# Patient Record
Sex: Female | Born: 1985 | Race: Black or African American | Hispanic: No | Marital: Single | Smoking: Never smoker
Health system: Southern US, Community
[De-identification: ages and names within clinical notes are randomized; demographics above are authoritative.]

## PROBLEM LIST (undated history)

## (undated) DIAGNOSIS — G8929 Other chronic pain: Secondary | ICD-10-CM

## (undated) DIAGNOSIS — K219 Gastro-esophageal reflux disease without esophagitis: Secondary | ICD-10-CM

## (undated) DIAGNOSIS — M545 Low back pain, unspecified: Secondary | ICD-10-CM

## (undated) DIAGNOSIS — R519 Headache, unspecified: Secondary | ICD-10-CM

## (undated) DIAGNOSIS — R51 Headache: Secondary | ICD-10-CM

## (undated) DIAGNOSIS — I10 Essential (primary) hypertension: Secondary | ICD-10-CM

## (undated) DIAGNOSIS — E669 Obesity, unspecified: Secondary | ICD-10-CM

## (undated) DIAGNOSIS — J45909 Unspecified asthma, uncomplicated: Secondary | ICD-10-CM

## (undated) DIAGNOSIS — L97309 Non-pressure chronic ulcer of unspecified ankle with unspecified severity: Secondary | ICD-10-CM

## (undated) DIAGNOSIS — Q210 Ventricular septal defect: Secondary | ICD-10-CM

## (undated) DIAGNOSIS — I8390 Asymptomatic varicose veins of unspecified lower extremity: Secondary | ICD-10-CM

## (undated) HISTORY — PX: TONSILLECTOMY AND ADENOIDECTOMY: SUR1326

## (undated) HISTORY — PX: TYMPANOSTOMY TUBE PLACEMENT: SHX32

## (undated) HISTORY — PX: HERNIA REPAIR: SHX51

## (undated) HISTORY — DX: Non-pressure chronic ulcer of unspecified ankle with unspecified severity: L97.309

---
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
## 2005-07-11 ENCOUNTER — Ambulatory Visit (HOSPITAL_COMMUNITY): Admission: RE | Admit: 2005-07-11 | Discharge: 2005-07-11 | Payer: Self-pay | Admitting: Obstetrics & Gynecology

## 2005-12-10 ENCOUNTER — Emergency Department (HOSPITAL_COMMUNITY): Admission: EM | Admit: 2005-12-10 | Discharge: 2005-12-10 | Payer: Self-pay | Admitting: Emergency Medicine

## 2005-12-11 ENCOUNTER — Emergency Department (HOSPITAL_COMMUNITY): Admission: EM | Admit: 2005-12-11 | Discharge: 2005-12-11 | Payer: Self-pay | Admitting: *Deleted

## 2009-12-06 ENCOUNTER — Emergency Department (HOSPITAL_COMMUNITY): Admission: EM | Admit: 2009-12-06 | Discharge: 2009-12-06 | Payer: Self-pay | Admitting: Family Medicine

## 2010-06-29 LAB — POCT URINALYSIS DIPSTICK
Bilirubin Urine: NEGATIVE
Glucose, UA: NEGATIVE mg/dL
Ketones, ur: NEGATIVE mg/dL

## 2010-06-29 LAB — POCT PREGNANCY, URINE: Preg Test, Ur: NEGATIVE

## 2010-06-29 LAB — URINE CULTURE: Culture  Setup Time: 201108231918

## 2011-01-31 ENCOUNTER — Emergency Department (HOSPITAL_COMMUNITY)
Admission: EM | Admit: 2011-01-31 | Discharge: 2011-01-31 | Disposition: A | Discharge status: Home / Self Care | Payer: Self-pay | Attending: Emergency Medicine | Admitting: Emergency Medicine

## 2011-01-31 DIAGNOSIS — L02419 Cutaneous abscess of limb, unspecified: Secondary | ICD-10-CM | POA: Insufficient documentation

## 2011-01-31 DIAGNOSIS — E669 Obesity, unspecified: Secondary | ICD-10-CM | POA: Insufficient documentation

## 2011-01-31 DIAGNOSIS — M79609 Pain in unspecified limb: Secondary | ICD-10-CM | POA: Insufficient documentation

## 2011-01-31 LAB — GLUCOSE, CAPILLARY: Glucose-Capillary: 87 mg/dL (ref 70–99)

## 2011-07-29 ENCOUNTER — Encounter (HOSPITAL_COMMUNITY): Payer: Self-pay | Admitting: *Deleted

## 2011-07-29 ENCOUNTER — Emergency Department (HOSPITAL_COMMUNITY)
Admission: EM | Admit: 2011-07-29 | Discharge: 2011-07-29 | Disposition: A | Discharge status: Home / Self Care | Payer: Self-pay | Attending: Emergency Medicine | Admitting: Emergency Medicine

## 2011-07-29 DIAGNOSIS — H9201 Otalgia, right ear: Secondary | ICD-10-CM

## 2011-07-29 DIAGNOSIS — J3489 Other specified disorders of nose and nasal sinuses: Secondary | ICD-10-CM | POA: Insufficient documentation

## 2011-07-29 DIAGNOSIS — Z7982 Long term (current) use of aspirin: Secondary | ICD-10-CM | POA: Insufficient documentation

## 2011-07-29 DIAGNOSIS — H9209 Otalgia, unspecified ear: Secondary | ICD-10-CM | POA: Insufficient documentation

## 2011-07-29 HISTORY — DX: Obesity, unspecified: E66.9

## 2011-07-29 MED ORDER — AMOXICILLIN 500 MG PO CAPS: 1000.0000 mg | ORAL_CAPSULE | Freq: Three times a day (TID) | ORAL | Status: AC

## 2011-07-29 MED ORDER — ANTIPYRINE-BENZOCAINE 5.4-1.4 % OT SOLN
3.0000 [drp] | OTIC | Status: DC | PRN
  Administered 2011-07-29: 4 [drp] via OTIC
  Filled 2011-07-29: qty 10

## 2011-07-29 MED ORDER — AMOXICILLIN 500 MG PO CAPS
1000.0000 mg | ORAL_CAPSULE | Freq: Three times a day (TID) | ORAL | Status: DC
  Administered 2011-07-29: 1000 mg via ORAL
  Filled 2011-07-29: qty 2

## 2011-07-29 NOTE — ED Provider Notes (Signed)
Medical screening examination/treatment/procedure(s) were performed by non-physician practitioner and as supervising physician I was immediately available for consultation/collaboration.  Zaniah Titterington, MD 07/29/11 1519 

## 2011-07-29 NOTE — ED Notes (Signed)
Pt reports right ear pain x 1 week

## 2011-07-29 NOTE — ED Provider Notes (Signed)
History     CSN: [id number]  Arrival date & time 07/29/11  1478   First MD Initiated Contact with Patient 07/29/11 [phone]      Chief Complaint  Patient presents with  . Otalgia    (Consider location/radiation/quality/duration/timing/severity/associated sxs/prior treatment) Patient is a 26 y.o. female presenting with ear pain. The history is provided by the patient.  Otalgia This is a new problem. The current episode started more than 2 days ago. There is pain in the right ear. The problem occurs constantly. The problem has been gradually worsening. There has been no fever. Associated symptoms include rhinorrhea. Pertinent negatives include no ear discharge, no hearing loss, no sore throat and no cough.    Past Medical History  Diagnosis Date  . Obesity     History reviewed. No pertinent past surgical history.  History reviewed. No pertinent family history.  History  Substance Use Topics  . Smoking status: Not on file  . Smokeless tobacco: Not on file  . Alcohol Use: No    OB History    Grav Para Term Preterm Abortions TAB SAB Ect Mult Living                  Review of Systems  Constitutional: Negative.  Negative for fever.  HENT: Positive for ear pain and rhinorrhea. Negative for hearing loss, sore throat and ear discharge.   Eyes: Negative.   Respiratory: Negative.  Negative for cough.   Gastrointestinal: Negative.  Negative for nausea.  Neurological: Negative.  Negative for dizziness.    Allergies  Review of patient's allergies indicates no known allergies.  Home Medications   Current Outpatient Rx  Name Route Sig Dispense Refill  . ASPIRIN 325 MG PO TABS Oral Take 325 mg by mouth daily as needed. For pain      BP 103/48  Pulse 112  Temp(Src) 98.4 F (36.9 C) (Oral)  Resp 20  SpO2 99%  Physical Exam  Constitutional: She is oriented to person, place, and time. She appears well-developed and well-nourished.  HENT:  Head: Normocephalic and  atraumatic.  Left Ear: External ear normal.  Mouth/Throat: Oropharynx is clear and moist.       Right TM dull, erythematous prominences with slight bulging. No perforation. External canal normal.  Neck: Normal range of motion.  Pulmonary/Chest: Effort normal.  Neurological: She is alert and oriented to person, place, and time.  Skin: Skin is warm and dry.    ED Course  Procedures (including critical care time)  Labs Reviewed - No data to display No results found.   No diagnosis found. 1. Otalgia, right    MDM          Rodena Medin, PA-C 07/29/11 [phone]

## 2011-07-29 NOTE — Discharge Instructions (Signed)
USE DROPS FOR COMFORT EVERY 2-3 HOURS. TAKE AMOXIL AS DIRECTED AND FOLLOW UP WITH DR. BYERS IF PAIN DOES NOT RESOLVE IN 2-3 DAYS.  Otalgia The most common reason for this in children is an infection of the middle ear. Pain from the middle ear is usually caused by a build-up of fluid and pressure behind the eardrum. Pain from an earache can be sharp, dull, or burning. The pain may be temporary or constant. The middle ear is connected to the nasal passages by a short narrow tube called the Eustachian tube. The Eustachian tube allows fluid to drain out of the middle ear, and helps keep the pressure in your ear equalized. CAUSES  A cold or allergy can block the Eustachian tube with inflammation and the build-up of secretions. This is especially likely in small children, because their Eustachian tube is shorter and more horizontal. When the Eustachian tube closes, the normal flow of fluid from the middle ear is stopped. Fluid can accumulate and cause stuffiness, pain, hearing loss, and an ear infection if germs start growing in this area. SYMPTOMS  The symptoms of an ear infection may include fever, ear pain, fussiness, increased crying, and irritability. Many children will have temporary and minor hearing loss during and right after an ear infection. Permanent hearing loss is rare, but the risk increases the more infections a child has. Other causes of ear pain include retained water in the outer ear canal from swimming and bathing. Ear pain in adults is less likely to be from an ear infection. Ear pain may be referred from other locations. Referred pain may be from the joint between your jaw and the skull. It may also come from a tooth problem or problems in the neck. Other causes of ear pain include:  A foreign body in the ear.   Outer ear infection.   Sinus infections.   Impacted ear wax.   Ear injury.   Arthritis of the jaw or TMJ problems.   Middle ear infection.   Tooth infections.   Sore  throat with pain to the ears.  DIAGNOSIS  Your caregiver can usually make the diagnosis by examining you. Sometimes other special studies, including x-rays and lab work may be necessary. TREATMENT   If antibiotics were prescribed, use them as directed and finish them even if you or your child's symptoms seem to be improved.   Sometimes PE tubes are needed in children. These are little plastic tubes which are put into the eardrum during a simple surgical procedure. They allow fluid to drain easier and allow the pressure in the middle ear to equalize. This helps relieve the ear pain caused by pressure changes.  HOME CARE INSTRUCTIONS   Only take over-the-counter or prescription medicines for pain, discomfort, or fever as directed by your caregiver. DO NOT GIVE CHILDREN ASPIRIN because of the association of Reye's Syndrome in children taking aspirin.   Use a cold pack applied to the outer ear for 15 to 20 minutes, 3 to 4 times per day or as needed may reduce pain. Do not apply ice directly to the skin. You may cause frost bite.   Over-the-counter ear drops used as directed may be effective. Your caregiver may sometimes prescribe ear drops.   Resting in an upright position may help reduce pressure in the middle ear and relieve pain.   Ear pain caused by rapidly descending from high altitudes can be relieved by swallowing or chewing gum. Allowing infants to suck on a bottle  during airplane travel can help.   Do not smoke in the house or near children. If you are unable to quit smoking, smoke outside.   Control allergies.  SEEK IMMEDIATE MEDICAL CARE IF:   You or your child are becoming sicker.   Pain or fever relief is not obtained with medicine.   You or your child's symptoms (pain, fever, or irritability) do not improve within 24 to 48 hours or as instructed.   Severe pain suddenly stops hurting. This may indicate a ruptured eardrum.   You or your children develop new problems such as  severe headaches, stiff neck, difficulty swallowing, or swelling of the face or around the ear.  Document Released: 11/18/2003 Document Revised: 03/22/2011 Document Reviewed: 03/24/2008 [facility] Patient Information 2012 [place], [place].

## 2011-09-25 ENCOUNTER — Encounter (HOSPITAL_COMMUNITY): Payer: Self-pay | Admitting: *Deleted

## 2011-09-25 ENCOUNTER — Emergency Department (HOSPITAL_COMMUNITY)
Admission: EM | Admit: 2011-09-25 | Discharge: 2011-09-25 | Disposition: A | Discharge status: Home / Self Care | Payer: Self-pay | Attending: Emergency Medicine | Admitting: Emergency Medicine

## 2011-09-25 DIAGNOSIS — I83899 Varicose veins of unspecified lower extremities with other complications: Secondary | ICD-10-CM

## 2011-09-25 DIAGNOSIS — I868 Varicose veins of other specified sites: Secondary | ICD-10-CM | POA: Insufficient documentation

## 2011-09-25 NOTE — ED Provider Notes (Signed)
Medical screening examination/treatment/procedure(s) were performed by non-physician practitioner and as supervising physician I was immediately available for consultation/collaboration.   Bailie Christenbury B. Treyvin Glidden, MD 09/25/11 2127 

## 2011-09-25 NOTE — ED Provider Notes (Signed)
History     CSN: [id number]  Arrival date & time 09/25/11  8469   First MD Initiated Contact with Patient 09/25/11 0241      Chief Complaint  Patient presents with  . Foot Injury   HPI  History provided by the patient. Patient is a 26 year old female with morbid obesity who presents with complaints of bleeding from a varicose vein of right foot. Patient states that she may have bumped her foot in the shower and noticed that she began bleeding. Patient states bleeding was squirting out patient tried to put a bandage on it but was concerned for, which blood was coming. Patient called EMS and EMS applied a pressure bandage. Patient denies any pain. She denies any numbness of the foot. She denies any lightheadedness, fatigue, near syncope or shortness of breath. Symptoms are described as moderate. Patient denies any other associated symptoms.    Past Medical History  Diagnosis Date  . Obesity     History reviewed. No pertinent past surgical history.  No family history on file.  History  Substance Use Topics  . Smoking status: Not on file  . Smokeless tobacco: Not on file  . Alcohol Use: No    OB History    Grav Para Term Preterm Abortions TAB SAB Ect Mult Living                  Review of Systems  Constitutional: Negative for fatigue.  Respiratory: Negative for shortness of breath.   Skin:       Bleeding varicose vein  Neurological: Negative for light-headedness.    Allergies  Review of patient's allergies indicates no known allergies.  Home Medications  No current outpatient prescriptions on file.  BP 109/43  Pulse 106  Temp(Src) 98.4 F (36.9 C) (Oral)  Resp 20  SpO2 100%  Physical Exam  Nursing note and vitals reviewed. Constitutional: She appears well-developed and well-nourished. No distress.       Morbid obesity  HENT:  Head: Normocephalic.  Cardiovascular: Normal rate and regular rhythm.   Pulmonary/Chest: Effort normal and breath sounds normal.    Neurological: She is alert.  Skin: Skin is warm and dry.       Multiple varicose veins bilateral feet. There is evidence of bleeding to a small spot on the right lateral foot. At this time there is no active bleeding with good blood clot. Normal dorsal pedal pulses and feet. Normal sensation in toes.  Psychiatric: She has a normal mood and affect. Her behavior is normal.    ED Course  Procedures   Bleeding varicose vein REPAIR Performed by: Angus Seller Authorized by: Angus Seller Consent: Verbal consent obtained. Risks and benefits: risks, benefits and alternatives were discussed Consent given by: patient Patient identity confirmed: provided demographic data Prepped and Draped in normal sterile fashion Wound explored  Location: Lateral right foot  Length: 1 mm   Anesthesia: None    Amount of cleaning: standard, with alcohol   Skin closure: Skin with Dermabond    Patient tolerance: Patient tolerated the procedure well with no immediate complications.      1. Bleeding from varicose vein       MDM  Patient seen and evaluated. Patient no acute distress.        Angus Seller, [place] 09/25/11 2103

## 2011-09-25 NOTE — ED Notes (Signed)
Patient sts she was in the shower tonight when she saw her varicose vein was bleeding on her right foot. Patient sts she called EMS because she was scared. NAD, VSS.

## 2011-09-25 NOTE — Discharge Instructions (Signed)
You were seen and treated for your bleeding varicose vein. This was controlled with pressure. Your providers also placed Dermabond, a sterile glue over the site to prevent further bleeding. This will fall off on its own. It is recommended that you consider wearing compression stockings to help reduce your varicose veins. You should also try to elevate your feet off and to reduce varicose veins. Please followup with your primary care provider.    Bleeding Varicose Veins Varicose veins are veins that have become enlarged and twisted. Valves in the veins help return blood from the leg to the heart. If these valves are damaged, blood flows backwards and backs up into the veins in the leg near the skin. This causes the veins to become larger because of increased pressure within. Sometimes these veins bleed. CAUSES  Factors that can lead to bleeding varicose veins include:  Thinning of the skin that covers the veins. This skin is stretched as the veins enlarge.   Weak and thinning walls of the varicose veins. These thin walls are part of the reason why blood is not flowing normally to the heart.   Having high pressure in the veins. This high pressure occurs because the blood is not flowing freely back up to the heart.   Injury. Even a small injury to a varicose vein can cause bleeding.   Open wounds. A sore may develop near a varicose vein and not heal. This makes bleeding more likely.   Taking medicine that thins the blood. These medicines may include aspirin, anti-inflammatory medicine, and other blood thinners.  SYMPTOMS  If bleeding is on the outside surface of the skin, blood can be seen. Sometimes, the bleeding stays under the skin. If this happens, the blue or purple area will spread beyond the vein. This discoloration may be visible. DIAGNOSIS  To decide if you have a bleeding varicose vein, your caregiver may:  Ask about your symptoms. This will include when you first saw bleeding.    Ask about how long you have had varicose veins and if they cause you problems.   Ask about your overall health.   Ask about possible causes, like recent cuts or if the area near the varicose veins was bumped or injured.   Examine the skin or leg that concerns you. Your caregiver will probably feel the veins.   Order imaging tests. These create detailed pictures of the veins.  TREATMENT  The first goal of treating bleeding varicose veins is to stop the bleeding. Then, the aim is to keep any bleeding from happening again. Treatment will depend on the cause of the bleeding and how bad it is. Ask your caregiver about what would be best for you. Options include:  Raising (elevating) your leg. Lie down with your leg propped up on a pillow or cushion. Your foot should be above your heart.   Applying pressure to the spot that is bleeding. The bleeding should stop in a short time.   Wearing elastic stocking that "compress" your legs (compression stockings). An elastic bandage may do the same thing.   Applying an antibiotic cream on sores that are not healing.   Surgically removing or closing off the bleeding varicose veins.  HOME CARE INSTRUCTIONS   Apply any creams that your caregiver prescribed. Follow the directions carefully.   Wear compression stockings or any special wraps that were prescribed. Make sure you know:   If you should wear them every day.   How long you should wear  them.   If veins were removed or closed, a bandage (dressing) will probably cover the area. Make sure you know:   How often the dressing should be changed.   Whether the area can get wet.   When you can leave the skin uncovered.   Check your skin every day. Look for new sores and signs of bleeding.   To prevent future bleeding:   Use extra care in situations where you could cut your legs. Shaving, for example, or working outside in the garden.   Try to keep your legs elevated as much as possible.  Lie down when you can.  SEEK MEDICAL CARE IF:   You have any questions about how to wear compression stockings or elastic bandages.   Your veins continue to bleed.   Sores develop near your varicose veins.   You have a sore that does not heal or gets bigger.   Pain increases in your leg.   The area around a varicose vein becomes warm, red, or tender to the touch.   You notice a yellowish fluid that smells bad coming from a spot where there was bleeding.   You develop a fever of more than 100.5 F (38.1 C).  SEEK IMMEDIATE MEDICAL CARE IF:   You develop a fever of more than 102 F (38.9 C).  Document Released: 08/19/2008 Document Revised: 03/22/2011 Document Reviewed: 05/29/2010 [facility] Patient Information 2012 [place], [place].

## 2011-09-25 NOTE — ED Notes (Signed)
Per ems pt had a varicose vein in her right foot that started bleeding; presents with dressing placed by ems; no bleeding at present

## 2011-09-25 NOTE — ED Notes (Signed)
Patient given discharge instructions, information, prescriptions, and diet order. Patient states that they adequately understand discharge information given and to return to ED if symptoms return or worsen.    Foot was wrapped with gauze.

## 2011-10-24 ENCOUNTER — Encounter: Payer: Self-pay | Admitting: Vascular Surgery

## 2011-10-25 ENCOUNTER — Ambulatory Visit (INDEPENDENT_AMBULATORY_CARE_PROVIDER_SITE_OTHER): Payer: Medicaid Other | Admitting: Vascular Surgery

## 2011-10-25 ENCOUNTER — Ambulatory Visit (INDEPENDENT_AMBULATORY_CARE_PROVIDER_SITE_OTHER): Payer: Medicaid Other | Admitting: *Deleted

## 2011-10-25 ENCOUNTER — Encounter: Payer: Self-pay | Admitting: Vascular Surgery

## 2011-10-25 VITALS — BP 118/56 | HR 80 | Resp 16 | Ht 66.0 in | Wt 328.0 lb

## 2011-10-25 DIAGNOSIS — E669 Obesity, unspecified: Secondary | ICD-10-CM | POA: Insufficient documentation

## 2011-10-25 DIAGNOSIS — I83899 Varicose veins of unspecified lower extremities with other complications: Secondary | ICD-10-CM

## 2011-10-25 DIAGNOSIS — I83893 Varicose veins of bilateral lower extremities with other complications: Secondary | ICD-10-CM | POA: Insufficient documentation

## 2011-10-25 DIAGNOSIS — I8310 Varicose veins of unspecified lower extremity with inflammation: Secondary | ICD-10-CM

## 2011-10-25 NOTE — Progress Notes (Signed)
[facility] [place] HISTORY AND PHYSICAL   History of Present Illness:  Patient is a 26 y.o. year old female who presents for evaluation of varicose veins.  The patient had a bleeding episode from a varicose veins on her right foot several weeks ago. She was seen in the emergency department for this. She has had no bleeding episodes prior to or since then. She denies any history of DVT. She has never worn compression stockings. She states that varicosities and slowly developed over the last few years. Other medical problems include obesity.  She denies history of diabetes or hypertension. She is scheduled to have followup in the [facility] in the near future. She currently does not have an established are care physician. She is obese and works at [employer] has a tendency to overeat because the food is a readily available. She used to walk some but basically gets minimal exercise at this point.  Past Medical History  Diagnosis Date  . Obesity     History reviewed. No pertinent past surgical history.   Social History History  Substance Use Topics  . Smoking status: Never Smoker   . Smokeless tobacco: Never Used  . Alcohol Use: 0.6 oz/week    1 Glasses of wine per week     occasional    Family History History reviewed. No pertinent family history.  Allergies  No Known Allergies   No current outpatient prescriptions on file.    ROS:   General:  No weight loss, Fever, chills  HEENT: No recent headaches, no nasal bleeding, no visual changes, no sore throat  Neurologic: No dizziness, blackouts, seizures. No recent symptoms of stroke or mini- stroke. No recent episodes of slurred speech, or temporary blindness.  Cardiac: No recent episodes of chest pain/pressure, no shortness of breath at rest.  No shortness of breath with exertion.  Denies history of atrial fibrillation or irregular heartbeat  Vascular: No history of rest pain in feet.  No  history of claudication.  No history of non-healing ulcer, No history of DVT   Pulmonary: No home oxygen, no productive cough, no hemoptysis,  No asthma or wheezing  Musculoskeletal:  [ ]  Arthritis, [ ]  Low back pain,  [ ]  Joint pain  Hematologic:No history of hypercoagulable state.  No history of easy bleeding.  No history of anemia  Gastrointestinal: No hematochezia or melena,  No gastroesophageal reflux, no trouble swallowing  Urinary: [ ]  chronic Kidney disease, [ ]  on HD - [ ]  MWF or [ ]  TTHS, [ ]  Burning with urination, [ ]  Frequent urination, [ ]  Difficulty urinating;   Skin: No rashes  Psychological: No history of anxiety,  No history of depression   Physical Examination  Filed Vitals:   10/25/11 1026  BP: 118/56  Pulse: 80  Resp: 16  Height: 5\' 6"  (1.676 m)  Weight: 328 lb (148.78 kg)  SpO2: 100%    Body mass index is 52.94 kg/(m^2).  General:  Alert and oriented, no acute distress HEENT: Normal Neck: No bruit or JVD Pulmonary: Clear to auscultation bilaterally Cardiac: Regular Rate and Rhythm without murmur Abdomen: Soft, non-tender, non-distended, no mass, no scars, obese Skin: No rash, multiple scattered varicosities medial leg bilaterally and diffusely throughout both feet, no open ulcer, the varicosities range in size from 1-2 mm to up to 3-4 mm Extremity Pulses:  2+ radial, brachial, femoral, difficult to palpate dorsalis pedis, posterior tibial pulses bilaterally due to her obesity Musculoskeletal: No  deformity or edema  Neurologic: Upper and lower extremity motor 5/5 and symmetric  DATA: She had a venous reflux exam today which shows bilateral deep and superficial venous reflux. She had severe reflux of the deep system. She also has severe reflux of the superficial system.   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 Had lengthy discussion with the patient today regarding weight loss. I gave her several ideas for  weight loss as far as dietary recommendations as well as an exercise program of walking 30 minutes daily. She is going to give serious thought to weight loss over the next 6 months to a year and hopefully over the following years as this is a significant contributor to her varicose veins. She will continue followup regarding this with her primary care physician.   PLAN:  She was given a prescription today for bilateral thigh high compression stockings. She will wear these on daily basis. I have scheduled her for a followup visit with my partner Dr. Arbie Cookey in 3 months time to consider laser ablation. I will leave it at his discretion whether or not he thinks this would be beneficial since she does have fairly severe deep reflux as well. Hopefully with weight loss both of these symptoms will improve.  Fabienne Bruns, MD [facility] Office: [phone] Pager: [phone]

## 2011-11-02 NOTE — Procedures (Unsigned)
LOWER EXTREMITY VENOUS REFLUX EXAM  INDICATION:  Bilateral varicose veins with bleeding complications.  EXAM:  Using color-flow imaging and pulse Doppler spectral analysis, the right and left common femoral, femoral, popliteal, posterior tibial, greater and lesser saphenous veins are evaluated.  There is evidence suggesting deep venous insufficiency in the right and left lower extremity.  The right and left saphenofemoral junction is not competent with Reflux of >520milliseconds. The right and left GSV is not competent with Reflux of >56milliseconds with the caliber as described below.   The right and left proximal short saphenous vein demonstrates competency.  GSV Diameter (used if found to be incompetent only)                                           Right     Left Proximal Greater Saphenous Vein           1.56 cm   1.06 cm Proximal-to-mid-thigh                     0.93 cm   0.52 cm Mid thigh                                 0.86 cm   0.55 cm Mid-distal thigh                          Tortuous cm      0.62 cm Distal thigh                              cm        cm Knee                                      Tortuous cm      0.45 cm  IMPRESSION: 1. The right and left greater saphenous vein is not competent with     reflux of >500 milliseconds. 2. The right and left greater saphenous vein is tortuous in the mid to     distal thigh segment. 3. The deep venous system is not competent with Reflux of >500     milliseconds. 4. The right and left lesser saphenous vein is competent.  ___________________________________________ Janetta Hora. Fields, MD  EM/MEDQ  D:  10/26/2011  T:  10/26/2011  Job:  [id number]

## 2011-11-20 ENCOUNTER — Encounter (INDEPENDENT_AMBULATORY_CARE_PROVIDER_SITE_OTHER): Payer: Medicaid Other

## 2011-11-20 DIAGNOSIS — I83893 Varicose veins of bilateral lower extremities with other complications: Secondary | ICD-10-CM

## 2011-12-20 ENCOUNTER — Emergency Department (HOSPITAL_COMMUNITY)
Admission: EM | Admit: 2011-12-20 | Discharge: 2011-12-20 | Disposition: A | Discharge status: Home / Self Care | Payer: Medicaid Other | Source: Home / Self Care | Attending: Family Medicine | Admitting: Family Medicine

## 2011-12-20 ENCOUNTER — Encounter (HOSPITAL_COMMUNITY): Payer: Self-pay | Admitting: Emergency Medicine

## 2011-12-20 DIAGNOSIS — IMO0002 Reserved for concepts with insufficient information to code with codable children: Secondary | ICD-10-CM

## 2011-12-20 DIAGNOSIS — S8392XA Sprain of unspecified site of left knee, initial encounter: Secondary | ICD-10-CM

## 2011-12-20 HISTORY — DX: Asymptomatic varicose veins of unspecified lower extremity: I83.90

## 2011-12-20 MED ORDER — CYCLOBENZAPRINE HCL 10 MG PO TABS: 10.0000 mg | ORAL_TABLET | Freq: Two times a day (BID) | ORAL | Status: DC | PRN

## 2011-12-20 MED ORDER — CYCLOBENZAPRINE HCL 10 MG PO TABS: 10.0000 mg | ORAL_TABLET | Freq: Two times a day (BID) | ORAL | Status: AC | PRN

## 2011-12-20 MED ORDER — TRAMADOL HCL 50 MG PO TABS: 50.0000 mg | ORAL_TABLET | Freq: Four times a day (QID) | ORAL | Status: DC | PRN

## 2011-12-20 MED ORDER — TRAMADOL HCL 50 MG PO TABS: 50.0000 mg | ORAL_TABLET | Freq: Four times a day (QID) | ORAL | Status: AC | PRN

## 2011-12-20 MED ORDER — NAPROXEN 500 MG PO TABS: 500.0000 mg | ORAL_TABLET | Freq: Two times a day (BID) | ORAL | Status: DC

## 2011-12-20 NOTE — ED Notes (Signed)
Patient stepped awkwardly down one step late Saturday night, noticed pain immediately.  Pain has continued .  Left knee is painful.  Slight improvement in pain.

## 2011-12-20 NOTE — ED Provider Notes (Signed)
History     CSN: [id number]  Arrival date & time 12/20/11  [place]   First MD Initiated Contact with Patient 12/20/11 1914      Chief Complaint  Patient presents with  . Knee Pain    (Consider location/radiation/quality/duration/timing/severity/associated sxs/prior treatment) HPI Comments: 26 year old female morbidly obese. Here complaining of left knee pain for the last 4 days. Patient is states that she stepped awkwardly one step while going down stairs in her porch 4 days ago, experiencing pain immediately and lateral lower left knee. Pain mildly improved but persistent and constant. Denies fall or directly hitting her left knee. Has been able to walk and put weight on her left lower extremity but reports discomfort with walking standing, as well as with bending her left knee. No associated redness or swelling. Has taken 1 day or status during this morning with minimal improvement. No fever or chills.   Past Medical History  Diagnosis Date  . Obesity   . Varicose veins     Past Surgical History  Procedure Date  . Tonsillectomy     No family history on file.  History  Substance Use Topics  . Smoking status: Never Smoker   . Smokeless tobacco: Never Used  . Alcohol Use: 0.6 oz/week    1 Glasses of wine per week     occasional    OB History    Grav Para Term Preterm Abortions TAB SAB Ect Mult Living                  Review of Systems  Constitutional: Negative for fever and chills.  Musculoskeletal:       As HPI  Skin: Negative for rash and wound.  All other systems reviewed and are negative.    Allergies  Review of patient's allergies indicates no known allergies.  Home Medications   Current Outpatient Rx  Name Route Sig Dispense Refill  . ACETAMINOPHEN 325 MG PO TABS Oral Take 650 mg by mouth every 6 (six) hours as needed.    . CYCLOBENZAPRINE HCL 10 MG PO TABS Oral Take 1 tablet (10 mg total) by mouth 2 (two) times daily as needed for muscle spasms. 20  tablet 0  . NAPROXEN 500 MG PO TABS Oral Take 1 tablet (500 mg total) by mouth 2 (two) times daily. 30 tablet 0  . TRAMADOL HCL 50 MG PO TABS Oral Take 1 tablet (50 mg total) by mouth every 6 (six) hours as needed for pain. 15 tablet 0    BP 100/53  Pulse 84  Temp 98.8 F (37.1 C) (Oral)  Resp 16  SpO2 100%  Physical Exam  Nursing note and vitals reviewed. Constitutional: She is oriented to person, place, and time.       Morbid obesity  HENT:  Head: Normocephalic and atraumatic.  Cardiovascular: Normal heart sounds.   Pulmonary/Chest: Breath sounds normal.  Musculoskeletal:       Left knee: morbid obese knee. Patient is weight bearing on both extremities. Walking with no assistance and no limping. No obvious deformity no skin erythema or increased temperature. No obvious effusion. No focal tenderness on palpation. Reported tenderness in lateral knee with extreme varus maneuver. Impress no hyperlaxity or unestability.  No obvious baker's cyst or masses.    Neurological: She is alert and oriented to person, place, and time.  Skin:       varicose veins and dry peeling skin in medial lower legs bilaterally. No skin brakes.  ED Course  Procedures (including critical care time)  Labs Reviewed - No data to display No results found.   1. Left knee sprain       MDM  Treated with ace wrap, naproxen, tramadol and Flexeril. Knee strengthening exercises discussed with patient and provided in writing. Encourage floor, sit down exercises or water aerobics for weight management to avoid over stress of low extremity joints. Asked to followup with the orthopedic specialist if persistent pain after 1 week or return earlier if worsening symptoms despite following treatment.        Sharin Grave, MD 12/21/11 1556

## 2012-01-16 ENCOUNTER — Emergency Department (HOSPITAL_COMMUNITY)
Admission: EM | Admit: 2012-01-16 | Discharge: 2012-01-16 | Disposition: A | Discharge status: Home / Self Care | Payer: Medicaid Other | Source: Home / Self Care | Attending: Family Medicine | Admitting: Family Medicine

## 2012-01-16 ENCOUNTER — Encounter (HOSPITAL_COMMUNITY): Payer: Self-pay | Admitting: *Deleted

## 2012-01-16 DIAGNOSIS — R1013 Epigastric pain: Secondary | ICD-10-CM

## 2012-01-16 LAB — POCT URINALYSIS DIP (DEVICE)
Ketones, ur: NEGATIVE mg/dL
Protein, ur: NEGATIVE mg/dL
Specific Gravity, Urine: >=1.03 (ref 1.005–1.030)
pH: 5.5 (ref 5.0–8.0)

## 2012-01-16 MED ORDER — RANITIDINE HCL 150 MG PO CAPS: 150.0000 mg | ORAL_CAPSULE | Freq: Every day | ORAL | Status: DC

## 2012-01-16 MED ORDER — GI COCKTAIL ~~LOC~~
30.0000 mL | Freq: Once | ORAL | Status: AC
  Administered 2012-01-16: 30 mL via ORAL

## 2012-01-16 MED ORDER — GI COCKTAIL ~~LOC~~
ORAL | Status: AC
  Filled 2012-01-16: qty 30

## 2012-01-16 NOTE — ED Notes (Signed)
C/o epigastric pain onset 1330. Ate a salad at work at 1030.  No nausea, vomiting or diarrhea.    She was hot and sweating. She went into th freezer to cool off and pain came on suddenly.  Went to BR to urinate and no relief. She took 3 Advil @ 1400 without relief.  Pain stopped at 1740.

## 2012-01-16 NOTE — ED Notes (Signed)
She wants a pregnancy test just to find out.  States she does not know when her LMP was.  She was on birth control to regulate her periods.  Has been off it for 6-7 years.  Periods are irregular.

## 2012-01-16 NOTE — ED Provider Notes (Signed)
History     CSN: [id number]  Arrival date & time 01/16/12  1712   None     Chief Complaint  Patient presents with  . Abdominal Pain    (Consider location/radiation/quality/duration/timing/severity/associated sxs/prior treatment) Patient is a 26 y.o. female presenting with abdominal pain. The history is provided by the patient.  Abdominal Pain The primary symptoms of the illness include abdominal pain. The primary symptoms of the illness do not include nausea, vomiting or diarrhea. The current episode started 1 to 2 hours ago. The onset of the illness was gradual. The problem has been gradually worsening.  Pregnant now: unknown. The patient has not had a change in bowel habit. Risk factors: none.  Epigastric pain, intermittent, not associated with food intake, reports hx of same.  Admits to heartburn symptoms intermittently.  States no medications taken for same.    Past Medical History  Diagnosis Date  . Obesity   . Varicose veins     Past Surgical History  Procedure Date  . Tonsillectomy     Family History  Problem Relation Age of Onset  . Diabetes Mother     History  Substance Use Topics  . Smoking status: Never Smoker   . Smokeless tobacco: Never Used  . Alcohol Use: 0.6 oz/week    1 Glasses of wine per week     occasional    OB History    Grav Para Term Preterm Abortions TAB SAB Ect Mult Living                  Review of Systems  Constitutional: Negative.   Respiratory: Negative.   Cardiovascular: Negative.   Gastrointestinal: Positive for abdominal pain. Negative for nausea, vomiting and diarrhea.  Genitourinary: Negative.     Allergies  Review of patient's allergies indicates no known allergies.  Home Medications   Current Outpatient Rx  Name Route Sig Dispense Refill  . IBUPROFEN 200 MG PO TABS Oral Take 600 mg by mouth every 6 (six) hours as needed.    Marland Kitchen NAPROXEN 500 MG PO TABS Oral Take 1 tablet (500 mg total) by mouth 2 (two) times daily.  30 tablet 0  . ACETAMINOPHEN 325 MG PO TABS Oral Take 650 mg by mouth every 6 (six) hours as needed.    Marland Kitchen RANITIDINE HCL 150 MG PO CAPS Oral Take 1 capsule (150 mg total) by mouth daily. 60 capsule 2    BP 100/62  Pulse 86  Temp 98.7 F (37.1 C) (Oral)  Resp 16  SpO2 100%  Physical Exam  Nursing note and vitals reviewed. Constitutional: She is oriented to person, place, and time. Vital signs are normal. She appears well-developed and well-nourished. She is active and cooperative.  HENT:  Head: Normocephalic.  Mouth/Throat: Oropharynx is clear and moist.  Eyes: Conjunctivae normal are normal. Pupils are equal, round, and reactive to light. No scleral icterus.  Neck: Trachea normal. Neck supple.  Cardiovascular: Normal rate, regular rhythm, normal heart sounds and intact distal pulses.   No murmur heard. Pulmonary/Chest: Effort normal and breath sounds normal.  Abdominal: Soft. Bowel sounds are normal. There is no tenderness. There is no rebound and no guarding.  Neurological: She is alert and oriented to person, place, and time. No cranial nerve deficit or sensory deficit.  Skin: Skin is warm and dry.  Psychiatric: She has a normal mood and affect. Her speech is normal and behavior is normal. Judgment and thought content normal. Cognition and memory are normal.  ED Course  Procedures (including critical care time)  Labs Reviewed  POCT URINALYSIS DIP (DEVICE) - Abnormal; Notable for the following:    Hgb urine dipstick SMALL (*)     All other components within normal limits  POCT PREGNANCY, URINE  LAB REPORT - SCANNED   No results found.   1. Epigastric pain       MDM  GI cocktail administered in office.  Take medications as prescribed.        Johnsie Kindred, NP 01/21/12 2113

## 2012-01-22 NOTE — ED Provider Notes (Signed)
Medical screening examination/treatment/procedure(s) were performed by resident physician or non-physician practitioner and as supervising physician I was immediately available for consultation/collaboration.   Tremon Sainvil DOUGLAS MD.    Maeola Mchaney D Rufina Kimery, MD 01/22/12 1032 

## 2012-02-04 ENCOUNTER — Encounter: Payer: Self-pay | Admitting: Vascular Surgery

## 2012-02-05 ENCOUNTER — Ambulatory Visit: Payer: Medicaid Other | Admitting: Vascular Surgery

## 2012-02-07 ENCOUNTER — Ambulatory Visit: Payer: Medicaid Other | Admitting: Vascular Surgery

## 2012-02-18 ENCOUNTER — Encounter: Payer: Self-pay | Admitting: Vascular Surgery

## 2012-02-19 ENCOUNTER — Encounter: Payer: Self-pay | Admitting: Vascular Surgery

## 2012-02-19 ENCOUNTER — Ambulatory Visit (INDEPENDENT_AMBULATORY_CARE_PROVIDER_SITE_OTHER): Payer: Medicaid Other | Admitting: Vascular Surgery

## 2012-02-19 ENCOUNTER — Telehealth: Payer: Self-pay | Admitting: *Deleted

## 2012-02-19 VITALS — BP 128/84 | HR 94 | Resp 18 | Ht 66.0 in | Wt 332.2 lb

## 2012-02-19 DIAGNOSIS — I83893 Varicose veins of bilateral lower extremities with other complications: Secondary | ICD-10-CM

## 2012-02-19 NOTE — Progress Notes (Signed)
Problems with Activities of Daily Living Secondary to Leg Pain  1. 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  2. [name] states that any activity that requires prolonged standing (cooking, cleaning, shopping) is difficult due to leg pain.     Failure of  Conservative Therapy:  1. Worn 20-30 mm Hg thigh high compression hose >3 months with no relief of symptoms.  2. Frequently elevates legs-no relief of symptoms  3. Taken Ibuprofen 600 Mg TID with no relief of symptoms.  The patient has today for further discussion of her bilateral venous hypertension. This is worse on the right than on the left. He fortunately has had no further bleeding issues. She does continue to have swelling and discomfort related to her venous hypertension.  Physical exam: Morbidly obese female in no acute distress. She does have marked varicosities from her mid thighs distally bilaterally more so on the right on the left. She does have extensive changes of venous hypertension around both ankles with hemosiderin deposit and announced telangiectasia where she has had bleeding before on the right. I reimaged her right leg with sinusitis does show reflux and a very large saphenous vein extending into these large varicosities.  She reports at the right side is worse than the left. I do not feel that she is a candidate for laser ablation due to her obesity and proximal nature of the level of the saphenous vein disease. She also has large saphenous vein which I feel would make her at increased risk for failure of laser ablation. I feel that appropriate treatment would be ligation stripping of her right great saphenous vein and stab phlebectomy of her multiple to tributary varicosities on her thigh and calf. She understands as an outpatient procedure under general anesthesia. We will schedule this at her convenience

## 2012-02-19 NOTE — Telephone Encounter (Signed)
Left detailed telephone voice message on her personal cell phone (with patient's permission) informing [name] that her current Medicaid coverage only covers family planning (birth control) and would not cover the ligation and stripping/ stab phlebectomy procedures that Dr. Arbie Cookey was recommending.  Suggested that she talk to a Medicaid worker when she reapplies for a new Medicaid card (current one expires 03-25-2012) to apply for broader coverage that would include medical and surgical care.  Requested that she call me back after she receives new Medicaid card if she can obtain coverage for surgical procedures.

## 2012-04-23 ENCOUNTER — Encounter (HOSPITAL_COMMUNITY): Payer: Self-pay | Admitting: Emergency Medicine

## 2012-04-23 ENCOUNTER — Emergency Department (HOSPITAL_COMMUNITY)
Admission: EM | Admit: 2012-04-23 | Discharge: 2012-04-23 | Disposition: A | Discharge status: Home / Self Care | Payer: Self-pay | Attending: Emergency Medicine | Admitting: Emergency Medicine

## 2012-04-23 DIAGNOSIS — R6889 Other general symptoms and signs: Secondary | ICD-10-CM

## 2012-04-23 DIAGNOSIS — R059 Cough, unspecified: Secondary | ICD-10-CM | POA: Insufficient documentation

## 2012-04-23 DIAGNOSIS — J3489 Other specified disorders of nose and nasal sinuses: Secondary | ICD-10-CM | POA: Insufficient documentation

## 2012-04-23 DIAGNOSIS — R05 Cough: Secondary | ICD-10-CM | POA: Insufficient documentation

## 2012-04-23 DIAGNOSIS — R110 Nausea: Secondary | ICD-10-CM | POA: Insufficient documentation

## 2012-04-23 DIAGNOSIS — E669 Obesity, unspecified: Secondary | ICD-10-CM | POA: Insufficient documentation

## 2012-04-23 DIAGNOSIS — J069 Acute upper respiratory infection, unspecified: Secondary | ICD-10-CM | POA: Insufficient documentation

## 2012-04-23 DIAGNOSIS — R52 Pain, unspecified: Secondary | ICD-10-CM | POA: Insufficient documentation

## 2012-04-23 DIAGNOSIS — IMO0001 Reserved for inherently not codable concepts without codable children: Secondary | ICD-10-CM | POA: Insufficient documentation

## 2012-04-23 MED ORDER — OSELTAMIVIR PHOSPHATE 75 MG PO CAPS: 75.0000 mg | ORAL_CAPSULE | Freq: Two times a day (BID) | ORAL | Status: DC

## 2012-04-23 MED ORDER — IBUPROFEN 800 MG PO TABS
800.0000 mg | ORAL_TABLET | Freq: Once | ORAL | Status: AC
  Administered 2012-04-23: 800 mg via ORAL
  Filled 2012-04-23: qty 1

## 2012-04-23 NOTE — ED Provider Notes (Signed)
History   This chart was scribed for Johnnette Gourd, PA-C working with Celene Kras, MD by Charolett Bumpers, ED Scribe. This patient was seen in room WTR6/WTR6 and the patient's care was started at 1733.   CSN: [id number]  Arrival date & time 04/23/12  4098   First MD Initiated Contact with Patient 04/23/12 1733      Chief Complaint  Patient presents with  . Fever  . Cough  . Generalized Body Aches    The history is provided by the patient. No language interpreter was used.   [name] is a 27 y.o. female who presents to the Emergency Department complaining of constant, moderate fever with associated generalized body aches, chills, dry cough, nausea and congestion that started yesterday. She denies any vomiting, diarrhea. She denies any sick contacts and states that she has not had a flu vaccine. She states that she took alka-seltzer plus and mucinex with minimal relief.    Past Medical History  Diagnosis Date  . Obesity   . Varicose veins     Past Surgical History  Procedure Date  . Tonsillectomy     Family History  Problem Relation Age of Onset  . Diabetes Mother     History  Substance Use Topics  . Smoking status: Never Smoker   . Smokeless tobacco: Never Used  . Alcohol Use: 0.6 oz/week    1 Glasses of wine per week     Comment: occasional    OB History    Grav Para Term Preterm Abortions TAB SAB Ect Mult Living                  Review of Systems  Constitutional: Positive for fever and chills.  HENT: Positive for congestion.   Respiratory: Positive for cough.   Gastrointestinal: Positive for nausea. Negative for vomiting and diarrhea.  Musculoskeletal: Positive for myalgias.  All other systems reviewed and are negative.    Allergies  Review of patient's allergies indicates no known allergies.  Home Medications  No current outpatient prescriptions on file.  BP 124/96  Pulse 118  Temp 101.7 F (38.7 C) (Oral)  Resp 20  SpO2 100%  LMP  04/23/2012  Physical Exam  Nursing note and vitals reviewed. Constitutional: She is oriented to person, place, and time. She appears well-developed and well-nourished. No distress.       Obese  HENT:  Head: Normocephalic and atraumatic.  Nose: Mucosal edema present.  Mouth/Throat: Posterior oropharyngeal erythema present. No oropharyngeal exudate or posterior oropharyngeal edema.       Nasal congestion and mucosal edema on exam.   Eyes: Conjunctivae normal and EOM are normal.  Neck: Normal range of motion. Neck supple. No tracheal deviation present.  Cardiovascular: Regular rhythm and normal heart sounds.  Tachycardia present.   Pulmonary/Chest: Effort normal and breath sounds normal. No respiratory distress.  Abdominal: Soft. There is no tenderness.  Musculoskeletal: Normal range of motion.  Lymphadenopathy:    She has no cervical adenopathy.  Neurological: She is alert and oriented to person, place, and time.  Skin: Skin is warm and dry.       Very warm to touch.   Psychiatric: She has a normal mood and affect. Her behavior is normal.    ED Course  Procedures (including critical care time)  DIAGNOSTIC STUDIES: Oxygen Saturation is 100% on room air, normal by my interpretation.    COORDINATION OF CARE:  17:41-Discussed planned course of treatment with the patient including  Tamiflu, alternating Tylenol and Motrin, rest and fluids, who is agreeable at this time.    Labs Reviewed - No data to display No results found.   1. Flu-like symptoms   2. URI (upper respiratory infection)       MDM  27 year old female with flulike symptoms beginning yesterday. She is febrile temperature 101.7. Will give her ibuprofen. Prescribed Tamiflu. Advised rest, increase fluids along with Tylenol and Motrin for fever and body aches. Patient states her understanding of plan and is agreeable. Return precautions discussed. Stable for discharge.   I personally performed the services described  in this documentation, which was scribed in my presence. The recorded information has been reviewed and is accurate.       Trevor Mace, PA-C 04/23/12 1750

## 2012-04-23 NOTE — ED Notes (Signed)
Patient states that since yesterday she has had chills, and aches. The patient also reports that she has a dry cough. She has tried musinex and alkaselter cold OTC. Patient continues to have fever and body aches generalized

## 2012-04-24 NOTE — ED Provider Notes (Signed)
Medical screening examination/treatment/procedure(s) were performed by non-physician practitioner and as supervising physician I was immediately available for consultation/collaboration.  Kaylina Cahue R Jolonda Gomm, MD 04/24/12 0038 

## 2013-07-05 ENCOUNTER — Encounter (HOSPITAL_COMMUNITY): Payer: Self-pay | Admitting: Emergency Medicine

## 2013-07-05 ENCOUNTER — Emergency Department (HOSPITAL_COMMUNITY)
Admission: EM | Admit: 2013-07-05 | Discharge: 2013-07-05 | Disposition: A | Discharge status: Home / Self Care | Payer: BC Managed Care – PPO | Attending: Emergency Medicine | Admitting: Emergency Medicine

## 2013-07-05 ENCOUNTER — Emergency Department (HOSPITAL_COMMUNITY): Payer: BC Managed Care – PPO

## 2013-07-05 DIAGNOSIS — Z8679 Personal history of other diseases of the circulatory system: Secondary | ICD-10-CM | POA: Insufficient documentation

## 2013-07-05 DIAGNOSIS — S61411A Laceration without foreign body of right hand, initial encounter: Secondary | ICD-10-CM

## 2013-07-05 DIAGNOSIS — S61409A Unspecified open wound of unspecified hand, initial encounter: Secondary | ICD-10-CM | POA: Insufficient documentation

## 2013-07-05 DIAGNOSIS — Y929 Unspecified place or not applicable: Secondary | ICD-10-CM | POA: Insufficient documentation

## 2013-07-05 DIAGNOSIS — W260XXA Contact with knife, initial encounter: Secondary | ICD-10-CM | POA: Insufficient documentation

## 2013-07-05 DIAGNOSIS — W261XXA Contact with sword or dagger, initial encounter: Secondary | ICD-10-CM

## 2013-07-05 DIAGNOSIS — Z23 Encounter for immunization: Secondary | ICD-10-CM | POA: Insufficient documentation

## 2013-07-05 DIAGNOSIS — E669 Obesity, unspecified: Secondary | ICD-10-CM | POA: Insufficient documentation

## 2013-07-05 DIAGNOSIS — Y9389 Activity, other specified: Secondary | ICD-10-CM | POA: Insufficient documentation

## 2013-07-05 MED ORDER — TETANUS-DIPHTH-ACELL PERTUSSIS 5-2.5-18.5 LF-MCG/0.5 IM SUSP
0.5000 mL | Freq: Once | INTRAMUSCULAR | Status: AC
  Administered 2013-07-05: 0.5 mL via INTRAMUSCULAR
  Filled 2013-07-05: qty 0.5

## 2013-07-05 NOTE — ED Notes (Signed)
Pt states she was able to start cooking when she was slicing through the plastic of a food container and cut the webbing between the index and thumb of her right hand. Pt states she has unknown last tentaus. Pt able to wiggle all finger distal to injury and cap refill less than 3 CNS intact.

## 2013-07-05 NOTE — ED Notes (Signed)
Laceration rt hand with a knife  Just pta.  Bleeding controlled

## 2013-07-05 NOTE — ED Provider Notes (Signed)
CSN: [id number]     Arrival date & time 07/05/13  0111 History   First MD Initiated Contact with Patient 07/05/13 0208     Chief Complaint  Patient presents with  . Laceration     (Consider location/radiation/quality/duration/timing/severity/associated sxs/prior Treatment) HPI Comments: Patient is a 28 year old female past medical history significant for obesity, varicose veins presented to the emergency room for a laceration to her right hand thenar eminence PTA with a kitchen knife. Patient states she is able to control the bleeding with pressure. She endorses minimal pain to the area. Denies any numbness or tingling or weakness to the hand. Patient is right handed.   Past Medical History  Diagnosis Date  . Obesity   . Varicose veins    Past Surgical History  Procedure Laterality Date  . Tonsillectomy     Family History  Problem Relation Age of Onset  . Diabetes Mother    History  Substance Use Topics  . Smoking status: Never Smoker   . Smokeless tobacco: Never Used  . Alcohol Use: 0.6 oz/week    1 Glasses of wine per week     Comment: occasional   OB History   Grav Para Term Preterm Abortions TAB SAB Ect Mult Living                 Review of Systems  Constitutional: Negative for fever and chills.  Skin: Positive for wound.  All other systems reviewed and are negative.      Allergies  Review of patient's allergies indicates no known allergies.  Home Medications   Current Outpatient Rx  Name  Route  Sig  Dispense  Refill  . ibuprofen (ADVIL,MOTRIN) 200 MG tablet   Oral   Take 400 mg by mouth every 6 (six) hours as needed for headache or moderate pain.          BP 125/69  Pulse 88  Temp(Src) 98.3 F (36.8 C) (Oral)  Resp 18  Ht 5\' 5"  (1.651 m)  Wt 361 lb (163.749 kg)  BMI 60.07 kg/m2  SpO2 100%  LMP 06/01/2013 Physical Exam  Constitutional: She is oriented to person, place, and time. She appears well-developed and well-nourished. No distress.    HENT:  Head: Normocephalic and atraumatic.  Right Ear: External ear normal.  Left Ear: External ear normal.  Nose: Nose normal.  Mouth/Throat: Oropharynx is clear and moist.  Eyes: Conjunctivae are normal.  Neck: Normal range of motion. Neck supple.  Cardiovascular: Normal rate and intact distal pulses.   Pulmonary/Chest: Effort normal.  Abdominal: Soft.  Musculoskeletal: Normal range of motion.       Right wrist: Normal.       Left wrist: Normal.       Right hand: She exhibits tenderness and laceration (4 cm noted ). She exhibits normal range of motion, no bony tenderness, normal two-point discrimination, normal capillary refill, no deformity and no swelling. Normal sensation noted. Normal strength noted.       Left hand: Normal.       Hands: Neurological: She is alert and oriented to person, place, and time.  Skin: Skin is warm and dry. She is not diaphoretic.  Psychiatric: She has a normal mood and affect.    ED Course  Procedures (including critical care time) Medications  Tdap (BOOSTRIX) injection 0.5 mL (0.5 mLs Intramuscular Given 07/05/13 0307)   LACERATION REPAIR Performed by: Jeannetta EllisPIEPENBRINK, Lynann Demetrius L Authorized by: Jeannetta EllisPIEPENBRINK, Kurstin Dimarzo L Consent: Verbal consent obtained. Risks and benefits: risks,  benefits and alternatives were discussed Consent given by: patient Patient identity confirmed: provided demographic data Prepped and Draped in normal sterile fashion Wound explored  Laceration Location: right thenar   Laceration Length: 4 cm  No Foreign Bodies seen or palpated  Anesthesia: local infiltration  Local anesthetic: lidocaine 2% w/o epinephrine  Anesthetic total: 6 ml  Irrigation method: syringe Amount of cleaning: standard  Skin closure: 4-0 Prolene  Number of sutures: 7  Technique: simple interrupted  Patient tolerance: Patient tolerated the procedure well with no immediate complications.  Labs Review Labs Reviewed - No data to  display Imaging Review Dg Hand Complete Right  07/05/2013   CLINICAL DATA:  Laceration to the right hand between the thumb and index finger.  EXAM: RIGHT HAND - COMPLETE 3+ VIEW  COMPARISON:  None.  FINDINGS: No evidence of acute fracture or dislocation. Joint spaces well preserved. Well-preserved bone mineral density. No intrinsic osseous abnormalities. No opaque foreign bodies in the soft tissues. Soft tissue injury between the thumb and index finger.  IMPRESSION: No osseous abnormality. No opaque foreign bodies in the soft tissues.   Electronically Signed   By: Hulan Saas M.D.   On: 07/05/2013 04:21     EKG Interpretation None      MDM   Final diagnoses:  Laceration of right hand    Filed Vitals:   07/05/13 0515  BP: 125/69  Pulse: 88  Temp:   Resp: 18    Afebrile, NAD, non-toxic appearing, AAOx4.  Tdap booster given. Wound cleaning complete with pressure irrigation, bottom of wound visualized, no foreign bodies appreciated. Laceration occurred < 8 hours prior to repair which was well tolerated. Pt has no co morbidities to effect normal wound healing. Patient remains neurovascularly intact on reexamination after suture placement. Discussed suture home care w pt and answered questions. Pt to f-u for wound check and suture removal in 7 days. Pt is hemodynamically stable w no complaints prior to dc. Stable at time of discharge.      Jeannetta Ellis, PA-C 07/05/13 [phone]

## 2013-07-05 NOTE — ED Provider Notes (Signed)
Medical screening examination/treatment/procedure(s) were performed by non-physician practitioner and as supervising physician I was immediately available for consultation/collaboration.   Armandina Iman, MD 07/05/13 0644 

## 2013-07-05 NOTE — Discharge Instructions (Signed)
Please follow up with the urgent care center or here at the ER in 1 week for your suture removal. Please keep the area clean dry and covered. Please read all discharge instructions and return precautions.   Laceration Care, Adult A laceration is a cut or lesion that goes through all layers of the skin and into the tissue just beneath the skin. TREATMENT  Some lacerations may not require closure. Some lacerations may not be able to be closed due to an increased risk of infection. It is important to see your caregiver as soon as possible after an injury to minimize the risk of infection and maximize the opportunity for successful closure. If closure is appropriate, pain medicines may be given, if needed. The wound will be cleaned to help prevent infection. Your caregiver will use stitches (sutures), staples, wound glue (adhesive), or skin adhesive strips to repair the laceration. These tools bring the skin edges together to allow for faster healing and a better cosmetic outcome. However, all wounds will heal with a scar. Once the wound has healed, scarring can be minimized by covering the wound with sunscreen during the day for 1 full year. HOME CARE INSTRUCTIONS  For sutures or staples:  Keep the wound clean and dry.  If you were given a bandage (dressing), you should change it at least once a day. Also, change the dressing if it becomes wet or dirty, or as directed by your caregiver.  Wash the wound with soap and water 2 times a day. Rinse the wound off with water to remove all soap. Pat the wound dry with a clean towel.  After cleaning, apply a thin layer of the antibiotic ointment as recommended by your caregiver. This will help prevent infection and keep the dressing from sticking.  You may shower as usual after the first 24 hours. Do not soak the wound in water until the sutures are removed.  Only take over-the-counter or prescription medicines for pain, discomfort, or fever as directed by  your caregiver.  Get your sutures or staples removed as directed by your caregiver. For skin adhesive strips:  Keep the wound clean and dry.  Do not get the skin adhesive strips wet. You may bathe carefully, using caution to keep the wound dry.  If the wound gets wet, pat it dry with a clean towel.  Skin adhesive strips will fall off on their own. You may trim the strips as the wound heals. Do not remove skin adhesive strips that are still stuck to the wound. They will fall off in time. For wound adhesive:  You may briefly wet your wound in the shower or bath. Do not soak or scrub the wound. Do not swim. Avoid periods of heavy perspiration until the skin adhesive has fallen off on its own. After showering or bathing, gently pat the wound dry with a clean towel.  Do not apply liquid medicine, cream medicine, or ointment medicine to your wound while the skin adhesive is in place. This may loosen the film before your wound is healed.  If a dressing is placed over the wound, be careful not to apply tape directly over the skin adhesive. This may cause the adhesive to be pulled off before the wound is healed.  Avoid prolonged exposure to sunlight or tanning lamps while the skin adhesive is in place. Exposure to ultraviolet light in the first year will darken the scar.  The skin adhesive will usually remain in place for 5 to 10 days,  then naturally fall off the skin. Do not pick at the adhesive film. You may need a tetanus shot if:  You cannot remember when you had your last tetanus shot.  You have never had a tetanus shot. If you get a tetanus shot, your arm may swell, get red, and feel warm to the touch. This is common and not a problem. If you need a tetanus shot and you choose not to have one, there is a rare chance of getting tetanus. Sickness from tetanus can be serious. SEEK MEDICAL CARE IF:   You have redness, swelling, or increasing pain in the wound.  You see a red line that goes  away from the wound.  You have yellowish-white fluid (pus) coming from the wound.  You have a fever.  You notice a bad smell coming from the wound or dressing.  Your wound breaks open before or after sutures have been removed.  You notice something coming out of the wound such as wood or glass.  Your wound is on your hand or foot and you cannot move a finger or toe. SEEK IMMEDIATE MEDICAL CARE IF:   Your pain is not controlled with prescribed medicine.  You have severe swelling around the wound causing pain and numbness or a change in color in your arm, hand, leg, or foot.  Your wound splits open and starts bleeding.  You have worsening numbness, weakness, or loss of function of any joint around or beyond the wound.  You develop painful lumps near the wound or on the skin anywhere on your body. MAKE SURE YOU:   Understand these instructions.  Will watch your condition.  Will get help right away if you are not doing well or get worse. Document Released: 04/02/2005 Document Revised: 06/25/2011 Document Reviewed: 09/26/2010 [facility] Patient Information 2014 [place], [place].

## 2013-07-15 ENCOUNTER — Encounter (HOSPITAL_COMMUNITY): Payer: Self-pay | Admitting: Emergency Medicine

## 2013-07-15 ENCOUNTER — Emergency Department (INDEPENDENT_AMBULATORY_CARE_PROVIDER_SITE_OTHER)
Admission: EM | Admit: 2013-07-15 | Discharge: 2013-07-15 | Disposition: A | Discharge status: Home / Self Care | Payer: BC Managed Care – PPO | Source: Home / Self Care

## 2013-07-15 DIAGNOSIS — Z4802 Encounter for removal of sutures: Secondary | ICD-10-CM

## 2013-07-15 DIAGNOSIS — IMO0002 Reserved for concepts with insufficient information to code with codable children: Secondary | ICD-10-CM

## 2013-07-15 NOTE — Discharge Instructions (Signed)
Keep clean and dry as possible. For signs of infection return sooner. No more ointments.

## 2013-07-15 NOTE — ED Notes (Signed)
Recheck of laceration to right hand. Initial visit 3-22

## 2013-07-15 NOTE — ED Provider Notes (Signed)
CSN: [id number]     Arrival date & time 07/15/13  0935 History   First MD Initiated Contact with Patient 07/15/13 1020     Chief Complaint  Patient presents with  . Wound Check   (Consider location/radiation/quality/duration/timing/severity/associated sxs/prior Treatment) HPI Comments: Here for suture removal 10 d S/P suture repair of laceration to right hand in the ED She has been applying ointment daily and washing daily.  No signs of infection  Patient is a 28 y.o. female presenting with wound check.  Wound Check    Past Medical History  Diagnosis Date  . Obesity   . Varicose veins    Past Surgical History  Procedure Laterality Date  . Tonsillectomy     Family History  Problem Relation Age of Onset  . Diabetes Mother    History  Substance Use Topics  . Smoking status: Never Smoker   . Smokeless tobacco: Never Used  . Alcohol Use: 0.6 oz/week    1 Glasses of wine per week     Comment: occasional   OB History   Grav Para Term Preterm Abortions TAB SAB Ect Mult Living                 Review of Systems  Allergies  Review of patient's allergies indicates no known allergies.  Home Medications   Current Outpatient Rx  Name  Route  Sig  Dispense  Refill  . ibuprofen (ADVIL,MOTRIN) 200 MG tablet   Oral   Take 400 mg by mouth every 6 (six) hours as needed for headache or moderate pain.          BP 115/77  Pulse 79  Temp(Src) 98.2 F (36.8 C) (Oral)  Resp 16  SpO2 100%  LMP 06/01/2013 Physical Exam  ED Course  Procedures (including critical care time) Labs Review Labs Reviewed - No data to display Imaging Review No results found.   MDM   1. Encounter for re-check of laceration wound   2. Visit for suture removal     Upon removing a single suture the wound opened up partly. The other sutures remain. A steristrip was applied to the area of the suture removed. Reapply steristrip if comes off, given to her.  RTO 3 days.    Hayden Rasmussenavid Milla Wahlberg,  NP 07/15/13 1118

## 2013-07-16 NOTE — ED Provider Notes (Signed)
Medical screening examination/treatment/procedure(s) were performed by a resident physician or non-physician practitioner and as the supervising physician I was immediately available for consultation/collaboration.  Lauralei Clouse, MD    Azelea Seguin S Estephanie Hubbs, MD 07/16/13 1644 

## 2013-07-18 ENCOUNTER — Emergency Department (INDEPENDENT_AMBULATORY_CARE_PROVIDER_SITE_OTHER)
Admission: EM | Admit: 2013-07-18 | Discharge: 2013-07-18 | Disposition: A | Discharge status: Home / Self Care | Payer: BC Managed Care – PPO | Source: Home / Self Care | Attending: Family Medicine | Admitting: Family Medicine

## 2013-07-18 ENCOUNTER — Encounter (HOSPITAL_COMMUNITY): Payer: Self-pay | Admitting: Emergency Medicine

## 2013-07-18 DIAGNOSIS — T148XXD Other injury of unspecified body region, subsequent encounter: Secondary | ICD-10-CM

## 2013-07-18 DIAGNOSIS — Z4802 Encounter for removal of sutures: Secondary | ICD-10-CM

## 2013-07-18 DIAGNOSIS — S61409A Unspecified open wound of unspecified hand, initial encounter: Secondary | ICD-10-CM

## 2013-07-18 DIAGNOSIS — S61419A Laceration without foreign body of unspecified hand, initial encounter: Secondary | ICD-10-CM

## 2013-07-18 DIAGNOSIS — T07XXXA Unspecified multiple injuries, initial encounter: Secondary | ICD-10-CM

## 2013-07-18 NOTE — ED Provider Notes (Signed)
CSN: [id number]     Arrival date & time 07/18/13  0908 History   First MD Initiated Contact with Patient 07/18/13 [phone]     Chief Complaint  Patient presents with  . Suture / Staple Removal   (Consider location/radiation/quality/duration/timing/severity/associated sxs/prior Treatment) HPI Comments: [age] year old female presents for removal of sutures from her right hand at the base of her thumb between the thumb and index finger. She had these placed 12 days ago. She followed up here 3 days ago and the wound was not closing properly, she was told to followup in 3 more days. She feels like her hand is getting better although it is still a bit sore. No systemic symptoms. No numbness in the thumb.  Patient is a 28 y.o. female presenting with suture removal.  Suture / Staple Removal    Past Medical History  Diagnosis Date  . Obesity   . Varicose veins    Past Surgical History  Procedure Laterality Date  . Tonsillectomy     Family History  Problem Relation Age of Onset  . Diabetes Mother    History  Substance Use Topics  . Smoking status: Never Smoker   . Smokeless tobacco: Never Used  . Alcohol Use: 0.6 oz/week    1 Glasses of wine per week     Comment: occasional   OB History   Grav Para Term Preterm Abortions TAB SAB Ect Mult Living                 Review of Systems  Skin: Positive for wound.  All other systems reviewed and are negative.    Allergies  Review of patient's allergies indicates no known allergies.  Home Medications   Current Outpatient Rx  Name  Route  Sig  Dispense  Refill  . ibuprofen (ADVIL,MOTRIN) 200 MG tablet   Oral   Take 400 mg by mouth every 6 (six) hours as needed for headache or moderate pain.          BP 127/52  Pulse 83  Temp(Src) 97.8 F (36.6 C) (Oral)  Resp 18  SpO2 100%  LMP 06/01/2013 Physical Exam  Nursing note and vitals reviewed. Constitutional: She is oriented to person, place, and time. Vital signs are normal. She  appears well-developed and well-nourished. No distress.  HENT:  Head: Normocephalic and atraumatic.  Pulmonary/Chest: Effort normal. No respiratory distress.  Musculoskeletal:       Right hand: She exhibits laceration (laceration in the web space between the right thumb and right index finger. Sutures still in place. The skin edges are not adherent, wound does not appear to be healing appropriately).  Neurological: She is alert and oriented to person, place, and time. She has normal strength. Coordination normal.  Skin: Skin is warm and dry. No rash noted. She is not diaphoretic.  Psychiatric: She has a normal mood and affect. Judgment normal.    ED Course  Procedures (including critical care time) Labs Review Labs Reviewed - No data to display Imaging Review No results found.   MDM   1. Laceration of hand with complication   2. Delayed wound healing    Will immobilize the thumb in a thumb spica splint and have her followup in 5 more days. If immobilization does not lead to wound closure, we may need to think about immune deficiencies or causes of delayed wound healing    Graylon GoodZachary H Leylany Nored, PA-C 07/18/13 1007

## 2013-07-18 NOTE — ED Notes (Signed)
Pt is here to have stitches removed; right hand Voices no new concerns... Alert w/no signs of acute distress.

## 2013-07-18 NOTE — ED Provider Notes (Signed)
Medical screening examination/treatment/procedure(s) were performed by resident physician or non-physician practitioner and as supervising physician I was immediately available for consultation/collaboration.   Barkley BrunsKINDL,JAMES DOUGLAS MD.   Linna HoffJames D Kindl, MD 07/18/13 [phone]

## 2013-07-18 NOTE — Discharge Instructions (Signed)
Wear thumb spica splint as much as possible, immobilization will aid in wound healing.  Keep the wound clean, covered, and dry.      Laceration Care, Adult A laceration is a cut or lesion that goes through all layers of the skin and into the tissue just beneath the skin. TREATMENT  Some lacerations may not require closure. Some lacerations may not be able to be closed due to an increased risk of infection. It is important to see your caregiver as soon as possible after an injury to minimize the risk of infection and maximize the opportunity for successful closure. If closure is appropriate, pain medicines may be given, if needed. The wound will be cleaned to help prevent infection. Your caregiver will use stitches (sutures), staples, wound glue (adhesive), or skin adhesive strips to repair the laceration. These tools bring the skin edges together to allow for faster healing and a better cosmetic outcome. However, all wounds will heal with a scar. Once the wound has healed, scarring can be minimized by covering the wound with sunscreen during the day for 1 full year. HOME CARE INSTRUCTIONS  For sutures or staples:  Keep the wound clean and dry.  If you were given a bandage (dressing), you should change it at least once a day. Also, change the dressing if it becomes wet or dirty, or as directed by your caregiver.  Wash the wound with soap and water 2 times a day. Rinse the wound off with water to remove all soap. Pat the wound dry with a clean towel.  After cleaning, apply a thin layer of the antibiotic ointment as recommended by your caregiver. This will help prevent infection and keep the dressing from sticking.  You may shower as usual after the first 24 hours. Do not soak the wound in water until the sutures are removed.  Only take over-the-counter or prescription medicines for pain, discomfort, or fever as directed by your caregiver.  Get your sutures or staples removed as directed by your  caregiver. For skin adhesive strips:  Keep the wound clean and dry.  Do not get the skin adhesive strips wet. You may bathe carefully, using caution to keep the wound dry.  If the wound gets wet, pat it dry with a clean towel.  Skin adhesive strips will fall off on their own. You may trim the strips as the wound heals. Do not remove skin adhesive strips that are still stuck to the wound. They will fall off in time. For wound adhesive:  You may briefly wet your wound in the shower or bath. Do not soak or scrub the wound. Do not swim. Avoid periods of heavy perspiration until the skin adhesive has fallen off on its own. After showering or bathing, gently pat the wound dry with a clean towel.  Do not apply liquid medicine, cream medicine, or ointment medicine to your wound while the skin adhesive is in place. This may loosen the film before your wound is healed.  If a dressing is placed over the wound, be careful not to apply tape directly over the skin adhesive. This may cause the adhesive to be pulled off before the wound is healed.  Avoid prolonged exposure to sunlight or tanning lamps while the skin adhesive is in place. Exposure to ultraviolet light in the first year will darken the scar.  The skin adhesive will usually remain in place for 5 to 10 days, then naturally fall off the skin. Do not pick at the  adhesive film. You may need a tetanus shot if:  You cannot remember when you had your last tetanus shot.  You have never had a tetanus shot. If you get a tetanus shot, your arm may swell, get red, and feel warm to the touch. This is common and not a problem. If you need a tetanus shot and you choose not to have one, there is a rare chance of getting tetanus. Sickness from tetanus can be serious. SEEK MEDICAL CARE IF:   You have redness, swelling, or increasing pain in the wound.  You see a red line that goes away from the wound.  You have yellowish-white fluid (pus) coming from  the wound.  You have a fever.  You notice a bad smell coming from the wound or dressing.  Your wound breaks open before or after sutures have been removed.  You notice something coming out of the wound such as wood or glass.  Your wound is on your hand or foot and you cannot move a finger or toe. SEEK IMMEDIATE MEDICAL CARE IF:   Your pain is not controlled with prescribed medicine.  You have severe swelling around the wound causing pain and numbness or a change in color in your arm, hand, leg, or foot.  Your wound splits open and starts bleeding.  You have worsening numbness, weakness, or loss of function of any joint around or beyond the wound.  You develop painful lumps near the wound or on the skin anywhere on your body. MAKE SURE YOU:   Understand these instructions.  Will watch your condition.  Will get help right away if you are not doing well or get worse. Document Released: 04/02/2005 Document Revised: 06/25/2011 Document Reviewed: 09/26/2010 [facility] Patient Information 2014 [place], [place].  Wound Check Your wound appears healthy today. Your wound will heal gradually over time. Eventually a scar will form that will fade with time. FACTORS THAT AFFECT SCAR FORMATION:  People differ in the severity in which they scar.  Scar severity varies according to location, size, and the traits you inherited from your parents (genetic predisposition).  Irritation to the wound from infection, rubbing, or chemical exposure will increase the amount of scar formation. HOME CARE INSTRUCTIONS   If you were given a dressing, you should change it at least once a day or as instructed by your caregiver. If the bandage sticks, soak it off with a solution of hydrogen peroxide.  If the bandage becomes wet, dirty, or develops a bad smell, change it as soon as possible.  Look for signs of infection.  Only take over-the-counter or prescription medicines for pain, discomfort, or fever  as directed by your caregiver. SEEK IMMEDIATE MEDICAL CARE IF:   You have redness, swelling, or increasing pain in the wound.  You notice pus coming from the wound.  You have a fever.  You notice a bad smell coming from the wound or dressing. Document Released: 01/07/2004 Document Revised: 06/25/2011 Document Reviewed: 04/02/2005 [facility] Patient Information 2014 [place], [place].

## 2013-07-23 ENCOUNTER — Encounter (HOSPITAL_COMMUNITY): Payer: Self-pay | Admitting: Emergency Medicine

## 2013-07-23 ENCOUNTER — Emergency Department (INDEPENDENT_AMBULATORY_CARE_PROVIDER_SITE_OTHER)
Admission: EM | Admit: 2013-07-23 | Discharge: 2013-07-23 | Disposition: A | Discharge status: Home / Self Care | Payer: BC Managed Care – PPO | Source: Home / Self Care | Attending: Emergency Medicine | Admitting: Emergency Medicine

## 2013-07-23 DIAGNOSIS — Z4802 Encounter for removal of sutures: Secondary | ICD-10-CM

## 2013-07-23 NOTE — ED Notes (Signed)
Recheck of sutured wound to right hand.  No new concerns or changes.  Patient concerned about length of time it is taking to heal.  Initial injury 3/22.  Has rechecks of wound 4/1 and 4/4.

## 2013-07-23 NOTE — Discharge Instructions (Signed)
Suture Removal, Care After Refer to this sheet in the next few weeks. These instructions provide you with information on caring for yourself after your procedure. Your health care provider may also give you more specific instructions. Your treatment has been planned according to current medical practices, but problems sometimes occur. Call your health care provider if you have any problems or questions after your procedure. WHAT TO EXPECT AFTER THE PROCEDURE After your stitches (sutures) are removed, it is typical to have the following:  Some discomfort and swelling in the wound area.  Slight redness in the area. HOME CARE INSTRUCTIONS   If you have skin adhesive strips over the wound area, do not take the strips off. They will fall off on their own in a few days. If the strips remain in place after 14 days, you may remove them.  Change any bandages (dressings) at least once a day or as directed by your health care provider. If the bandage sticks, soak it off with warm, soapy water.  Apply cream or ointment only as directed by your health care provider. If using cream or ointment, wash the area with soap and water 2 times a day to remove all the cream or ointment. Rinse off the soap and pat the area dry with a clean towel.  Keep the wound area dry and clean. If the bandage becomes wet or dirty, or if it develops a bad smell, change it as soon as possible.  Continue to protect the wound from injury.  Use sunscreen when out in the sun. New scars become sunburned easily. SEEK MEDICAL CARE IF:  You have increasing redness, swelling, or pain in the wound.  You see pus coming from the wound.  You have a fever.  You notice a bad smell coming from the wound or dressing.  Your wound breaks open (edges not staying together). Document Released: 12/26/2000 Document Revised: 01/21/2013 Document Reviewed: 11/12/2012 [employer] Patient Information 2014 [employer].  

## 2013-07-23 NOTE — ED Provider Notes (Signed)
CSN: [id number]     Arrival date & time 07/23/13  0804 History   First MD Initiated Contact with Patient 07/23/13 0845     Chief Complaint  Patient presents with  . Wound Check   (Consider location/radiation/quality/duration/timing/severity/associated sxs/prior Treatment) HPI Comments: Pt here for f/u laceration to R base of thumb on 3/22. Was seen here 4/1 and 4/4 for recheck of wound. Attempted suture removal but wound was not closed. Previous providers have been concerned for delayed wound healing. Pt denies drainage, pain, redness, pus, swelling.   Patient is a 28 y.o. female presenting with wound check. The history is provided by the patient.  Wound Check This is a new problem. Episode onset: initial laceration 3/22. The problem occurs constantly. The problem has been gradually improving. Nothing aggravates the symptoms. Nothing relieves the symptoms. She has tried nothing for the symptoms.    Past Medical History  Diagnosis Date  . Obesity   . Varicose veins    Past Surgical History  Procedure Laterality Date  . Tonsillectomy     Family History  Problem Relation Age of Onset  . Diabetes Mother    History  Substance Use Topics  . Smoking status: Never Smoker   . Smokeless tobacco: Never Used  . Alcohol Use: 0.6 oz/week    1 Glasses of wine per week     Comment: occasional   OB History   Grav Para Term Preterm Abortions TAB SAB Ect Mult Living                 Review of Systems  Constitutional: Negative for fever and chills.  Skin: Positive for wound.    Allergies  Review of patient's allergies indicates no known allergies.  Home Medications   Current Outpatient Rx  Name  Route  Sig  Dispense  Refill  . ibuprofen (ADVIL,MOTRIN) 200 MG tablet   Oral   Take 400 mg by mouth every 6 (six) hours as needed for headache or moderate pain.          BP 115/77  Pulse 78  Temp(Src) 98.3 F (36.8 C) (Oral)  Resp 18  SpO2 98%  LMP 07/23/2013 Physical Exam   Constitutional: She appears well-developed and well-nourished. No distress.  obese  Skin: Skin is warm, dry and intact. No erythema.  Wound well healed, but not approximated. No drainage, no erythema, no signs infection. Sutures removed.     ED Course  SUTURE REMOVAL Date/Time: 07/23/2013 8:55 AM Performed by: Cathlyn ParsonsKABBE, Veda Arrellano M Authorized by: Leslee HomeKELLER, DAVID C Consent: Verbal consent obtained. Consent given by: patient Patient identity confirmed: verbally with patient Body area: upper extremity Location details: right thumb Sutures Removed: 7 Patient tolerance: Patient tolerated the procedure well with no immediate complications.   (including critical care time) Labs Review Labs Reviewed - No data to display Imaging Review No results found.   MDM   1. Visit for suture removal    Wound healed but not approximated. Will have to heal by secondary intention. Pt to stop using thumb spica.     Cathlyn ParsonsAngela M Hajer Dwyer, NP 07/23/13 [phone]

## 2013-07-23 NOTE — ED Provider Notes (Signed)
Medical screening examination/treatment/procedure(s) were performed by non-physician practitioner and as supervising physician I was immediately available for consultation/collaboration.  Martin Smeal, M.D.  Ludell Zacarias C Aimar Borghi, MD 07/23/13 0937 

## 2013-08-02 DIAGNOSIS — Y939 Activity, unspecified: Secondary | ICD-10-CM | POA: Insufficient documentation

## 2013-08-02 DIAGNOSIS — E669 Obesity, unspecified: Secondary | ICD-10-CM | POA: Insufficient documentation

## 2013-08-02 DIAGNOSIS — R51 Headache: Secondary | ICD-10-CM | POA: Insufficient documentation

## 2013-08-02 DIAGNOSIS — Y929 Unspecified place or not applicable: Secondary | ICD-10-CM | POA: Insufficient documentation

## 2013-08-02 DIAGNOSIS — S058X9A Other injuries of unspecified eye and orbit, initial encounter: Secondary | ICD-10-CM | POA: Insufficient documentation

## 2013-08-02 DIAGNOSIS — X58XXXA Exposure to other specified factors, initial encounter: Secondary | ICD-10-CM | POA: Insufficient documentation

## 2013-08-02 DIAGNOSIS — Z8679 Personal history of other diseases of the circulatory system: Secondary | ICD-10-CM | POA: Insufficient documentation

## 2013-08-03 ENCOUNTER — Emergency Department (HOSPITAL_COMMUNITY)
Admission: EM | Admit: 2013-08-03 | Discharge: 2013-08-03 | Disposition: A | Discharge status: Home / Self Care | Payer: BC Managed Care – PPO | Attending: Emergency Medicine | Admitting: Emergency Medicine

## 2013-08-03 ENCOUNTER — Encounter (HOSPITAL_COMMUNITY): Payer: Self-pay | Admitting: Emergency Medicine

## 2013-08-03 DIAGNOSIS — S0500XA Injury of conjunctiva and corneal abrasion without foreign body, unspecified eye, initial encounter: Secondary | ICD-10-CM

## 2013-08-03 MED ORDER — POLYMYXIN B-TRIMETHOPRIM 10000-0.1 UNIT/ML-% OP SOLN
2.0000 [drp] | OPHTHALMIC | Status: DC
  Administered 2013-08-03: 2 [drp] via OPHTHALMIC
  Filled 2013-08-03: qty 10

## 2013-08-03 MED ORDER — OXYCODONE-ACETAMINOPHEN 5-325 MG PO TABS
1.0000 | ORAL_TABLET | Freq: Once | ORAL | Status: AC
  Administered 2013-08-03: 1 via ORAL
  Filled 2013-08-03: qty 1

## 2013-08-03 MED ORDER — PROPARACAINE HCL 0.5 % OP SOLN
2.0000 [drp] | Freq: Once | OPHTHALMIC | Status: AC
  Administered 2013-08-03: 2 [drp] via OPHTHALMIC
  Filled 2013-08-03: qty 15

## 2013-08-03 NOTE — ED Provider Notes (Signed)
CSN: [id number]     Arrival date & time 08/02/13  2251 History   First MD Initiated Contact with Patient 08/03/13 0216     Chief Complaint  Patient presents with  . Eye Pain  . Headache     (Consider location/radiation/quality/duration/timing/severity/associated sxs/prior Treatment) HPI Comments: Patient presents with 2 days of right eye pain, and headache.  She is unsure, if it's a headache, is causing her eye, to her.  I reviewed and is giving her, headache.  She's taken over-the-counter Tylenol with some relief, but as soon as it wears off.  The pain is back.  Her eye is red, and watery, and sensitive to light  Patient is a 28 y.o. female presenting with eye pain and headaches. The history is provided by the patient.  Eye Pain This is a new problem. The current episode started yesterday. The problem occurs constantly. The problem has been unchanged. Associated symptoms include headaches. Pertinent negatives include no fever, nausea, visual change or weakness. Exacerbated by: Opening eye. She has tried acetaminophen for the symptoms. The treatment provided mild relief.  Headache Associated symptoms: eye pain and photophobia   Associated symptoms: no dizziness, no fever, no nausea and no visual change     Past Medical History  Diagnosis Date  . Obesity   . Varicose veins    Past Surgical History  Procedure Laterality Date  . Tonsillectomy     Family History  Problem Relation Age of Onset  . Diabetes Mother    History  Substance Use Topics  . Smoking status: Never Smoker   . Smokeless tobacco: Never Used  . Alcohol Use: 0.6 oz/week    1 Glasses of wine per week     Comment: occasional   OB History   Grav Para Term Preterm Abortions TAB SAB Ect Mult Living                 Review of Systems  Constitutional: Negative for fever.  Eyes: Positive for photophobia, pain and redness. Negative for visual disturbance.  Gastrointestinal: Negative for nausea.  Neurological:  Positive for headaches. Negative for dizziness and weakness.      Allergies  Review of patient's allergies indicates no known allergies.  Home Medications   Prior to Admission medications   Medication Sig Start Date End Date Taking? Authorizing Provider  acetaminophen (TYLENOL) 500 MG tablet Take 500 mg by mouth every 6 (six) hours as needed (for pain.).   Yes Historical Provider, MD   BP 147/85  Pulse 102  Temp(Src) 98.9 F (37.2 C) (Oral)  Resp 18  SpO2 97%  LMP 07/23/2013 Physical Exam  Vitals reviewed. Constitutional: She is oriented to person, place, and time. She appears well-developed and well-nourished.  HENT:  Head: Normocephalic.  Right Ear: External ear normal.  Left Ear: External ear normal.  Mouth/Throat: Oropharynx is clear and moist.  Eyes: Right eye exhibits no discharge. Right conjunctiva is injected.  Slit lamp exam:      The right eye shows fluorescein uptake.    Dye up take at 6-7 o'clock location   Neck: Normal range of motion.  Cardiovascular: Normal rate.   Pulmonary/Chest: Effort normal.  Musculoskeletal: Normal range of motion.  Lymphadenopathy:    She has no cervical adenopathy.  Neurological: She is alert and oriented to person, place, and time.  Skin: Skin is warm.    ED Course  Procedures (including critical care time) Labs Review Labs Reviewed - No data to display  Imaging Review  No results found.   EKG Interpretation None      MDM  Eye pressure measured X3 with an average of 17 Fluorescein dye uptake.  On a 6 to 7:00 location.  Right eye.  Just distal to the iris We'll start Polytrim ophthalmic ointment, and refer patient to ophthalmology in the morning Final diagnoses:  Corneal abrasion         Arman FilterGail K Mariah Gerstenberger, NP 08/03/13 [id number]

## 2013-08-03 NOTE — ED Notes (Signed)
Pt reports that she has been having a HA with R eye pain for the past 2 days, states she has taken 500mg  Tylenol with relief up to 7 hours, but it will come back. Pt noted to have a red watery R eye. Denies injury, or vision loss. Pt a&o x4, NAD noted at this time.

## 2013-08-03 NOTE — Discharge Instructions (Signed)
Corneal Abrasion The cornea is the clear covering at the front and center of the eye. When looking at the colored portion of the eye (iris), you are looking through the cornea. This very thin tissue is made up of many layers. The surface layer is a single layer of cells (corneal epithelium) and is one of the most sensitive tissues in the body. If a scratch or injury causes the corneal epithelium to come off, it is called a corneal abrasion. If the injury extends to the tissues below the epithelium, the condition is called a corneal ulcer. CAUSES   Scratches.  Trauma.  Foreign body in the eye. Some people have recurrences of abrasions in the area of the original injury even after it has healed (recurrent erosion syndrome). Recurrent erosion syndrome generally improves and goes away with time. SYMPTOMS   Eye pain.  Difficulty or inability to keep the injured eye open.  The eye becomes very sensitive to light.  Recurrent erosions tend to happen suddenly, first thing in the morning, usually after waking up and opening the eye. DIAGNOSIS  Your health care provider can diagnose a corneal abrasion during an eye exam. Dye is usually placed in the eye using a drop or a small paper strip moistened by your tears. When the eye is examined with a special light, the abrasion shows up clearly because of the dye. TREATMENT   Small abrasions may be treated with antibiotic drops or ointment alone.  Usually a pressure patch is specially applied. Pressure patches prevent the eye from blinking, allowing the corneal epithelium to heal. A pressure patch also reduces the amount of pain present in the eye during healing. Most corneal abrasions heal within 2 3 days with no effect on vision. If the abrasion becomes infected and spreads to the deeper tissues of the cornea, a corneal ulcer can result. This is serious because it can cause corneal scarring. Corneal scars interfere with light passing through the cornea  and cause a loss of vision in the involved eye. HOME CARE INSTRUCTIONS  Use medicine or ointment as directed. Only take over-the-counter or prescription medicines for pain, discomfort, or fever as directed by your health care provider.  Do not drive or operate machinery while your eye is patched. Your ability to judge distances is impaired.  If your health care provider has given you a follow-up appointment, it is very important to keep that appointment. Not keeping the appointment could result in a severe eye infection or permanent loss of vision. If there is any problem keeping the appointment, let your health care provider know. SEEK MEDICAL CARE IF:   You have pain, light sensitivity, and a scratchy feeling in one eye or both eyes.  Your pressure patch keeps loosening up, and you can blink your eye under the patch after treatment.  Any kind of discharge develops from the eye after treatment or if the lids stick together in the morning.  You have the same symptoms in the morning as you did with the original abrasion days, weeks, or months after the abrasion healed. MAKE SURE YOU:   Understand these instructions.  Will watch your condition.  Will get help right away if you are not doing well or get worse. Document Released: 03/30/2000 Document Revised: 01/21/2013 Document Reviewed: 12/08/2012 [facility] Patient Information 2014 [employer], [place]. You have  been given Polytrim ophthalmic drops, please use 1 drop to right eye 5 times a day.  You've also been given a referral to Dr. Karleen HampshireSpencer.  Please call first thing in the morning telling him that you have been referred to the emergency department, and we'll make every effort to see, you tomorrow.

## 2013-08-03 NOTE — ED Notes (Signed)
NP at bedside.

## 2013-08-04 ENCOUNTER — Encounter (HOSPITAL_COMMUNITY): Payer: Self-pay | Admitting: Emergency Medicine

## 2013-08-04 ENCOUNTER — Emergency Department (HOSPITAL_COMMUNITY)
Admission: EM | Admit: 2013-08-04 | Discharge: 2013-08-04 | Disposition: A | Discharge status: Home / Self Care | Payer: BC Managed Care – PPO | Attending: Emergency Medicine | Admitting: Emergency Medicine

## 2013-08-04 DIAGNOSIS — E669 Obesity, unspecified: Secondary | ICD-10-CM | POA: Insufficient documentation

## 2013-08-04 DIAGNOSIS — Z8679 Personal history of other diseases of the circulatory system: Secondary | ICD-10-CM | POA: Insufficient documentation

## 2013-08-04 DIAGNOSIS — H209 Unspecified iridocyclitis: Secondary | ICD-10-CM | POA: Insufficient documentation

## 2013-08-04 DIAGNOSIS — H5711 Ocular pain, right eye: Secondary | ICD-10-CM

## 2013-08-04 DIAGNOSIS — R110 Nausea: Secondary | ICD-10-CM | POA: Insufficient documentation

## 2013-08-04 MED ORDER — CYCLOPENTOLATE HCL 1 % OP SOLN: 1.0000 [drp] | Freq: Two times a day (BID) | OPHTHALMIC | Status: DC

## 2013-08-04 MED ORDER — FLUORESCEIN SODIUM 1 MG OP STRP
1.0000 | ORAL_STRIP | Freq: Once | OPHTHALMIC | Status: DC
  Filled 2013-08-04: qty 1

## 2013-08-04 MED ORDER — TETRACAINE HCL 0.5 % OP SOLN
2.0000 [drp] | Freq: Once | OPHTHALMIC | Status: DC
  Filled 2013-08-04: qty 2

## 2013-08-04 NOTE — ED Provider Notes (Signed)
Medical screening examination/treatment/procedure(s) were performed by non-physician practitioner and as supervising physician I was immediately available for consultation/collaboration.   EKG Interpretation None        Allix Blomquist M Elic Vencill, MD 08/04/13 2203 

## 2013-08-04 NOTE — Discharge Instructions (Signed)
Please call and set up an appointment with Dr. Karleen Hampshire, ophthalmology-he is expecting your phone call tomorrow and is requesting to see you in his office Please stop Polytrim drops Please continue prednisolone ophthalmic drops Please take Cyclogyl drops to the right eye-1 drop 2 times a day Please rest and stay hydrated Please apply cool compressions to the eye Please avoid contacts Please wash all clothing and pillow cases to prevent spreading Please continue to monitor symptoms closely and if symptoms are to worsen or change (fever greater than 101, chills, chest pain, shortness of breath, difficulty breathing, swelling to the eye, loss of vision, sudden loss of vision, blurred vision, floaters, dots in vision, swelling to the eye, numbness, tingling, worsening symptoms) please report back to the ED immediately  Iritis Iritis is an inflammation of the colored part of the eye (iris). Other parts at the front of the eye may also be inflamed. The iris is part of the middle layer of the eyeball which is called the uvea or the uveal track. Any part of the uveal track can become inflamed. The other portions of the uveal track are the choroid (the thin membrane under the outer layer of the eye), and the ciliary body (joins the choroid and the iris and produces the fluid in the front of the eye).  It is extremely important to treat iritis early, as it may lead to internal eye damage causing scarring or diseases such as glaucoma. Some people have only one attack of iritis (in one or both eyes) in their lifetime, while others may get it many times. CAUSES Iritis can be associated with many different diseases, but mostly occurs in otherwise healthy people. Examples of diseases that can be associated with iritis include:  Diseases where the body's immune system attacks tissues within your own body (autoimmune diseases).  Infections (tuberculosis, gonorrhea, fungus infections, Lyme disease, infection of the  lining of the heart).  Trauma or injury.  Eye diseases (acute glaucoma and others).  Inflammation from other parts of the uveal track.  Severe eye infections.  Other rare diseases. SYMPTOMS  Eye pain or aching.  Sensitivity to light.  Loss of sight or blurred vision.  Redness of the eye. This is often accompanied by a ring of redness around the outside of the cornea, or clear covering at the front of the eye (ciliary flush).  Excessive tearing of the eye(s).  A small pupil that does not enlarge in the dark and stays smaller than the other eye's pupil.  A whitish area that obscures the lower part of the colored circular iris. Sometimes this is visible when looking at the eye, where the whitish area has a "fluid level" or flat top. This is called a "hypopyon" and is actually pus inside the eye. Since iritis causes the eye to become red, it is often confused with a much less dangerous form of "pink eye" or conjunctivitis. One of the most important symptoms is sensitivity to light. Anytime there is redness, discomfort in the eye(s) and extreme light sensitivity, it is extremely important to see an ophthalmologist as soon as possible. TREATMENT Acute iritis requires prompt medical evaluation by an eye specialist (ophthalmologist.) Treatment depends on the underlying cause but may include:  Corticosteroid eye drops and dilating eye drops. Follow your caregiver's exact instructions on taking and stopping corticosteroid medications (drops or pills).  Occasionally, the iritis will be so severe that it will not respond to commonly used medications. If this happens, it may be  necessary to use steroid injections. The injections are given under the eye's outer surface. Sometimes oral medications are given. The decision on treatment used for iritis is usually made on an individual basis. HOME CARE INSTRUCTIONS Your care giver will give specific instructions regarding the use of eye medications or  other medications. Be certain to follow all instructions in both taking and stopping the medications. SEEK IMMEDIATE MEDICAL CARE IF:  You have redness of one or both eye.  You experience a great deal of light sensitivity.  You have pain or aching in either eye. MAKE SURE YOU:   Understand these instructions.  Will watch your condition.  Will get help right away if you are not doing well or get worse. Document Released: 04/02/2005 Document Revised: 06/25/2011 Document Reviewed: 09/20/2006 [facility] Patient Information 2014 [place], [place].   Emergency Department Resource Guide 1) Find a Doctor and Pay Out of Pocket Although you won't have to find out who is covered by your insurance plan, it is a good idea to ask around and get recommendations. You will then need to call the office and see if the doctor you have chosen will accept you as a new patient and what types of options they offer for patients who are self-pay. Some doctors offer discounts or will set up payment plans for their patients who do not have insurance, but you will need to ask so you aren't surprised when you get to your appointment.  2) Contact Your Local Health Department Not all health departments have doctors that can see patients for sick visits, but many do, so it is worth a call to see if yours does. If you don't know where your local health department is, you can check in your phone book. The CDC also has a tool to help you locate your state's health department, and many state websites also have listings of all of their local health departments.  3) Find a Walk-in Clinic If your illness is not likely to be very severe or complicated, you may want to try a walk in clinic. These are popping up all over the country in pharmacies, drugstores, and shopping centers. They're usually staffed by nurse practitioners or physician assistants that have been trained to treat common illnesses and complaints. They're usually  fairly quick and inexpensive. However, if you have serious medical issues or chronic medical problems, these are probably not your best option.  No Primary Care Doctor: - Call Health Connect at  [phone] - they can help you locate a primary care doctor that  accepts your insurance, provides certain services, etc. - Physician Referral Service- [phone]  Chronic Pain Problems: Organization         Address  Phone   Notes  [facility]  [phone] Patients need to be referred by their primary care doctor.   Medication Assistance: Organization         Address  Phone   Notes  [facility] Medication [facility] [address] [place] [place], [place] [address] [phone] --Must be a resident of [facility] -- Must have NO insurance coverage whatsoever (no Medicaid/ Medicare, etc.) -- The pt. MUST have a primary care doctor that directs their care regularly and follows them in the community   [facility]  [phone]   [employer]  [phone]    Agencies that provide inexpensive medical care: Organization         Address  Phone   Notes  [facility]  [phone]   [facility]    [phone]   [facility] [address] [place], [place] [address] [phone]   [facility] [address] [place] [place] [phone]   Planned Parenthood    [phone]   [facility]    [phone]   [facility]  [address], [place] Phone:  [phone], Fax:  [phone] Hours of Operation:  9 am - 6 pm, M-F.  Also accepts Medicaid/Medicare and self-pay.  [facility]  [address], [place] Phone: [phone], Fax: [phone]. Hours of Operation:  8:30 am - 5:30 pm, M-F.  Also accepts Medicaid and self-pay.  [facility] [place] [address] [place] Phone: [phone]   [facility] [address] Natasha Bence [place], [place] [phone], Ext. [phone] Mondays & Thursdays: 7-9 AM.  First 15 patients are seen on a first come, first serve basis.    Medicaid-accepting [facility] Providers:  Organization         Address  Phone   Notes  [facility] [address],  [phone] Also accepts self-pay patients.  [facility] [address] Laurell Josephs [place], [place]  [phone]   [facility] [address], [place] [phone]   [facility] [address] [place] [phone]   Renaye Rakers [address], [place]   [phone] Only accepts [place] patients after they have their name applied to their card.   Self-Pay (no insurance) in [facility]:  Organization         Address  Phone   Notes  Sickle Cell Patients, [facility] Internal Medicine [address] [place], [place] [phone]   [facility] [address] [place], [place] [phone]   [facility] [place]  [address], [place] [phone]   [facility]/Dr. Osei-Bonsu  [address], [place] or [address], [place] [phone] Phone number for both [place] and [place] locations is the same.  [facility] [address], [place] [phone]   [facility] [address] [place] or [address] [phone]   [facility] [address] [place] [phone], phone; [phone], fax Sees patients 1st and 3rd Saturday of every month.  Must not qualify for public or private insurance (i.e. Medicaid, Medicare, [place] Health Choice, Veterans' Benefits)  Household income should be no more than 200% of the poverty level The clinic cannot treat you if  you are pregnant or think you are pregnant  Sexually transmitted diseases are not treated at the clinic.    Dental Care: Organization         Address  Phone  Notes  [facility] Department of [facility] [address] [place], [place] [phone] Accepts children up to age 61 who are enrolled in [place] or [place] Health Choice; pregnant women with a Medicaid card; and children who have applied for Medicaid or [place] Health Choice, but were declined, whose parents can pay a reduced fee at time of service.  [facility] Department of  [facility] [place]  [address], [place] [phone] Accepts children up to age 28 who are enrolled in Medicaid or [place] Health Choice; pregnant women with a Medicaid card; and children who have applied for Medicaid or [place] Health Choice, but were declined, whose parents can pay a reduced fee at time of service.  [facility]  [address], [place] [phone]) [phone] Patients are seen by appointment only. Walk-ins are not accepted. [facility] will see patients 418 years of age and older. Monday - Tuesday (8am-5pm) Most Wednesdays (8:30-5pm) $30 per visit, cash only  [facility]  [address], [facility] [phone] Patients are seen by appointment only. Walk-ins are not accepted. [facility] will see patients 28 years of age and older. One Wednesday Evening (Monthly: Volunteer Based).  $30 per visit, cash only  [employer] [facility]  [phone] for adults; Children under age 324, call Graduate Pediatric Dentistry at [phone]. Children aged 374-14, please call [phone] to request a pediatric application.  Dental services are provided in all areas of dental care including fillings, crowns and bridges, complete and partial dentures, implants, gum treatment, root canals, and extractions. Preventive care is also provided. Treatment is  provided to both adults and children. Patients are selected via a lottery and there is often a waiting list.   [facility] [address], [place]  [phone] [email or web address]   [facility] [address], [place], [place] [phone], Ext. [phone] Second and Fourth Thursday of each month, opens at 6:30 AM; Clinic ends at 9 AM.  Patients are seen on a first-come first-served basis, and a limited number are seen during each clinic.   [facility]  [address], [place], [place] [phone]) [phone]   Eligibility Requirements You must have lived in [place], [place], or [place] counties for at least the last three months.   You cannot be eligible for state or federal sponsored [place]healthcare insurance, including CIGNAVeterans Administration, [place]icaid, or [employer]icare.   You generally cannot be eligible for healthcare insurance through your employer.    How to apply: Eligibility screenings are held every Tuesday and Wednesday afternoon from 1:00 pm until 4:00 pm. You do not need an appointment for the interview!  [facility] [address], [place], [place] [phone]   [facility]  [phone]   [facility]  [phone]   [facility]  [phone]    Behavioral Health Resources in the Community: Intensive Outpatient Programs Organization         Address  Phone  Notes  [facility] [address], [place], [place] [phone]   [facility] [address], [place], [place] [phone]   [facility] [address], [place], [place]  [phone]   [facility] [address],  [place], [place] [phone] or [phone]   Substance Abuse Resources Organization         Address  Phone  Notes  [facility]  [phone]   [facility]  [phone]   [facility]  [facility]  [phone]   [facility]  [phone]   Residential & Outpatient Substance Abuse Program  [phone]   Psychological Services Organization         Address  Phone  Notes  [facility]  [phone]- [phone]   [facility]  [phone]   [facility] [address], [place] [phone] or [phone]    Mobile Crisis Teams Organization         Address  Phone  Notes  [facility], Mobile Crisis Care Unit  [phone]   [facility]  [address] [place], [place] [phone]   Doristine LocksSharon DeEsch [address] [place] [phone]    Self-Help/Support Groups Organization         Address  Phone             Notes  [facility] [place] - variety of support groups  336- [phone] Call for more information  Narcotics Anonymous (NA), [facility] [address], [place]  2 meetings at this location   Statisticianesidential Treatment Programs Organization         Address  Phone  Notes  [facility] [address],    [place]  [phone]   [facility]  [address], [place] [phone], [place], [place] [phone]   [facility] [address], [place] [phone] Admissions: 8am-3pm M-F  [facility] [address]    [place], [place] [phone]   [facility] [address], [place], [place] [phone]   [facility] [address]  [place], [place] [phone]   [facility] - Intensive Outpatient [address], [place], [place] [phone]   [facility] ([facility] [address]  [place], [place] [phone] or [phone]   [facility] ([facility]) [address] [place], [place] [phone] Accepts Medicaid  [facility] [address]  [place] [phone] Substance Abuse/Addiction Treatment     [facility] Resources Organization         Address  Phone  Notes  [facility]  [phone]) [phone]   Angie FavaJulie Brannon, PhD [address], [address] [place], [place]   [phone] or [phone]   [facility]   [address] [place], [place] [phone]   [facility] [address], [place], [place] [phone] Insurance/Medicaid/sponsorship through [facility]  [facility] [address]                                    [place], [place] [phone]) [phone] Therapy/tele-psych/case  [facility] [address].   [place], [place] [phone]    Dr. Lolly MustacheArfeen  [phone]   [facility]  [employer] [facility] 1) [address], [place] 2) [address], [place] 3)  [address], [place] [phone](336) [phone]) [phone]  [phone]   [facility] Child Abuse Hotline [phone]) [phone] or [phone]) [phone] (After Hours)

## 2013-08-04 NOTE — ED Notes (Signed)
Pt c/o right eye pain, was seen here yesterday for same thing. Dx with pink eye. Pt states " I want scan of my head, because pink eye does not hurt this bad" also states "if my pain is too bad I might walk out because the pain is too unbearable"

## 2013-08-04 NOTE — ED Provider Notes (Signed)
Medical screening examination/treatment/procedure(s) were performed by non-physician practitioner and as supervising physician I was immediately available for consultation/collaboration.   EKG Interpretation None       Azariah Bonura, MD 08/04/13 0810 

## 2013-08-04 NOTE — ED Notes (Signed)
Pt ambulatory to exam room with steady gait.  

## 2013-08-04 NOTE — ED Provider Notes (Signed)
CSN: [id number]     Arrival date & time 08/04/13  1447 History  This chart was scribed for non-physician practitioner, Raymon MuttonMarissa Jamileth Putzier, PA-C working with Lyanne CoKevin M Campos, MD by Greggory StallionKayla Andersen, ED scribe. This patient was seen in room WTR5/WTR5 and the patient's care was started at 4:48 PM.  Chief Complaint  Patient presents with  . Eye Pain    right   The history is provided by the patient. No language interpreter was used.   HPI Comments: [name] is a 28 y.o. female who presents to the Emergency Department complaining of gradual onset, throbbing right eye pain with associated photophobia that started 3 days ago. Denies direct trauma to eye. She states she thought it was a headache at first and took tylenol with temporary relief. Pt was evaluated here yesterday morning for the same, diagnosed with a corneal abrasion and discharged home with Polytrim and ophthalmology follow up. Eye movement worsens the pain. She states the pain medication and eye drops were helping at first but is not anymore. Pt states she went to the ophthalmologist yesterday as well and was told it was pink eye. She states she is also having nausea but denies emesis. Pt is requesting a head CT today. Denies fever, chills, congestion, ear pain, dental pain, throat closing sensation, trouble swallowing, blurred vision, sudden loss of vision, dots or lines in vision, chest pain, SOB, neck pain, numbness, tingling, facial numbness or tingling, dizziness, light headedness.   Past Medical History  Diagnosis Date  . Obesity   . Varicose veins    Past Surgical History  Procedure Laterality Date  . Tonsillectomy     Family History  Problem Relation Age of Onset  . Diabetes Mother    History  Substance Use Topics  . Smoking status: Never Smoker   . Smokeless tobacco: Never Used  . Alcohol Use: 0.6 oz/week    1 Glasses of wine per week     Comment: occasional   OB History   Grav Para Term Preterm Abortions TAB SAB  Ect Mult Living                 Review of Systems  Constitutional: Negative for fever and chills.  HENT: Negative for congestion, dental problem, ear pain and trouble swallowing.   Eyes: Positive for photophobia and pain. Negative for visual disturbance.  Respiratory: Negative for shortness of breath.   Cardiovascular: Negative for chest pain.  Gastrointestinal: Positive for nausea. Negative for vomiting.  Musculoskeletal: Negative for neck pain.  Neurological: Negative for dizziness, facial asymmetry, weakness and numbness.  All other systems reviewed and are negative.  Allergies  Review of patient's allergies indicates no known allergies.  Home Medications   Prior to Admission medications   Medication Sig Start Date End Date Taking? Authorizing Provider  acetaminophen (TYLENOL) 500 MG tablet Take 500 mg by mouth every 6 (six) hours as needed (for pain.).    Historical Provider, MD   BP 116/71  Pulse 99  Temp(Src) 98.6 F (37 C) (Oral)  Resp 18  SpO2 98%  LMP 07/23/2013  Physical Exam  Nursing note and vitals reviewed. Constitutional: She is oriented to person, place, and time. She appears well-developed and well-nourished. No distress.  HENT:  Head: Normocephalic and atraumatic.  Mouth/Throat: Oropharynx is clear and moist. No oropharyngeal exudate.  Negative trismus  Eyes: Conjunctivae, EOM and lids are normal. Pupils are equal, round, and reactive to light. Right eye exhibits no chemosis, no discharge, no  exudate and no hordeolum. No foreign body present in the right eye. Left eye exhibits no chemosis, no discharge, no exudate and no hordeolum. No foreign body present in the left eye. Right conjunctiva is not injected. Right conjunctiva has no hemorrhage. Left conjunctiva is not injected. Left conjunctiva has no hemorrhage. Right eye exhibits normal extraocular motion and no nystagmus. Left eye exhibits normal extraocular motion and no nystagmus. Right pupil is round and  reactive. Left pupil is round and reactive.  Fundoscopic exam:      The right eye shows no arteriolar narrowing, no AV nicking, no exudate, no hemorrhage and no papilledema.       The left eye shows no arteriolar narrowing, no AV nicking, no exudate, no hemorrhage and no papilledema.  Slit lamp exam:      The right eye shows no corneal abrasion, no corneal flare, no corneal ulcer, no foreign body, no hyphema, no hypopyon, no fluorescein uptake and no anterior chamber bulge.       The left eye shows no corneal abrasion, no corneal flare, no corneal ulcer, no foreign body, no hyphema and no hypopyon.  Negative nystagmus Visual fields grossly intact - good peripheral vision Negative swelling to the eye identified Negative cobblestoning  Negative Seidel sign  Neck: Normal range of motion. Neck supple. No tracheal deviation present.  Negative neck stiffness Negative nuchal rigidity Negative cervical lymphadenopathy  Cardiovascular: Normal rate, regular rhythm and normal heart sounds.  Exam reveals no friction rub.   No murmur heard. Pulses:      Radial pulses are 2+ on the right side, and 2+ on the left side.  Pulmonary/Chest: Effort normal. No respiratory distress. She has no wheezes. She has no rales.  Patient is able to speak in full sentences without difficulty Negative stridor Negative use of accessory muscles  Musculoskeletal: Normal range of motion.  Full ROM to upper and lower extremities without difficulty noted, negative ataxia noted.  Lymphadenopathy:    She has no cervical adenopathy.  Neurological: She is alert and oriented to person, place, and time. No cranial nerve deficit. She exhibits normal muscle tone.  Cranial nerves III-XII grossly intact  Skin: Skin is warm and dry.  Psychiatric: She has a normal mood and affect. Her behavior is normal.    ED Course  Procedures (including critical care time)  DIAGNOSTIC STUDIES: Oxygen Saturation is 98% on RA, normal by my  interpretation.    COORDINATION OF CARE: 4:56 PM-Discussed treatment plan which includes slit lamp exam with pt at bedside and pt agreed to plan.   5:48 PM This provider spoke with Dr. Karleen HampshireSpencer, ophthalmologist, who saw and assess patient the other day-reported that he is familiar with this patient. Discussed case, presentation, physical exam. As per physician recommended patient to be placed on Cyclogyl 1% 2 times a day, and to continue prednisolone ophthalmic solution. Recommended that patient call the office tomorrow morning to be seen and assessed.  Labs Review Labs Reviewed - No data to display  Imaging Review No results found.   EKG Interpretation None      MDM   Final diagnoses:  Acute right eye pain  Iritis    Medications  tetracaine (PONTOCAINE) 0.5 % ophthalmic solution 2 drop (not administered)  fluorescein ophthalmic strip 1 strip (not administered)   Filed Vitals:   08/04/13 1517  BP: 116/71  Pulse: 99  Temp: 98.6 F (37 C)  TempSrc: Oral  Resp: 18  SpO2: 98%    I personally  performed the services described in this documentation, which was scribed in my presence. The recorded information has been reviewed and is accurate.  Patient presenting to the ED with right eye pain. Was seen and assessed in ED setting and diagnosed with a corneal abrasion-discharged with pain medications, prednisolone ophthalmic drops, Polytrim. Patient was seen and assessed by ophthalmology yesterday who continued his medication regimen. Unremarkable eye exam. Consensual photophobia identified. Unremarkable funduscopic and slit lamp exam. Negative Seidel sign. Negative uptake.  This provider spoke with ophthalmologist, Dr. Morene Rankins familiar with the patient. Recommended patient be placed on Cyclogyl, discontinue Polytrim. Recommended patient to call the office tomorrow morning to be assessed. Doubt pre-and post-septal cellulitis. Doubt glaucoma. Possible suspicion of viral  conjunctivitis. Cannot rule out iritis. Patient stable, afebrile. Patient not septic appearing. Patient stable for discharge. Discharge patient with Cyclogyl, continue prednisolone drops, discontinue Polytrim-as per recommendations from ophthalmology. Discussed with patient to apply cool compressions. Discussed with patient to closely monitor symptoms and if symptoms are to worsen or change to report back to the ED - strict return instructions given.  Patient agreed to plan of care, understood, all questions answered.   Raymon Mutton, PA-C 08/04/13 2122

## 2013-09-07 ENCOUNTER — Emergency Department (HOSPITAL_COMMUNITY)
Admission: EM | Admit: 2013-09-07 | Discharge: 2013-09-08 | Disposition: A | Discharge status: Home / Self Care | Payer: BC Managed Care – PPO | Attending: Emergency Medicine | Admitting: Emergency Medicine

## 2013-09-07 ENCOUNTER — Encounter (HOSPITAL_COMMUNITY): Payer: Self-pay | Admitting: Emergency Medicine

## 2013-09-07 DIAGNOSIS — Z79899 Other long term (current) drug therapy: Secondary | ICD-10-CM | POA: Insufficient documentation

## 2013-09-07 DIAGNOSIS — E669 Obesity, unspecified: Secondary | ICD-10-CM | POA: Insufficient documentation

## 2013-09-07 DIAGNOSIS — I83899 Varicose veins of unspecified lower extremities with other complications: Secondary | ICD-10-CM

## 2013-09-07 DIAGNOSIS — IMO0002 Reserved for concepts with insufficient information to code with codable children: Secondary | ICD-10-CM | POA: Insufficient documentation

## 2013-09-07 DIAGNOSIS — I83893 Varicose veins of bilateral lower extremities with other complications: Secondary | ICD-10-CM | POA: Insufficient documentation

## 2013-09-07 NOTE — ED Notes (Signed)
Pt complains of bleeding scab on the top of her foot, bleeding controlled at this time

## 2013-09-08 NOTE — ED Provider Notes (Signed)
CSN: [id number]     Arrival date & time 09/07/13  2341 History   First MD Initiated Contact with Patient 09/07/13 2346     Chief Complaint  Patient presents with  . Foot Pain     (Consider location/radiation/quality/duration/timing/severity/associated sxs/prior Treatment) HPI Comments: 28 year old female with a history of varicose veins presents to the emergency department for bleeding from her right foot. Patient states that a scab came loose and started bleeding. Patient states that she was unable to control the bleeding at home and she "panicked and called EMS". Patient states that she has had bleeding from her varicose veins in the past. She intermittently applied pressure with little effect. Patient denies the use of blood thinners as well as associated numbness/tingling, pallor, and weakness.  The history is provided by the patient. No language interpreter was used.    Past Medical History  Diagnosis Date  . Obesity   . Varicose veins    Past Surgical History  Procedure Laterality Date  . Tonsillectomy     Family History  Problem Relation Age of Onset  . Diabetes Mother    History  Substance Use Topics  . Smoking status: Never Smoker   . Smokeless tobacco: Never Used  . Alcohol Use: 0.6 oz/week    1 Glasses of wine per week     Comment: occasional   OB History   Grav Para Term Preterm Abortions TAB SAB Ect Mult Living                 Review of Systems  Constitutional: Negative for fever.  Musculoskeletal: Negative for arthralgias and myalgias.  Skin: Negative for pallor.  Neurological: Negative for weakness and numbness.  Hematological: Bruises/bleeds easily.  All other systems reviewed and are negative.     Allergies  Review of patient's allergies indicates no known allergies.  Home Medications   Prior to Admission medications   Medication Sig Start Date End Date Taking? Authorizing Provider  acetaminophen (TYLENOL) 500 MG tablet Take 500 mg by mouth  every 6 (six) hours as needed (for pain.).    Historical Provider, MD  cyclopentolate (CYCLOGYL) 1 % ophthalmic solution Place 1 drop into the right eye 2 (two) times daily. 08/04/13   Marissa Sciacca, PA-C  prednisoLONE acetate (PRED FORTE) 1 % ophthalmic suspension Place 1 drop into the right eye 2 (two) times daily.    Historical Provider, MD  trimethoprim-polymyxin b (POLYTRIM) ophthalmic solution Place 1 drop into the right eye every 6 (six) hours.    Historical Provider, MD   BP 122/66  Pulse 103  Temp(Src) 98.1 F (36.7 C) (Oral)  Resp 18  SpO2 99%  Physical Exam  Nursing note and vitals reviewed. Constitutional: She is oriented to person, place, and time. She appears well-developed and well-nourished. No distress.  Nontoxic/nonseptic appearing  HENT:  Head: Normocephalic and atraumatic.  Eyes: Conjunctivae and EOM are normal. No scleral icterus.  Neck: Normal range of motion.  Cardiovascular: Normal rate, regular rhythm and intact distal pulses.   DP and PT pulses 2+ in right lower extremity. Capillary refill normal in all digits of right foot.  Pulmonary/Chest: Effort normal. No respiratory distress.  Musculoskeletal: Normal range of motion.       Right foot: She exhibits normal range of motion, no tenderness, no bony tenderness, no swelling, normal capillary refill, no crepitus, no deformity and no laceration.  No active bleeding or palpable, pulsatile bleeding appreciated from dorsal aspect of R foot. Varicose veins appreciated.  Neurological: She is alert and oriented to person, place, and time.  Sensation to light touch intact. Patient able to wiggle all toes of right foot.  Skin: Skin is warm and dry. No rash noted. She is not diaphoretic. No erythema. No pallor.  Psychiatric: She has a normal mood and affect. Her behavior is normal.    ED Course  Procedures (including critical care time) Labs Review Labs Reviewed - No data to display  Imaging Review No results  found.   EKG Interpretation None      LACERATION REPAIR Performed by: Antony MaduraKelly Ziva Nunziata Authorized by: Antony MaduraKelly Jahmil Macleod Consent: Verbal consent obtained. Risks and benefits: risks, benefits and alternatives were discussed Consent given by: patient Patient identity confirmed: provided demographic data Prepped and Draped in normal sterile fashion Wound explored  Laceration Location: dorsum R foot  Laceration Length: punctate  No Foreign Bodies seen or palpated  Anesthesia: none  Local anesthetic: none  Anesthetic total: n/a  Irrigation method: syringe Amount of cleaning: standard  Skin closure: Dermabond  Number of sutures: n/a  Technique: simple  Patient tolerance: Patient tolerated the procedure well with no immediate complications.  MDM   Final diagnoses:  Bleeding from varicose veins of lower extremity    Patient presents for bleeding from her varicose veins or R foot. No active bleeding on arrival. No palpable, pulsatile bleeding appreciated. Patient denies the use of blood thinners. She is neurovascularly intact on exam. Dermabond applied over the area where bleeding originated as well as pressure dressing. Advised to continue use of pressure dressing for 24 hours with change of dressing as needed after this time. Primary care followup advised and return precautions provided. Patient agreeable to plan with no unaddressed concerns.  Filed Vitals:   09/07/13 2347  BP: 122/66  Pulse: 103  Temp: 98.1 F (36.7 C)  TempSrc: Oral  Resp: 18  SpO2: 99%       Antony MaduraKelly Dream Harman, PA-C 09/10/13 1409

## 2013-09-08 NOTE — Discharge Instructions (Signed)
Bleeding Varicose Veins  Varicose veins are veins that have become enlarged and twisted. Valves in the veins help return blood from the leg to the heart. If these valves are damaged, blood flows backwards and backs up into the veins in the leg near the skin. This causes the veins to become larger because of increased pressure within. Sometimes these veins bleed.  CAUSES   Factors that can lead to bleeding varicose veins include:   Thinning of the skin that covers the veins. This skin is stretched as the veins enlarge.   Weak and thinning walls of the varicose veins. These thin walls are part of the reason why blood is not flowing normally to the heart.   Having high pressure in the veins. This high pressure occurs because the blood is not flowing freely back up to the heart.   Injury. Even a small injury to a varicose vein can cause bleeding.   Open wounds. A sore may develop near a varicose vein and not heal. This makes bleeding more likely.   Taking medicine that thins the blood. These medicines may include aspirin, anti-inflammatory medicine, and other blood thinners.  SYMPTOMS   If bleeding is on the outside surface of the skin, blood can be seen. Sometimes, the bleeding stays under the skin. If this happens, the blue or purple area will spread beyond the vein. This discoloration may be visible.  DIAGNOSIS   To decide if you have a bleeding varicose vein, your caregiver may:   Ask about your symptoms. This will include when you first saw bleeding.   Ask about how long you have had varicose veins and if they cause you problems.   Ask about your overall health.   Ask about possible causes, like recent cuts or if the area near the varicose veins was bumped or injured.   Examine the skin or leg that concerns you. Your caregiver will probably feel the veins.   Order imaging tests. These create detailed pictures of the veins.  TREATMENT   The first goal of treating bleeding varicose veins is to stop the  bleeding. Then, the aim is to keep any bleeding from happening again. Treatment will depend on the cause of the bleeding and how bad it is. Ask your caregiver about what would be best for you. Options include:   Raising (elevating) your leg. Lie down with your leg propped up on a pillow or cushion. Your foot should be above your heart.   Applying pressure to the spot that is bleeding. The bleeding should stop in a short time.   Wearing elastic stocking that "compress" your legs (compression stockings). An elastic bandage may do the same thing.   Applying an antibiotic cream on sores that are not healing.   Surgically removing or closing off the bleeding varicose veins.  HOME CARE INSTRUCTIONS    Apply any creams that your caregiver prescribed. Follow the directions carefully.   Wear compression stockings or any special wraps that were prescribed. Make sure you know:   If you should wear them every day.   How long you should wear them.   If veins were removed or closed, a bandage (dressing) will probably cover the area. Make sure you know:   How often the dressing should be changed.   Whether the area can get wet.   When you can leave the skin uncovered.   Check your skin every day. Look for new sores and signs of bleeding.   To   prevent future bleeding:   Use extra care in situations where you could cut your legs. Shaving, for example, or working outside in the garden.   Try to keep your legs elevated as much as possible. Lie down when you can.  SEEK MEDICAL CARE IF:    You have any questions about how to wear compression stockings or elastic bandages.   Your veins continue to bleed.   Sores develop near your varicose veins.   You have a sore that does not heal or gets bigger.   Pain increases in your leg.   The area around a varicose vein becomes warm, red, or tender to the touch.   You notice a yellowish fluid that smells bad coming from a spot where there was bleeding.   You develop a  fever of more than 100.5 F (38.1 C).  SEEK IMMEDIATE MEDICAL CARE IF:    You develop a fever of more than 102 F (38.9 C).  Document Released: 08/19/2008 Document Revised: 06/25/2011 Document Reviewed: 05/29/2010  [employer] Patient Information 2014 [employer]

## 2013-09-11 NOTE — ED Provider Notes (Signed)
Medical screening examination/treatment/procedure(s) were performed by non-physician practitioner and as supervising physician I was immediately available for consultation/collaboration.   EKG Interpretation None       Jerika Wales, MD 09/11/13 0912 

## 2013-10-08 ENCOUNTER — Ambulatory Visit (INDEPENDENT_AMBULATORY_CARE_PROVIDER_SITE_OTHER): Payer: BC Managed Care – PPO | Admitting: Family Medicine

## 2013-10-08 ENCOUNTER — Encounter: Payer: Self-pay | Admitting: Family Medicine

## 2013-10-08 VITALS — BP 141/86 | HR 104 | Temp 97.8°F | Resp 20 | Wt 358.0 lb

## 2013-10-08 DIAGNOSIS — IMO0001 Reserved for inherently not codable concepts without codable children: Secondary | ICD-10-CM | POA: Insufficient documentation

## 2013-10-08 DIAGNOSIS — Z8669 Personal history of other diseases of the nervous system and sense organs: Secondary | ICD-10-CM

## 2013-10-08 DIAGNOSIS — N921 Excessive and frequent menstruation with irregular cycle: Secondary | ICD-10-CM

## 2013-10-08 DIAGNOSIS — R030 Elevated blood-pressure reading, without diagnosis of hypertension: Secondary | ICD-10-CM

## 2013-10-08 DIAGNOSIS — I83893 Varicose veins of bilateral lower extremities with other complications: Secondary | ICD-10-CM

## 2013-10-08 DIAGNOSIS — E669 Obesity, unspecified: Secondary | ICD-10-CM

## 2013-10-08 DIAGNOSIS — N920 Excessive and frequent menstruation with regular cycle: Secondary | ICD-10-CM | POA: Insufficient documentation

## 2013-10-08 HISTORY — DX: Personal history of other diseases of the nervous system and sense organs: Z86.69

## 2013-10-08 LAB — CBC WITH DIFFERENTIAL/PLATELET
BASOS PCT: 1 % (ref 0–1)
Basophils Absolute: 0.1 10*3/uL (ref 0.0–0.1)
Eosinophils Absolute: 0.1 10*3/uL (ref 0.0–0.7)
Eosinophils Relative: 2 % (ref 0–5)
HCT: 35.6 % — ABNORMAL LOW (ref 36.0–46.0)
Hemoglobin: 11.6 g/dL — ABNORMAL LOW (ref 12.0–15.0)
Lymphocytes Relative: 31 % (ref 12–46)
Lymphs Abs: 2 10*3/uL (ref 0.7–4.0)
MCH: 28.5 pg (ref 26.0–34.0)
MCHC: 32.6 g/dL (ref 30.0–36.0)
MCV: 87.5 fL (ref 78.0–100.0)
Monocytes Absolute: 0.7 10*3/uL (ref 0.1–1.0)
Monocytes Relative: 10 % (ref 3–12)
NEUTROS PCT: 56 % (ref 43–77)
Neutro Abs: 3.6 10*3/uL (ref 1.7–7.7)
Platelets: 277 10*3/uL (ref 150–400)
RBC: 4.07 MIL/uL (ref 3.87–5.11)
RDW: 13.3 % (ref 11.5–15.5)
WBC: 6.5 10*3/uL (ref 4.0–10.5)

## 2013-10-08 LAB — BASIC METABOLIC PANEL
BUN: 14 mg/dL (ref 6–23)
CALCIUM: 8.9 mg/dL (ref 8.4–10.5)
CO2: 29 mEq/L (ref 19–32)
Chloride: 104 mEq/L (ref 96–112)
Creat: 0.89 mg/dL (ref 0.50–1.10)
GLUCOSE: 76 mg/dL (ref 70–99)
Potassium: 3.8 mEq/L (ref 3.5–5.3)
SODIUM: 138 meq/L (ref 135–145)

## 2013-10-08 LAB — POCT GLYCOSYLATED HEMOGLOBIN (HGB A1C): Hemoglobin A1C: 5.5

## 2013-10-08 NOTE — Patient Instructions (Signed)
Low-Sodium Eating Plan Sodium raises blood pressure and causes water to be held in the body. Getting less sodium from food will help lower your blood pressure, reduce any swelling, and protect your heart, liver, and kidneys. We get sodium by adding salt (sodium chloride) to food. Most of our sodium comes from canned, boxed, and frozen foods. Restaurant foods, fast foods, and pizza are also very high in sodium. Even if you take medicine to lower your blood pressure or to reduce fluid in your body, getting less sodium from your food is important. WHAT IS MY PLAN? Most people should limit their sodium intake to 2,300 mg a day. Your health care provider recommends that you limit your sodium intake to __________ a day.  WHAT DO I NEED TO KNOW ABOUT THIS EATING PLAN? For the low-sodium eating plan, you will follow these general guidelines:  Choose foods with a % Daily Value for sodium of less than 5% (as listed on the food label).   Use salt-free seasonings or herbs instead of table salt or sea salt.   Check with your health care provider or pharmacist before using salt substitutes.   Eat fresh foods.  Eat more vegetables and fruits.  Limit canned vegetables. If you do use them, rinse them well to decrease the sodium.   Limit cheese to 1 oz (28 g) per day.   Eat lower-sodium products, often labeled as "lower sodium" or "no salt added."  Avoid foods that contain monosodium glutamate (MSG). MSG is sometimes added to [place] food and some canned foods.  Check food labels (Nutrition Facts labels) on foods to learn how much sodium is in one serving.  Eat more home-cooked food and less restaurant, buffet, and fast food.  When eating at a restaurant, ask that your food be prepared with less salt or none, if possible.  HOW DO I READ FOOD LABELS FOR SODIUM INFORMATION? The Nutrition Facts label lists the amount of sodium in one serving of the food. If you eat more than one serving, you must  multiply the listed amount of sodium by the number of servings. Food labels may also identify foods as:  Sodium free--Less than 5 mg in a serving.  Very low sodium--35 mg or less in a serving.  Low sodium--140 mg or less in a serving.  Light in sodium--50% less sodium in a serving. For example, if a food that usually has 300 mg of sodium is changed to become light in sodium, it will have 150 mg of sodium.  Reduced sodium--25% less sodium in a serving. For example, if a food that usually has 400 mg of sodium is changed to reduced sodium, it will have 300 mg of sodium. WHAT FOODS CAN I EAT? Grains Low-sodium cereals, including oats, puffed wheat and rice, and shredded wheat cereals. Low-sodium crackers. Unsalted rice and pasta. Lower-sodium bread.  Vegetables Frozen or fresh vegetables. Low-sodium or reduced-sodium canned vegetables. Low-sodium or reduced-sodium tomato sauce and paste. Low-sodium or reduced-sodium tomato and vegetable juices.  Fruits Fresh, frozen, and canned fruit. Fruit juice.  Meat and Other Protein Products Low-sodium canned tuna and salmon. Fresh or frozen meat, poultry, seafood, and fish. Lamb. Unsalted nuts. Dried beans, peas, and lentils without added salt. Unsalted canned beans. Homemade soups without salt. Eggs.  Dairy Milk. Soy milk. Ricotta cheese. Low-sodium or reduced-sodium cheeses. Yogurt.  Condiments Fresh and dried herbs and spices. Salt-free seasonings. Onion and garlic powders. Low-sodium varieties of mustard and ketchup. Lemon juice.  Fats and Oils  Reduced-sodium salad dressings. Unsalted butter.  Other Unsalted popcorn and pretzels.  The items listed above may not be a complete list of recommended foods or beverages. Contact your dietitian for more options. WHAT FOODS ARE NOT RECOMMENDED? Grains Instant hot cereals. Bread stuffing, pancake, and biscuit mixes. Croutons. Seasoned rice or pasta mixes. Noodle soup cups. Boxed or frozen  macaroni and cheese. Self-rising flour. Regular salted crackers. Vegetables Regular canned vegetables. Regular canned tomato sauce and paste. Regular tomato and vegetable juices. Frozen vegetables in sauces. Salted french fries. Olives. Pickles. Relishes. Sauerkraut. Salsa. Meat andRosita Fire Other Protein Products Salted, canned, smoked, spiced, or pickled meats, seafood, or fish. Bacon, ham, sausage, hot dogs, corned beef, chipped beef, and packaged luncheon meats. Salt pork. Jerky. Pickled herring. Anchovies, regular canned tuna, and sardines. Salted nuts. Dairy Processed cheese and cheese spreads. Cheese curds. Blue cheese and cottage cheese. Buttermilk.  Condiments Onion and garlic salt, seasoned salt, table salt, and sea salt. Canned and packaged gravies. Worcestershire sauce. Tartar sauce. Barbecue sauce. Teriyaki sauce. Soy sauce, including reduced sodium. Steak sauce. Fish sauce. [name] sauce. Cocktail sauce. Horseradish. Regular ketchup and mustard. Meat flavorings and tenderizers. Bouillon cubes. Hot sauce. Tabasco sauce. Marinades. Taco seasonings. Relishes. Fats and Oils Regular salad dressings. Salted butter. Margarine. Ghee. Bacon fat.  Other Potato and tortilla chips. Corn chips and puffs. Salted popcorn and pretzels. Canned or dried soups. Pizza. Frozen entrees and pot pies.  The items listed above may not be a complete list of foods and beverages to avoid. Contact your dietitian for more information. Document Released: 09/22/2001 Document Revised: 04/07/2013 Document Reviewed: 02/04/2013 [facility] Patient Information 2015 [place], [place]. This information is not intended to replace advice given to you by your health care provider. Make sure you discuss any questions you have with your health care provider.   Please start to watch the salt in your diet. - return in 1 -2 weeks for nurse visit to check your Blood pessure.  - Depending on those results I may need to start you on  medications.  - take your BP at the drug store once as well and report that to the nurse on your visit.   Please schedule your PAP smear as soon as possible. - We can discuss contraception at that time as well that will help you get your periods more regular.   I will call you with results of your labs today.  It was a pleasure meeting you.

## 2013-10-08 NOTE — Assessment & Plan Note (Addendum)
[facility] with Dr. Arbie CookeyEarly. She states they wanted to perform a surgery, but she did not have insurance at that time. She would like to return to have treatment completed now that she is insured.  She is to call and make an appointment. I am uncertain if she will need a referral since she was an established patient prior. Will place referral anyway.

## 2013-10-08 NOTE — Assessment & Plan Note (Addendum)
Pt to make an appointment for PAP and contraceptive counseling as soon as possible. She has not had PAP for "years".  CBC, BMp andTSh today

## 2013-10-08 NOTE — Progress Notes (Signed)
   Subjective:    Patient ID: [name], female    DOB: 09/13/1985, 28 y.o.   MRN: [id number]  HPI [name] is a 28 y.o. AAF [facility]   PMH:  Irregular menstrual cycles: Patient reports that her menses has been irregular since she was about [age] or 28 years old. They told her that time that she needed to lose weight. Patient was started on Depo-Provera to help control her periods and make them more regular. She states her period doesn't come on approximately every 30 days however sometimes a slight and sometimes it's extremely heavy. Patient states her last menstrual period was at the end of May. She has never been pregnant.  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 She was seen with Dr. Arbie CookeyEarly at the vascular Center, but was unable to go for treatment d/t being uninsured at that time. She states her varicosities are painful and present in both her lower extremities. She has been seen in the ED for a bleeding/painful varicosity in the past. She denies history of blood clots. 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  She had a venous reflux exam which showed bilateral deep and superficial venous reflux. She had severe reflux of the deep system and superficial system in 2013. She was given compression stockings and advised on weight loss. Laser ablation was considered but pt was unable to afford treatment.   Iritis H/O: Pt was seen in the ED and with Dr. Karleen HampshireSpencer (ophthalmologist) in April 2015 for iritis?. Pt states st first they thought she had pink eye. When she returned to the ED she was again sent to ophtho which told her it was iritis. Uncertain of course at this point, she has been seen by eye specialist twice.   Health maintenance: pt is over due for a PAP smear. She states she has ot had one in "years". She does not recall an abnormal PAP in the past. She is UTD with tetanus and flu vaccine.  No current  medications No surgical history  FH: hypertension in mother.  Social: Lives alone. 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 Feels safe in her relationships. Has her own transportation. Never used tobacco. Occasional alcohol. Does not exercise regularly. Denies recreational drug use. Neg depression screen  Review of Systems Per HPI    Objective:   Physical Exam BP 141/86  Pulse 104  Temp(Src) 97.8 F (36.6 C) (Oral)  Resp 20  Wt 358 lb (162.388 kg)  SpO2 98%  LMP 09/12/2013 Gen: Pleasant, morbidly African American female. No acute distress, nontoxic in appearance. HEENT: AT. [place]. Bilateral TM visualized and normal in appearance. Bilateral eyes without injections or icterus. MMM. Bilateral nares with mild erythema no swelling. Throat without erythema or exudates.  CV: RRR. No murmurs clicks gallops or rubs Chest: CTAB, no wheeze or crackles Abd: Soft. Obese. NTND. BS present. No Masses palpated.  Ext: No erythema. No edema. Hyperpigmentation right lower extremity with varicosities. Left lower extremity with fair cost disease. Equal pulses bilaterally Skin: No rashes, purpura or petechiae.  Neuro:Normal gait. PERLA. EOMi. Alert. Cranial nerves II through XII intact bilaterally. Psych: Normal affect, dressed, demeanor and speech

## 2013-10-08 NOTE — Assessment & Plan Note (Signed)
Iritis history without work up or known cause.  Will need evaluation and likely opthalmology referral. Pt instructed to make an appointment if she experiences any further eye pain. She has not had pain since the ED gave her eye drops.

## 2013-10-08 NOTE — Assessment & Plan Note (Signed)
-   Pt with elevated BP today. No history of hypertension.  - She is to watch her salt intake. AVS wit low sodium instructions given. - In addition she is to take her BP at a pharmacy/store twice over the next week. She is also to make Nurse visit for BP check in 2 weeks. - Will make decision on management after nurse check.  - F/U 2 weeks

## 2013-10-09 ENCOUNTER — Encounter: Payer: Self-pay | Admitting: Family Medicine

## 2013-10-09 LAB — TSH: TSH: 1.019 u[IU]/mL (ref 0.350–4.500)

## 2013-10-22 ENCOUNTER — Ambulatory Visit (INDEPENDENT_AMBULATORY_CARE_PROVIDER_SITE_OTHER): Payer: BC Managed Care – PPO | Admitting: *Deleted

## 2013-10-22 VITALS — BP 122/78 | HR 98

## 2013-10-22 DIAGNOSIS — Z136 Encounter for screening for cardiovascular disorders: Secondary | ICD-10-CM

## 2013-10-22 DIAGNOSIS — Z0130 Encounter for examination of blood pressure without abnormal findings: Secondary | ICD-10-CM

## 2013-10-22 NOTE — Progress Notes (Signed)
   Pt in nurse clinic today for blood pressure check.  Blood pressure 122/78 manually, heart rate 98.  Pt denies any other symptoms today.  Clovis PuMartin, Euel Castile L, RN

## 2013-10-23 ENCOUNTER — Encounter: Payer: Self-pay | Admitting: Vascular Surgery

## 2013-10-23 ENCOUNTER — Other Ambulatory Visit: Payer: Self-pay

## 2013-10-23 DIAGNOSIS — I83893 Varicose veins of bilateral lower extremities with other complications: Secondary | ICD-10-CM

## 2013-11-02 ENCOUNTER — Ambulatory Visit: Payer: BC Managed Care – PPO | Admitting: Family Medicine

## 2013-11-06 ENCOUNTER — Ambulatory Visit: Payer: BC Managed Care – PPO | Admitting: Family Medicine

## 2013-11-16 ENCOUNTER — Ambulatory Visit (INDEPENDENT_AMBULATORY_CARE_PROVIDER_SITE_OTHER): Payer: BC Managed Care – PPO | Admitting: Family Medicine

## 2013-11-16 ENCOUNTER — Encounter: Payer: Self-pay | Admitting: Family Medicine

## 2013-11-16 ENCOUNTER — Encounter: Payer: Self-pay | Admitting: Vascular Surgery

## 2013-11-16 VITALS — BP 140/92 | HR 103 | Resp 16 | Ht 66.0 in | Wt 360.0 lb

## 2013-11-16 DIAGNOSIS — Z01419 Encounter for gynecological examination (general) (routine) without abnormal findings: Secondary | ICD-10-CM

## 2013-11-16 DIAGNOSIS — I10 Essential (primary) hypertension: Secondary | ICD-10-CM | POA: Insufficient documentation

## 2013-11-16 DIAGNOSIS — I83893 Varicose veins of bilateral lower extremities with other complications: Secondary | ICD-10-CM

## 2013-11-16 DIAGNOSIS — E669 Obesity, unspecified: Secondary | ICD-10-CM

## 2013-11-16 DIAGNOSIS — Z0000 Encounter for general adult medical examination without abnormal findings: Secondary | ICD-10-CM

## 2013-11-16 MED ORDER — HYDROCHLOROTHIAZIDE 12.5 MG PO TABS: 12.5000 mg | ORAL_TABLET | Freq: Every day | ORAL | Status: DC

## 2013-11-16 NOTE — Assessment & Plan Note (Signed)
Compression stockings ordered today. She to continue to followup with vascular Center tomorrow.

## 2013-11-16 NOTE — Progress Notes (Signed)
   Subjective:    Patient ID: [name], female    DOB: 12/21/1985, 28 y.o.   MRN: [id number]  HPI [name] is a 28 y.o. [facility].   Well woman exam: Patient is on her menstrual cycle today and has elected not to have her Pap completed today. Patient reports that her menses has been irregular since she was about [age] or 28 years old. They told her that time that she needed to lose weight. Patient was started on Depo-Provera to help control her periods and make them more regular. She currently is on no form of birth control. She states her period does come on approximately every 30 days, however sometimes a light and sometimes it's extremely heavy. She has never been pregnant.  Leg edema: Patient had a history of leg edema due to varicosities and she was 89 years old. Her mother in addition has varicosities with multiple surgeries. She states her right leg is normally her problem leg, but last week her left leg was also swollen. She does not complaining of pain. She does not currently own a pair of compression stockings that fit her. She has a appointment with vascular lab tomorrow. She is on her feet daily.  Elevated blood pressure: Review of Systems Per history of present illness    Objective:   Physical Exam BP 140/92  Pulse 103  Resp 16  Ht 5\' 6"  (1.676 m)  Wt 360 lb (163.295 kg)  BMI 58.13 kg/m2  LMP 11/09/2013 Gen: Morbidly obese, well-developed, well-nourished, pleasant African American female. HEENT: AT. [place].  Bilateral eyes without injections or icterus. MMM.  CV: RRR .  Ext: No erythema. Trace edema bilaterally. Multiple varicosities over feet and lower extremity. Pulses equal bilaterally    Assessment & Plan:

## 2013-11-16 NOTE — Patient Instructions (Signed)
Continue low-salt diet. Continue to monitor your blood pressure a few times in a month. I would like you to make a nurse's visit next week after starting medication, to have your blood pressure rechecked. Be certain to reschedule your Pap smear, if you can schedule that for the morning and we will perform your cholesterol lab at that time. I have placed a referral to nutrition, you will need to call her and schedule an appointment to be seen. Please attempt to get at least 150 minutes of exercise a week  Low-Sodium Eating Plan Sodium raises blood pressure and causes water to be held in the body. Getting less sodium from food will help lower your blood pressure, reduce any swelling, and protect your heart, liver, and kidneys. We get sodium by adding salt (sodium chloride) to food. Most of our sodium comes from canned, boxed, and frozen foods. Restaurant foods, fast foods, and pizza are also very high in sodium. Even if you take medicine to lower your blood pressure or to reduce fluid in your body, getting less sodium from your food is important. WHAT IS MY PLAN? Most people should limit their sodium intake to 2,300 mg a day. Your health care provider recommends that you limit your sodium intake to __________ a day.  WHAT DO I NEED TO KNOW ABOUT THIS EATING PLAN? For the low-sodium eating plan, you will follow these general guidelines:  Choose foods with a % Daily Value for sodium of less than 5% (as listed on the food label).   Use salt-free seasonings or herbs instead of table salt or sea salt.   Check with your health care provider or pharmacist before using salt substitutes.   Eat fresh foods.  Eat more vegetables and fruits.  Limit canned vegetables. If you do use them, rinse them well to decrease the sodium.   Limit cheese to 1 oz (28 g) per day.   Eat lower-sodium products, often labeled as "lower sodium" or "no salt added."  Avoid foods that contain monosodium glutamate  (MSG). MSG is sometimes added to [place] food and some canned foods.  Check food labels (Nutrition Facts labels) on foods to learn how much sodium is in one serving.  Eat more home-cooked food and less restaurant, buffet, and fast food.  When eating at a restaurant, ask that your food be prepared with less salt or none, if possible.  HOW DO I READ FOOD LABELS FOR SODIUM INFORMATION? The Nutrition Facts label lists the amount of sodium in one serving of the food. If you eat more than one serving, you must multiply the listed amount of sodium by the number of servings. Food labels may also identify foods as:  Sodium free--Less than 5 mg in a serving.  Very low sodium--35 mg or less in a serving.  Low sodium--140 mg or less in a serving.  Light in sodium--50% less sodium in a serving. For example, if a food that usually has 300 mg of sodium is changed to become light in sodium, it will have 150 mg of sodium.  Reduced sodium--25% less sodium in a serving. For example, if a food that usually has 400 mg of sodium is changed to reduced sodium, it will have 300 mg of sodium. WHAT FOODS CAN I EAT? Grains Low-sodium cereals, including oats, puffed wheat and rice, and shredded wheat cereals. Low-sodium crackers. Unsalted rice and pasta. Lower-sodium bread.  Vegetables Frozen or fresh vegetables. Low-sodium or reduced-sodium canned vegetables. Low-sodium or reduced-sodium tomato sauce and  paste. Low-sodium or reduced-sodium tomato and vegetable juices.  Fruits Fresh, frozen, and canned fruit. Fruit juice.  Meat and Other Protein Products Low-sodium canned tuna and salmon. Fresh or frozen meat, poultry, seafood, and fish. Lamb. Unsalted nuts. Dried beans, peas, and lentils without added salt. Unsalted canned beans. Homemade soups without salt. Eggs.  Dairy Milk. Soy milk. Ricotta cheese. Low-sodium or reduced-sodium cheeses. Yogurt.  Condiments Fresh and dried herbs and spices. Salt-free  seasonings. Onion and garlic powders. Low-sodium varieties of mustard and ketchup. Lemon juice.  Fats and Oils Reduced-sodium salad dressings. Unsalted butter.  Other Unsalted popcorn and pretzels.  The items listed above may not be a complete list of recommended foods or beverages. Contact your dietitian for more options. WHAT FOODS ARE NOT RECOMMENDED? Grains Instant hot cereals. Bread stuffing, pancake, and biscuit mixes. Croutons. Seasoned rice or pasta mixes. Noodle soup cups. Boxed or frozen macaroni and cheese. Self-rising flour. Regular salted crackers. Vegetables Regular canned vegetables. Regular canned tomato sauce and paste. Regular tomato and vegetable juices. Frozen vegetables in sauces. Salted french fries. Olives. Rosita FirePickles. Relishes. Sauerkraut. Salsa. Meat and Other Protein Products Salted, canned, smoked, spiced, or pickled meats, seafood, or fish. Bacon, ham, sausage, hot dogs, corned beef, chipped beef, and packaged luncheon meats. Salt pork. Jerky. Pickled herring. Anchovies, regular canned tuna, and sardines. Salted nuts. Dairy Processed cheese and cheese spreads. Cheese curds. Blue cheese and cottage cheese. Buttermilk.  Condiments Onion and garlic salt, seasoned salt, table salt, and sea salt. Canned and packaged gravies. Worcestershire sauce. Tartar sauce. Barbecue sauce. Teriyaki sauce. Soy sauce, including reduced sodium. Steak sauce. Fish sauce. [name] sauce. Cocktail sauce. Horseradish. Regular ketchup and mustard. Meat flavorings and tenderizers. Bouillon cubes. Hot sauce. Tabasco sauce. Marinades. Taco seasonings. Relishes. Fats and Oils Regular salad dressings. Salted butter. Margarine. Ghee. Bacon fat.  Other Potato and tortilla chips. Corn chips and puffs. Salted popcorn and pretzels. Canned or dried soups. Pizza. Frozen entrees and pot pies.  The items listed above may not be a complete list of foods and beverages to avoid. Contact your dietitian  for more information. Document Released: 09/22/2001 Document Revised: 04/07/2013 Document Reviewed: 02/04/2013 [facility] Patient Information 2015 [place], [place]. This information is not intended to replace advice given to you by your health care provider. Make sure you discuss any questions you have with your health care provider.

## 2013-11-16 NOTE — Assessment & Plan Note (Signed)
Patient now meets criteria for essential hypertension. She has attempted a low-salt diet without results of lowering blood pressure We'll start 12.5 HCTZ daily. Patient to monitor blood pressure at home. Patient to come in for nurse visit in one week after starting medications to recheck blood pressure.

## 2013-11-16 NOTE — Assessment & Plan Note (Signed)
Counseled today on weight loss. Low-salt diet recommended. 150 minutes of exercise weekly recommended. Nutrition consult referred today.

## 2013-11-16 NOTE — Assessment & Plan Note (Signed)
Patient to reschedule for Pap smear. Will perform cholesterol/lipids at that time as well.

## 2013-11-17 ENCOUNTER — Ambulatory Visit: Payer: BC Managed Care – PPO | Admitting: Vascular Surgery

## 2013-11-17 ENCOUNTER — Encounter (HOSPITAL_COMMUNITY): Payer: BC Managed Care – PPO

## 2013-11-23 ENCOUNTER — Telehealth: Payer: Self-pay | Admitting: Family Medicine

## 2013-11-23 ENCOUNTER — Ambulatory Visit (INDEPENDENT_AMBULATORY_CARE_PROVIDER_SITE_OTHER): Payer: BC Managed Care – PPO | Admitting: *Deleted

## 2013-11-23 VITALS — BP 132/70

## 2013-11-23 DIAGNOSIS — Z136 Encounter for screening for cardiovascular disorders: Secondary | ICD-10-CM

## 2013-11-23 DIAGNOSIS — Z0130 Encounter for examination of blood pressure without abnormal findings: Secondary | ICD-10-CM

## 2013-11-23 NOTE — Telephone Encounter (Signed)
LVM for patient to call back. ?

## 2013-11-23 NOTE — Progress Notes (Signed)
   Pt in nurse clinic for blood pressure check.  BP 132/70 manually.  Pt denies any symptoms today.  Clovis PuMartin, Laythan Hayter L, RN

## 2013-11-23 NOTE — Telephone Encounter (Signed)
Please call Pt and tell her BP is improved to normal by her Nurse visit today. She is to continue following a low salt diet, take medication and exercise. We will disciss in more detail during her upcoming appointment. Thanks.

## 2013-11-24 NOTE — Telephone Encounter (Signed)
LVM for patient to call back. ?

## 2013-11-24 NOTE — Telephone Encounter (Signed)
Pt informed

## 2013-11-30 ENCOUNTER — Encounter: Payer: Self-pay | Admitting: Vascular Surgery

## 2013-12-01 ENCOUNTER — Ambulatory Visit: Payer: BC Managed Care – PPO | Admitting: Vascular Surgery

## 2013-12-01 ENCOUNTER — Encounter (HOSPITAL_COMMUNITY): Payer: BC Managed Care – PPO

## 2013-12-03 ENCOUNTER — Ambulatory Visit: Payer: BC Managed Care – PPO | Admitting: Family Medicine

## 2014-01-04 ENCOUNTER — Other Ambulatory Visit: Payer: Self-pay | Admitting: *Deleted

## 2014-01-04 DIAGNOSIS — I10 Essential (primary) hypertension: Secondary | ICD-10-CM

## 2014-01-04 MED ORDER — HYDROCHLOROTHIAZIDE 12.5 MG PO TABS: 12.5000 mg | ORAL_TABLET | Freq: Every day | ORAL | Status: DC

## 2014-01-04 NOTE — Telephone Encounter (Signed)
I have refilled HCTZ. Thanks.

## 2014-01-05 NOTE — Telephone Encounter (Signed)
LVM for patient to call back to inform her of below 

## 2014-01-07 ENCOUNTER — Ambulatory Visit: Payer: BC Managed Care – PPO | Admitting: Family Medicine

## 2014-01-25 ENCOUNTER — Telehealth: Payer: Self-pay | Admitting: Family Medicine

## 2014-01-25 ENCOUNTER — Ambulatory Visit (INDEPENDENT_AMBULATORY_CARE_PROVIDER_SITE_OTHER): Payer: BC Managed Care – PPO | Admitting: Family Medicine

## 2014-01-25 ENCOUNTER — Encounter: Payer: Self-pay | Admitting: Family Medicine

## 2014-01-25 ENCOUNTER — Other Ambulatory Visit (HOSPITAL_COMMUNITY)
Admission: RE | Admit: 2014-01-25 | Discharge: 2014-01-25 | Disposition: A | Discharge status: Home / Self Care | Payer: BC Managed Care – PPO | Source: Ambulatory Visit | Attending: Family Medicine | Admitting: Family Medicine

## 2014-01-25 ENCOUNTER — Encounter: Payer: Self-pay | Admitting: Vascular Surgery

## 2014-01-25 VITALS — BP 116/83 | HR 88 | Temp 97.8°F | Ht 66.0 in | Wt 372.7 lb

## 2014-01-25 DIAGNOSIS — Z3009 Encounter for other general counseling and advice on contraception: Secondary | ICD-10-CM | POA: Insufficient documentation

## 2014-01-25 DIAGNOSIS — Z309 Encounter for contraceptive management, unspecified: Secondary | ICD-10-CM

## 2014-01-25 DIAGNOSIS — Z124 Encounter for screening for malignant neoplasm of cervix: Secondary | ICD-10-CM | POA: Diagnosis not present

## 2014-01-25 DIAGNOSIS — Z01419 Encounter for gynecological examination (general) (routine) without abnormal findings: Secondary | ICD-10-CM | POA: Diagnosis present

## 2014-01-25 DIAGNOSIS — Z113 Encounter for screening for infections with a predominantly sexual mode of transmission: Secondary | ICD-10-CM | POA: Insufficient documentation

## 2014-01-25 DIAGNOSIS — Z202 Contact with and (suspected) exposure to infections with a predominantly sexual mode of transmission: Secondary | ICD-10-CM

## 2014-01-25 DIAGNOSIS — E669 Obesity, unspecified: Secondary | ICD-10-CM

## 2014-01-25 DIAGNOSIS — Z0000 Encounter for general adult medical examination without abnormal findings: Secondary | ICD-10-CM

## 2014-01-25 HISTORY — DX: Encounter for other general counseling and advice on contraception: Z30.09

## 2014-01-25 LAB — POCT WET PREP (WET MOUNT): CLUE CELLS WET PREP WHIFF POC: POSITIVE

## 2014-01-25 LAB — RPR

## 2014-01-25 LAB — LIPID PANEL
Cholesterol: 115 mg/dL (ref 0–200)
HDL: 51 mg/dL (ref >39)
LDL CALC: 50 mg/dL (ref 0–99)
TRIGLYCERIDES: 71 mg/dL (ref <150)
Total CHOL/HDL Ratio: 2.3 Ratio
VLDL: 14 mg/dL (ref 0–40)

## 2014-01-25 LAB — POCT URINE PREGNANCY: Preg Test, Ur: NEGATIVE

## 2014-01-25 MED ORDER — NORGESTIMATE-ETH ESTRADIOL 0.25-35 MG-MCG PO TABS: 1.0000 | ORAL_TABLET | Freq: Every day | ORAL | Status: DC

## 2014-01-25 MED ORDER — METRONIDAZOLE 500 MG PO TABS: 500.0000 mg | ORAL_TABLET | Freq: Two times a day (BID) | ORAL | Status: DC

## 2014-01-25 MED ORDER — FLUCONAZOLE 150 MG PO TABS: 150.0000 mg | ORAL_TABLET | Freq: Once | ORAL | Status: DC

## 2014-01-25 NOTE — Assessment & Plan Note (Signed)
Patient desires STD panel today. HIV/RPR/hepatitis, wet prep and GC collected

## 2014-01-25 NOTE — Addendum Note (Signed)
Addended by: [place]ORDAN, Rydan Gulyas on: 01/25/2014 12:30 PM   Modules accepted: Orders

## 2014-01-25 NOTE — Telephone Encounter (Signed)
Received phone call that patient would like to stay with the Depo-Provera shot, as opposed to the birth control pills we discussed today. She had a negative pregnancy test today in the clinic. If she would like she can return to clinic this afternoon and have Depo-Provera shot if nursing/staff has time to work her in. Otherwise she will have to return to clinic and have another pregnancy test done prior to having the shot completed. Thank you

## 2014-01-25 NOTE — Patient Instructions (Signed)
Oral Contraception Information Oral contraceptive pills (OCPs) are medicines taken to prevent pregnancy. OCPs work by preventing the ovaries from releasing eggs. The hormones in OCPs also cause the cervical mucus to thicken, preventing the sperm from entering the uterus. The hormones also cause the uterine lining to become thin, not allowing a fertilized egg to attach to the inside of the uterus. OCPs are highly effective when taken exactly as prescribed. However, OCPs do not prevent sexually transmitted diseases (STDs). Safe sex practices, such as using condoms along with the pill, can help prevent STDs.  Before taking the pill, you may have a physical exam and Pap test. Your health care provider may order blood tests. The health care provider will make sure you are a good candidate for oral contraception. Discuss with your health care provider the possible side effects of the OCP you may be prescribed. When starting an OCP, it can take 2 to 3 months for the body to adjust to the changes in hormone levels in your body.  TYPES OF ORAL CONTRACEPTION  The combination pill--This pill contains estrogen and progestin (synthetic progesterone) hormones. The combination pill comes in 21-day, 28-day, or 91-day packs. Some types of combination pills are meant to be taken continuously (365-day pills). With 21-day packs, you do not take pills for 7 days after the last pill. With 28-day packs, the pill is taken every day. The last 7 pills are without hormones. Certain types of pills have more than 21 hormone-containing pills. With 91-day packs, the first 84 pills contain both hormones, and the last 7 pills contain no hormones or contain estrogen only.  The minipill--This pill contains the progesterone hormone only. The pill is taken every day continuously. It is very important to take the pill at the same time each day. The minipill comes in packs of 28 pills. All 28 pills contain the hormone.  ADVANTAGES OF ORAL  CONTRACEPTIVE PILLS  Decreases premenstrual symptoms.   Treats menstrual period cramps.   Regulates the menstrual cycle.   Decreases a heavy menstrual flow.   May treatacne, depending on the type of pill.   Treats abnormal uterine bleeding.   Treats polycystic ovarian syndrome.   Treats endometriosis.   Can be used as emergency contraception.  THINGS THAT CAN MAKE ORAL CONTRACEPTIVE PILLS LESS EFFECTIVE OCPs can be less effective if:   You forget to take the pill at the same time every day.   You have a stomach or intestinal disease that lessens the absorption of the pill.   You take OCPs with other medicines that make OCPs less effective, such as antibiotics, certain HIV medicines, and some seizure medicines.   You take expired OCPs.   You forget to restart the pill on day 7, when using the packs of 21 pills.  RISKS ASSOCIATED WITH ORAL CONTRACEPTIVE PILLS  Oral contraceptive pills can sometimes cause side effects, such as:  Headache.  Nausea.  Breast tenderness.  Irregular bleeding or spotting. Combination pills are also associated with a small increased risk of:  Blood clots.  Heart attack.  Stroke. Document Released: 06/23/2002 Document Revised: 01/21/2013 Document Reviewed: 09/21/2012 [facility] Patient Information 2015 [place], [place]. This information is not intended to replace advice given to you by your health care provider. Make sure you discuss any questions you have with your health care provider.  I will call you with your lab results.  It was a pleasure to see you again. I have called in your birth control pills.

## 2014-01-25 NOTE — Assessment & Plan Note (Signed)
Patient amendable to starting birth control pills today after discussing options. She desires reversible birth control method, she does not desire children within the next few years. She wants to be on birth control pills to regulate out her menses. Followup 1 year.

## 2014-01-25 NOTE — Telephone Encounter (Signed)
Calling to let MD know she would like to stick with getting the birth control shot

## 2014-01-25 NOTE — Progress Notes (Signed)
   Subjective:    Patient ID: Conley SimmondsShama J Gambill, female    DOB: 09/30/1985, 28 y.o.   MRN: [id number]  HPI Conley SimmondsShama J Theroux is a 28 y.o. female present for well women exam  Well women:  G0P0. Patient reports that her menses has been irregular since she was about [age] or 28 years old. They told her that time that she needed to lose weight. Patient was started on Depo-Provera to help control her periods and make them more regular, she has been off depo for years. She currently is on no form of birth control. She states her period does come on approximately every 30 days, however sometimes a light and sometimes it's extremely heavy. Her last menses came in 2 weeks apart from each other, this is abnormal for her. She has never been pregnant. She is in a monogamous relationship with a female partner. There is no family history of breast cancer. She has not had her regular Paps in the past.  Health maintenance: Patient declines flu shot today.   Review of Systems Per history of present illness    Objective:   Physical Exam BP 116/83  Pulse 88  Temp(Src) 97.8 F (36.6 C) (Oral)  Ht 5\' 6"  (1.676 m)  Wt 372 lb 11.2 oz (169.056 kg)  BMI 60.18 kg/m2  LMP 12/29/2013 Gen: Very pleasant, morbidly obese African American female. Well-developed, well-nourished, no acute distress nontoxic in appearance. HEENT: AT. [place]. Bilateral eyes without injections or icterus. MMM. Abd: Soft. Morbidly obese. NTND. BS present. No Masses palpated.   GYN:  External genitalia within normal limits.  Vaginal mucosa pink, moist, normal rugae.  Nonfriable cervix without lesions, no discharge or bleeding noted on speculum exam.  Bimanual exam revealed normal, nongravid uterus.  No cervical motion tenderness. No adnexal masses bilaterally.       Assessment & Plan:

## 2014-01-25 NOTE — Telephone Encounter (Signed)
Please call [name], her wet prep showed a mild yeast and bacteria infection. I have called her in 2 medications for her to take. These are not considered a STD. Thanks.

## 2014-01-25 NOTE — Assessment & Plan Note (Signed)
Pap collected today. Along with wet prep and STD panel. We will call patient with results.

## 2014-01-25 NOTE — Addendum Note (Signed)
Addended by: SwazilandJORDAN, Edwinna Rochette on: 01/25/2014 10:47 AM   Modules accepted: Orders

## 2014-01-25 NOTE — Telephone Encounter (Signed)
Spoke with patient and informed her of below 

## 2014-01-26 ENCOUNTER — Encounter (HOSPITAL_COMMUNITY): Payer: BC Managed Care – PPO

## 2014-01-26 ENCOUNTER — Ambulatory Visit: Payer: BC Managed Care – PPO | Admitting: Vascular Surgery

## 2014-01-26 LAB — HEPATITIS PANEL, ACUTE
HCV AB: NEGATIVE
HEP B S AG: NEGATIVE
Hep A IgM: NONREACTIVE
Hep B C IgM: NONREACTIVE

## 2014-01-26 LAB — CERVICOVAGINAL ANCILLARY ONLY
CHLAMYDIA, DNA PROBE: NEGATIVE
NEISSERIA GONORRHEA: NEGATIVE

## 2014-01-26 LAB — HIV ANTIBODY (ROUTINE TESTING W REFLEX): HIV 1&2 Ab, 4th Generation: NONREACTIVE

## 2014-01-26 LAB — CYTOLOGY - PAP

## 2014-01-27 ENCOUNTER — Encounter: Payer: Self-pay | Admitting: Family Medicine

## 2014-01-27 ENCOUNTER — Telehealth: Payer: Self-pay | Admitting: Family Medicine

## 2014-01-27 NOTE — Telephone Encounter (Signed)
Please call [name], her results are all good. PAP was normal. STD panel all normal (HIV, RPR, Hepatitis, gonorrhea and chlamydia). In addition her cholesterol panel looked good. Thanks. I will send her copies via mail as well.

## 2014-01-27 NOTE — Telephone Encounter (Signed)
LVM for patient to call back. ?

## 2014-01-27 NOTE — Telephone Encounter (Signed)
LVM for patient tot call back to give below results

## 2014-01-28 NOTE — Telephone Encounter (Signed)
Pt informed

## 2014-02-04 ENCOUNTER — Other Ambulatory Visit: Payer: Self-pay | Admitting: Family Medicine

## 2014-02-04 ENCOUNTER — Ambulatory Visit: Payer: BC Managed Care – PPO

## 2014-02-04 DIAGNOSIS — I10 Essential (primary) hypertension: Secondary | ICD-10-CM

## 2014-02-04 NOTE — Telephone Encounter (Signed)
Pt called and needs a refill on her Hydrochlorothiazide called in at 90 day qty for insurance purposes. She will be out tomorrow. jw

## 2014-02-05 MED ORDER — HYDROCHLOROTHIAZIDE 12.5 MG PO TABS: 12.5000 mg | ORAL_TABLET | Freq: Every day | ORAL | Status: DC

## 2014-02-05 NOTE — Telephone Encounter (Signed)
LVM  For patient to call back to inform her that Hydrochlorothiazide called in at 90 day qty

## 2014-04-22 ENCOUNTER — Encounter: Payer: Self-pay | Admitting: Family Medicine

## 2014-04-22 ENCOUNTER — Ambulatory Visit (INDEPENDENT_AMBULATORY_CARE_PROVIDER_SITE_OTHER): Payer: Self-pay | Admitting: Family Medicine

## 2014-04-22 VITALS — BP 122/64 | HR 95 | Temp 98.3°F | Ht 66.0 in | Wt 365.2 lb

## 2014-04-22 DIAGNOSIS — M545 Low back pain, unspecified: Secondary | ICD-10-CM

## 2014-04-22 MED ORDER — CYCLOBENZAPRINE HCL 5 MG PO TABS: 5.0000 mg | ORAL_TABLET | Freq: Three times a day (TID) | ORAL | Status: DC | PRN

## 2014-04-22 MED ORDER — NAPROXEN 500 MG PO TABS: 500.0000 mg | ORAL_TABLET | Freq: Two times a day (BID) | ORAL | Status: DC

## 2014-04-22 NOTE — Progress Notes (Signed)
    Subjective   [name] Danser is a 29 y.o. female that presents for a same day visit  Back Pain: Location: right lower back just above iliiac crest  Duration: 2 days but reports that she has had pain like this before.  Quality: 10/10 Current Functional Status:  Was not able to work last night due to pain.   Preceding Events: none falls. none trauma Alleviating Factors: ibuprofen helped  Exacerbating Factors: moving  Hx of intervention: none surgery, none PT Hx of imaging: none Red Flags: no weakness, no numbness and tingling, no impaired bowel or bladder function   History  Substance Use Topics  . Smoking status: Never Smoker   . Smokeless tobacco: Never Used  . Alcohol Use: 0.6 oz/week    1 Glasses of wine per week     Comment: occasional    ROS Per HPI  Objective   BP 122/64 mmHg  Pulse 95  Temp(Src) 98.3 F (36.8 C) (Oral)  Ht 5\' 6"  (1.676 m)  Wt 365 lb 3.2 oz (165.654 kg)  BMI 58.97 kg/m2  LMP 04/05/2014  General: NAD, alert, cooperative with exam, well-appearing, morbidly obese  Back:  Appearance: sciolosis no Palpation: tenderness of paraspinal muscles yes on the right lumbar region proximal to iliac crest, spinous process no; pelvis no  Hip:  Appearance: no erythema or ecchymosis  Palpation: tenderness of greater trochanter no Rotation Reduced: internal no, external no FADIR: neg b/l but felt the stretching on the left helped the pain FABER: neg b/l   Neuro: Strength hip flexion 5/5, hip abduction 5/5, hip adduction 5/5,  knee extension 5/5, knee flexion 5/5, dorsiflexion 5/5, plantar flexion 5/5 Reflexes: patella 2/2 Bilateral  Achilles 2/2 Bilateral Straight Leg Raise: negative Sensation to light touch intact: yes Neurovascularly intact    Assessment and Plan   Please refer to problem based charting of assessment and plan

## 2014-04-22 NOTE — Patient Instructions (Addendum)
Thank you for coming in,   Most likely you have a muscle spasm. We need to re-train your muscles in your back. The stretching will help that.   Treatment - you should: take the naproxen and flexeril   Please follow up me or Dr. Claiborne BillingsKuneff in 3-4 weeks if you have no improvement.    Please feel free to call with any questions or concerns at any time, at [phone]. --Dr. Jordan LikesSchmitz

## 2014-04-23 DIAGNOSIS — M545 Low back pain, unspecified: Secondary | ICD-10-CM | POA: Insufficient documentation

## 2014-04-23 NOTE — Assessment & Plan Note (Addendum)
Most likely a muscle spasm (quadratus lumborum). No radiculopathy. No trauma or injury. This pain has occurred before and is most likely related to her morbid obesity.  - Naproxen for 14 days.  - Flexeril  - RICE  - Given home modalities  - If no improvement in 3-4 weeks consider PT referral.

## 2014-09-14 ENCOUNTER — Encounter (HOSPITAL_COMMUNITY): Payer: Self-pay | Admitting: Emergency Medicine

## 2014-09-14 ENCOUNTER — Emergency Department (HOSPITAL_COMMUNITY)
Admission: EM | Admit: 2014-09-14 | Discharge: 2014-09-14 | Disposition: A | Discharge status: Home / Self Care | Payer: BLUE CROSS/BLUE SHIELD | Attending: Emergency Medicine | Admitting: Emergency Medicine

## 2014-09-14 DIAGNOSIS — Z7952 Long term (current) use of systemic steroids: Secondary | ICD-10-CM | POA: Insufficient documentation

## 2014-09-14 DIAGNOSIS — Z791 Long term (current) use of non-steroidal anti-inflammatories (NSAID): Secondary | ICD-10-CM | POA: Insufficient documentation

## 2014-09-14 DIAGNOSIS — Y9389 Activity, other specified: Secondary | ICD-10-CM | POA: Diagnosis not present

## 2014-09-14 DIAGNOSIS — Y9289 Other specified places as the place of occurrence of the external cause: Secondary | ICD-10-CM | POA: Diagnosis not present

## 2014-09-14 DIAGNOSIS — E669 Obesity, unspecified: Secondary | ICD-10-CM | POA: Insufficient documentation

## 2014-09-14 DIAGNOSIS — Y998 Other external cause status: Secondary | ICD-10-CM | POA: Diagnosis not present

## 2014-09-14 DIAGNOSIS — Z79899 Other long term (current) drug therapy: Secondary | ICD-10-CM | POA: Diagnosis not present

## 2014-09-14 DIAGNOSIS — X58XXXA Exposure to other specified factors, initial encounter: Secondary | ICD-10-CM | POA: Diagnosis not present

## 2014-09-14 DIAGNOSIS — Z8679 Personal history of other diseases of the circulatory system: Secondary | ICD-10-CM | POA: Diagnosis not present

## 2014-09-14 DIAGNOSIS — S91001A Unspecified open wound, right ankle, initial encounter: Secondary | ICD-10-CM | POA: Diagnosis not present

## 2014-09-14 DIAGNOSIS — S99911A Unspecified injury of right ankle, initial encounter: Secondary | ICD-10-CM | POA: Diagnosis present

## 2014-09-14 LAB — CBG MONITORING, ED: Glucose-Capillary: 103 mg/dL — ABNORMAL HIGH (ref 65–99)

## 2014-09-14 MED ORDER — IBUPROFEN 600 MG PO TABS: 600.0000 mg | ORAL_TABLET | Freq: Four times a day (QID) | ORAL | Status: DC | PRN

## 2014-09-14 NOTE — ED Notes (Signed)
Per patient, states sore on ankle for about 2 weeks-not getting any better

## 2014-09-14 NOTE — ED Notes (Signed)
Case Manager in to see pt-follow up wound care [place]

## 2014-09-14 NOTE — Discharge Instructions (Signed)
Return to the emergency room with worsening of symptoms, new symptoms or with symptoms that are concerning, especially fevers, redness, swelling, red streaks, pus, general ill feeling. Follow up with found care. Dressing Change A dressing is a material placed over wounds. It keeps the wound clean, dry, and protected from further injury. This provides an environment that favors wound healing.  BEFORE YOU BEGIN  Get your supplies together. Things you may need include:  Saline solution.  Flexible gauze dressing.  Medicated cream.  Tape.  Gloves.  Abdominal dressing pads.  Gauze squares.  Plastic bags.  Take pain medicine 30 minutes before the dressing change if you need it.  Take a shower before you do the first dressing change of the day. Use plastic wrap or a plastic bag to prevent the dressing from getting wet. REMOVING YOUR OLD DRESSING   Wash your hands with soap and water. Dry your hands with a clean towel.  Put on your gloves.  Remove any tape.  Carefully remove the old dressing. If the dressing sticks, you may dampen it with warm water to loosen it, or follow your caregiver's specific directions.  Remove any gauze or packing tape that is in your wound.  Take off your gloves.  Put the gloves, tape, gauze, or any packing tape into a plastic bag. CHANGING YOUR DRESSING  Open the supplies.  Take the cap off the saline solution.  Open the gauze package so that the gauze remains on the inside of the package.  Put on your gloves.  Clean your wound as told by your caregiver.  If you have been told to keep your wound dry, follow those instructions.  Your caregiver may tell you to do one or more of the following:  Pick up the gauze. Pour the saline solution over the gauze. Squeeze out the extra saline solution.  Put medicated cream or other medicine on your wound if you have been told to do so.  Put the solution soaked gauze only in your wound, not on the skin  around it.  Pack your wound loosely or as told by your caregiver.  Put dry gauze on your wound.  Put abdominal dressing pads over the dry gauze if your wet gauze soaks through.  Tape the abdominal dressing pads in place so they will not fall off. Do not wrap the tape completely around the affected part (arm, leg, abdomen).  Wrap the dressing pads with a flexible gauze dressing to secure it in place.  Take off your gloves. Put them in the plastic bag with the old dressing. Tie the bag shut and throw it away.  Keep the dressing clean and dry until your next dressing change.  Wash your hands. SEEK MEDICAL CARE IF:  Your skin around the wound looks red.  Your wound feels more tender or sore.  You see pus in the wound.  Your wound smells bad.  You have a fever.  Your skin around the wound has a rash that itches and burns.  You see black or yellow skin in your wound that was not there before.  You feel nauseous, throw up, and feel very tired. Document Released: 05/10/2004 Document Revised: 06/25/2011 Document Reviewed: 02/12/2011 [facility] Patient Information 2015 [place], [place]. This information is not intended to replace advice given to you by your health care provider. Make sure you discuss any questions you have with your health care provider.

## 2014-09-14 NOTE — Progress Notes (Addendum)
CM consulted by ED NP/PA to inquire about wound care clinic appt for pt Cm called [phone] and [facility] with [facility] [address], [place] [address] Pt given first available appt for October 13 2014 at 1415  EDP PA/NP updated Information placed in EPIC f/u section along with [facility] [place] sister office if pt needs earlier appt [phone] Spoke with Shelia at this office to schedule a September 20 2014 0845 appt office [address]   1535 Cm reviewed appts with pt who [facility] but [facility] [place] Pt will call to see if she can be [facility] on [facility]  1538 Cm spoke with [name] [facility]id at [facility] [address], [place], [place] [address] ext [phone] to confirm pt can keep 10/13/14 appt and transfer back from the [place] office to the [place] office on 10/13/14 Requested pt be placed on transfer list  Pt confirmed pcp as KUNEFF, RENEE A

## 2014-09-14 NOTE — ED Provider Notes (Signed)
CSN: [id number]     Arrival date & time 09/14/14  1234 History  This chart was scribed for a non-physician practitioner, Oswaldo ConroyVictoria Telesforo Brosnahan, PA-C working with Blane OharaJoshua Zavitz, MD by [place] Peace, ED Scribe. The patient was seen in WTR5/WTR5. The patient's care was started at 2:55 PM.    Chief Complaint  Patient presents with  . sore on ankle       The history is provided by the patient. No language interpreter was used.    HPI Comments: [name] is a 29 y.o. female who presents to the Emergency Department complaining of sore to lateral aspect of right ankle onset 1.5 weeks ago that has continued to get progressively worse. She now complains of pain to affected area. Pain exacerbated with ambulation, movement, and putting on shoes. She states when she stands up, "it feels as if her ankle is falling apart". She denies any recent traumas or mechanisms of injury to affected ankle. She further denies any numbness, tingling, or red streaking from affected area. Pt had blood sugar levels of 103 today. Pt reports her PCP has put her on HTZ for her leg swelling.    Past Medical History  Diagnosis Date  . Obesity   . Varicose veins    Past Surgical History  Procedure Laterality Date  . Tonsillectomy     Family History  Problem Relation Age of Onset  . Diabetes Mother    History  Substance Use Topics  . Smoking status: Never Smoker   . Smokeless tobacco: Never Used  . Alcohol Use: 0.6 oz/week    1 Glasses of wine per week     Comment: occasional   OB History    No data available     Review of Systems  Constitutional: Negative for fever and chills.  Cardiovascular: Positive for leg swelling.  Skin: Positive for color change and wound.       Sore to right ankle.   Neurological: Negative for weakness and numbness.      Allergies  Review of patient's allergies indicates no known allergies.  Home Medications   Prior to Admission medications   Medication Sig Start Date End  Date Taking? Authorizing Provider  acetaminophen (TYLENOL) 500 MG tablet Take 500 mg by mouth every 6 (six) hours as needed (for pain.).    Historical Provider, MD  cyclobenzaprine (FLEXERIL) 5 MG tablet Take 1 tablet (5 mg total) by mouth 3 (three) times daily as needed for muscle spasms. 04/22/14   Myra RudeJeremy E Schmitz, MD  cyclopentolate (CYCLOGYL) 1 % ophthalmic solution Place 1 drop into the right eye 2 (two) times daily. 08/04/13   Marissa Sciacca, PA-C  fluconazole (DIFLUCAN) 150 MG tablet Take 1 tablet (150 mg total) by mouth once. 01/25/14   [name], DO  hydrochlorothiazide (HYDRODIURIL) 12.5 MG tablet Take 1 tablet (12.5 mg total) by mouth daily. 02/05/14   [name], DO  ibuprofen (ADVIL,MOTRIN) 600 MG tablet Take 1 tablet (600 mg total) by mouth every 6 (six) hours as needed. 09/14/14   Oswaldo ConroyVictoria Tailor Westfall, PA-C  metroNIDAZOLE (FLAGYL) 500 MG tablet Take 1 tablet (500 mg total) by mouth 2 (two) times daily. 01/25/14   [name], DO  naproxen (NAPROSYN) 500 MG tablet Take 1 tablet (500 mg total) by mouth 2 (two) times daily with a meal. 04/22/14   Myra RudeJeremy E Schmitz, MD  norgestimate-ethinyl estradiol (SPRINTEC 28) 0.25-35 MG-MCG tablet Take 1 tablet by mouth daily. 01/25/14   Renee A Claiborne BillingsKuneff,  DO  prednisoLONE acetate (PRED FORTE) 1 % ophthalmic suspension Place 1 drop into the right eye 2 (two) times daily.    Historical Provider, MD  trimethoprim-polymyxin b (POLYTRIM) ophthalmic solution Place 1 drop into the right eye every 6 (six) hours.    Historical Provider, MD   BP 115/65 mmHg  Pulse 86  Temp(Src) 98.2 F (36.8 C) (Oral)  SpO2 100% Physical Exam  Constitutional: She appears well-developed and well-nourished. No distress.  HENT:  Head: Normocephalic and atraumatic.  Eyes: Conjunctivae are normal. Right eye exhibits no discharge. Left eye exhibits no discharge.  Cardiovascular:  Pulses:      Dorsalis pedis pulses are 2+ on the right side, and 2+ on the left side.        Posterior tibial pulses are 2+ on the right side, and 2+ on the left side.  Pulmonary/Chest: Effort normal. No respiratory distress.  Musculoskeletal:  Right ankle- Good ROM. 5/5 strength in bilateral LE's. Intact sensation.   Neurological: She is alert. Coordination normal.  Skin: Skin is dry. Rash noted. She is not diaphoretic.  2 cm nonhealing wound to right lateral malleolus. No bone palpated. No signs of surrounding erythema or swelling. No pus. No red streaks.  Psychiatric: She has a normal mood and affect. Her behavior is normal.  Nursing note and vitals reviewed.   ED Course  Procedures (including critical care time) Labs Review Labs Reviewed  CBG MONITORING, ED - Abnormal; Notable for the following:    Glucose-Capillary 103 (*)    All other components within normal limits    Imaging Review No results found.   EKG Interpretation None     Medications - No data to display  3:01 PM- Treatment plan was discussed with patient who verbalizes understanding and agrees.   MDM   Final diagnoses:  Ankle wound, right, initial encounter   Patient with nonhealing ankle wound with no history of diabetes. Patient morbidly obese with peripheral edema. VSS. Neurovascularly intact. No bone palpated. I doubt osteomyelitis. Patient has been given a referral to the wound center with an appointment on Monday in follow-up at the wound Center severe on the 29th of this month. No antibiotics indicated at this time. Discussed wound care as well as weight loss class follow-up.  Discussed return precautions with patient. Discussed all results and patient verbalizes understanding and agrees with plan.  This is a shared patient. This patient was discussed with the physician who saw and evaluated the patient and agrees with the plan.   Oswaldo Conroy, PA-C 09/14/14 1743  Blane Ohara, MD 09/18/14 [phone]

## 2014-09-14 NOTE — ED Notes (Signed)
EDP in with pt to evaluate wound

## 2014-09-15 NOTE — Progress Notes (Signed)
Fax confirmation received at 1215 09/15/14 for clinicals faxed to [phone] [facility]

## 2014-09-20 ENCOUNTER — Encounter: Payer: BLUE CROSS/BLUE SHIELD | Attending: Surgery | Admitting: Surgery

## 2014-09-20 DIAGNOSIS — L97312 Non-pressure chronic ulcer of right ankle with fat layer exposed: Secondary | ICD-10-CM | POA: Diagnosis present

## 2014-09-20 DIAGNOSIS — R600 Localized edema: Secondary | ICD-10-CM | POA: Insufficient documentation

## 2014-09-20 DIAGNOSIS — I83013 Varicose veins of right lower extremity with ulcer of ankle: Secondary | ICD-10-CM | POA: Insufficient documentation

## 2014-09-20 NOTE — Progress Notes (Signed)
[name], [name] ([id number]) Visit Report for 09/20/2014 Abuse/Suicide Risk Screen Details Patient Name: [name], [name]. Date of Service: 09/20/2014 9:00 AM Medical Record Number: [id number] Patient Account Number: [id number] Date of Birth/Sex: 1985/07/12 (28 y.o. Female) Treating RN: Renee Harder Primary Care Physician: Felix Pacini Other Clinician: Referring Physician: Felix Pacini Treating Physician/Extender: Rudene Re in Treatment: 0 Abuse/Suicide Risk Screen Items Answer ABUSE/SUICIDE RISK SCREEN: Has anyone close to you tried to hurt or harm you recentlyo No Do you feel uncomfortable with anyone in your familyo No Has anyone forced you do things that you didnot want to doo No Do you have any thoughts of harming yourselfo No Patient displays signs or symptoms of abuse and/or neglect. No Electronic Signature(s) Signed: 09/20/2014 4:49:26 PM By: Renee Harder RN Entered By: Renee Harder on 09/20/2014 10:01:37 Kasperski, [name] -------------------------------------------------------------------------------- Activities of Daily Living Details Patient Name: [name], [name]. Date of Service: 09/20/2014 9:00 AM Medical Record Number: [id number] Patient Account Number: [id number] Date of Birth/Sex: [birth year] (28 y.o. Female) Treating RN: Renee Harder Primary Care Physician: Felix Pacini Other Clinician: Referring Physician: Felix Pacini Treating Physician/Extender: Rudene Re in Treatment: 0 Activities of Daily Living Items Answer Activities of Daily Living (Please select one for each item) Drive Automobile Completely Able Take Medications Completely Able Use Telephone Completely Able Care for Appearance Completely Able Use Toilet Completely Able Bath / Shower Completely Able Dress Self Completely Able Feed Self Completely Able Walk Completely Able Get In / Out Bed Completely Able Housework Completely Able Prepare Meals Completely  Able Handle Money Completely Able Shop for Self Completely Able Electronic Signature(s) Signed: 09/20/2014 4:49:26 PM By: Renee Harder RN Entered By: Renee Harder on 09/20/2014 10:01:46 Szczerba, [name] ([id number]) -------------------------------------------------------------------------------- Education Assessment Details Patient Name: [name], [name] Date of Service: 09/20/2014 9:00 AM Medical Record Number: [id number] Patient Account Number: [id number] Date of Birth/Sex: 03/05/1986 (28 y.o. Female) Treating RN: Renee Harder Primary Care Physician: Felix Pacini Other Clinician: Referring Physician: Felix Pacini Treating Physician/Extender: Rudene Re in Treatment: 0 Primary Learner Assessed: Patient Learning Preferences/Education Level/Primary Language Learning Preference: Explanation, Demonstration, Printed Material Highest Education Level: High School Preferred Language: English Cognitive Barrier Assessment/Beliefs Language Barrier: No Translator Needed: No Memory Deficit: No Emotional Barrier: No Cultural/Religious Beliefs Affecting Medical No Care: Physical Barrier Assessment Impaired Vision: No Impaired Hearing: No Decreased Hand dexterity: No Knowledge/Comprehension Assessment Knowledge Level: Medium Comprehension Level: Medium Ability to understand written Medium instructions: Ability to understand verbal Medium instructions: Motivation Assessment Anxiety Level: Anxious Cooperation: Cooperative Education Importance: Acknowledges Need Interest in Health Problems: Asks Questions Perception: Coherent Willingness to Engage in Self- Medium Management Activities: Readiness to Engage in Self- Medium Management Activities: Electronic Signature(s) JONITA, HIROTA ([id number]) Signed: 09/20/2014 4:49:26 PM By: Renee Harder RN Entered By: Renee Harder on 09/20/2014 10:02:07 [name]  ([id number]) -------------------------------------------------------------------------------- Fall Risk Assessment Details Patient Name: [name] Date of Service: 09/20/2014 9:00 AM Medical Record Number: [id number] Patient Account Number: [id number] Date of Birth/Sex: [birth year] (28 y.o. Female) Treating RN: Renee Harder Primary Care Physician: Felix Pacini Other Clinician: Referring Physician: Felix Pacini Treating Physician/Extender: Rudene Re in Treatment: 0 Fall Risk Assessment Items FALL RISK ASSESSMENT: History of falling - immediate or within 3 months 0 No Secondary diagnosis 0 No Ambulatory aid None/bed rest/wheelchair/nurse 0 Yes Crutches/cane/walker 0 No Furniture 0 No IV Access/Saline Lock 0 No Gait/Training Normal/bed rest/immobile 0 Yes Weak 0 No Impaired 0 No Mental Status Oriented to own ability 0 Yes  Electronic Signature(s) Signed: 09/20/2014 4:49:26 PM By: Renee HarderMabry, Kelsey RN Entered By: Renee HarderMabry, Kelsey on 09/20/2014 10:02:16 Hitchman, [name] ([id number]) -------------------------------------------------------------------------------- Foot Assessment Details Patient Name: [name], [name] Date of Service: 09/20/2014 9:00 AM Medical Record Number: [id number] Patient Account Number: [id number] Date of Birth/Sex: 04/13/1986 (28 y.o. Female) Treating RN: Renee HarderMabry, Kelsey Primary Care Physician: Felix PaciniKUNEFF, RENEE Other Clinician: Referring Physician: Felix PaciniKUNEFF, RENEE Treating Physician/Extender: [name], Errol Weeks in Treatment: 0 Foot Assessment Items Site Locations + = Sensation present, - = Sensation absent, C = Callus, U = Ulcer R = Redness, W = Warmth, M = Maceration, PU = Pre-ulcerative lesion F = Fissure, S = Swelling, D = Dryness Assessment Right: Left: Other Deformity: No No Prior Foot Ulcer: No No Prior Amputation: No No Charcot Joint: No No Ambulatory Status: Ambulatory Without Help Gait: Steady Electronic Signature(s) Signed: 09/20/2014  4:49:26 PM By: Renee HarderMabry, Kelsey RN Entered By: Renee HarderMabry, Kelsey on 09/20/2014 10:05:48 Boisclair, [name] ([id number]) -------------------------------------------------------------------------------- Nutrition Risk Assessment Details Patient Name: [name], [name] Date of Service: 09/20/2014 9:00 AM Medical Record Number: [id number] Patient Account Number: [id number] Date of Birth/Sex: 09/17/1985 (28 y.o. Female) Treating RN: Renee HarderMabry, Kelsey Primary Care Physician: Felix PaciniKUNEFF, RENEE Other Clinician: Referring Physician: KUNEFF, RENEE Treating Physician/Extender: [name], Errol Weeks in Treatment: 0 Height (in): 66 Weight (lbs): 350.3 Body Mass Index (BMI): 56.5 Nutrition Risk Assessment Items NUTRITION RISK SCREEN: I have an illness or condition that made me change the kind and/or 0 No amount of food I eat I eat fewer than two meals per day 3 Yes I eat few fruits and vegetables, or milk products 2 Yes I have three or more drinks of beer, liquor or wine almost every day 0 No I have tooth or mouth problems that make it hard for me to eat 0 No I don't always have enough money to buy the food I need 0 No I eat alone most of the time 0 No I take three or more different prescribed or over-the-counter drugs a 1 Yes day Without wanting to, I have lost or gained 10 pounds in the last six 0 No months I am not always physically able to shop, cook and/or feed myself 0 No Nutrition Protocols Good Risk Protocol Moderate Risk Protocol Provide education on High Risk Proctocol 0 Electronic Signature(s) Signed: 09/20/2014 4:49:26 PM By: Renee HarderMabry, Kelsey RN Entered By: Renee HarderMabry, Kelsey on 09/20/2014 10:03:29

## 2014-09-21 NOTE — Progress Notes (Addendum)
[name], Bonney J. ([id number]) Visit Report for 09/20/2014 Allergy List Details Patient Name: [name], [name] Date of Service: 09/20/2014 9:00 AM Medical Record Number: [id number] Patient Account Number: [id number] Date of Birth/Sex: 10/26/1985 (29 y.o. Female) Treating RN: Renee HarderMabry, Kelsey Primary Care Physician: Felix PaciniKUNEFF, RENEE Other Clinician: Referring Physician: Felix PaciniKUNEFF, RENEE Treating Physician/Extender: [name], Errol Weeks in Treatment: 0 Allergies Active Allergies NKA Allergy Notes Electronic Signature(s) Signed: 09/20/2014 4:49:26 PM By: Renee HarderMabry, Kelsey RN Entered By: Renee HarderMabry, Kelsey on 09/20/2014 09:39:35 Vasseur, [name] ([id number]) -------------------------------------------------------------------------------- Arrival Information Details Patient Name: [name], [name] Date of Service: 09/20/2014 9:00 AM Medical Record Number: [id number] Patient Account Number: [id number] Date of Birth/Sex: 07/06/1985 (29 y.o. Female) Treating RN: Renee HarderMabry, Kelsey Primary Care Physician: Felix PaciniKUNEFF, RENEE Other Clinician: Referring Physician: Felix PaciniKUNEFF, RENEE Treating Physician/Extender: [name], Errol Weeks in Treatment: 0 Visit Information Patient Arrived: Ambulatory Arrival Time: 09:24 Accompanied By: self Transfer Assistance: None Patient Identification Verified: Yes Secondary Verification Process Yes Completed: Patient Has Alerts: Yes Patient Alerts: 09/2013 ABI R: 1.3 Electronic Signature(s) Signed: 09/21/2014 12:03:04 PM By: Curtis Sitesorthy, Joanna Previous Signature: 09/20/2014 5:11:42 PM Version By: Curtis Sitesorthy, Joanna Entered By: Curtis Sitesorthy, Joanna on 09/21/2014 12:03:04 Hurd, [name] ([id number]) -------------------------------------------------------------------------------- Clinic Level of Care Assessment Details Patient Name: [name], [name] Date of Service: 09/20/2014 9:00 AM Medical Record Number: [id number] Patient Account Number: [id number] Date of Birth/Sex: 07/15/1985 (29 y.o. Female) Treating RN:  Curtis Sitesorthy, Joanna Primary Care Physician: Felix PaciniKUNEFF, RENEE Other Clinician: Referring Physician: Felix PaciniKUNEFF, RENEE Treating Physician/Extender: [name], Errol Weeks in Treatment: 0 Clinic Level of Care Assessment Items TOOL 2 Quantity Score []  - Use when only an EandM is performed on the INITIAL visit 0 ASSESSMENTS - Nursing Assessment / Reassessment X - General Physical Exam (combine w/ comprehensive assessment (listed just 1 20 below) when performed on new pt. evals) X - Comprehensive Assessment (HX, ROS, Risk Assessments, Wounds Hx, etc.) 1 25 ASSESSMENTS - Wound and Skin Assessment / Reassessment X - Simple Wound Assessment / Reassessment - one wound 1 5 []  - Complex Wound Assessment / Reassessment - multiple wounds 0 []  - Dermatologic / Skin Assessment (not related to wound area) 0 ASSESSMENTS - Ostomy and/or Continence Assessment and Care []  - Incontinence Assessment and Management 0 []  - Ostomy Care Assessment and Management (repouching, etc.) 0 PROCESS - Coordination of Care X - Simple Patient / Family Education for ongoing care 1 15 []  - Complex (extensive) Patient / Family Education for ongoing care 0 X - Staff obtains ChiropractorConsents, Records, Test Results / Process Orders 1 10 []  - Staff telephones HHA, Nursing Homes / Clarify orders / etc 0 []  - Routine Transfer to another Facility (non-emergent condition) 0 []  - Routine Hospital Admission (non-emergent condition) 0 X - New Admissions / Manufacturing engineernsurance Authorizations / Ordering NPWT, Apligraf, etc. 1 15 []  - Emergency Hospital Admission (emergent condition) 0 X - Simple Discharge Coordination 1 10 Jerger, Jasara J. ([id number]) []  - Complex (extensive) Discharge Coordination 0 PROCESS - Special Needs []  - Pediatric / Minor Patient Management 0 []  - Isolation Patient Management 0 []  - Hearing / Language / Visual special needs 0 []  - Assessment of Community assistance (transportation, D/C planning, etc.) 0 []  - Additional assistance / Altered  mentation 0 []  - Support Surface(s) Assessment (bed, cushion, seat, etc.) 0 INTERVENTIONS - Wound Cleansing / Measurement X - Wound Imaging (photographs - any number of wounds) 1 5 []  - Wound Tracing (instead of photographs) 0 X - Simple Wound Measurement - one wound 1 5 []  - Complex  Wound Measurement - multiple wounds 0 X - Simple Wound Cleansing - one wound 1 5 []  - Complex Wound Cleansing - multiple wounds 0 INTERVENTIONS - Wound Dressings []  - Small Wound Dressing one or multiple wounds 0 []  - Medium Wound Dressing one or multiple wounds 0 X - Large Wound Dressing one or multiple wounds 1 20 []  - Application of Medications - injection 0 INTERVENTIONS - Miscellaneous []  - External ear exam 0 []  - Specimen Collection (cultures, biopsies, blood, body fluids, etc.) 0 []  - Specimen(s) / Culture(s) sent or taken to Lab for analysis 0 []  - Patient Transfer (multiple staff / Michiel Sites Lift / Similar devices) 0 []  - Simple Staple / Suture removal (25 or less) 0 []  - Complex Staple / Suture removal (26 or more) 0 Frenette, Relena J. ([id number]) []  - Hypo / Hyperglycemic Management (close monitor of Blood Glucose) 0 X - Ankle / Brachial Index (ABI) - do not check if billed separately 1 15 Has the patient been seen at the hospital within the last three years: Yes Total Score: 150 Level Of Care: New/Established - Level 4 Electronic Signature(s) Signed: 09/20/2014 10:40:37 AM By: Curtis Sites Entered By: Curtis Sites on 09/20/2014 10:40:36 Gajda, [name] -------------------------------------------------------------------------------- Lower Extremity Assessment Details Patient Name: [name], [name] Date of Service: 09/20/2014 9:00 AM Medical Record Number: [id number] Patient Account Number: [id number] Date of Birth/Sex: [birth year] (29 y.o. Female) Treating RN: Renee Harder Primary Care Physician: Felix Pacini Other Clinician: Referring Physician: Felix Pacini Treating  Physician/Extender: Rudene Re in Treatment: 0 Edema Assessment Assessed: [Left: Yes] [Right: Yes] Edema: [Left: Yes] [Right: Yes] Calf Left: Right: Point of Measurement: 34 cm From Medial Instep 57.2 cm 55.7 cm Ankle Left: Right: Point of Measurement: 10 cm From Medial Instep 29 cm 29.8 cm Vascular Assessment Claudication: Claudication Assessment [Left:None] [Right:None] Pulses: Posterior Tibial Palpable: [Left:Yes] [Right:Yes] Doppler: [Left:Multiphasic] [Right:Multiphasic] Dorsalis Pedis Palpable: [Left:Yes] [Right:Yes] Doppler: [Left:Multiphasic] [Right:Multiphasic] Extremity colors, hair growth, and conditions: Extremity Color: [Left:Hyperpigmented] [Right:Hyperpigmented] Hair Growth on Extremity: [Left:No] [Right:No] Temperature of Extremity: [Left:Warm] [Right:Warm] Capillary Refill: [Left:< 3 seconds] [Right:< 3 seconds] Dependent Rubor: [Left:No] [Right:No] Blanched when Elevated: [Left:No] [Right:No] Blood Pressure: Brachial: [Right:100] Dorsalis Pedis: [Left:Dorsalis Pedis: 130] Ankle: Posterior Tibial: [Left:Posterior Tibial: 124] [Right:1.30] Toe Nail Assessment Left: Right: LIYANA, SUNIGA ([id number]) Thick: No No Discolored: No No Deformed: No No Improper Length and Hygiene: No No Notes (L) ABI not performed today due to pt being late/time constraint- no wounds noted on LLE Electronic Signature(s) Signed: 09/20/2014 4:49:26 PM By: Renee Harder RN Entered By: Renee Harder on 09/20/2014 09:49:22 Schoeller, [name] ([id number]) -------------------------------------------------------------------------------- Multi Wound Chart Details Patient Name: LOANY, NEUROTH Date of Service: 09/20/2014 9:00 AM Medical Record Number: [id number] Patient Account Number: [id number] Date of Birth/Sex: [birth year] (29 y.o. Female) Treating RN: Curtis Sites Primary Care Physician: Felix Pacini Other Clinician: Referring Physician: KUNEFF, RENEE Treating  Physician/Extender: Rudene Re in Treatment: 0 Vital Signs Height(in): 66 Pulse(bpm): 90 Weight(lbs): 350.3 Blood Pressure 134/58 (mmHg): Body Mass Index(BMI): 57 Temperature(F): 98.1 Respiratory Rate 16 (breaths/min): Photos: [1:No Photos] [N/A:N/A] Wound Location: [1:Right Malleolus] [N/A:N/A] Wounding Event: [1:Not Known] [N/A:N/A] Primary Etiology: [1:To be determined] [N/A:N/A] Date Acquired: [1:09/06/2014] [N/A:N/A] Weeks of Treatment: [1:0] [N/A:N/A] Wound Status: [1:Open] [N/A:N/A] Measurements L x W x D 1.4x2x0.3 [N/A:N/A] (cm) Area (cm) : [1:2.199] [N/A:N/A] Volume (cm) : [1:0.66] [N/A:N/A] % Reduction in Area: [1:0.00%] [N/A:N/A] % Reduction in Volume: 0.00% [N/A:N/A] Classification: [1:Full Thickness Without Exposed Support Structures] [N/A:N/A] Exudate Amount: [  1:Medium] [N/A:N/A] Exudate Type: [1:Serosanguineous] [N/A:N/A] Exudate Color: [1:red, brown] [N/A:N/A] Wound Margin: [1:Distinct, outline attached] [N/A:N/A] Granulation Amount: [1:Medium (34-66%)] [N/A:N/A] Granulation Quality: [1:Red] [N/A:N/A] Necrotic Amount: [1:Medium (34-66%)] [N/A:N/A] Exposed Structures: [1:Fascia: No Fat: No Tendon: No Muscle: No Joint: No Bone: No] [N/A:N/A] Limited to Skin Breakdown Epithelialization: None N/A N/A Periwound Skin Texture: Edema: Yes N/A N/A Excoriation: No Induration: No Callus: No Crepitus: No Fluctuance: No Friable: No Rash: No Scarring: No Periwound Skin Maceration: Yes N/A N/A Moisture: Moist: Yes Dry/Scaly: No Periwound Skin Color: Hemosiderin Staining: Yes N/A N/A Atrophie Blanche: No Cyanosis: No Ecchymosis: No Erythema: No Mottled: No Pallor: No Rubor: No Temperature: No Abnormality N/A N/A Tenderness on Yes N/A N/A Palpation: Wound Preparation: Ulcer Cleansing: N/A N/A Rinsed/Irrigated with Saline Topical Anesthetic Applied: Other: Lidocaine 4% Ointment Treatment Notes Electronic Signature(s) Signed: 09/20/2014  5:11:42 PM By: Curtis Sites Entered By: Curtis Sites on 09/20/2014 10:23:13 Rys, [name] -------------------------------------------------------------------------------- Multi-Disciplinary Care Plan Details Patient Name: IMARA, STANDIFORD. Date of Service: 09/20/2014 9:00 AM Medical Record Number: [id number] Patient Account Number: [id number] Date of Birth/Sex: 01-Aug-1985 (29 y.o. Female) Treating RN: Curtis Sites Primary Care Physician: Felix Pacini Other Clinician: Referring Physician: Felix Pacini Treating Physician/Extender: Rudene Re in Treatment: 0 Active Inactive Abuse / Safety / Falls / Self Care Management Nursing Diagnoses: Potential for falls Goals: Patient will remain injury free Date Initiated: 09/20/2014 Goal Status: Active Interventions: Assess fall risk on admission and as needed Notes: Orientation to the Wound Care Program Nursing Diagnoses: Knowledge deficit related to the wound healing center program Goals: Patient/caregiver will verbalize understanding of the Wound Healing Center Program Date Initiated: 09/20/2014 Goal Status: Active Interventions: Provide education on orientation to the wound center Notes: Pain, Acute or Chronic Nursing Diagnoses: Pain Management - Cyclic Acute (Dressing Change Related) Goals: Patient will verbalize adequate pain control and receive pain control interventions during procedures as needed NYARA, CAPELL ([id number]) Date Initiated: 09/20/2014 Goal Status: Active Interventions: Complete pain assessment as per visit requirements Notes: Venous Leg Ulcer Nursing Diagnoses: Potential for venous Insuffiency (use before diagnosis confirmed) Goals: Non-invasive venous studies are completed as ordered Date Initiated: 09/20/2014 Goal Status: Active Interventions: Assess peripheral edema status every visit. Notes: Wound/Skin Impairment Nursing Diagnoses: Impaired tissue  integrity Goals: Ulcer/skin breakdown will have a volume reduction of 30% by week 4 Date Initiated: 09/20/2014 Goal Status: Active Interventions: Assess ulceration(s) every visit Notes: Electronic Signature(s) Signed: 09/20/2014 5:11:42 PM By: Curtis Sites Entered By: Curtis Sites on 09/20/2014 10:22:37 Balgobin, [name] ([id number]) -------------------------------------------------------------------------------- Pain Assessment Details Patient Name: YANETH, FAIRBAIRN. Date of Service: 09/20/2014 9:00 AM Medical Record Number: [id number] Patient Account Number: [id number] Date of Birth/Sex: [birth year] (29 y.o. Female) Treating RN: Renee Harder Primary Care Physician: Felix Pacini Other Clinician: Referring Physician: Felix Pacini Treating Physician/Extender: Rudene Re in Treatment: 0 Active Problems Location of Pain Severity and Description of Pain Patient Has Paino Yes Site Locations Pain Location: Pain in Ulcers With Dressing Change: Yes Duration of the Pain. Constant / Intermittento Constant Rate the pain. Current Pain Level: 4 Worst Pain Level: 10 Least Pain Level: 1 Character of Pain Describe the Pain: Splitting Pain Management and Medication Current Pain Management: Electronic Signature(s) Signed: 09/20/2014 4:49:26 PM By: Renee Harder RN Entered By: Renee Harder on 09/20/2014 09:28:07 Dubois, [name] ([id number]) -------------------------------------------------------------------------------- Patient/Caregiver Education Details Patient Name: SIDONIE, DEXHEIMER. Date of Service: 09/20/2014 9:00 AM Medical Record Number: [id number] Patient Account Number: [id number] Date of Birth/Gender: [birth year] (29 y.o. Female)  Treating RN: Curtis Sites Primary Care Physician: Felix Pacini Other Clinician: Referring Physician: Felix Pacini Treating Physician/Extender: Rudene Re in Treatment: 0 Education Assessment Education Provided  To: Patient Education Topics Provided Venous: Handouts: Other: wraps and what to expect Methods: Demonstration, Explain/Verbal Responses: State content correctly Wound/Skin Impairment: Handouts: Caring for Your Ulcer Methods: Demonstration, Explain/Verbal Responses: State content correctly Electronic Signature(s) Signed: 09/20/2014 5:11:42 PM By: Curtis Sites Entered By: Curtis Sites on 09/20/2014 10:30:46 Deisher, [name] ([id number]) -------------------------------------------------------------------------------- Wound Assessment Details Patient Name: MIRRA, BASILIO. Date of Service: 09/20/2014 9:00 AM Medical Record Number: [id number] Patient Account Number: [id number] Date of Birth/Sex: 1986-03-11 (29 y.o. Female) Treating RN: Renee Harder Primary Care Physician: Felix Pacini Other Clinician: Referring Physician: Felix Pacini Treating Physician/Extender: Rudene Re in Treatment: 0 Wound Status Wound Number: 1 Primary Etiology: To be determined Wound Location: Right Malleolus Wound Status: Open Wounding Event: Not Known Date Acquired: 09/06/2014 Weeks Of Treatment: 0 Clustered Wound: No Photos Photo Uploaded By: Renee Harder on 09/20/2014 16:45:47 Wound Measurements Length: (cm) 1.4 Width: (cm) 2 Depth: (cm) 0.3 Area: (cm) 2.199 Volume: (cm) 0.66 % Reduction in Area: 0% % Reduction in Volume: 0% Epithelialization: None Tunneling: No Undermining: No Wound Description Full Thickness Without Exposed Classification: Support Structures Wound Margin: Distinct, outline attached Exudate Medium Amount: DELYNN, OLVERA ([id number]) Foul Odor After Cleansing: No Exudate Type: Serosanguineous Exudate Color: red, brown Wound Bed Granulation Amount: Medium (34-66%) Exposed Structure Granulation Quality: Red Fascia Exposed: No Necrotic Amount: Medium (34-66%) Fat Layer Exposed: No Necrotic Quality: Adherent Slough Tendon Exposed: No Muscle  Exposed: No Joint Exposed: No Bone Exposed: No Limited to Skin Breakdown Periwound Skin Texture Texture Color No Abnormalities Noted: No No Abnormalities Noted: No Callus: No Atrophie Blanche: No Crepitus: No Cyanosis: No Excoriation: No Ecchymosis: No Fluctuance: No Erythema: No Friable: No Hemosiderin Staining: Yes Induration: No Mottled: No Localized Edema: Yes Pallor: No Rash: No Rubor: No Scarring: No Temperature / Pain Moisture Temperature: No Abnormality No Abnormalities Noted: No Tenderness on Palpation: Yes Dry / Scaly: No Maceration: Yes Moist: Yes Wound Preparation Ulcer Cleansing: Rinsed/Irrigated with Saline Topical Anesthetic Applied: Other: Lidocaine 4% Ointment , Electronic Signature(s) Signed: 09/20/2014 4:49:26 PM By: Renee Harder RN Entered By: Renee Harder on 09/20/2014 09:38:21 Deller, [name] ([id number]) -------------------------------------------------------------------------------- Vitals Details Patient Name: DAWNIELLE, CHRISTIANA Date of Service: 09/20/2014 9:00 AM Medical Record Number: [id number] Patient Account Number: [id number] Date of Birth/Sex: 23-Sep-1985 (29 y.o. Female) Treating RN: Renee Harder Primary Care Physician: Felix Pacini Other Clinician: Referring Physician: Felix Pacini Treating Physician/Extender: Rudene Re in Treatment: 0 Vital Signs Time Taken: 09:23 Temperature (F): 98.1 Height (in): 66 Pulse (bpm): 90 Source: Stated Respiratory Rate (breaths/min): 16 Weight (lbs): 350.3 Blood Pressure (mmHg): 134/58 Source: Measured Reference Range: 80 - 120 mg / dl Body Mass Index (BMI): 56.5 Electronic Signature(s) Signed: 09/20/2014 4:49:26 PM By: Renee Harder RN Entered By: Renee Harder on 09/20/2014 09:25:36

## 2014-09-21 NOTE — Progress Notes (Signed)
FENIX, RUPPE ([id number]) Visit Report for 09/20/2014 Chief Complaint Document Details Patient Name: [name], [name]. Date of Service: 09/20/2014 9:00 AM Medical Record Number: [id number] Patient Account Number: [id number] Date of Birth/Sex: 1985/10/11 (28 y.o. Female) Treating RN: Primary Care Physician: KUNEFF, RENEE Other Clinician: Referring Physician: Claiborne Billings, RENEE Treating Physician/Extender: Rudene Re in Treatment: 0 Information Obtained from: Patient Chief Complaint Patient presents for treatment of an open ulcer due to venous insufficiency. this 29 year old patient has had an ulcer on the lateral part of her right lower extremity and this has been there for 2 weeks. Electronic Signature(s) Signed: 09/20/2014 12:34:34 PM By: Evlyn Kanner MD, FACS Entered By: Evlyn Kanner on 09/20/2014 10:40:10 [name] ([id number]) -------------------------------------------------------------------------------- Debridement Details Patient Name: [name], [name]. Date of Service: 09/20/2014 9:00 AM Medical Record Number: [id number] Patient Account Number: [id number] Date of Birth/Sex: 1985/09/23 (28 y.o. Female) Treating RN: Primary Care Physician: KUNEFF, RENEE Other Clinician: Referring Physician: KUNEFF, RENEE Treating Physician/Extender: Rudene Re in Treatment: 0 Debridement Performed for Wound #1 Right Malleolus Assessment: Performed By: Physician Tristan Schroeder., MD Debridement: Open Wound/Selective Debridement Selective Description: Pre-procedure Yes Verification/Time Out Taken: Start Time: 10:24 Pain Control: Lidocaine 4% Topical Solution Level: Non-Viable Tissue Total Area Debrided (L x 1.4 (cm) x 2 (cm) = 2.8 (cm) W): Tissue and other Non-Viable, Eschar, Exudate, Fibrin/Slough, Skin material debrided: Instrument: Other : gauze and saline Bleeding: None End Time: 10:26 Procedural Pain: 0 Post Procedural Pain: 0 Response to Treatment:  Procedure was tolerated well Post Debridement Measurements of Total Wound Length: (cm) 1.4 Width: (cm) 2 Depth: (cm) 0.3 Volume: (cm) 0.66 Electronic Signature(s) Signed: 09/20/2014 12:34:34 PM By: Evlyn Kanner MD, FACS Entered By: Evlyn Kanner on 09/20/2014 10:39:12 [name], [name] ([id number]) -------------------------------------------------------------------------------- HPI Details Patient Name: [name] Date of Service: 09/20/2014 9:00 AM Medical Record Number: [id number] Patient Account Number: [id number] Date of Birth/Sex: 11/04/1985 (28 y.o. Female) Treating RN: Primary Care Physician: KUNEFF, RENEE Other Clinician: Referring Physician: KUNEFF, RENEE Treating Physician/Extender: Rudene Re in Treatment: 0 History of Present Illness Location: the lateral part of her right ankle Quality: Patient reports experiencing a dull pain to affected area(s). Severity: Patient states wound are getting worse. Duration: Patient has had the wound for < 2 weeks prior to presenting for treatment Timing: Pain in wound is Intermittent (comes and goes Context: The wound appeared gradually over time Modifying Factors: Consults to this date include:seen in the ER and asked to follow up with wound care. Associated Signs and Symptoms: Patient reports having difficulty standing for long periods. HPI Description: 29 year old patient who started with having ulcerations on the right lower leg on the lateral part of her ankle for about 2 weeks. She was seen in the ER at [facility] and advised to see the wound care for a consultation. No X-rays of workup was done during the ER visit and no prescription for any medications of compression wraps were given. the patient is not diabetic but does have hypertension and her medications have been reviewed by me. In July 2013 she was seen by renal and vascular services of [facility] and at that time a venous ultrasound was done which showed  right and left great saphenous vein incompetence with reflux of more than 500 ms. The right and left greater saphenous vein was found to be tortuous. Deep venous system was also not competent and there was reflux of more than 500 ms. She was then seen by Dr. Tawanna Cooler Early who recommended that  the patient would not benefit from endovenous ablation and he had recommended vein stripping on the right side and multiple small phlebectomy procedures on the left side. the patient did not follow-up due to social economic reasons. She has not been wearing any compression stockings and has not taken any specific treatment for varicose veins for the last 3 years. Electronic Signature(s) Signed: 09/20/2014 12:34:34 PM By: Evlyn KannerBritto, Sadie Hazelett MD, FACS Entered By: Evlyn KannerBritto, Domonic Hiscox on 09/20/2014 10:44:08 [name], [name] ([id number]) -------------------------------------------------------------------------------- Physical Exam Details Patient Name: [name], [name] Date of Service: 09/20/2014 9:00 AM Medical Record Number: [id number] Patient Account Number: [id number] Date of Birth/Sex: 03/13/1986 (28 y.o. Female) Treating RN: Primary Care Physician: KUNEFF, RENEE Other Clinician: Referring Physician: KUNEFF, RENEE Treating Physician/Extender: [name], [name] Weeks in Treatment: 0 Constitutional . Pulse regular. Respirations normal and unlabored. Afebrile. . Eyes Nonicteric. Reactive to light. Ears, Nose, Mouth, and Throat Lips, teeth, and gums WNL.Marland Kitchen. Moist mucosa without lesions . Neck supple and nontender. No palpable supraclavicular or cervical adenopathy. Normal sized without goiter. Respiratory WNL. No retractions.. Cardiovascular Pedal Pulses WNL. ABI on the right side is 1.3. she has got edema of both lower extremities with typical symptoms of venous stasis on both lower extremities.. Musculoskeletal Adexa without tenderness or enlargement.. Digits and nails w/o clubbing, cyanosis, infection,  petechiae, ischemia, or inflammatory conditions.. Integumentary (Hair, Skin) the patient has an open ulcer on the lateral part of the right ankle which is covered with slough and has some fibrin. This would need sharp debridement.. No crepitus or fluctuance. No peri-wound warmth or erythema. No masses.Marland Kitchen. Psychiatric Judgement and insight Intact.. No evidence of depression, anxiety, or agitation.. Electronic Signature(s) Signed: 09/20/2014 12:34:34 PM By: Evlyn KannerBritto, Rodina Pinales MD, FACS Entered By: Evlyn KannerBritto, Redell Nazir on 09/20/2014 10:46:12 Stanislaw, [name] ([id number]) -------------------------------------------------------------------------------- Physician Orders Details Patient Name: [name], [name] Date of Service: 09/20/2014 9:00 AM Medical Record Number: [id number] Patient Account Number: [id number] Date of Birth/Sex: 02/14/1986 (28 y.o. Female) Treating RN: Curtis Sitesorthy, Joanna Primary Care Physician: Felix PaciniKUNEFF, RENEE Other Clinician: Referring Physician: KUNEFF, RENEE Treating Physician/Extender: [name], Samaa Ueda Weeks in Treatment: 0 Verbal / Phone Orders: Yes Clinician: Curtis Sitesorthy, Joanna Read Back and Verified: Yes Diagnosis Coding Wound Cleansing Wound #1 Right Malleolus o Cleanse wound with mild soap and water Anesthetic Wound #1 Right Malleolus o Topical Lidocaine 4% cream applied to wound bed prior to debridement Primary Wound Dressing Wound #1 Right Malleolus o Aquacel Ag Secondary Dressing Wound #1 Right Malleolus o ABD pad Dressing Change Frequency Wound #1 Right Malleolus o Change dressing every week Follow-up Appointments Wound #1 Right Malleolus o Return Appointment in 1 week. Edema Control Wound #1 Right Malleolus o Unna Boot to Right Lower Extremity Additional Orders / Instructions Wound #1 Right Malleolus o Other: - continue working on [employer] [name], Scott J. ([id number]) o Venous Studies -Bilateral oooo Electronic  Signature(s) Signed: 09/20/2014 12:34:34 PM By: Evlyn KannerBritto, Ramesses Crampton MD, FACS Signed: 09/20/2014 5:11:42 PM By: Curtis Sitesorthy, Joanna Entered By: Curtis Sitesorthy, Joanna on 09/20/2014 10:29:39 Newlun, [name] ([id number]) -------------------------------------------------------------------------------- Problem List Details Patient Name: [name], [name] Date of Service: 09/20/2014 9:00 AM Medical Record Number: [id number] Patient Account Number: [id number] Date of Birth/Sex: 08/22/1985 (28 y.o. 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 Date of Service: 09/20/2014 9:00 AM Medical Record Number: [id number] Patient Account Number: [id number] Date of Birth/Sex: 1986/01/25 (28 y.o. Female) Treating RN: Primary Care Physician: KUNEFF, RENEE Other Clinician: Referring Physician: Claiborne Billings, RENEE Treating Physician/Extender: Rudene Re in Treatment: 0 Subjective Chief Complaint Information obtained from Patient Patient presents for treatment of an open ulcer due to venous insufficiency. this 29 year old patient has had an ulcer on the lateral part of her right lower extremity and this has been there for 2 weeks. History of Present Illness  (HPI) The following HPI elements were documented for the patient's wound: Location: the lateral part of her right ankle Quality: Patient reports experiencing a dull pain to affected area(s). Severity: Patient states wound are getting worse. Duration: Patient has had the wound for < 2 weeks prior to presenting for treatment Timing: Pain in wound is Intermittent (comes and goes Context: The wound appeared gradually over time Modifying Factors: Consults to this date include:seen in the ER and asked to follow up with wound care. Associated Signs and Symptoms: Patient reports having difficulty standing for long periods. 29 year old patient who started with having ulcerations on the right lower leg on the lateral part of her ankle for about 2 weeks. She was seen in the ER at [facility] and advised to see the wound care for a consultation. No X-rays of workup was done during the ER visit and no prescription for any medications of compression wraps were given. the patient is not diabetic but does have hypertension and her medications have been reviewed by me. In July 2013 she was seen by renal and vascular services of [facility] and at that time a venous ultrasound was done which showed right and left great saphenous vein incompetence with reflux of more than 500 ms. The right and left greater saphenous vein was found to be tortuous. Deep venous system was also not competent and there was reflux of more than 500 ms. She was then seen by Dr. Tawanna Cooler Early who recommended that the patient would not benefit from endovenous ablation and he had recommended vein stripping on the right side and multiple small phlebectomy procedures on the left side. the patient did not follow-up due to social economic reasons. She has not been wearing any compression stockings and has not taken any specific treatment for varicose veins for the last 3 years. Patient History Allergies NKA JENNYFER, NICKOLSON  ([id number]) Medications metronidazole 500 mg tablet oral tablet oral acetaminophen 500 mg tablet oral tablet oral fluconazole 150 mg tablet oral tablet oral norgestimate 0.25 mg-ethinyl estradiol 35 mcg tablet oral tablet oral prednisolone acetate 1 % eye drops,suspension ophthalmic drops,suspension ophthalmic cyclopentolate 1 % eye drops ophthalmic drops ophthalmic ibuprofen 600 mg tablet oral tablet oral naproxen 500 mg tablet oral tablet oral polymyxin B sulfate 10,000 unit-trimethoprim 1 mg/mL eye drops ophthalmic drops ophthalmic cyclobenzaprine 5 mg tablet oral tablet oral hydrochlorothiazide 12.5 mg tablet oral tablet oral Objective Constitutional Pulse regular. Respirations normal and unlabored. Afebrile. Vitals Time Taken: 9:23 AM, Height: 66 in, Source: Stated, Weight: 350.3 lbs, Source: Measured, BMI: 56.5, Temperature: 98.1 F, Pulse: 90 bpm, Respiratory Rate: 16 breaths/min, Blood Pressure: 134/58 mmHg. Eyes Nonicteric. Reactive to light. Ears, Nose, Mouth, and Throat  Lips, teeth, and gums WNL.Marland Kitchen Moist mucosa without lesions . Neck supple and nontender. No palpable supraclavicular or cervical adenopathy. Normal sized without goiter. Respiratory WNL. No retractions.. Cardiovascular Pedal Pulses WNL. ABI on the right side is 1.3. she has got edema of both lower extremities with typical symptoms of venous stasis on both lower extremities.. Musculoskeletal [name], [name] ([id number]) Adexa without tenderness or enlargement.. Digits and nails w/o clubbing, cyanosis, infection, petechiae, ischemia, or inflammatory conditions.Marland Kitchen Psychiatric Judgement and insight Intact.. No evidence of depression, anxiety, or agitation.. Integumentary (Hair, Skin) the patient has an open ulcer on the lateral part of the right ankle which is covered with slough and has some fibrin. This would need sharp debridement.. No crepitus or fluctuance. No peri-wound warmth or erythema. No  masses.. Wound #1 status is Open. Original cause of wound was Not Known. The wound is located on the Right Malleolus. The wound measures 1.4cm length x 2cm width x 0.3cm depth; 2.199cm^2 area and 0.66cm^3 volume. The wound is limited to skin breakdown. There is no tunneling or undermining noted. There is a medium amount of serosanguineous drainage noted. The wound margin is distinct with the outline attached to the wound base. There is medium (34-66%) red granulation within the wound bed. There is a medium (34-66%) amount of necrotic tissue within the wound bed including Adherent Slough. The periwound skin appearance exhibited: Localized Edema, Maceration, Moist, Hemosiderin Staining. The periwound skin appearance did not exhibit: Callus, Crepitus, Excoriation, Fluctuance, Friable, Induration, Rash, Scarring, Dry/Scaly, Atrophie Blanche, Cyanosis, Ecchymosis, Mottled, Pallor, Rubor, Erythema. Periwound temperature was noted as No Abnormality. The periwound has tenderness on palpation. 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 Risk benefits and alternatives application of the owners boots has been discussed with her in great detail and she understands she will come back to see me at weekly intervals. She will also return sooner than that if there is any change in owners boots or any symptoms of tingling numbness of her toes turning blue. I have also asked her to see Dr. Tawanna Cooler Early as soon as possible to restart his treatment of the right lower extremity and the left lower extremity varicose veins. she says she will be compliant. [name], ROTHLISBERGER ([id number]) Procedures Wound #1 Wound #1 is a To be  determined located on the Right Malleolus . There was a Non-Viable Tissue Open Wound/Selective [phone]) debridement with total area of 2.8 sq cm performed by Tristan Schroeder., MD. with the following instrument(s): gauze and saline to remove Non-Viable tissue/material including Exudate, Fibrin/Slough, Eschar, and Skin after achieving pain control using Lidocaine 4% Topical Solution. A time out was conducted prior to the start of the procedure. There was no bleeding. The procedure was tolerated well with a pain level of 0 throughout and a pain level of 0 following the procedure. Post Debridement Measurements: 1.4cm length x 2cm width x 0.3cm depth; 0.66cm^3 volume. Plan Wound Cleansing: Wound #1 Right Malleolus: Cleanse wound with mild soap and water Anesthetic: Wound #1 Right Malleolus: Topical Lidocaine 4% cream applied to wound bed prior to debridement Primary Wound Dressing: Wound #1 Right Malleolus: Aquacel Ag Secondary Dressing: Wound #1 Right Malleolus: ABD pad Dressing Change Frequency: Wound #1 Right Malleolus: Change  dressing every week Follow-up Appointments: Wound #1 Right Malleolus: Return Appointment in 1 week. Edema Control: Wound #1 Right Malleolus: Unna Boot to Right Lower Extremity Additional Orders / Instructions: Wound #1 Right Malleolus: Other: - continue working on [employer] and Therapies ordered were: Venous Studies -Bilateral Honeyman, Reina J. ([id number]) I have recommended calcium alginate silver over the ulceration and the wound was brought to be applied. Risk benefits and alternatives application of the owners boots has been discussed with her in great detail and she understands she will come back to see me at weekly intervals. She will also return sooner than that if there is any change in owners boots or any symptoms of tingling numbness of her toes turning blue. I have also asked her to see Dr. Tawanna Cooler Early as soon as possible to  restart his treatment of the right lower extremity and the left lower extremity varicose veins. she says she will be compliant. Electronic Signature(s) Signed: 09/20/2014 12:34:34 PM By: Evlyn Kanner MD, FACS Entered By: Evlyn Kanner on 09/20/2014 10:49:22 Parmer, [name] ([id number]) -------------------------------------------------------------------------------- ROS/PFSH Details Patient Name: VASHTI, BOLANOS. Date of Service: 09/20/2014 9:00 AM Medical Record Number: [id number] Patient Account Number: [id number] Date of Birth/Sex: Mar 06, 1986 (28 y.o. Female) Treating RN: Primary Care Physician: KUNEFF, RENEE Other Clinician: Referring Physician: KUNEFF, RENEE Treating Physician/Extender: Rudene Re in Treatment: 0 Wound History Physician Affirmation I have reviewed and agree with the above information. Electronic Signature(s) Signed: 09/20/2014 12:34:34 PM By: Evlyn Kanner MD, FACS Entered By: Evlyn Kanner on 09/20/2014 10:39:34 Hanken, [name] ([id number]) -------------------------------------------------------------------------------- SuperBill Details Patient Name: SHANNEN, [place]ANSBUR[place]. Date of Service: 09/20/2014 Medical Record Number: [id number] Patient Account Number: [id number] Date of Birth/Sex: 14-Mar-1986 (28 y.o. 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 E66.01 Morbid (severe) obesity due to excess calories Facility Procedures CPT4 Code Description: [id number] 99214 - WOUND CARE VISIT-LEV 4 EST PT Modifier: Quantity: 1 CPT4 Code Description: [id number] - DEBRIDE WOUND 1ST 20 SQ CM OR < ICD-10 Description Diagnosis I83.013 Varicose veins of  right lower extremity with ulcer L97.312 Non-pressure chronic ulcer of right ankle with fat E66.01 Morbid (severe) obesity due  to excess calories Modifier: of ankle layer expose Quantity: 1 d Physician Procedures CPT4 Code Description: [id number] - WC PHYS LEVEL 4 - NEW PT ICD-10 Description Diagnosis I83.013 Varicose veins of right lower extremity with ulcer L97.312 Non-pressure chronic ulcer of right ankle with fat E66.01 Morbid (severe) obesity due to  excess calories Modifier: of ankle layer expose Quantity: 1 d CPT4 Code Description: [id number] 97597 - WC PHYS DEBR WO ANESTH 20 SQ CM ICD-10 Description Diagnosis I83.013 Varicose veins of right lower extremity with ulcer L97.312 Non-pressure chronic ulcer of right ankle with fat E66.01 Morbid (severe) obesity due  to excess calories GHINA, BITTINGER. ([id number]) Modifier: of ankle layer expose Quantity: 1 d Electronic Signature(s) Signed: 09/20/2014 12:34:34 PM By: Evlyn Kanner MD, FACS Entered By: Evlyn Kanner on 09/20/2014 10:49:43

## 2014-09-23 ENCOUNTER — Other Ambulatory Visit: Payer: Self-pay | Admitting: *Deleted

## 2014-09-23 DIAGNOSIS — I83009 Varicose veins of unspecified lower extremity with ulcer of unspecified site: Secondary | ICD-10-CM

## 2014-09-23 DIAGNOSIS — L97909 Non-pressure chronic ulcer of unspecified part of unspecified lower leg with unspecified severity: Secondary | ICD-10-CM

## 2014-09-27 ENCOUNTER — Encounter: Payer: BLUE CROSS/BLUE SHIELD | Admitting: Surgery

## 2014-09-27 DIAGNOSIS — L97312 Non-pressure chronic ulcer of right ankle with fat layer exposed: Secondary | ICD-10-CM | POA: Diagnosis not present

## 2014-09-27 NOTE — Progress Notes (Addendum)
[name] ([id number]) Visit Report for 09/27/2014 Chief Complaint Document Details Patient Name: [name], [name]. Date of Service: 09/27/2014 8:15 AM Medical Record Number: [id number] Patient Account Number: [id number] Date of Birth/Sex: 1985/11/25 (29 y.o. Female) Treating RN: Primary Care Physician: KUNEFF, RENEE Other Clinician: Referring Physician: Claiborne Billings, RENEE Treating Physician/Extender: Rudene Re in Treatment: 1 Information Obtained from: Patient Chief Complaint Patient presents for treatment of an open ulcer due to venous insufficiency. this 29 year old patient has had an ulcer on the lateral part of her right lower extremity and this has been there for 2 weeks. Electronic Signature(s) Signed: 09/27/2014 12:20:11 PM By: Evlyn Kanner MD, FACS Entered By: Evlyn Kanner on 09/27/2014 09:35:17 [name] ([id number]) -------------------------------------------------------------------------------- HPI Details Patient Name: [name], [name]. Date of Service: 09/27/2014 8:15 AM Medical Record Number: [id number] Patient Account Number: [id number] Date of Birth/Sex: January 14, 1986 (29 y.o. Female) Treating RN: Primary Care Physician: KUNEFF, RENEE Other Clinician: Referring Physician: KUNEFF, RENEE Treating Physician/Extender: Rudene Re in Treatment: 1 History of Present Illness Location: the lateral part of her right ankle Quality: Patient reports experiencing a dull pain to affected area(s). Severity: Patient states wound are getting worse. Duration: Patient has had the wound for < 2 weeks prior to presenting for treatment Timing: Pain in wound is Intermittent (comes and goes Context: The wound appeared gradually over time Modifying Factors: Consults to this date include:seen in the ER and asked to follow up with wound care. Associated Signs and Symptoms: Patient reports having difficulty standing for long periods. HPI Description: 29 year old  patient who started with having ulcerations on the right lower leg on the lateral part of her ankle for about 2 weeks. She was seen in the ER at [facility] and advised to see the wound care for a consultation. No X-rays of workup was done during the ER visit and no prescription for any medications of compression wraps were given. the patient is not diabetic but does have hypertension and her medications have been reviewed by me. 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 The right and left greater saphenous vein was found to be tortuous. Deep venous system was also not competent and there was reflux of more than 500 ms. She was then seen by Dr. Tawanna Cooler Early who recommended that the patient would not benefit from endovenous ablation and he had recommended vein stripping on the right side and multiple small phlebectomy procedures on the left side. the patient did not follow-up due to social economic reasons. She has not been wearing any compression stockings and has not taken any specific treatment for varicose veins for the last 3 years. 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 We have obtained some appointments to see the vascular surgeons by the end of the month and the patient would like to follow up with me at my [name] on Wednesday, June 29. Electronic Signature(s) Signed: 09/27/2014 12:20:11 PM By: Evlyn Kanner MD, FACS Entered By: Evlyn Kanner on 09/27/2014 09:36:15 [name] ([id number]) -------------------------------------------------------------------------------- Physical Exam Details Patient Name: [name], [name]. Date of Service: 09/27/2014 8:15 AM Medical Record Number: [id number] Patient  Account Number: [id number] Date of Birth/Sex: 1985/12/30 (29 y.o. Female) Treating RN: Primary Care Physician: KUNEFF,  RENEE Other Clinician: Referring Physician: KUNEFF, RENEE Treating Physician/Extender: Evlyn Kanner Weeks in Treatment: 1 Constitutional . Pulse regular. Respirations normal and unlabored. Afebrile. . Eyes Nonicteric. Reactive to light. Ears, Nose, Mouth, and Throat Lips, teeth, and gums WNL.Marland Kitchen Moist mucosa without lesions . Neck supple and nontender. No palpable supraclavicular or cervical adenopathy. Normal sized without goiter. Respiratory WNL. No retractions.. Cardiovascular Pedal Pulses WNL. No clubbing, cyanosis or edema. Musculoskeletal Adexa without tenderness or enlargement.. Digits and nails w/o clubbing, cyanosis, infection, petechiae, ischemia, or inflammatory conditions.. Integumentary (Hair, Skin) she has a new superficial wound on the medial malleolus area and the one on the lateral malleolus continues to have some depth to it and needs some debridement with saline gauze.Marland Kitchen No crepitus or fluctuance. No peri-wound warmth or erythema. No masses.Marland Kitchen Psychiatric Judgement and insight Intact.. No evidence of depression, anxiety, or agitation.. Electronic Signature(s) Signed: 09/27/2014 12:20:11 PM By: Evlyn Kanner MD, FACS Entered By: Evlyn Kanner on 09/27/2014 09:37:07 [name] ([id number]) -------------------------------------------------------------------------------- Physician Orders Details Patient Name: [name], [name]. Date of Service: 09/27/2014 8:15 AM Medical Record Number: [id number] Patient Account Number: [id number] Date of Birth/Sex: 20-Nov-1985 (29 y.o. 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 E66.01 Morbid (severe) obesity due to excess calories Wound Cleansing Wound #1 Right Malleolus o Cleanse wound with mild soap and water Wound #2 Right,Medial Lower Leg o Cleanse wound with mild soap and water Anesthetic Wound #1 Right Malleolus o Topical Lidocaine 4% cream applied to wound bed prior to debridement Wound #2 Right,Medial Lower Leg o Topical Lidocaine 4% cream applied to wound bed prior to debridement Primary Wound Dressing Wound #1 Right Malleolus o Aquacel Ag Wound #2 Right,Medial Lower Leg o Aquacel Ag Secondary Dressing Wound #1 Right Malleolus o ABD pad Wound #2 Right,Medial Lower Leg o ABD pad Dressing Change Frequency Wound #1 Right Malleolus Theiler, Charnika J. ([id number]) o Change dressing every week Wound #2 Right,Medial Lower Leg o Change dressing every week Follow-up Appointments Wound #1 Right Malleolus o Return Appointment in 1 week. Wound #2 Right,Medial Lower Leg o Return Appointment in 1 week. Edema Control Wound #1 Right Malleolus o Unna Boot to Right Lower Extremity Wound #2 Right,Medial Lower Leg o Unna Boot to Right Lower Extremity Additional Orders / Instructions Wound #1 Right Malleolus o Other: - continue working on weight management Wound #2 Right,Medial Lower Leg o Other: - continue working on [employer]) Signed: 09/27/2014 9:44:46 AM By: Curtis Sites Signed: 09/27/2014 12:20:11 PM By: Evlyn Kanner MD, FACS Entered By: Curtis Sites on 09/27/2014 09:44:45 Iser, [name] [name] ([id number]) -------------------------------------------------------------------------------- Problem List Details Patient Name: [name], [name]. Date of Service: 09/27/2014 8:15 AM Medical Record  Number: [id number] Patient Account Number: [id number] Date of Birth/Sex: 06/17/1985 (29 y.o. 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 Date of Service: 09/27/2014 8:15 AM Medical Record Number: [id number] Patient Account Number: [id number] Date of Birth/Sex: 1986-01-20 (29 y.o. Female) Treating RN: Primary Care Physician: KUNEFF, RENEE Other Clinician: Referring Physician: Claiborne Billings, RENEE Treating Physician/Extender: Rudene Re in Treatment: 1 Subjective Chief Complaint Information obtained from Patient Patient presents for treatment of an open ulcer due to venous insufficiency. this 29 year old patient has had an ulcer on the lateral part of her right lower extremity and this has been there for 2 weeks. History of Present Illness (HPI) The following HPI elements were documented for the patient's wound: Location: the lateral part of her right ankle Quality: Patient reports experiencing a dull pain to affected area(s). Severity: Patient states wound are getting worse. Duration: Patient has had the wound for < 2 weeks prior to presenting for treatment Timing: Pain in wound is Intermittent (comes and goes Context:  The wound appeared gradually over time Modifying Factors: Consults to this date include:seen in the ER and asked to follow up with wound care. Associated Signs and Symptoms: Patient reports having difficulty standing for long periods. 29 year old patient who started with having ulcerations on the right lower leg on the lateral part of her ankle for about 2 weeks. She was seen in the ER at [facility] and advised to see the wound care for a consultation. No X-rays of workup was done during the ER visit and no prescription for any medications of compression wraps were given. the patient is not diabetic but does have hypertension and her medications have been reviewed by me. 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 The right and left greater saphenous vein was found to be tortuous. Deep venous system was also not competent and there was reflux of more than 500 ms. She was then seen by Dr. Tawanna Cooler Early who recommended that the patient would not benefit from endovenous ablation and he had recommended vein stripping on the right side and multiple small phlebectomy procedures on the left side. the patient did not follow-up due to social economic reasons. She has not been wearing any compression stockings and has not taken any specific treatment for varicose veins for the last 3 years. 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 We have obtained some appointments to see the vascular surgeons by the end of the month and the patient would like to follow up with me at my [facility] on Wednesday, June 29. [name], [name] ([id number]) Objective Constitutional Pulse regular. Respirations normal and unlabored. Afebrile. Vitals Time  Taken: 8:55 AM, Height: 66 in, Weight: 350.3 lbs, BMI: 56.5, Temperature: 98.4 F, Pulse: 84 bpm, Respiratory Rate: 12 breaths/min, Blood Pressure: 124/75 mmHg. Eyes Nonicteric. Reactive to light. Ears, Nose, Mouth, and Throat Lips, teeth, and gums WNL.Marland Kitchen Moist mucosa without lesions . Neck supple and nontender. No palpable supraclavicular or cervical adenopathy. Normal sized without goiter. Respiratory WNL. No retractions.. Cardiovascular Pedal Pulses WNL. No clubbing, cyanosis or edema. Musculoskeletal Adexa without tenderness or enlargement.. Digits and nails w/o clubbing, cyanosis, infection, petechiae, ischemia, or inflammatory  conditions.Marland Kitchen Psychiatric Judgement and insight Intact.. No evidence of depression, anxiety, or agitation.. Integumentary (Hair, Skin) she has a new superficial wound on the medial malleolus area and the one on the lateral malleolus continues to have some depth to it and needs some debridement with saline gauze.Marland Kitchen No crepitus or fluctuance. No peri-wound warmth or erythema. No masses.. Wound #1 status is Open. Original cause of wound was Not Known. The wound is located on the Right Malleolus. The wound measures 1.5cm length x 2.1cm width x 0.3cm depth; 2.474cm^2 area and 0.742cm^3 volume. The wound is limited to skin breakdown. There is no tunneling or undermining noted. There is a medium amount of serosanguineous drainage noted. The wound margin is distinct with the outline attached to the wound base. There is medium (34-66%) red granulation within the wound bed. There is a medium (34-66%) amount of necrotic tissue within the wound bed including Adherent Slough. The periwound skin appearance exhibited: Localized Edema, Maceration, Moist, Hemosiderin Staining. The periwound skin appearance did not exhibit: Callus, Crepitus, Excoriation, Fluctuance, Friable, Induration, Rash, Scarring, Dry/Scaly, Atrophie Blanche, Cyanosis, Ecchymosis, Mottled, Pallor, Rubor,  Erythema. [name], [name] ([id number]) Periwound temperature was noted as No Abnormality. The periwound has tenderness on palpation. Wound #2 status is Open. Original cause of wound was Not Known. The wound is located on the Right,Medial Lower Leg. The wound measures 0.3cm length x 0.3cm width x 0.1cm depth; 0.071cm^2 area and 0.007cm^3 volume. The wound is limited to skin breakdown. There is no tunneling or undermining noted. There is a small amount of serosanguineous drainage noted. The wound margin is distinct with the outline attached to the wound base. There is medium (34-66%) red granulation within the wound bed. There is a medium (34-66%) amount of necrotic tissue within the wound bed including Adherent Slough. The periwound skin appearance exhibited: Localized Edema, Dry/Scaly, Moist, Hemosiderin Staining. The periwound skin appearance did not exhibit: Callus, Crepitus, Excoriation, Fluctuance, Friable, Induration, Rash, Scarring, Maceration, Atrophie Blanche, Cyanosis, Ecchymosis, Mottled, Pallor, Rubor, Erythema. Periwound temperature was noted as No Abnormality. The periwound has tenderness on palpation. 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 Also understands the need for elevation of her limb and I have urged her to keep her appointment with the vascular surgeon. She will come back and see me next week. Plan Wound Cleansing: Wound #1 Right Malleolus: Cleanse wound with mild soap and water Wound #2 Right,Medial Lower Leg: Cleanse wound with mild soap and water Anesthetic: Wound #1 Right Malleolus: Topical Lidocaine 4% cream applied to wound bed prior to debridement Wound #2 Right,Medial  Lower Leg: Topical Lidocaine 4% cream applied to wound bed prior to debridement Primary Wound Dressing: Wound #1 Right Malleolus: [name], [name] ([id number]) Aquacel Ag Wound #2 Right,Medial Lower Leg: Aquacel Ag Secondary Dressing: Wound #1 Right Malleolus: ABD pad Wound #2 Right,Medial Lower Leg: ABD pad Dressing Change Frequency: Wound #1 Right Malleolus: Change dressing every week Wound #2 Right,Medial Lower Leg: Change dressing every week Follow-up Appointments: Wound #1 Right Malleolus: Return Appointment in 1 week. Wound #2 Right,Medial Lower Leg: Return Appointment in 1 week. Edema Control: Wound #1 Right Malleolus: Unna Boot to Right Lower Extremity Wound #2 Right,Medial Lower Leg: Unna Boot to Right Lower Extremity Additional Orders /  Instructions: Wound #1 Right Malleolus: Other: - continue working on weight management Wound #2 Right,Medial Lower Leg: Other: - continue working on weight management We will continue local dressing with silver alginate and appropriate application of Unna's boot. Also understands the need for elevation of her limb and I have urged her to keep her appointment with the vascular surgeon. She will come back and see me next week. Electronic Signature(s) Signed: 09/27/2014 4:43:13 PM By: Evlyn Kanner MD, FACS Previous Signature: 09/27/2014 12:20:11 PM Version By: Evlyn Kanner MD, FACS Entered By: Evlyn Kanner on 09/27/2014 16:42:43 Ozimek, [name] [name] ([id number]) -------------------------------------------------------------------------------- SuperBill Details Patient Name: [name], [name]. Date of Service: 09/27/2014 Medical Record Number: [id number] Patient Account Number: [id number] Date of Birth/Sex: April 10, 1986 (29 y.o. 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 E66.01 Morbid (severe) obesity due to excess calories Facility Procedures CPT4: Description Modifier Quantity Code [id number] (Facility Use Only) [phone] - APPLY MULTLAY COMPRS LWR RT 1 LEG Physician Procedures CPT4 Code Description: [id number] - WC PHYS LEVEL 3 - EST PT ICD-10 Description Diagnosis I83.013 Varicose veins of right lower extremity with ulcer L97.312 Non-pressure chronic ulcer of right ankle with fat E66.01 Morbid (severe) obesity due to  excess calories Modifier: of ankle layer expose Quantity: 1 d Electronic Signature(s) Signed: 09/27/2014 9:45:17 AM By: Curtis Sites Signed: 09/27/2014 12:20:11 PM By: Evlyn Kanner MD, FACS Entered By: Curtis Sites on 09/27/2014 09:45:17

## 2014-09-27 NOTE — Progress Notes (Signed)
[name], [name] ([id number]) Visit Report for 09/27/2014 Arrival Information Details Patient Name: [name], [name]. Date of Service: 09/27/2014 8:15 AM Medical Record Number: [id number] Patient Account Number: [id number] Date of Birth/Sex: [birth year] (28 y.o. Female) Treating RN: Renee Harder Primary Care Physician: Felix Pacini Other Clinician: Referring Physician: Felix Pacini Treating Physician/Extender: Rudene Re in Treatment: 1 Visit Information History Since Last Visit Added or deleted any medications: Yes Patient Arrived: Ambulatory Any new allergies or adverse reactions: No Arrival Time: 08:52 Had a fall or experienced change in No Accompanied By: self activities of daily living that may affect Transfer Assistance: None risk of falls: Patient Identification Verified: Yes Signs or symptoms of abuse/neglect since last No Secondary Verification Process Yes visito Completed: Hospitalized since last visit: No Patient Has Alerts: Yes Has Dressing in Place as Prescribed: Yes Patient Alerts: 09/2013 ABI R: Has Compression in Place as Prescribed: Yes 1.3 Pain Present Now: Yes Electronic Signature(s) Signed: 09/27/2014 5:11:45 PM By: Renee Harder RN Entered By: Renee Harder on 09/27/2014 08:54:25 [name] ([id number]) -------------------------------------------------------------------------------- Encounter Discharge Information Details Patient Name: [name], [name]. Date of Service: 09/27/2014 8:15 AM Medical Record Number: [id number] Patient Account Number: [id number] Date of Birth/Sex: 01/10/1986 (28 y.o. Female) Treating RN: Primary Care Physician: KUNEFF, RENEE Other Clinician: Referring Physician: KUNEFF, RENEE Treating Physician/Extender: Rudene Re in Treatment: 1 Encounter Discharge Information Items Discharge Condition: Stable Ambulatory Status: Ambulatory Discharge Destination: Home Transportation: Private Auto Accompanied By:  self Schedule Follow-up Appointment: Yes Medication Reconciliation completed and provided to Patient/Care No Emmert Roethler: Provided on Clinical Summary of Care: 09/27/2014 Form Type Recipient Paper Patient SS Electronic Signature(s) Signed: 09/27/2014 5:11:45 PM By: Renee Harder RN Previous Signature: 09/27/2014 9:35:46 AM Version By: Gwenlyn Perking Entered By: Renee Harder on 09/27/2014 16:55:11 [name] ([id number]) -------------------------------------------------------------------------------- Lower Extremity Assessment Details Patient Name: [name], [name] Date of Service: 09/27/2014 8:15 AM Medical Record Number: [id number] Patient Account Number: [id number] Date of Birth/Sex: 05/26/1985 (28 y.o. Female) Treating RN: Renee Harder Primary Care Physician: Felix Pacini Other Clinician: Referring Physician: Felix Pacini Treating Physician/Extender: Rudene Re in Treatment: 1 Edema Assessment Assessed: [Left: No] [Right: Yes] Edema: [Left: Ye] [Right: s] Calf Left: Right: Point of Measurement: 34 cm From Medial Instep cm 57 cm Ankle Left: Right: Point of Measurement: 10 cm From Medial Instep cm 29.5 cm Vascular Assessment Claudication: Claudication Assessment [Right:None] Pulses: Posterior Tibial Palpable: [Right:Yes] Dorsalis Pedis Palpable: [Right:Yes] Extremity colors, hair growth, and conditions: Extremity Color: [Right:Hyperpigmented] Hair Growth on Extremity: [Right:No] Temperature of Extremity: [Right:Warm] Capillary Refill: [Right:< 3 seconds] Dependent Rubor: [Right:No] Blanched when Elevated: [Right:No] Toe Nail Assessment Left: Right: Thick: No Discolored: No Deformed: No Improper Length and Hygiene: No [name] ([id number]) Electronic Signature(s) Signed: 09/27/2014 5:11:45 PM By: Renee Harder RN Entered By: Renee Harder on 09/27/2014 09:01:40 [name]  ([id number]) -------------------------------------------------------------------------------- Multi Wound Chart Details Patient Name: [name], [name] Date of Service: 09/27/2014 8:15 AM Medical Record Number: [id number] Patient Account Number: [id number] Date of Birth/Sex: 29-Apr-1985 (28 y.o. Female) Treating RN: Curtis Sites Primary Care Physician: Felix Pacini Other Clinician: Referring Physician: KUNEFF, RENEE Treating Physician/Extender: Rudene Re in Treatment: 1 Vital Signs Height(in): 66 Pulse(bpm): 84 Weight(lbs): 350.3 Blood Pressure 124/75 (mmHg): Body Mass Index(BMI): 57 Temperature(F): 98.4 Respiratory Rate 12 (breaths/min): Photos: [1:No Photos] [2:No Photos] [N/A:N/A] Wound Location: [1:Right Malleolus] [2:Right Lower Leg - Medial] [N/A:N/A] Wounding Event: [1:Not Known] [2:Not Known] [N/A:N/A] Primary Etiology: [1:Venous Leg Ulcer] [2:Lymphedema] [N/A:N/A] Date Acquired: [1:09/06/2014] [2:09/27/2014] [  N/A:N/A] Weeks of Treatment: [1:1] [2:0] [N/A:N/A] Wound Status: [1:Open] [2:Open] [N/A:N/A] Measurements L x W x D 1.5x2.1x0.3 [2:0.3x0.3x0.1] [N/A:N/A] (cm) Area (cm) : [1:2.474] [2:0.071] [N/A:N/A] Volume (cm) : [1:0.742] [2:0.007] [N/A:N/A] % Reduction in Area: [1:-12.50%] [2:0.00%] [N/A:N/A] % Reduction in Volume: -12.40% [2:0.00%] [N/A:N/A] Classification: [1:Full Thickness Without Exposed Support Structures] [2:Full Thickness Without Exposed Support Structures] [N/A:N/A] Exudate Amount: [1:Medium] [2:Small] [N/A:N/A] Exudate Type: [1:Serosanguineous] [2:Serosanguineous] [N/A:N/A] Exudate Color: [1:red, brown] [2:red, brown] [N/A:N/A] Wound Margin: [1:Distinct, outline attached] [2:Distinct, outline attached] [N/A:N/A] Granulation Amount: [1:Medium (34-66%)] [2:Medium (34-66%)] [N/A:N/A] Granulation Quality: [1:Red] [2:Red] [N/A:N/A] Necrotic Amount: [1:Medium (34-66%)] [2:Medium (34-66%)] [N/A:N/A] Exposed Structures: [1:Fascia: No Fat:  No Tendon: No Muscle: No Joint: No Bone: No] [2:Fascia: No Fat: No Tendon: No Muscle: No Joint: No Bone: No] [N/A:N/A] Limited to Skin Limited to Skin Breakdown Breakdown Epithelialization: None Small (1-33%) N/A Periwound Skin Texture: Edema: Yes Edema: Yes N/A Excoriation: No Excoriation: No Induration: No Induration: No Callus: No Callus: No Crepitus: No Crepitus: No Fluctuance: No Fluctuance: No Friable: No Friable: No Rash: No Rash: No Scarring: No Scarring: No Periwound Skin Maceration: Yes Moist: Yes N/A Moisture: Moist: Yes Dry/Scaly: Yes Dry/Scaly: No Maceration: No Periwound Skin Color: Hemosiderin Staining: Yes Hemosiderin Staining: Yes N/A Atrophie Blanche: No Atrophie Blanche: No Cyanosis: No Cyanosis: No Ecchymosis: No Ecchymosis: No Erythema: No Erythema: No Mottled: No Mottled: No Pallor: No Pallor: No Rubor: No Rubor: No Temperature: No Abnormality No Abnormality N/A Tenderness on Yes Yes N/A Palpation: Wound Preparation: Ulcer Cleansing: Ulcer Cleansing: N/A Rinsed/Irrigated with Rinsed/Irrigated with Saline Saline Topical Anesthetic Topical Anesthetic Applied: Other: Lidocaine Applied: Other: Lidocaine 4% Ointment 4% Ointment Treatment Notes Wound #1 (Right Malleolus) 1. Cleansed with: Clean wound with Normal Saline 2. Anesthetic Topical Lidocaine 4% cream to wound bed prior to debridement 4. Dressing Applied: Aquacel Ag 7. Secured with [employer] to Right Lower Extremity Wound #2 (Right, Medial Lower Leg) 1. Cleansed with: Clean wound with Normal Saline 2. Anesthetic STACYANN, MCCONAUGHY ([id number]) Topical Lidocaine 4% cream to wound bed prior to debridement 4. Dressing Applied: Aquacel Ag 7. Secured with Henriette [employer] to Right Lower Extremity Electronic Signature(s) Signed: 09/27/2014 9:43:37 AM By: Curtis Sites Entered By: Curtis Sites on 09/27/2014 09:43:37 Heumann, [name] [name]  ([id number]) -------------------------------------------------------------------------------- Multi-Disciplinary Care Plan Details Patient Name: [name], [name]. Date of Service: 09/27/2014 8:15 AM Medical Record Number: [id number] Patient Account Number: [id number] Date of Birth/Sex: 01-02-1986 (28 y.o. Female) Treating RN: Curtis Sites Primary Care Physician: Felix Pacini Other Clinician: Referring Physician: Felix Pacini Treating Physician/Extender: Rudene Re in Treatment: 1 Active Inactive Abuse / Safety / Falls / Self Care Management Nursing Diagnoses: Potential for falls Goals: Patient will remain injury free Date Initiated: 09/20/2014 Goal Status: Active Interventions: Assess fall risk on admission and as needed Notes: Orientation to the Wound Care Program Nursing Diagnoses: Knowledge deficit related to the wound healing center program Goals: Patient/caregiver will verbalize understanding of the Wound Healing Center Program Date Initiated: 09/20/2014 Goal Status: Active Interventions: Provide education on orientation to the wound center Notes: Pain, Acute or Chronic Nursing Diagnoses: Pain Management - Cyclic Acute (Dressing Change Related) Goals: Patient will verbalize adequate pain control and receive pain control interventions during procedures as needed LOVA, URBIETA ([id number]) Date Initiated: 09/20/2014 Goal Status: Active Interventions: Complete pain assessment as per visit requirements Notes: Venous Leg Ulcer Nursing Diagnoses: Potential for venous Insuffiency (use before diagnosis confirmed) Goals: Non-invasive venous studies are completed as ordered Date Initiated: 09/20/2014 Goal Status: Active  Interventions: Assess peripheral edema status every visit. Notes: Wound/Skin Impairment Nursing Diagnoses: Impaired tissue integrity Goals: Ulcer/skin breakdown will have a volume reduction of 30% by week 4 Date Initiated: 09/20/2014 Goal  Status: Active Interventions: Assess ulceration(s) every visit Notes: Electronic Signature(s) Signed: 09/27/2014 9:43:28 AM By: Curtis Sites Entered By: Curtis Sites on 09/27/2014 09:43:27 Heitz, [name] [name] ([id number]) -------------------------------------------------------------------------------- Pain Assessment Details Patient Name: [name], [name]. Date of Service: 09/27/2014 8:15 AM Medical Record Number: [id number] Patient Account Number: [id number] Date of Birth/Sex: April 27, 1985 (28 y.o. Female) Treating RN: Renee Harder Primary Care Physician: Felix Pacini Other Clinician: Referring Physician: Felix Pacini Treating Physician/Extender: Rudene Re in Treatment: 1 Active Problems Location of Pain Severity and Description of Pain Patient Has Paino Yes Site Locations Pain Location: Pain in Ulcers With Dressing Change: Yes Duration of the Pain. Constant / Intermittento Intermittent Rate the pain. Current Pain Level: 0 Worst Pain Level: 8 Least Pain Level: 0 Character of Pain Describe the Pain: Other: "pulling" Pain Management and Medication Current Pain Management: Electronic Signature(s) Signed: 09/27/2014 5:11:45 PM By: Renee Harder RN Entered By: Renee Harder on 09/27/2014 08:54:55 Horrigan, [name] [name] ([id number]) -------------------------------------------------------------------------------- Patient/Caregiver Education Details Patient Name: [name], [name] Date of Service: 09/27/2014 8:15 AM Medical Record Number: [id number] Patient Account Number: [id number] Date of Birth/Gender: [birth year] (28 y.o. Female) Treating RN: Renee Harder Primary Care Physician: Felix Pacini Other Clinician: Referring Physician: Felix Pacini Treating Physician/Extender: Rudene Re in Treatment: 1 Education Assessment Education Provided To: Patient Education Topics Provided Venous: Methods: Explain/Verbal Responses: State content correctly Wound  Debridement: Methods: Explain/Verbal Responses: State content correctly Wound/Skin Impairment: Methods: Explain/Verbal Responses: State content correctly Electronic Signature(s) Signed: 09/27/2014 5:11:45 PM By: Renee Harder RN Entered By: Renee Harder on 09/27/2014 09:43:12 Folse, [name] [name] ([id number]) -------------------------------------------------------------------------------- Wound Assessment Details Patient Name: [name], [name]. Date of Service: 09/27/2014 8:15 AM Medical Record Number: [id number] Patient Account Number: [id number] Date of Birth/Sex: 19-Aug-1985 (28 y.o. Female) Treating RN: Renee Harder Primary Care Physician: Felix Pacini Other Clinician: Referring Physician: Claiborne Billings, RENEE Treating Physician/Extender: Rudene Re in Treatment: 1 Wound Status Wound Number: 1 Primary Etiology: Venous Leg Ulcer Wound Location: Right Malleolus Wound Status: Open Wounding Event: Not Known Date Acquired: 09/06/2014 Weeks Of Treatment: 1 Clustered Wound: No Photos Photo Uploaded By: Renee Harder on 09/27/2014 12:26:23 Wound Measurements Length: (cm) 1.5 Width: (cm) 2.1 Depth: (cm) 0.3 Area: (cm) 2.474 Volume: (cm) 0.742 % Reduction in Area: -12.5% % Reduction in Volume: -12.4% Epithelialization: None Tunneling: No Undermining: No Wound Description Full Thickness Without Exposed Classification: Support Structures Wound Margin: Distinct, outline attached Exudate Medium Amount: BRINLEE, GAMBRELL ([id number]) Foul Odor After Cleansing: No Exudate Type: Serosanguineous Exudate Color: red, brown Wound Bed Granulation Amount: Medium (34-66%) Exposed Structure Granulation Quality: Red Fascia Exposed: No Necrotic Amount: Medium (34-66%) Fat Layer Exposed: No Necrotic Quality: Adherent Slough Tendon Exposed: No Muscle Exposed: No Joint Exposed: No Bone Exposed: No Limited to Skin Breakdown Periwound Skin Texture Texture Color No  Abnormalities Noted: No No Abnormalities Noted: No Callus: No Atrophie Blanche: No Crepitus: No Cyanosis: No Excoriation: No Ecchymosis: No Fluctuance: No Erythema: No Friable: No Hemosiderin Staining: Yes Induration: No Mottled: No Localized Edema: Yes Pallor: No Rash: No Rubor: No Scarring: No Temperature / Pain Moisture Temperature: No Abnormality No Abnormalities Noted: No Tenderness on Palpation: Yes Dry / Scaly: No Maceration: Yes Moist: Yes Wound Preparation Ulcer Cleansing: Rinsed/Irrigated with Saline Topical Anesthetic Applied: Other: Lidocaine 4% Ointment , Treatment Notes Wound #1 (Right  Malleolus) 1. Cleansed with: Clean wound with Normal Saline 2. Anesthetic Topical Lidocaine 4% cream to wound bed prior to debridement 4. Dressing Applied: Aquacel Ag 7. Secured with [employer] to Right Lower Extremity Electronic Signature(s) ZAYLYNN, RICKETT ([id number]) Signed: 09/27/2014 5:11:45 PM By: Renee Harder RN Entered By: Renee Harder on 09/27/2014 09:41:25 Saur, [name] [name] ([id number]) -------------------------------------------------------------------------------- Wound Assessment Details Patient Name: [name], [name]. Date of Service: 09/27/2014 8:15 AM Medical Record Number: [id number] Patient Account Number: [id number] Date of Birth/Sex: 17-Feb-1986 (28 y.o. Female) Treating RN: Renee Harder Primary Care Physician: Felix Pacini Other Clinician: Referring Physician: Claiborne Billings, RENEE Treating Physician/Extender: Rudene Re in Treatment: 1 Wound Status Wound Number: 2 Primary Etiology: Lymphedema Wound Location: Right Lower Leg - Medial Wound Status: Open Wounding Event: Not Known Date Acquired: 09/27/2014 Weeks Of Treatment: 0 Clustered Wound: No Photos Photo Uploaded By: Renee Harder on 09/27/2014 12:26:23 Wound Measurements Length: (cm) 0.3 Width: (cm) 0.3 Depth: (cm) 0.1 Area: (cm) 0.071 Volume: (cm) 0.007 % Reduction in  Area: 0% % Reduction in Volume: 0% Epithelialization: Small (1-33%) Tunneling: No Undermining: No Wound Description Full Thickness Without Exposed Classification: Support Structures Wound Margin: Distinct, outline attached Exudate Small Amount: JACCI, RUBERG ([id number]) Foul Odor After Cleansing: No Exudate Type: Serosanguineous Exudate Color: red, brown Wound Bed Granulation Amount: Medium (34-66%) Exposed Structure Granulation Quality: Red Fascia Exposed: No Necrotic Amount: Medium (34-66%) Fat Layer Exposed: No Necrotic Quality: Adherent Slough Tendon Exposed: No Muscle Exposed: No Joint Exposed: No Bone Exposed: No Limited to Skin Breakdown Periwound Skin Texture Texture Color No Abnormalities Noted: No No Abnormalities Noted: No Callus: No Atrophie Blanche: No Crepitus: No Cyanosis: No Excoriation: No Ecchymosis: No Fluctuance: No Erythema: No Friable: No Hemosiderin Staining: Yes Induration: No Mottled: No Localized Edema: Yes Pallor: No Rash: No Rubor: No Scarring: No Temperature / Pain Moisture Temperature: No Abnormality No Abnormalities Noted: No Tenderness on Palpation: Yes Dry / Scaly: Yes Maceration: No Moist: Yes Wound Preparation Ulcer Cleansing: Rinsed/Irrigated with Saline Topical Anesthetic Applied: Other: Lidocaine 4% Ointment, Treatment Notes Wound #2 (Right, Medial Lower Leg) 1. Cleansed with: Clean wound with Normal Saline 2. Anesthetic Topical Lidocaine 4% cream to wound bed prior to debridement 4. Dressing Applied: Aquacel Ag 7. Secured with [employer] to Right Lower Extremity Electronic Signature(s) BEAUX, VERNE ([id number]) Signed: 09/27/2014 5:11:45 PM By: Renee Harder RN Entered By: Renee Harder on 09/27/2014 09:42:30 Pilat, [name] [name] ([id number]) -------------------------------------------------------------------------------- Vitals Details Patient Name: [name], [name] Date of Service: 09/27/2014 8:15  AM Medical Record Number: [id number] Patient Account Number: [id number] Date of Birth/Sex: 1985-07-11 (28 y.o. Female) Treating RN: Renee Harder Primary Care Physician: Felix Pacini Other Clinician: Referring Physician: Felix Pacini Treating Physician/Extender: Rudene Re in Treatment: 1 Vital Signs Time Taken: 08:55 Temperature (F): 98.4 Height (in): 66 Pulse (bpm): 84 Weight (lbs): 350.3 Respiratory Rate (breaths/min): 12 Body Mass Index (BMI): 56.5 Blood Pressure (mmHg): 124/75 Reference Range: 80 - 120 mg / dl Electronic Signature(s) Signed: 09/27/2014 5:11:45 PM By: Renee Harder RN Entered By: Renee Harder on 09/27/2014 08:55:16

## 2014-10-05 ENCOUNTER — Encounter: Payer: BLUE CROSS/BLUE SHIELD | Admitting: Surgery

## 2014-10-05 DIAGNOSIS — L97312 Non-pressure chronic ulcer of right ankle with fat layer exposed: Secondary | ICD-10-CM | POA: Diagnosis not present

## 2014-10-06 NOTE — Progress Notes (Signed)
EZRI, FASULO ([id number]) Visit Report for 10/05/2014 Arrival Information Details Patient Name: [name], [name]. Date of Service: 10/05/2014 3:30 PM Medical Record Number: [id number] Patient Account Number: [id number] Date of Birth/Sex: 1985/06/13 (28 y.o. Female) Treating RN: Renee Harder Primary Care Physician: Felix Pacini Other Clinician: Referring Physician: Felix Pacini Treating Physician/Extender: Rudene Re in Treatment: 2 Visit Information History Since Last Visit Added or deleted any medications: No Patient Arrived: Ambulatory Any new allergies or adverse reactions: No Arrival Time: 15:54 Had a fall or experienced change in No Accompanied By: self activities of daily living that may affect Transfer Assistance: None risk of falls: Patient Identification Verified: Yes Signs or symptoms of abuse/neglect since last No Secondary Verification Process Yes visito Completed: Hospitalized since last visit: No Patient Has Alerts: Yes Has Dressing in Place as Prescribed: Yes Patient Alerts: 09/2013 ABI R: Has Compression in Place as Prescribed: Yes 1.3 Pain Present Now: Yes Electronic Signature(s) Signed: 10/05/2014 5:19:44 PM By: Renee Harder RN Entered By: Renee Harder on 10/05/2014 15:54:38 Fasnacht, [name] [name] ([id number]) -------------------------------------------------------------------------------- Encounter Discharge Information Details Patient Name: [name]. Date of Service: 10/05/2014 3:30 PM Medical Record Number: [id number] Patient Account Number: [id number] Date of Birth/Sex: Dec 23, 1985 (28 y.o. Female) Treating RN: Primary Care Physician: KUNEFF, RENEE Other Clinician: Referring Physician: KUNEFF, RENEE Treating Physician/Extender: Rudene Re in Treatment: 2 Encounter Discharge Information Items Discharge Pain Level: 0 Discharge Condition: Stable Ambulatory Status: Ambulatory Discharge Destination: Home Transportation:  Private Auto Accompanied By: self Schedule Follow-up Appointment: Yes Medication Reconciliation completed and provided to Patient/Care No Joshwa Hemric: Provided on Clinical Summary of Care: 10/05/2014 Form Type Recipient Paper Patient SS Electronic Signature(s) Signed: 10/05/2014 5:15:36 PM By: Curtis Sites Previous Signature: 10/05/2014 4:25:57 PM Version By: Gwenlyn Perking Entered By: Curtis Sites on 10/05/2014 17:15:36 Haymer, [name] [name] ([id number]) -------------------------------------------------------------------------------- Lower Extremity Assessment Details Patient Name: [name] Date of Service: 10/05/2014 3:30 PM Medical Record Number: [id number] Patient Account Number: [id number] Date of Birth/Sex: Sep 26, 1985 (28 y.o. Female) Treating RN: Renee Harder Primary Care Physician: Felix Pacini Other Clinician: Referring Physician: Felix Pacini Treating Physician/Extender: Rudene Re in Treatment: 2 Edema Assessment Assessed: [Left: No] [Right: Yes] Edema: [Left: Ye] [Right: s] Calf Left: Right: Point of Measurement: 34 cm From Medial Instep cm 57.9 cm Ankle Left: Right: Point of Measurement: 10 cm From Medial Instep cm 29.7 cm Vascular Assessment Claudication: Claudication Assessment [Right:None] Pulses: Posterior Tibial Palpable: [Right:Yes] Dorsalis Pedis Palpable: [Right:Yes] Extremity colors, hair growth, and conditions: Extremity Color: [Right:Hyperpigmented] Hair Growth on Extremity: [Right:No] Temperature of Extremity: [Right:Warm] Capillary Refill: [Right:< 3 seconds] Dependent Rubor: [Right:No] Blanched when Elevated: [Right:No] Toe Nail Assessment Left: Right: Thick: No Discolored: No Deformed: No Improper Length and Hygiene: No [name], [name] ([id number]) Electronic Signature(s) Signed: 10/05/2014 5:19:44 PM By: Renee Harder RN Entered By: Renee Harder on 10/05/2014 15:58:49 [name]  ([id number]) -------------------------------------------------------------------------------- Multi Wound Chart Details Patient Name: [name] Date of Service: 10/05/2014 3:30 PM Medical Record Number: [id number] Patient Account Number: [id number] Date of Birth/Sex: 1986/02/07 (28 y.o. Female) Treating RN: Renee Harder Primary Care Physician: Felix Pacini Other Clinician: Referring Physician: Felix Pacini Treating Physician/Extender: Rudene Re in Treatment: 2 Vital Signs Height(in): 66 Pulse(bpm): 92 Weight(lbs): 350.3 Blood Pressure 117/74 (mmHg): Body Mass Index(BMI): 57 Temperature(F): 97.9 Respiratory Rate 16 (breaths/min): Photos: [1:No Photos] [2:No Photos] [N/A:N/A] Wound Location: [1:Right Malleolus - Lateral] [2:Right Lower Leg - Medial] [N/A:N/A] Wounding Event: [1:Not Known] [2:Not Known] [N/A:N/A] Primary Etiology: [1:Venous Leg Ulcer] [2:Lymphedema] [  N/A:N/A] Date Acquired: [1:09/06/2014] [2:09/27/2014] [N/A:N/A] Weeks of Treatment: [1:2] [2:1] [N/A:N/A] Wound Status: [1:Open] [2:Open] [N/A:N/A] Measurements L x W x D 1.3x2x0.2 [2:0.3x0.3x0.1] [N/A:N/A] (cm) Area (cm) : [1:2.042] [2:0.071] [N/A:N/A] Volume (cm) : [1:0.408] [2:0.007] [N/A:N/A] % Reduction in Area: [1:7.10%] [2:0.00%] [N/A:N/A] % Reduction in Volume: 38.20% [2:0.00%] [N/A:N/A] Classification: [1:Full Thickness Without Exposed Support Structures] [2:Full Thickness Without Exposed Support Structures] [N/A:N/A] Exudate Amount: [1:Medium] [2:Small] [N/A:N/A] Exudate Type: [1:Serosanguineous] [2:Serosanguineous] [N/A:N/A] Exudate Color: [1:red, brown] [2:red, brown] [N/A:N/A] Wound Margin: [1:Distinct, outline attached] [2:Distinct, outline attached] [N/A:N/A] Granulation Amount: [1:Large (67-100%)] [2:None Present (0%)] [N/A:N/A] Granulation Quality: [1:Red] [2:N/A] [N/A:N/A] Necrotic Amount: [1:Small (1-33%)] [2:Large (67-100%)] [N/A:N/A] Necrotic Tissue: [1:Adherent Slough]  [2:Eschar] [N/A:N/A] Exposed Structures: [1:Fascia: No Fat: No Tendon: No Muscle: No Joint: No Bone: No] [2:Fascia: No Fat: No Tendon: No Muscle: No Joint: No Bone: No] [N/A:N/A] Limited to Skin Limited to Skin Breakdown Breakdown Epithelialization: None Small (1-33%) N/A Periwound Skin Texture: Edema: Yes Edema: Yes N/A Excoriation: No Excoriation: No Induration: No Induration: No Callus: No Callus: No Crepitus: No Crepitus: No Fluctuance: No Fluctuance: No Friable: No Friable: No Rash: No Rash: No Scarring: No Scarring: No Periwound Skin Moist: Yes Moist: Yes N/A Moisture: Maceration: No Dry/Scaly: Yes Dry/Scaly: No Maceration: No Periwound Skin Color: Hemosiderin Staining: Yes Hemosiderin Staining: Yes N/A Atrophie Blanche: No Atrophie Blanche: No Cyanosis: No Cyanosis: No Ecchymosis: No Ecchymosis: No Erythema: No Erythema: No Mottled: No Mottled: No Pallor: No Pallor: No Rubor: No Rubor: No Temperature: No Abnormality No Abnormality N/A Tenderness on Yes Yes N/A Palpation: Wound Preparation: Ulcer Cleansing: Ulcer Cleansing: N/A Rinsed/Irrigated with Rinsed/Irrigated with Saline Saline Topical Anesthetic Topical Anesthetic Applied: Other: Lidocaine Applied: Other: Lidocaine 4% Ointment 4% Ointment Treatment Notes Electronic Signature(s) Signed: 10/05/2014 5:19:44 PM By: Renee Harder RN Entered By: Renee Harder on 10/05/2014 16:03:54 Speltz, [name] [name] ([id number]) -------------------------------------------------------------------------------- Multi-Disciplinary Care Plan Details Patient Name: KALKIDAN, CAUDELL. Date of Service: 10/05/2014 3:30 PM Medical Record Number: [id number] Patient Account Number: [id number] Date of Birth/Sex: 31-Mar-1986 (28 y.o. Female) Treating RN: Renee Harder Primary Care Physician: Felix Pacini Other Clinician: Referring Physician: Felix Pacini Treating Physician/Extender: Rudene Re in Treatment:  2 Active Inactive Abuse / Safety / Falls / Self Care Management Nursing Diagnoses: Potential for falls Goals: Patient will remain injury free Date Initiated: 09/20/2014 Goal Status: Active Interventions: Assess fall risk on admission and as needed Notes: Orientation to the Wound Care Program Nursing Diagnoses: Knowledge deficit related to the wound healing center program Goals: Patient/caregiver will verbalize understanding of the Wound Healing Center Program Date Initiated: 09/20/2014 Goal Status: Active Interventions: Provide education on orientation to the wound center Notes: Pain, Acute or Chronic Nursing Diagnoses: Pain Management - Cyclic Acute (Dressing Change Related) Goals: Patient will verbalize adequate pain control and receive pain control interventions during procedures as needed GENAVIEVE, MANGIAPANE ([id number]) Date Initiated: 09/20/2014 Goal Status: Active Interventions: Complete pain assessment as per visit requirements Notes: Venous Leg Ulcer Nursing Diagnoses: Potential for venous Insuffiency (use before diagnosis confirmed) Goals: Non-invasive venous studies are completed as ordered Date Initiated: 09/20/2014 Goal Status: Active Interventions: Assess peripheral edema status every visit. Notes: Wound/Skin Impairment Nursing Diagnoses: Impaired tissue integrity Goals: Ulcer/skin breakdown will have a volume reduction of 30% by week 4 Date Initiated: 09/20/2014 Goal Status: Active Interventions: Assess ulceration(s) every visit Notes: Electronic Signature(s) Signed: 10/05/2014 5:19:44 PM By: Renee Harder RN Entered By: Renee Harder on 10/05/2014 16:03:43 Salasar, [name] [name] ([id number]) -------------------------------------------------------------------------------- Pain Assessment Details Patient Name: Virgia Land  J. Date of Service: 10/05/2014 3:30 PM Medical Record Number: [id number] Patient Account Number: [id number] Date of Birth/Sex: 01-08-1986  (28 y.o. Female) Treating RN: Renee Harder Primary Care Physician: Felix Pacini Other Clinician: Referring Physician: Felix Pacini Treating Physician/Extender: Rudene Re in Treatment: 2 Active Problems Location of Pain Severity and Description of Pain Patient Has Paino Yes Site Locations Pain Location: Pain in Ulcers With Dressing Change: Yes Duration of the Pain. Constant / Intermittento Constant Rate the pain. Current Pain Level: 6 Worst Pain Level: 10 Least Pain Level: 3 Character of Pain Describe the Pain: Other: "pulling" Pain Management and Medication Current Pain Management: Electronic Signature(s) Signed: 10/05/2014 5:19:44 PM By: Renee Harder RN Entered By: Renee Harder on 10/05/2014 15:55:25 Wixted, [name] [name] ([id number]) -------------------------------------------------------------------------------- Patient/Caregiver Education Details Patient Name: SHEMICA, MEATH Date of Service: 10/05/2014 3:30 PM Medical Record Number: [id number] Patient Account Number: [id number] Date of Birth/Gender: 02-Feb-1986 (28 y.o. Female) Treating RN: Curtis Sites Primary Care Physician: Felix Pacini Other Clinician: Referring Physician: Felix Pacini Treating Physician/Extender: Rudene Re in Treatment: 2 Education Assessment Education Provided To: Patient Education Topics Provided Wound/Skin Impairment: Handouts: Other: reutnr if needed for rewrap Methods: Explain/Verbal Responses: State content correctly Electronic Signature(s) Signed: 10/05/2014 5:15:53 PM By: Curtis Sites Entered By: Curtis Sites on 10/05/2014 17:15:53 Whitworth, [name] [name] ([id number]) -------------------------------------------------------------------------------- Wound Assessment Details Patient Name: MILEYDI, MILSAP. Date of Service: 10/05/2014 3:30 PM Medical Record Number: [id number] Patient Account Number: [id number] Date of Birth/Sex: 1985-12-03 (28 y.o.  Female) Treating RN: Renee Harder Primary Care Physician: Felix Pacini Other Clinician: Referring Physician: Claiborne Billings, RENEE Treating Physician/Extender: Rudene Re in Treatment: 2 Wound Status Wound Number: 1 Primary Etiology: Venous Leg Ulcer Wound Location: Right Malleolus - Lateral Wound Status: Open Wounding Event: Not Known Date Acquired: 09/06/2014 Weeks Of Treatment: 2 Clustered Wound: No Photos Photo Uploaded By: Curtis Sites on 10/05/2014 17:20:01 Wound Measurements Length: (cm) 1.3 Width: (cm) 2 Depth: (cm) 0.2 Area: (cm) 2.042 Volume: (cm) 0.408 % Reduction in Area: 7.1% % Reduction in Volume: 38.2% Epithelialization: None Tunneling: No Undermining: No Wound Description Full Thickness Without Exposed Classification: Support Structures Wound Margin: Distinct, outline attached Exudate Medium Amount: Exudate Type: Serosanguineous Exudate Color: red, brown Foul Odor After Cleansing: No Wound Bed Granulation Amount: Large (67-100%) Exposed Structure Granulation Quality: Red Fascia Exposed: No Necrotic Amount: Small (1-33%) Fat Layer Exposed: No FAVIOLA, KLARE ([id number]) Necrotic Quality: Adherent Slough Tendon Exposed: No Muscle Exposed: No Joint Exposed: No Bone Exposed: No Limited to Skin Breakdown Periwound Skin Texture Texture Color No Abnormalities Noted: No No Abnormalities Noted: No Callus: No Atrophie Blanche: No Crepitus: No Cyanosis: No Excoriation: No Ecchymosis: No Fluctuance: No Erythema: No Friable: No Hemosiderin Staining: Yes Induration: No Mottled: No Localized Edema: Yes Pallor: No Rash: No Rubor: No Scarring: No Temperature / Pain Moisture Temperature: No Abnormality No Abnormalities Noted: No Tenderness on Palpation: Yes Dry / Scaly: No Maceration: No Moist: Yes Wound Preparation Ulcer Cleansing: Rinsed/Irrigated with Saline Topical Anesthetic Applied: Other: Lidocaine 4% Ointment  , Treatment Notes Wound #1 (Right, Lateral Malleolus) 1. Cleansed with: Cleanse wound with antibacterial soap and water 2. Anesthetic Topical Lidocaine 4% cream to wound bed prior to debridement 4. Dressing Applied: Prisma Ag 5. Secondary Dressing Applied Non-Adherent pad 7. Secured with Henriette Combs to Right Lower Extremity Electronic Signature(s) Signed: 10/05/2014 5:19:44 PM By: Renee Harder RN Entered By: Renee Harder on 10/05/2014 16:03:19 Chalfin, [name] [name] ([id number]) -------------------------------------------------------------------------------- Wound Assessment Details Patient Name: KACI, DILLIE. Date  of Service: 10/05/2014 3:30 PM Medical Record Number: [id number] Patient Account Number: [id number] Date of Birth/Sex: Apr 13, 1986 (28 y.o. Female) Treating RN: Renee Harder Primary Care Physician: Felix Pacini Other Clinician: Referring Physician: Claiborne Billings, RENEE Treating Physician/Extender: Rudene Re in Treatment: 2 Wound Status Wound Number: 2 Primary Etiology: Lymphedema Wound Location: Right Lower Leg - Medial Wound Status: Open Wounding Event: Not Known Date Acquired: 09/27/2014 Weeks Of Treatment: 1 Clustered Wound: No Photos Photo Uploaded By: Curtis Sites on 10/05/2014 17:20:01 Wound Measurements Length: (cm) 0.3 Width: (cm) 0.3 Depth: (cm) 0.1 Area: (cm) 0.071 Volume: (cm) 0.007 % Reduction in Area: 0% % Reduction in Volume: 0% Epithelialization: Small (1-33%) Tunneling: No Undermining: No Wound Description Full Thickness Without Exposed Classification: Support Structures Wound Margin: Distinct, outline attached Exudate Small Amount: Exudate Type: Serosanguineous Exudate Color: red, brown Foul Odor After Cleansing: No Wound Bed Granulation Amount: None Present (0%) Exposed Structure Necrotic Amount: Large (67-100%) Fascia Exposed: No Necrotic Quality: Eschar Fat Layer Exposed: No Delfino, Shanessa J. ([id number]) Tendon  Exposed: No Muscle Exposed: No Joint Exposed: No Bone Exposed: No Limited to Skin Breakdown Periwound Skin Texture Texture Color No Abnormalities Noted: No No Abnormalities Noted: No Callus: No Atrophie Blanche: No Crepitus: No Cyanosis: No Excoriation: No Ecchymosis: No Fluctuance: No Erythema: No Friable: No Hemosiderin Staining: Yes Induration: No Mottled: No Localized Edema: Yes Pallor: No Rash: No Rubor: No Scarring: No Temperature / Pain Moisture Temperature: No Abnormality No Abnormalities Noted: No Tenderness on Palpation: Yes Dry / Scaly: Yes Maceration: No Moist: Yes Wound Preparation Ulcer Cleansing: Rinsed/Irrigated with Saline Topical Anesthetic Applied: Other: Lidocaine 4% Ointment, Treatment Notes Wound #2 (Right, Medial Lower Leg) 1. Cleansed with: Cleanse wound with antibacterial soap and water 2. Anesthetic Topical Lidocaine 4% cream to wound bed prior to debridement 4. Dressing Applied: Prisma Ag 5. Secondary Dressing Applied Non-Adherent pad 7. Secured with Henriette Combs to Right Lower Extremity Electronic Signature(s) Signed: 10/05/2014 5:19:44 PM By: Renee Harder RN Entered By: Renee Harder on 10/05/2014 16:03:36 Fanguy, [name] [name] ([id number]) -------------------------------------------------------------------------------- Vitals Details Patient Name: LIELLE, VANDERVORT Date of Service: 10/05/2014 3:30 PM Medical Record Number: [id number] Patient Account Number: [id number] Date of Birth/Sex: 04-11-1986 (28 y.o. Female) Treating RN: Renee Harder Primary Care Physician: Felix Pacini Other Clinician: Referring Physician: Felix Pacini Treating Physician/Extender: Rudene Re in Treatment: 2 Vital Signs Time Taken: 15:55 Temperature (F): 97.9 Height (in): 66 Pulse (bpm): 92 Weight (lbs): 350.3 Respiratory Rate (breaths/min): 16 Body Mass Index (BMI): 56.5 Blood Pressure (mmHg): 117/74 Reference Range: 80 - 120 mg /  dl Electronic Signature(s) Signed: 10/05/2014 5:19:44 PM By: Renee Harder RN Entered By: Renee Harder on 10/05/2014 15:55:42

## 2014-10-07 NOTE — Progress Notes (Signed)
ALOIS, MINCER ([id number]) Visit Report for 10/05/2014 Chief Complaint Document Details Patient Name: [name], [name]. Date of Service: 10/05/2014 3:30 PM Medical Record Number: [id number] Patient Account Number: [id number] Date of Birth/Sex: [birth year] (29 y.o. Female) Treating RN: Primary Care Physician: KUNEFF, RENEE Other Clinician: Referring Physician: Claiborne Billings, RENEE Treating Physician/Extender: Rudene Re in Treatment: 2 Information Obtained from: Patient Chief Complaint Patient presents for treatment of an open ulcer due to venous insufficiency. this 29 year old patient has had an ulcer on the lateral part of her right lower extremity and this has been there for 2 weeks. Electronic Signature(s) Signed: 10/05/2014 4:17:08 PM By: Evlyn Kanner MD, FACS Entered By: Evlyn Kanner on 10/05/2014 16:00:26 [name], [name] ([id number]) -------------------------------------------------------------------------------- HPI Details Patient Name: [name], [name]. Date of Service: 10/05/2014 3:30 PM Medical Record Number: [id number] Patient Account Number: [id number] Date of Birth/Sex: [birth year] (29 y.o. Female) Treating RN: Primary Care Physician: KUNEFF, RENEE Other Clinician: Referring Physician: KUNEFF, RENEE Treating Physician/Extender: Rudene Re in Treatment: 2 History of Present Illness Location: the lateral part of her right ankle Quality: Patient reports experiencing a dull pain to affected area(s). Severity: Patient states wound are getting worse. Duration: Patient has had the wound for < 2 weeks prior to presenting for treatment Timing: Pain in wound is Intermittent (comes and goes Context: The wound appeared gradually over time Modifying Factors: Consults to this date include:seen in the ER and asked to follow up with wound care. Associated Signs and Symptoms: Patient reports having difficulty standing for long periods. HPI Description: 29 year old  patient who started with having ulcerations on the right lower leg on the lateral part of her ankle for about 2 weeks. She was seen in the ER at [facility] and advised to see the wound care for a consultation. No X-rays of workup was done during the ER visit and no prescription for any medications of compression wraps were given. the patient is not diabetic but does have hypertension and her medications have been reviewed by me. 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 The right and left greater saphenous vein was found to be tortuous. Deep venous system was also not competent and there was reflux of more than 500 ms. She was then seen by Dr. Tawanna Cooler Early who recommended that the patient would not benefit from endovenous ablation and he had recommended vein stripping on the right side and multiple small phlebectomy procedures on the left side. the patient did not follow-up due to social economic reasons. She has not been wearing any compression stockings and has not taken any specific treatment for varicose veins for the last 3 years. 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 We have obtained some appointments to see the vascular surgeons by the end of the month and the patient would like to follow up with me at my [facility] on Wednesday, June 29. Electronic Signature(s) Signed: 10/05/2014 4:17:08 PM By: Evlyn Kanner MD, FACS Entered By: Evlyn Kanner on 10/05/2014 16:13:21 [name], [name] ([id number]) -------------------------------------------------------------------------------- Physical Exam Details Patient Name: [name], [name]. Date of Service: 10/05/2014 3:30 PM Medical Record Number: [id number] Patient  Account Number: [id number] Date of Birth/Sex: 08-28-1985 (29 y.o. Female) Treating RN: Primary Care Physician: KUNEFF,  RENEE Other Clinician: Referring Physician: KUNEFF, RENEE Treating Physician/Extender: Evlyn Kanner Weeks in Treatment: 2 Constitutional . Pulse regular. Respirations normal and unlabored. Afebrile. . Eyes Nonicteric. Reactive to light. Ears, Nose, Mouth, and Throat Lips, teeth, and gums WNL.Marland Kitchen Moist mucosa without lesions . Neck supple and nontender. No palpable supraclavicular or cervical adenopathy. Normal sized without goiter. Respiratory WNL. No retractions.. Cardiovascular Pedal Pulses WNL. No clubbing, cyanosis or edema. Musculoskeletal Adexa without tenderness or enlargement.. Digits and nails w/o clubbing, cyanosis, infection, petechiae, ischemia, or inflammatory conditions.. Integumentary (Hair, Skin) wound measures smaller and is clean and we will change the dressing as planned.. No crepitus or fluctuance. No peri-wound warmth or erythema. No masses.Marland Kitchen Psychiatric Judgement and insight Intact.. No evidence of depression, anxiety, or agitation.. Electronic Signature(s) Signed: 10/05/2014 4:17:08 PM By: Evlyn Kanner MD, FACS Entered By: Evlyn Kanner on 10/05/2014 16:14:05 [name], [name] ([id number]) -------------------------------------------------------------------------------- Physician Orders Details Patient Name: [name], [name]. Date of Service: 10/05/2014 3:30 PM Medical Record Number: [id number] Patient Account Number: [id number] Date of Birth/Sex: [birth year] (29 y.o. Female) Treating RN: Renee Harder Primary Care Physician: Felix Pacini Other Clinician: Referring Physician: Felix Pacini Treating Physician/Extender: Rudene Re in Treatment: 2 Verbal / Phone Orders: Yes Clinician: Renee Harder Read Back and Verified: Yes Diagnosis Coding ICD-10 Coding Code Description I83.013 Varicose veins of right lower extremity with  ulcer of ankle L97.312 Non-pressure chronic ulcer of right ankle with fat layer exposed E66.01 Morbid (severe) obesity due to excess calories Wound Cleansing Wound #1 Right,Lateral Malleolus o Cleanse wound with mild soap and water Wound #2 Right,Medial Lower Leg o Cleanse wound with mild soap and water Anesthetic Wound #1 Right,Lateral Malleolus o Topical Lidocaine 4% cream applied to wound bed prior to debridement Wound #2 Right,Medial Lower Leg o Topical Lidocaine 4% cream applied to wound bed prior to debridement Primary Wound Dressing Wound #1 Right,Lateral Malleolus o Prisma Ag Wound #2 Right,Medial Lower Leg o Prisma Ag Secondary Dressing Wound #1 Right,Lateral Malleolus o ABD and Kerlix/Conform Dressing Change Frequency Wound #1 Right,Lateral Malleolus o Change dressing every week Wound #2 Right,Medial Lower Leg [name], [name] ([id number]) o Change dressing every week Follow-up Appointments Wound #1 Right,Lateral Malleolus o Return Appointment in 1 week. - Pt to start care at [facility]- to see Dr. Meyer Russel Wednesday 6/30 Edema Control Wound #1 Right,Lateral Malleolus o Unna Boot to Right Lower Extremity Wound #2 Right,Medial Lower Leg o Unna Boot to Right Lower Extremity Additional Orders / Instructions Wound #1 Right,Lateral Malleolus o Other: - continue working on weight management Wound #2 Right,Medial Lower Leg o Other: - continue working on [employer]) Signed: 10/05/2014 4:17:08 PM By: Evlyn Kanner MD, FACS Signed: 10/05/2014 5:19:44 PM By: Renee Harder RN Entered By: Renee Harder on 10/05/2014 16:10:13 Etcheverry, [name] ([id number]) -------------------------------------------------------------------------------- Problem List Details Patient Name: [name], [name]. Date of Service: 10/05/2014 3:30 PM Medical Record Number: [id number] Patient Account Number: [id number] Date of  Birth/Sex: [birth year] (29 y.o. Female) Treating RN: Primary Care Physician: Felix Pacini Other Clinician: Referring Physician: KUNEFF, RENEE Treating Physician/Extender: Rudene Re in Treatment: 2 Active Problems ICD-10 Encounter Code Description Active Date Diagnosis I83.013 Varicose veins of right lower extremity with ulcer of ankle 09/20/2014 Yes L97.312 Non-pressure chronic ulcer of right ankle with fat layer 09/20/2014 Yes exposed E66.01 Morbid (severe) obesity due to excess calories 09/20/2014 Yes Inactive Problems Resolved Problems Electronic Signature(s) Signed: 10/05/2014 4:17:08 PM By: Evlyn Kanner MD, FACS Entered By: Evlyn Kanner on 10/05/2014 16:00:16  [name], [name] ([id number]) -------------------------------------------------------------------------------- Progress Note Details Patient Name: [name], [name]. Date of Service: 10/05/2014 3:30 PM Medical Record Number: [id number] Patient Account Number: [id number] Date of Birth/Sex: [birth year] (29 y.o. Female) Treating RN: Primary Care Physician: KUNEFF, RENEE Other Clinician: Referring Physician: Claiborne Billings, RENEE Treating Physician/Extender: Rudene Re in Treatment: 2 Subjective Chief Complaint Information obtained from Patient Patient presents for treatment of an open ulcer due to venous insufficiency. this 29 year old patient has had an ulcer on the lateral part of her right lower extremity and this has been there for 2 weeks. History of Present Illness (HPI) The following HPI elements were documented for the patient's wound: Location: the lateral part of her right ankle Quality: Patient reports experiencing a dull pain to affected area(s). Severity: Patient states wound are getting worse. Duration: Patient has had the wound for < 2 weeks prior to presenting for treatment Timing: Pain in wound is Intermittent (comes and goes Context: The wound appeared gradually over time Modifying Factors:  Consults to this date include:seen in the ER and asked to follow up with wound care. Associated Signs and Symptoms: Patient reports having difficulty standing for long periods. 29 year old patient who started with having ulcerations on the right lower leg on the lateral part of her ankle for about 2 weeks. She was seen in the ER at [facility] and advised to see the wound care for a consultation. No X-rays of workup was done during the ER visit and no prescription for any medications of compression wraps were given. the patient is not diabetic but does have hypertension and her medications have been reviewed by me. 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 The right and left greater saphenous vein was found to be tortuous. Deep venous system was also not competent and there was reflux of more than 500 ms. She was then seen by Dr. Tawanna Cooler Early who recommended that the patient would not benefit from endovenous ablation and he had recommended vein stripping on the right side and multiple small phlebectomy procedures on the left side. the patient did not follow-up due to social economic reasons. She has not been wearing any compression stockings and has not taken any specific treatment for varicose veins for the last 3 years. 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 We have obtained some appointments to see the vascular surgeons by the end of the month and the patient would like to follow up with me at my [facility] on Wednesday, June 29. [name], [name] ([id number]) Objective Constitutional Pulse regular. Respirations normal and unlabored. Afebrile. Vitals Time Taken: 3:55 PM, Height: 66 in, Weight: 350.3 lbs, BMI: 56.5,  Temperature: 97.9 F, Pulse: 92 bpm, Respiratory Rate: 16 breaths/min, Blood Pressure: 117/74 mmHg. Eyes Nonicteric. Reactive to light. Ears, Nose, Mouth, and Throat Lips, teeth, and gums WNL.Marland Kitchen Moist mucosa without lesions . Neck supple and nontender. No palpable supraclavicular or cervical adenopathy. Normal sized without goiter. Respiratory WNL. No retractions.. Cardiovascular Pedal Pulses WNL. No clubbing, cyanosis or edema. Musculoskeletal Adexa without tenderness or enlargement.. Digits and nails w/o clubbing, cyanosis, infection, petechiae, ischemia, or inflammatory conditions.Marland Kitchen Psychiatric Judgement and insight Intact.. No evidence of depression, anxiety, or agitation.. Integumentary (Hair, Skin) wound measures smaller and  is clean and we will change the dressing as planned.. No crepitus or fluctuance. No peri-wound warmth or erythema. No masses.. Wound #1 status is Open. Original cause of wound was Not Known. The wound is located on the Right,Lateral Malleolus. The wound measures 1.3cm length x 2cm width x 0.2cm depth; 2.042cm^2 area and 0.408cm^3 volume. The wound is limited to skin breakdown. There is no tunneling or undermining noted. There is a medium amount of serosanguineous drainage noted. The wound margin is distinct with the outline attached to the wound base. There is large (67-100%) red granulation within the wound bed. There is a small (1-33%) amount of necrotic tissue within the wound bed including Adherent Slough. The periwound skin appearance exhibited: Localized Edema, Moist, Hemosiderin Staining. The periwound skin appearance did not exhibit: Callus, Crepitus, Excoriation, Fluctuance, Friable, Induration, Rash, Scarring, Dry/Scaly, Maceration, Atrophie Blanche, Cyanosis, Ecchymosis, Mottled, Pallor, Rubor, Erythema. Periwound temperature was noted as No Abnormality. The periwound has tenderness on palpation. [name], [name] ([id number]) Wound #2 status is  Open. Original cause of wound was Not Known. The wound is located on the Right,Medial Lower Leg. The wound measures 0.3cm length x 0.3cm width x 0.1cm depth; 0.071cm^2 area and 0.007cm^3 volume. The wound is limited to skin breakdown. There is no tunneling or undermining noted. There is a small amount of serosanguineous drainage noted. The wound margin is distinct with the outline attached to the wound base. There is no granulation within the wound bed. There is a large (67- 100%) amount of necrotic tissue within the wound bed including Eschar. The periwound skin appearance exhibited: Localized Edema, Dry/Scaly, Moist, Hemosiderin Staining. The periwound skin appearance did not exhibit: Callus, Crepitus, Excoriation, Fluctuance, Friable, Induration, Rash, Scarring, Maceration, Atrophie Blanche, Cyanosis, Ecchymosis, Mottled, Pallor, Rubor, Erythema. Periwound temperature was noted as No Abnormality. The periwound has tenderness on palpation. 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 She is going to see the vascular surgeon at the end of this month. She will be seeing me in the [facility] wound center next week on Wednesday. Plan Wound Cleansing: Wound #1 Right,Lateral Malleolus: Cleanse wound with mild soap and water Wound #2 Right,Medial Lower Leg: Cleanse wound with mild soap and water Anesthetic: Wound #1 Right,Lateral Malleolus: [name], [name] ([id number]) Topical Lidocaine 4% cream applied  to wound bed prior to debridement Wound #2 Right,Medial Lower Leg: Topical Lidocaine 4% cream applied to wound bed prior to debridement Primary Wound Dressing: Wound #1 Right,Lateral Malleolus: Prisma Ag Wound #2 Right,Medial Lower Leg: Prisma Ag Secondary Dressing: Wound #1 Right,Lateral Malleolus: ABD and Kerlix/Conform Dressing Change Frequency: Wound #1 Right,Lateral Malleolus: Change dressing every week Wound #2 Right,Medial Lower Leg: Change dressing every week Follow-up Appointments: Wound #1 Right,Lateral Malleolus: Return Appointment in 1 week. - Pt to start care at [facility]- to see Dr. Meyer Russel Wednesday 6/30 Edema Control: Wound #1 Right,Lateral Malleolus: Roland Rack Boot to Right Lower Extremity Wound #2 Right,Medial Lower Leg: Unna Boot to Right Lower Extremity Additional Orders / Instructions: Wound #1 Right,Lateral Malleolus: Other: - continue working  on weight management Wound #2 Right,Medial Lower Leg: Other: - continue working on weight management Follow-Up Appointments: A follow-up appointment should be scheduled. A Patient Clinical Summary of Care was provided to [facility] We will use Prisma and a Unna's boot and continue with elevation of her limbs as before. She is going to see the vascular surgeon at the end of this month. She will be seeing me in the [facility] wound center next week on Wednesday. Electronic Signature(s) CAEDANCE, ZUPAN ([id number]) Signed: 10/05/2014 5:15:58 PM By: Renee Harder RN Signed: 10/06/2014 5:31:33 PM By: Evlyn Kanner MD, FACS Previous Signature: 10/05/2014 4:17:08 PM Version By: Evlyn Kanner MD, FACS Entered By: Renee Harder on 10/05/2014 17:15:58 Erekson, [name] ([id number]) -------------------------------------------------------------------------------- SuperBill Details Patient Name: NOELIA, KALFAS. Date of Service: 10/05/2014 Medical Record Number: [id number] Patient Account Number: [id number] Date of  Birth/Sex: [birth year] (29 y.o. 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 E66.01 Morbid (severe) obesity due to excess calories Facility Procedures CPT4 Code: [id number] Description: (Facility Use Only) [id number] - APPLY Roland Rack BOOT RT Modifier: Quantity: 1 Physician Procedures CPT4 Code Description: [id number] - WC PHYS LEVEL 3 - EST PT ICD-10 Description Diagnosis I83.013 Varicose veins of right lower extremity with ulcer L97.312 Non-pressure chronic ulcer of right ankle with fat E66.01 Morbid (severe) obesity due to  excess calories Modifier: of ankle layer expose Quantity: 1 d Electronic Signature(s) Signed: 10/05/2014 5:19:44 PM By: Renee Harder RN Signed: 10/06/2014 5:31:33 PM By: Evlyn Kanner MD, FACS Previous Signature: 10/05/2014 4:17:08 PM Version By: Evlyn Kanner MD, FACS Entered By: Renee Harder on 10/05/2014 16:27:14

## 2014-10-13 ENCOUNTER — Encounter (HOSPITAL_BASED_OUTPATIENT_CLINIC_OR_DEPARTMENT_OTHER): Payer: BLUE CROSS/BLUE SHIELD | Attending: Surgery

## 2014-10-13 ENCOUNTER — Encounter: Payer: Self-pay | Admitting: Vascular Surgery

## 2014-10-14 ENCOUNTER — Encounter: Payer: BLUE CROSS/BLUE SHIELD | Admitting: Surgery

## 2014-10-14 ENCOUNTER — Ambulatory Visit (HOSPITAL_COMMUNITY)
Admission: RE | Admit: 2014-10-14 | Discharge: 2014-10-14 | Disposition: A | Discharge status: Home / Self Care | Payer: BLUE CROSS/BLUE SHIELD | Source: Ambulatory Visit | Attending: Vascular Surgery | Admitting: Vascular Surgery

## 2014-10-14 DIAGNOSIS — L97312 Non-pressure chronic ulcer of right ankle with fat layer exposed: Secondary | ICD-10-CM | POA: Diagnosis not present

## 2014-10-14 DIAGNOSIS — L97909 Non-pressure chronic ulcer of unspecified part of unspecified lower leg with unspecified severity: Secondary | ICD-10-CM

## 2014-10-14 DIAGNOSIS — I83009 Varicose veins of unspecified lower extremity with ulcer of unspecified site: Secondary | ICD-10-CM | POA: Diagnosis not present

## 2014-10-15 ENCOUNTER — Encounter: Payer: BLUE CROSS/BLUE SHIELD | Attending: Surgery

## 2014-10-15 DIAGNOSIS — L97312 Non-pressure chronic ulcer of right ankle with fat layer exposed: Secondary | ICD-10-CM | POA: Diagnosis not present

## 2014-10-15 DIAGNOSIS — I83013 Varicose veins of right lower extremity with ulcer of ankle: Secondary | ICD-10-CM | POA: Insufficient documentation

## 2014-10-15 NOTE — Progress Notes (Signed)
Conley SimmondsSUMMERS, Kaeleigh J. ([id number]) Visit Report for 10/15/2014 Arrival Information Details Patient Name: Conley SimmondsSUMMERS, Endiya J. Date of Service: 10/15/2014 8:45 AM Medical Record Patient Account Number: [id number] Number: [name], RN, [name], Treating RN: 03/13/1986 (28 y.o. [place] Sinkita Date of Birth/Sex: Female) Other Clinician: Primary Care Physician: Tawni CarnesWIGHT, ANDREW Treating Britto, Errol Referring Physician: Tawni CarnesWIGHT, ANDREW Physician/Extender: Tania AdeWeeks in Treatment: 3 Visit Information History Since Last Visit Any new allergies or adverse reactions: No Patient Arrived: Ambulatory Had a fall or experienced change in No Arrival Time: 08:54 activities of daily living that may affect Accompanied By: self risk of falls: Transfer Assistance: None Signs or symptoms of abuse/neglect since last No Patient Identification Verified: Yes visito Secondary Verification Process Yes Hospitalized since last visit: No Completed: Has Dressing in Place as Prescribed: Yes Patient Has Alerts: Yes Pain Present Now: No Patient Alerts: 09/2013 ABI R: 1.3 Electronic Signature(s) Signed: 10/15/2014 2:48:51 PM By: Elpidio EricAfful, Rita [name], RN Entered By: Elpidio EricAfful, Rita on 10/15/2014 09:19:38 Geary, [name] ([id number]) -------------------------------------------------------------------------------- Encounter Discharge Information Details Patient Name: Conley SimmondsSUMMERS, Nabiha J. Date of Service: 10/15/2014 8:45 AM Medical Record Patient Account Number: [id number] Number: [name], RN, [name], Treating RN: 11/28/1985 (28 y.o. [place] Sinkita Date of Birth/Sex: Female) Other Clinician: Primary Care Physician: Tawni CarnesWIGHT, ANDREW Treating Britto, Errol Referring Physician: Tawni CarnesWIGHT, ANDREW Physician/Extender: Weeks in Treatment: 3 Encounter Discharge Information Items Discharge Pain Level: 0 Discharge Condition: Stable Ambulatory Status: Ambulatory Discharge Destination: Home Private Transportation: Auto Accompanied By: self Schedule Follow-up  Appointment: No Medication Reconciliation completed and No provided to Patient/Care Mattie Nordell: Clinical Summary of Care: Electronic Signature(s) Signed: 10/15/2014 2:48:51 PM By: Elpidio EricAfful, Rita [name], RN Entered By: Elpidio EricAfful, Rita on 10/15/2014 09:18:31 Stairs, [name] ([id number]) -------------------------------------------------------------------------------- Patient/Caregiver Education Details Patient Name: Conley SimmondsSUMMERS, Caris J. Date of Service: 10/15/2014 8:45 AM Medical Record Patient Account Number: [id number] Number: [name], RN, [name], Treating RN: 10/11/1985 (28 y.o. [place] Sinkita Date of Birth/Gender: Female) Other Clinician: Primary Care Physician: Tawni CarnesWIGHT, ANDREW Treating Evlyn KannerBritto, Errol Referring Physician: Tawni CarnesWIGHT, ANDREW Physician/Extender: Tania AdeWeeks in Treatment: 3 Education Assessment Education Provided To: Patient Education Topics Provided Basic Hygiene: Methods: Explain/Verbal Responses: State content correctly Welcome To The [facility]: Methods: Explain/Verbal Responses: State content correctly Electronic Signature(s) Signed: 10/15/2014 2:48:51 PM By: Elpidio EricAfful, Rita [name], RN Entered By: Elpidio EricAfful, Rita on 10/15/2014 09:18:15

## 2014-10-15 NOTE — Progress Notes (Signed)
MADISSON, KULAGA ([id number]) Visit Report for 10/14/2014 Chief Complaint Document Details Patient Name: [name], [name] 10/14/2014 11:00 Date of Service: AM Medical Record [id number] Number: Patient Account Number: [id number] 03/15/1986 (29 y.o. Treating RN: Date of Birth/Sex: Female) Other Clinician: Primary Care Physician: Tawni Carnes Treating Lowana Hable Referring Physician: Tawni Carnes Physician/Extender: Tania Ade in Treatment: 3 Information Obtained from: Patient Chief Complaint Patient presents for treatment of an open ulcer due to venous insufficiency. this 29 year old patient has had an ulcer on the lateral part of her right lower extremity and this has been there for 2 weeks. Electronic Signature(s) Signed: 10/14/2014 1:47:03 PM By: Evlyn Kanner MD, FACS Entered By: Evlyn Kanner on 10/14/2014 12:53:33 Rutten, [name] ([id number]) -------------------------------------------------------------------------------- HPI Details Patient Name: [name], [name] 10/14/2014 11:00 Date of Service: AM Medical Record [id number] Number: Patient Account Number: [id number] 1985-08-01 (29 y.o. Treating RN: Date of Birth/Sex: Female) Other Clinician: Primary Care Physician: Tawni Carnes Treating Levi Klaiber Referring Physician: Tawni Carnes Physician/Extender: Weeks in Treatment: 3 History of Present Illness Location: the lateral part of her right ankle Quality: Patient reports experiencing a dull pain to affected area(s). Severity: Patient states wound are getting worse. Duration: Patient has had the wound for < 2 weeks prior to presenting for treatment Timing: Pain in wound is Intermittent (comes and goes Context: The wound appeared gradually over time Modifying Factors: Consults to this date include:seen in the ER and asked to follow up with wound care. Associated Signs and Symptoms: Patient reports having difficulty standing for long periods. HPI Description:  29 year old patient who started with having ulcerations on the right lower leg on the lateral part of her ankle for about 2 weeks. She was seen in the ER at [facility] and advised to see the wound care for a consultation. No X-rays of workup was done during the ER visit and no prescription for any medications of compression wraps were given. the patient is not diabetic but does have hypertension and her medications have been reviewed by me. 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 The right and left greater saphenous vein was found to be tortuous. Deep venous system was also not competent and there was reflux of more than 500 ms. She was then seen by Dr. Tawanna Cooler Early who recommended that the patient would not benefit from endovenous ablation and he had recommended vein stripping on the right side and multiple small phlebectomy procedures on the left side. the patient did not follow-up due to social economic reasons. She has not been wearing any compression stockings and has not taken any specific treatment for varicose veins for the last 3 years. 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 We have obtained some appointments to see the vascular surgeons by the end of the month and the patient would like to follow up with me at my [facility] on Wednesday, June 29. 10/14/2014 -- she could not see me yesterday in [place] and hence has come for a review today. She has a vascular workup to be done this afternoon at [facility]. She is doing fine otherwise. Electronic Signature(s) Signed: 10/14/2014 1:47:03 PM By: Evlyn Kanner MD, FACS Entered By: Evlyn Kanner on 10/14/2014 12:54:26 Fader, [name] ([id number]) Ostrand,  ZAIA CARRE ([id number]) -------------------------------------------------------------------------------- Physical Exam Details Patient Name: [name], [name] 10/14/2014 11:00 Date of Service: AM Medical Record [id number] Number: Patient Account Number: [id number] April 07, 1986 (29 y.o. Treating RN: Date of Birth/Sex: Female) Other Clinician: Primary Care Physician: Tawni Carnes Treating Mcclellan Demarais Referring Physician: Tawni Carnes Physician/Extender: Weeks in Treatment: 3 Constitutional . Pulse regular. Respirations normal and unlabored. Afebrile. . Eyes Nonicteric. Reactive to light. Ears, Nose, Mouth, and Throat Lips, teeth, and gums WNL.Marland Kitchen Moist mucosa without lesions . Neck supple and nontender. No palpable supraclavicular or cervical adenopathy. Normal sized without goiter. Respiratory WNL. No retractions.. Cardiovascular Pedal Pulses WNL. No clubbing, cyanosis or edema. Musculoskeletal Adexa without tenderness or enlargement.. Digits and nails w/o clubbing, cyanosis, infection, petechiae, ischemia, or inflammatory conditions.. Integumentary (Hair, Skin) the ulceration on the medial part of her ankle is completely healed. On the lateral side she still has some ulceration which is cleant with a saline gauze.Marland Kitchen No crepitus or fluctuance. No peri-wound warmth or erythema. No masses.Marland Kitchen Psychiatric Judgement and insight Intact.. No evidence of depression, anxiety, or agitation.. Electronic Signature(s) Signed: 10/14/2014 1:47:03 PM By: Evlyn Kanner MD, FACS Entered By: Evlyn Kanner on 10/14/2014 12:55:31 Holzheimer, [name] ([id number]) -------------------------------------------------------------------------------- Physician Orders Details Patient Name: [name], [name] 10/14/2014 11:00 Date of Service: AM Medical Record [id number] Number: Patient Account Number: [id number] Apr 22, 1985 (29 y.o. Treating RN: [name] Date of Birth/Sex: Female) Other Clinician: Primary Care  Physician: Tawni Carnes Treating Pilar Corrales Referring Physician: Tawni Carnes Physician/Extender: Tania Ade in Treatment: 3 Verbal / Phone Orders: Yes Clinician: [name] Read Back and Verified: Yes Diagnosis Coding Wound Cleansing Wound #1 Right,Lateral Malleolus o Cleanse wound with mild soap and water Anesthetic Wound #1 Right,Lateral Malleolus o Topical Lidocaine 4% cream applied to wound bed prior to debridement Primary Wound Dressing Wound #1 Right,Lateral Malleolus o Prisma Ag Secondary Dressing Wound #1 Right,Lateral Malleolus o Boardered Foam Dressing Dressing Change Frequency Wound #1 Right,Lateral Malleolus o Other: - If not wrapped at vascular appointment, will return tomorrow for re-wrap. Follow-up Appointments Wound #1 Right,Lateral Malleolus o Return Appointment in 1 week. o Nurse Visit as needed - re-wrap after vascular testing, if needed. Edema Control Wound #1 Right,Lateral Malleolus o Unna Boot to Right Lower Extremity - Prisma applied to wound and covered with bordered foam dressing. Pease remove bordered foam and replace with gauze before applying unna's boot. Electronic Signature(s) Signed: 10/14/2014 1:47:03 PM By: Evlyn Kanner MD, FACS MEDINA, DEGRAFFENREID ([id number]) Signed: 10/15/2014 4:20:29 PM By: Elliot Gurney RN, [name], [name], [name] Entered By: Elliot Gurney, RN, [name], Kim on 10/14/2014 11:53:49 Teixeira, [name] ([id number]) -------------------------------------------------------------------------------- Problem List Details Patient Name: DELAILA, [name] 10/14/2014 11:00 Date of Service: AM Medical Record [id number] Number: Patient Account Number: [id number] May 20, 1985 (28 y.o. 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 Treating RN: Date of Birth/Sex: Female)  Other Clinician: Primary Care Physician: Tawni CarnesWIGHT, ANDREW Treating Evlyn KannerBritto, Kaeleen Odom Referring Physician: Tawni CarnesWIGHT, ANDREW Physician/Extender: Tania AdeWeeks in Treatment: 3 Subjective Chief Complaint Information obtained from Patient Patient presents for treatment of an open ulcer due to venous insufficiency. this 29 year old patient has had an ulcer on the lateral part of her right lower extremity and this has been there for 2 weeks. History of Present Illness (HPI) The following HPI elements were documented for the patient's wound: Location: the lateral part of her right ankle Quality: Patient reports experiencing a dull pain to affected area(s). Severity: Patient states wound are getting worse. Duration: Patient has had the wound for < 2 weeks prior to presenting for treatment Timing: Pain in wound is Intermittent (comes and goes Context: The wound appeared gradually over time Modifying Factors: Consults to this date include:seen in the ER and asked to follow up with wound care. Associated Signs and Symptoms: Patient reports having difficulty standing for long periods. 29 year old patient who started with having ulcerations on the right lower leg on the lateral  part of her ankle for about 2 weeks. [facility] and advised to see the wound care for a consultation. No X-rays of workup was done during the ER visit and no prescription for any medications of compression wraps were given. the patient is not diabetic but does have hypertension and her medications have been reviewed by me. 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 The right and left greater saphenous vein was found to be tortuous. Deep venous system was also not competent and there was reflux of more than 500 ms. She was then seen by Dr. Tawanna Coolerodd Early who recommended that the patient would not benefit from endovenous ablation and he had recommended vein stripping on the right side and multiple small phlebectomy procedures on the left side. the patient did not follow-up due to social economic reasons. She has not been wearing any compression stockings and has not taken any specific treatment for varicose veins for the last 3 years. 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 We have obtained some appointments to see the vascular surgeons by the end of the month and the patient would like to [facility] Wednesday, June [facility]. Conley SimmondsSUMMERS, Agape J. ([id number]) 10/14/2014 -- [place] and hence has come for a review today. [facility]. She is doing fine otherwise. Objective Constitutional Pulse regular. Respirations normal and unlabored. Afebrile. Vitals Time Taken: 11:19 AM, Height: 66 in, Weight: 350.3 lbs, BMI: 56.5, Temperature: 98.4 F, Pulse: 111 bpm, Respiratory Rate: 16 breaths/min,  Blood Pressure: 117/64 mmHg. Eyes Nonicteric. Reactive to light. Ears, Nose, Mouth, and Throat Lips, teeth, and gums WNL.Marland Kitchen. Moist mucosa without lesions . Neck supple and nontender. No palpable supraclavicular or cervical adenopathy. Normal sized without goiter. Respiratory WNL. No retractions.. Cardiovascular Pedal Pulses WNL. No clubbing, cyanosis or edema. Musculoskeletal Adexa without tenderness or enlargement.. Digits and nails w/o clubbing, cyanosis, infection, petechiae, ischemia, or inflammatory conditions.Marland Kitchen. Psychiatric Judgement and insight Intact.. No evidence of depression, anxiety, or agitation.. Integumentary (Hair, Skin) the ulceration on the medial  part of her ankle is completely healed. On the lateral side she still has some ulceration which is cleant with a saline gauze.Marland Kitchen No crepitus or fluctuance. No peri-wound warmth or erythema. No masses.. Wound #1 status is Open. Original cause of wound was Not Known. The wound is located on the Right,Lateral Malleolus. The wound measures 1.8cm length x 2.1cm width x 0.2cm depth; 2.969cm^2 area and 0.594cm^3 volume. The wound is limited to skin breakdown. There is a medium amount of LUX, SKILTON. ([id number]) serosanguineous drainage noted. The wound margin is distinct with the outline attached to the wound base. There is large (67-100%) red granulation within the wound bed. There is a small (1-33%) amount of necrotic tissue within the wound bed including Adherent Slough. The periwound skin appearance exhibited: Localized Edema, Moist, Hemosiderin Staining. The periwound skin appearance did not exhibit: Callus, Crepitus, Excoriation, Fluctuance, Friable, Induration, Rash, Scarring, Dry/Scaly, Maceration, Atrophie Blanche, Cyanosis, Ecchymosis, Mottled, Pallor, Rubor, Erythema. Periwound temperature was noted as No Abnormality. The periwound has tenderness on palpation. Wound #2 status is Healed - Epithelialized. Original cause  of wound was Not Known. The wound is located on the Right,Medial Lower Leg. The wound measures 0cm length x 0cm width x 0cm depth; 0cm^2 area and 0cm^3 volume. The wound is limited to skin breakdown. There is a small amount of serosanguineous drainage noted. The wound margin is distinct with the outline attached to the wound base. There is no granulation within the wound bed. There is no necrotic tissue within the wound bed. The periwound skin appearance exhibited: Dry/Scaly, Hemosiderin Staining. The periwound skin appearance did not exhibit: Callus, Crepitus, Excoriation, Fluctuance, Friable, Induration, Localized Edema, Rash, Scarring, Maceration, Moist, Atrophie Blanche, Cyanosis, Ecchymosis, Mottled, Pallor, Rubor, Erythema. Periwound temperature was noted as No Abnormality. The periwound has tenderness on palpation. 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 Since she is having a vascular workup this afternoon we will leave her with a foam border and have asked her to get to be wrapped after her vascular test. If she has any issue with getting this done she can come back here for a nurse visit. Plan Wound Cleansing: Wound #1 Right,Lateral Malleolus: Cleanse wound with mild soap and water Anesthetic: Wound #1 Right,Lateral Malleolus: Topical Lidocaine 4% cream applied to wound bed prior to debridement VICTORINE, MCNEE ([id number]) Primary Wound Dressing: Wound #1 Right,Lateral Malleolus: Prisma Ag Secondary Dressing: Wound #1 Right,Lateral Malleolus: Boardered Foam Dressing Dressing Change Frequency: Wound #1 Right,Lateral Malleolus: Other: - If not wrapped at vascular appointment, will return tomorrow for  re-wrap. Follow-up Appointments: Wound #1 Right,Lateral Malleolus: Return Appointment in 1 week. Nurse Visit as needed - re-wrap after vascular testing, if needed. Edema Control: Wound #1 Right,Lateral Malleolus: Unna Boot to Right Lower Extremity - Prisma applied to wound and covered with bordered foam dressing. Pease remove bordered foam and replace with gauze before applying unna's boot. We'll continue with local care with Prisma AG and an Unna's boot. Since she is having a vascular workup this afternoon we will leave her with a foam border and have asked her to get to be wrapped after her vascular test. If she has any issue with getting this done she can come back here for a nurse visit. Electronic Signature(s)  Signed: 10/14/2014 1:47:03 PM By: Evlyn Kanner MD, FACS Entered By: Evlyn Kanner on 10/14/2014 12:57:46 Viall, [name] ([id number]) -------------------------------------------------------------------------------- SuperBill Details Patient Name: [name], [name] Date of Service: 10/14/2014 Medical Record Number: [id number] Patient Account Number: [id number] Date of Birth/Sex: June 16, 1985 (28 y.o. 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 E66.01 Morbid (severe) obesity due to excess calories Facility Procedures CPT4 Code: [id number] Description: [phone] - WOUND CARE VISIT-LEV 2 EST PT Modifier: Quantity: 1 Physician Procedures CPT4 Code Description: [id number] - WC PHYS LEVEL 3 - EST PT ICD-10 Description Diagnosis I83.013 Varicose veins of right lower extremity with ulcer L97.312 Non-pressure chronic ulcer of right ankle with fat E66.01 Morbid  (severe) obesity due to  excess calories Modifier: of ankle layer expose Quantity: 1 d Electronic Signature(s) Signed: 10/14/2014 1:47:03 PM By: Evlyn Kanner MD, FACS Entered By: Evlyn Kanner on 10/14/2014 12:58:05

## 2014-10-15 NOTE — Progress Notes (Signed)
[name], [name] ([id number]) Visit Report for 10/14/2014 Arrival Information Details Patient Name: [name], [name]. Date of Service: 10/14/2014 11:00 AM Medical Record Number: [id number] Patient Account Number: [id number] Date of Birth/Sex: 19-Sep-1985 (28 y.o. Female) Treating RN: [name] Primary Care Physician: Tawni Carnes Other Clinician: Referring Physician: Tawni Carnes Treating Physician/Extender: Rudene Re in Treatment: 3 Visit Information History Since Last Visit Added or deleted any medications: No Patient Arrived: Ambulatory Any new allergies or adverse reactions: No Arrival Time: 11:17 Had a fall or experienced change in No Accompanied By: self activities of daily living that may affect Transfer Assistance: None risk of falls: Patient Identification Verified: Yes Signs or symptoms of abuse/neglect since last No Secondary Verification Process Yes visito Completed: Hospitalized since last visit: No Patient Has Alerts: Yes Has Dressing in Place as Prescribed: Yes Patient Alerts: 09/2013 ABI R: Has Compression in Place as Prescribed: Yes 1.3 Pain Present Now: No Electronic Signature(s) Signed: 10/15/2014 4:20:29 PM By: Elliot Gurney, RN, [name], [name], [name] Entered By: Elliot Gurney, RN, [name], Kim on 10/14/2014 11:18:58 [name], [name] ([id number]) -------------------------------------------------------------------------------- Clinic Level of Care Assessment Details Patient Name: [name]. Date of Service: 10/14/2014 11:00 AM Medical Record Number: [id number] Patient Account Number: [id number] Date of Birth/Sex: 1985-07-05 (28 y.o. Female) Treating RN: [name] Primary Care Physician: Tawni Carnes Other Clinician: Referring Physician: Tawni Carnes Treating Physician/Extender: Rudene Re in Treatment: 3 Clinic Level of Care Assessment Items TOOL 4 Quantity Score []  - Use when only an EandM is performed on FOLLOW-UP visit 0 ASSESSMENTS - Nursing  Assessment / Reassessment []  - Reassessment of Co-morbidities (includes updates in patient status) 0 X - Reassessment of Adherence to Treatment Plan 1 5 ASSESSMENTS - Wound and Skin Assessment / Reassessment X - Simple Wound Assessment / Reassessment - one wound 1 5 []  - Complex Wound Assessment / Reassessment - multiple wounds 0 []  - Dermatologic / Skin Assessment (not related to wound area) 0 ASSESSMENTS - Focused Assessment []  - Circumferential Edema Measurements - multi extremities 0 []  - Nutritional Assessment / Counseling / Intervention 0 []  - Lower Extremity Assessment (monofilament, tuning fork, pulses) 0 []  - Peripheral Arterial Disease Assessment (using hand held doppler) 0 ASSESSMENTS - Ostomy and/or Continence Assessment and Care []  - Incontinence Assessment and Management 0 []  - Ostomy Care Assessment and Management (repouching, etc.) 0 PROCESS - Coordination of Care X - Simple Patient / Family Education for ongoing care 1 15 []  - Complex (extensive) Patient / Family Education for ongoing care 0 []  - Staff obtains Chiropractor, Records, Test Results / Process Orders 0 []  - Staff telephones HHA, Nursing Homes / Clarify orders / etc 0 []  - Routine Transfer to another Facility (non-emergent condition) 0 [name], [name] ([id number]) []  - Routine Hospital Admission (non-emergent condition) 0 []  - New Admissions / Manufacturing engineer / Ordering NPWT, Apligraf, etc. 0 []  - Emergency Hospital Admission (emergent condition) 0 X - Simple Discharge Coordination 1 10 []  - Complex (extensive) Discharge Coordination 0 PROCESS - Special Needs []  - Pediatric / Minor Patient Management 0 []  - Isolation Patient Management 0 []  - Hearing / Language / Visual special needs 0 []  - Assessment of Community assistance (transportation, D/C planning, etc.) 0 []  - Additional assistance / Altered mentation 0 []  - Support Surface(s) Assessment (bed, cushion, seat, etc.) 0 INTERVENTIONS - Wound  Cleansing / Measurement X - Simple Wound Cleansing - one wound 1 5 []  - Complex Wound Cleansing - multiple wounds 0 X -  Wound Imaging (photographs - any number of wounds) 1 5 []  - Wound Tracing (instead of photographs) 0 X - Simple Wound Measurement - one wound 1 5  - Complex Wound Measurement - multiple wounds 0 INTERVENTIONS - Wound Dressings X - Small Wound Dressing one or multiple wounds 1 10  - Medium Wound Dressing one or multiple wounds 0  - Large Wound Dressing one or multiple wounds 0  - Application of Medications - topical 0  - Application of Medications - injection 0 INTERVENTIONS - Miscellaneous  - External ear exam 0 [name], [name]  - Specimen Collection (cultures, biopsies, blood, body fluids, etc.) 0  - Specimen(s) / Culture(s) sent or taken to Lab for analysis 0  - Patient Transfer (multiple staff / Michiel Sites Lift / Similar devices) 0  - Simple Staple / Suture removal (25 or less) 0  - Complex Staple / Suture removal (26 or more) 0  - Hypo / Hyperglycemic Management (close monitor of Blood Glucose) 0  - Ankle / Brachial Index (ABI) - do not check if billed separately 0 X - Vital Signs 1 5 Has the patient been seen at the hospital within the last three years: Yes Total Score: 65 Level Of Care: New/Established - Level 2 Electronic Signature(s) Signed: 10/15/2014 4:20:29 PM By: Elliot Gurney, RN, [name], [name], [name] Entered By: Elliot Gurney, RN, [name], Kim on 10/14/2014 12:03:12 [name] ([id number]) -------------------------------------------------------------------------------- Encounter Discharge Information Details Patient Name: [name]. Date of Service: 10/14/2014 11:00 AM Medical Record Number: [id number] Patient Account Number: [id number] Date of Birth/Sex: 1985-08-05 (28 y.o. Female) Treating RN: [name] Primary Care Physician: Tawni Carnes Other Clinician: Referring Physician: Tawni Carnes Treating Physician/Extender:  Rudene Re in Treatment: 3 Encounter Discharge Information Items Discharge Pain Level: 0 Discharge Condition: Stable Ambulatory Status: Ambulatory Discharge Destination: Home Private Transportation: Auto Accompanied By: self Schedule Follow-up Appointment: Yes Medication Reconciliation completed and Yes provided to Patient/Care Shiva Sahagian: Clinical Summary of Care: Electronic Signature(s) Signed: 10/15/2014 4:20:29 PM By: Elliot Gurney, RN, [name], [name], [name] Entered By: Elliot Gurney, RN, [name], Kim on 10/14/2014 12:04:06 [name] ([id number]) -------------------------------------------------------------------------------- Lower Extremity Assessment Details Patient Name: RAYANA, GEURIN. Date of Service: 10/14/2014 11:00 AM Medical Record Number: [id number] Patient Account Number: [id number] Date of Birth/Sex: 06/26/1985 (28 y.o. Female) Treating RN: [name] Primary Care Physician: Tawni Carnes Other Clinician: Referring Physician: Tawni Carnes Treating Physician/Extender: Rudene Re in Treatment: 3 Edema Assessment Assessed: [Left: No] [Right: No] E[Left: dema] [Right: :] Calf Left: Right: Point of Measurement: 34 cm From Medial Instep cm 54.8 cm Ankle Left: Right: Point of Measurement: 10 cm From Medial Instep cm 29.7 cm Vascular Assessment Pulses: Posterior Tibial Dorsalis Pedis P gmented] Hair Growth on Extremity: [Right:No] Temperature of Extremity: [Right:Warm] Capillary Refill: [Right:< 3 seconds] Toe Nail Assessment Left: Right: Thick: No Discolored: No Deformed: No Improper Length and Hygiene: No Electronic Signature(s) Signed: 10/15/2014 4:20:29 PM By: Elliot Gurney, RN, [name], [name], [name] Entered By: Elliot Gurney, RN, [name], Kim on 10/14/2014 11:33:00 Lacosse, [name]  ([id number]) -------------------------------------------------------------------------------- Multi Wound Chart Details Patient Name: ERYKA, DOLINGER. Date of Service: 10/14/2014 11:00 AM Medical Record Number: [id number] Patient Account Number: [id number] Date of Birth/Sex: 12/04/1985 (28 y.o. Female) Treating RN: [name] Primary Care Physician: Tawni Carnes Other Clinician: Referring Physician: Tawni Carnes Treating Physician/Extender: Rudene Re in Treatment: 3 Vital Signs Height(in): 66 Pulse(bpm): 111 Weight(lbs): 350.3 Blood Pressure 117/64 (mmHg): Body Mass  Index(BMI): 57 Temperature(F): 98.4 Respiratory Rate 16 (breaths/min): Photos: [1:No Photos] [2:No Photos] [N/A:N/A] Wound Location: [1:Right Malleolus - Lateral] [2:Right Lower Leg - Medial] [N/A:N/A] Wounding Event: [1:Not Known] [2:Not Known] [N/A:N/A] Primary Etiology: [1:Venous Leg Ulcer] [2:Lymphedema] [N/A:N/A] Date Acquired: [1:09/06/2014] [2:09/27/2014] [N/A:N/A] Weeks of Treatment: [1:3] [2:2] [N/A:N/A] Wound Status: [1:Open] [2:Healed - Epithelialized] [N/A:N/A] Measurements [name] x W x D 1.8x2.1x0.2 [2:0x0x0] [N/A:N/A] (cm) Area (cm) : [1:2.969] [2:0] [N/A:N/A] Volume (cm) : [1:0.594] [2:0] [N/A:N/A] % Reduction in Area: [1:-35.00%] [2:100.00%] [N/A:N/A] % Reduction in Volume: 10.00% [2:100.00%] [N/A:N/A] Classification: [1:Full Thickness Without Exposed Support Structures] [2:Full Thickness Without Exposed Support Structures] [N/A:N/A] Exudate Amount: [1:Medium] [2:Small] [N/A:N/A] Exudate Type: [1:Serosanguineous] [2:Serosanguineous] [N/A:N/A] Exudate Color: [1:red, brown] [2:red, brown] [N/A:N/A] Wound Margin: [1:Distinct, outline attached] [2:Distinct, outline attached] [N/A:N/A] Granulation Amount: [1:Large (67-100%)] [2:None Present (0%)] [N/A:N/A] Granulation Quality: [1:Red] [2:N/A] [N/A:N/A] Necrotic Amount: [1:Small (1-33%)] [2:None Present (0%)] [N/A:N/A] Exposed  Structures: [1:Fascia: No Fat: No Tendon: No Muscle: No Joint: No Bone: No] [2:Fascia: No Fat: No Tendon: No Muscle: No Joint: No Bone: No] [N/A:N/A] Limited to Skin Limited to Skin Breakdown Breakdown Epithelialization: None Large (67-100%) N/A Periwound Skin Texture: Edema: Yes Edema: No N/A Excoriation: No Excoriation: No Induration: No Induration: No Callus: No Callus: No Crepitus: No Crepitus: No Fluctuance: No Fluctuance: No Friable: No Friable: No Rash: No Rash: No Scarring: No Scarring: No Periwound Skin Moist: Yes Dry/Scaly: Yes N/A Moisture: Maceration: No Maceration: No Dry/Scaly: No Moist: No Periwound Skin Color: Hemosiderin Staining: Yes Hemosiderin Staining: Yes N/A Atrophie Blanche: No Atrophie Blanche: No Cyanosis: No Cyanosis: No Ecchymosis: No Ecchymosis: No Erythema: No Erythema: No Mottled: No Mottled: No Pallor: No Pallor: No Rubor: No Rubor: No Temperature: No Abnormality No Abnormality N/A Tenderness on Yes Yes N/A Palpation: Wound Preparation: Ulcer Cleansing: Ulcer Cleansing: N/A Rinsed/Irrigated with Rinsed/Irrigated with Saline Saline Topical Anesthetic Topical Anesthetic Applied: Other: Lidocaine Applied: None 4% Ointment Treatment Notes Electronic Signature(s) Signed: 10/15/2014 4:20:29 PM By: Elliot Gurney, RN, [name], [name], [name] Entered By: Elliot Gurney, RN, [name], Kim on 10/14/2014 11:39:59 Koslow, [name] ([id number]) -------------------------------------------------------------------------------- Multi-Disciplinary Care Plan Details Patient Name: JOZI, MALACHI. Date of Service: 10/14/2014 11:00 AM Medical Record Number: [id number] Patient Account Number: [id number] Date of Birth/Sex: [birth year] (28 y.o. Female) Treating RN: [name] Primary Care Physician: Tawni Carnes Other Clinician: Referring Physician: Tawni Carnes Treating Physician/Extender: Rudene Re in Treatment: 3 Active Inactive Abuse / Safety / Falls /  Self Care Management Nursing Diagnoses: Potential for falls Goals: Patient will remain injury free Date Initiated: 09/20/2014 Goal Status: Active Interventions: Assess fall risk on admission and as needed Notes: Orientation to the Wound Care Program Nursing Diagnoses: Knowledge deficit related to the wound healing center program Goals: Patient/caregiver will verbalize understanding of the Wound Healing Center Program Date Initiated: 09/20/2014 Goal Status: Active Interventions: Provide education on orientation to the wound center Notes: Pain, Acute or Chronic Nursing Diagnoses: Pain Management - Cyclic Acute (Dressing Change Related) Goals: Patient will verbalize adequate pain control and receive pain control interventions during procedures as needed AHNA, KONKLE ([id number]) Date Initiated: 09/20/2014 Goal Status: Active Interventions: Complete pain assessment as per visit requirements Notes: Venous Leg Ulcer Nursing Diagnoses: Potential for venous Insuffiency (use before diagnosis confirmed) Goals: Non-invasive venous studies are completed as ordered Date Initiated: 09/20/2014 Goal Status: Active Interventions: Assess peripheral edema status every visit. Notes: Wound/Skin Impairment Nursing Diagnoses: Impaired tissue integrity Goals: Ulcer/skin breakdown will have a volume reduction of 30% by week 4 Date Initiated: 09/20/2014  Goal Status: Active Interventions: Assess ulceration(s) every visit Notes: Electronic Signature(s) Signed: 10/15/2014 4:20:29 PM By: Elliot GurneyWoody, RN, [name], [name], [name] Entered By: Elliot GurneyWoody, RN, [name], Kim on 10/14/2014 11:39:51 Settlemire, [name] ([id number]) -------------------------------------------------------------------------------- Pain Assessment Details Patient Name: [name], [name] Date of Service: 10/14/2014 11:00 AM Medical Record Number: [id number] Patient Account Number: [id number] Date of Birth/Sex: 03/27/1986 (28 y.o. Female) Treating RN:  Huel CoventryWoody, Kim Primary Care Physician: Tawni CarnesWIGHT, ANDREW Other Clinician: Referring Physician: Tawni CarnesWIGHT, ANDREW Treating Physician/Extender: [name], Errol Weeks in Treatment: 3 Active Problems Location of Pain Severity and Description of Pain Patient Has Paino No Site Locations Pain Management and Medication Current Pain Management: Electronic Signature(s) Signed: 10/15/2014 4:20:29 PM By: Elliot GurneyWoody, RN, [name], [name], [name] Entered By: Elliot GurneyWoody, RN, [name], Kim on 10/14/2014 11:19:04 [name], [name] ([id number]) -------------------------------------------------------------------------------- Patient/Caregiver Education Details Patient Name: [name], Sarha J. Date of Service: 10/14/2014 11:00 AM Medical Record Number: [id number] Patient Account Number: [id number] Date of Birth/Gender: 01/15/1986 (28 y.o. Female) Treating RN: Huel CoventryWoody, Kim Primary Care Physician: Tawni CarnesWIGHT, ANDREW Other Clinician: Referring Physician: Tawni CarnesWIGHT, ANDREW Treating Physician/Extender: [name], Errol Weeks in Treatment: 3 Education Assessment Education Provided To: Patient Education Topics Provided Wound/Skin Impairment: Handouts: Caring for Your Ulcer, Other: return for re-wrap if vascular does not wrap after procedure Methods: Explain/Verbal Responses: State content correctly Electronic Signature(s) Signed: 10/15/2014 4:20:29 PM By: Elliot GurneyWoody, RN, [name], [name], [name] Entered By: Elliot GurneyWoody, RN, [name], Kim on 10/14/2014 12:05:06 [name], [name] ([id number]) -------------------------------------------------------------------------------- Wound Assessment Details Patient Name: [name], [name] Date of Service: 10/14/2014 11:00 AM Medical Record Number: [id number] Patient Account Number: [id number] Date of Birth/Sex: 03/08/1986 (28 y.o. Female) Treating RN: Huel CoventryWoody, Kim Primary Care Physician: Tawni CarnesWIGHT, ANDREW Other Clinician: Referring Physician: Tawni CarnesWIGHT, ANDREW Treating Physician/Extender: [name], Errol Weeks in Treatment: 3 Wound Status Wound  Number: 1 Primary Etiology: Venous Leg Ulcer Wound Location: Right Malleolus - Lateral Wound Status: Open Wounding Event: Not Known Date Acquired: 09/06/2014 Weeks Of Treatment: 3 Clustered Wound: No Photos Photo Uploaded By: Elliot GurneyWoody, RN, [name], Kim on 10/14/2014 12:29:01 Wound Measurements Length: (cm) 1.8 Width: (cm) 2.1 Depth: (cm) 0.2 Area: (cm) 2.969 Volume: (cm) 0.594 % Reduction in Area: -35% % Reduction in Volume: 10% Epithelialization: None Wound Description Full Thickness Without Exposed Classification: Support Structures Wound Margin: Distinct, outline attached Exudate Medium Amount: Exudate Type: Serosanguineous Exudate Color: red, brown Foul Odor After Cleansing: No Wound Bed Granulation Amount: Large (67-100%) Exposed Structure Granulation Quality: Red Fascia Exposed: No Necrotic Amount: Small (1-33%) Fat Layer Exposed: No [name], [name] ([id number]) Necrotic Quality: Adherent Slough Tendon Exposed: No Muscle Exposed: No Joint Exposed: No Bone Exposed: No Limited to Skin Breakdown Periwound Skin Texture Texture Color No Abnormalities Noted: No No Abnormalities Noted: No Callus: No Atrophie Blanche: No Crepitus: No Cyanosis: No Excoriation: No Ecchymosis: No Fluctuance: No Erythema: No Friable: No Hemosiderin Staining: Yes Induration: No Mottled: No Localized Edema: Yes Pallor: No Rash: No Rubor: No Scarring: No Temperature / Pain Moisture Temperature: No Abnormality No Abnormalities Noted: No Tenderness on Palpation: Yes Dry / Scaly: No Maceration: No Moist: Yes Wound Preparation Ulcer Cleansing: Rinsed/Irrigated with Saline Topical Anesthetic Applied: Other: Lidocaine 4% Ointment , Treatment Notes Wound #1 (Right, Lateral Malleolus) 1. Cleansed with: Clean wound with Normal Saline Cleanse wound with antibacterial soap and water 2. Anesthetic Topical Lidocaine 4% cream to wound bed prior to debridement 4. Dressing  Applied: Prisma Ag 5. Secondary Dressing Applied Bordered Foam Dressing Electronic Signature(s) Signed: 10/15/2014 4:20:29 PM By: Elliot GurneyWoody, RN, [name], [name], [name]  Entered By: Elliot Gurney, RN, [name], Kim on 10/14/2014 11:34:28 [name], [name] ([id number]) -------------------------------------------------------------------------------- Wound Assessment Details Patient Name: VALRIE, JIA. Date of Service: 10/14/2014 11:00 AM Medical Record Number: [id number] Patient Account Number: [id number] Date of Birth/Sex: 10/17/1985 (28 y.o. Female) Treating RN: [name] Primary Care Physician: Tawni Carnes Other Clinician: Referring Physician: Tawni Carnes Treating Physician/Extender: Rudene Re in Treatment: 3 Wound Status Wound Number: 2 Primary Etiology: Lymphedema Wound Location: Right Lower Leg - Medial Wound Status: Healed - Epithelialized Wounding Event: Not Known Date Acquired: 09/27/2014 Weeks Of Treatment: 2 Clustered Wound: No Photos Photo Uploaded By: Elliot Gurney, RN, [name], Kim on 10/14/2014 12:29:12 Wound Measurements Length: (cm) 0 % Reduction Width: (cm) 0 % Reduction Depth: (cm) 0 Epithelializ Area: (cm) 0 Volume: (cm) 0 in Area: 100% in Volume: 100% ation: Large (67-100%) Wound Description Full Thickness Without Exposed Classification: Support Structures Wound Margin: Distinct, outline attached Exudate Small Amount: Exudate Type: Serosanguineous Exudate Color: red, brown Foul Odor After Cleansing: No Wound Bed Granulation Amount: None Present (0%) Exposed Structure Necrotic Amount: None Present (0%) Fascia Exposed: No Fat Layer Exposed: No Hands, Guillermina J. ([id number]) Tendon Exposed: No Muscle Exposed: No Joint Exposed: No Bone Exposed: No Limited to Skin Breakdown Periwound Skin Texture Texture Color No Abnormalities Noted: No No Abnormalities Noted: No Callus: No Atrophie Blanche: No Crepitus: No Cyanosis: No Excoriation: No Ecchymosis:  No Fluctuance: No Erythema: No Friable: No Hemosiderin Staining: Yes Induration: No Mottled: No Localized Edema: No Pallor: No Rash: No Rubor: No Scarring: No Temperature / Pain Moisture Temperature: No Abnormality No Abnormalities Noted: No Tenderness on Palpation: Yes Dry / Scaly: Yes Maceration: No Moist: No Wound Preparation Ulcer Cleansing: Rinsed/Irrigated with Saline Topical Anesthetic Applied: None Electronic Signature(s) Signed: 10/15/2014 4:20:29 PM By: Elliot Gurney, RN, [name], [name], [name] Entered By: Elliot Gurney, RN, [name], Kim on 10/14/2014 11:38:11 Arts, [name] ([id number]) -------------------------------------------------------------------------------- Vitals Details Patient Name: LANIE, SCHELLING Date of Service: 10/14/2014 11:00 AM Medical Record Number: [id number] Patient Account Number: [id number] Date of Birth/Sex: 09-07-1985 (28 y.o. Female) Treating RN: [name] Primary Care Physician: Tawni Carnes Other Clinician: Referring Physician: Tawni Carnes Treating Physician/Extender: Rudene Re in Treatment: 3 Vital Signs Time Taken: 11:19 Temperature (F): 98.4 Height (in): 66 Pulse (bpm): 111 Weight (lbs): 350.3 Respiratory Rate (breaths/min): 16 Body Mass Index (BMI): 56.5 Blood Pressure (mmHg): 117/64 Reference Range: 80 - 120 mg / dl Electronic Signature(s) Signed: 10/15/2014 4:20:29 PM By: Elliot Gurney, RN, [name], [name], [name] Entered By: Elliot Gurney, RN, [name], Kim on 10/14/2014 11:24:33

## 2014-10-19 ENCOUNTER — Ambulatory Visit (INDEPENDENT_AMBULATORY_CARE_PROVIDER_SITE_OTHER): Payer: BLUE CROSS/BLUE SHIELD | Admitting: Vascular Surgery

## 2014-10-19 ENCOUNTER — Encounter: Payer: Self-pay | Admitting: Vascular Surgery

## 2014-10-19 VITALS — BP 112/79 | HR 84 | Temp 98.6°F | Resp 18 | Ht 66.0 in | Wt 343.0 lb

## 2014-10-19 DIAGNOSIS — I83215 Varicose veins of right lower extremity with both ulcer other part of foot and inflammation: Secondary | ICD-10-CM

## 2014-10-19 DIAGNOSIS — I83218 Varicose veins of right lower extremity with both ulcer of other part of lower extremity and inflammation: Secondary | ICD-10-CM

## 2014-10-19 DIAGNOSIS — I83219 Varicose veins of right lower extremity with both ulcer of unspecified site and inflammation: Secondary | ICD-10-CM

## 2014-10-19 DIAGNOSIS — I83013 Varicose veins of right lower extremity with ulcer of ankle: Secondary | ICD-10-CM

## 2014-10-19 DIAGNOSIS — I83212 Varicose veins of right lower extremity with both ulcer of calf and inflammation: Secondary | ICD-10-CM | POA: Diagnosis not present

## 2014-10-19 DIAGNOSIS — L97319 Non-pressure chronic ulcer of right ankle with unspecified severity: Principal | ICD-10-CM

## 2014-10-19 DIAGNOSIS — I83213 Varicose veins of right lower extremity with both ulcer of ankle and inflammation: Secondary | ICD-10-CM

## 2014-10-19 DIAGNOSIS — I83211 Varicose veins of right lower extremity with both ulcer of thigh and inflammation: Secondary | ICD-10-CM | POA: Diagnosis not present

## 2014-10-19 DIAGNOSIS — I83214 Varicose veins of right lower extremity with both ulcer of heel and midfoot and inflammation: Secondary | ICD-10-CM | POA: Diagnosis not present

## 2014-10-19 NOTE — Progress Notes (Signed)
Patient name: [name] SimmondsShama J Manera MRN: [id number] DOB: 11/05/1985 Sex: female   Referred by: Meyer RusselBritto  Reason for referral:  Chief Complaint  Patient presents with  . New Evaluation    c/o right ankle ulcer for 6 weeks, been treated at [facility] for 4 weeks  currently wearing [employer]   . Varicose Veins    HISTORY OF PRESENT ILLNESS: Patient presents today for continued discussion of severe venous hypertension. I had seen her 3 years ago at which time she had had a bleed around her ankle from venous hypertension on the right side. She did have marked varicosities in both lower extremities with more discomfort in the right than the left. I did discuss the option of vein stripping and stab phlebectomy as treatment for venous hypertension. She had not followed up in about 3 years. One month ago she developed right lateral malleolus venous ulcer. She did not recall any specific trauma to this area. She has been treated in [facility] with the Unna boot for approximately one month. She has had no further episodes of bleeding. He does have pain associated with this as well.  Past Medical History  Diagnosis Date  . Obesity   . Varicose veins   . Ankle ulcer     Past Surgical History  Procedure Laterality Date  . Tonsillectomy      History   Social History  . Marital Status: Single    Spouse Name: N/A  . Number of Children: N/A  . Years of Education: N/A   Occupational History  . Not on file.   Social History Main Topics  . Smoking status: Never Smoker   . Smokeless tobacco: Never Used  . Alcohol Use: 0.6 oz/week    1 Glasses of wine per week     Comment: occasional  . Drug Use: Yes    Special: Marijuana  . Sexual Activity: Yes    Birth Control/ Protection: Condom   Other Topics Concern  . Not on file   Social History Narrative    Family History  Problem Relation Age of Onset  . Diabetes Mother   . Varicose Veins Mother   . Varicose  Veins Sister     Allergies as of 10/19/2014  . (No Known Allergies)    Current Outpatient Prescriptions on File Prior to Visit  Medication Sig Dispense Refill  . acetaminophen (TYLENOL) 500 MG tablet Take 500 mg by mouth every 6 (six) hours as needed (for pain.).    Marland Kitchen. cyclobenzaprine (FLEXERIL) 5 MG tablet Take 1 tablet (5 mg total) by mouth 3 (three) times daily as needed for muscle spasms. 30 tablet 0  . cyclopentolate (CYCLOGYL) 1 % ophthalmic solution Place 1 drop into the right eye 2 (two) times daily. 2 mL 0  . fluconazole (DIFLUCAN) 150 MG tablet Take 1 tablet (150 mg total) by mouth once. 1 tablet 0  . hydrochlorothiazide (HYDRODIURIL) 12.5 MG tablet Take 1 tablet (12.5 mg total) by mouth daily. 90 tablet 2  . ibuprofen (ADVIL,MOTRIN) 600 MG tablet Take 1 tablet (600 mg total) by mouth every 6 (six) hours as needed. 30 tablet 0  . metroNIDAZOLE (FLAGYL) 500 MG tablet Take 1 tablet (500 mg total) by mouth 2 (two) times daily. 14 tablet 0  . naproxen (NAPROSYN) 500 MG tablet Take 1 tablet (500 mg total) by mouth 2 (two) times daily with a meal. 30 tablet 0  . norgestimate-ethinyl estradiol (SPRINTEC 28) 0.25-35 MG-MCG  tablet Take 1 tablet by mouth daily. 1 Package 11  . prednisoLONE acetate (PRED FORTE) 1 % ophthalmic suspension Place 1 drop into the right eye 2 (two) times daily.    Marland Kitchen trimethoprim-polymyxin b (POLYTRIM) ophthalmic solution Place 1 drop into the right eye every 6 (six) hours.     No current facility-administered medications on file prior to visit.      PHYSICAL EXAMINATION:  General: The patient is a well-nourished female, in no acute distress. Morbidly obese Vital signs are BP 112/79 mmHg  Pulse 84  Temp(Src) 98.6 F (37 C) (Oral)  Resp 18  Ht  (1.676 m)  Wt 343 lb (155.584 kg)  BMI 55.39 kg/m2 Pulmonary: There is a good air exchange  Musculoskeletal: There are no major deformities.  There is no significant extremity pain. Neurologic: No focal  weakness or paresthesias are detected, Skin: Marked changes of venous hypertension bilaterally. Multiple very superficial telangiectasia around her left ankle. On the right she does have a 1 x 2 cm ulcer over her lateral malleolus with clean granulating base. Extensive hemosiderin deposits and thickening circumferentially around her right ankle. Psychiatric: The patient has normal affect. Cardiovascular: 2+ dorsalis pedis pulses bilaterally Multiple large varicosities throughout her medial thigh extensive varicosities in her posterior calf   [facility] Vascular Lab Studies:  Ordered and Independently Reviewed this revealed tortuosity and reflux in her extremely large saphenous vein throughout her proximal and mid thigh. Diameters greater than 1 cm throughout its course. Interestingly she has no evidence of deep venous reflux in her right leg. Does have some left leg venous reflux  Impression and Plan:  Progression of severe venous hypertension bilaterally right greater than left lower extremity. Now with venous ulceration which is not healing with maximal treatment with weekly Unna boot. I reimaged her veins. I do not feel she is a candidate for ablation due to the very large size and extreme tortuosity. Also with her morbid obesity weighing 340 pounds and 5 foot 6 inches in height and very difficult to do this under local anesthesia. I have recommended surgical removal of this segment of her saphenous vein from distal thigh to saphenofemoral junction and stab phlebectomy of multiple large tributary branches throughout her thigh and calf. Explained this would be done under general anesthesia as an outpatient at the hospital. She wishes to proceed as soon as possible.    [name] [facility] Office: [phone]

## 2014-10-22 ENCOUNTER — Encounter: Payer: BLUE CROSS/BLUE SHIELD | Admitting: Surgery

## 2014-10-22 DIAGNOSIS — L97312 Non-pressure chronic ulcer of right ankle with fat layer exposed: Secondary | ICD-10-CM | POA: Diagnosis not present

## 2014-10-22 NOTE — Progress Notes (Addendum)
BETHENY, SUCHECKI ([id number]) Visit Report for 10/22/2014 Chief Complaint Document Details Patient Name: [name], [name]. Date of Service: 10/22/2014 8:45 AM Medical Record Patient Account Number: [id number] Number: [name], RN, [name], Treating RN: 25-Jan-1986 (28 y.o. [place] Date of Birth/Sex: Female) Other Clinician: Primary Care Physician: KUNEFF, RENEE Treating Evlyn Kanner Referring Physician: Tawni Carnes Physician/Extender: Weeks in Treatment: 4 Information Obtained from: Patient Chief Complaint Patient presents for treatment of an open ulcer due to venous insufficiency. this 29 year old patient has had an ulcer on the lateral part of her right lower extremity and this has been there for 2 weeks. Electronic Signature(s) Signed: 10/22/2014 9:21:41 AM By: Evlyn Kanner MD, FACS Entered By: Evlyn Kanner on 10/22/2014 09:21:41 [name] ([id number]) -------------------------------------------------------------------------------- HPI Details Patient Name: [name], [name]. Date of Service: 10/22/2014 8:45 AM Medical Record Number: [id number] Patient Account Number: [id number] Date of Birth/Sex: 04-Apr-1986 (28 y.o. Female) Treating RN: Primary Care Physician: Felix Pacini Other Clinician: Referring Physician: Tawni Carnes Treating Physician/Extender: Rudene Re in Treatment: 4 History of Present Illness Location: the lateral part of her right ankle Quality: Patient reports experiencing a dull pain to affected area(s). Severity: Patient states wound are getting worse. Duration: Patient has had the wound for < 2 weeks prior to presenting for treatment Timing: Pain in wound is Intermittent (comes and goes Context: The wound appeared gradually over time Modifying Factors: Consults to this date include:seen in the ER and asked to follow up with wound care. Associated Signs and Symptoms: Patient reports having difficulty standing for long periods. HPI  Description: 29 year old patient who started with having ulcerations on the right lower leg on the lateral part of her ankle for about 2 weeks. She was seen in the ER at [facility] and advised to see the wound care for a consultation. No X-rays of workup was done during the ER visit and no prescription for any medications of compression wraps were given. the patient is not diabetic but does have hypertension and her medications have been reviewed by me. 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 The right and left greater saphenous vein was found to be tortuous. Deep venous system was also not competent and there was reflux of more than 500 ms. She was then seen by Dr. Tawanna Cooler Early who recommended that the patient would not benefit from endovenous ablation and he had recommended vein stripping on the right side and multiple small phlebectomy procedures on the left side. the patient did not follow-up due to social economic reasons. She has not been wearing any compression stockings and has not taken any specific treatment for varicose veins for the last 3 years. 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 We have obtained some appointments to see the vascular surgeons by the end of the month and the patient would like to follow up with me at my [facility] on Wednesday, June 29. 10/14/2014 -- she could not see me yesterday in [place] and hence has come for a review today. She has a vascular workup to be done this afternoon at [facility] She is doing fine otherwise. 10/22/2014 -- she was seen by Dr. [name] and he has recommended surgical removal of her right saphenous vein  from distal thigh to saphenofemoral junction  and stab phlebectomy's of multiple large tributary branches throughout her thigh and calf. This would be done under general anesthesia in the outpatient setting. Electronic Signature(s) Signed: 10/22/2014 9:21:52 AM By: Evlyn Kanner MD, FACS Previous Signature: 10/22/2014 8:53:27 AM Version By: Evlyn Kanner MD, FACS [name] ([id number]) Entered By: Evlyn Kanner on 10/22/2014 09:21:52 [name], [name] ([id number]) -------------------------------------------------------------------------------- Physical Exam Details Patient Name: [name], [name]. Date of Service: 10/22/2014 8:45 AM Medical Record Patient Account Number: [id number] Number: [name], RN, [name], Treating RN: 1986/03/08 (28 y.o. [place] Date of Birth/Sex: Female) Other Clinician: Primary Care Physician: KUNEFF, RENEE Treating Evlyn Kanner Referring Physician: Tawni Carnes Physician/Extender: Weeks in Treatment: 4 Constitutional . Pulse regular. Respirations normal and unlabored. Afebrile. . Eyes Nonicteric. Reactive to light. Ears, Nose, Mouth, and Throat Lips, teeth, and gums WNL.Marland Kitchen Moist mucosa without lesions . Neck supple and nontender. No palpable supraclavicular or cervical adenopathy. Normal sized without goiter. Respiratory WNL. No retractions.. Cardiovascular Pedal Pulses WNL. No clubbing, cyanosis or edema. Lymphatic No adneopathy. No adenopathy. No adenopathy. Musculoskeletal Adexa without tenderness or enlargement.. Digits and nails w/o clubbing, cyanosis, infection, petechiae, ischemia, or inflammatory conditions.. Integumentary (Hair, Skin) the wound on her right lateral ankle looks very clean and good healthy granulation tissue. No debridement is required today.Marland Kitchen No crepitus or fluctuance. No peri-wound warmth or erythema. No masses.Marland Kitchen Psychiatric Judgement and insight Intact.. No evidence of depression, anxiety, or agitation.. Electronic Signature(s) Signed: 10/22/2014 9:22:20 AM By:  Evlyn Kanner MD, FACS Entered By: Evlyn Kanner on 10/22/2014 09:22:20 [name] Simmonds ([id number]) -------------------------------------------------------------------------------- Physician Orders Details Patient Name: [name], [name] Date of Service: 10/22/2014 8:45 AM Medical Record Patient Account Number: [id number] Number: [name], RN, [name], Treating RN: Jan 31, 1986 (28 y.o. [place] Date of Birth/Sex: Female) Other Clinician: Primary Care Physician: KUNEFF, RENEE Treating Evlyn Kanner Referring Physician: Tawni Carnes Physician/Extender: Tania Ade in Treatment: 4 Verbal / Phone Orders: Yes Clinician: [name], RN, [name], Rita Read Back and Verified: Yes Diagnosis Coding Wound Cleansing Wound #1 Right,Lateral Malleolus o May shower with protection. o No tub bath. Anesthetic Wound #1 Right,Lateral Malleolus o Topical Lidocaine 4% cream applied to wound bed prior to debridement Skin Barriers/Peri-Wound Care Wound #1 Right,Lateral Malleolus o Barrier cream Primary Wound Dressing Wound #1 Right,Lateral Malleolus o Prisma Ag Secondary Dressing Wound #1 Right,Lateral Malleolus o Dry Gauze Dressing Change Frequency Wound #1 Right,Lateral Malleolus o Change dressing every week Follow-up Appointments Wound #1 Right,Lateral Malleolus o Return Appointment in 1 week. Edema Control Wound #1 Right,Lateral Malleolus o [employer] to Right Lower Extremity ERICIA, MOXLEY ([id number]) Notes Insurance verification for Apligraf Electronic Signature(s) Signed: 10/22/2014 12:35:53 PM By: Evlyn Kanner MD, FACS Signed: 10/22/2014 4:42:20 PM By: Elpidio Eric [name], RN Entered By: Elpidio Eric on 10/22/2014 09:31:33 Burdette, [name] ([id number]) -------------------------------------------------------------------------------- Problem List Details Patient Name: [name], [name]. Date of Service: 10/22/2014 8:45 AM Medical Record Patient Account Number:  [id number] Number: [name], RN, [name], Treating RN: July 08, 1985 (28 y.o. 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 Date of Service: 10/22/2014 8:45 AM Medical Record Patient Account Number: [id number] Number: [name], RN, [name], Treating RN: 1985/08/04 (28 y.o. [place] Date of Birth/Sex: Female) Other Clinician: Primary Care Physician: KUNEFF, RENEE Treating Evlyn Kanner Referring Physician: Tawni Carnes Physician/Extender: Tania Ade in Treatment: 4 Subjective Chief Complaint Information obtained from Patient Patient presents for treatment of an open ulcer due to venous insufficiency. this 29 year old patient has had an ulcer on the lateral part of her right lower extremity and this has been there for 2 weeks. History of Present Illness (HPI) The following HPI elements were documented for the patient's wound: Location: the lateral part of her right ankle Quality: Patient reports experiencing a dull pain to affected area(s). Severity: Patient states wound are getting worse. Duration: Patient has had the wound for < 2 weeks prior to presenting for treatment Timing: Pain in wound is Intermittent  (comes and goes Context: The wound appeared gradually over time Modifying Factors: Consults to this date include:seen in the ER and asked to follow up with wound care. Associated Signs and Symptoms: Patient reports having difficulty standing for long periods. 29 year old patient who started with having ulcerations on the right lower leg on the lateral part of her ankle for about 2 weeks. She was seen in the ER at [facility] and advised to see the wound care for a consultation. No X-rays of workup was done during the ER visit and no prescription for any medications of compression wraps were given. the patient is not diabetic but does have hypertension and her medications have been reviewed by me. 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 The right and left greater saphenous vein was found to be tortuous. Deep venous system was also not competent and there was reflux of more than 500 ms. She was then seen by Dr. Tawanna Cooler Early who recommended that the patient would not benefit from endovenous ablation and he had recommended vein stripping on the right side and multiple small phlebectomy procedures on the left side. the patient did not follow-up due to social economic reasons. She has not been wearing any compression stockings and has not taken any specific treatment for varicose veins for the last 3 years. 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 We have obtained some appointments to see the vascular surgeons by the end of the month and the patient would like to follow up with me at my [facility] on Wednesday, June 29. [name], [name] ([id number]) 10/14/2014 -- she could not see me yesterday in [place] and hence has  come for a review today. She has a vascular workup to be done this afternoon at [facility]. She is doing fine otherwise. 10/22/2014 -- she was seen by Dr. [name] and he has recommended surgical removal of her right saphenous vein from distal thigh to saphenofemoral junction and stab phlebectomy's of multiple large tributary branches throughout her thigh and calf. This would be done under general anesthesia in the outpatient setting. Objective Constitutional Pulse regular. Respirations normal and unlabored. Afebrile. Vitals Time Taken: 9:01 AM, Height: 66 in, Weight: 350.3 lbs, BMI: 56.5, Temperature: 98.5  F, Pulse: 76 bpm, Respiratory Rate: 16 breaths/min, Blood Pressure: 103/66 mmHg. Eyes Nonicteric. Reactive to light. Ears, Nose, Mouth, and Throat Lips, teeth, and gums WNL.Marland Kitchen Moist mucosa without lesions . Neck supple and nontender. No palpable supraclavicular or cervical adenopathy. Normal sized without goiter. Respiratory WNL. No retractions.. Cardiovascular Pedal Pulses WNL. No clubbing, cyanosis or edema. Lymphatic No adneopathy. No adenopathy. No adenopathy. Musculoskeletal Adexa without tenderness or enlargement.. Digits and nails w/o clubbing, cyanosis, infection, petechiae, ischemia, or inflammatory conditions.Marland Kitchen Psychiatric Judgement and insight Intact.. No evidence of depression, anxiety, or agitation.. Integumentary (Hair, Skin) [name], [name]. ([id number]) the wound on her right lateral ankle looks very clean and good healthy granulation tissue. No debridement is required today.Marland Kitchen No crepitus or fluctuance. No peri-wound warmth or erythema. No masses.. Wound #1 status is Open. Original cause of wound was Not Known. The wound is located on the Right,Lateral Malleolus. The wound measures 1.1cm length x 2cm width x 0.1cm depth; 1.728cm^2 area and 0.173cm^3 volume. The wound is limited to skin breakdown. There is no tunneling or undermining noted. There is a medium  amount of serosanguineous drainage noted. The wound margin is distinct with the outline attached to the wound base. There is large (67-100%) red granulation within the wound bed. There is no necrotic tissue within the wound bed. The periwound skin appearance exhibited: Localized Edema, Moist, Hemosiderin Staining. The periwound skin appearance did not exhibit: Callus, Crepitus, Excoriation, Fluctuance, Friable, Induration, Rash, Scarring, Dry/Scaly, Maceration, Atrophie Blanche, Cyanosis, Ecchymosis, Mottled, Pallor, Rubor, Erythema. Periwound temperature was noted as No Abnormality. The periwound has tenderness on palpation. 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 She is now ready for Apligraf and I will try and obtain this through her insurance company. She is also being scheduled for surgery and this date is pending. At some stage she is going to need compression stockings of the 20-30 mmHg variety and due to her size she may have to have these custom-made. We will start working on this. She will continue to see me in the [facility] wound center as her appointments at [facility] are backed up. Plan Wound Cleansing: Wound #1 Right,Lateral Malleolus: May shower with protection. No tub bath. Anesthetic: Wound #1 Right,Lateral Malleolus: [name], [name] ([id number]) Topical Lidocaine 4% cream applied to wound bed prior to debridement Skin Barriers/Peri-Wound Care: Wound #1 Right,Lateral Malleolus: Barrier cream Primary Wound Dressing: Wound #1 Right,Lateral Malleolus: Prisma Ag Secondary Dressing: Wound #1 Right,Lateral Malleolus: Dry Gauze Dressing Change Frequency: Wound #1 Right,Lateral Malleolus: Change dressing every  week Follow-up Appointments: Wound #1 Right,Lateral Malleolus: Return Appointment in 1 week. Edema Control: Wound #1 Right,Lateral Malleolus: Unna Boot to Right Lower Extremity General Notes: Insurance verification for Apligraf Today we will continue with Prisma AG and Unna's boot. She is now ready for Apligraf and I will try and obtain this through her insurance company. She is also being scheduled for surgery and this date is pending. At some stage she is going to need compression stockings of the 20-30 mmHg variety and due to her size she may have to have these custom-made. We will start working on this. She will continue to see me in the [facility] wound center as her appointments at [facility] are backed up. Electronic Signature(s) Signed: 10/22/2014 11:55:05 AM  By: Elpidio EricAfful, Rita [name], RN Signed: 10/22/2014 12:35:53 PM By: Evlyn KannerBritto, Tenia Goh MD, FACS Previous Signature: 10/22/2014 9:25:15 AM Version By: Evlyn KannerBritto, Maelin Kurkowski MD, FACS Previous Signature: 10/22/2014 9:23:36 AM Version By: Evlyn KannerBritto, Shanti Eichel MD, FACS Entered By: Elpidio EricAfful, Rita on 10/22/2014 11:55:05 [name], [name]. ([id number]) -------------------------------------------------------------------------------- SuperBill Details Patient Name: [name], [name]. Date of Service: 10/22/2014 Medical Record Patient Account Number: [id number] Number: [name], RN, [name], Treating RN: 07/28/1985 (28 y.o. 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 E66.01 Morbid (severe) obesity due to excess calories Facility Procedures CPT4: Description Modifier Quantity Code [id number] 99212 - WOUND CARE VISIT-LEV 2 EST PT 1 CPT4:  [id number] (Facility Use Only) [id number] - APPLY MULTLAY COMPRS LWR RT 1 LEG Physician Procedures CPT4 Code Description: [id number] 99213 - WC PHYS LEVEL 3 - EST PT ICD-10 Description Diagnosis I83.013 Varicose veins of right lower extremity with ulcer L97.312 Non-pressure chronic ulcer of right ankle with fat E66.01 Morbid (severe) obesity due to  excess calories Modifier: of ankle layer exposed Quantity: 1 Electronic Signature(s) Signed: 10/22/2014 12:35:53 PM By: Evlyn KannerBritto, Aaryav Hopfensperger MD, FACS Signed: 10/22/2014 4:42:20 PM By: Elpidio EricAfful, Rita [name], RN Previous Signature: 10/22/2014 9:23:53 AM Version By: Evlyn KannerBritto, Fernie Grimm MD, FACS Entered By: Elpidio EricAfful, Rita on 10/22/2014 32:44:0109:32:28

## 2014-10-23 NOTE — Progress Notes (Signed)
[name] ([id number]) Visit Report for 10/22/2014 Arrival Information Details Patient Name: [name], [name]. Date of Service: 10/22/2014 8:45 AM Medical Record Number: [id number] Patient Account Number: [id number] Date of Birth/Sex: 1985/11/25 (28 y.o. Female) Treating [name]: [name], [name] Primary Care Physician: [name] Other Clinician: Referring Physician: Tawni [name] Treating Physician/Extender: [name] in Treatment: 4 Visit Information History Since Last Visit Any new allergies or adverse reactions: No Patient Arrived: Ambulatory Had a fall or experienced change in No Arrival Time: 08:59 activities of daily living that may affect Accompanied By: self risk of falls: Transfer Assistance: None Signs or symptoms of abuse/neglect since last No Patient Identification Verified: Yes visito Secondary Verification Process Yes Hospitalized since last visit: No Completed: Has Dressing in Place as Prescribed: Yes Patient Has Alerts: Yes Has Compression in Place as Prescribed: Yes Patient Alerts: 09/2013 ABI R: Pain Present Now: No 1.3 Electronic Signature(s) Signed: 10/22/2014 4:42:20 PM By: [name] Entered By: [name] on 10/22/2014 09:01:39 [name], [name] ([id number]) -------------------------------------------------------------------------------- Clinic Level of Care Assessment Details Patient Name: [name], [name] Date of Service: 10/22/2014 8:45 AM Medical Record Number: [id number] Patient Account Number: [id number] Date of Birth/Sex: Sep 03, 1985 (28 y.o. Female) Treating [name]: [name], [name] Primary Care Physician: [name] Other Clinician: Referring Physician: Tawni [name] Treating Physician/Extender: [name] in Treatment: 4 Clinic Level of Care Assessment Items TOOL 4 Quantity Score []  - Use when only an EandM is performed on FOLLOW-UP visit 0 ASSESSMENTS - Nursing Assessment / Reassessment X -  Reassessment of Co-morbidities (includes updates in patient status) 1 10 X - Reassessment of Adherence to Treatment Plan 1 5 ASSESSMENTS - Wound and Skin Assessment / Reassessment X - Simple Wound Assessment / Reassessment - one wound 1 5 []  - Complex Wound Assessment / Reassessment - multiple wounds 0 []  - Dermatologic / Skin Assessment (not related to wound area) 0 ASSESSMENTS - Focused Assessment []  - Circumferential Edema Measurements - multi extremities 0 []  - Nutritional Assessment / Counseling / Intervention 0 X - Lower Extremity Assessment (monofilament, tuning fork, pulses) 1 5 []  - Peripheral Arterial Disease Assessment (using hand held doppler) 0 ASSESSMENTS - Ostomy and/or Continence Assessment and Care []  - Incontinence Assessment and Management 0 []  - Ostomy Care Assessment and Management (repouching, etc.) 0 PROCESS - Coordination of Care X - Simple Patient / Family Education for ongoing care 1 15 []  - Complex (extensive) Patient / Family Education for ongoing care 0 []  - Staff obtains Chiropractor, Records, Test Results / Process Orders 0 []  - Staff telephones HHA, Nursing Homes / Clarify orders / etc 0 []  - Routine Transfer to another Facility (non-emergent condition) 0 [name], [name] ([id number]) []  - Routine Hospital Admission (non-emergent condition) 0 []  - New Admissions / Manufacturing engineer / Ordering NPWT, Apligraf, etc. 0 []  - Emergency Hospital Admission (emergent condition) 0 []  - Simple Discharge Coordination 0 []  - Complex (extensive) Discharge Coordination 0 PROCESS - Special Needs []  - Pediatric / Minor Patient Management 0 []  - Isolation Patient Management 0 []  - Hearing / Language / Visual special needs 0 []  - Assessment of Community assistance (transportation, D/C planning, etc.) 0 []  - Additional assistance / Altered mentation 0 []  - Support Surface(s) Assessment (bed, cushion, seat, etc.) 0 INTERVENTIONS - Wound Cleansing / Measurement X -  Simple Wound Cleansing - one wound 1 5 []  - Complex Wound Cleansing - multiple wounds 0 X - Wound Imaging (photographs - any  number of wounds) 1 5 []  - Wound Tracing (instead of photographs) 0 []  - Simple Wound Measurement - one wound 0 []  - Complex Wound Measurement - multiple wounds 0 INTERVENTIONS - Wound Dressings X - Small Wound Dressing one or multiple wounds 1 10 []  - Medium Wound Dressing one or multiple wounds 0 []  - Large Wound Dressing one or multiple wounds 0 []  - Application of Medications - topical 0 []  - Application of Medications - injection 0 INTERVENTIONS - Miscellaneous []  - External ear exam 0 [name], [name] ([id number]) []  - Specimen Collection (cultures, biopsies, blood, body fluids, etc.) 0 []  - Specimen(s) / Culture(s) sent or taken to Lab for analysis 0 []  - Patient Transfer (multiple staff / Michiel Sites Lift / Similar devices) 0 []  - Simple Staple / Suture removal (25 or less) 0 []  - Complex Staple / Suture removal (26 or more) 0 []  - Hypo / Hyperglycemic Management (close monitor of Blood Glucose) 0 []  - Ankle / Brachial Index (ABI) - do not check if billed separately 0 X - Vital Signs 1 5 Has the patient been seen at the hospital within the last three years: Yes Total Score: 65 Level Of Care: New/Established - Level 2 Electronic Signature(s) Signed: 10/22/2014 4:42:20 PM By: [name] Entered By: [name] on 10/22/2014 09:32:02 [name], [name] ([id number]) -------------------------------------------------------------------------------- Encounter Discharge Information Details Patient Name: [name]. Date of Service: 10/22/2014 8:45 AM Medical Record Number: [id number] Patient Account Number: [id number] Date of Birth/Sex: [birth year] (28 y.o. Female) Treating [name]: [name], [place] [name] Primary Care Physician: [name] Other Clinician: Referring Physician: Tawni [name] Treating Physician/Extender: [name] in Treatment:  4 Encounter Discharge Information Items Discharge Pain Level: 0 Discharge Condition: Stable Ambulatory Status: Ambulatory Discharge Destination: Home Private Transportation: Auto Accompanied By: self Schedule Follow-up Appointment: No Medication Reconciliation completed and No provided to Patient/Care Kariss Longmire: [name] Summary of Care: Electronic Signature(s) Signed: 10/22/2014 4:42:20 PM By: [name] Entered By: [name] on 10/22/2014 09:33:49 Blish, [name] ([id number]) -------------------------------------------------------------------------------- Lower Extremity Assessment Details Patient Name: MARIANGELA, HELDT Date of Service: 10/22/2014 8:45 AM Medical Record Number: [id number] Patient Account Number: [id number] Date of Birth/Sex: Jan 24, 1986 (28 y.o. Female) Treating [name]: [name], [place] [name] Primary Care Physician: [name] Other Clinician: Referring Physician: Tawni [name] Treating Physician/Extender: [name] in Treatment: 4 Edema Assessment Assessed: [Left: No] [Right: Yes] E[Left: dema] [Right: :] Calf Left: Right: Point of Measurement: 34 cm From Medial Instep cm 54.5 cm Ankle Left: Right: Point of Measurement: 10 cm From Medial Instep cm 28.7 cm Vascular Assessment Claudication: Claudication Assessment [Right:None] Pulses: Posterior Tibial Dorsalis Pedis Palpable: [Right:Yes] Extremity colors, hair growth, and conditions: Extremity Color: [Right:Mottled] Hair Growth on Extremity: [Right:Yes] Temperature of Extremity: [Right:Warm] Capillary Refill: [Right:< 3 seconds] Dependent Rubor: [Right:No] Blanched when Elevated: [Right:No] Lipodermatosclerosis: [Right:No] Toe Nail Assessment Left: Right: Thick: Yes Discolored: Yes Deformed: No Improper Length and Hygiene: Yes KEAIRRA, BARDON ([id number]) Electronic Signature(s) Signed: 10/22/2014 4:42:20 PM By: [name] Entered By: [name] on 10/22/2014  09:09:52 Welshans, [name] ([id number]) -------------------------------------------------------------------------------- Multi Wound Chart Details Patient Name: HAZELGRACE, BONHAM Date of Service: 10/22/2014 8:45 AM Medical Record Number: [id number] Patient Account Number: [id number] Date of Birth/Sex: [birth year] (28 y.o. Female) Treating [name]: Clover Mealy, [name], [name], [place] [name] Primary Care Physician: [name] Other Clinician: Referring Physician: Tawni [name] Treating Physician/Extender: [name] in Treatment: 4 Vital Signs Height(in): 66 Pulse(bpm): 76 Weight(lbs): 350.3 Blood  Pressure 103/66 (mmHg): Body Mass Index(BMI): 57 Temperature(F): 98.5 Respiratory Rate 16 (breaths/min): Photos: [1:No Photos] [N/A:N/A] Wound Location: [1:Right Malleolus - Lateral] [N/A:N/A] Wounding Event: [1:Not Known] [N/A:N/A] Primary Etiology: [1:Venous Leg Ulcer] [N/A:N/A] Date Acquired: [1:09/06/2014] [N/A:N/A] Weeks of Treatment: [1:4] [N/A:N/A] Wound Status: [1:Open] [N/A:N/A] Measurements L x W x D 1.1x2x0.1 [N/A:N/A] (cm) Area (cm) : [1:1.728] [N/A:N/A] Volume (cm) : [1:0.173] [N/A:N/A] % Reduction in Area: [1:21.40%] [N/A:N/A] % Reduction in Volume: 73.80% [N/A:N/A] Classification: [1:Full Thickness Without Exposed Support Structures] [N/A:N/A] Exudate Amount: [1:Medium] [N/A:N/A] Exudate Type: [1:Serosanguineous] [N/A:N/A] Exudate Color: [1:red, brown] [N/A:N/A] Wound Margin: [1:Distinct, outline attached] [N/A:N/A] Granulation Amount: [1:Large (67-100%)] [N/A:N/A] Granulation Quality: [1:Red] [N/A:N/A] Necrotic Amount: [1:None Present (0%)] [N/A:N/A] Exposed Structures: [1:Fascia: No Fat: No Tendon: No Muscle: No Joint: No Bone: No] [N/A:N/A] Limited to Skin Breakdown Epithelialization: Small (1-33%) N/A N/A Periwound Skin Texture: Edema: Yes N/A N/A Excoriation: No Induration: No Callus: No Crepitus: No Fluctuance: No Friable: No Rash: No Scarring: No Periwound  Skin Moist: Yes N/A N/A Moisture: Maceration: No Dry/Scaly: No Periwound Skin Color: Hemosiderin Staining: Yes N/A N/A Atrophie Blanche: No Cyanosis: No Ecchymosis: No Erythema: No Mottled: No Pallor: No Rubor: No Temperature: No Abnormality N/A N/A Tenderness on Yes N/A N/A Palpation: Wound Preparation: Ulcer Cleansing: N/A N/A Rinsed/Irrigated with Saline Topical Anesthetic Applied: Other: Lidocaine 4% Ointment Treatment Notes Electronic Signature(s) Signed: 10/22/2014 4:42:20 PM By: [name] EricAfful, Rita [name], [name] Entered By: [name] EricAfful, Rita on 10/22/2014 09:15:05 Donnan, [name] ([id number]) -------------------------------------------------------------------------------- Multi-Disciplinary Care Plan Details Patient Name: Conley SimmondsSUMMERS, [name] Date of Service: 10/22/2014 8:45 AM Medical Record Number: [id number] Patient Account Number: [id number] Date of Birth/Sex: 03/21/1986 (28 y.o. Female) Treating [name]: [name], [place] Sinkita Primary Care Physician: [name] PaciniKUNEFF, RENEE Other Clinician: Referring Physician: Tawni CarnesWIGHT, ANDREW Treating Physician/Extender: [name] ReBritto, Errol Weeks in Treatment: 4 Active Inactive Abuse / Safety / Falls / Self Care Management Nursing Diagnoses: Potential for falls Goals: Patient will remain injury free Date Initiated: 09/20/2014 Goal Status: Active Interventions: Assess fall risk on admission and as needed Notes: Orientation to the Wound Care Program Nursing Diagnoses: Knowledge deficit related to the wound healing center program Goals: Patient/caregiver will verbalize understanding of the Wound Healing Center Program Date Initiated: 09/20/2014 Goal Status: Active Interventions: Provide education on orientation to the wound center Notes: Pain, Acute or Chronic Nursing Diagnoses: Pain Management - Cyclic Acute (Dressing Change Related) Goals: Patient will verbalize adequate pain control and receive pain control interventions during procedures as needed Conley SimmondsSUMMERS,  [name] ([id number]) Date Initiated: 09/20/2014 Goal Status: Active Interventions: Complete pain assessment as per visit requirements Notes: Venous Leg Ulcer Nursing Diagnoses: Potential for venous Insuffiency (use before diagnosis confirmed) Goals: Non-invasive venous studies are completed as ordered Date Initiated: 09/20/2014 Goal Status: Active Interventions: Assess peripheral edema status every visit. Notes: Wound/Skin Impairment Nursing Diagnoses: Impaired tissue integrity Goals: Ulcer/skin breakdown will have a volume reduction of 30% by week 4 Date Initiated: 09/20/2014 Goal Status: Active Interventions: Assess ulceration(s) every visit Notes: Electronic Signature(s) Signed: 10/22/2014 4:42:20 PM By: [name] EricAfful, Rita [name], [name] Entered By: [name] EricAfful, Rita on 10/22/2014 09:14:53 Tadlock, [name] ([id number]) -------------------------------------------------------------------------------- Pain Assessment Details Patient Name: Conley SimmondsSUMMERS, [name] Date of Service: 10/22/2014 8:45 AM Medical Record Number: [id number] Patient Account Number: [id number] Date of Birth/Sex: 11/30/1985 (28 y.o. Female) Treating [name]: Clover MealyAfful, [name], [name], [place] Sinkita Primary Care Physician: [name] PaciniKUNEFF, RENEE Other Clinician: Referring Physician: Tawni CarnesWIGHT, ANDREW Treating Physician/Extender: [name] ReBritto, Errol Weeks in Treatment: 4 Active Problems Location of Pain Severity and Description of Pain Patient Has Paino Yes  Site Locations Pain Location: Pain in Ulcers Rate the pain. Current Pain Level: 4 Character of Pain Describe the Pain: Aching, Tender Pain Management and Medication Current Pain Management: How does your pain impact your activities of daily livingo Sleep: Yes Bathing: Yes Appetite: Yes Relationship With Others: Yes Bladder Continence: Yes Emotions: Yes Bowel Continence: Yes Work: Yes Toileting: Yes Drive: Yes Dressing: Yes Hobbies: Yes Electronic Signature(s) Signed: 10/22/2014 4:42:20 PM By: [name] [name],  [name] Entered By: [name] on 10/22/2014 09:01:59 Peterka, [name] ([id number]) -------------------------------------------------------------------------------- Patient/Caregiver Education Details Patient Name: [name], [name] Date of Service: 10/22/2014 8:45 AM Medical Record Number: [id number] Patient Account Number: [id number] Date of Birth/Gender: [birth year] (28 y.o. Female) Treating [name]: Clover Mealy, [name], [name], [place] [name] Primary Care Physician: [name] Other Clinician: Referring Physician: Tawni [name] Treating Physician/Extender: [name] in Treatment: 4 Education Assessment Education Provided To: Patient Education Topics Provided Venous: Methods: Explain/Verbal Responses: State content correctly Welcome To The Wound Care Center: Methods: Explain/Verbal Responses: State content correctly Wound/Skin Impairment: Methods: Explain/Verbal Responses: State content correctly Electronic Signature(s) Signed: 10/22/2014 4:42:20 PM By: [name] Entered By: [name] on 10/22/2014 09:34:06 Woolf, [name] ([id number]) -------------------------------------------------------------------------------- Wound Assessment Details Patient Name: [name], [name]. Date of Service: 10/22/2014 8:45 AM Medical Record Number: [id number] Patient Account Number: [id number] Date of Birth/Sex: [birth year] (28 y.o. Female) Treating [name]: [name], [name] Primary Care Physician: [name] Other Clinician: Referring Physician: Tawni [name] Treating Physician/Extender: [name] in Treatment: 4 Wound Status Wound Number: 1 Primary Etiology: Venous Leg Ulcer Wound Location: Right Malleolus - Lateral Wound Status: Open Wounding Event: Not Known Date Acquired: 09/06/2014 Weeks Of Treatment: 4 Clustered Wound: No Photos Photo Uploaded By: [name] on 10/22/2014 16:38:06 Wound Measurements Length: (cm) 1.1 Width: (cm) 2 Depth: (cm) 0.1 Area: (cm) 1.728 Volume:  (cm) 0.173 % Reduction in Area: 21.4% % Reduction in Volume: 73.8% Epithelialization: Small (1-33%) Tunneling: No Undermining: No Wound Description Full Thickness Without Exposed Classification: Support Structures Wound Margin: Distinct, outline attached Exudate Medium Amount: Exudate Type: Serosanguineous Exudate Color: red, brown Foul Odor After Cleansing: No Wound Bed Granulation Amount: Large (67-100%) Exposed Structure Granulation Quality: Red Fascia Exposed: No Necrotic Amount: None Present (0%) Fat Layer Exposed: No Raczkowski, [name] ([id number]) Tendon Exposed: No Muscle Exposed: No Joint Exposed: No Bone Exposed: No Limited to Skin Breakdown Periwound Skin Texture Texture Color No Abnormalities Noted: No No Abnormalities Noted: No Callus: No Atrophie Blanche: No Crepitus: No Cyanosis: No Excoriation: No Ecchymosis: No Fluctuance: No Erythema: No Friable: No Hemosiderin Staining: Yes Induration: No Mottled: No Localized Edema: Yes Pallor: No Rash: No Rubor: No Scarring: No Temperature / Pain Moisture Temperature: No Abnormality No Abnormalities Noted: No Tenderness on Palpation: Yes Dry / Scaly: No Maceration: No Moist: Yes Wound Preparation Ulcer Cleansing: Rinsed/Irrigated with Saline Topical Anesthetic Applied: Other: Lidocaine 4% Ointment , Treatment Notes Wound #1 (Right, Lateral Malleolus) 1. Cleansed with: Cleanse wound with antibacterial soap and water 3. Peri-wound Care: Barrier cream 4. Dressing Applied: Prisma Ag 5. Secondary Dressing Applied Dry Gauze 7. Secured with Henriette Combs to Right Lower Extremity Electronic Signature(s) Signed: 10/22/2014 4:42:20 PM By: [name] Entered By: [name] on 10/22/2014 09:11:41 Rady, [name] ([id number]) -------------------------------------------------------------------------------- Vitals Details Patient Name: [name], [name] Date of Service: 10/22/2014 8:45  AM Medical Record Number: [id number] Patient Account Number: [id number] Date of Birth/Sex: 07/01/1985 (28 y.o. Female) Treating [name]: [name], [place] [name] Primary Care Physician: Claiborne Billings, Luster Landsberg  Other Clinician: Referring Physician: Tawni [name] Treating Physician/Extender: [name] in Treatment: 4 Vital Signs Time Taken: 09:01 Temperature (F): 98.5 Height (in): 66 Pulse (bpm): 76 Weight (lbs): 350.3 Respiratory Rate (breaths/min): 16 Body Mass Index (BMI): 56.5 Blood Pressure (mmHg): 103/66 Reference Range: 80 - 120 mg / dl Electronic Signature(s) Signed: 10/22/2014 4:42:20 PM By: [name] Entered By: [name] on 10/22/2014 09:03:16

## 2014-10-26 ENCOUNTER — Other Ambulatory Visit: Payer: Self-pay

## 2014-10-29 ENCOUNTER — Encounter: Payer: BLUE CROSS/BLUE SHIELD | Admitting: Surgery

## 2014-10-29 DIAGNOSIS — L97312 Non-pressure chronic ulcer of right ankle with fat layer exposed: Secondary | ICD-10-CM | POA: Diagnosis not present

## 2014-10-29 NOTE — Progress Notes (Addendum)
SIGNA, CHEEK ([id number]) Visit Report for 10/29/2014 Chief Complaint Document Details Patient Name: [name], [name]. Date of Service: 10/29/2014 8:45 AM Medical Record Number: [id number] Patient Account Number: [id number] Date of Birth/Sex: Apr 08, 1986 (28 y.o. Female) Treating RN: Primary Care Physician: KUNEFF, RENEE Other Clinician: Referring Physician: Claiborne Billings, RENEE Treating Physician/Extender: Rudene Re in Treatment: 5 Information Obtained from: Patient Chief Complaint Patient presents for treatment of an open ulcer due to venous insufficiency. this 29 year old patient has had an ulcer on the lateral part of her right lower extremity and this has been there for 2 weeks. Electronic Signature(s) Signed: 10/29/2014 9:41:42 AM By: Evlyn Kanner MD, FACS Entered By: Evlyn Kanner on 10/29/2014 09:41:42 [name] ([id number]) -------------------------------------------------------------------------------- HPI Details Patient Name: [name], [name]. Date of Service: 10/29/2014 8:45 AM Medical Record Number: [id number] Patient Account Number: [id number] Date of Birth/Sex: 1986/02/17 (28 y.o. Female) Treating RN: Primary Care Physician: KUNEFF, RENEE Other Clinician: Referring Physician: KUNEFF, RENEE Treating Physician/Extender: Rudene Re in Treatment: 5 History of Present Illness Location: the lateral part of her right ankle Quality: Patient reports experiencing a dull pain to affected area(s). Severity: Patient states wound are getting worse. Duration: Patient has had the wound for < 2 weeks prior to presenting for treatment Timing: Pain in wound is Intermittent (comes and goes Context: The wound appeared gradually over time Modifying Factors: Consults to this date include:seen in the ER and asked to follow up with wound care. Associated Signs and Symptoms: Patient reports having difficulty standing for long periods. HPI Description: 29 year old  patient who started with having ulcerations on the right lower leg on the lateral part of her ankle for about 2 weeks. She was seen in the ER at [facility] and advised to see the wound care for a consultation. No X-rays of workup was done during the ER visit and no prescription for any medications of compression wraps were given. the patient is not diabetic but does have hypertension and her medications have been reviewed by me. 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 The right and left greater saphenous vein was found to be tortuous. Deep venous system was also not competent and there was reflux of more than 500 ms. She was then seen by Dr. Tawanna Cooler Early who recommended that the patient would not benefit from endovenous ablation and he had recommended vein stripping on the right side and multiple small phlebectomy procedures on the left side. the patient did not follow-up due to social economic reasons. She has not been wearing any compression stockings and has not taken any specific treatment for varicose veins for the last 3 years. 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 We have obtained some appointments to see the vascular surgeons by the end of the month and the patient would like to follow up with me at my [facility] on Wednesday, June 29. 10/14/2014 -- she could not see me yesterday in [place] and hence has come for a review today. She has a vascular workup to be done this afternoon at [facility]. She is doing fine otherwise. 10/22/2014 -- she was seen by Dr. [name] and he has recommended surgical removal of her right saphenous vein from distal thigh to  saphenofemoral junction and stab phlebectomy's of  multiple large tributary branches throughout her thigh and calf. This would be done under general anesthesia in the outpatient setting. 10/29/2014 -- she is trying to work on a surgical date and in the meanwhile we have got insurance clearance for Apligraf and we will start this next week. [name] ([id number]) Electronic Signature(s) Signed: 10/29/2014 9:42:17 AM By: Evlyn Kanner MD, FACS Entered By: Evlyn Kanner on 10/29/2014 09:42:16 [name] ([id number]) -------------------------------------------------------------------------------- Physical Exam Details Patient Name: [name], [name]. Date of Service: 10/29/2014 8:45 AM Medical Record Number: [id number] Patient Account Number: [id number] Date of Birth/Sex: 21-Mar-1986 (28 y.o. Female) Treating RN: Primary Care Physician: KUNEFF, RENEE Other Clinician: Referring Physician: KUNEFF, RENEE Treating Physician/Extender: Rudene Re in Treatment: 5 Constitutional . Pulse regular. Respirations normal and unlabored. Afebrile. . Eyes Nonicteric. Reactive to light. Ears, Nose, Mouth, and Throat Lips, teeth, and gums WNL.Marland Kitchen Moist mucosa without lesions . Neck supple and nontender. No palpable supraclavicular or cervical adenopathy. Normal sized without goiter. Respiratory WNL. No retractions.. Cardiovascular Pedal Pulses WNL. No clubbing, cyanosis or edema. Musculoskeletal Adexa without tenderness or enlargement.. Digits and nails w/o clubbing, cyanosis, infection, petechiae, ischemia, or inflammatory conditions.. Integumentary (Hair, Skin) No suspicious lesions. No crepitus or fluctuance. No peri-wound warmth or erythema. No masses.Marland Kitchen Psychiatric Judgement and insight Intact.. No evidence of depression, anxiety, or agitation.. Notes the wound on the right lateral malleolus is looking very clean and we will be ready for Apligraf next week. Electronic Signature(s) Signed: 10/29/2014 9:42:50 AM By: Evlyn Kanner  MD, FACS Entered By: Evlyn Kanner on 10/29/2014 09:42:50 [name] [name] ([id number]) -------------------------------------------------------------------------------- Physician Orders Details Patient Name: [name], [name]. Date of Service: 10/29/2014 8:45 AM Medical Record Number: [id number] Patient Account Number: [id number] Date of Birth/Sex: 06-26-1985 (28 y.o. Female) Treating RN: Curtis Sites Primary Care Physician: Felix Pacini Other Clinician: Referring Physician: Felix Pacini Treating Physician/Extender: Rudene Re in Treatment: 5 Verbal / Phone Orders: Yes Clinician: Curtis Sites Read Back and Verified: Yes Diagnosis Coding Wound Cleansing Wound #1 Right,Lateral Malleolus o May shower with protection. o No tub bath. Anesthetic Wound #1 Right,Lateral Malleolus o Topical Lidocaine 4% cream applied to wound bed prior to debridement Skin Barriers/Peri-Wound Care Wound #1 Right,Lateral Malleolus o Barrier cream Primary Wound Dressing Wound #1 Right,Lateral Malleolus o Prisma Ag Secondary Dressing Wound #1 Right,Lateral Malleolus o Dry Gauze Dressing Change Frequency Wound #1 Right,Lateral Malleolus o Change dressing every week Follow-up Appointments Wound #1 Right,Lateral Malleolus o Return Appointment in 1 week. Edema Control Wound #1 Right,Lateral Malleolus o [employer] to Right Lower Extremity SALAYA, HOLTROP ([id number]) Electronic Signature(s) Signed: 10/29/2014 10:29:05 AM By: Curtis Sites Signed: 10/29/2014 12:17:31 PM By: Evlyn Kanner MD, FACS Entered By: Curtis Sites on 10/29/2014 09:44:34 Larzelere, [name] [name] ([id number]) -------------------------------------------------------------------------------- Problem List Details Patient Name: [name], [name]. Date of Service: 10/29/2014 8:45 AM Medical Record Number: [id number] Patient Account Number: [id number] Date of Birth/Sex: 1985-07-28 (28 y.o. 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 Date of Service: 10/29/2014 8:45 AM Medical Record Number: [id number] Patient Account Number: [id number] Date of Birth/Sex: 04/15/1986 (28 y.o. Female) Treating RN: Primary Care Physician: KUNEFF, RENEE Other Clinician: Referring Physician: Claiborne Billings, RENEE Treating Physician/Extender: Rudene Re in Treatment: 5 Subjective Chief Complaint Information obtained from Patient Patient presents for treatment of an open ulcer due to venous insufficiency. this 29 year old patient has had an ulcer on the lateral part of her right lower extremity and this has been there for 2 weeks. History of Present Illness (HPI) The following HPI elements were documented for the patient's wound: Location: the lateral part of her right ankle Quality: Patient reports experiencing a dull pain to affected area(s). Severity: Patient states wound are getting worse. Duration: Patient has had the wound for < 2 weeks prior to presenting for treatment Timing: Pain in wound is Intermittent (comes and goes Context: The wound appeared gradually over time Modifying Factors: Consults to this date include:seen in the ER and  asked to follow up with wound care. Associated Signs and Symptoms: Patient reports having difficulty standing for long periods. 29 year old patient who started with having ulcerations on the right lower leg on the lateral part of her ankle for about 2 weeks. She was seen in the ER at [facility] and advised to see the wound care for a consultation. No X-rays of workup was done during the ER visit and no prescription for any medications of compression wraps were given. the patient is not diabetic but does have hypertension and her medications have been reviewed by me. 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 The right and left greater saphenous vein was found to be tortuous. Deep venous system was also not competent and there was reflux of more than 500 ms. She was then seen by Dr. Tawanna Cooler Early who recommended that the patient would not benefit from endovenous ablation and he had recommended vein stripping on the right side and multiple small phlebectomy procedures on the left side. the patient did not follow-up due to social economic reasons. She has not been wearing any compression stockings and has not taken any specific treatment for varicose veins for the last 3 years. 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 We have obtained some appointments to see the vascular surgeons by the end of the month and the patient would like to follow up with me at my [facility] on Wednesday, June 29. 10/14/2014 -- she could not see me yesterday in [place] and hence has come for a review today. She [name], [name] ([id number]) has a vascular workup to be done this afternoon at [facility]. She is doing fine otherwise. 10/22/2014 --  she was seen by Dr. [name] and he has recommended surgical removal of her right saphenous vein from distal thigh to saphenofemoral junction and stab phlebectomy's of multiple large tributary branches throughout her thigh and calf. This would be done under general anesthesia in the outpatient setting. 10/29/2014 -- she is trying to work on a surgical date and in the meanwhile we have got insurance clearance for Apligraf and we will start this next week. Objective Constitutional Pulse regular. Respirations normal and  unlabored. Afebrile. Vitals Time Taken: 9:12 AM, Height: 66 in, Weight: 350.3 lbs, BMI: 56.5, Temperature: 98.2 F, Pulse: 72 bpm, Respiratory Rate: 16 breaths/min, Blood Pressure: 102/55 mmHg. Eyes Nonicteric. Reactive to light. Ears, Nose, Mouth, and Throat Lips, teeth, and gums WNL.Marland Kitchen. Moist mucosa without lesions . Neck supple and nontender. No palpable supraclavicular or cervical adenopathy. Normal sized without goiter. Respiratory WNL. No retractions.. Cardiovascular Pedal Pulses WNL. No clubbing, cyanosis or edema. Musculoskeletal Adexa without tenderness or enlargement.. Digits and nails w/o clubbing, cyanosis, infection, petechiae, ischemia, or inflammatory conditions.Marland Kitchen. Psychiatric Judgement and insight Intact.. No evidence of depression, anxiety, or agitation.. General Notes: the wound on the right lateral malleolus is looking very clean and we will be ready for Apligraf next week. Integumentary (Hair, Skin) [name], [name] ([id number]) No suspicious lesions. No crepitus or fluctuance. No peri-wound warmth or erythema. No masses.. Wound #1 status is Open. Original cause of wound was Not Known. The wound is located on the Right,Lateral Malleolus. The wound measures 1.4cm length x 1.7cm width x 0.1cm depth; 1.869cm^2 area and 0.187cm^3 volume. The wound is limited to skin breakdown. There is no tunneling or undermining noted. There is a medium amount of  serosanguineous drainage noted. The wound margin is distinct with the outline attached to the wound base. There is large (67-100%) red granulation within the wound bed. There is no necrotic tissue within the wound bed. The periwound skin appearance exhibited: Localized Edema, Moist, Hemosiderin Staining. The periwound skin appearance did not exhibit: Callus, Crepitus, Excoriation, Fluctuance, Friable, Induration, Rash, Scarring, Dry/Scaly, Maceration, Atrophie Blanche, Cyanosis, Ecchymosis, Mottled, Pallor, Rubor, Erythema. Periwound temperature was noted as No Abnormality. The periwound has tenderness on palpation. 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 Her surgical date may be around August 10. Plan Wound Cleansing: Wound #1 Right,Lateral Malleolus: May shower with protection. No tub bath. Anesthetic: Wound #1 Right,Lateral Malleolus: Topical Lidocaine 4% cream applied to wound bed prior to debridement Skin Barriers/Peri-Wound Care: Wound #1 Right,Lateral Malleolus: Barrier cream Primary Wound Dressing: Wound #1 Right,Lateral Malleolus: [name], [name] ([id number]) Prisma Ag Secondary Dressing: Wound #1 Right,Lateral Malleolus: Dry Gauze Dressing Change Frequency: Wound #1 Right,Lateral Malleolus: Change dressing every week Follow-up Appointments: Wound #1 Right,Lateral Malleolus: Return Appointment in 1 week. Edema Control: Wound #1 Right,Lateral Malleolus: Unna Boot to Right Lower Extremity This week we will use Prisma [employer] and a Unna's boot and see her back next week for her first application of Apligraf. Her surgical date may be around August 10. Electronic  Signature(s) Signed: 10/29/2014 4:32:45 PM By: Evlyn KannerBritto, Annie Roseboom MD, FACS Previous Signature: 10/29/2014 9:43:39 AM Version By: Evlyn KannerBritto, [name] Stampley MD, FACS Entered By: Evlyn KannerBritto, Zia Kanner on 10/29/2014 16:32:45 [name], [name] PatterSHAMA J. ([id number]) -------------------------------------------------------------------------------- SuperBill Details Patient Name: [name], Gradie J. Date of Service: 10/29/2014 Medical Record Number: [id number] Patient Account Number: [id number] Date of Birth/Sex: 07/09/1985 (28 y.o. 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 E66.01 Morbid (severe) obesity due to excess calories  Facility Procedures CPT4: Description Modifier Quantity Code [id number] (Facility Use Only) [phone] - APPLY MULTLAY COMPRS LWR RT 1 LEG Physician Procedures CPT4 Code Description: [id number] - WC PHYS LEVEL 3 - EST PT ICD-10 Description Diagnosis I83.013 Varicose veins of right lower extremity with ulcer L97.312 Non-pressure chronic ulcer of right ankle with fat Modifier: of ankle layer expose Quantity: 1 d Electronic Signature(s) Signed: 10/29/2014 10:29:05 AM By: Curtis Sites Signed: 10/29/2014 12:17:31 PM By: Evlyn Kanner MD, FACS Previous Signature: 10/29/2014 9:43:53 AM Version By: Evlyn Kanner MD, FACS Entered By: Curtis Sites on 10/29/2014 09:44:53

## 2014-10-29 NOTE — Progress Notes (Signed)
[name], [name] ([id number]) Visit Report for 10/29/2014 Arrival Information Details Patient Name: [name], [name] Date of Service: 10/29/2014 8:45 AM Medical Record Number: [id number] Patient Account Number: [id number] Date of Birth/Sex: 01/22/1986 (28 y.o. Female) Treating RN: Curtis Sitesorthy, Joanna Primary Care Physician: Felix PaciniKUNEFF, RENEE Other Clinician: Referring Physician: Felix PaciniKUNEFF, RENEE Treating Physician/Extender: [name], Errol Weeks in Treatment: 5 Visit Information History Since Last Visit Added or deleted any medications: No Patient Arrived: Ambulatory Any new allergies or adverse reactions: No Arrival Time: 09:11 Had a fall or experienced change in No Accompanied By: self activities of daily living that may affect Transfer Assistance: None risk of falls: Patient Identification Verified: Yes Signs or symptoms of abuse/neglect since last No Secondary Verification Process Yes visito Completed: Hospitalized since last visit: No Patient Has Alerts: Yes Pain Present Now: No Patient Alerts: 09/2013 ABI R: 1.3 Electronic Signature(s) Signed: 10/29/2014 10:29:05 AM By: Curtis Sitesorthy, Joanna Entered By: Curtis Sitesorthy, Joanna on 10/29/2014 09:12:09 [name], [name] ([id number]) -------------------------------------------------------------------------------- Encounter Discharge Information Details Patient Name: [name], [name] Date of Service: 10/29/2014 8:45 AM Medical Record Number: [id number] Patient Account Number: [id number] Date of Birth/Sex: 12/15/1985 (28 y.o. Female) Treating RN: Curtis Sitesorthy, Joanna Primary Care Physician: Felix PaciniKUNEFF, RENEE Other Clinician: Referring Physician: Felix PaciniKUNEFF, RENEE Treating Physician/Extender: [name], Errol Weeks in Treatment: 5 Encounter Discharge Information Items Discharge Pain Level: 0 Discharge Condition: Stable Ambulatory Status: Ambulatory Discharge Destination: Home Private Transportation: Auto Accompanied By: self Schedule Follow-up Appointment:  Yes Medication Reconciliation completed and No provided to Patient/Care Luwana Butrick: Clinical Summary of Care: Electronic Signature(s) Signed: 10/29/2014 10:11:57 AM By: Curtis Sitesorthy, Joanna Entered By: Curtis Sitesorthy, Joanna on 10/29/2014 10:11:56 [name], [name] ([id number]) -------------------------------------------------------------------------------- Lower Extremity Assessment Details Patient Name: [name], [name] Date of Service: 10/29/2014 8:45 AM Medical Record Number: [id number] Patient Account Number: [id number] Date of Birth/Sex: 11/22/1985 (28 y.o. Female) Treating RN: Curtis Sitesorthy, Joanna Primary Care Physician: Felix PaciniKUNEFF, RENEE Other Clinician: Referring Physician: KUNEFF, RENEE Treating Physician/Extender: [name], Errol Weeks in Treatment: 5 Edema Assessment Assessed: [Left: No] [Right: No] Edema: [Left: Ye] [Right: s] Calf Left: Right: Point of Measurement: 34 cm From Medial Instep cm 55.6 cm Ankle Left: Right: Point of Measurement: 10 cm From Medial Instep cm 29.1 cm Vascular Assessment Pulses: Posterior Tibial Palpable: [Right:Yes] Dorsalis Pedis Palpable: [Right:Yes] Extremity colors, hair growth, and conditions: Extremity Color: [Right:Normal] Hair Growth on Extremity: [Right:Yes] Temperature of Extremity: [Right:Warm] Capillary Refill: [Right:< 3 seconds] Electronic Signature(s) Signed: 10/29/2014 10:29:05 AM By: Curtis Sitesorthy, Joanna Entered By: Curtis Sitesorthy, Joanna on 10/29/2014 09:24:20 [name], [name] ([id number]) -------------------------------------------------------------------------------- Multi Wound Chart Details Patient Name: [name], [name] Date of Service: 10/29/2014 8:45 AM Medical Record Number: [id number] Patient Account Number: [id number] Date of Birth/Sex: 06/01/1985 (28 y.o. Female) Treating RN: Curtis Sitesorthy, Joanna Primary Care Physician: Felix PaciniKUNEFF, RENEE Other Clinician: Referring Physician: KUNEFF, RENEE Treating Physician/Extender: [name], Errol Weeks in Treatment:  5 Vital Signs Height(in): 66 Pulse(bpm): 72 Weight(lbs): 350.3 Blood Pressure 102/55 (mmHg): Body Mass Index(BMI): 57 Temperature(F): 98.2 Respiratory Rate 16 (breaths/min): Photos: [1:No Photos] [N/A:N/A] Wound Location: [1:Right Malleolus - Lateral] [N/A:N/A] Wounding Event: [1:Not Known] [N/A:N/A] Primary Etiology: [1:Venous Leg Ulcer] [N/A:N/A] Date Acquired: [1:09/06/2014] [N/A:N/A] Weeks of Treatment: [1:5] [N/A:N/A] Wound Status: [1:Open] [N/A:N/A] Measurements L x W x D 1.4x1.7x0.1 [N/A:N/A] (cm) Area (cm) : [1:1.869] [N/A:N/A] Volume (cm) : [1:0.187] [N/A:N/A] % Reduction in Area: [1:15.00%] [N/A:N/A] % Reduction in Volume: 71.70% [N/A:N/A] Classification: [1:Full Thickness Without Exposed Support Structures] [N/A:N/A] Exudate Amount: [1:Medium] [N/A:N/A] Exudate Type: [1:Serosanguineous] [N/A:N/A] Exudate Color: [1:red, brown] [N/A:N/A] Wound Margin: [1:Distinct, outline attached] [N/A:N/A] Granulation  Amount: [1:Large (67-100%)] [N/A:N/A] Granulation Quality: [1:Red] [N/A:N/A] Necrotic Amount: [1:None Present (0%)] [N/A:N/A] Exposed Structures: [1:Fascia: No Fat: No Tendon: No Muscle: No Joint: No Bone: No] [N/A:N/A] Limited to Skin Breakdown Epithelialization: Small (1-33%) N/A N/A Periwound Skin Texture: Edema: Yes N/A N/A Excoriation: No Induration: No Callus: No Crepitus: No Fluctuance: No Friable: No Rash: No Scarring: No Periwound Skin Moist: Yes N/A N/A Moisture: Maceration: No Dry/Scaly: No Periwound Skin Color: Hemosiderin Staining: Yes N/A N/A Atrophie Blanche: No Cyanosis: No Ecchymosis: No Erythema: No Mottled: No Pallor: No Rubor: No Temperature: No Abnormality N/A N/A Tenderness on Yes N/A N/A Palpation: Wound Preparation: Ulcer Cleansing: Other: N/A N/A soap and water Topical Anesthetic Applied: Other: Lidocaine 4% Ointment Treatment Notes Electronic Signature(s) Signed: 10/29/2014 10:29:05 AM By: Curtis Sites Entered By: Curtis Sites on 10/29/2014 09:33:34 Self, [name] ([id number]) -------------------------------------------------------------------------------- Multi-Disciplinary Care Plan Details Patient Name: [name], CALLANAN. Date of Service: 10/29/2014 8:45 AM Medical Record Number: [id number] Patient Account Number: [id number] Date of Birth/Sex: 1985-09-10 (28 y.o. Female) Treating RN: Curtis Sites Primary Care Physician: Felix Pacini Other Clinician: Referring Physician: Felix Pacini Treating Physician/Extender: Rudene Re in Treatment: 5 Active Inactive Abuse / Safety / Falls / Self Care Management Nursing Diagnoses: Potential for falls Goals: Patient will remain injury free Date Initiated: 09/20/2014 Goal Status: Active Interventions: Assess fall risk on admission and as needed Notes: Orientation to the Wound Care Program Nursing Diagnoses: Knowledge deficit related to the wound healing center program Goals: Patient/caregiver will verbalize understanding of the Wound Healing Center Program Date Initiated: 09/20/2014 Goal Status: Active Interventions: Provide education on orientation to the wound center Notes: Pain, Acute or Chronic Nursing Diagnoses: Pain Management - Cyclic Acute (Dressing Change Related) Goals: Patient will verbalize adequate pain control and receive pain control interventions during procedures as needed BRANDEY, VANDALEN ([id number]) Date Initiated: 09/20/2014 Goal Status: Active Interventions: Complete pain assessment as per visit requirements Notes: Venous Leg Ulcer Nursing Diagnoses: Potential for venous Insuffiency (use before diagnosis confirmed) Goals: Non-invasive venous studies are completed as ordered Date Initiated: 09/20/2014 Goal Status: Active Interventions: Assess peripheral edema status every visit. Notes: Wound/Skin Impairment Nursing Diagnoses: Impaired tissue integrity Goals: Ulcer/skin breakdown will  have a volume reduction of 30% by week 4 Date Initiated: 09/20/2014 Goal Status: Active Interventions: Assess ulceration(s) every visit Notes: Electronic Signature(s) Signed: 10/29/2014 10:29:05 AM By: Curtis Sites Entered By: Curtis Sites on 10/29/2014 09:33:27 Aguallo, [name] ([id number]) -------------------------------------------------------------------------------- Patient/Caregiver Education Details Patient Name: [name], [name] Date of Service: 10/29/2014 8:45 AM Medical Record Number: [id number] Patient Account Number: [id number] Date of Birth/Gender: [birth year] (28 y.o. Female) Treating RN: Curtis Sites Primary Care Physician: Felix Pacini Other Clinician: Referring Physician: Felix Pacini Treating Physician/Extender: Rudene Re in Treatment: 5 Education Assessment Education Provided To: Patient Education Topics Provided Venous: Handouts: Other: continue elevation for edema management Methods: Explain/Verbal Responses: State content correctly Electronic Signature(s) Signed: 10/29/2014 10:12:29 AM By: Curtis Sites Entered By: Curtis Sites on 10/29/2014 10:12:29 [name], [name] ([id number]) -------------------------------------------------------------------------------- Wound Assessment Details Patient Name: NAYDELIN, ZIEGLER. Date of Service: 10/29/2014 8:45 AM Medical Record Number: [id number] Patient Account Number: [id number] Date of Birth/Sex: [birth year] (28 y.o. Female) Treating RN: Curtis Sites Primary Care Physician: Felix Pacini Other Clinician: Referring Physician: KUNEFF, RENEE Treating Physician/Extender: Rudene Re in Treatment: 5 Wound Status Wound Number: 1 Primary Etiology: Venous Leg Ulcer Wound Location: Right Malleolus - Lateral Wound Status: Open Wounding Event: Not Known Date Acquired: 09/06/2014 Weeks Of Treatment: 5 Clustered Wound:  No Wound Measurements Length: (cm) 1.4 Width: (cm) 1.7 Depth: (cm)  0.1 Area: (cm) 1.869 Volume: (cm) 0.187 % Reduction in Area: 15% % Reduction in Volume: 71.7% Epithelialization: Small (1-33%) Tunneling: No Undermining: No Wound Description Full Thickness Without Exposed Classification: Support Structures Wound Margin: Distinct, outline attached Exudate Medium Amount: Exudate Type: Serosanguineous Exudate Color: red, brown Foul Odor After Cleansing: No Wound Bed Granulation Amount: Large (67-100%) Exposed Structure Granulation Quality: Red Fascia Exposed: No Necrotic Amount: None Present (0%) Fat Layer Exposed: No Tendon Exposed: No Muscle Exposed: No Joint Exposed: No Bone Exposed: No Limited to Skin Breakdown Periwound Skin Texture Texture Color No Abnormalities Noted: No No Abnormalities Noted: No Callus: No Atrophie Blanche: No Crepitus: No Cyanosis: No KYUNG, MUTO ([id number]) Excoriation: No Ecchymosis: No Fluctuance: No Erythema: No Friable: No Hemosiderin Staining: Yes Induration: No Mottled: No Localized Edema: Yes Pallor: No Rash: No Rubor: No Scarring: No Temperature / Pain Moisture Temperature: No Abnormality No Abnormalities Noted: No Tenderness on Palpation: Yes Dry / Scaly: No Maceration: No Moist: Yes Wound Preparation Ulcer Cleansing: Other: soap and water, Topical Anesthetic Applied: Other: Lidocaine 4% Ointment , Treatment Notes Wound #1 (Right, Lateral Malleolus) 1. Cleansed with: Cleanse wound with antibacterial soap and water 2. Anesthetic Topical Lidocaine 4% cream to wound bed prior to debridement 4. Dressing Applied: Prisma Ag 5. Secondary Dressing Applied ABD Pad 7. Secured with Henriette Combs to Right Lower Extremity Electronic Signature(s) Signed: 10/29/2014 10:29:05 AM By: Curtis Sites Entered By: Curtis Sites on 10/29/2014 09:33:19 Grell, [name] ([id number]) -------------------------------------------------------------------------------- Vitals Details Patient  Name: PADDY, WALTHALL Date of Service: 10/29/2014 8:45 AM Medical Record Number: [id number] Patient Account Number: [id number] Date of Birth/Sex: 23-Nov-1985 (28 y.o. Female) Treating RN: Curtis Sites Primary Care Physician: Felix Pacini Other Clinician: Referring Physician: KUNEFF, RENEE Treating Physician/Extender: Rudene Re in Treatment: 5 Vital Signs Time Taken: 09:12 Temperature (F): 98.2 Height (in): 66 Pulse (bpm): 72 Weight (lbs): 350.3 Respiratory Rate (breaths/min): 16 Body Mass Index (BMI): 56.5 Blood Pressure (mmHg): 102/55 Reference Range: 80 - 120 mg / dl Electronic Signature(s) Signed: 10/29/2014 10:29:05 AM By: Curtis Sites Entered By: Curtis Sites on 10/29/2014 09:14:44

## 2014-11-05 ENCOUNTER — Encounter: Payer: BLUE CROSS/BLUE SHIELD | Admitting: Surgery

## 2014-11-05 DIAGNOSIS — L97312 Non-pressure chronic ulcer of right ankle with fat layer exposed: Secondary | ICD-10-CM | POA: Diagnosis not present

## 2014-11-05 NOTE — Progress Notes (Addendum)
NIREL, BABLER ([id number]) Visit Report for 11/05/2014 Arrival Information Details Patient Name: [name], [name]. Date of Service: 11/05/2014 8:45 AM Medical Record Number: [id number] Patient Account Number: [id number] Date of Birth/Sex: 01-22-1986 (28 y.o. Female) Treating RN: [name], RN, [name], [place] Sink Primary Care Physician: Felix Pacini Other Clinician: Referring Physician: Felix Pacini Treating Physician/Extender: Rudene Re in Treatment: 6 Visit Information History Since Last Visit Any new allergies or adverse reactions: No Patient Arrived: Ambulatory Had a fall or experienced change in No Arrival Time: 09:02 activities of daily living that may affect Accompanied By: self risk of falls: Transfer Assistance: None Signs or symptoms of abuse/neglect since last No Patient Identification Verified: Yes visito Secondary Verification Process Yes Hospitalized since last visit: No Completed: Has Dressing in Place as Prescribed: Yes Patient Has Alerts: Yes Has Compression in Place as Prescribed: Yes Patient Alerts: 09/2013 ABI R: Pain Present Now: No 1.3 Electronic Signature(s) Signed: 11/05/2014 9:06:05 AM By: Elpidio Eric [name], RN Entered By: Elpidio Eric on 11/05/2014 09:06:04 Panther, [name] ([id number]) -------------------------------------------------------------------------------- Encounter Discharge Information Details Patient Name: [name], [name]. Date of Service: 11/05/2014 8:45 AM Medical Record Number: [id number] Patient Account Number: [id number] Date of Birth/Sex: 15-Feb-1986 (28 y.o. Female) Treating RN: [name], RN, [name], [place] Sink Primary Care Physician: Felix Pacini Other Clinician: Referring Physician: Felix Pacini Treating Physician/Extender: Rudene Re in Treatment: 6 Encounter Discharge Information Items Discharge Pain Level: 0 Discharge Condition: Stable Ambulatory Status: Ambulatory Discharge Destination:  Home Private Transportation: Auto Accompanied By: self Schedule Follow-up Appointment: No Medication Reconciliation completed and No provided to Patient/Care Daquan Crapps: Clinical Summary of Care: Electronic Signature(s) Signed: 11/05/2014 2:16:09 PM By: Elpidio Eric [name], RN Entered By: Elpidio Eric on 11/05/2014 09:41:55 Strong, [name] ([id number]) -------------------------------------------------------------------------------- Lower Extremity Assessment Details Patient Name: [name], [name] Date of Service: 11/05/2014 8:45 AM Medical Record Number: [id number] Patient Account Number: [id number] Date of Birth/Sex: October 25, 1985 (28 y.o. Female) Treating RN: [name], RN, [name], [place] Sink Primary Care Physician: Felix Pacini Other Clinician: Referring Physician: Felix Pacini Treating Physician/Extender: Rudene Re in Treatment: 6 Edema Assessment Assessed: [Left: No] [Right: No] E[Left: dema] [Right: :] Calf Left: Right: Point of Measurement: 34 cm From Medial Instep cm 55.5 cm Ankle Left: Right: Point of Measurement: 10 cm From Medial Instep cm 29.1 cm Vascular Assessment Claudication: Claudication Assessment [Right:None] Pulses: Posterior Tibial Dorsalis Pedis Palpable: [Right:Yes] Extremity colors, hair growth, and conditions: Extremity Color: [Right:Mottled] Hair Growth on Extremity: [Right:No] Temperature of Extremity: [Right:Warm] Capillary Refill: [Right:< 3 seconds] Dependent Rubor: [Right:No] Blanched when Elevated: [Right:No] Lipodermatosclerosis: [Right:No] Electronic Signature(s) Signed: 11/05/2014 9:07:25 AM By: Elpidio Eric [name], RN Entered By: Elpidio Eric on 11/05/2014 09:07:25 Nop, [name] ([id number]) -------------------------------------------------------------------------------- Multi Wound Chart Details Patient Name: [name], [name] Date of Service: 11/05/2014 8:45 AM Medical Record Number: [id number] Patient Account Number: [id number] Date of  Birth/Sex: Oct 09, 1985 (28 y.o. Female) Treating RN: [name] Primary Care Physician: Felix Pacini Other Clinician: Referring Physician: Felix Pacini Treating Physician/Extender: Rudene Re in Treatment: 6 Vital Signs Height(in): 66 Pulse(bpm): 68 Weight(lbs): 350.3 Blood Pressure 125/46 (mmHg): Body Mass Index(BMI): 57 Temperature(F): 98.0 Respiratory Rate 16 (breaths/min): Photos: [1:No Photos] [N/A:N/A] Wound Location: [1:Right Malleolus - Lateral] [N/A:N/A] Wounding Event: [1:Not Known] [N/A:N/A] Primary Etiology: [1:Venous Leg Ulcer] [N/A:N/A] Date Acquired: [1:09/06/2014] [N/A:N/A] Weeks of Treatment: [1:6] [N/A:N/A] Wound Status: [1:Open] [N/A:N/A] Measurements L x W x D 1.3x2x0.1 [N/A:N/A] (cm) Area (cm) : [1:2.042] [N/A:N/A] Volume (cm) : [1:0.204] [N/A:N/A] % Reduction in Area: [1:7.10%] [N/A:N/A] % Reduction in Volume:  69.10% [N/A:N/A] Classification: [1:Full Thickness Without Exposed Support Structures] [N/A:N/A] Exudate Amount: [1:Medium] [N/A:N/A] Exudate Type: [1:Serosanguineous] [N/A:N/A] Exudate Color: [1:red, brown] [N/A:N/A] Wound Margin: [1:Distinct, outline attached] [N/A:N/A] Granulation Amount: [1:Large (67-100%)] [N/A:N/A] Granulation Quality: [1:Red] [N/A:N/A] Necrotic Amount: [1:None Present (0%)] [N/A:N/A] Exposed Structures: [1:Fascia: No Fat: No Tendon: No Muscle: No Joint: No Bone: No] [N/A:N/A] Limited to Skin Breakdown Epithelialization: Small (1-33%) N/A N/A Periwound Skin Texture: Edema: Yes N/A N/A Excoriation: No Induration: No Callus: No Crepitus: No Fluctuance: No Friable: No Rash: No Scarring: No Periwound Skin Moist: Yes N/A N/A Moisture: Maceration: No Dry/Scaly: No Periwound Skin Color: Hemosiderin Staining: Yes N/A N/A Atrophie Blanche: No Cyanosis: No Ecchymosis: No Erythema: No Mottled: No Pallor: No Rubor: No Temperature: No Abnormality N/A N/A Tenderness on Yes N/A N/A Palpation: Wound  Preparation: Ulcer Cleansing: Other: N/A N/A soap and water Topical Anesthetic Applied: Other: Lidocaine 4% Ointment Treatment Notes Electronic Signature(s) Signed: 11/05/2014 5:03:22 PM By: Elliot Gurney, RN, [name], [name], [name] Entered By: Elliot Gurney, RN, [name], Kim on 11/05/2014 09:23:54 Coven, [name] -------------------------------------------------------------------------------- Multi-Disciplinary Care Plan Details Patient Name: [name], [name]. Date of Service: 11/05/2014 8:45 AM Medical Record Number: [id number] Patient Account Number: [id number] Date of Birth/Sex: 1985/07/02 (28 y.o. Female) Treating RN: [name] Primary Care Physician: Felix Pacini Other Clinician: Referring Physician: Felix Pacini Treating Physician/Extender: Rudene Re in Treatment: 6 Active Inactive Abuse / Safety / Falls / Self Care Management Nursing Diagnoses: Potential for falls Goals: Patient will remain injury free Date Initiated: 09/20/2014 Goal Status: Active Interventions: Assess fall risk on admission and as needed Notes: Orientation to the Wound Care Program Nursing Diagnoses: Knowledge deficit related to the wound healing center program Goals: Patient/caregiver will verbalize understanding of the Wound Healing Center Program Date Initiated: 09/20/2014 Goal Status: Active Interventions: Provide education on orientation to the wound center Notes: Pain, Acute or Chronic Nursing Diagnoses: Pain Management - Cyclic Acute (Dressing Change Related) Goals: Patient will verbalize adequate pain control and receive pain control interventions during procedures as needed NASHIYA, DISBROW ([id number]) Date Initiated: 09/20/2014 Goal Status: Active Interventions: Complete pain assessment as per visit requirements Notes: Venous Leg Ulcer Nursing Diagnoses: Potential for venous Insuffiency (use before diagnosis confirmed) Goals: Non-invasive venous studies are completed as  ordered Date Initiated: 09/20/2014 Goal Status: Active Interventions: Assess peripheral edema status every visit. Notes: Wound/Skin Impairment Nursing Diagnoses: Impaired tissue integrity Goals: Ulcer/skin breakdown will have a volume reduction of 30% by week 4 Date Initiated: 09/20/2014 Goal Status: Active Interventions: Assess ulceration(s) every visit Notes: Electronic Signature(s) Signed: 11/05/2014 5:03:22 PM By: Elliot Gurney, RN, [name], [name], [name] Entered By: Elliot Gurney, RN, [name], Kim on 11/05/2014 09:23:37 Verry, [name] ([id number]) -------------------------------------------------------------------------------- Pain Assessment Details Patient Name: [name], [name]. Date of Service: 11/05/2014 8:45 AM Medical Record Number: [id number] Patient Account Number: [id number] Date of Birth/Sex: 1985-10-07 (28 y.o. Female) Treating RN: [name], RN, [name], [place] Sink Primary Care Physician: Felix Pacini Other Clinician: Referring Physician: Felix Pacini Treating Physician/Extender: Rudene Re in Treatment: 6 Active Problems Location of Pain Severity and Description of Pain Patient Has Paino No Site Locations Pain Management and Medication Current Pain Management: Electronic Signature(s) Signed: 11/05/2014 9:06:13 AM By: Elpidio Eric [name], RN Entered By: Elpidio Eric on 11/05/2014 09:06:12 Mctighe, [name] ([id number]) -------------------------------------------------------------------------------- Patient/Caregiver Education Details Patient Name: [name], [name]. Date of Service: 11/05/2014 8:45 AM Medical Record Number: [id number] Patient Account Number: [id number] Date of Birth/Gender: Apr 03, 1986 (28 y.o. Female) Treating RN: [name], RN, [name], [facility] Primary Care Physician:  KUNEFF, RENEE Other Clinician: Referring Physician: KUNEFF, RENEE Treating Physician/Extender: Rudene Re in Treatment: 6 Education Assessment Education Provided To: Patient Education Topics Provided Basic  Hygiene: Methods: Explain/Verbal Responses: State content correctly Welcome To The Wound Care Center: Methods: Explain/Verbal Responses: State content correctly Electronic Signature(s) Signed: 11/05/2014 2:16:09 PM By: Elpidio Eric [name], RN Entered By: Elpidio Eric on 11/05/2014 09:42:07 Wingard, [name] ([id number]) -------------------------------------------------------------------------------- Wound Assessment Details Patient Name: [name], [name]. Date of Service: 11/05/2014 8:45 AM Medical Record Number: [id number] Patient Account Number: [id number] Date of Birth/Sex: 1985-10-11 (28 y.o. Female) Treating RN: [name], RN, [name], Psychologist, clinical Primary Care Physician: Felix Pacini Other Clinician: Referring Physician: KUNEFF, RENEE Treating Physician/Extender: Rudene Re in Treatment: 6 Wound Status Wound Number: 1 Primary Etiology: Venous Leg Ulcer Wound Location: Right Malleolus - Lateral Wound Status: Open Wounding Event: Not Known Date Acquired: 09/06/2014 Weeks Of Treatment: 6 Clustered Wound: No Photos Photo Uploaded By: Elpidio Eric on 11/05/2014 14:31:21 Wound Measurements Length: (cm) 1.3 Width: (cm) 2 Depth: (cm) 0.1 Area: (cm) 2.042 Volume: (cm) 0.204 % Reduction in Area: 7.1% % Reduction in Volume: 69.1% Epithelialization: Small (1-33%) Tunneling: No Wound Description Full Thickness Without Exposed Classification: Support Structures Wound Margin: Distinct, outline attached Exudate Medium Amount: Exudate Type: Serosanguineous Exudate Color: red, brown Foul Odor After Cleansing: No Wound Bed Granulation Amount: Large (67-100%) Exposed Structure Granulation Quality: Red Fascia Exposed: No Necrotic Amount: None Present (0%) Fat Layer Exposed: No Sheridan, [name] ([id number]) Tendon Exposed: No Muscle Exposed: No Joint Exposed: No Bone Exposed: No Limited to Skin Breakdown Periwound Skin Texture Texture Color No Abnormalities Noted: No No  Abnormalities Noted: No Callus: No Atrophie Blanche: No Crepitus: No Cyanosis: No Excoriation: No Ecchymosis: No Fluctuance: No Erythema: No Friable: No Hemosiderin Staining: Yes Induration: No Mottled: No Localized Edema: Yes Pallor: No Rash: No Rubor: No Scarring: No Temperature / Pain Moisture Temperature: No Abnormality No Abnormalities Noted: No Tenderness on Palpation: Yes Dry / Scaly: No Maceration: No Moist: Yes Wound Preparation Ulcer Cleansing: Other: soap and water, Topical Anesthetic Applied: Other: Lidocaine 4% Ointment , Treatment Notes Wound #1 (Right, Lateral Malleolus) 1. Cleansed with: Cleanse wound with antibacterial soap and water 4. Dressing Applied: Other dressing (specify in notes) 5. Secondary Dressing Applied Dry Gauze 7. Secured with Henriette Combs to Right Lower Extremity Notes apligraf applied by MD. Drawtex over wound Electronic Signature(s) Signed: 11/05/2014 9:15:13 AM By: Elpidio Eric [name], RN Entered By: Elpidio Eric on 11/05/2014 09:15:12 Deyton, [name] ([id number]) -------------------------------------------------------------------------------- Vitals Details Patient Name: [name], [name] Date of Service: 11/05/2014 8:45 AM Medical Record Number: [id number] Patient Account Number: [id number] Date of Birth/Sex: [birth year] (28 y.o. Female) Treating RN: [name], RN, [name], Rita Primary Care Physician: Felix Pacini Other Clinician: Referring Physician: KUNEFF, RENEE Treating Physician/Extender: Rudene Re in Treatment: 6 Vital Signs Time Taken: 09:06 Temperature (F): 98.0 Height (in): 66 Pulse (bpm): 68 Weight (lbs): 350.3 Respiratory Rate (breaths/min): 16 Body Mass Index (BMI): 56.5 Blood Pressure (mmHg): 125/46 Reference Range: 80 - 120 mg / dl Electronic Signature(s) Signed: 11/05/2014 9:06:35 AM By: Elpidio Eric [name], RN Entered By: Elpidio Eric on 11/05/2014 09:06:35

## 2014-11-06 NOTE — Progress Notes (Signed)
[name], [name] ([id number]) Visit Report for 11/05/2014 Chief Complaint Document Details Patient Name: [name], [name]. Date of Service: 11/05/2014 8:45 AM Medical Record Number: [id number] Patient Account Number: [id number] Date of Birth/Sex: [name], 1985 (28 y.o. Female) Treating RN: Primary Care Physician: KUNEFF, RENEE Other Clinician: Referring Physician: Claiborne Billings, RENEE Treating Physician/Extender: Rudene Re in Treatment: 6 Information Obtained from: Patient Chief Complaint Patient presents for treatment of an open ulcer due to venous insufficiency. this 29 year old patient has had an ulcer on the lateral part of her right lower extremity and this has been there for 2 weeks. Electronic Signature(s) Signed: 11/05/2014 9:39:23 AM By: Evlyn Kanner MD, FACS Entered By: Evlyn Kanner on 11/05/2014 09:39:23 [name], [name] ([id number]) -------------------------------------------------------------------------------- Cellular or Tissue Based Product Details Patient Name: [name], [name]. Date of Service: 11/05/2014 8:45 AM Medical Record Number: [id number] Patient Account Number: [id number] Date of Birth/Sex: October 25, 1985 (28 y.o. Female) Treating RN: Primary Care Physician: KUNEFF, RENEE Other Clinician: Referring Physician: Claiborne Billings, RENEE Treating Physician/Extender: Rudene Re in Treatment: 6 Cellular or Tissue Based Wound #1 Right,Lateral Malleolus Product Type Applied to: Performed By: Physician Tristan Schroeder., MD Cellular or Tissue Based Apligraf Product Type: Time-Out Taken: Yes Location: genitalia / hands / feet / multiple digits Wound Size (sq cm): 2.6 Product Size (sq cm): 44 Waste Size (sq cm): 41 Waste Reason: wound size Amount of Product Applied (sq cm): 3 Lot #: [id number] Order #: [id number] Expiration Date: 11/10/2014 Fenestrated: Yes Instrument: Blade Reconstituted: Yes Solution Type: nacl Solution Amount: 15ml Lot #: [id number] Solution  Expiration 05/17/2016 Date: Secured: Yes Secured With: Steri-Strips Dressing Applied: Yes Primary Dressing: mepitel Procedural Pain: 0 Post Procedural Pain: 0 Response to Treatment: Procedure was tolerated well Electronic Signature(s) Signed: 11/05/2014 9:39:17 AM By: Evlyn Kanner MD, FACS Entered By: Evlyn Kanner on 11/05/2014 09:39:17 [name] ([id number]) -------------------------------------------------------------------------------- HPI Details Patient Name: [name], [name] Date of Service: 11/05/2014 8:45 AM Medical Record Number: [id number] Patient Account Number: [id number] Date of Birth/Sex: 1985-04-27 (28 y.o. Female) Treating RN: Primary Care Physician: KUNEFF, RENEE Other Clinician: Referring Physician: KUNEFF, RENEE Treating Physician/Extender: Rudene Re in Treatment: 6 History of Present Illness Location: the lateral part of her right ankle Quality: Patient reports experiencing a dull pain to affected area(s). Severity: Patient states wound are getting worse. Duration: Patient has had the wound for < 2 weeks prior to presenting for treatment Timing: Pain in wound is Intermittent (comes and goes Context: The wound appeared gradually over time Modifying Factors: Consults to this date include:seen in the ER and asked to follow up with wound care. Associated Signs and Symptoms: Patient reports having difficulty standing for long periods. HPI Description: 29 year old patient who started with having ulcerations on the right lower leg on the lateral part of her ankle for about 2 weeks. She was seen in the ER at [facility] and advised to see the wound care for a consultation. No X-rays of workup was done during the ER visit and no prescription for any medications of compression wraps were given. the patient is not diabetic but does have hypertension and her medications have been reviewed by me. 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 The right and left greater saphenous vein was found to be tortuous. Deep venous system was also not competent and there was reflux  of more than 500 ms. She was then seen by Dr. Tawanna Cooler Early who recommended that the patient would not benefit from endovenous ablation and he had recommended vein stripping on the right side and multiple small phlebectomy procedures on the left side. the patient did not follow-up due to social economic reasons. She has not been wearing any compression stockings and has not taken any specific treatment for varicose veins for the last 3 years. 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 We have obtained some appointments to see the vascular surgeons by the end of the month and the patient would like to follow up with me at my [facility] on Wednesday, June 29. 10/14/2014 -- she could not see me yesterday in [place] and hence has come for a review today. She has a vascular workup to be done this afternoon at [facility]. She is doing fine otherwise. 10/22/2014 -- she was seen by Dr. [name] and he has recommended surgical removal of her right saphenous vein from distal thigh to saphenofemoral junction and stab phlebectomy's of multiple large tributary branches throughout her thigh and calf. This would be done under general anesthesia in the outpatient setting. 10/29/2014 -- she is trying to work on a surgical date and in the meanwhile we have got insurance clearance for Apligraf and we will start this next week. 7/22 2016 -- she is here for the first application of Apligraf. [name], [name] ([id number]) Electronic Signature(s) Signed: 11/05/2014 9:39:47 AM By: Evlyn Kanner MD, FACS Entered By: Evlyn Kanner  on 11/05/2014 09:39:46 [name], [name] ([id number]) -------------------------------------------------------------------------------- Physical Exam Details Patient Name: [name], [name]. Date of Service: 11/05/2014 8:45 AM Medical Record Number: [id number] Patient Account Number: [id number] Date of Birth/Sex: 1985/12/04 (28 y.o. Female) Treating RN: Primary Care Physician: KUNEFF, RENEE Other Clinician: Referring Physician: KUNEFF, RENEE Treating Physician/Extender: Rudene Re in Treatment: 6 Constitutional . Pulse regular. Respirations normal and unlabored. Afebrile. . Eyes Nonicteric. Reactive to light. Ears, Nose, Mouth, and Throat Lips, teeth, and gums WNL.Marland Kitchen Moist mucosa without lesions . Neck supple and nontender. No palpable supraclavicular or cervical adenopathy. Normal sized without goiter. Respiratory WNL. No retractions.. Cardiovascular Pedal Pulses WNL. No clubbing, cyanosis or edema. Chest Breasts symmetical and no nipple discharge.. Breast tissue WNL, no masses, lumps, or tenderness.. Lymphatic No adneopathy. No adenopathy. No adenopathy. Musculoskeletal Adexa without tenderness or enlargement.. Digits and nails w/o clubbing, cyanosis, infection, petechiae, ischemia, or inflammatory conditions.. Integumentary (Hair, Skin) No suspicious lesions. No crepitus or fluctuance. No peri-wound warmth or erythema. No masses.Marland Kitchen Psychiatric Judgement and insight Intact.. No evidence of depression, anxiety, or agitation.. Notes the right lateral malleolar wound looks clean with healthy granulation tissue and she is going to have the tissue was stopped with saline and a fresh Apligraf is to be applied today. Electronic Signature(s) Signed: 11/05/2014 9:41:17 AM By: Evlyn Kanner MD, FACS Previous Signature: 11/05/2014 9:40:58 AM Version By: Evlyn Kanner MD, FACS Entered By: Evlyn Kanner on 11/05/2014 09:41:16 Say, [name]  ([id number]) -------------------------------------------------------------------------------- Physician Orders Details Patient Name: [name], [name]. Date of Service: 11/05/2014 8:45 AM Medical Record Number: [id number] Patient Account Number: [id number] Date of Birth/Sex: 1986/02/23 (28 y.o. Female) Treating RN: [name] Primary Care Physician: Felix Pacini Other Clinician: Referring Physician: Felix Pacini Treating Physician/Extender: Rudene Re in Treatment: 6 Verbal / Phone Orders: Yes Clinician: [name] Read Back and Verified: Yes Diagnosis Coding  Wound Cleansing Wound #1 Right,Lateral Malleolus o Clean wound with Normal Saline. o Cleanse wound with mild soap and water Anesthetic Wound #1 Right,Lateral Malleolus o Topical Lidocaine 4% cream applied to wound bed prior to debridement Secondary Dressing Wound #1 Right,Lateral Malleolus o ABD pad - ABD and Drawtex to bolster Dressing Change Frequency Wound #1 Right,Lateral Malleolus o Change dressing every week Follow-up Appointments o Return Appointment in 1 week. Edema Control Wound #1 Right,Lateral Malleolus o Unna Boot to Right Lower Extremity Additional Orders / Instructions Wound #1 Right,Lateral Malleolus o Increase protein intake. Advanced Therapies Wound #1 Right,Lateral Malleolus o Apligraf application in clinic; including contact layer, fixation with steri strips, dry gauze and cover dressing. Electronic Signature(s) JAMESHA, ELLSWORTH ([id number]) Signed: 11/05/2014 12:46:51 PM By: Evlyn Kanner MD, FACS Signed: 11/05/2014 5:03:22 PM By: Elliot Gurney RN, [name], [name], [name] Entered By: Elliot Gurney, RN, [name], Kim on 11/05/2014 40:98:11 [name], [name] ([id number]) -------------------------------------------------------------------------------- Problem List Details Patient Name: [name], [name]. Date of Service: 11/05/2014 8:45 AM Medical Record Number: [id number] Patient Account Number:  [id number] Date of Birth/Sex: 16-Mar-1986 (28 y.o. 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 [place] Date of Birth/Sex: Female) Other Clinician: Primary Care Physician: KUNEFF, RENEE Treating Evlyn Kanner Referring Physician: Felix Pacini Physician/Extender: Weeks in Treatment: 6 Subjective Chief Complaint Information obtained from Patient Patient presents for treatment of an open ulcer due to venous insufficiency. this 29 year old patient has had an ulcer on the lateral part of her right lower extremity and this has been there for 2 weeks. History of Present Illness (HPI) The following HPI elements were documented for the patient's wound: Location: the lateral part of her right ankle Quality: Patient reports experiencing a dull pain to affected area(s). Severity: Patient states wound are getting worse. Duration: Patient has had the wound for < 2 weeks prior to presenting for treatment Timing: Pain in wound is Intermittent (comes and goes Context: The wound appeared  gradually over time Modifying Factors: Consults to this date include:seen in the ER and asked to follow up with wound care. Associated Signs and Symptoms: Patient reports having difficulty standing for long periods. 29 year old patient who started with having ulcerations on the right lower leg on the lateral part of her ankle for about 2 weeks. She was seen in the ER at [facility] and advised to see the wound care for a consultation. No X-rays of workup was done during the ER visit and no prescription for any medications of compression wraps were given. the patient is not diabetic but does have hypertension and her medications have been reviewed by me. 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 The  right and left greater saphenous vein was found to be tortuous. Deep venous system was also not competent and there was reflux of more than 500 ms. She was then seen by Dr. Tawanna Cooler Early who recommended that the patient would not benefit from endovenous ablation and he had recommended vein stripping on the right side and multiple small phlebectomy procedures on the left side. the patient did not follow-up due to social economic reasons. She has not been wearing any compression stockings and has not taken any specific treatment for varicose veins for the last 3 years. 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 We have obtained some appointments to see the vascular surgeons by the end of the month and the patient would like to follow up with me at my [facility] on Wednesday, June 29. [name], VOONG ([id number]) 10/14/2014 -- she could not see me yesterday in [place] and hence has come for a review today. She has a vascular  workup to be done this afternoon at [facility]. She is doing fine otherwise. 10/22/2014 -- she was seen by Dr. [name] and he has recommended surgical removal of her right saphenous vein from distal thigh to saphenofemoral junction and stab phlebectomy's of multiple large tributary branches throughout her thigh and calf. This would be done under general anesthesia in the outpatient setting. 10/29/2014 -- she is trying to work on a surgical date and in the meanwhile we have got insurance clearance for Apligraf and we will start this next week. 7/22 2016 -- she is here for the first application of Apligraf. Objective Constitutional Pulse regular. Respirations normal and unlabored. Afebrile. Vitals Time Taken: 9:06 AM, Height: 66 in, Weight: 350.3 lbs, BMI: 56.5, Temperature: 98.0 F, Pulse: 68 bpm, Respiratory Rate: 16 breaths/min, Blood Pressure: 125/46 mmHg. Eyes Nonicteric. Reactive to light. Ears, Nose, Mouth, and Throat Lips, teeth, and gums WNL.Marland Kitchen Moist mucosa without lesions . Neck supple and nontender. No palpable supraclavicular or cervical adenopathy. Normal sized without goiter. Respiratory WNL. No retractions.. Cardiovascular Pedal Pulses WNL. No clubbing, cyanosis or edema. Chest Breasts symmetical and no nipple discharge.. Breast tissue WNL, no masses, lumps, or tenderness.. Lymphatic No adneopathy. No adenopathy. No adenopathy. Musculoskeletal Adexa without tenderness or enlargement.. Digits and nails w/o clubbing, cyanosis, infection, petechiae, ischemia, or inflammatory conditions.Marland Kitchen LEANORE, BIGGERS ([id number]) Psychiatric Judgement and insight Intact.. No evidence of depression, anxiety, or agitation.. General Notes: the right lateral malleolar wound looks clean with healthy granulation tissue and she is going to have the tissue was stopped with saline and a fresh Apligraf is to be applied today. Integumentary (Hair, Skin) No suspicious lesions. No crepitus or  fluctuance. No peri-wound warmth or erythema. No masses.. Wound #1 status is Open. Original cause of wound was Not Known. The wound is located on the Right,Lateral Malleolus. The wound measures 1.3cm length x 2cm width x 0.1cm depth; 2.042cm^2 area and 0.204cm^3 volume. The wound is limited to skin breakdown. There is no tunneling noted. There is a medium amount of serosanguineous drainage noted. The wound margin is distinct with the outline attached to the wound base. There is large (67-100%) red granulation within the wound bed. There is no necrotic tissue within the wound bed. The periwound skin appearance exhibited: Localized Edema, Moist, Hemosiderin Staining. The periwound skin appearance did not exhibit: Callus, Crepitus, Excoriation, Fluctuance, Friable, Induration, Rash, Scarring, Dry/Scaly, Maceration, Atrophie Blanche, Cyanosis, Ecchymosis, Mottled,  Pallor, Rubor, Erythema. Periwound temperature was noted as No Abnormality. The periwound has tenderness on palpation. 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 Procedures Wound #1 Wound #1 is a Venous Leg Ulcer located on the Right,Lateral Malleolus. A skin graft procedure using a bioengineered skin substitute/cellular or tissue based product was performed by [place], Ignacia [name]., MD. Apligraf was applied and secured with Steri-Strips. 3 sq cm of product was utilized and 41 sq cm was wasted due to wound size. Post Application, mepitel was applied. A Time Out was conducted prior to the start of the LILLIA, LENGEL. ([id number]) procedure. The procedure was tolerated well with a pain level of  0 throughout and a pain level of 0 following the procedure. Plan Wound Cleansing: Wound #1 Right,Lateral Malleolus: Clean wound with Normal Saline. Cleanse wound with mild soap and water Anesthetic: Wound #1 Right,Lateral Malleolus: Topical Lidocaine 4% cream applied to wound bed prior to debridement Secondary Dressing: Wound #1 Right,Lateral Malleolus: ABD pad - ABD and Drawtex to bolster Dressing Change Frequency: Wound #1 Right,Lateral Malleolus: Change dressing every week Follow-up Appointments: Return Appointment in 1 week. Edema Control: Wound #1 Right,Lateral Malleolus: Unna Boot to Right Lower Extremity Additional Orders / Instructions: Wound #1 Right,Lateral Malleolus: Increase protein intake. Advanced Therapies: Wound #1 Right,Lateral Malleolus: Apligraf application in clinic; including contact layer, fixation with steri strips, dry gauze and cover dressing. Using the usual sterile precautions the first application of Apligraf has been applied and she will have the appropriate dressing and an Unna's boot over this. Electronic Signature(s) Signed: 11/05/2014 9:42:05 AM By: Evlyn Kanner MD, FACS Entered By: Evlyn Kanner on 11/05/2014 09:42:05 Koepp, [name] ([id number]) -------------------------------------------------------------------------------- SuperBill Details Patient Name: BETHANIE, BLOXOM Date of Service: 11/05/2014 Medical Record Patient Account Number: [id number] Number: [name], RN, [name], Treating RN: 28-Oct-1985 (28 y.o. 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 E66.01 Morbid (severe) obesity due to excess  calories Facility Procedures CPT4 Code Description: 86578469 (Facility Use Only) Apligraf 1 SQ CM Modifier: Quantity: 44 CPT4 Code Description: [id number] 15275 - SKIN SUB GRAFT FACE/NK/HF/G ICD-10 Description Diagnosis I83.013 Varicose veins of right lower extremity with ulcer L97.312 Non-pressure chronic ulcer of right ankle with fat E66.01 Morbid (severe) obesity due to  excess calories Modifier: of ankle layer exposed Quantity: 1 Physician Procedures CPT4 Code Description: 3244010 15275 - WC PHYS SKIN SUB GRAFT FACE/NK/HF/G ICD-10 Description Diagnosis I83.013 Varicose veins of right lower extremity with ulcer o L97.312 Non-pressure chronic ulcer of right ankle with fat l E66.01 Morbid (severe)  obesity due to excess calories Modifier: f ankle ayer exposed Quantity: 1 Electronic Signature(s) Signed: 11/05/2014 9:42:18 AM By: Evlyn Kanner MD, FACS Entered By: Evlyn Kanner on 11/05/2014 09:42:18

## 2014-11-11 ENCOUNTER — Encounter: Payer: Self-pay | Admitting: General Surgery

## 2014-11-11 ENCOUNTER — Encounter (HOSPITAL_BASED_OUTPATIENT_CLINIC_OR_DEPARTMENT_OTHER): Payer: BLUE CROSS/BLUE SHIELD | Admitting: General Surgery

## 2014-11-11 DIAGNOSIS — I83014 Varicose veins of right lower extremity with ulcer of heel and midfoot: Secondary | ICD-10-CM

## 2014-11-11 DIAGNOSIS — I83019 Varicose veins of right lower extremity with ulcer of unspecified site: Secondary | ICD-10-CM

## 2014-11-11 DIAGNOSIS — I83013 Varicose veins of right lower extremity with ulcer of ankle: Secondary | ICD-10-CM | POA: Diagnosis not present

## 2014-11-11 DIAGNOSIS — L97919 Non-pressure chronic ulcer of unspecified part of right lower leg with unspecified severity: Secondary | ICD-10-CM

## 2014-11-11 DIAGNOSIS — I83011 Varicose veins of right lower extremity with ulcer of thigh: Secondary | ICD-10-CM | POA: Diagnosis not present

## 2014-11-11 DIAGNOSIS — I83018 Varicose veins of right lower extremity with ulcer other part of lower leg: Secondary | ICD-10-CM

## 2014-11-11 DIAGNOSIS — I83015 Varicose veins of right lower extremity with ulcer other part of foot: Secondary | ICD-10-CM

## 2014-11-11 DIAGNOSIS — I83012 Varicose veins of right lower extremity with ulcer of calf: Secondary | ICD-10-CM

## 2014-11-11 DIAGNOSIS — L97312 Non-pressure chronic ulcer of right ankle with fat layer exposed: Secondary | ICD-10-CM | POA: Diagnosis not present

## 2014-11-11 DIAGNOSIS — L97219 Non-pressure chronic ulcer of right calf with unspecified severity: Secondary | ICD-10-CM

## 2014-11-11 NOTE — Progress Notes (Signed)
See i heal 

## 2014-11-12 NOTE — Progress Notes (Signed)
SHAHAD, MAZUREK ([id number]) Visit Report for 11/11/2014 Arrival Information Details Patient Name: [name], [name]. Date of Service: 11/11/2014 9:15 AM Medical Record Number: [id number] Patient Account Number: [id number] Date of Birth/Sex: November 06, 1985 (29 y.o. Female) Treating RN: [name] Primary Care Physician: Felix Pacini Other Clinician: Referring Physician: Felix Pacini Treating Physician/Extender: Rudene Re in Treatment: 7 Visit Information History Since Last Visit Added or deleted any medications: No Patient Arrived: Ambulatory Any new allergies or adverse reactions: No Arrival Time: 09:41 Had a fall or experienced change in No Accompanied By: self activities of daily living that may affect Transfer Assistance: None risk of falls: Patient Identification Verified: Yes Signs or symptoms of abuse/neglect since last No Secondary Verification Process Yes visito Completed: Hospitalized since last visit: No Patient Has Alerts: Yes Has Dressing in Place as Prescribed: Yes Patient Alerts: 09/2013 ABI R: Pain Present Now: No 1.3 Electronic Signature(s) Signed: 11/12/2014 8:42:46 AM By: Elliot Gurney, RN, [name], [name], [name] Entered By: Elliot Gurney, RN, [name], Kim on 11/11/2014 09:41:33 Lull, [name] -------------------------------------------------------------------------------- Encounter Discharge Information Details Patient Name: [name], [name]. Date of Service: 11/11/2014 9:15 AM Medical Record Number: [id number] Patient Account Number: [id number] Date of Birth/Sex: 1985/05/10 (29 y.o. Female) Treating RN: [name] Primary Care Physician: Felix Pacini Other Clinician: Referring Physician: Felix Pacini Treating Physician/Extender: Rudene Re in Treatment: 7 Encounter Discharge Information Items Discharge Pain Level: 0 Discharge Condition: Stable Ambulatory Status: Ambulatory Discharge Destination:  Home Private Transportation: Auto Accompanied By: self Schedule Follow-up Appointment: Yes Medication Reconciliation completed and Yes provided to Patient/Care Duyen Beckom: Clinical Summary of Care: Electronic Signature(s) Signed: 11/12/2014 8:42:46 AM By: Elliot Gurney, RN, [name], [name], [name] Entered By: Elliot Gurney, RN, [name], Kim on 11/11/2014 10:12:15 Bearce, [name] ([id number]) -------------------------------------------------------------------------------- Lower Extremity Assessment Details Patient Name: [name], [name]. Date of Service: 11/11/2014 9:15 AM Medical Record Number: [id number] Patient Account Number: [id number] Date of Birth/Sex: 02-20-1986 (29 y.o. Female) Treating RN: [name] Primary Care Physician: Felix Pacini Other Clinician: Referring Physician: Felix Pacini Treating Physician/Extender: Rudene Re in Treatment: 7 Edema Assessment Assessed: [Left: No] [Right: No] E[Left: dema] [Right: :] Calf Left: Right: Point of Measurement: 34 cm From Medial Instep cm 57.3 cm Ankle Left: Right: Point of Measurement: 10 cm From Medial Instep cm 30 cm Vascular Assessment Pulses: Posterior Tibial Dorsalis Pedis Palpable: [Right:Yes] Extremity colors, hair growth, and conditions: Extremity Color: [Right:Hyperpigmented] Temperature of Extremity: [Right:Warm] Capillary Refill: [Right:< 3 seconds] Toe Nail Assessment Left: Right: Thick: No Discolored: No Deformed: No Improper Length and Hygiene: No Electronic Signature(s) Signed: 11/12/2014 8:42:46 AM By: Elliot Gurney, RN, [name], [name], [name] Entered By: Elliot Gurney, RN, [name], Kim on 11/11/2014 09:48:12 Moch, [name] ([id number]) -------------------------------------------------------------------------------- Multi-Disciplinary Care Plan Details Patient Name: [name], [name]. Date of Service: 11/11/2014 9:15 AM Medical Record Number: [id number] Patient Account Number: [id number] Date of Birth/Sex: October 27, 1985 (29 y.o.  Female) Treating RN: [name] Primary Care Physician: Felix Pacini Other Clinician: Referring Physician: Felix Pacini Treating Physician/Extender: Rudene Re in Treatment: 7 Active Inactive Abuse / Safety / Falls / Self Care Management Nursing Diagnoses: Potential for falls Goals: Patient will remain injury free Date Initiated: 09/20/2014 Goal Status: Active Interventions: Assess fall risk on admission and as needed Notes: Orientation to the Wound Care Program Nursing Diagnoses: Knowledge deficit related to the wound healing center program Goals: Patient/caregiver will verbalize understanding of the Wound Healing Center Program Date Initiated: 09/20/2014 Goal Status: Active Interventions: Provide education on orientation to the wound center Notes: Pain,  Acute or Chronic Nursing Diagnoses: Pain Management - Cyclic Acute (Dressing Change Related) Goals: Patient will verbalize adequate pain control and receive pain control interventions during procedures as needed GWENDOLINE, JUDY ([id number]) Date Initiated: 09/20/2014 Goal Status: Active Interventions: Complete pain assessment as per visit requirements Notes: Venous Leg Ulcer Nursing Diagnoses: Potential for venous Insuffiency (use before diagnosis confirmed) Goals: Non-invasive venous studies are completed as ordered Date Initiated: 09/20/2014 Goal Status: Active Interventions: Assess peripheral edema status every visit. Notes: Wound/Skin Impairment Nursing Diagnoses: Impaired tissue integrity Goals: Ulcer/skin breakdown will have a volume reduction of 30% by week 4 Date Initiated: 09/20/2014 Goal Status: Active Interventions: Assess ulceration(s) every visit Notes: Electronic Signature(s) Signed: 11/12/2014 8:42:46 AM By: Elliot Gurney, RN, [name], [name], [name] Entered By: Elliot Gurney, RN, [name], Kim on 11/11/2014 10:10:05 Amison, [name]  ([id number]) -------------------------------------------------------------------------------- Pain Assessment Details Patient Name: [name], [name]. Date of Service: 11/11/2014 9:15 AM Medical Record Number: [id number] Patient Account Number: [id number] Date of Birth/Sex: Oct 10, 1985 (29 y.o. Female) Treating RN: [name] Primary Care Physician: Felix Pacini Other Clinician: Referring Physician: Felix Pacini Treating Physician/Extender: Rudene Re in Treatment: 7 Active Problems Location of Pain Severity and Description of Pain Patient Has Paino No Site Locations Pain Management and Medication Current Pain Management: Electronic Signature(s) Signed: 11/12/2014 8:42:46 AM By: Elliot Gurney, RN, [name], [name], [name] Entered By: Elliot Gurney, RN, [name], Kim on 11/11/2014 09:41:38 Shostak, [name] ([id number]) -------------------------------------------------------------------------------- Patient/Caregiver Education Details Patient Name: [name], [name] Date of Service: 11/11/2014 9:15 AM Medical Record Number: [id number] Patient Account Number: [id number] Date of Birth/Gender: November 09, 1985 (28 y.o. Female) Treating RN: [name] Primary Care Physician: Felix Pacini Other Clinician: Referring Physician: Felix Pacini Treating Physician/Extender: Rudene Re in Treatment: 7 Education Assessment Education Provided To: Patient Education Topics Provided Wound/Skin Impairment: Handouts: Other: wound care as prescribed Electronic Signature(s) Signed: 11/12/2014 8:42:46 AM By: Elliot Gurney, RN, [name], [name], [name] Entered By: Elliot Gurney, RN, [name], Kim on 11/11/2014 10:12:32 [name] ([id number]) -------------------------------------------------------------------------------- Wound Assessment Details Patient Name: [name], [name]. Date of Service: 11/11/2014 9:15 AM Medical Record Number: [id number] Patient Account Number: [id number] Date of Birth/Sex: Jun 29, 1985 (28 y.o. Female) Treating RN:  [name] Primary Care Physician: Felix Pacini Other Clinician: Referring Physician: Felix Pacini Treating Physician/Extender: Rudene Re in Treatment: 7 Wound Status Wound Number: 1 Primary Etiology: Venous Leg Ulcer Wound Location: Right Malleolus - Lateral Wound Status: Open Wounding Event: Not Known Date Acquired: 09/06/2014 Weeks Of Treatment: 7 Clustered Wound: No Photos Photo Uploaded By: Elliot Gurney, RN, [name], Kim on 11/11/2014 11:30:50 Wound Measurements Length: (cm) 1.3 Width: (cm) 2 Depth: (cm) 0.1 Area: (cm) 2.042 Volume: (cm) 0.204 % Reduction in Area: 7.1% % Reduction in Volume: 69.1% Epithelialization: Small (1-33%) Wound Description Full Thickness Without Exposed Classification: Support Structures Wound Margin: Distinct, outline attached Exudate Medium Amount: Exudate Type: Serosanguineous Exudate Color: red, brown Foul Odor After Cleansing: No Wound Bed Granulation Amount: Large (67-100%) Exposed Structure Granulation Quality: Red Fascia Exposed: No Necrotic Amount: None Present (0%) Fat Layer Exposed: No Behunin, Grenda J. ([id number]) Tendon Exposed: No Muscle Exposed: No Joint Exposed: No Bone Exposed: No Limited to Skin Breakdown Periwound Skin Texture Texture Color No Abnormalities Noted: No No Abnormalities Noted: No Callus: No Atrophie Blanche: No Crepitus: No Cyanosis: No Excoriation: No Ecchymosis: No Fluctuance: No Erythema: No Friable: No Hemosiderin Staining: Yes Induration: No Mottled: No Localized Edema: No Pallor: No Rash: No Rubor: No Scarring: No Temperature / Pain Moisture Temperature: No Abnormality No Abnormalities Noted:  No Tenderness on Palpation: Yes Dry / Scaly: No Maceration: No Moist: Yes Wound Preparation Ulcer Cleansing: Other: soap and water, Topical Anesthetic Applied: Other: Lidocaine 4% Ointment , Assessment Notes Used measurements from last week. Unable to measure dt Apligraf  application. Wound covered with mepitel and steri-strips. Treatment Notes Wound #1 (Right, Lateral Malleolus) 1. Cleansed with: Cleanse wound with antibacterial soap and water 2. Anesthetic Topical Lidocaine 4% cream to wound bed prior to debridement 7. Secured with Henriette Combs to Right Lower Extremity Notes Drawtex over wound Electronic Signature(s) Signed: 11/12/2014 8:42:46 AM By: Elliot Gurney, RN, [name], [name], [name] Entered By: Elliot Gurney, RN, [name], Kim on 11/11/2014 09:50:33 Kleinert, [name] ([id number]) Kamm, [name] -------------------------------------------------------------------------------- Vitals Details Patient Name: [name], [name]. Date of Service: 11/11/2014 9:15 AM Medical Record Number: [id number] Patient Account Number: [id number] Date of Birth/Sex: 12-16-1985 (28 y.o. Female) Treating RN: [name] Primary Care Physician: Felix Pacini Other Clinician: Referring Physician: Felix Pacini Treating Physician/Extender: Rudene Re in Treatment: 7 Vital Signs Time Taken: 09:43 Temperature (F): 98.5 Height (in): 66 Respiratory Rate (breaths/min): 18 Weight (lbs): 350.3 Blood Pressure (mmHg): 132/80 Body Mass Index (BMI): 56.5 Reference Range: 80 - 120 mg / dl Electronic Signature(s) Signed: 11/12/2014 8:42:46 AM By: Elliot Gurney, RN, [name], [name], [name] Entered By: Elliot Gurney, RN, [name], Kim on 11/11/2014 09:44:17

## 2014-11-15 NOTE — Progress Notes (Signed)
MICOLE, DELEHANTY ([id number]) Visit Report for 11/11/2014 Physician Orders Details Patient Name: TRUTH, WOLAVER. Date of Service: 11/11/2014 9:15 AM Medical Record Number: [id number] Patient Account Number: [id number] Date of Birth/Sex: 1985-08-25 (28 y.o. Female) Treating RN: [name] Primary Care Physician: Felix Pacini Other Clinician: Referring Physician: Felix Pacini Treating Physician/Extender: Rudene Re in Treatment: 7 Verbal / Phone Orders: Yes Clinician: [name] Read Back and Verified: Yes Diagnosis Coding Wound Cleansing Wound #1 Right,Lateral Malleolus o Clean wound with Normal Saline. o Cleanse wound with mild soap and water Anesthetic Wound #1 Right,Lateral Malleolus o Topical Lidocaine 4% cream applied to wound bed prior to debridement Secondary Dressing Wound #1 Right,Lateral Malleolus o Drawtex Dressing Change Frequency Wound #1 Right,Lateral Malleolus o Change dressing every week Follow-up Appointments o Return Appointment in 1 week. Edema Control Wound #1 Right,Lateral Malleolus o Unna Boot to Right Lower Extremity Additional Orders / Instructions Wound #1 Right,Lateral Malleolus o Increase protein intake. Electronic Signature(s) Signed: 11/12/2014 8:42:46 AM By: Elliot Gurney RN, [name], [name], [name] Signed: 11/15/2014 12:31:06 PM By: Evlyn Kanner MD, FACS ALARA, DANIEL ([id number]) Entered By: Elliot Gurney RN, [name], Kim on 11/11/2014 10:10:46 Houp, [name] ([id number]) -------------------------------------------------------------------------------- SuperBill Details Patient Name: ANGELITA, HARNACK. Date of Service: 11/11/2014 Medical Record Number: [id number] Patient Account Number: [id number] Date of Birth/Sex: 06/09/1985 (28 y.o. 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

## 2014-11-18 ENCOUNTER — Ambulatory Visit: Payer: BLUE CROSS/BLUE SHIELD | Admitting: Surgery

## 2014-11-19 ENCOUNTER — Encounter: Payer: BLUE CROSS/BLUE SHIELD | Attending: Surgery | Admitting: Surgery

## 2014-11-19 DIAGNOSIS — L97312 Non-pressure chronic ulcer of right ankle with fat layer exposed: Secondary | ICD-10-CM | POA: Diagnosis not present

## 2014-11-19 DIAGNOSIS — I83013 Varicose veins of right lower extremity with ulcer of ankle: Secondary | ICD-10-CM | POA: Insufficient documentation

## 2014-11-20 NOTE — Progress Notes (Signed)
[name], [name] ([id number]) Visit Report for 11/19/2014 Chief Complaint Document Details Patient Name: [name], [name]. Date of Service: 11/19/2014 9:15 AM Medical Record Patient Account Number: [id number] Number: [name], Treating RN: 1985-10-01 (28 y.o. [place] Sink Date of Birth/Sex: Female) Other Clinician: Primary Care Physician: [name] Treating [name] Referring Physician: Felix [name] Physician/Extender: Weeks in Treatment: 8 Information Obtained from: Patient Chief Complaint Patient presents for treatment of an open ulcer due to venous insufficiency. this 29 year old patient has had an ulcer on the lateral part of her right lower extremity and this has been there for 2 weeks. Electronic Signature(s) Signed: 11/19/2014 10:27:14 AM By: [name] Kanner MD, FACS Entered By: [name] on 11/19/2014 10:27:14 [name], [name] ([id number]) -------------------------------------------------------------------------------- Cellular or Tissue Based Product Details Patient Name: [name]. Date of Service: 11/19/2014 9:15 AM Medical Record Patient Account Number: [id number] Number: [name], Treating RN: 01/07/1986 (28 y.o. [place] Sink Date of Birth/Sex: Female) Other Clinician: Primary Care Physician: [name] Treating [name] Referring Physician: Felix [name] Physician/Extender: Weeks in Treatment: 8 Cellular or Tissue Based Wound #1 Right,Lateral Malleolus Product Type Applied to: Performed By: Physician [name] Schroeder., MD Cellular or Tissue Based Apligraf Product Type: Time-Out Taken: Yes Location: genitalia / hands / feet / multiple digits Wound Size (sq cm): 2.34 Product Size (sq cm): 44 Waste Size (sq cm): 41 Waste Reason: wound size Amount of Product Applied (sq cm): 3 Lot #: [id number] Expiration Date: 11/20/2014 Fenestrated: Yes Instrument: Blade Reconstituted: Yes Solution Type: saline Solution Amount:  5ml Lot #: [id number] Solution Expiration 07/15/2016 Date: Secured: Yes Secured With: Steri-Strips Dressing Applied: Yes Primary Dressing: mepitel one Procedural Pain: 0 Post Procedural Pain: 0 Response to Treatment: Procedure was tolerated well Electronic Signature(s) Signed: 11/19/2014 10:27:08 AM By: [name] Kanner MD, FACS Entered By: [name] on 11/19/2014 10:27:08 [name] ([id number]) -------------------------------------------------------------------------------- HPI Details Patient Name: [name], [name] Date of Service: 11/19/2014 9:15 AM Medical Record Patient Account Number: [id number] Number: [name], Treating RN: 01-11-1986 (28 y.o.  Sink Date of Birth/Sex: Female) Other Clinician: Primary Care Physician: [name] Treating [name] Referring Physician: Felix [name] Physician/Extender: Weeks in Treatment: 8 History of Present Illness Location: the lateral part of her right ankle Quality: Patient reports experiencing a dull pain to affected area(s). Severity: Patient states wound are getting worse. Duration: Patient has had the wound for < 2 weeks prior to presenting for treatment Timing: Pain in wound is Intermittent (comes and goes Context: The wound appeared gradually over time Modifying Factors: Consults to this date include:seen in the ER and asked to follow up with wound care. Associated Signs and Symptoms: Patient reports having difficulty standing for long periods. HPI Description: 29 year old patient who started with having ulcerations on the right lower leg on the lateral part of her ankle for about 2 weeks. She was seen in the ER at [facility] and advised to see the wound care for a consultation. No X-rays of workup was done during the ER visit and no prescription for any medications of compression wraps were given. the patient is not diabetic but does have hypertension and her medications have been reviewed by me. 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 The right and left greater saphenous vein was found to be tortuous. Deep  venous system was also not competent and there was reflux of more than 500 ms. She was then seen by Dr. Tawanna Cooler [name] who recommended that the patient would not benefit from endovenous ablation and he had recommended vein stripping on the right side and multiple small phlebectomy procedures on the left side. the patient did not follow-up due to social economic reasons. She has not been wearing any compression stockings and has not taken any specific treatment for varicose veins for the last 3 years. 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 We have obtained some appointments to see the vascular surgeons by the end of the month and the patient would like to follow up with me at my [facility] on Wednesday, June 29. 10/14/2014 -- she could not see me yesterday in [place] and hence has come for a review today. She has a vascular workup to be done this afternoon at [facility]. She is doing fine otherwise. 10/22/2014 -- she was seen by [name] and he has recommended surgical removal of her right saphenous vein from distal thigh to saphenofemoral junction and stab phlebectomy's of multiple large tributary branches throughout her thigh and calf. This would be done under general anesthesia in the outpatient setting. 10/29/2014 -- she is trying to work on a surgical date and in the meanwhile we have got insurance Eichorn, Haswell J. ([id number]) clearance for Apligraf and we will start this next week. 7/22 2016 -- she is here for the first application of Apligraf. 11/19/2014 -- she is here for a second application of  Apligraf Electronic Signature(s) Signed: 11/19/2014 10:27:39 AM By: [name] Kanner MD, FACS Entered By: [name] on 11/19/2014 10:27:39 [name], [name] ([id number]) -------------------------------------------------------------------------------- Physical Exam Details Patient Name: [name], [name]. Date of Service: 11/19/2014 9:15 AM Medical Record Patient Account Number: [id number] Number: [name], Treating RN: 1985-12-27 (28 y.o. [place] Sink Date of Birth/Sex: Female) Other Clinician: Primary Care Physician: [name] Treating [name] Referring Physician: KUNEFF, [name] Physician/Extender: Weeks in Treatment: 8 Constitutional . Pulse regular. Respirations normal and unlabored. Afebrile. . Eyes Nonicteric. Reactive to light. Ears, Nose, Mouth, and Throat Lips, teeth, and gums WNL.Marland Kitchen Moist mucosa without lesions . Neck supple and nontender. No palpable supraclavicular or cervical adenopathy. Normal sized without goiter. Respiratory WNL. No retractions.. Cardiovascular Pedal Pulses WNL. No clubbing, cyanosis or edema. Chest Breasts symmetical and no nipple discharge.. Breast tissue WNL, no masses, lumps, or tenderness.. Lymphatic No adneopathy. No adenopathy. No adenopathy. Musculoskeletal Adexa without tenderness or enlargement.. Digits and nails w/o clubbing, cyanosis, infection, petechiae, ischemia, or inflammatory conditions.. Integumentary (Hair, Skin) No suspicious lesions. No crepitus or fluctuance. No peri-wound warmth or erythema. No masses.Marland Kitchen Psychiatric Judgement and insight Intact.. No evidence of depression, anxiety, or agitation.. Notes she is doing well and the wound looks clean and has the normal look of post Apligraf wound base. She is ready for the next application of Apligraf Electronic Signature(s) Signed: 11/19/2014 10:28:15 AM By: [name] Kanner MD, FACS Entered By: [name] on 11/19/2014 10:28:14 [name]  ([id number]) -------------------------------------------------------------------------------- Physician Orders Details Patient Name: [name], [name] Date of Service: 11/19/2014 9:15 AM Medical Record Number: [id number] Patient Account Number: [id number] Date of Birth/Sex: 1985-10-14 (28 y.o. Female) Treating RN: [name] Primary Care Physician: [name] Other Clinician: Referring Physician: Felix [name] Treating Physician/Extender: Rudene Re in Treatment: 8 Verbal / Phone Orders: Yes  Clinician: [name] Read Back and Verified: Yes Diagnosis Coding Wound Cleansing Wound #1 Right,Lateral Malleolus o Cleanse wound with mild soap and water Anesthetic Wound #1 Right,Lateral Malleolus o Topical Lidocaine 4% cream applied to wound bed prior to debridement Secondary Dressing o Drawtex Wound #1 Right,Lateral Malleolus o ABD pad Dressing Change Frequency Wound #1 Right,Lateral Malleolus o Change dressing every week Follow-up Appointments Wound #1 Right,Lateral Malleolus o Return Appointment in 1 week. Edema Control Wound #1 Right,Lateral Malleolus o Unna Boot to Right Lower Extremity Advanced Therapies Wound #1 Right,Lateral Malleolus o Apligraf application in clinic; including contact layer, fixation with steri strips, dry gauze and cover dressing. Electronic Signature(s) Signed: 11/19/2014 12:28:33 PM By: [name] Kanner MD, FACS Signed: 11/19/2014 4:15:34 PM By: Elliot Gurney RN, [name], [name], [name] [address] [place] ([id number]) Entered By: Elliot Gurney RN, [name], Kim on 11/19/2014 10:12:35 Iysis, Germain [name] ([id number]) -------------------------------------------------------------------------------- Problem List Details Patient Name: [name], [name]. Date of Service: 11/19/2014 9:15 AM Medical Record Patient Account Number: [id number] Number: [name], Treating RN: 02/28/1986 (28 y.o. 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o. [place] Sink Date of Birth/Sex: Female) Other Clinician: Primary Care Physician: [name] Treating [name] Referring Physician: Felix [name] Physician/Extender: Weeks in Treatment: 8 Subjective Chief Complaint Information obtained from Patient Patient presents for treatment of an open ulcer due to venous insufficiency. this 29 year old patient has had an ulcer on the lateral part of her right lower extremity and this has been there for 2 weeks. History of Present Illness (HPI) The following HPI elements were documented for the patient's wound: Location: the lateral part of her right ankle Quality: Patient reports experiencing a dull pain to affected area(s). Severity: Patient states wound are getting worse. Duration: Patient has had the wound for < 2 weeks prior to presenting for treatment Timing: Pain in wound is Intermittent (comes and goes Context: The wound appeared gradually over time Modifying Factors: Consults to this date include:seen in the ER and asked to follow  up with wound care. Associated Signs and Symptoms: Patient reports having difficulty standing for long periods. 29 year old patient who started with having ulcerations on the right lower leg on the lateral part of her ankle for about 2 weeks. She was seen in the ER at [facility] and advised to see the wound care for a consultation. No X-rays of workup was done during the ER visit and no prescription for any medications of compression wraps were given. the patient is not diabetic but does have hypertension and her medications have been reviewed by me. 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 The right and left greater  saphenous vein was found to be tortuous. Deep venous system was also not competent and there was reflux of more than 500 ms. She was then seen by Dr. Tawanna Cooler [name] who recommended that the patient would not benefit from endovenous ablation and he had recommended vein stripping on the right side and multiple small phlebectomy procedures on the left side. the patient did not follow-up due to social economic reasons. She has not been wearing any compression stockings and has not taken any specific treatment for varicose veins for the last 3 years. 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 We have obtained some appointments to see the vascular surgeons by the end of the month and the patient would like to follow up with me at my [facility] on Wednesday, June 29. [name], [name] ([id number]) 10/14/2014 -- she could not see me yesterday in [place] and hence has come for a review today. She has a vascular workup to be done this afternoon at [facility]. She is doing fine otherwise. 10/22/2014 -- she was seen by  [name] and he has recommended surgical removal of her right saphenous vein from distal thigh to saphenofemoral junction and stab phlebectomy's of multiple large tributary branches throughout her thigh and calf. This would be done under general anesthesia in the outpatient setting. 10/29/2014 -- she is trying to work on a surgical date and in the meanwhile we have got insurance clearance for Apligraf and we will start this next week. 7/22 2016 -- she is here for the first application of Apligraf. 11/19/2014 -- she is here for a second application of Apligraf Objective Constitutional Pulse regular. Respirations normal and unlabored. Afebrile. Vitals Time Taken: 9:32 AM, Height: 66 in, Weight: 350.3 lbs, BMI: 56.5, Temperature: 98.2 F, Pulse: 76 bpm, Respiratory Rate: 17 breaths/min, Blood Pressure: 128/76 mmHg. Eyes Nonicteric. Reactive to light. Ears, Nose, Mouth, and Throat Lips, teeth, and gums WNL.Marland Kitchen Moist mucosa without lesions . Neck supple and nontender. No palpable supraclavicular or cervical adenopathy. Normal sized without goiter. Respiratory WNL. No retractions.. Cardiovascular Pedal Pulses WNL. No clubbing, cyanosis or edema. Chest Breasts symmetical and no nipple discharge.. Breast tissue WNL, no masses, lumps, or tenderness.. Lymphatic No adneopathy. No adenopathy. No adenopathy. Musculoskeletal Adexa without tenderness or enlargement.. Digits and nails w/o clubbing, cyanosis, infection, petechiae, [name], [name] ([id number]) ischemia, or inflammatory conditions.Marland Kitchen Psychiatric Judgement and insight Intact.. No evidence of depression, anxiety, or agitation.. General Notes: she is doing well and the wound looks clean and has the normal look of post Apligraf wound base. She is ready for the next application of Apligraf Integumentary (Hair, Skin) No suspicious lesions. No crepitus or fluctuance. No peri-wound warmth or erythema. No masses.. Wound #1 status is  Open. Original cause of wound was Not Known. The wound is located on the Right,Lateral Malleolus. The wound measures 1.8cm length x 1.3cm width x 0.1cm depth; 1.838cm^2 area and 0.184cm^3 volume. The wound is limited to skin breakdown. There is no tunneling or undermining noted. There is a medium amount of serosanguineous drainage noted. The wound margin is distinct with the outline attached to the wound base. There is large (67-100%) red granulation within the wound bed. There is no necrotic tissue within the wound bed. The periwound skin appearance exhibited: Moist, Hemosiderin Staining. The periwound skin appearance did not exhibit: Callus, Crepitus, Excoriation, Fluctuance, Friable, Induration, Localized Edema, Rash, Scarring, Dry/Scaly, Maceration,  Atrophie Blanche, Cyanosis, Ecchymosis, Mottled, Pallor, Rubor, Erythema. Periwound temperature was noted as No Abnormality. The periwound has tenderness on palpation. 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 She will come back and see me next week for a wound check. Her open varicose vein surgery is scheduled for the end of the month. Procedures Wound #1 ALVERIA, MCGLAUGHLIN ([id number]) Wound #1 is a Venous Leg Ulcer located on the Right,Lateral Malleolus. A skin graft procedure using a bioengineered skin substitute/cellular or tissue based product was performed by Chicquita Mendel, Ignacia [name]., MD. Apligraf was applied and secured with Steri-Strips. 3 sq cm of product was utilized and 41 sq cm was wasted due to wound size. Post Application, mepitel one was applied. A Time Out was conducted prior to the start of the procedure. The procedure was tolerated well with a pain level of 0  throughout and a pain level of 0 following the procedure. Plan Wound Cleansing: Wound #1 Right,Lateral Malleolus: Cleanse wound with mild soap and water Anesthetic: Wound #1 Right,Lateral Malleolus: Topical Lidocaine 4% cream applied to wound bed prior to debridement Secondary Dressing: Drawtex Wound #1 Right,Lateral Malleolus: ABD pad Dressing Change Frequency: Wound #1 Right,Lateral Malleolus: Change dressing every week Follow-up Appointments: Wound #1 Right,Lateral Malleolus: Return Appointment in 1 week. Edema Control: Wound #1 Right,Lateral Malleolus: Unna Boot to Right Lower Extremity Advanced Therapies: Wound #1 Right,Lateral Malleolus: Apligraf application in clinic; including contact layer, fixation with steri strips, dry gauze and cover dressing. She is here to continue with the second application of Apligraf and an Unna's boot. She will come back and see me next week for a wound check. Her open varicose vein surgery is scheduled for the end of the month. Electronic Signature(s) SONOMA, FIRKUS ([id number]) Signed: 11/19/2014 10:29:31 AM By: [name] Kanner MD, FACS Entered By: [name] on 11/19/2014 10:29:31 GWYNNETH, FABIO ([id number]) -------------------------------------------------------------------------------- SuperBill Details Patient Name: ARNETRA, TERRIS. Date of Service: 11/19/2014 Medical Record Patient Account Number: [id number] Number: [name], Treating RN: [birth year] (28 y.o. 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 E66.01 Morbid (severe) obesity due to excess calories Facility  Procedures CPT4 Code: 08657846 Description: (Facility Use Only) Apligraf 1 SQ CM ICD-10 Description Diagnosis I83.013 Varicose veins of right lower extremity with ulcer Modifier: of ankle Quantity: 44 CPT4 Code: 96295284 Description: 15275 - SKIN SUB GRAFT FACE/NK/HF/G ICD-10 Description Diagnosis I83.013 Varicose veins of right lower extremity with ulcer Modifier: of ankle Quantity: 1 Physician Procedures CPT4 Code: 1324401 Description: 15275 - WC PHYS SKIN SUB GRAFT FACE/NK/HF/G ICD-10 Description Diagnosis I83.013 Varicose veins of right lower extremity with ulcer o Modifier: f ankle Quantity: 1 Electronic Signature(s) Signed: 11/19/2014 10:30:03 AM By: [name] Kanner MD, FACS Entered By: [name] on 11/19/2014 10:30:03

## 2014-11-20 NOTE — Progress Notes (Signed)
[name], [name] ([id number]) Visit Report for 11/19/2014 Arrival Information Details Patient Name: [name], [name]. Date of Service: 11/19/2014 9:15 AM Medical Record Number: [id number] Patient Account Number: [id number] Date of Birth/Sex: April 06, 1986 (28 y.o. Female) Treating RN: [name], RN, [name], [place] Sink Primary Care Physician: Felix Pacini Other Clinician: Referring Physician: Felix Pacini Treating Physician/Extender: Rudene Re in Treatment: 8 Visit Information History Since Last Visit Added or deleted any medications: No Patient Arrived: Ambulatory Any new allergies or adverse reactions: No Arrival Time: 09:28 Had a fall or experienced change in No Accompanied By: self activities of daily living that may affect Transfer Assistance: None risk of falls: Patient Identification Verified: Yes Signs or symptoms of abuse/neglect since last No Secondary Verification Process Yes visito Completed: Hospitalized since last visit: No Patient Has Alerts: Yes Has Dressing in Place as Prescribed: Yes Patient Alerts: 09/2013 ABI R: Has Compression in Place as Prescribed: Yes 1.3 Pain Present Now: No Electronic Signature(s) Signed: 11/19/2014 3:17:20 PM By: Elpidio Eric [name], RN Entered By: Elpidio Eric on 11/19/2014 15:17:20 Mccosh, [name] ([id number]) -------------------------------------------------------------------------------- Encounter Discharge Information Details Patient Name: [name], [name]. Date of Service: 11/19/2014 9:15 AM Medical Record Number: [id number] Patient Account Number: [id number] Date of Birth/Sex: 1985-07-19 (28 y.o. Female) Treating RN: [name], RN, [name], [place] Sink Primary Care Physician: Felix Pacini Other Clinician: Referring Physician: Felix Pacini Treating Physician/Extender: Rudene Re in Treatment: 8 Encounter Discharge Information Items Discharge Pain Level: 0 Discharge Condition: Stable Ambulatory Status: Ambulatory Discharge Destination:  Home Private Transportation: Auto Accompanied By: self Schedule Follow-up Appointment: No Medication Reconciliation completed and No provided to Patient/Care Ferlin Fairhurst: Clinical Summary of Care: Electronic Signature(s) Signed: 11/19/2014 3:18:28 PM By: Elpidio Eric [name], RN Entered By: Elpidio Eric on 11/19/2014 15:18:28 Rickey, [name] ([id number]) -------------------------------------------------------------------------------- Lower Extremity Assessment Details Patient Name: [name], [name] Date of Service: 11/19/2014 9:15 AM Medical Record Number: [id number] Patient Account Number: [id number] Date of Birth/Sex: 1986/03/15 (28 y.o. Female) Treating RN: [name], RN, [name], [place] Sink Primary Care Physician: Felix Pacini Other Clinician: Referring Physician: KUNEFF, RENEE Treating Physician/Extender: Rudene Re in Treatment: 8 Edema Assessment Assessed: [Left: No] [Right: No] E[Left: dema] [Right: :] Calf Left: Right: Point of Measurement: 34 cm From Medial Instep cm 57 cm Ankle Left: Right: Point of Measurement: 14 cm From Medial Instep cm 30 cm Vascular Assessment Pulses: Posterior Tibial Dorsalis Pedis Palpable: [Right:Yes] Extremity colors, hair growth, and conditions: Extremity Color: [Right:Hyperpigmented] Hair Growth on Extremity: [Right:No] Temperature of Extremity: [Right:Warm] Capillary Refill: [Right:< 3 seconds] Toe Nail Assessment Left: Right: Thick: No Discolored: No Deformed: No Improper Length and Hygiene: No Electronic Signature(s) Signed: 11/19/2014 3:31:27 PM By: Elpidio Eric [name], RN Entered By: Elpidio Eric on 11/19/2014 09:37:00 [name], [name] ([id number]) -------------------------------------------------------------------------------- Multi Wound Chart Details Patient Name: [name], [name] Date of Service: 11/19/2014 9:15 AM Medical Record Number: [id number] Patient Account Number: [id number] Date of Birth/Sex: [birth year] (28 y.o.  Female) Treating RN: [name] Primary Care Physician: Felix Pacini Other Clinician: Referring Physician: Felix Pacini Treating Physician/Extender: Rudene Re in Treatment: 8 Vital Signs Height(in): 66 Pulse(bpm): 76 Weight(lbs): 350.3 Blood Pressure 128/76 (mmHg): Body Mass Index(BMI): 57 Temperature(F): 98.2 Respiratory Rate 17 (breaths/min): Photos: [1:No Photos] [N/A:N/A] Wound Location: [1:Right Malleolus - Lateral] [N/A:N/A] Wounding Event: [1:Not Known] [N/A:N/A] Primary Etiology: [1:Venous Leg Ulcer] [N/A:N/A] Date Acquired: [1:09/06/2014] [N/A:N/A] Weeks of Treatment: [1:8] [N/A:N/A] Wound Status: [1:Open] [N/A:N/A] Measurements L x W x D 1.8x1.3x0.1 [N/A:N/A] (cm) Area (cm) : [1:1.838] [N/A:N/A] Volume (cm) : [1:0.184] [N/A:N/A] %  Reduction in Area: [1:16.40%] [N/A:N/A] % Reduction in Volume: 72.10% [N/A:N/A] Classification: [1:Full Thickness Without Exposed Support Structures] [N/A:N/A] Exudate Amount: [1:Medium] [N/A:N/A] Exudate Type: [1:Serosanguineous] [N/A:N/A] Exudate Color: [1:red, brown] [N/A:N/A] Wound Margin: [1:Distinct, outline attached] [N/A:N/A] Granulation Amount: [1:Large (67-100%)] [N/A:N/A] Granulation Quality: [1:Red] [N/A:N/A] Necrotic Amount: [1:None Present (0%)] [N/A:N/A] Exposed Structures: [1:Fascia: No Fat: No Tendon: No Muscle: No Joint: No Bone: No] [N/A:N/A] Limited to Skin Breakdown Epithelialization: Small (1-33%) N/A N/A Periwound Skin Texture: Edema: No N/A N/A Excoriation: No Induration: No Callus: No Crepitus: No Fluctuance: No Friable: No Rash: No Scarring: No Periwound Skin Moist: Yes N/A N/A Moisture: Maceration: No Dry/Scaly: No Periwound Skin Color: Hemosiderin Staining: Yes N/A N/A Atrophie Blanche: No Cyanosis: No Ecchymosis: No Erythema: No Mottled: No Pallor: No Rubor: No Temperature: No Abnormality N/A N/A Tenderness on Yes N/A N/A Palpation: Wound Preparation: Ulcer  Cleansing: Other: N/A N/A soap and water Topical Anesthetic Applied: Other: Lidocaine 4% Ointment Treatment Notes Electronic Signature(s) Signed: 11/19/2014 4:15:34 PM By: Elliot Gurney, RN, [name], [name], [name] Entered By: Elliot Gurney, RN, [name], Kim on 11/19/2014 10:05:48 [name] ([id number]) -------------------------------------------------------------------------------- Multi-Disciplinary Care Plan Details Patient Name: [name], [name]. Date of Service: 11/19/2014 9:15 AM Medical Record Number: [id number] Patient Account Number: [id number] Date of Birth/Sex: 1985/09/17 (28 y.o. Female) Treating RN: [name] Primary Care Physician: Felix Pacini Other Clinician: Referring Physician: Felix Pacini Treating Physician/Extender: Rudene Re in Treatment: 8 Active Inactive Abuse / Safety / Falls / Self Care Management Nursing Diagnoses: Potential for falls Goals: Patient will remain injury free Date Initiated: 09/20/2014 Goal Status: Active Interventions: Assess fall risk on admission and as needed Notes: Orientation to the Wound Care Program Nursing Diagnoses: Knowledge deficit related to the wound healing center program Goals: Patient/caregiver will verbalize understanding of the Wound Healing Center Program Date Initiated: 09/20/2014 Goal Status: Active Interventions: Provide education on orientation to the wound center Notes: Pain, Acute or Chronic Nursing Diagnoses: Pain Management - Cyclic Acute (Dressing Change Related) Goals: Patient will verbalize adequate pain control and receive pain control interventions during procedures as needed DWIGHT, BURDO ([id number]) Date Initiated: 09/20/2014 Goal Status: Active Interventions: Complete pain assessment as per visit requirements Notes: Venous Leg Ulcer Nursing Diagnoses: Potential for venous Insuffiency (use before diagnosis confirmed) Goals: Non-invasive venous studies are completed as ordered Date Initiated:  09/20/2014 Goal Status: Active Interventions: Assess peripheral edema status every visit. Notes: Wound/Skin Impairment Nursing Diagnoses: Impaired tissue integrity Goals: Ulcer/skin breakdown will have a volume reduction of 30% by week 4 Date Initiated: 09/20/2014 Goal Status: Active Interventions: Assess ulceration(s) every visit Notes: Electronic Signature(s) Signed: 11/19/2014 4:15:34 PM By: Elliot Gurney, RN, [name], [name], [name] Entered By: Elliot Gurney, RN, [name], Kim on 11/19/2014 10:05:40 Viruet, [name] ([id number]) -------------------------------------------------------------------------------- Pain Assessment Details Patient Name: [name], [name]. Date of Service: 11/19/2014 9:15 AM Medical Record Number: [id number] Patient Account Number: [id number] Date of Birth/Sex: [birth year] (28 y.o. Female) Treating RN: [name], RN, [name], [place] Sink Primary Care Physician: Felix Pacini Other Clinician: Referring Physician: Felix Pacini Treating Physician/Extender: Rudene Re in Treatment: 8 Active Problems Location of Pain Severity and Description of Pain Patient Has Paino No Site Locations Pain Management and Medication Current Pain Management: Electronic Signature(s) Signed: 11/19/2014 3:31:27 PM By: Elpidio Eric [name], RN Entered By: Elpidio Eric on 11/19/2014 09:36:05 [name], [name] ([id number]) -------------------------------------------------------------------------------- Patient/Caregiver Education Details Patient Name: DARLETH, EUSTACHE Date of Service: 11/19/2014 9:15 AM Medical Record Number: [id number] Patient Account Number: [id number] Date of Birth/Gender: 1986/01/16 (28 y.o. Female)  Treating RN: Clover Mealy, RN, [name], [employer] Primary Care Physician: Felix Pacini Other Clinician: Clover Mealy, RN, [name], [place] Sink Referring Physician: Felix Pacini Treating Physician/Extender: Rudene Re in Treatment: 8 Education Assessment Education Provided To: Patient Education Topics Provided Welcome To The  Wound Care Center: Methods: Explain/Verbal Responses: State content correctly Electronic Signature(s) Signed: 11/19/2014 3:18:43 PM By: Elpidio Eric [name], RN Entered By: Elpidio Eric on 11/19/2014 15:18:43 Meidinger, [name] ([id number]) -------------------------------------------------------------------------------- Wound Assessment Details Patient Name: [name], [name]. Date of Service: 11/19/2014 9:15 AM Medical Record Number: [id number] Patient Account Number: [id number] Date of Birth/Sex: 1985/08/29 (28 y.o. Female) Treating RN: [name], RN, [name], Psychologist, clinical Primary Care Physician: Felix Pacini Other Clinician: Referring Physician: KUNEFF, RENEE Treating Physician/Extender: Rudene Re in Treatment: 8 Wound Status Wound Number: 1 Primary Etiology: Venous Leg Ulcer Wound Location: Right Malleolus - Lateral Wound Status: Open Wounding Event: Not Known Date Acquired: 09/06/2014 Weeks Of Treatment: 8 Clustered Wound: No Photos Photo Uploaded By: Elpidio Eric on 11/19/2014 12:36:44 Wound Measurements Length: (cm) 1.8 Width: (cm) 1.3 Depth: (cm) 0.1 Area: (cm) 1.838 Volume: (cm) 0.184 % Reduction in Area: 16.4% % Reduction in Volume: 72.1% Epithelialization: Small (1-33%) Tunneling: No Undermining: No Wound Description Full Thickness Without Exposed Classification: Support Structures Wound Margin: Distinct, outline attached Exudate Medium Amount: Exudate Type: Serosanguineous Exudate Color: red, brown Foul Odor After Cleansing: No Wound Bed Granulation Amount: Large (67-100%) Exposed Structure Granulation Quality: Red Fascia Exposed: No Necrotic Amount: None Present (0%) Fat Layer Exposed: No Michiels, [name] ([id number]) Tendon Exposed: No Muscle Exposed: No Joint Exposed: No Bone Exposed: No Limited to Skin Breakdown Periwound Skin Texture Texture Color No Abnormalities Noted: No No Abnormalities Noted: No Callus: No Atrophie Blanche: No Crepitus:  No Cyanosis: No Excoriation: No Ecchymosis: No Fluctuance: No Erythema: No Friable: No Hemosiderin Staining: Yes Induration: No Mottled: No Localized Edema: No Pallor: No Rash: No Rubor: No Scarring: No Temperature / Pain Moisture Temperature: No Abnormality No Abnormalities Noted: No Tenderness on Palpation: Yes Dry / Scaly: No Maceration: No Moist: Yes Wound Preparation Ulcer Cleansing: Other: soap and water, Topical Anesthetic Applied: Other: Lidocaine 4% Ointment , Treatment Notes Wound #1 (Right, Lateral Malleolus) 1. Cleansed with: Cleanse wound with antibacterial soap and water 3. Peri-wound Care: Moisturizing lotion Skin Prep 4. Dressing Applied: Other dressing (specify in notes) 7. Secured with [name] to Right Lower Extremity Notes Drawtex over wound APligraf applied by MD Electronic Signature(s) Signed: 11/19/2014 3:31:27 PM By: Elpidio Eric [name], RN Entered By: Elpidio Eric on 11/19/2014 09:43:43 [name], [name] ([id number]) [name], [name] ([id number]) -------------------------------------------------------------------------------- Vitals Details Patient Name: [name], [name] Date of Service: 11/19/2014 9:15 AM Medical Record Number: [id number] Patient Account Number: [id number] Date of Birth/Sex: 18-Jan-1986 (28 y.o. Female) Treating RN: [name], RN, [name], Rita Primary Care Physician: Felix Pacini Other Clinician: Referring Physician: KUNEFF, RENEE Treating Physician/Extender: Rudene Re in Treatment: 8 Vital Signs Time Taken: 09:32 Temperature (F): 98.2 Height (in): 66 Pulse (bpm): 76 Weight (lbs): 350.3 Respiratory Rate (breaths/min): 17 Body Mass Index (BMI): 56.5 Blood Pressure (mmHg): 128/76 Reference Range: 80 - 120 mg / dl Electronic Signature(s) Signed: 11/19/2014 3:31:27 PM By: Elpidio Eric [name], RN Entered By: Elpidio Eric on 11/19/2014 53:66:44

## 2014-11-26 ENCOUNTER — Encounter: Payer: BLUE CROSS/BLUE SHIELD | Admitting: Surgery

## 2014-11-26 DIAGNOSIS — L97312 Non-pressure chronic ulcer of right ankle with fat layer exposed: Secondary | ICD-10-CM | POA: Diagnosis not present

## 2014-11-27 NOTE — Progress Notes (Signed)
CLAIRESSA, BOULET ([id number]) Visit Report for 11/26/2014 Chief Complaint Document Details Patient Name: [name], [name]. Date of Service: 11/26/2014 8:45 AM Medical Record Patient Account Number: [id number] Number: [name], RN, [name], Treating RN: 05-22-1985 (29 y.o. [place] Sink Date of Birth/Sex: Female) Other Clinician: Primary Care Physician: KUNEFF, RENEE Treating Evlyn Kanner Referring Physician: Felix Pacini Physician/Extender: Weeks in Treatment: 9 Information Obtained from: Patient Chief Complaint Patient presents for treatment of an open ulcer due to venous insufficiency. this 29 year old patient has had an ulcer on the lateral part of her right lower extremity and this has been there for 2 weeks. Electronic Signature(s) Signed: 11/26/2014 9:31:16 AM By: Evlyn Kanner MD, FACS Entered By: Evlyn Kanner on 11/26/2014 09:31:16 Zupko, [name] -------------------------------------------------------------------------------- HPI Details Patient Name: [name], [name]. Date of Service: 11/26/2014 8:45 AM Medical Record Patient Account Number: [id number] Number: [name], RN, [name], Treating RN: 1985/10/03 (29 y.o. [place] Sink Date of Birth/Sex: Female) Other Clinician: Primary Care Physician: KUNEFF, RENEE Treating Evlyn Kanner Referring Physician: Felix Pacini Physician/Extender: Weeks in Treatment: 9 History of Present Illness Location: the lateral part of her right ankle Quality: Patient reports experiencing a dull pain to affected area(s). Severity: Patient states wound are getting worse. Duration: Patient has had the wound for < 2 weeks prior to presenting for treatment Timing: Pain in wound is Intermittent (comes and goes Context: The wound appeared gradually over time Modifying Factors: Consults to this date include:seen in the ER and asked to follow up with wound care. Associated Signs and Symptoms: Patient reports having difficulty standing for  long periods. HPI Description: 29 year old patient who started with having ulcerations on the right lower leg on the lateral part of her ankle for about 2 weeks. She was seen in the ER at [facility] and advised to see the wound care for a consultation. No X-rays of workup was done during the ER visit and no prescription for any medications of compression wraps were given. the patient is not diabetic but does have hypertension and her medications have been reviewed by me. 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 The right and left greater saphenous vein was found to be tortuous. Deep venous system was also not competent and there was reflux of more than 500 ms. She was then seen by Dr. Tawanna Cooler Early who recommended that the patient would not benefit from endovenous ablation and he had recommended vein stripping on the right side and multiple small phlebectomy procedures on the left side. the patient did not follow-up due to social economic reasons. She has not been wearing any compression stockings and has not taken any specific treatment for varicose veins for the last 3 years. 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 We have obtained some appointments to see the vascular surgeons by the end of the month and the patient would like to follow up with me at my [facility] on Wednesday, June 29. 10/14/2014 -- she could not see me yesterday in [place] and hence has come for a review today. She has a vascular workup to be done this afternoon at [facility]. She is doing fine otherwise. 10/22/2014 -- she was seen by Dr. [name] and he has recommended surgical removal of  her right saphenous vein from distal thigh to  saphenofemoral junction and stab phlebectomy's of multiple large tributary branches throughout her thigh and calf. This would be done under general anesthesia in the outpatient setting. 10/29/2014 -- she is trying to work on a surgical date and in the meanwhile we have got insurance Simeone, Eupora J. ([id number]) clearance for Apligraf and we will start this next week. 7/22 2016 -- she is here for the first application of Apligraf. 11/19/2014 -- she is here for a second application of Apligraf 11/26/2014 -- she has done fine after her last application of Apligraf and is awaiting her surgery which is scheduled for August 31. Electronic Signature(s) Signed: 11/26/2014 9:31:54 AM By: Evlyn Kanner MD, FACS Entered By: Evlyn Kanner on 11/26/2014 09:31:54 Hollerbach, [name] -------------------------------------------------------------------------------- Physical Exam Details Patient Name: [name], [name]. Date of Service: 11/26/2014 8:45 AM Medical Record Patient Account Number: [id number] Number: [name], RN, [name], Treating RN: 05/25/1985 (29 y.o. [place] Sink Date of Birth/Sex: Female) Other Clinician: Primary Care Physician: KUNEFF, RENEE Treating Lemuel Boodram Referring Physician: KUNEFF, RENEE Physician/Extender: Weeks in Treatment: 9 Constitutional . Pulse regular. Respirations normal and unlabored. Afebrile. . Eyes Nonicteric. Reactive to light. Ears, Nose, Mouth, and Throat Lips, teeth, and gums WNL.Marland Kitchen Moist mucosa without lesions . Neck supple and nontender. No palpable supraclavicular or cervical adenopathy. Normal sized without goiter. Respiratory WNL. No retractions.. Cardiovascular Pedal Pulses WNL. No clubbing, cyanosis or edema. Lymphatic No adneopathy. No adenopathy. No adenopathy. Musculoskeletal Adexa without tenderness or enlargement.. Digits and nails w/o clubbing, cyanosis, infection, petechiae, ischemia, or inflammatory conditions.. Integumentary  (Hair, Skin) No suspicious lesions. No crepitus or fluctuance. No peri-wound warmth or erythema. No masses.Marland Kitchen Psychiatric Judgement and insight Intact.. No evidence of depression, anxiety, or agitation.. Notes the wound looks good and the surrounding skin and does not have any cellulitis or ulceration. Electronic Signature(s) Signed: 11/26/2014 9:32:23 AM By: Evlyn Kanner MD, FACS Entered By: Evlyn Kanner on 11/26/2014 09:32:22 [name] ([id number]) -------------------------------------------------------------------------------- Physician Orders Details Patient Name: CERA, RORKE Date of Service: 11/26/2014 8:45 AM Medical Record Number: [id number] Patient Account Number: [id number] Date of Birth/Sex: 20-Feb-1986 (28 y.o. Female) Treating RN: [name] Primary Care Physician: Felix Pacini Other Clinician: Referring Physician: Felix Pacini Treating Physician/Extender: Rudene Re in Treatment: 9 Verbal / Phone Orders: Yes Clinician: [name] Read Back and Verified: Yes Diagnosis Coding Wound Cleansing Wound #1 Right,Lateral Malleolus o Cleanse wound with mild soap and water Anesthetic Wound #1 Right,Lateral Malleolus o Topical Lidocaine 4% cream applied to wound bed prior to debridement Primary Wound Dressing Wound #1 Right,Lateral Malleolus o Drawtex Secondary Dressing Wound #1 Right,Lateral Malleolus o ABD pad Dressing Change Frequency Wound #1 Right,Lateral Malleolus o Change dressing every week Follow-up Appointments Wound #1 Right,Lateral Malleolus o Return Appointment in 1 week. Edema Control Wound #1 Right,Lateral Malleolus o Unna Boot to Right Lower Extremity Notes Order Apligraf (#3) for next visit. Electronic Signature(s) Signed: 11/26/2014 12:28:48 PM By: Evlyn Kanner MD, FACS Signed: 11/26/2014 12:39:36 PM By: Elliot Gurney RN, [name], [name], [name] [address], [place] ([id number]) Entered By: Elliot Gurney RN, [name], Kim on 11/26/2014  09:30:32 Skare, [name] ([id number]) -------------------------------------------------------------------------------- Problem List Details Patient Name: GRISELL, BISSETTE. Date of Service: 11/26/2014 8:45 AM Medical Record Patient Account Number: [id number] Number: [name], RN, [name], Treating RN: 07-31-1985 (28 y.o. 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 [place] Sink Date of Birth/Sex: Female) Other Clinician: Primary Care Physician: KUNEFF, RENEE Treating Evlyn Kanner Referring Physician: Felix Pacini Physician/Extender: Weeks in Treatment: 9 Subjective Chief Complaint Information obtained from Patient Patient presents for treatment of an open ulcer due to venous insufficiency. this 29 year old patient has had an ulcer on the lateral part of her right lower extremity and this has been there for 2 weeks. History of Present Illness (HPI) The following HPI elements were documented for the patient's wound: Location: the lateral part of her right  ankle Quality: Patient reports experiencing a dull pain to affected area(s). Severity: Patient states wound are getting worse. Duration: Patient has had the wound for < 2 weeks prior to presenting for treatment Timing: Pain in wound is Intermittent (comes and goes Context: The wound appeared gradually over time Modifying Factors: Consults to this date include:seen in the ER and asked to follow up with wound care. Associated Signs and Symptoms: Patient reports having difficulty standing for long periods. 29 year old patient who started with having ulcerations on the right lower leg on the lateral part of her ankle for about 2 weeks. She was seen in the ER at [facility] and advised to see the wound care for a consultation. No X-rays of workup was done during the ER visit and no prescription for any medications of compression wraps were given. the patient is not diabetic but does have hypertension and her medications have been reviewed by me. 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 The right and left greater saphenous vein was found to be tortuous. Deep venous system was also not competent and there was reflux of more than 500 ms. She was then seen by Dr. Tawanna Cooler Early who recommended that the patient would not benefit from endovenous ablation and he had recommended vein stripping on the right side and multiple small phlebectomy procedures on the left side. the patient did not follow-up due to social economic reasons. She has not been wearing any compression stockings and has not taken any specific treatment for varicose veins for the last 3 years. 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 We have obtained some appointments to  see the vascular surgeons by the end of the month and the patient would like to follow up with me at my [facility] on Wednesday, June 29. KRISTIAN, HAZZARD ([id number]) 10/14/2014 -- she could not see me yesterday in [place] and hence has come for a review today. She has a vascular workup to be done this afternoon at [facility]. She is doing fine otherwise. 10/22/2014 -- she was seen by Dr. [name] and he has recommended surgical removal of  her right saphenous vein from distal thigh to saphenofemoral junction and stab phlebectomy's of multiple large tributary branches throughout her thigh and calf. This would be done under general anesthesia in the outpatient setting. 10/29/2014 -- she is trying to work on a surgical date and in the meanwhile we have got insurance clearance for Apligraf and we will start this next week. 7/22 2016 -- she is here for the first application of Apligraf. 11/19/2014 -- she is here for a second application of Apligraf 11/26/2014 -- she has done fine after her last application of Apligraf and is awaiting her surgery which is scheduled for August 31. Objective Constitutional Pulse regular. Respirations normal and unlabored. Afebrile. Vitals Time Taken: 9:12 AM, Height: 66 in, Weight: 350.3 lbs, BMI: 56.5, Temperature: 98.2 F, Pulse: 84 bpm, Respiratory Rate: 17 breaths/min, Blood Pressure: 114/77 mmHg. Eyes Nonicteric. Reactive to light. Ears, Nose, Mouth, and Throat Lips, teeth, and gums WNL.Marland Kitchen Moist mucosa without lesions . Neck supple and nontender. No palpable supraclavicular or cervical adenopathy. Normal sized without goiter. Respiratory WNL. No retractions.. Cardiovascular Pedal Pulses WNL. No clubbing, cyanosis or edema. Lymphatic No adneopathy. No adenopathy. No adenopathy. Musculoskeletal Adexa without tenderness or enlargement.. Digits and nails w/o clubbing, cyanosis, infection, petechiae, ischemia, or inflammatory  conditions.Marland Kitchen JERRIE, SCHUSSLER ([id number]) Psychiatric Judgement and insight Intact.. No evidence of depression, anxiety, or agitation.. General Notes: the wound looks good and the surrounding skin and does not have any cellulitis or ulceration. Integumentary (Hair, Skin) No suspicious lesions. No crepitus or fluctuance. No peri-wound warmth or erythema. No masses.. Wound #1 status is Open. Original cause of wound was Not Known. The wound is located on the Right,Lateral Malleolus. The wound measures 1.5cm length x 1.5cm width x 0.1cm depth; 1.767cm^2 area and 0.177cm^3 volume. The wound is limited to skin breakdown. There is no tunneling or undermining noted. There is a medium amount of serosanguineous drainage noted. The wound margin is distinct with the outline attached to the wound base. There is large (67-100%) red granulation within the wound bed. There is no necrotic tissue within the wound bed. The periwound skin appearance exhibited: Moist, Hemosiderin Staining. The periwound skin appearance did not exhibit: Callus, Crepitus, Excoriation, Fluctuance, Friable, Induration, Localized Edema, Rash, Scarring, Dry/Scaly, Maceration, Atrophie Blanche, Cyanosis, Ecchymosis, Mottled, Pallor, Rubor, Erythema. Periwound temperature was noted as No Abnormality. The periwound has tenderness on palpation. 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 She will come back and see me next week for the third application of Apligraf. Plan Wound Cleansing: Wound #1 Right,Lateral Malleolus: Cleanse wound with mild soap and water Anesthetic: Wound #1 Right,Lateral Malleolus: ADRIAHNA, SHEARMAN  ([id number]) Topical Lidocaine 4% cream applied to wound bed prior to debridement Primary Wound Dressing: Wound #1 Right,Lateral Malleolus: Drawtex Secondary Dressing: Wound #1 Right,Lateral Malleolus: ABD pad Dressing Change Frequency: Wound #1 Right,Lateral Malleolus: Change dressing every week Follow-up Appointments: Wound #1 Right,Lateral Malleolus: Return Appointment in 1 week. Edema Control: Wound #1 Right,Lateral Malleolus: Unna Boot to Right Lower Extremity General Notes: Order Apligraf (#3) for next visit. She will have the application of an Unna's boots and will continue with elevation of the limb as suggested. She will come back and see me next week  for the third application of Apligraf. Electronic Signature(s) Signed: 11/26/2014 9:33:13 AM By: Evlyn Kanner MD, FACS Entered By: Evlyn Kanner on 11/26/2014 09:33:13 Scardino, [name] ([id number]) -------------------------------------------------------------------------------- SuperBill Details Patient Name: JANEKA, LIBMAN Date of Service: 11/26/2014 Medical Record Number: [id number] Patient Account Number: [id number] Date of Birth/Sex: 1985/06/18 (28 y.o. 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 E66.01 Morbid (severe) obesity due to excess calories Facility Procedures CPT4 Code: [id number] Description: (Facility Use Only) 30865HQ - APPLY Roland Rack BOOT RT Modifier: Quantity: 1 Physician Procedures CPT4 Code Description: [id number] - WC PHYS LEVEL 3 - EST PT ICD-10 Description Diagnosis I83.013 Varicose veins of right lower extremity with ulcer L97.312 Non-pressure chronic ulcer of right  ankle with fat E66.01 Morbid (severe) obesity due to  excess calories Modifier: of ankle layer expose Quantity: 1 d Electronic Signature(s) Signed: 11/26/2014 9:33:40 AM By: Evlyn Kanner MD, FACS Entered By: Evlyn Kanner on 11/26/2014 09:33:39

## 2014-11-29 NOTE — Progress Notes (Signed)
AAMINA, SKIFF ([id number]) Visit Report for 11/26/2014 Arrival Information Details Patient Name: [name], [name]. Date of Service: 11/26/2014 8:45 AM Medical Record Number: [id number] Patient Account Number: [id number] Date of Birth/Sex: 05/14/1985 (28 y.o. Female) Treating RN: [name], RN, [name], [place] Sink Primary Care Physician: Felix Pacini Other Clinician: Referring Physician: Felix Pacini Treating Physician/Extender: Rudene Re in Treatment: 9 Visit Information History Since Last Visit Any new allergies or adverse reactions: No Patient Arrived: Ambulatory Had a fall or experienced change in No Arrival Time: 09:11 activities of daily living that may affect Accompanied By: self risk of falls: Transfer Assistance: None Signs or symptoms of abuse/neglect since last No Patient Identification Verified: Yes visito Secondary Verification Process Yes Hospitalized since last visit: No Completed: Has Dressing in Place as Prescribed: Yes Patient Has Alerts: Yes Has Compression in Place as Prescribed: Yes Patient Alerts: 09/2013 ABI R: Pain Present Now: No 1.3 Electronic Signature(s) Signed: 11/26/2014 9:12:12 AM By: Elpidio Eric [name], RN Entered By: Elpidio Eric on 11/26/2014 09:12:12 [name] ([id number]) -------------------------------------------------------------------------------- Encounter Discharge Information Details Patient Name: [name], [name]. Date of Service: 11/26/2014 8:45 AM Medical Record Number: [id number] Patient Account Number: [id number] Date of Birth/Sex: 1985/06/26 (28 y.o. Female) Treating RN: [name], RN, [name], [place] Sink Primary Care Physician: Felix Pacini Other Clinician: Referring Physician: Felix Pacini Treating Physician/Extender: Rudene Re in Treatment: 9 Encounter Discharge Information Items Discharge Pain Level: 0 Discharge Condition: Stable Ambulatory Status: Ambulatory Discharge Destination:  Home Private Transportation: Auto Accompanied By: self Schedule Follow-up Appointment: No Medication Reconciliation completed and No provided to Patient/Care [name]: Clinical Summary of Care: Electronic Signature(s) Signed: 11/26/2014 9:38:16 AM By: Elpidio Eric [name], RN Entered By: Elpidio Eric on 11/26/2014 09:38:16 [name], [name] ([id number]) -------------------------------------------------------------------------------- Lower Extremity Assessment Details Patient Name: [name], [name] Date of Service: 11/26/2014 8:45 AM Medical Record Number: [id number] Patient Account Number: [id number] Date of Birth/Sex: [birth year] (28 y.o. Female) Treating RN: [name], RN, [name], [place] Sink Primary Care Physician: Felix Pacini Other Clinician: Referring Physician: KUNEFF, RENEE Treating Physician/Extender: Rudene Re in Treatment: 9 Edema Assessment Assessed: [Left: No] [Right: No] E[Left: dema] [Right: :] Calf Left: Right: Point of Measurement: 34 cm From Medial Instep cm 54.3 cm Ankle Left: Right: Point of Measurement: 14 cm From Medial Instep cm 29 cm Vascular Assessment Pulses: Posterior Tibial Dorsalis Pedis Palpable: [Right:Yes] Extremity colors, hair growth, and conditions: Extremity Color: [Right:Dusky] Hair Growth on Extremity: [Right:No] Temperature of Extremity: [Right:Warm] Capillary Refill: [Right:< 3 seconds] Toe Nail Assessment Left: Right: Thick: No Discolored: No Deformed: No Improper Length and Hygiene: No Electronic Signature(s) Signed: 11/26/2014 9:19:13 AM By: Elpidio Eric [name], RN Entered By: Elpidio Eric on 11/26/2014 09:19:13 [name], [name] ([id number]) -------------------------------------------------------------------------------- Multi Wound Chart Details Patient Name: [name], [name] Date of Service: 11/26/2014 8:45 AM Medical Record Number: [id number] Patient Account Number: [id number] Date of Birth/Sex: 01-09-1986 (28 y.o. Female) Treating  RN: [name] Primary Care Physician: Felix Pacini Other Clinician: Referring Physician: Felix Pacini Treating Physician/Extender: Rudene Re in Treatment: 9 Vital Signs Height(in): 66 Pulse(bpm): 84 Weight(lbs): 350.3 Blood Pressure 114/77 (mmHg): Body Mass Index(BMI): 57 Temperature(F): 98.2 Respiratory Rate 17 (breaths/min): Photos: [1:No Photos] [N/A:N/A] Wound Location: [1:Right Malleolus - Lateral] [N/A:N/A] Wounding Event: [1:Not Known] [N/A:N/A] Primary Etiology: [1:Venous Leg Ulcer] [N/A:N/A] Date Acquired: [1:09/06/2014] [N/A:N/A] Weeks of Treatment: [1:9] [N/A:N/A] Wound Status: [1:Open] [N/A:N/A] Measurements L x W x D 1.5x1.5x0.1 [N/A:N/A] (cm) Area (cm) : [1:1.767] [N/A:N/A] Volume (cm) : [1:0.177] [N/A:N/A] % Reduction in Area: [1:19.60%] [N/A:N/A] %  Reduction in Volume: 73.20% [N/A:N/A] Classification: [1:Full Thickness Without Exposed Support Structures] [N/A:N/A] Exudate Amount: [1:Medium] [N/A:N/A] Exudate Type: [1:Serosanguineous] [N/A:N/A] Exudate Color: [1:red, brown] [N/A:N/A] Wound Margin: [1:Distinct, outline attached] [N/A:N/A] Granulation Amount: [1:Large (67-100%)] [N/A:N/A] Granulation Quality: [1:Red] [N/A:N/A] Necrotic Amount: [1:None Present (0%)] [N/A:N/A] Exposed Structures: [1:Fascia: No Fat: No Tendon: No Muscle: No Joint: No Bone: No] [N/A:N/A] Limited to Skin Breakdown Epithelialization: Small (1-33%) N/A N/A Periwound Skin Texture: Edema: No N/A N/A Excoriation: No Induration: No Callus: No Crepitus: No Fluctuance: No Friable: No Rash: No Scarring: No Periwound Skin Moist: Yes N/A N/A Moisture: Maceration: No Dry/Scaly: No Periwound Skin Color: Hemosiderin Staining: Yes N/A N/A Atrophie Blanche: No Cyanosis: No Ecchymosis: No Erythema: No Mottled: No Pallor: No Rubor: No Temperature: No Abnormality N/A N/A Tenderness on Yes N/A N/A Palpation: Wound Preparation: Ulcer Cleansing: Other: N/A  N/A soap and water Topical Anesthetic Applied: Other: Lidocaine 4% Ointment Treatment Notes Electronic Signature(s) Signed: 11/26/2014 12:39:36 PM By: Elliot Gurney, RN, [name], [name], [name] Entered By: Elliot Gurney, RN, [name], Kim on 11/26/2014 45:40:98 [name] ([id number]) -------------------------------------------------------------------------------- Multi-Disciplinary Care Plan Details Patient Name: [name], [name]. Date of Service: 11/26/2014 8:45 AM Medical Record Number: [id number] Patient Account Number: [id number] Date of Birth/Sex: 1985/06/21 (28 y.o. Female) Treating RN: [name] Primary Care Physician: Felix Pacini Other Clinician: Referring Physician: Felix Pacini Treating Physician/Extender: Rudene Re in Treatment: 9 Active Inactive Abuse / Safety / Falls / Self Care Management Nursing Diagnoses: Potential for falls Goals: Patient will remain injury free Date Initiated: 09/20/2014 Goal Status: Active Interventions: Assess fall risk on admission and as needed Notes: Orientation to the Wound Care Program Nursing Diagnoses: Knowledge deficit related to the wound healing center program Goals: Patient/caregiver will verbalize understanding of the Wound Healing Center Program Date Initiated: 09/20/2014 Goal Status: Active Interventions: Provide education on orientation to the wound center Notes: Pain, Acute or Chronic Nursing Diagnoses: Pain Management - Cyclic Acute (Dressing Change Related) Goals: Patient will verbalize adequate pain control and receive pain control interventions during procedures as needed [name], [name] ([id number]) Date Initiated: 09/20/2014 Goal Status: Active Interventions: Complete pain assessment as per visit requirements Notes: Venous Leg Ulcer Nursing Diagnoses: Potential for venous Insuffiency (use before diagnosis confirmed) Goals: Non-invasive venous studies are completed as ordered Date Initiated: 09/20/2014 Goal Status:  Active Interventions: Assess peripheral edema status every visit. Notes: Wound/Skin Impairment Nursing Diagnoses: Impaired tissue integrity Goals: Ulcer/skin breakdown will have a volume reduction of 30% by week 4 Date Initiated: 09/20/2014 Goal Status: Active Interventions: Assess ulceration(s) every visit Notes: Electronic Signature(s) Signed: 11/26/2014 12:39:36 PM By: Elliot Gurney, RN, [name], [name], [name] Entered By: Elliot Gurney, RN, [name], Kim on 11/26/2014 09:29:14 [place], [name] ([id number]) -------------------------------------------------------------------------------- Pain Assessment Details Patient Name: [name], [name]. Date of Service: 11/26/2014 8:45 AM Medical Record Number: [id number] Patient Account Number: [id number] Date of Birth/Sex: 09/07/1985 (28 y.o. Female) Treating RN: [name], RN, [name], [place] Sink Primary Care Physician: Felix Pacini Other Clinician: Referring Physician: Felix Pacini Treating Physician/Extender: Rudene Re in Treatment: 9 Active Problems Location of Pain Severity and Description of Pain Patient Has Paino No Site Locations Pain Management and Medication Current Pain Management: Electronic Signature(s) Signed: 11/26/2014 9:12:23 AM By: Elpidio Eric [name], RN Entered By: Elpidio Eric on 11/26/2014 09:12:23 [name] ([id number]) -------------------------------------------------------------------------------- Patient/Caregiver Education Details Patient Name: [name], [name]. Date of Service: 11/26/2014 8:45 AM Medical Record Number: [id number] Patient Account Number: [id number] Date of Birth/Gender: September 29, 1985 (28 y.o. Female) Treating RN: [name], RN, [name], [employer]  Primary Care Physician: Felix Pacini Other Clinician: Referring Physician: Felix Pacini Treating Physician/Extender: Rudene Re in Treatment: 9 Education Assessment Education Provided To: Patient Education Topics Provided Welcome To The Wound Care Center: Methods:  Explain/Verbal Responses: State content correctly Electronic Signature(s) Signed: 11/26/2014 9:38:25 AM By: Elpidio Eric [name], RN Entered By: Elpidio Eric on 11/26/2014 09:38:25 [name], [name] ([id number]) -------------------------------------------------------------------------------- Wound Assessment Details Patient Name: [name], [name]. Date of Service: 11/26/2014 8:45 AM Medical Record Number: [id number] Patient Account Number: [id number] Date of Birth/Sex: [birth year] (28 y.o. Female) Treating RN: [name], RN, [name], Psychologist, clinical Primary Care Physician: Felix Pacini Other Clinician: Referring Physician: KUNEFF, RENEE Treating Physician/Extender: Rudene Re in Treatment: 9 Wound Status Wound Number: 1 Primary Etiology: Venous Leg Ulcer Wound Location: Right Malleolus - Lateral Wound Status: Open Wounding Event: Not Known Date Acquired: 09/06/2014 Weeks Of Treatment: 9 Clustered Wound: No Photos Photo Uploaded By: Elpidio Eric on 11/26/2014 16:18:24 Wound Measurements Length: (cm) 1.5 Width: (cm) 1.5 Depth: (cm) 0.1 Area: (cm) 1.767 Volume: (cm) 0.177 % Reduction in Area: 19.6% % Reduction in Volume: 73.2% Epithelialization: Small (1-33%) Tunneling: No Undermining: No Wound Description Full Thickness Without Exposed Classification: Support Structures Wound Margin: Distinct, outline attached Exudate Medium Amount: Exudate Type: Serosanguineous Exudate Color: red, brown Foul Odor After Cleansing: No Wound Bed Granulation Amount: Large (67-100%) Exposed Structure Granulation Quality: Red Fascia Exposed: No Necrotic Amount: None Present (0%) Fat Layer Exposed: No [name], [name] ([id number]) Tendon Exposed: No Muscle Exposed: No Joint Exposed: No Bone Exposed: No Limited to Skin Breakdown Periwound Skin Texture Texture Color No Abnormalities Noted: No No Abnormalities Noted: No Callus: No Atrophie Blanche: No Crepitus: No Cyanosis: No Excoriation:  No Ecchymosis: No Fluctuance: No Erythema: No Friable: No Hemosiderin Staining: Yes Induration: No Mottled: No Localized Edema: No Pallor: No Rash: No Rubor: No Scarring: No Temperature / Pain Moisture Temperature: No Abnormality No Abnormalities Noted: No Tenderness on Palpation: Yes Dry / Scaly: No Maceration: No Moist: Yes Wound Preparation Ulcer Cleansing: Other: soap and water, Topical Anesthetic Applied: Other: Lidocaine 4% Ointment , Treatment Notes Wound #1 (Right, Lateral Malleolus) 1. Cleansed with: Cleanse wound with antibacterial soap and water May Shower, gently pat wound dry prior to applying new dressing. May shower with protection 5. Secondary Dressing Applied ABD Pad 7. Secured with Henriette Combs to Right Lower Extremity Notes Drawtex over wound APligraf applied by MD Electronic Signature(s) Signed: 11/26/2014 9:20:15 AM By: Elpidio Eric [name], RN Entered By: Elpidio Eric on 11/26/2014 09:20:15 MARWA, FUHRMAN ([id number]) Yamaguchi, [name] ([id number]) -------------------------------------------------------------------------------- Vitals Details Patient Name: TREANNA, DUMLER Date of Service: 11/26/2014 8:45 AM Medical Record Number: [id number] Patient Account Number: [id number] Date of Birth/Sex: Jan 09, 1986 (28 y.o. Female) Treating RN: [name], RN, [name], [place] Primary Care Physician: Felix Pacini Other Clinician: Referring Physician: KUNEFF, RENEE Treating Physician/Extender: Rudene Re in Treatment: 9 Vital Signs Time Taken: 09:12 Temperature (F): 98.2 Height (in): 66 Pulse (bpm): 84 Weight (lbs): 350.3 Respiratory Rate (breaths/min): 17 Body Mass Index (BMI): 56.5 Blood Pressure (mmHg): 114/77 Reference Range: 80 - 120 mg / dl Electronic Signature(s) Signed: 11/26/2014 9:13:07 AM By: Elpidio Eric [name], RN Entered By: Elpidio Eric on 11/26/2014 09:13:07

## 2014-12-02 ENCOUNTER — Other Ambulatory Visit: Payer: Self-pay | Admitting: *Deleted

## 2014-12-02 DIAGNOSIS — Z299 Encounter for prophylactic measures, unspecified: Secondary | ICD-10-CM

## 2014-12-02 MED ORDER — AMOXICILLIN 500 MG PO CAPS: ORAL_CAPSULE | ORAL | Status: DC

## 2014-12-03 ENCOUNTER — Encounter: Payer: BLUE CROSS/BLUE SHIELD | Admitting: Surgery

## 2014-12-03 DIAGNOSIS — L97312 Non-pressure chronic ulcer of right ankle with fat layer exposed: Secondary | ICD-10-CM | POA: Diagnosis not present

## 2014-12-04 NOTE — Progress Notes (Addendum)
[name], [name] ([id number]) Visit Report for 12/03/2014 Chief Complaint Document Details Patient Name: [name], [name] 12/03/2014 10:15 Date of Service: AM Medical Record [id number] Number: Patient Account Number: [id number] 1985-12-27 (28 y.o. Treating RN: [name] Mealy, RN, [name], [place] Sink Date of Birth/Sex: Female) Other Clinician: Primary Care Physician: [name] Treating [name] Referring Physician: Felix [name] Physician/Extender: Weeks in Treatment: 10 Information Obtained from: Patient Chief Complaint Patient presents for treatment of an open ulcer due to venous insufficiency. this 29 year old patient has had an ulcer on the lateral part of her right lower extremity and this has been there for 2 weeks. Electronic Signature(s) Signed: 12/03/2014 4:25:01 PM By: [name] Kanner MD, FACS Entered By: [name] on 12/03/2014 10:44:19 [name], [name] ([id number]) -------------------------------------------------------------------------------- Cellular or Tissue Based Product Details Patient Name: [name], [name] 12/03/2014 10:15 Date of Service: AM Medical Record [id number] Number: Patient Account Number: [id number] [birth year] (28 y.o. Treating RN: [name], [place] Sink Date of Birth/Sex: Female) Other Clinician: Primary Care Physician: [name] Treating [name] Referring Physician: Felix [name] Physician/Extender: Weeks in Treatment: 10 Cellular or Tissue Based Wound #1 Right,Lateral Malleolus Product Type Applied to: Performed By: Physician [name]., MD Cellular or Tissue Based Apligraf Product Type: Time-Out Taken: Yes Location: trunk / arms / legs Wound Size (sq cm): 2.85 Product Size (sq cm): 44 Waste Size (sq cm): 41 Waste Reason: extra Amount of Product Applied (sq cm): 3 Lot #: [id number] Expiration Date: 12/04/2014 Fenestrated: Yes Instrument: Blade Reconstituted: No Secured: Yes Secured With: Steri-Strips Dressing Applied:  Yes Primary Dressing: Mepitel one Procedural Pain: 0 Post Procedural Pain: 0 Response to Treatment: Procedure was tolerated well Electronic Signature(s) Signed: 12/03/2014 4:25:01 PM By: [name] Kanner MD, FACS Entered By: [name] on 12/03/2014 10:44:11 [name], [name] ([id number]) -------------------------------------------------------------------------------- HPI Details Patient Name: [name], [name] 12/03/2014 10:15 Date of Service: AM Medical Record [id number] Number: Patient Account Number: [id number] 10-Feb-1986 (28 y.o. Treating RN: [name] Mealy, RN, [name], [place] Sink Date of Birth/Sex: Female) Other Clinician: Primary Care Physician: [name] Treating [name] Referring Physician: KUNEFF, [name] Physician/Extender: Weeks in Treatment: 10 History of Present Illness Location: the lateral part of her right ankle Quality: Patient reports experiencing a dull pain to affected area(s). Severity: Patient states wound are getting worse. Duration: Patient has had the wound for < 2 weeks prior to presenting for treatment Timing: Pain in wound is Intermittent (comes and goes Context: The wound appeared gradually over time Modifying Factors: Consults to this date include:seen in the ER and asked to follow up with wound care. Associated Signs and Symptoms: Patient reports having difficulty standing for long periods. HPI Description: 29 year old patient who started with having ulcerations on the right lower leg on the lateral part of her ankle for about 2 weeks. She was seen in the ER at [facility] and advised to see the wound care for a consultation. No X-rays of workup was done during the ER visit and no prescription for any medications of compression wraps were given. the patient is not diabetic but does have hypertension and her medications have been reviewed by me. 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 The right and left greater saphenous vein was found to be tortuous. Deep venous system was also not competent and there was reflux of more than 500 ms. She was  then seen by Dr. Tawanna Cooler [name] who recommended that the patient would not benefit from endovenous ablation and he had recommended vein stripping on the right side and multiple small phlebectomy procedures on the left side. the patient did not follow-up due to social economic reasons. She has not been wearing any compression stockings and has not taken any specific treatment for varicose veins for the last 3 years. 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 We have obtained some appointments to see the vascular surgeons by the end of the month and the patient would like to follow up with me at my [facility] on Wednesday, June 29. 10/14/2014 -- she could not see me yesterday in [place] and hence has come for a review today. She has a vascular workup to be done this afternoon at [facility]. She is doing fine otherwise. 10/22/2014 -- she was seen by [name] and he has recommended surgical removal of her right saphenous vein from distal thigh to saphenofemoral junction and stab phlebectomy's of multiple large tributary branches throughout her thigh and calf. This would be done under general anesthesia in the outpatient setting. 10/29/2014 -- she is trying to work on a surgical date and in the meanwhile we have got insurance [name], [name] ([id number]) clearance for Apligraf and we will start this next week. 7/22 2016 -- she is here for the first application of Apligraf. 11/19/2014 -- she is here for a second application of Apligraf 11/26/2014 -- she has done fine after her last application of Apligraf and is awaiting her surgery  which is scheduled for August 31. 12/03/2014 -- she is doing fine and is here for her third application of Apligraf. Electronic Signature(s) Signed: 12/03/2014 4:25:01 PM By: [name] Kanner MD, FACS Entered By: [name] on 12/03/2014 10:44:45 [name], [name] ([id number]) -------------------------------------------------------------------------------- Physical Exam Details Patient Name: [name], [name] 12/03/2014 10:15 Date of Service: AM Medical Record [id number] Number: Patient Account Number: [id number] [birth year] (28 y.o. Treating RN: [name] Mealy, RN, [name], [place] Sink Date of Birth/Sex: Female) Other Clinician: Primary Care Physician: [name] Treating Leotis Isham Referring Physician: KUNEFF, [name] Physician/Extender: Weeks in Treatment: 10 Constitutional . Pulse regular. Respirations normal and unlabored. Afebrile. . Eyes Nonicteric. Reactive to light. Ears, Nose, Mouth, and Throat Lips, teeth, and gums WNL.Marland Kitchen Moist mucosa without lesions . Neck supple and nontender. No palpable supraclavicular or cervical adenopathy. Normal sized without goiter. Respiratory WNL. No retractions.. Breath sounds WNL, No rubs, rales, rhonchi, or wheeze.. Cardiovascular Heart rhythm and rate regular, no murmur or gallop.. Pedal Pulses WNL. No clubbing, cyanosis or edema. Lymphatic No adneopathy. No adenopathy. No adenopathy. Musculoskeletal Adexa without tenderness or enlargement.. Digits and nails w/o clubbing, cyanosis, infection, petechiae, ischemia, or inflammatory conditions.. Integumentary (Hair, Skin) No suspicious lesions. No crepitus or fluctuance. No peri-wound warmth or erythema. No masses.Marland Kitchen Psychiatric Judgement and insight Intact.. No evidence of depression, anxiety, or agitation.. Notes The wound looks clean and the surrounding skin is fine. We will be ready for the next application of Apligraf. Electronic Signature(s) Signed: 12/03/2014 4:25:01 PM By: [name] Kanner MD,  FACS Entered By: [name] on 12/03/2014 10:45:18 Grays, [name] ([id number]) -------------------------------------------------------------------------------- Physician Orders Details Patient Name: LARAY, RIVKIN 12/03/2014 10:15 Date of Service: AM Medical Record [id number] Number: Patient Account Number: [id number] 1985/05/29 (28 y.o. 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 Edema Control Wound #1 Right,Lateral Malleolus o Unna Boot to Right Lower Extremity Advanced Therapies Wound #1 Right,Lateral Malleolus MAGAN, WINNETT. ([id number]) o Apligraf application in clinic; including contact layer, fixation with steri strips, dry gauze and cover dressing. Electronic Signature(s) Signed: 12/03/2014 2:13:59 PM By: Elpidio Eric [name], RN Signed: 12/03/2014 4:25:01 PM By: [name] Kanner MD, FACS Entered By: Elpidio Eric on 12/03/2014 10:49:35 Kilman, [name]  ([id number]) -------------------------------------------------------------------------------- Problem List Details Patient Name: [name], [name] 12/03/2014 10:15 Date of Service: AM Medical Record [id number] Number: Patient Account Number: [id number] 11-23-1985 (28 y.o. 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 09/20/2014 Yes L97.312 Non-pressure chronic ulcer of right ankle with fat layer 09/20/2014 Yes exposed E66.01 Morbid (severe) obesity due to excess calories 09/20/2014 Yes Inactive Problems Resolved Problems Electronic Signature(s) Signed: 12/03/2014 4:25:01 PM By: [name] Kanner MD, FACS Entered By: [name] on 12/03/2014 10:44:01 Henkels, [name] ([id number]) -------------------------------------------------------------------------------- Progress Note Details Patient Name: [name], [name] 12/03/2014 10:15 Date of Service: AM Medical Record [id number] Number: Patient Account Number: [id number] 05-Mar-1986 (28 y.o. Treating RN: [name] Mealy, RN, [name], [place] Sink Date of Birth/Sex: Female) Other Clinician: Primary Care Physician: [name] Treating [name] [name] Referring Physician: Felix [name] Physician/Extender: Weeks in Treatment: 10 Subjective Chief Complaint Information obtained from Patient Patient presents for treatment of an open ulcer due to venous insufficiency. this 29 year old patient has had an ulcer on the lateral part of her right lower extremity and this has been there for 2 weeks. History of Present Illness (HPI) The following HPI elements were documented for the patient's wound: Location: the lateral part of her right ankle Quality: Patient  reports experiencing a dull pain to affected area(s). Severity: Patient states wound are getting worse. Duration: Patient has had the wound for < 2 weeks prior to presenting for treatment Timing: Pain in wound is Intermittent (comes and goes Context: The wound appeared gradually over time Modifying Factors: Consults to this date include:seen in the ER and asked to follow up with wound care. Associated Signs and Symptoms: Patient reports having difficulty standing for long periods. 29 year old patient who started with having ulcerations on the right lower leg on the lateral part of her ankle for about 2 weeks. She was seen in the ER at [facility] and advised to see the wound care for a consultation. No X-rays of workup was done during the ER visit and no prescription for any medications of compression wraps were given. the patient is not diabetic but does have hypertension and  her medications have been reviewed by me. 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 The right and left greater saphenous vein was found to be tortuous. Deep venous system was also not competent and there was reflux of more than 500 ms. She was then seen by Dr. Tawanna Cooler [name] who recommended that the patient would not benefit from endovenous ablation and he had recommended vein stripping on the right side and multiple small phlebectomy procedures on the left side. the patient did not follow-up due to social economic reasons. She has not been wearing any compression stockings and has not taken any specific treatment for varicose veins for the last 3 years. 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 We have obtained some appointments to see the vascular  surgeons by the end of the month and the patient would like to follow up with me at my [facility] on Wednesday, June 29. [name], [name] ([id number]) 10/14/2014 -- she could not see me yesterday in [place] and hence has come for a review today. She has a vascular workup to be done this afternoon at [facility]. She is doing fine otherwise. 10/22/2014 -- she was seen by [name] and he has recommended surgical removal of her right saphenous vein from distal thigh to saphenofemoral junction and stab phlebectomy's of multiple large tributary branches throughout her thigh and calf. This would be done under general anesthesia in the outpatient setting. 10/29/2014 -- she is trying to work on a surgical date and in the meanwhile we have got insurance clearance for Apligraf and we will start this next week. 7/22 2016 -- she is here for the first application of Apligraf. 11/19/2014 -- she is here for a second application of Apligraf 11/26/2014 -- she has done fine after her last application of Apligraf and is awaiting her surgery which is scheduled for August 31. 12/03/2014 -- she is doing fine and is here for her third application of Apligraf. Objective Constitutional Pulse regular. Respirations normal and unlabored. Afebrile. Vitals Time Taken: 10:18 AM, Height: 66 in, Weight: 350.3 lbs, BMI: 56.5, Temperature: 98.1 F, Pulse: 86 bpm, Respiratory Rate: 17 breaths/min, Blood Pressure: 117/77 mmHg. Eyes Nonicteric. Reactive to light. Ears, Nose, Mouth, and Throat Lips, teeth, and gums WNL.Marland Kitchen Moist mucosa without lesions . Neck supple and nontender. No palpable supraclavicular or cervical adenopathy. Normal sized without goiter. Respiratory WNL. No retractions.. Breath sounds WNL, No rubs, rales, rhonchi, or wheeze.. Cardiovascular Heart rhythm and rate regular, no murmur or gallop.. Pedal Pulses WNL. No clubbing, cyanosis or edema. Lymphatic No adneopathy. No adenopathy. No  adenopathy. Musculoskeletal Adexa without tenderness or enlargement.. Digits and nails w/o clubbing, cyanosis, infection, petechiae, Lockridge, Abrey J. ([id number]) ischemia, or inflammatory conditions.Marland Kitchen Psychiatric Judgement and insight Intact.. No evidence of depression, anxiety, or agitation.. General Notes: The wound looks clean and the surrounding skin is fine. We will be ready for the next application of Apligraf. Integumentary (Hair, Skin) No suspicious lesions. No crepitus or fluctuance. No peri-wound warmth or erythema. No masses.. Wound #1 status is Open. Original cause of wound was Not Known. The wound is located on the Right,Lateral Malleolus. The wound measures 1.5cm length x 1.9cm width x 0.1cm depth; 2.238cm^2 area and 0.224cm^3  volume. 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 She will come back and see me next week for a wound check. Procedures Wound #1 Wound #1 is a Venous Leg Ulcer located on the Right,Lateral Malleolus. A skin graft procedure using a bioengineered skin substitute/cellular or tissue based product was performed by Willella Harding, Ignacia [name]., MD. Apligraf was applied and secured with Steri-Strips. 3 sq cm of product was utilized and 41 sq cm was wasted due to extra. Post Application, Mepitel one was applied. A Time Out was conducted prior to the start of the procedure. The procedure was tolerated well with a pain level of 0 throughout and a pain level of 0 following the procedure. [name], [name] ([id number]) Plan Wound Cleansing: Wound #1 Right,Lateral Malleolus: Cleanse wound with mild soap and water Anesthetic: Wound #1  Right,Lateral Malleolus: Topical Lidocaine 4% cream applied to wound bed prior to debridement Secondary Dressing: Drawtex Wound #1 Right,Lateral Malleolus: ABD pad Dressing Change Frequency: Wound #1 Right,Lateral Malleolus: Change dressing every week Follow-up Appointments: Wound #1 Right,Lateral Malleolus: Return Appointment in 1 week. Edema Control: Wound #1 Right,Lateral Malleolus: Unna Boot to Right Lower Extremity Advanced Therapies: Wound #1 Right,Lateral Malleolus: Apligraf application in clinic; including contact layer, fixation with steri strips, dry gauze and cover dressing. With the usual precautions and procedure being followed the third application of Apligraf has been applied and the conus boat will be placed. She will come back and see me next week for a wound check. Electronic Signature(s) Signed: 12/06/2014 8:28:35 AM By: [name] Kanner MD, FACS Previous Signature: 12/03/2014 4:25:01 PM Version By: [name] Kanner MD, FACS Entered By: [name] on 12/06/2014 08:25:11 [name], [name] ([id number]) -------------------------------------------------------------------------------- SuperBill Details Patient Name: SHERILYNN, DIEU. Date of Service: 12/03/2014 Medical Record Patient Account Number: [id number] Number: [name], Treating RN: 1985-10-23 (28 y.o. 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 E66.01 Morbid (severe) obesity due to excess calories Facility Procedures CPT4 Code Description: [id number] (Facility Use Only) Apligraf 1 SQ CM Modifier: Quantity: 44 CPT4 Code Description: 30865784 15271 - SKIN SUB GRAFT  TRNK/ARM/LEG ICD-10 Description Diagnosis I83.013 Varicose veins of right lower extremity with ulcer L97.312 Non-pressure chronic ulcer of right ankle with fat E66.01 Morbid (severe) obesity due to  excess calories Modifier: of ankle layer exposed Quantity: 1 Physician Procedures CPT4 Code Description: [id number] 15271 - WC PHYS SKIN SUB GRAFT TRNK/ARM/LEG ICD-10 Description Diagnosis I83.013 Varicose veins of right lower extremity with ulcer o L97.312 Non-pressure chronic ulcer of right ankle with fat l E66.01 Morbid (severe)  obesity due to excess calories Modifier: f ankle ayer exposed Quantity: 1 Electronic Signature(s) Signed: 12/03/2014 4:25:01 PM By: [name] Kanner MD, FACS Entered By: [name] on 12/03/2014 10:46:21

## 2014-12-04 NOTE — Progress Notes (Signed)
KEALEY, KEMMER ([id number]) Visit Report for 12/03/2014 Arrival Information Details Patient Name: [name], [name]. Date of Service: 12/03/2014 10:15 AM Medical Record Number: [id number] Patient Account Number: [id number] Date of Birth/Sex: 08-02-1985 (28 y.o. Female) Treating RN: [name], RN, [name], [place] Sink Primary Care Physician: Felix Pacini Other Clinician: Referring Physician: Felix Pacini Treating Physician/Extender: Rudene Re in Treatment: 10 Visit Information History Since Last Visit Added or deleted any medications: No Patient Arrived: Ambulatory Any new allergies or adverse reactions: No Arrival Time: 10:16 Had a fall or experienced change in No Accompanied By: self activities of daily living that may affect Transfer Assistance: None risk of falls: Patient Identification Verified: Yes Signs or symptoms of abuse/neglect since last No Secondary Verification Process Yes visito Completed: Hospitalized since last visit: No Patient Has Alerts: Yes Has Dressing in Place as Prescribed: Yes Patient Alerts: 09/2013 ABI R: Has Compression in Place as Prescribed: Yes 1.3 Pain Present Now: No Electronic Signature(s) Signed: 12/03/2014 2:13:59 PM By: Elpidio Eric [name], RN Entered By: Elpidio Eric on 12/03/2014 10:18:07 [name] ([id number]) -------------------------------------------------------------------------------- Encounter Discharge Information Details Patient Name: [name], [name]. Date of Service: 12/03/2014 10:15 AM Medical Record Number: [id number] Patient Account Number: [id number] Date of Birth/Sex: Apr 25, 1985 (28 y.o. Female) Treating RN: [name], RN, [name], [place] Sink Primary Care Physician: Felix Pacini Other Clinician: Referring Physician: Felix Pacini Treating Physician/Extender: Rudene Re in Treatment: 10 Encounter Discharge Information Items Discharge Pain Level: 0 Discharge Condition: Stable Ambulatory Status: Ambulatory Discharge  Destination: Home Transportation: Private Auto Accompanied By: self Schedule Follow-up Appointment: No Medication Reconciliation completed No and provided to Patient/Care Carlton Buskey: Patient Clinical Summary of Care: Declined Electronic Signature(s) Signed: 12/03/2014 3:57:40 PM By: Gwenlyn Perking Previous Signature: 12/03/2014 2:13:59 PM Version By: Elpidio Eric [name], RN Entered By: Gwenlyn Perking on 12/03/2014 15:57:40 Drumheller, [name] ([id number]) -------------------------------------------------------------------------------- Lower Extremity Assessment Details Patient Name: [name], [name] Date of Service: 12/03/2014 10:15 AM Medical Record Number: [id number] Patient Account Number: [id number] Date of Birth/Sex: June 25, 1985 (28 y.o. Female) Treating RN: [name], RN, [name], [place] Sink Primary Care Physician: Felix Pacini Other Clinician: Referring Physician: KUNEFF, RENEE Treating Physician/Extender: Rudene Re in Treatment: 10 Edema Assessment Assessed: [Left: No] [Right: No] E[Left: dema] [Right: :] Calf Left: Right: Point of Measurement: 34 cm From Medial Instep cm 54 cm Ankle Left: Right: Point of Measurement: 14 cm From Medial Instep cm 29 cm Vascular Assessment Claudication: Claudication Assessment [Right:None] Pulses: Posterior Tibial Dorsalis Pedis Palpable: [Right:Yes] Extremity colors, hair growth, and conditions: Extremity Color: [Right:Dusky] Hair Growth on Extremity: [Right:Yes] Temperature of Extremity: [Right:Warm] Capillary Refill: [Right:< 3 seconds] Toe Nail Assessment Left: Right: Thick: No Discolored: No Deformed: No Improper Length and Hygiene: No Electronic Signature(s) Signed: 12/03/2014 2:13:59 PM By: Elpidio Eric [name], RN Entered By: Elpidio Eric on 12/03/2014 10:27:36 Gullo, [name] ([id number]) Fails, [name] ([id number]) -------------------------------------------------------------------------------- Multi Wound Chart Details Patient  Name: [name], [name]. Date of Service: 12/03/2014 10:15 AM Medical Record Number: [id number] Patient Account Number: [id number] Date of Birth/Sex: 1985/12/12 (28 y.o. Female) Treating RN: [name], RN, [name], [place] Sink Primary Care Physician: Felix Pacini Other Clinician: Referring Physician: KUNEFF, RENEE Treating Physician/Extender: Rudene Re in Treatment: 10 Vital Signs Height(in): 66 Pulse(bpm): 86 Weight(lbs): 350.3 Blood Pressure 117/77 (mmHg): Body Mass Index(BMI): 57 Temperature(F): 98.1 Respiratory Rate 17 (breaths/min): Photos: [1:No Photos] [N/A:N/A] Wound Location: [1:Right, Lateral Malleolus] [N/A:N/A] Wounding Event: [1:Not Known] [N/A:N/A] Primary Etiology: [1:Venous Leg Ulcer] [N/A:N/A] Date Acquired: [1:09/06/2014] [N/A:N/A] Weeks of Treatment: [1:10] [N/A:N/A] Wound Status: [1:Open] [N/A:N/A]  Measurements L x W x D 1.5x1.9x0.1 [N/A:N/A] (cm) Area (cm) : [1:2.238] [N/A:N/A] Volume (cm) : [1:0.224] [N/A:N/A] % Reduction in Area: [1:-1.80%] [N/A:N/A] % Reduction in Volume: 66.10% [N/A:N/A] Classification: [1:Full Thickness Without Exposed Support Structures] [N/A:N/A] Periwound Skin Texture: No Abnormalities Noted [N/A:N/A] Periwound Skin [1:No Abnormalities Noted] [N/A:N/A] Moisture: Periwound Skin Color: No Abnormalities Noted [N/A:N/A] Tenderness on [1:No] [N/A:N/A] Palpation: Procedures Performed: Cellular or Tissue Based [1:Product] [N/A:N/A] Treatment Notes Electronic Signature(s) Signed: 12/03/2014 2:13:59 PM By: Elpidio Eric [name], RN Laughery, [name] ([id number]) Entered By: Elpidio Eric on 12/03/2014 10:49:13 Nikkel, [name] -------------------------------------------------------------------------------- Multi-Disciplinary Care Plan Details Patient Name: [name], [name]. Date of Service: 12/03/2014 10:15 AM Medical Record Number: [id number] Patient Account Number: [id number] Date of Birth/Sex: Dec 22, 1985 (28 y.o.  Female) Treating RN: [name], RN, [name], [place] Sink Primary Care Physician: Felix Pacini Other Clinician: Referring Physician: Felix Pacini Treating Physician/Extender: Rudene Re in Treatment: 10 Active Inactive Abuse / Safety / Falls / Self Care Management Nursing Diagnoses: Potential for falls Goals: Patient will remain injury free Date Initiated: 09/20/2014 Goal Status: Active Interventions: Assess fall risk on admission and as needed Notes: Orientation to the Wound Care Program Nursing Diagnoses: Knowledge deficit related to the wound healing center program Goals: Patient/caregiver will verbalize understanding of the Wound Healing Center Program Date Initiated: 09/20/2014 Goal Status: Active Interventions: Provide education on orientation to the wound center Notes: Pain, Acute or Chronic Nursing Diagnoses: Pain Management - Cyclic Acute (Dressing Change Related) Goals: Patient will verbalize adequate pain control and receive pain control interventions during procedures as needed SHYLO, ZAMOR ([id number]) Date Initiated: 09/20/2014 Goal Status: Active Interventions: Complete pain assessment as per visit requirements Notes: Venous Leg Ulcer Nursing Diagnoses: Potential for venous Insuffiency (use before diagnosis confirmed) Goals: Non-invasive venous studies are completed as ordered Date Initiated: 09/20/2014 Goal Status: Active Interventions: Assess peripheral edema status every visit. Notes: Wound/Skin Impairment Nursing Diagnoses: Impaired tissue integrity Goals: Ulcer/skin breakdown will have a volume reduction of 30% by week 4 Date Initiated: 09/20/2014 Goal Status: Active Interventions: Assess ulceration(s) every visit Notes: Electronic Signature(s) Signed: 12/03/2014 2:13:59 PM By: Elpidio Eric [name], RN Entered By: Elpidio Eric on 12/03/2014 10:49:05 Kokesh, [name]  ([id number]) -------------------------------------------------------------------------------- Pain Assessment Details Patient Name: [name], [name]. Date of Service: 12/03/2014 10:15 AM Medical Record Number: [id number] Patient Account Number: [id number] Date of Birth/Sex: Aug 19, 1985 (28 y.o. Female) Treating RN: [name], RN, [name], [place] Sink Primary Care Physician: Felix Pacini Other Clinician: Referring Physician: Felix Pacini Treating Physician/Extender: Rudene Re in Treatment: 10 Active Problems Location of Pain Severity and Description of Pain Patient Has Paino No Site Locations Pain Management and Medication Current Pain Management: Electronic Signature(s) Signed: 12/03/2014 2:13:59 PM By: Elpidio Eric [name], RN Entered By: Elpidio Eric on 12/03/2014 10:18:13 Head, [name] ([id number]) -------------------------------------------------------------------------------- Patient/Caregiver Education Details Patient Name: [name], [name]. Date of Service: 12/03/2014 10:15 AM Medical Record Number: [id number] Patient Account Number: [id number] Date of Birth/Gender: 1985-04-26 (28 y.o. Female) Treating RN: Clover Mealy, RN, [name], [place] Sink Primary Care Physician: Felix Pacini Other Clinician: Referring Physician: Felix Pacini Treating Physician/Extender: Rudene Re in Treatment: 10 Education Assessment Education Provided To: Patient Education Topics Provided Basic Hygiene: Methods: Explain/Verbal Responses: State content correctly Welcome To The Wound Care Center: Methods: Explain/Verbal Responses: State content correctly Electronic Signature(s) Signed: 12/03/2014 2:13:59 PM By: Elpidio Eric [name], RN Entered By: Elpidio Eric on 12/03/2014 10:50:29 Meininger, [name] ([id number]) -------------------------------------------------------------------------------- Wound Assessment Details Patient Name: [name], [name]. Date of Service: 12/03/2014 10:15 AM  Medical Record Number:  [id number] Patient Account Number: [id number] Date of Birth/Sex: 1985-12-13 (28 y.o. Female) Treating RN: [name], RN, [name], Psychologist, clinical Primary Care Physician: Felix Pacini Other Clinician: Referring Physician: KUNEFF, RENEE Treating Physician/Extender: Rudene Re in Treatment: 10 Wound Status Wound Number: 1 Primary Etiology: Venous Leg Ulcer Wound Location: Right, Lateral Malleolus Wound Status: Open Wounding Event: Not Known Date Acquired: 09/06/2014 Weeks Of Treatment: 10 Clustered Wound: No Photos Photo Uploaded By: Elpidio Eric on 12/03/2014 14:13:51 Wound Measurements Length: (cm) 1.5 Width: (cm) 1.9 Depth: (cm) 0.1 Area: (cm) 2.238 Volume: (cm) 0.224 % Reduction in Area: -1.8% % Reduction in Volume: 66.1% Wound Description Full Thickness Without Exposed Classification: Support Structures Periwound Skin Texture Texture Color No Abnormalities Noted: No No Abnormalities Noted: No Moisture No Abnormalities Noted: No Treatment Notes Wound #1 (Right, Lateral Malleolus) Relph, [name] ([id number]) 1. Cleansed with: Cleanse wound with antibacterial soap and water May Shower, gently pat wound dry prior to applying new dressing. May shower with protection 5. Secondary Dressing Applied ABD Pad 7. Secured with Henriette Combs to Right Lower Extremity Notes Drawtex over wound APligraf applied by MD Electronic Signature(s) Signed: 12/03/2014 2:13:59 PM By: Elpidio Eric [name], RN Entered By: Elpidio Eric on 12/03/2014 10:26:03 [name] ([id number]) -------------------------------------------------------------------------------- Vitals Details Patient Name: [name], [name] Date of Service: 12/03/2014 10:15 AM Medical Record Number: [id number] Patient Account Number: [id number] Date of Birth/Sex: 1986/01/16 (28 y.o. Female) Treating RN: [name], RN, [name], [place] Primary Care Physician: Felix Pacini Other Clinician: Referring Physician: KUNEFF, RENEE Treating  Physician/Extender: Rudene Re in Treatment: 10 Vital Signs Time Taken: 10:18 Temperature (F): 98.1 Height (in): 66 Pulse (bpm): 86 Weight (lbs): 350.3 Respiratory Rate (breaths/min): 17 Body Mass Index (BMI): 56.5 Blood Pressure (mmHg): 117/77 Reference Range: 80 - 120 mg / dl Electronic Signature(s) Signed: 12/03/2014 2:13:59 PM By: Elpidio Eric [name], RN Entered By: Elpidio Eric on 12/03/2014 10:18:32

## 2014-12-10 ENCOUNTER — Encounter: Payer: BLUE CROSS/BLUE SHIELD | Admitting: Surgery

## 2014-12-10 DIAGNOSIS — L97312 Non-pressure chronic ulcer of right ankle with fat layer exposed: Secondary | ICD-10-CM | POA: Diagnosis not present

## 2014-12-10 NOTE — Progress Notes (Signed)
[name], [name] ([id number]) Visit Report for 12/10/2014 Arrival Information Details Patient Name: [name], [name]. Date of Service: 12/10/2014 1:45 PM Medical Record Number: [id number] Patient Account Number: [id number] Date of Birth/Sex: Mar 22, 1986 (28 y.o. Female) Treating RN: [name] Primary Care Physician: Felix Pacini Other Clinician: Referring Physician: Felix Pacini Treating Physician/Extender: Rudene Re in Treatment: 11 Visit Information History Since Last Visit Added or deleted any medications: No Patient Arrived: Ambulatory Any new allergies or adverse reactions: No Arrival Time: 13:47 Had a fall or experienced change in No Accompanied By: self activities of daily living that may affect Transfer Assistance: None risk of falls: Patient Identification Verified: Yes Signs or symptoms of abuse/neglect since last No Secondary Verification Process Yes visito Completed: Hospitalized since last visit: No Patient Has Alerts: Yes Has Dressing in Place as Prescribed: Yes Patient Alerts: 09/2013 ABI R: Has Compression in Place as Prescribed: Yes 1.3 Pain Present Now: No Electronic Signature(s) Signed: 12/10/2014 3:54:27 PM By: Elliot Gurney, RN, [name], [name], [name] Entered By: Elliot Gurney, RN, [name], Kim on 12/10/2014 14:11:54 Keefe, [name] ([id number]) -------------------------------------------------------------------------------- Encounter Discharge Information Details Patient Name: TIMERA, [name]. Date of Service: 12/10/2014 1:45 PM Medical Record Number: [id number] Patient Account Number: [id number] Date of Birth/Sex: 14-Aug-1985 (28 y.o. Female) Treating RN: [name] Primary Care Physician: Felix Pacini Other Clinician: Referring Physician: Felix Pacini Treating Physician/Extender: Rudene Re in Treatment: 11 Encounter Discharge Information Items Discharge Pain Level: 0 Discharge Condition: Stable Ambulatory Status: Ambulatory Discharge Destination:  Home Private Transportation: Auto Accompanied By: self Schedule Follow-up Appointment: Yes Medication Reconciliation completed and Yes provided to Patient/Care Ahsan Esterline: Clinical Summary of Care: Electronic Signature(s) Signed: 12/10/2014 3:54:27 PM By: Elliot Gurney, RN, [name], [name], [name] Entered By: Elliot Gurney, RN, [name], Kim on 12/10/2014 13:59:32 Strang, [name] ([id number]) -------------------------------------------------------------------------------- Lower Extremity Assessment Details Patient Name: [name], [name]. Date of Service: 12/10/2014 1:45 PM Medical Record Number: [id number] Patient Account Number: [id number] Date of Birth/Sex: 1985-05-12 (28 y.o. Female) Treating RN: [name] Primary Care Physician: Felix Pacini Other Clinician: Referring Physician: Felix Pacini Treating Physician/Extender: Rudene Re in Treatment: 11 Edema Assessment Assessed: [Left: No] [Right: No] E[Left: dema] [Right: :] Calf Left: Right: Point of Measurement: cm From Medial Instep cm 55 cm Ankle Left: Right: Point of Measurement: cm From Medial Instep cm 31.5 cm Vascular Assessment Pulses: Posterior Tibial Dorsalis Pedis Palpable: [Right:Yes] Extremity colors, hair growth, and conditions: Extremity Color: [Right:Hyperpigmented] Hair Growth on Extremity: [Right:No] Temperature of Extremity: [Right:Warm] Capillary Refill: [Right:< 3 seconds] Toe Nail Assessment Left: Right: Thick: No Discolored: No Deformed: No Improper Length and Hygiene: No Electronic Signature(s) Signed: 12/10/2014 3:54:27 PM By: Elliot Gurney, RN, [name], [name], [name] Entered By: Elliot Gurney, RN, [name], Kim on 12/10/2014 13:53:02 [place], [name] ([id number]) -------------------------------------------------------------------------------- Multi Wound Chart Details Patient Name: [name], [name]. Date of Service: 12/10/2014 1:45 PM Medical Record Number: [id number] Patient Account Number: [id number] Date of Birth/Sex: 14-Oct-1985  (28 y.o. Female) Treating RN: [name] Primary Care Physician: Felix Pacini Other Clinician: Referring Physician: Felix Pacini Treating Physician/Extender: Rudene Re in Treatment: 11 Vital Signs Height(in): 66 Pulse(bpm): 85 Weight(lbs): 350.3 Blood Pressure 137/76 (mmHg): Body Mass Index(BMI): 57 Temperature(F): 98.6 Respiratory Rate 18 (breaths/min): Photos: [1:No Photos] [N/A:N/A] Wound Location: [1:Right Malleolus - Lateral] [N/A:N/A] Wounding Event: [1:Not Known] [N/A:N/A] Primary Etiology: [1:Venous Leg Ulcer] [N/A:N/A] Date Acquired: [1:09/06/2014] [N/A:N/A] Weeks of Treatment: [1:11] [N/A:N/A] Wound Status: [1:Open] [N/A:N/A] Measurements L x W x D 1.5x1.9x0.1 [N/A:N/A] (cm) Area (cm) : [1:2.238] [N/A:N/A] Volume (cm) : [  1:0.224] [N/A:N/A] % Reduction in Area: [1:-1.80%] [N/A:N/A] % Reduction in Volume: 66.10% [N/A:N/A] Classification: [1:Full Thickness Without Exposed Support Structures] [N/A:N/A] Periwound Skin Texture: No Abnormalities Noted [N/A:N/A] Periwound Skin [1:No Abnormalities Noted] [N/A:N/A] Moisture: Periwound Skin Color: No Abnormalities Noted [N/A:N/A] Tenderness on [1:No] [N/A:N/A] Palpation: Assessment Notes: [1:One week post Apligraf. Measurements used from last visit.] [N/A:N/A] Treatment Notes Electronic Signature(s) SHANQUITA, RONNING ([id number]) Signed: 12/10/2014 3:54:27 PM By: Elliot Gurney, RN, [name], [name], [name] Entered By: Elliot Gurney, RN, [name], Kim on 12/10/2014 13:56:54 Lavine, [name] -------------------------------------------------------------------------------- Multi-Disciplinary Care Plan Details Patient Name: [name], [name]. Date of Service: 12/10/2014 1:45 PM Medical Record Number: [id number] Patient Account Number: [id number] Date of Birth/Sex: 04-17-1985 (28 y.o. Female) Treating RN: [name] Primary Care Physician: Felix Pacini Other Clinician: Referring Physician: Felix Pacini Treating  Physician/Extender: Rudene Re in Treatment: 11 Active Inactive Abuse / Safety / Falls / Self Care Management Nursing Diagnoses: Potential for falls Goals: Patient will remain injury free Date Initiated: 09/20/2014 Goal Status: Active Interventions: Assess fall risk on admission and as needed Notes: Orientation to the Wound Care Program Nursing Diagnoses: Knowledge deficit related to the wound healing center program Goals: Patient/caregiver will verbalize understanding of the Wound Healing Center Program Date Initiated: 09/20/2014 Goal Status: Active Interventions: Provide education on orientation to the wound center Notes: Pain, Acute or Chronic Nursing Diagnoses: Pain Management - Cyclic Acute (Dressing Change Related) Goals: Patient will verbalize adequate pain control and receive pain control interventions during procedures as needed NADRA, HRITZ ([id number]) Date Initiated: 09/20/2014 Goal Status: Active Interventions: Complete pain assessment as per visit requirements Notes: Venous Leg Ulcer Nursing Diagnoses: Potential for venous Insuffiency (use before diagnosis confirmed) Goals: Non-invasive venous studies are completed as ordered Date Initiated: 09/20/2014 Goal Status: Active Interventions: Assess peripheral edema status every visit. Notes: Wound/Skin Impairment Nursing Diagnoses: Impaired tissue integrity Goals: Ulcer/skin breakdown will have a volume reduction of 30% by week 4 Date Initiated: 09/20/2014 Goal Status: Active Interventions: Assess ulceration(s) every visit Notes: Electronic Signature(s) Signed: 12/10/2014 3:54:27 PM By: Elliot Gurney, RN, [name], [name], [name] Entered By: Elliot Gurney, RN, [name], Kim on 12/10/2014 13:56:47 Alemu, [name] ([id number]) -------------------------------------------------------------------------------- Pain Assessment Details Patient Name: MORINE, KOHLMAN. Date of Service: 12/10/2014 1:45 PM Medical Record Number:  [id number] Patient Account Number: [id number] Date of Birth/Sex: 1985-11-25 (28 y.o. Female) Treating RN: [name] Primary Care Physician: Felix Pacini Other Clinician: Referring Physician: Felix Pacini Treating Physician/Extender: Rudene Re in Treatment: 11 Active Problems Location of Pain Severity and Description of Pain Patient Has Paino No Site Locations Pain Management and Medication Current Pain Management: Electronic Signature(s) Signed: 12/10/2014 3:54:27 PM By: Elliot Gurney, RN, [name], [name], [name] Entered By: Elliot Gurney, RN, [name], Kim on 12/10/2014 13:47:42 Kuan, [name] ([id number]) -------------------------------------------------------------------------------- Patient/Caregiver Education Details Patient Name: LAYLAMARIE, MEUSER Date of Service: 12/10/2014 1:45 PM Medical Record Number: [id number] Patient Account Number: [id number] Date of Birth/Gender: [birth year] (28 y.o. Female) Treating RN: [name] Primary Care Physician: Felix Pacini Other Clinician: Referring Physician: Felix Pacini Treating Physician/Extender: Rudene Re in Treatment: 11 Education Assessment Education Provided To: Patient Education Topics Provided Wound/Skin Impairment: Handouts: Caring for Your Ulcer, Other: wound care as prescribed Electronic Signature(s) Signed: 12/10/2014 3:54:27 PM By: Elliot Gurney, RN, [name], [name], [name] Entered By: Elliot Gurney, RN, [name], Kim on 12/10/2014 13:59:48 Wroe, [name] ([id number]) -------------------------------------------------------------------------------- Wound Assessment Details Patient Name: MEOSHIA, BILLING. Date of Service: 12/10/2014 1:45 PM Medical Record Number: [id number] Patient Account Number: [id number] Date of Birth/Sex:  [birth year] (28 y.o. Female) Treating RN: [name] Primary Care Physician: Felix Pacini Other Clinician: Referring Physician: Felix Pacini Treating Physician/Extender: Rudene Re in Treatment: 11 Wound  Status Wound Number: 1 Primary Etiology: Venous Leg Ulcer Wound Location: Right Malleolus - Lateral Wound Status: Open Wounding Event: Not Known Date Acquired: 09/06/2014 Weeks Of Treatment: 11 Clustered Wound: No Wound Measurements Length: (cm) 1.5 Width: (cm) 1.9 Depth: (cm) 0.1 Area: (cm) 2.238 Volume: (cm) 0.224 % Reduction in Area: -1.8% % Reduction in Volume: 66.1% Wound Description Full Thickness Without Exposed Classification: Support Structures Periwound Skin Texture Texture Color No Abnormalities Noted: No No Abnormalities Noted: No Moisture No Abnormalities Noted: No Assessment Notes One week post Apligraf. Measurements used from last visit. Treatment Notes Wound #1 (Right, Lateral Malleolus) 1. Cleansed with: Clean wound with Normal Saline 2. Anesthetic Topical Lidocaine 4% cream to wound bed prior to debridement 7. Secured with Henriette Combs to Right Lower Extremity Notes Drawtex over wound NAJLA, AUGHENBAUGH ([id number]) Electronic Signature(s) Signed: 12/10/2014 3:54:27 PM By: Elliot Gurney, RN, [name], [name], [name] Entered By: Elliot Gurney, RN, [name], Kim on 12/10/2014 13:54:33 Connors, [name] -------------------------------------------------------------------------------- Vitals Details Patient Name: NASTASHIA, GALLO Date of Service: 12/10/2014 1:45 PM Medical Record Number: [id number] Patient Account Number: [id number] Date of Birth/Sex: 03-21-1986 (28 y.o. Female) Treating RN: [name] Primary Care Physician: Felix Pacini Other Clinician: Referring Physician: Felix Pacini Treating Physician/Extender: Rudene Re in Treatment: 11 Vital Signs Time Taken: 13:47 Temperature (F): 98.6 Height (in): 66 Pulse (bpm): 85 Weight (lbs): 350.3 Respiratory Rate (breaths/min): 18 Body Mass Index (BMI): 56.5 Blood Pressure (mmHg): 137/76 Reference Range: 80 - 120 mg / dl Electronic Signature(s) Signed: 12/10/2014 3:54:27 PM By: Elliot Gurney, RN, [name], Kim  RN, [name] Entered By: Elliot Gurney, RN, [name], Kim on 12/10/2014 13:48:23

## 2014-12-10 NOTE — Progress Notes (Signed)
RAMA, SORCI ([id number]) Visit Report for 12/10/2014 Chief Complaint Document Details Patient Name: [name], [name]. Date of Service: 12/10/2014 1:45 PM Medical Record Number: [id number] Patient Account Number: [id number] Date of Birth/Sex: 1985/06/19 (29 y.o. Female) Treating RN: [name] Primary Care Physician: Felix Pacini Other Clinician: Referring Physician: Felix Pacini Treating Physician/Extender: Rudene Re in Treatment: 11 Information Obtained from: Patient Chief Complaint Patient presents for treatment of an open ulcer due to venous insufficiency. this 29 year old patient has had an ulcer on the lateral part of her right lower extremity and this has been there for 2 weeks. Electronic Signature(s) Signed: 12/10/2014 2:01:26 PM By: Evlyn Kanner MD, FACS Entered By: Evlyn Kanner on 12/10/2014 14:01:26 Kopke, [name] ([id number]) -------------------------------------------------------------------------------- HPI Details Patient Name: [name], [name]. Date of Service: 12/10/2014 1:45 PM Medical Record Number: [id number] Patient Account Number: [id number] Date of Birth/Sex: April 20, 1985 (29 y.o. Female) Treating RN: [name] Primary Care Physician: Felix Pacini Other Clinician: Referring Physician: Felix Pacini Treating Physician/Extender: Rudene Re in Treatment: 11 History of Present Illness Location: the lateral part of her right ankle Quality: Patient reports experiencing a dull pain to affected area(s). Severity: Patient states wound are getting worse. Duration: Patient has had the wound for < 2 weeks prior to presenting for treatment Timing: Pain in wound is Intermittent (comes and goes Context: The wound appeared gradually over time Modifying Factors: Consults to this date include:seen in the ER and asked to follow up with wound care. Associated Signs and Symptoms: Patient reports having difficulty standing for long periods. HPI  Description: 29 year old patient who started with having ulcerations on the right lower leg on the lateral part of her ankle for about 2 weeks. She was seen in the ER at [facility] and advised to see the wound care for a consultation. No X-rays of workup was done during the ER visit and no prescription for any medications of compression wraps were given. the patient is not diabetic but does have hypertension and her medications have been reviewed by me. 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 The right and left greater saphenous vein was found to be tortuous. Deep venous system was also not competent and there was reflux of more than 500 ms. She was then seen by Dr. Tawanna Cooler Early who recommended that the patient would not benefit from endovenous ablation and he had recommended vein stripping on the right side and multiple small phlebectomy procedures on the left side. the patient did not follow-up due to social economic reasons. She has not been wearing any compression stockings and has not taken any specific treatment for varicose veins for the last 3 years. 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 We have obtained some appointments to see the vascular surgeons by the end of the month and the patient would like to follow up with me at my [facility] on Wednesday, June 29. 10/14/2014 -- she could not see me yesterday in [place] and hence has come for a review today. She has a vascular workup to be done this afternoon at [facility]. She is doing fine otherwise. 10/22/2014 -- she was seen by Dr. [name] and he has recommended surgical removal of her right saphenous vein  from distal thigh to saphenofemoral junction  and stab phlebectomy's of multiple large tributary branches throughout her thigh and calf. This would be done under general anesthesia in the outpatient setting. 10/29/2014 -- she is trying to work on a surgical date and in the meanwhile we have got insurance clearance for Apligraf and we will start this next week. 7/22 2016 -- she is here for the first application of Apligraf. TANDY, GRAWE ([id number]) 11/19/2014 -- she is here for a second application of Apligraf 11/26/2014 -- she has done fine after her last application of Apligraf and is awaiting her surgery which is scheduled for August 31. 12/03/2014 -- she is doing fine and is here for her third application of Apligraf. Electronic Signature(s) Signed: 12/10/2014 2:01:35 PM By: Evlyn Kanner MD, FACS Entered By: Evlyn Kanner on 12/10/2014 14:01:34 [name], [name] ([id number]) -------------------------------------------------------------------------------- Physical Exam Details Patient Name: [name], [name]. Date of Service: 12/10/2014 1:45 PM Medical Record Number: [id number] Patient Account Number: [id number] Date of Birth/Sex: [birth year] (29 y.o. Female) Treating RN: [name] Primary Care Physician: Felix Pacini Other Clinician: Referring Physician: Felix Pacini Treating Physician/Extender: Rudene Re in Treatment: 11 Constitutional . Pulse regular. Respirations normal and unlabored. Afebrile. . Eyes Nonicteric. Reactive to light. Ears, Nose, Mouth, and Throat Lips, teeth, and gums WNL.Marland Kitchen Moist mucosa without lesions . Neck supple and nontender. No palpable supraclavicular or cervical adenopathy. Normal sized without goiter. Respiratory Breath sounds WNL, No rubs, rales, rhonchi, or wheeze.. Cardiovascular Pedal Pulses WNL. No clubbing, cyanosis or edema. Lymphatic No adneopathy. No adenopathy. No adenopathy. Musculoskeletal Adexa without tenderness or enlargement.. Digits and nails w/o clubbing,  cyanosis, infection, petechiae, ischemia, or inflammatory conditions.. Integumentary (Hair, Skin) No suspicious lesions. No crepitus or fluctuance. No peri-wound warmth or erythema. No masses.Marland Kitchen Psychiatric Judgement and insight Intact.. No evidence of depression, anxiety, or agitation.. Notes the dressing is intact and has been fairly dry and we will continue with leaving the Apligraf in place and applying an Unna's boot over this. Electronic Signature(s) Signed: 12/10/2014 2:02:08 PM By: Evlyn Kanner MD, FACS Entered By: Evlyn Kanner on 12/10/2014 14:02:07 [name], [name] ([id number]) -------------------------------------------------------------------------------- Physician Orders Details Patient Name: [name], [name]. Date of Service: 12/10/2014 1:45 PM Medical Record Number: [id number] Patient Account Number: [id number] Date of Birth/Sex: 1986/03/24 (28 y.o. Female) Treating RN: [name] Primary Care Physician: Felix Pacini Other Clinician: Referring Physician: Felix Pacini Treating Physician/Extender: Rudene Re in Treatment: 54 Verbal / Phone Orders: Yes Clinician: [name] Read Back and Verified: Yes Diagnosis Coding Wound Cleansing Wound #1 Right,Lateral Malleolus o Cleanse wound with mild soap and water Primary Wound Dressing Wound #1 Right,Lateral Malleolus o Drawtex Secondary Dressing Wound #1 Right,Lateral Malleolus o ABD pad Dressing Change Frequency Wound #1 Right,Lateral Malleolus o Change dressing every week Follow-up Appointments Wound #1 Right,Lateral Malleolus o Return Appointment in 1 week. Edema Control Wound #1 Right,Lateral Malleolus o Unna Boot to Right Lower Extremity Additional Orders / Instructions Wound #1 Right,Lateral Malleolus o Increase protein intake. Electronic Signature(s) Signed: 12/10/2014 2:53:21 PM By: Evlyn Kanner MD, FACS Signed: 12/10/2014 3:54:27 PM By: Elliot Gurney RN, [name], [name], [name] Entered By:  Elliot Gurney, RN, [name], Kim on 12/10/2014 13:58:15 [name], [name] ([id number]) [name], [name] ([id number]) -------------------------------------------------------------------------------- Problem List Details Patient Name: [name], [name]. Date of Service: 12/10/2014 1:45 PM Medical Record Number: [id number] Patient Account Number: [id number] Date of Birth/Sex: December 30, 1985 (28 y.o. 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 Female) Treating RN: [name] Primary Care Physician: Felix Pacini Other Clinician: Referring Physician: Felix Pacini Treating Physician/Extender: Rudene Re in Treatment: 11 Subjective Chief Complaint Information obtained from Patient Patient presents for treatment of an open ulcer due to venous insufficiency. this 29 year old patient has had an ulcer on the lateral part of her right lower extremity and this has been there for 2 weeks. History of Present Illness (HPI) The following HPI elements were documented for the patient's  wound: Location: the lateral part of her right ankle Quality: Patient reports experiencing a dull pain to affected area(s). Severity: Patient states wound are getting worse. Duration: Patient has had the wound for < 2 weeks prior to presenting for treatment Timing: Pain in wound is Intermittent (comes and goes Context: The wound appeared gradually over time Modifying Factors: Consults to this date include:seen in the ER and asked to follow up with wound care. Associated Signs and Symptoms: Patient reports having difficulty standing for long periods. 29 year old patient who started with having ulcerations on the right lower leg on the lateral part of her ankle for about 2 weeks. She was seen in the ER at [facility] and advised to see the wound care for a consultation. No X-rays of workup was done during the ER visit and no prescription for any medications of compression wraps were given. the patient is not diabetic but does have hypertension and her medications have been reviewed by me. 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 The right and left greater saphenous vein was found to be tortuous. Deep venous system was also not competent and there was reflux of more than 500 ms. She was then seen by Dr. Tawanna Cooler Early who recommended that the patient would not benefit from endovenous ablation and he had recommended vein stripping on the right side and multiple small phlebectomy procedures on the left side. the patient did not follow-up due to social economic reasons. She has not been wearing any compression stockings and has not taken any specific treatment for varicose veins for the last 3 years. 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 We have obtained some appointments to see the vascular surgeons by the end of the month and the patient would like to follow up with me at my [facility] on Wednesday, June 29. 10/14/2014 -- she could not see me yesterday in [place] and hence has come for a review today. She EVIN, CHIRCO ([id number]) has a vascular workup to be done this afternoon at [facility]. She is doing fine otherwise. 10/22/2014 -- she was seen by Dr.  [name] and he has recommended surgical removal of her right saphenous vein from distal thigh to saphenofemoral junction and stab phlebectomy's of multiple large tributary branches throughout her thigh and calf. This would be done under general anesthesia in the outpatient setting. 10/29/2014 -- she is trying to work on a surgical date and in the meanwhile we have got insurance clearance for Apligraf and we will start this next week. 7/22 2016 -- she is here for the first application of Apligraf. 11/19/2014 -- she is here for a second application of Apligraf 11/26/2014 -- she has done fine after her last application of Apligraf and is awaiting her surgery which is scheduled for August 31. 12/03/2014 -- she is doing fine and is here for her third application of Apligraf. Objective Constitutional Pulse regular. Respirations normal and unlabored. Afebrile. Vitals Time Taken: 1:47 PM, Height: 66 in, Weight: 350.3 lbs, BMI: 56.5, Temperature: 98.6 F, Pulse: 85 bpm, Respiratory Rate: 18 breaths/min, Blood Pressure: 137/76 mmHg. Eyes Nonicteric. Reactive to light. Ears, Nose, Mouth, and Throat Lips, teeth, and gums WNL.Marland Kitchen Moist mucosa without lesions . Neck supple and nontender. No palpable supraclavicular or cervical adenopathy. Normal sized without goiter. Respiratory Breath sounds WNL, No rubs, rales, rhonchi, or wheeze.. Cardiovascular Pedal Pulses WNL. No clubbing, cyanosis or edema. Lymphatic No adneopathy. No adenopathy. No  adenopathy. Musculoskeletal Adexa without tenderness or enlargement.. Digits and nails w/o clubbing, cyanosis, infection, petechiae, ischemia, or inflammatory conditions.Marland Kitchen JAZLYNNE, MILLINER ([id number]) Psychiatric Judgement and insight Intact.. No evidence of depression, anxiety, or agitation.. General Notes: the dressing is intact and has been fairly dry and we will continue with leaving the Apligraf in place and applying an Unna's boot over this. Integumentary (Hair, Skin) No suspicious lesions. No crepitus or fluctuance. No peri-wound warmth or erythema. No masses.. Wound #1 status is Open. Original cause of wound was Not Known. The wound is located on the Right,Lateral Malleolus. The wound measures 1.5cm length x 1.9cm width x 0.1cm depth; 2.238cm^2 area and 0.224cm^3 volume. General Notes: One week post Apligraf. Measurements used from last visit. 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 I have reassured her about the surgery and though she is tearful she says she will keep an appointment. Once she is discharged from the surgical service she will come back and see me and hence we will not schedule any Apligraf and she is cleared. Plan Wound Cleansing: Wound #1 Right,Lateral Malleolus: Cleanse wound with mild soap and water Primary Wound Dressing: Wound #1 Right,Lateral Malleolus: Drawtex Secondary Dressing: NICHOLLE, FALZON ([id number]) Wound #1 Right,Lateral Malleolus: ABD pad Dressing Change Frequency: Wound #1 Right,Lateral Malleolus: Change dressing every week Follow-up Appointments: Wound #1 Right,Lateral Malleolus: Return Appointment in 1 week. Edema  Control: Wound #1 Right,Lateral Malleolus: Unna Boot to Right Lower Extremity Additional Orders / Instructions: Wound #1 Right,Lateral Malleolus: Increase protein intake. Having left the Apligraf in place and applying an Unna's boot today, she is scheduled for surgery this coming Wednesday. I have reassured her about the surgery and though she is tearful she says she will keep an appointment. Once she is discharged from the surgical service she will come back and see me and hence we will not schedule any  Apligraf and she is cleared. Electronic Signature(s) Signed: 12/10/2014 2:03:15 PM By: Evlyn Kanner MD, FACS Entered By: Evlyn Kanner on 12/10/2014 14:03:14 Mika, [name] ([id number]) -------------------------------------------------------------------------------- SuperBill Details Patient Name: JERILYN, GILLASPIE Date of Service: 12/10/2014 Medical Record Number: [id number] Patient Account Number: [id number] Date of Birth/Sex: [birth year] (28 y.o. 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 E66.01 Morbid (severe) obesity due to excess calories Facility Procedures CPT4 Code: [id number] Description: (Facility Use Only) 29580LT - APPLY UNNA BOOT LT Modifier: Quantity: 1 Physician Procedures CPT4 Code Description: [id number] - WC PHYS LEVEL 3 - EST PT ICD-10 Description Diagnosis I83.013 Varicose veins of right lower extremity with ulcer L97.312 Non-pressure chronic ulcer of right ankle with fat E66.01 Morbid (severe) obesity due to  excess calories Modifier: of ankle layer expose Quantity: 1 d Electronic Signature(s) Signed: 12/10/2014 2:03:28 PM By:  Evlyn Kanner MD, FACS Entered By: Evlyn Kanner on 12/10/2014 14:03:28

## 2014-12-14 ENCOUNTER — Encounter (HOSPITAL_COMMUNITY)
Admission: RE | Admit: 2014-12-14 | Discharge: 2014-12-14 | Disposition: A | Discharge status: Home / Self Care | Payer: BLUE CROSS/BLUE SHIELD | Source: Ambulatory Visit | Attending: Vascular Surgery | Admitting: Vascular Surgery

## 2014-12-14 ENCOUNTER — Encounter (HOSPITAL_COMMUNITY): Payer: Self-pay

## 2014-12-14 DIAGNOSIS — Z79899 Other long term (current) drug therapy: Secondary | ICD-10-CM | POA: Diagnosis not present

## 2014-12-14 DIAGNOSIS — I739 Peripheral vascular disease, unspecified: Secondary | ICD-10-CM | POA: Diagnosis not present

## 2014-12-14 DIAGNOSIS — Z7952 Long term (current) use of systemic steroids: Secondary | ICD-10-CM | POA: Diagnosis not present

## 2014-12-14 DIAGNOSIS — Z6841 Body Mass Index (BMI) 40.0 and over, adult: Secondary | ICD-10-CM | POA: Diagnosis not present

## 2014-12-14 DIAGNOSIS — I83013 Varicose veins of right lower extremity with ulcer of ankle: Secondary | ICD-10-CM | POA: Diagnosis not present

## 2014-12-14 DIAGNOSIS — I10 Essential (primary) hypertension: Secondary | ICD-10-CM | POA: Diagnosis not present

## 2014-12-14 DIAGNOSIS — L97319 Non-pressure chronic ulcer of right ankle with unspecified severity: Secondary | ICD-10-CM | POA: Diagnosis not present

## 2014-12-14 DIAGNOSIS — Z791 Long term (current) use of non-steroidal anti-inflammatories (NSAID): Secondary | ICD-10-CM | POA: Diagnosis not present

## 2014-12-14 HISTORY — DX: Essential (primary) hypertension: I10

## 2014-12-14 LAB — HCG, SERUM, QUALITATIVE: Preg, Serum: NEGATIVE

## 2014-12-14 MED ORDER — CHLORHEXIDINE GLUCONATE CLOTH 2 % EX PADS: 6.0000 | MEDICATED_PAD | Freq: Once | CUTANEOUS | Status: DC

## 2014-12-14 MED ORDER — SODIUM CHLORIDE 0.9 % IV SOLN: INTRAVENOUS | Status: DC

## 2014-12-14 MED ORDER — DEXTROSE 5 % IV SOLN
1.5000 g | INTRAVENOUS | Status: AC
  Administered 2014-12-15: 1.5 g via INTRAVENOUS
  Filled 2014-12-14: qty 1.5

## 2014-12-14 NOTE — Pre-Procedure Instructions (Signed)
    [name]  12/14/2014      [id number] - [place], [place] - [address] AT [facility] [name] [place] [address] Phone: [phone] Fax: [phone]    Your procedure is scheduled on 12/15/14.  Report to [facility] Admitting at 630 A.M.  Call this number if you have problems the morning of surgery:  [phone]   Remember:  Do not eat food or drink liquids after midnight.  Take these medicines the morning of surgery with A SIP OF WATER --tylenol,eye drops,BCP   Do not wear jewelry, make-up or nail polish.  Do not wear lotions, powders, or perfumes.  You may wear deodorant.  Do not shave 48 hours prior to surgery.  Men may shave face and neck.  Do not bring valuables to the hospital.  [facility] is not responsible for any belongings or valuables.  Contacts, dentures or bridgework may not be worn into surgery.  Leave your suitcase in the car.  After surgery it may be brought to your room.  For patients admitted to the hospital, discharge time will be determined by your treatment team.  Patients discharged the day of surgery will not be allowed to drive home.   Name and phone numbr of your driver:    Special instructions:    Please read over the following fact sheets that you were given. Pain Booklet, Coughing and Deep Breathing and Surgical Site Infection Prevention

## 2014-12-14 NOTE — Progress Notes (Signed)
Pt states she had a hole in her as baby but it closed up. States Dr Arbie Cookey is giving her antibiotics per her mothers request.  Will f/u with [facility] z

## 2014-12-14 NOTE — Progress Notes (Signed)
Lab called stating they did not have tubes. Will obtain labs DOS.

## 2014-12-14 NOTE — Progress Notes (Signed)
Anesthesia Chart Review:  Pt is 29 year old female scheduled for R saphenous vein ligation and stripping with Dr. Arbie Cookey on 12/15/2014.  PMH includes: HTN. 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 Does not see cardiology. Never smoker. BMI 55.  Reported to PAT RN heavy marijuana use. RN felt pt was high during PAT visit.   Medications include: HCTZ, amoxicillin.   Preoperative labs not obtained, will need to be drawn DOS.   EKG 12/14/2014: NSR. Possible Left atrial enlargement. T wave abnormality, consider inferior ischemia. Prolonged QT.  Echo 07/07/2002: - The left ventricle was mildly dilated. Overall left ventricular systolic function was normal. Left ventricular ejection fraction was estimated , range being 55 % to 65 %. - There was mild aortic root dilatation. - There was a mild mitral valve prolapse. - The pulmonary veins were grossly normal. IMPRESSIONS - No evidence for bicuspid aortic valve but borderline mitral valve prolapse observed along with mild dilatation of LV and aortic root. - Disposition: Recheck in 2 years. No restrictions other than SBE prophyllaxis for dental/surgical work.  Reviewed case with Dr. Michelle Piper.   If labs acceptable DOS, I anticipate pt can proceed as scheduled.   Rica Mast, FNP-BC [facility] Short Stay Surgical Center/Anesthesiology Phone: [phone] 12/14/2014 4:12 PM

## 2014-12-15 ENCOUNTER — Encounter (HOSPITAL_COMMUNITY): Payer: Self-pay | Admitting: *Deleted

## 2014-12-15 ENCOUNTER — Telehealth: Payer: Self-pay | Admitting: Vascular Surgery

## 2014-12-15 ENCOUNTER — Ambulatory Visit (HOSPITAL_COMMUNITY): Payer: BLUE CROSS/BLUE SHIELD | Admitting: Certified Registered Nurse Anesthetist

## 2014-12-15 ENCOUNTER — Encounter (HOSPITAL_COMMUNITY)
Admission: RE | Disposition: A | Discharge status: Home / Self Care | Payer: Self-pay | Source: Ambulatory Visit | Attending: Vascular Surgery

## 2014-12-15 ENCOUNTER — Ambulatory Visit (HOSPITAL_COMMUNITY): Payer: BLUE CROSS/BLUE SHIELD | Admitting: Emergency Medicine

## 2014-12-15 ENCOUNTER — Ambulatory Visit (HOSPITAL_COMMUNITY)
Admission: RE | Admit: 2014-12-15 | Discharge: 2014-12-15 | Disposition: A | Discharge status: Home / Self Care | Payer: BLUE CROSS/BLUE SHIELD | Source: Ambulatory Visit | Attending: Vascular Surgery | Admitting: Vascular Surgery

## 2014-12-15 DIAGNOSIS — I739 Peripheral vascular disease, unspecified: Secondary | ICD-10-CM | POA: Insufficient documentation

## 2014-12-15 DIAGNOSIS — Z791 Long term (current) use of non-steroidal anti-inflammatories (NSAID): Secondary | ICD-10-CM | POA: Insufficient documentation

## 2014-12-15 DIAGNOSIS — Z79899 Other long term (current) drug therapy: Secondary | ICD-10-CM | POA: Insufficient documentation

## 2014-12-15 DIAGNOSIS — I87331 Chronic venous hypertension (idiopathic) with ulcer and inflammation of right lower extremity: Secondary | ICD-10-CM | POA: Diagnosis not present

## 2014-12-15 DIAGNOSIS — I10 Essential (primary) hypertension: Secondary | ICD-10-CM | POA: Insufficient documentation

## 2014-12-15 DIAGNOSIS — Z7952 Long term (current) use of systemic steroids: Secondary | ICD-10-CM | POA: Insufficient documentation

## 2014-12-15 DIAGNOSIS — Z6841 Body Mass Index (BMI) 40.0 and over, adult: Secondary | ICD-10-CM | POA: Insufficient documentation

## 2014-12-15 DIAGNOSIS — I83891 Varicose veins of right lower extremities with other complications: Secondary | ICD-10-CM | POA: Diagnosis not present

## 2014-12-15 DIAGNOSIS — I83013 Varicose veins of right lower extremity with ulcer of ankle: Secondary | ICD-10-CM | POA: Insufficient documentation

## 2014-12-15 DIAGNOSIS — L97319 Non-pressure chronic ulcer of right ankle with unspecified severity: Secondary | ICD-10-CM | POA: Insufficient documentation

## 2014-12-15 HISTORY — PX: VEIN LIGATION AND STRIPPING: SHX2653

## 2014-12-15 LAB — CBC
HEMATOCRIT: 36.2 % (ref 36.0–46.0)
Hemoglobin: 11.6 g/dL — ABNORMAL LOW (ref 12.0–15.0)
MCH: 28.6 pg (ref 26.0–34.0)
MCHC: 32 g/dL (ref 30.0–36.0)
MCV: 89.4 fL (ref 78.0–100.0)
Platelets: 263 10*3/uL (ref 150–400)
RBC: 4.05 MIL/uL (ref 3.87–5.11)
RDW: 13.8 % (ref 11.5–15.5)
WBC: 7 10*3/uL (ref 4.0–10.5)

## 2014-12-15 LAB — BASIC METABOLIC PANEL
Anion gap: 9 (ref 5–15)
BUN: 11 mg/dL (ref 6–20)
CHLORIDE: 103 mmol/L (ref 101–111)
CO2: 25 mmol/L (ref 22–32)
CREATININE: 0.85 mg/dL (ref 0.44–1.00)
Calcium: 8.4 mg/dL — ABNORMAL LOW (ref 8.9–10.3)
GFR calc non Af Amer: >60 mL/min (ref >60)
Glucose, Bld: 91 mg/dL (ref 65–99)
POTASSIUM: 3.7 mmol/L (ref 3.5–5.1)
Sodium: 137 mmol/L (ref 135–145)

## 2014-12-15 SURGERY — LIGATION AND STRIPPING, VARICOSE VEIN
Anesthesia: General | Laterality: Right

## 2014-12-15 MED ORDER — OXYCODONE HCL 5 MG PO TABS
ORAL_TABLET | ORAL | Status: AC
  Administered 2014-12-15: 10 mg via ORAL
  Filled 2014-12-15: qty 2

## 2014-12-15 MED ORDER — ACETAMINOPHEN 325 MG PO TABS
650.0000 mg | ORAL_TABLET | Freq: Once | ORAL | Status: AC
  Administered 2014-12-15: 650 mg via ORAL
  Filled 2014-12-15: qty 2

## 2014-12-15 MED ORDER — LACTATED RINGERS IV SOLN
INTRAVENOUS | Status: DC | PRN
  Administered 2014-12-15: 08:00:00 via INTRAVENOUS

## 2014-12-15 MED ORDER — SUCCINYLCHOLINE CHLORIDE 20 MG/ML IJ SOLN
INTRAMUSCULAR | Status: DC | PRN
  Administered 2014-12-15: 140 mg via INTRAVENOUS

## 2014-12-15 MED ORDER — PROMETHAZINE HCL 25 MG/ML IJ SOLN: 6.2500 mg | INTRAMUSCULAR | Status: DC | PRN

## 2014-12-15 MED ORDER — PROPOFOL 10 MG/ML IV BOLUS
INTRAVENOUS | Status: AC
  Filled 2014-12-15: qty 20

## 2014-12-15 MED ORDER — ONDANSETRON HCL 4 MG/2ML IJ SOLN
INTRAMUSCULAR | Status: DC | PRN
Start: 2014-12-15 — End: 2014-12-15
  Administered 2014-12-15: 4 mg via INTRAVENOUS

## 2014-12-15 MED ORDER — FENTANYL CITRATE (PF) 250 MCG/5ML IJ SOLN
INTRAMUSCULAR | Status: AC
  Filled 2014-12-15: qty 5

## 2014-12-15 MED ORDER — PROPOFOL 10 MG/ML IV BOLUS
INTRAVENOUS | Status: DC | PRN
  Administered 2014-12-15: 200 mg via INTRAVENOUS
  Administered 2014-12-15: 50 mg via INTRAVENOUS

## 2014-12-15 MED ORDER — 0.9 % SODIUM CHLORIDE (POUR BTL) OPTIME
TOPICAL | Status: DC | PRN
  Administered 2014-12-15: 1000 mL

## 2014-12-15 MED ORDER — PHENYLEPHRINE 40 MCG/ML (10ML) SYRINGE FOR IV PUSH (FOR BLOOD PRESSURE SUPPORT)
PREFILLED_SYRINGE | INTRAVENOUS | Status: AC
  Filled 2014-12-15: qty 10

## 2014-12-15 MED ORDER — LIDOCAINE HCL (CARDIAC) 20 MG/ML IV SOLN
INTRAVENOUS | Status: DC | PRN
  Administered 2014-12-15: 100 mg via INTRAVENOUS

## 2014-12-15 MED ORDER — FENTANYL CITRATE (PF) 100 MCG/2ML IJ SOLN
INTRAMUSCULAR | Status: DC | PRN
  Administered 2014-12-15 (×4): 50 ug via INTRAVENOUS
  Administered 2014-12-15: 100 ug via INTRAVENOUS
  Administered 2014-12-15 (×2): 50 ug via INTRAVENOUS

## 2014-12-15 MED ORDER — FENTANYL CITRATE (PF) 100 MCG/2ML IJ SOLN
25.0000 ug | INTRAMUSCULAR | Status: DC | PRN
  Administered 2014-12-15 (×4): 25 ug via INTRAVENOUS

## 2014-12-15 MED ORDER — OXYCODONE HCL 5 MG PO TABS
10.0000 mg | ORAL_TABLET | Freq: Once | ORAL | Status: AC
  Administered 2014-12-15: 10 mg via ORAL

## 2014-12-15 MED ORDER — FENTANYL CITRATE (PF) 100 MCG/2ML IJ SOLN
INTRAMUSCULAR | Status: AC
  Administered 2014-12-15: 25 ug via INTRAVENOUS
  Filled 2014-12-15: qty 2

## 2014-12-15 MED ORDER — MIDAZOLAM HCL 2 MG/2ML IJ SOLN
INTRAMUSCULAR | Status: AC
  Filled 2014-12-15: qty 4

## 2014-12-15 MED ORDER — ACETAMINOPHEN 325 MG PO TABS
ORAL_TABLET | ORAL | Status: AC
  Administered 2014-12-15: 650 mg via ORAL
  Filled 2014-12-15: qty 2

## 2014-12-15 MED ORDER — MIDAZOLAM HCL 5 MG/5ML IJ SOLN
INTRAMUSCULAR | Status: DC | PRN
  Administered 2014-12-15: 2 mg via INTRAVENOUS

## 2014-12-15 MED ORDER — OXYCODONE HCL 5 MG PO TABS: 5.0000 mg | ORAL_TABLET | Freq: Four times a day (QID) | ORAL | Status: DC | PRN

## 2014-12-15 SURGICAL SUPPLY — 51 items
BAG ISOLATION DRAPE 18X18 (DRAPES) ×1 IMPLANT
BANDAGE ELASTIC 6 VELCRO ST LF (GAUZE/BANDAGES/DRESSINGS) ×3 IMPLANT
BENZOIN TINCTURE PRP APPL 2/3 (GAUZE/BANDAGES/DRESSINGS) ×3 IMPLANT
BLADE 10 SAFETY STRL DISP (BLADE) ×3 IMPLANT
BLADE SURG 11 STRL SS (BLADE) ×3 IMPLANT
BNDG ELASTIC 6X15 VLCR STRL LF (GAUZE/BANDAGES/DRESSINGS) ×3 IMPLANT
BNDG GAUZE ELAST 4 BULKY (GAUZE/BANDAGES/DRESSINGS) ×3 IMPLANT
CANISTER SUCTION 2500CC (MISCELLANEOUS) ×3 IMPLANT
CLIP LIGATING EXTRA MED SLVR (CLIP) ×3 IMPLANT
CLIP LIGATING EXTRA SM BLUE (MISCELLANEOUS) ×3 IMPLANT
CLOSURE WOUND 1/2 X4 (GAUZE/BANDAGES/DRESSINGS) ×1
COVER PROBE W GEL 5X96 (DRAPES) ×3 IMPLANT
COVER SURGICAL LIGHT HANDLE (MISCELLANEOUS) ×3 IMPLANT
DRAPE INCISE IOBAN 66X45 STRL (DRAPES) ×3 IMPLANT
DRAPE ISOLATION BAG 18X18 (DRAPES) ×2
DRSG COVADERM 4X6 (GAUZE/BANDAGES/DRESSINGS) ×3 IMPLANT
DRSG PAD ABDOMINAL 8X10 ST (GAUZE/BANDAGES/DRESSINGS) ×6 IMPLANT
ELECT REM PT RETURN 9FT ADLT (ELECTROSURGICAL) ×3
ELECTRODE REM PT RTRN 9FT ADLT (ELECTROSURGICAL) ×1 IMPLANT
GLOVE BIO SURGEON STRL SZ 6.5 (GLOVE) ×6 IMPLANT
GLOVE BIO SURGEONS STRL SZ 6.5 (GLOVE) ×3
GLOVE BIOGEL PI IND STRL 7.0 (GLOVE) ×1 IMPLANT
GLOVE BIOGEL PI INDICATOR 7.0 (GLOVE) ×2
GLOVE SS BIOGEL STRL SZ 7.5 (GLOVE) ×1 IMPLANT
GLOVE SUPERSENSE BIOGEL SZ 7.5 (GLOVE) ×2
GLOVE SURG SS PI 7.0 STRL IVOR (GLOVE) ×3 IMPLANT
GOWN STRL REUS W/ TWL LRG LVL3 (GOWN DISPOSABLE) ×3 IMPLANT
GOWN STRL REUS W/TWL LRG LVL3 (GOWN DISPOSABLE) ×6
KIT BASIN OR (CUSTOM PROCEDURE TRAY) ×3 IMPLANT
KIT ROOM TURNOVER OR (KITS) ×3 IMPLANT
LOOP VESSEL MAXI BLUE (MISCELLANEOUS) ×3 IMPLANT
NS IRRIG 1000ML POUR BTL (IV SOLUTION) ×3 IMPLANT
PACK GENERAL/GYN (CUSTOM PROCEDURE TRAY) ×3 IMPLANT
PACK UNIVERSAL I (CUSTOM PROCEDURE TRAY) ×3 IMPLANT
PAD ARMBOARD 7.5X6 YLW CONV (MISCELLANEOUS) ×6 IMPLANT
SPONGE GAUZE 4X4 12PLY STER LF (GAUZE/BANDAGES/DRESSINGS) ×3 IMPLANT
STOCKINETTE IMPERVIOUS LG (DRAPES) ×3 IMPLANT
STRIP CLOSURE SKIN 1/2X4 (GAUZE/BANDAGES/DRESSINGS) ×2 IMPLANT
SUT SILK 2 0 (SUTURE) ×2
SUT SILK 2 0 SH (SUTURE) IMPLANT
SUT SILK 2-0 18XBRD TIE 12 (SUTURE) ×1 IMPLANT
SUT SILK 3 0 (SUTURE) ×2
SUT SILK 3-0 18XBRD TIE 12 (SUTURE) ×1 IMPLANT
SUT VIC AB 3-0 SH 27 (SUTURE) ×4
SUT VIC AB 3-0 SH 27X BRD (SUTURE) ×2 IMPLANT
SUT VIC AB 3-0 SH 8-18 (SUTURE) ×3 IMPLANT
SUT VICRYL 4-0 PS2 18IN ABS (SUTURE) ×6 IMPLANT
SUT VICRYL AB 3 0 TIES (SUTURE) ×3 IMPLANT
UNDERPAD 30X30 INCONTINENT (UNDERPADS AND DIAPERS) ×3 IMPLANT
VEIN STRIPPER DISP (MISCELLANEOUS) ×3 IMPLANT
WATER STERILE IRR 1000ML POUR (IV SOLUTION) ×3 IMPLANT

## 2014-12-15 NOTE — Anesthesia Procedure Notes (Signed)
Procedure Name: Intubation Date/Time: 12/15/2014 8:51 AM Performed by: Adonis Housekeeper Pre-anesthesia Checklist: Patient identified, Emergency Drugs available, Suction available and Patient being monitored Patient Re-evaluated:Patient Re-evaluated prior to inductionOxygen Delivery Method: Circle system utilized Preoxygenation: Pre-oxygenation with 100% oxygen Intubation Type: IV induction Ventilation: Mask ventilation without difficulty Laryngoscope Size: Glidescope and 3 Grade View: Grade I Tube type: Oral Tube size: 7.0 mm Number of attempts: 1 Airway Equipment and Method: Stylet,  Patient positioned with wedge pillow and Lighted stylet Placement Confirmation: ETT inserted through vocal cords under direct vision,  positive ETCO2 and breath sounds checked- equal and bilateral Secured at: 23 cm Tube secured with: Tape Dental Injury: Teeth and Oropharynx as per pre-operative assessment  Difficulty Due To: Difficulty was anticipated

## 2014-12-15 NOTE — Anesthesia Preprocedure Evaluation (Addendum)
Anesthesia Evaluation  Patient identified by MRN, date of birth, ID band Patient awake    Reviewed: Allergy & Precautions, NPO status , Patient's Chart, lab work & pertinent test results  History of Anesthesia Complications Negative for: history of anesthetic complications  Airway Mallampati: III  TM Distance: >3 FB Neck ROM: Full    Dental  (+) Teeth Intact, Dental Advisory Given   Pulmonary  Asthma as a child; Currently smokes marijuana breath sounds clear to auscultation  Pulmonary exam normal       Cardiovascular Exercise Tolerance: Good hypertension, Pt. on medications + Peripheral Vascular Disease Normal cardiovascular examRhythm:Regular Rate:Normal     Neuro/Psych negative neurological ROS  negative psych ROS   GI/Hepatic negative GI ROS, Neg liver ROS,   Endo/Other  Super morbid obesity  Renal/GU negative Renal ROS     Musculoskeletal negative musculoskeletal ROS (+)   Abdominal   Peds  Hematology negative hematology ROS (+)   Anesthesia Other Findings Day of surgery medications reviewed with the patient.  Reproductive/Obstetrics                            Anesthesia Physical Anesthesia Plan  ASA: III  Anesthesia Plan: General   Post-op Pain Management:    Induction: Intravenous  Airway Management Planned: Oral ETT  Additional Equipment:   Intra-op Plan:   Post-operative Plan: Extubation in OR  Informed Consent: I have reviewed the patients History and Physical, chart, labs and discussed the procedure including the risks, benefits and alternatives for the proposed anesthesia with the patient or authorized representative who has indicated his/her understanding and acceptance.   Dental advisory given  Plan Discussed with: CRNA  Anesthesia Plan Comments: (Risks/benefits of general anesthesia discussed with patient including risk of damage to teeth, lips, gum, and  tongue, nausea/vomiting, allergic reactions to medications, and the possibility of heart attack, stroke and death.  All patient questions answered.  Patient wishes to proceed.)        Anesthesia Quick Evaluation

## 2014-12-15 NOTE — Anesthesia Postprocedure Evaluation (Signed)
  Anesthesia Post-op Note  Patient: [name]  Procedure(s) Performed: Procedure(s) (LRB): SAPHENOUS VEIN LIGATION AND STRIPPING; GREATER THAN 20 STAB PHLEBECTOMIES (Right)  Patient Location: PACU  Anesthesia Type: General  Level of Consciousness: awake and alert   Airway and Oxygen Therapy: Patient Spontanous Breathing  Post-op Pain: mild  Post-op Assessment: Post-op Vital signs reviewed, Patient's Cardiovascular Status Stable, Respiratory Function Stable, Patent Airway and No signs of Nausea or vomiting  Last Vitals:  Filed Vitals:   12/15/14 1258  BP: 120/67  Pulse: 89  Temp: 36.9 C  Resp: 22    Post-op Vital Signs: stable   Complications: No apparent anesthesia complications

## 2014-12-15 NOTE — H&P (Signed)
Progress Notes   [name] (MR# [id number])      Progress Notes Info    Author Note Status Last Update User Last Update Date/Time   [name] Earthly, MD Signed [name] Earthly, MD 10/19/2014 3:12 PM    Progress Notes    Expand All Collapse All       Patient name: [name] Waldrop SummersMRN: [id number] DOB: 19-Sep-1987Sex: female   Referred by: [name]  Reason for referral:  Chief Complaint  Patient presents with  . New Evaluation    c/o right ankle ulcer for 6 weeks, been treated at [facility] for 4 weeks currently wearing [employer]   . Varicose Veins    HISTORY OF PRESENT ILLNESS: Patient presents today for continued discussion of severe venous hypertension. I had seen her 3 years ago at which time she had had a bleed around her ankle from venous hypertension on the right side. She did have marked varicosities in both lower extremities with more discomfort in the right than the left. I did discuss the option of vein stripping and stab phlebectomy as treatment for venous hypertension. She had not followed up in about 3 years. One month ago she developed right lateral malleolus venous ulcer. She did not recall any specific trauma to this area. She has been treated in  wound center with the Unna boot for approximately one month. She has had no further episodes of bleeding. He does have pain associated with this as well.  Past Medical History  Diagnosis Date  . Obesity   . Varicose veins   . Ankle ulcer     Past Surgical History  Procedure Laterality Date  . Tonsillectomy      History   Social History  . Marital Status: Single    Spouse Name: N/A  . Number of Children: N/A  . Years of Education: N/A   Occupational History  . Not on file.   Social History Main Topics  . Smoking status: Never Smoker   . Smokeless tobacco: Never Used  . Alcohol Use: 0.6 oz/week      1 Glasses of wine per week     Comment: occasional  . Drug Use: Yes    Special: Marijuana  . Sexual Activity: Yes    Birth Control/ Protection: Condom   Other Topics Concern  . Not on file   Social History Narrative    Family History  Problem Relation Age of Onset  . Diabetes Mother   . Varicose Veins Mother   . Varicose Veins Sister     Allergies as of 10/19/2014  . (No Known Allergies)    Current Outpatient Prescriptions on File Prior to Visit  Medication Sig Dispense Refill  . acetaminophen (TYLENOL) 500 MG tablet Take 500 mg by mouth every 6 (six) hours as needed (for pain.).    Marland Kitchen cyclobenzaprine (FLEXERIL) 5 MG tablet Take 1 tablet (5 mg total) by mouth 3 (three) times daily as needed for muscle spasms. 30 tablet 0  . cyclopentolate (CYCLOGYL) 1 % ophthalmic solution Place 1 drop into the right eye 2 (two) times daily. 2 mL 0  . fluconazole (DIFLUCAN) 150 MG tablet Take 1 tablet (150 mg total) by mouth once. 1 tablet 0  . hydrochlorothiazide (HYDRODIURIL) 12.5 MG tablet Take 1 tablet (12.5 mg total) by mouth daily. 90 tablet 2  . ibuprofen (ADVIL,MOTRIN) 600 MG tablet Take 1 tablet (600 mg total) by mouth every 6 (six) hours as needed. 30 tablet 0  .  metroNIDAZOLE (FLAGYL) 500 MG tablet Take 1 tablet (500 mg total) by mouth 2 (two) times daily. 14 tablet 0  . naproxen (NAPROSYN) 500 MG tablet Take 1 tablet (500 mg total) by mouth 2 (two) times daily with a meal. 30 tablet 0  . norgestimate-ethinyl estradiol (SPRINTEC 28) 0.25-35 MG-MCG tablet Take 1 tablet by mouth daily. 1 Package 11  . prednisoLONE acetate (PRED FORTE) 1 % ophthalmic suspension Place 1 drop into the right eye 2 (two) times daily.    Marland Kitchen trimethoprim-polymyxin b (POLYTRIM) ophthalmic solution Place 1 drop into the right eye every 6 (six) hours.     No current facility-administered  medications on file prior to visit.      PHYSICAL EXAMINATION:  General: The patient is a well-nourished female, in no acute distress. Morbidly obese Vital signs are BP 112/79 mmHg  Pulse 84  Temp(Src) 98.6 F (37 C) (Oral)  Resp 18  Ht  (1.676 m)  Wt 343 lb (155.584 kg)  BMI 55.39 kg/m2 Pulmonary: There is a good air exchange  Musculoskeletal: There are no major deformities. There is no significant extremity pain. Neurologic: No focal weakness or paresthesias are detected, Skin: Marked changes of venous hypertension bilaterally. Multiple very superficial telangiectasia around her left ankle. On the right she does have a 1 x 2 cm ulcer over her lateral malleolus with clean granulating base. Extensive hemosiderin deposits and thickening circumferentially around her right ankle. Psychiatric: The patient has normal affect. Cardiovascular: 2+ dorsalis pedis pulses bilaterally Multiple large varicosities throughout her medial thigh extensive varicosities in her posterior calf   [facility] Vascular Lab Studies:  Ordered and Independently Reviewed this revealed tortuosity and reflux in her extremely large saphenous vein throughout her proximal and mid thigh. Diameters greater than 1 cm throughout its course. Interestingly she has no evidence of deep venous reflux in her right leg. Does have some left leg venous reflux  Impression and Plan:  Progression of severe venous hypertension bilaterally right greater than left lower extremity. Now with venous ulceration which is not healing with maximal treatment with weekly Unna boot. I reimaged her veins. I do not feel she is a candidate for ablation due to the very large size and extreme tortuosity. Also with her morbid obesity weighing 340 pounds and 5 foot 6 inches in height and very difficult to do this under local anesthesia. I have recommended surgical removal of this segment of her saphenous vein from distal thigh to saphenofemoral junction  and stab phlebectomy of multiple large tributary branches throughout her thigh and calf. Explained this would be done under general anesthesia as an outpatient at the hospital. She wishes to proceed as soon as possible.    [name] [facility] Office: [phone]                 Addendum:  The patient has been re-examined and re-evaluated.  The patient's history and physical has been reviewed and is unchanged.    REBBECCA OSUNA is a 29 y.o. female is being admitted with Right ankle ulcer L97.309. All the risks, benefits and other treatment options have been discussed with the patient. The patient has consented to proceed with Procedure(s): SAPHENOUS VEIN LIGATION AND STRIPPING; GREATER THAN 20 STAB PHLEBECTOMIES as a surgical intervention.  [name] 12/15/2014 8:26 AM [facility]

## 2014-12-15 NOTE — Telephone Encounter (Signed)
-----   Message from Sharee Pimple, RN sent at 12/15/2014 10:52 AM EDT ----- Regarding: Schedule   ----- Message -----    From: Dara Lords, PA-C    Sent: 12/15/2014  10:26 AM      To: [facility] Charge Pool  S/p right leg vein stripping 12/15/14.  F/u with Dr. Arbie Cookey in 2 weeks.  Thanks, Lelon Mast

## 2014-12-15 NOTE — Op Note (Signed)
    OPERATIVE REPORT  DATE OF SURGERY: 12/15/2014  PATIENT: [name], 29 y.o. female MRN: [id number]  DOB: February 10, 1986  PRE-OPERATIVE DIAGNOSIS: Severe venous hypertension with venous ulceration  POST-OPERATIVE DIAGNOSIS:  Same  PROCEDURE: #1 ligation and stripping of right great saphenous vein from distal thigh to saphenofemoral junction, #2 stab phlebectomy of large tributary varicose veins in the thigh popliteal space and calf  SURGEON:  [name], M.D.  PHYSICIAN ASSISTANT: Samantha Rhyne PA-C  ANESTHESIA:  Gen.  EBL: 100 ml  Total I/O In: 800 [I.V.:800] Out: -   BLOOD ADMINISTERED: None  DRAINS: None  SPECIMEN: None  COUNTS CORRECT:  YES  PLAN OF CARE: PACU   PATIENT DISPOSITION:  PACU - hemodynamically stable  PROCEDURE DETAILS: Patient was taken to the operative placed supine dysrhythmia the right leg and groin were prepped and draped in usual sterile fashion. The patient had been marked preoperatively while standing to the marked area of the large varicosities in her leg. SonoSite ultrasound was used to visualize the saphenous vein. This was extremely dilated from the saphenofemoral junction down through the thigh. Branch and multiple branches in the distal thigh. Incision was made over the saphenofemoral junction at the groin and also over the area of the saphenous vein at the distal thigh. The vein was clearly 2 cm in diameter. It was doubly ligated at the saphenofemoral junction with 2-0 silk ties. Tributary branches arising from the saphenous vein in the distal thigh were doubly ligated with 20 ties as well. The vein was opened and a vein stripper was passed through the saphenous vein from distal thigh up to the saphenofemoral junction.  Was placed on this vein was removed with the stripper. Pressure was held for hemostasis. Next using 11 blade multiple stabs were made over the multiple varicosities in the thigh popliteal space and calf. Stab avulsion  technique was used to remove these varicosities. Pressure was held for hemostasis. The patient was placed in Trendelenburg position for this. The wounds were irrigated with saline. The 2 incisions in the groin and thigh were closed with 3-0 Monocryl in the subcutaneous and subcuticular tissue. Benzoin insertion for applied to these incisions and also benzoin Steri-Strips were applied to all the stab phlebectomy sites. A Kerlix and Ace wrap were placed from the ankle to the groin. The patient was transferred to the recovery room in stable condition   [name], M.D. 12/15/2014 2:16 PM

## 2014-12-15 NOTE — Transfer of Care (Signed)
Immediate Anesthesia Transfer of Care Note  Patient: [name]  Procedure(s) Performed: Procedure(s): SAPHENOUS VEIN LIGATION AND STRIPPING; GREATER THAN 20 STAB PHLEBECTOMIES (Right)  Patient Location: PACU  Anesthesia Type:General  Level of Consciousness: awake, alert , oriented and patient cooperative  Airway & Oxygen Therapy: Patient Spontanous Breathing and Patient connected to face mask oxygen  Post-op Assessment: Report given to RN, Post -op Vital signs reviewed and stable, Patient moving all extremities and Patient moving all extremities X 4  Post vital signs: Reviewed and stable  Last Vitals:  Filed Vitals:   12/15/14 0658  BP: 107/68  Pulse: 87  Temp: 36.9 C  Resp: 20    Complications: No apparent anesthesia complications

## 2014-12-16 ENCOUNTER — Encounter (HOSPITAL_COMMUNITY): Payer: Self-pay | Admitting: Vascular Surgery

## 2014-12-17 ENCOUNTER — Encounter: Payer: BLUE CROSS/BLUE SHIELD | Attending: Surgery | Admitting: Surgery

## 2014-12-17 DIAGNOSIS — L97312 Non-pressure chronic ulcer of right ankle with fat layer exposed: Secondary | ICD-10-CM | POA: Insufficient documentation

## 2014-12-17 DIAGNOSIS — R600 Localized edema: Secondary | ICD-10-CM | POA: Insufficient documentation

## 2014-12-17 DIAGNOSIS — I10 Essential (primary) hypertension: Secondary | ICD-10-CM | POA: Insufficient documentation

## 2014-12-17 DIAGNOSIS — I83013 Varicose veins of right lower extremity with ulcer of ankle: Secondary | ICD-10-CM | POA: Insufficient documentation

## 2014-12-17 NOTE — Progress Notes (Signed)
BEAUTIFULL, CISAR ([id number]) Visit Report for 12/17/2014 Arrival Information Details Patient Name: [name], [name]. Date of Service: 12/17/2014 1:00 PM Medical Record Number: [id number] Patient Account Number: [id number] Date of Birth/Sex: [birth year] (29 y.o. Female) Treating RN: [name] Primary Care Physician: Felix Pacini Other Clinician: Referring Physician: Felix Pacini Treating Physician/Extender: Rudene Re in Treatment: 12 Visit Information History Since Last Visit Added or deleted any medications: Yes Patient Arrived: Ambulatory Any new allergies or adverse reactions: No Arrival Time: 13:15 Had a fall or experienced change in No Accompanied By: self activities of daily living that may affect Transfer Assistance: None risk of falls: Patient Identification Verified: Yes Signs or symptoms of abuse/neglect since last No Secondary Verification Process Yes visito Completed: Hospitalized since last visit: No Patient Has Alerts: Yes Pain Present Now: Yes Patient Alerts: 09/2013 ABI R: 1.3 Electronic Signature(s) Signed: 12/17/2014 2:56:30 PM By: Elliot Gurney, RN, [name], [name], [name] Entered By: Elliot Gurney, RN, [name], Kim on 12/17/2014 13:16:11 Pettengill, [name] -------------------------------------------------------------------------------- General Visit Notes Details Patient Name: [name], [name] Date of Service: 12/17/2014 1:00 PM Medical Record Number: [id number] Patient Account Number: [id number] Date of Birth/Sex: 25-Jul-1985 (29 y.o. Female) Treating RN: [name] Primary Care Physician: Felix Pacini Other Clinician: Referring Physician: Felix Pacini Treating Physician/Extender: Rudene Re in Treatment: 12 Notes Patient came in today post surgical procedure. Her procedure was Wednesday (12/15/2014). She was wrapped in a ace wrap up to her thigh. Patient was instructed by surgeon to remained wrapped for 72 hours. Dr. Meyer Russel spoke with [name] and  decided it best not to unwrap her today. Patient will return on Tuesday for a MD visit. Patient understands and agrees. Electronic Signature(s) Signed: 12/17/2014 2:56:30 PM By: Elliot Gurney, RN, [name], [name], [name] Entered By: Elliot Gurney, RN, [name], Kim on 12/17/2014 13:37:44 Spear, [name] ([id number]) -------------------------------------------------------------------------------- Pain Assessment Details Patient Name: [name], [name]. Date of Service: 12/17/2014 1:00 PM Medical Record Number: [id number] Patient Account Number: [id number] Date of Birth/Sex: October 18, 1985 (29 y.o. Female) Treating RN: [name] Primary Care Physician: Felix Pacini Other Clinician: Referring Physician: Felix Pacini Treating Physician/Extender: Rudene Re in Treatment: 12 Active Problems Location of Pain Severity and Description of Pain Patient Has Paino Yes Site Locations Duration of the Pain. Constant / Intermittento Constant Rate the pain. Current Pain Level: 6 Worst Pain Level: 10 Character of Pain Describe the Pain: Burning Pain Management and Medication Current Pain Management: Electronic Signature(s) Signed: 12/17/2014 2:56:30 PM By: Elliot Gurney, RN, [name], [name], [name] Entered By: Elliot Gurney, RN, [name], Kim on 12/17/2014 13:18:04 Golob, [name] ([id number]) -------------------------------------------------------------------------------- Vitals Details Patient Name: [name], [name]. Date of Service: 12/17/2014 1:00 PM Medical Record Number: [id number] Patient Account Number: [id number] Date of Birth/Sex: 02/11/1986 (29 y.o. Female) Treating RN: [name] Primary Care Physician: Felix Pacini Other Clinician: Referring Physician: Felix Pacini Treating Physician/Extender: Rudene Re in Treatment: 12 Vital Signs Time Taken: 13:17 Temperature (F): 98.6 Height (in): 66 Pulse (bpm): 88 Weight (lbs): 350.3 Respiratory Rate (breaths/min): 18 Body Mass Index (BMI): 56.5 Blood Pressure (mmHg):  122/69 Reference Range: 80 - 120 mg / dl Electronic Signature(s) Signed: 12/17/2014 2:56:30 PM By: Elliot Gurney, RN, [name], [name], [name] Entered By: Elliot Gurney, RN, [name], Kim on 12/17/2014 13:18:31

## 2014-12-21 ENCOUNTER — Encounter: Payer: BLUE CROSS/BLUE SHIELD | Admitting: Surgery

## 2014-12-21 ENCOUNTER — Telehealth: Payer: Self-pay

## 2014-12-21 DIAGNOSIS — R600 Localized edema: Secondary | ICD-10-CM | POA: Diagnosis not present

## 2014-12-21 DIAGNOSIS — L97312 Non-pressure chronic ulcer of right ankle with fat layer exposed: Secondary | ICD-10-CM | POA: Diagnosis not present

## 2014-12-21 DIAGNOSIS — I10 Essential (primary) hypertension: Secondary | ICD-10-CM | POA: Diagnosis not present

## 2014-12-21 DIAGNOSIS — I83013 Varicose veins of right lower extremity with ulcer of ankle: Secondary | ICD-10-CM | POA: Diagnosis not present

## 2014-12-21 NOTE — Progress Notes (Addendum)
[name] ([id number]) Visit Report for 12/21/2014 Chief Complaint Document Details Patient Name: [name], [name]. Date of Service: 12/21/2014 1:45 PM Medical Record Patient Account Number: [id number] Number: [name], RN, [name], Treating RN: 1986-01-20 (28 y.o. [place] Sink Date of Birth/Sex: Female) Other Clinician: Primary Care Physician: KUNEFF, RENEE Treating Evlyn Kanner Referring Physician: Felix Pacini Physician/Extender: Weeks in Treatment: 13 Information Obtained from: Patient Chief Complaint Patient presents for treatment of an open ulcer due to venous insufficiency. this 29 year old patient has had an ulcer on the lateral part of her right lower extremity and this has been there for 2 weeks. Electronic Signature(s) Signed: 12/21/2014 2:31:41 PM By: Evlyn Kanner MD, FACS Entered By: Evlyn Kanner on 12/21/2014 14:31:40 [name] ([id number]) -------------------------------------------------------------------------------- HPI Details Patient Name: [name], [name]. Date of Service: 12/21/2014 1:45 PM Medical Record Patient Account Number: [id number] Number: [name], RN, [name], Treating RN: [birth year] (28 y.o. [place] Sink Date of Birth/Sex: Female) Other Clinician: Primary Care Physician: KUNEFF, RENEE Treating Evlyn Kanner Referring Physician: Felix Pacini Physician/Extender: Weeks in Treatment: 13 History of Present Illness Location: the lateral part of her right ankle Quality: Patient reports experiencing a dull pain to affected area(s). Severity: Patient states wound are getting worse. Duration: Patient has had the wound for < 2 weeks prior to presenting for treatment Timing: Pain in wound is Intermittent (comes and goes Context: The wound appeared gradually over time Modifying Factors: Consults to this date include:seen in the ER and asked to follow up with wound care. Associated Signs and Symptoms: Patient reports having difficulty standing for  long periods. HPI Description: 29 year old patient who started with having ulcerations on the right lower leg on the lateral part of her ankle for about 2 weeks. She was seen in the ER at [facility] and advised to see the wound care for a consultation. No X-rays of workup was done during the ER visit and no prescription for any medications of compression wraps were given. the patient is not diabetic but does have hypertension and her medications have been reviewed by me. 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 The right and left greater saphenous vein was found to be tortuous. Deep venous system was also not competent and there was reflux of more than 500 ms. She was then seen by Dr. Tawanna Cooler Early who recommended that the patient would not benefit from endovenous ablation and he had recommended vein stripping on the right side and multiple small phlebectomy procedures on the left side. the patient did not follow-up due to social economic reasons. She has not been wearing any compression stockings and has not taken any specific treatment for varicose veins for the last 3 years. 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 We have obtained some appointments to see the vascular surgeons by the end of the month and the patient would like to follow up with me at my [facility] on Wednesday, June 29. 10/14/2014 -- she could not see me yesterday in [place] and hence has come for a review today. She has a vascular workup to be done this afternoon at [facility]. She is doing fine otherwise. 10/22/2014 -- she was seen by Dr. [name] and he has recommended surgical removal of  her right saphenous vein from distal thigh to  saphenofemoral junction and stab phlebectomy's of multiple large tributary branches throughout her thigh and calf. This would be done under general anesthesia in the outpatient setting. 10/29/2014 -- she is trying to work on a surgical date and in the meanwhile we have got insurance [place] [place] [employer] ([id number]) clearance for Apligraf and we will start this next week. 7/22 2016 -- she is here for the first application of Apligraf. 11/19/2014 -- she is here for a second application of Apligraf 11/26/2014 -- she has done fine after her last application of Apligraf and is awaiting her surgery which is scheduled for August 31. 12/03/2014 -- she is doing fine and is here for her third application of Apligraf. 12/21/2014 -- She had surgery on 12/15/2014 by Dr.Early who did #1 ligation and stripping of right great saphenous vein from distal thigh to saphenofemoral junction, #2 stab phlebectomy of large tributary varicose veins in the thigh popliteal space and calf. She had an Ace wrap up to her groin and this was removed today and the Unna's boot was also removed. Electronic Signature(s) Signed: 12/21/2014 2:32:54 PM By: Evlyn Kanner MD, FACS Entered By: Evlyn Kanner on 12/21/2014 14:32:54 [name] ([id number]) -------------------------------------------------------------------------------- Physical Exam Details Patient Name: [name], [name]. Date of Service: 12/21/2014 1:45 PM Medical Record Patient Account Number: [id number] Number: [name], RN, [name], Treating RN: 1985-09-02 (28 y.o. [place] Date of Birth/Sex: Female) Other Clinician: Primary Care Physician: KUNEFF, RENEE Treating [name] Referring Physician: KUNEFF, RENEE Physician/Extender: Weeks in Treatment: 13 Constitutional . Pulse regular. Respirations normal and unlabored. Afebrile. . Eyes Nonicteric. Reactive to light. Ears, Nose, Mouth, and Throat Lips, teeth, and gums WNL.Marland Kitchen Moist mucosa without lesions  . Neck supple and nontender. No palpable supraclavicular or cervical adenopathy. Normal sized without goiter. Respiratory WNL. No retractions.. Cardiovascular Pedal Pulses WNL. No clubbing, cyanosis or edema. Chest Breasts symmetical and no nipple discharge.. Breast tissue WNL, no masses, lumps, or tenderness.. Lymphatic No adneopathy. No adenopathy. No adenopathy. Musculoskeletal Adexa without tenderness or enlargement.. Digits and nails w/o clubbing, cyanosis, infection, petechiae, ischemia, or inflammatory conditions.. Integumentary (Hair, Skin) No suspicious lesions. No crepitus or fluctuance. No peri-wound warmth or erythema. No masses.Marland Kitchen Psychiatric Judgement and insight Intact.. No evidence of depression, anxiety, or agitation.. Notes After taking the patient's dressing down and she has several stab wounds with Steri-Strips along her entire right lower extremity with her recent surgery was done. The ulceration on the right lateral lower extremity and ankle looks clean and there is minimal debris which is washed out with saline. Electronic Signature(s) Signed: 12/21/2014 2:34:22 PM By: Evlyn Kanner MD, FACS Entered By: Evlyn Kanner on 12/21/2014 14:34:21 Sircy, [name] [name] ([id number]) -------------------------------------------------------------------------------- Physician Orders Details Patient Name: [name], [name]. Date of Service: 12/21/2014 1:45 PM Medical Record Patient Account Number: [id number] Number: [name], RN, [name], Treating RN: 12/15/1985 (28 y.o. [place] Sink Date of Birth/Sex: Female) Other Clinician: Primary Care Physician: KUNEFF, RENEE Treating Tylisha Danis Referring Physician: Felix Pacini Physician/Extender: Weeks in Treatment: 71 Verbal / Phone Orders: Yes Clinician: [name], RN, [name], Rita Read Back and Verified: Yes Diagnosis Coding Wound Cleansing Wound #1 Right,Lateral Malleolus o Cleanse wound with mild soap and water Anesthetic Wound #1  Right,Lateral Malleolus o Topical Lidocaine 4% cream applied to wound bed prior to debridement Skin Barriers/Peri-Wound Care Wound #1 Right,Lateral Malleolus o Moisturizing lotion Primary Wound Dressing Wound #1 Right,Lateral Malleolus o Prisma Ag Dressing Change Frequency Wound #1 Right,Lateral Malleolus o Change  dressing every week Follow-up Appointments Wound #1 Right,Lateral Malleolus o Return Appointment in 1 week. Edema Control Wound #1 Right,Lateral Malleolus o Unna Boot to Right Lower Extremity Notes order apligraf for next week Electronic Signature(s) CAROLY, PUREWAL ([id number]) Signed: 12/21/2014 2:51:16 PM By: Elpidio Eric [name], RN Signed: 12/21/2014 4:40:03 PM By: Evlyn Kanner MD, FACS Previous Signature: 12/21/2014 2:33:34 PM Version By: Elpidio Eric [name], RN Entered By: Elpidio Eric on 12/21/2014 14:51:16 Patry, [name] [name] ([id number]) -------------------------------------------------------------------------------- Problem List Details Patient Name: [name], [name]. Date of Service: 12/21/2014 1:45 PM Medical Record Patient Account Number: [id number] Number: [name], RN, [name], Treating RN: [birth year] (28 y.o. 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 [place] Sink Date of Birth/Sex: Female) Other Clinician: Primary Care Physician: KUNEFF, RENEE Treating Evlyn Kanner Referring Physician: Felix Pacini Physician/Extender: Weeks in Treatment: 13 Subjective Chief Complaint Information obtained from Patient Patient presents for treatment of an open ulcer due to venous insufficiency. this 29 year old patient has had an ulcer on the lateral part of her right lower extremity and this has been there for 2 weeks. History of Present Illness (HPI) The following HPI elements were documented for the patient's wound: Location: the lateral part of her right ankle Quality: Patient reports experiencing a dull pain to affected area(s). Severity: Patient states wound are getting worse. Duration: Patient has had the wound for < 2 weeks prior to presenting for treatment Timing: Pain in wound is Intermittent (comes and goes Context: The wound appeared gradually over time Modifying Factors: Consults to this date include:seen in the ER and asked to follow up with wound care. Associated Signs and Symptoms: Patient reports having difficulty standing for long periods. 29 year old patient who started with having ulcerations on the right lower leg on the lateral part of her ankle for about 2 weeks. She was seen in the ER at [facility] and advised to see the wound care for a consultation. No X-rays of workup was done during the ER visit and no prescription for any medications of compression wraps were given. the patient is not diabetic but does have hypertension and her medications have been reviewed by me. 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 The right and left greater saphenous vein was found to be tortuous. Deep venous system was also not competent and there was reflux of more than 500 ms. She was then seen by Dr. Tawanna Cooler Early who recommended that the patient would not benefit from endovenous ablation and he had recommended vein stripping on the right side and multiple small phlebectomy procedures on the left side. the patient did not follow-up due to social economic reasons.  She has not been wearing any compression stockings and has not taken any specific treatment for varicose veins for the last 3 years. 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 We have obtained some appointments to see the vascular surgeons by the end of the month and the patient would like to follow up with me at my [facility] on Wednesday, June 29. [name], [name] ([id number]) 10/14/2014 -- she could not see me yesterday in [place] and hence has come for a review today. She has a vascular workup to be done this afternoon at [facility]. She is doing fine otherwise. 10/22/2014 -- she was seen by Dr. [name] and he has recommended surgical removal of her right saphenous vein from distal thigh to saphenofemoral junction and stab phlebectomy's of multiple large tributary branches throughout her thigh and calf. This would be done under general anesthesia in the outpatient setting. 10/29/2014 -- she is trying to work on a surgical date and in the meanwhile we have got insurance clearance for Apligraf and we will start this next week. 7/22 2016 -- she is here for the first application of Apligraf. 11/19/2014 -- she is here for a second application of Apligraf 11/26/2014 -- she has done fine after her  last application of Apligraf and is awaiting her surgery which is scheduled for August 31. 12/03/2014 -- she is doing fine and is here for her third application of Apligraf. 12/21/2014 -- She had surgery on 12/15/2014 by Dr.Early who did #1 ligation and stripping of right great saphenous vein from distal thigh to saphenofemoral junction, #2 stab phlebectomy of large tributary varicose veins in the thigh popliteal space and calf. She had an Ace wrap up to her groin and this was removed today and the Unna's boot was also removed. Objective Constitutional Pulse regular. Respirations normal and unlabored. Afebrile. Vitals Time Taken: 1:52 PM, Height: 66 in, Weight: 350.3 lbs, BMI: 56.5, Temperature: 98.1 F, Pulse: 88 bpm, Respiratory Rate: 19 breaths/min, Blood Pressure: 120/70 mmHg. Eyes Nonicteric. Reactive to light. Ears, Nose, Mouth, and Throat Lips, teeth, and gums WNL.Marland Kitchen Moist mucosa without lesions . Neck supple and nontender. No palpable supraclavicular or cervical adenopathy. Normal sized without goiter. Respiratory WNL. No retractions.. Cardiovascular Pedal Pulses WNL. No clubbing, cyanosis or edema. [name], [name] ([id number]) Chest Breasts symmetical and no nipple discharge.. Breast tissue WNL, no masses, lumps, or tenderness.. Lymphatic No adneopathy. No adenopathy. No adenopathy. Musculoskeletal Adexa without tenderness or enlargement.. Digits and nails w/o clubbing, cyanosis, infection, petechiae, ischemia, or inflammatory conditions.Marland Kitchen Psychiatric Judgement and insight Intact.. No evidence of depression, anxiety, or agitation.. General Notes: After taking the patient's dressing down and she has several stab wounds with Steri-Strips along her entire right lower extremity with her recent surgery was done. The ulceration on the right lateral lower extremity and ankle looks clean and there is minimal debris which is washed out with saline. Integumentary (Hair, Skin) No  suspicious lesions. No crepitus or fluctuance. No peri-wound warmth or erythema. No masses.. Wound #1 status is Open. Original cause of wound was Not Known. The wound is located on the Right,Lateral Malleolus. The wound measures 1.1cm length x 1.4cm width x 0.1cm depth; 1.21cm^2 area and 0.121cm^3 volume. The wound is limited to skin breakdown. There is no tunneling or undermining noted. There is a medium amount of serosanguineous drainage noted. The wound margin is distinct with the outline attached  to the wound base. There is large (67-100%) granulation within the wound bed. There is a small (1-33%) amount of necrotic tissue within the wound bed including Adherent Slough. The periwound skin appearance exhibited: Localized Edema, Moist. The periwound skin appearance did not exhibit: Callus, Crepitus, Excoriation, Fluctuance, Friable, Induration, Rash, Scarring, Dry/Scaly, Maceration, Atrophie Blanche, Cyanosis, Ecchymosis, Hemosiderin Staining, Mottled, Pallor, Rubor, Erythema. Periwound temperature was noted as No Abnormality. The periwound has tenderness on palpation. 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 ([id number]) I have recommended collagen over her wounds and application of an Unna's boot. There is some confusion regarding the rest of her leg to be wrapped up to the groin and I was asked to call her vascular surgeon to clarify this. She will have her fourth application of Apligraf next week. Plan Wound Cleansing: Wound #1 Right,Lateral Malleolus: Cleanse wound with mild soap and water Anesthetic: Wound #1 Right,Lateral Malleolus: Topical Lidocaine 4% cream applied to wound bed prior to debridement Skin Barriers/Peri-Wound Care: Wound #1 Right,Lateral  Malleolus: Moisturizing lotion Primary Wound Dressing: Wound #1 Right,Lateral Malleolus: Prisma Ag Dressing Change Frequency: Wound #1 Right,Lateral Malleolus: Change dressing every week Follow-up Appointments: Wound #1 Right,Lateral Malleolus: Return Appointment in 1 week. Edema Control: Wound #1 Right,Lateral Malleolus: Unna Boot to Right Lower Extremity General Notes: order apligraf for next week I have recommended collagen over her wounds and application of an Unna's boot. There is some confusion regarding the rest of her leg to be wrapped up to the groin and I was asked to call her vascular surgeon to clarify this. She will have her fourth application of Apligraf next week. Electronic Signature(s) Signed: 12/21/2014 4:39:02 PM By: Evlyn Kanner MD, FACS Previous Signature: 12/21/2014 2:35:40 PM Version By: Evlyn Kanner MD, FACS Entered By: Evlyn Kanner on 12/21/2014 16:39:02 Molder, [name] [name] ([id number]) -------------------------------------------------------------------------------- SuperBill Details Patient Name: [name], [name]. Date of Service: 12/21/2014 Medical Record Patient Account Number: [id number] Number: [name], RN, [name], Treating RN: [birth year] (28 y.o. 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 E66.01 Morbid (severe) obesity due to excess calories Facility Procedures CPT4 Code: [id number] Description: (Facility Use Only) 30865HQ - APPLY Roland Rack BOOT RT Modifier: Quantity: 1 Physician Procedures CPT4 Code Description: [id number] - WC PHYS LEVEL 3 - EST PT ICD-10 Description Diagnosis I83.013 Varicose veins of right lower  extremity with ulcer L97.312 Non-pressure chronic ulcer of right ankle with fat E66.01 Morbid (severe) obesity due to  excess calories Modifier: of ankle layer exposed Quantity: 1 Electronic Signature(s) Signed: 12/21/2014 2:51:39 PM By: Elpidio Eric [name], RN Signed: 12/21/2014 4:40:03 PM By: Evlyn Kanner MD, FACS Previous Signature: 12/21/2014 2:36:02 PM Version By: Evlyn Kanner MD, FACS Entered By: Elpidio Eric on 12/21/2014 14:51:39

## 2014-12-21 NOTE — Progress Notes (Signed)
[name], [name] ([id number]) Visit Report for 12/21/2014 Arrival Information Details Patient Name: [name], [name]. Date of Service: 12/21/2014 1:45 PM Medical Record Number: [id number] Patient Account Number: [id number] Date of Birth/Sex: 03-18-1986 (28 y.o. Female) Treating RN: [name], RN, [name], [place] Sink Primary Care Physician: Felix Pacini Other Clinician: Referring Physician: Felix Pacini Treating Physician/Extender: Rudene Re in Treatment: 13 Visit Information History Since Last Visit Added or deleted any medications: No Patient Arrived: Ambulatory Any new allergies or adverse reactions: No Arrival Time: 13:52 Had a fall or experienced change in No Accompanied By: self activities of daily living that may affect Transfer Assistance: None risk of falls: Patient Identification Verified: Yes Signs or symptoms of abuse/neglect since last No Secondary Verification Process Yes visito Completed: Has Dressing in Place as Prescribed: Yes Patient Has Alerts: Yes Has Compression in Place as Prescribed: Yes Patient Alerts: 09/2013 ABI R: Pain Present Now: No 1.3 Electronic Signature(s) Signed: 12/21/2014 1:52:25 PM By: Elpidio Eric [name], RN Entered By: Elpidio Eric on 12/21/2014 13:52:24 Igarashi, [name] -------------------------------------------------------------------------------- Encounter Discharge Information Details Patient Name: [name], [name]. Date of Service: 12/21/2014 1:45 PM Medical Record Number: [id number] Patient Account Number: [id number] Date of Birth/Sex: 07-Jan-1986 (28 y.o. Female) Treating RN: [name], RN, [name], [place] Sink Primary Care Physician: Felix Pacini Other Clinician: Referring Physician: Felix Pacini Treating Physician/Extender: Rudene Re in Treatment: 59 Encounter Discharge Information Items Discharge Pain Level: 0 Discharge Condition: Stable Ambulatory Status: Ambulatory Discharge Destination:  Home Private Transportation: Auto Accompanied By: self Schedule Follow-up Appointment: No Medication Reconciliation completed and No provided to Patient/Care Nicholus Chandran: Clinical Summary of Care: Electronic Signature(s) Signed: 12/21/2014 2:52:58 PM By: Elpidio Eric [name], RN Entered By: Elpidio Eric on 12/21/2014 14:52:58 Violette, [name] ([id number]) -------------------------------------------------------------------------------- Lower Extremity Assessment Details Patient Name: [name], [name] Date of Service: 12/21/2014 1:45 PM Medical Record Number: [id number] Patient Account Number: [id number] Date of Birth/Sex: 1986/01/03 (28 y.o. Female) Treating RN: [name], RN, [name],  Sink Primary Care Physician: Felix Pacini Other Clinician: Referring Physician: KUNEFF, RENEE Treating Physician/Extender: Rudene Re in Treatment: 13 Edema Assessment Assessed: [Left: No] [Right: No] E[Left: dema] [Right: :] Calf Left: Right: Point of Measurement: cm From Medial Instep cm 55 cm Ankle Left: Right: Point of Measurement: cm From Medial Instep cm 31.5 cm Vascular Assessment Pulses: Posterior Tibial Dorsalis Pedis Palpable: [Right:Yes] Extremity colors, hair growth, and conditions: Extremity Color: [Right:Dusky] Temperature of Extremity: [Right:Warm] Capillary Refill: [Right:< 3 seconds] Toe Nail Assessment Left: Right: Thick: No Discolored: No Deformed: No Improper Length and Hygiene: No Electronic Signature(s) Signed: 12/21/2014 1:53:25 PM By: Elpidio Eric [name], RN Entered By: Elpidio Eric on 12/21/2014 13:53:25 Quinonez, [name] ([id number]) -------------------------------------------------------------------------------- Multi Wound Chart Details Patient Name: [name]. Date of Service: 12/21/2014 1:45 PM Medical Record Number: [id number] Patient Account Number: [id number] Date of Birth/Sex: [birth year] (28 y.o. Female) Treating RN: [name], RN, [name],  Sink Primary Care  Physician: Felix Pacini Other Clinician: Referring Physician: KUNEFF, RENEE Treating Physician/Extender: Rudene Re in Treatment: 13 Vital Signs Height(in): 66 Pulse(bpm): 88 Weight(lbs): 350.3 Blood Pressure 120/70 (mmHg): Body Mass Index(BMI): 57 Temperature(F): 98.1 Respiratory Rate 19 (breaths/min): Photos: [1:No Photos] [N/A:N/A] Wound Location: [1:Right Malleolus - Lateral] [N/A:N/A] Wounding Event: [1:Not Known] [N/A:N/A] Primary Etiology: [1:Venous Leg Ulcer] [N/A:N/A] Date Acquired: [1:09/06/2014] [N/A:N/A] Weeks of Treatment: [1:13] [N/A:N/A] Wound Status: [1:Open] [N/A:N/A] Measurements L x W x D 1.1x1.4x0.1 [N/A:N/A] (cm) Area (cm) : [1:1.21] [N/A:N/A] Volume (cm) : [1:0.121] [N/A:N/A] % Reduction in Area: [1:45.00%] [N/A:N/A] % Reduction in Volume: 81.70% [  N/A:N/A] Classification: [1:Full Thickness Without Exposed Support Structures] [N/A:N/A] Exudate Amount: [1:Medium] [N/A:N/A] Exudate Type: [1:Serosanguineous] [N/A:N/A] Exudate Color: [1:red, brown] [N/A:N/A] Wound Margin: [1:Distinct, outline attached] [N/A:N/A] Granulation Amount: [1:Large (67-100%)] [N/A:N/A] Necrotic Amount: [1:Small (1-33%)] [N/A:N/A] Exposed Structures: [1:Fascia: No Fat: No Tendon: No Muscle: No Joint: No Bone: No Limited to Skin Breakdown] [N/A:N/A] Epithelialization: Medium (34-66%) N/A N/A Periwound Skin Texture: Edema: Yes N/A N/A Excoriation: No Induration: No Callus: No Crepitus: No Fluctuance: No Friable: No Rash: No Scarring: No Periwound Skin Moist: Yes N/A N/A Moisture: Maceration: No Dry/Scaly: No Periwound Skin Color: Atrophie Blanche: No N/A N/A Cyanosis: No Ecchymosis: No Erythema: No Hemosiderin Staining: No Mottled: No Pallor: No Rubor: No Temperature: No Abnormality N/A N/A Tenderness on Yes N/A N/A Palpation: Wound Preparation: Ulcer Cleansing: Other: N/A N/A soap and water Topical Anesthetic Applied: Other:  lidocaine 4% Treatment Notes Electronic Signature(s) Signed: 12/21/2014 2:30:12 PM By: Elpidio Eric [name], RN Entered By: Elpidio Eric on 12/21/2014 14:30:11 Corle, [name] ([id number]) -------------------------------------------------------------------------------- Multi-Disciplinary Care Plan Details Patient Name: ANOUSHKA, DIVITO. Date of Service: 12/21/2014 1:45 PM Medical Record Number: [id number] Patient Account Number: [id number] Date of Birth/Sex: 08-12-1985 (28 y.o. Female) Treating RN: [name], RN, [name], [place] Sink Primary Care Physician: Felix Pacini Other Clinician: Referring Physician: Felix Pacini Treating Physician/Extender: Rudene Re in Treatment: 76 Active Inactive Abuse / Safety / Falls / Self Care Management Nursing Diagnoses: Potential for falls Goals: Patient will remain injury free Date Initiated: 09/20/2014 Goal Status: Active Interventions: Assess fall risk on admission and as needed Notes: Orientation to the Wound Care Program Nursing Diagnoses: Knowledge deficit related to the wound healing center program Goals: Patient/caregiver will verbalize understanding of the Wound Healing Center Program Date Initiated: 09/20/2014 Goal Status: Active Interventions: Provide education on orientation to the wound center Notes: Pain, Acute or Chronic Nursing Diagnoses: Pain Management - Cyclic Acute (Dressing Change Related) Goals: Patient will verbalize adequate pain control and receive pain control interventions during procedures as needed MATTIA, OSTERMAN ([id number]) Date Initiated: 09/20/2014 Goal Status: Active Interventions: Complete pain assessment as per visit requirements Notes: Venous Leg Ulcer Nursing Diagnoses: Potential for venous Insuffiency (use before diagnosis confirmed) Goals: Non-invasive venous studies are completed as ordered Date Initiated: 09/20/2014 Goal Status: Active Interventions: Assess peripheral edema status every  visit. Notes: Wound/Skin Impairment Nursing Diagnoses: Impaired tissue integrity Goals: Ulcer/skin breakdown will have a volume reduction of 30% by week 4 Date Initiated: 09/20/2014 Goal Status: Active Interventions: Assess ulceration(s) every visit Notes: Electronic Signature(s) Signed: 12/21/2014 2:29:20 PM By: Elpidio Eric [name], RN Entered By: Elpidio Eric on 12/21/2014 14:29:16 Bertling, [name] ([id number]) -------------------------------------------------------------------------------- Pain Assessment Details Patient Name: DEMAYA, HARDGE. Date of Service: 12/21/2014 1:45 PM Medical Record Number: [id number] Patient Account Number: [id number] Date of Birth/Sex: 1985-09-22 (28 y.o. Female) Treating RN: [name], RN, [name], [employer] Sink Primary Care Physician: Felix Pacini Other Clinician: Referring Physician: Felix Pacini Treating Physician/Extender: Rudene Re in Treatment: 13 Active Problems Location of Pain Severity and Description of Pain Patient Has Paino No Site Locations Pain Management and Medication Current Pain Management: Electronic Signature(s) Signed: 12/21/2014 1:52:32 PM By: Elpidio Eric [name], RN Entered By: Elpidio Eric on 12/21/2014 13:52:31 Rabine, [name] ([id number]) -------------------------------------------------------------------------------- Wound Assessment Details Patient Name: HINATA, DIENER. Date of Service: 12/21/2014 1:45 PM Medical Record Number: [id number] Patient Account Number: [id number] Date of Birth/Sex: 23-Oct-1985 (28 y.o. Female) Treating RN: [name], RN, [name], [employer] Primary Care Physician: Felix Pacini Other Clinician: Referring Physician: Felix Pacini Treating Physician/Extender: Rudene Re  in Treatment: 13 Wound Status Wound Number: 1 Primary Etiology: Venous Leg Ulcer Wound Location: Right Malleolus - Lateral Wound Status: Open Wounding Event: Not Known Date Acquired: 09/06/2014 Weeks Of Treatment: 13 Clustered Wound:  No Photos Photo Uploaded By: Elpidio Eric on 12/21/2014 17:17:25 Wound Measurements Length: (cm) 1.1 Width: (cm) 1.4 Depth: (cm) 0.1 Area: (cm) 1.21 Volume: (cm) 0.121 % Reduction in Area: 45% % Reduction in Volume: 81.7% Epithelialization: Medium (34-66%) Tunneling: No Undermining: No Wound Description Full Thickness Without Exposed Classification: Support Structures Wound Margin: Distinct, outline attached Exudate Medium Amount: Exudate Type: Serosanguineous Exudate Color: red, brown Foul Odor After Cleansing: No Wound Bed Granulation Amount: Large (67-100%) Exposed Structure Necrotic Amount: Small (1-33%) Fascia Exposed: No Necrotic Quality: Adherent Slough Fat Layer Exposed: No ALEDA, MADL. ([id number]) Tendon Exposed: No Muscle Exposed: No Joint Exposed: No Bone Exposed: No Limited to Skin Breakdown Periwound Skin Texture Texture Color No Abnormalities Noted: No No Abnormalities Noted: No Callus: No Atrophie Blanche: No Crepitus: No Cyanosis: No Excoriation: No Ecchymosis: No Fluctuance: No Erythema: No Friable: No Hemosiderin Staining: No Induration: No Mottled: No Localized Edema: Yes Pallor: No Rash: No Rubor: No Scarring: No Temperature / Pain Moisture Temperature: No Abnormality No Abnormalities Noted: No Tenderness on Palpation: Yes Dry / Scaly: No Maceration: No Moist: Yes Wound Preparation Ulcer Cleansing: Other: soap and water, Topical Anesthetic Applied: Other: lidocaine 4%, Treatment Notes Wound #1 (Right, Lateral Malleolus) 1. Cleansed with: Cleanse wound with antibacterial soap and water 3. Peri-wound Care: Moisturizing lotion 4. Dressing Applied: Prisma Ag 5. Secondary Dressing Applied Dry Gauze 7. Secured with Henriette Combs to Right Lower Extremity Notes Drawtex over wound Electronic Signature(s) Signed: 12/21/2014 2:14:39 PM By: Elpidio Eric [name], RN Previous Signature: 12/21/2014 2:07:41 PM Version By: Elpidio Eric  [name], RN [address] [name] Entered By: Elpidio Eric on 12/21/2014 14:14:38 Hoadley, [name] -------------------------------------------------------------------------------- Vitals Details Patient Name: ANNALYCE, LANPHER. Date of Service: 12/21/2014 1:45 PM Medical Record Number: [id number] Patient Account Number: [id number] Date of Birth/Sex: May 19, 1985 (28 y.o. Female) Treating RN: [name], RN, [name], [place] Primary Care Physician: Felix Pacini Other Clinician: Referring Physician: KUNEFF, RENEE Treating Physician/Extender: Rudene Re in Treatment: 13 Vital Signs Time Taken: 13:52 Temperature (F): 98.1 Height (in): 66 Pulse (bpm): 88 Weight (lbs): 350.3 Respiratory Rate (breaths/min): 19 Body Mass Index (BMI): 56.5 Blood Pressure (mmHg): 120/70 Reference Range: 80 - 120 mg / dl Electronic Signature(s) Signed: 12/21/2014 1:54:07 PM By: Elpidio Eric [name], RN Previous Signature: 12/21/2014 1:52:52 PM Version By: Elpidio Eric [name], RN Entered By: Elpidio Eric on 12/21/2014 13:54:06

## 2014-12-21 NOTE — Telephone Encounter (Signed)
Pt's mother called to inquire about continued dressing care to right LE.  Voiced concern about keeping the Ace bandage in place, and in applying it correctly.  Also, questioned about how to continue to take care of the incisions of right LE, and the areas that have "little pieces of tape."   Advised to cleanse the incisional areas with Dial soap and water daily, and to leave the steri-strips alone, allowing them to fall off on their own.  Advised that, per Dr. Arbie Cookey, the pt. can stop wearing the Ace bandage at this time, since the right GSV has been removed.  Questions answered.  Pt's mother verb. understanding.

## 2014-12-28 ENCOUNTER — Encounter: Payer: BLUE CROSS/BLUE SHIELD | Admitting: Surgery

## 2014-12-28 DIAGNOSIS — L97312 Non-pressure chronic ulcer of right ankle with fat layer exposed: Secondary | ICD-10-CM | POA: Diagnosis not present

## 2014-12-28 NOTE — Progress Notes (Addendum)
[name], QUIMBY ([id number]) Visit Report for 12/28/2014 Arrival Information Details Patient Name: [name], [name]. Date of Service: 12/28/2014 1:00 PM Medical Record Number: [id number] Patient Account Number: [id number] Date of Birth/Sex: 04/02/1986 (28 y.o. Female) Treating RN: [name], RN, [name], [place] Sink Primary Care Physician: Felix Pacini Other Clinician: Referring Physician: Felix Pacini Treating Physician/Extender: Rudene Re in Treatment: 14 Visit Information History Since Last Visit Any new allergies or adverse reactions: No Patient Arrived: Ambulatory Had a fall or experienced change in No Arrival Time: 13:16 activities of daily living that may affect Accompanied By: self risk of falls: Transfer Assistance: None Signs or symptoms of abuse/neglect since last No Patient Identification Verified: Yes visito Secondary Verification Process Yes Hospitalized since last visit: No Completed: Has Dressing in Place as Prescribed: Yes Patient Has Alerts: Yes Has Compression in Place as Prescribed: Yes Patient Alerts: 09/2013 ABI R: Pain Present Now: No 1.3 Electronic Signature(s) Signed: 12/28/2014 1:16:42 PM By: Elpidio Eric [name], RN Entered By: Elpidio Eric on 12/28/2014 13:16:41 Levinson, [name] -------------------------------------------------------------------------------- Encounter Discharge Information Details Patient Name: [name]. Date of Service: 12/28/2014 1:00 PM Medical Record Number: [id number] Patient Account Number: [id number] Date of Birth/Sex: [birth year] (28 y.o. Female) Treating RN: [name], RN, [name], [place]ink Primary Care Physician: Felix Pacini Other Clinician: Referring Physician: Felix Pacini Treating Physician/Extender: Rudene Re in Treatment: 14 Encounter Discharge Information Items Discharge Pain Level: 0 Discharge Condition: Stable Ambulatory Status: Ambulatory Discharge Destination: Home Transportation: Private  Auto Accompanied By: self Schedule Follow-up Appointment: No Medication Reconciliation completed and provided to Patient/Care No Koleen Celia: Provided on Clinical Summary of Care: 12/28/2014 Form Type Recipient Paper Patient SS Electronic Signature(s) Signed: 12/28/2014 1:50:38 PM By: Elpidio Eric [name], RN Previous Signature: 12/28/2014 1:47:35 PM Version By: Gwenlyn Perking Entered By: Elpidio Eric on 12/28/2014 13:50:37 Javier, [name] ([id number]) -------------------------------------------------------------------------------- Lower Extremity Assessment Details Patient Name: [name]. Date of Service: 12/28/2014 1:00 PM Medical Record Number: [id number] Patient Account Number: [id number] Date of Birth/Sex: [birth year] (28 y.o. Female) Treating RN: [name], RN, [name], [place] Sink Primary Care Physician: Felix Pacini Other Clinician: Referring Physician: KUNEFF, RENEE Treating Physician/Extender: Rudene Re in Treatment: 14 Edema Assessment Assessed: [Left: No] [Right: No] E[Left: dema] [Right: :] Calf Left: Right: Point of Measurement: cm From Medial Instep cm 55 cm Ankle Left: Right: Point of Measurement: cm From Medial Instep cm 31.5 cm Vascular Assessment Claudication: Claudication Assessment [Right:None] Pulses: Posterior Tibial Dorsalis Pedis Palpable: [Right:Yes] Extremity colors, hair growth, and conditions: Extremity Color: [Right:Dusky] Hair Growth on Extremity: [Right:No] Temperature of Extremity: [Right:Warm] Capillary Refill: [Right:< 3 seconds] Toe Nail Assessment Left: Right: Thick: No Discolored: No Deformed: No Improper Length and Hygiene: No Electronic Signature(s) Signed: 12/28/2014 1:36:52 PM By: Elpidio Eric [name], RN Entered By: Elpidio Eric on 12/28/2014 13:36:52 Hinds, [name] ([id number]) Diggins, [name] ([id number]) -------------------------------------------------------------------------------- Multi Wound Chart Details Patient Name:  [name]. Date of Service: 12/28/2014 1:00 PM Medical Record Number: [id number] Patient Account Number: [id number] Date of Birth/Sex: September 14, 1985 (28 y.o. Female) Treating RN: [name], RN, [name], [place] Sink Primary Care Physician: Felix Pacini Other Clinician: Referring Physician: KUNEFF, RENEE Treating Physician/Extender: Rudene Re in Treatment: 14 Vital Signs Height(in): 66 Pulse(bpm): 88 Weight(lbs): 350.3 Blood Pressure 152/74 (mmHg): Body Mass Index(BMI): 57 Temperature(F): 98.5 Respiratory Rate 18 (breaths/min): Photos: [1:No Photos] [N/A:N/A] Wound Location: [1:Right Malleolus - Lateral] [N/A:N/A] Wounding Event: [1:Not Known] [N/A:N/A] Primary Etiology: [1:Venous Leg Ulcer] [N/A:N/A] Date Acquired: [1:09/06/2014] [N/A:N/A] Weeks of Treatment: [1:14] [N/A:N/A] Wound Status: [1:Open] [N/A:N/A]  Measurements L x W x D 0.5x1.2x0.1 [N/A:N/A] (cm) Area (cm) : [1:0.471] [N/A:N/A] Volume (cm) : [1:0.047] [N/A:N/A] % Reduction in Area: [1:78.60%] [N/A:N/A] % Reduction in Volume: 92.90% [N/A:N/A] Classification: [1:Full Thickness Without Exposed Support Structures] [N/A:N/A] Exudate Amount: [1:Medium] [N/A:N/A] Exudate Type: [1:Serosanguineous] [N/A:N/A] Exudate Color: [1:red, brown] [N/A:N/A] Wound Margin: [1:Distinct, outline attached] [N/A:N/A] Granulation Amount: [1:Large (67-100%)] [N/A:N/A] Necrotic Amount: [1:Small (1-33%)] [N/A:N/A] Exposed Structures: [1:Fascia: No Fat: No Tendon: No Muscle: No Joint: No Bone: No Limited to Skin Breakdown] [N/A:N/A] Epithelialization: Medium (34-66%) N/A N/A Periwound Skin Texture: Edema: Yes N/A N/A Excoriation: No Induration: No Callus: No Crepitus: No Fluctuance: No Friable: No Rash: No Scarring: No Periwound Skin Moist: Yes N/A N/A Moisture: Maceration: No Dry/Scaly: No Periwound Skin Color: Atrophie Blanche: No N/A N/A Cyanosis: No Ecchymosis: No Erythema: No Hemosiderin Staining: No Mottled:  No Pallor: No Rubor: No Temperature: No Abnormality N/A N/A Tenderness on Yes N/A N/A Palpation: Wound Preparation: Ulcer Cleansing: Other: N/A N/A soap and water Topical Anesthetic Applied: Other: lidocaine 4% Treatment Notes Electronic Signature(s) Signed: 12/28/2014 1:39:35 PM By: Elpidio Eric [name], RN Entered By: Elpidio Eric on 12/28/2014 13:39:34 Stefanko, [name] ([id number]) -------------------------------------------------------------------------------- Multi-Disciplinary Care Plan Details Patient Name: SONTEE, DESENA. Date of Service: 12/28/2014 1:00 PM Medical Record Number: [id number] Patient Account Number: [id number] Date of Birth/Sex: 01/06/1986 (28 y.o. Female) Treating RN: [name], RN, [name], [place] Sink Primary Care Physician: Felix Pacini Other Clinician: Referring Physician: Felix Pacini Treating Physician/Extender: Rudene Re in Treatment: 14 Active Inactive Abuse / Safety / Falls / Self Care Management Nursing Diagnoses: Potential for falls Goals: Patient will remain injury free Date Initiated: 09/20/2014 Goal Status: Active Interventions: Assess fall risk on admission and as needed Notes: Orientation to the Wound Care Program Nursing Diagnoses: Knowledge deficit related to the wound healing center program Goals: Patient/caregiver will verbalize understanding of the Wound Healing Center Program Date Initiated: 09/20/2014 Goal Status: Active Interventions: Provide education on orientation to the wound center Notes: Pain, Acute or Chronic Nursing Diagnoses: Pain Management - Cyclic Acute (Dressing Change Related) Goals: Patient will verbalize adequate pain control and receive pain control interventions during procedures as needed AJANAE, VIRAG ([id number]) Date Initiated: 09/20/2014 Goal Status: Active Interventions: Complete pain assessment as per visit requirements Notes: Venous Leg Ulcer Nursing Diagnoses: Potential for venous Insuffiency  (use before diagnosis confirmed) Goals: Non-invasive venous studies are completed as ordered Date Initiated: 09/20/2014 Goal Status: Active Interventions: Assess peripheral edema status every visit. Notes: Wound/Skin Impairment Nursing Diagnoses: Impaired tissue integrity Goals: Ulcer/skin breakdown will have a volume reduction of 30% by week 4 Date Initiated: 09/20/2014 Goal Status: Active Interventions: Assess ulceration(s) every visit Notes: Electronic Signature(s) Signed: 12/28/2014 1:39:26 PM By: Elpidio Eric [name], RN Entered By: Elpidio Eric on 12/28/2014 13:39:26 Lai, [name] ([id number]) -------------------------------------------------------------------------------- Pain Assessment Details Patient Name: JAILANI, HOGANS. Date of Service: 12/28/2014 1:00 PM Medical Record Number: [id number] Patient Account Number: [id number] Date of Birth/Sex: Apr 28, 1985 (28 y.o. Female) Treating RN: [name], RN, [name], [place] Sink Primary Care Physician: Felix Pacini Other Clinician: Referring Physician: Felix Pacini Treating Physician/Extender: Rudene Re in Treatment: 14 Active Problems Location of Pain Severity and Description of Pain Patient Has Paino No Site Locations Pain Management and Medication Current Pain Management: Electronic Signature(s) Signed: 12/28/2014 1:16:56 PM By: Elpidio Eric [name], RN Entered By: Elpidio Eric on 12/28/2014 13:16:56 Fendley, [name] ([id number]) -------------------------------------------------------------------------------- Patient/Caregiver Education Details Patient Name: DAVIONA, HERBERT Date of Service: 12/28/2014 1:00 PM Medical Record Number: [id number] Patient Account Number:  [id number] Date of Birth/Gender: 1985-06-25 (28 y.o. Female) Treating RN: [name], RN, [name], [place] Sink Primary Care Physician: Felix Pacini Other Clinician: Referring Physician: Felix Pacini Treating Physician/Extender: Rudene Re in Treatment: 14 Education  Assessment Education Provided To: Patient Education Topics Provided Welcome To The Wound Care Center: Methods: Explain/Verbal Responses: State content correctly Electronic Signature(s) Signed: 12/28/2014 1:50:48 PM By: Elpidio Eric [name], RN Entered By: Elpidio Eric on 12/28/2014 13:50:48 Segel, [name] -------------------------------------------------------------------------------- Wound Assessment Details Patient Name: JAMYIAH, LABELLA. Date of Service: 12/28/2014 1:00 PM Medical Record Number: [id number] Patient Account Number: [id number] Date of Birth/Sex: 01-Nov-1985 (28 y.o. Female) Treating RN: [name], RN, [name], Psychologist, clinical Primary Care Physician: Felix Pacini Other Clinician: Referring Physician: KUNEFF, RENEE Treating Physician/Extender: Rudene Re in Treatment: 14 Wound Status Wound Number: 1 Primary Etiology: Venous Leg Ulcer Wound Location: Right Malleolus - Lateral Wound Status: Open Wounding Event: Not Known Date Acquired: 09/06/2014 Weeks Of Treatment: 14 Clustered Wound: No Photos Wound Measurements Length: (cm) 0.5 Width: (cm) 1.2 Depth: (cm) 0.1 Area: (cm) 0.471 Volume: (cm) 0.047 % Reduction in Area: 78.6% % Reduction in Volume: 92.9% Epithelialization: Medium (34-66%) Tunneling: No Undermining: No Wound Description Full Thickness Without Exposed Classification: Support Structures Wound Margin: Distinct, outline attached Exudate Medium Amount: Exudate Type: Serosanguineous Exudate Color: red, brown Foul Odor After Cleansing: No Wound Bed Granulation Amount: Large (67-100%) Exposed Structure Necrotic Amount: Small (1-33%) Fascia Exposed: No Necrotic Quality: Adherent Slough Fat Layer Exposed: No ANTONISHA, WASKEY. ([id number]) Tendon Exposed: No Muscle Exposed: No Joint Exposed: No Bone Exposed: No Limited to Skin Breakdown Periwound Skin Texture Texture Color No Abnormalities Noted: No No Abnormalities Noted: No Callus:  No Atrophie Blanche: No Crepitus: No Cyanosis: No Excoriation: No Ecchymosis: No Fluctuance: No Erythema: No Friable: No Hemosiderin Staining: No Induration: No Mottled: No Localized Edema: Yes Pallor: No Rash: No Rubor: No Scarring: No Temperature / Pain Moisture Temperature: No Abnormality No Abnormalities Noted: No Tenderness on Palpation: Yes Dry / Scaly: No Maceration: No Moist: Yes Wound Preparation Ulcer Cleansing: Other: soap and water, Topical Anesthetic Applied: Other: lidocaine 4%, Treatment Notes Wound #1 (Right, Lateral Malleolus) 3. Peri-wound Care: Moisturizing lotion 5. Secondary Dressing Applied ABD Pad Dry Gauze 7. Secured with Henriette Combs to Right Lower Extremity Notes Drawtex over wound Electronic Signature(s) Signed: 12/29/2014 2:36:17 PM By: Elpidio Eric [name], RN Previous Signature: 12/28/2014 1:26:30 PM Version By: Elpidio Eric [name], RN Entered By: Elpidio Eric on 12/29/2014 14:36:17 Ausborn, [name] ([id number]) -------------------------------------------------------------------------------- Vitals Details Patient Name: EMELY, FAHY Date of Service: 12/28/2014 1:00 PM Medical Record Number: [id number] Patient Account Number: [id number] Date of Birth/Sex: 13-Sep-1985 (28 y.o. Female) Treating RN: [name], RN, [name], [place] Primary Care Physician: Felix Pacini Other Clinician: Referring Physician: KUNEFF, RENEE Treating Physician/Extender: Rudene Re in Treatment: 14 Vital Signs Time Taken: 13:16 Temperature (F): 98.5 Height (in): 66 Pulse (bpm): 88 Weight (lbs): 350.3 Respiratory Rate (breaths/min): 18 Body Mass Index (BMI): 56.5 Blood Pressure (mmHg): 152/74 Reference Range: 80 - 120 mg / dl Electronic Signature(s) Signed: 12/28/2014 1:17:24 PM By: Elpidio Eric [name], RN Entered By: Elpidio Eric on 12/28/2014 13:17:24

## 2014-12-28 NOTE — Progress Notes (Addendum)
RYNLEE, LISBON ([id number]) Visit Report for 12/28/2014 Chief Complaint Document Details Patient Name: [name], [name]. Date of Service: 12/28/2014 1:00 PM Medical Record Patient Account Number: [id number] Number: [name], RN, [name], Treating RN: 04/25/1985 (29 y.o. [place] Sink Date of Birth/Sex: Female) Other Clinician: Primary Care Physician: KUNEFF, RENEE Treating Evlyn Kanner Referring Physician: Felix Pacini Physician/Extender: Weeks in Treatment: 14 Information Obtained from: Patient Chief Complaint Patient presents for treatment of an open ulcer due to venous insufficiency. this 29 year old patient has had an ulcer on the lateral part of her right lower extremity and this has been there for 2 weeks. Electronic Signature(s) Signed: 12/28/2014 1:58:05 PM By: Evlyn Kanner MD, FACS Entered By: Evlyn Kanner on 12/28/2014 13:58:05 Gurr, [name] -------------------------------------------------------------------------------- Cellular or Tissue Based Product Details Patient Name: [name], [name]. Date of Service: 12/28/2014 1:00 PM Medical Record Patient Account Number: [id number] Number: [name], RN, [name], Treating RN: [birth year] (29 y.o. [place] Sink Date of Birth/Sex: Female) Other Clinician: Primary Care Physician: KUNEFF, RENEE Treating Treasa Bradshaw Referring Physician: Felix Pacini Physician/Extender: Weeks in Treatment: 14 Cellular or Tissue Based Wound #1 Right,Lateral Malleolus Product Type Applied to: Performed By: Physician Tristan Schroeder., MD Cellular or Tissue Based Apligraf Product Type: Time-Out Taken: Yes Location: trunk / arms / legs Wound Size (sq cm): 0.6 Product Size (sq cm): 44 Waste Size (sq cm): 43 Waste Reason: extra Amount of Product Applied (sq cm): 1 Lot #: [id number] Expiration Date: 01/01/2015 Fenestrated: Yes Instrument: Blade Reconstituted: Yes Solution Type: saline Solution Amount: 50m Lot #: [id number] Solution  Expiration 08/14/2016 Date: Secured: Yes Secured With: Steri-Strips Dressing Applied: Yes Primary Dressing: mepitel Procedural Pain: 0 Post Procedural Pain: 0 Response to Treatment: Procedure was tolerated well Post Procedure Diagnosis Same as Pre-procedure Electronic Signature(s) Signed: 12/28/2014 1:57:58 PM By: Evlyn Kanner MD, FACS Previous Signature: 12/28/2014 1:53:01 PM Version By: Elpidio Eric [name], RN Demore, [name] ([id number]) Entered By: Evlyn Kanner on 12/28/2014 13:57:58 Rinn, [name] ([id number]) -------------------------------------------------------------------------------- HPI Details Patient Name: [name], [name]. Date of Service: 12/28/2014 1:00 PM Medical Record Patient Account Number: [id number] Number: [name], RN, [name], Treating RN: 07-Jul-1985 (29 y.o. [place] Sink Date of Birth/Sex: Female) Other Clinician: Primary Care Physician: KUNEFF, RENEE Treating Evlyn Kanner Referring Physician: Felix Pacini Physician/Extender: Weeks in Treatment: 14 History of Present Illness Location: the lateral part of her right ankle Quality: Patient reports experiencing a dull pain to affected area(s). Severity: Patient states wound are getting worse. Duration: Patient has had the wound for < 2 weeks prior to presenting for treatment Timing: Pain in wound is Intermittent (comes and goes Context: The wound appeared gradually over time Modifying Factors: Consults to this date include:seen in the ER and asked to follow up with wound care. Associated Signs and Symptoms: Patient reports having difficulty standing for long periods. HPI Description: 29 year old patient who started with having ulcerations on the right lower leg on the lateral part of her ankle for about 2 weeks. She was seen in the ER at [facility] and advised to see the wound care for a consultation. No X-rays of workup was done during the ER visit and no prescription for any medications of compression  wraps were given. the patient is not diabetic but does have hypertension and her medications have been reviewed by me. 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 The right and left greater saphenous vein was found to be tortuous. Deep venous system was also not competent and there was reflux of more than 500 ms. She was then seen by Dr. Tawanna Cooler Early who recommended that the patient would not benefit from endovenous ablation and he had recommended vein stripping on the right side and multiple small phlebectomy procedures on the left side. the patient did not follow-up due to social economic reasons. She has not been wearing any compression stockings and has not taken any specific treatment for varicose veins for the last 3 years. 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 We have obtained some appointments to see the vascular surgeons by the end of the month and the patient would like to follow up with me at my [facility] on Wednesday, June 29. 10/14/2014 -- she could not see me yesterday in [place] and hence has come for a review today. She has a vascular workup to be done this afternoon at [facility]. She is doing fine otherwise. 10/22/2014 -- she was seen by Dr. [name] and he has recommended surgical removal of her right saphenous vein from distal thigh to saphenofemoral junction and stab phlebectomy's of multiple large tributary branches throughout her thigh and calf. This would be done under general anesthesia in the outpatient setting. 10/29/2014 -- she is trying to work on a surgical date and in the meanwhile we have got insurance [place] [place] [employer] ([id number]) clearance for Apligraf and we will start this  next week. 7/22 2016 -- she is here for the first application of Apligraf. 11/19/2014 -- she is here for a second application of Apligraf 11/26/2014 -- she has done fine after her last application of Apligraf and is awaiting her surgery which is scheduled for August 31. 12/03/2014 -- she is doing fine and is here for her third application of Apligraf. 12/21/2014 -- She had surgery on 12/15/2014 by Dr.Early who did #1 ligation and stripping of right great saphenous vein from distal thigh to saphenofemoral junction, #2 stab phlebectomy of large tributary varicose veins in the thigh popliteal space and calf. She had an Ace wrap up to her groin and this was removed today and the Unna's boot was also removed. 12/28/2014 -- she is here for her fourth application of Apligraf Electronic Signature(s) Signed: 12/28/2014 1:58:30 PM By: Evlyn Kanner MD, FACS Entered By: Evlyn Kanner on 12/28/2014 13:58:30 Banghart, [name] ([id number]) -------------------------------------------------------------------------------- Physical Exam Details Patient Name: JASSMIN, KEMMERER. Date of Service: 12/28/2014 1:00 PM Medical Record Patient Account Number: [id number] Number: [name], RN, [name], Treating RN: 04-10-1986 (28 y.o.  Sink Date of Birth/Sex: Female) Other Clinician: Primary Care Physician: KUNEFF, RENEE Treating Cheri Ayotte Referring Physician: KUNEFF, RENEE Physician/Extender: Weeks in Treatment: 14 Constitutional . Pulse regular. Respirations normal and unlabored. Afebrile. . Eyes Nonicteric. Reactive to light. Ears, Nose, Mouth, and Throat Lips, teeth, and gums WNL.Marland Kitchen Moist mucosa without lesions . Neck supple and nontender. No palpable supraclavicular or cervical adenopathy. Normal sized without goiter. Respiratory WNL. No retractions.. Cardiovascular Pedal Pulses WNL. No clubbing, cyanosis or edema. Lymphatic No adneopathy. No adenopathy. No adenopathy. Musculoskeletal Adexa  without tenderness or enlargement.. Digits and nails w/o clubbing, cyanosis, infection, petechiae, ischemia, or inflammatory conditions.. Integumentary (Hair, Skin) No suspicious lesions. No crepitus or fluctuance. No peri-wound warmth or erythema. No masses.Marland Kitchen Psychiatric Judgement and insight Intact.. No evidence of depression,  anxiety, or agitation.. Notes The wound overall looks excellent and has gone down in size significantly. After washing out the wound she is ready for her fourth application of Apligraf. Electronic Signature(s) Signed: 12/28/2014 1:59:14 PM By: Evlyn Kanner MD, FACS Entered By: Evlyn Kanner on 12/28/2014 13:59:14 Dimperio, [name] ([id number]) -------------------------------------------------------------------------------- Physician Orders Details Patient Name: [name], [name]. Date of Service: 12/28/2014 1:00 PM Medical Record Patient Account Number: [id number] Number: [name], RN, [name], Treating RN: 03-09-1986 (28 y.o. [place] Sink Date of Birth/Sex: Female) Other Clinician: Primary Care Physician: KUNEFF, RENEE Treating Deon Ivey Referring Physician: Felix Pacini Physician/Extender: Weeks in Treatment: 80 Verbal / Phone Orders: Yes Clinician: [name], RN, [name], Rita Read Back and Verified: Yes Diagnosis Coding Wound Cleansing Wound #1 Right,Lateral Malleolus o Cleanse wound with mild soap and water Skin Barriers/Peri-Wound Care Wound #1 Right,Lateral Malleolus o Moisturizing lotion Primary Wound Dressing Wound #1 Right,Lateral Malleolus o Drawtex Secondary Dressing Wound #1 Right,Lateral Malleolus o ABD pad Dressing Change Frequency Wound #1 Right,Lateral Malleolus o Change dressing every week Follow-up Appointments Wound #1 Right,Lateral Malleolus o Return Appointment in 1 week. Edema Control Wound #1 Right,Lateral Malleolus o Unna Boot to Right Lower Extremity Additional Orders / Instructions Wound #1 Right,Lateral  Malleolus o Increase protein intake. TATEM, HOLSONBACK ([id number]) Advanced Therapies Wound #1 Right,Lateral Malleolus o Apligraf application in clinic; including contact layer, fixation with steri strips, dry gauze and cover dressing. Electronic Signature(s) Signed: 12/28/2014 1:41:15 PM By: Elpidio Eric [name], RN Signed: 12/28/2014 4:28:23 PM By: Evlyn Kanner MD, FACS Entered By: Elpidio Eric on 12/28/2014 13:41:14 Ratchford, [name] -------------------------------------------------------------------------------- Problem List Details Patient Name: [name], [name]. Date of Service: 12/28/2014 1:00 PM Medical Record Patient Account Number: [id number] Number: [name], RN, [name], Treating RN: [birth year] (28 y.o. 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 [place] Sink Date of Birth/Sex: Female) Other Clinician: Primary Care Physician: KUNEFF, RENEE Treating Evlyn Kanner Referring Physician: Felix Pacini Physician/Extender: Weeks in  Treatment: 14 Subjective Chief Complaint Information obtained from Patient Patient presents for treatment of an open ulcer due to venous insufficiency. this 29 year old patient has had an ulcer on the lateral part of her right lower extremity and this has been there for 2 weeks. History of Present Illness (HPI) The following HPI elements were documented for the patient's wound: Location: the lateral part of her right ankle Quality: Patient reports experiencing a dull pain to affected area(s). Severity: Patient states wound are getting worse. Duration: Patient has had the wound for < 2 weeks prior to presenting for treatment Timing: Pain in wound is Intermittent (comes and goes Context: The wound appeared gradually over time Modifying Factors: Consults to this date include:seen in the ER and asked to follow up with wound care. Associated Signs and Symptoms: Patient reports having difficulty standing for long periods.  29 year old patient who started with having ulcerations on the right lower leg on the lateral part of her ankle for about 2 weeks. She was seen in the ER at [facility] and advised to see the wound care for a consultation. No X-rays of workup was done during the ER visit and no prescription for any medications of compression wraps were given. the patient is not diabetic but does have hypertension and her medications have been reviewed by me. 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 The right and left greater saphenous vein was found to be tortuous. Deep venous system was also not competent and there was reflux of more than 500 ms. She was then seen by Dr. Tawanna Cooler Early who recommended that the patient would not benefit from endovenous ablation and he had recommended vein stripping on the right side and multiple  small phlebectomy procedures on the left side. the patient did not follow-up due to social economic reasons. She has not been wearing any compression stockings and has not taken any specific treatment for varicose veins for the last 3 years. 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 We have obtained some appointments to see the vascular surgeons by the end of the month and the patient would like to follow up with me at my [facility] on Wednesday, June 29. [name], [name] ([id number]) 10/14/2014 -- she could not see me yesterday in [place] and hence has come for a review today. She has a vascular workup to be done this afternoon at [facility]. She is doing fine otherwise. 10/22/2014 -- she was seen by Dr. [name] and he has recommended surgical removal of her right saphenous vein from distal thigh to saphenofemoral junction and stab phlebectomy's of multiple large tributary branches throughout her thigh and calf. This would be done under general anesthesia in the outpatient setting. 10/29/2014 -- she is trying to work on a surgical date and in the meanwhile we have got insurance clearance for Apligraf and we will start this next week. 7/22 2016 -- she is here for the first application of Apligraf. 11/19/2014 -- she is here for a second application of Apligraf 11/26/2014 -- she has done fine after her last application of Apligraf and is awaiting her surgery which is scheduled for August 31. 12/03/2014 -- she is doing fine and is here for her third application of Apligraf. 12/21/2014 -- She had surgery on 12/15/2014 by Dr.Early who did #1 ligation and stripping of right great saphenous vein from distal thigh to saphenofemoral junction, #2 stab phlebectomy of large tributary varicose veins in the thigh popliteal space and calf. She had an Ace wrap up to her groin and this was  removed today and the Unna's boot was also removed. 12/28/2014 -- she is here for her fourth application of Apligraf Objective Constitutional Pulse regular. Respirations normal and unlabored. Afebrile. Vitals Time Taken: 1:16 PM, Height: 66 in, Weight: 350.3 lbs, BMI: 56.5, Temperature: 98.5 F, Pulse: 88 bpm, Respiratory Rate: 18 breaths/min, Blood Pressure: 152/74 mmHg. Eyes Nonicteric. Reactive to light. Ears, Nose, Mouth, and Throat Lips, teeth, and gums WNL.Marland Kitchen Moist mucosa without lesions . Neck supple and nontender. No palpable supraclavicular or cervical adenopathy. Normal sized without goiter. Respiratory  WNL. No retractions.. Cardiovascular Pedal Pulses WNL. No clubbing, cyanosis or edema. [name], [name] ([id number]) Lymphatic No adneopathy. No adenopathy. No adenopathy. Musculoskeletal Adexa without tenderness or enlargement.. Digits and nails w/o clubbing, cyanosis, infection, petechiae, ischemia, or inflammatory conditions.Marland Kitchen Psychiatric Judgement and insight Intact.. No evidence of depression, anxiety, or agitation.. General Notes: The wound overall looks excellent and has gone down in size significantly. After washing out the wound she is ready for her fourth application of Apligraf. Integumentary (Hair, Skin) No suspicious lesions. No crepitus or fluctuance. No peri-wound warmth or erythema. No masses.. Wound #1 status is Open. Original cause of wound was Not Known. The wound is located on the Right,Lateral Malleolus. The wound measures 0.5cm length x 1.2cm width x 0.1cm depth; 0.471cm^2 area and 0.047cm^3 volume. The wound is limited to skin breakdown. There is no tunneling or undermining noted. There is a medium amount of serosanguineous drainage noted. The wound margin is distinct with the outline attached to the wound base. There is large (67-100%) granulation within the wound bed. There is a small (1-33%) amount of necrotic tissue within the wound bed including  Adherent Slough. The periwound skin appearance exhibited: Localized Edema, Moist. The periwound skin appearance did not exhibit: Callus, Crepitus, Excoriation, Fluctuance, Friable, Induration, Rash, Scarring, Dry/Scaly, Maceration, Atrophie Blanche, Cyanosis, Ecchymosis, Hemosiderin Staining, Mottled, Pallor, Rubor, Erythema. Periwound temperature was noted as No Abnormality. The periwound has tenderness on palpation. 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 After washing out the wound she is ready for her fourth application of Apligraf. this was done with the usual sterile precautions and appropriate bolster and on his boot was applied. She will come back for a wound check next week. [name], [name] ([id number]) Procedures Wound #1 Wound #1 is a Venous Leg Ulcer located on the Right,Lateral Malleolus. A skin graft procedure using a bioengineered skin substitute/cellular or tissue based product was performed by Jonella Redditt, Ignacia [name]., MD. Apligraf was applied and secured with Steri-Strips. 1 sq cm of product was utilized and 43 sq cm was wasted due to extra. Post Application, mepitel was applied. A Time Out was conducted prior to the start of the procedure. The procedure was tolerated well with a pain level of 0 throughout and a pain level of 0 following the procedure. Post procedure Diagnosis Wound #1: Same as Pre-Procedure . Plan Wound Cleansing: Wound #1 Right,Lateral Malleolus: Cleanse wound with mild soap and water Skin Barriers/Peri-Wound Care: Wound #1 Right,Lateral Malleolus: Moisturizing lotion Primary Wound Dressing: Wound #1 Right,Lateral Malleolus: Drawtex Secondary Dressing: Wound #1 Right,Lateral  Malleolus: ABD pad Dressing Change Frequency: Wound #1 Right,Lateral Malleolus: Change dressing every week Follow-up Appointments: Wound #1 Right,Lateral Malleolus: Return Appointment in 1 week. Edema Control: Wound #1 Right,Lateral Malleolus: Unna Boot to Right Lower Extremity Additional Orders / Instructions: Wound #1 Right,Lateral Malleolus: Increase protein intake. Advanced Therapies: Wound #1 Right,Lateral Malleolus: Apligraf application in clinic; including contact layer, fixation with steri strips, dry gauze and cover dressing. [name], [name] ([id number]) The wound overall looks excellent and has gone down in size significantly. After washing out the wound she is ready for her fourth application of Apligraf. this was done with the usual sterile precautions and appropriate bolster and on his boot was applied. She will come  back for a wound check next week. Electronic Signature(s) Signed: 12/30/2014 4:28:45 PM By: Evlyn Kanner MD, FACS Previous Signature: 12/28/2014 1:59:59 PM Version By: Evlyn Kanner MD, FACS Entered By: Evlyn Kanner on 12/30/2014 16:28:45 Guay, [name] ([id number]) -------------------------------------------------------------------------------- SuperBill Details Patient Name: [name], [name]. Date of Service: 12/28/2014 Medical Record Patient Account Number: [id number] Number: [name], RN, [name], Treating RN: 03-18-1986 (28 y.o. 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 E66.01 Morbid (severe) obesity due to excess calories Facility Procedures CPT4 Code Description: [id number] (Facility Use Only) Apligraf 1 SQ  CM Modifier: Quantity: 44 CPT4 Code Description: [id number] 15271 - SKIN SUB GRAFT TRNK/ARM/LEG ICD-10 Description Diagnosis I83.013 Varicose veins of right lower extremity with ulcer L97.312 Non-pressure chronic ulcer of right ankle with fat E66.01 Morbid (severe) obesity due to  excess calories Modifier: of ankle layer exposed Quantity: 1 Physician Procedures CPT4 Code Description: [id number] 15271 - WC PHYS SKIN SUB GRAFT TRNK/ARM/LEG ICD-10 Description Diagnosis I83.013 Varicose veins of right lower extremity with ulcer o L97.312 Non-pressure chronic ulcer of right ankle with fat l E66.01 Morbid (severe)  obesity due to excess calories Modifier: f ankle ayer exposed Quantity: 1 Electronic Signature(s) Signed: 12/28/2014 2:00:11 PM By: Evlyn Kanner MD, FACS Entered By: Evlyn Kanner on 12/28/2014 14:00:10

## 2014-12-31 ENCOUNTER — Encounter (HOSPITAL_COMMUNITY): Payer: Self-pay | Admitting: *Deleted

## 2014-12-31 ENCOUNTER — Emergency Department (HOSPITAL_COMMUNITY)
Admission: EM | Admit: 2014-12-31 | Discharge: 2014-12-31 | Disposition: A | Discharge status: Home / Self Care | Payer: BLUE CROSS/BLUE SHIELD | Attending: Emergency Medicine | Admitting: Emergency Medicine

## 2014-12-31 ENCOUNTER — Telehealth: Payer: Self-pay | Admitting: *Deleted

## 2014-12-31 DIAGNOSIS — E669 Obesity, unspecified: Secondary | ICD-10-CM | POA: Insufficient documentation

## 2014-12-31 DIAGNOSIS — M79604 Pain in right leg: Secondary | ICD-10-CM | POA: Diagnosis present

## 2014-12-31 DIAGNOSIS — I10 Essential (primary) hypertension: Secondary | ICD-10-CM | POA: Diagnosis not present

## 2014-12-31 DIAGNOSIS — L97911 Non-pressure chronic ulcer of unspecified part of right lower leg limited to breakdown of skin: Secondary | ICD-10-CM | POA: Insufficient documentation

## 2014-12-31 DIAGNOSIS — Z79899 Other long term (current) drug therapy: Secondary | ICD-10-CM | POA: Insufficient documentation

## 2014-12-31 MED ORDER — CEPHALEXIN 500 MG PO CAPS: 1000.0000 mg | ORAL_CAPSULE | Freq: Two times a day (BID) | ORAL | Status: DC

## 2014-12-31 NOTE — ED Provider Notes (Signed)
CSN: [id number]     Arrival date & time 12/31/14  1737 History  This chart was scribed for non-physician practitioner Fayrene Helper, PA-C working with Zadie Rhine, MD by Lyndel Safe, ED Scribe. This patient was seen in room TR11C/TR11C and the patient's care was started at 6:07 PM.    Chief Complaint  Patient presents with  . Leg Pain   The history is provided by the patient. No language interpreter was used.   HPI Comments: [name] is a 29 y.o. female, with a PMhx of varicose veins, who presents to the Emergency Department complaining of gradually worsening, constant wounds and erythema to the folds of posterior, right mid leg onset 3 days s/p vein stripping and ligation procedure to affected area 14 days ago.The wounds are characteristic of ulcers. Pt reports that the ace bandage that was placed on her leg after the surgery has irritated the site of incision; there are also steri strips in place. Surgery performed by vascular surgeon Dr. Arbie Cookey with whom she has a follow up with in 4 days. Denies the area to be painful, fevers or a chance of pregnancy. NKDA.  Denies numbness or weakness.    Past Medical History  Diagnosis Date  . Obesity   . Varicose veins   . Ankle ulcer   . 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 Hypertension    Past Surgical History  Procedure Laterality Date  . Tonsillectomy    . Tympanostomy tube placement    . Vein ligation and stripping Right 12/15/2014    Procedure: SAPHENOUS VEIN LIGATION AND STRIPPING; GREATER THAN 20 STAB PHLEBECTOMIES;  Surgeon: Larina Earthly, MD;  Location: [facility] OR;  Service: Vascular;  Laterality: Right;   Family History  Problem Relation Age of Onset  . Diabetes Mother   . Varicose Veins Mother   . Varicose Veins Sister    Social History  Substance Use Topics  . Smoking status: Never Smoker   . Smokeless tobacco: Never Used  . Alcohol Use: 0.6 oz/week    1 Glasses of wine per week     Comment: occasional   OB History    No data available     Review of Systems  Constitutional: Negative for fever.  Skin: Positive for color change ( localized erythema ).  All other systems reviewed and are negative.  Allergies  Review of patient's allergies indicates no known allergies.  Home Medications   Prior to Admission medications   Medication Sig Start Date End Date Taking? Authorizing Provider  amoxicillin (AMOXIL) 500 MG capsule Take 2 grams (4 tablets) by mouth the evening prior to procedure with a meal. 12/02/14   Larina Earthly, MD  hydrochlorothiazide (HYDRODIURIL) 12.5 MG tablet Take 1 tablet (12.5 mg total) by mouth daily. 02/05/14   [name], DO  naproxen sodium (ANAPROX) 220 MG tablet Take 880 mg by mouth daily as needed (pain).    Historical Provider, MD  oxyCODONE (ROXICODONE) 5 MG immediate release tablet Take 1 tablet (5 mg total) by mouth every 6 (six) hours as needed. 12/15/14   Samantha J Rhyne, PA-C   BP 113/80 mmHg  Pulse 109  Temp(Src) 98.6 F (37 C) (Oral)  Resp 18  SpO2 100%  LMP 07/14/2014 Physical Exam  Constitutional: She appears well-developed and well-nourished. No distress.  HENT:  Head: Normocephalic.  Eyes: Conjunctivae are normal.  Neck: No JVD present.  Cardiovascular: Intact distal pulses.   Pulmonary/Chest: Effort normal. No respiratory distress.  Musculoskeletal: She exhibits tenderness.  At the right medial knee; 2 partial thickness ulcerations the size of a dime each with faint surrounding erythema, without pustule discharge or abscess, mildly TTP. Dressing to R lower extremity was not removed. However leg nontender  Neurological: She is alert. Coordination normal.  Skin: Skin is warm. No rash noted. No erythema. No pallor.  Psychiatric: She has a normal mood and affect. Her behavior is normal.  Nursing note and vitals reviewed.   ED Course  Procedures  DIAGNOSTIC STUDIES: Oxygen Saturation is 100% on RA, normal by my  interpretation.    COORDINATION OF CARE: 6:15 PM pt has skin ulcer to medial R thigh at skin fold.  No abscess or worsening cellulitis.  Discussed treatment plan which includes to prescribe Keflex and Bactrim with pt.  Advised pt to apply neosporin and a loose bandage and keep her follow up appointment with Dr. Arbie Cookey. Pt acknowledges and agrees to plan.   MDM   Final diagnoses:  Ulcer of right leg, limited to breakdown of skin    BP 102/53 mmHg  Pulse 97  Temp(Src) 98.6 F (37 C) (Oral)  Resp 18  SpO2 100%  LMP 07/14/2014   I personally performed the services described in this documentation, which was scribed in my presence. The recorded information has been reviewed and is accurate.    Fayrene Helper, PA-C 12/31/14 1829  Fayrene Helper, PA-C 12/31/14 1830  Zadie Rhine, MD 01/01/15 [phone]

## 2014-12-31 NOTE — Telephone Encounter (Signed)
Avneet called to report that her mother noticed some areas of her incisions that "are draining pus and are red" since last night. She says that she is afebrile but doesn't really know. She wanted to be seen this afternoon but I told her that our office would be closing at 5:00 and that we didn't have a provider here. She evidently has been wearing her ACE wrap dressing some even though Okey Regal spoke to her on 12-21-14 and told her to d/c it. She says that she "has blistering on her skin were the wraps were".  She states that she will be going to the ED for evaluation and to get some antiobiotics for this. Her next appt with Dr. Arbie Cookey is next Tuesday 01-04-15 for her 1st postop visit.

## 2014-12-31 NOTE — ED Notes (Signed)
The pt had a vein stripping and ligation in her rt leg 2 weeks ago.  The ace bandage placed has rubbed a portion of an incision so it is red and swollen.   She has had this for 2-3 days.  lmp irregular 4-5 months ago

## 2014-12-31 NOTE — Discharge Instructions (Signed)
Skin Ulcer  A skin ulcer is an open sore that can be shallow or deep. Skin ulcers sometimes become infected and are difficult to treat. It may be 1 month or longer before real healing progress is made.  CAUSES    Injury.   Problems with the veins or arteries.   Diabetes.   Insect bites.   Bedsores.   Inflammatory conditions.  SYMPTOMS    Pain, redness, swelling, and tenderness around the ulcer.   Fever.   Bleeding from the ulcer.   Yellow or clear fluid coming from the ulcer.  DIAGNOSIS   There are many types of skin ulcers. Any open sores will be examined. Certain tests will be done to determine the kind of ulcer you have. The right treatment depends on the type of ulcer you have.  TREATMENT   Treatment is a long-term challenge. It may include:   Wearing an elastic wrap, compression stockings, or gel cast over the ulcer area.   Taking antibiotic medicines or putting antibiotic creams on the affected area if there is an infection.  HOME CARE INSTRUCTIONS   Put on your bandages (dressings), wraps, or casts over the ulcer as directed by your caregiver.   Change all dressings as directed by your caregiver.   Take all medicines as directed by your caregiver.   Keep the affected area clean and dry.   Avoid injuries to the affected area.   Eat a well-balanced, healthy diet that includes plenty of fruit and vegetables.   If you smoke, consider quitting or decreasing the amount of cigarettes you smoke.   Once the ulcer heals, get regular exercise as directed by your caregiver.   Work with your caregiver to make sure your blood pressure, cholesterol, and diabetes are well-controlled.   Keep your skin moisturized. Dry skin can crack and lead to skin ulcers.  SEEK IMMEDIATE MEDICAL CARE IF:    Your pain gets worse.   You have swelling, redness, or fluids around the ulcer.   You have chills.   You have a fever.  MAKE SURE YOU:    Understand these instructions.   Will watch your condition.   Will get  help right away if you are not doing well or get worse.  Document Released: 05/10/2004 Document Revised: 06/25/2011 Document Reviewed: 11/17/2010  [employer] Patient Information 2015 [employer]. This information is not intended to replace advice given to you by your health care provider. Make sure you discuss any questions you have with your health care provider.

## 2015-01-03 ENCOUNTER — Encounter: Payer: Self-pay | Admitting: Vascular Surgery

## 2015-01-04 ENCOUNTER — Ambulatory Visit (INDEPENDENT_AMBULATORY_CARE_PROVIDER_SITE_OTHER): Payer: BLUE CROSS/BLUE SHIELD | Admitting: Vascular Surgery

## 2015-01-04 ENCOUNTER — Encounter: Payer: Self-pay | Admitting: *Deleted

## 2015-01-04 ENCOUNTER — Encounter: Payer: Self-pay | Admitting: Vascular Surgery

## 2015-01-04 ENCOUNTER — Ambulatory Visit: Payer: BLUE CROSS/BLUE SHIELD | Admitting: Surgery

## 2015-01-04 VITALS — BP 112/82 | HR 80 | Temp 97.7°F | Resp 16 | Ht 66.0 in | Wt 342.0 lb

## 2015-01-04 DIAGNOSIS — I83218 Varicose veins of right lower extremity with both ulcer of other part of lower extremity and inflammation: Secondary | ICD-10-CM

## 2015-01-04 DIAGNOSIS — I83213 Varicose veins of right lower extremity with both ulcer of ankle and inflammation: Secondary | ICD-10-CM

## 2015-01-04 DIAGNOSIS — I83212 Varicose veins of right lower extremity with both ulcer of calf and inflammation: Secondary | ICD-10-CM

## 2015-01-04 DIAGNOSIS — I83214 Varicose veins of right lower extremity with both ulcer of heel and midfoot and inflammation: Secondary | ICD-10-CM

## 2015-01-04 DIAGNOSIS — I83215 Varicose veins of right lower extremity with both ulcer other part of foot and inflammation: Secondary | ICD-10-CM

## 2015-01-04 DIAGNOSIS — I83219 Varicose veins of right lower extremity with both ulcer of unspecified site and inflammation: Secondary | ICD-10-CM

## 2015-01-04 DIAGNOSIS — I83211 Varicose veins of right lower extremity with both ulcer of thigh and inflammation: Secondary | ICD-10-CM

## 2015-01-04 DIAGNOSIS — L97319 Non-pressure chronic ulcer of right ankle with unspecified severity: Principal | ICD-10-CM

## 2015-01-04 DIAGNOSIS — I83013 Varicose veins of right lower extremity with ulcer of ankle: Secondary | ICD-10-CM

## 2015-01-04 NOTE — Progress Notes (Signed)
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 Surgery was on 12/15/2014. She has an well following surgery. [facility] reports that her venous stasis ulcers improving. She did report that the compression dressing bunched up behind her knee and she did have skin irritation the popliteal space. She was seen in the emergency room for this was instructed to use Triple Antibiotic ointment was placed on oral anabiotic.  This area all looks fine with no evidence of infection. Does have partial thickness skin loss at the bend behind her knee. The phlebectomy sites all look quite good as does the stripping site.  She we'll continue her elevation when possible. We'll continue follow-up the wound center. Will see [place] again in 6 weeks follow-up of the skin irritation and skin loss in the area behind her knee. I instructed her to notify us if this is completely healed and there are no concerns she can cancel her next appointment and see [place] on as-needed basis. She does have reflux in the great saphenous vein and the left leg but I would not recommend treatment unless she has progressive difficulty in the left leg

## 2015-01-06 ENCOUNTER — Encounter: Payer: BLUE CROSS/BLUE SHIELD | Admitting: Surgery

## 2015-01-06 DIAGNOSIS — L97312 Non-pressure chronic ulcer of right ankle with fat layer exposed: Secondary | ICD-10-CM | POA: Diagnosis not present

## 2015-01-07 NOTE — Progress Notes (Signed)
JOSS, FRIEDEL ([id number]) Visit Report for 01/06/2015 Arrival Information Details Patient Name: [name], [name]. Date of Service: 01/06/2015 9:15 AM Medical Record Number: [id number] Patient Account Number: [id number] Date of Birth/Sex: 05/09/1985 (28 y.o. Female) Treating RN: [name] Primary Care Physician: Felix Pacini Other Clinician: Referring Physician: Felix Pacini Treating Physician/Extender: Rudene Re in Treatment: 15 Visit Information History Since Last Visit Added or deleted any medications: No Patient Arrived: Ambulatory Any new allergies or adverse reactions: No Arrival Time: 09:26 Had a fall or experienced change in No Accompanied By: self activities of daily living that may affect Transfer Assistance: None risk of falls: Patient Identification Verified: Yes Signs or symptoms of abuse/neglect since last No Secondary Verification Process Yes visito Completed: Hospitalized since last visit: No Patient Has Alerts: Yes Has Dressing in Place as Prescribed: Yes Patient Alerts: 09/2013 ABI R: Has Compression in Place as Prescribed: Yes 1.3 Pain Present Now: No Electronic Signature(s) Signed: 01/06/2015 6:06:48 PM By: Elliot Gurney, RN, [name], [name], [name] Entered By: Elliot Gurney, RN, [name], Kim on 01/06/2015 09:31:05 [name], [name] ([id number]) -------------------------------------------------------------------------------- Encounter Discharge Information Details Patient Name: [name], [name]. Date of Service: 01/06/2015 9:15 AM Medical Record Number: [id number] Patient Account Number: [id number] Date of Birth/Sex: 1985/04/20 (28 y.o. Female) Treating RN: [name] Primary Care Physician: Felix Pacini Other Clinician: Referring Physician: Felix Pacini Treating Physician/Extender: Rudene Re in Treatment: 15 Encounter Discharge Information Items Discharge Pain Level: 0 Discharge Condition: Stable Ambulatory Status: Ambulatory Discharge Destination:  Home Transportation: Private Auto Accompanied By: self Schedule Follow-up Appointment: Yes Medication Reconciliation completed and provided to Patient/Care Yes Provider: Provided on Clinical Summary of Care: 01/06/2015 Form Type Recipient Paper Patient SS Electronic Signature(s) Signed: 01/06/2015 9:55:58 AM By: Gwenlyn Perking Entered By: Gwenlyn Perking on 01/06/2015 09:55:58 [name], [name] ([id number]) -------------------------------------------------------------------------------- Lower Extremity Assessment Details Patient Name: [name], [name] Date of Service: 01/06/2015 9:15 AM Medical Record Number: [id number] Patient Account Number: [id number] Date of Birth/Sex: 04-19-1985 (28 y.o. Female) Treating RN: [name] Primary Care Physician: Felix Pacini Other Clinician: Referring Physician: Felix Pacini Treating Physician/Extender: Rudene Re in Treatment: 15 Edema Assessment Assessed: [Left: No] [Right: No] E[Left: dema] [Right: :] Calf Left: Right: Point of Measurement: 34 cm From Medial Instep cm 54 cm Ankle Left: Right: Point of Measurement: 14 cm From Medial Instep cm 29 cm Vascular Assessment Pulses: Posterior Tibial Dorsalis Pedis Palpable: [Right:Yes] Extremity colors, hair growth, and conditions: Extremity Color: [Right:Hyperpigmented] Hair Growth on Extremity: [Right:No] Temperature of Extremity: [Right:Warm] Capillary Refill: [Right:< 3 seconds] Toe Nail Assessment Left: Right: Thick: No Discolored: Yes Deformed: No Improper Length and Hygiene: No Electronic Signature(s) Signed: 01/06/2015 6:06:48 PM By: Elliot Gurney, RN, [name], [name], [name] Entered By: Elliot Gurney, RN, [name], Kim on 01/06/2015 09:34:15 [name], [name] ([id number]) -------------------------------------------------------------------------------- Multi Wound Chart Details Patient Name: [name], [name]. Date of Service: 01/06/2015 9:15 AM Medical Record Number: [id number] Patient Account  Number: [id number] Date of Birth/Sex: 1985-04-26 (28 y.o. Female) Treating RN: [name] Primary Care Physician: Felix Pacini Other Clinician: Referring Physician: Felix Pacini Treating Physician/Extender: Rudene Re in Treatment: 15 Vital Signs Height(in): 66 Pulse(bpm): 89 Weight(lbs): 350.3 Blood Pressure 120/67 (mmHg): Body Mass Index(BMI): 57 Temperature(F): 98.4 Respiratory Rate 18 (breaths/min): Photos: [1:No Photos] [N/A:N/A] Wound Location: [1:Right Malleolus - Lateral] [N/A:N/A] Wounding Event: [1:Not Known] [N/A:N/A] Primary Etiology: [1:Venous Leg Ulcer] [N/A:N/A] Date Acquired: [1:09/06/2014] [N/A:N/A] Weeks of Treatment: [1:15] [N/A:N/A] Wound Status: [1:Open] [N/A:N/A] Measurements L x W x D 0.5x1.2x0.1 [N/A:N/A] (cm) Area (cm) : [  1:0.471] [N/A:N/A] Volume (cm) : [1:0.047] [N/A:N/A] % Reduction in Area: [1:78.60%] [N/A:N/A] % Reduction in Volume: 92.90% [N/A:N/A] Classification: [1:Full Thickness Without Exposed Support Structures] [N/A:N/A] Exudate Amount: [1:Medium] [N/A:N/A] Exudate Type: [1:Serosanguineous] [N/A:N/A] Exudate Color: [1:red, brown] [N/A:N/A] Wound Margin: [1:Distinct, outline attached] [N/A:N/A] Granulation Amount: [1:Large (67-100%)] [N/A:N/A] Necrotic Amount: [1:Small (1-33%)] [N/A:N/A] Exposed Structures: [1:Fascia: No Fat: No Tendon: No Muscle: No Joint: No Bone: No Limited to Skin Breakdown] [N/A:N/A] Epithelialization: Medium (34-66%) N/A N/A Periwound Skin Texture: Edema: Yes N/A N/A Excoriation: No Induration: No Callus: No Crepitus: No Fluctuance: No Friable: No Rash: No Scarring: No Periwound Skin Moist: Yes N/A N/A Moisture: Maceration: No Dry/Scaly: No Periwound Skin Color: Atrophie Blanche: No N/A N/A Cyanosis: No Ecchymosis: No Erythema: No Hemosiderin Staining: No Mottled: No Pallor: No Rubor: No Temperature: No Abnormality N/A N/A Tenderness on Yes N/A N/A Palpation: Wound  Preparation: Ulcer Cleansing: Other: N/A N/A soap and water Topical Anesthetic Applied: Other: lidocaine 4% Assessment Notes: Apligraf recheck- N/A N/A measurements used from last visit. Dressing Not removed, unable to visualize wound bed. Treatment Notes Electronic Signature(s) Signed: 01/06/2015 6:06:48 PM By: Elliot Gurney, RN, [name], [name], [name] Entered By: Elliot Gurney, RN, [name], Kim on 01/06/2015 09:40:55 Latona, [name] ([id number]) -------------------------------------------------------------------------------- Multi-Disciplinary Care Plan Details Patient Name: [name], [name]. Date of Service: 01/06/2015 9:15 AM Medical Record Number: [id number] Patient Account Number: [id number] Date of Birth/Sex: 04/08/1986 (28 y.o. Female) Treating RN: [name] Primary Care Physician: Felix Pacini Other Clinician: Referring Physician: Felix Pacini Treating Physician/Extender: Rudene Re in Treatment: 15 Active Inactive Abuse / Safety / Falls / Self Care Management Nursing Diagnoses: Potential for falls Goals: Patient will remain injury free Date Initiated: 09/20/2014 Goal Status: Active Interventions: Assess fall risk on admission and as needed Notes: Orientation to the Wound Care Program Nursing Diagnoses: Knowledge deficit related to the wound healing center program Goals: Patient/caregiver will verbalize understanding of the Wound Healing Center Program Date Initiated: 09/20/2014 Goal Status: Active Interventions: Provide education on orientation to the wound center Notes: Pain, Acute or Chronic Nursing Diagnoses: Pain Management - Cyclic Acute (Dressing Change Related) Goals: Patient will verbalize adequate pain control and receive pain control interventions during procedures as needed JOLISSA, KAPRAL ([id number]) Date Initiated: 09/20/2014 Goal Status: Active Interventions: Complete pain assessment as per visit requirements Notes: Venous Leg Ulcer Nursing  Diagnoses: Potential for venous Insuffiency (use before diagnosis confirmed) Goals: Non-invasive venous studies are completed as ordered Date Initiated: 09/20/2014 Goal Status: Active Interventions: Assess peripheral edema status every visit. Notes: Wound/Skin Impairment Nursing Diagnoses: Impaired tissue integrity Goals: Ulcer/skin breakdown will have a volume reduction of 30% by week 4 Date Initiated: 09/20/2014 Goal Status: Active Interventions: Assess ulceration(s) every visit Notes: Electronic Signature(s) Signed: 01/06/2015 6:06:48 PM By: Elliot Gurney, RN, [name], [name], [name] Entered By: Elliot Gurney, RN, [name], Kim on 01/06/2015 09:40:44 Leccese, [name] ([id number]) -------------------------------------------------------------------------------- Pain Assessment Details Patient Name: [name], [name]. Date of Service: 01/06/2015 9:15 AM Medical Record Number: [id number] Patient Account Number: [id number] Date of Birth/Sex: Aug 17, 1985 (28 y.o. Female) Treating RN: [name] Primary Care Physician: Felix Pacini Other Clinician: Referring Physician: Felix Pacini Treating Physician/Extender: Rudene Re in Treatment: 15 Active Problems Location of Pain Severity and Description of Pain Patient Has Paino No Site Locations Pain Management and Medication Current Pain Management: Electronic Signature(s) Signed: 01/06/2015 6:06:48 PM By: Elliot Gurney, RN, [name], [name], [name] Entered By: Elliot Gurney, RN, [name], Kim on 01/06/2015 09:31:14 Jafari, [name] ([id number]) -------------------------------------------------------------------------------- Patient/Caregiver Education Details Patient Name: [name],  [name] Date of Service: 01/06/2015 9:15 AM Medical Record Number: [id number] Patient Account Number: [id number] Date of Birth/Gender: 19-May-1985 (28 y.o. Female) Treating RN: [name] Primary Care Physician: Felix Pacini Other Clinician: Referring Physician: Felix Pacini Treating  Physician/Extender: Rudene Re in Treatment: 15 Education Assessment Education Provided To: Patient Education Topics Provided Wound/Skin Impairment: Handouts: Other: wound care as prescribed Methods: Demonstration, Explain/Verbal Responses: State content correctly Electronic Signature(s) Signed: 01/06/2015 6:06:48 PM By: Elliot Gurney, RN, [name], [name], [name] Entered By: Elliot Gurney, RN, [name], Kim on 01/06/2015 09:48:45 Rieves, [name] ([id number]) -------------------------------------------------------------------------------- Wound Assessment Details Patient Name: [name], [name]. Date of Service: 01/06/2015 9:15 AM Medical Record Number: [id number] Patient Account Number: [id number] Date of Birth/Sex: 10-13-1985 (28 y.o. Female) Treating RN: [name] Primary Care Physician: Felix Pacini Other Clinician: Referring Physician: Felix Pacini Treating Physician/Extender: Rudene Re in Treatment: 15 Wound Status Wound Number: 1 Primary Etiology: Venous Leg Ulcer Wound Location: Right Malleolus - Lateral Wound Status: Open Wounding Event: Not Known Date Acquired: 09/06/2014 Weeks Of Treatment: 15 Clustered Wound: No Photos Photo Uploaded By: Elliot Gurney, RN, [name], Kim on 01/06/2015 12:07:43 Wound Measurements Length: (cm) 0.5 Width: (cm) 1.2 Depth: (cm) 0.1 Area: (cm) 0.471 Volume: (cm) 0.047 % Reduction in Area: 78.6% % Reduction in Volume: 92.9% Epithelialization: Medium (34-66%) Wound Description Full Thickness Without Exposed Classification: Support Structures Wound Margin: Distinct, outline attached Exudate Medium Amount: Exudate Type: Serosanguineous Exudate Color: red, brown Foul Odor After Cleansing: No Wound Bed Granulation Amount: Large (67-100%) Exposed Structure Necrotic Amount: Small (1-33%) Fascia Exposed: No Necrotic Quality: Adherent Slough Fat Layer Exposed: No [name], [name]. ([id number]) Tendon Exposed: No Muscle Exposed: No Joint  Exposed: No Bone Exposed: No Limited to Skin Breakdown Periwound Skin Texture Texture Color No Abnormalities Noted: No No Abnormalities Noted: No Callus: No Atrophie Blanche: No Crepitus: No Cyanosis: No Excoriation: No Ecchymosis: No Fluctuance: No Erythema: No Friable: No Hemosiderin Staining: No Induration: No Mottled: No Localized Edema: Yes Pallor: No Rash: No Rubor: No Scarring: No Temperature / Pain Moisture Temperature: No Abnormality No Abnormalities Noted: No Tenderness on Palpation: Yes Dry / Scaly: No Maceration: No Moist: Yes Wound Preparation Ulcer Cleansing: Other: soap and water, Topical Anesthetic Applied: Other: lidocaine 4%, Assessment Notes Apligraf recheck- measurements used from last visit. Dressing Not removed, unable to visualize wound bed. Treatment Notes Wound #1 (Right, Lateral Malleolus) 1. Cleansed with: Cleanse wound with antibacterial soap and water 5. Secondary Dressing Applied ABD Pad 7. Secured with Henriette Combs to Right Lower Extremity Electronic Signature(s) Signed: 01/06/2015 6:06:48 PM By: Elliot Gurney, RN, [name], [name], [name] Entered By: Elliot Gurney, RN, [name], Kim on 01/06/2015 09:35:47 [name], [name] ([id number]) -------------------------------------------------------------------------------- Vitals Details Patient Name: [name], [name]. Date of Service: 01/06/2015 9:15 AM Medical Record Number: [id number] Patient Account Number: [id number] Date of Birth/Sex: 1985/11/28 (28 y.o. Female) Treating RN: [name] Primary Care Physician: Felix Pacini Other Clinician: Referring Physician: Felix Pacini Treating Physician/Extender: Rudene Re in Treatment: 15 Vital Signs Time Taken: 09:31 Temperature (F): 98.4 Height (in): 66 Pulse (bpm): 89 Weight (lbs): 350.3 Respiratory Rate (breaths/min): 18 Body Mass Index (BMI): 56.5 Blood Pressure (mmHg): 120/67 Reference Range: 80 - 120 mg / dl Electronic Signature(s) Signed:  01/06/2015 6:06:48 PM By: Elliot Gurney, RN, [name], [name], [name] Entered By: Elliot Gurney, RN, [name], Kim on 01/06/2015 09:31:33

## 2015-01-07 NOTE — Progress Notes (Signed)
[name], [name] ([id number]) Visit Report for 01/06/2015 Chief Complaint Document Details Patient Name: [name], [name]. Date of Service: 01/06/2015 9:15 AM Medical Record Number: [id number] Patient Account Number: [id number] Date of Birth/Sex: 07/24/1985 (28 y.o. Female) Treating RN: [name] Primary Care Physician: Felix Pacini Other Clinician: Referring Physician: Felix Pacini Treating Physician/Extender: Rudene Re in Treatment: 15 Information Obtained from: Patient Chief Complaint Patient presents for treatment of an open ulcer due to venous insufficiency. this 29 year old patient has had an ulcer on the lateral part of her right lower extremity and this has been there for 2 weeks. Electronic Signature(s) Signed: 01/06/2015 9:54:29 AM By: Evlyn Kanner MD, FACS Entered By: Evlyn Kanner on 01/06/2015 09:54:29 Stockert, [name] -------------------------------------------------------------------------------- HPI Details Patient Name: [name], [name]. Date of Service: 01/06/2015 9:15 AM Medical Record Number: [id number] Patient Account Number: [id number] Date of Birth/Sex: 03/15/1986 (28 y.o. Female) Treating RN: [name] Primary Care Physician: Felix Pacini Other Clinician: Referring Physician: Felix Pacini Treating Physician/Extender: Rudene Re in Treatment: 15 History of Present Illness Location: the lateral part of her right ankle Quality: Patient reports experiencing a dull pain to affected area(s). Severity: Patient states wound are getting worse. Duration: Patient has had the wound for < 2 weeks prior to presenting for treatment Timing: Pain in wound is Intermittent (comes and goes Context: The wound appeared gradually over time Modifying Factors: Consults to this date include:seen in the ER and asked to follow up with wound care. Associated Signs and Symptoms: Patient reports having difficulty standing for long periods. HPI  Description: 29 year old patient who started with having ulcerations on the right lower leg on the lateral part of her ankle for about 2 weeks. She was seen in the ER at [facility] and advised to see the wound care for a consultation. No X-rays of workup was done during the ER visit and no prescription for any medications of compression wraps were given. the patient is not diabetic but does have hypertension and her medications have been reviewed by me. 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 The right and left greater saphenous vein was found to be tortuous. Deep venous system was also not competent and there was reflux of more than 500 ms. She was then seen by Dr. Tawanna Cooler Early who recommended that the patient would not benefit from endovenous ablation and he had recommended vein stripping on the right side and multiple small phlebectomy procedures on the left side. the patient did not follow-up due to social economic reasons. She has not been wearing any compression stockings and has not taken any specific treatment for varicose veins for the last 3 years. 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 We have obtained some appointments to see the vascular surgeons by the end of the month and the patient would like to follow up with me at my [facility] on Wednesday, June 29. 10/14/2014 -- she could not see me yesterday in [place] and hence has come for a review today. She has a vascular workup to be done this afternoon at [facility]. She is doing fine otherwise. 10/22/2014 -- she was seen by Dr. [name] and he has recommended surgical removal of her right saphenous vein  from distal thigh to saphenofemoral junction  and stab phlebectomy's of multiple large tributary branches throughout her thigh and calf. This would be done under general anesthesia in the outpatient setting. 10/29/2014 -- she is trying to work on a surgical date and in the meanwhile we have got insurance clearance for Apligraf and we will start this next week. 7/22 2016 -- she is here for the first application of Apligraf. LAYCI, STENGLEIN ([id number]) 11/19/2014 -- she is here for a second application of Apligraf 11/26/2014 -- she has done fine after her last application of Apligraf and is awaiting her surgery which is scheduled for August 31. 12/03/2014 -- she is doing fine and is here for her third application of Apligraf. 12/21/2014 -- She had surgery on 12/15/2014 by Dr.Early who did #1 ligation and stripping of right great saphenous vein from distal thigh to saphenofemoral junction, #2 stab phlebectomy of large tributary varicose veins in the thigh popliteal space and calf. She had an Ace wrap up to her groin and this was removed today and the Unna's boot was also removed. 12/28/2014 -- she is here for her fourth application of Apligraf. 01/06/2015 - he saw her vascular surgeon Dr. Arbie Cookey who was pleased with her progress and he has confirmed that no surgical procedures could be attempted on the left side. Electronic Signature(s) Signed: 01/06/2015 9:55:03 AM By: Evlyn Kanner MD, FACS Entered By: Evlyn Kanner on 01/06/2015 09:55:03 Dvorsky, [name] -------------------------------------------------------------------------------- Physical Exam Details Patient Name: [name], [name]. Date of Service: 01/06/2015 9:15 AM Medical Record Number: [id number] Patient Account Number: [id number] Date of Birth/Sex: 01-31-1986 (28 y.o. Female) Treating RN: [name] Primary Care Physician: Felix Pacini Other Clinician: Referring Physician: Felix Pacini Treating Physician/Extender: Rudene Re in Treatment:  15 Constitutional . Pulse regular. Respirations normal and unlabored. Afebrile. . Eyes Nonicteric. Reactive to light. Ears, Nose, Mouth, and Throat Lips, teeth, and gums WNL.Marland Kitchen Moist mucosa without lesions . Neck supple and nontender. No palpable supraclavicular or cervical adenopathy. Normal sized without goiter. Respiratory WNL. No retractions.. Cardiovascular Pedal Pulses WNL. No clubbing, cyanosis or edema. Chest Breasts symmetical and no nipple discharge.. Breast tissue WNL, no masses, lumps, or tenderness.. Lymphatic No adneopathy. No adenopathy. No adenopathy. Musculoskeletal Adexa without tenderness or enlargement.. Digits and nails w/o clubbing, cyanosis, infection, petechiae, ischemia, or inflammatory conditions.. Integumentary (Hair, Skin) No suspicious lesions. No crepitus or fluctuance. No peri-wound warmth or erythema. No masses.Marland Kitchen Psychiatric Judgement and insight Intact.. No evidence of depression, anxiety, or agitation.. Notes The wound on the right lateral ankle has the Mepitel and the Apligraf in place but the rest of the leg looks good and we will continue with a bolster and an Unna's boot this week. Electronic Signature(s) Signed: 01/06/2015 9:55:50 AM By: Evlyn Kanner MD, FACS Entered By: Evlyn Kanner on 01/06/2015 09:55:50 Rigdon, [name] ([id number]) -------------------------------------------------------------------------------- Physician Orders Details Patient Name: [name], [name]. Date of Service: 01/06/2015 9:15 AM Medical Record Number: [id number] Patient Account Number: [id number] Date of Birth/Sex: 1985-11-30 (28 y.o. Female) Treating RN: [name] Primary Care Physician: Felix Pacini Other Clinician: Referring Physician: Felix Pacini Treating Physician/Extender: Rudene Re in Treatment: 15 Verbal / Phone Orders: Yes Clinician: [name] Read Back and Verified: Yes Diagnosis Coding Wound Cleansing Wound #1 Right,Lateral  Malleolus o Cleanse wound with mild soap and water Skin Barriers/Peri-Wound Care Wound #1 Right,Lateral Malleolus o Moisturizing lotion Primary Wound Dressing Wound #1 Right,Lateral Malleolus o ABD Pad Dressing Change Frequency Wound #1 Right,Lateral Malleolus o  Change dressing every week Follow-up Appointments Wound #1 Right,Lateral Malleolus o Return Appointment in 1 week. Edema Control Wound #1 Right,Lateral Malleolus o Unna Boot to Right Lower Extremity Additional Orders / Instructions Wound #1 Right,Lateral Malleolus o Increase protein intake. Electronic Signature(s) Signed: 01/06/2015 3:44:09 PM By: Evlyn Kanner MD, FACS Signed: 01/06/2015 6:06:48 PM By: Elliot Gurney RN, [name], [name], [name] Entered By: Elliot Gurney, RN, [name], [place] on 01/06/2015 09:47:03 Sturdy, [name] ([id number]) MIALANI, REICKS ([id number]) -------------------------------------------------------------------------------- Problem List Details Patient Name: [name], [name]. Date of Service: 01/06/2015 9:15 AM Medical Record Number: [id number] Patient Account Number: [id number] Date of Birth/Sex: Aug 29, 1985 (28 y.o. 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 Female) Treating RN: [name] Primary Care Physician: Felix Pacini Other Clinician: Referring Physician: Felix Pacini Treating Physician/Extender: Rudene Re in Treatment: 15 Subjective Chief Complaint Information obtained from Patient Patient presents for treatment of an open ulcer due to venous insufficiency. this 29 year old patient has had an ulcer on the lateral part of her right lower extremity and this has been there for 2 weeks. History of Present Illness (HPI) The following HPI elements were documented for the patient's wound: Location: the lateral part of her right ankle Quality: Patient reports experiencing a dull pain to affected area(s). Severity: Patient states wound are getting worse. Duration: Patient has had the wound for < 2 weeks prior to presenting for treatment Timing: Pain in wound is Intermittent (comes and goes Context: The wound appeared gradually over time Modifying Factors: Consults to this date include:seen in the ER and asked to follow up with wound care. Associated Signs and Symptoms: Patient reports having difficulty standing for long periods. 29 year old patient who started with having ulcerations on the right lower leg on the lateral part of her ankle for about 2 weeks. She was seen in the ER at [facility] and advised to see the wound care for a consultation. No X-rays of workup was done during the ER visit and no prescription for any medications of compression wraps were given. the patient is not diabetic but does have hypertension and her medications have been reviewed by me. 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 The right and left greater saphenous vein was found to be tortuous. Deep venous system was also not competent and there was reflux of more than 500 ms. She was then seen by Dr. Tawanna Cooler Early who recommended that the patient would not benefit from endovenous ablation and he had recommended vein stripping on the right side and multiple small phlebectomy procedures on the left side. the patient did not follow-up due to social economic reasons. She has not been  wearing any compression stockings and has not taken any specific treatment for varicose veins for the last 3 years. 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 We have obtained some appointments to see the vascular surgeons by the end of the month and the patient would like to follow up with me at my [facility] on Wednesday, June 29. 10/14/2014 -- she could not see me yesterday in [place] and hence has come for a review today. She [name], [name] ([id number]) has a vascular workup to be done this afternoon at [facility]. She is doing fine otherwise. 10/22/2014 -- she was seen by Dr. [name] and he has recommended surgical removal of her right saphenous vein from distal thigh to saphenofemoral junction and stab phlebectomy's of multiple large tributary branches throughout her thigh and calf. This would be done under general anesthesia in the outpatient setting. 10/29/2014 -- she is trying to work on a surgical date and in the meanwhile we have got insurance clearance for Apligraf and we will start this next week. 7/22 2016 -- she is here for the first application of Apligraf. 11/19/2014 -- she is here for a second application of Apligraf 11/26/2014 -- she has done fine after her last application of Apligraf and is awaiting her surgery which is scheduled for August 31. 12/03/2014 -- she is doing fine  and is here for her third application of Apligraf. 12/21/2014 -- She had surgery on 12/15/2014 by Dr.Early who did #1 ligation and stripping of right great saphenous vein from distal thigh to saphenofemoral junction, #2 stab phlebectomy of large tributary varicose veins in the thigh popliteal space and calf. She had an Ace wrap up to her groin and this was removed today and the Unna's boot was also removed. 12/28/2014 -- she is here for her fourth application of Apligraf. 01/06/2015 - he saw her vascular surgeon Dr. Arbie Cookey who was pleased with her progress and he has confirmed that no surgical procedures could be attempted on the left side. Objective Constitutional Pulse regular. Respirations normal and unlabored. Afebrile. Vitals Time Taken: 9:31 AM, Height: 66 in, Weight: 350.3 lbs, BMI: 56.5, Temperature: 98.4 F, Pulse: 89 bpm, Respiratory Rate: 18 breaths/min, Blood Pressure: 120/67 mmHg. Eyes Nonicteric. Reactive to light. Ears, Nose, Mouth, and Throat Lips, teeth, and gums WNL.Marland Kitchen Moist mucosa without lesions . Neck supple and nontender. No palpable supraclavicular or cervical adenopathy. Normal sized without goiter. Respiratory WNL. No retractions.. Cardiovascular Russell, Toi J. ([id number]) Pedal Pulses WNL. No clubbing, cyanosis or edema. Chest Breasts symmetical and no nipple discharge.. Breast tissue WNL, no masses, lumps, or tenderness.. Lymphatic No adneopathy. No adenopathy. No adenopathy. Musculoskeletal Adexa without tenderness or enlargement.. Digits and nails w/o clubbing, cyanosis, infection, petechiae, ischemia, or inflammatory conditions.Marland Kitchen Psychiatric Judgement and insight Intact.. No evidence of depression, anxiety, or agitation.. General Notes: The wound on the right lateral ankle has the Mepitel and the Apligraf in place but the rest of the leg looks good and we will continue with a bolster and an Unna's boot this week. Integumentary (Hair, Skin) No  suspicious lesions. No crepitus or fluctuance. No peri-wound warmth or erythema. No masses.. Wound #1 status is Open. Original cause of wound was Not Known. The wound is located on the Right,Lateral Malleolus. The wound measures 0.5cm length x 1.2cm width x 0.1cm depth; 0.471cm^2 area and 0.047cm^3 volume. The wound is limited to skin breakdown. There  is a medium amount of serosanguineous drainage noted. The wound margin is distinct with the outline attached to the wound base. There is large (67-100%) granulation within the wound bed. There is a small (1-33%) amount of necrotic tissue within the wound bed including Adherent Slough. The periwound skin appearance exhibited: Localized Edema, Moist. The periwound skin appearance did not exhibit: Callus, Crepitus, Excoriation, Fluctuance, Friable, Induration, Rash, Scarring, Dry/Scaly, Maceration, Atrophie Blanche, Cyanosis, Ecchymosis, Hemosiderin Staining, Mottled, Pallor, Rubor, Erythema. Periwound temperature was noted as No Abnormality. The periwound has tenderness on palpation. General Notes: Apligraf recheck- measurements used from last visit. Dressing Not removed, unable to visualize wound bed. 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 ([id number]) I have recommended we continue with own Unna's boot this week and next week she will be here for her fifth application of Apligraf. We have also ordered her compression stockings and she will need them for her left leg which has varicose veins and also for her right leg post surgery. Plan Wound Cleansing: Wound #1 Right,Lateral Malleolus: Cleanse wound with mild soap and water Skin Barriers/Peri-Wound Care: Wound #1 Right,Lateral Malleolus: Moisturizing lotion Primary Wound  Dressing: Wound #1 Right,Lateral Malleolus: ABD Pad Dressing Change Frequency: Wound #1 Right,Lateral Malleolus: Change dressing every week Follow-up Appointments: Wound #1 Right,Lateral Malleolus: Return Appointment in 1 week. Edema Control: Wound #1 Right,Lateral Malleolus: Unna Boot to Right Lower Extremity Additional Orders / Instructions: Wound #1 Right,Lateral Malleolus: Increase protein intake. I have recommended we continue with own Unna's boot this week and next week she will be here for her fifth application of Apligraf. We have also ordered her compression stockings and she will need them for her left leg which has varicose veins and also for her right leg post surgery. Electronic Signature(s) Signed: 01/06/2015 9:56:35 AM By: Evlyn Kanner MD, FACS Staszak, [name] ([id number]) Entered By: Evlyn Kanner on 01/06/2015 09:56:35 Blum, [name] ([id number]) -------------------------------------------------------------------------------- SuperBill Details Patient Name: [name], [name]. Date of Service: 01/06/2015 Medical Record Number: [id number] Patient Account Number: [id number] Date of Birth/Sex: 1985-11-07 (28 y.o. 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 E66.01 Morbid (severe) obesity due to excess calories Facility Procedures CPT4 Code: [id number] Description: (Facility Use Only) 29518AC - APPLY Roland Rack BOOT RT Modifier: Quantity: 1 Physician Procedures CPT4 Code Description: 1660630 16010 - WC PHYS LEVEL 3 - EST PT ICD-10 Description Diagnosis I83.013 Varicose veins of right lower extremity with ulcer L97.312 Non-pressure chronic  ulcer of right ankle with fat E66.01 Morbid (severe) obesity due to  excess calories Modifier: of ankle layer expose Quantity: 1 d Electronic Signature(s) Signed: 01/06/2015 9:56:50 AM By: Evlyn Kanner MD, FACS Entered By: Evlyn Kanner on 01/06/2015 09:56:50

## 2015-01-13 ENCOUNTER — Encounter: Payer: BLUE CROSS/BLUE SHIELD | Admitting: Surgery

## 2015-01-13 DIAGNOSIS — L97312 Non-pressure chronic ulcer of right ankle with fat layer exposed: Secondary | ICD-10-CM | POA: Diagnosis not present

## 2015-01-13 NOTE — Progress Notes (Signed)
[name], [name] ([id number]) Visit Report for 01/13/2015 Arrival Information Details Patient Name: [name], [name]. Date of Service: 01/13/2015 8:00 AM Medical Record Number: [id number] Patient Account Number: [id number] Date of Birth/Sex: 12-17-1985 (28 y.o. Female) Treating RN: [name], RN, [name], [place] Sink Primary Care Physician: Felix Pacini Other Clinician: Referring Physician: Felix Pacini Treating Physician/Extender: Rudene Re in Treatment: 16 Visit Information History Since Last Visit Added or deleted any medications: No Patient Arrived: Ambulatory Any new allergies or adverse reactions: No Arrival Time: 08:16 Had a fall or experienced change in No Accompanied By: self activities of daily living that may affect Transfer Assistance: None risk of falls: Patient Identification Verified: Yes Signs or symptoms of abuse/neglect since last No Secondary Verification Process Yes visito Completed: Hospitalized since last visit: No Patient Has Alerts: Yes Has Dressing in Place as Prescribed: Yes Patient Alerts: 09/2013 ABI R: Has Compression in Place as Prescribed: Yes 1.3 Pain Present Now: No Electronic Signature(s) Signed: 01/13/2015 8:19:25 AM By: Elpidio Eric [name], RN Entered By: Elpidio Eric on 01/13/2015 08:19:25 [place], [name] ([id number]) -------------------------------------------------------------------------------- Encounter Discharge Information Details Patient Name: [name], [name]. Date of Service: 01/13/2015 8:00 AM Medical Record Number: [id number] Patient Account Number: [id number] Date of Birth/Sex: 11/24/1985 (28 y.o. Female) Treating RN: [name], RN, [name], [place] Sink Primary Care Physician: Felix Pacini Other Clinician: Referring Physician: Felix Pacini Treating Physician/Extender: Rudene Re in Treatment: 16 Encounter Discharge Information Items Discharge Pain Level: 0 Discharge Condition: Stable Ambulatory Status: Ambulatory Discharge  Destination: Home Transportation: Private Auto Accompanied By: self Schedule Follow-up Appointment: No Medication Reconciliation completed and provided to Patient/Care No Provider: Provided on Clinical Summary of Care: 01/13/2015 Form Type Recipient Paper Patient SS Electronic Signature(s) Signed: 01/13/2015 8:53:01 AM By: Elpidio Eric [name], RN Previous Signature: 01/13/2015 8:47:11 AM Version By: Gwenlyn Perking Entered By: Elpidio Eric on 01/13/2015 08:53:01 [place], [name] ([id number]) -------------------------------------------------------------------------------- Lower Extremity Assessment Details Patient Name: [name], [name] Date of Service: 01/13/2015 8:00 AM Medical Record Number: [id number] Patient Account Number: [id number] Date of Birth/Sex: [birth year] (28 y.o. Female) Treating RN: [name], RN, [name], [place] Sink Primary Care Physician: Felix Pacini Other Clinician: Referring Physician: KUNEFF, RENEE Treating Physician/Extender: Rudene Re in Treatment: 16 Edema Assessment Assessed: [Left: No] [Right: No] E[Left: dema] [Right: :] Calf Left: Right: Point of Measurement: 34 cm From Medial Instep cm 49.6 cm Ankle Left: Right: Point of Measurement: 14 cm From Medial Instep cm 28.6 cm Vascular Assessment Claudication: Claudication Assessment [Right:None] Pulses: Posterior Tibial Dorsalis Pedis Palpable: [Right:Yes] Extremity colors, hair growth, and conditions: Extremity Color: [Right:Dusky] Hair Growth on Extremity: [Right:No] Temperature of Extremity: [Right:Warm] Capillary Refill: [Right:< 3 seconds] Toe Nail Assessment Left: Right: Thick: No Discolored: No Deformed: No Improper Length and Hygiene: No Electronic Signature(s) Signed: 01/13/2015 8:20:22 AM By: Elpidio Eric [name], RN Entered By: Elpidio Eric on 01/13/2015 08:20:22 [place], [name] ([id number]) [place], [name]  ([id number]) -------------------------------------------------------------------------------- Multi Wound Chart Details Patient Name: [name], [name]. Date of Service: 01/13/2015 8:00 AM Medical Record Number: [id number] Patient Account Number: [id number] Date of Birth/Sex: 1986/03/24 (28 y.o. Female) Treating RN: [name], RN, [name], [place] Sink Primary Care Physician: Felix Pacini Other Clinician: Referring Physician: KUNEFF, RENEE Treating Physician/Extender: Rudene Re in Treatment: 16 Vital Signs Height(in): 66 Pulse(bpm): 88 Weight(lbs): 350.3 Blood Pressure 129/75 (mmHg): Body Mass Index(BMI): 57 Temperature(F): 98.3 Respiratory Rate 16 (breaths/min): Photos: [1:No Photos] [N/A:N/A] Wound Location: [1:Right Malleolus - Lateral] [N/A:N/A] Wounding Event: [1:Not Known] [N/A:N/A] Primary Etiology: [1:Venous Leg Ulcer] [N/A:N/A] Date Acquired: [1:09/06/2014] [N/A:N/A] Weeks  of Treatment: [1:16] [N/A:N/A] Wound Status: [1:Open] [N/A:N/A] Measurements [name] x W x D 0.2x0.2x0.1 [N/A:N/A] (cm) Area (cm) : [1:0.031] [N/A:N/A] Volume (cm) : [1:0.003] [N/A:N/A] % Reduction in Area: [1:98.60%] [N/A:N/A] % Reduction in Volume: 99.50% [N/A:N/A] Classification: [1:Full Thickness Without Exposed Support Structures] [N/A:N/A] Exudate Amount: [1:Small] [N/A:N/A] Exudate Type: [1:Serosanguineous] [N/A:N/A] Exudate Color: [1:red, brown] [N/A:N/A] Wound Margin: [1:Distinct, outline attached] [N/A:N/A] Granulation Amount: [1:Large (67-100%)] [N/A:N/A] Necrotic Amount: [1:None Present (0%)] [N/A:N/A] Exposed Structures: [1:Fascia: No Fat: No Tendon: No Muscle: No Joint: No Bone: No Limited to Skin Breakdown] [N/A:N/A] Epithelialization: Large (67-100%) N/A N/A Periwound Skin Texture: Edema: Yes N/A N/A Excoriation: No Induration: No Callus: No Crepitus: No Fluctuance: No Friable: No Rash: No Scarring: No Periwound Skin Moist: Yes N/A N/A Moisture: Maceration: No Dry/Scaly:  No Periwound Skin Color: Hemosiderin Staining: Yes N/A N/A Atrophie Blanche: No Cyanosis: No Ecchymosis: No Erythema: No Mottled: No Pallor: No Rubor: No Temperature: No Abnormality N/A N/A Tenderness on No N/A N/A Palpation: Wound Preparation: Ulcer Cleansing: Other: N/A N/A soap and water Topical Anesthetic Applied: None Treatment Notes Electronic Signature(s) Signed: 01/13/2015 8:35:50 AM By: Elpidio Eric [name], RN Entered By: Elpidio Eric on 01/13/2015 08:35:50 [place], [name] ([id number]) -------------------------------------------------------------------------------- Multi-Disciplinary Care Plan Details Patient Name: [name], [name]. Date of Service: 01/13/2015 8:00 AM Medical Record Number: [id number] Patient Account Number: [id number] Date of Birth/Sex: 1985/10/07 (28 y.o. Female) Treating RN: [name], RN, [name], [place] Sink Primary Care Physician: Felix Pacini Other Clinician: Referring Physician: Felix Pacini Treating Physician/Extender: Rudene Re in Treatment: 16 Active Inactive Abuse / Safety / Falls / Self Care Management Nursing Diagnoses: Potential for falls Goals: Patient will remain injury free Date Initiated: 09/20/2014 Goal Status: Active Interventions: Assess fall risk on admission and as needed Notes: Orientation to the Wound Care Program Nursing Diagnoses: Knowledge deficit related to the wound healing center program Goals: Patient/caregiver will verbalize understanding of the Wound Healing Center Program Date Initiated: 09/20/2014 Goal Status: Active Interventions: Provide education on orientation to the wound center Notes: Pain, Acute or Chronic Nursing Diagnoses: Pain Management - Cyclic Acute (Dressing Change Related) Goals: Patient will verbalize adequate pain control and receive pain control interventions during procedures as needed VUNG, KUSH ([id number]) Date Initiated: 09/20/2014 Goal Status: Active Interventions: Complete  pain assessment as per visit requirements Notes: Venous Leg Ulcer Nursing Diagnoses: Potential for venous Insuffiency (use before diagnosis confirmed) Goals: Non-invasive venous studies are completed as ordered Date Initiated: 09/20/2014 Goal Status: Active Interventions: Assess peripheral edema status every visit. Notes: Wound/Skin Impairment Nursing Diagnoses: Impaired tissue integrity Goals: Ulcer/skin breakdown will have a volume reduction of 30% by week 4 Date Initiated: 09/20/2014 Goal Status: Active Interventions: Assess ulceration(s) every visit Notes: Electronic Signature(s) Signed: 01/13/2015 8:35:39 AM By: Elpidio Eric [name], RN Entered By: Elpidio Eric on 01/13/2015 08:35:39 [name], [name] ([id number]) -------------------------------------------------------------------------------- Pain Assessment Details Patient Name: [name], [name]. Date of Service: 01/13/2015 8:00 AM Medical Record Number: [id number] Patient Account Number: [id number] Date of Birth/Sex: [birth year] (28 y.o. Female) Treating RN: [name], RN, [name], [place] Sink Primary Care Physician: Felix Pacini Other Clinician: Referring Physician: Felix Pacini Treating Physician/Extender: Rudene Re in Treatment: 16 Active Problems Location of Pain Severity and Description of Pain Patient Has Paino No Site Locations Pain Management and Medication Current Pain Management: Electronic Signature(s) Signed: 01/13/2015 8:19:35 AM By: Elpidio Eric [name], RN Entered By: Elpidio Eric on 01/13/2015 08:19:35 [name], [name] ([id number]) -------------------------------------------------------------------------------- Patient/Caregiver Education Details Patient Name: [name], [name] Date of Service: 01/13/2015 8:00 AM  Medical Record Number: [id number] Patient Account Number: [id number] Date of Birth/Gender: 1985/11/01 (28 y.o. Female) Treating RN: [name], RN, [name], [place] Sink Primary Care Physician: Felix Pacini Other  Clinician: Referring Physician: Felix Pacini Treating Physician/Extender: Rudene Re in Treatment: 16 Education Assessment Education Provided To: Patient Education Topics Provided Welcome To The Wound Care Center: Methods: Explain/Verbal Responses: State content correctly Electronic Signature(s) Signed: 01/13/2015 8:53:09 AM By: Elpidio Eric [name], RN Entered By: Elpidio Eric on 01/13/2015 08:53:09 [place], [name] ([id number]) -------------------------------------------------------------------------------- Wound Assessment Details Patient Name: [name], [name]. Date of Service: 01/13/2015 8:00 AM Medical Record Number: [id number] Patient Account Number: [id number] Date of Birth/Sex: April 09, 1986 (28 y.o. Female) Treating RN: [name], RN, [name], Psychologist, clinical Primary Care Physician: Felix Pacini Other Clinician: Referring Physician: KUNEFF, RENEE Treating Physician/Extender: Rudene Re in Treatment: 16 Wound Status Wound Number: 1 Primary Etiology: Venous Leg Ulcer Wound Location: Right Malleolus - Lateral Wound Status: Open Wounding Event: Not Known Date Acquired: 09/06/2014 Weeks Of Treatment: 16 Clustered Wound: No Photos Photo Uploaded By: Elpidio Eric on 01/13/2015 16:35:22 Wound Measurements Length: (cm) 0.2 Width: (cm) 0.2 Depth: (cm) 0.1 Area: (cm) 0.031 Volume: (cm) 0.003 % Reduction in Area: 98.6% % Reduction in Volume: 99.5% Epithelialization: Large (67-100%) Tunneling: No Undermining: No Wound Description Full Thickness Without Exposed Classification: Support Structures Wound Margin: Distinct, outline attached Exudate Small Amount: Exudate Type: Serosanguineous Exudate Color: red, brown Foul Odor After Cleansing: No Wound Bed Granulation Amount: Large (67-100%) Exposed Structure Necrotic Amount: None Present (0%) Fascia Exposed: No Fat Layer Exposed: No Vandehei, [name] ([id number]) Tendon Exposed: No Muscle Exposed: No Joint Exposed:  No Bone Exposed: No Limited to Skin Breakdown Periwound Skin Texture Texture Color No Abnormalities Noted: No No Abnormalities Noted: No Callus: No Atrophie Blanche: No Crepitus: No Cyanosis: No Excoriation: No Ecchymosis: No Fluctuance: No Erythema: No Friable: No Hemosiderin Staining: Yes Induration: No Mottled: No Localized Edema: Yes Pallor: No Rash: No Rubor: No Scarring: No Temperature / Pain Moisture Temperature: No Abnormality No Abnormalities Noted: No Dry / Scaly: No Maceration: No Moist: Yes Wound Preparation Ulcer Cleansing: Other: soap and water, Topical Anesthetic Applied: None Treatment Notes Wound #1 (Right, Lateral Malleolus) 1. Cleansed with: Cleanse wound with antibacterial soap and water 3. Peri-wound Care: Moisturizing lotion 4. Dressing Applied: Mepitel 5. Secondary Dressing Applied Dry Gauze 7. Secured with Henriette Combs to Right Lower Extremity Electronic Signature(s) Signed: 01/13/2015 8:35:29 AM By: Elpidio Eric [name], RN Entered By: Elpidio Eric on 01/13/2015 08:35:29 [name], [name] ([id number]) -------------------------------------------------------------------------------- Vitals Details Patient Name: DAJA, SHUPING Date of Service: 01/13/2015 8:00 AM Medical Record Number: [id number] Patient Account Number: [id number] Date of Birth/Sex: [birth year] (28 y.o. Female) Treating RN: [name], RN, [name], [place] Primary Care Physician: Felix Pacini Other Clinician: Referring Physician: KUNEFF, RENEE Treating Physician/Extender: Rudene Re in Treatment: 16 Vital Signs Time Taken: 08:19 Temperature (F): 98.3 Height (in): 66 Pulse (bpm): 88 Weight (lbs): 350.3 Respiratory Rate (breaths/min): 16 Body Mass Index (BMI): 56.5 Blood Pressure (mmHg): 129/75 Reference Range: 80 - 120 mg / dl Electronic Signature(s) Signed: 01/13/2015 8:23:04 AM By: Elpidio Eric [name], RN Previous Signature: 01/13/2015 8:19:43 AM Version By: Elpidio Eric  [name], RN Entered By: Elpidio Eric on 01/13/2015 08:23:04

## 2015-01-14 NOTE — Progress Notes (Signed)
[name], [name] ([id number]) Visit Report for 01/13/2015 Chief Complaint Document Details Patient Name: [name], [name]. Date of Service: 01/13/2015 8:00 AM Medical Record Patient Account Number: [id number] Number: [name], RN, [name], Treating RN: 07-07-1985 (28 y.o. [place] Sink Date of Birth/Sex: Female) Other Clinician: Primary Care Physician: KUNEFF, RENEE Treating Evlyn Kanner Referring Physician: Felix Pacini Physician/Extender: Weeks in Treatment: 16 Information Obtained from: Patient Chief Complaint Patient presents for treatment of an open ulcer due to venous insufficiency. this 29 year old patient has had an ulcer on the lateral part of her right lower extremity and this has been there for 2 weeks. Electronic Signature(s) Signed: 01/13/2015 8:45:49 AM By: Evlyn Kanner MD, FACS Entered By: Evlyn Kanner on 01/13/2015 08:45:49 [name], [name] ([id number]) -------------------------------------------------------------------------------- HPI Details Patient Name: [name], [name]. Date of Service: 01/13/2015 8:00 AM Medical Record Patient Account Number: [id number] Number: [name], RN, [name], Treating RN: 01-15-1986 (28 y.o. [place] Sink Date of Birth/Sex: Female) Other Clinician: Primary Care Physician: KUNEFF, RENEE Treating Evlyn Kanner Referring Physician: Felix Pacini Physician/Extender: Weeks in Treatment: 16 History of Present Illness Location: the lateral part of her right ankle Quality: Patient reports experiencing a dull pain to affected area(s). Severity: Patient states wound are getting worse. Duration: Patient has had the wound for < 2 weeks prior to presenting for treatment Timing: Pain in wound is Intermittent (comes and goes Context: The wound appeared gradually over time Modifying Factors: Consults to this date include:seen in the ER and asked to follow up with wound care. Associated Signs and Symptoms: Patient reports having difficulty standing for  long periods. HPI Description: 29 year old patient who started with having ulcerations on the right lower leg on the lateral part of her ankle for about 2 weeks. She was seen in the ER at [facility] and advised to see the wound care for a consultation. No X-rays of workup was done during the ER visit and no prescription for any medications of compression wraps were given. the patient is not diabetic but does have hypertension and her medications have been reviewed by me. In July 2013 she was seen by renal and vascular services of 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 The right and left greater saphenous vein was found to be tortuous. Deep venous system was also not competent and there was reflux of more than 500 ms. She was then seen by Dr. Tawanna Cooler Early who recommended that the patient would not benefit from endovenous ablation and he had recommended vein stripping on the right side and multiple small phlebectomy procedures on the left side. the patient did not follow-up due to social economic reasons. She has not been wearing any compression stockings and has not taken any specific treatment for varicose veins for the last 3 years. 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 We have obtained some appointments to see the vascular surgeons by the end of the month and the patient would like to follow up with me at my [facility] on Wednesday, June 29. 10/14/2014 -- she could not see me yesterday in [place] and hence has come for a review today. She has a vascular workup to be done this afternoon at [facility]. She is doing fine otherwise. 10/22/2014 -- she was seen by Dr. [name] and he has recommended surgical removal of  her right saphenous vein from distal thigh to  saphenofemoral junction and stab phlebectomy's of multiple large tributary branches throughout her thigh and calf. This would be done under general anesthesia in the outpatient setting. 10/29/2014 -- she is trying to work on a surgical date and in the meanwhile we have got insurance Laforte, Bancroft J. ([id number]) clearance for Apligraf and we will start this next week. 7/22 2016 -- she is here for the first application of Apligraf. 11/19/2014 -- she is here for a second application of Apligraf 11/26/2014 -- she has done fine after her last application of Apligraf and is awaiting her surgery which is scheduled for August 31. 12/03/2014 -- she is doing fine and is here for her third application of Apligraf. 12/21/2014 -- She had surgery on 12/15/2014 by Dr.Early who did #1 ligation and stripping of right great saphenous vein from distal thigh to saphenofemoral junction, #2 stab phlebectomy of large tributary varicose veins in the thigh popliteal space and calf. She had an Ace wrap up to her groin and this was removed today and the Unna's boot was also removed. 12/28/2014 -- she is here for her fourth application of Apligraf. 01/06/2015 - he saw her vascular surgeon Dr. Arbie Cookey who was pleased with her progress and he has confirmed that no surgical procedures could be attempted on the left side. 01/13/2015 -- her wound looks very good and she's been having no problems whatsoever. Electronic Signature(s) Signed: 01/13/2015 8:46:19 AM By: Evlyn Kanner MD, FACS Entered By: Evlyn Kanner on 01/13/2015 08:46:18 [name], [name] ([id number]) -------------------------------------------------------------------------------- Physical Exam Details Patient Name: [name], [name]. Date of Service: 01/13/2015 8:00 AM Medical Record Patient Account Number: [id number] Number: [name], RN, [name], Treating RN: [birth year] (28 y.o. [place] Sink Date of Birth/Sex: Female) Other Clinician: Primary Care Physician:  KUNEFF, RENEE Treating Britto, Errol Referring Physician: KUNEFF, RENEE Physician/Extender: Weeks in Treatment: 16 Constitutional . Pulse regular. Respirations normal and unlabored. Afebrile. . Eyes Nonicteric. Reactive to light. Ears, Nose, Mouth, and Throat Lips, teeth, and gums WNL.Marland Kitchen Moist mucosa without lesions . Neck supple and nontender. No palpable supraclavicular or cervical adenopathy. Normal sized without goiter. Respiratory WNL. No retractions.. Breath sounds WNL, No rubs, rales, rhonchi, or wheeze.. Cardiovascular Heart rhythm and rate regular, no murmur or gallop.. Pedal Pulses WNL. No clubbing, cyanosis or edema. Lymphatic No adneopathy. No adenopathy. No adenopathy. Musculoskeletal Adexa without tenderness or enlargement.. Digits and nails w/o clubbing, cyanosis, infection, petechiae, ischemia, or inflammatory conditions.. Integumentary (Hair, Skin) No suspicious lesions. No crepitus or fluctuance. No peri-wound warmth or erythema. No masses.Marland Kitchen Psychiatric Judgement and insight Intact.. No evidence of depression, anxiety, or agitation.. Notes The wound on the right lateral ankle looks excellent and is tiny and there is some epithelization over it. Electronic Signature(s) Signed: 01/13/2015 8:46:47 AM By: Evlyn Kanner MD, FACS Entered By: Evlyn Kanner on 01/13/2015 08:46:47 Rueda, [name] ([id number]) -------------------------------------------------------------------------------- Physician Orders Details Patient Name: [name], [name]. Date of Service: 01/13/2015 8:00 AM Medical Record Patient Account Number: [id number] Number: [name], RN, [name], Treating RN: 10-30-1985 (28 y.o. [place] Sink Date of Birth/Sex: Female) Other Clinician: Primary Care Physician: KUNEFF, RENEE Treating Britto, Errol Referring Physician: Felix Pacini Physician/Extender: Weeks in Treatment: 45 Verbal / Phone Orders: Yes Clinician: [name], RN, [name], Rita Read Back and Verified:  Yes Diagnosis Coding Wound Cleansing Wound #1 Right,Lateral Malleolus o May shower with protection. Skin Barriers/Peri-Wound Care Wound #1 Right,Lateral Malleolus o Moisturizing lotion Primary Wound Dressing Wound #1 Right,Lateral Malleolus o Mepitel One Secondary  Dressing Wound #1 Right,Lateral Malleolus o Dry Gauze Dressing Change Frequency Wound #1 Right,Lateral Malleolus o Change dressing every week Follow-up Appointments Wound #1 Right,Lateral Malleolus o Return Appointment in 1 week. Edema Control Wound #1 Right,Lateral Malleolus o Unna Boot to Right Lower Extremity Electronic Signature(s) Signed: 01/13/2015 8:47:01 AM By: Elpidio Eric [name], RN Signed: 01/13/2015 5:16:51 PM By: Evlyn Kanner MD, FACS Previous Signature: 01/13/2015 8:37:02 AM Version By: Elpidio Eric [name], RN [address] [name] ([id number]) Entered By: Elpidio Eric on 01/13/2015 08:47:01 Symons, [name] ([id number]) -------------------------------------------------------------------------------- Problem List Details Patient Name: [name], [name]. Date of Service: 01/13/2015 8:00 AM Medical Record Patient Account Number: [id number] Number: [name], RN, [name], Treating RN: 28-Feb-1986 (28 y.o. 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 [place] Sink Date of Birth/Sex: Female) Other Clinician: Primary Care Physician: KUNEFF, RENEE Treating Evlyn Kanner Referring Physician: Felix Pacini Physician/Extender: Weeks in Treatment: 16 Subjective Chief Complaint Information obtained from Patient Patient presents for treatment of an open ulcer due to venous insufficiency. this 29 year old patient has had an ulcer on the lateral part of her right lower extremity and this has been there for 2 weeks. History of Present Illness (HPI) The following HPI elements were documented for the patient's wound: Location: the lateral part of her right ankle Quality: Patient reports experiencing a dull pain to affected area(s). Severity: Patient states wound are getting worse. Duration: Patient has had the wound for < 2 weeks prior to presenting for treatment Timing: Pain in wound is Intermittent (comes and goes Context: The wound appeared gradually over time Modifying Factors: Consults to this date include:seen in the ER and asked to follow up with wound care. Associated Signs and Symptoms: Patient reports having difficulty standing for long periods. 29 year old patient who started with having ulcerations on the right lower leg on the lateral part of her ankle for about 2 weeks. She was seen in the ER at [facility] and advised to see the wound care for a consultation. No X-rays of workup was done during the ER visit and no prescription for any medications of compression wraps were given. the patient is not diabetic but does have hypertension and her medications have been reviewed by me. 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 The right and left greater saphenous vein was found to be tortuous. Deep venous system was also not competent and there was reflux of more than 500 ms. She was then seen by Dr. Tawanna Cooler Early who recommended that the patient would not benefit from endovenous ablation and he had recommended vein stripping on the right side and multiple small phlebectomy procedures on the left  side. the patient did not follow-up due to social economic reasons. She has not been wearing any compression stockings and has not taken any specific treatment for varicose veins for the last 3 years. 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 We have obtained some appointments to see the vascular surgeons by the end of the month and the patient would like to follow up with me at my [facility] on Wednesday, June 29. [name], [name] ([id number]) 10/14/2014 -- she could not see me yesterday in [place] and hence has come for a review today. She has a vascular workup to be done this afternoon at [facility]. She is doing fine otherwise. 10/22/2014 -- she was seen by Dr. [name] and he has recommended surgical removal of her right saphenous vein from distal thigh to saphenofemoral junction and stab phlebectomy's of multiple large tributary branches throughout her thigh and calf. This would be done under general anesthesia in the outpatient setting. 10/29/2014 -- she is trying to work on a surgical date and in the meanwhile we have got insurance clearance for Apligraf and we will start this next week. 7/22 2016 -- she is here for the first application of Apligraf. 11/19/2014 -- she is here for a second application of Apligraf 11/26/2014 -- she has done fine after her  last application of Apligraf and is awaiting her surgery which is scheduled for August 31. 12/03/2014 -- she is doing fine and is here for her third application of Apligraf. 12/21/2014 -- She had surgery on 12/15/2014 by Dr.Early who did #1 ligation and stripping of right great saphenous vein from distal thigh to saphenofemoral junction, #2 stab phlebectomy of large tributary varicose veins in the thigh popliteal space and calf. She had an Ace wrap up to her groin and this was removed today and the Unna's boot was also removed. 12/28/2014 -- she is here for her fourth application of Apligraf. 01/06/2015 - he saw her vascular surgeon Dr. Arbie Cookey who was pleased with her progress and he has confirmed that no surgical procedures could be attempted on the left side. 01/13/2015 -- her wound looks very good and she's been having no problems whatsoever. Objective Constitutional Pulse regular. Respirations normal and unlabored. Afebrile. Vitals Time Taken: 8:19 AM, Height: 66 in, Weight: 350.3 lbs, BMI: 56.5, Temperature: 98.3 F, Pulse: 88 bpm, Respiratory Rate: 16 breaths/min, Blood Pressure: 129/75 mmHg. Eyes Nonicteric. Reactive to light. Ears, Nose, Mouth, and Throat Lips, teeth, and gums WNL.Marland Kitchen Moist mucosa without lesions . Neck supple and nontender. No palpable supraclavicular or cervical adenopathy. Normal sized without goiter. Respiratory [name], [name] ([id number]) WNL. No retractions.. Breath sounds WNL, No rubs, rales, rhonchi, or wheeze.. Cardiovascular Heart rhythm and rate regular, no murmur or gallop.. Pedal Pulses WNL. No clubbing, cyanosis or edema. Lymphatic No adneopathy. No adenopathy. No adenopathy. Musculoskeletal Adexa without tenderness or enlargement.. Digits and nails w/o clubbing, cyanosis, infection, petechiae, ischemia, or inflammatory conditions.Marland Kitchen Psychiatric Judgement and insight Intact.. No evidence of depression, anxiety, or agitation.. General Notes: The  wound on the right lateral ankle looks excellent and is tiny and there is some epithelization over it. Integumentary (Hair, Skin) No suspicious lesions. No crepitus or fluctuance. No peri-wound warmth or erythema. No masses.. Wound #1 status is Open. Original cause of wound was Not Known. The wound is located on the Right,Lateral Malleolus. The wound measures 0.2cm length x 0.2cm width x  0.1cm depth; 0.031cm^2 area and 0.003cm^3 volume. The wound is limited to skin breakdown. There is no tunneling or undermining noted. There is a small amount of serosanguineous drainage noted. The wound margin is distinct with the outline attached to the wound base. There is large (67-100%) granulation within the wound bed. There is no necrotic tissue within the wound bed. The periwound skin appearance exhibited: Localized Edema, Moist, Hemosiderin Staining. The periwound skin appearance did not exhibit: Callus, Crepitus, Excoriation, Fluctuance, Friable, Induration, Rash, Scarring, Dry/Scaly, Maceration, Atrophie Blanche, Cyanosis, Ecchymosis, Mottled, Pallor, Rubor, Erythema. Periwound temperature was noted as No Abnormality. 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 [name], [name] ([id number]) I have recommended a piece of Mepitel bolster over the ankle and the woman is brought. She will bring her compression stockings with her next week and hopefully we can send her home with these and Lantus patient may be discharged soon. Plan Wound Cleansing: Wound #1 Right,Lateral Malleolus: May shower with protection. Skin Barriers/Peri-Wound Care: Wound #1 Right,Lateral Malleolus: Moisturizing lotion Primary Wound Dressing: Wound #1  Right,Lateral Malleolus: Mepitel One Secondary Dressing: Wound #1 Right,Lateral Malleolus: Dry Gauze Dressing Change Frequency: Wound #1 Right,Lateral Malleolus: Change dressing every week Follow-up Appointments: Wound #1 Right,Lateral Malleolus: Return Appointment in 1 week. Edema Control: Wound #1 Right,Lateral Malleolus: Unna Boot to Right Lower Extremity I do not believe she needs any more Apligraf as the wound is looking excellent. I have recommended a piece of Mepitel bolster over the ankle and the woman is brought. She will bring her compression stockings with her next week and hopefully we can send her home with these and Lantus patient may be discharged soon. Electronic Signature(s) Signed: 01/13/2015 8:47:31 AM By: Evlyn Kanner MD, FACS Entered By: Evlyn Kanner on 01/13/2015 08:47:31 Levengood, [name] ([id number]) -------------------------------------------------------------------------------- SuperBill Details Patient Name: [name], [name] Date of Service: 01/13/2015 Medical Record Patient Account Number: [id number] Number: [name], RN, [name], Treating RN: [birth year] (28 y.o. 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 E66.01 Morbid (severe) obesity due to excess calories Facility Procedures CPT4 Code: [id number] Description: (Facility Use Only) [id number] - APPLY Roland Rack BOOT RT Modifier: Quantity: 1 Physician Procedures CPT4 Code Description: [id number] - WC PHYS LEVEL 3 - EST PT ICD-10 Description Diagnosis I83.013 Varicose veins of right lower extremity with ulcer L97.312 Non-pressure chronic ulcer of right ankle with fat  E66.01 Morbid (severe) obesity due to  excess calories Modifier: of ankle layer exposed Quantity: 1 Electronic Signature(s) Signed: 01/13/2015 8:48:52 AM By: Elpidio Eric [name], RN Signed: 01/13/2015 5:16:51 PM By: Evlyn Kanner MD, FACS Previous Signature: 01/13/2015 8:47:46 AM Version By: Evlyn Kanner MD, FACS Entered By: Elpidio Eric on 01/13/2015 08:48:52

## 2015-01-20 ENCOUNTER — Encounter: Payer: BLUE CROSS/BLUE SHIELD | Attending: General Surgery | Admitting: General Surgery

## 2015-01-20 ENCOUNTER — Encounter: Payer: Self-pay | Admitting: General Surgery

## 2015-01-20 DIAGNOSIS — I87311 Chronic venous hypertension (idiopathic) with ulcer of right lower extremity: Secondary | ICD-10-CM | POA: Diagnosis not present

## 2015-01-20 DIAGNOSIS — I83013 Varicose veins of right lower extremity with ulcer of ankle: Secondary | ICD-10-CM | POA: Insufficient documentation

## 2015-01-20 DIAGNOSIS — L97312 Non-pressure chronic ulcer of right ankle with fat layer exposed: Secondary | ICD-10-CM | POA: Insufficient documentation

## 2015-01-20 DIAGNOSIS — L97919 Non-pressure chronic ulcer of unspecified part of right lower leg with unspecified severity: Secondary | ICD-10-CM

## 2015-01-20 NOTE — Progress Notes (Signed)
seeiheal 

## 2015-01-21 NOTE — Progress Notes (Signed)
BEMNET, TROVATO ([id number]) Visit Report for 01/20/2015 Arrival Information Details Patient Name: [name], [name]. Date of Service: 01/20/2015 10:45 AM Medical Record Number: [id number] Patient Account Number: [id number] Date of Birth/Sex: 1986/03/31 (29 y.o. Female) Treating RN: Curtis Sites Primary Care Physician: Felix Pacini Other Clinician: Referring Physician: Felix Pacini Treating Physician/Extender: Rudene Re in Treatment: 17 Visit Information History Since Last Visit Added or deleted any medications: No Patient Arrived: Ambulatory Any new allergies or adverse reactions: No Arrival Time: 11:01 Had a fall or experienced change in No Accompanied By: self activities of daily living that may affect Transfer Assistance: None risk of falls: Patient Identification Verified: Yes Signs or symptoms of abuse/neglect since last No Secondary Verification Process Yes visito Completed: Hospitalized since last visit: No Patient Has Alerts: Yes Pain Present Now: No Patient Alerts: 09/2013 ABI R: 1.3 Electronic Signature(s) Signed: 01/20/2015 5:11:41 PM By: Ardath Sax MD Entered By: Ardath Sax on 01/20/2015 15:47:19 Bidinger, [name] -------------------------------------------------------------------------------- Clinic Level of Care Assessment Details Patient Name: [name], [name] Date of Service: 01/20/2015 10:45 AM Medical Record Number: [id number] Patient Account Number: [id number] Date of Birth/Sex: [birth year] (29 y.o. Female) Treating RN: Curtis Sites Primary Care Physician: Felix Pacini Other Clinician: Referring Physician: KUNEFF, RENEE Treating Physician/Extender: Rudene Re in Treatment: 17 Clinic Level of Care Assessment Items TOOL 4 Quantity Score  - Use when only an EandM is performed on FOLLOW-UP visit 0 ASSESSMENTS - Nursing Assessment / Reassessment X - Reassessment of Co-morbidities (includes updates in patient  status) 1 10 X - Reassessment of Adherence to Treatment Plan 1 5 ASSESSMENTS - Wound and Skin Assessment / Reassessment X - Simple Wound Assessment / Reassessment - one wound 1 5  - Complex Wound Assessment / Reassessment - multiple wounds 0  - Dermatologic / Skin Assessment (not related to wound area) 0 ASSESSMENTS - Focused Assessment X - Circumferential Edema Measurements - multi extremities 1 5  - Nutritional Assessment / Counseling / Intervention 0 X - Lower Extremity Assessment (monofilament, tuning fork, pulses) 1 5  - Peripheral Arterial Disease Assessment (using hand held doppler) 0 ASSESSMENTS - Ostomy and/or Continence Assessment and Care  - Incontinence Assessment and Management 0  - Ostomy Care Assessment and Management (repouching, etc.) 0 PROCESS - Coordination of Care X - Simple Patient / Family Education for ongoing care 1 15  - Complex (extensive) Patient / Family Education for ongoing care 0  - Staff obtains Chiropractor, Records, Test Results / Process Orders 0  - Staff telephones HHA, Nursing Homes / Clarify orders / etc 0  - Routine Transfer to another Facility (non-emergent condition) 0 [place], [name] ([id number])  - Routine Hospital Admission (non-emergent condition) 0  - New Admissions / Manufacturing engineer / Ordering NPWT, Apligraf, etc. 0  - Emergency Hospital Admission (emergent condition) 0 X - Simple Discharge Coordination 1 10  - Complex (extensive) Discharge Coordination 0 PROCESS - Special Needs  - Pediatric / Minor Patient Management 0  - Isolation Patient Management 0  - Hearing / Language / Visual special needs 0  - Assessment of Community assistance (transportation, D/C planning, etc.) 0  - Additional assistance / Altered mentation 0  - Support Surface(s) Assessment (bed, cushion, seat, etc.) 0 INTERVENTIONS - Wound Cleansing / Measurement X - Simple Wound Cleansing - one wound 1 5  - Complex Wound  Cleansing - multiple wounds 0 X - Wound Imaging (photographs - any number of wounds) 1 5  - Wound Tracing (instead of  photographs) 0 X - Simple Wound Measurement - one wound 1 5  - Complex Wound Measurement - multiple wounds 0 INTERVENTIONS - Wound Dressings X - Small Wound Dressing one or multiple wounds 1 10  - Medium Wound Dressing one or multiple wounds 0  - Large Wound Dressing one or multiple wounds 0  - Application of Medications - topical 0  - Application of Medications - injection 0 INTERVENTIONS - Miscellaneous  - External ear exam 0 Krah, Osceola J. ([id number])  - Specimen Collection (cultures, biopsies, blood, body fluids, etc.) 0  - Specimen(s) / Culture(s) sent or taken to Lab for analysis 0  - Patient Transfer (multiple staff / Michiel Sites Lift / Similar devices) 0  - Simple Staple / Suture removal (25 or less) 0  - Complex Staple / Suture removal (26 or more) 0  - Hypo / Hyperglycemic Management (close monitor of Blood Glucose) 0  - Ankle / Brachial Index (ABI) - do not check if billed separately 0 X - Vital Signs 1 5 Has the patient been seen at the hospital within the last three years: Yes Total Score: 85 Level Of Care: New/Established - Level 3 Electronic Signature(s) Signed: 01/20/2015 4:56:28 PM By: Curtis Sites Entered By: Curtis Sites on 01/20/2015 11:25:56 Woolever, [name] ([id number]) -------------------------------------------------------------------------------- Encounter Discharge Information Details Patient Name: [name], [name]. Date of Service: 01/20/2015 10:45 AM Medical Record Number: [id number] Patient Account Number: [id number] Date of Birth/Sex: 03-02-1986 (29 y.o. Female) Treating RN: Curtis Sites Primary Care Physician: Felix Pacini Other Clinician: Referring Physician: Felix Pacini Treating Physician/Extender: Elayne Snare in Treatment: 17 Encounter Discharge Information Items Discharge Pain Level:  0 Discharge Condition: Stable Ambulatory Status: Ambulatory Discharge Destination: Home Transportation: Private Auto Accompanied By: self Schedule Follow-up Appointment: No Medication Reconciliation completed and provided to Patient/Care No Ereka Brau: Provided on Clinical Summary of Care: 01/20/2015 Form Type Recipient Paper Patient SS Electronic Signature(s) Signed: 01/20/2015 5:11:41 PM By: Ardath Sax MD Previous Signature: 01/20/2015 11:33:10 AM Version By: Gwenlyn Perking Entered By: Ardath Sax on 01/20/2015 15:51:16 Kucinski, [name] ([id number]) -------------------------------------------------------------------------------- Lower Extremity Assessment Details Patient Name: [name], [name]. Date of Service: 01/20/2015 10:45 AM Medical Record Number: [id number] Patient Account Number: [id number] Date of Birth/Sex: 09/04/1985 (28 y.o. Female) Treating RN: Curtis Sites Primary Care Physician: Felix Pacini Other Clinician: Referring Physician: KUNEFF, RENEE Treating Physician/Extender: Rudene Re in Treatment: 17 Edema Assessment Assessed: [Left: No] [Right: No] Edema: [Left: Ye] [Right: s] Calf Left: Right: Point of Measurement: 34 cm From Medial Instep cm 52.2 cm Ankle Left: Right: Point of Measurement: 14 cm From Medial Instep cm 29 cm Vascular Assessment Pulses: Posterior Tibial Dorsalis Pedis Palpable: [Right:Yes] Extremity colors, hair growth, and conditions: Extremity Color: [Right:Hyperpigmented] Hair Growth on Extremity: [Right:No] Temperature of Extremity: [Right:Warm] Capillary Refill: [Right:< 3 seconds] Electronic Signature(s) Signed: 01/20/2015 4:56:28 PM By: Curtis Sites Entered By: Curtis Sites on 01/20/2015 11:11:11 Keast, [name] ([id number]) -------------------------------------------------------------------------------- Multi Wound Chart Details Patient Name: [name], [name] Date of Service: 01/20/2015 10:45 AM Medical  Record Number: [id number] Patient Account Number: [id number] Date of Birth/Sex: June 23, 1985 (28 y.o. Female) Treating RN: Curtis Sites Primary Care Physician: Felix Pacini Other Clinician: Referring Physician: KUNEFF, RENEE Treating Physician/Extender: Rudene Re in Treatment: 17 Vital Signs Height(in): 66 Pulse(bpm): 79 Weight(lbs): 350.3 Blood Pressure 118/70 (mmHg): Body Mass Index(BMI): 57 Temperature(F): 98.1 Respiratory Rate 18 (breaths/min): Photos: [1:No Photos] [N/A:N/A] Wound Location: [1:Right, Lateral Malleolus] [N/A:N/A] Wounding Event: [1:Not Known] [N/A:N/A] Primary Etiology: [1:Venous Leg Ulcer] [N/A:N/A] Date  Acquired: [1:09/06/2014] [N/A:N/A] Weeks of Treatment: [1:17] [N/A:N/A] Wound Status: [1:Healed - Epithelialized] [N/A:N/A] Measurements L x W x D 0x0x0 [N/A:N/A] (cm) Area (cm) : [1:0] [N/A:N/A] Volume (cm) : [1:0] [N/A:N/A] % Reduction in Area: [1:100.00%] [N/A:N/A] % Reduction in Volume: 100.00% [N/A:N/A] Classification: [1:Full Thickness Without Exposed Support Structures] [N/A:N/A] Periwound Skin Texture: No Abnormalities Noted [N/A:N/A] Periwound Skin [1:No Abnormalities Noted] [N/A:N/A] Moisture: Periwound Skin Color: No Abnormalities Noted [N/A:N/A] Tenderness on [1:No] [N/A:N/A] Treatment Notes Electronic Signature(s) Signed: 01/20/2015 4:56:28 PM By: Curtis Sites Entered By: Curtis Sites on 01/20/2015 11:16:19 [place], [name] ([id number]) Campanella, [name] ([id number]) -------------------------------------------------------------------------------- Multi-Disciplinary Care Plan Details Patient Name: [name], [name]. Date of Service: 01/20/2015 10:45 AM Medical Record Number: [id number] Patient Account Number: [id number] Date of Birth/Sex: [birth year] (28 y.o. Female) Treating RN: Curtis Sites Primary Care Physician: Felix Pacini Other Clinician: Referring Physician: Felix Pacini Treating Physician/Extender: Rudene Re in Treatment: 17 Active Inactive Electronic Signature(s) Signed: 01/20/2015 4:56:28 PM By: Curtis Sites Entered By: Curtis Sites on 01/20/2015 11:25:09 Vroom, [name] ([id number]) -------------------------------------------------------------------------------- Patient/Caregiver Education Details Patient Name: [name], [name] Date of Service: 01/20/2015 10:45 AM Medical Record Number: [id number] Patient Account Number: [id number] Date of Birth/Gender: 02-28-1986 (28 y.o. Female) Treating RN: Curtis Sites Primary Care Physician: Felix Pacini Other Clinician: Referring Physician: Felix Pacini Treating Physician/Extender: Rudene Re in Treatment: 17 Education Assessment Education Provided To: Patient Education Topics Provided Venous: Handouts: Other: need for continued compression hose daily Methods: Explain/Verbal Responses: State content correctly Electronic Signature(s) Signed: 01/20/2015 5:11:41 PM By: Ardath Sax MD Entered By: Ardath Sax on 01/20/2015 15:51:24 Tukes, [name] ([id number]) -------------------------------------------------------------------------------- Wound Assessment Details Patient Name: [name], [name]. Date of Service: 01/20/2015 10:45 AM Medical Record Number: [id number] Patient Account Number: [id number] Date of Birth/Sex: November 07, 1985 (28 y.o. Female) Treating RN: Curtis Sites Primary Care Physician: Felix Pacini Other Clinician: Referring Physician: KUNEFF, RENEE Treating Physician/Extender: Rudene Re in Treatment: 17 Wound Status Wound Number: 1 Primary Etiology: Venous Leg Ulcer Wound Location: Right, Lateral Malleolus Wound Status: Healed - Epithelialized Wounding Event: Not Known Date Acquired: 09/06/2014 Weeks Of Treatment: 17 Clustered Wound: No Photos Photo Uploaded By: Curtis Sites on 01/20/2015 15:33:00 Wound Measurements Length: (cm) 0 % Reducti Width: (cm) 0 % Reducti Depth:  (cm) 0 Area: (cm) 0 Volume: (cm) 0 on in Area: 100% on in Volume: 100% Wound Description Full Thickness Without Exposed Classification: Support Structures Periwound Skin Texture Texture Color No Abnormalities Noted: No No Abnormalities Noted: No Moisture No Abnormalities Noted: No Electronic Signature(s) Signed: 01/20/2015 4:56:28 PM By: Jessica Priest, [name] ([id number]) Entered By: Curtis Sites on 01/20/2015 11:15:39 Popp, [name] ([id number]) -------------------------------------------------------------------------------- Vitals Details Patient Name: [name], [name] Date of Service: 01/20/2015 10:45 AM Medical Record Number: [id number] Patient Account Number: [id number] Date of Birth/Sex: 1986-04-16 (28 y.o. Female) Treating RN: Curtis Sites Primary Care Physician: Felix Pacini Other Clinician: Referring Physician: KUNEFF, RENEE Treating Physician/Extender: Rudene Re in Treatment: 17 Vital Signs Time Taken: 11:03 Temperature (F): 98.1 Height (in): 66 Pulse (bpm): 79 Weight (lbs): 350.3 Respiratory Rate (breaths/min): 18 Body Mass Index (BMI): 56.5 Blood Pressure (mmHg): 118/70 Reference Range: 80 - 120 mg / dl Electronic Signature(s) Signed: 01/20/2015 4:56:28 PM By: Curtis Sites Entered By: Curtis Sites on 01/20/2015 11:04:04

## 2015-01-25 NOTE — Progress Notes (Addendum)
[name], [name] ([id number]) Visit Report for 01/20/2015 Chief Complaint Document Details Patient Name: [name], [name] 01/20/2015 10:45 Date of Service: AM Medical Record [id number] Number: Patient Account Number: [id number] July 25, 1985 (29 y.o. Treating RN: Curtis Sites Date of Birth/Sex: Female) Other Clinician: Primary Care Physician: KUNEFF, RENEE Treating Jimmey Ralph, Lamiracle Chaidez Referring Physician: Felix Pacini Physician/Extender: Weeks in Treatment: 17 Information Obtained from: Patient Chief Complaint Patient presents for treatment of an open ulcer due to venous insufficiency. this 29 year old patient has had an ulcer on the lateral part of her right lower extremity and this has been there for 2 weeks. Electronic Signature(s) Signed: 01/20/2015 5:11:41 PM By: Ardath Sax MD Entered By: Ardath Sax on 01/20/2015 15:47:47 Winrow, [name] -------------------------------------------------------------------------------- HPI Details Patient Name: [name], [name] 01/20/2015 10:45 Date of Service: AM Medical Record [id number] Number: Patient Account Number: [id number] 1985-04-22 (29 y.o. Treating RN: Curtis Sites Date of Birth/Sex: Female) Other Clinician: Primary Care Physician: KUNEFF, RENEE Treating Jimmey Ralph, Gae Bihl Referring Physician: KUNEFF, RENEE Physician/Extender: Weeks in Treatment: 17 History of Present Illness Location: the lateral part of her right ankle Quality: Patient reports experiencing a dull pain to affected area(s). Severity: Patient states wound are getting worse. Duration: Patient has had the wound for < 2 weeks prior to presenting for treatment Timing: Pain in wound is Intermittent (comes and goes Context: The wound appeared gradually over time Modifying Factors: Consults to this date include:seen in the ER and asked to follow up with wound care. Associated Signs and Symptoms: Patient reports having difficulty standing for long  periods. HPI Description: 29 year old patient who started with having ulcerations on the right lower leg on the lateral part of her ankle for about 2 weeks. She was seen in the ER at [facility] and advised to see the wound care for a consultation. No X-rays of workup was done during the ER visit and no prescription for any medications of compression wraps were given. the patient is not diabetic but does have hypertension and her medications have been reviewed by me. 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 The right and left greater saphenous vein was found to be tortuous. Deep venous system was also not competent and there was reflux of more than 500 ms. She was then seen by Dr. Tawanna Cooler Early who recommended that the patient would not benefit from endovenous ablation and he had recommended vein stripping on the right side and multiple small phlebectomy procedures on the left side. the patient did not follow-up due to social economic reasons. She has not been wearing any compression stockings and has not taken any specific treatment for varicose veins for the last 3 years. 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 We have obtained some appointments to see the vascular surgeons by the end of the month and the patient would like to follow up with me at my [facility] on Wednesday, June 29. 10/14/2014 -- she could not see me yesterday in [place] and hence has come for a review today. She has a vascular workup to be done this afternoon at [facility]. She is doing fine otherwise. 10/22/2014 -- she was seen by Dr. [name] and he has recommended surgical removal of her right saphenous vein from  distal thigh to  saphenofemoral junction and stab phlebectomy's of multiple large tributary branches throughout her thigh and calf. This would be done under general anesthesia in the outpatient setting. 10/29/2014 -- she is trying to work on a surgical date and in the meanwhile we have got insurance [place] [place] [employer] ([id number]) clearance for Apligraf and we will start this next week. 7/22 2016 -- she is here for the first application of Apligraf. 11/19/2014 -- she is here for a second application of Apligraf 11/26/2014 -- she has done fine after her last application of Apligraf and is awaiting her surgery which is scheduled for August 31. 12/03/2014 -- she is doing fine and is here for her third application of Apligraf. 12/21/2014 -- She had surgery on 12/15/2014 by Dr.Early who did #1 ligation and stripping of right great saphenous vein from distal thigh to saphenofemoral junction, #2 stab phlebectomy of large tributary varicose veins in the thigh popliteal space and calf. She had an Ace wrap up to her groin and this was removed today and the Unna's boot was also removed. 12/28/2014 -- she is here for her fourth application of Apligraf. 01/06/2015 - he saw her vascular surgeon Dr. Arbie Cookey who was pleased with her progress and he has confirmed that no surgical procedures could be attempted on the left side. 01/13/2015 -- her wound looks very good and she's been having no problems whatsoever. Electronic Signature(s) Signed: 01/20/2015 5:11:41 PM By: Ardath Sax MD Entered By: Ardath Sax on 01/20/2015 15:48:01 Slocumb, [name] ([id number]) -------------------------------------------------------------------------------- Physical Exam Details Patient Name: [name], [name] 01/20/2015 10:45 Date of Service: AM Medical Record [id number] Number: Patient Account Number: [id number] 03-14-1986 (29 y.o. Treating RN: Curtis Sites Date of Birth/Sex: Female) Other Clinician: Primary Care Physician: KUNEFF,  Doristine Counter, Taji Barretto Referring Physician: Felix Pacini Physician/Extender: Weeks in Treatment: 17 Electronic Signature(s) Signed: 01/20/2015 5:11:41 PM By: Ardath Sax MD Entered By: Ardath Sax on 01/20/2015 15:48:15 Woznick, [name] ([id number]) -------------------------------------------------------------------------------- Physician Orders Details Patient Name: TYAUNA, LACAZE 01/20/2015 10:45 Date of Service: AM Medical Record [id number] Number: Patient Account Number: [id number] 1986-01-06 (28 y.o. Treating RN: Curtis Sites Date of Birth/Sex: Female) Other Clinician: Primary Care Physician: KUNEFF, Doristine Counter, Barbar Brede Referring Physician: Felix Pacini Physician/Extender: Weeks in Treatment: 17 Verbal / Phone Orders: Yes Clinician: Curtis Sites Read Back and Verified: Yes Diagnosis Coding Discharge From [facility] Services o Discharge from Wound Care Center Electronic Signature(s) Signed: 01/25/2015 9:13:33 AM By: Ardath Sax MD Previous Signature: 01/25/2015 9:13:11 AM Version By: Ardath Sax MD Previous Signature: 01/20/2015 4:56:28 PM Version By: Curtis Sites Previous Signature: 01/25/2015 8:06:26 AM Version By: Evlyn Kanner MD, FACS Entered By: Ardath Sax on 01/25/2015 09:13:33 Zappulla, [name] ([id number]) -------------------------------------------------------------------------------- Problem List Details Patient Name: LANISA, ISHLER 01/20/2015 10:45 Date of Service: AM Medical Record [id number] Number: Patient Account Number: [id number] [birth year] (28 y.o. 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 01/20/2015  10:45 Date of Service: AM Medical Record [id number] Number: Patient Account Number: [id number] Mar 13, 1986 (28 y.o. Treating RN: Curtis Sites Date of Birth/Sex: Female) Other Clinician: Primary Care Physician: KUNEFF, RENEE Treating Jimmey Ralph, [place] Wanner Referring Physician: Felix Pacini Physician/Extender: Weeks in Treatment: 17 Subjective Chief Complaint Information obtained from Patient Patient presents for treatment of an open ulcer due to venous insufficiency. this 29 year old patient has had an ulcer on the lateral part of her right lower extremity and this has been there for 2 weeks. History of Present Illness (HPI) The following HPI elements were documented for the patient's wound: Location: the lateral part of her right ankle Quality: Patient reports experiencing a dull pain to affected area(s). Severity: Patient states wound are getting worse. Duration: Patient has had the wound for < 2 weeks prior to presenting for treatment Timing: Pain in wound is Intermittent (comes and goes Context: The wound appeared gradually over time Modifying Factors: Consults to this date include:seen in the ER and asked to follow up with wound care. Associated Signs and Symptoms: Patient reports having difficulty standing for long periods. 29 year old patient who started with having ulcerations on the right lower leg on the lateral part of her ankle for about 2 weeks. She was seen in the ER at [facility] and  advised to see the wound care for a consultation. No X-rays of workup was done during the ER visit and no prescription for any medications of compression wraps were given. the patient is not diabetic but does have hypertension and her medications have been reviewed by me. 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 The right and left greater saphenous vein was found to be tortuous. Deep venous system was also not competent and there was reflux of more than 500 ms. She was then seen by Dr. Tawanna Cooler Early who recommended that the patient would not benefit from endovenous ablation and he had recommended vein stripping on the right side and multiple small phlebectomy procedures on the left side. the patient did not follow-up due to social economic reasons. She has not been wearing any compression stockings and has not taken any specific treatment for varicose veins for the last 3 years. 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 We have obtained some appointments to see the vascular surgeons by the end of the month and the patient would like to follow up with me at my [facility] on Wednesday, June 29. JOSHALYN, ANCHETA ([id number]) 10/14/2014 -- she could not see me yesterday in [place] and hence has come for a review today. She has a vascular workup to be done this afternoon at [facility]. She is doing fine otherwise. 10/22/2014 -- she was seen by Dr. [name] and he has recommended surgical removal of her right saphenous vein from distal thigh to saphenofemoral junction and stab phlebectomy's of multiple large tributary branches throughout her thigh and calf. This would be done under general anesthesia in the outpatient  setting. 10/29/2014 -- she is trying to work on a surgical date and in the meanwhile we have got insurance clearance for Apligraf and we will start this next week. 7/22 2016 -- she is here for the first application of Apligraf. 11/19/2014 -- she is here for  a second application of Apligraf 11/26/2014 -- she has done fine after her last application of Apligraf and is awaiting her surgery which is scheduled for August 31. 12/03/2014 -- she is doing fine and is here for her third application of Apligraf. 12/21/2014 -- She had surgery on 12/15/2014 by Dr.Early who did #1 ligation and stripping of right great saphenous vein from distal thigh to saphenofemoral junction, #2 stab phlebectomy of large tributary varicose veins in the thigh popliteal space and calf. She had an Ace wrap up to her groin and this was removed today and the Unna's boot was also removed. 12/28/2014 -- she is here for her fourth application of Apligraf. 01/06/2015 - he saw her vascular surgeon Dr. Arbie Cookey who was pleased with her progress and he has confirmed that no surgical procedures could be attempted on the left side. 01/13/2015 -- her wound looks very good and she's been having no problems whatsoever. Objective Constitutional Vitals Time Taken: 11:03 AM, Height: 66 in, Weight: 350.3 lbs, BMI: 56.5, Temperature: 98.1 F, Pulse: 79 bpm, Respiratory Rate: 18 breaths/min, Blood Pressure: 118/70 mmHg. Integumentary (Hair, Skin) Wound #1 status is Healed - Epithelialized. Original cause of wound was Not Known. The wound is located on the Right,Lateral Malleolus. The wound measures 0cm length x 0cm width x 0cm depth; 0cm^2 area and 0cm^3 volume. 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 Contiue compression Plan Discharge From [facility] Services: Discharge from Wound Care Center Follow-Up Appointments: A Patient Clinical Summary of Care was provided to [facility] Electronic Signature(s) Signed: 01/20/2015 5:11:41 PM By: Ardath Sax MD Entered By: Ardath Sax on 01/20/2015 15:50:13 Schoffstall, [name] -------------------------------------------------------------------------------- SuperBill Details Patient Name: LOCHLYN, ZULLO Date of Service: 01/20/2015 Medical Record Number: [id number] Patient Account Number: [id number] Date of Birth/Sex: Mar 03, 1986 (28 y.o. 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

## 2015-02-04 ENCOUNTER — Encounter (HOSPITAL_COMMUNITY): Payer: Self-pay | Admitting: Emergency Medicine

## 2015-02-04 ENCOUNTER — Emergency Department (HOSPITAL_COMMUNITY)
Admission: EM | Admit: 2015-02-04 | Discharge: 2015-02-04 | Disposition: A | Discharge status: Home / Self Care | Payer: BLUE CROSS/BLUE SHIELD | Attending: Emergency Medicine | Admitting: Emergency Medicine

## 2015-02-04 DIAGNOSIS — R109 Unspecified abdominal pain: Secondary | ICD-10-CM | POA: Diagnosis present

## 2015-02-04 DIAGNOSIS — E669 Obesity, unspecified: Secondary | ICD-10-CM | POA: Diagnosis not present

## 2015-02-04 DIAGNOSIS — Z3202 Encounter for pregnancy test, result negative: Secondary | ICD-10-CM | POA: Diagnosis not present

## 2015-02-04 DIAGNOSIS — I10 Essential (primary) hypertension: Secondary | ICD-10-CM | POA: Diagnosis not present

## 2015-02-04 DIAGNOSIS — R1013 Epigastric pain: Secondary | ICD-10-CM | POA: Diagnosis not present

## 2015-02-04 DIAGNOSIS — Z79899 Other long term (current) drug therapy: Secondary | ICD-10-CM | POA: Diagnosis not present

## 2015-02-04 LAB — CBC
HCT: 37.9 % (ref 36.0–46.0)
Hemoglobin: 12.2 g/dL (ref 12.0–15.0)
MCH: 29.2 pg (ref 26.0–34.0)
MCHC: 32.2 g/dL (ref 30.0–36.0)
MCV: 90.7 fL (ref 78.0–100.0)
PLATELETS: 292 10*3/uL (ref 150–400)
RBC: 4.18 MIL/uL (ref 3.87–5.11)
RDW: 14.2 % (ref 11.5–15.5)
WBC: 9.2 10*3/uL (ref 4.0–10.5)

## 2015-02-04 LAB — URINALYSIS, ROUTINE W REFLEX MICROSCOPIC
BILIRUBIN URINE: NEGATIVE
Glucose, UA: NEGATIVE mg/dL
Hgb urine dipstick: NEGATIVE
KETONES UR: NEGATIVE mg/dL
LEUKOCYTES UA: NEGATIVE
NITRITE: NEGATIVE
PROTEIN: NEGATIVE mg/dL
Specific Gravity, Urine: 1.021 (ref 1.005–1.030)
UROBILINOGEN UA: 1 mg/dL (ref 0.0–1.0)
pH: 7 (ref 5.0–8.0)

## 2015-02-04 LAB — COMPREHENSIVE METABOLIC PANEL
ALK PHOS: 59 U/L (ref 38–126)
ALT: 31 U/L (ref 14–54)
AST: 53 U/L — ABNORMAL HIGH (ref 15–41)
Albumin: 3.8 g/dL (ref 3.5–5.0)
Anion gap: 9 (ref 5–15)
BILIRUBIN TOTAL: 0.5 mg/dL (ref 0.3–1.2)
BUN: 11 mg/dL (ref 6–20)
CALCIUM: 8.9 mg/dL (ref 8.9–10.3)
CHLORIDE: 106 mmol/L (ref 101–111)
CO2: 24 mmol/L (ref 22–32)
CREATININE: 0.73 mg/dL (ref 0.44–1.00)
Glucose, Bld: 101 mg/dL — ABNORMAL HIGH (ref 65–99)
Potassium: 4.3 mmol/L (ref 3.5–5.1)
Sodium: 139 mmol/L (ref 135–145)
TOTAL PROTEIN: 7.3 g/dL (ref 6.5–8.1)

## 2015-02-04 LAB — LIPASE, BLOOD: LIPASE: 19 U/L (ref 11–51)

## 2015-02-04 LAB — I-STAT BETA HCG BLOOD, ED (MC, WL, AP ONLY): I-stat hCG, quantitative: <5 m[IU]/mL (ref <5)

## 2015-02-04 MED ORDER — PANTOPRAZOLE SODIUM 20 MG PO TBEC: 20.0000 mg | DELAYED_RELEASE_TABLET | Freq: Two times a day (BID) | ORAL | Status: DC

## 2015-02-04 NOTE — ED Notes (Signed)
Pt informed of need for urine sample; urine cup at bedside; pt verbalized understanding

## 2015-02-04 NOTE — ED Notes (Signed)
Pt reports same mid abdominal pain that she had several months ago. Denies nvd.

## 2015-02-04 NOTE — ED Provider Notes (Signed)
CSN: [id number]     Arrival date & time 02/04/15  0057 History   First MD Initiated Contact with Patient 02/04/15 [phone]     Chief Complaint  Patient presents with  . Abdominal Pain     (Consider location/radiation/quality/duration/timing/severity/associated sxs/prior Treatment) HPI Comments: 29 year old female with hypertension who presents with abdominal pain. Patient states that at around 9:30 PM tonight, she began having midepigastric abdominal pain that is nonradiating, intermittently severe, and associated with diaphoresis and shakiness. She denies any associated nausea, vomiting, diarrhea, constipation, or blood in her stool. She has had this pain previously, most recently was several months ago. She reports that she has had more alcohol recently around her birthday. She denies any fevers, urinary symptoms, or vaginal bleeding/discharge.  Patient is a 29 y.o. female presenting with abdominal pain. The history is provided by the patient.  Abdominal Pain   Past Medical History  Diagnosis Date  . Obesity   . Varicose veins   . Ankle ulcer (HCC)   . 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 Hypertension    Past Surgical History  Procedure Laterality Date  . Tonsillectomy    . Tympanostomy tube placement    . Vein ligation and stripping Right 12/15/2014    Procedure: SAPHENOUS VEIN LIGATION AND STRIPPING; GREATER THAN 20 STAB PHLEBECTOMIES;  Surgeon: Larina Earthlyodd F Early, MD;  [facility];  Service: Vascular;  Laterality: Right;   Family History  Problem Relation Age of Onset  . Diabetes Mother   . Varicose Veins Mother   . Varicose Veins Sister    Social History  Substance Use Topics  . Smoking status: Never Smoker   . Smokeless tobacco: Never Used  . Alcohol Use: 0.6 oz/week    1 Glasses of wine per week     Comment: occasional   OB History    No data available     Review of Systems  Gastrointestinal: Positive for abdominal pain.    10 Systems reviewed and are negative for acute change except as noted in the HPI.    Allergies  Review of patient's allergies indicates no known allergies.  Home Medications   Prior to Admission medications   Medication Sig Start Date End Date Taking? Authorizing Provider  hydrochlorothiazide (HYDRODIURIL) 12.5 MG tablet Take 1 tablet (12.5 mg total) by mouth daily. 02/05/14  Yes [name], DO  amoxicillin (AMOXIL) 500 MG capsule Take 2 grams (4 tablets) by mouth the evening prior to procedure with a meal. Patient not taking: Reported on 01/04/2015 12/02/14   Larina Earthlyodd F Early, MD  pantoprazole (PROTONIX) 20 MG tablet Take 1 tablet (20 mg total) by mouth 2 (two) times daily. 02/04/15   Ambrose Finlandachel Morgan Bekah Igoe, MD   BP 109/65 mmHg  Pulse 74  Temp(Src) 97.8 F (36.6 C) (Oral)  Resp 18  Ht 5\' 8"  (1.727 m)  Wt 347 lb (157.398 kg)  BMI 52.77 kg/m2  SpO2 98%  LMP 01/28/2015 Physical Exam  Constitutional: She is oriented to person, place, and time. She appears well-developed and well-nourished. No distress.  HENT:  Head: Normocephalic and atraumatic.  Moist mucous membranes  Eyes: Conjunctivae are normal. Pupils are equal, round, and reactive to light.  Neck: Neck supple.  Cardiovascular: Normal rate, regular rhythm and normal heart sounds.   No murmur heard. Pulmonary/Chest: Effort normal and breath sounds normal.  Abdominal: Soft. Bowel sounds are normal. She exhibits no distension.  There is no tenderness.  Musculoskeletal:  1+ pitting edema b/l LE; bandage on R lower leg  Neurological: She is alert and oriented to person, place, and time.  Fluent speech  Skin: Skin is warm and dry.  Psychiatric: She has a normal mood and affect. Judgment normal.  Nursing note and vitals reviewed.   ED Course  Procedures (including critical care time) Labs Review Labs Reviewed  COMPREHENSIVE METABOLIC PANEL - Abnormal; Notable for the following:    Glucose, Bld 101 (*)    AST 53 (*)     All other components within normal limits  LIPASE, BLOOD  CBC  URINALYSIS, ROUTINE W REFLEX MICROSCOPIC (NOT AT [facility])  I-STAT BETA HCG BLOOD, ED ([facility], [facility], [facility] ONLY)   MDM   Final diagnoses:  Epigastric pain   29 year old female who presents with epigastric abdominal pain that began a few hours after dinner tonight. Patient has had this type of pain several times previously. No tenderness noted on exam. Vital signs unremarkable. Labs unremarkable w/ exception of mildly elevated AST at 53. Given location of pain and h/o recent heavier alcohol use, I suspect her sx may be due to gastritis vs PUD. Provided with Rx for PPI and instructed to f/u w/ PCP if sx continue to have GI referral. Return precautions including fever, lower abd pain, intractable vomiting reviewed. Pt discharged in satisfactory condition.     Laurence Spates, MD 02/04/15 [phone]

## 2015-02-10 ENCOUNTER — Encounter: Payer: Self-pay | Admitting: Vascular Surgery

## 2015-02-15 ENCOUNTER — Ambulatory Visit: Payer: BLUE CROSS/BLUE SHIELD | Admitting: Vascular Surgery

## 2015-02-22 ENCOUNTER — Ambulatory Visit: Payer: BLUE CROSS/BLUE SHIELD | Admitting: Internal Medicine

## 2015-02-28 ENCOUNTER — Other Ambulatory Visit: Payer: Self-pay | Admitting: *Deleted

## 2015-02-28 DIAGNOSIS — I10 Essential (primary) hypertension: Secondary | ICD-10-CM

## 2015-02-28 MED ORDER — HYDROCHLOROTHIAZIDE 12.5 MG PO TABS: 12.5000 mg | ORAL_TABLET | Freq: Every day | ORAL | Status: DC

## 2015-02-28 NOTE — Telephone Encounter (Signed)
Will refill for a couple of months.  Patient needs to schedule appt to be seen has been >1 year.  Please advise.

## 2015-03-01 ENCOUNTER — Telehealth: Payer: Self-pay | Admitting: Internal Medicine

## 2015-03-01 ENCOUNTER — Encounter: Payer: Self-pay | Admitting: Internal Medicine

## 2015-03-01 ENCOUNTER — Ambulatory Visit (INDEPENDENT_AMBULATORY_CARE_PROVIDER_SITE_OTHER): Payer: BLUE CROSS/BLUE SHIELD | Admitting: Internal Medicine

## 2015-03-01 VITALS — BP 138/78 | HR 57 | Temp 97.7°F | Wt 339.9 lb

## 2015-03-01 DIAGNOSIS — I10 Essential (primary) hypertension: Secondary | ICD-10-CM

## 2015-03-01 DIAGNOSIS — R12 Heartburn: Secondary | ICD-10-CM | POA: Diagnosis not present

## 2015-03-01 DIAGNOSIS — R1013 Epigastric pain: Secondary | ICD-10-CM | POA: Diagnosis not present

## 2015-03-01 LAB — HEMOCCULT GUIAC POC 1CARD (OFFICE): FECAL OCCULT BLD: NEGATIVE

## 2015-03-01 LAB — POCT H PYLORI SCREEN: H Pylori Screen, POC: NEGATIVE

## 2015-03-01 MED ORDER — HYDROCHLOROTHIAZIDE 12.5 MG PO TABS: 12.5000 mg | ORAL_TABLET | Freq: Every day | ORAL | Status: DC

## 2015-03-01 MED ORDER — PANTOPRAZOLE SODIUM 40 MG PO TBEC: 40.0000 mg | DELAYED_RELEASE_TABLET | Freq: Every day | ORAL | Status: DC

## 2015-03-01 NOTE — Patient Instructions (Addendum)
It was great seeing you today!   1. I will call you with the results of your blood work and stool test today.  2. Take the Protonix daily.  3. Do not take Aleve, Ibuprofen or other NSAIDs.  4. Avoid spicy foods, cheesy foods, fast food and alcohol as these might be triggers for the stomach pain.    Please bring all your medications to every doctors visit  Sign up for My Chart to have easy access to your labs results, and communication with your Primary care physician.  Next Appointment  Please call to make an appointment if your pain has not improved with the Protonix and avoidance of triggers.   I look forward to talking with you again at our next visit. If you have any questions or concerns before then, please call the clinic at [phone].  Take Care,   Dr. Marcy Sirenatherine Meredith Kilbride

## 2015-03-01 NOTE — Assessment & Plan Note (Addendum)
Based on history likely GERD or PUD. Gave Rx for Protonix and counseled on importance of avoidance of triggers. Obtained H pylori blood test to evaluate for bacteria as cause of symptoms. Also performed FOBT to evaluate for GI bleeding. Instructed patient to return in 2 months if unimproved. Would consider GI referral at that point for EGD.

## 2015-03-01 NOTE — Telephone Encounter (Signed)
Informed patient that H pylori and FOBT were negative. She is to continue with Protonix and avoidance of triggers as discussed at office visit.    While on the phone, patient brought up need for pap smear. I let her know that her last pap in Oct 2015 was negative and that she wouldn't need one until 2018. She stated that cervical cancer runs in her family so she would like to have a pap done earlier if possible. I instructed her to call the front desk to make an appointment to discuss this with her PCP and that we could likely do one for her comfort level.   Marcy Sirenatherine Wallace, D.O. 03/01/2015, 3:56 PM PGY-1, [facility]

## 2015-03-01 NOTE — Progress Notes (Signed)
Subjective:    [name] - 29 y.o. female MRN [id number]  Date of birth: 10/02/1985  HPI  [name] is here for f/u from ED visit for abdominal pain. Was seen in ED on 10/21 for epigastric abdominal pain thought to be related to gastritis vs. PUD from recent heavy EtOH use. Was given Rx for Protonix.   Today, patient reports that she only took one Protonix pill before losing the script during a move. She states that she has not had any pain in the past 3-4 days. Reports that she had first initial episode of epigastric pain about 2 years ago. Has been seen in the ED a few times for this complaint. Describes the pain as a burning sensation in the epigastric region. Will be constant for about an hour or two when it occurs. Related to eating, especially fast foods. Also related to EtOH use. Had a varicose vein surgery this summer and was taking Naproxen daily for this surgery. Discontinued the Naproxen almost one month ago. Has never had an EGD. Denies hematochezia. Nausea associated with pain but no emesis. Bowel movements have been normal.    Health Maintenance Due  Topic Date Due  . INFLUENZA VACCINE  11/15/2014    -  reports that she has never smoked. She has never used smokeless tobacco. - Review of Systems: Per HPI. - Past Medical History: Patient Active Problem List   Diagnosis Date Noted  . Epigastric abdominal pain 03/01/2015  . 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 Low back pain 04/23/2014  . Possible exposure to STD 01/25/2014  . Birth control counseling 01/25/2014  . Essential hypertension, benign 11/16/2013  . Well woman exam 11/16/2013  . Elevated BP 10/08/2013  . Menorrhagia 10/08/2013  . History of iritis 10/08/2013  . Varicose veins of lower extremities with other complications 10/25/2011  . Obesity 10/25/2011   - Medications: reviewed and updated Current Outpatient Prescriptions  Medication Sig Dispense Refill    . amoxicillin (AMOXIL) 500 MG capsule Take 2 grams (4 tablets) by mouth the evening prior to procedure with a meal. (Patient not taking: Reported on 01/04/2015) 4 capsule 2  . hydrochlorothiazide (HYDRODIURIL) 12.5 MG tablet Take 1 tablet (12.5 mg total) by mouth daily. 90 tablet 0  . pantoprazole (PROTONIX) 40 MG tablet Take 1 tablet (40 mg total) by mouth daily. 30 tablet 1   No current facility-administered medications for this visit.       Objective:   Physical Exam BP 138/78 mmHg  Pulse 57  Temp(Src) 97.7 F (36.5 C) (Oral)  Wt 339 lb 14.4 oz (154.178 kg)  SpO2 99%  LMP 01/28/2015 Gen: NAD, alert, cooperative with exam, well-appearing CV: RRR, good S1/S2, no murmur, no edema, capillary refill brisk  Resp: CTABL, no wheezes, non-labored Abd: SNTND, BS present, no guarding or organomegaly, small reducible hernia present to the right of the umbilicus  Skin: no rashes, normal turgor  Neuro: no gross deficits.  Psych: good insight, alert and oriented        Assessment & Plan:   Epigastric abdominal pain Based on history likely GERD or PUD. Gave Rx for Protonix and counseled on importance of avoidance of triggers. Obtained H pylori blood test to evaluate for bacteria as cause of symptoms. Also performed FOBT to evaluate for GI bleeding. Instructed patient to return in 2 months if unimproved. Would consider GI referral at that point for EGD.  Marcy Siren, D.O. 03/01/2015, 3:04 PM PGY-1, [facility]

## 2015-04-29 ENCOUNTER — Other Ambulatory Visit: Payer: Self-pay | Admitting: Internal Medicine

## 2015-05-07 ENCOUNTER — Other Ambulatory Visit: Payer: Self-pay | Admitting: Internal Medicine

## 2015-05-12 ENCOUNTER — Telehealth: Payer: Self-pay | Admitting: *Deleted

## 2015-05-12 NOTE — Telephone Encounter (Signed)
Prior Authorization received from [facility] for Pantoprazole 40 mg.  PA form placed in provider box for completion. Clovis Pu, RN

## 2015-05-13 NOTE — Telephone Encounter (Signed)
PA faxed to [facility] for review.  Review process could take 24-72 hours to complete.  Clovis Pu, RN

## 2015-05-13 NOTE — Telephone Encounter (Signed)
Actually prescribed by Dr Earlene Plater.  Unsure if insurance will cover for dx "epigastric abdominal pain".  Forms completed and returned to [facility].

## 2015-05-16 NOTE — Telephone Encounter (Signed)
PA for Pantoprazole 40 mg was denied via [facility].  [facility] is aware of denial.  Patient may purchase the medication out of pocket. Denial placed in provider box for review.   Clovis Pu, RN

## 2015-05-23 ENCOUNTER — Ambulatory Visit: Payer: BLUE CROSS/BLUE SHIELD | Admitting: Family Medicine

## 2015-06-19 ENCOUNTER — Emergency Department (HOSPITAL_COMMUNITY)
Admission: EM | Admit: 2015-06-19 | Discharge: 2015-06-19 | Disposition: A | Discharge status: Home / Self Care | Payer: BLUE CROSS/BLUE SHIELD | Attending: Emergency Medicine | Admitting: Emergency Medicine

## 2015-06-19 ENCOUNTER — Encounter (HOSPITAL_COMMUNITY): Payer: Self-pay

## 2015-06-19 DIAGNOSIS — I10 Essential (primary) hypertension: Secondary | ICD-10-CM | POA: Insufficient documentation

## 2015-06-19 DIAGNOSIS — E669 Obesity, unspecified: Secondary | ICD-10-CM | POA: Insufficient documentation

## 2015-06-19 DIAGNOSIS — Z872 Personal history of diseases of the skin and subcutaneous tissue: Secondary | ICD-10-CM | POA: Diagnosis not present

## 2015-06-19 DIAGNOSIS — R5383 Other fatigue: Secondary | ICD-10-CM | POA: Diagnosis not present

## 2015-06-19 DIAGNOSIS — B349 Viral infection, unspecified: Secondary | ICD-10-CM | POA: Diagnosis not present

## 2015-06-19 DIAGNOSIS — Z3202 Encounter for pregnancy test, result negative: Secondary | ICD-10-CM | POA: Insufficient documentation

## 2015-06-19 DIAGNOSIS — Z79899 Other long term (current) drug therapy: Secondary | ICD-10-CM | POA: Diagnosis not present

## 2015-06-19 DIAGNOSIS — R197 Diarrhea, unspecified: Secondary | ICD-10-CM | POA: Diagnosis present

## 2015-06-19 LAB — CBC WITH DIFFERENTIAL/PLATELET
Basophils Absolute: 0 10*3/uL (ref 0.0–0.1)
Basophils Relative: 0 %
Eosinophils Absolute: 0.1 10*3/uL (ref 0.0–0.7)
Eosinophils Relative: 1 %
HCT: 38 % (ref 36.0–46.0)
Hemoglobin: 12.1 g/dL (ref 12.0–15.0)
Lymphocytes Relative: 34 %
Lymphs Abs: 1.4 10*3/uL (ref 0.7–4.0)
MCH: 28.3 pg (ref 26.0–34.0)
MCHC: 31.8 g/dL (ref 30.0–36.0)
MCV: 89 fL (ref 78.0–100.0)
Monocytes Absolute: 0.6 10*3/uL (ref 0.1–1.0)
Monocytes Relative: 16 %
Neutro Abs: 2 10*3/uL (ref 1.7–7.7)
Neutrophils Relative %: 49 %
Platelets: 216 10*3/uL (ref 150–400)
RBC: 4.27 MIL/uL (ref 3.87–5.11)
RDW: 13.7 % (ref 11.5–15.5)
WBC: 4 10*3/uL (ref 4.0–10.5)

## 2015-06-19 LAB — COMPREHENSIVE METABOLIC PANEL
ALT: 18 U/L (ref 14–54)
AST: 19 U/L (ref 15–41)
Albumin: 3.4 g/dL — ABNORMAL LOW (ref 3.5–5.0)
Alkaline Phosphatase: 43 U/L (ref 38–126)
Anion gap: 10 (ref 5–15)
BUN: 12 mg/dL (ref 6–20)
CO2: 24 mmol/L (ref 22–32)
Calcium: 8 mg/dL — ABNORMAL LOW (ref 8.9–10.3)
Chloride: 104 mmol/L (ref 101–111)
Creatinine, Ser: 0.86 mg/dL (ref 0.44–1.00)
GFR calc Af Amer: >60 mL/min (ref >60)
GFR calc non Af Amer: >60 mL/min (ref >60)
Glucose, Bld: 86 mg/dL (ref 65–99)
Potassium: 3.2 mmol/L — ABNORMAL LOW (ref 3.5–5.1)
Sodium: 138 mmol/L (ref 135–145)
Total Bilirubin: 0.5 mg/dL (ref 0.3–1.2)
Total Protein: 6.6 g/dL (ref 6.5–8.1)

## 2015-06-19 LAB — URINALYSIS, ROUTINE W REFLEX MICROSCOPIC
Glucose, UA: NEGATIVE mg/dL
Ketones, ur: NEGATIVE mg/dL
Leukocytes, UA: NEGATIVE
Nitrite: NEGATIVE
Protein, ur: 30 mg/dL — AB
Specific Gravity, Urine: 1.033 — ABNORMAL HIGH (ref 1.005–1.030)
pH: 6 (ref 5.0–8.0)

## 2015-06-19 LAB — URINE MICROSCOPIC-ADD ON
Bacteria, UA: NONE SEEN
WBC, UA: NONE SEEN WBC/hpf (ref 0–5)

## 2015-06-19 LAB — LIPASE, BLOOD: Lipase: 18 U/L (ref 11–51)

## 2015-06-19 LAB — PREGNANCY, URINE: PREG TEST UR: NEGATIVE

## 2015-06-19 MED ORDER — POTASSIUM CHLORIDE CRYS ER 20 MEQ PO TBCR
20.0000 meq | EXTENDED_RELEASE_TABLET | Freq: Once | ORAL | Status: AC
  Administered 2015-06-19: 20 meq via ORAL
  Filled 2015-06-19: qty 1

## 2015-06-19 MED ORDER — BENZONATATE 100 MG PO CAPS: 100.0000 mg | ORAL_CAPSULE | Freq: Three times a day (TID) | ORAL | Status: DC

## 2015-06-19 NOTE — ED Provider Notes (Signed)
CSN: [id number]     Arrival date & time 06/19/15  1317 History   First MD Initiated Contact with Patient 06/19/15 1407     Chief Complaint  Patient presents with  . Diarrhea  . Fatigue    (Consider location/radiation/quality/duration/timing/severity/associated sxs/prior Treatment) Patient is a 30 y.o. female presenting with diarrhea. The history is provided by the patient and medical records. No language interpreter was used.  Diarrhea Associated symptoms: no abdominal pain, no chills, no fever and no vomiting    [name] SimmondsShama J Ewy is a 30 y.o. female  with a PMH of HTN who presents to the Emergency Department complaining of nonchanging generalized body aches, congestion, nausea 2-3 days. Associated symptoms include decreased appetite, fatigue, loose stools. Patient admits to 3-4 nonbloody loose stools per day over the last 2 days. Denies fever, abdominal pain, chest pain, cough. Denies sick contacts. Over-the-counter pain medication taken with little relief. No other medications taken prior to arrival for symptoms.  Past Medical History  Diagnosis Date  . Obesity   . Varicose veins   . Ankle ulcer (HCC)   . 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 Hypertension    Past Surgical History  Procedure Laterality Date  . Tonsillectomy    . Tympanostomy tube placement    . Vein ligation and stripping Right 12/15/2014    Procedure: SAPHENOUS VEIN LIGATION AND STRIPPING; GREATER THAN 20 STAB PHLEBECTOMIES;  Surgeon: Larina Earthlyodd F Early, MD;  [facility];  Service: Vascular;  Laterality: Right;   Family History  Problem Relation Age of Onset  . Diabetes Mother   . Varicose Veins Mother   . Varicose Veins Sister    Social History  Substance Use Topics  . Smoking status: Never Smoker   . Smokeless tobacco: Never Used  . Alcohol Use: 0.6 oz/week    1 Glasses of wine per week     Comment: occasional   OB History    No data available     Review of  Systems  Constitutional: Negative for fever and chills.  HENT: Positive for congestion. Negative for sore throat.   Eyes: Negative for visual disturbance.  Respiratory: Positive for cough. Negative for shortness of breath and wheezing.   Cardiovascular: Negative.   Gastrointestinal: Positive for nausea and diarrhea. Negative for vomiting and abdominal pain.  Genitourinary: Negative for dysuria.  Musculoskeletal: Negative for back pain and neck pain.  Skin: Negative for rash.  Neurological: Negative for dizziness and weakness.      Allergies  Review of patient's allergies indicates no known allergies.  Home Medications   Prior to Admission medications   Medication Sig Start Date End Date Taking? Authorizing Provider  amoxicillin (AMOXIL) 500 MG capsule Take 2 grams (4 tablets) by mouth the evening prior to procedure with a meal. Patient not taking: Reported on 01/04/2015 12/02/14   Larina Earthlyodd F Early, MD  benzonatate (TESSALON) 100 MG capsule Take 1 capsule (100 mg total) by mouth every 8 (eight) hours. 06/19/15   Chase PicketJaime Pilcher Ward, PA-C  hydrochlorothiazide (HYDRODIURIL) 12.5 MG tablet Take 1 tablet (12.5 mg total) by mouth daily. 03/01/15   Arvilla Marketatherine Lauren Wallace, DO  pantoprazole (PROTONIX) 40 MG tablet TAKE 1 TABLET (40 MG TOTAL) BY MOUTH DAILY. 05/09/15   Ashly M Gottschalk, DO   BP 109/71 mmHg  Pulse 78  Temp(Src) 98.2 F (36.8 C) (Oral)  Resp 16  Ht 5\' 6"  (1.676 m)  Wt 149.914 kg  BMI 53.37 kg/m2  SpO2 99%  LMP 05/30/2015 Physical Exam  Constitutional: She is oriented to person, place, and time. She appears well-developed and well-nourished.  Alert and in no acute distress  HENT:  Head: Normocephalic and atraumatic.  + Nasal congestion. Oropharynx with erythema, no exudates, no tonsillar hypertrophy. Moist mucous membranes.  Cardiovascular: Normal rate, regular rhythm, normal heart sounds and intact distal pulses.  Exam reveals no gallop and no friction rub.   No murmur  heard. Pulmonary/Chest: Effort normal and breath sounds normal. No respiratory distress. She has no wheezes. She has no rales. She exhibits no tenderness.  Abdominal: Bowel sounds are normal. She exhibits no mass. There is no rebound and no guarding.  Abdomen soft, nontender, nondistended.  Musculoskeletal: She exhibits no edema.  Neurological: She is alert and oriented to person, place, and time.  Skin: Skin is warm and dry. No rash noted.  Cap refill less than 3 seconds.  Psychiatric: She has a normal mood and affect. Her behavior is normal. Judgment and thought content normal.  Nursing note and vitals reviewed.   ED Course  Procedures (including critical care time) Labs Review Labs Reviewed  COMPREHENSIVE METABOLIC PANEL - Abnormal; Notable for the following:    Potassium 3.2 (*)    Calcium 8.0 (*)    Albumin 3.4 (*)    All other components within normal limits  URINALYSIS, ROUTINE W REFLEX MICROSCOPIC (NOT AT [facility]) - Abnormal; Notable for the following:    Specific Gravity, Urine 1.033 (*)    Hgb urine dipstick SMALL (*)    Bilirubin Urine SMALL (*)    Protein, ur 30 (*)    All other components within normal limits  URINE MICROSCOPIC-ADD ON - Abnormal; Notable for the following:    Squamous Epithelial / LPF 0-5 (*)    All other components within normal limits  CBC WITH DIFFERENTIAL/PLATELET  PREGNANCY, URINE  LIPASE, BLOOD    Imaging Review No results found. I have personally reviewed and evaluated these images and lab results as part of my medical decision-making.   EKG Interpretation None      MDM   Final diagnoses:  Viral syndrome   [name] presents with congestion, body aches, decreased appetite, and loose stools over the last 2-3 days. Likely of viral etiology. On exam, oropharynx with erythema, but no exudates or tonsillar hypertrophy. Benign abdominal exam with no tenderness. Lungs are clear bilaterally. No signs of dehydration on exam. CBC and  lipase within final limits. CMP with potassium of 3.2, likely secondary to loose stools. Potassium replenished in the ED. UA with no signs of infection. Sp gravity at 1.033 - Patient orally rehydrated in the ED. Patient given home care instructions, return precautions, and follow-up care instructions. All questions answered.  [facility] Ward, PA-C 06/19/15 1639  Mancel Bale, MD 06/19/15 [id number]

## 2015-06-19 NOTE — ED Notes (Addendum)
Onset 2-3 days runny nose, decreased appetite, nausea, generalized body aches and fatigue. Loose stools x 4 per day.  No cough, sore throat or fever.  NAD at triage

## 2015-06-19 NOTE — Discharge Instructions (Signed)
1. Medications: tessalon for cough, continue usual home medications 2. Treatment: rest, drink plenty of fluids, take tylenol or ibuprofen for fever control as needed 3. Follow Up: Please follow up with your primary doctor in 3 days for discussion of your diagnoses and further evaluation after today's visit; Return to the ER for high fevers, difficulty breathing or other concerning symptoms

## 2015-06-24 ENCOUNTER — Ambulatory Visit: Payer: BLUE CROSS/BLUE SHIELD | Admitting: Family Medicine

## 2015-06-27 ENCOUNTER — Other Ambulatory Visit: Payer: Self-pay | Admitting: *Deleted

## 2015-06-27 DIAGNOSIS — I10 Essential (primary) hypertension: Secondary | ICD-10-CM

## 2015-06-27 MED ORDER — HYDROCHLOROTHIAZIDE 12.5 MG PO TABS: 12.5000 mg | ORAL_TABLET | Freq: Every day | ORAL | Status: DC

## 2015-06-27 NOTE — Telephone Encounter (Signed)
Will send in HCTZ.  Please call patient to make sure that she is taking a daily multivitamin.  Her K was a little low on her labs last week.  Would love if she scheduled an appointment to follow up in about 3 weeks with me.

## 2015-06-28 NOTE — Telephone Encounter (Signed)
LVM for pt to call the office to inform her of below. Sharon T Saunders, CMA  

## 2015-06-29 NOTE — Telephone Encounter (Signed)
Contacted pt and gave her the below information and she stated that she has an appointment with her PCP and she said that they gave her some potassium at the hospital and did not say anything about a multivitamin so she would discuss that with her doctor on Friday. Forwarding to PCP as an [place]. Lamonte SakaiZimmerman Rumple, April D, [place]

## 2015-07-01 ENCOUNTER — Ambulatory Visit (INDEPENDENT_AMBULATORY_CARE_PROVIDER_SITE_OTHER): Payer: BLUE CROSS/BLUE SHIELD | Admitting: Family Medicine

## 2015-07-01 ENCOUNTER — Encounter: Payer: Self-pay | Admitting: Family Medicine

## 2015-07-01 VITALS — BP 126/108 | HR 85 | Temp 98.3°F | Ht 66.0 in | Wt 330.2 lb

## 2015-07-01 DIAGNOSIS — R0683 Snoring: Secondary | ICD-10-CM | POA: Diagnosis not present

## 2015-07-01 DIAGNOSIS — R5383 Other fatigue: Secondary | ICD-10-CM

## 2015-07-01 DIAGNOSIS — I10 Essential (primary) hypertension: Secondary | ICD-10-CM | POA: Diagnosis not present

## 2015-07-01 DIAGNOSIS — E876 Hypokalemia: Secondary | ICD-10-CM

## 2015-07-01 DIAGNOSIS — J302 Other seasonal allergic rhinitis: Secondary | ICD-10-CM

## 2015-07-01 LAB — TSH: TSH: 1.13 m[IU]/L

## 2015-07-01 MED ORDER — LORATADINE 10 MG PO TABS: 10.0000 mg | ORAL_TABLET | Freq: Every day | ORAL | Status: DC

## 2015-07-01 NOTE — Patient Instructions (Signed)
I think that you would greatly benefit from seeing a nutritionist.  Please call Dr Gerilyn PilgrimSykes at [phone] to schedule an appointment.  I have placed an order for sleep study to look for sleep apnea.  I will contact you will the results of your labs.  If anything is abnormal, I will call you.  Otherwise, expect a copy to be mailed to you.   Allergic Rhinitis Allergic rhinitis is when the mucous membranes in the nose respond to allergens. Allergens are particles in the air that cause your body to have an allergic reaction. This causes you to release allergic antibodies. Through a chain of events, these eventually cause you to release histamine into the blood stream. Although meant to protect the body, it is this release of histamine that causes your discomfort, such as frequent sneezing, congestion, and an itchy, runny nose.  CAUSES Seasonal allergic rhinitis (hay fever) is caused by pollen allergens that may come from grasses, trees, and weeds. Year-round allergic rhinitis (perennial allergic rhinitis) is caused by allergens such as house dust mites, pet dander, and mold spores. SYMPTOMS  Nasal stuffiness (congestion).  Itchy, runny nose with sneezing and tearing of the eyes. DIAGNOSIS Your health care provider can help you determine the allergen or allergens that trigger your symptoms. If you and your health care provider are unable to determine the allergen, skin or blood testing may be used. Your health care provider will diagnose your condition after taking your health history and performing a physical exam. Your health care provider may assess you for other related conditions, such as asthma, pink eye, or an ear infection. TREATMENT Allergic rhinitis does not have a cure, but it can be controlled by:  Medicines that block allergy symptoms. These may include allergy shots, nasal sprays, and oral antihistamines.  Avoiding the allergen. Hay fever may often be treated with antihistamines in pill  or nasal spray forms. Antihistamines block the effects of histamine. There are over-the-counter medicines that may help with nasal congestion and swelling around the eyes. Check with your health care provider before taking or giving this medicine. If avoiding the allergen or the medicine prescribed do not work, there are many new medicines your health care provider can prescribe. Stronger medicine may be used if initial measures are ineffective. Desensitizing injections can be used if medicine and avoidance does not work. Desensitization is when a patient is given ongoing shots until the body becomes less sensitive to the allergen. Make sure you follow up with your health care provider if problems continue. HOME CARE INSTRUCTIONS It is not possible to completely avoid allergens, but you can reduce your symptoms by taking steps to limit your exposure to them. It helps to know exactly what you are allergic to so that you can avoid your specific triggers. SEEK MEDICAL CARE IF:  You have a fever.  You develop a cough that does not stop easily (persistent).  You have shortness of breath.  You start wheezing.  Symptoms interfere with normal daily activities.   This information is not intended to replace advice given to you by your health care provider. Make sure you discuss any questions you have with your health care provider.   Document Released: 12/26/2000 Document Revised: 04/23/2014 Document Reviewed: 12/08/2012 Elsevier Interactive Patient Education [employer] Elsevier Inc.

## 2015-07-01 NOTE — Progress Notes (Signed)
   Subjective: CC: fatigue NWG:NFAOZHPI:[name] is a 30 y.o. female presenting to clinic today for same day appointment. PCP: Delynn FlavinAshly Gottschalk, DO Concerns today include:  1. Fatigue Patient reports that she was seen in ED for fatigue.  Was told she had a viral illness.  She notes that symptoms have improved.  Though states that is feeling very tired in the mornings.  She notes that she is sleeping anywhere from 5-10 hours per night. Patient notes increased stress.  Patient notes that she does not eat breakfast. She notes she binges at night time because she does not usually eat during the day.  Snores at night.  She notes one episode of choking in the middle of the night.  Unsure of apneic spells.  Never been evaluated for sleep apnea.  2. Hypertension Blood pressure today: 126/108 Meds: Did not take BP med today. ROS: Denies headache, dizziness, visual changes, nausea, vomiting, chest pain, abdominal pain or shortness of breath.  3. Allergies Patient reports rhinorrhea.  She had a viral illness earlier this month and notes that she has continued to have runny nose.  Denies fevers, cough, congestion.  4. Hypokalemia Noted on recent labs.  Patient does not take a MVI.  She does note cramping in her muscles occasionally.  Also reports decreased energy.  Social History Reviewed. FamHx and MedHx reviewed.  Please see EMR. Health Maintenance: Flu vaccine  ROS: Per HPI  Objective: Office vital signs reviewed. BP 126/108 mmHg  Pulse 85  Temp(Src) 98.3 F (36.8 C) (Oral)  Ht 5\' 6"  (1.676 m)  Wt 330 lb 3.2 oz (149.778 kg)  BMI 53.32 kg/m2  LMP 05/30/2015  Physical Examination:  General: Awake, alert, morbidly obese, No acute distress HEENT: MMM, clear nasal discharge appreciated, slightly fullness of neck but no thyroid nodules palpated Cardio: regular rate and rhythm, S1S2 heard, no murmurs appreciated Pulm: clear to auscultation bilaterally, no wheezes, rhonchi or rales, normal  work of breathing on room air Extremities: warm, well perfused, No edema, cyanosis or clubbing MSK: Normal gait and station  Assessment/ Plan: 30 y.o. female   1. Other fatigue. Concern for OSA contributing to day time sleepiness vs thyroid disorder vs electrolyte deficiency - Split night study; Future - BASIC METABOLIC PANEL WITH GFR - TSH  2. Snoring. Concern for OSA given snoring and morbid obesity. - Split night study; Future  3. Morbid obesity due to excess calories (HCC).  Patient reports decreased energy, low activity levels, and disordered eating (skips meals during day and binges at night) - Referral to Dr Gerilyn PilgrimSykes for nutrition evaluation, counseling  4. Essential hypertension, benign.  No red flag symptoms. - Did not take BP med today  5. Other seasonal allergic rhinitis. Declines nasal spray.  Prefers oral tablet.  Will rx Claritin to avoid further sedation. - loratadine (CLARITIN) 10 MG tablet; Take 1 tablet (10 mg total) by mouth daily.  Dispense: 30 tablet; Refill: 11  6. Hypokalemia - Recommend daily MVI - BMP today - Will call patient with results   Raliegh IpAshly M Gottschalk, DO PGY-2, [facility]

## 2015-07-02 LAB — BASIC METABOLIC PANEL WITH GFR
BUN: 15 mg/dL (ref 7–25)
CALCIUM: 8.5 mg/dL — AB (ref 8.6–10.2)
CHLORIDE: 103 mmol/L (ref 98–110)
CO2: 26 mmol/L (ref 20–31)
CREATININE: 0.79 mg/dL (ref 0.50–1.10)
GFR, Est African American: >89 mL/min (ref >=60)
GFR, Est Non African American: >89 mL/min (ref >=60)
Glucose, Bld: 85 mg/dL (ref 65–99)
Potassium: 4.1 mmol/L (ref 3.5–5.3)
Sodium: 138 mmol/L (ref 135–146)

## 2015-07-03 ENCOUNTER — Telehealth: Payer: Self-pay | Admitting: Family Medicine

## 2015-07-03 ENCOUNTER — Encounter: Payer: Self-pay | Admitting: Family Medicine

## 2015-07-03 NOTE — Telephone Encounter (Signed)
Called patient to discuss results.  Potassium and glucose normal.  TSH normal.  Sleep study ordered and Nutrition referral placed at appointment.  Patient to follow up as scheduled.  Will send letter with results as well.  [place] office may relay information to patient if she calls back.  Results for orders placed or performed in visit on 07/01/15 (from the past 72 hour(s))  BASIC METABOLIC PANEL WITH GFR     Status: Abnormal   Collection Time: 07/01/15 12:19 PM  Result Value Ref Range   Sodium 138 135 - 146 mmol/L   Potassium 4.1 3.5 - 5.3 mmol/L   Chloride 103 98 - 110 mmol/L   CO2 26 20 - 31 mmol/L   Glucose, Bld 85 65 - 99 mg/dL   BUN 15 7 - 25 mg/dL   Creat 0.79 0.50 - 1.10 mg/dL   Calcium 8.5 (L) 8.6 - 10.2 mg/dL   GFR, Est African American >89 >=60 mL/min   GFR, Est Non African American >89 >=60 mL/min    Comment:   The estimated GFR is a calculation valid for adults (>=15 years old) that uses the CKD-EPI algorithm to adjust for age and sex. It is   not to be used for children, pregnant women, hospitalized patients,    patients on dialysis, or with rapidly changing kidney function. According to the NKDEP, eGFR >89 is normal, 60-89 shows mild impairment, 30-59 shows moderate impairment, 15-29 shows severe impairment and <15 is ESRD.     TSH     Status: None   Collection Time: 07/01/15 12:19 PM  Result Value Ref Range   TSH 1.13 mIU/L    Comment:   Reference Range   > or = 20 Years  0.40-4.50   Pregnancy Range First trimester  0.26-2.66 Second trimester 0.55-2.73 Third trimester  0.43-2.91       Ashly M. Lajuana Ripple, DO PGY-2, [place]

## 2015-08-04 ENCOUNTER — Ambulatory Visit (INDEPENDENT_AMBULATORY_CARE_PROVIDER_SITE_OTHER): Payer: BLUE CROSS/BLUE SHIELD | Admitting: Family Medicine

## 2015-08-04 ENCOUNTER — Telehealth: Payer: Self-pay | Admitting: Family Medicine

## 2015-08-04 ENCOUNTER — Ambulatory Visit (HOSPITAL_COMMUNITY)
Admission: RE | Admit: 2015-08-04 | Discharge: 2015-08-04 | Disposition: A | Discharge status: Home / Self Care | Payer: BLUE CROSS/BLUE SHIELD | Source: Ambulatory Visit | Attending: Family Medicine | Admitting: Family Medicine

## 2015-08-04 ENCOUNTER — Encounter: Payer: Self-pay | Admitting: Family Medicine

## 2015-08-04 VITALS — BP 123/46 | HR 72 | Temp 97.8°F | Ht 67.0 in | Wt 335.5 lb

## 2015-08-04 DIAGNOSIS — R109 Unspecified abdominal pain: Secondary | ICD-10-CM

## 2015-08-04 DIAGNOSIS — K429 Umbilical hernia without obstruction or gangrene: Secondary | ICD-10-CM

## 2015-08-04 LAB — POCT URINE PREGNANCY: PREG TEST UR: NEGATIVE

## 2015-08-04 MED ORDER — DIATRIZOATE MEGLUMINE & SODIUM 66-10 % PO SOLN
30.0000 mL | Freq: Once | ORAL | Status: AC
  Administered 2015-08-04: 30 mL via ORAL

## 2015-08-04 MED ORDER — TRAMADOL HCL 50 MG PO TABS: 50.0000 mg | ORAL_TABLET | Freq: Three times a day (TID) | ORAL | Status: DC | PRN

## 2015-08-04 MED ORDER — IOPAMIDOL (ISOVUE-300) INJECTION 61%
100.0000 mL | Freq: Once | INTRAVENOUS | Status: AC | PRN
  Administered 2015-08-04: 100 mL via INTRAVENOUS

## 2015-08-04 NOTE — Patient Instructions (Signed)
Getting CT scan of your abdomen today  Scheduled you surgery appointment on Monday - please attend this  If you have worsening pain, fever, vomiting, or are unable to eat/drink please go to the ED between now and Monday. I will call you as soon as I have your CT scan results.  Be well, Dr. Eulas PostMcIntyre   Hernia, Adult A hernia is the bulging of an organ or tissue through a weak spot in the muscles of the abdomen (abdominal wall). Hernias develop most often near the navel or groin. There are many kinds of hernias. Common kinds include:  Femoral hernia. This kind of hernia develops under the groin in the upper thigh area.  Inguinal hernia. This kind of hernia develops in the groin or scrotum.  Umbilical hernia. This kind of hernia develops near the navel.  Hiatal hernia. This kind of hernia causes part of the stomach to be pushed up into the chest.  Incisional hernia. This kind of hernia bulges through a scar from an abdominal surgery. CAUSES This condition may be caused by:  Heavy lifting.  Coughing over a long period of time.  Straining to have a bowel movement.  An incision made during an abdominal surgery.  A birth defect (congenital defect).  Excess weight or obesity.  Smoking.  Poor nutrition.  Cystic fibrosis.  Excess fluid in the abdomen.  Undescended testicles. SYMPTOMS Symptoms of a hernia include:  A lump on the abdomen. This is the first sign of a hernia. The lump may become more obvious with standing, straining, or coughing. It may get bigger over time if it is not treated or if the condition causing it is not treated.  Pain. A hernia is usually painless, but it may become painful over time if treatment is delayed. The pain is usually dull and may get worse with standing or lifting heavy objects. Sometimes a hernia gets tightly squeezed in the weak spot (strangulated) or stuck there (incarcerated) and causes additional symptoms. These symptoms may  include:  Vomiting.  Nausea.  Constipation.  Irritability. DIAGNOSIS A hernia may be diagnosed with:  A physical exam. During the exam your health care provider may ask you to cough or to make a specific movement, because a hernia is usually more visible when you move.  Imaging tests. These can include:  X-rays.  Ultrasound.  CT scan. TREATMENT A hernia that is small and painless may not need to be treated. A hernia that is large or painful may be treated with surgery. Inguinal hernias may be treated with surgery to prevent incarceration or strangulation. Strangulated hernias are always treated with surgery, because lack of blood to the trapped organ or tissue can cause it to die. Surgery to treat a hernia involves pushing the bulge back into place and repairing the weak part of the abdomen. HOME CARE INSTRUCTIONS  Avoid straining.  Do not lift anything heavier than 10 lb (4.5 kg).  Lift with your leg muscles, not your back muscles. This helps avoid strain.  When coughing, try to cough gently.  Prevent constipation. Constipation leads to straining with bowel movements, which can make a hernia worse or cause a hernia repair to break down. You can prevent constipation by:  Eating a high-fiber diet that includes plenty of fruits and vegetables.  Drinking enough fluids to keep your urine clear or pale yellow. Aim to drink 6-8 glasses of water per day.  Using a stool softener as directed by your health care provider.  Lose weight,  if you are overweight.  Do not use any tobacco products, including cigarettes, chewing tobacco, or electronic cigarettes. If you need help quitting, ask your health care provider.  Keep all follow-up visits as directed by your health care provider. This is important. Your health care provider may need to monitor your condition. SEEK MEDICAL CARE IF:  You have swelling, redness, and pain in the affected area.  Your bowel habits change. SEEK  IMMEDIATE MEDICAL CARE IF:  You have a fever.  You have abdominal pain that is getting worse.  You feel nauseous or you vomit.  You cannot push the hernia back in place by gently pressing on it while you are lying down.  The hernia:  Changes in shape or size.  Is stuck outside the abdomen.  Becomes discolored.  Feels hard or tender.   This information is not intended to replace advice given to you by your health care provider. Make sure you discuss any questions you have with your health care provider.   Document Released: 04/02/2005 Document Revised: 04/23/2014 Document Reviewed: 02/10/2014 Elsevier Interactive Patient Education [employer]

## 2015-08-04 NOTE — Progress Notes (Signed)
Date of Visit: 08/04/2015   HPI:  Patient presents for a same day appointment to discuss abdominal pain.  Reports in the past being told she might have a hernia in her umbilical area. Typically has occasional pain in this area that lasts about 1 day and resolves with over the counter medications.   Since Sunday she has had pain in this area continuously. Had decreased PO that day but since then has been eating and drinking normally. No fever or vomiting. Stooling and urinating normally. Denies history of abdominal surgeries in the past. Has tried over the counter pain medicines without relief. Works as a Marketing executiveshift manager at [employer] and has been unable to bend down or lift things since this began as it worsens the pain.  LMP was about 1.5 to 2 months ago. Periods are usually irregular. Sexually active, not on anything for birth control. Does not use condoms.  ROS: See HPI  PMFSH: history of menorrhagia, varicose veins, obesity, hypertension   PHYSICAL EXAM: BP 123/46 mmHg  Pulse 72  Temp(Src) 97.8 F (36.6 C) (Oral)  Ht 5\' 7"  (1.702 m)  Wt 335 lb 8 oz (152.182 kg)  BMI 52.53 kg/m2 Gen: NAD, pleasant, cooperative HEENT: normocephalic, atraumatic, moist mucous membranes  Heart: regular rate and rhythm, no murmur Lungs: clear to auscultation bilaterally, normal work of breathing  Abdomen: soft, obese. No peritoneal signs. Tender to palpation with fullness in umbilical area on R side. No reducible masses palpated. Morbid obesity limits examination. Neuro: alert, grossly nonfocal, speech normal.  ASSESSMENT/PLAN:  30 yo morbidly obese F presenting with 4 days of continous periumbilical abdominal pain in setting of prior dx with umbilical hernia. She has tenderness and fullness in this area but due to her morbid obesity I am not able to definitively determine if she has incarcerated bowel. She is systemically well and has no peritoneal signs, tolerating PO intake without vomiting, and moving  bowels well so I strongly doubt obstruction or strangulation right now. Discussed with on call surgical PA Aundra MilletMegan Dort who recommends getting urgent CT of abdomen to evaluate hernia site. Unfortunately [facility] does not have appointments available either today or tomorrow, so the earliest she'll be able to be seen in the office is next week. Appointment scheduled for Monday 08/08/15 at 10:45am.  Will proceed with stat CT of abdomen/pelvis today and if it shows any signs of incarceration or strangulation, will have patient either go to ED or be directly admitted to the hospital for surgical consultation. I will call her with results (her cell # is [phone]). She is stable for outpatient imaging at this time. Plan discussed with patient and she is agreeable. Discussed ED precautions and when to seek emergency care, ie if she develops fever, vomiting, inability to take PO, worsening pain, etc.   FOLLOW UP: Follow up in surgery clinic on Monday More urgent follow up to be considered pending CT scan of abdomen  GrenadaBrittany J. Pollie MeyerMcIntyre, MD [facility]

## 2015-08-04 NOTE — Telephone Encounter (Signed)
CT scan shows large hernia containing fat but no bowel. Discussed with on call surgeon Dr. Gaynelle AduEric Wilson who confirms this is not a surgical emergency and that patient can follow up with the general surgery clinic as an outpatient as scheduled on Monday.  Called patient & discussed results. She would like something for pain. I called in tramadol for her (she confirms no history of seizures in the past). Again reviewed reasons to seek emergency care.  Patient appreciative.   Latrelle DodrillBrittany J Antonette Hendricks, MD

## 2015-08-08 ENCOUNTER — Telehealth: Payer: Self-pay | Admitting: *Deleted

## 2015-08-08 ENCOUNTER — Ambulatory Visit: Payer: Self-pay | Admitting: General Surgery

## 2015-08-08 DIAGNOSIS — K439 Ventral hernia without obstruction or gangrene: Secondary | ICD-10-CM | POA: Diagnosis not present

## 2015-08-08 NOTE — Telephone Encounter (Signed)
Patient states she took the tramadol prescribed and did not like the way it made her feel (states she felt 'out of it" and nauseous). Does not want to continue this medication, wants to know if MD can call in something different.

## 2015-08-08 NOTE — H&P (Signed)
[name] 08/08/2015 10:55 AM Location: [facility] Patient #: [id number] DOB: 1985-12-18 Single / Language: [place] / Race: Black or African American Female  History of Present Illness Adolph Pollack MD; 08/08/2015 11:29 AM) Patient words: New pt- UMB hernia.  The patient is a 30 year old female.   Note:She is referred by Dr. Clifton Custard for consultation regarding a large abdominal wall hernia. She began having some periumbilical pain and went to see Dr. Pollie Meyer who was suspicious of a hernia. CT scan demonstrated a large, 7 cm, periumbilical hernia containing fat. No evidence of obstruction. Some edema of the fat was noted. This was done 4 days ago. It was felt she could wait to be seen as an outpatient. She currently is pain-free. She is now careful with respect to bending over picking things up which is what usually caused her pain. No obstruction type symptoms currently. No previous abdominal operations. Recent pregnancy test was negative. She works as a Marketing executive at [employer].  Other Problems Robet Leu, LPN; 0/45/4098 11:91 AM) Back Pain High blood pressure Umbilical Hernia Repair  Diagnostic Studies History Robet Leu, LPN; 4/78/2956 21:30 AM) Colonoscopy never Mammogram never Pap Smear 1-5 years ago  Allergies Robet Leu, LPN; 8/65/7846 96:29 AM) No Known Drug Allergies 08/08/2015  Medication History Robet Leu, LPN; 09/11/4130 44:01 AM) HydroCHLOROthiazide (12.5MG  Capsule, Oral daily) Active. Medications Reconciled  Social History Robet Leu, LPN; 0/27/2536 64:40 AM) Alcohol use Occasional alcohol use. Caffeine use Carbonated beverages. No drug use Tobacco use Never smoker.  Family History Robet Leu, LPN; 3/47/4259 56:38 AM) Arthritis Mother, Sister. Hypertension Mother, Sister. Ovarian Cancer Sister.  Pregnancy / Birth History Robet Leu, LPN; 7/56/4332 95:18  AM) Age at menarche 12 years. Gravida 0 Irregular periods Para 0     Review of Systems Cala Bradford F. Turpin LPN; 8/41/6606 30:16 AM) General Not Present- Appetite Loss, Chills, Fatigue, Fever, Night Sweats, Weight Gain and Weight Loss. Skin Not Present- Change in Wart/Mole, Dryness, Hives, Jaundice, New Lesions, Non-Healing Wounds, Rash and Ulcer. HEENT Present- Seasonal Allergies. Not Present- Earache, Hearing Loss, Hoarseness, Nose Bleed, Oral Ulcers, Ringing in the Ears, Sinus Pain, Sore Throat, Visual Disturbances, Wears glasses/contact lenses and Yellow Eyes. Respiratory Present- Snoring. Not Present- Bloody sputum, Chronic Cough, Difficulty Breathing and Wheezing. Breast Not Present- Breast Mass, Breast Pain, Nipple Discharge and Skin Changes. Cardiovascular Present- Leg Cramps. Not Present- Chest Pain, Difficulty Breathing Lying Down, Palpitations, Rapid Heart Rate, Shortness of Breath and Swelling of Extremities. Gastrointestinal Present- Abdominal Pain. Not Present- Bloating, Bloody Stool, Change in Bowel Habits, Chronic diarrhea, Constipation, Difficulty Swallowing, Excessive gas, Gets full quickly at meals, Hemorrhoids, Indigestion, Nausea, Rectal Pain and Vomiting. Female Genitourinary Not Present- Frequency, Nocturia, Painful Urination, Pelvic Pain and Urgency. Musculoskeletal Present- Back Pain. Not Present- Joint Pain, Joint Stiffness, Muscle Pain, Muscle Weakness and Swelling of Extremities. Neurological Not Present- Decreased Memory, Fainting, Headaches, Numbness, Seizures, Tingling, Tremor, Trouble walking and Weakness. Psychiatric Not Present- Anxiety, Bipolar, Change in Sleep Pattern, Depression, Fearful and Frequent crying. Endocrine Not Present- Cold Intolerance, Excessive Hunger, Hair Changes, Heat Intolerance, Hot flashes and New Diabetes. Hematology Present- Easy Bruising. Not Present- Excessive bleeding, Gland problems, HIV and Persistent Infections.  Vitals  Cala Bradford F. Turpin LPN; 0/01/9322 55:73 AM) 08/08/2015 10:58 AM Weight: 339 lb Height: 66.5in Body Surface Area: 2.52 m Body Mass Index: 53.9 kg/m  Temp.: 98.58F(Oral)  Pulse: 88 (Regular)  BP: 120/78 (Sitting, Left Arm, Standard)  Physical Exam Adolph Pollack(Jaydin Jalomo J. Sofija Antwi MD; 08/08/2015 11:30 AM)  The physical exam findings are as follows: Note:General: Morbidly obese female in NAD. Pleasant and cooperative.  HEENT: [place]/AT, no facial masses  EYES: EOMI, no icterus  CV: RRR, no murmur, no JVD.  CHEST: Breath sounds equal and clear. Respirations nonlabored.  BREASTS: Symmetrical in size. No dominant masses, nipple discharge or suspicious skin lesions.  ABDOMEN: Soft, obese, moderate to large umbilical bulge extending superiorly that is mildly tender to palpation.  NEUROLOGIC: Alert and oriented, answers questions appropriately, normal gait and station.  PSYCHIATRIC: Normal mood, affect , and behavior.    Assessment & Plan Adolph Pollack(Darrill Vreeland J. Laren Whaling MD; 08/08/2015 11:36 AM)  VENTRAL HERNIA WITHOUT OBSTRUCTION OR GANGRENE (K43.9) Impression: This is a moderately large hernia that is symptomatic with certain movements. No symptoms of obstruction at this time. I recommended laparoscopic repair with mesh and she is in agreement with this.  Laparoscopic ventral hernia repair with mesh. I have discussed the procedure, risks, and aftercare. Risks include but are not limited to bleeding, infection, wound healing problems, anesthesia, recurrence, accidental injury to intra-abdominal organs. We also discussed the rare complication of mesh rejection. All questions were answered. She seems to understand and agrees with the plan. I feel she can return to work but should not lift anything over 10 pounds.  Avel Peaceodd Nilam Quakenbush, MD

## 2015-08-09 NOTE — Telephone Encounter (Signed)
There is not really a better medication that is stronger than tylenol/ibuprofen for pain that won't make her feel sleepy and out of it. The only other medications are narcotics. Please inform patient.   Latrelle DodrillBrittany J Trinna Kunst, MD

## 2015-08-12 NOTE — Telephone Encounter (Signed)
LMOVM for pt to call us back. Deseree Blount, CMA  

## 2015-08-17 NOTE — Telephone Encounter (Signed)
Left another message for patient to return call.

## 2015-09-12 ENCOUNTER — Ambulatory Visit (HOSPITAL_BASED_OUTPATIENT_CLINIC_OR_DEPARTMENT_OTHER): Payer: BLUE CROSS/BLUE SHIELD | Attending: Family Medicine

## 2015-09-20 ENCOUNTER — Other Ambulatory Visit: Payer: Self-pay | Admitting: *Deleted

## 2015-09-20 DIAGNOSIS — I10 Essential (primary) hypertension: Secondary | ICD-10-CM

## 2015-09-20 NOTE — Telephone Encounter (Signed)
Wants a refill on her medicine and she wants something prescribe for her back pain, tramadol makes her sick. Deseree Bruna PotterBlount, CMA

## 2015-09-21 MED ORDER — HYDROCHLOROTHIAZIDE 12.5 MG PO TABS: 12.5000 mg | ORAL_TABLET | Freq: Every day | ORAL | Status: DC

## 2015-09-21 NOTE — Telephone Encounter (Signed)
Will need to schedule office visit for back pain. HCTZ refilled.

## 2015-09-22 NOTE — Telephone Encounter (Signed)
LVm for pt to call back to inform about below and see if she wants to schedule an appointment for back pain. Lamonte SakaiZimmerman Rumple, April D, [place]

## 2015-09-24 ENCOUNTER — Encounter (HOSPITAL_COMMUNITY): Payer: Self-pay | Admitting: *Deleted

## 2015-09-24 ENCOUNTER — Emergency Department (HOSPITAL_COMMUNITY): Payer: BLUE CROSS/BLUE SHIELD

## 2015-09-24 ENCOUNTER — Emergency Department (HOSPITAL_COMMUNITY)
Admission: EM | Admit: 2015-09-24 | Discharge: 2015-09-24 | Disposition: A | Discharge status: Home / Self Care | Payer: BLUE CROSS/BLUE SHIELD | Attending: Emergency Medicine | Admitting: Emergency Medicine

## 2015-09-24 DIAGNOSIS — R1033 Periumbilical pain: Secondary | ICD-10-CM | POA: Diagnosis not present

## 2015-09-24 DIAGNOSIS — I10 Essential (primary) hypertension: Secondary | ICD-10-CM | POA: Diagnosis not present

## 2015-09-24 DIAGNOSIS — Z79899 Other long term (current) drug therapy: Secondary | ICD-10-CM | POA: Insufficient documentation

## 2015-09-24 DIAGNOSIS — Z6841 Body Mass Index (BMI) 40.0 and over, adult: Secondary | ICD-10-CM | POA: Insufficient documentation

## 2015-09-24 DIAGNOSIS — R109 Unspecified abdominal pain: Secondary | ICD-10-CM

## 2015-09-24 DIAGNOSIS — E669 Obesity, unspecified: Secondary | ICD-10-CM | POA: Diagnosis not present

## 2015-09-24 LAB — COMPREHENSIVE METABOLIC PANEL WITH GFR
ALT: 15 U/L (ref 14–54)
AST: 18 U/L (ref 15–41)
Albumin: 3.4 g/dL — ABNORMAL LOW (ref 3.5–5.0)
Alkaline Phosphatase: 42 U/L (ref 38–126)
Anion gap: 5 (ref 5–15)
BUN: 11 mg/dL (ref 6–20)
CO2: 28 mmol/L (ref 22–32)
Calcium: 8.7 mg/dL — ABNORMAL LOW (ref 8.9–10.3)
Chloride: 106 mmol/L (ref 101–111)
Creatinine, Ser: 0.81 mg/dL (ref 0.44–1.00)
GFR calc Af Amer: >60 mL/min
GFR calc non Af Amer: >60 mL/min
Glucose, Bld: 80 mg/dL (ref 65–99)
Potassium: 3.5 mmol/L (ref 3.5–5.1)
Sodium: 139 mmol/L (ref 135–145)
Total Bilirubin: 0.5 mg/dL (ref 0.3–1.2)
Total Protein: 6.6 g/dL (ref 6.5–8.1)

## 2015-09-24 LAB — I-STAT BETA HCG BLOOD, ED (MC, WL, AP ONLY)

## 2015-09-24 LAB — URINALYSIS, ROUTINE W REFLEX MICROSCOPIC
Bilirubin Urine: NEGATIVE
GLUCOSE, UA: NEGATIVE mg/dL
Hgb urine dipstick: NEGATIVE
KETONES UR: NEGATIVE mg/dL
LEUKOCYTES UA: NEGATIVE
NITRITE: NEGATIVE
PROTEIN: NEGATIVE mg/dL
Specific Gravity, Urine: 1.028 (ref 1.005–1.030)
pH: 6 (ref 5.0–8.0)

## 2015-09-24 LAB — CBC
HCT: 36.9 % (ref 36.0–46.0)
Hemoglobin: 11.6 g/dL — ABNORMAL LOW (ref 12.0–15.0)
MCH: 28 pg (ref 26.0–34.0)
MCHC: 31.4 g/dL (ref 30.0–36.0)
MCV: 88.9 fL (ref 78.0–100.0)
Platelets: 281 K/uL (ref 150–400)
RBC: 4.15 MIL/uL (ref 3.87–5.11)
RDW: 13.8 % (ref 11.5–15.5)
WBC: 5.1 K/uL (ref 4.0–10.5)

## 2015-09-24 LAB — LIPASE, BLOOD: Lipase: 17 U/L (ref 11–51)

## 2015-09-24 LAB — LACTIC ACID, PLASMA: LACTIC ACID, VENOUS: 1.5 mmol/L (ref 0.5–2.0)

## 2015-09-24 MED ORDER — IOPAMIDOL (ISOVUE-300) INJECTION 61%
INTRAVENOUS | Status: AC
  Administered 2015-09-24: 100 mL
  Filled 2015-09-24: qty 100

## 2015-09-24 MED ORDER — IBUPROFEN 800 MG PO TABS: 800.0000 mg | ORAL_TABLET | Freq: Three times a day (TID) | ORAL | Status: DC | PRN

## 2015-09-24 MED ORDER — KETOROLAC TROMETHAMINE 15 MG/ML IJ SOLN
15.0000 mg | Freq: Once | INTRAMUSCULAR | Status: AC
  Administered 2015-09-24: 15 mg via INTRAVENOUS
  Filled 2015-09-24: qty 1

## 2015-09-24 MED ORDER — SODIUM CHLORIDE 0.9 % IV BOLUS (SEPSIS)
1000.0000 mL | Freq: Once | INTRAVENOUS | Status: AC
  Administered 2015-09-24: 1000 mL via INTRAVENOUS

## 2015-09-24 MED ORDER — FENTANYL CITRATE (PF) 100 MCG/2ML IJ SOLN
50.0000 ug | Freq: Once | INTRAMUSCULAR | Status: AC
  Administered 2015-09-24: 50 ug via INTRAVENOUS
  Filled 2015-09-24: qty 2

## 2015-09-24 MED ORDER — ONDANSETRON HCL 4 MG/2ML IJ SOLN
4.0000 mg | Freq: Once | INTRAMUSCULAR | Status: AC
  Administered 2015-09-24: 4 mg via INTRAVENOUS
  Filled 2015-09-24: qty 2

## 2015-09-24 NOTE — Discharge Instructions (Signed)

## 2015-09-24 NOTE — ED Notes (Signed)
Pt at CT

## 2015-09-24 NOTE — ED Notes (Signed)
Pt ambulatory w/ steady gait to restroom. 

## 2015-09-24 NOTE — ED Notes (Signed)
Pt called to be placed in room. Pt did not answer

## 2015-09-24 NOTE — ED Notes (Signed)
Pt reports having abd hernia and is scheduled for surgery on the 30th. Had increase in pain today around 11:00. Denies vomiting or diarrhea.

## 2015-09-24 NOTE — ED Provider Notes (Signed)
CSN: [id number]     Arrival date & time 09/24/15  1451 History   First MD Initiated Contact with Patient 09/24/15 1540     Chief Complaint  Patient presents with  . Abdominal Pain   Patient is a 30 y.o. female presenting with abdominal pain.  Abdominal Pain Pain location:  Periumbilical (at hernia site) Pain quality: sharp   Pain radiates to:  Does not radiate Pain severity:  Severe (when the hernia comes out it hurts, sharp) Onset quality:  Sudden Timing:  Intermittent Progression:  Unchanged Chronicity:  New Context: not previous surgeries   Context comment:  No abdominal surgeries Relieved by:  Nothing Worsened by:  Nothing tried Ineffective treatments:  None tried Associated symptoms: anorexia   Associated symptoms: no chest pain, no constipation, no cough, no diarrhea, no dysuria, no fever, no hematemesis, no hematochezia, no hematuria, no shortness of breath, no vaginal bleeding, no vaginal discharge and no vomiting   Risk factors: obesity   Risk factors: has not had multiple surgeries and not pregnant    Past Medical History  Diagnosis Date  . Obesity   . Varicose veins   . Ankle ulcer (HCC)   . 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 Hypertension    Past Surgical History  Procedure Laterality Date  . Tonsillectomy    . Tympanostomy tube placement    . Vein ligation and stripping Right 12/15/2014    Procedure: SAPHENOUS VEIN LIGATION AND STRIPPING; GREATER THAN 20 STAB PHLEBECTOMIES;  Surgeon: Larina Earthly, MD;  Location: [facility] OR;  Service: Vascular;  Laterality: Right;   Family History  Problem Relation Age of Onset  . Diabetes Mother   . Varicose Veins Mother   . Varicose Veins Sister    Social History  Substance Use Topics  . Smoking status: Never Smoker   . Smokeless tobacco: Never Used  . Alcohol Use: 0.6 oz/week    1 Glasses of wine per week     Comment: occasional   OB History    No data available     Review  of Systems  Constitutional: Negative for fever.  Respiratory: Negative for cough and shortness of breath.   Cardiovascular: Negative for chest pain.  Gastrointestinal: Positive for abdominal pain and anorexia. Negative for vomiting, diarrhea, constipation, hematochezia and hematemesis.  Genitourinary: Negative for dysuria, hematuria, vaginal bleeding and vaginal discharge.   Allergies  Review of patient's allergies indicates no known allergies.  Home Medications   Prior to Admission medications   Medication Sig Start Date End Date Taking? Authorizing Provider  hydrochlorothiazide (HYDRODIURIL) 12.5 MG tablet Take 1 tablet (12.5 mg total) by mouth daily. 09/21/15   Mayo N Rumley, DO  ibuprofen (ADVIL,MOTRIN) 800 MG tablet Take 1 tablet (800 mg total) by mouth every 8 (eight) hours as needed for moderate pain. 09/24/15   Sidney Ace, MD  loratadine (CLARITIN) 10 MG tablet Take 1 tablet (10 mg total) by mouth daily. 07/01/15   Ashly Hulen Skains, DO  pantoprazole (PROTONIX) 40 MG tablet TAKE 1 TABLET (40 MG TOTAL) BY MOUTH DAILY. 05/09/15   Ashly Hulen Skains, DO  traMADol (ULTRAM) 50 MG tablet Take 1 tablet (50 mg total) by mouth every 8 (eight) hours as needed for severe pain. 08/04/15   [name], MD   BP 118/60 mmHg  Pulse 80  Temp(Src) 98.4 F (36.9 C) (Oral)  Resp 20  Ht  5\' 6"  (1.676 m)  Wt 153.769 kg  BMI 54.74 kg/m2  SpO2 100%  LMP 09/18/2015 Physical Exam  Constitutional: She is oriented to person, place, and time. She appears well-developed and well-nourished. No distress.  HENT:  Head: Normocephalic and atraumatic.  Eyes: Conjunctivae are normal. Right eye exhibits no discharge. Left eye exhibits no discharge.  Neck: Normal range of motion. Neck supple.  Cardiovascular: Normal rate and regular rhythm.   Pulmonary/Chest: Effort normal and breath sounds normal. No respiratory distress.  Abdominal: Soft. Bowel sounds are normal. She exhibits no distension and  no mass. There is tenderness (at hernia site, severe, pt refusses to have me attempt reduction). There is guarding. There is no rebound.  Neg cvat Neg murphys sign  Musculoskeletal: She exhibits no edema.  Neurological: She is alert and oriented to person, place, and time.  Skin: Skin is warm. No rash noted.  Psychiatric: She has a normal mood and affect.  Nursing note and vitals reviewed.   ED Course  Procedures  Labs Review Labs Reviewed  COMPREHENSIVE METABOLIC PANEL - Abnormal; Notable for the following:    Calcium 8.7 (*)    Albumin 3.4 (*)    All other components within normal limits  CBC - Abnormal; Notable for the following:    Hemoglobin 11.6 (*)    All other components within normal limits  LIPASE, BLOOD  URINALYSIS, ROUTINE W REFLEX MICROSCOPIC (NOT AT [facility])  LACTIC ACID, PLASMA  I-STAT BETA HCG BLOOD, ED ([facility], [facility], [facility] ONLY)    Imaging Review Ct Abdomen Pelvis W Contrast  09/24/2015  CLINICAL DATA:  Periumbilical abdominal pain, chronic, with recent worsening. EXAM: CT ABDOMEN AND PELVIS WITH CONTRAST TECHNIQUE: Multidetector CT imaging of the abdomen and pelvis was performed using the standard protocol following bolus administration of intravenous contrast. CONTRAST:  100 cc ISOVUE-300 IOPAMIDOL (ISOVUE-300) INJECTION 61% COMPARISON:  08/04/2015 CT abdomen/ pelvis. FINDINGS: Lower chest: No significant pulmonary nodules or acute consolidative airspace disease. Mild cardiomegaly, stable. Hepatobiliary: Normal liver with no liver mass. Normal gallbladder with no radiopaque cholelithiasis. No biliary ductal dilatation. Pancreas: Normal, with no mass or duct dilation. Spleen: Normal size. No mass. Adrenals/Urinary Tract: Normal adrenals. No hydronephrosis. No renal masses. Normal bladder. Stomach/Bowel: Grossly normal stomach. Normal caliber small bowel with no small bowel wall thickening. Normal appendix. Normal large bowel with no diverticulosis, large bowel wall thickening or  pericolonic fat stranding. Vascular/Lymphatic: Normal caliber abdominal aorta. Patent portal, splenic and renal veins. No pathologically enlarged lymph nodes in the abdomen or pelvis. Reproductive: Grossly normal uterus.  No adnexal mass. Other: No pneumoperitoneum, ascites or focal fluid collection. There is a complex moderate fat containing supraumbilical hernia, which may represent two subjacent fat containing hernias as seen on sagittal sequence series 6/images 126 and 118, and which contains a minimal amount of trapped fluid, and is not appreciably changed in size since 08/04/2015. No bowel loops within the hernia sac. Musculoskeletal: No aggressive appearing focal osseous lesions. Healed deformity in the left anterior ninth rib. Moderate degenerative disc disease at L5-S1. IMPRESSION: 1. Complex moderate fat containing supraumbilical hernia, not appreciably changed since 08/04/2015, which appears to represent two subjacent fat containing hernias. 2. No evidence of bowel obstruction or acute bowel inflammation. Normal appendix. 3. Stable mild cardiomegaly. Electronically Signed   By: Delbert Phenix M.D.   On: 09/24/2015 18:43   I have personally reviewed and evaluated these images and lab results as part of my medical decision-making.   EKG Interpretation None  MDM   Final diagnoses:  Abdominal pain, unspecified abdominal location   After Toradol, fentanyl, patient allowed manual reduction of hernia. However, patient was having significant pain and CT of her abdomen obtained which did not reveal any appendicitis, incarceration or other concerning findings. Labs reviewed and unremarkable. Patient instructed to self reduce her hernia in the future. She is pending surgery on the 30th of this month. Doubt acute abdomen, doubt renal stone, doubt cystitis. Patient denies any vaginal symptoms. Patient felt better and is to follow-up with surgeon. Return for worsening severe abdominal pain, intractable  vomiting, or other concerning symptoms.   Sidney AceAlison Charruf Zalman Hull, MD 09/25/15 0140  Alvira MondayErin Schlossman, MD 09/25/15 1258

## 2015-09-27 NOTE — Telephone Encounter (Signed)
Spoke with patient and she needs to check her schedule and then she will call back.  Kris Burd,CMA

## 2015-10-06 ENCOUNTER — Encounter (HOSPITAL_COMMUNITY)
Admission: RE | Admit: 2015-10-06 | Discharge: 2015-10-06 | Disposition: A | Discharge status: Home / Self Care | Payer: BLUE CROSS/BLUE SHIELD | Source: Ambulatory Visit | Attending: General Surgery | Admitting: General Surgery

## 2015-10-06 ENCOUNTER — Encounter (HOSPITAL_COMMUNITY): Payer: Self-pay

## 2015-10-06 DIAGNOSIS — Z01818 Encounter for other preprocedural examination: Secondary | ICD-10-CM | POA: Insufficient documentation

## 2015-10-06 DIAGNOSIS — Z79899 Other long term (current) drug therapy: Secondary | ICD-10-CM | POA: Diagnosis not present

## 2015-10-06 DIAGNOSIS — Z01812 Encounter for preprocedural laboratory examination: Secondary | ICD-10-CM | POA: Diagnosis not present

## 2015-10-06 DIAGNOSIS — I10 Essential (primary) hypertension: Secondary | ICD-10-CM | POA: Diagnosis not present

## 2015-10-06 DIAGNOSIS — R9431 Abnormal electrocardiogram [ECG] [EKG]: Secondary | ICD-10-CM | POA: Insufficient documentation

## 2015-10-06 DIAGNOSIS — K439 Ventral hernia without obstruction or gangrene: Secondary | ICD-10-CM | POA: Insufficient documentation

## 2015-10-06 LAB — CBC WITH DIFFERENTIAL/PLATELET
BASOS PCT: 1 %
Basophils Absolute: 0 10*3/uL (ref 0.0–0.1)
EOS ABS: 0.1 10*3/uL (ref 0.0–0.7)
EOS PCT: 1 %
HCT: 36.8 % (ref 36.0–46.0)
HEMOGLOBIN: 11.5 g/dL — AB (ref 12.0–15.0)
LYMPHS ABS: 1.8 10*3/uL (ref 0.7–4.0)
Lymphocytes Relative: 35 %
MCH: 27.7 pg (ref 26.0–34.0)
MCHC: 31.3 g/dL (ref 30.0–36.0)
MCV: 88.7 fL (ref 78.0–100.0)
MONO ABS: 0.3 10*3/uL (ref 0.1–1.0)
MONOS PCT: 6 %
NEUTROS ABS: 3 10*3/uL (ref 1.7–7.7)
NEUTROS PCT: 57 %
PLATELETS: 248 10*3/uL (ref 150–400)
RBC: 4.15 MIL/uL (ref 3.87–5.11)
RDW: 13.6 % (ref 11.5–15.5)
WBC: 5.3 10*3/uL (ref 4.0–10.5)

## 2015-10-06 LAB — COMPREHENSIVE METABOLIC PANEL
ALK PHOS: 40 U/L (ref 38–126)
ALT: 27 U/L (ref 14–54)
ANION GAP: 6 (ref 5–15)
AST: 27 U/L (ref 15–41)
Albumin: 3.6 g/dL (ref 3.5–5.0)
BUN: 15 mg/dL (ref 6–20)
CALCIUM: 8.9 mg/dL (ref 8.9–10.3)
CO2: 27 mmol/L (ref 22–32)
Chloride: 105 mmol/L (ref 101–111)
Creatinine, Ser: 0.88 mg/dL (ref 0.44–1.00)
GFR calc non Af Amer: >60 mL/min (ref >60)
Glucose, Bld: 85 mg/dL (ref 65–99)
POTASSIUM: 3.3 mmol/L — AB (ref 3.5–5.1)
SODIUM: 138 mmol/L (ref 135–145)
TOTAL PROTEIN: 6.6 g/dL (ref 6.5–8.1)
Total Bilirubin: 0.6 mg/dL (ref 0.3–1.2)

## 2015-10-06 LAB — HCG, SERUM, QUALITATIVE: Preg, Serum: NEGATIVE

## 2015-10-06 NOTE — Progress Notes (Signed)
PCP - Dr. Earl GalaAshly Gattschalk Cardiologist - denies  EKG - 10/06/15 CXR - denies Echo- 2004 Stress test/Cardiac Cath - denies  Patient denies chest pain and shortness of breath at PAT appointment.    Patient states that she is supposed to have a sleep study completed but it has not been scheduled yet.

## 2015-10-06 NOTE — Pre-Procedure Instructions (Signed)
Conley SimmondsShama J Ackers  10/06/2015      [id number] [place], [place] - [address] [facility] [id number] [place] [id number] Phone: [phone] Fax: [phone]    Your procedure is scheduled on Friday, June 30th, 2017.  [facility].M.   Call this number if you have problems the morning of surgery:  [phone]   Remember:  Do not eat food or drink liquids after midnight.   Take these medicines the morning of surgery with A SIP OF WATER: None.  7 days prior to surgery, stop taking: Ibuprofen, Advil, Motrin, Aspirin, NSAIDS, Aleve, Naproxen, BC's, Goody's, Fish oil, all herbal medications, and all vitamins.    Do not wear jewelry, make-up or nail polish.  Do not wear lotions, powders, or perfumes.    Do not shave 48 hours prior to surgery.    Do not bring valuables to the hospital.  [facility] is not responsible for any belongings or valuables.  Contacts, dentures or bridgework may not be worn into surgery.  Leave your suitcase in the car.  After surgery it may be brought to your room.  For patients admitted to the hospital, discharge time will be determined by your treatment team.  Patients discharged the day of surgery will not be allowed to drive home.   Special instructions:  Preparing for Surgery  Please read over the following fact sheets that you were given.   [facility]- Preparing For Surgery  Before surgery, you can play an important role. Because skin is not sterile, your skin needs to be as free of germs as possible. You can reduce the number of germs on your skin by washing with CHG (chlorahexidine gluconate) Soap before surgery.  CHG is an antiseptic cleaner which kills germs and bonds with the skin to continue killing germs even after washing.  Please do not use if you have an allergy to CHG or antibacterial soaps. If your skin becomes reddened/irritated stop using  the CHG.  Do not shave (including legs and underarms) for at least 48 hours prior to first CHG shower. It is OK to shave your face.  Please follow these instructions carefully.   1. Shower the NIGHT BEFORE SURGERY and the MORNING OF SURGERY with CHG.   2. If you chose to wash your hair, wash your hair first as usual with your normal shampoo.  3. After you shampoo, rinse your hair and body thoroughly to remove the shampoo.  4. Use CHG as you would any other liquid soap. You can apply CHG directly to the skin and wash gently with a scrungie or a clean washcloth.   5. Apply the CHG Soap to your body ONLY FROM THE NECK DOWN.  Do not use on open wounds or open sores. Avoid contact with your eyes, ears, mouth and genitals (private parts). Wash genitals (private parts) with your normal soap.  6. Wash thoroughly, paying special attention to the area where your surgery will be performed.  7. Thoroughly rinse your body with warm water from the neck down.  8. DO NOT shower/wash with your normal soap after using and rinsing off the CHG Soap.  9. Pat yourself dry with a CLEAN TOWEL.   10. Wear CLEAN PAJAMAS   11. Place CLEAN SHEETS on your bed the night of your first shower and DO NOT SLEEP WITH PETS.  Day of Surgery: Do not apply any deodorants/lotions. Please  wear clean clothes to the hospital/surgery center.

## 2015-10-06 NOTE — Progress Notes (Signed)
Anesthesia Chart Review:  Pt is a 30 year old female scheduled for laparoscopic ventral hernia repair, insertion of mesh on 10/14/2015 with Avel Peaceodd Rosenbower.   PMH includes:  HTN, asthma (as a child). 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 Does not see cardiology. Never smoker. BMI 55  Medications include: HCTZ  Preoperative labs reviewed.    EKG 10/06/15: NSR with sinus arrhythmia. T wave abnormality, consider inferior ischemia  Echo 07/07/2002: - The left ventricle was mildly dilated. Overall left ventricular systolic function was normal. Left ventricular ejection fraction was estimated, range being 55 % to 65 %. - There was mild aortic root dilatation. - There was a mild mitral valve prolapse. - The pulmonary veins were grossly normal. IMPRESSIONS - No evidence for bicuspid aortic valve but borderline mitral valve prolapse observed along with mild dilatation of LV and aortic root. - Disposition: Recheck in 2 years. No restrictions other than SBE prophyllaxis for dental/surgical work.  Reviewed case with Dr. Krista BlueSinger.   If no changes, I anticipate pt can proceed with surgery as scheduled.   Rica Mastngela Ronal Maybury, FNP-BC [facility] Phone: [phone] 10/06/2015 4:18 PM

## 2015-10-13 ENCOUNTER — Encounter (HOSPITAL_COMMUNITY): Payer: Self-pay | Admitting: Anesthesiology

## 2015-10-13 MED ORDER — DEXTROSE 5 % IV SOLN
3.0000 g | INTRAVENOUS | Status: AC
  Administered 2015-10-14: 3 g via INTRAVENOUS
  Filled 2015-10-13: qty 3000

## 2015-10-13 NOTE — Anesthesia Preprocedure Evaluation (Addendum)
Anesthesia Evaluation  Patient identified by MRN, date of birth, ID band Patient awake    Reviewed: Allergy & Precautions, NPO status , Patient's Chart, lab work & pertinent test results  Airway Mallampati: III  TM Distance: >3 FB Neck ROM: full    Dental  (+) Teeth Intact   Pulmonary    Pulmonary exam normal        Cardiovascular hypertension, Pt. on medications Normal cardiovascular exam     Neuro/Psych negative neurological ROS  negative psych ROS   GI/Hepatic negative GI ROS, Neg liver ROS,   Endo/Other  Morbid obesity  Renal/GU negative Renal ROS     Musculoskeletal   Abdominal (+) + obese,   Peds  Hematology negative hematology ROS (+)   Anesthesia Other Findings   Reproductive/Obstetrics negative OB ROS                          Anesthesia Physical Anesthesia Plan  ASA: III  Anesthesia Plan: General   Post-op Pain Management:    Induction: Intravenous  Airway Management Planned: Oral ETT  Additional Equipment:   Intra-op Plan:   Post-operative Plan: Extubation in OR  Informed Consent: I have reviewed the patients History and Physical, chart, labs and discussed the procedure including the risks, benefits and alternatives for the proposed anesthesia with the patient or authorized representative who has indicated his/her understanding and acceptance.     Plan Discussed with: CRNA and Surgeon  Anesthesia Plan Comments:        Anesthesia Quick Evaluation

## 2015-10-14 ENCOUNTER — Ambulatory Visit (HOSPITAL_COMMUNITY): Payer: BLUE CROSS/BLUE SHIELD | Admitting: Anesthesiology

## 2015-10-14 ENCOUNTER — Encounter (HOSPITAL_COMMUNITY): Payer: Self-pay | Admitting: *Deleted

## 2015-10-14 ENCOUNTER — Ambulatory Visit (HOSPITAL_COMMUNITY)
Admission: RE | Admit: 2015-10-14 | Discharge: 2015-10-15 | Disposition: A | Discharge status: Home / Self Care | Payer: BLUE CROSS/BLUE SHIELD | Source: Ambulatory Visit | Attending: General Surgery | Admitting: General Surgery

## 2015-10-14 ENCOUNTER — Encounter (HOSPITAL_COMMUNITY)
Admission: RE | Disposition: A | Discharge status: Home / Self Care | Payer: Self-pay | Source: Ambulatory Visit | Attending: General Surgery

## 2015-10-14 ENCOUNTER — Ambulatory Visit (HOSPITAL_COMMUNITY): Payer: BLUE CROSS/BLUE SHIELD | Admitting: Emergency Medicine

## 2015-10-14 DIAGNOSIS — K429 Umbilical hernia without obstruction or gangrene: Secondary | ICD-10-CM | POA: Diagnosis not present

## 2015-10-14 DIAGNOSIS — M545 Low back pain: Secondary | ICD-10-CM | POA: Diagnosis not present

## 2015-10-14 DIAGNOSIS — I10 Essential (primary) hypertension: Secondary | ICD-10-CM | POA: Diagnosis not present

## 2015-10-14 DIAGNOSIS — Z6841 Body Mass Index (BMI) 40.0 and over, adult: Secondary | ICD-10-CM | POA: Insufficient documentation

## 2015-10-14 DIAGNOSIS — Z79899 Other long term (current) drug therapy: Secondary | ICD-10-CM | POA: Insufficient documentation

## 2015-10-14 DIAGNOSIS — K219 Gastro-esophageal reflux disease without esophagitis: Secondary | ICD-10-CM | POA: Diagnosis not present

## 2015-10-14 DIAGNOSIS — K439 Ventral hernia without obstruction or gangrene: Secondary | ICD-10-CM | POA: Diagnosis not present

## 2015-10-14 HISTORY — DX: Low back pain: M54.5

## 2015-10-14 HISTORY — DX: Headache, unspecified: R51.9

## 2015-10-14 HISTORY — PX: LAPAROSCOPIC INCISIONAL / UMBILICAL / VENTRAL HERNIA REPAIR: SUR789

## 2015-10-14 HISTORY — PX: VENTRAL HERNIA REPAIR: SHX424

## 2015-10-14 HISTORY — DX: Unspecified asthma, uncomplicated: J45.909

## 2015-10-14 HISTORY — DX: Other chronic pain: G89.29

## 2015-10-14 HISTORY — DX: Low back pain, unspecified: M54.50

## 2015-10-14 HISTORY — DX: Gastro-esophageal reflux disease without esophagitis: K21.9

## 2015-10-14 HISTORY — DX: Ventricular septal defect: Q21.0

## 2015-10-14 HISTORY — PX: INSERTION OF MESH: SHX5868

## 2015-10-14 HISTORY — DX: Headache: R51

## 2015-10-14 SURGERY — REPAIR, HERNIA, VENTRAL, LAPAROSCOPIC
Anesthesia: General | Site: Abdomen

## 2015-10-14 MED ORDER — SUGAMMADEX SODIUM 200 MG/2ML IV SOLN
INTRAVENOUS | Status: DC | PRN
  Administered 2015-10-14: 200 mg via INTRAVENOUS
  Administered 2015-10-14: 300 mg via INTRAVENOUS

## 2015-10-14 MED ORDER — BUPIVACAINE-EPINEPHRINE 0.5% -1:200000 IJ SOLN
INTRAMUSCULAR | Status: DC | PRN
  Administered 2015-10-14: 12 mL

## 2015-10-14 MED ORDER — ONDANSETRON 4 MG PO TBDP: 4.0000 mg | ORAL_TABLET | Freq: Four times a day (QID) | ORAL | Status: DC | PRN

## 2015-10-14 MED ORDER — OXYCODONE HCL 5 MG PO TABS
ORAL_TABLET | ORAL | Status: AC
  Filled 2015-10-14: qty 2

## 2015-10-14 MED ORDER — ACETAMINOPHEN 10 MG/ML IV SOLN
INTRAVENOUS | Status: DC | PRN
  Administered 2015-10-14: 1000 mg via INTRAVENOUS

## 2015-10-14 MED ORDER — LIDOCAINE HCL (CARDIAC) 20 MG/ML IV SOLN
INTRAVENOUS | Status: DC | PRN
  Administered 2015-10-14: 100 mg via INTRAVENOUS

## 2015-10-14 MED ORDER — FENTANYL CITRATE (PF) 250 MCG/5ML IJ SOLN
INTRAMUSCULAR | Status: AC
  Filled 2015-10-14: qty 5

## 2015-10-14 MED ORDER — HYDROMORPHONE HCL 1 MG/ML IJ SOLN
0.2500 mg | INTRAMUSCULAR | Status: DC | PRN
  Administered 2015-10-14: 0.5 mg via INTRAVENOUS

## 2015-10-14 MED ORDER — PROPOFOL 10 MG/ML IV BOLUS
INTRAVENOUS | Status: AC
  Filled 2015-10-14: qty 40

## 2015-10-14 MED ORDER — ACETAMINOPHEN 10 MG/ML IV SOLN
INTRAVENOUS | Status: AC
  Filled 2015-10-14: qty 100

## 2015-10-14 MED ORDER — ONDANSETRON HCL 4 MG/2ML IJ SOLN: 4.0000 mg | INTRAMUSCULAR | Status: DC | PRN

## 2015-10-14 MED ORDER — MIDAZOLAM HCL 5 MG/5ML IJ SOLN
INTRAMUSCULAR | Status: DC | PRN
  Administered 2015-10-14: 2 mg via INTRAVENOUS

## 2015-10-14 MED ORDER — MIDAZOLAM HCL 2 MG/2ML IJ SOLN
INTRAMUSCULAR | Status: AC
  Filled 2015-10-14: qty 2

## 2015-10-14 MED ORDER — PROPOFOL 10 MG/ML IV BOLUS
INTRAVENOUS | Status: DC | PRN
  Administered 2015-10-14: 200 mg via INTRAVENOUS

## 2015-10-14 MED ORDER — LACTATED RINGERS IV SOLN
INTRAVENOUS | Status: DC | PRN
  Administered 2015-10-14 (×2): via INTRAVENOUS

## 2015-10-14 MED ORDER — SUGAMMADEX SODIUM 500 MG/5ML IV SOLN
INTRAVENOUS | Status: AC
  Filled 2015-10-14: qty 5

## 2015-10-14 MED ORDER — PHENYLEPHRINE HCL 10 MG/ML IJ SOLN
INTRAMUSCULAR | Status: DC | PRN
  Administered 2015-10-14: 120 ug via INTRAVENOUS

## 2015-10-14 MED ORDER — MEPERIDINE HCL 25 MG/ML IJ SOLN: 6.2500 mg | INTRAMUSCULAR | Status: DC | PRN

## 2015-10-14 MED ORDER — CEFAZOLIN SODIUM-DEXTROSE 2-4 GM/100ML-% IV SOLN
2.0000 g | Freq: Three times a day (TID) | INTRAVENOUS | Status: AC
  Administered 2015-10-14: 2 g via INTRAVENOUS
  Filled 2015-10-14: qty 100

## 2015-10-14 MED ORDER — ONDANSETRON HCL 4 MG/2ML IJ SOLN
INTRAMUSCULAR | Status: DC | PRN
  Administered 2015-10-14: 4 mg via INTRAVENOUS

## 2015-10-14 MED ORDER — ONDANSETRON HCL 4 MG/2ML IJ SOLN
INTRAMUSCULAR | Status: AC
  Filled 2015-10-14: qty 2

## 2015-10-14 MED ORDER — FENTANYL CITRATE (PF) 250 MCG/5ML IJ SOLN
INTRAMUSCULAR | Status: DC | PRN
  Administered 2015-10-14: 250 ug via INTRAVENOUS
  Administered 2015-10-14: 50 ug via INTRAVENOUS

## 2015-10-14 MED ORDER — IBUPROFEN 800 MG PO TABS
800.0000 mg | ORAL_TABLET | Freq: Three times a day (TID) | ORAL | Status: DC | PRN
  Administered 2015-10-15: 800 mg via ORAL
  Filled 2015-10-14: qty 1

## 2015-10-14 MED ORDER — SODIUM CHLORIDE 0.9 % IR SOLN
Status: DC | PRN
  Administered 2015-10-14: 1000 mL

## 2015-10-14 MED ORDER — OXYCODONE HCL 5 MG PO TABS
5.0000 mg | ORAL_TABLET | ORAL | Status: DC | PRN
  Administered 2015-10-14 – 2015-10-15 (×3): 10 mg via ORAL
  Filled 2015-10-14 (×2): qty 2

## 2015-10-14 MED ORDER — OXYCODONE HCL 5 MG PO TABS: 5.0000 mg | ORAL_TABLET | ORAL | Status: DC | PRN

## 2015-10-14 MED ORDER — HEPARIN SODIUM (PORCINE) 5000 UNIT/ML IJ SOLN
5000.0000 [IU] | Freq: Three times a day (TID) | INTRAMUSCULAR | Status: DC
  Administered 2015-10-15: 5000 [IU] via SUBCUTANEOUS
  Filled 2015-10-14: qty 1

## 2015-10-14 MED ORDER — 0.9 % SODIUM CHLORIDE (POUR BTL) OPTIME
TOPICAL | Status: DC | PRN
  Administered 2015-10-14: 1000 mL

## 2015-10-14 MED ORDER — KETOROLAC TROMETHAMINE 30 MG/ML IJ SOLN: 30.0000 mg | Freq: Once | INTRAMUSCULAR | Status: DC

## 2015-10-14 MED ORDER — MORPHINE SULFATE (PF) 2 MG/ML IV SOLN
2.0000 mg | INTRAVENOUS | Status: DC | PRN
  Administered 2015-10-14: 2 mg via INTRAVENOUS
  Administered 2015-10-14: 4 mg via INTRAVENOUS
  Administered 2015-10-15: 2 mg via INTRAVENOUS
  Filled 2015-10-14: qty 1
  Filled 2015-10-14: qty 2
  Filled 2015-10-14: qty 1

## 2015-10-14 MED ORDER — BUPIVACAINE-EPINEPHRINE (PF) 0.5% -1:200000 IJ SOLN
INTRAMUSCULAR | Status: AC
  Filled 2015-10-14: qty 30

## 2015-10-14 MED ORDER — KETOROLAC TROMETHAMINE 30 MG/ML IJ SOLN
INTRAMUSCULAR | Status: DC | PRN
  Administered 2015-10-14: 30 mg via INTRAVENOUS

## 2015-10-14 MED ORDER — KETOROLAC TROMETHAMINE 30 MG/ML IJ SOLN
INTRAMUSCULAR | Status: AC
  Filled 2015-10-14: qty 1

## 2015-10-14 MED ORDER — DEXAMETHASONE SODIUM PHOSPHATE 4 MG/ML IJ SOLN
INTRAMUSCULAR | Status: DC | PRN
  Administered 2015-10-14: 10 mg via INTRAVENOUS

## 2015-10-14 MED ORDER — LIDOCAINE HCL 4 % EX SOLN
CUTANEOUS | Status: DC | PRN
  Administered 2015-10-14: 5 mL via TOPICAL

## 2015-10-14 MED ORDER — HYDROMORPHONE HCL 1 MG/ML IJ SOLN
INTRAMUSCULAR | Status: AC
  Filled 2015-10-14: qty 1

## 2015-10-14 MED ORDER — ROCURONIUM BROMIDE 50 MG/5ML IV SOLN
INTRAVENOUS | Status: AC
  Filled 2015-10-14: qty 1

## 2015-10-14 MED ORDER — PROMETHAZINE HCL 25 MG/ML IJ SOLN: 6.2500 mg | INTRAMUSCULAR | Status: DC | PRN

## 2015-10-14 MED ORDER — ROCURONIUM BROMIDE 100 MG/10ML IV SOLN
INTRAVENOUS | Status: DC | PRN
  Administered 2015-10-14: 25 mg via INTRAVENOUS
  Administered 2015-10-14: 50 mg via INTRAVENOUS

## 2015-10-14 MED ORDER — DEXAMETHASONE SODIUM PHOSPHATE 10 MG/ML IJ SOLN
INTRAMUSCULAR | Status: AC
  Filled 2015-10-14: qty 1

## 2015-10-14 MED ORDER — KCL IN DEXTROSE-NACL 20-5-0.9 MEQ/L-%-% IV SOLN
INTRAVENOUS | Status: DC
  Administered 2015-10-14: 12:00:00 via INTRAVENOUS
  Filled 2015-10-14 (×3): qty 1000

## 2015-10-14 MED ORDER — HYDROCHLOROTHIAZIDE 25 MG PO TABS
12.5000 mg | ORAL_TABLET | Freq: Every day | ORAL | Status: DC
  Administered 2015-10-14 – 2015-10-15 (×2): 12.5 mg via ORAL
  Filled 2015-10-14 (×2): qty 1

## 2015-10-14 MED ORDER — SUCCINYLCHOLINE CHLORIDE 20 MG/ML IJ SOLN
INTRAMUSCULAR | Status: DC | PRN
  Administered 2015-10-14: 140 mg via INTRAVENOUS

## 2015-10-14 SURGICAL SUPPLY — 52 items
APPLIER CLIP LOGIC TI 5 (MISCELLANEOUS) IMPLANT
BANDAGE ADH SHEER 1  50/CT (GAUZE/BANDAGES/DRESSINGS) ×8 IMPLANT
BENZOIN TINCTURE PRP APPL 2/3 (GAUZE/BANDAGES/DRESSINGS) ×2 IMPLANT
BINDER ABD UNIV 12 45-62 (WOUND CARE) ×1 IMPLANT
BINDER ABDOMINAL 46IN 62IN (WOUND CARE) ×2
BLADE SURG ROTATE 9660 (MISCELLANEOUS) IMPLANT
CANISTER SUCTION 2500CC (MISCELLANEOUS) IMPLANT
CHLORAPREP W/TINT 26ML (MISCELLANEOUS) ×2 IMPLANT
COVER SURGICAL LIGHT HANDLE (MISCELLANEOUS) ×2 IMPLANT
DEVICE TROCAR PUNCTURE CLOSURE (ENDOMECHANICALS) ×2 IMPLANT
DISSECTOR BLUNT TIP ENDO 5MM (MISCELLANEOUS) IMPLANT
DRAPE INCISE IOBAN 66X45 STRL (DRAPES) ×2 IMPLANT
DRAPE LAPAROSCOPIC ABDOMINAL (DRAPES) ×2 IMPLANT
DRSG TEGADERM 2-3/8X2-3/4 SM (GAUZE/BANDAGES/DRESSINGS) ×8 IMPLANT
ELECT REM PT RETURN 9FT ADLT (ELECTROSURGICAL) ×2
ELECTRODE REM PT RTRN 9FT ADLT (ELECTROSURGICAL) ×1 IMPLANT
GAUZE SPONGE 2X2 8PLY STRL LF (GAUZE/BANDAGES/DRESSINGS) ×1 IMPLANT
GLOVE BIOGEL PI IND STRL 8 (GLOVE) ×1 IMPLANT
GLOVE BIOGEL PI INDICATOR 8 (GLOVE) ×1
GLOVE ECLIPSE 8.0 STRL XLNG CF (GLOVE) ×2 IMPLANT
GOWN STRL REUS W/ TWL LRG LVL3 (GOWN DISPOSABLE) ×2 IMPLANT
GOWN STRL REUS W/TWL LRG LVL3 (GOWN DISPOSABLE) ×2
KIT BASIN OR (CUSTOM PROCEDURE TRAY) ×2 IMPLANT
KIT ROOM TURNOVER OR (KITS) ×2 IMPLANT
MARKER SKIN DUAL TIP RULER LAB (MISCELLANEOUS) ×2 IMPLANT
MESH VENTRALIGHT ST 6X8 (Mesh Specialty) ×2 IMPLANT
MESH VENTRLGHT ELLIPSE 8X6XMFL (Mesh Specialty) ×1 IMPLANT
NEEDLE SPNL 22GX3.5 QUINCKE BK (NEEDLE) ×2 IMPLANT
NS IRRIG 1000ML POUR BTL (IV SOLUTION) ×2 IMPLANT
PAD ARMBOARD 7.5X6 YLW CONV (MISCELLANEOUS) ×4 IMPLANT
SCALPEL HARMONIC ACE (MISCELLANEOUS) ×2 IMPLANT
SCISSORS LAP 5X35 DISP (ENDOMECHANICALS) ×2 IMPLANT
SET IRRIG TUBING LAPAROSCOPIC (IRRIGATION / IRRIGATOR) ×2 IMPLANT
SLEEVE ENDOPATH XCEL 5M (ENDOMECHANICALS) ×4 IMPLANT
SPONGE GAUZE 2X2 STER 10/PKG (GAUZE/BANDAGES/DRESSINGS) ×1
STRIP CLOSURE SKIN 1/2X4 (GAUZE/BANDAGES/DRESSINGS) ×2 IMPLANT
SUT MNCRL AB 4-0 PS2 18 (SUTURE) ×4 IMPLANT
SUT MON AB 4-0 PC3 18 (SUTURE) ×2 IMPLANT
SUT NOVA NAB DX-16 0-1 5-0 T12 (SUTURE) ×2 IMPLANT
SUT VICRYL 0 TIES 12 18 (SUTURE) IMPLANT
SUT VICRYL 0 UR6 27IN ABS (SUTURE) IMPLANT
TACKER 5MM HERNIA 3.5CML NAB (ENDOMECHANICALS) ×4 IMPLANT
TOWEL OR 17X24 6PK STRL BLUE (TOWEL DISPOSABLE) ×2 IMPLANT
TOWEL OR 17X26 10 PK STRL BLUE (TOWEL DISPOSABLE) ×2 IMPLANT
TRAY FOLEY CATH 14FRSI W/METER (CATHETERS) IMPLANT
TRAY LAPAROSCOPIC MC (CUSTOM PROCEDURE TRAY) ×2 IMPLANT
TROCAR 12M 150ML BLUNT (TROCAR) ×2 IMPLANT
TROCAR 5M 150ML BLDLS (TROCAR) ×4 IMPLANT
TROCAR XCEL NON-BLD 11X100MML (ENDOMECHANICALS) ×2 IMPLANT
TROCAR XCEL NON-BLD 5MMX100MML (ENDOMECHANICALS) ×2 IMPLANT
TUBING INSUFFLATION (TUBING) ×2 IMPLANT
WATER STERILE IRR 1000ML POUR (IV SOLUTION) IMPLANT

## 2015-10-14 NOTE — Anesthesia Postprocedure Evaluation (Signed)
Anesthesia Post Note  Patient: [name] SimmondsShama J [name]  Procedure(s) Performed: Procedure(s) (LRB): LAPAROSCOPIC VENTRAL HERNIA (N/A) INSERTION OF MESH (N/A)  Patient location during evaluation: PACU Anesthesia Type: General Level of consciousness: sedated Pain management: pain level controlled Vital Signs Assessment: post-procedure vital signs reviewed and stable Respiratory status: spontaneous breathing Cardiovascular status: stable Postop Assessment: no signs of nausea or vomiting Anesthetic complications: no     Last Vitals:  Filed Vitals:   10/14/15 0553 10/14/15 0930  BP: 115/46 118/58  Pulse: 92 97  Temp: 36.9 C 36.5 C  Resp: 18 16    Last Pain:  Filed Vitals:   10/14/15 0941  PainSc: 0-No pain   Pain Goal:                 Juanpablo Ciresi JR,JOHN Annell Canty

## 2015-10-14 NOTE — H&P (Signed)
[name] is an 30 y.o. female.   Chief Complaint:  Here for elective surgery HPI:  She has a large, symptomatic umbilical hernia and presents for elective repair.  Past Medical History  Diagnosis Date  . Obesity   . Varicose veins   . Ankle ulcer (HCC)     right  . 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 Hypertension   . Asthma     as a child    Past Surgical History  Procedure Laterality Date  . Tonsillectomy    . Tympanostomy tube placement    . Vein ligation and stripping Right 12/15/2014    Procedure: SAPHENOUS VEIN LIGATION AND STRIPPING; GREATER THAN 20 STAB PHLEBECTOMIES;  Surgeon: Larina Earthlyodd F Early, MD;  [facility];  Service: Vascular;  Laterality: Right;    Family History  Problem Relation Age of Onset  . Diabetes Mother   . Varicose Veins Mother   . Varicose Veins Sister    Social History:  reports that she has never smoked. She has never used smokeless tobacco. She reports that she drinks about 0.6 oz of alcohol per week. She reports that she uses illicit drugs (Marijuana).  Allergies:  Allergies  Allergen Reactions  . Tramadol Other (See Comments)    Did not make her feel like herself    Medications Prior to Admission  Medication Sig Dispense Refill  . hydrochlorothiazide (HYDRODIURIL) 12.5 MG tablet Take 1 tablet (12.5 mg total) by mouth daily. 90 tablet 3  . ibuprofen (ADVIL,MOTRIN) 800 MG tablet Take 1 tablet (800 mg total) by mouth every 8 (eight) hours as needed for moderate pain. 30 tablet 0  . Multiple Vitamin (MULTIVITAMIN) tablet Take 2 tablets by mouth daily. Vitafusion gummies      No results found for this or any previous visit (from the past 48 hour(s)). No results found.  Review of Systems  Constitutional: Negative for fever and chills.  Gastrointestinal: Negative for nausea, vomiting and diarrhea.    Blood pressure 115/46, pulse 92, temperature 98.4 F (36.9 C), temperature source Oral, resp.  rate 18, height 5\' 6"  (1.676 m), weight 153.769 kg (339 lb), SpO2 100 %. Physical Exam  Constitutional:  Obese female in NAD  HENT:  Head: Normocephalic and atraumatic.  Cardiovascular: Normal rate and regular rhythm.   Respiratory: Effort normal and breath sounds normal.  GI: Soft.  Moderate to large periumbilical bulge.  Musculoskeletal: She exhibits no edema.  Neurological: She is alert.  Skin: Skin is warm.     Assessment/Plan Large, symptomatic umbilical hernia in an obese female.  Plan:  Laparoscopic repair of the hernia with mesh.  Adolph PollackOSENBOWER,Rosezetta Balderston J, MD 10/14/2015, 7:24 AM

## 2015-10-14 NOTE — Transfer of Care (Signed)
Immediate Anesthesia Transfer of Care Note  Patient: [name] SimmondsShama J [name]  Procedure(s) Performed: Procedure(s): LAPAROSCOPIC VENTRAL HERNIA (N/A) INSERTION OF MESH (N/A)  Patient Location: PACU  Anesthesia Type:General  Level of Consciousness: awake, alert  and oriented  Airway & Oxygen Therapy: Patient Spontanous Breathing and Patient connected to nasal cannula oxygen  Post-op Assessment: Report given to RN, Post -op Vital signs reviewed and stable and Patient moving all extremities X 4  Post vital signs: Reviewed and stable  Last Vitals:  Filed Vitals:   10/14/15 0553  BP: 115/46  Pulse: 92  Temp: 36.9 C  Resp: 18    Last Pain: There were no vitals filed for this visit.       Complications: No apparent anesthesia complications

## 2015-10-14 NOTE — Op Note (Signed)
Operative Note  [name] female 30 y.o. 10/14/2015  PREOPERATIVE DX:  Umbilical hernia  POSTOPERATIVE DX:  Same  PROCEDURE:   Laparoscopic repair of umbilical hernia (7 cm) with mesh         Surgeon: Adolph PollackOSENBOWER,Efrata Brunner J   Assistants: none  Anesthesia: General endotracheal anesthesia  Indications:   This is a 30 year old female who is super morbidly obese and has a 7 cm symptomatic umbilical hernia.  She now presents for repair.    Procedure Detail:  She was brought to the operating room placed supine on the operating table in the general anesthetic was given. The abdominal wall was widely sterilely prepped and draped. A timeout was performed.  A 5 mm incision was made in the left upper quadrant subcostal area. Using a 5 mm Optiview trocar and laparoscope, access was gained to the peritoneal cavity. The area underneath the trocar was inspected and there is no evidence of bleeding or organ injury. A 5 mm trocar was then placed in the left lower quadrant. The hernia was identified and there was omentum going up into it and there is surrounding adhesions. Using sharp and blunt dissection, I lysed the adhesions and reduce the omental part out of the hernia. There is also a significant amount of extraperitoneal fat present in the hernia which was just brought to the right of the umbilicus. I reduced this and then divided the falciform ligament to allow for placement of mesh.    Using a spinal needle, identified the edges of the hernia measured 4 cm away from them. I then brought in the 15 cm x 20 cm piece of Ventralite composite mesh. A small amount of the mesh was trimmed off.4 anchoring sutures of #1 Novafil were placed at 12, 3, 6, and 9:00 positions. An 11 mm trocar was then placed in the right upper quadrant. The mesh was then  then placed into the peritoneal cavity and deployed so that the rough side was facing the abdominal wall and a non-adherent barrier side was facing the viscera.  4 stab wounds were made around the periphery of the hernia and the anchoring sutures were then brought up across the fascial bridge They were tied down anchoring the mesh to the abdominal wall. Using spiral tacks, I then anchored the periphery of the mesh to the abdominal wall to allow for adequate coverage and good overlap of the hernia defect.  Following this I did a 4 quadrant and central inspection. There is no evidence of bleeding or organ injury.  I released the CO2 gas and watched the mesh approximate the omentum from an viscera. The trocars were removed.  All skin incisions were then closed with 4-0 Monocryl subcuticular stitches. Steri-Strips and sterile dressings were applied.  She tolerated the procedure well without any apparent complications and was taken to the recovery room in satisfactory condition.  Estimated Blood Loss:  100 ml               Complications:  * No complications entered in OR log *         Disposition: PACU - hemodynamically stable.         Condition: stable

## 2015-10-14 NOTE — Interval H&P Note (Signed)
History and Physical Interval Note:  10/14/2015 7:26 AM  [name]  has presented today for surgery, with the diagnosis of ventral hernia  The various methods of treatment have been discussed with the patient and family. After consideration of risks, benefits and other options for treatment, the patient has consented to  Procedure(s): LAPAROSCOPIC VENTRAL HERNIA (N/A) INSERTION OF MESH (N/A) as a surgical intervention .  The patient's history has been reviewed, patient examined, no change in status, stable for surgery.  I have reviewed the patient's chart and labs.  Questions were answered to the patient's satisfaction.     [name]

## 2015-10-14 NOTE — Anesthesia Procedure Notes (Signed)
Procedure Name: Intubation Date/Time: 10/14/2015 7:43 AM Performed by: Ollen Bowl Pre-anesthesia Checklist: Patient identified, Emergency Drugs available, Suction available, Patient being monitored and Timeout performed Patient Re-evaluated:Patient Re-evaluated prior to inductionOxygen Delivery Method: Circle system utilized and Simple face mask Preoxygenation: Pre-oxygenation with 100% oxygen Intubation Type: IV induction and Rapid sequence Ventilation: Mask ventilation without difficulty Laryngoscope Size: Miller and 2 Grade View: Grade I Tube type: Oral Tube size: 7.5 mm Number of attempts: 1 Airway Equipment and Method: Patient positioned with wedge pillow,  Stylet and LTA kit utilized Placement Confirmation: ETT inserted through vocal cords under direct vision,  positive ETCO2 and breath sounds checked- equal and bilateral Secured at: 21 cm Tube secured with: Tape Dental Injury: Teeth and Oropharynx as per pre-operative assessment

## 2015-10-14 NOTE — Discharge Instructions (Signed)
[facility]   HERNIA REPAIR: POST OP INSTRUCTIONS  Always review your discharge instruction sheet given to you by the facility where your surgery was performed. IF YOU HAVE DISABILITY OR FAMILY LEAVE FORMS, YOU MUST BRING THEM TO THE OFFICE FOR PROCESSING.   DO NOT GIVE THEM TO YOUR DOCTOR.  1. A  prescription for pain medication may be given to you upon discharge.  Take your pain medication as prescribed, if needed.  If narcotic pain medicine is not needed, then you may take acetaminophen (Tylenol) or ibuprofen (Advil) as needed. 2. Take your usually prescribed medications unless otherwise directed. 3. If you need a refill on your pain medication, please contact your pharmacy.  They will contact our office to request authorization. Prescriptions will not be filled after 5 pm or on week-ends. 4. You should follow a light diet the first 24 hours after arrival home.  Be sure to include lots of fluids daily.  Resume your normal diet the day after surgery. 5. Most patients will experience some swelling and bruising around the umbilicus.  Ice packs and reclining will help.  Swelling and bruising can take several days to resolve.  6. It is common to experience some constipation if taking pain medication after surgery.  Increasing fluid intake and taking a stool softener (such as Colace) will usually help or prevent this problem from occurring.  A mild laxative (Milk of Magnesia or Miralax) should be taken according to package directions if there are no bowel movements after 48 hours. 7. Unless discharge instructions indicate otherwise, you may remove your bandages 72 hours after surgery, and you may shower the day after your surgery.  You may have steri-strips (small skin tapes) in place directly over the incision.  These strips should be left on the skin.  If your surgeon used skin glue on the incision, you may shower in 24 hours.  The glue will flake off over the next 2-3 weeks.   Any sutures or staples will be removed at the office during your follow-up visit. 8. ACTIVITIES:  You may resume regular (light) daily activities beginning the next day--such as daily self-care, walking, climbing stairs--gradually increasing activities as tolerated.  You may have sexual intercourse when it is comfortable.  Refrain from any heavy lifting or straining-nothing over 10 pounds for 6 weeks.  a. You may drive when you are no longer taking prescription pain medication, you can comfortably wear a seatbelt, and you can safely maneuver your car and apply brakes. b. RETURN TO WORK:  When released by MD__________________________________________________________ 9. You should see your doctor in the office for a follow-up appointment approximately 2-3 weeks after your surgery.  Make sure that you call for this appointment within a day or two after you arrive home to insure a convenient appointment time. 10. OTHER INSTRUCTIONS:  _Wear the abdominal binder.  Do not lie flat._________________________________________________________________________________________________________________________________________________________________________________________  WHEN TO CALL YOUR DOCTOR: 1. Fever over 101.0 2. Inability to urinate 3. Nausea and/or vomiting 4. Extreme swelling or bruising 5. Continued bleeding from incision. 6. Increased pain, redness, or drainage from the incision  The clinic staff is available to answer your questions during regular business hours.  Please dont hesitate to call and ask to speak to one of the nurses for clinical concerns.  If you have a medical emergency, go to the nearest emergency room or call 911.  [facility] is always on call at the hospital   [address], [address], [place], [place]  [address] ? ° [address] [address], [place], [place]   [address] °[phone] ? [phone] ? FAX [phone] °Web site:  [email or web address] ° °

## 2015-10-14 NOTE — Progress Notes (Signed)
Pt admitted to 6N via stretcher from PACU.  Pt AAO X 4.  Pt on RA.  Pt has 18G to Rt AC with fluids infusing.  Pt has abd lap sites X 7:  4 lap sites with band-aid and steri-strips, and 3 with gauze and transparent dressing.  SCDs in place.  Report rcvd from [place].  Pt has no questions at the moment.  Will continue to monitor.

## 2015-10-14 NOTE — Progress Notes (Signed)
Pt sister telling the Nurse tech Rene KocherRegina why she is staring me like that, she said I'm not staring at you I'm looking at the little girl and the sister said No you are looking at me bad and she said I need to talk to your supervisor ( We are about to slide the patient during that time). But Rene KocherRegina insisted I'm not looking at you , patient mother also at the bedside, the charge nurse asked if they have complaint they said NO, her mother said don't mind her she is crazy.

## 2015-10-15 ENCOUNTER — Encounter (HOSPITAL_COMMUNITY): Payer: Self-pay | Admitting: General Surgery

## 2015-10-15 DIAGNOSIS — I10 Essential (primary) hypertension: Secondary | ICD-10-CM | POA: Diagnosis not present

## 2015-10-15 DIAGNOSIS — K429 Umbilical hernia without obstruction or gangrene: Secondary | ICD-10-CM | POA: Diagnosis not present

## 2015-10-15 DIAGNOSIS — Z79899 Other long term (current) drug therapy: Secondary | ICD-10-CM | POA: Diagnosis not present

## 2015-10-15 DIAGNOSIS — Z6841 Body Mass Index (BMI) 40.0 and over, adult: Secondary | ICD-10-CM | POA: Diagnosis not present

## 2015-10-15 MED ORDER — OXYCODONE HCL 5 MG PO TABS: 5.0000 mg | ORAL_TABLET | ORAL | Status: DC | PRN

## 2015-10-15 NOTE — Progress Notes (Signed)
Pt for DC to home, DC instructions given and reviewed.  Rx given and explained.  Follow up appt for 2 weeks [facility], # given.

## 2015-10-15 NOTE — Discharge Summary (Signed)
Physician Discharge Summary [facility]  Patient ID: Conley SimmondsShama J Preast MRN: [id number] DOB/AGE: 30/11/1985 30 y.o.  Admit date: 10/14/2015 Discharge date: 10/15/2015  Admission Diagnoses:  Umbilical hernia  Discharge Diagnoses:  Active Problems:   Umbilical hernia   Discharged Condition: good  Hospital Course: patient underwent lap umbilical hernia repair with mesh on 10/14/2015.  Post op course uncomplicated.  Patient tolerated diet. Ambulatory.  Prepared for discharge on POD#1.  Consults: None  Treatments: surgery: lap umb hernia repair with mesh  Discharge Exam: Blood pressure 109/57, pulse 75, temperature 98.3 F (36.8 C), temperature source Oral, resp. rate 17, height 5\' 6"  (1.676 m), weight 154.858 kg (341 lb 6.4 oz), last menstrual period 09/18/2015, SpO2 99 %. HEENT - clear Neck - soft Chest - clear bilaterally Cor - RRR Abd - binder on; wounds dry and intact with steristrips and bandaids  Disposition: Home  Discharge Instructions    Diet - low sodium heart healthy    Complete by:  As directed      Discharge instructions    Complete by:  As directed   [facility]  LAPAROSCOPIC SURGERY:  POST-OP INSTRUCTIONS  Always review your discharge instruction sheet given to you by the facility where your surgery was performed.  A prescription for pain medication may be given to you upon discharge.  Take your pain medication as prescribed.  If narcotic pain medicine is not needed, then you may take acetaminophen (Tylenol) or ibuprofen (Advil) as needed.  Take your usually prescribed medications unless otherwise directed.  If you need a refill on your pain medication, please contact your pharmacy.  They will contact our office to request authorization. Prescriptions will not be filled after 5 P.M. or on weekends.  You should follow a light diet the first few days after arrival home, such as soup and crackers or toast.  Be sure to include  plenty of fluids daily.  Most patients will experience some swelling and bruising in the area of the incisions.  Ice packs will help.  Swelling and bruising can take several days to resolve.   It is common to experience some constipation after surgery.  Increasing fluid intake and taking a stool softener (such as Colace) will usually help or prevent this problem from occurring.  A mild laxative (Milk of Magnesia or Miralax) should be taken according to package instructions if there has been no bowel movement after 48 hours.  You will have steri-strips and a gauze dressing over your incisions.  You may remove the gauze bandage on the second day after surgery, and you may shower at that time.  Leave your steri-strips (small skin tapes) in place directly over the incision.  These strips should remain on the skin for 5-7 days and then be removed.  You may get them wet in the shower and pat them dry.  Any sutures or staples will be removed at the office during your follow-up visit.  ACTIVITIES:  You may resume regular (light) daily activities beginning the next day - such as daily self-care, walking, climbing stairs - gradually increasing activities as tolerated.  You may have sexual intercourse when it is comfortable.  Refrain from any heavy lifting or straining until approved by your doctor.  You may drive when you are no longer taking prescription pain medication, you can comfortably wear a seatbelt, and you can safely maneuver your car and apply brakes.  You should see your doctor in the office for a  follow-up appointment approximately 2-3 weeks after your surgery.  Make sure that you call for this appointment within a day or two after you arrive home to insure a convenient appointment time.  WHEN TO CALL YOUR DOCTOR: Fever over 101.0 Inability to urinate Continued bleeding from incision Increased pain, redness, or drainage from the incision Increasing abdominal pain  The clinic staff is  available to answer your questions during regular business hours.  Please don't hesitate to call and ask to speak to one of the nurses for clinical concerns.  If you have a medical emergency, go to the nearest emergency room or call 911.  [facility] is always on call for the hospital.  Velora Hecklerodd M. Anibal Quinby, MD, [facility] Office: [phone] Toll Free:  [phone] FAX [phone]  Website: [email or web address]     Increase activity slowly    Complete by:  As directed      Remove dressing in 24 hours    Complete by:  As directed             Medication List    TAKE these medications        hydrochlorothiazide 12.5 MG tablet  Commonly known as:  HYDRODIURIL  Take 1 tablet (12.5 mg total) by mouth daily.     ibuprofen 800 MG tablet  Commonly known as:  ADVIL,MOTRIN  Take 1 tablet (800 mg total) by mouth every 8 (eight) hours as needed for moderate pain.     multivitamin tablet  Take 2 tablets by mouth daily. Vitafusion gummies     oxyCODONE 5 MG immediate release tablet  Commonly known as:  Oxy IR/ROXICODONE  Take 1-2 tablets (5-10 mg total) by mouth every 4 (four) hours as needed for moderate pain, severe pain or breakthrough pain.     oxyCODONE 5 MG immediate release tablet  Commonly known as:  Oxy IR/ROXICODONE  Take 1-2 tablets (5-10 mg total) by mouth every 4 (four) hours as needed for moderate pain.         Velora Hecklerodd M. Petr Bontempo, MD, [facility] Office: [phone]   Signed: Velora HecklerGERKIN,Radwan Cowley M 10/15/2015, 9:18 AM

## 2015-11-22 ENCOUNTER — Encounter (HOSPITAL_COMMUNITY): Payer: Self-pay | Admitting: General Surgery

## 2016-01-03 NOTE — Progress Notes (Deleted)
   Subjective:    Patient ID: [name] SimmondsShama J [name] , female   DOB: 10/19/1985 , 30 y.o..   MRN: [id number]  HPI  [name] is here for back pain.  BACK PAIN  Location: ***  Quality: ***  Onset: *** Worse with: ***  Better with: ***  Radiation: *** Trauma: *** Best sitting/standing/leaning forward: ***  Red Flags Fecal/urinary incontinence: {YES/NO/WILD NWGNF:62130}CARDS:18581}  Numbness/Weakness: {YES/NO/WILD QMVHQ:46962}CARDS:18581}  Fever/chills/sweats: {YES/NO/WILD CARDS:18581}  Night pain: {YES/NO/WILD CARDS:18581}  Unexplained weight loss: {YES/NO/WILD CARDS:18581}  No relief with bedrest: {YES/NO/WILD CARDS:18581}  h/o cancer/immunosuppression: {YES/NO/WILD CARDS:18581}  IV drug use: {YES/NO/WILD CARDS:18581}  PMH of osteoporosis or chronic steroid use: {YES/NO/WILD CARDS:18581}     Review of Systems: Per HPI. All other systems reviewed and are negative.  Health Maintenance Due  Topic Date Due  . INFLUENZA VACCINE  11/15/2015    Past Medical History: Patient Active Problem List   Diagnosis Date Noted  . Umbilical hernia 10/14/2015  . Epigastric abdominal pain 03/01/2015  . 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 Low back pain 04/23/2014  . Possible exposure to STD 01/25/2014  . Birth control counseling 01/25/2014  . Essential hypertension, benign 11/16/2013  . Well woman exam 11/16/2013  . Elevated BP 10/08/2013  . Menorrhagia 10/08/2013  . History of iritis 10/08/2013  . Varicose veins of lower extremities with other complications 10/25/2011  . Obesity 10/25/2011    Medications: reviewed and updated Current Outpatient Prescriptions  Medication Sig Dispense Refill  . hydrochlorothiazide (HYDRODIURIL) 12.5 MG tablet Take 1 tablet (12.5 mg total) by mouth daily. 90 tablet 3  . ibuprofen (ADVIL,MOTRIN) 800 MG tablet Take 1 tablet (800 mg total) by mouth every 8 (eight) hours as needed for moderate pain. 30 tablet 0  . Multiple Vitamin  (MULTIVITAMIN) tablet Take 2 tablets by mouth daily. Vitafusion gummies    . oxyCODONE (OXY IR/ROXICODONE) 5 MG immediate release tablet Take 1-2 tablets (5-10 mg total) by mouth every 4 (four) hours as needed for moderate pain, severe pain or breakthrough pain. 40 tablet 0  . oxyCODONE (OXY IR/ROXICODONE) 5 MG immediate release tablet Take 1-2 tablets (5-10 mg total) by mouth every 4 (four) hours as needed for moderate pain. 30 tablet 0   No current facility-administered medications for this visit.     Social Hx:  reports that she has never smoked. She has never used smokeless tobacco.    Objective:   There were no vitals taken for this visit. Physical Exam  Gen: NAD, alert, cooperative with exam, well-appearing HEENT: NCAT, PERRL, clear conjunctiva, oropharynx clear, supple neck Cardiac: Regular rate and rhythm, normal S1/S2, no murmur, no edema, capillary refill brisk  Respiratory: Clear to auscultation bilaterally, no wheezes, non-labored breathing Gastrointestinal: soft, non tender, non distended, bowel sounds present Skin: no rashes, normal turgor  Neurological: no gross deficits.  Psych: good insight, normal mood and affect   Assessment & Plan:  No problem-specific Assessment & Plan notes found for this encounter.   Anders Simmondshristina Docia Klar, MD [facility], PGY-2

## 2016-01-04 ENCOUNTER — Ambulatory Visit: Payer: BLUE CROSS/BLUE SHIELD | Admitting: Family Medicine

## 2016-01-05 ENCOUNTER — Ambulatory Visit: Payer: BLUE CROSS/BLUE SHIELD | Admitting: Family Medicine

## 2016-01-20 ENCOUNTER — Ambulatory Visit: Payer: BLUE CROSS/BLUE SHIELD | Admitting: Family Medicine

## 2016-02-16 ENCOUNTER — Encounter: Payer: Self-pay | Admitting: Family Medicine

## 2016-02-16 ENCOUNTER — Ambulatory Visit (INDEPENDENT_AMBULATORY_CARE_PROVIDER_SITE_OTHER): Payer: BLUE CROSS/BLUE SHIELD | Admitting: Family Medicine

## 2016-02-16 VITALS — BP 110/70 | HR 74 | Temp 98.3°F | Ht 66.0 in | Wt 344.0 lb

## 2016-02-16 DIAGNOSIS — M545 Low back pain, unspecified: Secondary | ICD-10-CM

## 2016-02-16 DIAGNOSIS — G8929 Other chronic pain: Secondary | ICD-10-CM | POA: Diagnosis not present

## 2016-02-16 DIAGNOSIS — M25561 Pain in right knee: Secondary | ICD-10-CM | POA: Diagnosis not present

## 2016-02-16 MED ORDER — DICLOFENAC SODIUM 75 MG PO TBEC
75.0000 mg | DELAYED_RELEASE_TABLET | Freq: Two times a day (BID) | ORAL | 0 refills | Status: DC | PRN
Start: 2016-02-16 — End: 2016-04-11

## 2016-02-16 NOTE — Progress Notes (Signed)
    Subjective: ZO:XWRU/CC:knee/ back pain HPI: Conley SimmondsShama J [name] is a 30 y.o. female presenting to clinic today for office visit. Concerns today include:  1. Back pain, low Patient reports this has been present for 4 months.  No history of injury.  Pain is the same as when it started.  She notes that the pain is the worst in the mornings.  She reports difficulty getting out of bed in the morning because she is in pain.  She points to the center of her low back.  She reports she has tried Motrin 800mg  which helped some.  She reports Naproxen helped some too.  Has used Icy/Hot, patch with little relief but did not last.  She notes that she has been doing PT exercises at home with some relief but no improvement in symptoms.  She reports intermittent numbness in left toe but otherwise no numbness or tingling in LE.  No falls/ weakness.  She is s/p hernia repair 09/2015.  2. Right knee pain She reports that pain started a couple of months ago.  She reports no injury.  She reports that she was sitting and when she went to get up she noticed the pain. Points to anterior knee and says it's on the "inside".  She reports stiffness.  She reports popping but no locking.  She endorses sensation of instability of left knee when getting up in am.  She reports that pain seems to get better throughout the day.  She denies swelling, bruising, erythema, heat.  She reports little improvement on NSAIDs for knee.  She has also tried topicals but did not help.    Social History Reviewed: non smoker. FamHx and MedHx reviewed.  Please see EMR. Health Maintenance: Declines flu  ROS: Per HPI  Objective: Office vital signs reviewed. BP 110/70   Pulse 74   Temp 98.3 F (36.8 C) (Oral)   Ht 5\' 6"  (1.676 m)   Wt (!) 344 lb (156 kg)   SpO2 99%   BMI 55.52 kg/m   Physical Examination:  General: Awake, alert, obese, No acute distress Cardio: regular rate, +2 DP Pulm: normal WOB on room air Spine: normal curvature of back,  full AROM, no midline TTP, +lumbar paraspinal TTP, negative seated straight leg test Extremities:  Right knee: full painless AROM, no joint effusion, erythema appreciated, no jointline TTP, negative anterior drawer, no ligamentous laxity appreciated, +Thessaly MSK: Normal gait and station Neuro: Strength and sensation grossly intact  Assessment/ Plan: 30 y.o. female   1. Chronic bilateral low back pain without sciatica.  Likely secondary to obesity and poor core strength. - HEP provided, sports med advisor handout - Ambulatory referral to Orthopedic Surgery - diclofenac (VOLTAREN) 75 MG EC tablet; Take 1 tablet (75 mg total) by mouth 2 (two) times daily as needed.  Dispense: 40 tablet; Refill: 0 - Weight loss encouraged - Could consider back injections if persistent pain despite NSAID treatment  2. Acute pain of right knee, I suspect meniscal injury given positive thessaly.  Knee exam otherwise unremarkable.   - Weight loss - Ambulatory referral to Orthopedic Surgery - diclofenac (VOLTAREN) 75 MG EC tablet; Take 1 tablet (75 mg total) by mouth 2 (two) times daily as needed.  Dispense: 40 tablet; Refill: 0 - Avoid other NSAIDs while taken voltaren. - return precautions reviewed   [name] IpAshly M Denyla Cortese, DO PGY-3, [facility]

## 2016-02-16 NOTE — Patient Instructions (Signed)
Do NOT take Motrin/ ibuprofen/ Aleve/ Naproxen with the medication I have given you.  Eat with the medication.  Low Back Sprain With Rehab A sprain is an injury in which a ligament is torn. The ligaments of the lower back are vulnerable to sprains. However, they are strong and require great force to be injured. These ligaments are important for stabilizing the spinal column. Sprains are classified into three categories. Grade 1 sprains cause pain, but the tendon is not lengthened. Grade 2 sprains include a lengthened ligament, due to the ligament being stretched or partially ruptured. With grade 2 sprains there is still function, although the function may be decreased. Grade 3 sprains involve a complete tear of the tendon or muscle, and function is usually impaired. SYMPTOMS   Severe pain in the lower back.  Sometimes, a feeling of a "pop," "snap," or tear, at the time of injury.  Tenderness and sometimes swelling at the injury site.  Uncommonly, bruising (contusion) within 48 hours of injury.  Muscle spasms in the back. CAUSES  Low back sprains occur when a force is placed on the ligaments that is greater than they can handle. Common causes of injury include:  Performing a stressful act while off-balance.  Repetitive stressful activities that involve movement of the lower back.  Direct hit (trauma) to the lower back. RISK INCREASES WITH:  Contact sports (football, wrestling).  Collisions (major skiing accidents).  Sports that require throwing or lifting (baseball, weightlifting).  Sports involving twisting of the spine (gymnastics, diving, tennis, golf).  Poor strength and flexibility.  Inadequate protection.  Previous back injury or surgery (especially fusion). PREVENTION  Wear properly fitted and padded protective equipment.  Warm up and stretch properly before activity.  Allow for adequate recovery between workouts.  Maintain physical fitness:  Strength,  flexibility, and endurance.  Cardiovascular fitness.  Maintain a healthy body weight. PROGNOSIS  If treated properly, low back sprains usually heal with non-surgical treatment. The length of time for healing depends on the severity of the injury.  RELATED COMPLICATIONS   Recurring symptoms, resulting in a chronic problem.  Chronic inflammation and pain in the low back.  Delayed healing or resolution of symptoms, especially if activity is resumed too soon.  Prolonged impairment.  Unstable or arthritic joints of the low back. TREATMENT  Treatment first involves the use of ice and medicine, to reduce pain and inflammation. The use of strengthening and stretching exercises may help reduce pain with activity. These exercises may be performed at home or with a therapist. Severe injuries may require referral to a therapist for further evaluation and treatment, such as ultrasound. Your caregiver may advise that you wear a back brace or corset, to help reduce pain and discomfort. Often, prolonged bed rest results in greater harm then benefit. Corticosteroid injections may be recommended. However, these should be reserved for the most serious cases. It is important to avoid using your back when lifting objects. At night, sleep on your back on a firm mattress, with a pillow placed under your knees. If non-surgical treatment is unsuccessful, surgery may be needed.  MEDICATION   If pain medicine is needed, nonsteroidal anti-inflammatory medicines (aspirin and ibuprofen), or other minor pain relievers (acetaminophen), are often advised.  Do not take pain medicine for 7 days before surgery.  Prescription pain relievers may be given, if your caregiver thinks they are needed. Use only as directed and only as much as you need.  Ointments applied to the skin may be helpful.  Corticosteroid injections may be given by your caregiver. These injections should be reserved for the most serious cases, because  they may only be given a certain number of times. HEAT AND COLD  Cold treatment (icing) should be applied for 10 to 15 minutes every 2 to 3 hours for inflammation and pain, and immediately after activity that aggravates your symptoms. Use ice packs or an ice massage.  Heat treatment may be used before performing stretching and strengthening activities prescribed by your caregiver, physical therapist, or athletic trainer. Use a heat pack or a warm water soak. SEEK MEDICAL CARE IF:   Symptoms get worse or do not improve in 2 to 4 weeks, despite treatment.  You develop numbness or weakness in either leg.  You lose bowel or bladder function.  Any of the following occur after surgery: fever, increased pain, swelling, redness, drainage of fluids, or bleeding in the affected area.  New, unexplained symptoms develop. (Drugs used in treatment may produce side effects.) EXERCISES  RANGE OF MOTION (ROM) AND STRETCHING EXERCISES - Low Back Sprain Most people with lower back pain will find that their symptoms get worse with excessive bending forward (flexion) or arching at the lower back (extension). The exercises that will help resolve your symptoms will focus on the opposite motion.  Your physician, physical therapist or athletic trainer will help you determine which exercises will be most helpful to resolve your lower back pain. Do not complete any exercises without first consulting with your caregiver. Discontinue any exercises which make your symptoms worse, until you speak to your caregiver. If you have pain, numbness or tingling which travels down into your buttocks, leg or foot, the goal of the therapy is for these symptoms to move closer to your back and eventually resolve. Sometimes, these leg symptoms will get better, but your lower back pain may worsen. This is often an indication of progress in your rehabilitation. Be very alert to any changes in your symptoms and the activities in which you  participated in the 24 hours prior to the change. Sharing this information with your caregiver will allow him or her to most efficiently treat your condition. These exercises may help you when beginning to rehabilitate your injury. Your symptoms may resolve with or without further involvement from your physician, physical therapist or athletic trainer. While completing these exercises, remember:   Restoring tissue flexibility helps normal motion to return to the joints. This allows healthier, less painful movement and activity.  An effective stretch should be held for at least 30 seconds.  A stretch should never be painful. You should only feel a gentle lengthening or release in the stretched tissue. FLEXION RANGE OF MOTION AND STRETCHING EXERCISES: STRETCH - Flexion, Single Knee to Chest   Lie on a firm bed or floor with both legs extended in front of you.  Keeping one leg in contact with the floor, bring your opposite knee to your chest. Hold your leg in place by either grabbing behind your thigh or at your knee.  Pull until you feel a gentle stretch in your low back. Hold __________ seconds.  Slowly release your grasp and repeat the exercise with the opposite side. Repeat __________ times. Complete this exercise __________ times per day.  STRETCH - Flexion, Double Knee to Chest  Lie on a firm bed or floor with both legs extended in front of you.  Keeping one leg in contact with the floor, bring your opposite knee to your chest.  Tense your  stomach muscles to support your back and then lift your other knee to your chest. Hold your legs in place by either grabbing behind your thighs or at your knees.  Pull both knees toward your chest until you feel a gentle stretch in your low back. Hold __________ seconds.  Tense your stomach muscles and slowly return one leg at a time to the floor. Repeat __________ times. Complete this exercise __________ times per day.  STRETCH - Low Trunk  Rotation  Lie on a firm bed or floor. Keeping your legs in front of you, bend your knees so they are both pointed toward the ceiling and your feet are flat on the floor.  Extend your arms out to the side. This will stabilize your upper body by keeping your shoulders in contact with the floor.  Gently and slowly drop both knees together to one side until you feel a gentle stretch in your low back. Hold for __________ seconds.  Tense your stomach muscles to support your lower back as you bring your knees back to the starting position. Repeat the exercise to the other side. Repeat __________ times. Complete this exercise __________ times per day  EXTENSION RANGE OF MOTION AND FLEXIBILITY EXERCISES: STRETCH - Extension, Prone on Elbows   Lie on your stomach on the floor, a bed will be too soft. Place your palms about shoulder width apart and at the height of your head.  Place your elbows under your shoulders. If this is too painful, stack pillows under your chest.  Allow your body to relax so that your hips drop lower and make contact more completely with the floor.  Hold this position for __________ seconds.  Slowly return to lying flat on the floor. Repeat __________ times. Complete this exercise __________ times per day.  RANGE OF MOTION - Extension, Prone Press Ups  Lie on your stomach on the floor, a bed will be too soft. Place your palms about shoulder width apart and at the height of your head.  Keeping your back as relaxed as possible, slowly straighten your elbows while keeping your hips on the floor. You may adjust the placement of your hands to maximize your comfort. As you gain motion, your hands will come more underneath your shoulders.  Hold this position __________ seconds.  Slowly return to lying flat on the floor. Repeat __________ times. Complete this exercise __________ times per day.  RANGE OF MOTION- Quadruped, Neutral Spine   Assume a hands and knees position on a  firm surface. Keep your hands under your shoulders and your knees under your hips. You may place padding under your knees for comfort.  Drop your head and point your tailbone toward the ground below you. This will round out your lower back like an angry cat. Hold this position for __________ seconds.  Slowly lift your head and release your tail bone so that your back sags into a large arch, like an old horse.  Hold this position for __________ seconds.  Repeat this until you feel limber in your low back.  Now, find your "sweet spot." This will be the most comfortable position somewhere between the two previous positions. This is your neutral spine. Once you have found this position, tense your stomach muscles to support your low back.  Hold this position for __________ seconds. Repeat __________ times. Complete this exercise __________ times per day.  STRENGTHENING EXERCISES - Low Back Sprain These exercises may help you when beginning to rehabilitate your injury. These  exercises should be done near your "sweet spot." This is the neutral, low-back arch, somewhere between fully rounded and fully arched, that is your least painful position. When performed in this safe range of motion, these exercises can be used for people who have either a flexion or extension based injury. These exercises may resolve your symptoms with or without further involvement from your physician, physical therapist or athletic trainer. While completing these exercises, remember:   Muscles can gain both the endurance and the strength needed for everyday activities through controlled exercises.  Complete these exercises as instructed by your physician, physical therapist or athletic trainer. Increase the resistance and repetitions only as guided.  You may experience muscle soreness or fatigue, but the pain or discomfort you are trying to eliminate should never worsen during these exercises. If this pain does worsen, stop  and make certain you are following the directions exactly. If the pain is still present after adjustments, discontinue the exercise until you can discuss the trouble with your caregiver. STRENGTHENING - Deep Abdominals, Pelvic Tilt   Lie on a firm bed or floor. Keeping your legs in front of you, bend your knees so they are both pointed toward the ceiling and your feet are flat on the floor.  Tense your lower abdominal muscles to press your low back into the floor. This motion will rotate your pelvis so that your tail bone is scooping upwards rather than pointing at your feet or into the floor. With a gentle tension and even breathing, hold this position for __________ seconds. Repeat __________ times. Complete this exercise __________ times per day.  STRENGTHENING - Abdominals, Crunches   Lie on a firm bed or floor. Keeping your legs in front of you, bend your knees so they are both pointed toward the ceiling and your feet are flat on the floor. Cross your arms over your chest.  Slightly tip your chin down without bending your neck.  Tense your abdominals and slowly lift your trunk high enough to just clear your shoulder blades. Lifting higher can put excessive stress on the lower back and does not further strengthen your abdominal muscles.  Control your return to the starting position. Repeat __________ times. Complete this exercise __________ times per day.  STRENGTHENING - Quadruped, Opposite UE/LE Lift   Assume a hands and knees position on a firm surface. Keep your hands under your shoulders and your knees under your hips. You may place padding under your knees for comfort.  Find your neutral spine and gently tense your abdominal muscles so that you can maintain this position. Your shoulders and hips should form a rectangle that is parallel with the floor and is not twisted.  Keeping your trunk steady, lift your right hand no higher than your shoulder and then your left leg no higher  than your hip. Make sure you are not holding your breath. Hold this position for __________ seconds.  Continuing to keep your abdominal muscles tense and your back steady, slowly return to your starting position. Repeat with the opposite arm and leg. Repeat __________ times. Complete this exercise __________ times per day.  STRENGTHENING - Abdominals and Quadriceps, Straight Leg Raise   Lie on a firm bed or floor with both legs extended in front of you.  Keeping one leg in contact with the floor, bend the other knee so that your foot can rest flat on the floor.  Find your neutral spine, and tense your abdominal muscles to maintain your spinal position  throughout the exercise.  Slowly lift your straight leg off the floor about 6 inches for a count of 15, making sure to not hold your breath.  Still keeping your neutral spine, slowly lower your leg all the way to the floor. Repeat this exercise with each leg __________ times. Complete this exercise __________ times per day. POSTURE AND BODY MECHANICS CONSIDERATIONS - Low Back Sprain Keeping correct posture when sitting, standing or completing your activities will reduce the stress put on different body tissues, allowing injured tissues a chance to heal and limiting painful experiences. The following are general guidelines for improved posture. Your physician or physical therapist will provide you with any instructions specific to your needs. While reading these guidelines, remember:  The exercises prescribed by your provider will help you have the flexibility and strength to maintain correct postures.  The correct posture provides the best environment for your joints to work. All of your joints have less wear and tear when properly supported by a spine with good posture. This means you will experience a healthier, less painful body.  Correct posture must be practiced with all of your activities, especially prolonged sitting and standing. Correct  posture is as important when doing repetitive low-stress activities (typing) as it is when doing a single heavy-load activity (lifting). RESTING POSITIONS Consider which positions are most painful for you when choosing a resting position. If you have pain with flexion-based activities (sitting, bending, stooping, squatting), choose a position that allows you to rest in a less flexed posture. You would want to avoid curling into a fetal position on your side. If your pain worsens with extension-based activities (prolonged standing, working overhead), avoid resting in an extended position such as sleeping on your stomach. Most people will find more comfort when they rest with their spine in a more neutral position, neither too rounded nor too arched. Lying on a non-sagging bed on your side with a pillow between your knees, or on your back with a pillow under your knees will often provide some relief. Keep in mind, being in any one position for a prolonged period of time, no matter how correct your posture, can still lead to stiffness. PROPER SITTING POSTURE In order to minimize stress and discomfort on your spine, you must sit with correct posture. Sitting with good posture should be effortless for a healthy body. Returning to good posture is a gradual process. Many people can work toward this most comfortably by using various supports until they have the flexibility and strength to maintain this posture on their own. When sitting with proper posture, your ears will fall over your shoulders and your shoulders will fall over your hips. You should use the back of the chair to support your upper back. Your lower back will be in a neutral position, just slightly arched. You may place a small pillow or folded towel at the base of your lower back for  support.  When working at a desk, create an environment that supports good, upright posture. Without extra support, muscles tire, which leads to excessive strain on  joints and other tissues. Keep these recommendations in mind: CHAIR:  A chair should be able to slide under your desk when your back makes contact with the back of the chair. This allows you to work closely.  The chair's height should allow your eyes to be level with the upper part of your monitor and your hands to be slightly lower than your elbows. BODY POSITION  Your feet  should make contact with the floor. If this is not possible, use a foot rest.  Keep your ears over your shoulders. This will reduce stress on your neck and low back. INCORRECT SITTING POSTURES  If you are feeling tired and unable to assume a healthy sitting posture, do not slouch or slump. This puts excessive strain on your back tissues, causing more damage and pain. Healthier options include:  Using more support, like a lumbar pillow.  Switching tasks to something that requires you to be upright or walking.  Talking a brief walk.  Lying down to rest in a neutral-spine position. PROLONGED STANDING WHILE SLIGHTLY LEANING FORWARD  When completing a task that requires you to lean forward while standing in one place for a long time, place either foot up on a stationary 2-4 inch high object to help maintain the best posture. When both feet are on the ground, the lower back tends to lose its slight inward curve. If this curve flattens (or becomes too large), then the back and your other joints will experience too much stress, tire more quickly, and can cause pain. CORRECT STANDING POSTURES Proper standing posture should be assumed with all daily activities, even if they only take a few moments, like when brushing your teeth. As in sitting, your ears should fall over your shoulders and your shoulders should fall over your hips. You should keep a slight tension in your abdominal muscles to brace your spine. Your tailbone should point down to the ground, not behind your body, resulting in an over-extended swayback posture.    INCORRECT STANDING POSTURES  Common incorrect standing postures include a forward head, locked knees and/or an excessive swayback. WALKING Walk with an upright posture. Your ears, shoulders and hips should all line-up. PROLONGED ACTIVITY IN A FLEXED POSITION When completing a task that requires you to bend forward at your waist or lean over a low surface, try to find a way to stabilize 3 out of 4 of your limbs. You can place a hand or elbow on your thigh or rest a knee on the surface you are reaching across. This will provide you more stability, so that your muscles do not tire as quickly. By keeping your knees relaxed, or slightly bent, you will also reduce stress across your lower back. CORRECT LIFTING TECHNIQUES DO :  Assume a wide stance. This will provide you more stability and the opportunity to get as close as possible to the object which you are lifting.  Tense your abdominals to brace your spine. Bend at the knees and hips. Keeping your back locked in a neutral-spine position, lift using your leg muscles. Lift with your legs, keeping your back straight.  Test the weight of unknown objects before attempting to lift them.  Try to keep your elbows locked down at your sides in order get the best strength from your shoulders when carrying an object.  Always ask for help when lifting heavy or awkward objects. INCORRECT LIFTING TECHNIQUES DO NOT:   Lock your knees when lifting, even if it is a small object.  Bend and twist. Pivot at your feet or move your feet when needing to change directions.  Assume that you can safely pick up even a paperclip without proper posture.   This information is not intended to replace advice given to you by your health care provider. Make sure you discuss any questions you have with your health care provider.   Document Released: 04/02/2005 Document Revised: 04/23/2014 Document Reviewed:  07/15/2008 [employer] Patient Education [employer].

## 2016-03-05 ENCOUNTER — Ambulatory Visit (INDEPENDENT_AMBULATORY_CARE_PROVIDER_SITE_OTHER): Payer: Self-pay | Admitting: Orthopedic Surgery

## 2016-03-14 ENCOUNTER — Encounter (INDEPENDENT_AMBULATORY_CARE_PROVIDER_SITE_OTHER): Payer: Self-pay | Admitting: Orthopedic Surgery

## 2016-03-14 ENCOUNTER — Ambulatory Visit (INDEPENDENT_AMBULATORY_CARE_PROVIDER_SITE_OTHER): Payer: BLUE CROSS/BLUE SHIELD | Admitting: Orthopedic Surgery

## 2016-03-14 ENCOUNTER — Ambulatory Visit (INDEPENDENT_AMBULATORY_CARE_PROVIDER_SITE_OTHER): Payer: BLUE CROSS/BLUE SHIELD

## 2016-03-14 ENCOUNTER — Ambulatory Visit (INDEPENDENT_AMBULATORY_CARE_PROVIDER_SITE_OTHER): Payer: Self-pay

## 2016-03-14 DIAGNOSIS — M545 Low back pain: Secondary | ICD-10-CM

## 2016-03-14 DIAGNOSIS — M25561 Pain in right knee: Secondary | ICD-10-CM

## 2016-03-14 DIAGNOSIS — G8929 Other chronic pain: Secondary | ICD-10-CM

## 2016-03-14 NOTE — Progress Notes (Signed)
Office Visit Note   Patient: [name] SimmondsShama J [name]           Date of Birth: 04/18/1985           MRN: [id number] Visit Date: 03/14/2016 Requested by: Raliegh IpAshly M Gottschalk, DO [address] [place], [place] [id number] PCP: Delynn FlavinAshly Gottschalk, DO  Subjective: Chief Complaint  Patient presents with  . Lower Back - Pain  . Right Knee - Pain    HPI [name] AxonShama is a 30 year old patient with a four-month history of low back pain.  Denies a history of injury.  She works at [employer] which is a standing job.  Symptoms are really worse in the morning particularly with her back and knee which are very stiff and take a while to get moving.  Just recently started taking diclofenac which has been helping but still has issues in the morning.  Describes midline pain in the back with no radiation or radicular symptoms.  She feels like it's "tearing apart" the first thing in the morning.  Patient also describes right knee pain.  Had 2-3 month history of knee pain which she localizes anteriorly.  She states that she did step wrong off a curb "a while back" otherwise denies a history of injury.  First thing in the morning it's hard for her to get moving.  She states that the knee feels weak like it wants to give way.  She states doesn't sleep well.  Denies any other orthopedic complaints              Review of Systems All systems reviewed are negative as they relate to the chief complaint within the history of present illness.  Patient denies  fevers or chills.    Assessment & Plan: Visit Diagnoses:  1. Chronic midline low back pain without sciatica   2. Chronic pain of right knee     Plan: Impression is low back pain with scoliosis possible facet arthritis with no evidence of radicular symptoms.  Been going on for 4 months and it is affecting her work.  She is an MRI scan with likely epidural steroid injections to follow possibly facet joint injections.  In regards to the right knee she does have valgus alignment no  effusion this is something I would want to watch for now.  We'll get her back straight down first and then look at the knee.  I did talk to her today about necessity of weight loss and general conditioning to avoid long-term issues particularly with her knees  Follow-Up Instructions: Return for after MRI.   Orders:  Orders Placed This Encounter  Procedures  . XR Lumbar Spine 2-3 Views  . XR KNEE 3 VIEW RIGHT   No orders of the defined types were placed in this encounter.     Procedures: No procedures performed   Clinical Data: No additional findings.  Objective: Vital Signs: There were no vitals taken for this visit.  Physical Exam  Constitutional: She appears well-developed.  HENT:  Head: Normocephalic.  Eyes: EOM are normal.  Neck: Normal range of motion.  Cardiovascular: Normal rate.   Pulmonary/Chest: Effort normal.  Neurological: She is alert.  Skin: Skin is warm.  Psychiatric: She has a normal mood and affect.    Ortho Exam on exam the patient has increased body mass index valgus alignment of her lower extremities palpable pedal pulses full range of motion of both knees with mild patellofemoral crepitus.  Collateral patient's are stable.  That is a  difficult exam to say for certain particularly in that right knee.  No groin pain with internal/external rotation of the leg.  In regards to her back she does have some pain with forward lateral bending but no obvious asymmetry or rashes or skin changes noted in the back.  No real trochanteric tenderness is noted.  Motor sensory function in the legs is intact with no nerve root tension signs  Specialty Comments:  No specialty comments available.  Imaging: Xr Knee 3 View Right  Result Date: 03/14/2016 Right knee 3 views AP lateral and merchant reviewed.  Trochlear grooves normal with no subluxation of the patella and there is maintenance of the medial and lateral joint space within the patellar groove trochlear  interface.  Valgus alignment is present.  Joint space is maintained in the medial and lateral compartments upon standing more so in the right knee than the left knee.  In the left knee on the AP there is narrowing in the lateral compartment with early osteophyte formation  Xr Lumbar Spine 2-3 Views  Result Date: 03/14/2016 AP lateral lumbar spine reviewed.  Degenerative changes present at L5-S1 with disc space narrowing.  Lumbar scoliosis is present approximately 20.  Hip joints appear non-arthritic.  Remaining bony pelvis within normal limits.    PMFS History: Patient Active Problem List   Diagnosis Date Noted  . Umbilical hernia 10/14/2015  . Epigastric abdominal pain 03/01/2015  . 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 Low back pain 04/23/2014  . Possible exposure to STD 01/25/2014  . Birth control counseling 01/25/2014  . Essential hypertension, benign 11/16/2013  . Well woman exam 11/16/2013  . Elevated BP 10/08/2013  . Menorrhagia 10/08/2013  . History of iritis 10/08/2013  . Varicose veins of lower extremities with other complications 10/25/2011  . Obesity 10/25/2011   Past Medical History:  Diagnosis Date  . Ankle ulcer (HCC)    right  . Childhood asthma   . Chronic lower back pain   . GERD (gastroesophageal reflux disease)   . Headache    "only when I'm hungry" (10/14/2015)  . Hypertension   . Obesity   . Varicose veins   . VSD (ventricular septal defect)    "had hole in bottom of her heart; closed up on it's own; no OR" /mom 10/14/2015)    Family History  Problem Relation Age of Onset  . Diabetes Mother   . Varicose Veins Mother   . Varicose Veins Sister     Past Surgical History:  Procedure Laterality Date  . HERNIA REPAIR    . INSERTION OF MESH N/A 10/14/2015   Procedure: INSERTION OF MESH;  Surgeon: Avel Peaceodd Rosenbower, MD;  [facility];  Service: General;  Laterality: N/A;  . LAPAROSCOPIC INCISIONAL / UMBILICAL /  VENTRAL HERNIA REPAIR  10/14/2015   VHR w/mesh  . TONSILLECTOMY AND ADENOIDECTOMY  1990s  . TYMPANOSTOMY TUBE PLACEMENT Bilateral 1990s  . VEIN LIGATION AND STRIPPING Right 12/15/2014   Procedure: SAPHENOUS VEIN LIGATION AND STRIPPING; GREATER THAN 20 STAB PHLEBECTOMIES;  Surgeon: Larina Earthlyodd F Early, MD;  [facility];  Service: Vascular;  Laterality: Right;  . VENTRAL HERNIA REPAIR N/A 10/14/2015   Procedure: LAPAROSCOPIC VENTRAL HERNIA;  Surgeon: Avel Peaceodd Rosenbower, MD;  [facility];  Service: General;  Laterality: N/A;   Social History   Occupational History  . Not on file.   Social History Main Topics  . Smoking status: Never Smoker  . Smokeless tobacco:  Never Used  . Alcohol use Yes     Comment: 10/14/2015 "I'll drink some q couple weekends"  . Drug use:     Types: Marijuana     Comment: 10/14/2015 "haven't smoked in approximately 4 weeks"  . Sexual activity: Yes    Birth control/ protection: Condom

## 2016-03-27 NOTE — Addendum Note (Signed)
Addended by: Elvina MattesSIMMONS, Eddie Koc T on: 03/27/2016 04:27 PM   Modules accepted: Orders

## 2016-04-11 ENCOUNTER — Other Ambulatory Visit: Payer: Self-pay | Admitting: Family Medicine

## 2016-04-11 ENCOUNTER — Telehealth: Payer: Self-pay | Admitting: Family Medicine

## 2016-04-11 DIAGNOSIS — M25561 Pain in right knee: Secondary | ICD-10-CM

## 2016-04-11 DIAGNOSIS — G8929 Other chronic pain: Secondary | ICD-10-CM

## 2016-04-11 DIAGNOSIS — M545 Low back pain: Principal | ICD-10-CM

## 2016-04-11 MED ORDER — DICLOFENAC SODIUM 75 MG PO TBEC: 75.0000 mg | DELAYED_RELEASE_TABLET | Freq: Two times a day (BID) | ORAL | 0 refills | Status: DC | PRN

## 2016-04-11 NOTE — Telephone Encounter (Signed)
Voltaren refilled.  I will cc: Tia on this.  Though am unsure that she will be able to find anyone that will be able to get her in sooner than her own orthopedist.

## 2016-04-11 NOTE — Telephone Encounter (Signed)
Pt called and would like to have a refill on her pain medication since she is having a lot of issues getting an appointment with the ortho doctor since they are always busy and backed up. She would like us to find another ortho doctor that would have more availability for her so that she doesn't keep having this pain. jw

## 2016-04-12 NOTE — Telephone Encounter (Signed)
Spoke to pt

## 2016-04-12 NOTE — Telephone Encounter (Signed)
Spoke to pt. She is going to call [facility] to see if they can help her get an appt either at [facility] or [facility]. Sunday SpillersSharon T Trigo Winterbottom, CMA

## 2016-04-12 NOTE — Telephone Encounter (Signed)
I am unsure as well. I am also uncertain of patient's availability and why she feels [facility] is unable to accommodate her. Their normally schedule sam day appts if needed. I would need more clarification about exactly what patient is needing.

## 2016-04-25 ENCOUNTER — Ambulatory Visit
Admission: RE | Admit: 2016-04-25 | Discharge: 2016-04-25 | Disposition: A | Discharge status: Home / Self Care | Payer: BLUE CROSS/BLUE SHIELD | Source: Ambulatory Visit | Attending: Orthopedic Surgery | Admitting: Orthopedic Surgery

## 2016-04-25 DIAGNOSIS — M545 Low back pain: Principal | ICD-10-CM

## 2016-04-25 DIAGNOSIS — M5126 Other intervertebral disc displacement, lumbar region: Secondary | ICD-10-CM | POA: Diagnosis not present

## 2016-04-25 DIAGNOSIS — G8929 Other chronic pain: Secondary | ICD-10-CM

## 2016-05-22 ENCOUNTER — Telehealth (INDEPENDENT_AMBULATORY_CARE_PROVIDER_SITE_OTHER): Payer: Self-pay | Admitting: Orthopedic Surgery

## 2016-05-22 NOTE — Telephone Encounter (Signed)
Pt , Has mild disc bulging  Without nerve compression Does not need surgery May benefit from injection but hard to say for sure If She wants an injection please have her see Alvester Morinewton otherwise we'll see her back as needed - pt also an option but may not be practical pls call thx

## 2016-05-22 NOTE — Telephone Encounter (Signed)
PATIENT CALLED NEEDING HER MRI RESULTS. CB # [phone]

## 2016-05-22 NOTE — Telephone Encounter (Signed)
IC pt and adivsed, she will call us if she wants to proceed with injection or PT, she has pain pills from her PCP and will take those.

## 2016-05-22 NOTE — Telephone Encounter (Signed)
Patient does not have a return appt.

## 2016-06-15 ENCOUNTER — Telehealth: Payer: Self-pay | Admitting: Family Medicine

## 2016-06-15 ENCOUNTER — Other Ambulatory Visit: Payer: Self-pay | Admitting: Family Medicine

## 2016-06-15 DIAGNOSIS — M25561 Pain in right knee: Secondary | ICD-10-CM

## 2016-06-15 DIAGNOSIS — M545 Low back pain: Principal | ICD-10-CM

## 2016-06-15 DIAGNOSIS — G8929 Other chronic pain: Secondary | ICD-10-CM

## 2016-06-15 MED ORDER — DICLOFENAC SODIUM 75 MG PO TBEC: 75.0000 mg | DELAYED_RELEASE_TABLET | Freq: Two times a day (BID) | ORAL | 0 refills | Status: DC | PRN

## 2016-06-15 NOTE — Telephone Encounter (Signed)
pt calling to request refill of:  Name of Medication(s):  ciclofrnac Last date of OV: 02-16-16 Pharmacy: [facility] Will route refill request to Clinic RN.  Discussed with patient policy to call pharmacy for future refills.  Also, discussed refills may take up to 48 hours to approve or deny.  Avanell ShackletonHarriet C Shelton

## 2016-06-15 NOTE — Telephone Encounter (Signed)
Refill for diclofenac sent in.  Please have patient follow up for future fills.

## 2016-06-15 NOTE — Telephone Encounter (Signed)
f °

## 2016-06-18 NOTE — Telephone Encounter (Signed)
Pt informed. Sharon T Saunders, CMA  

## 2016-07-12 ENCOUNTER — Ambulatory Visit (HOSPITAL_COMMUNITY)
Admission: EM | Admit: 2016-07-12 | Discharge: 2016-07-12 | Disposition: A | Discharge status: Home / Self Care | Payer: BLUE CROSS/BLUE SHIELD | Attending: Internal Medicine | Admitting: Internal Medicine

## 2016-07-12 ENCOUNTER — Encounter (HOSPITAL_COMMUNITY): Payer: Self-pay | Admitting: Emergency Medicine

## 2016-07-12 DIAGNOSIS — M25561 Pain in right knee: Secondary | ICD-10-CM

## 2016-07-12 DIAGNOSIS — G8929 Other chronic pain: Secondary | ICD-10-CM

## 2016-07-12 MED ORDER — PREDNISONE 50 MG PO TABS: ORAL_TABLET | ORAL | 0 refills | Status: DC

## 2016-07-12 MED ORDER — KETOROLAC TROMETHAMINE 60 MG/2ML IM SOLN
60.0000 mg | Freq: Once | INTRAMUSCULAR | Status: AC
  Administered 2016-07-12: 60 mg via INTRAMUSCULAR

## 2016-07-12 MED ORDER — KETOROLAC TROMETHAMINE 60 MG/2ML IM SOLN
INTRAMUSCULAR | Status: AC
  Filled 2016-07-12: qty 2

## 2016-07-12 NOTE — Discharge Instructions (Signed)
You have been given an injection of Toradol in clinic tonight, and started on a medicine called prednisone. Take one tablet daily with food. I advised you to continue to take your diclofenac as prescribed, and follow up with your primary care provider at your next appointment

## 2016-07-12 NOTE — ED Triage Notes (Signed)
Here for RLE pain onset 4 days (behind knee)  Pain increases w/activty.... Hx of vein ligation and stripping to the RLE  Pt sts she weighs 332 lbs   Denies inj/trauma  Has appt w/PCP next Thursday   Slow steady gait.... A&O x4... NAD

## 2016-07-12 NOTE — ED Provider Notes (Signed)
CSN: [id number]     Arrival date & time 07/12/16  1928 History   None    Chief Complaint  Patient presents with  . Leg Pain   (Consider location/radiation/quality/duration/timing/severity/associated sxs/prior Treatment) 31 year old female presents with a chief complaint of knee pain ongoing for 4 months is currently being treated with diclofenac. She has had an x-ray of her knee, with normal results. She states her pain has been relatively controlled with the diclofenac, however has worsened. She has an appointment coming up next week with her primary care provider for evaluation of this pain.   The history is provided by the patient.    Past Medical History:  Diagnosis Date  . Ankle ulcer (HCC)    right  . Childhood asthma   . Chronic lower back pain   . GERD (gastroesophageal reflux disease)   . Headache    "only when I'm hungry" (10/14/2015)  . Hypertension   . Obesity   . Varicose veins   . VSD (ventricular septal defect)    "had hole in bottom of her heart; closed up on it's own; no OR" /mom 10/14/2015)   Past Surgical History:  Procedure Laterality Date  . HERNIA REPAIR    . INSERTION OF MESH N/A 10/14/2015   Procedure: INSERTION OF MESH;  Surgeon: Avel Peaceodd Rosenbower, MD;  [facility];  Service: General;  Laterality: N/A;  . LAPAROSCOPIC INCISIONAL / UMBILICAL / VENTRAL HERNIA REPAIR  10/14/2015   VHR w/mesh  . TONSILLECTOMY AND ADENOIDECTOMY  1990s  . TYMPANOSTOMY TUBE PLACEMENT Bilateral 1990s  . VEIN LIGATION AND STRIPPING Right 12/15/2014   Procedure: SAPHENOUS VEIN LIGATION AND STRIPPING; GREATER THAN 20 STAB PHLEBECTOMIES;  Surgeon: Larina Earthlyodd F Early, MD;  [facility];  Service: Vascular;  Laterality: Right;  . VENTRAL HERNIA REPAIR N/A 10/14/2015   Procedure: LAPAROSCOPIC VENTRAL HERNIA;  Surgeon: Avel Peaceodd Rosenbower, MD;  [facility];  Service: General;  Laterality: N/A;   Family History  Problem Relation Age of Onset  . Diabetes Mother   . Varicose Veins Mother    . Varicose Veins Sister    Social History  Substance Use Topics  . Smoking status: Never Smoker  . Smokeless tobacco: Never Used  . Alcohol use Yes     Comment: 10/14/2015 "I'll drink some q couple weekends"   OB History    No data available     Review of Systems  Constitutional: Negative for activity change, chills and fatigue.  HENT: Negative.   Respiratory: Negative for choking, chest tightness, shortness of breath, wheezing and stridor.   Cardiovascular: Negative for chest pain, palpitations and leg swelling.  Gastrointestinal: Negative for abdominal pain, constipation, diarrhea, nausea and vomiting.  Genitourinary: Negative.   Musculoskeletal: Negative for back pain, gait problem, joint swelling, neck pain and neck stiffness.  Neurological: Negative.   All other systems reviewed and are negative.   Allergies  Tramadol  Home Medications   Prior to Admission medications   Medication Sig Start Date End Date Taking? Authorizing Provider  diclofenac (VOLTAREN) 75 MG EC tablet Take 1 tablet (75 mg total) by mouth 2 (two) times daily as needed. 06/15/16   Ashly Hulen SkainsM Gottschalk, DO  hydrochlorothiazide (HYDRODIURIL) 12.5 MG tablet Take 1 tablet (12.5 mg total) by mouth daily. 09/21/15   Jericho N Rumley, DO  Multiple Vitamin (MULTIVITAMIN) tablet Take 2 tablets by mouth daily. Vitafusion gummies    Historical Provider, MD  predniSONE (DELTASONE) 50 MG tablet Take 1 tablet daily with food 07/12/16  Dorena Bodo, NP   Meds Ordered and Administered this Visit   Medications  ketorolac (TORADOL) injection 60 mg (60 mg Intramuscular Given 07/12/16 2019)    BP 111/81 (BP Location: Left Wrist)   Pulse 100   Temp 98.9 F (37.2 C) (Oral)   Resp 18   Wt (!) 332 lb (150.6 kg)   LMP 07/10/2016   SpO2 100%   BMI 53.59 kg/m  No data found.   Physical Exam  Constitutional: She is oriented to person, place, and time. She appears well-developed and well-nourished. No distress.   HENT:  Head: Normocephalic and atraumatic.  Right Ear: External ear normal.  Left Ear: External ear normal.  Musculoskeletal:       Right knee: She exhibits normal range of motion, no swelling, no effusion, no deformity, normal alignment, no LCL laxity, normal patellar mobility and no MCL laxity. No tenderness found. No medial joint line, no lateral joint line, no MCL, no LCL and no patellar tendon tenderness noted.  Exam of the right knee was unremarkable  Neurological: She is alert and oriented to person, place, and time.  Skin: Skin is warm and dry. Capillary refill takes less than 2 seconds. No rash noted. She is not diaphoretic. No erythema.  Psychiatric: She has a normal mood and affect. Her behavior is normal.  Nursing note and vitals reviewed.   Urgent Care Course     Procedures (including critical care time)  Labs Review Labs Reviewed - No data to display  Imaging Review No results found.      MDM   1. Chronic pain of right knee     No swelling in the ankle, no color change, no palpable cord, negative Homans sign, index of suspicion for clot is low. Encouraged her to follow-up, and keep her appointment with her primary care provider, given injection of Toradol in clinic, along with a short course of steroids.     Dorena Bodo, NP 07/12/16 2024

## 2016-07-13 ENCOUNTER — Encounter (HOSPITAL_COMMUNITY): Payer: Self-pay

## 2016-07-13 ENCOUNTER — Emergency Department (HOSPITAL_COMMUNITY)
Admission: EM | Admit: 2016-07-13 | Discharge: 2016-07-13 | Disposition: A | Discharge status: Home / Self Care | Payer: BLUE CROSS/BLUE SHIELD | Attending: Emergency Medicine | Admitting: Emergency Medicine

## 2016-07-13 DIAGNOSIS — I10 Essential (primary) hypertension: Secondary | ICD-10-CM | POA: Diagnosis not present

## 2016-07-13 DIAGNOSIS — M25561 Pain in right knee: Secondary | ICD-10-CM | POA: Insufficient documentation

## 2016-07-13 DIAGNOSIS — J45909 Unspecified asthma, uncomplicated: Secondary | ICD-10-CM | POA: Insufficient documentation

## 2016-07-13 DIAGNOSIS — G8929 Other chronic pain: Secondary | ICD-10-CM

## 2016-07-13 NOTE — Discharge Instructions (Signed)
Alternate between ice and heat, and elevate knee throughout the day, using heat/ice pack for no more than 20 minutes every hour. Continue using your usual home medications, as well as the prednisone given to you yesterday. Use additional tylenol as needed for additional pain relief. Try losing weight. Follow up with your regular doctor and/or your  orthopedist in the next 1-2 weeks for recheck of symptoms and ongoing management of your leg pain. Return to the ER for changes or worsening symptoms.

## 2016-07-13 NOTE — ED Triage Notes (Signed)
PT C/O RIGHT LEG PAIN X1 WEEK. PT STS SHE WAS SEEN FOR THE SAME PAIN 4 MONTHS AGO, AND IT RETURNED LAST WEEK W/O INJURY. PT STS SHE WAS SEEN AT AN [facility] LAST NIGHT AND GIVEN RX W/O RELIEF. PT ALSO STS SHE HAD VEIN SX TO THE SAME LEG IN 2016.

## 2016-07-13 NOTE — ED Notes (Signed)
PT DISCHARGED. INSTRUCTIONS GIVEN. AAOX4. PT IN NO APPARENT DISTRESS. THE OPPORTUNITY TO ASK QUESTIONS WAS PROVIDED. 

## 2016-07-13 NOTE — ED Provider Notes (Signed)
[facility]-EMERGENCY DEPT Provider Note   CSN: [id number] Arrival date & time: 07/13/16  1658   By signing my name below, I, [name], attest that this documentation has been prepared under the direction and in the presence of [address], [employer]. Electronically Signed: [name], Scribe. 07/13/16. 5:46 PM.   History   Chief Complaint Chief Complaint  Patient presents with  . Leg Pain    RIGHT   The history is provided by the patient and medical records. No language interpreter was used.  Leg Pain   This is a chronic problem. The current episode started more than 1 week ago. The problem occurs constantly. The problem has not changed since onset.The pain is present in the right knee. The quality of the pain is described as pounding and constant. The pain is at a severity of 6/10. The pain is mild. Associated symptoms include limited range of motion (due to pain). Pertinent negatives include no numbness and no stiffness. The symptoms are aggravated by standing, activity and contact. She has tried arthritis medications for the symptoms. The treatment provided mild relief. There has been no history of extremity trauma. Family history is significant for no rheumatoid arthritis and no gout.    [name] is a 31 y.o. female with a PMHx of of chronic knee pain and R leg vein surgery, who presents to the ED with complaints of acute on chronic R knee pain x 1 week. Chart review reveals pt was seen yesterday at [facility] where she was given a 60 mg IM Toradol injection and an Rx for prednisone  x6 days, which reportedly has provided some mild relief. Further chart review reveals that she was seen by Dr. August Saucer (ortho) on 03/12/16 pt received an X-Ray on the knee with unremarkable results. She states that her knee has gradually become more painful, but denies any injuries or trauma. She describes the pain as  6/10, constant, throbbing lateral and posterior R knee pain, non radiating, worsened  with walking and bending the knee, mildly relieved with her Toradol injection yesterday and prednisone rx, and not modified with diclofenac. She notes upcoming appointment with her PCP on 07/19/2016. Pt denies recent injury, clicks or pops in the affected knee, redness, warmth, swelling, bruising, LE swelling, estrogen use, FMHx or PMHx of blood clots, extended travel, surgery, immobilizations, fevers, chills, CP, SOB, abd pain, N/V/D/C, hematuria, dysuria, numbness, tingling, focal weakness, or any other complaints at this time.    Past Medical History:  Diagnosis Date  . Ankle ulcer (HCC)    right  . Childhood asthma   . Chronic lower back pain   . GERD (gastroesophageal reflux disease)   . Headache    "only when I'm hungry" (10/14/2015)  . Hypertension   . Obesity   . Varicose veins   . VSD (ventricular septal defect)    "had hole in bottom of her heart; closed up on it's own; no OR" /mom 10/14/2015)    Patient Active Problem List   Diagnosis Date Noted  . Umbilical hernia 10/14/2015  . Epigastric abdominal pain 03/01/2015  . 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 Low back pain 04/23/2014  . Possible exposure to STD 01/25/2014  . Birth control counseling 01/25/2014  . Essential hypertension, benign 11/16/2013  . Well woman exam 11/16/2013  . Elevated BP 10/08/2013  . Menorrhagia 10/08/2013  . History of iritis 10/08/2013  . Varicose veins of lower extremities with other  complications 10/25/2011  . Obesity 10/25/2011    Past Surgical History:  Procedure Laterality Date  . HERNIA REPAIR    . INSERTION OF MESH N/A 10/14/2015   Procedure: INSERTION OF MESH;  Surgeon: Avel Peace, MD;  Location: [facility] OR;  Service: General;  Laterality: N/A;  . LAPAROSCOPIC INCISIONAL / UMBILICAL / VENTRAL HERNIA REPAIR  10/14/2015   VHR w/mesh  . TONSILLECTOMY AND ADENOIDECTOMY  1990s  . TYMPANOSTOMY TUBE PLACEMENT Bilateral 1990s  . VEIN LIGATION AND  STRIPPING Right 12/15/2014   Procedure: SAPHENOUS VEIN LIGATION AND STRIPPING; GREATER THAN 20 STAB PHLEBECTOMIES;  Surgeon: Larina Earthly, MD;  Location: [facility] OR;  Service: Vascular;  Laterality: Right;  . VENTRAL HERNIA REPAIR N/A 10/14/2015   Procedure: LAPAROSCOPIC VENTRAL HERNIA;  Surgeon: Avel Peace, MD;  Location: [facility] OR;  Service: General;  Laterality: N/A;    OB History    No data available       Home Medications    Prior to Admission medications   Medication Sig Start Date End Date Taking? Authorizing Provider  diclofenac (VOLTAREN) 75 MG EC tablet Take 1 tablet (75 mg total) by mouth 2 (two) times daily as needed. 06/15/16   Ashly Hulen Skains, DO  hydrochlorothiazide (HYDRODIURIL) 12.5 MG tablet Take 1 tablet (12.5 mg total) by mouth daily. 09/21/15   Starks N Rumley, DO  Multiple Vitamin (MULTIVITAMIN) tablet Take 2 tablets by mouth daily. Vitafusion gummies    Historical Provider, MD  predniSONE (DELTASONE) 50 MG tablet Take 1 tablet daily with food 07/12/16   Dorena Bodo, NP    Family History Family History  Problem Relation Age of Onset  . Diabetes Mother   . Varicose Veins Mother   . Varicose Veins Sister     Social History Social History  Substance Use Topics  . Smoking status: Never Smoker  . Smokeless tobacco: Never Used  . Alcohol use Yes     Comment: 10/14/2015 "I'll drink some q couple weekends"     Allergies   Tramadol   Review of Systems Review of Systems  Constitutional: Negative for chills and fever.  Respiratory: Negative for shortness of breath.   Cardiovascular: Negative for chest pain and leg swelling.  Gastrointestinal: Negative for abdominal pain, constipation, diarrhea, nausea and vomiting.  Genitourinary: Negative for dysuria and hematuria.  Musculoskeletal: Positive for arthralgias. Negative for joint swelling, myalgias and stiffness.  Skin: Negative for color change.  Allergic/Immunologic: Negative for immunocompromised state.    Neurological: Negative for weakness and numbness.  Psychiatric/Behavioral: Negative for confusion.  10 Systems reviewed and are negative for acute change except as noted in the HPI.    Physical Exam Updated Vital Signs BP 130/66 (BP Location: Left Arm)   Pulse 88   Temp 98.5 F (36.9 C) (Oral)   Ht  (1.676 m)   Wt (!) 332 lb (150.6 kg)   LMP 07/10/2016   SpO2 96%   BMI 53.59 kg/m   Physical Exam  Constitutional: She is oriented to person, place, and time. Vital signs are normal. She appears well-developed and well-nourished.  Non-toxic appearance. No distress.  Afebrile, nontoxic, NAD  HENT:  Head: Normocephalic and atraumatic.  Mouth/Throat: Mucous membranes are normal.  Eyes: Conjunctivae and EOM are normal. Right eye exhibits no discharge. Left eye exhibits no discharge.  Neck: Normal range of motion. Neck supple.  Cardiovascular: Normal rate and intact distal pulses.   Pulmonary/Chest: Effort normal. No respiratory distress.  Abdominal: Normal appearance. She exhibits no distension.  Musculoskeletal:       Right knee: She exhibits normal range of motion, no swelling, no effusion, no deformity, normal alignment, no LCL laxity, normal patellar mobility and no MCL laxity. Tenderness found. Lateral joint line tenderness noted.  Morbidly obese, which mildly limits exam. R knee with FROM intact, with mild lateral joint line TTP, no swelling/effusion/deformity, unable to assess skin coloration d/t pt wearing jeans, no warmth, no abnormal alignment or patellar mobility, no varus/valgus laxity, neg anterior drawer test, no crepitus. No pedal edema, negative homan's sign. No palpable baker's cyst Strength and sensation grossly intact, distal pulses intact, compartments soft   Neurological: She is alert and oriented to person, place, and time. She has normal strength. No sensory deficit.  Skin: Skin is warm, dry and intact. No rash noted.  Psychiatric: She has a normal mood and  affect. Her behavior is normal.  Nursing note and vitals reviewed.    ED Treatments / Results  DIAGNOSTIC STUDIES: Oxygen Saturation is 96% on RA, adequate by my interpretation.    COORDINATION OF CARE: 5:39 PM Discussed treatment plan with pt at bedside and pt agreed to plan. Pt advised of symptomatic care instructions and advised to F/U with PCP.  Labs (all labs ordered are listed, but only abnormal results are displayed) Labs Reviewed - No data to display  EKG  EKG Interpretation None       Radiology No results found.  Procedures Procedures (including critical care time)  Medications Ordered in ED Medications - No data to display   Initial Impression / Assessment and Plan / ED Course  I have reviewed the triage vital signs and the nursing notes.  Pertinent labs & imaging results that were available during my care of the patient were reviewed by me and considered in my medical decision making (see chart for details).     31 y.o. female here with acute on chronic R knee pain. Mild tenderness to lateral joint line; morbidly obese which limits exam, but no definite swelling/effusion noted, NVI with soft compartments, no palpable baker's cyst. No evidence of DVT, no risk factors for such. Xrays last year were unremarkable per chart review. Given toradol IM yesterday and rx prednisone x6 days, which helped. Discussed that this is likely related to obesity causing stress to joint, could be meniscal/ligamentous injury however doubt need for further emergent w/up. Advised continuation of meds, use of tylenol/ice/heat, wt loss advised, and f/up with ortho and PCP for ongoing management. I explained the diagnosis and have given explicit precautions to return to the ER including for any other new or worsening symptoms. The patient understands and accepts the medical plan as it's been dictated and I have answered their questions. Discharge instructions concerning home care and  prescriptions have been given. The patient is STABLE and is discharged to home in good condition.   I personally performed the services described in this documentation, which was scribed in my presence. The recorded information has been reviewed and is accurate.   Final Clinical Impressions(s) / ED Diagnoses   Final diagnoses:  Chronic pain of right knee    New Prescriptions New Prescriptions   No medications on file     [address], PA-C 07/13/16 1754    Raeford Razor, MD 07/14/16 0100

## 2016-07-19 ENCOUNTER — Ambulatory Visit (INDEPENDENT_AMBULATORY_CARE_PROVIDER_SITE_OTHER): Payer: BLUE CROSS/BLUE SHIELD | Admitting: Family Medicine

## 2016-07-19 ENCOUNTER — Encounter: Payer: Self-pay | Admitting: Family Medicine

## 2016-07-19 VITALS — BP 98/70 | HR 76 | Temp 98.0°F | Ht 66.0 in | Wt 357.4 lb

## 2016-07-19 DIAGNOSIS — I952 Hypotension due to drugs: Secondary | ICD-10-CM | POA: Diagnosis not present

## 2016-07-19 DIAGNOSIS — Z6841 Body Mass Index (BMI) 40.0 and over, adult: Secondary | ICD-10-CM

## 2016-07-19 DIAGNOSIS — M25561 Pain in right knee: Secondary | ICD-10-CM | POA: Diagnosis not present

## 2016-07-19 DIAGNOSIS — IMO0001 Reserved for inherently not codable concepts without codable children: Secondary | ICD-10-CM

## 2016-07-19 DIAGNOSIS — E6609 Other obesity due to excess calories: Secondary | ICD-10-CM

## 2016-07-19 LAB — POCT GLYCOSYLATED HEMOGLOBIN (HGB A1C): Hemoglobin A1C: 5.7

## 2016-07-19 MED ORDER — DICLOFENAC SODIUM 75 MG PO TBEC: 75.0000 mg | DELAYED_RELEASE_TABLET | Freq: Two times a day (BID) | ORAL | 2 refills | Status: DC | PRN

## 2016-07-19 NOTE — Progress Notes (Signed)
Subjective: CC: right knee pain HPI: [name] is a 31 y.o. female presenting to clinic today for:  1. Right knee pain Patient reports flare of right knee pain over the last week.  She reports that Voltaren usually controls pain but she had to go to UC on 3/29 because her knee was hurting.  She points to the lateral aspect of her knee as the place where the pain was most prominent.  Pain is described as throbbing.  She was treated with Prednisone burst and Toradol injection at that visit.  She reports she went to ED the following day, as pain was persistent.  She notes that she saw orthopedic a few months ago.  She had xrays done which did not reveal any abnormalities to explain pain.  She reports pain has improved since Prednisone was started and declines joint injection today.  2. Hypertension She reports that she has had intermittent dizziness and feeling poorly lately.  She reports good PO intake.  She wonders if she is having abnormalities in her blood sugar, specifically diabetes. Blood pressure today: 98/70 Meds: Compliant with HCTZ 12.5mg  ROS: Reports dizziness.  Denies headache, visual changes, nausea, vomiting, chest pain, abdominal pain or shortness of breath.  Social Hx reviewed. MedHx, medications and allergies reviewed.  Please see EMR. ROS: Per HPI  Objective: Office vital signs reviewed. BP 98/70   Pulse 76   Temp 98 F (36.7 C) (Oral)   Ht  (1.676 m)   Wt (!) 357 lb 6.4 oz (162.1 kg)   LMP 07/10/2016   SpO2 99%   BMI 57.69 kg/m   Physical Examination:  General: Awake, alert, morbidly obese, No acute distress Cardio: regular rate and rhythm, S1S2 heard, no murmurs appreciated Pulm: clear to auscultation bilaterally, no wheezes, rhonchi or rales; normal work of breathing on room air Extremities: warm, well perfused, No edema, cyanosis or clubbing; +2 pulses bilaterally  Right knee: obese, no erythema, effusion or palpable bony abnormality.  Patient  has full painless AROM today.  No ligamentous laxity.  No crepitus MSK: normal gait and normal station  Results for orders placed or performed in visit on 07/19/16 (from the past 24 hour(s))  POCT glycosylated hemoglobin (Hb A1C)     Status: None   Collection Time: 07/19/16  4:52 PM  Result Value Ref Range   Hemoglobin A1C 5.7      Assessment/ Plan: 31 y.o. female   1. Acute pain of right knee.  Acute pain flare improved.  We discussed that weight loss would be key in reducing pain in this knee.  I suspect that she has thinned meniscus that may be playing a part in her symptoms.  Knee xray report from her ortho visit reviewed, which was essentially normal and did not provide an explanation for her symptoms.  Her exam was unremarkable today.  - diclofenac (VOLTAREN) 75 MG EC tablet; Take 1 tablet (75 mg total) by mouth 2 (two) times daily as needed.  Dispense: 60 tablet; Refill: 2  2. Class 3 obesity due to excess calories with body mass index (BMI) of 50.0 to 59.9 in adult, unspecified whether serious comorbidity present (HCC).  A1c 5.7 today.  - POCT glycosylated hemoglobin (Hb A1C)  3. Hypotension due to drugs.  Her BP is low today.  She has been symptomatic.  I suspect that she is have hypotension related to HCTZ use. - I advised that she hold her HCTZ for next few days and monitor  BP - She will call on Tuesday with her measurements and I will decide if she should continue HCTZ at that time.  Follow up in next couple of weeks for annual exam.  Raliegh Ip, DO PGY-3, [facility]

## 2016-07-26 ENCOUNTER — Telehealth: Payer: Self-pay | Admitting: Family Medicine

## 2016-07-26 NOTE — Telephone Encounter (Signed)
Pt states she is unable to get rid of chest congestion and would like to have something called into [facility] [place]. ep

## 2016-07-26 NOTE — Telephone Encounter (Signed)
Spoke to patient on the phone.  Advised that she stop taking the HCTZ.  BPs look good off of medication.  She is concerned about LE swelling in the absence of HCTZ.  I recommended that she watch salt intake, elevate LE and keep active.  Recommended Mucinex for chest congestion.  Patient to follow up if cold symptoms worsen.

## 2016-07-26 NOTE — Telephone Encounter (Signed)
Pt's BP readings: 10th- 126/73 pulse 82 11th- 120/73 pulse 111 12th- 136/85 pulse 88  Pt would like PCP to call her with the next step. ep

## 2016-07-26 NOTE — Telephone Encounter (Signed)
Will forward to PCP.  Jerol Rufener L, RN  

## 2016-08-13 ENCOUNTER — Ambulatory Visit: Payer: BLUE CROSS/BLUE SHIELD | Admitting: Family Medicine

## 2016-08-22 ENCOUNTER — Other Ambulatory Visit (HOSPITAL_COMMUNITY)
Admission: RE | Admit: 2016-08-22 | Discharge: 2016-08-22 | Disposition: A | Discharge status: Home / Self Care | Payer: BLUE CROSS/BLUE SHIELD | Source: Ambulatory Visit | Attending: Family Medicine | Admitting: Family Medicine

## 2016-08-22 ENCOUNTER — Encounter: Payer: Self-pay | Admitting: Family Medicine

## 2016-08-22 ENCOUNTER — Ambulatory Visit (INDEPENDENT_AMBULATORY_CARE_PROVIDER_SITE_OTHER): Payer: BLUE CROSS/BLUE SHIELD | Admitting: Family Medicine

## 2016-08-22 VITALS — BP 110/70 | HR 80 | Temp 98.4°F | Ht 66.0 in | Wt 363.0 lb

## 2016-08-22 DIAGNOSIS — N926 Irregular menstruation, unspecified: Secondary | ICD-10-CM | POA: Diagnosis not present

## 2016-08-22 DIAGNOSIS — N760 Acute vaginitis: Secondary | ICD-10-CM

## 2016-08-22 DIAGNOSIS — Z124 Encounter for screening for malignant neoplasm of cervix: Secondary | ICD-10-CM | POA: Insufficient documentation

## 2016-08-22 DIAGNOSIS — Z01419 Encounter for gynecological examination (general) (routine) without abnormal findings: Secondary | ICD-10-CM | POA: Diagnosis not present

## 2016-08-22 DIAGNOSIS — I10 Essential (primary) hypertension: Secondary | ICD-10-CM

## 2016-08-22 DIAGNOSIS — B9689 Other specified bacterial agents as the cause of diseases classified elsewhere: Secondary | ICD-10-CM

## 2016-08-22 DIAGNOSIS — E66813 Obesity, class 3: Secondary | ICD-10-CM

## 2016-08-22 DIAGNOSIS — Z202 Contact with and (suspected) exposure to infections with a predominantly sexual mode of transmission: Secondary | ICD-10-CM | POA: Diagnosis not present

## 2016-08-22 DIAGNOSIS — R0683 Snoring: Secondary | ICD-10-CM

## 2016-08-22 DIAGNOSIS — Z6841 Body Mass Index (BMI) 40.0 and over, adult: Secondary | ICD-10-CM | POA: Diagnosis not present

## 2016-08-22 LAB — POCT WET PREP (WET MOUNT)
CLUE CELLS WET PREP WHIFF POC: POSITIVE
TRICHOMONAS WET PREP HPF POC: ABSENT

## 2016-08-22 MED ORDER — LORATADINE 10 MG PO TABS: 10.0000 mg | ORAL_TABLET | Freq: Every day | ORAL | 11 refills | Status: DC

## 2016-08-22 MED ORDER — METRONIDAZOLE 500 MG PO TABS: 500.0000 mg | ORAL_TABLET | Freq: Two times a day (BID) | ORAL | 0 refills | Status: DC

## 2016-08-22 NOTE — Assessment & Plan Note (Signed)
Well controlled on HCTZ 12.5mg .  Patient has not done sleep study yet.  Undiagnosed OSA may contribute to HTN.

## 2016-08-22 NOTE — Patient Instructions (Signed)
I will contact you will the results of your labs.  If anything is abnormal, I will call you.  Otherwise, expect a copy to be mailed to you.  I have reordered your sleep study.  Please try and have this done.     Preventive Care 18-39 Years, Female Preventive care refers to lifestyle choices and visits with your health care provider that can promote health and wellness. What does preventive care include?  A yearly physical exam. This is also called an annual well check.  Dental exams once or twice a year.  Routine eye exams. Ask your health care provider how often you should have your eyes checked.  Personal lifestyle choices, including:  Daily care of your teeth and gums.  Regular physical activity.  Eating a healthy diet.  Avoiding tobacco and drug use.  Limiting alcohol use.  Practicing safe sex.  Taking vitamin and mineral supplements as recommended by your health care provider. What happens during an annual well check? The services and screenings done by your health care provider during your annual well check will depend on your age, overall health, lifestyle risk factors, and family history of disease. Counseling  Your health care provider may ask you questions about your:  Alcohol use.  Tobacco use.  Drug use.  Emotional well-being.  Home and relationship well-being.  Sexual activity.  Eating habits.  Work and work Statistician.  Method of birth control.  Menstrual cycle.  Pregnancy history. Screening  You may have the following tests or measurements:  Height, weight, and BMI.  Diabetes screening. This is done by checking your blood sugar (glucose) after you have not eaten for a while (fasting).  Blood pressure.  Lipid and cholesterol levels. These may be checked every 5 years starting at age 78.  Skin check.  Hepatitis C blood test.  Hepatitis B blood test.  Sexually transmitted disease (STD) testing.  BRCA-related cancer screening.  This may be done if you have a family history of breast, ovarian, tubal, or peritoneal cancers.  Pelvic exam and Pap test. This may be done every 3 years starting at age 21. Starting at age 29, this may be done every 5 years if you have a Pap test in combination with an HPV test. Discuss your test results, treatment options, and if necessary, the need for more tests with your health care provider. Vaccines  Your health care provider may recommend certain vaccines, such as:  Influenza vaccine. This is recommended every year.  Tetanus, diphtheria, and acellular pertussis (Tdap, Td) vaccine. You may need a Td booster every 10 years.  Varicella vaccine. You may need this if you have not been vaccinated.  HPV vaccine. If you are 28 or younger, you may need three doses over 6 months.  Measles, mumps, and rubella (MMR) vaccine. You may need at least one dose of MMR. You may also need a second dose.  Pneumococcal 13-valent conjugate (PCV13) vaccine. You may need this if you have certain conditions and were not previously vaccinated.  Pneumococcal polysaccharide (PPSV23) vaccine. You may need one or two doses if you smoke cigarettes or if you have certain conditions.  Meningococcal vaccine. One dose is recommended if you are age 69-21 years and a first-year college student living in a residence hall, or if you have one of several medical conditions. You may also need additional booster doses.  Hepatitis A vaccine. You may need this if you have certain conditions or if you travel or work in places where  you may be exposed to hepatitis A.  Hepatitis B vaccine. You may need this if you have certain conditions or if you travel or work in places where you may be exposed to hepatitis B.  Haemophilus influenzae type b (Hib) vaccine. You may need this if you have certain risk factors. Talk to your health care provider about which screenings and vaccines you need and how often you need them. This  information is not intended to replace advice given to you by your health care provider. Make sure you discuss any questions you have with your health care provider. Document Released: 05/29/2001 Document Revised: 12/21/2015 Document Reviewed: 02/01/2015 Elsevier Interactive Patient Education  2017 [employer].

## 2016-08-22 NOTE — Progress Notes (Signed)
[name] is a 31 y.o. female presents to office today for annual physical exam examination.  Concerns today include:  1. Vaginal irritation that started after last menstrual cycle.  She reports abnormal menstrual cycles previously but have been regular over the last couple of months. Patient's last menstrual period was 07/31/2016 (approximate).  Condoms for contraception.  Never been pregnant.   2. Snoring: additionally, we discussed previous concern for OSA.  She was never able to have sleep study done.  She does wish to proceed with study.  Continues to have coughing, snoring and witnessed sleep apneas.  Last eye exam: >1 year Last dental exam: >1 year Last pap smear: 01/2014, normal.  Never h/o abnormal.  Past Medical History:  Diagnosis Date  . Ankle ulcer ([place])    right  . Childhood asthma   . Chronic lower back pain   . GERD (gastroesophageal reflux disease)   . Headache    "only when I'm hungry" (10/14/2015)  . Hypertension   . Obesity   . Varicose veins   . VSD (ventricular septal defect)    "had hole in bottom of her heart; closed up on it's own; no OR" /mom 10/14/2015)   Social History   Social History  . Marital status: Single    Spouse name: N/A  . Number of children: N/A  . Years of education: N/A   Occupational History  . Not on file.   Social History Main Topics  . Smoking status: Never Smoker  . Smokeless tobacco: Never Used  . Alcohol use Yes     Comment: 08/2016, socially  . Drug use: Yes    Types: Marijuana     Comment: last 07/2016, trying to stop  . Sexual activity: Yes    Birth control/ protection: Condom   Other Topics Concern  . Not on file   Social History Narrative  . No narrative on file   Past Surgical History:  Procedure Laterality Date  . HERNIA REPAIR    . INSERTION OF MESH N/A 10/14/2015   Procedure: INSERTION OF MESH;  Surgeon: [name] Confer, MD;  Location: [place];  Service: General;  Laterality: N/A;  . LAPAROSCOPIC  INCISIONAL / UMBILICAL / [place]  10/14/2015   VHR w/mesh  . TONSILLECTOMY AND ADENOIDECTOMY  1990s  . TYMPANOSTOMY TUBE PLACEMENT Bilateral 1990s  . VEIN LIGATION AND STRIPPING Right 12/15/2014   Procedure: SAPHENOUS VEIN LIGATION AND STRIPPING; GREATER THAN 20 STAB PHLEBECTOMIES;  Surgeon: [name] Posner, MD;  Location: [place];  Service: Vascular;  Laterality: Right;  . VENTRAL HERNIA REPAIR N/A 10/14/2015   Procedure: LAPAROSCOPIC VENTRAL HERNIA;  Surgeon: [name] Confer, MD;  [facility]: [facility];  Service: General;  Laterality: N/A;   Family History  Problem Relation Age of Onset  . Diabetes Mother   . Varicose Veins Mother   . Hypertension Mother   . Hyperlipidemia Mother   . Varicose Veins Sister   . Cancer Sister     unsure of type but requiring hysterectomy    ROS: Review of Systems Constitutional: negative Eyes: negative Ears, nose, mouth, throat, and face: positive for snoring and rhinorrhea Respiratory: negative Cardiovascular: negative Gastrointestinal: positive for occ episodes of diarrhea after meals Genitourinary:positive for abnormal menstrual periods and vaginal irritation Integument/breast: negative Hematologic/lymphatic: positive for vericose veins Musculoskeletal:positive for back pain Neurological: negative Behavioral/Psych: negative Endocrine: negative Allergic/Immunologic: positive for rhinorrhea    Physical exam BP 110/70   Pulse 80   Temp 98.4 F (  36.9 C) (Oral)   Ht 5' 6"  (1.676 m)   Wt (!) 363 lb (164.7 kg)   LMP 07/31/2016 (Approximate)   SpO2 99%   BMI 58.59 kg/m  General appearance: alert, cooperative, appears stated age and morbidly obese Head: Normocephalic, without obvious abnormality, atraumatic Eyes: negative findings: lids and lashes normal, conjunctivae and sclerae normal, corneas clear and pupils equal, round, reactive to light and accomodation Ears: normal TM's and external ear canals both ears Nose: Nares normal. Septum  midline. Mucosa normal. No drainage or sinus tenderness. Throat: lips, mucosa, and tongue normal; teeth and gums normal Neck: no adenopathy, supple, symmetrical, trachea midline and thyroid not enlarged, symmetric, no tenderness/mass/nodules Back: symmetric, no curvature. ROM normal. No CVA tenderness. Lungs: clear to auscultation bilaterally Heart: regular rate and rhythm, S1, S2 normal, no murmur, click, rub or gallop Abdomen: soft, non-tender; bowel sounds normal; no masses,  no organomegaly and obese Pelvic: cervix normal in appearance, external genitalia normal, no adnexal masses or tenderness, no cervical motion tenderness, rectovaginal septum normal, uterus normal size, shape, and consistency and vagina normal without discharge Extremities: extremities normal, atraumatic, no cyanosis or edema Pulses: 2+ and symmetric Skin: several vericose veins along LE.  Areas of hyperpigementation along LE bilaterally Lymph nodes: Cervical, supraclavicular, and axillary nodes normal. Neurologic: Grossly normal   Results for orders placed or performed in visit on 08/22/16 (from the past 24 hour(s))  POCT Wet Prep [facility])     Status: Abnormal   Collection Time: 08/22/16  9:45 AM  Result Value Ref Range   Source Wet Prep POC VAG    WBC, Wet Prep HPF POC 0-3    Bacteria Wet Prep HPF POC Moderate (A) Few   Clue Cells Wet Prep HPF POC Moderate (A) None   Clue Cells Wet Prep Whiff POC Positive Whiff    Yeast Wet Prep HPF POC None    Trichomonas Wet Prep HPF POC Absent Absent   Assessment/ Plan: [name] here for annual physical exam.   Essential hypertension, benign Well controlled on HCTZ 12.37m.  Patient has not done sleep study yet.  Undiagnosed OSA may contribute to HTN.  Class 3 severe obesity due to excess calories without serious comorbidity with body mass index (BMI) of 50.0 to 59.9 in adult ([facility] - Lifestyle changes recommended and reinforced - CMP14+EGFR - TSH - Lipid  panel - Split night study; Future  Possible exposure to STD - HIV antibody - RPR - POCT Wet Prep ([facility]  Screening for cervical cancer - Cytology - PAP w/ HPV and GC/CT testing  Bacterial vaginosis - metroNIDAZOLE (FLAGYL) 500 MG tablet; Take 1 tablet (500 mg total) by mouth 2 (two) times daily.  Dispense: 14 tablet; Refill: 0  Irregular periods/menstrual cycles - CBC - TSH  Snoring - Split night study; Future   Shamarion Coots M. GLajuana Ripple DO PGY-3, [facility]

## 2016-08-23 LAB — CYTOLOGY - PAP
Chlamydia: NEGATIVE
Diagnosis: NEGATIVE
HPV: NOT DETECTED
Neisseria Gonorrhea: NEGATIVE
Trichomonas: NEGATIVE

## 2016-08-24 ENCOUNTER — Encounter: Payer: Self-pay | Admitting: Family Medicine

## 2016-08-24 LAB — LIPID PANEL
CHOLESTEROL TOTAL: 137 mg/dL (ref 100–199)
Chol/HDL Ratio: 2.5 ratio (ref 0.0–4.4)
HDL: 55 mg/dL (ref >39)
LDL Calculated: 70 mg/dL (ref 0–99)
TRIGLYCERIDES: 62 mg/dL (ref 0–149)
VLDL Cholesterol Cal: 12 mg/dL (ref 5–40)

## 2016-08-24 LAB — CMP14+EGFR
ALBUMIN: 4.3 g/dL (ref 3.5–5.5)
ALK PHOS: 49 IU/L (ref 39–117)
ALT: 16 IU/L (ref 0–32)
AST: 20 IU/L (ref 0–40)
Albumin/Globulin Ratio: 1.5 (ref 1.2–2.2)
BILIRUBIN TOTAL: 0.3 mg/dL (ref 0.0–1.2)
BUN / CREAT RATIO: 18 (ref 9–23)
BUN: 14 mg/dL (ref 6–20)
CHLORIDE: 105 mmol/L (ref 96–106)
CO2: 23 mmol/L (ref 18–29)
Calcium: 9.1 mg/dL (ref 8.7–10.2)
Creatinine, Ser: 0.8 mg/dL (ref 0.57–1.00)
GFR calc Af Amer: 114 mL/min/{1.73_m2} (ref >59)
GFR calc non Af Amer: 99 mL/min/{1.73_m2} (ref >59)
GLUCOSE: 74 mg/dL (ref 65–99)
Globulin, Total: 2.9 g/dL (ref 1.5–4.5)
POTASSIUM: 4.5 mmol/L (ref 3.5–5.2)
Sodium: 143 mmol/L (ref 134–144)
Total Protein: 7.2 g/dL (ref 6.0–8.5)

## 2016-08-24 LAB — CBC
Hematocrit: 38.1 % (ref 34.0–46.6)
Hemoglobin: 12.2 g/dL (ref 11.1–15.9)
MCH: 29.3 pg (ref 26.6–33.0)
MCHC: 32 g/dL (ref 31.5–35.7)
MCV: 91 fL (ref 79–97)
PLATELETS: 322 10*3/uL (ref 150–379)
RBC: 4.17 x10E6/uL (ref 3.77–5.28)
RDW: 13.3 % (ref 12.3–15.4)
WBC: 7.2 10*3/uL (ref 3.4–10.8)

## 2016-08-24 LAB — HIV ANTIBODY (ROUTINE TESTING W REFLEX): HIV Screen 4th Generation wRfx: NONREACTIVE

## 2016-08-24 LAB — TSH: TSH: 2.15 u[IU]/mL (ref 0.450–4.500)

## 2016-08-24 LAB — RPR: RPR Ser Ql: NONREACTIVE

## 2016-08-28 ENCOUNTER — Telehealth: Payer: Self-pay | Admitting: Family Medicine

## 2016-08-28 NOTE — Telephone Encounter (Signed)
Would like results from physical

## 2016-08-28 NOTE — Telephone Encounter (Signed)
Please let her know all results were normal, except the bacterial infection we discussed on the phone that I gave her antibiotics for.  Her pap looked good.  She won't have to repeat for 5 years.  She should be getting a copy in the mail soon.

## 2016-08-28 NOTE — Telephone Encounter (Signed)
Pt informed. Aja Bolander T Hoyte Ziebell, CMA  

## 2016-10-06 ENCOUNTER — Other Ambulatory Visit: Payer: Self-pay | Admitting: Family Medicine

## 2016-10-06 DIAGNOSIS — I10 Essential (primary) hypertension: Secondary | ICD-10-CM

## 2016-12-12 ENCOUNTER — Encounter: Payer: Self-pay | Admitting: Family Medicine

## 2016-12-12 ENCOUNTER — Ambulatory Visit (INDEPENDENT_AMBULATORY_CARE_PROVIDER_SITE_OTHER): Payer: BLUE CROSS/BLUE SHIELD | Admitting: Family Medicine

## 2016-12-12 VITALS — BP 94/64 | HR 72 | Temp 98.7°F | Ht 66.0 in | Wt 369.2 lb

## 2016-12-12 DIAGNOSIS — I50812 Chronic right heart failure: Secondary | ICD-10-CM

## 2016-12-12 DIAGNOSIS — R600 Localized edema: Secondary | ICD-10-CM | POA: Insufficient documentation

## 2016-12-12 DIAGNOSIS — R0601 Orthopnea: Secondary | ICD-10-CM | POA: Diagnosis not present

## 2016-12-12 DIAGNOSIS — R0683 Snoring: Secondary | ICD-10-CM | POA: Diagnosis not present

## 2016-12-12 DIAGNOSIS — R29818 Other symptoms and signs involving the nervous system: Secondary | ICD-10-CM | POA: Insufficient documentation

## 2016-12-12 DIAGNOSIS — M722 Plantar fascial fibromatosis: Secondary | ICD-10-CM | POA: Diagnosis not present

## 2016-12-12 NOTE — Assessment & Plan Note (Signed)
-

## 2016-12-12 NOTE — Assessment & Plan Note (Signed)
referall to Dr. Leona Singleton at [facility] for orthotics and nigth time plantar fascitis splint

## 2016-12-12 NOTE — Progress Notes (Signed)
    Subjective:  [name] is a 31 y.o. female [facility] with a chief complaint of foot pain, leg swelling and snoring at night.   Her foot pain has been slow onset for last 2 weeks, she walks a lot at work and her shoes have uneven tread wear.   Hurts worst in the morning. She has had increased leg swelling over the last year and it's worse at the end of the day.   She says she has trouble breathing when laying flat and she snores quite a bit and it occasionally wakes her up at night.   Objective:  Physical Exam: BP 94/64 (BP Location: Right Wrist)   Pulse 72   Temp 98.7 F (37.1 C)   Ht 5\' 6"  (1.676 m)   Wt (!) 369 lb 3.2 oz (167.5 kg)   SpO2 98%   BMI 59.59 kg/m   Gen: NAD, conversational, morbidly obese CV: RRR with no murmurs appreciated Pulm: NWOB, CTAB with no crackles, wheezes, or rhonchi GI: Normal bowel sounds present. Soft, Nontender, Nondistended. MSK: +1 pitting edema, pes planus, gait seems slightly to medial side of foot Skin: warm, dry Neuro: grossly normal, moves all extremities Psych: Normal affect and thought content  BNP pending  No results found for this or any previous visit (from the past 72 hour(s)).   Assessment/Plan:  Leg edema Ordered BNP  Orthopnea Ordered BNP  Snoring Polysomnography ordered  Plantar fasciitis referall to Dr. Leona SingletonLake at sports med for orthotics and nigth time plantar fascitis splint   Marthenia RollingScott Kyann Heydt, DO FAMILY MEDICINE RESIDENT - PGY1 12/12/2016 5:01 PM

## 2016-12-12 NOTE — Patient Instructions (Signed)
It was a pleasure to see you today! Thank you for choosing [facility] for your primary care. [name] was seen for foot pain, snoring and leg swelling.   You have been referred to the [facility] med clinic for your foot related medical supplies, to the sleep study center for your snoring and we've asked for a BNP to check your heart function due to your new leg swelling.   Come back to the clinic if you need anything, and go to the emergency room if have any life threatening concerns.   Best,  Dr. Marthenia RollingScott Jerrion Tabbert FAMILY MEDICINE RESIDENT - PGY1 12/12/2016 4:39 PM

## 2016-12-12 NOTE — Assessment & Plan Note (Signed)
Ordered BNP.

## 2016-12-12 NOTE — Assessment & Plan Note (Signed)
>>  ASSESSMENT AND PLAN FOR ORTHOPNEA WRITTEN ON 12/12/2016  4:59 PM BY BLAND, SCOTT, DO  Ordered BNP

## 2016-12-13 ENCOUNTER — Encounter: Payer: Self-pay | Admitting: Family Medicine

## 2016-12-13 LAB — BRAIN NATRIURETIC PEPTIDE: BNP: 52.7 pg/mL (ref 0.0–100.0)

## 2016-12-24 ENCOUNTER — Ambulatory Visit (INDEPENDENT_AMBULATORY_CARE_PROVIDER_SITE_OTHER): Payer: BLUE CROSS/BLUE SHIELD | Admitting: Sports Medicine

## 2016-12-24 VITALS — BP 106/80 | Ht 66.0 in | Wt 368.0 lb

## 2016-12-24 DIAGNOSIS — M722 Plantar fascial fibromatosis: Secondary | ICD-10-CM

## 2016-12-24 NOTE — Patient Instructions (Addendum)
It was great seeing you today for your office visit.  I am recommending several therapies for treatment of your L foot including:  - Stretching exercises based upon the diagram a gave you, perform these prior to getting up out of bed to stretch the plantar fascia  - Soak your heel in an ice bath for approximately 10 minutes to help with the ice component  - wear shoes with good arch support, avoid walking barefoot as this may aggravate your symptoms  - You may purchase a [place] sock to wear at night for further assistance in your symptoms. Plantar Fasciitis Rehab Ask your health care provider which exercises are safe for you. Do exercises exactly as told by your health care provider and adjust them as directed. It is normal to feel mild stretching, pulling, tightness, or discomfort as you do these exercises, but you should stop right away if you feel sudden pain or your pain gets worse. Do not begin these exercises until told by your health care provider. Stretching and range of motion exercises These exercises warm up your muscles and joints and improve the movement and flexibility of your foot. These exercises also help to relieve pain. Exercise A: Plantar fascia stretch  1. Sit with your left / right leg crossed over your opposite knee. 2. Hold your heel with one hand with that thumb near your arch. With your other hand, hold your toes and gently pull them back toward the top of your foot. You should feel a stretch on the bottom of your toes or your foot or both. 3. Hold this stretch for__________ seconds. 4. Slowly release your toes and return to the starting position. Repeat __________ times. Complete this exercise __________ times a day. Exercise B: Gastroc, standing  1. Stand with your hands against a wall. 2. Extend your left / right leg behind you, and bend your front knee slightly. 3. Keeping your heels on the floor and keeping your back knee straight, shift your weight toward  the wall without arching your back. You should feel a gentle stretch in your left / right calf. 4. Hold this position for __________ seconds. Repeat __________ times. Complete this exercise __________ times a day. Exercise C: Soleus, standing 1. Stand with your hands against a wall. 2. Extend your left / right leg behind you, and bend your front knee slightly. 3. Keeping your heels on the floor, bend your back knee and slightly shift your weight over the back leg. You should feel a gentle stretch deep in your calf. 4. Hold this position for __________ seconds. Repeat __________ times. Complete this exercise __________ times a day. Exercise D: Gastrocsoleus, standing 1. Stand with the ball of your left / right foot on a step. The ball of your foot is on the walking surface, right under your toes. 2. Keep your other foot firmly on the same step. 3. Hold onto the wall or a railing for balance. 4. Slowly lift your other foot, allowing your body weight to press your heel down over the edge of the step. You should feel a stretch in your left / right calf. 5. Hold this position for __________ seconds. 6. Return both feet to the step. 7. Repeat this exercise with a slight bend in your left / right knee. Repeat __________ times with your left / right knee straight and __________ times with your left / right knee bent. Complete this exercise __________ times a day. Balance exercise This exercise builds your balance and  strength control of your arch to help take pressure off your plantar fascia. Exercise E: Single leg stand 1. Without shoes, stand near a railing or in a doorway. You may hold onto the railing or door frame as needed. 2. Stand on your left / right foot. Keep your big toe down on the floor and try to keep your arch lifted. Do not let your foot roll inward. 3. Hold this position for __________ seconds. 4. If this exercise is too easy, you can try it with your eyes closed or while standing  on a pillow. Repeat __________ times. Complete this exercise __________ times a day. This information is not intended to replace advice given to you by your health care provider. Make sure you discuss any questions you have with your health care provider. Document Released: 04/02/2005 Document Revised: 12/06/2015 Document Reviewed: 02/14/2015 Elsevier Interactive Patient Education  2018 [employer]

## 2016-12-24 NOTE — Progress Notes (Signed)
   Subjective:    Patient ID: [name] SimmondsShama J [name], female    DOB: 04/28/1985, 31 y.o.   MRN: [id number]  31yo F who presents with a 3 week history of L medial heel pain.   Sx began all of a sudden in the morning when trying to get up out of bed. They have progressed to the point where she has to limp because the plantar aspect of her foot is aching. Pain is worsen first thing in the morning and at the end of the day. Has tried acetaminophen and diclofenac without improvement. No associated numbness or tingling. No inciting event or trauma. Denies calf tenderness. No right sided foot pain. No knee pain.   Currently, she is being worked up by her PCP for b/l LE edema with recent blood work pending. She also notes she has gained a significant amount of weight over the past few years. Her thyroid has been screened and is normal as well as fasting glucose levels.      Review of Systems  Musculoskeletal: Positive for arthralgias (R knee pain) and gait problem. Negative for back pain and joint swelling.       + L heel pain  Skin: Negative for rash and wound.  Neurological: Negative for weakness and numbness.       Objective:   Physical Exam  Constitutional: She is oriented to person, place, and time. No distress.  Obese  Musculoskeletal: She exhibits edema (+ 1 b/l LE pitting edema) and deformity (Pes planus of L and R foot).  Pes planus noted b/l upon inspection with prominent veins and visualized edema of feet b/l. No restriction in passive or active dorsiflexion/plantar flexion b/l. Tenderness to palpation over medial calcaneus on L. EHL/FHL intact. +5/5 ankle dorsiflexion and +5/5 ankle plantar flexion bilaterally. Negative calcaneal squeeze test. Negative anterior drawer. No pain with ankle inversion/eversion testing. No tenderness at the base of the fifth metatarsal or in distal forefoot plantar region. No pain on metatarsal/phalangeal manipulation. Sensation intact to light touch L4-S1. +1  pitting edema bilaterally. Upon ambulation, collapse of medial arch bilaterally. Wearing flat slip on shoes upon presentation. Work shoes brought in for examination.  Neurological: She is alert and oriented to person, place, and time.  Skin: Skin is warm. No rash noted.  Callus formation over medial calcaneus  Nursing note and vitals reviewed.         Assessment & Plan:  L Plantar Fasciitis   Recommend treatment with ice therapy as discussed in discharge handout at least 10min daily. Also, patient was fitted and given orthotics with extra medial sided scaphoid support which were placed in her work shoes. She was counseled on the importance of wearing supportive shoes and avoiding ambulation with bare feet. She was also given information on purchasing a [place] sock to be worn at night for further assistance. She was counseled on plantar fasciitis stretches and given an educational handout with this. If not improving in 6 weeks, recommend further therapy with possibility of imaging like diagnostic ultrasound. Patient agreeable to this plan.

## 2017-01-21 ENCOUNTER — Telehealth: Payer: Self-pay | Admitting: Podiatry

## 2017-01-21 ENCOUNTER — Ambulatory Visit (INDEPENDENT_AMBULATORY_CARE_PROVIDER_SITE_OTHER): Payer: BLUE CROSS/BLUE SHIELD | Admitting: Sports Medicine

## 2017-01-21 ENCOUNTER — Ambulatory Visit (INDEPENDENT_AMBULATORY_CARE_PROVIDER_SITE_OTHER): Payer: BLUE CROSS/BLUE SHIELD

## 2017-01-21 ENCOUNTER — Ambulatory Visit (INDEPENDENT_AMBULATORY_CARE_PROVIDER_SITE_OTHER): Payer: BLUE CROSS/BLUE SHIELD | Admitting: Podiatry

## 2017-01-21 VITALS — BP 97/66 | HR 66 | Ht 66.0 in | Wt 368.0 lb

## 2017-01-21 VITALS — BP 111/78 | Ht 66.0 in | Wt 368.0 lb

## 2017-01-21 DIAGNOSIS — M722 Plantar fascial fibromatosis: Secondary | ICD-10-CM

## 2017-01-21 MED ORDER — BETAMETHASONE SOD PHOS & ACET 6 (3-3) MG/ML IJ SUSP: 3.0000 mg | Freq: Once | INTRAMUSCULAR | Status: DC

## 2017-01-21 MED ORDER — DICLOFENAC SODIUM 75 MG PO TBEC: 75.0000 mg | DELAYED_RELEASE_TABLET | Freq: Two times a day (BID) | ORAL | 0 refills | Status: DC

## 2017-01-21 NOTE — Telephone Encounter (Signed)
I told pt Dr. Logan Bores felt the shot and the antiinflammatory was going to help. Pt states she will take the Diclofenac then, and asked if she could have an alcoholic drink after the shot, because today is her birthday. I told her not to take the diclofenac and drink, but the alcoholic drink would not affect the shot, but not to drink and drive. Pt states she will only have a little drink and stated understanding.

## 2017-01-21 NOTE — Telephone Encounter (Signed)
I just saw Dr. Logan Bores and he prescribed diclofenac for me to take to help with the pain. I was already on it and taking it for other reasons and it wasn't helping with my foot pain and I don't know if it will help with the injection that I got. But if you could, call me back at [phone], please.

## 2017-01-21 NOTE — Progress Notes (Signed)
   Subjective:    Patient ID: [name], female    DOB: Jun 18, 1985, 31 y.o.   MRN: [id number]  HPI   Patient comes in today for follow-up on left foot plantar fasciitis. She still struggling with pain. She did not like the scaphoid pads. She has been doing her exercises. Pain present with first step as well as at the end of the day.   Review of Systems    as above Objective:   Physical Exam  Obese. No acute distress  Left foot: Tenderness to palpation at the calcaneal origin of the plantar fascial. No soft tissue swelling. Negative calcaneal squeeze. Neurovascular intact distally.      Assessment & Plan:   Left foot pain secondary to plantar fasciitis  I'll refer the patient to Dr. [name] Letters for further workup and treatment. Follow-up with me as needed.

## 2017-01-21 NOTE — Progress Notes (Signed)
   Subjective:    Patient ID: [name], female    DOB: 1986-04-14, 31 y.o.   MRN: [id number]  HPI    Review of Systems  All other systems reviewed and are negative.      Objective:   Physical Exam        Assessment & Plan:

## 2017-01-22 NOTE — Progress Notes (Signed)
   Subjective: Patient presents today for pain and tenderness in the left heel and arch that is been ongoing for the past 4 months. She states that it hurts in the mornings with the first steps out of bed. She has used ice therapy, exercise and wearing an ankle sleeve for treatment of the pain. Patient presents today for further treatment and evaluation.   Past Medical History:  Diagnosis Date  . Ankle ulcer ([facility])    right  . Childhood asthma   . Chronic lower back pain   . GERD (gastroesophageal reflux disease)   . Headache    "only when I'm hungry" (10/14/2015)  . Hypertension   . Obesity   . Varicose veins   . VSD (ventricular septal defect)    "had hole in bottom of her heart; closed up on it's own; no OR" /mom 10/14/2015)     Objective: Physical Exam General: The patient is alert and oriented x3 in no acute distress.  Dermatology: Skin is warm, dry and supple bilateral lower extremities. Negative for open lesions or macerations bilateral.   Vascular: Dorsalis Pedis and Posterior Tibial pulses palpable bilateral.  Capillary fill time is immediate to all digits.  Neurological: Epicritic and protective threshold intact bilateral.   Musculoskeletal: Tenderness to palpation at the medial calcaneal tubercale and through the insertion of the plantar fascia of the left foot. All other joints range of motion within normal limits bilateral. Strength 5/5 in all groups bilateral.   Radiographic exam:   Normal osseous mineralization. Joint spaces preserved. No fracture/dislocation/boney destruction. Calcaneal spur present with mild thickening of plantar fascia left. No other soft tissue abnormalities or radiopaque foreign bodies.   Assessment: 1. Plantar fasciitis left foot  Plan of Care:  1. Patient evaluated. Xrays reviewed.   2. Injection of 0.5cc Celestone soluspan injected into the left plantar fascia.  3. Rx for Diclofenac  PO BID ordered for patient. 4. Instructed  patient regarding therapies and modalities at home to alleviate symptoms.  5. Return to clinic in 4 weeks.    Patient works at [employer].   Felecia Shelling, DPM [facility]  Dr. Felecia Shelling, DPM    [address] [place], [place] [address]                Office [phone]  Fax [phone]

## 2017-02-13 ENCOUNTER — Encounter (HOSPITAL_BASED_OUTPATIENT_CLINIC_OR_DEPARTMENT_OTHER): Payer: BLUE CROSS/BLUE SHIELD

## 2017-02-18 ENCOUNTER — Ambulatory Visit (INDEPENDENT_AMBULATORY_CARE_PROVIDER_SITE_OTHER): Payer: BLUE CROSS/BLUE SHIELD | Admitting: Podiatry

## 2017-02-18 ENCOUNTER — Ambulatory Visit (INDEPENDENT_AMBULATORY_CARE_PROVIDER_SITE_OTHER): Payer: BLUE CROSS/BLUE SHIELD | Admitting: Orthotics

## 2017-02-18 DIAGNOSIS — M722 Plantar fascial fibromatosis: Secondary | ICD-10-CM

## 2017-02-18 NOTE — Progress Notes (Signed)

## 2017-02-21 NOTE — Progress Notes (Signed)
   Subjective: Patient presents today for follow up evaluation of left plantar fasciitis. She reports significant improvement of the pain after receiving the injections. She denies any new complaints at this time. Patient presents today for further treatment and evaluation.   Past Medical History:  Diagnosis Date  . Ankle ulcer ([facility])    right  . Childhood asthma   . Chronic lower back pain   . GERD (gastroesophageal reflux disease)   . Headache    "only when I'm hungry" (10/14/2015)  . Hypertension   . Obesity   . Varicose veins   . VSD (ventricular septal defect)    "had hole in bottom of her heart; closed up on it's own; no OR" /mom 10/14/2015)     Objective: Physical Exam General: The patient is alert and oriented x3 in no acute distress.  Dermatology: Skin is warm, dry and supple bilateral lower extremities. Negative for open lesions or macerations bilateral.   Vascular: Dorsalis Pedis and Posterior Tibial pulses palpable bilateral.  Capillary fill time is immediate to all digits.  Neurological: Epicritic and protective threshold intact bilateral.   Musculoskeletal: Tenderness to palpation at the medial calcaneal tubercale and through the insertion of the plantar fascia of the left foot. All other joints range of motion within normal limits bilateral. Strength 5/5 in all groups bilateral.    Assessment: 1. Plantar fasciitis left foot  Plan of Care:  1. Patient evaluated.  2. Injection of 0.5cc Celestone soluspan injected into the left plantar fascia.  3. Continue taking Diclofenac as directed. 4. Patient to [facility] up orthotics in 3 weeks [facility] [place].  5. Return to clinic in 6 weeks.    Patient works at [employer].   Felecia ShellingBrent M. Samaria Anes, DPM [facility]  Dr. Felecia ShellingBrent M. Nayan Proch, DPM    [address]                                     [place], [place] [id number]                Office [phone]  Fax [phone]

## 2017-03-11 ENCOUNTER — Encounter: Payer: BLUE CROSS/BLUE SHIELD | Admitting: Orthotics

## 2017-03-26 ENCOUNTER — Other Ambulatory Visit: Payer: Self-pay | Admitting: *Deleted

## 2017-03-26 ENCOUNTER — Ambulatory Visit (HOSPITAL_BASED_OUTPATIENT_CLINIC_OR_DEPARTMENT_OTHER): Payer: BLUE CROSS/BLUE SHIELD | Attending: Family Medicine

## 2017-03-27 MED ORDER — DICLOFENAC SODIUM 75 MG PO TBEC: 75.0000 mg | DELAYED_RELEASE_TABLET | Freq: Two times a day (BID) | ORAL | 0 refills | Status: DC

## 2017-04-01 ENCOUNTER — Ambulatory Visit: Payer: BLUE CROSS/BLUE SHIELD | Admitting: Orthotics

## 2017-04-01 ENCOUNTER — Ambulatory Visit (INDEPENDENT_AMBULATORY_CARE_PROVIDER_SITE_OTHER): Payer: BLUE CROSS/BLUE SHIELD | Admitting: Podiatry

## 2017-04-01 ENCOUNTER — Encounter: Payer: Self-pay | Admitting: Podiatry

## 2017-04-01 DIAGNOSIS — M722 Plantar fascial fibromatosis: Secondary | ICD-10-CM

## 2017-04-01 MED ORDER — DICLOFENAC SODIUM 75 MG PO TBEC: 75.0000 mg | DELAYED_RELEASE_TABLET | Freq: Two times a day (BID) | ORAL | 1 refills | Status: DC

## 2017-04-01 NOTE — Progress Notes (Signed)
Patient came in today to pick up f/o; however, she did not bring appropriate footwear.  She was advised if she had any issues to come back and see us.

## 2017-04-03 NOTE — Progress Notes (Signed)
   Subjective: Patient presents today for follow up evaluation of left plantar fasciitis. She states her pain has now worsened and rates it at 9/10. She states the injection helped alleviate the pain for about three weeks before returning. Patient presents today for further treatment and evaluation.   Past Medical History:  Diagnosis Date  . Ankle ulcer (HCC)    right  . Childhood asthma   . Chronic lower back pain   . GERD (gastroesophageal reflux disease)   . Headache    "only when I'm hungry" (10/14/2015)  . Hypertension   . Obesity   . Varicose veins   . VSD (ventricular septal defect)    "had hole in bottom of her heart; closed up on it's own; no OR" /mom 10/14/2015)     Objective: Physical Exam General: The patient is alert and oriented x3 in no acute distress.  Dermatology: Skin is warm, dry and supple bilateral lower extremities. Negative for open lesions or macerations bilateral.   Vascular: Dorsalis Pedis and Posterior Tibial pulses palpable bilateral.  Capillary fill time is immediate to all digits.  Neurological: Epicritic and protective threshold intact bilateral.   Musculoskeletal: Tenderness to palpation at the medial calcaneal tubercale and through the insertion of the plantar fascia of the left foot. All other joints range of motion within normal limits bilateral. Strength 5/5 in all groups bilateral.    Assessment: 1. Plantar fasciitis left foot  Plan of Care:  1. Patient evaluated.  2. Injection of 0.5cc Celestone soluspan injected into the left plantar fascia.  3. Begin wearing custom molded orthotics with good shoe gear.  4. Refill prescription for Diclofenac provided to patient.  5. Return to clinic in 4 weeks.     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   Felecia ShellingBrent M. Evans, DPM [facility]  Dr. Felecia ShellingBrent M. Evans, DPM    [address]                                     [place], [place] [address]                Office [phone]  [phone]  Fax [phone]

## 2017-05-13 ENCOUNTER — Encounter: Payer: BLUE CROSS/BLUE SHIELD | Admitting: Podiatry

## 2017-05-20 NOTE — Progress Notes (Signed)
This encounter was created in error - please disregard.

## 2017-06-03 ENCOUNTER — Encounter: Payer: BLUE CROSS/BLUE SHIELD | Admitting: Podiatry

## 2017-06-06 ENCOUNTER — Encounter: Payer: Self-pay | Admitting: Internal Medicine

## 2017-06-06 ENCOUNTER — Ambulatory Visit (INDEPENDENT_AMBULATORY_CARE_PROVIDER_SITE_OTHER): Payer: BLUE CROSS/BLUE SHIELD | Admitting: Internal Medicine

## 2017-06-06 DIAGNOSIS — R42 Dizziness and giddiness: Secondary | ICD-10-CM | POA: Diagnosis not present

## 2017-06-06 MED ORDER — DICLOFENAC SODIUM 75 MG PO TBEC: 75.0000 mg | DELAYED_RELEASE_TABLET | Freq: Two times a day (BID) | ORAL | 0 refills | Status: DC

## 2017-06-06 MED ORDER — MECLIZINE HCL 32 MG PO TABS: 32.0000 mg | ORAL_TABLET | Freq: Three times a day (TID) | ORAL | 0 refills | Status: DC | PRN

## 2017-06-06 NOTE — Patient Instructions (Addendum)
It was nice meeting you today [name]!  For dizziness, you can take one meclizine tablet up to every 8 hours as needed. It is important not to drive while you feel dizzy. If you become very nauseated and are throwing up, please call to let us know, and I can send in a medication for you.   If your symptoms severely worsen, or if you start to have numbness or weakness associated with your dizziness, please call to let us know or go to the emergency room.   If you have any questions or concerns, please feel free to call the clinic.   Be well,  Dr. Natale MilchLancaster   Vertigo Vertigo means that you feel like you are moving when you are not. Vertigo can also make you feel like things around you are moving when they are not. This feeling can come and go at any time. Vertigo often goes away on its own. Follow these instructions at home:  Avoid making fast movements.  Avoid driving.  Avoid using heavy machinery.  Avoid doing any task or activity that might cause danger to you or other people if you would have a vertigo attack while you are doing it.  Sit down right away if you feel dizzy or have trouble with your balance.  Take over-the-counter and prescription medicines only as told by your doctor.  Follow instructions from your doctor about which positions or movements you should avoid.  Drink enough fluid to keep your pee (urine) clear or pale yellow.  Keep all follow-up visits as told by your doctor. This is important. Contact a doctor if:  Medicine does not help your vertigo.  You have a fever.  Your problems get worse or you have new symptoms.  Your family or friends see changes in your behavior.  You feel sick to your stomach (nauseous) or you throw up (vomit).  You have a "pins and needles" feeling or you are numb in part of your body. Get help right away if:  You have trouble moving or talking.  You are always dizzy.  You pass out (faint).  You get very bad  headaches.  You feel weak or have trouble using your hands, arms, or legs.  You have changes in your hearing.  You have changes in your seeing (vision).  You get a stiff neck.  Bright light starts to bother you. This information is not intended to replace advice given to you by your health care provider. Make sure you discuss any questions you have with your health care provider. Document Released: 01/10/2008 Document Revised: 09/08/2015 Document Reviewed: 07/26/2014 Elsevier Interactive Patient Education  [employer]2018 Elsevier Inc.

## 2017-06-06 NOTE — Assessment & Plan Note (Signed)
Improving. Given onset with change in positioning with recurrence when again changing position, likely BPPV. Vestibular neuritis less likely as no recent illness, however patient does have sick contacts and viral illness may not have manifested yet. Would expect more severe nausea however. Less likely labyrinthitis or Meniere's given no hearing changes. Less likely central process as patient with no changes on neuro exam. Will treat conservatively for now with meclizine. Patient's nausea sounds well-controlled but she will call if vomiting resumes and will be happy to call in Zofran ODT if necessary. Work note given as would prefer for patient not to drive while symptoms present. Discussed strict return precautions.

## 2017-06-06 NOTE — Progress Notes (Signed)
   Subjective:   Patient: [name]       Birthdate: 02/04/1986       MRN: [id number]      HPI  [name] is a 32 y.o. female presenting for same day appt for dizziness.   Dizziness Began yesterday. Significantly improved today. Describes feeling as if she is going to fall over. Started yesterday when she lifted her head off the pillow first thing in the morning. Improved when she laid back down, then recurred when she had to get up to go to work. Persisted throughout the day yesterday but she was able to stay at work. Had some nausea yesterday but no vomiting. Symptoms are much milder today. Has not had any nausea today. Endorses some blurry vision when symptoms were most severe yesterday but this quickly resolved and has not occurred since. Denies hearing changes, including decreased hearing or tinnitus. No recent illnesses, however has multiple coworkers who are currently sick. Has not taken any meds to help symptoms. This has never happened before. Denies numbness, weakness, tingling. Patient sexually active and not on birth control however menstrual period ended a couple days ago.   Smoking status reviewed. Patient is never smoker.   Review of Systems See HPI.     Objective:  Physical Exam  Constitutional: She is oriented to person, place, and time and well-developed, well-nourished, and in no distress.  HENT:  Head: Normocephalic and atraumatic.  Eyes: Conjunctivae and EOM are normal. Pupils are equal, round, and reactive to light. Right eye exhibits no discharge. Left eye exhibits no discharge.  No nystagmus  Pulmonary/Chest: Effort normal. No respiratory distress.  Neurological: She is alert and oriented to person, place, and time.  CN II-XII intact. 5/5 strength upper and lower extremities bilaterally. Normal gait and coordination.   Psychiatric: Affect and judgment normal.      Assessment & Plan:  Dizziness Improving. Given onset with change in positioning with  recurrence when again changing position, likely BPPV. Vestibular neuritis less likely as no recent illness, however patient does have sick contacts and viral illness may not have manifested yet. Would expect more severe nausea however. Less likely labyrinthitis or Meniere's given no hearing changes. Less likely central process as patient with no changes on neuro exam. Will treat conservatively for now with meclizine. Patient's nausea sounds well-controlled but she will call if vomiting resumes and will be happy to call in Zofran ODT if necessary. Work note given as would prefer for patient not to drive while symptoms present. Discussed strict return precautions.    Tarri AbernethyAbigail J Avigail Pilling, MD, MPH PGY-3 [facility] Pager [phone]

## 2017-06-08 NOTE — Progress Notes (Signed)
This encounter was created in error - please disregard.

## 2017-06-11 ENCOUNTER — Telehealth: Payer: Self-pay | Admitting: *Deleted

## 2017-06-11 NOTE — Telephone Encounter (Signed)
Entered in error

## 2017-06-12 ENCOUNTER — Other Ambulatory Visit: Payer: Self-pay | Admitting: *Deleted

## 2017-06-13 ENCOUNTER — Other Ambulatory Visit: Payer: Self-pay

## 2017-06-13 MED ORDER — MECLIZINE HCL 32 MG PO TABS: 32.0000 mg | ORAL_TABLET | Freq: Three times a day (TID) | ORAL | 0 refills | Status: DC | PRN

## 2017-06-13 MED ORDER — MECLIZINE HCL 25 MG PO TABS: 25.0000 mg | ORAL_TABLET | Freq: Three times a day (TID) | ORAL | 0 refills | Status: DC | PRN

## 2017-06-13 NOTE — Telephone Encounter (Signed)
[facility] [address] Marti SleighMeclizine 32 MG tabs were ordered for this pt today.  [facility] comment: We can not obtain this MG. Please change to 12.5 or 25 MG.   Please advise. Sunday SpillersSharon T Kiran Lapine, CMA

## 2017-07-24 ENCOUNTER — Ambulatory Visit (INDEPENDENT_AMBULATORY_CARE_PROVIDER_SITE_OTHER): Payer: BLUE CROSS/BLUE SHIELD | Admitting: Podiatry

## 2017-07-24 DIAGNOSIS — M722 Plantar fascial fibromatosis: Secondary | ICD-10-CM | POA: Diagnosis not present

## 2017-07-24 MED ORDER — DICLOFENAC SODIUM 75 MG PO TBEC: 75.0000 mg | DELAYED_RELEASE_TABLET | Freq: Two times a day (BID) | ORAL | 1 refills | Status: DC

## 2017-07-29 NOTE — Progress Notes (Signed)
   Subjective: 32 year old female presenting today for follow up evaluation of left plantar fasciitis. She states the pain has improved but is still present intermittently. She has been taking Diclofenac as directed and needs a refill. Patient is here for further evaluation and treatment.   Past Medical History:  Diagnosis Date  . Ankle ulcer (HCC)    right  . Childhood asthma   . Chronic lower back pain   . GERD (gastroesophageal reflux disease)   . Headache    "only when I'm hungry" (10/14/2015)  . Hypertension   . Obesity   . Varicose veins   . VSD (ventricular septal defect)    "had hole in bottom of her heart; closed up on it's own; no OR" /mom 10/14/2015)     Objective: Physical Exam General: The patient is alert and oriented x3 in no acute distress.  Dermatology: Skin is warm, dry and supple bilateral lower extremities. Negative for open lesions or macerations bilateral.   Vascular: Dorsalis Pedis and Posterior Tibial pulses palpable bilateral.  Capillary fill time is immediate to all digits.  Neurological: Epicritic and protective threshold intact bilateral.   Musculoskeletal: Tenderness to palpation to the plantar aspect of the left heel along the plantar fascia. All other joints range of motion within normal limits bilateral. Strength 5/5 in all groups bilateral.    Assessment: 1. Plantar fasciitis left foot  Plan of Care:  1. Patient evaluated.  2. Patient declines injections today.  3. Refill prescription for Diclofenac provided to patient.  4. Plantar fascial brace dispensed.  5. Return to clinic in 6 weeks.   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  Felecia ShellingBrent M. Rachael Zapanta, DPM [facility]  Dr. Felecia ShellingBrent M. Natania Finigan, DPM    [address]                                     [place], [place] [address]                Office [phone]  Fax [phone]

## 2017-09-04 ENCOUNTER — Ambulatory Visit (INDEPENDENT_AMBULATORY_CARE_PROVIDER_SITE_OTHER): Payer: BLUE CROSS/BLUE SHIELD | Admitting: Podiatry

## 2017-09-04 ENCOUNTER — Encounter: Payer: Self-pay | Admitting: Podiatry

## 2017-09-04 DIAGNOSIS — M722 Plantar fascial fibromatosis: Secondary | ICD-10-CM | POA: Diagnosis not present

## 2017-09-09 NOTE — Progress Notes (Signed)
   Subjective: 32 year old female presenting today for follow up evaluation of left plantar fasciitis. She reports the pain returned in the left heel about one week ago. She describes it as sore and tender. Bearing weight and walking increases the pain. She has been taking Diclofenac for treatment. Patient is here for further evaluation and treatment.   Past Medical History:  Diagnosis Date  . Ankle ulcer (HCC)    right  . Childhood asthma   . Chronic lower back pain   . GERD (gastroesophageal reflux disease)   . Headache    "only when I'm hungry" (10/14/2015)  . Hypertension   . Obesity   . Varicose veins   . VSD (ventricular septal defect)    "had hole in bottom of her heart; closed up on it's own; no OR" /mom 10/14/2015)     Objective: Physical Exam General: The patient is alert and oriented x3 in no acute distress.  Dermatology: Skin is warm, dry and supple bilateral lower extremities. Negative for open lesions or macerations bilateral.   Vascular: Dorsalis Pedis and Posterior Tibial pulses palpable bilateral.  Capillary fill time is immediate to all digits.  Neurological: Epicritic and protective threshold intact bilateral.   Musculoskeletal: Tenderness to palpation to the plantar aspect of the left heel along the plantar fascia. All other joints range of motion within normal limits bilateral. Strength 5/5 in all groups bilateral.    Assessment: 1. Plantar fasciitis left foot  Plan of Care:  1. Patient evaluated.  2. Patient declines injections today.  3. Continue taking Diclofenac twice daily.  4. Appointment with Raiford Noble for custom molded orthotic modifications.  5. Return to clinic in 8 weeks. If no improvement, we will discuss surgery.    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  Felecia Shelling, DPM [facility]  Dr. Felecia Shelling, DPM    [address] [place], [place] [address]                Office  [phone]  Fax [phone]

## 2017-09-10 ENCOUNTER — Other Ambulatory Visit: Payer: BLUE CROSS/BLUE SHIELD | Admitting: Orthotics

## 2017-10-03 ENCOUNTER — Other Ambulatory Visit: Payer: BLUE CROSS/BLUE SHIELD | Admitting: Orthotics

## 2017-11-06 ENCOUNTER — Ambulatory Visit (INDEPENDENT_AMBULATORY_CARE_PROVIDER_SITE_OTHER): Payer: BLUE CROSS/BLUE SHIELD | Admitting: Podiatry

## 2017-11-06 DIAGNOSIS — M722 Plantar fascial fibromatosis: Secondary | ICD-10-CM | POA: Diagnosis not present

## 2017-11-06 MED ORDER — DICLOFENAC SODIUM 75 MG PO TBEC: 75.0000 mg | DELAYED_RELEASE_TABLET | Freq: Two times a day (BID) | ORAL | 1 refills | Status: DC

## 2017-11-06 MED ORDER — METHYLPREDNISOLONE 4 MG PO TBPK: ORAL_TABLET | ORAL | 0 refills | Status: DC

## 2017-11-09 NOTE — Progress Notes (Signed)
   Subjective: 32 year old female presenting today for follow up evaluation of left plantar fasciitis. She states the pain has not improved at all since her previous visit. She declined injections at the last evaluation but would like to try them today. She states she has been taking the Diclofenac as directed. Walking and standing for long periods of time increases the pain. Patient is here for further evaluation and treatment.    Past Medical History:  Diagnosis Date  . Ankle ulcer (HCC)    right  . Childhood asthma   . Chronic lower back pain   . GERD (gastroesophageal reflux disease)   . Headache    "only when I'm hungry" (10/14/2015)  . Hypertension   . Obesity   . Varicose veins   . VSD (ventricular septal defect)    "had hole in bottom of her heart; closed up on it's own; no OR" /mom 10/14/2015)     Objective: Physical Exam General: The patient is alert and oriented x3 in no acute distress.  Dermatology: Skin is warm, dry and supple bilateral lower extremities. Negative for open lesions or macerations bilateral.   Vascular: Dorsalis Pedis and Posterior Tibial pulses palpable bilateral.  Capillary fill time is immediate to all digits.  Neurological: Epicritic and protective threshold intact bilateral.   Musculoskeletal: Tenderness to palpation to the plantar aspect of the left heel along the plantar fascia. All other joints range of motion within normal limits bilateral. Strength 5/5 in all groups bilateral.    Assessment: 1. Plantar fasciitis left foot  Plan of Care:  1. Patient evaluated.  2. Injection of 0.5 mLs Celestone Soluspan injected into the patient's left heel.  3. Refill prescription for Diclofenac provided to patient.  4. Prescription for Medrol Dose Pak provided to patient.  5. Patient still needs to make appointment for custom molded orthotic modification.  6. Return to clinic in 4 weeks.    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  Felecia ShellingBrent M.  Evans, DPM [facility]  Dr. Felecia ShellingBrent M. Evans, DPM    [address]                                     , [place] [address]                Office [phone]  Fax [phone]

## 2017-12-04 ENCOUNTER — Ambulatory Visit: Payer: BLUE CROSS/BLUE SHIELD | Admitting: Podiatry

## 2017-12-23 ENCOUNTER — Ambulatory Visit: Payer: BLUE CROSS/BLUE SHIELD | Admitting: Podiatry

## 2017-12-23 ENCOUNTER — Telehealth: Payer: Self-pay | Admitting: Podiatry

## 2017-12-23 MED ORDER — DICLOFENAC SODIUM 75 MG PO TBEC: 75.0000 mg | DELAYED_RELEASE_TABLET | Freq: Two times a day (BID) | ORAL | 1 refills | Status: DC

## 2017-12-23 NOTE — Addendum Note (Signed)
Addended by: Alphia Kava D on: 12/23/2017 04:00 PM   Modules accepted: Orders

## 2017-12-23 NOTE — Telephone Encounter (Signed)
Dr. Logan Bores states refill and pt needs an appt prior to future refills.

## 2017-12-23 NOTE — Telephone Encounter (Signed)
Pt would like a refill on diclofenac  ?

## 2018-01-01 ENCOUNTER — Other Ambulatory Visit (HOSPITAL_COMMUNITY)
Admission: RE | Admit: 2018-01-01 | Discharge: 2018-01-01 | Disposition: A | Discharge status: Home / Self Care | Payer: BLUE CROSS/BLUE SHIELD | Source: Ambulatory Visit | Attending: Family Medicine | Admitting: Family Medicine

## 2018-01-01 ENCOUNTER — Other Ambulatory Visit: Payer: Self-pay

## 2018-01-01 ENCOUNTER — Encounter: Payer: Self-pay | Admitting: Podiatry

## 2018-01-01 ENCOUNTER — Encounter: Payer: Self-pay | Admitting: Family Medicine

## 2018-01-01 ENCOUNTER — Ambulatory Visit (INDEPENDENT_AMBULATORY_CARE_PROVIDER_SITE_OTHER): Payer: BLUE CROSS/BLUE SHIELD | Admitting: Family Medicine

## 2018-01-01 ENCOUNTER — Ambulatory Visit (INDEPENDENT_AMBULATORY_CARE_PROVIDER_SITE_OTHER): Payer: BLUE CROSS/BLUE SHIELD | Admitting: Podiatry

## 2018-01-01 VITALS — BP 122/80 | HR 75 | Temp 98.1°F | Ht 66.0 in | Wt 368.0 lb

## 2018-01-01 DIAGNOSIS — Z6841 Body Mass Index (BMI) 40.0 and over, adult: Secondary | ICD-10-CM | POA: Diagnosis not present

## 2018-01-01 DIAGNOSIS — K219 Gastro-esophageal reflux disease without esophagitis: Secondary | ICD-10-CM | POA: Diagnosis not present

## 2018-01-01 DIAGNOSIS — Z202 Contact with and (suspected) exposure to infections with a predominantly sexual mode of transmission: Secondary | ICD-10-CM

## 2018-01-01 DIAGNOSIS — B373 Candidiasis of vulva and vagina: Secondary | ICD-10-CM

## 2018-01-01 DIAGNOSIS — M722 Plantar fascial fibromatosis: Secondary | ICD-10-CM

## 2018-01-01 DIAGNOSIS — Z124 Encounter for screening for malignant neoplasm of cervix: Secondary | ICD-10-CM | POA: Insufficient documentation

## 2018-01-01 DIAGNOSIS — B3731 Acute candidiasis of vulva and vagina: Secondary | ICD-10-CM

## 2018-01-01 LAB — POCT GLYCOSYLATED HEMOGLOBIN (HGB A1C): Hemoglobin A1C: 5.2 % (ref 4.0–5.6)

## 2018-01-01 MED ORDER — PANTOPRAZOLE SODIUM 20 MG PO TBEC: 20.0000 mg | DELAYED_RELEASE_TABLET | Freq: Every day | ORAL | 1 refills | Status: DC

## 2018-01-01 NOTE — Patient Instructions (Signed)
It was a pleasure to see you today! Thank you for choosing [facility] for your primary care. [name] was seen for pap smear, weight and GERD. Come back to the clinic if you have any other routine needs, and go to the emergency room if you have life threatening symtpoms.  Today we did a pap at your request due to family history and checked for infections as well.  We'll call you with the results.  We also referred you to nutrition, if you don't hear from them in 2 wks please call out clinic and ask about the referral.  We also ordered a medicine that can help with your gastric reflux but want to remind you that diet can really impact that as well.    If we did any lab work today that did not result today, one of two things will happen.  1. If everything is normal, you will get a letter in mail sent to the address in your chart with the results for your records.  It is important to keep your address up to date as that is where we will send results.  2. If the results require some sort of discussion, my nurses or myself will call you on the phone number listed in your records.  It is important to keep your phone number up to date in our system as this is how we will try to reach you.  If we cannot reach you on the phone, we will try to send you a letter in the mail so please enable to voicemail function of your phone.  If you don't hear from us in two weeks, please give us a call to verify your results. Otherwise, we look forward to seeing you again at your next visit. If you have any questions or concerns before then, please call the clinic at [phone].   Please bring all your medications to every doctors visit   Sign up for My Chart to have easy access to your labs results, and communication with your Primary care physician.     Please check-out at the front desk before leaving the clinic.     Best,  Dr. Marthenia RollingScott Lashelle [name] FAMILY MEDICINE RESIDENT - PGY2 01/01/2018 10:41 AM

## 2018-01-01 NOTE — Progress Notes (Signed)
    Subjective:  [name] is a 32 y.o. female who [facility] with a chief complaint of weight, GERD and wanting pap smear.   HPI: Weight: Patient is concerned about inability to manage weight.  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  She doesn't particularly exercise because she walks around all day at work and is tired at the end of the day but admits that her work activity doesn't really "count" as exercise.  She would like to take active steps to get this under control and asks to see nutritionist and check cholesterol.  We also discussed some minor exercise additions she can do like bodyweight squats or walking in place during commercials on TV.    GERD: Classic GERD symptoms, somewhat helped since she has been conscious to not lie down right after eating. She does drink a lot of soda as well.   Pap smear: She would like a pap smear because she has  Family history of cervical cancer.  She thinks there is an increase lately in white discharge but no lesions/abnormal bleeding/pain/itchiness    Objective:  Physical Exam: BP 122/80 (BP Location: Left Arm)   Pulse 75   Temp 98.1 F (36.7 C) (Oral)   Ht 5\' 6"  (1.676 m)   Wt (!) 368 lb (166.9 kg)   LMP 12/01/2017 (Approximate)   BMI 59.40 kg/m   Gen: NAD, resting comfortably CV: RRR with no murmurs appreciated Pulm: NWOB, CTAB with no crackles, wheezes, or rhonchi GI: Normal bowel sounds present. Soft, Nontender, Nondistended. MSK: no edema, cyanosis, or clubbing noted GU: *pap performed with CMA in room entire time*  No visible deformities, slight amount thick white discharge Skin: warm, dry Neuro: grossly normal, moves all extremities Psych: Normal affect and thought content  No results found for this or any previous visit (from the past 72 hour(s)).   Assessment/Plan:  Possible exposure to STD STI tests neg.  Pap smear for  cervical cancer screening Requested prior to "due" date because of family history of cervical cancer.  Negative results  Obesity Patient requests nutrition referral and is going to try to make healthier choices at work as well as do some physical activity during commercials on TV  Candida vaginitis Diflucan called into pharmacy  Gastroesophageal reflux disease Mild symtpoms, will try trimming down on sodas and adding pantoprazole   Marthenia RollingScott Sadia Belfiore, DO FAMILY MEDICINE RESIDENT - PGY2 01/06/2018 7:52 AM

## 2018-01-02 LAB — LIPID PANEL
CHOL/HDL RATIO: 2.2 ratio (ref 0.0–4.4)
Cholesterol, Total: 120 mg/dL (ref 100–199)
HDL: 54 mg/dL (ref >39)
LDL CALC: 55 mg/dL (ref 0–99)
Triglycerides: 57 mg/dL (ref 0–149)
VLDL CHOLESTEROL CAL: 11 mg/dL (ref 5–40)

## 2018-01-03 LAB — CYTOLOGY - PAP
ADEQUACY: ABSENT
CHLAMYDIA, DNA PROBE: NEGATIVE
Diagnosis: NEGATIVE
HPV: NOT DETECTED
Neisseria Gonorrhea: NEGATIVE
Trichomonas: NEGATIVE

## 2018-01-04 NOTE — Progress Notes (Signed)
   Subjective: 32 year old female presenting today for follow up evaluation of plantar fasciitis of the left foot. She reports constant soreness of the left foot. The pain is worse when she first wakes up in the morning and tries to walk. Being on her feet all day also increases the pain. She has taken Diclofenac in the past for treatment with some relief. Patient is here for further evaluation and treatment.    Past Medical History:  Diagnosis Date  . Ankle ulcer (HCC)    right  . Childhood asthma   . Chronic lower back pain   . GERD (gastroesophageal reflux disease)   . Headache    "only when I'm hungry" (10/14/2015)  . Hypertension   . Obesity   . Varicose veins   . VSD (ventricular septal defect)    "had hole in bottom of her heart; closed up on it's own; no OR" /mom 10/14/2015)     Objective: Physical Exam General: The patient is alert and oriented x3 in no acute distress.  Dermatology: Skin is warm, dry and supple bilateral lower extremities. Negative for open lesions or macerations bilateral.   Vascular: Dorsalis Pedis and Posterior Tibial pulses palpable bilateral.  Capillary fill time is immediate to all digits.  Neurological: Epicritic and protective threshold intact bilateral.   Musculoskeletal: Tenderness to palpation to the plantar aspect of the left heel along the plantar fascia. All other joints range of motion within normal limits bilateral. Strength 5/5 in all groups bilateral.    Assessment: 1. Plantar fasciitis left foot  Plan of Care:  1. Patient evaluated.  2. Injection of 0.5 mLs Celestone Soluspan injected into the patient's left heel.  3. Refill prescription for Diclofenac provided to patient.  4. Recommended good shoe gear.  5. Return to clinic as needed.    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   Felecia ShellingBrent M. Shonta Bourque, DPM [facility]  Dr. Felecia ShellingBrent M. Nalda Shackleford, DPM    [address]                                      [place], [place] [address]                Office [phone]) [phone]  Fax [phone]

## 2018-01-06 ENCOUNTER — Telehealth: Payer: Self-pay

## 2018-01-06 DIAGNOSIS — B373 Candidiasis of vulva and vagina: Secondary | ICD-10-CM | POA: Insufficient documentation

## 2018-01-06 DIAGNOSIS — K219 Gastro-esophageal reflux disease without esophagitis: Secondary | ICD-10-CM | POA: Insufficient documentation

## 2018-01-06 DIAGNOSIS — B3731 Acute candidiasis of vulva and vagina: Secondary | ICD-10-CM | POA: Insufficient documentation

## 2018-01-06 DIAGNOSIS — Z124 Encounter for screening for malignant neoplasm of cervix: Secondary | ICD-10-CM | POA: Insufficient documentation

## 2018-01-06 HISTORY — DX: Encounter for screening for malignant neoplasm of cervix: Z12.4

## 2018-01-06 MED ORDER — FLUCONAZOLE 150 MG PO TABS: 150.0000 mg | ORAL_TABLET | Freq: Once | ORAL | 0 refills | Status: AC

## 2018-01-06 NOTE — Assessment & Plan Note (Signed)
Mild symtpoms, will try trimming down on sodas and adding pantoprazole

## 2018-01-06 NOTE — Assessment & Plan Note (Signed)
Requested prior to "due" date because of family history of cervical cancer.  Negative results

## 2018-01-06 NOTE — Assessment & Plan Note (Signed)
Diflucan called into pharmacy ?

## 2018-01-06 NOTE — Telephone Encounter (Signed)
LVM as indicated from Dr. Tedra SenegalBland's message below. [name] SpillersSharon T Alijah [name], CMA

## 2018-01-06 NOTE — Assessment & Plan Note (Signed)
Patient requests nutrition referral and is going to try to make healthier choices at work as well as do some physical activity during commercials on TV

## 2018-01-06 NOTE — Assessment & Plan Note (Signed)
STI tests neg.

## 2018-01-06 NOTE — Telephone Encounter (Signed)
-----   Message from Marthenia RollingScott Bland, DO sent at 01/06/2018  7:34 AM EDT ----- Please call patient, it is ok to leave message per her:  "Dr. Parke SimmersBland said your Lab indicated a yeast infection which can be treated with a single dose of "diflucan" which has already been called into the pharmacy.  All of your other tests were "negative" which means we didn't find anything else that needs treatment."

## 2018-01-16 ENCOUNTER — Emergency Department (HOSPITAL_COMMUNITY): Payer: BLUE CROSS/BLUE SHIELD

## 2018-01-16 ENCOUNTER — Other Ambulatory Visit: Payer: Self-pay

## 2018-01-16 ENCOUNTER — Encounter (HOSPITAL_COMMUNITY): Payer: Self-pay | Admitting: *Deleted

## 2018-01-16 ENCOUNTER — Emergency Department (HOSPITAL_COMMUNITY)
Admission: EM | Admit: 2018-01-16 | Discharge: 2018-01-16 | Disposition: A | Discharge status: Home / Self Care | Payer: BLUE CROSS/BLUE SHIELD | Attending: Emergency Medicine | Admitting: Emergency Medicine

## 2018-01-16 DIAGNOSIS — I10 Essential (primary) hypertension: Secondary | ICD-10-CM | POA: Diagnosis not present

## 2018-01-16 DIAGNOSIS — R51 Headache: Secondary | ICD-10-CM | POA: Diagnosis not present

## 2018-01-16 DIAGNOSIS — R519 Headache, unspecified: Secondary | ICD-10-CM

## 2018-01-16 MED ORDER — KETOROLAC TROMETHAMINE 60 MG/2ML IM SOLN
60.0000 mg | Freq: Once | INTRAMUSCULAR | Status: AC
  Administered 2018-01-16: 60 mg via INTRAMUSCULAR
  Filled 2018-01-16: qty 2

## 2018-01-16 NOTE — ED Triage Notes (Signed)
Pt complains of headache for the past week.

## 2018-01-17 NOTE — ED Provider Notes (Signed)
[facility]-EMERGENCY DEPT Provider Note   CSN: [id number] Arrival date & time: 01/16/18  0950     History   Chief Complaint Chief Complaint  Patient presents with  . Headache    HPI [name] is a 32 y.o. female.   Headache   This is a new problem. The current episode started yesterday. The problem occurs constantly. The problem has not changed since onset.The headache is associated with nothing. The quality of the pain is described as dull. The pain is moderate. The pain does not radiate. Pertinent negatives include no anorexia. She has tried nothing for the symptoms.    Past Medical History:  Diagnosis Date  . Ankle ulcer (HCC)    right  . Childhood asthma   . Chronic lower back pain   . GERD (gastroesophageal reflux disease)   . Headache    "only when I'm hungry" (10/14/2015)  . Hypertension   . Obesity   . Varicose veins   . VSD (ventricular septal defect)    "had hole in bottom of her heart; closed up on it's own; no OR" /mom 10/14/2015)    Patient Active Problem List   Diagnosis Date Noted  . Pap smear for cervical cancer screening 01/06/2018  . Gastroesophageal reflux disease 01/06/2018  . Candida vaginitis 01/06/2018  . Dizziness 06/06/2017  . Leg edema 12/12/2016  . Orthopnea 12/12/2016  . Snoring 12/12/2016  . Plantar fasciitis 12/12/2016  . Umbilical hernia 10/14/2015  . Epigastric abdominal pain 03/01/2015  . 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 Low back pain 04/23/2014  . Possible exposure to STD 01/25/2014  . Birth control counseling 01/25/2014  . Essential hypertension, benign 11/16/2013  . Well woman exam 11/16/2013  . Elevated BP 10/08/2013  . Menorrhagia 10/08/2013  . History of iritis 10/08/2013  . Varicose veins of lower extremities with other complications 10/25/2011  . Obesity 10/25/2011    Past Surgical History:  Procedure Laterality Date  . HERNIA REPAIR    .  INSERTION OF MESH N/A 10/14/2015   Procedure: INSERTION OF MESH;  Surgeon: Avel Peace, MD;  Location: [facility] OR;  Service: General;  Laterality: N/A;  . LAPAROSCOPIC INCISIONAL / UMBILICAL / VENTRAL HERNIA REPAIR  10/14/2015   VHR w/mesh  . TONSILLECTOMY AND ADENOIDECTOMY  1990s  . TYMPANOSTOMY TUBE PLACEMENT Bilateral 1990s  . VEIN LIGATION AND STRIPPING Right 12/15/2014   Procedure: SAPHENOUS VEIN LIGATION AND STRIPPING; GREATER THAN 20 STAB PHLEBECTOMIES;  Surgeon: Larina Earthly, MD;  Location: [facility] OR;  Service: Vascular;  Laterality: Right;  . VENTRAL HERNIA REPAIR N/A 10/14/2015   Procedure: LAPAROSCOPIC VENTRAL HERNIA;  Surgeon: Avel Peace, MD;  Location: [facility] OR;  Service: General;  Laterality: N/A;     OB History   None      Home Medications    Prior to Admission medications   Medication Sig Start Date End Date Taking? Authorizing Provider  diclofenac (VOLTAREN) 75 MG EC tablet Take 1 tablet (75 mg total) by mouth 2 (two) times daily. 12/23/17   Felecia Shelling, DPM  hydrochlorothiazide (HYDRODIURIL) 12.5 MG tablet TAKE 1 TABLET (12.5 MG TOTAL) BY MOUTH DAILY. 10/08/16   Raliegh Ip, DO  loratadine (CLARITIN) 10 MG tablet Take 1 tablet (10 mg total) by mouth daily. 08/22/16   Raliegh Ip, DO  meclizine (ANTIVERT) 25 MG tablet Take 1 tablet (25 mg total) by mouth 3 (three) times daily as needed for  dizziness. 06/13/17   Shirley, [place], DO  methylPREDNISolone (MEDROL DOSEPAK) 4 MG TBPK tablet 6 day dose pack - take as directed 11/06/17   Felecia Shelling, DPM  Multiple Vitamin (MULTIVITAMIN) tablet Take 2 tablets by mouth daily. Vitafusion gummies    [provider]  pantoprazole (PROTONIX) 20 MG tablet Take 1 tablet (20 mg total) by mouth daily. 01/01/18   Marthenia Rolling, DO    Family History Family History  Problem Relation Age of Onset  . Diabetes Mother   . Varicose Veins Mother   . Hypertension Mother   . Hyperlipidemia Mother   . Varicose Veins Sister   .  Cancer Sister        unsure of type but requiring hysterectomy    Social History Social History   Tobacco Use  . Smoking status: Never Smoker  . Smokeless tobacco: Never Used  Substance Use Topics  . Alcohol use: Yes    Comment: 08/2016, socially  . Drug use: Yes    Types: Marijuana    Comment: last 07/2016, trying to stop     Allergies   Tramadol   Review of Systems Review of Systems  Gastrointestinal: Negative for anorexia.  Neurological: Positive for headaches.  All other systems reviewed and are negative.    Physical Exam Updated Vital Signs BP 103/63 (BP Location: Right Arm)   Pulse 79   Temp 98.1 F (36.7 C) (Oral)   Resp 16   LMP 12/15/2017   SpO2 99%   Physical Exam  Constitutional: She appears well-developed and well-nourished.  HENT:  Head: Normocephalic and atraumatic.  Eyes: Pupils are equal, round, and reactive to light. EOM are normal.  Neck: Normal range of motion.  Cardiovascular: Normal rate and regular rhythm.  Pulmonary/Chest: No stridor. No respiratory distress.  Abdominal: Soft. She exhibits no distension.  Musculoskeletal: Normal range of motion. She exhibits no edema or tenderness.  Neurological: She is alert.  No altered mental status, able to give full seemingly accurate history.  Face is symmetric, EOM's intact, pupils equal and reactive, vision intact, tongue and uvula midline without deviation. Upper and Lower extremity motor 5/5, intact pain perception in distal extremities, 2+ reflexes in biceps, patella and achilles tendons. Able to perform finger to nose normal with both hands. Walks without assistance or evident ataxia.   Skin: Skin is warm and dry.  Nursing note and vitals reviewed.    ED Treatments / Results  Labs (all labs ordered are listed, but only abnormal results are displayed) Labs Reviewed - No data to display  EKG None  Radiology Ct Head Wo Contrast  Result Date: 01/16/2018 CLINICAL DATA:  Headache for 1  week. EXAM: CT HEAD WITHOUT CONTRAST TECHNIQUE: Contiguous axial images were obtained from the base of the skull through the vertex without intravenous contrast. COMPARISON:  None. FINDINGS: Brain: No evidence of acute infarction, hemorrhage, hydrocephalus, extra-axial collection or mass lesion/mass effect. Vascular: No hyperdense vessel or unexpected calcification. Skull: Normal. Negative for fracture or focal lesion. Sinuses/Orbits: No acute finding. Other: None. IMPRESSION: No acute intracranial abnormality. Electronically Signed   By: Ted Mcalpine M.D.   On: 01/16/2018 14:57    Procedures Procedures (including critical care time)  Medications Ordered in ED Medications  ketorolac (TORADOL) injection 60 mg (60 mg Intramuscular Given 01/16/18 1413)     Initial Impression / Assessment and Plan / ED Course  I have reviewed the triage vital signs and the nursing notes.  Pertinent labs & imaging results that  were available during my care of the patient were reviewed by me and considered in my medical decision making (see chart for details).   The patient arrived with signs and symptoms consistent with a migraine headache. The patient has history of migraines. This feels like previous migraines.The patient has a reassuring neuro exam lessening chances of intracranial abnormalities. The patient was given toradol with patient's pain significantly improved and is ready for discharge. The patient remained in no acute distress, hemodynamically stable with reassuring neuro exam. The patient was then discharged from the Emergency Department with no further acute issues. Recommend follow up with neurologist or PCP within the next week.   Final Clinical Impressions(s) / ED Diagnoses   Final diagnoses:  Acute nonintractable headache, unspecified headache type     Marily Memos, MD 01/17/18 2332

## 2018-04-05 ENCOUNTER — Ambulatory Visit (INDEPENDENT_AMBULATORY_CARE_PROVIDER_SITE_OTHER): Payer: BLUE CROSS/BLUE SHIELD | Admitting: Podiatry

## 2018-04-05 ENCOUNTER — Encounter: Payer: Self-pay | Admitting: Podiatry

## 2018-04-05 VITALS — BP 123/90 | HR 65 | Wt 360.0 lb

## 2018-04-05 DIAGNOSIS — M722 Plantar fascial fibromatosis: Secondary | ICD-10-CM | POA: Diagnosis not present

## 2018-04-05 MED ORDER — DICLOFENAC SODIUM 75 MG PO TBEC: 75.0000 mg | DELAYED_RELEASE_TABLET | Freq: Two times a day (BID) | ORAL | 2 refills | Status: DC

## 2018-04-05 MED ORDER — METHYLPREDNISOLONE 4 MG PO TBPK: ORAL_TABLET | ORAL | 0 refills | Status: DC

## 2018-04-12 NOTE — Progress Notes (Signed)
   Subjective: 32 year old female presenting today for follow up evaluation of plantar fasciitis of the left foot. She reports continued severe pain in the arch and heel. She has been taking Diclofenac for treatment but is currently out. She states her custom orthotics cause more pain. Patient is here for further evaluation and treatment.    Past Medical History:  Diagnosis Date  . Ankle ulcer (HCC)    right  . Childhood asthma   . Chronic lower back pain   . GERD (gastroesophageal reflux disease)   . Headache    "only when I'm hungry" (10/14/2015)  . Hypertension   . Obesity   . Varicose veins   . VSD (ventricular septal defect)    "had hole in bottom of her heart; closed up on it's own; no OR" /mom 10/14/2015)     Objective: Physical Exam General: The patient is alert and oriented x3 in no acute distress.  Dermatology: Skin is warm, dry and supple bilateral lower extremities. Negative for open lesions or macerations bilateral.   Vascular: Dorsalis Pedis and Posterior Tibial pulses palpable bilateral.  Capillary fill time is immediate to all digits.  Neurological: Epicritic and protective threshold intact bilateral.   Musculoskeletal: Tenderness to palpation to the plantar aspect of the left heel along the plantar fascia. All other joints range of motion within normal limits bilateral. Strength 5/5 in all groups bilateral.    Assessment: 1. Plantar fasciitis left foot  Plan of Care:  1. Patient evaluated.  2. Injection of 0.5 mLs Celestone Soluspan injected into the patient's left heel.  3. Refill prescription for Diclofenac provided to patient.  4. Prescription for Medrol Dose Pak provided to patient.  5. Recommended OTC insoles. Custom orthotics hurt her feet.  6. Return to clinic as needed.    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   Felecia ShellingBrent M. Daion Ginsberg, DPM [facility]  Dr. Felecia ShellingBrent M. Messina Kosinski, DPM    [address]                                     [place], [place] [address]                Office [phone]  Fax [phone]

## 2018-04-14 ENCOUNTER — Other Ambulatory Visit: Payer: Self-pay

## 2018-04-14 ENCOUNTER — Emergency Department (HOSPITAL_COMMUNITY): Payer: BLUE CROSS/BLUE SHIELD

## 2018-04-14 ENCOUNTER — Emergency Department (HOSPITAL_COMMUNITY)
Admission: EM | Admit: 2018-04-14 | Discharge: 2018-04-15 | Disposition: A | Discharge status: Home / Self Care | Payer: BLUE CROSS/BLUE SHIELD | Attending: Emergency Medicine | Admitting: Emergency Medicine

## 2018-04-14 ENCOUNTER — Encounter (HOSPITAL_COMMUNITY): Payer: Self-pay | Admitting: Emergency Medicine

## 2018-04-14 DIAGNOSIS — J45909 Unspecified asthma, uncomplicated: Secondary | ICD-10-CM | POA: Insufficient documentation

## 2018-04-14 DIAGNOSIS — I10 Essential (primary) hypertension: Secondary | ICD-10-CM | POA: Insufficient documentation

## 2018-04-14 DIAGNOSIS — Z79899 Other long term (current) drug therapy: Secondary | ICD-10-CM | POA: Insufficient documentation

## 2018-04-14 DIAGNOSIS — L03011 Cellulitis of right finger: Secondary | ICD-10-CM | POA: Diagnosis not present

## 2018-04-14 DIAGNOSIS — M79644 Pain in right finger(s): Secondary | ICD-10-CM | POA: Diagnosis not present

## 2018-04-14 DIAGNOSIS — R2232 Localized swelling, mass and lump, left upper limb: Secondary | ICD-10-CM | POA: Diagnosis not present

## 2018-04-14 MED ORDER — LIDOCAINE HCL 2 % IJ SOLN
10.0000 mL | Freq: Once | INTRAMUSCULAR | Status: AC
  Administered 2018-04-15: 200 mg

## 2018-04-14 NOTE — ED Triage Notes (Signed)
Patient complaining of pointer finger pain. Patient states it is swollen and throbbing. Patient denies injury.

## 2018-04-14 NOTE — ED Provider Notes (Signed)
[facility]-EMERGENCY DEPT Provider Note: Lowella DellJ. Lane Eliette Drumwright, MD, FACEP  CSN: [id number] MRN: [id number] ARRIVAL: 04/14/18 at 2313 ROOM: WA03/WA03   CHIEF COMPLAINT  Finger Pain   HISTORY OF PRESENT ILLNESS  04/14/18 11:54 PM [name] is a 32 y.o. female with a 2-day history of pain along the thenar side of her left index finger nail.  There is associated swelling and she rates the pain as a 10 out of 10, worse with palpation or movement.  She thinks this may have been initiated by removing or biting off a hangnail.   Past Medical History:  Diagnosis Date  . Ankle ulcer (HCC)    right  . Childhood asthma   . Chronic lower back pain   . GERD (gastroesophageal reflux disease)   . Headache    "only when I'm hungry" (10/14/2015)  . Hypertension   . Obesity   . Varicose veins   . VSD (ventricular septal defect)    "had hole in bottom of her heart; closed up on it's own; no OR" /mom 10/14/2015)    Past Surgical History:  Procedure Laterality Date  . HERNIA REPAIR    . INSERTION OF MESH N/A 10/14/2015   Procedure: INSERTION OF MESH;  Surgeon: Avel Peaceodd Rosenbower, MD;  [facility];  Service: General;  Laterality: N/A;  . LAPAROSCOPIC INCISIONAL / UMBILICAL / VENTRAL HERNIA REPAIR  10/14/2015   VHR w/mesh  . TONSILLECTOMY AND ADENOIDECTOMY  1990s  . TYMPANOSTOMY TUBE PLACEMENT Bilateral 1990s  . VEIN LIGATION AND STRIPPING Right 12/15/2014   Procedure: SAPHENOUS VEIN LIGATION AND STRIPPING; GREATER THAN 20 STAB PHLEBECTOMIES;  Surgeon: Larina Earthlyodd F Early, MD;  [facility];  Service: Vascular;  Laterality: Right;  . VENTRAL HERNIA REPAIR N/A 10/14/2015   Procedure: LAPAROSCOPIC VENTRAL HERNIA;  Surgeon: Avel Peaceodd Rosenbower, MD;  [facility];  Service: General;  Laterality: N/A;    Family History  Problem Relation Age of Onset  . Diabetes Mother   . Varicose Veins Mother   . Hypertension Mother   . Hyperlipidemia Mother   . Varicose Veins Sister   . Cancer Sister        unsure of  type but requiring hysterectomy    Social History   Tobacco Use  . Smoking status: Never Smoker  . Smokeless tobacco: Never Used  Substance Use Topics  . Alcohol use: Yes    Comment: 08/2016, socially  . Drug use: Yes    Types: Marijuana    Comment: last 07/2016, trying to stop    Prior to Admission medications   Medication Sig Start Date End Date Taking? Authorizing Provider  diclofenac (VOLTAREN) 75 MG EC tablet Take 1 tablet (75 mg total) by mouth 2 (two) times daily. 04/05/18   Felecia ShellingEvans, Brent M, DPM  hydrochlorothiazide (HYDRODIURIL) 12.5 MG tablet TAKE 1 TABLET (12.5 MG TOTAL) BY MOUTH DAILY. Patient not taking: Reported on 04/05/2018 10/08/16   Raliegh IpGottschalk, Ashly M, DO  loratadine (CLARITIN) 10 MG tablet Take 1 tablet (10 mg total) by mouth daily. Patient not taking: Reported on 04/05/2018 08/22/16   Raliegh IpGottschalk, Ashly M, DO  meclizine (ANTIVERT) 25 MG tablet Take 1 tablet (25 mg total) by mouth 3 (three) times daily as needed for dizziness. Patient not taking: Reported on 04/05/2018 06/13/17   Shirley, [place], DO  methylPREDNISolone (MEDROL DOSEPAK) 4 MG TBPK tablet 6 day dose pack - take as directed 04/05/18   Felecia ShellingEvans, Brent M, DPM  Multiple Vitamin (MULTIVITAMIN) tablet Take 2 tablets by mouth  daily. Vitafusion gummies    [provider]  pantoprazole (PROTONIX) 20 MG tablet Take 1 tablet (20 mg total) by mouth daily. 01/01/18   Marthenia RollingBland, Scott, DO    Allergies Tramadol   REVIEW OF SYSTEMS  Negative except as noted here or in the History of Present Illness.   PHYSICAL EXAMINATION  Initial Vital Signs Blood pressure 121/89, pulse 90, temperature 97.7 F (36.5 C), temperature source Oral, resp. rate 18, height 5\' 6"  (1.676 m), weight (!) 161 kg, SpO2 100 %.  Examination General: Well-developed, obese female in no acute distress; appearance consistent with age of record HENT: normocephalic; atraumatic Eyes: Normal appearance Neck: supple Heart: regular rate and  rhythm Lungs: clear to auscultation bilaterally Abdomen: soft; nondistended Extremities: No deformity; full range of motion Neurologic: Awake, alert and oriented; motor function intact in all extremities and symmetric; no facial droop Skin: Warm and dry; tenderness and swelling along the thenar side of the left index finger nail consistent with paronychia, no felon present Psychiatric: Normal mood and affect   RESULTS  Summary of this visit's results, reviewed by myself:   EKG Interpretation  Date/Time:    Ventricular Rate:    PR Interval:    QRS Duration:   QT Interval:    QTC Calculation:   R Axis:     Text Interpretation:        Laboratory Studies: No results found for this or any previous visit (from the past 24 hour(s)). Imaging Studies: Dg Finger Index Right  Result Date: 04/14/2018 CLINICAL DATA:  Acute onset of right index finger pain. EXAM: RIGHT INDEX FINGER 2+V COMPARISON:  Right hand radiographs performed 07/05/2013 FINDINGS: The right index finger appears grossly intact. Visualized joint spaces are preserved. Mild soft tissue swelling is noted about the second digit. IMPRESSION: No evidence of fracture or dislocation. Electronically Signed   By: Roanna RaiderJeffery  Chang M.D.   On: 04/14/2018 23:57    ED COURSE and MDM  Nursing notes and initial vitals signs, including pulse oximetry, reviewed.  Vitals:   04/14/18 2335 04/14/18 2336  BP: 121/89   Pulse: 90   Resp: 18   Temp: 97.7 F (36.5 C)   TempSrc: Oral   SpO2: 100%   Weight:  (!) 161 kg  Height:  5\' 6"  (1.676 m)    PROCEDURES   INCISION AND DRAINAGE Performed by: Carlisle BeersJohn L Azyah Flett Consent: Verbal consent obtained. Risks and benefits: risks, benefits and alternatives were discussed Type: Paronychia  Body area: Right index finger  Anesthesia: local infiltration  Incision was made with a scalpel.  Local anesthetic: lidocaine 2 % with epinephrine  Anesthetic total: 2.5 ml  Complexity: complex Blunt  dissection to break up loculations  Drainage: Sanguinous  Drainage amount: Moderate  Packing material: None  Patient tolerance: Patient tolerated the procedure well with no immediate complications.   ED DIAGNOSES     ICD-10-CM   1. Paronychia of right index finger L03.011        Lanson [name], [name] RuizJohn, MD 04/15/18 [phone]

## 2018-04-14 NOTE — ED Notes (Signed)
Bed: WA03 Expected date:  Expected time:  Means of arrival:  Comments: 

## 2018-04-15 MED ORDER — LIDOCAINE HCL 2 % IJ SOLN
INTRAMUSCULAR | Status: AC
  Filled 2018-04-15: qty 20

## 2018-06-01 ENCOUNTER — Emergency Department (HOSPITAL_COMMUNITY): Payer: BLUE CROSS/BLUE SHIELD

## 2018-06-01 ENCOUNTER — Emergency Department (HOSPITAL_COMMUNITY)
Admission: EM | Admit: 2018-06-01 | Discharge: 2018-06-01 | Disposition: A | Discharge status: Home / Self Care | Payer: BLUE CROSS/BLUE SHIELD | Attending: Emergency Medicine | Admitting: Emergency Medicine

## 2018-06-01 ENCOUNTER — Other Ambulatory Visit: Payer: Self-pay

## 2018-06-01 ENCOUNTER — Encounter (HOSPITAL_COMMUNITY): Payer: Self-pay | Admitting: Emergency Medicine

## 2018-06-01 DIAGNOSIS — Z79899 Other long term (current) drug therapy: Secondary | ICD-10-CM | POA: Diagnosis not present

## 2018-06-01 DIAGNOSIS — I10 Essential (primary) hypertension: Secondary | ICD-10-CM | POA: Diagnosis not present

## 2018-06-01 DIAGNOSIS — J069 Acute upper respiratory infection, unspecified: Secondary | ICD-10-CM | POA: Diagnosis not present

## 2018-06-01 DIAGNOSIS — R6883 Chills (without fever): Secondary | ICD-10-CM | POA: Diagnosis not present

## 2018-06-01 DIAGNOSIS — B9789 Other viral agents as the cause of diseases classified elsewhere: Secondary | ICD-10-CM | POA: Insufficient documentation

## 2018-06-01 DIAGNOSIS — R05 Cough: Secondary | ICD-10-CM | POA: Diagnosis not present

## 2018-06-01 DIAGNOSIS — J45909 Unspecified asthma, uncomplicated: Secondary | ICD-10-CM | POA: Insufficient documentation

## 2018-06-01 NOTE — ED Triage Notes (Signed)
Pt reports intermittent chills over the last few days. Treated self at 0430 this am with Motrin 400 mg for fever 102.9. Denies NVD. Reports coughing up thick blood tinged mucus. No audible wheezing noted.

## 2018-06-01 NOTE — Discharge Instructions (Addendum)
Return if any problems. See your Physician for recheck next week  

## 2018-06-01 NOTE — ED Provider Notes (Signed)
[facility]-EMERGENCY DEPT Provider Note   CSN: [id number] Arrival date & time: 06/01/18  1520     History   Chief Complaint Chief Complaint  Patient presents with  . Cough  . Nasal Congestion    HPI [name] is a 33 y.o. female.  The history is provided by the patient. No language interpreter was used.  Cough  Cough characteristics:  Productive Sputum characteristics:  Manson Passey Severity:  Moderate Onset quality:  Gradual Duration:  3 days Timing:  Constant Progression:  Worsening Smoker: no   Context: upper respiratory infection   Relieved by:  Nothing Worsened by:  Nothing Ineffective treatments:  None tried Associated symptoms: sore throat   Associated symptoms: no shortness of breath     Past Medical History:  Diagnosis Date  . Ankle ulcer (HCC)    right  . Childhood asthma   . Chronic lower back pain   . GERD (gastroesophageal reflux disease)   . Headache    "only when I'm hungry" (10/14/2015)  . Hypertension   . Obesity   . Varicose veins   . VSD (ventricular septal defect)    "had hole in bottom of her heart; closed up on it's own; no OR" /mom 10/14/2015)    Patient Active Problem List   Diagnosis Date Noted  . Pap smear for cervical cancer screening 01/06/2018  . Gastroesophageal reflux disease 01/06/2018  . Candida vaginitis 01/06/2018  . Dizziness 06/06/2017  . Leg edema 12/12/2016  . Orthopnea 12/12/2016  . Snoring 12/12/2016  . Plantar fasciitis 12/12/2016  . Umbilical hernia 10/14/2015  . Epigastric abdominal pain 03/01/2015  . 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 Low back pain 04/23/2014  . Possible exposure to STD 01/25/2014  . Birth control counseling 01/25/2014  . Essential hypertension, benign 11/16/2013  . Well woman exam 11/16/2013  . Elevated BP 10/08/2013  . Menorrhagia 10/08/2013  . History of iritis 10/08/2013  . Varicose veins of lower extremities with  other complications 10/25/2011  . Obesity 10/25/2011    Past Surgical History:  Procedure Laterality Date  . HERNIA REPAIR    . INSERTION OF MESH N/A 10/14/2015   Procedure: INSERTION OF MESH;  Surgeon: Avel Peace, MD;  Location[facility] OR;  Service: General;  Laterality: N/A;  . LAPAROSCOPIC INCISIONAL / UMBILICAL / VENTRAL HERNIA REPAIR  10/14/2015   VHR w/mesh  . TONSILLECTOMY AND ADENOIDECTOMY  1990s  . TYMPANOSTOMY TUBE PLACEMENT Bilateral 1990s  . VEIN LIGATION AND STRIPPING Right 12/15/2014   Procedure: SAPHENOUS VEIN LIGATION AND STRIPPING; GREATER THAN 20 STAB PHLEBECTOMIES;  Surgeon: Larina Earthly, MD;  Location: [facility] OR;  Service: Vascular;  Laterality: Right;  . VENTRAL HERNIA REPAIR N/A 10/14/2015   Procedure: LAPAROSCOPIC VENTRAL HERNIA;  Surgeon: Avel Peace, MD;  Location: [facility] OR;  Service: General;  Laterality: N/A;     OB History   No obstetric history on file.      Home Medications    Prior to Admission medications   Medication Sig Start Date End Date Taking? Authorizing Provider  diclofenac (VOLTAREN) 75 MG EC tablet Take 1 tablet (75 mg total) by mouth 2 (two) times daily. 04/05/18   Felecia Shelling, DPM  methylPREDNISolone (MEDROL DOSEPAK) 4 MG TBPK tablet 6 day dose pack - take as directed 04/05/18   Felecia Shelling, DPM  Multiple Vitamin (MULTIVITAMIN) tablet Take 2 tablets by mouth daily. Vitafusion gummies    [provider]  pantoprazole (PROTONIX) 20 MG tablet Take 1 tablet (20 mg total) by mouth daily. 01/01/18   Marthenia RollingBland, Scott, DO    Family History Family History  Problem Relation Age of Onset  . Diabetes Mother   . Varicose Veins Mother   . Hypertension Mother   . Hyperlipidemia Mother   . Varicose Veins Sister   . Cancer Sister        unsure of type but requiring hysterectomy    Social History Social History   Tobacco Use  . Smoking status: Never Smoker  . Smokeless tobacco: Never Used  Substance Use Topics  . Alcohol use: Yes     Comment:  socially  . Drug use: Yes    Types: Marijuana    Comment: last 07/2016, trying to stop     Allergies   Tramadol   Review of Systems Review of Systems  HENT: Positive for sore throat.   Respiratory: Positive for cough. Negative for shortness of breath.   All other systems reviewed and are negative.    Physical Exam Updated Vital Signs BP 107/65 (BP Location: Left Arm)   Pulse (!) 115   Temp 99.3 F (37.4 C) (Oral)   Resp 18   Ht 5\' 6"  (1.676 m)   Wt (!) 148.3 kg   LMP 05/16/2018 (Approximate)   SpO2 100%   BMI 52.78 kg/m   Physical Exam Vitals signs and nursing note reviewed.  HENT:     Head: Normocephalic.     Right Ear: Tympanic membrane normal.     Left Ear: Tympanic membrane normal.     Mouth/Throat:     Mouth: Mucous membranes are moist.  Eyes:     Pupils: Pupils are equal, round, and reactive to light.  Neck:     Musculoskeletal: Normal range of motion.  Cardiovascular:     Rate and Rhythm: Normal rate.  Pulmonary:     Effort: Pulmonary effort is normal.  Abdominal:     General: Abdomen is flat.  Musculoskeletal: Normal range of motion.  Skin:    General: Skin is warm.  Neurological:     General: No focal deficit present.     Mental Status: She is alert.  Psychiatric:        Mood and Affect: Mood normal.      ED Treatments / Results  Labs (all labs ordered are listed, but only abnormal results are displayed) Labs Reviewed - No data to display  EKG None  Radiology Dg Chest 2 View  Result Date: 06/01/2018 CLINICAL DATA:  Intermittent chills. EXAM: CHEST - 2 VIEW COMPARISON:  December 12, 2015 FINDINGS: Stable cardiomegaly. The hila, mediastinum, lungs, and pleura are otherwise unremarkable. IMPRESSION: No active cardiopulmonary disease. Electronically Signed   By: Gerome Samavid  Williams III M.D   On: 06/01/2018 16:24    Procedures Procedures (including critical care time)  Medications Ordered in ED Medications - No data to  display   Initial Impression / Assessment and Plan / ED Course  I have reviewed the triage vital signs and the nursing notes.  Pertinent labs & imaging results that were available during my care of the patient were reviewed by me and considered in my medical decision making (see chart for details).     Chest xray reviewed and discussed with pt.   Pt aware she has cardiomegaly.  Pt advised to follow up with Family practice.   Final Clinical Impressions(s) / ED Diagnoses   Final diagnoses:  Viral URI with  cough    ED Discharge Orders    None    An After Visit Summary was printed and given to the patient.    Elson Areas, [place] 06/01/18 1703    Sabas Sous, MD 06/04/18 1728

## 2018-06-18 ENCOUNTER — Ambulatory Visit (INDEPENDENT_AMBULATORY_CARE_PROVIDER_SITE_OTHER): Payer: BLUE CROSS/BLUE SHIELD | Admitting: Family Medicine

## 2018-06-18 ENCOUNTER — Other Ambulatory Visit: Payer: Self-pay

## 2018-06-18 VITALS — BP 122/78 | HR 81 | Temp 98.5°F | Wt 325.0 lb

## 2018-06-18 DIAGNOSIS — R05 Cough: Secondary | ICD-10-CM

## 2018-06-18 DIAGNOSIS — R058 Other specified cough: Secondary | ICD-10-CM

## 2018-06-18 NOTE — Progress Notes (Signed)
    Subjective:  [name] is a 33 y.o. female who presents to the [facility] today with a chief complaint of cough.   HPI:   Patient states that she has been sick for the last 2 weeks.  She was seen in the ED on 06/01/2018 for viral URI.  She states her symptoms have largely improved since then.  She no longer has fever or diarrhea.  She continues to have a cough that is slightly productive of yellowish-brownish sputum.  No hemoptysis.  No shortness of breath or wheezing.  She does have a little bit of clear rhinorrhea but that seems to be improving as well.  She has been taking over-the-counter cold and cough Alka-Seltzer and Robitussin.   ROS: Per HPI   Objective:  Physical Exam: BP 122/78   Pulse 81   Temp 98.5 F (36.9 C) (Oral)   Wt (!) 325 lb (147.4 kg)   SpO2 99%   BMI 52.46 kg/m   Gen: NAD, resting comfortably HEENT: [place], AT.  TMs pearly bilaterally.  Nasal mucosa slightly boggy with clear rhinorrhea.  Oropharynx nonerythematous without exudates. CV: RRR with no murmurs appreciated Pulm: NWOB, CTAB with no crackles, wheezes, or rhonchi GI: Normal bowel sounds present. Soft, Nontender, Nondistended. Skin: warm, dry Neuro: grossly normal, moves all extremities Psych: Normal affect and thought content  Assessment/Plan:  1. Post-viral cough syndrome Patient is well-appearing, afebrile.  She had a viral URI with cough 2 weeks ago that is largely improved but has a residual cough.  Her lungs are clear on exam with normal work of breathing.  Likely post viral cough versus a new viral URI given her continued rhinorrhea.  There are no signs of bacterial infection on exam.  Patient reassured and recommended supportive care.  Discussed return precautions including worsening shortness of breath or wheezing.  Leland Her, DO PGY-3, [facility] 06/18/2018 9:21 AM

## 2018-06-18 NOTE — Patient Instructions (Addendum)
Your lungs are clear which is good.    Keep staying well hydrated, honey for cough. Good hand hygiene.  Get your flu shot at the pharmacy.

## 2018-08-06 ENCOUNTER — Ambulatory Visit (INDEPENDENT_AMBULATORY_CARE_PROVIDER_SITE_OTHER): Payer: BLUE CROSS/BLUE SHIELD | Admitting: Podiatry

## 2018-08-06 ENCOUNTER — Other Ambulatory Visit: Payer: Self-pay

## 2018-08-06 ENCOUNTER — Ambulatory Visit (INDEPENDENT_AMBULATORY_CARE_PROVIDER_SITE_OTHER): Payer: BLUE CROSS/BLUE SHIELD

## 2018-08-06 VITALS — Temp 97.9°F

## 2018-08-06 DIAGNOSIS — M722 Plantar fascial fibromatosis: Secondary | ICD-10-CM

## 2018-08-06 DIAGNOSIS — M659 Synovitis and tenosynovitis, unspecified: Secondary | ICD-10-CM

## 2018-08-06 MED ORDER — METHYLPREDNISOLONE 4 MG PO TBPK: ORAL_TABLET | ORAL | 0 refills | Status: DC

## 2018-08-06 NOTE — Progress Notes (Signed)
   HPI: 33 year old female presents the office today for new complaint regarding left ankle pain.  Patient states of the last 2 weeks the pain is gotten worse and she is currently unable to ambulate for long periods of time.  Patient continues to work 50+ hours on her feet.  She does not recall injury or any change of activity.  Patient also deals with chronic plantar fasciitis to the left foot however it is currently tolerable  Past Medical History:  Diagnosis Date  . Ankle ulcer (HCC)    right  . Childhood asthma   . Chronic lower back pain   . GERD (gastroesophageal reflux disease)   . Headache    "only when I'm hungry" (10/14/2015)  . Hypertension   . Obesity   . Varicose veins   . VSD (ventricular septal defect)    "had hole in bottom of her heart; closed up on it's own; no OR" /mom 10/14/2015)     Physical Exam: General: The patient is alert and oriented x3 in no acute distress.  Dermatology: Skin is warm, dry and supple bilateral lower extremities. Negative for open lesions or macerations.  Vascular: Palpable pedal pulses bilaterally. No edema or erythema noted. Capillary refill within normal limits.  Neurological: Epicritic and protective threshold grossly intact bilaterally.   Musculoskeletal Exam: Range of motion within normal limits to all pedal and ankle joints bilateral. Muscle strength 5/5 in all groups bilateral.  Tenderness to palpation of the medial calcaneal tubercle of the left foot at the insertion of the plantar fascia.  Pain on palpation also noted to the lateral aspect of the left ankle joint.  Radiographic Exam:  Normal osseous mineralization. Joint spaces preserved. No fracture/dislocation/boney destruction.    Assessment: 1.  Synovitis left ankle 2.  Chronic plantar fasciitis left   Plan of Care:  1. Patient evaluated. X-Rays reviewed.  2.  Injection of 0.5 cc Celestone Soluspan injected left ankle joint 3.  Ankle brace dispensed 4.  Continue wearing  good supportive shoes 5.  Prescription for Medrol Dosepak  6.  Continue diclofenac after completion of the Medrol Dosepak 7.  Return to clinic in 4 weeks     Felecia Shelling, DPM [facility]  Dr. Felecia Shelling, DPM    [address] [place], [place] [address]                Office [phone]  Fax [phone]

## 2018-09-03 ENCOUNTER — Encounter: Payer: BLUE CROSS/BLUE SHIELD | Admitting: Podiatry

## 2018-09-09 NOTE — Progress Notes (Signed)
This encounter was created in error - please disregard.

## 2018-10-14 ENCOUNTER — Telehealth: Payer: Self-pay | Admitting: Podiatry

## 2018-10-14 NOTE — Telephone Encounter (Signed)
Patient is wanting a refill on diclofenac. Please call patient

## 2018-10-15 NOTE — Telephone Encounter (Signed)
Called patient, Left VM to call in and make appt to see Dr Amalia Hailey if foot is still painful and not getting any better.

## 2018-11-05 ENCOUNTER — Other Ambulatory Visit: Payer: Self-pay

## 2018-11-05 MED ORDER — DICLOFENAC SODIUM 75 MG PO TBEC: 75.0000 mg | DELAYED_RELEASE_TABLET | Freq: Two times a day (BID) | ORAL | 0 refills | Status: DC

## 2018-11-17 MED ORDER — DICLOFENAC SODIUM 75 MG PO TBEC: 75.0000 mg | DELAYED_RELEASE_TABLET | Freq: Two times a day (BID) | ORAL | 0 refills | Status: DC

## 2018-11-17 NOTE — Telephone Encounter (Signed)
Left message informing pt Dr. Amalia Hailey had refilled the diclofenac an antiinflammatory medication and that should get her to the 11/26/2018 3:45pm appt.

## 2018-11-17 NOTE — Telephone Encounter (Signed)
Patient calling again requesting a refill on her pain medication. Patient was told she needed to schedule an appt with the doctor if she is still having pain.  Pt is scheduled for an appt on 11/26/18 but would like to have some medication sent in to hold her until her appt date.

## 2018-11-17 NOTE — Addendum Note (Signed)
Addended by: Harriett Sine D on: 11/17/2018 02:45 PM   Modules accepted: Orders

## 2018-11-26 ENCOUNTER — Ambulatory Visit: Payer: BLUE CROSS/BLUE SHIELD | Admitting: Podiatry

## 2018-12-10 ENCOUNTER — Ambulatory Visit: Payer: BC Managed Care – PPO | Admitting: Podiatry

## 2018-12-10 ENCOUNTER — Other Ambulatory Visit: Payer: Self-pay | Admitting: Podiatry

## 2018-12-10 NOTE — Telephone Encounter (Signed)
Last refill 11/17/2018, note to pharmacy (pt must see physician for future refills)

## 2019-01-05 ENCOUNTER — Ambulatory Visit: Payer: BC Managed Care – PPO | Admitting: Podiatry

## 2019-01-28 ENCOUNTER — Other Ambulatory Visit: Payer: Self-pay

## 2019-01-28 ENCOUNTER — Telehealth (INDEPENDENT_AMBULATORY_CARE_PROVIDER_SITE_OTHER): Payer: BC Managed Care – PPO | Admitting: Family Medicine

## 2019-01-28 DIAGNOSIS — J069 Acute upper respiratory infection, unspecified: Secondary | ICD-10-CM

## 2019-01-28 MED ORDER — LORATADINE 10 MG PO TABS: 10.0000 mg | ORAL_TABLET | Freq: Every day | ORAL | 1 refills | Status: DC

## 2019-01-28 NOTE — Progress Notes (Signed)
[facility] Visit  Patient consented to have virtual visit. Method of visit: Telephone  Encounter participants: Patient: [name] - located at home Provider: Lind Covert - located at office Others (if applicable): no  Chief Complaint: Runny nose  HPI: Sick for 2days with runny nose nonproductive cough and mild fatigue.  No shortness of breath or fever or rash.  Has history of allergies not on any medications.  Did take alkaselzter which helped a little.    No known sick exposures.   ROS: per HPI  Pertinent PMHx: history of asthma as a child  Exam:  Respiratory: normal pattern   Assessment/Plan:  URI symptoms Likely viral or allergic.  No signs of significant pulmonary illness.  Discussed Covid testing - suggested she should get this if arround others who could get it.  Recommend [employer] test site.  Recommend clariten for possible allergies sent in Rx.  To call us if not better in 5 days or if worsening.  If any high fever or shortness of breath should go to ER    Time spent during visit with patient: 13 minutes

## 2019-03-11 ENCOUNTER — Other Ambulatory Visit: Payer: Self-pay | Admitting: Family Medicine

## 2019-03-11 ENCOUNTER — Other Ambulatory Visit: Payer: Self-pay | Admitting: *Deleted

## 2019-03-11 DIAGNOSIS — K219 Gastro-esophageal reflux disease without esophagitis: Secondary | ICD-10-CM

## 2019-03-12 MED ORDER — PANTOPRAZOLE SODIUM 20 MG PO TBEC: 20.0000 mg | DELAYED_RELEASE_TABLET | Freq: Every day | ORAL | 1 refills | Status: DC

## 2019-03-24 ENCOUNTER — Other Ambulatory Visit: Payer: Self-pay

## 2019-03-24 DIAGNOSIS — Z20822 Contact with and (suspected) exposure to covid-19: Secondary | ICD-10-CM

## 2019-03-26 ENCOUNTER — Telehealth: Payer: Self-pay

## 2019-03-26 NOTE — Telephone Encounter (Signed)
Caller advise that result are not back yet 

## 2019-03-27 ENCOUNTER — Telehealth: Payer: Self-pay | Admitting: *Deleted

## 2019-03-27 LAB — NOVEL CORONAVIRUS, NAA: SARS-CoV-2, NAA: NOT DETECTED

## 2019-03-27 NOTE — Telephone Encounter (Signed)
Patient called informed of negative covid results .

## 2019-04-14 ENCOUNTER — Ambulatory Visit: Payer: BC Managed Care – PPO | Attending: Internal Medicine

## 2019-04-14 DIAGNOSIS — Z20828 Contact with and (suspected) exposure to other viral communicable diseases: Secondary | ICD-10-CM | POA: Diagnosis not present

## 2019-04-14 DIAGNOSIS — Z20822 Contact with and (suspected) exposure to covid-19: Secondary | ICD-10-CM

## 2019-04-15 LAB — NOVEL CORONAVIRUS, NAA: SARS-CoV-2, NAA: NOT DETECTED

## 2019-04-18 ENCOUNTER — Telehealth: Payer: Self-pay

## 2019-04-18 NOTE — Telephone Encounter (Signed)
Called and informed patient that test for Covid 19 was NEGATIVE. Discussed signs and symptoms of Covid 19 : fever, chills, respiratory symptoms, cough, ENT symptoms, sore throat, SOB, muscle pain, diarrhea, headache, loss of taste/smell, close exposure to COVID-19 patient. Pt instructed to call PCP if they develop the above signs and sx. Pt also instructed to call 911 if having respiratory issues/distress.  Pt verbalized understanding.  

## 2019-05-26 ENCOUNTER — Ambulatory Visit: Payer: BC Managed Care – PPO | Attending: Internal Medicine

## 2019-05-26 DIAGNOSIS — Z20822 Contact with and (suspected) exposure to covid-19: Secondary | ICD-10-CM

## 2019-05-27 LAB — NOVEL CORONAVIRUS, NAA: SARS-CoV-2, NAA: NOT DETECTED

## 2019-07-08 ENCOUNTER — Ambulatory Visit: Payer: BC Managed Care – PPO | Attending: Internal Medicine

## 2019-07-08 DIAGNOSIS — Z20822 Contact with and (suspected) exposure to covid-19: Secondary | ICD-10-CM

## 2019-07-09 LAB — NOVEL CORONAVIRUS, NAA: SARS-CoV-2, NAA: NOT DETECTED

## 2019-07-09 LAB — SARS-COV-2, NAA 2 DAY TAT

## 2019-07-20 ENCOUNTER — Encounter: Payer: Self-pay | Admitting: Podiatry

## 2019-07-20 ENCOUNTER — Ambulatory Visit (INDEPENDENT_AMBULATORY_CARE_PROVIDER_SITE_OTHER): Payer: BC Managed Care – PPO

## 2019-07-20 ENCOUNTER — Other Ambulatory Visit: Payer: Self-pay

## 2019-07-20 ENCOUNTER — Ambulatory Visit (INDEPENDENT_AMBULATORY_CARE_PROVIDER_SITE_OTHER): Payer: BC Managed Care – PPO | Admitting: Podiatry

## 2019-07-20 VITALS — Temp 98.1°F

## 2019-07-20 DIAGNOSIS — M659 Synovitis and tenosynovitis, unspecified: Secondary | ICD-10-CM

## 2019-07-20 DIAGNOSIS — M722 Plantar fascial fibromatosis: Secondary | ICD-10-CM | POA: Diagnosis not present

## 2019-07-20 MED ORDER — DICLOFENAC SODIUM 75 MG PO TBEC: 75.0000 mg | DELAYED_RELEASE_TABLET | Freq: Two times a day (BID) | ORAL | 1 refills | Status: DC

## 2019-07-20 MED ORDER — METHYLPREDNISOLONE 4 MG PO TBPK: ORAL_TABLET | ORAL | 0 refills | Status: DC

## 2019-07-21 ENCOUNTER — Other Ambulatory Visit (HOSPITAL_COMMUNITY)
Admission: RE | Admit: 2019-07-21 | Discharge: 2019-07-21 | Disposition: A | Discharge status: Home / Self Care | Payer: BC Managed Care – PPO | Source: Ambulatory Visit | Attending: Family Medicine | Admitting: Family Medicine

## 2019-07-21 ENCOUNTER — Encounter: Payer: Self-pay | Admitting: Family Medicine

## 2019-07-21 ENCOUNTER — Ambulatory Visit (INDEPENDENT_AMBULATORY_CARE_PROVIDER_SITE_OTHER): Payer: BC Managed Care – PPO | Admitting: Family Medicine

## 2019-07-21 VITALS — BP 132/80 | HR 94 | Ht 66.5 in | Wt 384.8 lb

## 2019-07-21 DIAGNOSIS — Z124 Encounter for screening for malignant neoplasm of cervix: Secondary | ICD-10-CM

## 2019-07-21 DIAGNOSIS — Z6841 Body Mass Index (BMI) 40.0 and over, adult: Secondary | ICD-10-CM

## 2019-07-21 NOTE — Progress Notes (Signed)
    SUBJECTIVE:   CHIEF COMPLAINT / HPI: Need for Pap smear  Patient denies any symptoms just says that she is here for her regularly scheduled Pap smear.  She has no complaints beyond chronic foot pain that she is currently seeing [facility] for and weight control.  She said due to the stress and schedule change of Covid as well as her foot pain she has been gaining weight (BMI to 61 from 50)  PERTINENT  PMH / PSH: Obesity  OBJECTIVE:   BP 132/80   Pulse 94   Ht 5' 6.5" (1.689 m)   Wt (!) 384 lb 12.8 oz (174.5 kg)   LMP  (LMP Unknown)   SpO2 98%   BMI 61.18 kg/m   General: No distress, pleasant and alert Respiratory: No increased work of breathing, no cough Cardiac: Regular rate GU exam: *Performed entirely with RN Dahlia Client in the room*no pathologic external lesions visualized, no physiologically abnormal discharge and no bleeding on Pap.  ASSESSMENT/PLAN:   Pap smear for cervical cancer screening No symptoms, exam done with RN Dahlia Client in the room, no pathology visualized  Results pending  Obesity Up to BMI over 60 from prior at 50.  Patient refused bariatric referral, refused therapy referral, did accept nutrition referral which has been placed.     Marthenia Rolling, DO [facility]

## 2019-07-22 LAB — LIPID PANEL
Chol/HDL Ratio: 2.6 ratio (ref 0.0–4.4)
Cholesterol, Total: 120 mg/dL (ref 100–199)
HDL: 46 mg/dL
LDL Chol Calc (NIH): 57 mg/dL (ref 0–99)
Triglycerides: 88 mg/dL (ref 0–149)
VLDL Cholesterol Cal: 17 mg/dL (ref 5–40)

## 2019-07-22 LAB — COMPREHENSIVE METABOLIC PANEL WITH GFR
ALT: 15 [IU]/L (ref 0–32)
AST: 19 [IU]/L (ref 0–40)
Albumin/Globulin Ratio: 1.4 (ref 1.2–2.2)
Albumin: 4.2 g/dL (ref 3.8–4.8)
Alkaline Phosphatase: 57 [IU]/L (ref 39–117)
BUN/Creatinine Ratio: 14 (ref 9–23)
BUN: 12 mg/dL (ref 6–20)
Bilirubin Total: 0.4 mg/dL (ref 0.0–1.2)
CO2: 25 mmol/L (ref 20–29)
Calcium: 9.1 mg/dL (ref 8.7–10.2)
Chloride: 104 mmol/L (ref 96–106)
Creatinine, Ser: 0.83 mg/dL (ref 0.57–1.00)
GFR calc Af Amer: 107 mL/min/{1.73_m2}
GFR calc non Af Amer: 93 mL/min/{1.73_m2}
Globulin, Total: 2.9 g/dL (ref 1.5–4.5)
Glucose: 86 mg/dL (ref 65–99)
Potassium: 3.8 mmol/L (ref 3.5–5.2)
Sodium: 140 mmol/L (ref 134–144)
Total Protein: 7.1 g/dL (ref 6.0–8.5)

## 2019-07-23 NOTE — Assessment & Plan Note (Signed)
Up to BMI over 60 from prior at 50.  Patient refused bariatric referral, refused therapy referral, did accept nutrition referral which has been placed.

## 2019-07-23 NOTE — Assessment & Plan Note (Signed)
No symptoms, exam done with RN Dahlia Client in the room, no pathology visualized  Results pending

## 2019-07-23 NOTE — Progress Notes (Signed)
   Subjective: 34 y.o. female presenting today for follow up evaluation of left foot and ankle pain. She reports continued pain that has not changed since her last visit. She reports the injections and oral medication helped alleviate the pain temporarily. Walking and being on the foot for long periods of time increases the symptoms. Patient is here for further evaluation and treatment.   Past Medical History:  Diagnosis Date  . Ankle ulcer (HCC)    right  . Childhood asthma   . Chronic lower back pain   . GERD (gastroesophageal reflux disease)   . Headache    "only when I'm hungry" (10/14/2015)  . Hypertension   . Obesity   . Varicose veins   . VSD (ventricular septal defect)    "had hole in bottom of her heart; closed up on it's own; no OR" /mom 10/14/2015)     Objective: Physical Exam General: The patient is alert and oriented x3 in no acute distress.  Dermatology: Skin is warm, dry and supple bilateral lower extremities. Negative for open lesions or macerations bilateral.   Vascular: Dorsalis Pedis and Posterior Tibial pulses palpable bilateral.  Capillary fill time is immediate to all digits.  Neurological: Epicritic and protective threshold intact bilateral.   Musculoskeletal: Range of motion within normal limits to all pedal and ankle joints bilateral. Muscle strength 5/5 in all groups bilateral.  Tenderness to palpation of the medial calcaneal tubercle of the left foot at the insertion of the plantar fascia.  Pain on palpation also noted to the lateral aspect of the left ankle joint.  Radiographic Exam:  Normal osseous mineralization. Joint spaces preserved. No fracture/dislocation/boney destruction.    Assessment: 1. Synovitis left ankle 2. Chronic plantar fasciitis left   Plan of Care:  1. Patient evaluated. Xrays reviewed.   2. Declined injections.   3. Rx for Medrol Dose Pak placed 4. Refill prescription for Diclofenac ordered for patient. 5. Ankle brace  dispensed.  6. Return to clinic as needed.   Art therapist at [employer].      Felecia Shelling, DPM [facility]  Dr. Felecia Shelling, DPM    [address] [place], [place] [address]                Office [phone]  Fax [phone]

## 2019-07-28 ENCOUNTER — Telehealth: Payer: Self-pay | Admitting: Family Medicine

## 2019-07-28 DIAGNOSIS — A5901 Trichomonal vulvovaginitis: Secondary | ICD-10-CM

## 2019-07-28 LAB — CYTOLOGY - PAP
Chlamydia: NEGATIVE
Comment: NEGATIVE
Comment: NEGATIVE
Comment: NEGATIVE
Comment: NEGATIVE
Comment: NORMAL
Diagnosis: NEGATIVE
HSV1: NEGATIVE
HSV2: NEGATIVE
High risk HPV: NEGATIVE
Neisseria Gonorrhea: NEGATIVE
Trichomonas: POSITIVE — AB

## 2019-07-28 MED ORDER — METRONIDAZOLE 500 MG PO TABS: 2000.0000 mg | ORAL_TABLET | Freq: Once | ORAL | 0 refills | Status: AC

## 2019-07-28 NOTE — Telephone Encounter (Signed)
Try to call patient to discuss results of testing, she did not pick up a note message for her to call us back.  This message can be read to her" Dr. Parke Simmers wanted you to know that the testing came back showing there was positive for trichomonas.  This is a sexual transmitted disease that is very treatable with 1 dose of metronidazole which she will send to the pharmacy for you.  Please abstain from sexual activity for 1 week after treatment, please have your partner notified so that they can also seek treatment.  Please avoid drinking alcohol for the next week."  Dr. Parke Simmers

## 2019-08-27 ENCOUNTER — Ambulatory Visit: Payer: BC Managed Care – PPO | Admitting: Dietician

## 2019-11-20 ENCOUNTER — Ambulatory Visit (HOSPITAL_COMMUNITY)
Admission: EM | Admit: 2019-11-20 | Discharge: 2019-11-20 | Disposition: A | Discharge status: Home / Self Care | Payer: BC Managed Care – PPO | Attending: Family Medicine | Admitting: Family Medicine

## 2019-11-20 ENCOUNTER — Encounter (HOSPITAL_COMMUNITY): Payer: Self-pay

## 2019-11-20 ENCOUNTER — Other Ambulatory Visit: Payer: Self-pay

## 2019-11-20 DIAGNOSIS — K219 Gastro-esophageal reflux disease without esophagitis: Secondary | ICD-10-CM | POA: Diagnosis not present

## 2019-11-20 DIAGNOSIS — Z8249 Family history of ischemic heart disease and other diseases of the circulatory system: Secondary | ICD-10-CM | POA: Insufficient documentation

## 2019-11-20 DIAGNOSIS — Z888 Allergy status to other drugs, medicaments and biological substances status: Secondary | ICD-10-CM | POA: Diagnosis not present

## 2019-11-20 DIAGNOSIS — Z6841 Body Mass Index (BMI) 40.0 and over, adult: Secondary | ICD-10-CM | POA: Diagnosis not present

## 2019-11-20 DIAGNOSIS — E669 Obesity, unspecified: Secondary | ICD-10-CM | POA: Insufficient documentation

## 2019-11-20 DIAGNOSIS — U071 COVID-19: Secondary | ICD-10-CM | POA: Insufficient documentation

## 2019-11-20 DIAGNOSIS — I10 Essential (primary) hypertension: Secondary | ICD-10-CM | POA: Diagnosis not present

## 2019-11-20 DIAGNOSIS — Z79899 Other long term (current) drug therapy: Secondary | ICD-10-CM | POA: Diagnosis not present

## 2019-11-20 DIAGNOSIS — J069 Acute upper respiratory infection, unspecified: Secondary | ICD-10-CM

## 2019-11-20 DIAGNOSIS — R05 Cough: Secondary | ICD-10-CM | POA: Diagnosis present

## 2019-11-20 MED ORDER — FLUTICASONE PROPIONATE 50 MCG/ACT NA SUSP: 1.0000 | Freq: Every day | NASAL | 0 refills | Status: DC

## 2019-11-20 MED ORDER — IBUPROFEN 800 MG PO TABS: 800.0000 mg | ORAL_TABLET | Freq: Three times a day (TID) | ORAL | 0 refills | Status: DC

## 2019-11-20 MED ORDER — BENZONATATE 200 MG PO CAPS: 200.0000 mg | ORAL_CAPSULE | Freq: Three times a day (TID) | ORAL | 0 refills | Status: AC | PRN

## 2019-11-20 MED ORDER — DM-GUAIFENESIN ER 30-600 MG PO TB12: 1.0000 | ORAL_TABLET | Freq: Two times a day (BID) | ORAL | 0 refills | Status: DC

## 2019-11-20 NOTE — ED Triage Notes (Addendum)
Pt is here with a cough, nasal congestion and feeling fatigue for 3 days now, pt has taken Alka Seltzer to relieve discomfort.

## 2019-11-20 NOTE — Discharge Instructions (Signed)
Covid test pending, monitor my chart for results Flonase nasal spray 1 to 2 spray in each nostril daily for sinus congestion Mucinex DM twice daily to further help relieve congestion/cough Benzonatate/Tessalon every 8 hours as needed for cough Ibuprofen and Tylenol for body aches, fever Rest and fluids Follow-up if not improving or worsening

## 2019-11-21 LAB — SARS CORONAVIRUS 2 (TAT 6-24 HRS): SARS Coronavirus 2: POSITIVE — AB

## 2019-11-21 NOTE — ED Provider Notes (Signed)
[facility]    CSN: [id number] Arrival date & time: 11/20/19  1709      History   Chief Complaint Chief Complaint  Patient presents with  . Nasal Congestion  . Cough  . Fatigue    HPI [name] is a 34 y.o. female presenting today for evaluation of URI symptoms. Patient reports over the past 3 days she has had a cough congestion and feeling fatigued. Using Alka-Seltzer without relief. Denies chest pain or shortness of breath. Denies prior Covid infection/ vaccination.  HPI  Past Medical History:  Diagnosis Date  . Ankle ulcer (HCC)    right  . Childhood asthma   . Chronic lower back pain   . GERD (gastroesophageal reflux disease)   . Headache    "only when I'm hungry" (10/14/2015)  . Hypertension   . Obesity   . Varicose veins   . VSD (ventricular septal defect)    "had hole in bottom of her heart; closed up on it's own; no OR" /mom 10/14/2015)    Patient Active Problem List   Diagnosis Date Noted  . Pap smear for cervical cancer screening 01/06/2018  . Gastroesophageal reflux disease 01/06/2018  . Candida vaginitis 01/06/2018  . Dizziness 06/06/2017  . Leg edema 12/12/2016  . Orthopnea 12/12/2016  . Snoring 12/12/2016  . Plantar fasciitis 12/12/2016  . Umbilical hernia 10/14/2015  . Epigastric abdominal pain 03/01/2015  . 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 Low back pain 04/23/2014  . Possible exposure to STD 01/25/2014  . Birth control counseling 01/25/2014  . Essential hypertension, benign 11/16/2013  . Well woman exam 11/16/2013  . Elevated BP 10/08/2013  . Menorrhagia 10/08/2013  . History of iritis 10/08/2013  . Varicose veins of lower extremities with other complications 10/25/2011  . Obesity 10/25/2011    Past Surgical History:  Procedure Laterality Date  . HERNIA REPAIR    . INSERTION OF MESH N/A 10/14/2015   Procedure: INSERTION OF MESH;  Surgeon: Avel Peace, MD;  Location: [facility]  OR;  Service: General;  Laterality: N/A;  . LAPAROSCOPIC INCISIONAL / UMBILICAL / VENTRAL HERNIA REPAIR  10/14/2015   VHR w/mesh  . TONSILLECTOMY AND ADENOIDECTOMY  1990s  . TYMPANOSTOMY TUBE PLACEMENT Bilateral 1990s  . VEIN LIGATION AND STRIPPING Right 12/15/2014   Procedure: SAPHENOUS VEIN LIGATION AND STRIPPING; GREATER THAN 20 STAB PHLEBECTOMIES;  Surgeon: Larina Earthly, MD;  Location[facility] OR;  Service: Vascular;  Laterality: Right;  . VENTRAL HERNIA REPAIR N/A 10/14/2015   Procedure: LAPAROSCOPIC VENTRAL HERNIA;  Surgeon: Avel Peace, MD;  Location: [facility] OR;  Service: General;  Laterality: N/A;    OB History   No obstetric history on file.      Home Medications    Prior to Admission medications   Medication Sig Start Date End Date Taking? Authorizing Provider  benzonatate (TESSALON) 200 MG capsule Take 1 capsule (200 mg total) by mouth 3 (three) times daily as needed for up to 7 days for cough. 11/20/19 11/27/19  Jarrett Albor C, PA-C  dextromethorphan-guaiFENesin (MUCINEX DM) 30-600 MG 12hr tablet Take 1 tablet by mouth 2 (two) times daily. 11/20/19   Darnita Woodrum C, PA-C  fluticasone (FLONASE) 50 MCG/ACT nasal spray Place 1-2 sprays into both nostrils daily for 7 days. 11/20/19 11/27/19  Nolah Krenzer C, PA-C  ibuprofen (ADVIL) 800 MG tablet Take 1 tablet (800 mg total) by mouth 3 (three) times daily. 11/20/19   Sharyon Cable, [employer]  C, PA-C  loratadine (CLARITIN) 10 MG tablet Take 1 tablet (10 mg total) by mouth daily. 01/28/19   Carney Living, MD  methylPREDNISolone (MEDROL DOSEPAK) 4 MG TBPK tablet 6 day dose pack - take as directed 07/20/19   Felecia Shelling, DPM  Multiple Vitamin (MULTIVITAMIN) tablet Take 2 tablets by mouth daily. Vitafusion gummies    [provider]  pantoprazole (PROTONIX) 20 MG tablet Take 1 tablet (20 mg total) by mouth daily. 03/12/19 11/20/19  Marthenia Rolling, DO    Family History Family History  Problem Relation Age of Onset  . Diabetes Mother     . Varicose Veins Mother   . Hypertension Mother   . Hyperlipidemia Mother   . Healthy Father   . Varicose Veins Sister   . Cancer Sister        unsure of type but requiring hysterectomy    Social History Social History   Tobacco Use  . Smoking status: Never Smoker  . Smokeless tobacco: Never Used  Vaping Use  . Vaping Use: Never used  Substance Use Topics  . Alcohol use: Yes    Comment:  socially  . Drug use: Yes    Types: Marijuana     Allergies   Tramadol   Review of Systems Review of Systems  Constitutional: Positive for fatigue. Negative for activity change, appetite change, chills and fever.  HENT: Positive for congestion, rhinorrhea and sinus pressure. Negative for ear pain, sore throat and trouble swallowing.   Eyes: Negative for discharge and redness.  Respiratory: Positive for cough. Negative for chest tightness and shortness of breath.   Cardiovascular: Negative for chest pain.  Gastrointestinal: Negative for abdominal pain, diarrhea, nausea and vomiting.  Musculoskeletal: Negative for myalgias.  Skin: Negative for rash.  Neurological: Negative for dizziness, light-headedness and headaches.     Physical Exam Triage Vital Signs ED Triage Vitals  Enc Vitals Group     BP 11/20/19 1736 116/88     Pulse Rate 11/20/19 1736 86     Resp 11/20/19 1736 (!) 21     Temp 11/20/19 1736 99.8 F (37.7 C)     Temp Source 11/20/19 1736 Oral     SpO2 11/20/19 1736 99 %     Weight 11/20/19 1734 (!) 380 lb (172.4 kg)     Height --      Head Circumference --      Peak Flow --      Pain Score 11/20/19 1734 0     Pain Loc --      Pain Edu? --      Excl. in GC? --    No data found.  Updated Vital Signs BP 116/88 (BP Location: Right Arm)   Pulse 86   Temp 99.8 F (37.7 C) (Oral)   Resp (!) 21   Wt (!) 380 lb (172.4 kg)   LMP 11/06/2019   SpO2 99%   BMI 60.41 kg/m   Visual Acuity Right Eye Distance:   Left Eye Distance:   Bilateral Distance:    Right  Eye Near:   Left Eye Near:    Bilateral Near:     Physical Exam Vitals and nursing note reviewed.  Constitutional:      Appearance: She is well-developed.     Comments: No acute distress  HENT:     Head: Normocephalic and atraumatic.     Ears:     Comments: Bilateral ears without tenderness to palpation of external auricle, tragus and mastoid,  EAC's without erythema or swelling, TM's with good bony landmarks and cone of light. Non erythematous.     Nose: Nose normal.     Mouth/Throat:     Comments: Oral mucosa pink and moist, no tonsillar enlargement or exudate. Posterior pharynx patent and nonerythematous, no uvula deviation or swelling. Normal phonation. Eyes:     Conjunctiva/sclera: Conjunctivae normal.  Cardiovascular:     Rate and Rhythm: Normal rate.  Pulmonary:     Effort: Pulmonary effort is normal. No respiratory distress.     Comments: Breathing comfortably at rest, CTABL, no wheezing, rales or other adventitious sounds auscultated Abdominal:     General: There is no distension.  Musculoskeletal:        General: Normal range of motion.     Cervical back: Neck supple.  Skin:    General: Skin is warm and dry.  Neurological:     Mental Status: She is alert and oriented to person, place, and time.      UC Treatments / Results  Labs (all labs ordered are listed, but only abnormal results are displayed) Labs Reviewed  SARS CORONAVIRUS 2 (TAT 6-24 HRS) - Abnormal; Notable for the following components:      Result Value   SARS Coronavirus 2 POSITIVE (*)    All other components within normal limits    EKG   Radiology No results found.  Procedures Procedures (including critical care time)  Medications Ordered in UC Medications - No data to display  Initial Impression / Assessment and Plan / UC Course  I have reviewed the triage vital signs and the nursing notes.  Pertinent labs & imaging results that were available during my care of the patient were  reviewed by me and considered in my medical decision making (see chart for details).     URI symptoms x3 days, Covid test pending. Recommending symptomatic and supportive care, rest and fluids. Currently stable, lungs clear,Discussed strict return precautions. Patient verbalized understanding and is agreeable with plan.  Final Clinical Impressions(s) / UC Diagnoses   Final diagnoses:  Viral URI with cough     Discharge Instructions     Covid test pending, monitor my chart for results Flonase nasal spray 1 to 2 spray in each nostril daily for sinus congestion Mucinex DM twice daily to further help relieve congestion/cough Benzonatate/Tessalon every 8 hours as needed for cough Ibuprofen and Tylenol for body aches, fever Rest and fluids Follow-up if not improving or worsening   ED Prescriptions    Medication Sig Dispense Auth. Provider   fluticasone (FLONASE) 50 MCG/ACT nasal spray Place 1-2 sprays into both nostrils daily for 7 days. 1 g Denelda Akerley C, PA-C   dextromethorphan-guaiFENesin (MUCINEX DM) 30-600 MG 12hr tablet Take 1 tablet by mouth 2 (two) times daily. 20 tablet Giovonni Poirier C, PA-C   benzonatate (TESSALON) 200 MG capsule Take 1 capsule (200 mg total) by mouth 3 (three) times daily as needed for up to 7 days for cough. 28 capsule Murrel Freet C, PA-C   ibuprofen (ADVIL) 800 MG tablet Take 1 tablet (800 mg total) by mouth 3 (three) times daily. 21 tablet Koen Antilla, [place] C, PA-C     PDMP not reviewed this encounter.   Lew Dawes, PA-C 11/21/19 1007

## 2019-11-22 ENCOUNTER — Other Ambulatory Visit: Payer: Self-pay | Admitting: Adult Health

## 2019-11-22 DIAGNOSIS — U071 COVID-19: Secondary | ICD-10-CM

## 2019-11-22 NOTE — Progress Notes (Signed)
I connected by phone with [name] on 11/22/2019 at 7:52 AM to discuss the potential use of a new treatment for mild to moderate COVID-19 viral infection in non-hospitalized patients.  She was unaware that she was positive and we reviewed quarantine criteria as well.  She was shocked and tearful to learn that she is positive for COVID 19.    This patient is a 34 y.o. female that meets the FDA criteria for Emergency Use Authorization of COVID monoclonal antibody casirivimab/imdevimab.  Has a (+) direct SARS-CoV-2 viral test result  Has mild or moderate COVID-19   Is NOT hospitalized due to COVID-19  Is within 10 days of symptom onset  Has at least one of the high risk factor(s) for progression to severe COVID-19 and/or hospitalization as defined in EUA.  Specific high risk criteria : BMI > 25   I have spoken and communicated the following to the patient or parent/caregiver regarding COVID monoclonal antibody treatment:  1. FDA has authorized the emergency use for the treatment of mild to moderate COVID-19 in adults and pediatric patients with positive results of direct SARS-CoV-2 viral testing who are 29 years of age and older weighing at least 40 kg, and who are at high risk for progressing to severe COVID-19 and/or hospitalization.  2. The significant known and potential risks and benefits of COVID monoclonal antibody, and the extent to which such potential risks and benefits are unknown.  3. Information on available alternative treatments and the risks and benefits of those alternatives, including clinical trials.  4. Patients treated with COVID monoclonal antibody should continue to self-isolate and use infection control measures (e.g., wear mask, isolate, social distance, avoid sharing personal items, clean and disinfect "high touch" surfaces, and frequent handwashing) according to CDC guidelines.   5. The patient or parent/caregiver has the option to accept or refuse COVID  monoclonal antibody treatment.  After reviewing this information with the patient, The patient agreed to proceed with receiving casirivimab\imdevimab infusion and will be provided a copy of the Fact sheet prior to receiving the infusion. Noreene Filbert 11/22/2019 7:52 AM

## 2019-11-23 MED ORDER — SODIUM CHLORIDE 0.9 % IV SOLN
1200.0000 mg | Freq: Once | INTRAVENOUS | Status: AC
  Administered 2019-11-24: 1200 mg via INTRAVENOUS
  Filled 2019-11-23: qty 10
  Filled 2019-11-23: qty 600

## 2019-11-24 ENCOUNTER — Other Ambulatory Visit: Payer: BC Managed Care – PPO

## 2019-11-24 ENCOUNTER — Ambulatory Visit (HOSPITAL_COMMUNITY)
Admission: RE | Admit: 2019-11-24 | Discharge: 2019-11-24 | Disposition: A | Discharge status: Home / Self Care | Payer: BC Managed Care – PPO | Source: Ambulatory Visit | Attending: Pulmonary Disease | Admitting: Pulmonary Disease

## 2019-11-24 DIAGNOSIS — U071 COVID-19: Secondary | ICD-10-CM

## 2019-11-24 MED ORDER — SODIUM CHLORIDE 0.9 % IV BOLUS
500.0000 mL | Freq: Once | INTRAVENOUS | Status: AC
  Administered 2019-11-24: 500 mL via INTRAVENOUS

## 2019-11-24 MED ORDER — FAMOTIDINE IN NACL 20-0.9 MG/50ML-% IV SOLN: 20.0000 mg | Freq: Once | INTRAVENOUS | Status: DC | PRN

## 2019-11-24 MED ORDER — ONDANSETRON HCL 4 MG/2ML IJ SOLN
4.0000 mg | Freq: Once | INTRAMUSCULAR | Status: AC
  Administered 2019-11-24: 4 mg via INTRAVENOUS
  Filled 2019-11-24: qty 2

## 2019-11-24 MED ORDER — DIPHENHYDRAMINE HCL 50 MG/ML IJ SOLN: 50.0000 mg | Freq: Once | INTRAMUSCULAR | Status: DC | PRN

## 2019-11-24 MED ORDER — ACETAMINOPHEN 325 MG PO TABS
650.0000 mg | ORAL_TABLET | Freq: Once | ORAL | Status: AC
  Administered 2019-11-24: 650 mg via ORAL
  Filled 2019-11-24: qty 2

## 2019-11-24 MED ORDER — ALBUTEROL SULFATE HFA 108 (90 BASE) MCG/ACT IN AERS: 2.0000 | INHALATION_SPRAY | Freq: Once | RESPIRATORY_TRACT | Status: DC | PRN

## 2019-11-24 MED ORDER — METHYLPREDNISOLONE SODIUM SUCC 125 MG IJ SOLR: 125.0000 mg | Freq: Once | INTRAMUSCULAR | Status: DC | PRN

## 2019-11-24 MED ORDER — SODIUM CHLORIDE 0.9 % IV SOLN: INTRAVENOUS | Status: DC | PRN

## 2019-11-24 MED ORDER — EPINEPHRINE 0.3 MG/0.3ML IJ SOAJ: 0.3000 mg | Freq: Once | INTRAMUSCULAR | Status: DC | PRN

## 2019-11-24 NOTE — Progress Notes (Signed)
  Diagnosis: COVID-19  Physician: Dr. Patrick Wright  Procedure: Covid Infusion Clinic Med: casirivimab\imdevimab infusion - Provided patient with casirivimab\imdevimab fact sheet for patients, parents and caregivers prior to infusion.  Complications: No immediate complications noted.  Discharge: Discharged home   Unity Luepke S Richie Bonanno 11/24/2019  

## 2019-11-24 NOTE — Discharge Instructions (Signed)

## 2019-11-26 ENCOUNTER — Other Ambulatory Visit: Payer: Self-pay

## 2019-11-26 ENCOUNTER — Encounter (HOSPITAL_COMMUNITY): Payer: Self-pay | Admitting: Obstetrics and Gynecology

## 2019-11-26 ENCOUNTER — Emergency Department (HOSPITAL_COMMUNITY)
Admission: EM | Admit: 2019-11-26 | Discharge: 2019-11-26 | Disposition: A | Discharge status: Home / Self Care | Payer: BC Managed Care – PPO | Attending: Emergency Medicine | Admitting: Emergency Medicine

## 2019-11-26 DIAGNOSIS — I10 Essential (primary) hypertension: Secondary | ICD-10-CM | POA: Diagnosis not present

## 2019-11-26 DIAGNOSIS — Y939 Activity, unspecified: Secondary | ICD-10-CM | POA: Insufficient documentation

## 2019-11-26 DIAGNOSIS — J45909 Unspecified asthma, uncomplicated: Secondary | ICD-10-CM | POA: Diagnosis not present

## 2019-11-26 DIAGNOSIS — Y999 Unspecified external cause status: Secondary | ICD-10-CM | POA: Diagnosis not present

## 2019-11-26 DIAGNOSIS — S20122A Blister (nonthermal) of breast, left breast, initial encounter: Secondary | ICD-10-CM

## 2019-11-26 DIAGNOSIS — F1590 Other stimulant use, unspecified, uncomplicated: Secondary | ICD-10-CM | POA: Diagnosis not present

## 2019-11-26 DIAGNOSIS — L089 Local infection of the skin and subcutaneous tissue, unspecified: Secondary | ICD-10-CM | POA: Insufficient documentation

## 2019-11-26 DIAGNOSIS — U071 COVID-19: Secondary | ICD-10-CM | POA: Diagnosis not present

## 2019-11-26 DIAGNOSIS — M791 Myalgia, unspecified site: Secondary | ICD-10-CM | POA: Insufficient documentation

## 2019-11-26 DIAGNOSIS — R21 Rash and other nonspecific skin eruption: Secondary | ICD-10-CM | POA: Diagnosis not present

## 2019-11-26 DIAGNOSIS — Y929 Unspecified place or not applicable: Secondary | ICD-10-CM | POA: Diagnosis not present

## 2019-11-26 DIAGNOSIS — X58XXXA Exposure to other specified factors, initial encounter: Secondary | ICD-10-CM | POA: Insufficient documentation

## 2019-11-26 DIAGNOSIS — S20121A Blister (nonthermal) of breast, right breast, initial encounter: Secondary | ICD-10-CM | POA: Insufficient documentation

## 2019-11-26 LAB — BASIC METABOLIC PANEL
Anion gap: 10 (ref 5–15)
BUN: 15 mg/dL (ref 6–20)
CO2: 25 mmol/L (ref 22–32)
Calcium: 8.4 mg/dL — ABNORMAL LOW (ref 8.9–10.3)
Chloride: 102 mmol/L (ref 98–111)
Creatinine, Ser: 0.87 mg/dL (ref 0.44–1.00)
GFR calc Af Amer: >60 mL/min (ref >60)
GFR calc non Af Amer: >60 mL/min (ref >60)
Glucose, Bld: 114 mg/dL — ABNORMAL HIGH (ref 70–99)
Potassium: 3.8 mmol/L (ref 3.5–5.1)
Sodium: 137 mmol/L (ref 135–145)

## 2019-11-26 NOTE — ED Provider Notes (Signed)
[facility]-EMERGENCY DEPT Provider Note   CSN: [id number] Arrival date & time: 11/26/19  1437     History Chief Complaint  Patient presents with  . Recurrent Skin Infections  . COVID Positive    [name] is a 34 y.o. female.  HPI   Patient presents to the emergency department with chief complaint of boils that are on her breasts.  She states that she first noticed them this morning, she has 1 blister on each breast, she denies itchiness, burning sensation, or diffuse rash all over her body, no swelling of the tongue or throat or difficulty breathing.  Patient did state she recently had her infusion for Covid on Wednesday and she woke up today noticing these blisters.  She denies any alleviating or aggravating factors.  She has significant medical history of GERD, obesity, hypertension.  She denies headache, fever, chills, shortness of breath, chest pain, abdominal pain, dysuria, pedal edema.  Past Medical History:  Diagnosis Date  . Ankle ulcer (HCC)    right  . Childhood asthma   . Chronic lower back pain   . GERD (gastroesophageal reflux disease)   . Headache    "only when I'm hungry" (10/14/2015)  . Hypertension   . Obesity   . Varicose veins   . VSD (ventricular septal defect)    "had hole in bottom of her heart; closed up on it's own; no OR" /mom 10/14/2015)    Patient Active Problem List   Diagnosis Date Noted  . Pap smear for cervical cancer screening 01/06/2018  . Gastroesophageal reflux disease 01/06/2018  . Candida vaginitis 01/06/2018  . Dizziness 06/06/2017  . Leg edema 12/12/2016  . Orthopnea 12/12/2016  . Snoring 12/12/2016  . Plantar fasciitis 12/12/2016  . Umbilical hernia 10/14/2015  . Epigastric abdominal pain 03/01/2015  . 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 Low back pain 04/23/2014  . Possible exposure to STD 01/25/2014  . Birth control counseling 01/25/2014  . Essential  hypertension, benign 11/16/2013  . Well woman exam 11/16/2013  . Elevated BP 10/08/2013  . Menorrhagia 10/08/2013  . History of iritis 10/08/2013  . Varicose veins of lower extremities with other complications 10/25/2011  . Obesity 10/25/2011    Past Surgical History:  Procedure Laterality Date  . HERNIA REPAIR    . INSERTION OF MESH N/A 10/14/2015   Procedure: INSERTION OF MESH;  Surgeon: Avel Peaceodd Rosenbower, MD;  [facility];  Service: General;  Laterality: N/A;  . LAPAROSCOPIC INCISIONAL / UMBILICAL / VENTRAL HERNIA REPAIR  10/14/2015   VHR w/mesh  . TONSILLECTOMY AND ADENOIDECTOMY  1990s  . TYMPANOSTOMY TUBE PLACEMENT Bilateral 1990s  . VEIN LIGATION AND STRIPPING Right 12/15/2014   Procedure: SAPHENOUS VEIN LIGATION AND STRIPPING; GREATER THAN 20 STAB PHLEBECTOMIES;  Surgeon: Larina Earthlyodd F Early, MD;  [facility];  Service: Vascular;  Laterality: Right;  . VENTRAL HERNIA REPAIR N/A 10/14/2015   Procedure: LAPAROSCOPIC VENTRAL HERNIA;  Surgeon: Avel Peaceodd Rosenbower, MD;  [facility];  Service: General;  Laterality: N/A;     OB History   No obstetric history on file.     Family History  Problem Relation Age of Onset  . Diabetes Mother   . Varicose Veins Mother   . Hypertension Mother   . Hyperlipidemia Mother   . Healthy Father   . Varicose Veins Sister   . Cancer Sister        unsure of type but requiring  hysterectomy    Social History   Tobacco Use  . Smoking status: Never Smoker  . Smokeless tobacco: Never Used  Vaping Use  . Vaping Use: Never used  Substance Use Topics  . Alcohol use: Yes    Comment:  socially  . Drug use: Yes    Types: Marijuana    Home Medications Prior to Admission medications   Medication Sig Start Date End Date Taking? Authorizing Provider  benzonatate (TESSALON) 200 MG capsule Take 1 capsule (200 mg total) by mouth 3 (three) times daily as needed for up to 7 days for cough. 11/20/19 11/27/19  Wieters, Hallie C, PA-C    dextromethorphan-guaiFENesin (MUCINEX DM) 30-600 MG 12hr tablet Take 1 tablet by mouth 2 (two) times daily. 11/20/19   Wieters, Hallie C, PA-C  fluticasone (FLONASE) 50 MCG/ACT nasal spray Place 1-2 sprays into both nostrils daily for 7 days. 11/20/19 11/27/19  Wieters, Hallie C, PA-C  ibuprofen (ADVIL) 800 MG tablet Take 1 tablet (800 mg total) by mouth 3 (three) times daily. 11/20/19   Wieters, Hallie C, PA-C  loratadine (CLARITIN) 10 MG tablet Take 1 tablet (10 mg total) by mouth daily. 01/28/19   Carney Living, MD  methylPREDNISolone (MEDROL DOSEPAK) 4 MG TBPK tablet 6 day dose pack - take as directed 07/20/19   Felecia Shelling, DPM  Multiple Vitamin (MULTIVITAMIN) tablet Take 2 tablets by mouth daily. Vitafusion gummies    [provider]  pantoprazole (PROTONIX) 20 MG tablet Take 1 tablet (20 mg total) by mouth daily. 03/12/19 11/20/19  Marthenia Rolling, DO    Allergies    Tramadol  Review of Systems   Review of Systems  Constitutional: Negative for chills and fever.  HENT: Negative for congestion, sore throat and tinnitus.   Eyes: Negative for visual disturbance.  Respiratory: Negative for shortness of breath and wheezing.   Cardiovascular: Negative for chest pain and palpitations.  Gastrointestinal: Negative for abdominal pain, diarrhea, nausea and vomiting.  Genitourinary: Negative for enuresis, flank pain and frequency.  Musculoskeletal: Positive for myalgias. Negative for back pain.  Skin: Positive for rash.  Neurological: Negative for dizziness and headaches.  Hematological: Does not bruise/bleed easily.    Physical Exam Updated Vital Signs BP 126/78   Pulse 95   Temp 98.9 F (37.2 C) (Oral)   Resp 16   LMP 11/06/2019   SpO2 98%   Physical Exam Vitals and nursing note reviewed.  Constitutional:      General: She is not in acute distress.    Appearance: She is not ill-appearing.  HENT:     Head: Normocephalic and atraumatic.     Nose: No congestion.      Mouth/Throat:     Mouth: Mucous membranes are moist.     Pharynx: Oropharynx is clear. No oropharyngeal exudate or posterior oropharyngeal erythema.     Comments: Oropharynx visualized, tongue and uvula for both midline, no erythema or edema noted, patient was controlling her secretions without difficulty. Eyes:     General: No scleral icterus. Cardiovascular:     Rate and Rhythm: Normal rate and regular rhythm.     Pulses: Normal pulses.     Heart sounds: No murmur heard.  No friction rub. No gallop.   Pulmonary:     Effort: No respiratory distress.     Breath sounds: No stridor. No wheezing, rhonchi or rales.  Chest:     Chest wall: No tenderness.  Abdominal:     General: There is no distension.  Tenderness: There is no abdominal tenderness. There is no guarding.  Musculoskeletal:        General: No swelling.  Skin:    General: Skin is warm and dry.     Capillary Refill: Capillary refill takes less than 2 seconds.     Findings: Lesion present. No rash.     Comments: Visualized patient's chest there to blisters noted, one on each breast.  There is no erythema, edema, nontender to palpation,  No diffuse rash noted on patient's body.  Neurological:     Mental Status: She is alert.  Psychiatric:        Mood and Affect: Mood normal.     ED Results / Procedures / Treatments   Labs (all labs ordered are listed, but only abnormal results are displayed) Labs Reviewed  BASIC METABOLIC PANEL - Abnormal; Notable for the following components:      Result Value   Glucose, Bld 114 (*)    Calcium 8.4 (*)    All other components within normal limits    EKG None  Radiology No results found.  Procedures Procedures (including critical care time)  Medications Ordered in ED Medications - No data to display  ED Course  I have reviewed the triage vital signs and the nursing notes.  Pertinent labs & imaging results that were available during my care of the patient were  reviewed by me and considered in my medical decision making (see chart for details).    MDM Rules/Calculators/A&P                          I have personally reviewed all imaging, labs and have interpreted them.  Patient did not appear to be in acute distress she was alert and oriented.  On exam 2 blisters noted on patient's breast, no diffuse rash noted, no impending airway compromise, tongue uvula are both midline, no swelling of the tongue or throat.  Will order BMP to evaluate for electrolyte abnormality.  BMP came back did not show any elevated abnormalities, no signs of AKI.  I have low suspicion for anaphylactic shock as patient is nontoxic-appearing, vital signs reassuring, no diffuse rash noted.  Low suspicion for impending airway obstruction as oropharynx was visualized, tongue uvula were both midline, no erythema or edema noted, she was controlling her own secretions difficulty, lung sounds were clear bilaterally no wheezing or stridor heard.  Low suspicion for Trudie Buckler as there is no diffuse rash noted, no lesions in mouth.  Low suspicion for cellulitis as there is no erythema, edema, nontender to palpation.  Unlikely patient suffering from a systemic infection as vital signs are reassuring, patient is nontoxic-appearing, no obvious source of infection seen on exam.  Due to well-appearing patient further lab work and imaging were not warranted.   Patient appears to be resting comfortably in bed show no acute distress.  Vital signs remained stable does not meet criteria to be admitted to the hospital.  Likely blisters are result of mechanical friction from bra.  Recommend general wound care and close monitoring.  Patient discussed with attending who agrees assessment and plan.  Patient was given at home care as well as strict return precautions.  Patient verbalized that she understood and agreed with said plan. Final Clinical Impression(s) / ED Diagnoses Final diagnoses:  Blister  (nonthermal) of breast, left breast, initial encounter  Blister (nonthermal) of breast, right breast, initial encounter    Rx / DC Orders  ED Discharge Orders    None       Barnie Del 11/26/19 1904    Rolan Bucco, MD 11/26/19 [phone]

## 2019-11-26 NOTE — ED Triage Notes (Signed)
Patient is COVID + and reports to the ER for two boil looking areas on her breasts. Patient reports she has a COVID infusion on Tuesday and today noticed the two areas. Patient reports she feels like it is a reaction.,

## 2019-11-26 NOTE — Discharge Instructions (Signed)
You have been seen here for blisters on breasts.  Exam and vital signs look reassuring.  Likely the blisters are result of skin rubbing, please let blisters go away on their own, if they pop you may continue with general wound care.  Clean the area and apply new bandaging on them daily.  If you see blisters are multiplying you may use Benadryl as this can help with an allergic reaction.  I want you to follow-up with post Covid care for further evaluation management of your Covid symptoms.  I want you to come back to emergency department if you develop swelling of your tongue or throat, difficulty swallowing own saliva, chest pain, shortness of breath, uncontrolled nausea, vomiting, diarrhea as these symptoms require further evaluation management.

## 2019-12-03 ENCOUNTER — Telehealth: Payer: Self-pay | Admitting: Family Medicine

## 2019-12-03 NOTE — Telephone Encounter (Signed)
Appt scheduled w/ pccc 9/7

## 2019-12-17 ENCOUNTER — Other Ambulatory Visit: Payer: Self-pay

## 2019-12-17 ENCOUNTER — Encounter: Payer: Self-pay | Admitting: Family Medicine

## 2019-12-17 ENCOUNTER — Ambulatory Visit (INDEPENDENT_AMBULATORY_CARE_PROVIDER_SITE_OTHER): Payer: BC Managed Care – PPO | Admitting: Family Medicine

## 2019-12-17 VITALS — BP 112/68 | HR 91 | Ht 66.5 in | Wt >= 6400 oz

## 2019-12-17 DIAGNOSIS — T300 Burn of unspecified body region, unspecified degree: Secondary | ICD-10-CM | POA: Diagnosis not present

## 2019-12-17 MED ORDER — SILVER SULFADIAZINE 1 % EX CREA: 1.0000 "application " | TOPICAL_CREAM | Freq: Every day | CUTANEOUS | 0 refills | Status: DC

## 2019-12-17 NOTE — Patient Instructions (Signed)
It was nice to meet you today,  I have given you a prescription for Silvadene cream which is a silver-based antimicrobial cream.  I would like you to put it on the wounds once a day in the morning and then cover the wounds with a nonocclusive gauze dressing that I have provided you with.  You can take it off at night if you are not wearing a bra or other tight fitting clothes.  I would like to see you back in 1 week so I can reassess the burn.  If you have any signs of an infection such as pain, fever, worsening redness around the skin you should let us know so he can be seen sooner.  Have a great day,  Frederic Jericho, MD
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
## 2019-12-20 DIAGNOSIS — T300 Burn of unspecified body region, unspecified degree: Secondary | ICD-10-CM | POA: Insufficient documentation
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
## 2019-12-22 ENCOUNTER — Other Ambulatory Visit: Payer: Self-pay | Admitting: Nurse Practitioner

## 2019-12-22 ENCOUNTER — Other Ambulatory Visit: Payer: Self-pay

## 2019-12-22 ENCOUNTER — Ambulatory Visit (INDEPENDENT_AMBULATORY_CARE_PROVIDER_SITE_OTHER): Payer: BC Managed Care – PPO | Admitting: Nurse Practitioner

## 2019-12-22 ENCOUNTER — Ambulatory Visit
Admission: RE | Admit: 2019-12-22 | Discharge: 2019-12-22 | Disposition: A | Discharge status: Home / Self Care | Payer: BC Managed Care – PPO | Source: Ambulatory Visit | Attending: Nurse Practitioner | Admitting: Nurse Practitioner

## 2019-12-22 ENCOUNTER — Telehealth: Payer: Self-pay | Admitting: Nurse Practitioner

## 2019-12-22 VITALS — BP 118/68 | HR 85 | Temp 97.1°F | Ht 66.5 in | Wt 397.0 lb

## 2019-12-22 DIAGNOSIS — J1282 Pneumonia due to coronavirus disease 2019: Secondary | ICD-10-CM

## 2019-12-22 DIAGNOSIS — J302 Other seasonal allergic rhinitis: Secondary | ICD-10-CM | POA: Insufficient documentation

## 2019-12-22 DIAGNOSIS — R05 Cough: Secondary | ICD-10-CM | POA: Diagnosis not present

## 2019-12-22 DIAGNOSIS — Z8616 Personal history of COVID-19: Secondary | ICD-10-CM | POA: Insufficient documentation

## 2019-12-22 DIAGNOSIS — U071 COVID-19: Secondary | ICD-10-CM | POA: Insufficient documentation

## 2019-12-22 DIAGNOSIS — R059 Cough, unspecified: Secondary | ICD-10-CM | POA: Insufficient documentation

## 2019-12-22 HISTORY — DX: Personal history of COVID-19: Z86.16

## 2019-12-22 MED ORDER — ALBUTEROL SULFATE HFA 108 (90 BASE) MCG/ACT IN AERS
2.0000 | INHALATION_SPRAY | Freq: Four times a day (QID) | RESPIRATORY_TRACT | 2 refills | Status: DC | PRN
Start: 2019-12-22 — End: 2022-06-22

## 2019-12-22 MED ORDER — PREDNISONE 10 MG PO TABS
ORAL_TABLET | ORAL | 0 refills | Status: DC
Start: 2019-12-22 — End: 2020-01-12

## 2019-12-22 NOTE — Patient Instructions (Addendum)
Covid cough:  Stay well hydrated  Stay active  Deep breathing exercises  May start vitamin C 2,000 mg daily, vitamin D3 2,000 IU daily, Zinc 220 mg daily, and Quercetin 500 mg twice daily  May take tylenol or fever or pain  Will check chest x ray   Loss of taste and smell:  Hand out given on [employer] re-training program     Follow up:  Follow up in 2 weeks or sooner if needed

## 2019-12-22 NOTE — Telephone Encounter (Signed)
-----   Message from Ivonne Andrew, NP sent at 12/22/2019  1:18 PM EDT ----- Please call to let patient know that her chest x ray is back and did show atelectasis in her lower left lung. I will order her an albuterol inhaler to use every 6 hours as needed for shortness of breath and a round of prednisone. This will help open her lungs - also encourage deep breathing exercises every hour.

## 2019-12-22 NOTE — Progress Notes (Signed)
@Patient  ID: , female    DOB: 06-22-1985, 34 y.o.   MRN: [id number]  Chief Complaint  Patient presents with  . COVID Follow up    Positive 8/6 Sx:No taste or smell, Cough    Referring provider: [id number], MD  34 year old female with history of HTN, GERD, obesity. Diagnosed with Covid 11/20/19.   HPI   Patient presents today for post COVID care clinic visit.  She was seen in the ED 11/20/2019 and diagnosed with Covid.  She received a monoclonal antibody infusion on 11/24/2019.  She states that overall she has improved but still does have a lingering productive cough.  She states that she has not regained her taste or smell at this point.  Patient denies any recent fever.  She states that she does have shortness of breath with exertion. Denies f/c/s, n/v/d, hemoptysis, PND, chest pain or edema.       Allergies  Allergen Reactions  . Tramadol Other (See Comments)    Did not make her feel like herself    Immunization History  Administered Date(s) Administered  . Tdap 07/05/2013    Past Medical History:  Diagnosis Date  . Ankle ulcer (HCC)    right  . Childhood asthma   . Chronic lower back pain   . GERD (gastroesophageal reflux disease)   . Headache    "only when I'm hungry" (10/14/2015)  . Hypertension   . Obesity   . Varicose veins   . VSD (ventricular septal defect)    "had hole in bottom of her heart; closed up on it's own; no OR" /mom 10/14/2015)    Tobacco History: Social History   Tobacco Use  Smoking Status Never Smoker  Smokeless Tobacco Never Used   Counseling given: Not Answered   Outpatient Encounter Medications as of 12/22/2019  Medication Sig  . dextromethorphan-guaiFENesin (MUCINEX DM) 30-600 MG 12hr tablet Take 1 tablet by mouth 2 (two) times daily.  02/21/2020 ibuprofen (ADVIL) 800 MG tablet Take 1 tablet (800 mg total) by mouth 3 (three) times daily.  . Multiple Vitamin (MULTIVITAMIN) tablet Take 2 tablets by mouth daily. Vitafusion  gummies  . fluticasone (FLONASE) 50 MCG/ACT nasal spray Place 1-2 sprays into both nostrils daily for 7 days.  Marland Kitchen loratadine (CLARITIN) 10 MG tablet Take 1 tablet (10 mg total) by mouth daily. (Patient not taking: Reported on 12/22/2019)  . silver sulfADIAZINE (SILVADENE) 1 % cream Apply 1 application topically daily.  . [DISCONTINUED] methylPREDNISolone (MEDROL DOSEPAK) 4 MG TBPK tablet 6 day dose pack - take as directed  . [DISCONTINUED] pantoprazole (PROTONIX) 20 MG tablet Take 1 tablet (20 mg total) by mouth daily.   No facility-administered encounter medications on file as of 12/22/2019.     Review of Systems  Review of Systems  Constitutional: Positive for fatigue. Negative for fever.  HENT: Negative.   Respiratory: Positive for cough and shortness of breath.   Cardiovascular: Negative.  Negative for chest pain, palpitations and leg swelling.  Gastrointestinal: Negative.   Allergic/Immunologic: Negative.   Neurological: Negative.   Psychiatric/Behavioral: Negative.        Physical Exam  BP 118/68 (BP Location: Right Arm)   Pulse 85   Temp (!) 97.1 F (36.2 C)   Ht 5' 6.5" (1.689 m)   Wt (!) 397 lb (180.1 kg)   SpO2 97%   BMI 63.12 kg/m   Wt Readings from Last 5 Encounters:  12/22/19 (!) 397 lb (180.1 kg)  12/17/19 02/16/20)  404 lb (183.3 kg)  11/20/19 (!) 380 lb (172.4 kg)  07/21/19 (!) 384 lb 12.8 oz (174.5 kg)  06/18/18 (!) 325 lb (147.4 kg)     Physical Exam Vitals and nursing note reviewed.  Constitutional:      General: She is not in acute distress.    Appearance: She is well-developed.  Cardiovascular:     Rate and Rhythm: Normal rate and regular rhythm.  Pulmonary:     Effort: Pulmonary effort is normal.     Breath sounds: Normal breath sounds.  Musculoskeletal:     Right lower leg: No edema.     Left lower leg: No edema.  Neurological:     Mental Status: She is alert and oriented to person, place, and time.       Imaging: DG Chest 2  View  Result Date: 12/22/2019 CLINICAL DATA:  Cough, prior COVID pneumonia EXAM: CHEST - 2 VIEW COMPARISON:  06/01/2018 FINDINGS: Mild left basilar opacity, likely atelectasis. Right lung is clear. No pleural effusion or pneumothorax. Mild cardiomegaly. Mild degenerative changes of the visualized thoracolumbar spine. IMPRESSION: Mild left basilar opacity, likely atelectasis. Electronically Signed   By: Charline Bills M.D.   On: 12/22/2019 11:50     Assessment & Plan:   History of COVID-19 cough:  Stay well hydrated  Stay active  Deep breathing exercises  May start vitamin C 2,000 mg daily, vitamin D3 2,000 IU daily, Zinc 220 mg daily, and Quercetin 500 mg twice daily  May take tylenol or fever or pain  Will check chest x ray   Loss of taste and smell:  Hand out given on [employer] re-training program     Follow up:  Follow up in 2 weeks or sooner if needed      Ivonne Andrew, NP 12/22/2019

## 2019-12-22 NOTE — Assessment & Plan Note (Signed)
cough:  Stay well hydrated  Stay active  Deep breathing exercises  May start vitamin C 2,000 mg daily, vitamin D3 2,000 IU daily, Zinc 220 mg daily, and Quercetin 500 mg twice daily  May take tylenol or fever or pain  Will check chest x ray   Loss of taste and smell:  Hand out given on [employer] re-training program     Follow up:  Follow up in 2 weeks or sooner if needed

## 2019-12-22 NOTE — Telephone Encounter (Signed)
Patient notified of results, no additional questions. Confirmed pharmacy on file. 3 week appointment scheduled 9/28 1:15pm.

## 2019-12-25 ENCOUNTER — Ambulatory Visit: Payer: BC Managed Care – PPO | Admitting: Family Medicine

## 2019-12-31 ENCOUNTER — Ambulatory Visit (INDEPENDENT_AMBULATORY_CARE_PROVIDER_SITE_OTHER): Payer: BC Managed Care – PPO | Admitting: Family Medicine

## 2019-12-31 ENCOUNTER — Encounter: Payer: Self-pay | Admitting: Family Medicine

## 2019-12-31 ENCOUNTER — Other Ambulatory Visit: Payer: Self-pay

## 2019-12-31 DIAGNOSIS — T300 Burn of unspecified body region, unspecified degree: Secondary | ICD-10-CM | POA: Diagnosis not present

## 2019-12-31 MED ORDER — SILVER SULFADIAZINE 1 % EX CREA: 1.0000 "application " | TOPICAL_CREAM | Freq: Every day | CUTANEOUS | 0 refills | Status: DC

## 2019-12-31 NOTE — Patient Instructions (Signed)
Thank you for coming to see me today. It was a pleasure.  The areas are looking good.  Continue your current treatment  Please follow-up with PCP in 1 week  If you have any questions or concerns, please do not hesitate to call the office at [phone].  Best,   Dana Allan, MD [facility]

## 2020-01-02 ENCOUNTER — Emergency Department (HOSPITAL_COMMUNITY): Payer: BC Managed Care – PPO

## 2020-01-02 ENCOUNTER — Emergency Department (HOSPITAL_COMMUNITY)
Admission: EM | Admit: 2020-01-02 | Discharge: 2020-01-02 | Disposition: A | Discharge status: Home / Self Care | Payer: BC Managed Care – PPO | Attending: Emergency Medicine | Admitting: Emergency Medicine

## 2020-01-02 ENCOUNTER — Encounter (HOSPITAL_COMMUNITY): Payer: Self-pay

## 2020-01-02 ENCOUNTER — Other Ambulatory Visit: Payer: Self-pay

## 2020-01-02 DIAGNOSIS — M549 Dorsalgia, unspecified: Secondary | ICD-10-CM | POA: Diagnosis not present

## 2020-01-02 DIAGNOSIS — Z79899 Other long term (current) drug therapy: Secondary | ICD-10-CM | POA: Insufficient documentation

## 2020-01-02 DIAGNOSIS — R0789 Other chest pain: Secondary | ICD-10-CM

## 2020-01-02 DIAGNOSIS — R1013 Epigastric pain: Secondary | ICD-10-CM | POA: Diagnosis not present

## 2020-01-02 DIAGNOSIS — R05 Cough: Secondary | ICD-10-CM | POA: Diagnosis not present

## 2020-01-02 DIAGNOSIS — R000 Tachycardia, unspecified: Secondary | ICD-10-CM | POA: Insufficient documentation

## 2020-01-02 DIAGNOSIS — R252 Cramp and spasm: Secondary | ICD-10-CM | POA: Diagnosis present

## 2020-01-02 DIAGNOSIS — R1011 Right upper quadrant pain: Secondary | ICD-10-CM | POA: Insufficient documentation

## 2020-01-02 DIAGNOSIS — Z8616 Personal history of COVID-19: Secondary | ICD-10-CM | POA: Diagnosis not present

## 2020-01-02 DIAGNOSIS — R1012 Left upper quadrant pain: Secondary | ICD-10-CM | POA: Diagnosis not present

## 2020-01-02 DIAGNOSIS — I10 Essential (primary) hypertension: Secondary | ICD-10-CM | POA: Insufficient documentation

## 2020-01-02 LAB — CBC WITH DIFFERENTIAL/PLATELET
Abs Immature Granulocytes: 0.05 10*3/uL (ref 0.00–0.07)
Basophils Absolute: 0.1 10*3/uL (ref 0.0–0.1)
Basophils Relative: 1 %
Eosinophils Absolute: 0.1 10*3/uL (ref 0.0–0.5)
Eosinophils Relative: 1 %
HCT: 40.4 % (ref 36.0–46.0)
Hemoglobin: 12.6 g/dL (ref 12.0–15.0)
Immature Granulocytes: 1 %
Lymphocytes Relative: 24 %
Lymphs Abs: 2.4 10*3/uL (ref 0.7–4.0)
MCH: 29 pg (ref 26.0–34.0)
MCHC: 31.2 g/dL (ref 30.0–36.0)
MCV: 92.9 fL (ref 80.0–100.0)
Monocytes Absolute: 0.7 10*3/uL (ref 0.1–1.0)
Monocytes Relative: 7 %
Neutro Abs: 6.6 10*3/uL (ref 1.7–7.7)
Neutrophils Relative %: 66 %
Platelets: 305 10*3/uL (ref 150–400)
RBC: 4.35 MIL/uL (ref 3.87–5.11)
RDW: 14.3 % (ref 11.5–15.5)
WBC: 9.9 10*3/uL (ref 4.0–10.5)
nRBC: 0 % (ref 0.0–0.2)

## 2020-01-02 LAB — D-DIMER, QUANTITATIVE: D-Dimer, Quant: 1.28 ug/mL-FEU — ABNORMAL HIGH (ref 0.00–0.50)

## 2020-01-02 LAB — COMPREHENSIVE METABOLIC PANEL
ALT: 17 U/L (ref 0–44)
AST: 15 U/L (ref 15–41)
Albumin: 3.4 g/dL — ABNORMAL LOW (ref 3.5–5.0)
Alkaline Phosphatase: 45 U/L (ref 38–126)
Anion gap: 10 (ref 5–15)
BUN: 10 mg/dL (ref 6–20)
CO2: 26 mmol/L (ref 22–32)
Calcium: 8.7 mg/dL — ABNORMAL LOW (ref 8.9–10.3)
Chloride: 102 mmol/L (ref 98–111)
Creatinine, Ser: 0.89 mg/dL (ref 0.44–1.00)
GFR calc Af Amer: >60 mL/min (ref >60)
GFR calc non Af Amer: >60 mL/min (ref >60)
Glucose, Bld: 153 mg/dL — ABNORMAL HIGH (ref 70–99)
Potassium: 3.5 mmol/L (ref 3.5–5.1)
Sodium: 138 mmol/L (ref 135–145)
Total Bilirubin: 0.8 mg/dL (ref 0.3–1.2)
Total Protein: 7 g/dL (ref 6.5–8.1)

## 2020-01-02 LAB — LIPASE, BLOOD: Lipase: 22 U/L (ref 11–51)

## 2020-01-02 LAB — URINALYSIS, ROUTINE W REFLEX MICROSCOPIC
Bilirubin Urine: NEGATIVE
Glucose, UA: NEGATIVE mg/dL
Hgb urine dipstick: NEGATIVE
Ketones, ur: NEGATIVE mg/dL
Leukocytes,Ua: NEGATIVE
Nitrite: NEGATIVE
Protein, ur: NEGATIVE mg/dL
Specific Gravity, Urine: 1.024 (ref 1.005–1.030)
pH: 7 (ref 5.0–8.0)

## 2020-01-02 LAB — I-STAT BETA HCG BLOOD, ED (MC, WL, AP ONLY): I-stat hCG, quantitative: <5 m[IU]/mL (ref <5)

## 2020-01-02 LAB — MAGNESIUM: Magnesium: 1.7 mg/dL (ref 1.7–2.4)

## 2020-01-02 LAB — TROPONIN I (HIGH SENSITIVITY): Troponin I (High Sensitivity): 3 ng/L (ref <18)

## 2020-01-02 LAB — CK: Total CK: 102 U/L (ref 38–234)

## 2020-01-02 MED ORDER — FENTANYL CITRATE (PF) 100 MCG/2ML IJ SOLN
100.0000 ug | Freq: Once | INTRAMUSCULAR | Status: AC
  Administered 2020-01-02: 100 ug via INTRAVENOUS
  Filled 2020-01-02: qty 2

## 2020-01-02 MED ORDER — IOHEXOL 350 MG/ML SOLN
100.0000 mL | Freq: Once | INTRAVENOUS | Status: AC | PRN
  Administered 2020-01-02: 100 mL via INTRAVENOUS

## 2020-01-02 MED ORDER — SODIUM CHLORIDE 0.9 % IV BOLUS
1000.0000 mL | Freq: Once | INTRAVENOUS | Status: AC
  Administered 2020-01-02: 1000 mL via INTRAVENOUS

## 2020-01-02 MED ORDER — NAPROXEN 375 MG PO TABS: 375.0000 mg | ORAL_TABLET | Freq: Two times a day (BID) | ORAL | 0 refills | Status: DC

## 2020-01-02 MED ORDER — KETOROLAC TROMETHAMINE 15 MG/ML IJ SOLN
15.0000 mg | Freq: Once | INTRAMUSCULAR | Status: AC
  Administered 2020-01-02: 15 mg via INTRAVENOUS
  Filled 2020-01-02: qty 1

## 2020-01-02 NOTE — ED Notes (Signed)
Patient transported to X-ray 

## 2020-01-02 NOTE — ED Triage Notes (Signed)
Pt presents to ED with complaints of back pain to mid back with intermittent cough and sob. Pt reports dx of covid 19 about a month and a half ago. At follow up appointment last week chest xray revealed "fluid or PNA". PT states MD was unsure of which it was, so she sent her home on prednisone. Pt endorses feeling worse this week.

## 2020-01-02 NOTE — ED Provider Notes (Signed)
[facility] EMERGENCY DEPARTMENT Provider Note   CSN: [id number] Arrival date & time: 01/02/20  1710     History Chief Complaint  Patient presents with  . Back Pain  . Shortness of Breath    [name] is a 34 y.o. female.  HPI 34 year old female presents with bilateral muscle cramps.  She states that she has been having this pain on and off for a couple days.  Seems to come and go randomly though certain movements or deep breathing make it worse.  It is bilateral back/mid axillary line.  No shortness of breath.  She has a residual mild cough since she had Covid to the beginning of August.  No fevers during this time and no urinary symptoms.  Pain was a 10 when she came in but is now a 7.   Past Medical History:  Diagnosis Date  . Ankle ulcer (HCC)    right  . Childhood asthma   . Chronic lower back pain   . GERD (gastroesophageal reflux disease)   . Headache    "only when I'm hungry" (10/14/2015)  . Hypertension   . Obesity   . Varicose veins   . VSD (ventricular septal defect)    "had hole in bottom of her heart; closed up on it's own; no OR" /mom 10/14/2015)    Patient Active Problem List   Diagnosis Date Noted  . History of COVID-19 12/22/2019  . Pneumonia due to COVID-19 virus 12/22/2019  . Cough 12/22/2019  . Thermal burn 12/20/2019  . Pap smear for cervical cancer screening 01/06/2018  . Gastroesophageal reflux disease 01/06/2018  . Candida vaginitis 01/06/2018  . Dizziness 06/06/2017  . Leg edema 12/12/2016  . Orthopnea 12/12/2016  . Snoring 12/12/2016  . Plantar fasciitis 12/12/2016  . Umbilical hernia 10/14/2015  . Epigastric abdominal pain 03/01/2015  . 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 Low back pain 04/23/2014  . Possible exposure to STD 01/25/2014  . Birth control counseling 01/25/2014  . Essential hypertension, benign 11/16/2013  . Well woman exam 11/16/2013  . Elevated BP  10/08/2013  . Menorrhagia 10/08/2013  . History of iritis 10/08/2013  . Varicose veins of lower extremities with other complications 10/25/2011  . Obesity 10/25/2011    Past Surgical History:  Procedure Laterality Date  . HERNIA REPAIR    . INSERTION OF MESH N/A 10/14/2015   Procedure: INSERTION OF MESH;  Surgeon: Avel Peace, MD;  Location: [facility] OR;  Service: General;  Laterality: N/A;  . LAPAROSCOPIC INCISIONAL / UMBILICAL / VENTRAL HERNIA REPAIR  10/14/2015   VHR w/mesh  . TONSILLECTOMY AND ADENOIDECTOMY  1990s  . TYMPANOSTOMY TUBE PLACEMENT Bilateral 1990s  . VEIN LIGATION AND STRIPPING Right 12/15/2014   Procedure: SAPHENOUS VEIN LIGATION AND STRIPPING; GREATER THAN 20 STAB PHLEBECTOMIES;  Surgeon: Larina Earthly, MD;  Location: [facility] OR;  Service: Vascular;  Laterality: Right;  . VENTRAL HERNIA REPAIR N/A 10/14/2015   Procedure: LAPAROSCOPIC VENTRAL HERNIA;  Surgeon: Avel Peace, MD;  Location: [facility] OR;  Service: General;  Laterality: N/A;     OB History   No obstetric history on file.     Family History  Problem Relation Age of Onset  . Diabetes Mother   . Varicose Veins Mother   . Hypertension Mother   . Hyperlipidemia Mother   . Healthy Father   . Varicose Veins Sister   . Cancer Sister  unsure of type but requiring hysterectomy    Social History   Tobacco Use  . Smoking status: Never Smoker  . Smokeless tobacco: Never Used  Vaping Use  . Vaping Use: Never used  Substance Use Topics  . Alcohol use: Yes    Comment:  socially  . Drug use: Yes    Types: Marijuana    Home Medications Prior to Admission medications   Medication Sig Start Date End Date Taking? Authorizing Provider  albuterol (VENTOLIN HFA) 108 (90 Base) MCG/ACT inhaler Inhale 2 puffs into the lungs every 6 (six) hours as needed for wheezing or shortness of breath. 12/22/19   Ivonne Andrew, NP  dextromethorphan-guaiFENesin (MUCINEX DM) 30-600 MG 12hr tablet Take 1 tablet by mouth 2 (two)  times daily. 11/20/19   Wieters, Hallie C, PA-C  fluticasone (FLONASE) 50 MCG/ACT nasal spray Place 1-2 sprays into both nostrils daily for 7 days. 11/20/19 11/27/19  Wieters, Hallie C, PA-C  loratadine (CLARITIN) 10 MG tablet Take 1 tablet (10 mg total) by mouth daily. Patient not taking: Reported on 12/22/2019 01/28/19   Carney Living, MD  Multiple Vitamin (MULTIVITAMIN) tablet Take 2 tablets by mouth daily. Vitafusion gummies    [provider]  naproxen (NAPROSYN) 375 MG tablet Take 1 tablet (375 mg total) by mouth 2 (two) times daily with a meal. 01/02/20   Pricilla Loveless, MD  predniSONE (DELTASONE) 10 MG tablet Take 4 tabs for 2 days, then 3 tabs for 2 days, then 2 tabs for 2 days, then 1 tab for 2 days, then stop 12/22/19   Ivonne Andrew, NP  silver sulfADIAZINE (SILVADENE) 1 % cream Apply 1 application topically daily. 12/31/19   Dana Allan, MD  pantoprazole (PROTONIX) 20 MG tablet Take 1 tablet (20 mg total) by mouth daily. 03/12/19 11/20/19  Marthenia Rolling, DO    Allergies    Tramadol  Review of Systems   Review of Systems  Constitutional: Negative for fever.  Respiratory: Positive for cough. Negative for shortness of breath.   Cardiovascular: Positive for chest pain. Negative for leg swelling.  Gastrointestinal: Negative for abdominal pain and vomiting.  Genitourinary: Negative for dysuria.  Musculoskeletal: Positive for back pain.  All other systems reviewed and are negative.   Physical Exam Updated Vital Signs BP 92/71   Pulse 92   Temp 99.1 F (37.3 C) (Oral)   Resp (!) 22   Ht 5\' 6"  (1.676 m)   Wt (!) 180.5 kg   LMP 12/30/2019 (Approximate)   SpO2 97%   BMI 64.24 kg/m   Physical Exam Vitals and nursing note reviewed.  Constitutional:      General: She is not in acute distress.    Appearance: She is well-developed. She is obese. She is not diaphoretic.  HENT:     Head: Normocephalic and atraumatic.     Right Ear: External ear normal.     Left Ear:  External ear normal.     Nose: Nose normal.  Eyes:     General:        Right eye: No discharge.        Left eye: No discharge.  Cardiovascular:     Rate and Rhythm: Regular rhythm. Tachycardia present.     Heart sounds: Normal heart sounds.  Pulmonary:     Effort: Pulmonary effort is normal. No tachypnea, accessory muscle usage or respiratory distress.     Breath sounds: Decreased breath sounds (mild, bases) present.  Comments: Pain to palpation over mid-upper thoracic lateral back and into-mid axillary line. No skin changes/rash Abdominal:     Palpations: Abdomen is soft.     Tenderness: There is abdominal tenderness (mild) in the right upper quadrant, epigastric area and left upper quadrant.  Skin:    General: Skin is warm and dry.  Neurological:     Mental Status: She is alert.  Psychiatric:        Mood and Affect: Mood is not anxious.     ED Results / Procedures / Treatments   Labs (all labs ordered are listed, but only abnormal results are displayed) Labs Reviewed  COMPREHENSIVE METABOLIC PANEL - Abnormal; Notable for the following components:      Result Value   Glucose, Bld 153 (*)    Calcium 8.7 (*)    Albumin 3.4 (*)    All other components within normal limits  D-DIMER, QUANTITATIVE (NOT AT [facility]) - Abnormal; Notable for the following components:   D-Dimer, Quant 1.28 (*)    All other components within normal limits  CBC WITH DIFFERENTIAL/PLATELET  LIPASE, BLOOD  MAGNESIUM  CK  URINALYSIS, ROUTINE W REFLEX MICROSCOPIC  I-STAT BETA HCG BLOOD, ED ([facility], [facility], [facility] ONLY)  TROPONIN I (HIGH SENSITIVITY)    EKG EKG Interpretation  Date/Time:  Saturday January 02 2020 17:41:04 EDT Ventricular Rate:  115 PR Interval:  152 QRS Duration: 90 QT Interval:  348 QTC Calculation: 481 R Axis:   60 Text Interpretation: Sinus tachycardia with occasional Premature ventricular complexes Possible Left atrial enlargement ST & T wave abnormality, consider inferior  ischemia ST & T wave abnormality, consider anterolateral ischemia Abnormal ECG HR is faster, otherwise ST/T changes in similar distribution to June 2017 Confirmed by Pricilla LovelessGoldston, Devra Stare [phone]([id number]) on 01/02/2020 5:56:00 PM   Radiology DG Chest 2 View  Result Date: 01/02/2020 CLINICAL DATA:  Shortness of breath x3 days. EXAM: CHEST - 2 VIEW COMPARISON:  December 22, 2019 FINDINGS: Very mild, stable atelectasis is seen within the bilateral lung bases. There is no evidence of a pleural effusion or pneumothorax. The cardiac silhouette is moderately enlarged and unchanged in size. The visualized skeletal structures are unremarkable. Radiopaque surgical coils are seen within the visualized portion of the abdomen on the lateral view. IMPRESSION: Very mild, stable bibasilar atelectasis. Electronically Signed   By: Aram Candelahaddeus  Houston M.D.   On: 01/02/2020 18:10   CT Angio Chest PE W and/or Wo Contrast  Result Date: 01/02/2020 CLINICAL DATA:  Cough, COVID pneumonia, chest and back pain EXAM: CT ANGIOGRAPHY CHEST WITH CONTRAST TECHNIQUE: Multidetector CT imaging of the chest was performed using the standard protocol during bolus administration of intravenous contrast. Multiplanar CT image reconstructions and MIPs were obtained to evaluate the vascular anatomy. CONTRAST:  100mL OMNIPAQUE IOHEXOL 350 MG/ML SOLN COMPARISON:  None. FINDINGS: Cardiovascular: Satisfactory opacification of the pulmonary arteries to the segmental level. No evidence of pulmonary embolism. The central pulmonary arteries are enlarged in keeping with changes of pulmonary arterial hypertension. No significant coronary artery calcification. Cardiac size is mildly enlarged. No pericardial effusion. The thoracic aorta is unremarkable. Mediastinum/Nodes: No enlarged mediastinal, hilar, or axillary lymph nodes. Thyroid gland, trachea, and esophagus demonstrate no significant findings. Lungs/Pleura: Mild bibasilar atelectasis. No superimposed confluent pulmonary  infiltrates or focal pulmonary nodules. No pneumothorax or pleural effusion. The central airways are widely patent. Upper Abdomen: No acute abnormality. Musculoskeletal: No chest wall abnormality. No acute or significant osseous findings. Review of the MIP images confirms the above findings. IMPRESSION:  No evidence of pulmonary embolism through the segmental level. Mild cardiomegaly. Morphologic changes in keeping with pulmonary arterial hypertension. Electronically Signed   By: Helyn Numbers MD   On: 01/02/2020 21:31    Procedures Procedures (including critical care time)  Medications Ordered in ED Medications  sodium chloride 0.9 % bolus 1,000 mL (0 mLs Intravenous Stopped 01/02/20 1955)  fentaNYL (SUBLIMAZE) injection 100 mcg (100 mcg Intravenous Given 01/02/20 1843)  ketorolac (TORADOL) 15 MG/ML injection 15 mg (15 mg Intravenous Given 01/02/20 2032)  iohexol (OMNIPAQUE) 350 MG/ML injection 100 mL (100 mLs Intravenous Contrast Given 01/02/20 2107)    ED Course  I have reviewed the triage vital signs and the nursing notes.  Pertinent labs & imaging results that were available during my care of the patient were reviewed by me and considered in my medical decision making (see chart for details).    MDM Rules/Calculators/A&P                          Given her tachycardia and bilateral chest/back pain, workup for PE obtained. Also has mild upper abdominal tenderness but no focal findings or severe pain. Labs have been reviewed and are unremarkable, including troponin, besides her ddimer. Due to this CTA obtained and is negative (personally reviewed). Probably muscular. Will d/c with NSAIDs, f/u with PCP.  Final Clinical Impression(s) / ED Diagnoses Final diagnoses:  Chest wall pain    Rx / DC Orders ED Discharge Orders         Ordered    naproxen (NAPROSYN) 375 MG tablet  2 times daily with meals        01/02/20 2201           Pricilla Loveless, MD 01/02/20 2311

## 2020-01-02 NOTE — Discharge Instructions (Signed)
If you develop recurrent, continued, or worsening chest pain, shortness of breath, fever, vomiting, abdominal or back pain, or any other new/concerning symptoms then return to the ER for evaluation.   You are being prescribed Naproxen. Do not take other nsaids such as Ibuprofen/Advil/Aleve/Motrin/Goody Powders/BC powders/Meloxicam/Diclofenac/Indomethacin and other Nonsteroidal anti-inflammatory medications

## 2020-01-03 ENCOUNTER — Encounter: Payer: Self-pay | Admitting: Family Medicine

## 2020-01-03 NOTE — Progress Notes (Signed)
    SUBJECTIVE:   CHIEF COMPLAINT / HPI: follow up thermal burn  Continues to use silvadene cream.  Reports no drainage or fevers and seems to be improving.  PERTINENT  PMH / PSH:  None  OBJECTIVE:   BP 132/80   Pulse 99   Ht 5\' 6"  (1.676 m)   Wt (!) 398 lb 9.6 oz (180.8 kg)   LMP 12/30/2019 (Approximate)   SpO2 98%   BMI 64.34 kg/m    General: Alert and oriented, no apparent distress  Derm: Thermal burns medial aspect of each breast.  No erythema, drainage or fluctuation.  Slightly tender to palpate.  See pics in Media   ASSESSMENT/PLAN:   Thermal burn Improving.  See Media pics -Continue current management, refill sent to pharmacy -Strict return precautions provided -Follow up with PCP in 1week     01/01/2020, MD [facility]

## 2020-01-03 NOTE — Assessment & Plan Note (Signed)
Improving.  See Media pics -Continue current management, refill sent to pharmacy -Strict return precautions provided -Follow up with PCP in 1week

## 2020-01-12 ENCOUNTER — Other Ambulatory Visit: Payer: Self-pay

## 2020-01-12 ENCOUNTER — Ambulatory Visit (INDEPENDENT_AMBULATORY_CARE_PROVIDER_SITE_OTHER): Payer: BC Managed Care – PPO | Admitting: Nurse Practitioner

## 2020-01-12 DIAGNOSIS — Z8616 Personal history of COVID-19: Secondary | ICD-10-CM | POA: Diagnosis not present

## 2020-01-12 NOTE — Patient Instructions (Addendum)
History of Covid:  Stay well hydrated  Stay active  Deep breathing exercises  May start vitamin C 2,000 mg daily, vitamin D3 2,000 IU daily, Zinc 220 mg daily, and Quercetin 500 mg twice daily  May take tylenol or fever or pain  Recent CTA showed pulmonary arterial hypertension - will refer to pulmonary  Altered taste and smell;  Will place referral to neurology - Dr. Vickey Huger   Follow up:  Follow up as needed

## 2020-01-12 NOTE — Progress Notes (Signed)
@Patient  ID: , female    DOB: [birth year], 34 y.o.   MRN: [id number]  Chief Complaint  Patient presents with  . Follow-up    3 week follow up; intermittent smell     Referring provider: [id number], MD   34 year old female with history of HTN, GERD, obesity. Diagnosed with Covid 11/20/19.    HPI  Patient presents today for post COVID care clinic visit.  Since her last visit here she was seen in the ED for chest wall pain.  CTA revealed mild cardiomegaly with morphologic changes in keeping with pulmonary arterial hypertension.  It was noted in ED visit that labs were overall normal.  Patient was discharged home with NSAIDs for chest wall pain.  She states that since that time she has been doing well.  She does still complain of ongoing altered taste and smell.  At her last visit here she was given a handout on the [facility] retraining program.  We have found out that this program has been field and is no longer accepting new patients.  We discussed referral to neurology. Denies f/c/s, n/v/d, hemoptysis, PND, chest pain or edema.        Allergies  Allergen Reactions  . Tramadol Other (See Comments)    Did not make her feel like herself    Immunization History  Administered Date(s) Administered  . Tdap 07/05/2013    Past Medical History:  Diagnosis Date  . Ankle ulcer (HCC)    right  . Childhood asthma   . Chronic lower back pain   . GERD (gastroesophageal reflux disease)   . Headache    "only when I'm hungry" (10/14/2015)  . Hypertension   . Obesity   . Varicose veins   . VSD (ventricular septal defect)    "had hole in bottom of her heart; closed up on it's own; no OR" /mom 10/14/2015)    Tobacco History: Social History   Tobacco Use  Smoking Status Never Smoker  Smokeless Tobacco Never Used   Counseling given: Yes   Outpatient Encounter Medications as of 01/12/2020  Medication Sig  . albuterol (VENTOLIN HFA) 108  (90 Base) MCG/ACT inhaler Inhale 2 puffs into the lungs every 6 (six) hours as needed for wheezing or shortness of breath.  . dextromethorphan-guaiFENesin (MUCINEX DM) 30-600 MG 12hr tablet Take 1 tablet by mouth 2 (two) times daily.  . Multiple Vitamin (MULTIVITAMIN) tablet Take 2 tablets by mouth daily. Vitafusion gummies  . naproxen (NAPROSYN) 375 MG tablet Take 1 tablet (375 mg total) by mouth 2 (two) times daily with a meal.  . silver sulfADIAZINE (SILVADENE) 1 % cream Apply 1 application topically daily.  . [DISCONTINUED] predniSONE (DELTASONE) 10 MG tablet Take 4 tabs for 2 days, then 3 tabs for 2 days, then 2 tabs for 2 days, then 1 tab for 2 days, then stop  . fluticasone (FLONASE) 50 MCG/ACT nasal spray Place 1-2 sprays into both nostrils daily for 7 days.  01/14/2020 loratadine (CLARITIN) 10 MG tablet Take 1 tablet (10 mg total) by mouth daily. (Patient not taking: Reported on 01/12/2020)  . [DISCONTINUED] pantoprazole (PROTONIX) 20 MG tablet Take 1 tablet (20 mg total) by mouth daily.   No facility-administered encounter medications on file as of 01/12/2020.     Review of Systems  Review of Systems  Constitutional: Negative.  Negative for fatigue and fever.  HENT: Negative.   Respiratory: Positive for shortness of breath. Negative for cough.  Cardiovascular: Negative.   Gastrointestinal: Negative.   Allergic/Immunologic: Negative.   Neurological: Negative.        Loss of taste and smell.  Psychiatric/Behavioral: Negative.        Physical Exam  BP 118/72 (BP Location: Right Arm)   Pulse 75   Temp (!) 97.5 F (36.4 C)   Ht 5\' 6"  (1.676 m)   Wt (!) 405 lb 0.1 oz (183.7 kg)   LMP 12/30/2019 (Approximate)   SpO2 98%   BMI 65.37 kg/m   Wt Readings from Last 5 Encounters:  01/12/20 (!) 405 lb 0.1 oz (183.7 kg)  01/02/20 (!) 398 lb (180.5 kg)  12/31/19 (!) 398 lb 9.6 oz (180.8 kg)  12/22/19 (!) 397 lb (180.1 kg)  12/17/19 (!) 404 lb (183.3 kg)     Physical Exam Vitals  and nursing note reviewed.  Constitutional:      General: She is not in acute distress.    Appearance: She is well-developed.  Cardiovascular:     Rate and Rhythm: Normal rate and regular rhythm.  Pulmonary:     Effort: Pulmonary effort is normal.     Breath sounds: Normal breath sounds.  Musculoskeletal:     Right lower leg: No edema.     Left lower leg: No edema.  Neurological:     Mental Status: She is alert and oriented to person, place, and time.  Psychiatric:        Mood and Affect: Mood normal.        Behavior: Behavior normal.       Imaging: DG Chest 2 View  Result Date: 01/02/2020 CLINICAL DATA:  Shortness of breath x3 days. EXAM: CHEST - 2 VIEW COMPARISON:  December 22, 2019 FINDINGS: Very mild, stable atelectasis is seen within the bilateral lung bases. There is no evidence of a pleural effusion or pneumothorax. The cardiac silhouette is moderately enlarged and unchanged in size. The visualized skeletal structures are unremarkable. Radiopaque surgical coils are seen within the visualized portion of the abdomen on the lateral view. IMPRESSION: Very mild, stable bibasilar atelectasis. Electronically Signed   By: December 24, 2019 M.D.   On: 01/02/2020 18:10   DG Chest 2 View  Result Date: 12/22/2019 CLINICAL DATA:  Cough, prior COVID pneumonia EXAM: CHEST - 2 VIEW COMPARISON:  06/01/2018 FINDINGS: Mild left basilar opacity, likely atelectasis. Right lung is clear. No pleural effusion or pneumothorax. Mild cardiomegaly. Mild degenerative changes of the visualized thoracolumbar spine. IMPRESSION: Mild left basilar opacity, likely atelectasis. Electronically Signed   By: 06/03/2018 M.D.   On: 12/22/2019 11:50   CT Angio Chest PE W and/or Wo Contrast  Result Date: 01/02/2020 CLINICAL DATA:  Cough, COVID pneumonia, chest and back pain EXAM: CT ANGIOGRAPHY CHEST WITH CONTRAST TECHNIQUE: Multidetector CT imaging of the chest was performed using the standard protocol during  bolus administration of intravenous contrast. Multiplanar CT image reconstructions and MIPs were obtained to evaluate the vascular anatomy. CONTRAST:  01/04/2020 OMNIPAQUE IOHEXOL 350 MG/ML SOLN COMPARISON:  None. FINDINGS: Cardiovascular: Satisfactory opacification of the pulmonary arteries to the segmental level. No evidence of pulmonary embolism. The central pulmonary arteries are enlarged in keeping with changes of pulmonary arterial hypertension. No significant coronary artery calcification. Cardiac size is mildly enlarged. No pericardial effusion. The thoracic aorta is unremarkable. Mediastinum/Nodes: No enlarged mediastinal, hilar, or axillary lymph nodes. Thyroid gland, trachea, and esophagus demonstrate no significant findings. Lungs/Pleura: Mild bibasilar atelectasis. No superimposed confluent pulmonary infiltrates or focal pulmonary nodules. No pneumothorax or  pleural effusion. The central airways are widely patent. Upper Abdomen: No acute abnormality. Musculoskeletal: No chest wall abnormality. No acute or significant osseous findings. Review of the MIP images confirms the above findings. IMPRESSION: No evidence of pulmonary embolism through the segmental level. Mild cardiomegaly. Morphologic changes in keeping with pulmonary arterial hypertension. Electronically Signed   By: Helyn Numbers MD   On: 01/02/2020 21:31     Assessment & Plan:   History of COVID-19 Stay well hydrated  Stay active  Deep breathing exercises  May start vitamin C 2,000 mg daily, vitamin D3 2,000 IU daily, Zinc 220 mg daily, and Quercetin 500 mg twice daily  May take tylenol or fever or pain  Recent CTA showed pulmonary arterial hypertension - will refer to pulmonary  Altered taste and smell;  Will place referral to neurology - Dr. Vickey Huger   Follow up:  Follow up as needed      Ivonne Andrew, NP 01/13/2020

## 2020-01-12 NOTE — Progress Notes (Signed)
    SUBJECTIVE:   CHIEF COMPLAINT / HPI:   Pt still putting silvadene on wounds daily and covering with gauze.  No pain or itching.    PERTINENT  PMH / PSH: obesity  OBJECTIVE:   BP 124/82   Pulse (!) 108   Wt (!) 409 lb 12.8 oz (185.9 kg)   LMP 12/30/2019 (Approximate)   SpO2 96%   BMI 66.14 kg/m   Gen: alert. Oriented no acute distress.   Skin: well healing circular burns on the medial aspect of each breast. Skin has completely healed over. Still showing hypopigmentation. No signs of infection.  See mediat tab for pics.   ASSESSMENT/PLAN:   Thermal burn Healing well. Advised pt to switch to vaseline for approximately one more week before stopping with bandage and ointment.  Will have pt follow up one more time in approximately one month at which time we can discuss her other chronic medical problems.       Sandre Kitty, MD [facility]

## 2020-01-13 ENCOUNTER — Other Ambulatory Visit: Payer: Self-pay

## 2020-01-13 ENCOUNTER — Encounter: Payer: Self-pay | Admitting: Family Medicine

## 2020-01-13 ENCOUNTER — Ambulatory Visit (INDEPENDENT_AMBULATORY_CARE_PROVIDER_SITE_OTHER): Payer: BC Managed Care – PPO | Admitting: Family Medicine

## 2020-01-13 DIAGNOSIS — T300 Burn of unspecified body region, unspecified degree: Secondary | ICD-10-CM | POA: Diagnosis not present

## 2020-01-13 NOTE — Patient Instructions (Addendum)
It was nice to see you again today,  Your wounds are healing nicely.  You can switch off of of the Silvadene and put a light coating of Vaseline on there instead during the day.  I would continue to put gauze over top of it to prevent friction for another 1 to 2 weeks, but I think after that you should be able to leave off the gauze and Vaseline completely.  I would like to see you back in 1 month so we can look at it 1 more time and also discuss your other medical issues.  Have a great day,  Frederic Jericho, MD

## 2020-01-13 NOTE — Assessment & Plan Note (Addendum)
Stay well hydrated  Stay active  Deep breathing exercises  May start vitamin C 2,000 mg daily, vitamin D3 2,000 IU daily, Zinc 220 mg daily, and Quercetin 500 mg twice daily  May take tylenol or fever or pain  Recent CTA showed pulmonary arterial hypertension - will refer to pulmonary  Altered taste and smell;  Will place referral to neurology - Dr. Vickey Huger   Follow up:  Follow up as needed

## 2020-01-16 NOTE — Assessment & Plan Note (Signed)
Healing well. Advised pt to switch to vaseline for approximately one more week before stopping with bandage and ointment.  Will have pt follow up one more time in approximately one month at which time we can discuss her other chronic medical problems.

## 2020-02-09 ENCOUNTER — Ambulatory Visit: Payer: BC Managed Care – PPO | Admitting: Family Medicine

## 2020-02-09 NOTE — Progress Notes (Deleted)
    SUBJECTIVE:   CHIEF COMPLAINT / HPI:   HTN:   GERD:   Asthma?:   COVID: 11/20/2019  Trichomonas: did she take metronidazole?   PERTINENT  PMH / PSH: ***  OBJECTIVE:   There were no vitals taken for this visit.  ***  ASSESSMENT/PLAN:   No problem-specific Assessment & Plan notes found for this encounter.     Sandre Kitty, MD [facility]

## 2020-02-16 ENCOUNTER — Telehealth: Payer: Self-pay | Admitting: *Deleted

## 2020-02-16 MED ORDER — PANTOPRAZOLE SODIUM 20 MG PO TBEC: 20.0000 mg | DELAYED_RELEASE_TABLET | Freq: Every day | ORAL | 1 refills | Status: DC

## 2020-02-16 NOTE — Telephone Encounter (Signed)
Received fax for pantoprazole did not see active on current med list.[name], CMA

## 2020-02-16 NOTE — Telephone Encounter (Signed)
I looked through her chart.  It looks like Dr. Parke Simmers had started her on it a year ago. I don't see why it was discontinued.  I sent in a new prescription for her.

## 2020-02-22 NOTE — Progress Notes (Deleted)
    SUBJECTIVE:   CHIEF COMPLAINT / HPI:   HTN:   GERD: protonix.    Allergies: albuterol, claritin?   PERTINENT  PMH / PSH: ***  OBJECTIVE:   There were no vitals taken for this visit.  ***  ASSESSMENT/PLAN:   No problem-specific Assessment & Plan notes found for this encounter.     Sandre Kitty, MD [facility]

## 2020-02-23 ENCOUNTER — Ambulatory Visit: Payer: BC Managed Care – PPO | Admitting: Family Medicine

## 2020-03-01 ENCOUNTER — Ambulatory Visit: Payer: BC Managed Care – PPO | Admitting: Family Medicine

## 2020-03-02 NOTE — Progress Notes (Deleted)
    SUBJECTIVE:   CHIEF COMPLAINT / HPI:   Burn:   HTN:   GERD  covid and flu  PERTINENT  PMH / PSH: ***  OBJECTIVE:   There were no vitals taken for this visit.  ***  ASSESSMENT/PLAN:   No problem-specific Assessment & Plan notes found for this encounter.     Sandre Kitty, MD [facility]

## 2020-03-03 ENCOUNTER — Ambulatory Visit: Payer: BC Managed Care – PPO | Admitting: Family Medicine

## 2020-03-16 NOTE — Progress Notes (Signed)
    SUBJECTIVE:   CHIEF COMPLAINT / HPI:   GERD: Patient takes pantoprazole as needed.  She usually gets heartburn if she eats spicy foods.  Obesity: Patient is trying to decrease her caloric intake but this is difficult she states.  She currently works at [employer].  She is open to talking to a nutritionist.  Cramps/spasms: Patient gets leg cramps, mostly at night.  She will try to stretch them out to feel better.  She also has "back spasms" after a long day working in which she is on her feet all day.  Currently takes diclofenac which was prescribed by her foot doctor.    PERTINENT  PMH / PSH: Obesity, prediabetes  OBJECTIVE:   BP 118/72   Pulse 95   Ht 5\' 6"  (1.676 m)   Wt (!) 395 lb 3.2 oz (179.3 kg)   SpO2 99%   BMI 63.79 kg/m   General: Alert and oriented.  No acute distress.  Obese young female. HEENT: PERRLA, EOMI.  Moist mucosa. CV: Regular rate and rhythm, no murmurs. Pulmonary: Shortened inspiration..  No wheezes or crackles. MSK: Legs nontender to palpation.  Back nontender to palpation.  ASSESSMENT/PLAN:   Obesity Current BMI of 63.  Patient willing to talk with nutritionist.  Gave patient our nutritionist, , information and advised her to set up an appointment. We will get A1c today.  Gastroesophageal reflux disease Takes pantoprazole as needed.  Continue current treatment  Muscle spasm Patient having both muscle spasms of the lower back as well as cramping of the lower legs.  The 2 seem to be unrelated although difficult to tell.  Will check BMP and magnesium.  Prescribed Flexeril to use as needed.  Advised patient to only use after work before bed.     Wyona Almas, MD [facility]

## 2020-03-17 ENCOUNTER — Other Ambulatory Visit: Payer: Self-pay

## 2020-03-17 ENCOUNTER — Ambulatory Visit (INDEPENDENT_AMBULATORY_CARE_PROVIDER_SITE_OTHER): Payer: BC Managed Care – PPO | Admitting: Family Medicine

## 2020-03-17 ENCOUNTER — Encounter: Payer: Self-pay | Admitting: Family Medicine

## 2020-03-17 VITALS — BP 118/72 | HR 95 | Ht 66.0 in | Wt 395.2 lb

## 2020-03-17 DIAGNOSIS — R7303 Prediabetes: Secondary | ICD-10-CM | POA: Diagnosis not present

## 2020-03-17 DIAGNOSIS — M62838 Other muscle spasm: Secondary | ICD-10-CM | POA: Diagnosis not present

## 2020-03-17 DIAGNOSIS — K219 Gastro-esophageal reflux disease without esophagitis: Secondary | ICD-10-CM

## 2020-03-17 DIAGNOSIS — Z6841 Body Mass Index (BMI) 40.0 and over, adult: Secondary | ICD-10-CM

## 2020-03-17 LAB — POCT GLYCOSYLATED HEMOGLOBIN (HGB A1C): Hemoglobin A1C: 5.5 % (ref 4.0–5.6)

## 2020-03-17 MED ORDER — CYCLOBENZAPRINE HCL 5 MG PO TABS: 5.0000 mg | ORAL_TABLET | Freq: Three times a day (TID) | ORAL | 0 refills | Status: DC | PRN

## 2020-03-17 NOTE — Patient Instructions (Addendum)
It was nice to see you again today,  I have prescribed you a medication to help with the back spasms.  Do not take this medication if you are working or driving a vehicle.  It can make you drowsy.  Dr. Gerilyn Pilgrim is our nutritionist and I have provided you with her phone number and email address.  You can call her at your convenience to schedule a appointment to discuss healthy eating habits.  I will call you with the results of the lab test when I get them.  I would like to see you back in 3 months or sooner if you need to.  Have a great day,  Frederic Jericho, MD  Wyona Almas: [phone]

## 2020-03-17 NOTE — Progress Notes (Deleted)
She takes pantoprazole,   Back spasms.

## 2020-03-18 DIAGNOSIS — R252 Cramp and spasm: Secondary | ICD-10-CM | POA: Insufficient documentation

## 2020-03-18 DIAGNOSIS — M62838 Other muscle spasm: Secondary | ICD-10-CM | POA: Insufficient documentation

## 2020-03-18 LAB — BASIC METABOLIC PANEL
BUN/Creatinine Ratio: 15 (ref 9–23)
BUN: 12 mg/dL (ref 6–20)
CO2: 24 mmol/L (ref 20–29)
Calcium: 9 mg/dL (ref 8.7–10.2)
Chloride: 99 mmol/L (ref 96–106)
Creatinine, Ser: 0.81 mg/dL (ref 0.57–1.00)
GFR calc Af Amer: 110 mL/min/{1.73_m2} (ref >59)
GFR calc non Af Amer: 95 mL/min/{1.73_m2} (ref >59)
Glucose: 85 mg/dL (ref 65–99)
Potassium: 4.3 mmol/L (ref 3.5–5.2)
Sodium: 139 mmol/L (ref 134–144)

## 2020-03-18 LAB — MAGNESIUM: Magnesium: 1.8 mg/dL (ref 1.6–2.3)

## 2020-03-18 NOTE — Assessment & Plan Note (Signed)
Patient having both muscle spasms of the lower back as well as cramping of the lower legs.  The 2 seem to be unrelated although difficult to tell.  Will check BMP and magnesium.  Prescribed Flexeril to use as needed.  Advised patient to only use after work before bed.

## 2020-03-18 NOTE — Assessment & Plan Note (Signed)
Current BMI of 63.  Patient willing to talk with nutritionist.  Gave patient our nutritionist, Wyona Almas, information and advised her to set up an appointment. We will get A1c today.

## 2020-03-18 NOTE — Assessment & Plan Note (Signed)
Takes pantoprazole as needed.  Continue current treatment

## 2020-04-06 ENCOUNTER — Other Ambulatory Visit: Payer: Self-pay

## 2020-04-06 MED ORDER — NAPROXEN 375 MG PO TABS
375.0000 mg | ORAL_TABLET | Freq: Two times a day (BID) | ORAL | 0 refills | Status: DC
Start: 2020-04-06 — End: 2022-04-12

## 2020-04-07 ENCOUNTER — Other Ambulatory Visit: Payer: Self-pay

## 2020-04-07 ENCOUNTER — Encounter: Payer: Self-pay | Admitting: Family Medicine

## 2020-04-07 ENCOUNTER — Ambulatory Visit (INDEPENDENT_AMBULATORY_CARE_PROVIDER_SITE_OTHER): Payer: BC Managed Care – PPO | Admitting: Family Medicine

## 2020-04-07 DIAGNOSIS — R109 Unspecified abdominal pain: Secondary | ICD-10-CM | POA: Diagnosis not present

## 2020-04-07 NOTE — Progress Notes (Signed)
    SUBJECTIVE:   CHIEF COMPLAINT / HPI:   BACK PAIN  Back pain began 2 days ago. Pain is described as catchy sharp pain in R flank area.  Similar to what had in September when went to ER. Patient has tried aleve which helped some but thinks may have caused itching.No shortness of breath or rash. Pain radiates no. History of trauma or injury: no Patient believes might be causing their pain: similar to chest wall pain had in past No shortness of breath no pain with breathing but pain with moving No rash No dysuria or frequency  History of cancer: no Weak immune system:  no History of IV drug use: no History of steroid use: no    PERTINENT  PMH / PSH: Hypertension, GERD, Protonix as needed, Naproxen, History of covid in August  OBJECTIVE:   BP 122/74   Pulse (!) 42   Wt (!) 399 lb 9.6 oz (181.3 kg)   SpO2 96%   BMI 64.50 kg/m   Alert nad Heart - Regular rate and rhythm.  No murmurs, gallops or rubs.    Lungs:  Normal respiratory effort, chest expands symmetrically. Lungs are clear to auscultation, no crackles or wheezes. Breath sounds distant due to habitus No pain with inspiration Extrem - no calf tenderness no pain with moving Flank - tender diffusely mid posterior flank.  No CVAT  ASSESSMENT/PLAN:   Acute right flank pain Most consistent with musculoskeletal pain.  No symptoms of UTI or PE or shingles or pneumonia.  Recommend naproxen and tylenol as needed.  See after visit summary for cautions. Recommend if naproxen causes itching to stop and use tylenol      Carney Living, MD [facility]

## 2020-04-07 NOTE — Assessment & Plan Note (Signed)
Most consistent with musculoskeletal pain.  No symptoms of UTI or PE or shingles or pneumonia.  Recommend naproxen and tylenol as needed.  See after visit summary for cautions. Recommend if naproxen causes itching to stop and use tylenol

## 2020-04-07 NOTE — Patient Instructions (Signed)
Good to see you today!  Thanks for coming in.  I think you have a muscle spasm or nerve irritation in your back  Take the Naproxen and use a heating pad.  Can also take tylenol  If not better in 2 weeks if if worsening a lot or shortness of breath or fever or leg swelling or cough then call us  Do not lift heavy objects but keep active   Take the protonix as needed for abdomen irritation from the naprosyn

## 2020-04-28 ENCOUNTER — Other Ambulatory Visit: Payer: Self-pay | Admitting: Podiatry

## 2020-04-29 NOTE — Telephone Encounter (Signed)
Please advise 

## 2020-07-06 ENCOUNTER — Ambulatory Visit: Payer: BC Managed Care – PPO | Admitting: Family Medicine

## 2020-07-06 NOTE — Progress Notes (Deleted)
    SUBJECTIVE:   CHIEF COMPLAINT / HPI:   Vomiting/diarrhea:  Obesity: has she talked to dr. Gerilyn Pilgrim?   PERTINENT  PMH / PSH: ***  OBJECTIVE:   There were no vitals taken for this visit.  ***  ASSESSMENT/PLAN:   No problem-specific Assessment & Plan notes found for this encounter.     Sandre Kitty, MD [facility]   {    This will disappear when note is signed, click to select method of visit    :1}

## 2020-07-08 ENCOUNTER — Ambulatory Visit (INDEPENDENT_AMBULATORY_CARE_PROVIDER_SITE_OTHER): Payer: BC Managed Care – PPO | Admitting: Family Medicine

## 2020-07-08 ENCOUNTER — Other Ambulatory Visit: Payer: Self-pay

## 2020-07-08 VITALS — BP 124/77 | HR 88 | Ht 66.0 in | Wt 388.2 lb

## 2020-07-08 DIAGNOSIS — M7521 Bicipital tendinitis, right shoulder: Secondary | ICD-10-CM

## 2020-07-08 DIAGNOSIS — R109 Unspecified abdominal pain: Secondary | ICD-10-CM

## 2020-07-08 DIAGNOSIS — R1013 Epigastric pain: Secondary | ICD-10-CM

## 2020-07-08 LAB — POCT URINE PREGNANCY: Preg Test, Ur: NEGATIVE

## 2020-07-08 NOTE — Progress Notes (Signed)
    SUBJECTIVE:   CHIEF COMPLAINT / HPI:   Abdominal pain: Patient had epigastric pain every day that "comes and goes".  When she eats she states she gets a pain in her stomach that lasts a few seconds.  She usually has to use the bathroom right after eating.  This is with most meals.  Her abdominal pain is never more than a few seconds at a time.  Her stools are soft and loose.  She takes Protonix as needed and states this helps with the abdominal pain.  She last took it on Saturday.  She takes Tylenol but not any NSAIDs.  She has not had any changes to her diet recently.  Currently no vomiting or diarrhea, but did have some last week.  Her niece had similar symptoms at that time.  Epigastric pain predates the vomiting and diarrhea last week.  Right shoulder pain: Patient states her right shoulder is sore.  She lifts things as part of her job at [employer].  This is been ongoing for a few weeks.  Takes Tylenol but nothing else for the pain.  No numbness tingling or weakness.  PERTINENT  PMH / PSH: Obesity  OBJECTIVE:   BP 124/77   Pulse 88   Ht 5\' 6"  (1.676 m)   Wt (!) 388 lb 3.2 oz (176.1 kg)   SpO2 (!) 88%   BMI 62.66 kg/m   General: Alert and oriented.  No acute distress. CV: Regular rate and rhythm, no murmurs Pulmonary: Lungs clear to auscultation bilaterally GI: Obese pannus, nontender to palpation.  Normal bowel sounds. MSK: Normal empty can test bilaterally, normal Hawkins test bilaterally, positive Yergason's test of the right arm  ASSESSMENT/PLAN:   Epigastric abdominal pain Epigastric pain, lasting a few seconds, triggered by food, improved with Protonix.  Differential includes GERD, dyspepsia, possibly gallstone disease.  Less likely to be pancreatitis given the short duration of symptoms.  May also be irritable bowel given her description of loose stools and having to go after eating.  Advised patient to tak Protonix every day scheduled and follow-up in 3 weeks.  Advised  patient to keep a food diary and diary of when her symptoms occur.  Advised to bring this to next visit.  Biceps tendinitis of right upper extremity Positive Yergason test.  Differential also includes rotator cuff inflammation.  Advised rest, ice or heat.  Avoid NSAIDs given her complaints of epigastric pain and possible peptic ulcer.  Sent in referral to sports medicine for further evaluation and treatment.     , MD [facility]

## 2020-07-08 NOTE — Patient Instructions (Signed)
It was nice to see you today,  I would like you to take the Protonix once a day every day.  For the next 3 weeks and to see me again.    I would also like you to take a diary of what you eat during the day and when you have the belly pain and bowel movements.  I many get some blood test today, I will discuss the results with you when I get them or at our next visit.  I will refer you to sports medicine for your shoulder complaints.  Until that time you should not lift anything heavy, use heat or ice on the bicep and shoulder, take Tylenol as needed and you can rub Voltaren gel which you can get over-the-counter on it up to 4 times a day.    Today,  Frederic Jericho, MD

## 2020-07-09 LAB — COMPREHENSIVE METABOLIC PANEL
ALT: 15 IU/L (ref 0–32)
AST: 14 IU/L (ref 0–40)
Albumin/Globulin Ratio: 1.5 (ref 1.2–2.2)
Albumin: 4.4 g/dL (ref 3.8–4.8)
Alkaline Phosphatase: 52 IU/L (ref 44–121)
BUN/Creatinine Ratio: 13 (ref 9–23)
BUN: 10 mg/dL (ref 6–20)
Bilirubin Total: 0.4 mg/dL (ref 0.0–1.2)
CO2: 23 mmol/L (ref 20–29)
Calcium: 8.8 mg/dL (ref 8.7–10.2)
Chloride: 103 mmol/L (ref 96–106)
Creatinine, Ser: 0.79 mg/dL (ref 0.57–1.00)
Globulin, Total: 3 g/dL (ref 1.5–4.5)
Glucose: 83 mg/dL (ref 65–99)
Potassium: 4.1 mmol/L (ref 3.5–5.2)
Sodium: 142 mmol/L (ref 134–144)
Total Protein: 7.4 g/dL (ref 6.0–8.5)
eGFR: 101 mL/min/{1.73_m2} (ref >59)

## 2020-07-09 LAB — LIPASE: Lipase: 15 U/L (ref 14–72)

## 2020-07-10 DIAGNOSIS — M7521 Bicipital tendinitis, right shoulder: Secondary | ICD-10-CM | POA: Insufficient documentation

## 2020-07-10 NOTE — Assessment & Plan Note (Deleted)
Epigastric pain, lasting a few seconds, triggered by food, improved with Protonix.  Differential includes GERD, dyspepsia, possibly gallstone disease.  Less likely to be pancreatitis given the short duration of symptoms.  May also be irritable bowel given her description of loose stools and having to go after eating.  Advised patient to tak Protonix every day scheduled and follow-up in 3 weeks.  Advised patient to keep a food diary and diary of when her symptoms occur.  Advised to bring this to next visit.

## 2020-07-10 NOTE — Assessment & Plan Note (Signed)
Positive Yergason test.  Differential also includes rotator cuff inflammation.  Advised rest, ice or heat.  Avoid NSAIDs given her complaints of epigastric pain and possible peptic ulcer.  Sent in referral to sports medicine for further evaluation and treatment.

## 2020-07-10 NOTE — Assessment & Plan Note (Signed)
Epigastric pain, lasting a few seconds, triggered by food, improved with Protonix.  Differential includes GERD, dyspepsia, possibly gallstone disease.  Less likely to be pancreatitis given the short duration of symptoms.  May also be irritable bowel given her description of loose stools and having to go after eating.  Advised patient to tak Protonix every day scheduled and follow-up in 3 weeks.  Advised patient to keep a food diary and diary of when her symptoms occur.  Advised to bring this to next visit. 

## 2020-07-26 ENCOUNTER — Other Ambulatory Visit: Payer: Self-pay

## 2020-07-26 ENCOUNTER — Encounter: Payer: BC Managed Care – PPO | Attending: Physician Assistant | Admitting: Physician Assistant

## 2020-07-26 DIAGNOSIS — L98492 Non-pressure chronic ulcer of skin of other sites with fat layer exposed: Secondary | ICD-10-CM | POA: Diagnosis not present

## 2020-07-28 NOTE — Progress Notes (Signed)
RAYLEA, ADCOX ([id number]) Visit Report for 07/26/2020 Abuse/Suicide Risk Screen Details Patient Name: [name], [name]. Date of Service: 07/26/2020 1:00 PM Medical Record Number: [id number] Patient Account Number: [id number] Date of Birth/Sex: 1985/12/12 (34 y.o. F) Treating RN: Hansel Feinstein Primary Care Ghislaine Harcum: Lenor Coffin Other Clinician: Lolita Cram Referring Lavonn Maxcy: Referral, Self Treating Kadian Barcellos/Extender: Rowan Blase in Treatment: 0 Abuse/Suicide Risk Screen Items Answer ABUSE RISK SCREEN: Has anyone close to you tried to hurt or harm you recentlyo No Do you feel uncomfortable with anyone in your familyo No Has anyone forced you do things that you didnot want to doo No Electronic Signature(s) Signed: 07/28/2020 7:52:35 AM By: Hansel Feinstein Entered By: Hansel Feinstein on 07/26/2020 13:37:44 Adinolfi, [name] ([id number]) -------------------------------------------------------------------------------- Activities of Daily Living Details Patient Name: [name], [name]. Date of Service: 07/26/2020 1:00 PM Medical Record Number: [id number] Patient Account Number: [id number] Date of Birth/Sex: 1986-01-16 (34 y.o. F) Treating RN: Hansel Feinstein Primary Care Yesennia Hirota: Lenor Coffin Other Clinician: Lolita Cram Referring Kharisma Glasner: Referral, Self Treating Shayonna Ocampo/Extender: Rowan Blase in Treatment: 0 Activities of Daily Living Items Answer Activities of Daily Living (Please select one for each item) Drive Automobile Completely Able Take Medications Completely Able Use Telephone Completely Able Care for Appearance Completely Able Use Toilet Completely Able Bath / Shower Completely Able Dress Self Completely Able Feed Self Completely Able Walk Completely Able Get In / Out Bed Completely Able Housework Completely Able Prepare Meals Completely Able Handle Money Completely Able Shop for Self Completely Able Electronic Signature(s) Signed: 07/28/2020 7:52:35 AM  By: Hansel Feinstein Entered By: Hansel Feinstein on 07/26/2020 13:38:09 Karner, [name] ([id number]) -------------------------------------------------------------------------------- Education Screening Details Patient Name: [name], [name] Date of Service: 07/26/2020 1:00 PM Medical Record Number: [id number] Patient Account Number: [id number] Date of Birth/Sex: 09-19-1985 (34 y.o. F) Treating RN: Hansel Feinstein Primary Care Crixus Mcaulay: Lenor Coffin Other Clinician: Lolita Cram Referring Cierra Rothgeb: Referral, Self Treating [place] Cohick/Extender: Rowan Blase in Treatment: 0 Primary Learner Assessed: Patient Learning Preferences/Education Level/Primary Language Learning Preference: Explanation Highest Education Level: High School Preferred Language: English Cognitive Barrier Language Barrier: No Translator Needed: No Memory Deficit: No Emotional Barrier: No Cultural/Religious Beliefs Affecting Medical Care: No Physical Barrier Impaired Vision: No Impaired Hearing: No Decreased Hand dexterity: No Knowledge/Comprehension Knowledge Level: High Comprehension Level: High Ability to understand written instructions: High Ability to understand verbal instructions: High Motivation Anxiety Level: Calm Cooperation: Cooperative Education Importance: Acknowledges Need Interest in Health Problems: Asks Questions Perception: Coherent Willingness to Engage in Self-Management High Activities: Readiness to Engage in Self-Management High Activities: Electronic Signature(s) Signed: 07/28/2020 7:52:35 AM By: Hansel Feinstein Entered By: Hansel Feinstein on 07/26/2020 13:38:32 Berninger, [name] ([id number]) -------------------------------------------------------------------------------- Fall Risk Assessment Details Patient Name: [name] Date of Service: 07/26/2020 1:00 PM Medical Record Number: [id number] Patient Account Number: [id number] Date of Birth/Sex: 1986/03/19 (34 y.o. F) Treating RN:  Hansel Feinstein Primary Care Jeily Guthridge: Lenor Coffin Other Clinician: Lolita Cram Referring Kiarra Kidd: Referral, Self Treating Jameal [name]/Extender: Rowan Blase in Treatment: 0 Fall Risk Assessment Items Have you had 2 or more falls in the last 12 monthso 0 No Have you had any fall that resulted in injury in the last 12 monthso 0 No FALLS RISK SCREEN History of falling - immediate or within 3 months 0 No Secondary diagnosis (Do you have 2 or more medical diagnoseso) 0 No Ambulatory aid None/bed rest/wheelchair/nurse 0 Yes Crutches/cane/walker 0 No Furniture 0 No Intravenous therapy Access/Saline/Heparin Lock 0 No Gait/Transferring Normal/ bed rest/ wheelchair 0 No  Weak (short steps with or without shuffle, stooped but able to lift head while walking, may 0 No seek support from furniture) Impaired (short steps with shuffle, may have difficulty arising from chair, head down, impaired 0 No balance) Mental Status Oriented to own ability 0 Yes Electronic Signature(s) Signed: 07/28/2020 7:52:35 AM By: Hansel Feinstein Entered By: Hansel Feinstein on 07/26/2020 13:38:49 Piatek, [name] ([id number]) -------------------------------------------------------------------------------- Foot Assessment Details Patient Name: [name], [name]. Date of Service: 07/26/2020 1:00 PM Medical Record Number: [id number] Patient Account Number: [id number] Date of Birth/Sex: [birth year] (34 y.o. F) Treating RN: Hansel Feinstein Primary Care Doyne Ellinger: Lenor Coffin Other Clinician: Lolita Cram Referring [place] Rosamond: Referral, Self Treating Ashaunte Standley/Extender: Rowan Blase in Treatment: 0 Foot Assessment Items Site Locations + = Sensation present, - = Sensation absent, C = Callus, U = Ulcer R = Redness, W = Warmth, M = Maceration, PU = Pre-ulcerative lesion F = Fissure, S = Swelling, D = Dryness Assessment Right: Left: Other Deformity: No No Prior Foot Ulcer: No No Prior Amputation: No No Charcot Joint:  No No Ambulatory Status: Ambulatory Without Help Gait: Steady Electronic Signature(s) Signed: 07/28/2020 7:52:35 AM By: Hansel Feinstein Entered By: Hansel Feinstein on 07/26/2020 13:39:32 Norberto, [name] ([id number]) -------------------------------------------------------------------------------- Nutrition Risk Screening Details Patient Name: [name], [name]. Date of Service: 07/26/2020 1:00 PM Medical Record Number: [id number] Patient Account Number: [id number] Date of Birth/Sex: 1986-01-18 (34 y.o. F) Treating RN: Hansel Feinstein Primary Care Lesleigh Hughson: Lenor Coffin Other Clinician: Lolita Cram Referring Chestina Komatsu: Referral, Self Treating Brantleigh Mifflin/Extender: Rowan Blase in Treatment: 0 Height (in): 66 Weight (lbs): 388 Body Mass Index (BMI): 62.6 Nutrition Risk Screening Items Score Screening NUTRITION RISK SCREEN: I have an illness or condition that made me change the kind and/or amount of food I eat 0 No I eat fewer than two meals per day 0 No I eat few fruits and vegetables, or milk products 0 No I have three or more drinks of beer, liquor or wine almost every day 0 No I have tooth or mouth problems that make it hard for me to eat 0 No I don't always have enough money to buy the food I need 0 No I eat alone most of the time 0 No I take three or more different prescribed or over-the-counter drugs a day 0 No Without wanting to, I have lost or gained 10 pounds in the last six months 0 No I am not always physically able to shop, cook and/or feed myself 0 No Nutrition Protocols Good Risk Protocol 0 No interventions needed Moderate Risk Protocol High Risk Proctocol Risk Level: Good Risk Score: 0 Electronic Signature(s) Signed: 07/28/2020 7:52:35 AM By: Hansel Feinstein Entered ByHansel Feinstein on 07/26/2020 13:39:09

## 2020-07-28 NOTE — Progress Notes (Signed)
[name], [name] ([id number]) Visit Report for 07/26/2020 Chief Complaint Document Details Patient Name: [name], [name]. Date of Service: 07/26/2020 1:00 PM Medical Record Number: [id number] Patient Account Number: [id number] Date of Birth/Sex: Oct 16, 1985 (35 y.o. F) Treating RN: Rogers Blocker Primary Care Provider: Lenor Coffin Other Clinician: Lolita Cram Referring Provider: Referral, Self Treating Provider/Extender: Rowan Blase in Treatment: 0 Information Obtained from: Patient Chief Complaint Left LE Ulcer Electronic Signature(s) Signed: 07/26/2020 2:06:13 PM By: Lenda Kelp PA-C Entered By: Lenda Kelp on 07/26/2020 14:06:12 Guerry, [name] ([id number]) -------------------------------------------------------------------------------- HPI Details Patient Name: [name], [name] Date of Service: 07/26/2020 1:00 PM Medical Record Number: [id number] Patient Account Number: [id number] Date of Birth/Sex: Jun 30, 1985 (35 y.o. F) Treating RN: Rogers Blocker Primary Care Provider: Lenor Coffin Other Clinician: Lolita Cram Referring Provider: Referral, Self Treating Provider/Extender: Rowan Blase in Treatment: 0 History of Present Illness HPI Description: 35 year old patient who started with having ulcerations on the right lower leg on the lateral part of her ankle for about 2 weeks. She was seen in the ER at [facility] and advised to see the wound care for a consultation. No X-rays of workup was done during the ER visit and no prescription for any medications of compression wraps were given. the patient is not diabetic but does have hypertension and her medications have been reviewed by me. 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 The right and left greater saphenous vein was found to be  tortuous. Deep venous system was also not competent and there was reflux of more than 500 ms. She was then seen by Dr. Tawanna Cooler Early who recommended that the patient would not benefit from endovenous ablation and he had recommended vein stripping on the right side and multiple small phlebectomy procedures on the left side. the patient did not follow-up due to social economic reasons. She has not been wearing any compression stockings and has not taken any specific treatment for varicose veins for the last 3 years. 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 We have obtained some appointments to see the vascular surgeons by the end of the month and the patient would like to follow up with me at my [facility] on Wednesday, June 29. 10/14/2014 -- she could not see me yesterday in [place] and hence has come for a review today. She has a vascular workup to be done this afternoon at [facility]. She is doing fine otherwise. 10/22/2014 -- she was seen by Dr. [name] and he has recommended surgical removal of her right saphenous vein from distal thigh to saphenofemoral junction and stab phlebectomy's of multiple large tributary branches throughout her thigh and calf. This would be done under general anesthesia in the outpatient setting. 10/29/2014 -- she is trying to work on a surgical date and in the meanwhile we have got insurance clearance for Apligraf and we will start this next week. 7/22 2016 -- she is here for the first application of Apligraf. 11/19/2014 -- she is here for a second application of Apligraf 11/26/2014 -- she has done fine after her last application of Apligraf and is awaiting her surgery which is scheduled for August 31. 12/03/2014 -- she is doing fine and is here  for her third application of Apligraf. 12/21/2014 -- She had surgery on 12/15/2014 by Dr.Early who did #1  ligation and stripping of right great saphenous vein from distal thigh to saphenofemoral junction, #2 stab phlebectomy of large tributary varicose veins in the thigh popliteal space and calf. She had an Ace wrap up to her groin and this was removed today and the Unna's boot was also removed. 12/28/2014 -- she is here for her fourth application of Apligraf. 01/06/2015 - he saw her vascular surgeon Dr. Arbie Cookey who was pleased with her progress and he has confirmed that no surgical procedures could be attempted on the left side. 01/13/2015 -- her wound looks very good and she's been having no problems whatsoever. Readmission: 07/26/2020 upon evaluation today patient presents for initial inspection here in our clinic for a new issue with her left leg although she is previously been seen due to issues with the right leg back in 2016. At that time she was seeing Dr. Arbie Cookey who is a vein/vascular specialist in [place]. He has since semiretired from what I understand. He is working out of [employer] I believe. Nonetheless she tells me at the time that there was really nothing to do for her left leg although the right leg was where they did most of the work. Subsequently she states that she is done fairly well until just in the past week where she had issues with bleeding from what appears to be varicose vein on the left leg medially. Unfortunately this has continued to be an issue although she tells me at first it was coming much more significantly Down quite a bit but nonetheless has not completely resolved. Every time she showers she notices that it starts to drain a little bit more. She does have a history of chronic venous insufficiency, lymphedema, varicose veins bilaterally, and obesity. Electronic Signature(s) Signed: 07/26/2020 3:05:18 PM By: Lenda Kelp PA-C Entered By: Lenda Kelp on 07/26/2020 15:05:18 Stonebraker, [name]  ([id number]) -------------------------------------------------------------------------------- Physical Exam Details Patient Name: [name], [name]. Date of Service: 07/26/2020 1:00 PM Medical Record Number: [id number] Patient Account Number: [id number] Date of Birth/Sex: 04-Sep-1985 (35 y.o. F) Treating RN: Rogers Blocker Primary Care Provider: Lenor Coffin Other Clinician: Lolita Cram Referring Provider: Referral, Self Treating Provider/Extender: Rowan Blase in Treatment: 0 Constitutional sitting or standing blood pressure is within target range for patient.. pulse regular and within target range for patient.Marland Kitchen respirations regular, non- labored and within target range for patient.Marland Kitchen temperature within target range for patient.. Obese and well-hydrated in no acute distress. Eyes conjunctiva clear no eyelid edema noted. pupils equal round and reactive to light and accommodation. Ears, Nose, Mouth, and Throat no gross abnormality of ear auricles or external auditory canals. normal hearing noted during conversation. mucus membranes moist. Respiratory normal breathing without difficulty. Cardiovascular 2+ dorsalis pedis/posterior tibialis pulses. Patient has bilateral lower extremity lymphedema with significant varicose veins noted.. Musculoskeletal normal gait and posture. no significant deformity or arthritic changes, no loss or range of motion, no clubbing. Psychiatric this patient is able to make decisions and demonstrates good insight into disease process. Alert and Oriented x 3. pleasant and cooperative. Notes Upon inspection the patient does not have a large wound but rather what appears to be an area where a varicose vein has ruptured and subsequently seems to be doing okay right now although obviously I can see where it has been draining. This unfortunately is going to continue to possibly be an issue if we can get  some of this under control I think that we may be able to  wrap her to try to cut back on some of the issues here as far as the swelling and even the protrusions of the varicose veins are concerned. Hopefully this will help this to seal up and quit bleeding. Is not actively bleeding at the moment. Electronic Signature(s) Signed: 07/26/2020 3:07:00 PM By: Lenda Kelp PA-C Entered By: Lenda Kelp on 07/26/2020 15:07:00 Gell, [name] ([id number]) -------------------------------------------------------------------------------- Physician Orders Details Patient Name: ARBUTUS, NELLIGAN Date of Service: 07/26/2020 1:00 PM Medical Record Number: [id number] Patient Account Number: [id number] Date of Birth/Sex: 03-Aug-1985 (35 y.o. F) Treating RN: Rogers Blocker Primary Care Provider: Lenor Coffin Other Clinician: Lolita Cram Referring Provider: Referral, Self Treating Provider/Extender: Rowan Blase in Treatment: 0 Verbal / Phone Orders: No Diagnosis Coding ICD-10 Coding Code Description I87.2 Venous insufficiency (chronic) (peripheral) I89.0 Lymphedema, not elsewhere classified L98.492 Non-pressure chronic ulcer of skin of other sites with fat layer exposed E66.01 Morbid (severe) obesity due to excess calories Follow-up Appointments o Return Appointment in 1 week. Bathing/ Shower/ Hygiene o Wash wounds with antibacterial soap and water. Edema Control - Lymphedema / Segmental Compressive Device / Other Left Lower Extremity o Optional: One layer of unna paste to top of compression wrap (to act as an anchor). o 4 Layer Compression System (Left, Right, Bilateral) Lymphedema. Wound Treatment Wound #3 - Lower Leg Wound Laterality: Left, Medial, Distal Cleanser: Soap and Water 1 x Per Week/15 Days Discharge Instructions: Gently cleanse wound with antibacterial soap, rinse and pat dry prior to dressing wounds Primary Dressing: Xtrasorb Classic Super Absorbent Dressing, 4x5 (in/in) 1 x Per Week/15 Days Compression Wrap: Profore  LF 4 Multi-Layer Compression Bandaging System 1 x Per Week/15 Days Discharge Instructions: Apply 4 multi-layer wrap as directed. Consults o Vascular - [facility]-consult Electronic Signature(s) Signed: 07/26/2020 4:59:45 PM By: Phillis Haggis, Dondra Prader RN Signed: 07/26/2020 6:19:37 PM By: Lenda Kelp PA-C Entered By: Phillis Haggis, Dondra Prader on 07/26/2020 14:17:35 Carranza, [name] -------------------------------------------------------------------------------- Problem List Details Patient Name: MARISKA, DAFFIN. Date of Service: 07/26/2020 1:00 PM Medical Record Number: [id number] Patient Account Number: [id number] Date of Birth/Sex: [birth year] (35 y.o. F) Treating RN: Rogers Blocker Primary Care Provider: Lenor Coffin Other Clinician: Lolita Cram Referring Provider: Referral, Self Treating Provider/Extender: Rowan Blase in Treatment: 0 Active Problems ICD-10 Encounter Code Description Active Date MDM Diagnosis I87.2 Venous insufficiency (chronic) (peripheral) 07/26/2020 No Yes I89.0 Lymphedema, not elsewhere classified 07/26/2020 No Yes I83.893 Varicose veins of bilateral lower extremities with other complications 07/26/2020 No Yes L98.492 Non-pressure chronic ulcer of skin of other sites with fat layer exposed 07/26/2020 No Yes E66.01 Morbid (severe) obesity due to excess calories 07/26/2020 No Yes Inactive Problems Resolved Problems Electronic Signature(s) Signed: 07/26/2020 3:06:00 PM By: Lenda Kelp PA-C Previous Signature: 07/26/2020 2:05:57 PM Version By: Lenda Kelp PA-C Entered By: Lenda Kelp on 07/26/2020 15:06:00 Devenport, [name] ([id number]) -------------------------------------------------------------------------------- Progress Note Details Patient Name: [name] Date of Service: 07/26/2020 1:00 PM Medical Record Number: [id number] Patient Account Number: [id number] Date of Birth/Sex: [birth year] (35 y.o. F) Treating RN:  Rogers Blocker Primary Care Provider: Lenor Coffin Other Clinician: Lolita Cram Referring Provider: Referral, Self Treating Provider/Extender: Rowan Blase in Treatment: 0 Subjective Chief Complaint Information obtained from Patient Left LE Ulcer History of Present Illness (HPI) 35 year old patient who started with having ulcerations on the right lower leg on the lateral part of her ankle  for about 2 weeks. She was seen in the ER at [facility] and advised to see the wound care for a consultation. No X-rays of workup was done during the ER visit and no prescription for any medications of compression wraps were given. the patient is not diabetic but does have hypertension and her medications have been reviewed by me. 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 The right and left greater saphenous vein was found to be tortuous. Deep venous system was also not competent and there was reflux of more than 500 ms. She was then seen by Dr. Tawanna Cooler Early who recommended that the patient would not benefit from endovenous ablation and he had recommended vein stripping on the right side and multiple small phlebectomy procedures on the left side. the patient did not follow-up due to social economic reasons. She has not been wearing any compression stockings and has not taken any specific treatment for varicose veins for the last 3 years. 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 We have obtained some appointments to see the vascular surgeons by the end of the month and the patient would like to follow up with me at my [facility] on Wednesday, June 29. 10/14/2014 -- she could not see me yesterday in [place] and hence has  come for a review today. She has a vascular workup to be done this afternoon at [facility]. She is doing fine otherwise. 10/22/2014 -- she was seen by Dr. [name] and he has recommended surgical removal of her right saphenous vein from distal thigh to saphenofemoral junction and stab phlebectomy's of multiple large tributary branches throughout her thigh and calf. This would be done under general anesthesia in the outpatient setting. 10/29/2014 -- she is trying to work on a surgical date and in the meanwhile we have got insurance clearance for Apligraf and we will start this next week. 7/22 2016 -- she is here for the first application of Apligraf. 11/19/2014 -- she is here for a second application of Apligraf 11/26/2014 -- she has done fine after her last application of Apligraf and is awaiting her surgery which is scheduled for August 31. 12/03/2014 -- she is doing fine and is here for her third application of Apligraf. 12/21/2014 -- She had surgery on 12/15/2014 by Dr.Early who did #1 ligation and stripping of right great saphenous vein from distal thigh to saphenofemoral junction, #2 stab phlebectomy of large tributary varicose veins in the thigh popliteal space and calf. She had an Ace wrap up to her groin and this was removed today and the Unna's boot was also removed. 12/28/2014 -- she is here for her fourth application of Apligraf. 01/06/2015 - he saw her vascular surgeon Dr. Arbie Cookey who was pleased with her progress and he has confirmed that no surgical procedures could be attempted on the left side. 01/13/2015 -- her wound looks very good and she's been having no problems whatsoever. Readmission: 07/26/2020 upon evaluation today patient presents for initial inspection here in our clinic for a new issue with her left leg although she is previously been seen due to issues with the right leg back in 2016. At that time she was seeing Dr. Arbie Cookey who is a vein/vascular specialist  in  [place]. He has since semiretired from what I understand. He is working out of [employer] I believe. Nonetheless she tells me at the time that there was really nothing to do for her left leg although the right leg was where they did most of the work. Subsequently she states that she is done fairly well until just in the past week where she had issues with bleeding from what appears to be varicose vein on the left leg medially. Unfortunately this has continued to be an issue although she tells me at first it was coming much more significantly Down quite a bit but nonetheless has not completely resolved. Every time she showers she notices that it starts to drain a little bit more. She does have a history of chronic venous insufficiency, lymphedema, varicose veins bilaterally, and obesity. Patient History Information obtained from Patient. Allergies tramadol (Severity: Mild, Reaction: "felt weird"/doesn't tolerated) Social History Never smoker, Marital Status - Single, Alcohol Use - Rarely, Drug Use - Current History - marijuana, Caffeine Use - Rarely. MYEESHA, SHANE ([id number]) Medical History Cardiovascular Patient has history of Hypertension, Peripheral Venous Disease Hospitalization/Surgery History - umb hernia repair. - right lower leg vein surgery. Medical And Surgical History Notes Endocrine pre DM, A1C 5.5 12/21 Review of Systems (ROS) Constitutional Symptoms (General Health) Denies complaints or symptoms of Fatigue, Fever, Chills, Marked Weight Change. Eyes Denies complaints or symptoms of Dry Eyes, Vision Changes, Glasses / Contacts. Ear/Nose/Mouth/Throat Complains or has symptoms of Sinusitis. Hematologic/Lymphatic Denies complaints or symptoms of Bleeding / Clotting Disorders, Human Immunodeficiency Virus. Respiratory Complains or has symptoms of Shortness of Breath - post covid 2021 and 2022. Cardiovascular Complains or has symptoms of LE  edema. Gastrointestinal Complains or has symptoms of Frequent diarrhea - IBS s/s, Nausea - GERD. Denies complaints or symptoms of Vomiting. Genitourinary Denies complaints or symptoms of Kidney failure/ Dialysis, Incontinence/dribbling. Immunological Denies complaints or symptoms of Hives, Itching. Integumentary (Skin) Denies complaints or symptoms of Wounds, Bleeding or bruising tendency, Breakdown, Swelling. Musculoskeletal Denies complaints or symptoms of Muscle Pain, Muscle Weakness. Neurologic Denies complaints or symptoms of Numbness/parasthesias, Focal/Weakness. Objective Constitutional sitting or standing blood pressure is within target range for patient.. pulse regular and within target range for patient.Marland Kitchen respirations regular, non- labored and within target range for patient.Marland Kitchen temperature within target range for patient.. Obese and well-hydrated in no acute distress. Vitals Time Taken: 1:22 PM, Height: 66 in, Source: Stated, Weight: 388 lbs, Source: Stated, BMI: 62.6, Temperature: 98.3 F, Pulse: 88 bpm, Respiratory Rate: 16 breaths/min, Blood Pressure: 139/82 mmHg. Eyes conjunctiva clear no eyelid edema noted. pupils equal round and reactive to light and accommodation. Ears, Nose, Mouth, and Throat no gross abnormality of ear auricles or external auditory canals. normal hearing noted during conversation. mucus membranes moist. Respiratory normal breathing without difficulty. Cardiovascular 2+ dorsalis pedis/posterior tibialis pulses. Patient has bilateral lower extremity lymphedema with significant varicose veins noted.. Musculoskeletal normal gait and posture. no significant deformity or arthritic changes, no loss or range of motion, no clubbing. Psychiatric this patient is able to make decisions and demonstrates good insight into disease process. Alert and Oriented x 3. pleasant and cooperative. General Notes: Upon inspection the patient does not have a large wound but  rather what appears to be an area where a varicose vein has ruptured and subsequently seems to be doing okay right now although obviously I can see where it has been draining. This unfortunately is going to continue to possibly be an issue if we  can get some of this under control I think that we may be able to wrap her to try to cut back on some of Conley SimmondsSUMMERS, Meesha J. ([id number]) the issues here as far as the swelling and even the protrusions of the varicose veins are concerned. Hopefully this will help this to seal up and quit bleeding. Is not actively bleeding at the moment. Integumentary (Hair, Skin) Wound #3 status is Open. Original cause of wound was Gradually Appeared. The date acquired was: 07/18/2020. The wound is located on the Left,Distal,Medial Lower Leg. The wound measures 0.2cm length x 0.2cm width x 0.1cm depth; 0.031cm^2 area and 0.003cm^3 volume. There is no tunneling or undermining noted. There is a medium amount of sanguinous drainage noted. There is no granulation within the wound bed. There is no necrotic tissue within the wound bed. Assessment Active Problems ICD-10 Venous insufficiency (chronic) (peripheral) Lymphedema, not elsewhere classified Varicose veins of bilateral lower extremities with other complications Non-pressure chronic ulcer of skin of other sites with fat layer exposed Morbid (severe) obesity due to excess calories Procedures Wound #3 Pre-procedure diagnosis of Wound #3 is a Venous Leg Ulcer located on the Left,Distal,Medial Lower Leg . There was a Four Layer Compression Therapy Procedure with a pre-treatment ABI of 1.3 by Rogers BlockerSanchez, Kenia, RN. Post procedure Diagnosis Wound #3: Same as Pre-Procedure Plan Follow-up Appointments: Return Appointment in 1 week. Bathing/ Shower/ Hygiene: Wash wounds with antibacterial soap and water. Edema Control - Lymphedema / Segmental Compressive Device / Other: Optional: One layer of unna paste to top of compression  wrap (to act as an anchor). 4 Layer Compression System (Left, Right, Bilateral) Lymphedema. Consults ordered were: Vascular - [facility] [place]-consult WOUND #3: - Lower Leg Wound Laterality: Left, Medial, Distal Cleanser: Soap and Water 1 x Per Week/15 Days Discharge Instructions: Gently cleanse wound with antibacterial soap, rinse and pat dry prior to dressing wounds Primary Dressing: Xtrasorb Classic Super Absorbent Dressing, 4x5 (in/in) 1 x Per Week/15 Days Compression Wrap: Profore LF 4 Multi-Layer Compression Bandaging System 1 x Per Week/15 Days Discharge Instructions: Apply 4 multi-layer wrap as directed. 1. Would recommend currently that we go ahead and initiate treatment with a XtraSorb pad over top of the area where this has been leaking/bleeding where she has the varicose vein on the left medial lower extremity. 2. Also can recommend a 4-layer compression wrap to try to keep the varicose vein down hopefully prevent this from continuing to drain and leak as well as bleed as obviously we need to try to get this healed up for all possible. 3. I am also can make a recommendation for referral back to vein and vascular specialist in [place] to see if there is anything that can be done for the varicose veins try to prevent this from being a recurrent issue. We will see patient back for reevaluation in 1 week here in the clinic. If anything worsens or changes patient will contact our office for additional recommendations. Electronic Signature(s) Signed: 07/26/2020 3:08:48 PM By: Eula FlaxStone III, [place]e Agent PA-C Provencher, Chasya JMarland Kitchen. ([id number]) Entered By: Lenda KelpStone III, Abraham Margulies on 07/26/2020 15:08:48 Conley SimmondsSUMMERS, Levie J. ([id number]) -------------------------------------------------------------------------------- ROS/PFSH Details Patient Name: Conley SimmondsSUMMERS, Lorilyn J. Date of Service: 07/26/2020 1:00 PM Medical Record Number: [id number] Patient Account Number: [id number] Date of Birth/Sex: 09/29/1985 (34 y.o.  F) Treating RN: Hansel FeinsteinBishop, Joy Primary Care Provider: Lenor Coffinlson, Daniel Other Clinician: Lolita CramBurnette, Kyara Referring Provider: Referral, Self Treating Provider/Extender: Rowan BlaseStone, Cutler Sunday Weeks in Treatment: 0 Information Obtained From Patient Constitutional Symptoms (General Health) Complaints  and Symptoms: Negative for: Fatigue; Fever; Chills; Marked Weight Change Eyes Complaints and Symptoms: Negative for: Dry Eyes; Vision Changes; Glasses / Contacts Ear/Nose/Mouth/Throat Complaints and Symptoms: Positive for: Sinusitis Hematologic/Lymphatic Complaints and Symptoms: Negative for: Bleeding / Clotting Disorders; Human Immunodeficiency Virus Respiratory Complaints and Symptoms: Positive for: Shortness of Breath - post covid 2021 and 2022 Cardiovascular Complaints and Symptoms: Positive for: LE edema Medical History: Positive for: Hypertension; Peripheral Venous Disease Gastrointestinal Complaints and Symptoms: Positive for: Frequent diarrhea - IBS s/s; Nausea - GERD Negative for: Vomiting Genitourinary Complaints and Symptoms: Negative for: Kidney failure/ Dialysis; Incontinence/dribbling Immunological Complaints and Symptoms: Negative for: Hives; Itching Integumentary (Skin) Complaints and Symptoms: Negative for: Wounds; Bleeding or bruising tendency; Breakdown; Swelling Musculoskeletal NAELA, NODAL. ([id number]) Complaints and Symptoms: Negative for: Muscle Pain; Muscle Weakness Neurologic Complaints and Symptoms: Negative for: Numbness/parasthesias; Focal/Weakness Endocrine Medical History: Past Medical History Notes: pre DM, A1C 5.5 12/21 Oncologic Immunizations Pneumococcal Vaccine: Received Pneumococcal Vaccination: No Implantable Devices None Hospitalization / Surgery History Type of Hospitalization/Surgery umb hernia repair right lower leg vein surgery Family and Social History Never smoker; Marital Status - Single; Alcohol Use: Rarely; Drug Use: Current  History - marijuana; Caffeine Use: Rarely; Financial Concerns: No; Food, Clothing or Shelter Needs: No; Support System Lacking: No; Transportation Concerns: No Electronic Signature(s) Signed: 07/26/2020 6:19:37 PM By: Lenda Kelp PA-C Signed: 07/28/2020 7:52:35 AM By: Hansel Feinstein Entered By: Hansel Feinstein on 07/26/2020 13:37:35 Bruntz, [name] ([id number]) -------------------------------------------------------------------------------- SuperBill Details Patient Name: GRACIE, GUPTA. Date of Service: 07/26/2020 Medical Record Number: [id number] Patient Account Number: [id number] Date of Birth/Sex: 1985/11/27 (35 y.o. 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 E66.01 Morbid (severe) obesity due to excess calories Facility Procedures CPT4 Code: 56433295 Description: 99213 - WOUND CARE VISIT-LEV 3 EST PT Modifier: Quantity: 1 CPT4 Code: 18841660 Description: (Facility Use Only) 29581LT - APPLY MULTLAY COMPRS LWR LT LEG Modifier: Quantity: 1 Physician Procedures CPT4 Code: 6301601 Description: 99204 - WC PHYS LEVEL 4 - NEW PT Modifier: Quantity: 1 CPT4 Code: Description: ICD-10 Diagnosis Description I87.2 Venous insufficiency (chronic) (peripheral) I89.0 Lymphedema, not elsewhere classified 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

## 2020-07-28 NOTE — Progress Notes (Signed)
[name], [name] ([id number]) Visit Report for 07/26/2020 Allergy List Details Patient Name: [name], [name]. Date of Service: 07/26/2020 1:00 PM Medical Record Number: [id number] Patient Account Number: [id number] Date of Birth/Sex: [birth year] (35 y.o. F) Treating RN: Hansel Feinstein Primary Care Evangelyne Loja: Lenor Coffin Other Clinician: Lolita Cram Referring Danya Spearman: Referral, Self Treating Taiven Greenley/Extender: Rowan Blase in Treatment: 0 Allergies Active Allergies tramadol Reaction: "felt weird"/doesn't tolerated Severity: Mild Allergy Notes Electronic Signature(s) Signed: 07/28/2020 7:52:35 AM By: Hansel Feinstein Entered By: Hansel Feinstein on 07/26/2020 13:33:25 Pucillo, [name] -------------------------------------------------------------------------------- Arrival Information Details Patient Name: [name], [name] Date of Service: 07/26/2020 1:00 PM Medical Record Number: [id number] Patient Account Number: [id number] Date of Birth/Sex: 12/23/1985 (35 y.o. F) Treating RN: Hansel Feinstein Primary Care Azhia Siefken: Lenor Coffin Other Clinician: Lolita Cram Referring Obdulio Mash: Referral, Self Treating Julena Barbour/Extender: Rowan Blase in Treatment: 0 Visit Information Patient Arrived: Ambulatory Arrival Time: 13:14 Accompanied By: self Transfer Assistance: None Patient Identification Verified: Yes Secondary Verification Process Completed: Yes History Since Last Visit Added or deleted any medications: Yes Any new allergies or adverse reactions: No Has Dressing in Place as Prescribed: No Pain Present Now: No Electronic Signature(s) Signed: 07/28/2020 7:52:35 AM By: Hansel Feinstein Entered By: Hansel Feinstein on 07/26/2020 13:21:45 Trang, [name] ([id number]) -------------------------------------------------------------------------------- Clinic Level of Care Assessment Details Patient Name: [name], [name] Date of Service: 07/26/2020 1:00 PM Medical Record Number:  [id number] Patient Account Number: [id number] Date of Birth/Sex: Nov 18, 1985 (35 y.o. F) Treating RN: Rogers Blocker Primary Care Ayliana Casciano: Lenor Coffin Other Clinician: Lolita Cram Referring Cherysh Epperly: Referral, Self Treating Precious Segall/Extender: Rowan Blase in Treatment: 0 Clinic Level of Care Assessment Items TOOL 3 Quantity Score X - Use when EandM and Procedure is performed on FOLLOW-UP visit 1 0 ASSESSMENTS - Nursing Assessment / Reassessment X - Reassessment of Co-morbidities (includes updates in patient status) 1 10 X- 1 5 Reassessment of Adherence to Treatment Plan ASSESSMENTS - Wound and Skin Assessment / Reassessment []  - Points for Wound Assessment can only be taken for a new wound of unknown or different etiology and a 0 procedure is NOT performed to that wound X- 1 5 Simple Wound Assessment / Reassessment - one wound []  - 0 Complex Wound Assessment / Reassessment - multiple wounds []  - 0 Dermatologic / Skin Assessment (not related to wound area) ASSESSMENTS - Focused Assessment []  - Circumferential Edema Measurements - multi extremities 0 []  - 0 Nutritional Assessment / Counseling / Intervention []  - 0 Lower Extremity Assessment (monofilament, tuning fork, pulses) []  - 0 Peripheral Arterial Disease Assessment (using hand held doppler) ASSESSMENTS - Ostomy and/or Continence Assessment and Care []  - Incontinence Assessment and Management 0 []  - 0 Ostomy Care Assessment and Management (repouching, etc.) PROCESS - Coordination of Care []  - Points for Discharge Coordination can only be taken for a new wound of unknown or different etiology and a 0 procedure is NOT performed to that wound X- 1 15 Simple Patient / Family Education for ongoing care []  - 0 Complex (extensive) Patient / Family Education for ongoing care []  - 0 Staff obtains , Records, Test Results / Process Orders []  - 0 Staff telephones HHA, Nursing Homes / Clarify orders / etc []  -  0 Routine Transfer to another Facility (non-emergent condition) []  - 0 Routine Hospital Admission (non-emergent condition) []  - 0 New Admissions / / Ordering NPWT, Apligraf, etc. []  - 0 Emergency Hospital Admission (emergent condition) X- 1 10 Simple Discharge Coordination []  - 0  Complex (extensive) Discharge Coordination PROCESS - Special Needs []  - Pediatric / Minor Patient Management 0 []  - 0 Isolation Patient Management []  - 0 Hearing / Language / Visual special needs []  - 0 Assessment of Community assistance (transportation, D/C planning, etc.) Conley SimmondsSUMMERS, Shineka J. ([id number]) []  - 0 Additional assistance / Altered mentation []  - 0 Support Surface(s) Assessment (bed, cushion, seat, etc.) INTERVENTIONS - Wound Cleansing / Measurement []  - Points for Wound Cleaning / Measurement, Wound Dressing, Specimen Collection and Specimen taken to lab 0 can only be taken for a new wound of unknown or different etiology and a procedure is NOT performed to that wound X- 1 5 Simple Wound Cleansing - one wound []  - 0 Complex Wound Cleansing - multiple wounds X- 1 5 Wound Imaging (photographs - any number of wounds) []  - 0 Wound Tracing (instead of photographs) X- 1 5 Simple Wound Measurement - one wound []  - 0 Complex Wound Measurement - multiple wounds INTERVENTIONS - Wound Dressings []  - Small Wound Dressing one or multiple wounds 0 X- 1 15 Medium Wound Dressing one or multiple wounds []  - 0 Large Wound Dressing one or multiple wounds INTERVENTIONS - Miscellaneous []  - External ear exam 0 []  - 0 Specimen Collection (cultures, biopsies, blood, body fluids, etc.) []  - 0 Specimen(s) / Culture(s) sent or taken to Lab for analysis []  - 0 Patient Transfer (multiple staff / Nurse, adultHoyer Lift / Similar devices) []  - 0 Simple Staple / Suture removal (25 or less) []  - 0 Complex Staple / Suture removal (26 or more) []  - 0 Hypo / Hyperglycemic Management (close  monitor of Blood Glucose) X- 1 15 Ankle / Brachial Index (ABI) - do not check if billed separately X- 1 5 Vital Signs Has the patient been seen at the hospital within the last three years: Yes Total Score: 95 Level Of Care: New/Established - Level 3 Electronic Signature(s) Signed: 07/26/2020 4:59:45 PM By: Phillis HaggisSanchez Pereyda, Dondra PraderKenia RN Entered By: Phillis HaggisSanchez Pereyda, Kenia on 07/26/2020 14:18:29 Abrigo, [name] ([id number]) -------------------------------------------------------------------------------- Compression Therapy Details Patient Name: Conley SimmondsSUMMERS, Orena J. Date of Service: 07/26/2020 1:00 PM Medical Record Number: [id number] Patient Account Number: [id number] Date of Birth/Sex: 10/26/1985 (35 y.o. F) Treating RN: Rogers BlockerSanchez, Kenia Primary Care Mahalie Kanner: Lenor Coffinlson, Daniel Other Clinician: Lolita CramBurnette, Kyara Referring Mayank Teuscher: Referral, Self Treating Sarissa Dern/Extender: Rowan BlaseStone, Hoyt Weeks in Treatment: 0 Compression Therapy Performed for Wound Assessment: Wound #3 Left,Distal,Medial Lower Leg Performed By: Ovidio Hangerlinician Sanchez, Kenia, RN Compression Type: Four Layer Pre Treatment ABI: 1.3 Post Procedure Diagnosis Same as Pre-procedure Electronic Signature(s) Signed: 07/26/2020 4:59:45 PM By: Phillis HaggisSanchez Pereyda, Dondra PraderKenia RN Entered By: Phillis HaggisSanchez Pereyda, Dondra PraderKenia on 07/26/2020 14:17:56 Willenbring, [name] ([id number]) -------------------------------------------------------------------------------- Encounter Discharge Information Details Patient Name: Conley SimmondsSUMMERS, Kseniya J. Date of Service: 07/26/2020 1:00 PM Medical Record Number: [id number] Patient Account Number: [id number] Date of Birth/Sex: 04/28/1985 (35 y.o. F) Treating RN: Yevonne PaxEpps, [name] Primary Care Dhani Dannemiller: Lenor Coffinlson, Daniel Other Clinician: Lolita CramBurnette, Kyara Referring Joann Jorge: Referral, Self Treating Radek Carnero/Extender: Rowan BlaseStone, Hoyt Weeks in Treatment: 0 Encounter Discharge Information Items Discharge Condition: Stable Ambulatory Status: Ambulatory Discharge  Destination: Home Transportation: Private Auto Accompanied By: self Schedule Follow-up Appointment: Yes Clinical Summary of Care: Patient Declined Electronic Signature(s) Signed: 07/28/2020 10:06:31 AM By: Yevonne PaxEpps, [name] RN Entered By: Yevonne PaxEpps, [name] on 07/26/2020 14:43:35 Moose, [name] ([id number]) -------------------------------------------------------------------------------- Lower Extremity Assessment Details Patient Name: Conley SimmondsSUMMERS, Rolena J. Date of Service: 07/26/2020 1:00 PM Medical Record Number: [id number] Patient Account Number: [id number] Date of Birth/Sex: 08/16/1985 (35 y.o. F) Treating RN: Hansel FeinsteinBishop, Joy Primary  Care Sabatino Williard: Lenor Coffin Other Clinician: Lolita Cram Referring Janille Draughon: Referral, Self Treating Jyla Hopf/Extender: Rowan Blase in Treatment: 0 Edema Assessment Assessed: [Left: Yes] [Right: No] [Left: Edema] [Right: :] Calf Left: Right: Point of Measurement: 31 cm From Medial Instep 56.5 cm Ankle Left: Right: Point of Measurement: 9 cm From Medial Instep 33 cm Knee To Floor Left: Right: From Medial Instep 44 cm Vascular Assessment Pulses: Dorsalis Pedis Doppler Audible: [Left:Yes] Posterior Tibial Doppler Audible: [Left:Yes] Popliteal Doppler Audible: [Left:Yes] Blood Pressure: Brachial: [Left:100] Dorsalis Pedis: 120 Ankle: Posterior Tibial: 130 Ankle Brachial Index: [Left:1.30] Electronic Signature(s) Signed: 07/28/2020 7:52:35 AM By: Hansel Feinstein Entered By: Hansel Feinstein on 07/26/2020 13:48:54 Paschen, [name] ([id number]) -------------------------------------------------------------------------------- Multi Wound Chart Details Patient Name: ALEZA, PEW. Date of Service: 07/26/2020 1:00 PM Medical Record Number: [id number] Patient Account Number: [id number] Date of Birth/Sex: 27-Jan-1986 (35 y.o. F) Treating RN: Rogers Blocker Primary Care Peng Thorstenson: Lenor Coffin Other Clinician: Lolita Cram Referring Blaike Newburn: Referral,  Self Treating Yolando Gillum/Extender: Rowan Blase in Treatment: 0 Vital Signs Height(in): 66 Pulse(bpm): 88 Weight(lbs): 388 Blood Pressure(mmHg): 139/82 Body Mass Index(BMI): 63 Temperature(F): 98.3 Respiratory Rate(breaths/min): 16 Photos: [N/A:N/A] Wound Location: Left, Distal, Medial Lower Leg N/A N/A Wounding Event: Gradually Appeared N/A N/A Primary Etiology: Lymphedema N/A N/A Date Acquired: 07/18/2020 N/A N/A Weeks of Treatment: 0 N/A N/A Wound Status: Open N/A N/A Measurements L x W x D (cm) 0.2x0.2x0.1 N/A N/A Area (cm) : 0.031 N/A N/A Volume (cm) : 0.003 N/A N/A % Reduction in Area: 0.00% N/A N/A % Reduction in Volume: 0.00% N/A N/A Classification: Unclassifiable N/A N/A Exudate Amount: Small N/A N/A Exudate Type: Serosanguineous N/A N/A Exudate Color: red, brown N/A N/A Treatment Notes Electronic Signature(s) Signed: 07/26/2020 4:59:45 PM By: Phillis Haggis, Dondra Prader RN Entered By: Phillis Haggis, Dondra Prader on 07/26/2020 14:11:35 Yo, [name] ([id number]) -------------------------------------------------------------------------------- Multi-Disciplinary Care Plan Details Patient Name: ANIE, JUNIEL. Date of Service: 07/26/2020 1:00 PM Medical Record Number: [id number] Patient Account Number: [id number] Date of Birth/Sex: 02/26/1986 (35 y.o. F) Treating RN: Rogers Blocker Primary Care Sausha Raymond: Lenor Coffin Other Clinician: Lolita Cram Referring Kaneisha Ellenberger: Referral, Self Treating Demorris Choyce/Extender: Rowan Blase in Treatment: 0 Active Inactive Orientation to the Wound Care Program Nursing Diagnoses: Knowledge deficit related to the wound healing center program Goals: Patient/caregiver will verbalize understanding of the Wound Healing Center Program Date Initiated: 07/26/2020 Target Resolution Date: 07/26/2020 Goal Status: Active Interventions: Provide education on orientation to the wound center Notes: Wound/Skin Impairment Nursing  Diagnoses: Impaired tissue integrity Goals: Patient/caregiver will verbalize understanding of skin care regimen Date Initiated: 07/26/2020 Target Resolution Date: 07/26/2020 Goal Status: Active Ulcer/skin breakdown will have a volume reduction of 30% by week 4 Date Initiated: 07/26/2020 Target Resolution Date: 08/25/2020 Goal Status: Active Ulcer/skin breakdown will have a volume reduction of 50% by week 8 Date Initiated: 07/26/2020 Target Resolution Date: 09/25/2020 Goal Status: Active Ulcer/skin breakdown will have a volume reduction of 80% by week 12 Date Initiated: 07/26/2020 Target Resolution Date: 10/25/2020 Goal Status: Active Ulcer/skin breakdown will heal within 14 weeks Date Initiated: 07/26/2020 Target Resolution Date: 11/25/2020 Goal Status: Active Interventions: Assess patient/caregiver ability to obtain necessary supplies Assess patient/caregiver ability to perform ulcer/skin care regimen upon admission and as needed Assess ulceration(s) every visit Provide education on ulcer and skin care Treatment Activities: Referred to DME Gokul Waybright for dressing supplies : 07/26/2020 Skin care regimen initiated : 07/26/2020 Notes: Electronic Signature(s) Signed: 07/26/2020 4:59:45 PM By: Phillis Haggis, Dondra Prader RN Entered By: Phillis Haggis, Dondra Prader on 07/26/2020 14:10:49  BEATRICE, ZIEHM ([id number] Schilling, [name] ([id number]) -------------------------------------------------------------------------------- Pain Assessment Details Patient Name: OLUWATAMILORE, STARNES. Date of Service: 07/26/2020 1:00 PM Medical Record Number: [id number] Patient Account Number: [id number] Date of Birth/Sex: 1986-03-01 (35 y.o. F) Treating RN: Hansel Feinstein Primary Care Elizeo Rodriques: Lenor Coffin Other Clinician: Lolita Cram Referring Keyaria Lawson: Referral, Self Treating Cerys Winget/Extender: Rowan Blase in Treatment: 0 Active Problems Location of Pain Severity and Description of Pain Patient Has Paino  No Site Locations Rate the pain. Current Pain Level: 0 Pain Management and Medication Current Pain Management: Notes no pain at this time Electronic Signature(s) Signed: 07/28/2020 7:52:35 AM By: Hansel Feinstein Entered By: Hansel Feinstein on 07/26/2020 13:21:57 Sarin, [name] ([id number]) -------------------------------------------------------------------------------- Patient/Caregiver Education Details Patient Name: FERN, CANOVA Date of Service: 07/26/2020 1:00 PM Medical Record Number: [id number] Patient Account Number: [id number] Date of Birth/Gender: Jul 12, 1985 (35 y.o. F) Treating RN: Rogers Blocker Primary Care Physician: Lenor Coffin Other Clinician: Lolita Cram Referring Physician: Referral, Self Treating Physician/Extender: Rowan Blase in Treatment: 0 Education Assessment Education Provided To: Patient Education Topics Provided Wound/Skin Impairment: Methods: Explain/Verbal Responses: State content correctly Electronic Signature(s) Signed: 07/26/2020 4:59:45 PM By: Phillis Haggis, Dondra Prader RN Entered By: Phillis Haggis, Dondra Prader on 07/26/2020 14:18:46 Servais, [name] ([id number]) -------------------------------------------------------------------------------- Wound Assessment Details Patient Name: DAWNISHA, MARQUINA. Date of Service: 07/26/2020 1:00 PM Medical Record Number: [id number] Patient Account Number: [id number] Date of Birth/Sex: 31-Dec-1985 (35 y.o. F) Treating RN: Hansel Feinstein Primary Care Jadiel Schmieder: Lenor Coffin Other Clinician: Lolita Cram Referring Torrie Namba: Referral, Self Treating Wanna Gully/Extender: Rowan Blase in Treatment: 0 Wound Status Wound Number: 3 Primary Etiology: Venous Leg Ulcer Wound Location: Left, Distal, Medial Lower Leg Secondary Etiology: Lymphedema Wounding Event: Gradually Appeared Wound Status: Open Date Acquired: 07/18/2020 Comorbid History: Hypertension, Peripheral Venous Disease Weeks Of Treatment: 0 Clustered  Wound: No Photos Wound Measurements Length: (cm) 0.2 Width: (cm) 0.2 Depth: (cm) 0.1 Area: (cm) 0.031 Volume: (cm) 0.003 % Reduction in Area: 0% % Reduction in Volume: 0% Epithelialization: None Tunneling: No Undermining: No Wound Description Classification: Unclassifiable Exudate Amount: Medium Exudate Type: Sanguinous Exudate Color: red Foul Odor After Cleansing: No Slough/Fibrino No Wound Bed Granulation Amount: None Present (0%) Exposed Structure Necrotic Amount: None Present (0%) Fascia Exposed: No Fat Layer (Subcutaneous Tissue) Exposed: No Tendon Exposed: No Muscle Exposed: No Joint Exposed: No Bone Exposed: No Treatment Notes Wound #3 (Lower Leg) Wound Laterality: Left, Medial, Distal Cleanser Soap and Water Discharge Instruction: Gently cleanse wound with antibacterial soap, rinse and pat dry prior to dressing wounds Peri-Wound Care ESSENSE, BOUSQUET ([id number]) Topical Primary Dressing Xtrasorb Classic Super Absorbent Dressing, 4x5 (in/in) Secondary Dressing Secured With Compression Wrap Profore LF 4 Multi-Layer Compression Bandaging System Discharge Instruction: Apply 4 multi-layer wrap as directed. Compression Stockings Add-Ons Electronic Signature(s) Signed: 07/26/2020 4:59:45 PM By: Phillis Haggis, Dondra Prader RN Signed: 07/28/2020 7:52:35 AM By: Hansel Feinstein Entered By: Phillis Haggis, Dondra Prader on 07/26/2020 14:12:26 Nuon, [name] ([id number]) -------------------------------------------------------------------------------- Vitals Details Patient Name: TSERING, LEAMAN. Date of Service: 07/26/2020 1:00 PM Medical Record Number: [id number] Patient Account Number: [id number] Date of Birth/Sex: 1985/11/18 (35 y.o. F) Treating RN: Hansel Feinstein Primary Care Misa Fedorko: Lenor Coffin Other Clinician: Lolita Cram Referring Reesha Debes: Referral, Self Treating Tonica Brasington/Extender: Rowan Blase in Treatment: 0 Vital Signs Time Taken: 13:22 Temperature  (F): 98.3 Height (in): 66 Pulse (bpm): 88 Source: Stated Respiratory Rate (breaths/min): 16 Weight (lbs): 388 Blood Pressure (mmHg): 139/82 Source: Stated Reference Range: 80 - 120 mg / dl Body Mass Index (BMI):  62.6 Electronic Signature(s) Signed: 07/28/2020 7:52:35 AM By: Hansel Feinstein Entered ByHansel Feinstein on 07/26/2020 13:29:05

## 2020-08-01 ENCOUNTER — Other Ambulatory Visit: Payer: Self-pay

## 2020-08-01 ENCOUNTER — Ambulatory Visit (INDEPENDENT_AMBULATORY_CARE_PROVIDER_SITE_OTHER): Payer: BC Managed Care – PPO | Admitting: Family Medicine

## 2020-08-01 ENCOUNTER — Encounter: Payer: Self-pay | Admitting: Family Medicine

## 2020-08-01 DIAGNOSIS — J302 Other seasonal allergic rhinitis: Secondary | ICD-10-CM | POA: Diagnosis not present

## 2020-08-01 DIAGNOSIS — I83893 Varicose veins of bilateral lower extremities with other complications: Secondary | ICD-10-CM

## 2020-08-01 DIAGNOSIS — R1013 Epigastric pain: Secondary | ICD-10-CM

## 2020-08-01 MED ORDER — CETIRIZINE HCL 10 MG PO TABS: 10.0000 mg | ORAL_TABLET | Freq: Every day | ORAL | 11 refills | Status: DC

## 2020-08-01 MED ORDER — FLUTICASONE PROPIONATE 50 MCG/ACT NA SUSP: 2.0000 | Freq: Every day | NASAL | 6 refills | Status: DC

## 2020-08-01 NOTE — Progress Notes (Signed)
    SUBJECTIVE:   CHIEF COMPLAINT / HPI:   abd pain: this has resolved.  She is now taking her protonix as needed. Still uncertain if it was associated with certain foods.  Still having her same issues with diarrhea.   Nasal congestion: pt states her 'nose is stopped up' and she is producing thick yellow mucous.  This has been ongoing for a few days.  Also complains of watery, itchy eyes.  Not taking any medication for it.  No cough, no fever.  No SOB. States this occurs every year around this time.   Pt states she has an appt tomorrow with the vein specialist regarding some weeping and swelling she has on her right leg.  She has had issues with her lower extremity veins in the past.  They wrapped her legs last week but she took the wrap off due to discomfort.   PERTINENT  PMH / PSH: GERD, obesity,   OBJECTIVE:   BP (!) 107/56   Pulse 92   Ht 5\' 6"  (1.676 m)   Wt (!) 389 lb (176.4 kg)   SpO2 97%   BMI 62.79 kg/m   Gen: alert, oriented.  No acute distress.  Obese.   CV: RRR.  Pulm: LCTAB Ext: lateral right calf has small eschar.  No bleeding. No erythema or warmth.    ASSESSMENT/PLAN:   Epigastric abdominal pain Resolved.  Taking protonix as needed.   Seasonal allergies Symptoms consistent with seasonal allergies. Sent in rx for zyrtec and flonase. Advised pt on how to use these medications.    Varicose veins of bilateral lower extremities with other complications Pt going to see the vein specialists tomorrow.  Saw them last week as well and was given a compression wrap but she did not tolerate this.  No signs of cellulitis on the lower extremities today.      , MD [facility]

## 2020-08-01 NOTE — Patient Instructions (Signed)
It was nice to see you again today,  I am glad your belly pain has resolved.  For your stuffiness I will prescribe you some medication for allergies.  Take the Zyrtec once a day as needed.  This will provide immediate relief.  The other medicine, Flonase, is to be taken every day regardless of whether any more symptomatic or not.  That medicine will take longer to start to take effect so you should continue using it for at least 4 weeks.  When you are using the Flonase spray remember to point nozzle to the outside part of your nostrils.  If your spraining her left nostril you should point towards the left ear and same thing with the right side.  have a great day,  Frederic Jericho, MD

## 2020-08-02 ENCOUNTER — Encounter: Payer: BC Managed Care – PPO | Admitting: Physician Assistant

## 2020-08-02 DIAGNOSIS — J302 Other seasonal allergic rhinitis: Secondary | ICD-10-CM | POA: Insufficient documentation

## 2020-08-02 DIAGNOSIS — L98492 Non-pressure chronic ulcer of skin of other sites with fat layer exposed: Secondary | ICD-10-CM | POA: Diagnosis not present

## 2020-08-02 NOTE — Assessment & Plan Note (Signed)
Symptoms consistent with seasonal allergies. Sent in rx for zyrtec and flonase. Advised pt on how to use these medications.

## 2020-08-02 NOTE — Assessment & Plan Note (Signed)
Resolved.  Taking protonix as needed.

## 2020-08-02 NOTE — Assessment & Plan Note (Signed)
Pt going to see the vein specialists tomorrow.  Saw them last week as well and was given a compression wrap but she did not tolerate this.  No signs of cellulitis on the lower extremities today.

## 2020-08-02 NOTE — Progress Notes (Addendum)
BAHAR, SHELDEN ([id number]) Visit Report for 08/02/2020 Chief Complaint Document Details Patient Name: [name] [name] [name] [name]. Date of Service: 08/02/2020 12:45 PM Medical Record Number: [id number] Patient Account Number: [id number] Date of Birth/Sex: February 20, 1986 (35 y.o. F) Treating RN: Rogers Blocker Primary Care Provider: Lenor Coffin Other Clinician: Lolita Cram Referring Provider: Lenor Coffin Treating Provider/Extender: Rowan Blase in Treatment: 1 Information Obtained from: Patient Chief Complaint Left LE Ulcer Electronic Signature(s) Signed: 08/02/2020 1:16:16 PM By: Lenda Kelp PA-C Entered By: Lenda Kelp on 08/02/2020 13:16:15 [name] [name] Patter ([id number]) -------------------------------------------------------------------------------- HPI Details Patient Name: [name] [name] Date of Service: 08/02/2020 12:45 PM Medical Record Number: [id number] Patient Account Number: [id number] Date of Birth/Sex: February 19, 1986 (35 y.o. F) Treating RN: Rogers Blocker Primary Care Provider: Lenor Coffin Other Clinician: Lolita Cram Referring Provider: Lenor Coffin Treating Provider/Extender: Rowan Blase in Treatment: 1 History of Present Illness HPI Description: 35 year old patient who started with having ulcerations on the right lower leg on the lateral part of her ankle for about 2 weeks. She was seen in the ER at [facility] and advised to see the wound care for a consultation. No X-rays of workup was done during the ER visit and no prescription for any medications of compression wraps were given. the patient is not diabetic but does have hypertension and her medications have been reviewed by me. 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 The right and left greater saphenous vein was found to be  tortuous. Deep venous system was also not competent and there was reflux of more than 500 ms. She was then seen by Dr. Tawanna Cooler Early who recommended that the patient would not benefit from endovenous ablation and he had recommended vein stripping on the right side and multiple small phlebectomy procedures on the left side. the patient did not follow-up due to social economic reasons. She has not been wearing any compression stockings and has not taken any specific treatment for varicose veins for the last 3 years. 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 We have obtained some appointments to see the vascular surgeons by the end of the month and the patient would like to follow up with me at my [facility] on Wednesday, June 29. 10/14/2014 -- she could not see me yesterday in [place] and hence has come for a review today. She has a vascular workup to be done this afternoon at [facility]. She is doing fine otherwise. 10/22/2014 -- she was seen by Dr. [name] and he has recommended surgical removal of her right saphenous vein from distal thigh to saphenofemoral junction and stab phlebectomy's of multiple large tributary branches throughout her thigh and calf. This would be done under general anesthesia in the outpatient setting. 10/29/2014 -- she is trying to work on a surgical date and in the meanwhile we have got insurance clearance for Apligraf and we will start this next week. 7/22 2016 -- she is here for the first application of Apligraf. 11/19/2014 -- she is here for a second application of Apligraf 11/26/2014 -- she has done fine after her last application of Apligraf and is awaiting her surgery which is scheduled for August 31. 12/03/2014 -- she is doing fine and is here  for her third application of Apligraf. 12/21/2014 -- She had surgery on 12/15/2014 by Dr.Early who did #1  ligation and stripping of right great saphenous vein from distal thigh to saphenofemoral junction, #2 stab phlebectomy of large tributary varicose veins in the thigh popliteal space and calf. She had an Ace wrap up to her groin and this was removed today and the Unna's boot was also removed. 12/28/2014 -- she is here for her fourth application of Apligraf. 01/06/2015 - he saw her vascular surgeon Dr. Arbie Cookey who was pleased with her progress and he has confirmed that no surgical procedures could be attempted on the left side. 01/13/2015 -- her wound looks very good and she's been having no problems whatsoever. Readmission: 07/26/2020 upon evaluation today patient presents for initial inspection here in our clinic for a new issue with her left leg although she is previously been seen due to issues with the right leg back in 2016. At that time she was seeing Dr. Arbie Cookey who is a vein/vascular specialist in [place]. He has since semiretired from what I understand. He is working out of [employer] I believe. Nonetheless she tells me at the time that there was really nothing to do for her left leg although the right leg was where they did most of the work. Subsequently she states that she is done fairly well until just in the past week where she had issues with bleeding from what appears to be varicose vein on the left leg medially. Unfortunately this has continued to be an issue although she tells me at first it was coming much more significantly Down quite a bit but nonetheless has not completely resolved. Every time she showers she notices that it starts to drain a little bit more. She does have a history of chronic venous insufficiency, lymphedema, varicose veins bilaterally, and obesity. 08/02/2020 upon evaluation today patient appears to be doing about the same in regards to the ulcer on her left leg. She has some eschar covering there is definitely some fluid collecting underneath unfortunately. With  that being said she tells me she is still having a tremendous amount of pain therefore she is really not able to allow me to clean this off very effectively to be perfectly honest. I think we need to try to soften this up Electronic Signature(s) Signed: 08/02/2020 1:34:11 PM By: Lenda Kelp PA-C Entered By: Lenda Kelp on 08/02/2020 13:34:11 Keast, [name] Patter ([id number]) -------------------------------------------------------------------------------- Physical Exam Details Patient Name: ALTAMESE, DEGUIRE Date of Service: 08/02/2020 12:45 PM Medical Record Number: [id number] Patient Account Number: [id number] Date of Birth/Sex: January 12, 1986 (35 y.o. F) Treating RN: Rogers Blocker Primary Care Provider: Lenor Coffin Other Clinician: Lolita Cram Referring Provider: Lenor Coffin Treating Provider/Extender: Allen Derry Weeks in Treatment: 1 Constitutional Obese and well-hydrated in no acute distress. Respiratory normal breathing without difficulty. Psychiatric this patient is able to make decisions and demonstrates good insight into disease process. Alert and Oriented x 3. pleasant and cooperative. Notes Upon inspection patient's wound bed actually showed signs of poor granulation epithelization. She actually has eschar noted unfortunately at this point which I think is going to need to be addressed. With that being said she does not want me to perform any debridement due to the pain that she is experiencing. I completely understand. With that being said I think that we need to continue to carefully address this currently. This means that we will get a try some medication I think Santyl could be a good option.  We will also go ahead and see about using the [facility] over top. Electronic Signature(s) Signed: 08/02/2020 1:47:35 PM By: Lenda Kelp PA-C Entered By: Lenda Kelp on 08/02/2020 13:47:34 [name] [name] Patter  ([id number]) -------------------------------------------------------------------------------- Physician Orders Details Patient Name: [name], [name] Date of Service: 08/02/2020 12:45 PM Medical Record Number: [id number] Patient Account Number: [id number] Date of Birth/Sex: 05-Apr-1986 (35 y.o. F) Treating RN: Rogers Blocker Primary Care Provider: Lenor Coffin Other Clinician: Lolita Cram Referring Provider: Lenor Coffin Treating Provider/Extender: Rowan Blase in Treatment: 1 Verbal / Phone Orders: No Diagnosis Coding ICD-10 Coding Code Description I87.2 Venous insufficiency (chronic) (peripheral) I89.0 Lymphedema, not elsewhere classified I83.893 Varicose veins of bilateral lower extremities with other complications L98.492 Non-pressure chronic ulcer of skin of other sites with fat layer exposed E66.01 Morbid (severe) obesity due to excess calories Follow-up Appointments o Return Appointment in 2 weeks. o Nurse Visit as needed - Next week Bathing/ Shower/ Hygiene o Wash wounds with antibacterial soap and water. Edema Control - Lymphedema / Segmental Compressive Device / Other Left Lower Extremity o Optional: One layer of unna paste to top of compression wrap (to act as an anchor). o 3 Layer Compression System for Lymphedema. Wound Treatment Wound #3 - Lower Leg Wound Laterality: Left, Medial, Distal Cleanser: Soap and Water 1 x Per Week/15 Days Discharge Instructions: Gently cleanse wound with antibacterial soap, rinse and pat dry prior to dressing wounds Topical: Santyl Collagenase Ointment, 30 (gm), tube 1 x Per Week/15 Days Discharge Instructions: apply nickel thick to wound bed only Primary Dressing: Hydrofera Blue Ready Transfer Foam, 2.5x2.5 (in/in) 1 x Per Week/15 Days Discharge Instructions: Apply Hydrofera Blue Ready to wound bed as directed Primary Dressing: Xtrasorb Classic Super Absorbent Dressing, 4x5 (in/in) 1 x Per Week/15 Days Compression  Wrap: Profore Lite LF 3 Multilayer Compression Bandaging System 1 x Per Week/15 Days Discharge Instructions: Apply 3 multi-layer wrap as prescribed. Electronic Signature(s) Signed: 08/02/2020 5:05:23 PM By: Phillis Haggis, Dondra Prader RN Signed: 08/03/2020 12:58:56 PM By: Lenda Kelp PA-C Entered By: Phillis Haggis, Dondra Prader on 08/02/2020 13:31:44 Dimock, [name] Patter ([id number]) -------------------------------------------------------------------------------- Problem List Details Patient Name: URVI, IMES. Date of Service: 08/02/2020 12:45 PM Medical Record Number: [id number] Patient Account Number: [id number] Date of Birth/Sex: October 29, 1985 (35 y.o. F) Treating RN: Rogers Blocker Primary Care Provider: Lenor Coffin Other Clinician: Lolita Cram Referring Provider: Lenor Coffin Treating Provider/Extender: Rowan Blase in Treatment: 1 Active Problems ICD-10 Encounter Code Description Active Date MDM Diagnosis I87.2 Venous insufficiency (chronic) (peripheral) 07/26/2020 No Yes I89.0 Lymphedema, not elsewhere classified 07/26/2020 No Yes I83.893 Varicose veins of bilateral lower extremities with other complications 07/26/2020 No Yes L98.492 Non-pressure chronic ulcer of skin of other sites with fat layer exposed 07/26/2020 No Yes E66.01 Morbid (severe) obesity due to excess calories 07/26/2020 No Yes Inactive Problems Resolved Problems Electronic Signature(s) Signed: 08/02/2020 1:16:10 PM By: Lenda Kelp PA-C Entered By: Lenda Kelp on 08/02/2020 13:16:09 [name] [name] Patter ([id number]) -------------------------------------------------------------------------------- Progress Note Details Patient Name: [name] Simmonds Date of Service: 08/02/2020 12:45 PM Medical Record Number: [id number] Patient Account Number: [id number] Date of Birth/Sex: 1985/05/09 (35 y.o. F) Treating RN: Rogers Blocker Primary Care Provider: Lenor Coffin Other Clinician: Lolita Cram Referring  Provider: Lenor Coffin Treating Provider/Extender: Rowan Blase in Treatment: 1 Subjective Chief Complaint Information obtained from Patient Left LE Ulcer History of Present Illness (HPI) 35 year old patient who started with having ulcerations on the right lower leg on the lateral part of her ankle  for about 2 weeks. She was seen in the ER at [facility] and advised to see the wound care for a consultation. No X-rays of workup was done during the ER visit and no prescription for any medications of compression wraps were given. the patient is not diabetic but does have hypertension and her medications have been reviewed by me. 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 The right and left greater saphenous vein was found to be tortuous. Deep venous system was also not competent and there was reflux of more than 500 ms. She was then seen by Dr. Tawanna Cooler Early who recommended that the patient would not benefit from endovenous ablation and he had recommended vein stripping on the right side and multiple small phlebectomy procedures on the left side. the patient did not follow-up due to social economic reasons. She has not been wearing any compression stockings and has not taken any specific treatment for varicose veins for the last 3 years. 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 We have obtained some appointments to see the vascular surgeons by the end of the month and the patient would like to follow up with me at my [facility] on Wednesday, June 29. 10/14/2014 -- she could not see me yesterday in [place] and hence has come for a review today. She has a vascular workup to be done this afternoon at [facility]. She is  doing fine otherwise. 10/22/2014 -- she was seen by Dr. [name] and he has recommended surgical removal of her right saphenous vein from distal thigh to saphenofemoral junction and stab phlebectomy's of multiple large tributary branches throughout her thigh and calf. This would be done under general anesthesia in the outpatient setting. 10/29/2014 -- she is trying to work on a surgical date and in the meanwhile we have got insurance clearance for Apligraf and we will start this next week. 7/22 2016 -- she is here for the first application of Apligraf. 11/19/2014 -- she is here for a second application of Apligraf 11/26/2014 -- she has done fine after her last application of Apligraf and is awaiting her surgery which is scheduled for August 31. 12/03/2014 -- she is doing fine and is here for her third application of Apligraf. 12/21/2014 -- She had surgery on 12/15/2014 by Dr.Early who did #1 ligation and stripping of right great saphenous vein from distal thigh to saphenofemoral junction, #2 stab phlebectomy of large tributary varicose veins in the thigh popliteal space and calf. She had an Ace wrap up to her groin and this was removed today and the Unna's boot was also removed. 12/28/2014 -- she is here for her fourth application of Apligraf. 01/06/2015 - he saw her vascular surgeon Dr. Arbie Cookey who was pleased with her progress and he has confirmed that no surgical procedures could be attempted on the left side. 01/13/2015 -- her wound looks very good and she's been having no problems whatsoever. Readmission: 07/26/2020 upon evaluation today patient presents for initial inspection here in our clinic for a new issue with her left leg although she is previously been seen due to issues with the right leg back in 2016. At that time she was seeing Dr. Arbie Cookey who is a vein/vascular specialist  in [place]. He has since semiretired from what I understand. He is working out of [employer] I believe.  Nonetheless she tells me at the time that there was really nothing to do for her left leg although the right leg was where they did most of the work. Subsequently she states that she is done fairly well until just in the past week where she had issues with bleeding from what appears to be varicose vein on the left leg medially. Unfortunately this has continued to be an issue although she tells me at first it was coming much more significantly Down quite a bit but nonetheless has not completely resolved. Every time she showers she notices that it starts to drain a little bit more. She does have a history of chronic venous insufficiency, lymphedema, varicose veins bilaterally, and obesity. 08/02/2020 upon evaluation today patient appears to be doing about the same in regards to the ulcer on her left leg. She has some eschar covering there is definitely some fluid collecting underneath unfortunately. With that being said she tells me she is still having a tremendous amount of pain therefore she is really not able to allow me to clean this off very effectively to be perfectly honest. I think we need to try to soften this up [name] [name] [name] J. ([id number]) Objective Constitutional Obese and well-hydrated in no acute distress. Vitals Time Taken: 1:00 PM, Height: 66 in, Weight: 388 lbs, BMI: 62.6, Temperature: 98.2 F, Pulse: 95 bpm, Respiratory Rate: 18 breaths/min, Blood Pressure: 117/84 mmHg. Respiratory normal breathing without difficulty. Psychiatric this patient is able to make decisions and demonstrates good insight into disease process. Alert and Oriented x 3. pleasant and cooperative. General Notes: Upon inspection patient's wound bed actually showed signs of poor granulation epithelization. She actually has eschar noted unfortunately at this point which I think is going to need to be addressed. With that being said she does not want me to perform any debridement due to the pain that she is  experiencing. I completely understand. With that being said I think that we need to continue to carefully address this currently. This means that we will get a try some medication I think Santyl could be a good option. We will also go ahead and see about using the [facility] over top. Integumentary (Hair, Skin) Wound #3 status is Open. Original cause of wound was Gradually Appeared. The date acquired was: 07/18/2020. The wound has been in treatment 1 weeks. The wound is located on the Left,Distal,Medial Lower Leg. The wound measures 0.4cm length x 0.6cm width x 0.1cm depth; 0.188cm^2 area and 0.019cm^3 volume. There is Fat Layer (Subcutaneous Tissue) exposed. There is no tunneling or undermining noted. There is a medium amount of serosanguineous drainage noted. There is small (1-33%) red granulation within the wound bed. There is a large (67-100%) amount of necrotic tissue within the wound bed including Adherent Slough. Assessment Active Problems ICD-10 Venous insufficiency (chronic) (peripheral) Lymphedema, not elsewhere classified Varicose veins of bilateral lower extremities with other complications Non-pressure chronic ulcer of skin of other sites with fat layer exposed Morbid (severe) obesity due to excess calories Procedures Wound #3 Pre-procedure diagnosis of Wound #3 is a Venous Leg Ulcer located on the Left,Distal,Medial Lower Leg . There was a Three Layer Compression Therapy Procedure with a pre-treatment ABI of 1.3 by Rogers BlockerSanchez, Kenia, RN. Post procedure Diagnosis Wound #3: Same as Pre-Procedure Plan Follow-up Appointments: Return Appointment in 2 weeks. Nurse Visit as needed - Next week Bathing/ Shower/  Hygiene: Wash wounds with antibacterial soap and water. Edema Control - Lymphedema / Segmental Compressive Device / Other: Optional: One layer of unna paste to top of compression wrap (to act as an anchor). 3 Layer Compression System for Lymphedema. WOUND #3: - Lower  Leg Wound Laterality: Left, Medial, Distal [name] [name] J. ([id number]) Cleanser: Soap and Water 1 x Per Week/15 Days Discharge Instructions: Gently cleanse wound with antibacterial soap, rinse and pat dry prior to dressing wounds Topical: Santyl Collagenase Ointment, 30 (gm), tube 1 x Per Week/15 Days Discharge Instructions: apply nickel thick to wound bed only Primary Dressing: Hydrofera Blue Ready Transfer Foam, 2.5x2.5 (in/in) 1 x Per Week/15 Days Discharge Instructions: Apply Hydrofera Blue Ready to wound bed as directed Primary Dressing: Xtrasorb Classic Super Absorbent Dressing, 4x5 (in/in) 1 x Per Week/15 Days Compression Wrap: Profore Lite LF 3 Multilayer Compression Bandaging System 1 x Per Week/15 Days Discharge Instructions: Apply 3 multi-layer wrap as prescribed. 1. Recommend that we go ahead and initiate treatment with Santyl to the wound bed followed by [facility] to cover and the patient is in agreement with that plan. 2. We will also get a lower to 3 layer compression wrap and see how this will do for her and the patient is in agreement with that plan. 3. I am also can recommend that we have the patient continue with the elevation of her leg is much as possible to try to keep edema under control I think that still of utmost importance. We will see patient back for reevaluation in 1 week here in the clinic. If anything worsens or changes patient will contact our office for additional recommendations. This will be a nurse visit and subsequent I will see her in 2 weeks for a provider visit. Electronic Signature(s) Signed: 08/02/2020 1:48:14 PM By: Lenda Kelp PA-C Entered By: Lenda Kelp on 08/02/2020 13:48:14 [name] [name] Patter ([id number]) -------------------------------------------------------------------------------- SuperBill Details Patient Name: [name] [name], HINDES Date of Service: 08/02/2020 Medical Record Number: [id number] Patient Account Number:  [id number] Date of Birth/Sex: Aug 26, 1985 (35 y.o. F) Treating RN: Rogers Blocker Primary Care Provider: Lenor Coffin Other Clinician: Lolita Cram Referring Provider: Lenor Coffin Treating Provider/Extender: Rowan Blase in Treatment: 1 Diagnosis Coding ICD-10 Codes Code Description I87.2 Venous insufficiency (chronic) (peripheral) I89.0 Lymphedema, not elsewhere classified I83.893 Varicose veins of bilateral lower extremities with other complications L98.492 Non-pressure chronic ulcer of skin of other sites with fat layer exposed E66.01 Morbid (severe) obesity due to excess calories Facility Procedures CPT4 Code: 08657846 Description: (Facility Use Only) [phone] - APPLY MULTLAY COMPRS LWR LT LEG Modifier: Quantity: 1 Physician Procedures CPT4 Code: 4132440 Description: 99213 - WC PHYS LEVEL 3 - EST PT Modifier: Quantity: 1 CPT4 Code: Description: ICD-10 Diagnosis Description I87.2 Venous insufficiency (chronic) (peripheral) I89.0 Lymphedema, not elsewhere classified I83.893 Varicose veins of bilateral lower extremities with other complications L98.492 Non-pressure chronic ulcer of  skin of other sites with fat layer expos Modifier: ed Quantity: Electronic Signature(s) Signed: 08/02/2020 1:48:29 PM By: Lenda Kelp PA-C Entered By: Lenda Kelp on 08/02/2020 13:48:29

## 2020-08-04 ENCOUNTER — Telehealth: Payer: Self-pay | Admitting: *Deleted

## 2020-08-04 NOTE — Telephone Encounter (Signed)
Pt calls to request something being called in for her chest congestion.   To PCP. Jone Baseman, CMA

## 2020-08-09 ENCOUNTER — Other Ambulatory Visit: Payer: Self-pay

## 2020-08-09 DIAGNOSIS — L98492 Non-pressure chronic ulcer of skin of other sites with fat layer exposed: Secondary | ICD-10-CM | POA: Diagnosis not present

## 2020-08-09 NOTE — Progress Notes (Signed)
HA, SHANNAHAN ([id number]) Visit Report for 08/09/2020 Arrival Information Details Patient Name: [name], [name]. Date of Service: 08/09/2020 12:00 PM Medical Record Number: [id number] Patient Account Number: [id number] Date of Birth/Sex: 1985/11/30 (34 y.o. F) Treating RN: Hansel Feinstein Primary Care Rakesha Dalporto: Lenor Coffin Other Clinician: Referring Viha Kriegel: Lenor Coffin Treating Angelicia Lessner/Extender: Altamese [place] in Treatment: 2 Visit Information History Since Last Visit Added or deleted any medications: No Patient Arrived: Ambulatory Had a fall or experienced change in No Arrival Time: 12:14 activities of daily living that may affect Accompanied By: self risk of falls: Transfer Assistance: None Hospitalized since last visit: No Patient Identification Verified: Yes Has Dressing in Place as Prescribed: Yes Secondary Verification Process Completed: Yes Has Compression in Place as Prescribed: Yes Patient Requires Transmission-Based Precautions: No Pain Present Now: Yes Patient Has Alerts: No Electronic Signature(s) Signed: 08/09/2020 12:39:32 PM By: Hansel Feinstein Entered By: Hansel Feinstein on 08/09/2020 12:16:41 Klasen, [name] -------------------------------------------------------------------------------- Clinic Level of Care Assessment Details Patient Name: [name], [name] Date of Service: 08/09/2020 12:00 PM Medical Record Number: [id number] Patient Account Number: [id number] Date of Birth/Sex: Mar 04, 1986 (34 y.o. F) Treating RN: Hansel Feinstein Primary Care Zaivion Kundrat: Lenor Coffin Other Clinician: Referring Genny Caulder: Lenor Coffin Treating Lorane Cousar/Extender: Altamese [place] in Treatment: 2 Clinic Level of Care Assessment Items TOOL 1 Quantity Score []  - Use when EandM and Procedure is performed on INITIAL visit 0 ASSESSMENTS - Nursing Assessment / Reassessment []  - General Physical Exam (combine w/ comprehensive assessment (listed just below)  when performed on new 0 pt. evals) []  - 0 Comprehensive Assessment (HX, ROS, Risk Assessments, Wounds Hx, etc.) ASSESSMENTS - Wound and Skin Assessment / Reassessment []  - Dermatologic / Skin Assessment (not related to wound area) 0 ASSESSMENTS - Ostomy and/or Continence Assessment and Care []  - Incontinence Assessment and Management 0 []  - 0 Ostomy Care Assessment and Management (repouching, etc.) PROCESS - Coordination of Care []  - Simple Patient / Family Education for ongoing care 0 []  - 0 Complex (extensive) Patient / Family Education for ongoing care []  - 0 Staff obtains , Records, Test Results / Process Orders []  - 0 Staff telephones HHA, Nursing Homes / Clarify orders / etc []  - 0 Routine Transfer to another Facility (non-emergent condition) []  - 0 Routine Hospital Admission (non-emergent condition) []  - 0 New Admissions / / Ordering NPWT, Apligraf, etc. []  - 0 Emergency Hospital Admission (emergent condition) PROCESS - Special Needs []  - Pediatric / Minor Patient Management 0 []  - 0 Isolation Patient Management []  - 0 Hearing / Language / Visual special needs []  - 0 Assessment of Community assistance (transportation, D/C planning, etc.) []  - 0 Additional assistance / Altered mentation []  - 0 Support Surface(s) Assessment (bed, cushion, seat, etc.) INTERVENTIONS - Miscellaneous []  - External ear exam 0 []  - 0 Patient Transfer (multiple staff / / Similar devices) []  - 0 Simple Staple / Suture removal (25 or less) []  - 0 Complex Staple / Suture removal (26 or more) []  - 0 Hypo/Hyperglycemic Management (do not check if billed separately) []  - 0 Ankle / Brachial Index (ABI) - do not check if billed separately Has the patient been seen at the hospital within the last three years: Yes Total Score: 0 Level Of Care: ____ ( ) Electronic Signature(s) Signed: 08/09/2020 12:39:32 PM By: Entered By: on 08/09/2020 12:35:55 Silas, Chiropractor ( ) -------------------------------------------------------------------------------- Compression Therapy Details Patient Name:  J. Date of Service: 08/09/2020 12:00 PM Medical Record Number: [id number] Patient Account Number: [id number] Date of Birth/Sex: October 07, 1985 (34 y.o. F) Treating RN: Hansel Feinstein Primary Care Kenzli Barritt: Lenor Coffin Other Clinician: Referring Teancum Brule: Lenor Coffin Treating Aleenah Homen/Extender: Altamese [place] in Treatment: 2 Compression Therapy Performed for Wound Assessment: Wound #3 Left,Distal,Medial Lower Leg Performed By: Clinician Hansel Feinstein, RN Compression Type: Three Layer Electronic Signature(s) Signed: 08/09/2020 12:39:32 PM By: Hansel Feinstein Entered By: Hansel Feinstein on 08/09/2020 12:31:18 Slavick, [name] ([id number]) -------------------------------------------------------------------------------- Encounter Discharge Information Details Patient Name: [name], [name] Date of Service: 08/09/2020 12:00 PM Medical Record Number: [id number] Patient Account Number: [id number] Date of Birth/Sex: 08-28-1985 (34 y.o. F) Treating RN: Hansel Feinstein Primary Care Nhi Butrum: Lenor Coffin Other Clinician: Referring Zaahir Pickney: Lenor Coffin Treating Daizha Anand/Extender: Altamese [place] in Treatment: 2 Encounter Discharge Information Items Discharge Condition: Stable Ambulatory Status: Ambulatory Discharge Destination: Home Transportation: Private Auto Accompanied By: self Schedule Follow-up Appointment: Yes Clinical Summary of Care: Electronic Signature(s) Signed: 08/09/2020 12:39:32 PM By: Hansel Feinstein Entered By: Hansel Feinstein on 08/09/2020 12:35:36 Korn, [name] ([id number]) -------------------------------------------------------------------------------- Wound Assessment Details Patient Name: [name], [name]. Date of Service: 08/09/2020 12:00 PM Medical  Record Number: [id number] Patient Account Number: [id number] Date of Birth/Sex: Jan 15, 1986 (34 y.o. F) Treating RN: Hansel Feinstein Primary Care Demontrae Gilbert: Lenor Coffin Other Clinician: Referring Whitfield Dulay: Lenor Coffin Treating Johnthan Axtman/Extender: Altamese [place] in Treatment: 2 Wound Status Wound Number: 3 Primary Etiology: Venous Leg Ulcer Wound Location: Left, Distal, Medial Lower Leg Secondary Etiology: Lymphedema Wounding Event: Gradually Appeared Wound Status: Open Date Acquired: 07/18/2020 Comorbid History: Hypertension, Peripheral Venous Disease Weeks Of Treatment: 2 Clustered Wound: No Wound Measurements Length: (cm) 0.4 Width: (cm) 0.6 Depth: (cm) 0.1 Area: (cm) 0.188 Volume: (cm) 0.019 % Reduction in Area: -506.5% % Reduction in Volume: -533.3% Epithelialization: None Wound Description Classification: Full Thickness Without Exposed Support Structures Exudate Amount: Medium Exudate Type: Serosanguineous Exudate Color: red, brown Foul Odor After Cleansing: No Slough/Fibrino Yes Wound Bed Granulation Amount: Small (1-33%) Exposed Structure Granulation Quality: Red Fascia Exposed: No Necrotic Amount: Large (67-100%) Fat Layer (Subcutaneous Tissue) Exposed: Yes Necrotic Quality: Adherent Slough Tendon Exposed: No Muscle Exposed: No Joint Exposed: No Bone Exposed: No Treatment Notes Wound #3 (Lower Leg) Wound Laterality: Left, Medial, Distal Cleanser Soap and Water Discharge Instruction: Gently cleanse wound with antibacterial soap, rinse and pat dry prior to dressing wounds Peri-Wound Care Topical Santyl Collagenase Ointment, 30 (gm), tube Discharge Instruction: apply nickel thick to wound bed only Primary Dressing Hydrofera Blue Ready Transfer Foam, 2.5x2.5 (in/in) Discharge Instruction: Apply Hydrofera Blue Ready to wound bed as directed [employer], 4x5 (in/in) Secondary Dressing Secured With Compression  Wrap Profore Lite LF 3 Multilayer Compression Bandaging System ANNITA, RATLIFF ([id number]) Discharge Instruction: Apply 3 multi-layer wrap as prescribed. Compression Stockings Add-Ons Electronic Signature(s) Signed: 08/09/2020 12:39:32 PM By: Hansel Feinstein Entered ByHansel Feinstein on 08/09/2020 12:16:59

## 2020-08-10 ENCOUNTER — Ambulatory Visit: Payer: BC Managed Care – PPO

## 2020-08-10 NOTE — Progress Notes (Deleted)
    SUBJECTIVE:   CHIEF COMPLAINT / HPI:   Congestion  Seen with Dr. Constance Goltz on 08/01/20 for nasal congestion and symptoms consistent with allergic rhinitis. Sent home with zyrtec and flonase.  She reports that symptoms have ***. She reports using zyrtec daily *** and flonase *** sprays *** daily. She denies any medication side effects. She reports no/little *** improvement with these interventions. Her biggest symptom complaint is ***.   PERTINENT  PMH / PSH: ***  OBJECTIVE:   There were no vitals taken for this visit.  ***  ASSESSMENT/PLAN:   No problem-specific Assessment & Plan notes found for this encounter.     Melene Plan, MD [place] [facility]   {    This will disappear when note is signed, click to select method of visit    :1}

## 2020-08-10 NOTE — Progress Notes (Signed)
NICHA, HEMANN ([id number]) Visit Report for 08/02/2020 Arrival Information Details Patient Name: [name], [name]. Date of Service: 08/02/2020 12:45 PM Medical Record Number: [id number] Patient Account Number: [id number] Date of Birth/Sex: 1985/12/04 (35 y.o. F) Treating RN: Yevonne Pax Primary Care Ilyanna Baillargeon: Lenor Coffin Other Clinician: Lolita Cram Referring Joziah Dollins: Lenor Coffin Treating Aedyn Mckeon/Extender: Rowan Blase in Treatment: 1 Visit Information History Since Last Visit All ordered tests and consults were completed: No Patient Arrived: Ambulatory Added or deleted any medications: No Arrival Time: 12:56 Any new allergies or adverse reactions: No Accompanied By: self Had a fall or experienced change in No Transfer Assistance: None activities of daily living that may affect Patient Identification Verified: Yes risk of falls: Secondary Verification Process Completed: Yes Signs or symptoms of abuse/neglect since last visito No Patient Requires Transmission-Based Precautions: No Hospitalized since last visit: No Patient Has Alerts: No Implantable device outside of the clinic excluding No cellular tissue based products placed in the center since last visit: Has Dressing in Place as Prescribed: Yes Has Compression in Place as Prescribed: No Pain Present Now: Yes Electronic Signature(s) Signed: 08/10/2020 3:51:10 PM By: Yevonne Pax RN Entered By: Yevonne Pax on 08/02/2020 13:03:35 Crew, [name] ([id number]) -------------------------------------------------------------------------------- Clinic Level of Care Assessment Details Patient Name: [name], [name] Date of Service: 08/02/2020 12:45 PM Medical Record Number: [id number] Patient Account Number: [id number] Date of Birth/Sex: Apr 04, 1986 (35 y.o. F) Treating RN: Rogers Blocker Primary Care Trinten Boudoin: Lenor Coffin Other Clinician: Lolita Cram Referring Tyrihanna Wingert: Lenor Coffin Treating  Ayo Guarino/Extender: Rowan Blase in Treatment: 1 Clinic Level of Care Assessment Items TOOL 1 Quantity Score []  - Use when EandM and Procedure is performed on INITIAL visit 0 ASSESSMENTS - Nursing Assessment / Reassessment []  - General Physical Exam (combine w/ comprehensive assessment (listed just below) when performed on new 0 pt. evals) []  - 0 Comprehensive Assessment (HX, ROS, Risk Assessments, Wounds Hx, etc.) ASSESSMENTS - Wound and Skin Assessment / Reassessment []  - Dermatologic / Skin Assessment (not related to wound area) 0 ASSESSMENTS - Ostomy and/or Continence Assessment and Care []  - Incontinence Assessment and Management 0 []  - 0 Ostomy Care Assessment and Management (repouching, etc.) PROCESS - Coordination of Care []  - Simple Patient / Family Education for ongoing care 0 []  - 0 Complex (extensive) Patient / Family Education for ongoing care []  - 0 Staff obtains , Records, Test Results / Process Orders []  - 0 Staff telephones HHA, Nursing Homes / Clarify orders / etc []  - 0 Routine Transfer to another Facility (non-emergent condition) []  - 0 Routine Hospital Admission (non-emergent condition) []  - 0 New Admissions / / Ordering NPWT, Apligraf, etc. []  - 0 Emergency Hospital Admission (emergent condition) PROCESS - Special Needs []  - Pediatric / Minor Patient Management 0 []  - 0 Isolation Patient Management []  - 0 Hearing / Language / Visual special needs []  - 0 Assessment of Community assistance (transportation, D/C planning, etc.) []  - 0 Additional assistance / Altered mentation []  - 0 Support Surface(s) Assessment (bed, cushion, seat, etc.) INTERVENTIONS - Miscellaneous []  - External ear exam 0 []  - 0 Patient Transfer (multiple staff / / Similar devices) []  - 0 Simple Staple / Suture removal (25 or less) []  - 0 Complex Staple / Suture removal (26 or more) []  - 0 Hypo/Hyperglycemic Management (do not  check if billed separately) []  - 0 Ankle / Brachial Index (ABI) - do not check if billed separately Has the patient been seen at the  hospital within the last three years: Yes Total Score: 0 Level Of Care: ____ [name] ([id number]) Electronic Signature(s) Signed: 08/02/2020 5:05:23 PM By: Phillis Haggis, Dondra Prader RN Entered By: Phillis Haggis, Dondra Prader on 08/02/2020 13:32:20 [name], [name] ([id number]) -------------------------------------------------------------------------------- Compression Therapy Details Patient Name: [name], [name]. Date of Service: 08/02/2020 12:45 PM Medical Record Number: [id number] Patient Account Number: [id number] Date of Birth/Sex: 1985/04/29 (35 y.o. F) Treating RN: Rogers Blocker Primary Care Zoraida Havrilla: Lenor Coffin Other Clinician: Lolita Cram Referring Raimundo Corbit: Lenor Coffin Treating Rona Tomson/Extender: Allen Derry Weeks in Treatment: 1 Compression Therapy Performed for Wound Assessment: Wound #3 Left,Distal,Medial Lower Leg Performed By: Clinician Rogers Blocker, RN Compression Type: Three Layer Pre Treatment ABI: 1.3 Post Procedure Diagnosis Same as Pre-procedure Electronic Signature(s) Signed: 08/02/2020 5:05:23 PM By: Phillis Haggis, Dondra Prader RN Entered By: Phillis Haggis, Dondra Prader on 08/02/2020 13:32:02 Bixby, [name] ([id number]) -------------------------------------------------------------------------------- Encounter Discharge Information Details Patient Name: [name], [name]. Date of Service: 08/02/2020 12:45 PM Medical Record Number: [id number] Patient Account Number: [id number] Date of Birth/Sex: January 19, 1986 (35 y.o. F) Treating RN: Rogers Blocker Primary Care Yahia Bottger: Lenor Coffin Other Clinician: Lolita Cram Referring Mychelle Kendra: Lenor Coffin Treating Hammad Finkler/Extender: Rowan Blase in Treatment: 1 Encounter Discharge Information Items Discharge Condition: Stable Ambulatory Status: Ambulatory Discharge  Destination: Home Transportation: Private Auto Accompanied By: self Schedule Follow-up Appointment: Yes Clinical Summary of Care: Electronic Signature(s) Signed: 08/02/2020 4:09:29 PM By: Lolita Cram Entered By: Lolita Cram on 08/02/2020 13:53:16 Liverman, [name] ([id number]) -------------------------------------------------------------------------------- Lower Extremity Assessment Details Patient Name: MARYJAYNE, KLEVEN Date of Service: 08/02/2020 12:45 PM Medical Record Number: [id number] Patient Account Number: [id number] Date of Birth/Sex: [birth year] (35 y.o. F) Treating RN: Yevonne Pax Primary Care Bryen Hinderman: Lenor Coffin Other Clinician: Lolita Cram Referring Gaylynn Seiple: Lenor Coffin Treating Autumm Hattery/Extender: Allen Derry Weeks in Treatment: 1 Edema Assessment Assessed: [Left: No] [Right: No] Edema: [Left: Ye] [Right: s] Calf Left: Right: Point of Measurement: 31 cm From Medial Instep 58 cm Ankle Left: Right: Point of Measurement: 9 cm From Medial Instep 33 cm Knee To Floor Left: Right: From Medial Instep 44 cm Vascular Assessment Pulses: Dorsalis Pedis Palpable: [Left:Yes] Electronic Signature(s) Signed: 08/10/2020 3:51:10 PM By: Yevonne Pax RN Entered By: Yevonne Pax on 08/02/2020 13:07:01 Gunby, [name] ([id number]) -------------------------------------------------------------------------------- Multi Wound Chart Details Patient Name: SHAMELL, HITTLE Date of Service: 08/02/2020 12:45 PM Medical Record Number: [id number] Patient Account Number: [id number] Date of Birth/Sex: 11-12-1985 (35 y.o. F) Treating RN: Rogers Blocker Primary Care Constance Hackenberg: Lenor Coffin Other Clinician: Lolita Cram Referring Jeanell Mangan: Lenor Coffin Treating Tisheena Maguire/Extender: Rowan Blase in Treatment: 1 Vital Signs Height(in): 66 Pulse(bpm): 95 Weight(lbs): 388 Blood Pressure(mmHg): 117/84 Body Mass Index(BMI): 63 Temperature(F): 98.2 Respiratory  Rate(breaths/min): 18 Photos: [N/A:N/A] Wound Location: Left, Distal, Medial Lower Leg N/A N/A Wounding Event: Gradually Appeared N/A N/A Primary Etiology: Venous Leg Ulcer N/A N/A Secondary Etiology: Lymphedema N/A N/A Comorbid History: Hypertension, Peripheral Venous N/A N/A Disease Date Acquired: 07/18/2020 N/A N/A Weeks of Treatment: 1 N/A N/A Wound Status: Open N/A N/A Measurements L x W x D (cm) 0.4x0.6x0.1 N/A N/A Area (cm) : 0.188 N/A N/A Volume (cm) : 0.019 N/A N/A % Reduction in Area: -506.50% N/A N/A % Reduction in Volume: -533.30% N/A N/A Classification: Full Thickness Without Exposed N/A N/A Support Structures Exudate Amount: Medium N/A N/A Exudate Type: Serosanguineous N/A N/A Exudate Color: red, brown N/A N/A Granulation Amount: Small (1-33%) N/A N/A Granulation Quality: Red N/A N/A Necrotic Amount: Large (67-100%) N/A N/A Exposed Structures: Fat Layer (Subcutaneous Tissue):  N/A N/A Yes Fascia: No Tendon: No Muscle: No Joint: No Bone: No Epithelialization: None N/A N/A Treatment Notes Electronic Signature(s) Signed: 08/02/2020 5:05:23 PM By: Phillis HaggisSanchez Pereyda, Dondra PraderKenia RN Entered By: Phillis HaggisSanchez Pereyda, Dondra PraderKenia on 08/02/2020 13:26:18 Lesniak, [name] ([id number]) -------------------------------------------------------------------------------- Multi-Disciplinary Care Plan Details Patient Name: Conley SimmondsSUMMERS, Jurnee J. Date of Service: 08/02/2020 12:45 PM Medical Record Number: [id number] Patient Account Number: [id number] Date of Birth/Sex: 11/02/1985 (35 y.o. F) Treating RN: Rogers BlockerSanchez, Kenia Primary Care Shyah Cadmus: Lenor Coffinlson, Daniel Other Clinician: Lolita CramBurnette, Kyara Referring Daneille Desilva: Lenor Coffinlson, Daniel Treating Loucille Takach/Extender: Rowan BlaseStone, Hoyt Weeks in Treatment: 1 Active Inactive Wound/Skin Impairment Nursing Diagnoses: Impaired tissue integrity Goals: Patient/caregiver will verbalize understanding of skin care regimen Date Initiated: 07/26/2020 Target Resolution Date:  07/26/2020 Goal Status: Active Ulcer/skin breakdown will have a volume reduction of 30% by week 4 Date Initiated: 07/26/2020 Target Resolution Date: 08/25/2020 Goal Status: Active Ulcer/skin breakdown will have a volume reduction of 50% by week 8 Date Initiated: 07/26/2020 Target Resolution Date: 09/25/2020 Goal Status: Active Ulcer/skin breakdown will have a volume reduction of 80% by week 12 Date Initiated: 07/26/2020 Target Resolution Date: 10/25/2020 Goal Status: Active Ulcer/skin breakdown will heal within 14 weeks Date Initiated: 07/26/2020 Target Resolution Date: 11/25/2020 Goal Status: Active Interventions: Assess patient/caregiver ability to obtain necessary supplies Assess patient/caregiver ability to perform ulcer/skin care regimen upon admission and as needed Assess ulceration(s) every visit Provide education on ulcer and skin care Treatment Activities: Referred to DME Leasa Kincannon for dressing supplies : 07/26/2020 Skin care regimen initiated : 07/26/2020 Notes: Electronic Signature(s) Signed: 08/02/2020 5:05:23 PM By: Phillis HaggisSanchez Pereyda, Dondra PraderKenia RN Entered By: Phillis HaggisSanchez Pereyda, Dondra PraderKenia on 08/02/2020 13:26:04 Conley SimmondsSUMMERS, Keshayla J. ([id number]) -------------------------------------------------------------------------------- Pain Assessment Details Patient Name: Conley SimmondsSUMMERS, Saidah J. Date of Service: 08/02/2020 12:45 PM Medical Record Number: [id number] Patient Account Number: [id number] Date of Birth/Sex: 09/22/1985 (35 y.o. F) Treating RN: Yevonne PaxEpps, [name] Primary Care Tamea Bai: Lenor Coffinlson, Daniel Other Clinician: Lolita CramBurnette, Kyara Referring Nasya Vincent: Lenor Coffinlson, Daniel Treating Kijana Cromie/Extender: Rowan BlaseStone, Hoyt Weeks in Treatment: 1 Active Problems Location of Pain Severity and Description of Pain Patient Has Paino Yes Site Locations With Dressing Change: Yes Duration of the Pain. Constant / Intermittento Intermittent How Long Does it Lasto Hours: Minutes: 15 Rate the pain. Current Pain Level: 4 Worst  Pain Level: 6 Least Pain Level: 0 Tolerable Pain Level: 5 Character of Pain Describe the Pain: Aching, Burning Pain Management and Medication Current Pain Management: Medication: Yes Cold Application: No Rest: Yes Massage: No Activity: No T.E.N.S.: No Heat Application: No Leg drop or elevation: No Is the Current Pain Management Adequate: Inadequate How does your wound impact your activities of daily livingo Sleep: Yes Bathing: No Appetite: No Relationship With Others: No Bladder Continence: No Emotions: No Bowel Continence: No Work: No Toileting: No Drive: No Dressing: No Hobbies: No Electronic Signature(s) Signed: 08/10/2020 3:51:10 PM By: Yevonne PaxEpps, [name] RN Entered By: Yevonne PaxEpps, [name] on 08/02/2020 13:04:41 Ida, [name] ([id number]) -------------------------------------------------------------------------------- Patient/Caregiver Education Details Patient Name: Conley SimmondsSUMMERS, Deonne J. Date of Service: 08/02/2020 12:45 PM Medical Record Number: [id number] Patient Account Number: [id number] Date of Birth/Gender: 11/17/1985 (35 y.o. F) Treating RN: Rogers BlockerSanchez, Kenia Primary Care Physician: Lenor Coffinlson, Daniel Other Clinician: Lolita CramBurnette, Kyara Referring Physician: Lenor Coffinlson, Daniel Treating Physician/Extender: Rowan BlaseStone, Hoyt Weeks in Treatment: 1 Education Assessment Education Provided To: Patient Education Topics Provided Wound/Skin Impairment: Methods: Explain/Verbal Responses: State content correctly Electronic Signature(s) Signed: 08/02/2020 5:05:23 PM By: Phillis HaggisSanchez Pereyda, Dondra PraderKenia RN Entered By: Phillis HaggisSanchez Pereyda, Dondra PraderKenia on 08/02/2020 13:32:37 Calia, [name] ([id number]) -------------------------------------------------------------------------------- Wound Assessment Details Patient Name: Conley SimmondsSUMMERS, Nattaly J.  Date of Service: 08/02/2020 12:45 PM Medical Record Number: [id number] Patient Account Number: [id number] Date of Birth/Sex: 01-29-1986 (35 y.o. F) Treating RN: Yevonne Pax Primary  Care Shernita Rabinovich: Lenor Coffin Other Clinician: Lolita Cram Referring Symia Herdt: Lenor Coffin Treating Wilna Pennie/Extender: Allen Derry Weeks in Treatment: 1 Wound Status Wound Number: 3 Primary Etiology: Venous Leg Ulcer Wound Location: Left, Distal, Medial Lower Leg Secondary Etiology: Lymphedema Wounding Event: Gradually Appeared Wound Status: Open Date Acquired: 07/18/2020 Comorbid History: Hypertension, Peripheral Venous Disease Weeks Of Treatment: 1 Clustered Wound: No Photos Wound Measurements Length: (cm) 0.4 Width: (cm) 0.6 Depth: (cm) 0.1 Area: (cm) 0.188 Volume: (cm) 0.019 % Reduction in Area: -506.5% % Reduction in Volume: -533.3% Epithelialization: None Tunneling: No Undermining: No Wound Description Classification: Full Thickness Without Exposed Support Structu Exudate Amount: Medium Exudate Type: Serosanguineous Exudate Color: red, brown res Foul Odor After Cleansing: No Slough/Fibrino Yes Wound Bed Granulation Amount: Small (1-33%) Exposed Structure Granulation Quality: Red Fascia Exposed: No Necrotic Amount: Large (67-100%) Fat Layer (Subcutaneous Tissue) Exposed: Yes Necrotic Quality: Adherent Slough Tendon Exposed: No Muscle Exposed: No Joint Exposed: No Bone Exposed: No Electronic Signature(s) Signed: 08/10/2020 3:51:10 PM By: Yevonne Pax RN Entered By: Yevonne Pax on 08/02/2020 13:05:53 Wessinger, [name] ([id number]) -------------------------------------------------------------------------------- Vitals Details Patient Name: [name] Date of Service: 08/02/2020 12:45 PM Medical Record Number: [id number] Patient Account Number: [id number] Date of Birth/Sex: [birth year] (35 y.o. F) Treating RN: Yevonne Pax Primary Care Melayah Skorupski: Lenor Coffin Other Clinician: Lolita Cram Referring Kadon Andrus: Lenor Coffin Treating Cedarius Kersh/Extender: Rowan Blase in Treatment: 1 Vital Signs Time Taken: 13:00 Temperature (F):  98.2 Height (in): 66 Pulse (bpm): 95 Weight (lbs): 388 Respiratory Rate (breaths/min): 18 Body Mass Index (BMI): 62.6 Blood Pressure (mmHg): 117/84 Reference Range: 80 - 120 mg / dl Electronic Signature(s) Signed: 08/10/2020 3:51:10 PM By: Yevonne Pax RN Entered By: Yevonne Pax on 08/02/2020 13:03:54

## 2020-08-11 ENCOUNTER — Ambulatory Visit: Payer: BC Managed Care – PPO

## 2020-08-11 NOTE — Telephone Encounter (Signed)
Zyrtec and flonase were sent in during our recent visit for this complaint, which was most consistent with seasonal allergies.

## 2020-08-15 ENCOUNTER — Ambulatory Visit: Payer: BC Managed Care – PPO

## 2020-08-15 NOTE — Progress Notes (Deleted)
    SUBJECTIVE:   CHIEF COMPLAINT / HPI:   Chest congestion/cough: pt states zyrtec and flonase not helping.   PERTINENT  PMH / PSH: ***  OBJECTIVE:   There were no vitals taken for this visit.  ***  ASSESSMENT/PLAN:   No problem-specific Assessment & Plan notes found for this encounter.     Sandre Kitty, MD [facility]   {    This will disappear when note is signed, click to select method of visit    :1}

## 2020-08-16 ENCOUNTER — Encounter: Payer: BC Managed Care – PPO | Attending: Physician Assistant | Admitting: Physician Assistant

## 2020-08-16 ENCOUNTER — Other Ambulatory Visit: Payer: Self-pay

## 2020-08-16 DIAGNOSIS — Z6841 Body Mass Index (BMI) 40.0 and over, adult: Secondary | ICD-10-CM | POA: Diagnosis not present

## 2020-08-16 DIAGNOSIS — I872 Venous insufficiency (chronic) (peripheral): Secondary | ICD-10-CM | POA: Diagnosis present

## 2020-08-16 DIAGNOSIS — I890 Lymphedema, not elsewhere classified: Secondary | ICD-10-CM | POA: Diagnosis not present

## 2020-08-16 DIAGNOSIS — L98492 Non-pressure chronic ulcer of skin of other sites with fat layer exposed: Secondary | ICD-10-CM | POA: Insufficient documentation

## 2020-08-16 DIAGNOSIS — I10 Essential (primary) hypertension: Secondary | ICD-10-CM | POA: Diagnosis not present

## 2020-08-16 NOTE — Progress Notes (Addendum)
[name], [name] ([id number]) Visit Report for 08/16/2020 Chief Complaint Document Details Patient Name: [name], [name] Date of Service: 08/16/2020 2:00 PM Medical Record Number: [id number] Patient Account Number: [id number] Date of Birth/Sex: 07/13/1985 (35 y.o. F) Treating RN: Rogers BlockerSanchez, Kenia Primary Care Provider: Lenor Coffinlson, Daniel Other Clinician: Lolita CramBurnette, Kyara Referring Provider: Lenor Coffinlson, Daniel Treating Provider/Extender: Rowan BlaseStone, Lotoya Casella Weeks in Treatment: 3 Information Obtained from: Patient Chief Complaint Left LE Ulcer Electronic Signature(s) Signed: 08/16/2020 2:22:42 PM By: Lenda KelpStone III, Marykatherine Sherwood PA-C Entered By: Lenda KelpStone III, Luisdavid Hamblin on 08/16/2020 14:22:42 Steege, [name] ([id number]) -------------------------------------------------------------------------------- HPI Details Patient Name: [name], [name] Date of Service: 08/16/2020 2:00 PM Medical Record Number: [id number] Patient Account Number: [id number] Date of Birth/Sex: 09/20/1985 (35 y.o. F) Treating RN: Rogers BlockerSanchez, Kenia Primary Care Provider: Lenor Coffinlson, Daniel Other Clinician: Lolita CramBurnette, Kyara Referring Provider: Lenor Coffinlson, Daniel Treating Provider/Extender: Rowan BlaseStone, Sharronda Schweers Weeks in Treatment: 3 History of Present Illness HPI Description: 35 year old patient who started with having ulcerations on the right lower leg on the lateral part of her ankle for about 2 weeks. [facility] and advised to see the wound care for a consultation. No X-rays of workup was done during the ER visit and no prescription for any medications of compression wraps were given. the patient is not diabetic but does have hypertension and her medications have been reviewed by me. 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 The right and left greater saphenous vein was found to be  tortuous. Deep venous system was also not competent and there was reflux of more than 500 ms. She was then seen by Dr. Tawanna Coolerodd Early who recommended that the patient would not benefit from endovenous ablation and he had recommended vein stripping on the right side and multiple small phlebectomy procedures on the left side. the patient did not follow-up due to social economic reasons. She has not been wearing any compression stockings and has not taken any specific treatment for varicose veins for the last 3 years. 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 We have obtained some appointments to see the vascular surgeons by the end of the month and the patient would like to follow up with [facility] on Wednesday, June 29. 10/14/2014 -- she could not see me yesterday in [place] and hence has come for a review today. [facility]. She is doing fine otherwise. 10/22/2014 -- she was seen by Dr. Gretta Beganodd Early and he has recommended surgical removal of her right saphenous vein from distal thigh to saphenofemoral junction and stab phlebectomy's of multiple large tributary branches throughout her thigh and calf. This would be done under general anesthesia in the outpatient setting. 10/29/2014 -- she is trying to work on a surgical date and in the meanwhile we have got insurance clearance for Apligraf and we will start this next week. 7/22 2016 -- she is here for the first application of Apligraf. 11/19/2014 -- she is here for a second application of Apligraf 11/26/2014 -- she has done fine after her last application of Apligraf and is awaiting her surgery which is scheduled for August 31. 12/03/2014 -- she is doing fine and is here  for her third application of Apligraf. 12/21/2014 -- She had surgery on 12/15/2014 by Dr.Early who did #1  ligation and stripping of right great saphenous vein from distal thigh to saphenofemoral junction, #2 stab phlebectomy of large tributary varicose veins in the thigh popliteal space and calf. She had an Ace wrap up to her groin and this was removed today and the Unna's boot was also removed. 12/28/2014 -- she is here for her fourth application of Apligraf. 01/06/2015 - he saw her vascular surgeon Dr. Arbie Cookey who was pleased with her progress and he has confirmed that no surgical procedures could be attempted on the left side. 01/13/2015 -- her wound looks very good and she's been having no problems whatsoever. Readmission: 07/26/2020 upon evaluation today patient presents for initial inspection here in our clinic for a new issue with her left leg although she is previously been seen due to issues with the right leg back in 2016. At that time she was seeing Dr. Arbie Cookey who is a vein/vascular specialist in [place]. He has since semiretired from what I understand. He is working out of [employer] I believe. Nonetheless she tells me at the time that there was really nothing to do for her left leg although the right leg was where they did most of the work. Subsequently she states that she is done fairly well until just in the past week where she had issues with bleeding from what appears to be varicose vein on the left leg medially. Unfortunately this has continued to be an issue although she tells me at first it was coming much more significantly Down quite a bit but nonetheless has not completely resolved. Every time she showers she notices that it starts to drain a little bit more. She does have a history of chronic venous insufficiency, lymphedema, varicose veins bilaterally, and obesity. 08/02/2020 upon evaluation today patient appears to be doing about the same in regards to the ulcer on her left leg. She has some eschar covering there is definitely some fluid collecting underneath unfortunately. With  that being said she tells me she is still having a tremendous amount of pain therefore she is really not able to allow me to clean this off very effectively to be perfectly honest. I think we need to try to soften this up 08/16/2020 upon evaluation today patient's wound is really not doing significantly better not really states about the same. She notes that the wrap just does not seem to be staying up very well at all unfortunately. No fevers, chills, nausea, vomiting, or diarrhea. She did cut it off once it starts to slide in order to alleviate some of the pressure from sliding Down. Fortunately there is no signs of active infection at this time which is great news. Electronic Signature(s) Signed: 08/16/2020 2:51:57 PM By: Lenda Kelp PA-C Entered By: Lenda Kelp on 08/16/2020 14:51:57 [name], [name] ([id number]) [name], [name] ([id number]) -------------------------------------------------------------------------------- Physical Exam Details Patient Name: [name], ROGALSKI. Date of Service: 08/16/2020 2:00 PM Medical Record Number: [id number] Patient Account Number: [id number] Date of Birth/Sex: [birth year] (35 y.o. F) Treating RN: Rogers Blocker Primary Care Provider: Lenor Coffin Other Clinician: Lolita Cram Referring Provider: Lenor Coffin Treating Provider/Extender: Allen Derry Weeks in Treatment: 3 Constitutional Well-nourished and well-hydrated in no acute distress. Respiratory normal breathing without difficulty. Psychiatric this patient is able to make decisions and demonstrates good insight into disease process. Alert and Oriented x 3. pleasant and cooperative. Notes Upon inspection patient's wound bed showed  signs of good granulation epithelization at this point in some areas although there is still some slough noted I am a little reluctant to go to hard as far as sharp debridement is concerned as this is over one of the varicose veins I do not want to bleed  significantly obviously. With that being said I think possibly switching to Iodoflex would be beneficial. Electronic Signature(s) Signed: 08/16/2020 2:52:20 PM By: Lenda Kelp PA-C Entered By: Lenda Kelp on 08/16/2020 14:52:19 Bainter, [name] ([id number]) -------------------------------------------------------------------------------- Physician Orders Details Patient Name: [name], [name] Date of Service: 08/16/2020 2:00 PM Medical Record Number: [id number] Patient Account Number: [id number] Date of Birth/Sex: 1986/01/08 (35 y.o. F) Treating RN: [name] Primary Care Provider: Lenor Coffin Other Clinician: Lolita Cram Referring Provider: Lenor Coffin Treating Provider/Extender: Rowan Blase in Treatment: 3 Verbal / Phone Orders: No Diagnosis Coding ICD-10 Coding Code Description I87.2 Venous insufficiency (chronic) (peripheral) I89.0 Lymphedema, not elsewhere classified I83.893 Varicose veins of bilateral lower extremities with other complications L98.492 Non-pressure chronic ulcer of skin of other sites with fat layer exposed E66.01 Morbid (severe) obesity due to excess calories Follow-up Appointments o Return Appointment in 1 week. Bathing/ Shower/ Hygiene o Wash wounds with antibacterial soap and water. Edema Control - Lymphedema / Segmental Compressive Device / Other Left Lower Extremity o Elevate, Exercise Daily and Avoid Standing for Long Periods of Time. o Elevate legs to the level of the heart and pump ankles as often as possible o Elevate leg(s) parallel to the floor when sitting. Wound Treatment Wound #3 - Lower Leg Wound Laterality: Left, Medial, Distal Cleanser: Soap and Water 1 x Per Week/15 Days Discharge Instructions: Gently cleanse wound with antibacterial soap, rinse and pat dry prior to dressing wounds Primary Dressing: Iodosorb 40 (g) (DME) (Generic) 1 x Per Week/15 Days Discharge Instructions: Apply IodoSorb to wound bed only as  directed. Secondary Dressing: ABD Pad 5x9 (in/in) 1 x Per Week/15 Days Discharge Instructions: Cover with ABD pad Secured With: 80M ACE Elastic Bandage With VELCRO Brand Closure, 4 (in) 1 x Per Week/15 Days Electronic Signature(s) Signed: 08/16/2020 4:59:24 PM By: Lenda Kelp PA-C Signed: 08/17/2020 6:13:14 PM By: Elliot Gurney, [name], RN, CWS, [name], [name] Entered By: Elliot Gurney, [name], RN, CWS, Kim on 08/16/2020 14:55:07 Enke, [name] ([id number]) -------------------------------------------------------------------------------- Problem List Details Patient Name: [name], SCHMALTZ. Date of Service: 08/16/2020 2:00 PM Medical Record Number: [id number] Patient Account Number: [id number] Date of Birth/Sex: 27-Feb-1986 (35 y.o. F) Treating RN: Rogers Blocker Primary Care Provider: Lenor Coffin Other Clinician: Lolita Cram Referring Provider: Lenor Coffin Treating Provider/Extender: Rowan Blase in Treatment: 3 Active Problems ICD-10 Encounter Code Description Active Date MDM Diagnosis I87.2 Venous insufficiency (chronic) (peripheral) 07/26/2020 No Yes I89.0 Lymphedema, not elsewhere classified 07/26/2020 No Yes I83.893 Varicose veins of bilateral lower extremities with other complications 07/26/2020 No Yes L98.492 Non-pressure chronic ulcer of skin of other sites with fat layer exposed 07/26/2020 No Yes E66.01 Morbid (severe) obesity due to excess calories 07/26/2020 No Yes Inactive Problems Resolved Problems Electronic Signature(s) Signed: 08/16/2020 2:22:36 PM By: Lenda Kelp PA-C Entered By: Lenda Kelp on 08/16/2020 14:22:36 Stencil, [name] ([id number]) -------------------------------------------------------------------------------- Progress Note Details Patient Name: [name] Date of Service: 08/16/2020 2:00 PM Medical Record Number: [id number] Patient Account Number: [id number] Date of Birth/Sex: 11-28-1985 (35 y.o. F) Treating RN: Rogers Blocker Primary Care Provider:  Lenor Coffin Other Clinician: Lolita Cram Referring Provider: Lenor Coffin Treating Provider/Extender: Allen Derry Weeks in Treatment: 3 Subjective  Chief Complaint Information obtained from Patient Left LE Ulcer History of Present Illness (HPI) 35 year old patient who started with having ulcerations on the right lower leg on the lateral part of her ankle for about 2 weeks. She was seen in the ER at [facility] and advised to see the wound care for a consultation. No X-rays of workup was done during the ER visit and no prescription for any medications of compression wraps were given. the patient is not diabetic but does have hypertension and her medications have been reviewed by me. 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 The right and left greater saphenous vein was found to be tortuous. Deep venous system was also not competent and there was reflux of more than 500 ms. She was then seen by Dr. Tawanna Cooler Early who recommended that the patient would not benefit from endovenous ablation and he had recommended vein stripping on the right side and multiple small phlebectomy procedures on the left side. the patient did not follow-up due to social economic reasons. She has not been wearing any compression stockings and has not taken any specific treatment for varicose veins for the last 3 years. 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 We have obtained some appointments to see the vascular surgeons by the end of the month and the patient would like to follow up with me at my [facility] on Wednesday, June 29. 10/14/2014 -- she could not see me yesterday in [place] and hence has come for a review today. She has a  vascular workup to be done this afternoon at [facility]. She is doing fine otherwise. 10/22/2014 -- she was seen by Dr. [name] and he has recommended surgical removal of her right saphenous vein from distal thigh to saphenofemoral junction and stab phlebectomy's of multiple large tributary branches throughout her thigh and calf. This would be done under general anesthesia in the outpatient setting. 10/29/2014 -- she is trying to work on a surgical date and in the meanwhile we have got insurance clearance for Apligraf and we will start this next week. 7/22 2016 -- she is here for the first application of Apligraf. 11/19/2014 -- she is here for a second application of Apligraf 11/26/2014 -- she has done fine after her last application of Apligraf and is awaiting her surgery which is scheduled for August 31. 12/03/2014 -- she is doing fine and is here for her third application of Apligraf. 12/21/2014 -- She had surgery on 12/15/2014 by Dr.Early who did #1 ligation and stripping of right great saphenous vein from distal thigh to saphenofemoral junction, #2 stab phlebectomy of large tributary varicose veins in the thigh popliteal space and calf. She had an Ace wrap up to her groin and this was removed today and the Unna's boot was also removed. 12/28/2014 -- she is here for her fourth application of Apligraf. 01/06/2015 - he saw her vascular surgeon Dr. Arbie Cookey who was pleased with her progress and he has confirmed that no surgical procedures could be attempted on the left side. 01/13/2015 -- her wound looks very good and she's been having no problems whatsoever. Readmission: 07/26/2020 upon evaluation today patient presents for initial inspection here in our clinic for a new issue  with her left leg although she is previously been seen due to issues with the right leg back in 2016. At that time she was seeing Dr. Arbie Cookey who is a vein/vascular specialist in [place]. He has since semiretired from  what I understand. He is working out of [employer] I believe. Nonetheless she tells me at the time that there was really nothing to do for her left leg although the right leg was where they did most of the work. Subsequently she states that she is done fairly well until just in the past week where she had issues with bleeding from what appears to be varicose vein on the left leg medially. Unfortunately this has continued to be an issue although she tells me at first it was coming much more significantly Down quite a bit but nonetheless has not completely resolved. Every time she showers she notices that it starts to drain a little bit more. She does have a history of chronic venous insufficiency, lymphedema, varicose veins bilaterally, and obesity. 08/02/2020 upon evaluation today patient appears to be doing about the same in regards to the ulcer on her left leg. She has some eschar covering there is definitely some fluid collecting underneath unfortunately. With that being said she tells me she is still having a tremendous amount of pain therefore she is really not able to allow me to clean this off very effectively to be perfectly honest. I think we need to try to soften this up 08/16/2020 upon evaluation today patient's wound is really not doing significantly better not really states about the same. She notes that the wrap just does not seem to be staying up very well at all unfortunately. No fevers, chills, nausea, vomiting, or diarrhea. She did cut it off once it starts to slide in order to alleviate some of the pressure from sliding Down. Fortunately there is no signs of active infection at this time which is great news. [name], [name] ([id number]) Objective Constitutional Well-nourished and well-hydrated in no acute distress. Vitals Time Taken: 2:10 PM, Height: 66 in, Weight: 388 lbs, BMI: 62.6, Temperature: 98.4 F, Pulse: 81 bpm, Respiratory Rate: 20 breaths/min, Blood Pressure: 118/82  mmHg. Respiratory normal breathing without difficulty. Psychiatric this patient is able to make decisions and demonstrates good insight into disease process. Alert and Oriented x 3. pleasant and cooperative. General Notes: Upon inspection patient's wound bed showed signs of good granulation epithelization at this point in some areas although there is still some slough noted I am a little reluctant to go to hard as far as sharp debridement is concerned as this is over one of the varicose veins I do not want to bleed significantly obviously. With that being said I think possibly switching to Iodoflex would be beneficial. Integumentary (Hair, Skin) Wound #3 status is Open. Original cause of wound was Gradually Appeared. The date acquired was: 07/18/2020. The wound has been in treatment 3 weeks. The wound is located on the Left,Distal,Medial Lower Leg. The wound measures 0.4cm length x 0.5cm width x 0.1cm depth; 0.157cm^2 area and 0.016cm^3 volume. There is Fat Layer (Subcutaneous Tissue) exposed. There is a medium amount of serosanguineous drainage noted. There is small (1-33%) red granulation within the wound bed. There is a large (67-100%) amount of necrotic tissue within the wound bed including Adherent Slough. Assessment Active Problems ICD-10 Venous insufficiency (chronic) (peripheral) Lymphedema, not elsewhere classified Varicose veins of bilateral lower extremities with other complications Non-pressure chronic ulcer of skin of other sites with fat  layer exposed Morbid (severe) obesity due to excess calories Plan Follow-up Appointments: Return Appointment in 1 week. Bathing/ Shower/ Hygiene: Wash wounds with antibacterial soap and water. Edema Control - Lymphedema / Segmental Compressive Device / Other: Elevate, Exercise Daily and Avoid Standing for Long Periods of Time. Elevate legs to the level of the heart and pump ankles as often as possible Elevate leg(s) parallel to the floor  when sitting. WOUND #3: - Lower Leg Wound Laterality: Left, Medial, Distal Cleanser: Soap and Water 1 x Per Week/15 Days Discharge Instructions: Gently cleanse wound with antibacterial soap, rinse and pat dry prior to dressing wounds Primary Dressing: Iodosorb 40 (g) (DME) (Generic) 1 x Per Week/15 Days Discharge Instructions: Apply IodoSorb to wound bed only as directed. Secondary Dressing: Gauze 1 x Per Week/15 Days Discharge Instructions: As directed: dry, moistened with saline or moistened with Dakins Solution Secured With: 50M ACE Elastic Bandage With VELCRO Brand Closure, 4 (in) 1 x Per Week/15 Days ZALEAH, TERNES. ([id number]) 1. I would recommend currently that we go ahead and switch over to the Iodoflex as a treatment of choice I think this may be better. 2. I am also can recommend that we have the patient go ahead and switch over to just using gauze to cover followed by roll gauze to secure and then a Velcro wrap in order to hold everything in place and help some with the edema control. At least until she sees vascular and see what they have to say. 3. She is going to call vein and vascular this afternoon when she leaves here in order to go and get her appointment scheduled. We will see patient back for reevaluation in 1 week here in the clinic. If anything worsens or changes patient will contact our office for additional recommendations. Electronic Signature(s) Signed: 08/16/2020 2:52:56 PM By: Lenda Kelp PA-C Entered By: Lenda Kelp on 08/16/2020 14:52:56 Brinker, [name] ([id number]) -------------------------------------------------------------------------------- SuperBill Details Patient Name: TOVAH, SLAVICK Date of Service: 08/16/2020 Medical Record Number: [id number] Patient Account Number: [id number] Date of Birth/Sex: Apr 25, 1985 (35 y.o. F) Treating RN: Rogers Blocker Primary Care Provider: Lenor Coffin Other Clinician: Lolita Cram Referring Provider:  Lenor Coffin Treating Provider/Extender: Rowan Blase in Treatment: 3 Diagnosis Coding ICD-10 Codes Code Description I87.2 Venous insufficiency (chronic) (peripheral) I89.0 Lymphedema, not elsewhere classified I83.893 Varicose veins of bilateral lower extremities with other complications L98.492 Non-pressure chronic ulcer of skin of other sites with fat layer exposed E66.01 Morbid (severe) obesity due to excess calories Facility Procedures CPT4 Code: 25053976 Description: 99213 - WOUND CARE VISIT-LEV 3 EST PT Modifier: Quantity: 1 Physician Procedures CPT4 Code: 7341937 Description: 99213 - WC PHYS LEVEL 3 - EST PT Modifier: Quantity: 1 CPT4 Code: Description: ICD-10 Diagnosis Description I87.2 Venous insufficiency (chronic) (peripheral) I89.0 Lymphedema, not elsewhere classified I83.893 Varicose veins of bilateral lower extremities with other complications L98.492 Non-pressure chronic ulcer of  skin of other sites with fat layer expos Modifier: ed Quantity: Electronic Signature(s) Signed: 08/16/2020 2:53:09 PM By: Lenda Kelp PA-C Entered By: Lenda Kelp on 08/16/2020 14:53:09

## 2020-08-18 NOTE — Progress Notes (Signed)
[name] ([id number]) Visit Report for 08/16/2020 Arrival Information Details Patient Name: [name], [name]. Date of Service: 08/16/2020 2:00 PM Medical Record Number: [id number] Patient Account Number: [id number] Date of Birth/Sex: [birth year] (35 y.o. F) Treating RN: [name] Primary Care [name]: [name] Other Clinician: Lolita [name] Referring [name]: [name] Treating [name]/Extender: [name] in Treatment: 3 Visit Information History Since Last Visit All ordered tests and consults were completed: No Patient Arrived: Ambulatory Added or deleted any medications: No Arrival Time: 14:07 Any new allergies or adverse reactions: No Accompanied By: self Had a fall or experienced change in No Transfer Assistance: None activities of daily living that may affect Patient Identification Verified: Yes risk of falls: Secondary Verification Process Completed: Yes Signs or symptoms of abuse/neglect since last visito No Patient Requires Transmission-Based Precautions: No Hospitalized since last visit: No Patient Has Alerts: No Implantable device outside of the clinic excluding No cellular tissue based products placed in the center since last visit: Has Dressing in Place as Prescribed: No Has Compression in Place as Prescribed: No Pain Present Now: No Electronic Signature(s) Signed: 08/16/2020 4:52:48 PM By: [name] Pax RN Entered By: [name] on 08/16/2020 14:10:57 Witting, [name] ([id number]) -------------------------------------------------------------------------------- Clinic Level of Care Assessment Details Patient Name: [name], [name] Date of Service: 08/16/2020 2:00 PM Medical Record Number: [id number] Patient Account Number: [id number] Date of Birth/Sex: Jun 06, 1985 (35 y.o. F) Treating RN: [name] Primary Care [name]: [name] Other Clinician: Lolita [name] Referring [name]: [name] Treating Navpreet Szczygiel/Extender:  [name] in Treatment: 3 Clinic Level of Care Assessment Items TOOL 4 Quantity Score []  - Use when only an EandM is performed on FOLLOW-UP visit 0 ASSESSMENTS - Nursing Assessment / Reassessment X - Reassessment of Co-morbidities (includes updates in patient status) 1 10 X- 1 5 Reassessment of Adherence to Treatment Plan ASSESSMENTS - Wound and Skin Assessment / Reassessment X - Simple Wound Assessment / Reassessment - one wound 1 5 []  - 0 Complex Wound Assessment / Reassessment - multiple wounds []  - 0 Dermatologic / Skin Assessment (not related to wound area) ASSESSMENTS - Focused Assessment []  - Circumferential Edema Measurements - multi extremities 0 []  - 0 Nutritional Assessment / Counseling / Intervention []  - 0 Lower Extremity Assessment (monofilament, tuning fork, pulses) []  - 0 Peripheral Arterial Disease Assessment (using hand held doppler) ASSESSMENTS - Ostomy and/or Continence Assessment and Care []  - Incontinence Assessment and Management 0 []  - 0 Ostomy Care Assessment and Management (repouching, etc.) PROCESS - Coordination of Care X - Simple Patient / Family Education for ongoing care 1 15 []  - 0 Complex (extensive) Patient / Family Education for ongoing care []  - 0 Staff obtains , Records, Test Results / Process Orders []  - 0 Staff telephones HHA, Nursing Homes / Clarify orders / etc []  - 0 Routine Transfer to another Facility (non-emergent condition) []  - 0 Routine Hospital Admission (non-emergent condition) []  - 0 New Admissions / / Ordering NPWT, Apligraf, etc. []  - 0 Emergency Hospital Admission (emergent condition) X- 1 10 Simple Discharge Coordination []  - 0 Complex (extensive) Discharge Coordination PROCESS - Special Needs []  - Pediatric / Minor Patient Management 0 []  - 0 Isolation Patient Management []  - 0 Hearing / Language / Visual special needs []  - 0 Assessment of Community assistance  (transportation, D/C planning, etc.) []  - 0 Additional assistance / Altered mentation []  - 0 Support Surface(s) Assessment (bed, cushion, seat, etc.) INTERVENTIONS - Wound Cleansing / Measurement Bernardi,  [name]. ([id number]) X- 1 5 Simple Wound Cleansing - one wound []  - 0 Complex Wound Cleansing - multiple wounds X- 1 5 Wound Imaging (photographs - any number of wounds) []  - 0 Wound Tracing (instead of photographs) X- 1 5 Simple Wound Measurement - one wound []  - 0 Complex Wound Measurement - multiple wounds INTERVENTIONS - Wound Dressings []  - Small Wound Dressing one or multiple wounds 0 []  - 0 Medium Wound Dressing one or multiple wounds X- 1 20 Large Wound Dressing one or multiple wounds []  - 0 Application of Medications - topical []  - 0 Application of Medications - injection INTERVENTIONS - Miscellaneous []  - External ear exam 0 []  - 0 Specimen Collection (cultures, biopsies, blood, body fluids, etc.) []  - 0 Specimen(s) / Culture(s) sent or taken to Lab for analysis []  - 0 Patient Transfer (multiple staff / / Similar devices) []  - 0 Simple Staple / Suture removal (25 or less) []  - 0 Complex Staple / Suture removal (26 or more) []  - 0 Hypo / Hyperglycemic Management (close monitor of Blood Glucose) []  - 0 Ankle / Brachial Index (ABI) - do not check if billed separately X- 1 5 Vital Signs Has the patient been seen at the hospital within the last three years: Yes Total Score: 85 Level Of Care: New/Established - Level 3 Electronic Signature(s) Signed: 08/17/2020 6:13:14 PM By: , [name], RN, CWS, [name], [name] Entered By: , [name], RN, CWS, [name] on 08/16/2020 14:52:09 Louderback, ( ) -------------------------------------------------------------------------------- Encounter Discharge Information Details Patient Name: [name], [name]. Date of Service: 08/16/2020 2:00 PM Medical Record Number: Patient Account Number:  Date of Birth/Sex: 1985/05/05 (35 y.o. F) Treating RN: Primary Care Delta Deshmukh: Other Clinician: Referring Danyl Deems: 10/17/2020 Treating Rainbow Salman/Extender: Elliot Gurney in Treatment: 3 Encounter Discharge Information Items Discharge Condition: Stable Ambulatory Status: Ambulatory Discharge Destination: Home Transportation: Private Auto Accompanied By: self Schedule Follow-up Appointment: Yes Clinical Summary of Care: Electronic Signature(s) Signed: 08/16/2020 5:01:09 PM By: 10/16/2020 Entered By: [name] on 08/16/2020 15:03:55 Pillay, [name] Simmonds (10/16/2020) -------------------------------------------------------------------------------- Lower Extremity Assessment Details Patient Name: [name], [name] Date of Service: 08/16/2020 2:00 PM Medical Record Number: [birth year]/1986 Patient Account Number: 20 Date of Birth/Sex: October 13, 1985 (35 y.o. F) Treating RN: [name] Primary Care Monque Haggar: [name] Other Clinician: Rowan [name] Referring Frankey Botting: 10/16/2020 Treating Terrisa Curfman/Extender: [name] Weeks in Treatment: 3 Edema Assessment Assessed: [Left: No] [Right: No] Edema: [Left: Ye] [Right: s] Calf Left: Right: Point of Measurement: 31 cm From Medial Instep 59 cm Ankle Left: Right: Point of Measurement: 9 cm From Medial Instep 32 cm Vascular Assessment Pulses: Dorsalis Pedis Palpable: [Left:Yes] Electronic Signature(s) Signed: 08/16/2020 4:52:48 PM By: 10/16/2020 RN Entered By: [name] on 08/16/2020 14:18:46 Poon, [name] Simmonds (10/16/2020) -------------------------------------------------------------------------------- Multi Wound Chart Details Patient Name: [name], [name] Date of Service: 08/16/2020 2:00 PM Medical Record Number: 03/23/1986 Patient Account Number: 20 Date of Birth/Sex: Jan 25, 1986 (35 y.o. F) Treating RN: [name] Primary Care Django Nguyen: [name] Other Clinician: Allen Derry Referring Amia Rynders: 10/16/2020 Treating Kloi Brodman/Extender: [name] in Treatment: 3 Vital Signs Height(in): 66 Pulse(bpm): 81 Weight(lbs): 388 Blood Pressure(mmHg): 118/82 Body Mass Index(BMI): 63 Temperature(F): 98.4 Respiratory Rate(breaths/min): 20 Photos: [N/A:N/A] Wound Location: Left, Distal, Medial Lower Leg N/A N/A Wounding Event: Gradually Appeared N/A N/A Primary Etiology: Venous Leg Ulcer N/A N/A Secondary Etiology: Lymphedema N/A N/A Comorbid History: Hypertension, Peripheral Venous N/A N/A Disease  Date Acquired: 07/18/2020 N/A N/A Weeks of Treatment: 3 N/A N/A Wound Status: Open N/A N/A Measurements L x W x D (cm) 0.4x0.5x0.1 N/A N/A Area (cm) : 0.157 N/A N/A Volume (cm) : 0.016 N/A N/A % Reduction in Area: -406.50% N/A N/A % Reduction in Volume: -433.30% N/A N/A Classification: Full Thickness Without Exposed N/A N/A Support Structures Exudate Amount: Medium N/A N/A Exudate Type: Serosanguineous N/A N/A Exudate Color: red, brown N/A N/A Granulation Amount: Small (1-33%) N/A N/A Granulation Quality: Red N/A N/A Necrotic Amount: Large (67-100%) N/A N/A Exposed Structures: Fat Layer (Subcutaneous Tissue): N/A N/A Yes Fascia: No Tendon: No Muscle: No Joint: No Bone: No Epithelialization: None N/A N/A Treatment Notes Electronic Signature(s) Signed: 08/17/2020 6:13:14 PM By: Elliot Gurney, [name], RN, CWS, [name], [name] Entered By: Elliot Gurney, [name], RN, CWS, [name] on 08/16/2020 14:46:10 Zarazua, [name] ([id number]) -------------------------------------------------------------------------------- Multi-Disciplinary Care Plan Details Patient Name: [name], HUMM. Date of Service: 08/16/2020 2:00 PM Medical Record Number: [id number] Patient Account Number: [id number] Date of Birth/Sex: Sep 15, 1985 (35 y.o. F) Treating RN: [name] Primary Care Joniyah Mallinger: [name] Other Clinician: Lolita [name] Referring Cayci Mcnabb:  [name] Treating Lesslie Mckeehan/Extender: [name] in Treatment: 3 Active Inactive Wound/Skin Impairment Nursing Diagnoses: Impaired tissue integrity Goals: Patient/caregiver will verbalize understanding of skin care regimen Date Initiated: 07/26/2020 Target Resolution Date: 07/26/2020 Goal Status: Active Ulcer/skin breakdown will have a volume reduction of 30% by week 4 Date Initiated: 07/26/2020 Target Resolution Date: 08/25/2020 Goal Status: Active Ulcer/skin breakdown will have a volume reduction of 50% by week 8 Date Initiated: 07/26/2020 Target Resolution Date: 09/25/2020 Goal Status: Active Ulcer/skin breakdown will have a volume reduction of 80% by week 12 Date Initiated: 07/26/2020 Target Resolution Date: 10/25/2020 Goal Status: Active Ulcer/skin breakdown will heal within 14 weeks Date Initiated: 07/26/2020 Target Resolution Date: 11/25/2020 Goal Status: Active Interventions: Assess patient/caregiver ability to obtain necessary supplies Assess patient/caregiver ability to perform ulcer/skin care regimen upon admission and as needed Assess ulceration(s) every visit Provide education on ulcer and skin care Treatment Activities: Referred to DME Anas Reister for dressing supplies : 07/26/2020 Skin care regimen initiated : 07/26/2020 Notes: Electronic Signature(s) Signed: 08/17/2020 6:13:14 PM By: Elliot Gurney, [name], RN, CWS, [name], [name] Entered By: Elliot Gurney, [name], RN, CWS, [name] on 08/16/2020 14:46:00 Pearse, [name] ([id number]) -------------------------------------------------------------------------------- Pain Assessment Details Patient Name: [name], [name]. Date of Service: 08/16/2020 2:00 PM Medical Record Number: [id number] Patient Account Number: [id number] Date of Birth/Sex: October 02, 1985 (35 y.o. F) Treating RN: [name] Primary Care Abisola Carrero: [name] Other Clinician: Lolita [name] Referring Vimal Derego: [name] Treating Laron Angelini/Extender: [name] in  Treatment: 3 Active Problems Location of Pain Severity and Description of Pain Patient Has Paino No Site Locations Pain Management and Medication Current Pain Management: Electronic Signature(s) Signed: 08/16/2020 4:52:48 PM By: [name] Pax RN Entered By: [name] on 08/16/2020 14:11:38 Lipson, [name] ([id number]) -------------------------------------------------------------------------------- Patient/Caregiver Education Details Patient Name: [name], [name] Date of Service: 08/16/2020 2:00 PM Medical Record Number: [id number] Patient Account Number: [id number] Date of Birth/Gender: 26-Oct-1985 (35 y.o. F) Treating RN: [name] Primary Care Physician: [name] Other Clinician: Lolita [name] Referring Physician: Lenor [name] Treating Physician/Extender: [name] in Treatment: 3 Education Assessment Education Provided To: Patient Education Topics Provided Venous: Handouts: Other: Ace Wraps Methods: Demonstration, Explain/Verbal Responses: State content correctly Wound/Skin Impairment: Handouts: Skin Care Do's and Dont's Methods: Demonstration, Explain/Verbal Responses: State content correctly Electronic Signature(s) Signed: 08/17/2020 6:13:14 PM By: Elliot Gurney, [name], RN, CWS, [name], [name] Entered By:  Elliot GurneyWoody, [name], RN, CWS, [name] on 08/16/2020 14:53:48 Releford, [name] PatterSHAMA [name]. ([id number]) -------------------------------------------------------------------------------- Wound Assessment Details Patient Name: [name], [name]. Date of Service: 08/16/2020 2:00 PM Medical Record Number: [id number] Patient Account Number: [id number] Date of Birth/Sex: 12/30/1985 (35 y.o. F) Treating RN: [name] PaxEpps, [name] Primary Care Brihany Butch: [name] Coffinlson, Daniel Other Clinician: Lolita CramBurnette, Kyara Referring Jakira Mcfadden: [name] Coffinlson, Daniel Treating Keisa Blow/Extender: [name] BlaseStone, Hoyt Weeks in Treatment: 3 Wound Status Wound Number: 3 Primary Etiology: Venous Leg Ulcer Wound Location: Left, Distal, Medial Lower  Leg Secondary Etiology: Lymphedema Wounding Event: Gradually Appeared Wound Status: Open Date Acquired: 07/18/2020 Comorbid History: Hypertension, Peripheral Venous Disease Weeks Of Treatment: 3 Clustered Wound: No Photos Wound Measurements Length: (cm) 0.4 Width: (cm) 0.5 Depth: (cm) 0.1 Area: (cm) 0.157 Volume: (cm) 0.016 % Reduction in Area: -406.5% % Reduction in Volume: -433.3% Epithelialization: None Wound Description Classification: Full Thickness Without Exposed Support Structures Exudate Amount: Medium Exudate Type: Serosanguineous Exudate Color: red, brown Foul Odor After Cleansing: No Slough/Fibrino Yes Wound Bed Granulation Amount: Small (1-33%) Exposed Structure Granulation Quality: Red Fascia Exposed: No Necrotic Amount: Large (67-100%) Fat Layer (Subcutaneous Tissue) Exposed: Yes Necrotic Quality: Adherent Slough Tendon Exposed: No Muscle Exposed: No Joint Exposed: No Bone Exposed: No Treatment Notes Wound #3 (Lower Leg) Wound Laterality: Left, Medial, Distal Cleanser Soap and Water Discharge Instruction: Gently cleanse wound with antibacterial soap, rinse and pat dry prior to dressing wounds Peri-Wound Care [name], [name]. ([id number]) Topical Primary Dressing Iodosorb 40 (g) Discharge Instruction: Apply IodoSorb to wound bed only as directed. Secondary Dressing ABD Pad 5x9 (in/in) Discharge Instruction: Cover with ABD pad Secured With 30M ACE Elastic Bandage With VELCRO Brand Closure, 4 (in) Compression Wrap Compression Stockings Add-Ons Electronic Signature(s) Signed: 08/16/2020 4:52:48 PM By: [name] PaxEpps, [name] RN Entered By: [name] PaxEpps, [name] on 08/16/2020 14:17:57 Moris, [name] PatterSHAMA [name]. ([id number]) -------------------------------------------------------------------------------- Vitals Details Patient Name: [name], Willowdean [name]. Date of Service: 08/16/2020 2:00 PM Medical Record Number: [id number] Patient Account Number: [id number] Date of Birth/Sex:  09/13/1985 (35 y.o. F) Treating RN: [name] PaxEpps, [name] Primary Care Darryle Dennie: [name] Coffinlson, Daniel Other Clinician: Lolita CramBurnette, Kyara Referring Jerrianne Hartin: [name] Coffinlson, Daniel Treating Breklyn Fabrizio/Extender: [name] BlaseStone, Hoyt Weeks in Treatment: 3 Vital Signs Time Taken: 14:10 Temperature (F): 98.4 Height (in): 66 Pulse (bpm): 81 Weight (lbs): 388 Respiratory Rate (breaths/min): 20 Body Mass Index (BMI): 62.6 Blood Pressure (mmHg): 118/82 Reference Range: 80 - 120 mg / dl Electronic Signature(s) Signed: 08/16/2020 4:52:48 PM By: [name] PaxEpps, [name] RN Entered By: [name] PaxEpps, [name] on 08/16/2020 14:11:32

## 2020-08-23 ENCOUNTER — Encounter: Payer: BC Managed Care – PPO | Admitting: Physician Assistant

## 2020-08-23 ENCOUNTER — Other Ambulatory Visit: Payer: Self-pay

## 2020-08-23 DIAGNOSIS — I872 Venous insufficiency (chronic) (peripheral): Secondary | ICD-10-CM | POA: Diagnosis not present

## 2020-08-23 DIAGNOSIS — I83893 Varicose veins of bilateral lower extremities with other complications: Secondary | ICD-10-CM

## 2020-08-23 NOTE — Progress Notes (Addendum)
SWAN, FAIRFAX ([id number]) Visit Report for 08/23/2020 Chief Complaint Document Details Patient Name: STEFFIE, WAGGONER. Date of Service: 08/23/2020 2:30 PM Medical Record Number: [id number] Patient Account Number: [id number] Date of Birth/Sex: February 19, 1986 (35 y.o. F) Treating RN: Rogers Blocker Primary Care Provider: Lenor Coffin Other Clinician: Referring Provider: Lenor Coffin Treating Provider/Extender: Rowan Blase in Treatment: 4 Information Obtained from: Patient Chief Complaint Left LE Ulcer Electronic Signature(s) Signed: 08/23/2020 3:03:55 PM By: Lenda Kelp PA-C Entered By: Lenda Kelp on 08/23/2020 15:03:55 Buchner, [name] ([id number]) -------------------------------------------------------------------------------- HPI Details Patient Name: ZAYDA, ANGELL Date of Service: 08/23/2020 2:30 PM Medical Record Number: [id number] Patient Account Number: [id number] Date of Birth/Sex: [birth year] (35 y.o. F) Treating RN: Rogers Blocker Primary Care Provider: Lenor Coffin Other Clinician: Referring Provider: Lenor Coffin Treating Provider/Extender: Rowan Blase in Treatment: 4 History of Present Illness HPI Description: 35 year old patient who started with having ulcerations on the right lower leg on the lateral part of her ankle for about 2 weeks. She was seen in the ER at [facility] and advised to see the wound care for a consultation. No X-rays of workup was done during the ER visit and no prescription for any medications of compression wraps were given. the patient is not diabetic but does have hypertension and her medications have been reviewed by me. 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 The right and left greater saphenous vein was found to be tortuous. Deep venous system was also  not competent and there was reflux of more than 500 ms. She was then seen by Dr. Tawanna Cooler Early who recommended that the patient would not benefit from endovenous ablation and he had recommended vein stripping on the right side and multiple small phlebectomy procedures on the left side. the patient did not follow-up due to social economic reasons. She has not been wearing any compression stockings and has not taken any specific treatment for varicose veins for the last 3 years. 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 We have obtained some appointments to see the vascular surgeons by the end of the month and the patient would like to follow up with me at my [facility] on Wednesday, June 29. 10/14/2014 -- she could not see me yesterday in [place] and hence has come for a review today. She has a vascular workup to be done this afternoon at [facility]. She is doing fine otherwise. 10/22/2014 -- she was seen by Dr. [name] and he has recommended surgical removal of her right saphenous vein from distal thigh to saphenofemoral junction and stab phlebectomy's of multiple large tributary branches throughout her thigh and calf. This would be done under general anesthesia in the outpatient setting. 10/29/2014 -- she is trying to work on a surgical date and in the meanwhile we have got insurance clearance for Apligraf and we will start this next week. 7/22 2016 -- she is here for the first application of Apligraf. 11/19/2014 -- she is here for a second application of Apligraf 11/26/2014 -- she has done fine after her last application of Apligraf and is awaiting her surgery which is scheduled for August 31. 12/03/2014 -- she is doing fine and is here for her third application  of Apligraf. 12/21/2014 -- She had surgery on 12/15/2014 by Dr.Early who did #1 ligation and stripping of right great  saphenous vein from distal thigh to saphenofemoral junction, #2 stab phlebectomy of large tributary varicose veins in the thigh popliteal space and calf. She had an Ace wrap up to her groin and this was removed today and the Unna's boot was also removed. 12/28/2014 -- she is here for her fourth application of Apligraf. 01/06/2015 - he saw her vascular surgeon Dr. Arbie CookeyEarly who was pleased with her progress and he has confirmed that no surgical procedures could be attempted on the left side. 01/13/2015 -- her wound looks very good and she's been having no problems whatsoever. Readmission: 07/26/2020 upon evaluation today patient presents for initial inspection here in our clinic for a new issue with her left leg although she is previously been seen due to issues with the right leg back in 2016. At that time she was seeing Dr. Arbie CookeyEarly who is a vein/vascular specialist in [place]. He has since semiretired from what I understand. He is working out of [place] I believe. Nonetheless she tells me at the time that there was really nothing to do for her left leg although the right leg was where they did most of the work. Subsequently she states that she is done fairly well until just in the past week where she had issues with bleeding from what appears to be varicose vein on the left leg medially. Unfortunately this has continued to be an issue although she tells me at first it was coming much more significantly Down quite a bit but nonetheless has not completely resolved. Every time she showers she notices that it starts to drain a little bit more. She does have a history of chronic venous insufficiency, lymphedema, varicose veins bilaterally, and obesity. 08/02/2020 upon evaluation today patient appears to be doing about the same in regards to the ulcer on her left leg. She has some eschar covering there is definitely some fluid collecting underneath unfortunately. With that being said she tells me she is  still having a tremendous amount of pain therefore she is really not able to allow me to clean this off very effectively to be perfectly honest. I think we need to try to soften this up 08/16/2020 upon evaluation today patient's wound is really not doing significantly better not really states about the same. She notes that the wrap just does not seem to be staying up very well at all unfortunately. No fevers, chills, nausea, vomiting, or diarrhea. She did cut it off once it starts to slide in order to alleviate some of the pressure from sliding Down. Fortunately there is no signs of active infection at this time which is great news. 08/23/2020 upon evaluation today patient appears to be doing well 08/23/2020 upon evaluation today patient appears to be doing well with regard to her wound all things considered. Fortunately there does not appear to be any signs of active infection at this time which is great news. She has been tolerating the dressing changes without complication and overall I am extremely pleased with where things stand at this point. She does [place] [facility]. Conley SimmondsSUMMERS, Beaulah J. ([id number]) Electronic Signature(s) Signed: 08/23/2020 3:36:12 PM By: Lenda KelpStone III, Ornella Coderre PA-C Entered By: Lenda KelpStone III, Belisa Eichholz on 08/23/2020 15:36:12 Christensen, [name] ([id number]) -------------------------------------------------------------------------------- Physical Exam Details Patient Name: Conley SimmondsSUMMERS, Taneah J. Date of Service: 08/23/2020 2:30 PM Medical Record Number: [id number] Patient Account  Number: [id number] Date of Birth/Sex: June 25, 1985 (34 y.o. F) Treating RN: Rogers Blocker Primary Care Provider: Lenor Coffin Other Clinician: Referring Provider: Lenor Coffin Treating Provider/Extender: Allen Derry Weeks in Treatment: 4 Constitutional Well-nourished and well-hydrated in no acute distress. Respiratory normal breathing without difficulty. Psychiatric this  patient is able to make decisions and demonstrates good insight into disease process. Alert and Oriented x 3. pleasant and cooperative. Notes Upon inspection patient's wound bed showed signs of good granulation epithelization at this point. There does not appear to be any evidence of active infection which is great news and overall I am extremely pleased with where things stand. Electronic Signature(s) Signed: 08/23/2020 3:36:24 PM By: Lenda Kelp PA-C Entered By: Lenda Kelp on 08/23/2020 15:36:24 Pfannenstiel, [name] ([id number]) -------------------------------------------------------------------------------- Physician Orders Details Patient Name: HANNAHGRACE, LALLI Date of Service: 08/23/2020 2:30 PM Medical Record Number: [id number] Patient Account Number: [id number] Date of Birth/Sex: 1986/02/05 (35 y.o. F) Treating RN: Rogers Blocker Primary Care Provider: Lenor Coffin Other Clinician: Referring Provider: Lenor Coffin Treating Provider/Extender: Rowan Blase in Treatment: 4 Verbal / Phone Orders: No Diagnosis Coding ICD-10 Coding Code Description I87.2 Venous insufficiency (chronic) (peripheral) I89.0 Lymphedema, not elsewhere classified I83.893 Varicose veins of bilateral lower extremities with other complications L98.492 Non-pressure chronic ulcer of skin of other sites with fat layer exposed E66.01 Morbid (severe) obesity due to excess calories Follow-up Appointments o Return Appointment in 1 week. Bathing/ Shower/ Hygiene o Wash wounds with antibacterial soap and water. Edema Control - Lymphedema / Segmental Compressive Device / Other Left Lower Extremity o Elevate, Exercise Daily and Avoid Standing for Long Periods of Time. o Elevate legs to the level of the heart and pump ankles as often as possible o Elevate leg(s) parallel to the floor when sitting. Wound Treatment Wound #3 - Lower Leg Wound Laterality: Left, Medial, Distal Cleanser: Soap and Water  3 x Per Week/15 Days Discharge Instructions: Gently cleanse wound with antibacterial soap, rinse and pat dry prior to dressing wounds Primary Dressing: Iodosorb 40 (g) (DME) (Generic) 3 x Per Week/15 Days Discharge Instructions: Apply IodoSorb to wound bed only as directed. Secondary Dressing: ABD Pad 5x9 (in/in) (DME) (Generic) 3 x Per Week/15 Days Discharge Instructions: Cover with ABD pad Secured With: 7M ACE Elastic Bandage With VELCRO Brand Closure, 4 (in) (DME) (Generic) 3 x Per Week/15 Days Discharge Instructions: Wrap from ankle to calf-patient to adjust if ACE wrap falls Electronic Signature(s) Signed: 08/23/2020 3:56:49 PM By: Phillis Haggis, Dondra Prader RN Signed: 08/23/2020 5:02:57 PM By: Lenda Kelp PA-C Entered By: Phillis Haggis, Dondra Prader on 08/23/2020 15:34:50 Calma, [name] ([id number]) -------------------------------------------------------------------------------- Problem List Details Patient Name: ALBENA, COMES. Date of Service: 08/23/2020 2:30 PM Medical Record Number: [id number] Patient Account Number: [id number] Date of Birth/Sex: June 04, 1985 (36 y.o. F) Treating RN: Rogers Blocker Primary Care Provider: Lenor Coffin Other Clinician: Referring Provider: Lenor Coffin Treating Provider/Extender: Rowan Blase in Treatment: 4 Active Problems ICD-10 Encounter Code Description Active Date MDM Diagnosis I87.2 Venous insufficiency (chronic) (peripheral) 07/26/2020 No Yes I89.0 Lymphedema, not elsewhere classified 07/26/2020 No Yes I83.893 Varicose veins of bilateral lower extremities with other complications 07/26/2020 No Yes L98.492 Non-pressure chronic ulcer of skin of other sites with fat layer exposed 07/26/2020 No Yes E66.01 Morbid (severe) obesity due to excess calories 07/26/2020 No Yes Inactive Problems Resolved Problems Electronic Signature(s) Signed: 08/23/2020 3:03:50 PM By: Lenda Kelp PA-C Entered By: Lenda Kelp on 08/23/2020 15:03:49 Taves,  [name] ([id number]) -------------------------------------------------------------------------------- Progress Note Details  Patient Name: SARINAH, DOETSCH. Date of Service: 08/23/2020 2:30 PM Medical Record Number: [id number] Patient Account Number: [id number] Date of Birth/Sex: 1986/03/21 (35 y.o. F) Treating RN: Rogers Blocker Primary Care Provider: Lenor Coffin Other Clinician: Referring Provider: Lenor Coffin Treating Provider/Extender: Rowan Blase in Treatment: 4 Subjective Chief Complaint Information obtained from Patient Left LE Ulcer History of Present Illness (HPI) 35 year old patient who started with having ulcerations on the right lower leg on the lateral part of her ankle for about 2 weeks. She was seen in the ER at [facility] and advised to see the wound care for a consultation. No X-rays of workup was done during the ER visit and no prescription for any medications of compression wraps were given. the patient is not diabetic but does have hypertension and her medications have been reviewed by me. 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 The right and left greater saphenous vein was found to be tortuous. Deep venous system was also not competent and there was reflux of more than 500 ms. She was then seen by Dr. Tawanna Cooler Early who recommended that the patient would not benefit from endovenous ablation and he had recommended vein stripping on the right side and multiple small phlebectomy procedures on the left side. the patient did not follow-up due to social economic reasons. She has not been wearing any compression stockings and has not taken any specific treatment for varicose veins for the last 3 years. 09/27/2014 -- She has developed a new wound on the medial malleolus which is rather superficial and in  the area where she has stasis dermatitis. We have obtained some appointments to see the vascular surgeons by the end of the month and the patient would like to follow up with me at my [facility] on Wednesday, June 29. 10/14/2014 -- she could not see me yesterday in [place] and hence has come for a review today. She has a vascular workup to be done this afternoon at [facility] She is doing fine otherwise. 10/22/2014 -- she was seen by Dr. [name] and he has recommended surgical removal of her right saphenous vein from distal thigh to saphenofemoral junction and stab phlebectomy's of multiple large tributary branches throughout her thigh and calf. This would be done under general anesthesia in the outpatient setting. 10/29/2014 -- she is trying to work on a surgical date and in the meanwhile we have got insurance clearance for Apligraf and we will start this next week. 7/22 2016 -- she is here for the first application of Apligraf. 11/19/2014 -- she is here for a second application of Apligraf 11/26/2014 -- she has done fine after her last application of Apligraf and is awaiting her surgery which is scheduled for August 31. 12/03/2014 -- she is doing fine and is here for her third application of Apligraf. 12/21/2014 -- She had surgery on 12/15/2014 by Dr.Early who did #1 ligation and stripping of right great saphenous vein from distal thigh to saphenofemoral junction, #2 stab phlebectomy of large tributary varicose veins in the thigh popliteal space and calf. She had an Ace wrap up to her groin and this was removed today and the Unna's boot was also removed. 12/28/2014 -- she is here for her fourth application of Apligraf. 01/06/2015 - he saw her vascular surgeon Dr. Arbie Cookey who was pleased  with her progress and he has confirmed that no surgical procedures could be attempted on the left side. 01/13/2015 -- her wound looks very good and she's been having no problems  whatsoever. Readmission: 07/26/2020 upon evaluation today patient presents for initial inspection here in our clinic for a new issue with her left leg although she is previously been seen due to issues with the right leg back in 2016. At that time she was seeing Dr. Arbie Cookey who is a vein/vascular specialist in [place]. He has since semiretired from what I understand. He is working out of [employer] I believe. Nonetheless she tells me at the time that there was really nothing to do for her left leg although the right leg was where they did most of the work. Subsequently she states that she is done fairly well until just in the past week where she had issues with bleeding from what appears to be varicose vein on the left leg medially. Unfortunately this has continued to be an issue although she tells me at first it was coming much more significantly Down quite a bit but nonetheless has not completely resolved. Every time she showers she notices that it starts to drain a little bit more. She does have a history of chronic venous insufficiency, lymphedema, varicose veins bilaterally, and obesity. 08/02/2020 upon evaluation today patient appears to be doing about the same in regards to the ulcer on her left leg. She has some eschar covering there is definitely some fluid collecting underneath unfortunately. With that being said she tells me she is still having a tremendous amount of pain therefore she is really not able to allow me to clean this off very effectively to be perfectly honest. I think we need to try to soften this up 08/16/2020 upon evaluation today patient's wound is really not doing significantly better not really states about the same. She notes that the wrap just does not seem to be staying up very well at all unfortunately. No fevers, chills, nausea, vomiting, or diarrhea. She did cut it off once it starts to slide in order to alleviate some of the pressure from sliding Down. Fortunately  there is no signs of active infection at this time which is great news. DENETRIA, LUEVANOS ([id number]) 08/23/2020 upon evaluation today patient appears to be doing well 08/23/2020 upon evaluation today patient appears to be doing well with regard to her wound all things considered. Fortunately there does not appear to be any signs of active infection at this time which is great news. She has been tolerating the dressing changes without complication and overall I am extremely pleased with where things stand at this point. She does have her appointment with vascular in [facility] on June 9. Objective Constitutional Well-nourished and well-hydrated in no acute distress. Vitals Time Taken: 2:47 PM, Height: 66 in, Weight: 388 lbs, BMI: 62.6, Temperature: 98.5 F, Pulse: 99 bpm, Respiratory Rate: 16 breaths/min, Blood Pressure: 111/83 mmHg. Respiratory normal breathing without difficulty. Psychiatric this patient is able to make decisions and demonstrates good insight into disease process. Alert and Oriented x 3. pleasant and cooperative. General Notes: Upon inspection patient's wound bed showed signs of good granulation epithelization at this point. There does not appear to be any evidence of active infection which is great news and overall I am extremely pleased with where things stand. Integumentary (Hair, Skin) Wound #3 status is Open. Original cause of wound was Gradually Appeared. The date acquired was: 07/18/2020. The wound has been in treatment  4 weeks. The wound is located on the Left,Distal,Medial Lower Leg. The wound measures 0.4cm length x 0.4cm width x 0.1cm depth; 0.126cm^2 area and 0.013cm^3 volume. There is Fat Layer (Subcutaneous Tissue) exposed. There is no tunneling or undermining noted. There is a medium amount of serosanguineous drainage noted. There is small (1-33%) red granulation within the wound bed. There is a large (67-100%) amount of necrotic tissue within the wound bed  including Adherent Slough. Assessment Active Problems ICD-10 Venous insufficiency (chronic) (peripheral) Lymphedema, not elsewhere classified Varicose veins of bilateral lower extremities with other complications Non-pressure chronic ulcer of skin of other sites with fat layer exposed Morbid (severe) obesity due to excess calories Plan Follow-up Appointments: Return Appointment in 1 week. Bathing/ Shower/ Hygiene: Wash wounds with antibacterial soap and water. Edema Control - Lymphedema / Segmental Compressive Device / Other: Elevate, Exercise Daily and Avoid Standing for Long Periods of Time. Elevate legs to the level of the heart and pump ankles as often as possible Elevate leg(s) parallel to the floor when sitting. WOUND #3: - Lower Leg Wound Laterality: Left, Medial, Distal Cleanser: Soap and Water 3 x Per Week/15 Days Discharge Instructions: Gently cleanse wound with antibacterial soap, rinse and pat dry prior to dressing wounds Primary Dressing: Iodosorb 40 (g) (DME) (Generic) 3 x Per Week/15 Days Discharge Instructions: Apply IodoSorb to wound bed only as directed. Secondary Dressing: ABD Pad 5x9 (in/in) (DME) (Generic) 3 x Per Week/15 Days Discharge Instructions: Cover with ABD pad MALISHA, MABEY ([id number]) Secured With: 20M ACE Elastic Bandage With VELCRO Brand Closure, 4 (in) (DME) (Generic) 3 x Per Week/15 Days Discharge Instructions: Wrap from ankle to calf-patient to adjust if ACE wrap falls 1. Would recommend currently that we go ahead and continue with the wound care measures as before. The patient is in agreement with the plan. This includes the use of the Iodosorb dressing which I think has been beneficial. 2. I am also going to recommend that we have the patient continue with ABD pad followed by roll gauze to secure and then subsequently an Ace wrap. I think she should change this 3 times a week also think it would be good for her to be able to wash and bathe that  is shower to clean this area before reapplying the dressing. We will see patient back for reevaluation in 1 week here in the clinic. If anything worsens or changes patient will contact our office for additional recommendations. Electronic Signature(s) Signed: 08/23/2020 3:37:17 PM By: Lenda Kelp PA-C Entered By: Lenda Kelp on 08/23/2020 15:37:17 Aust, [name] -------------------------------------------------------------------------------- SuperBill Details Patient Name: WESLIE, RASMUS Date of Service: 08/23/2020 Medical Record Number: [id number] Patient Account Number: [id number] Date of Birth/Sex: 1985-12-23 (35 y.o. F) Treating RN: Rogers Blocker Primary Care Provider: Lenor Coffin Other Clinician: Referring Provider: Lenor Coffin Treating Provider/Extender: Rowan Blase in Treatment: 4 Diagnosis Coding ICD-10 Codes Code Description I87.2 Venous insufficiency (chronic) (peripheral) I89.0 Lymphedema, not elsewhere classified I83.893 Varicose veins of bilateral lower extremities with other complications L98.492 Non-pressure chronic ulcer of skin of other sites with fat layer exposed E66.01 Morbid (severe) obesity due to excess calories Facility Procedures CPT4 Code: 08657846 Description: 99213 - WOUND CARE VISIT-LEV 3 EST PT Modifier: Quantity: 1 Physician Procedures CPT4 Code: [id number] Description: 99213 - WC PHYS LEVEL 3 - EST PT Modifier: Quantity: 1 CPT4 Code: Description: ICD-10 Diagnosis Description I87.2 Venous insufficiency (chronic) (peripheral) I89.0 Lymphedema, not elsewhere classified U13.244 Varicose veins of bilateral lower extremities  with other complications L98.492 Non-pressure chronic ulcer of  skin of other sites with fat layer expos Modifier: ed Quantity: Electronic Signature(s) Signed: 08/23/2020 3:37:30 PM By: Lenda Kelp PA-C Entered By: Lenda Kelp on 08/23/2020 15:37:29

## 2020-08-24 NOTE — Progress Notes (Signed)
[name], [name] ([id number]) Visit Report for 08/23/2020 Arrival Information Details Patient Name: [name], [name]. Date of Service: 08/23/2020 2:30 PM Medical Record Number: [id number] Patient Account Number: [id number] Date of Birth/Sex: 06/10/1985 (35 y.o. F) Treating RN: Hansel Feinstein Primary Care Bynum Mccullars: Lenor Coffin Other Clinician: Referring Asbury Hair: Lenor Coffin Treating Leesha Veno/Extender: Rowan Blase in Treatment: 4 Visit Information History Since Last Visit Added or deleted any medications: No Patient Arrived: Ambulatory Had a fall or experienced change in No Arrival Time: 14:48 activities of daily living that may affect Accompanied By: self risk of falls: Transfer Assistance: None Hospitalized since last visit: No Patient Identification Verified: Yes Has Dressing in Place as Prescribed: Yes Secondary Verification Process Completed: Yes Pain Present Now: No Patient Requires Transmission-Based Precautions: No Patient Has Alerts: No Electronic Signature(s) Signed: 08/24/2020 12:22:13 PM By: Hansel Feinstein Entered By: Hansel Feinstein on 08/23/2020 14:48:55 Zietz, [name] ([id number]) -------------------------------------------------------------------------------- Clinic Level of Care Assessment Details Patient Name: [name], [name] Date of Service: 08/23/2020 2:30 PM Medical Record Number: [id number] Patient Account Number: [id number] Date of Birth/Sex: 1985-10-25 (35 y.o. F) Treating RN: Rogers Blocker Primary Care Roderic Lammert: Lenor Coffin Other Clinician: Referring Moraima Burd: Lenor Coffin Treating Rini Moffit/Extender: Rowan Blase in Treatment: 4 Clinic Level of Care Assessment Items TOOL 4 Quantity Score X - Use when only an EandM is performed on FOLLOW-UP visit 1 0 ASSESSMENTS - Nursing Assessment / Reassessment X - Reassessment of Co-morbidities (includes updates in patient status) 1 10 X- 1 5 Reassessment of Adherence to Treatment Plan ASSESSMENTS -  Wound and Skin Assessment / Reassessment X - Simple Wound Assessment / Reassessment - one wound 1 5 []  - 0 Complex Wound Assessment / Reassessment - multiple wounds []  - 0 Dermatologic / Skin Assessment (not related to wound area) ASSESSMENTS - Focused Assessment []  - Circumferential Edema Measurements - multi extremities 0 []  - 0 Nutritional Assessment / Counseling / Intervention []  - 0 Lower Extremity Assessment (monofilament, tuning fork, pulses) []  - 0 Peripheral Arterial Disease Assessment (using hand held doppler) ASSESSMENTS - Ostomy and/or Continence Assessment and Care []  - Incontinence Assessment and Management 0 []  - 0 Ostomy Care Assessment and Management (repouching, etc.) PROCESS - Coordination of Care X - Simple Patient / Family Education for ongoing care 1 15 []  - 0 Complex (extensive) Patient / Family Education for ongoing care []  - 0 Staff obtains , Records, Test Results / Process Orders []  - 0 Staff telephones HHA, Nursing Homes / Clarify orders / etc []  - 0 Routine Transfer to another Facility (non-emergent condition) []  - 0 Routine Hospital Admission (non-emergent condition) []  - 0 New Admissions / / Ordering NPWT, Apligraf, etc. []  - 0 Emergency Hospital Admission (emergent condition) X- 1 10 Simple Discharge Coordination []  - 0 Complex (extensive) Discharge Coordination PROCESS - Special Needs []  - Pediatric / Minor Patient Management 0 []  - 0 Isolation Patient Management []  - 0 Hearing / Language / Visual special needs []  - 0 Assessment of Community assistance (transportation, D/C planning, etc.) []  - 0 Additional assistance / Altered mentation []  - 0 Support Surface(s) Assessment (bed, cushion, seat, etc.) INTERVENTIONS - Wound Cleansing / Measurement Roarty, Josephene J. ( ) X- 1 5 Simple Wound Cleansing - one wound []  - 0 Complex Wound Cleansing - multiple wounds X- 1 5 Wound Imaging (photographs  - any number of wounds) []  - 0 Wound Tracing (instead of photographs) X- 1 5 Simple Wound Measurement - one wound []  - 0 Complex Wound  Measurement - multiple wounds INTERVENTIONS - Wound Dressings []  - Small Wound Dressing one or multiple wounds 0 X- 1 15 Medium Wound Dressing one or multiple wounds []  - 0 Large Wound Dressing one or multiple wounds []  - 0 Application of Medications - topical []  - 0 Application of Medications - injection INTERVENTIONS - Miscellaneous []  - External ear exam 0 []  - 0 Specimen Collection (cultures, biopsies, blood, body fluids, etc.) []  - 0 Specimen(s) / Culture(s) sent or taken to Lab for analysis []  - 0 Patient Transfer (multiple staff / / Similar devices) []  - 0 Simple Staple / Suture removal (25 or less) []  - 0 Complex Staple / Suture removal (26 or more) []  - 0 Hypo / Hyperglycemic Management (close monitor of Blood Glucose) []  - 0 Ankle / Brachial Index (ABI) - do not check if billed separately X- 1 5 Vital Signs Has the patient been seen at the hospital within the last three years: Yes Total Score: 80 Level Of Care: New/Established - Level 3 Electronic Signature(s) Signed: 08/23/2020 3:56:49 PM By: , RN Entered By: , on 08/23/2020 15:35:09 Franzen, (Nurse, adult) -------------------------------------------------------------------------------- Encounter Discharge Information Details Patient Name: [name], [name]. Date of Service: 08/23/2020 2:30 PM Medical Record Number: Patient Account Number: Date of Birth/Sex: 09/23/1985 (35 y.o. F) Treating RN: Dondra Prader Primary Care Buryl Bamber: Phillis Haggis Other Clinician: Referring Yariel Ferraris: Dondra Prader Treating Ricki Clack/Extender: 10/23/2020 in Treatment: 4 Encounter Discharge Information Items Discharge Condition: Stable Ambulatory Status: Ambulatory Discharge Destination:  Home Transportation: Private Auto Accompanied By: self Schedule Follow-up Appointment: Yes Clinical Summary of Care: Electronic Signature(s) Signed: 08/23/2020 4:52:40 PM By: [id number] Entered By: [name] Simmonds on 08/23/2020 15:47:04 Obryan, [id number] ([id number]) -------------------------------------------------------------------------------- Lower Extremity Assessment Details Patient Name: [name], [name] Date of Service: 08/23/2020 2:30 PM Medical Record Number: Rogers Blocker Patient Account Number: Lenor Coffin Date of Birth/Sex: 02/07/1986 (35 y.o. F) Treating RN: 10/23/2020 Primary Care Arrow Tomko: Lolita Cram Other Clinician: Referring Santos Sollenberger: Lolita Cram Treating Olivine Hiers/Extender: 10/23/2020 Weeks in Treatment: 4 Edema Assessment Assessed: [name]: Yes] [Right: No] Edema: [Left: Ye] [Right: s] Calf Left: Right: Point of Measurement: 31 cm From Medial Instep 60 cm Ankle Left: Right: Point of Measurement: 9 cm From Medial Instep 31 cm Knee To Floor Left: Right: From Medial Instep 41 cm Vascular Assessment Pulses: Dorsalis Pedis Palpable: [Left:Yes] Electronic Signature(s) Signed: 08/24/2020 12:22:13 PM By: [name] Simmonds Entered By: 10/23/2020 on 08/23/2020 14:55:31 Hirth, [id number] (03/23/1986) -------------------------------------------------------------------------------- Multi Wound Chart Details Patient Name: [name], [name] Date of Service: 08/23/2020 2:30 PM Medical Record Number: Lenor Coffin Patient Account Number: Lenor Coffin Date of Birth/Sex: 08-30-1985 (35 y.o. F) Treating RN: 10/24/2020 Primary Care Sherma Vanmetre: Hansel Feinstein Other Clinician: Referring Kru Allman: Hansel Feinstein Treating Raynold Blankenbaker/Extender: 10/23/2020 in Treatment: 4 Vital Signs Height(in): 66 Pulse(bpm): 99 Weight(lbs): 388 Blood Pressure(mmHg): 111/83 Body Mass Index(BMI): 63 Temperature(F): 98.5 Respiratory Rate(breaths/min): 16 Photos: [N/A:N/A] Wound Location:  Left, Distal, Medial Lower Leg N/A N/A Wounding Event: Gradually Appeared N/A N/A Primary Etiology: Venous Leg Ulcer N/A N/A Secondary Etiology: Lymphedema N/A N/A Comorbid History: Hypertension, Peripheral Venous N/A N/A Disease Date Acquired: 07/18/2020 N/A N/A Weeks of Treatment: 4 N/A N/A Wound Status: Open N/A N/A Measurements L x W x D (cm) 0.4x0.4x0.1 N/A N/A Area (cm) : 0.126 N/A N/A Volume (cm) : 0.013 N/A N/A % Reduction in Area: -306.50% N/A N/A % Reduction in Volume: -333.30% N/A N/A Classification: Full Thickness Without  Exposed N/A N/A Support Structures Exudate Amount: Medium N/A N/A Exudate Type: Serosanguineous N/A N/A Exudate Color: red, brown N/A N/A Granulation Amount: Small (1-33%) N/A N/A Granulation Quality: Red N/A N/A Necrotic Amount: Large (67-100%) N/A N/A Exposed Structures: Fat Layer (Subcutaneous Tissue): N/A N/A Yes Fascia: No Tendon: No Muscle: No Joint: No Bone: No Epithelialization: None N/A N/A Treatment Notes Electronic Signature(s) Signed: 08/23/2020 3:56:49 PM By: Phillis Haggis, Dondra Prader RN Entered By: Phillis Haggis, Dondra Prader on 08/23/2020 15:31:11 Weier, [name] ([id number]) -------------------------------------------------------------------------------- Multi-Disciplinary Care Plan Details Patient Name: [name], [name]. Date of Service: 08/23/2020 2:30 PM Medical Record Number: [id number] Patient Account Number: [id number] Date of Birth/Sex: Feb 06, 1986 (35 y.o. F) Treating RN: Rogers Blocker Primary Care Lavance Beazer: Lenor Coffin Other Clinician: Referring Jaqwon Manfred: Lenor Coffin Treating Halea Lieb/Extender: Rowan Blase in Treatment: 4 Active Inactive Wound/Skin Impairment Nursing Diagnoses: Impaired tissue integrity Goals: Patient/caregiver will verbalize understanding of skin care regimen Date Initiated: 07/26/2020 Target Resolution Date: 07/26/2020 Goal Status: Active Ulcer/skin breakdown will have a volume reduction of  30% by week 4 Date Initiated: 07/26/2020 Target Resolution Date: 08/25/2020 Goal Status: Active Ulcer/skin breakdown will have a volume reduction of 50% by week 8 Date Initiated: 07/26/2020 Target Resolution Date: 09/25/2020 Goal Status: Active Ulcer/skin breakdown will have a volume reduction of 80% by week 12 Date Initiated: 07/26/2020 Target Resolution Date: 10/25/2020 Goal Status: Active Ulcer/skin breakdown will heal within 14 weeks Date Initiated: 07/26/2020 Target Resolution Date: 11/25/2020 Goal Status: Active Interventions: Assess patient/caregiver ability to obtain necessary supplies Assess patient/caregiver ability to perform ulcer/skin care regimen upon admission and as needed Assess ulceration(s) every visit Provide education on ulcer and skin care Treatment Activities: Referred to DME Layla Gramm for dressing supplies : 07/26/2020 Skin care regimen initiated : 07/26/2020 Notes: Electronic Signature(s) Signed: 08/23/2020 3:56:49 PM By: Phillis Haggis, Dondra Prader RN Entered By: Phillis Haggis, Dondra Prader on 08/23/2020 15:31:04 Hartin, [name] ([id number]) -------------------------------------------------------------------------------- Pain Assessment Details Patient Name: [name], [name] Date of Service: 08/23/2020 2:30 PM Medical Record Number: [id number] Patient Account Number: [id number] Date of Birth/Sex: 1985-06-13 (35 y.o. F) Treating RN: Hansel Feinstein Primary Care Baylin Gamblin: Lenor Coffin Other Clinician: Referring Addisson Frate: Lenor Coffin Treating Jenney Brester/Extender: Rowan Blase in Treatment: 4 Active Problems Location of Pain Severity and Description of Pain Patient Has Paino No Site Locations Rate the pain. Current Pain Level: 0 Pain Management and Medication Current Pain Management: Electronic Signature(s) Signed: 08/24/2020 12:22:13 PM By: Hansel Feinstein Entered By: Hansel Feinstein on 08/23/2020 14:52:12 Munroe, [name]  ([id number]) -------------------------------------------------------------------------------- Patient/Caregiver Education Details Patient Name: CLOTIEL, TROOP Date of Service: 08/23/2020 2:30 PM Medical Record Number: [id number] Patient Account Number: [id number] Date of Birth/Gender: 1985-07-05 (35 y.o. F) Treating RN: Rogers Blocker Primary Care Physician: Lenor Coffin Other Clinician: Referring Physician: Lenor Coffin Treating Physician/Extender: Rowan Blase in Treatment: 4 Education Assessment Education Provided To: Patient Education Topics Provided Wound/Skin Impairment: Methods: Explain/Verbal Responses: State content correctly Electronic Signature(s) Signed: 08/23/2020 3:56:49 PM By: Phillis Haggis, Dondra Prader RN Entered By: Phillis Haggis, Dondra Prader on 08/23/2020 15:35:35 [name], [name] ([id number]) -------------------------------------------------------------------------------- Wound Assessment Details Patient Name: [name], [name]. Date of Service: 08/23/2020 2:30 PM Medical Record Number: [id number] Patient Account Number: [id number] Date of Birth/Sex: [birth year] (35 y.o. F) Treating RN: Hansel Feinstein Primary Care Nakoa Ganus: Lenor Coffin Other Clinician: Referring Bud Kaeser: Lenor Coffin Treating Judy Goodenow/Extender: Rowan Blase in Treatment: 4 Wound Status Wound Number: 3 Primary Etiology: Venous Leg Ulcer Wound Location: Left, Distal, Medial Lower Leg Secondary Etiology: Lymphedema Wounding Event: Gradually Appeared Wound  Status: Open Date Acquired: 07/18/2020 Comorbid History: Hypertension, Peripheral Venous Disease Weeks Of Treatment: 4 Clustered Wound: No Photos Wound Measurements Length: (cm) 0.4 Width: (cm) 0.4 Depth: (cm) 0.1 Area: (cm) 0.126 Volume: (cm) 0.013 % Reduction in Area: -306.5% % Reduction in Volume: -333.3% Epithelialization: None Tunneling: No Undermining: No Wound Description Classification: Full Thickness Without Exposed  Support Structures Exudate Amount: Medium Exudate Type: Serosanguineous Exudate Color: red, brown Foul Odor After Cleansing: No Slough/Fibrino Yes Wound Bed Granulation Amount: Small (1-33%) Exposed Structure Granulation Quality: Red Fascia Exposed: No Necrotic Amount: Large (67-100%) Fat Layer (Subcutaneous Tissue) Exposed: Yes Necrotic Quality: Adherent Slough Tendon Exposed: No Muscle Exposed: No Joint Exposed: No Bone Exposed: No Treatment Notes Wound #3 (Lower Leg) Wound Laterality: Left, Medial, Distal Cleanser Soap and Water Discharge Instruction: Gently cleanse wound with antibacterial soap, rinse and pat dry prior to dressing wounds Peri-Wound Care [name], [name] ([id number]) Topical Primary Dressing Iodosorb 40 (g) Discharge Instruction: Apply IodoSorb to wound bed only as directed. Secondary Dressing ABD Pad 5x9 (in/in) Discharge Instruction: Cover with ABD pad Secured With 71M ACE Elastic Bandage With VELCRO Brand Closure, 4 (in) Discharge Instruction: Wrap from ankle to calf-patient to adjust if ACE wrap falls Compression Wrap Compression Stockings Add-Ons Electronic Signature(s) Signed: 08/24/2020 12:22:13 PM By: Hansel FeinsteinBishop, Joy Entered By: Hansel FeinsteinBishop, Joy on 08/23/2020 14:53:56 Deen, [name] ([id number]) -------------------------------------------------------------------------------- Vitals Details Patient Name: [name], Juanelle J. Date of Service: 08/23/2020 2:30 PM Medical Record Number: [id number] Patient Account Number: [id number] Date of Birth/Sex: 10/30/1985 (35 y.o. F) Treating RN: Hansel FeinsteinBishop, Joy Primary Care Ulys Favia: Lenor Coffinlson, Daniel Other Clinician: Referring Sencere Symonette: Lenor Coffinlson, Daniel Treating Alexes Menchaca/Extender: Rowan BlaseStone, Hoyt Weeks in Treatment: 4 Vital Signs Time Taken: 14:47 Temperature (F): 98.5 Height (in): 66 Pulse (bpm): 99 Weight (lbs): 388 Respiratory Rate (breaths/min): 16 Body Mass Index (BMI): 62.6 Blood Pressure (mmHg): 111/83 Reference  Range: 80 - 120 mg / dl Electronic Signature(s) Signed: 08/24/2020 12:22:13 PM By: Hansel FeinsteinBishop, Joy Entered ByHansel Feinstein: Bishop, Joy on 08/23/2020 14:52:05

## 2020-08-30 ENCOUNTER — Other Ambulatory Visit: Payer: Self-pay

## 2020-08-30 ENCOUNTER — Encounter: Payer: BC Managed Care – PPO | Admitting: Physician Assistant

## 2020-08-30 DIAGNOSIS — I872 Venous insufficiency (chronic) (peripheral): Secondary | ICD-10-CM | POA: Diagnosis not present

## 2020-08-30 NOTE — Progress Notes (Addendum)
MADHURI, VACCA ([id number]) Visit Report for 08/30/2020 Chief Complaint Document Details Patient Name: [name], [name]. Date of Service: 08/30/2020 12:30 PM Medical Record Number: [id number] Patient Account Number: [id number] Date of Birth/Sex: 08/11/1985 (35 y.o. F) Treating RN: Rogers Blocker Primary Care Provider: Lenor Coffin Other Clinician: Referring Provider: Lenor Coffin Treating Provider/Extender: Rowan Blase in Treatment: 5 Information Obtained from: Patient Chief Complaint Left LE Ulcer Electronic Signature(s) Signed: 08/30/2020 12:56:12 PM By: Lenda Kelp PA-C Entered By: Lenda Kelp on 08/30/2020 12:56:12 Bourbon, [name] -------------------------------------------------------------------------------- Debridement Details Patient Name: [name], [name] Date of Service: 08/30/2020 12:30 PM Medical Record Number: [id number] Patient Account Number: [id number] Date of Birth/Sex: 06/17/1985 (35 y.o. F) Treating RN: Rogers Blocker Primary Care Provider: Lenor Coffin Other Clinician: Referring Provider: Lenor Coffin Treating Provider/Extender: Rowan Blase in Treatment: 5 Debridement Performed for Wound #3 Left,Distal,Medial Lower Leg Assessment: Performed By: Physician Nelida Meuse., PA-C Debridement Type: Debridement Severity of Tissue Pre Debridement: Fat layer exposed Level of Consciousness (Pre- Awake and Alert procedure): Pre-procedure Verification/Time Out Yes - 13:11 Taken: Start Time: 13:11 Pain Control: Lidocaine 4% Topical Solution Total Area Debrided (L x W): 1 (cm) x 0.6 (cm) = 0.6 (cm) Tissue and other material Non-Viable, Biofilm, Fibrin/Exudate debrided: Level: Non-Viable Tissue Debridement Description: Selective/Open Wound Instrument: Curette Bleeding: Minimum Hemostasis Achieved: Pressure Response to Treatment: Procedure was tolerated well Level of Consciousness (Post- Awake and Alert procedure): Post  Debridement Measurements of Total Wound Length: (cm) 1 Width: (cm) 0.6 Depth: (cm) 0.2 Volume: (cm) 0.094 Character of Wound/Ulcer Post Debridement: Stable Severity of Tissue Post Debridement: Fat layer exposed Post Procedure Diagnosis Same as Pre-procedure Electronic Signature(s) Signed: 08/30/2020 3:02:05 PM By: Lajean Manes RN Signed: 08/30/2020 3:13:06 PM By: Lenda Kelp PA-C Entered By: Phillis Haggis, Dondra Prader on 08/30/2020 13:12:34 Carattini, [name] ([id number]) -------------------------------------------------------------------------------- HPI Details Patient Name: [name], [name] Date of Service: 08/30/2020 12:30 PM Medical Record Number: [id number] Patient Account Number: [id number] Date of Birth/Sex: Feb 26, 1986 (35 y.o. F) Treating RN: Rogers Blocker Primary Care Provider: Lenor Coffin Other Clinician: Referring Provider: Lenor Coffin Treating Provider/Extender: Rowan Blase in Treatment: 5 History of Present Illness HPI Description: 35 year old patient who started with having ulcerations on the right lower leg on the lateral part of her ankle for about 2 weeks. She was seen in the ER at [facility] and advised to see the wound care for a consultation. No X-rays of workup was done during the ER visit and no prescription for any medications of compression wraps were given. the patient is not diabetic but does have hypertension and her medications have been reviewed by me. In July 2013 she was seen by renal and vascular services of 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 The right and left greater saphenous vein was found to be tortuous. Deep venous system was also not competent and there was reflux of more than 500 ms. She was then seen by Dr. Tawanna Cooler Early who recommended that the patient would not benefit from endovenous ablation and he had  recommended vein stripping on the right side and multiple small phlebectomy procedures on the left side. the patient did not follow-up due to social economic reasons. She has not been wearing any compression stockings and has not taken any specific treatment for varicose veins for the last 3 years. 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 We have obtained some appointments to see the vascular surgeons by the end of the month and the patient would like to follow up [facility] Wednesday, June [facility]. 10/14/2014 -- [place] and hence has come for a review today. [facility]. She is doing fine otherwise. 10/22/2014 -- she was seen by Dr. Gretta Beganodd Early and he has recommended surgical removal of her right saphenous vein from distal thigh to saphenofemoral junction and stab phlebectomy's of multiple large tributary branches throughout her thigh and calf. This would be done under general anesthesia in the outpatient setting. 10/29/2014 -- she is trying to work on a surgical date and in the meanwhile we have got insurance clearance for Apligraf and we will start this next week. 7/22 2016 -- she is here for the first application of Apligraf. 11/19/2014 -- she is here for a second application of Apligraf 11/26/2014 -- she has done fine after her last application of Apligraf and is awaiting her surgery which is scheduled for August 31. 12/03/2014 -- she is doing fine and is here for her third application of Apligraf. 12/21/2014 -- She had surgery on 12/15/2014 by Dr.Early who did #1 ligation and stripping of right great saphenous vein from distal thigh to saphenofemoral junction, #2 stab phlebectomy of large tributary varicose veins in the thigh popliteal space and calf. She had an Ace wrap up to her  groin and this was removed today and the Unna's boot was also removed. 12/28/2014 -- she is here for her fourth application of Apligraf. 01/06/2015 - he saw her vascular surgeon Dr. Arbie CookeyEarly who was pleased with her progress and he has confirmed that no surgical procedures could be attempted on the left side. 01/13/2015 -- her wound looks very good and she's been having no problems whatsoever. Readmission: 07/26/2020 upon evaluation today patient presents for initial inspection here in our clinic for a new issue with her left leg although she is previously been seen due to issues with the right leg back in 2016. At that time she was seeing Dr. Arbie CookeyEarly [place] He has since semiretired from what I understand. He is working out of [place] I believe. Nonetheless she tells me at the time that there was really nothing to do for her left leg although the right leg was where they did most of the work. Subsequently she states that she is done fairly well until just in the past week where she had issues with bleeding from what appears to be varicose vein on the left leg medially. Unfortunately this has continued to be an issue although she tells me at first it was coming much more significantly Down quite a bit but nonetheless has not completely resolved. Every time she showers she notices that it starts to drain a little bit more. She does have a history of chronic venous insufficiency, lymphedema, varicose veins bilaterally, and obesity. 08/02/2020 upon evaluation today patient appears to be doing about the same in regards to the ulcer on her left leg. She has some eschar covering there is definitely some fluid collecting underneath unfortunately. With that being said she tells me she is still having a tremendous amount of pain therefore she is really not able to allow me to clean this off  very effectively to be perfectly honest. I think we need to try to soften this  up 08/16/2020 upon evaluation today patient's wound is really not doing significantly better not really states about the same. She notes that the wrap just does not seem to be staying up very well at all unfortunately. No fevers, chills, nausea, vomiting, or diarrhea. She did cut it off once it starts to slide in order to alleviate some of the pressure from sliding Down. Fortunately there is no signs of active infection at this time which is great news. 08/23/2020 upon evaluation today patient appears to be doing well 08/23/2020 upon evaluation today patient appears to be doing well with regard to her wound all things considered. Fortunately there does not appear to be any signs of active infection at this time which is great news. She has been tolerating the dressing changes without complication and overall I am extremely pleased with where things stand at this point. She does have her appointment with vascular in [facility] on June 9. [name], [name] ([id number]) 08/30/2020 upon evaluation today patient actually appears to be doing decently well in regard to her wound. Fortunately there is no signs of active infection which is great news. Nonetheless I do believe that the patient is going require little bit of debridement if she is okay with me attempting that today I think that will help clean off some of the necrotic tissue. Fortunately there does not appear to be otherwise any evidence of active infection which is also great news. Electronic Signature(s) Signed: 08/30/2020 1:16:32 PM By: Lenda Kelp PA-C Entered By: Lenda Kelp on 08/30/2020 13:16:32 Pileggi, [name] ([id number]) -------------------------------------------------------------------------------- Physical Exam Details Patient Name: [name], [name]. Date of Service: 08/30/2020 12:30 PM Medical Record Number: [id number] Patient Account Number: [id number] Date of Birth/Sex: 10-11-1985 (35 y.o. F) Treating RN: Rogers Blocker Primary Care Provider: Lenor Coffin Other Clinician: Referring Provider: Lenor Coffin Treating Provider/Extender: Rowan Blase in Treatment: 5 Constitutional Well-nourished and well-hydrated in no acute distress. Respiratory normal breathing without difficulty. Psychiatric this patient is able to make decisions and demonstrates good insight into disease process. Alert and Oriented x 3. pleasant and cooperative. Notes Upon inspection patient's wound bed actually showed signs of good granulation epithelization at this point. There does not appear to be any evidence of infection which is great and overall I am extremely pleased with where things stand today. No fevers, chills, nausea, vomiting, or diarrhea. Electronic Signature(s) Signed: 08/30/2020 1:16:47 PM By: Lenda Kelp PA-C Entered By: Lenda Kelp on 08/30/2020 13:16:47 Bolick, [name] ([id number]) -------------------------------------------------------------------------------- Physician Orders Details Patient Name: [name], [name]. Date of Service: 08/30/2020 12:30 PM Medical Record Number: [id number] Patient Account Number: [id number] Date of Birth/Sex: [birth year] (35 y.o. F) Treating RN: Rogers Blocker Primary Care Provider: Lenor Coffin Other Clinician: Referring Provider: Lenor Coffin Treating Provider/Extender: Rowan Blase in Treatment: 5 Verbal / Phone Orders: No Diagnosis Coding ICD-10 Coding Code Description I87.2 Venous insufficiency (chronic) (peripheral) I89.0 Lymphedema, not elsewhere classified I83.893 Varicose veins of bilateral lower extremities with other complications L98.492 Non-pressure chronic ulcer of skin of other sites with fat layer exposed E66.01 Morbid (severe) obesity due to excess calories Follow-up Appointments o Return Appointment in 2 weeks. Bathing/ Shower/ Hygiene o Wash wounds with antibacterial soap and water. Edema Control - Lymphedema / Segmental  Compressive Device / Other Left Lower Extremity o Elevate, Exercise [name] and Avoid Standing for Long Periods of Time. o Elevate legs to  the level of the heart and pump ankles as often as possible o Elevate leg(s) parallel to the floor when sitting. Wound Treatment Wound #3 - Lower Leg Wound Laterality: Left, Medial, Distal Cleanser: Soap and Water 3 x Per Week/15 Days Discharge Instructions: Gently cleanse wound with antibacterial soap, rinse and pat dry prior to dressing wounds Primary Dressing: Iodosorb 40 (g) (Generic) 3 x Per Week/15 Days Discharge Instructions: Apply IodoSorb to wound bed only as directed. Secondary Dressing: ABD Pad 5x9 (in/in) (Generic) 3 x Per Week/15 Days Discharge Instructions: Cover with ABD pad Secured With: 26M ACE Elastic Bandage With VELCRO Brand Closure, 4 (in) (Generic) 3 x Per Week/15 Days Discharge Instructions: Wrap from ankle to calf-patient to adjust if ACE wrap falls Electronic Signature(s) Signed: 08/30/2020 3:02:05 PM By: Phillis Haggis, Dondra Prader RN Signed: 08/30/2020 3:13:06 PM By: Lenda Kelp PA-C Entered By: Phillis Haggis, Dondra Prader on 08/30/2020 13:14:00 Rijo, [name] ([id number]) -------------------------------------------------------------------------------- Problem List Details Patient Name: [name], [name]. Date of Service: 08/30/2020 12:30 PM Medical Record Number: [id number] Patient Account Number: [id number] Date of Birth/Sex: March 05, 1986 (35 y.o. F) Treating RN: Rogers Blocker Primary Care Provider: Lenor Coffin Other Clinician: Referring Provider: Lenor Coffin Treating Provider/Extender: Rowan Blase in Treatment: 5 Active Problems ICD-10 Encounter Code Description Active Date MDM Diagnosis I87.2 Venous insufficiency (chronic) (peripheral) 07/26/2020 No Yes I89.0 Lymphedema, not elsewhere classified 07/26/2020 No Yes I83.893 Varicose veins of bilateral lower extremities with other complications 07/26/2020 No  Yes L98.492 Non-pressure chronic ulcer of skin of other sites with fat layer exposed 07/26/2020 No Yes E66.01 Morbid (severe) obesity due to excess calories 07/26/2020 No Yes Inactive Problems Resolved Problems Electronic Signature(s) Signed: 08/30/2020 12:56:02 PM By: Lenda Kelp PA-C Entered By: Lenda Kelp on 08/30/2020 12:56:01 Albea, [name] ([id number]) -------------------------------------------------------------------------------- Progress Note Details Patient Name: [name] Date of Service: 08/30/2020 12:30 PM Medical Record Number: [id number] Patient Account Number: [id number] Date of Birth/Sex: 11/05/1985 (35 y.o. F) Treating RN: Rogers Blocker Primary Care Provider: Lenor Coffin Other Clinician: Referring Provider: Lenor Coffin Treating Provider/Extender: Rowan Blase in Treatment: 5 Subjective Chief Complaint Information obtained from Patient Left LE Ulcer History of Present Illness (HPI) 35 year old patient who started with having ulcerations on the right lower leg on the lateral part of her ankle for about 2 weeks. She was seen in the ER at [facility] and advised to see the wound care for a consultation. No X-rays of workup was done during the ER visit and no prescription for any medications of compression wraps were given. the patient is not diabetic but does have hypertension and her medications have been reviewed by me. 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 The right and left greater saphenous vein was found to be tortuous. Deep venous system was also not competent and there was reflux of more than 500 ms. She was then seen by Dr. Tawanna Cooler Early who recommended that the patient would not benefit from endovenous ablation and he had recommended vein stripping on the right side and  multiple small phlebectomy procedures on the left side. the patient did not follow-up due to social economic reasons. She has not been wearing any compression stockings and has not taken any specific treatment for varicose veins for the last 3 years. 09/27/2014 -- She has developed a new wound  on the medial malleolus which is rather superficial and in the area where she has stasis dermatitis. We have obtained some appointments to see the vascular surgeons by the end of the month and the patient would like to follow up with me at my [facility] on Wednesday, June 29. 10/14/2014 -- she could not see me yesterday in [place] and hence has come for a review today. She has a vascular workup to be done this afternoon at [facility]. She is doing fine otherwise. 10/22/2014 -- she was seen by Dr. [name] and he has recommended surgical removal of her right saphenous vein from distal thigh to saphenofemoral junction and stab phlebectomy's of multiple large tributary branches throughout her thigh and calf. This would be done under general anesthesia in the outpatient setting. 10/29/2014 -- she is trying to work on a surgical date and in the meanwhile we have got insurance clearance for Apligraf and we will start this next week. 7/22 2016 -- she is here for the first application of Apligraf. 11/19/2014 -- she is here for a second application of Apligraf 11/26/2014 -- she has done fine after her last application of Apligraf and is awaiting her surgery which is scheduled for August 31. 12/03/2014 -- she is doing fine and is here for her third application of Apligraf. 12/21/2014 -- She had surgery on 12/15/2014 by Dr.Early who did #1 ligation and stripping of right great saphenous vein from distal thigh to saphenofemoral junction, #2 stab phlebectomy of large tributary varicose veins in the thigh popliteal space and calf. She had an Ace wrap up to her groin and this was removed today and the Unna's  boot was also removed. 12/28/2014 -- she is here for her fourth application of Apligraf. 01/06/2015 - he saw her vascular surgeon Dr. Arbie Cookey who was pleased with her progress and he has confirmed that no surgical procedures could be attempted on the left side. 01/13/2015 -- her wound looks very good and she's been having no problems whatsoever. Readmission: 07/26/2020 upon evaluation today patient presents for initial inspection here in our clinic for a new issue with her left leg although she is previously been seen due to issues with the right leg back in 2016. At that time she was seeing Dr. Arbie Cookey who is a vein/vascular specialist in [place]. He has since semiretired from what I understand. He is working out of [employer] I believe. Nonetheless she tells me at the time that there was really nothing to do for her left leg although the right leg was where they did most of the work. Subsequently she states that she is done fairly well until just in the past week where she had issues with bleeding from what appears to be varicose vein on the left leg medially. Unfortunately this has continued to be an issue although she tells me at first it was coming much more significantly Down quite a bit but nonetheless has not completely resolved. Every time she showers she notices that it starts to drain a little bit more. She does have a history of chronic venous insufficiency, lymphedema, varicose veins bilaterally, and obesity. 08/02/2020 upon evaluation today patient appears to be doing about the same in regards to the ulcer on her left leg. She has some eschar covering there is definitely some fluid collecting underneath unfortunately. With that being said she tells me she is still having a tremendous amount of pain therefore she is really not able to allow me to clean this off very  effectively to be perfectly honest. I think we need to try to soften this up 08/16/2020 upon evaluation today patient's wound  is really not doing significantly better not really states about the same. She notes that the wrap just does not seem to be staying up very well at all unfortunately. No fevers, chills, nausea, vomiting, or diarrhea. She did cut it off once it starts to slide in order to alleviate some of the pressure from sliding Down. Fortunately there is no signs of active infection at this time which is great news. [name], [name] ([id number]) 08/23/2020 upon evaluation today patient appears to be doing well 08/23/2020 upon evaluation today patient appears to be doing well with regard to her wound all things considered. Fortunately there does not appear to be any signs of active infection at this time which is great news. She has been tolerating the dressing changes without complication and overall I am extremely pleased with where things stand at this point. She does have her appointment with vascular in [facility] on June 9. 08/30/2020 upon evaluation today patient actually appears to be doing decently well in regard to her wound. Fortunately there is no signs of active infection which is great news. Nonetheless I do believe that the patient is going require little bit of debridement if she is okay with me attempting that today I think that will help clean off some of the necrotic tissue. Fortunately there does not appear to be otherwise any evidence of active infection which is also great news. Objective Constitutional Well-nourished and well-hydrated in no acute distress. Vitals Time Taken: 12:49 PM, Height: 66 in, Weight: 388 lbs, BMI: 62.6, Temperature: 98.3 F, Pulse: 81 bpm, Respiratory Rate: 16 breaths/min, Blood Pressure: 119/80 mmHg. Respiratory normal breathing without difficulty. Psychiatric this patient is able to make decisions and demonstrates good insight into disease process. Alert and Oriented x 3. pleasant and cooperative. General Notes: Upon inspection patient's wound bed actually showed  signs of good granulation epithelization at this point. There does not appear to be any evidence of infection which is great and overall I am extremely pleased with where things stand today. No fevers, chills, nausea, vomiting, or diarrhea. Integumentary (Hair, Skin) Wound #3 status is Open. Original cause of wound was Gradually Appeared. The date acquired was: 07/18/2020. The wound has been in treatment 5 weeks. The wound is located on the Left,Distal,Medial Lower Leg. The wound measures 1cm length x 0.6cm width x 0.1cm depth; 0.471cm^2 area and 0.047cm^3 volume. There is Fat Layer (Subcutaneous Tissue) exposed. There is no tunneling or undermining noted. There is a medium amount of serosanguineous drainage noted. There is small (1-33%) red granulation within the wound bed. There is a large (67-100%) amount of necrotic tissue within the wound bed including Adherent Slough. Assessment Active Problems ICD-10 Venous insufficiency (chronic) (peripheral) Lymphedema, not elsewhere classified Varicose veins of bilateral lower extremities with other complications Non-pressure chronic ulcer of skin of other sites with fat layer exposed Morbid (severe) obesity due to excess calories Procedures Wound #3 Pre-procedure diagnosis of Wound #3 is a Venous Leg Ulcer located on the Left,Distal,Medial Lower Leg .Severity of Tissue Pre Debridement is: Fat layer exposed. There was a Selective/Open Wound Non-Viable Tissue Debridement with a total area of 0.6 sq cm performed by Nelida Meuse., PA-C. With the following instrument(s): Curette to remove Non-Viable tissue/material. Material removed includes Biofilm and Fibrin/Exudate and after achieving pain control using Lidocaine 4% Topical Solution. A time out was conducted at 13:11, prior  to the start of the procedure. A Minimum amount of bleeding was controlled with Pressure. The procedure was tolerated well. Post Debridement Measurements: 1cm length x 0.6cm width  x 0.2cm depth; 0.094cm^3 volume. Character of Wound/Ulcer Post Debridement is stable. Severity of Tissue Post Debridement is: Fat layer exposed. Post procedure Diagnosis Wound #3: Same as Pre-Procedure [name], [name] ([id number]) Plan Follow-up Appointments: Return Appointment in 2 weeks. Bathing/ Shower/ Hygiene: Wash wounds with antibacterial soap and water. Edema Control - Lymphedema / Segmental Compressive Device / Other: Elevate, Exercise [name] and Avoid Standing for Long Periods of Time. Elevate legs to the level of the heart and pump ankles as often as possible Elevate leg(s) parallel to the floor when sitting. WOUND #3: - Lower Leg Wound Laterality: Left, Medial, Distal Cleanser: Soap and Water 3 x Per Week/15 Days Discharge Instructions: Gently cleanse wound with antibacterial soap, rinse and pat dry prior to dressing wounds Primary Dressing: Iodosorb 40 (g) (Generic) 3 x Per Week/15 Days Discharge Instructions: Apply IodoSorb to wound bed only as directed. Secondary Dressing: ABD Pad 5x9 (in/in) (Generic) 3 x Per Week/15 Days Discharge Instructions: Cover with ABD pad Secured With: 74M ACE Elastic Bandage With VELCRO Brand Closure, 4 (in) (Generic) 3 x Per Week/15 Days Discharge Instructions: Wrap from ankle to calf-patient to adjust if ACE wrap falls 1. Would recommend that we going continue with the wound care measures as before and the patient is in agreement with the plan this includes the use of the Iodosorb which I think seems to be doing a great job for her. 2. I am also can recommend an ABD pad be used to cover and then she is using an Ace wrap since unfortunately she is really not able to tolerate the compression wraps. They keep sliding down and therefore not effective. 3. I am also can recommend the patient continue to elevate her legs much as possible. She does have appointment with vascular for a vein consultation on June 9. We will see patient back for  reevaluation in 2 weeks here in the clinic. If anything worsens or changes patient will contact our office for additional recommendations. Electronic Signature(s) Signed: 08/30/2020 1:17:42 PM By: Lenda Kelp PA-C Entered By: Lenda Kelp on 08/30/2020 13:17:42 Viverette, [name] ([id number]) -------------------------------------------------------------------------------- SuperBill Details Patient Name: [name], [name] Date of Service: 08/30/2020 Medical Record Number: [id number] Patient Account Number: [id number] Date of Birth/Sex: Jan 25, 1986 (35 y.o. 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 E66.01 Morbid (severe) obesity due to excess calories Facility Procedures CPT4 Code: 73220254 Description: [phone] - DEBRIDE WOUND 1ST 20 SQ CM OR < Modifier: Quantity: 1 CPT4 Code: Description: ICD-10 Diagnosis Description L98.492 Non-pressure chronic ulcer of skin of other sites with fat layer exposed Modifier: Quantity: Physician Procedures CPT4 Code: 3762831 Description: 97597 - WC PHYS DEBR WO ANESTH 20 SQ CM Modifier: Quantity: 1 CPT4 Code: Description: ICD-10 Diagnosis Description L98.492 Non-pressure chronic ulcer of skin of other sites with fat layer exposed Modifier: Quantity: Electronic Signature(s) Signed: 08/30/2020 1:17:59 PM By: Lenda Kelp PA-C Entered By: Lenda Kelp on 08/30/2020 13:17:59

## 2020-08-30 NOTE — Progress Notes (Signed)
[name], Ikhlas J. ([id number]) Visit Report for 08/30/2020 Arrival Information Details Patient Name: [name], [name] Date of Service: 08/30/2020 12:30 PM Medical Record Number: [id number] Patient Account Number: [id number] Date of Birth/Sex: 12/29/1985 (35 y.o. F) Treating RN: Yevonne PaxEpps, [name] Primary Care Oracio Galen: Lenor Coffinlson, Daniel Other Clinician: Referring Astor Gentle: Lenor Coffinlson, Daniel Treating Jarquez Mestre/Extender: Rowan BlaseStone, Hoyt Weeks in Treatment: 5 Visit Information History Since Last Visit All ordered tests and consults were completed: No Patient Arrived: Ambulatory Added or deleted any medications: No Arrival Time: 12:43 Any new allergies or adverse reactions: No Accompanied By: self Had a fall or experienced change in No Transfer Assistance: None activities of daily living that may affect Patient Identification Verified: Yes risk of falls: Secondary Verification Process Completed: Yes Signs or symptoms of abuse/neglect since last visito No Patient Requires Transmission-Based Precautions: No Hospitalized since last visit: No Patient Has Alerts: No Implantable device outside of the clinic excluding No cellular tissue based products placed in the center since last visit: Has Dressing in Place as Prescribed: Yes Has Compression in Place as Prescribed: Yes Pain Present Now: No Electronic Signature(s) Signed: 08/30/2020 5:43:53 PM By: Yevonne PaxEpps, [name] RN Entered By: Yevonne PaxEpps, [name] on 08/30/2020 12:49:35 Santy, [name] ([id number]) -------------------------------------------------------------------------------- Clinic Level of Care Assessment Details Patient Name: [name], [name] Date of Service: 08/30/2020 12:30 PM Medical Record Number: [id number] Patient Account Number: [id number] Date of Birth/Sex: 12/29/1985 (35 y.o. F) Treating RN: Rogers BlockerSanchez, Kenia Primary Care Devonna Oboyle: Lenor Coffinlson, Daniel Other Clinician: Referring Ly Bacchi: Lenor Coffinlson, Daniel Treating Jiaire Rosebrook/Extender: Rowan BlaseStone, Hoyt Weeks in  Treatment: 5 Clinic Level of Care Assessment Items TOOL 1 Quantity Score []  - Use when EandM and Procedure is performed on INITIAL visit 0 ASSESSMENTS - Nursing Assessment / Reassessment []  - General Physical Exam (combine w/ comprehensive assessment (listed just below) when performed on new 0 pt. evals) []  - 0 Comprehensive Assessment (HX, ROS, Risk Assessments, Wounds Hx, etc.) ASSESSMENTS - Wound and Skin Assessment / Reassessment []  - Dermatologic / Skin Assessment (not related to wound area) 0 ASSESSMENTS - Ostomy and/or Continence Assessment and Care []  - Incontinence Assessment and Management 0 []  - 0 Ostomy Care Assessment and Management (repouching, etc.) PROCESS - Coordination of Care []  - Simple Patient / Family Education for ongoing care 0 []  - 0 Complex (extensive) Patient / Family Education for ongoing care []  - 0 Staff obtains ChiropractorConsents, Records, Test Results / Process Orders []  - 0 Staff telephones HHA, Nursing Homes / Clarify orders / etc []  - 0 Routine Transfer to another Facility (non-emergent condition) []  - 0 Routine Hospital Admission (non-emergent condition) []  - 0 New Admissions / Manufacturing engineernsurance Authorizations / Ordering NPWT, Apligraf, etc. []  - 0 Emergency Hospital Admission (emergent condition) PROCESS - Special Needs []  - Pediatric / Minor Patient Management 0 []  - 0 Isolation Patient Management []  - 0 Hearing / Language / Visual special needs []  - 0 Assessment of Community assistance (transportation, D/C planning, etc.) []  - 0 Additional assistance / Altered mentation []  - 0 Support Surface(s) Assessment (bed, cushion, seat, etc.) INTERVENTIONS - Miscellaneous []  - External ear exam 0 []  - 0 Patient Transfer (multiple staff / Nurse, adultHoyer Lift / Similar devices) []  - 0 Simple Staple / Suture removal (25 or less) []  - 0 Complex Staple / Suture removal (26 or more) []  - 0 Hypo/Hyperglycemic Management (do not check if billed separately) []  -  0 Ankle / Brachial Index (ABI) - do not check if billed separately Has the patient been seen at the hospital within the last  three years: Yes Total Score: 0 Level Of Care: ____ [name] ([id number]) Electronic Signature(s) Signed: 08/30/2020 3:02:05 PM By: Phillis Haggis, Dondra Prader RN Entered By: Phillis Haggis, Dondra Prader on 08/30/2020 13:14:14 Branan, [name] ([id number]) -------------------------------------------------------------------------------- Encounter Discharge Information Details Patient Name: [name], [name] Date of Service: 08/30/2020 12:30 PM Medical Record Number: [id number] Patient Account Number: [id number] Date of Birth/Sex: Oct 05, 1985 (35 y.o. F) Treating RN: Rogers Blocker Primary Care Tyasia Packard: Lenor Coffin Other Clinician: Referring Malak Orantes: Lenor Coffin Treating Quinnlan Abruzzo/Extender: Rowan Blase in Treatment: 5 Encounter Discharge Information Items Post Procedure Vitals Discharge Condition: Stable Temperature (F): 98.3 Ambulatory Status: Ambulatory Pulse (bpm): 81 Discharge Destination: Home Respiratory Rate (breaths/min): 16 Transportation: Private Auto Blood Pressure (mmHg): 119/80 Accompanied By: self Schedule Follow-up Appointment: Yes Clinical Summary of Care: Electronic Signature(s) Signed: 08/30/2020 2:43:37 PM By: Lolita Cram Entered By: Lolita Cram on 08/30/2020 13:24:20 Hsia, [name] ([id number]) -------------------------------------------------------------------------------- Multi Wound Chart Details Patient Name: [name] Date of Service: 08/30/2020 12:30 PM Medical Record Number: [id number] Patient Account Number: [id number] Date of Birth/Sex: 05-27-1985 (35 y.o. F) Treating RN: Rogers Blocker Primary Care Olyver Hawes: Lenor Coffin Other Clinician: Referring Keyanah Kozicki: Lenor Coffin Treating Malanie Koloski/Extender: Rowan Blase in Treatment: 5 Vital Signs Height(in): 66 Pulse(bpm): 81 Weight(lbs): 388 Blood  Pressure(mmHg): 119/80 Body Mass Index(BMI): 63 Temperature(F): 98.3 Respiratory Rate(breaths/min): 16 Photos: [N/A:N/A] Wound Location: Left, Distal, Medial Lower Leg N/A N/A Wounding Event: Gradually Appeared N/A N/A Primary Etiology: Venous Leg Ulcer N/A N/A Secondary Etiology: Lymphedema N/A N/A Comorbid History: Hypertension, Peripheral Venous N/A N/A Disease Date Acquired: 07/18/2020 N/A N/A Weeks of Treatment: 5 N/A N/A Wound Status: Open N/A N/A Measurements L x W x D (cm) 1x0.6x0.1 N/A N/A Area (cm) : 0.471 N/A N/A Volume (cm) : 0.047 N/A N/A % Reduction in Area: -1419.40% N/A N/A % Reduction in Volume: -1466.70% N/A N/A Classification: Full Thickness Without Exposed N/A N/A Support Structures Exudate Amount: Medium N/A N/A Exudate Type: Serosanguineous N/A N/A Exudate Color: red, brown N/A N/A Granulation Amount: Small (1-33%) N/A N/A Granulation Quality: Red N/A N/A Necrotic Amount: Large (67-100%) N/A N/A Exposed Structures: Fat Layer (Subcutaneous Tissue): N/A N/A Yes Fascia: No Tendon: No Muscle: No Joint: No Bone: No Epithelialization: None N/A N/A Treatment Notes Electronic Signature(s) Signed: 08/30/2020 3:02:05 PM By: Phillis Haggis, Dondra Prader RN Entered By: Phillis Haggis, Dondra Prader on 08/30/2020 13:09:11 Burgen, [name] ([id number]) -------------------------------------------------------------------------------- Multi-Disciplinary Care Plan Details Patient Name: MIRELLA, GUEYE. Date of Service: 08/30/2020 12:30 PM Medical Record Number: [id number] Patient Account Number: [id number] Date of Birth/Sex: [birth year] (35 y.o. F) Treating RN: Rogers Blocker Primary Care Lawren Sexson: Lenor Coffin Other Clinician: Referring Crit Obremski: Lenor Coffin Treating Tip Atienza/Extender: Rowan Blase in Treatment: 5 Active Inactive Wound/Skin Impairment Nursing Diagnoses: Impaired tissue integrity Goals: Patient/caregiver will verbalize understanding of skin care  regimen Date Initiated: 07/26/2020 Target Resolution Date: 07/26/2020 Goal Status: Active Ulcer/skin breakdown will have a volume reduction of 30% by week 4 Date Initiated: 07/26/2020 Target Resolution Date: 08/25/2020 Goal Status: Active Ulcer/skin breakdown will have a volume reduction of 50% by week 8 Date Initiated: 07/26/2020 Target Resolution Date: 09/25/2020 Goal Status: Active Ulcer/skin breakdown will have a volume reduction of 80% by week 12 Date Initiated: 07/26/2020 Target Resolution Date: 10/25/2020 Goal Status: Active Ulcer/skin breakdown will heal within 14 weeks Date Initiated: 07/26/2020 Target Resolution Date: 11/25/2020 Goal Status: Active Interventions: Assess patient/caregiver ability to obtain necessary supplies Assess patient/caregiver ability to perform ulcer/skin care regimen upon admission and as needed Assess ulceration(s) every visit  Provide education on ulcer and skin care Treatment Activities: Referred to DME Ambert Virrueta for dressing supplies : 07/26/2020 Skin care regimen initiated : 07/26/2020 Notes: Electronic Signature(s) Signed: 08/30/2020 3:02:05 PM By: Phillis Haggis, Dondra Prader RN Entered By: Phillis Haggis, Kenia on 08/30/2020 13:09:04 Lentsch, [name] ([id number]) -------------------------------------------------------------------------------- Pain Assessment Details Patient Name: [name], [name]. Date of Service: 08/30/2020 12:30 PM Medical Record Number: [id number] Patient Account Number: [id number] Date of Birth/Sex: [birth year] (35 y.o. F) Treating RN: Yevonne Pax Primary Care Crispin Vogel: Lenor Coffin Other Clinician: Referring Audree Schrecengost: Lenor Coffin Treating Keirstyn Aydt/Extender: Rowan Blase in Treatment: 5 Active Problems Location of Pain Severity and Description of Pain Patient Has Paino No Site Locations Pain Management and Medication Current Pain Management: Electronic Signature(s) Signed: 08/30/2020 5:43:53 PM By: Yevonne Pax  RN Entered By: Yevonne Pax on 08/30/2020 12:49:57 Radovich, [name] ([id number]) -------------------------------------------------------------------------------- Patient/Caregiver Education Details Patient Name: WANA, MOUNT. Date of Service: 08/30/2020 12:30 PM Medical Record Number: [id number] Patient Account Number: [id number] Date of Birth/Gender: [birth year] (35 y.o. F) Treating RN: Rogers Blocker Primary Care Physician: Lenor Coffin Other Clinician: Referring Physician: Lenor Coffin Treating Physician/Extender: Rowan Blase in Treatment: 5 Education Assessment Education Provided To: Patient Education Topics Provided Wound/Skin Impairment: Methods: Explain/Verbal Responses: State content correctly Electronic Signature(s) Signed: 08/30/2020 3:02:05 PM By: Phillis Haggis, Dondra Prader RN Entered By: Phillis Haggis, Dondra Prader on 08/30/2020 13:14:45 Iribe, [name] ([id number]) -------------------------------------------------------------------------------- Wound Assessment Details Patient Name: JARISSA, SHERIFF. Date of Service: 08/30/2020 12:30 PM Medical Record Number: [id number] Patient Account Number: [id number] Date of Birth/Sex: [birth year] (35 y.o. F) Treating RN: Yevonne Pax Primary Care Valta Dillon: Lenor Coffin Other Clinician: Referring Graycen Sadlon: Lenor Coffin Treating Virgina Deakins/Extender: Rowan Blase in Treatment: 5 Wound Status Wound Number: 3 Primary Etiology: Venous Leg Ulcer Wound Location: Left, Distal, Medial Lower Leg Secondary Etiology: Lymphedema Wounding Event: Gradually Appeared Wound Status: Open Date Acquired: 07/18/2020 Comorbid History: Hypertension, Peripheral Venous Disease Weeks Of Treatment: 5 Clustered Wound: No Photos Wound Measurements Length: (cm) 1 Width: (cm) 0.6 Depth: (cm) 0.1 Area: (cm) 0.471 Volume: (cm) 0.047 % Reduction in Area: -1419.4% % Reduction in Volume: -1466.7% Epithelialization: None Tunneling:  No Undermining: No Wound Description Classification: Full Thickness Without Exposed Support Structures Exudate Amount: Medium Exudate Type: Serosanguineous Exudate Color: red, brown Foul Odor After Cleansing: No Slough/Fibrino Yes Wound Bed Granulation Amount: Small (1-33%) Exposed Structure Granulation Quality: Red Fascia Exposed: No Necrotic Amount: Large (67-100%) Fat Layer (Subcutaneous Tissue) Exposed: Yes Necrotic Quality: Adherent Slough Tendon Exposed: No Muscle Exposed: No Joint Exposed: No Bone Exposed: No Treatment Notes Wound #3 (Lower Leg) Wound Laterality: Left, Medial, Distal Cleanser Soap and Water Discharge Instruction: Gently cleanse wound with antibacterial soap, rinse and pat dry prior to dressing wounds Peri-Wound Care LARYAH, NEUSER ([id number]) Topical Primary Dressing Iodosorb 40 (g) Discharge Instruction: Apply IodoSorb to wound bed only as directed. Secondary Dressing ABD Pad 5x9 (in/in) Discharge Instruction: Cover with ABD pad Secured With 53M ACE Elastic Bandage With VELCRO Brand Closure, 4 (in) Discharge Instruction: Wrap from ankle to calf-patient to adjust if ACE wrap falls Compression Wrap Compression Stockings Add-Ons Electronic Signature(s) Signed: 08/30/2020 5:43:53 PM By: Yevonne Pax RN Entered By: Yevonne Pax on 08/30/2020 12:54:49 Matulich, [name] ([id number]) -------------------------------------------------------------------------------- Vitals Details Patient Name: [name] Date of Service: 08/30/2020 12:30 PM Medical Record Number: [id number] Patient Account Number: [id number] Date of Birth/Sex: 1985/06/17 (35 y.o. F) Treating RN: Yevonne Pax Primary Care Jaquae Rieves: Lenor Coffin Other Clinician: Referring Rainier Feuerborn: Lenor Coffin Treating Lavon Horn/Extender: Allen Derry  Weeks in Treatment: 5 Vital Signs Time Taken: 12:49 Temperature (F): 98.3 Height (in): 66 Pulse (bpm): 81 Weight (lbs): 388 Respiratory  Rate (breaths/min): 16 Body Mass Index (BMI): 62.6 Blood Pressure (mmHg): 119/80 Reference Range: 80 - 120 mg / dl Electronic Signature(s) Signed: 08/30/2020 5:43:53 PM By: Yevonne Pax RN Entered By: Yevonne Pax on 08/30/2020 12:49:51

## 2020-09-15 ENCOUNTER — Ambulatory Visit: Payer: BC Managed Care – PPO | Admitting: Physician Assistant

## 2020-09-19 ENCOUNTER — Encounter: Payer: BC Managed Care – PPO | Attending: Physician Assistant | Admitting: Physician Assistant

## 2020-09-19 ENCOUNTER — Other Ambulatory Visit: Payer: Self-pay

## 2020-09-19 DIAGNOSIS — L98492 Non-pressure chronic ulcer of skin of other sites with fat layer exposed: Secondary | ICD-10-CM | POA: Insufficient documentation

## 2020-09-19 NOTE — Progress Notes (Addendum)
[name], [name] ([id number]) Visit Report for 09/19/2020 Chief Complaint Document Details Patient Name: [name], [name]. Date of Service: 09/19/2020 1:45 PM Medical Record Number: [id number] Patient Account Number: [id number] Date of Birth/Sex: 1986-01-12 (35 y.o. F) Treating RN: Hansel Feinstein Primary Care Provider: Lenor Coffin Other Clinician: Referring Provider: Lenor Coffin Treating Provider/Extender: Rowan Blase in Treatment: 7 Information Obtained from: Patient Chief Complaint Left LE Ulcer Electronic Signature(s) Signed: 09/19/2020 2:04:58 PM By: Lenda Kelp PA-C Entered By: Lenda Kelp on 09/19/2020 14:04:58 [name], [name] ([id number]) -------------------------------------------------------------------------------- HPI Details Patient Name: [name], [name] Date of Service: 09/19/2020 1:45 PM Medical Record Number: [id number] Patient Account Number: [id number] Date of Birth/Sex: [birth year] (35 y.o. F) Treating RN: Hansel Feinstein Primary Care Provider: Lenor Coffin Other Clinician: Referring Provider: Lenor Coffin Treating Provider/Extender: Rowan Blase in Treatment: 7 History of Present Illness HPI Description: 35 year old patient who started with having ulcerations on the right lower leg on the lateral part of her ankle for about 2 weeks. She was seen in the ER at [facility] and advised to see the wound care for a consultation. No X-rays of workup was done during the ER visit and no prescription for any medications of compression wraps were given. the patient is not diabetic but does have hypertension and her medications have been reviewed by me. 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 The right and left greater saphenous vein was found to be tortuous. Deep venous system was also not  competent and there was reflux of more than 500 ms. She was then seen by Dr. Tawanna Cooler Early who recommended that the patient would not benefit from endovenous ablation and he had recommended vein stripping on the right side and multiple small phlebectomy procedures on the left side. the patient did not follow-up due to social economic reasons. She has not been wearing any compression stockings and has not taken any specific treatment for varicose veins for the last 3 years. 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 We have obtained some appointments to see the vascular surgeons by the end of the month and the patient would like to follow up with me at my [facility] on Wednesday, June 29. 10/14/2014 -- she could not see me yesterday in [place] and hence has come for a review today. She has a vascular workup to be done this afternoon at [facility]. She is doing fine otherwise. 10/22/2014 -- she was seen by Dr. [name] and he has recommended surgical removal of her right saphenous vein from distal thigh to saphenofemoral junction and stab phlebectomy's of multiple large tributary branches throughout her thigh and calf. This would be done under general anesthesia in the outpatient setting. 10/29/2014 -- she is trying to work on a surgical date and in the meanwhile we have got insurance clearance for Apligraf and we will start this next week. 7/22 2016 -- she is here for the first application of Apligraf. 11/19/2014 -- she is here for a second application of Apligraf 11/26/2014 -- she has done fine after her last application of Apligraf and is awaiting her surgery which is scheduled for August 31. 12/03/2014 -- she is doing fine and is here for her third application  of Apligraf. 12/21/2014 -- She had surgery on 12/15/2014 by Dr.Early who did #1 ligation and stripping of right great saphenous  vein from distal thigh to saphenofemoral junction, #2 stab phlebectomy of large tributary varicose veins in the thigh popliteal space and calf. She had an Ace wrap up to her groin and this was removed today and the Unna's boot was also removed. 12/28/2014 -- she is here for her fourth application of Apligraf. 01/06/2015 - he saw her vascular surgeon Dr. Arbie Cookey who was pleased with her progress and he has confirmed that no surgical procedures could be attempted on the left side. 01/13/2015 -- her wound looks very good and she's been having no problems whatsoever. Readmission: 07/26/2020 upon evaluation today patient presents for initial inspection here in our clinic for a new issue with her left leg although she is previously been seen due to issues with the right leg back in 2016. At that time she was seeing Dr. Arbie Cookey who is a vein/vascular specialist in [place]. He has since semiretired from what I understand. He is working out of [employer] I believe. Nonetheless she tells me at the time that there was really nothing to do for her left leg although the right leg was where they did most of the work. Subsequently she states that she is done fairly well until just in the past week where she had issues with bleeding from what appears to be varicose vein on the left leg medially. Unfortunately this has continued to be an issue although she tells me at first it was coming much more significantly Down quite a bit but nonetheless has not completely resolved. Every time she showers she notices that it starts to drain a little bit more. She does have a history of chronic venous insufficiency, lymphedema, varicose veins bilaterally, and obesity. 08/02/2020 upon evaluation today patient appears to be doing about the same in regards to the ulcer on her left leg. She has some eschar covering there is definitely some fluid collecting underneath unfortunately. With that being said she tells me she is still having  a tremendous amount of pain therefore she is really not able to allow me to clean this off very effectively to be perfectly honest. I think we need to try to soften this up 08/16/2020 upon evaluation today patient's wound is really not doing significantly better not really states about the same. She notes that the wrap just does not seem to be staying up very well at all unfortunately. No fevers, chills, nausea, vomiting, or diarrhea. She did cut it off once it starts to slide in order to alleviate some of the pressure from sliding Down. Fortunately there is no signs of active infection at this time which is great news. 08/23/2020 upon evaluation today patient appears to be doing well 08/23/2020 upon evaluation today patient appears to be doing well with regard to her wound all things considered. Fortunately there does not appear to be any signs of active infection at this time which is great news. She has been tolerating the dressing changes without complication and overall I am extremely pleased with where things stand at this point. She does have her appointment with vascular in [facility] on June 9. [name], [name] ([id number]) 08/30/2020 upon evaluation today patient actually appears to be doing decently well in regard to her wound. Fortunately there is no signs of active infection which is great news. Nonetheless I do believe that the patient is going require little bit of debridement if  she is okay with me attempting that today I think that will help clean off some of the necrotic tissue. Fortunately there does not appear to be otherwise any evidence of active infection which is also great news. 09/19/2020 upon evaluation today patient appears to be doing a little better in regard to her wound as compared to previous. Fortunately there does not appear to be any signs of active infection overall. No fever chills noted. I do believe that the Iodosorb has made this a little bit better with regard to  the overall size and appearance of the wound bed though again she does still have quite a ways to go to get this to heal she still very tender to touch. Electronic Signature(s) Signed: 09/19/2020 2:40:18 PM By: Lenda Kelp PA-C Entered By: Lenda Kelp on 09/19/2020 14:40:18 Durnell, [name] ([id number]) -------------------------------------------------------------------------------- Physical Exam Details Patient Name: [name], [name]. Date of Service: 09/19/2020 1:45 PM Medical Record Number: [id number] Patient Account Number: [id number] Date of Birth/Sex: 01-16-1986 (35 y.o. F) Treating RN: Hansel Feinstein Primary Care Provider: Lenor Coffin Other Clinician: Referring Provider: Lenor Coffin Treating Provider/Extender: Rowan Blase in Treatment: 7 Constitutional Obese and well-hydrated in no acute distress. Respiratory normal breathing without difficulty. Psychiatric this patient is able to make decisions and demonstrates good insight into disease process. Alert and Oriented x 3. pleasant and cooperative. Notes Patient had a lot of the Iodosorb stuck around the edges of the wound which is making it look like it was measuring bigger than it really was. I was able to get some of this off today. She tolerated that without complication although she did have some discomfort. Electronic Signature(s) Signed: 09/19/2020 2:40:34 PM By: Lenda Kelp PA-C Entered By: Lenda Kelp on 09/19/2020 14:40:34 Lieser, [name] ([id number]) -------------------------------------------------------------------------------- Physician Orders Details Patient Name: [name], [name] Date of Service: 09/19/2020 1:45 PM Medical Record Number: [id number] Patient Account Number: [id number] Date of Birth/Sex: [birth year] (35 y.o. F) Treating RN: Hansel Feinstein Primary Care Provider: Lenor Coffin Other Clinician: Referring Provider: Lenor Coffin Treating Provider/Extender: Rowan Blase in Treatment:  7 Verbal / Phone Orders: No Diagnosis Coding ICD-10 Coding Code Description I87.2 Venous insufficiency (chronic) (peripheral) I89.0 Lymphedema, not elsewhere classified I83.893 Varicose veins of bilateral lower extremities with other complications L98.492 Non-pressure chronic ulcer of skin of other sites with fat layer exposed E66.01 Morbid (severe) obesity due to excess calories Follow-up Appointments o Return Appointment in 1 week. Bathing/ Shower/ Hygiene o Wash wounds with antibacterial soap and water. Edema Control - Lymphedema / Segmental Compressive Device / Other Left Lower Extremity o Elevate, Exercise Daily and Avoid Standing for Long Periods of Time. o Elevate legs to the level of the heart and pump ankles as often as possible o Elevate leg(s) parallel to the floor when sitting. Wound Treatment Wound #3 - Lower Leg Wound Laterality: Left, Medial, Distal Cleanser: Soap and Water 3 x Per Week/15 Days Discharge Instructions: Gently cleanse wound with antibacterial soap, rinse and pat dry prior to dressing wounds Primary Dressing: Iodosorb 40 (g) (Generic) 3 x Per Week/15 Days Discharge Instructions: Apply IodoSorb to wound bed only as directed. Secondary Dressing: ABD Pad 5x9 (in/in) (Generic) 3 x Per Week/15 Days Discharge Instructions: Cover with ABD pad Secured With: 21M ACE Elastic Bandage With VELCRO Brand Closure, 4 (in) (Generic) 3 x Per Week/15 Days Discharge Instructions: Wrap from ankle to calf-patient to adjust if ACE wrap falls Electronic Signature(s) Signed: 09/19/2020 3:39:51 PM By: Hansel Feinstein  Signed: 09/19/2020 4:00:10 PM By: Lenda Kelp PA-C Entered By: Hansel Feinstein on 09/19/2020 14:39:48 Nayak, [name] ([id number]) -------------------------------------------------------------------------------- Problem List Details Patient Name: [name], [name]. Date of Service: 09/19/2020 1:45 PM Medical Record Number: [id number] Patient Account Number:  [id number] Date of Birth/Sex: 30-Sep-1985 (35 y.o. F) Treating RN: Hansel Feinstein Primary Care Provider: Lenor Coffin Other Clinician: Referring Provider: Lenor Coffin Treating Provider/Extender: Rowan Blase in Treatment: 7 Active Problems ICD-10 Encounter Code Description Active Date MDM Diagnosis I87.2 Venous insufficiency (chronic) (peripheral) 07/26/2020 No Yes I89.0 Lymphedema, not elsewhere classified 07/26/2020 No Yes I83.893 Varicose veins of bilateral lower extremities with other complications 07/26/2020 No Yes L98.492 Non-pressure chronic ulcer of skin of other sites with fat layer exposed 07/26/2020 No Yes E66.01 Morbid (severe) obesity due to excess calories 07/26/2020 No Yes Inactive Problems Resolved Problems Electronic Signature(s) Signed: 09/19/2020 2:04:52 PM By: Lenda Kelp PA-C Entered By: Lenda Kelp on 09/19/2020 14:04:51 Neyens, [name] ([id number]) -------------------------------------------------------------------------------- Progress Note Details Patient Name: [name] Date of Service: 09/19/2020 1:45 PM Medical Record Number: [id number] Patient Account Number: [id number] Date of Birth/Sex: 1986/02/10 (35 y.o. F) Treating RN: Hansel Feinstein Primary Care Provider: Lenor Coffin Other Clinician: Referring Provider: Lenor Coffin Treating Provider/Extender: Rowan Blase in Treatment: 7 Subjective Chief Complaint Information obtained from Patient Left LE Ulcer History of Present Illness (HPI) 35 year old patient who started with having ulcerations on the right lower leg on the lateral part of her ankle for about 2 weeks. She was seen in the ER at [facility] and advised to see the wound care for a consultation. No X-rays of workup was done during the ER visit and no prescription for any medications of compression wraps were given. the patient is not diabetic but does have hypertension and her medications have been reviewed by me. 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 The right and left greater saphenous vein was found to be tortuous. Deep venous system was also not competent and there was reflux of more than 500 ms. She was then seen by Dr. Tawanna Cooler Early who recommended that the patient would not benefit from endovenous ablation and he had recommended vein stripping on the right side and multiple small phlebectomy procedures on the left side. the patient did not follow-up due to social economic reasons. She has not been wearing any compression stockings and has not taken any specific treatment for varicose veins for the last 3 years. 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 We have obtained some appointments to see the vascular surgeons by the end of the month and the patient would like to follow up with me at my [facility] on Wednesday, June 29. 10/14/2014 -- she could not see me yesterday in [place] and hence has come for a review today. She has a vascular workup to be done this afternoon at [facility]. She is doing fine otherwise. 10/22/2014 -- she was seen by Dr. [name] and he has recommended surgical removal of her right saphenous vein from distal thigh to saphenofemoral junction and stab phlebectomy's of multiple large tributary branches throughout her thigh and calf. This would be done under general anesthesia in the outpatient setting. 10/29/2014 -- she is  trying to work on a surgical date and in the meanwhile we have got insurance clearance for Apligraf and we will start this next week. 7/22 2016 -- she is here for the first application of Apligraf. 11/19/2014 -- she is here for a second application of Apligraf 11/26/2014 -- she has done fine  after her last application of Apligraf and is awaiting her surgery which is scheduled for August 31. 12/03/2014 -- she is doing fine and is here for her third application of Apligraf. 12/21/2014 -- She had surgery on 12/15/2014 by Dr.Early who did #1 ligation and stripping of right great saphenous vein from distal thigh to saphenofemoral junction, #2 stab phlebectomy of large tributary varicose veins in the thigh popliteal space and calf. She had an Ace wrap up to her groin and this was removed today and the Unna's boot was also removed. 12/28/2014 -- she is here for her fourth application of Apligraf. 01/06/2015 - he saw her vascular surgeon Dr. Arbie Cookey who was pleased with her progress and he has confirmed that no surgical procedures could be attempted on the left side. 01/13/2015 -- her wound looks very good and she's been having no problems whatsoever. Readmission: 07/26/2020 upon evaluation today patient presents for initial inspection here in our clinic for a new issue with her left leg although she is previously been seen due to issues with the right leg back in 2016. At that time she was seeing Dr. Arbie Cookey who is a vein/vascular specialist in [place]. He has since semiretired from what I understand. He is working out of [employer] I believe. Nonetheless she tells me at the time that there was really nothing to do for her left leg although the right leg was where they did most of the work. Subsequently she states that she is done fairly well until just in the past week where she had issues with bleeding from what appears to be varicose vein on the left leg medially. Unfortunately this has continued to be an issue although she tells me at first it was coming much more significantly Down quite a bit but nonetheless has not completely resolved. Every time she showers she notices that it starts to drain a little bit more. She does have a history of chronic venous insufficiency, lymphedema, varicose  veins bilaterally, and obesity. 08/02/2020 upon evaluation today patient appears to be doing about the same in regards to the ulcer on her left leg. She has some eschar covering there is definitely some fluid collecting underneath unfortunately. With that being said she tells me she is still having a tremendous amount of pain therefore she is really not able to allow me to clean this off very effectively to be perfectly honest. I think we need to try to soften this up 08/16/2020 upon evaluation today patient's wound is really not doing significantly better not really states about the same. She notes that the wrap just does not seem to be staying up very well at all unfortunately. No fevers, chills, nausea, vomiting, or diarrhea. She did cut it off once it starts to slide in order to alleviate some of the pressure from sliding Down. Fortunately there is no signs of active infection at this time which is great news. [name], [name] ([id number]) 08/23/2020 upon evaluation today patient appears to be doing well 08/23/2020 upon evaluation today patient appears to be doing well with regard to her wound all things considered. Fortunately there does not appear to be any signs of active infection at  this time which is great news. She has been tolerating the dressing changes without complication and overall I am extremely pleased with where things stand at this point. She does have her appointment with vascular in [facility] on June 9. 08/30/2020 upon evaluation today patient actually appears to be doing decently well in regard to her wound. Fortunately there is no signs of active infection which is great news. Nonetheless I do believe that the patient is going require little bit of debridement if she is okay with me attempting that today I think that will help clean off some of the necrotic tissue. Fortunately there does not appear to be otherwise any evidence of active infection which is also great  news. 09/19/2020 upon evaluation today patient appears to be doing a little better in regard to her wound as compared to previous. Fortunately there does not appear to be any signs of active infection overall. No fever chills noted. I do believe that the Iodosorb has made this a little bit better with regard to the overall size and appearance of the wound bed though again she does still have quite a ways to go to get this to heal she still very tender to touch. Objective Constitutional Obese and well-hydrated in no acute distress. Vitals Time Taken: 2:10 PM, Height: 66 in, Weight: 388 lbs, BMI: 62.6, Temperature: 98.3 F, Pulse: 95 bpm, Respiratory Rate: 16 breaths/min, Blood Pressure: 113/80 mmHg. Respiratory normal breathing without difficulty. Psychiatric this patient is able to make decisions and demonstrates good insight into disease process. Alert and Oriented x 3. pleasant and cooperative. General Notes: Patient had a lot of the Iodosorb stuck around the edges of the wound which is making it look like it was measuring bigger than it really was. I was able to get some of this off today. She tolerated that without complication although she did have some discomfort. Integumentary (Hair, Skin) Wound #3 status is Open. Original cause of wound was Gradually Appeared. The date acquired was: 07/18/2020. The wound has been in treatment 7 weeks. The wound is located on the Left,Distal,Medial Lower Leg. The wound measures 0.5cm length x 0.8cm width x 0.2cm depth; 0.314cm^2 area and 0.063cm^3 volume. There is Fat Layer (Subcutaneous Tissue) exposed. There is a medium amount of serosanguineous drainage noted. The wound margin is flat and intact. There is small (1-33%) red granulation within the wound bed. There is a large (67-100%) amount of necrotic tissue within the wound bed. Assessment Active Problems ICD-10 Venous insufficiency (chronic) (peripheral) Lymphedema, not elsewhere  classified Varicose veins of bilateral lower extremities with other complications Non-pressure chronic ulcer of skin of other sites with fat layer exposed Morbid (severe) obesity due to excess calories Plan Follow-up Appointments: Return Appointment in 1 week. Bathing/ Shower/ Hygiene: Wash wounds with antibacterial soap and water. Edema Control - Lymphedema / Segmental Compressive Device / OtherSOLARA, [name] ([id number]) Elevate, Exercise Daily and Avoid Standing for Long Periods of Time. Elevate legs to the level of the heart and pump ankles as often as possible Elevate leg(s) parallel to the floor when sitting. WOUND #3: - Lower Leg Wound Laterality: Left, Medial, Distal Cleanser: Soap and Water 3 x Per Week/15 Days Discharge Instructions: Gently cleanse wound with antibacterial soap, rinse and pat dry prior to dressing wounds Primary Dressing: Iodosorb 40 (g) (Generic) 3 x Per Week/15 Days Discharge Instructions: Apply IodoSorb to wound bed only as directed. Secondary Dressing: ABD Pad 5x9 (in/in) (Generic) 3 x Per Week/15 Days Discharge Instructions: Cover  with ABD pad Secured With: 11M ACE Elastic Bandage With VELCRO Brand Closure, 4 (in) (Generic) 3 x Per Week/15 Days Discharge Instructions: Wrap from ankle to calf-patient to adjust if ACE wrap falls 1. Would recommend currently that we continue with Iodosorb since that seems to be doing about as well as anything in my opinion. 2. I am also can recommend at this time that we have the patient continue to monitor for any signs of worsening infection. Obviously the biggest issue I see is that we need to make sure that this is both not infected as well as showing signs of improvement I think potentially vascular may be able to help with that. 3. Patient does see vein and vascular this week on Thursday and subsequently she will be having both studies as well as seeing the physician we will see him in the office today as well. We  will see patient back for reevaluation in 1 week here in the clinic. If anything worsens or changes patient will contact our office for additional recommendations. Electronic Signature(s) Signed: 09/19/2020 2:41:25 PM By: Lenda KelpStone III, Marvie Brevik PA-C Entered By: Lenda KelpStone III, Darielys Giglia on 09/19/2020 14:41:25 Lesesne, [name] ([id number]) -------------------------------------------------------------------------------- SuperBill Details Patient Name: [name] SimmondsSUMMERS, Aela J. Date of Service: 09/19/2020 Medical Record Number: [id number] Patient Account Number: [id number] Date of Birth/Sex: 12/10/1985 (35 y.o. 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 E66.01 Morbid (severe) obesity due to excess calories Facility Procedures CPT4 Code: [id number] Description: [id number] - WOUND CARE VISIT-LEV 2 EST PT Modifier: Quantity: 1 Physician Procedures CPT4 Code: [id number] Description: 99213 - WC PHYS LEVEL 3 - EST PT Modifier: Quantity: 1 CPT4 Code: Description: ICD-10 Diagnosis Description I87.2 Venous insufficiency (chronic) (peripheral) I89.0 Lymphedema, not elsewhere classified 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

## 2020-09-19 NOTE — Progress Notes (Addendum)
[name] ([id number]) Visit Report for 09/19/2020 Arrival Information Details Patient Name: [name], [name]. Date of Service: 09/19/2020 1:45 PM Medical Record Number: [id number] Patient Account Number: [id number] Date of Birth/Sex: 09-Mar-1986 (35 y.o. F) Treating RN: Cornell Barman Primary Care Coden Franchi: Addison Naegeli Other Clinician: Referring Demiyah Fischbach: Addison Naegeli Treating Khristine Verno/Extender: Skipper Cliche in Treatment: 7 Visit Information History Since Last Visit Pain Present Now: No Patient Arrived: Ambulatory Arrival Time: 14:09 Accompanied By: self Transfer Assistance: None Patient Identification Verified: Yes Secondary Verification Process Completed: Yes Patient Requires Transmission-Based Precautions: No Patient Has Alerts: No Electronic Signature(s) Signed: 09/20/2020 4:30:32 PM By: Gretta Cool, [name], RN, CWS, [name], [name] Entered By: Gretta Cool, [name], RN, CWS, Kim on 09/19/2020 14:10:05 [name] ([id number]) -------------------------------------------------------------------------------- Clinic Level of Care Assessment Details Patient Name: [name], [name]. Date of Service: 09/19/2020 1:45 PM Medical Record Number: [id number] Patient Account Number: [id number] Date of Birth/Sex: [birth year] (35 y.o. F) Treating RN: Donnamarie Poag Primary Care Mehlani Blankenburg: Addison Naegeli Other Clinician: Referring Ceyda Peterka: Addison Naegeli Treating Lido Maske/Extender: Skipper Cliche in Treatment: 7 Clinic Level of Care Assessment Items TOOL 4 Quantity Score []  - Use when only an EandM is performed on FOLLOW-UP visit 0 ASSESSMENTS - Nursing Assessment / Reassessment []  - Reassessment of Co-morbidities (includes updates in patient status) 0 []  - 0 Reassessment of Adherence to Treatment Plan ASSESSMENTS - Wound and Skin Assessment / Reassessment X - Simple Wound Assessment / Reassessment - one wound 1 5 []  - 0 Complex Wound Assessment / Reassessment - multiple wounds []  - 0 Dermatologic /  Skin Assessment (not related to wound area) ASSESSMENTS - Focused Assessment []  - Circumferential Edema Measurements - multi extremities 0 []  - 0 Nutritional Assessment / Counseling / Intervention []  - 0 Lower Extremity Assessment (monofilament, tuning fork, pulses) []  - 0 Peripheral Arterial Disease Assessment (using hand held doppler) ASSESSMENTS - Ostomy and/or Continence Assessment and Care []  - Incontinence Assessment and Management 0 []  - 0 Ostomy Care Assessment and Management (repouching, etc.) PROCESS - Coordination of Care X - Simple Patient / Family Education for ongoing care 1 15 []  - 0 Complex (extensive) Patient / Family Education for ongoing care []  - 0 Staff obtains Programmer, systems, Records, Test Results / Process Orders []  - 0 Staff telephones HHA, Nursing Homes / Clarify orders / etc []  - 0 Routine Transfer to another Facility (non-emergent condition) []  - 0 Routine Hospital Admission (non-emergent condition) []  - 0 New Admissions / Biomedical engineer / Ordering NPWT, Apligraf, etc. []  - 0 Emergency Hospital Admission (emergent condition) X- 1 10 Simple Discharge Coordination []  - 0 Complex (extensive) Discharge Coordination PROCESS - Special Needs []  - Pediatric / Minor Patient Management 0 []  - 0 Isolation Patient Management []  - 0 Hearing / Language / Visual special needs []  - 0 Assessment of Community assistance (transportation, D/C planning, etc.) []  - 0 Additional assistance / Altered mentation []  - 0 Support Surface(s) Assessment (bed, cushion, seat, etc.) INTERVENTIONS - Wound Cleansing / Measurement Poinsett, Nickisha J. ([id number]) X- 1 5 Simple Wound Cleansing - one wound []  - 0 Complex Wound Cleansing - multiple wounds []  - 0 Wound Imaging (photographs - any number of wounds) []  - 0 Wound Tracing (instead of photographs) X- 1 5 Simple Wound Measurement - one wound []  - 0 Complex Wound Measurement - multiple wounds INTERVENTIONS -  Wound Dressings X - Small Wound Dressing one or multiple wounds 1 10 []  - 0 Medium Wound Dressing one or multiple wounds []  -  0 Large Wound Dressing one or multiple wounds []  - 0 Application of Medications - topical []  - 0 Application of Medications - injection INTERVENTIONS - Miscellaneous []  - External ear exam 0 []  - 0 Specimen Collection (cultures, biopsies, blood, body fluids, etc.) []  - 0 Specimen(s) / Culture(s) sent or taken to Lab for analysis []  - 0 Patient Transfer (multiple staff / Civil Service fast streamer / Similar devices) []  - 0 Simple Staple / Suture removal (25 or less) []  - 0 Complex Staple / Suture removal (26 or more) []  - 0 Hypo / Hyperglycemic Management (close monitor of Blood Glucose) []  - 0 Ankle / Brachial Index (ABI) - do not check if billed separately X- 1 5 Vital Signs Has the patient been seen at the hospital within the last three years: Yes Total Score: 55 Level Of Care: New/Established - Level 2 Electronic Signature(s) Signed: 09/19/2020 3:39:51 PM By: Donnamarie Poag Entered By: Donnamarie Poag on 09/19/2020 14:39:03 [name] ([id number]) -------------------------------------------------------------------------------- Encounter Discharge Information Details Patient Name: [name]. Date of Service: 09/19/2020 1:45 PM Medical Record Number: [id number] Patient Account Number: [id number] Date of Birth/Sex: [birth year] (35 y.o. F) Treating RN: Donnamarie Poag Primary Care [place] Rico: Addison Naegeli Other Clinician: Referring Kaylen Nghiem: Addison Naegeli Treating Goerge Mohr/Extender: Skipper Cliche in Treatment: 7 Encounter Discharge Information Items Discharge Condition: Stable Ambulatory Status: Ambulatory Discharge Destination: Home Transportation: Private Auto Accompanied By: self Schedule Follow-up Appointment: Yes Clinical Summary of Care: Electronic Signature(s) Signed: 09/19/2020 3:36:07 PM By: Jeanine Luz Entered By: Jeanine Luz on 09/19/2020  14:51:43 [name] ([id number]) -------------------------------------------------------------------------------- Lower Extremity Assessment Details Patient Name: ANAILY, [name] Date of Service: 09/19/2020 1:45 PM Medical Record Number: [id number] Patient Account Number: [id number] Date of Birth/Sex: [birth year] (34 y.o. F) Treating RN: Cornell Barman Primary Care Lashundra Shiveley: Addison Naegeli Other Clinician: Referring Bacilio Abascal: Addison Naegeli Treating Latroya Ng/Extender: Jeri Cos Weeks in Treatment: 7 Edema Assessment Assessed: [Left: No] [Right: No] [Left: Edema] [Right: :] Calf Left: Right: Point of Measurement: 31 cm From Medial Instep 65 cm Ankle Left: Right: Point of Measurement: 9 cm From Medial Instep 32 cm Vascular Assessment Pulses: Dorsalis Pedis Palpable: [Left:Yes] Posterior Tibial Palpable: [Left:Yes] Electronic Signature(s) Signed: 09/20/2020 4:30:32 PM By: Gretta Cool, [name], RN, CWS, [name], [name] Entered By: Gretta Cool, [name], RN, CWS, Kim on 09/19/2020 14:19:30 Hippert, Herminio [name] ([id number]) -------------------------------------------------------------------------------- Multi Wound Chart Details Patient Name: [name], [name]. Date of Service: 09/19/2020 1:45 PM Medical Record Number: [id number] Patient Account Number: [id number] Date of Birth/Sex: [birth year] (34 y.o. F) Treating RN: Donnamarie Poag Primary Care Kysen Wetherington: Addison Naegeli Other Clinician: Referring Ellysia Char: Addison Naegeli Treating Allah Reason/Extender: Skipper Cliche in Treatment: 7 Vital Signs Height(in): 66 Pulse(bpm): 95 Weight(lbs): 388 Blood Pressure(mmHg): 113/80 Body Mass Index(BMI): 63 Temperature(F): 98.3 Respiratory Rate(breaths/min): 16 Photos: [N/A:N/A] Wound Location: Left, Distal, Medial Lower Leg N/A N/A Wounding Event: Gradually Appeared N/A N/A Primary Etiology: Venous Leg Ulcer N/A N/A Secondary Etiology: Lymphedema N/A N/A Comorbid History: Hypertension, Peripheral Venous N/A  N/A Disease Date Acquired: 07/18/2020 N/A N/A Weeks of Treatment: 7 N/A N/A Wound Status: Open N/A N/A Measurements L x W x D (cm) 1x1.5x0.2 N/A N/A Area (cm) : 1.178 N/A N/A Volume (cm) : 0.236 N/A N/A % Reduction in Area: -3700.00% N/A N/A % Reduction in Volume: -7766.70% N/A N/A Classification: Full Thickness Without Exposed N/A N/A Support Structures Exudate Amount: Medium N/A N/A Exudate Type: Serosanguineous N/A N/A Exudate Color: red, brown N/A N/A Wound Margin: Flat and Intact N/A N/A Granulation Amount: Small (1-33%)  N/A N/A Granulation Quality: Red N/A N/A Necrotic Amount: Large (67-100%) N/A N/A Exposed Structures: Fat Layer (Subcutaneous Tissue): N/A N/A Yes Fascia: No Tendon: No Muscle: No Joint: No Bone: No Epithelialization: None N/A N/A Treatment Notes Electronic Signature(s) Signed: 09/19/2020 3:39:51 PM By: Donnamarie Poag Entered By: Donnamarie Poag on 09/19/2020 14:34:56 [name], Herminio [name] ([id number]) -------------------------------------------------------------------------------- [place] Details Patient Name: [name], [name]. Date of Service: 09/19/2020 1:45 PM Medical Record Number: [id number] Patient Account Number: [id number] Date of Birth/Sex: 29-Sep-1985 (34 y.o. F) Treating RN: Donnamarie Poag Primary Care Eilyn Polack: Addison Naegeli Other Clinician: Referring Tressia Labrum: Addison Naegeli Treating Adonica Fukushima/Extender: Skipper Cliche in Treatment: 7 Active Inactive Wound/Skin Impairment Nursing Diagnoses: Impaired tissue integrity Goals: Patient/caregiver will verbalize understanding of skin care regimen Date Initiated: 07/26/2020 Date Inactivated: 09/19/2020 Target Resolution Date: 07/26/2020 Goal Status: Met Ulcer/skin breakdown will have a volume reduction of 30% by week 4 Date Initiated: 07/26/2020 Date Inactivated: 09/19/2020 Target Resolution Date: 08/25/2020 Goal Status: Met Ulcer/skin breakdown will have a volume reduction of 50% by week  8 Date Initiated: 07/26/2020 Target Resolution Date: 09/25/2020 Goal Status: Active Ulcer/skin breakdown will have a volume reduction of 80% by week 12 Date Initiated: 07/26/2020 Target Resolution Date: 10/25/2020 Goal Status: Active Ulcer/skin breakdown will heal within 14 weeks Date Initiated: 07/26/2020 Target Resolution Date: 11/25/2020 Goal Status: Active Interventions: Assess patient/caregiver ability to obtain necessary supplies Assess patient/caregiver ability to perform ulcer/skin care regimen upon admission and as needed Assess ulceration(s) every visit Provide education on ulcer and skin care Treatment Activities: Referred to DME [place] for dressing supplies : 07/26/2020 Skin care regimen initiated : 07/26/2020 Notes: Electronic Signature(s) Signed: 09/19/2020 3:39:51 PM By: Donnamarie Poag Entered By: Donnamarie Poag on 09/19/2020 14:27:23 Clerk, Herminio [name] ([id number]) -------------------------------------------------------------------------------- Pain Assessment Details Patient Name: [name], [name] Date of Service: 09/19/2020 1:45 PM Medical Record Number: [id number] Patient Account Number: [id number] Date of Birth/Sex: Mar 30, 1986 (34 y.o. F) Treating RN: Cornell Barman Primary Care Iyanni Hepp: Addison Naegeli Other Clinician: Referring Fraya Ueda: Addison Naegeli Treating Takirah Binford/Extender: Skipper Cliche in Treatment: 7 Active Problems Location of Pain Severity and Description of Pain Patient Has Paino No Site Locations Pain Management and Medication Current Pain Management: Electronic Signature(s) Signed: 09/20/2020 4:30:32 PM By: Gretta Cool, [name], RN, CWS, [name], [name] Entered By: Gretta Cool, [name], RN, CWS, Kim on 09/19/2020 14:11:10 [name] ([id number]) -------------------------------------------------------------------------------- Patient/Caregiver Education Details Patient Name: [name], [name] Date of Service: 09/19/2020 1:45 PM Medical Record Number: [id number] Patient  Account Number: [id number] Date of Birth/Gender: 1985/08/10 (35 y.o. F) Treating RN: Donnamarie Poag Primary Care Physician: Addison Naegeli Other Clinician: Referring Physician: Addison Naegeli Treating Physician/Extender: Skipper Cliche in Treatment: 7 Education Assessment Education Provided To: Patient Education Topics Provided Venous: Wound/Skin Impairment: Electronic Signature(s) Signed: 09/19/2020 3:39:51 PM By: Donnamarie Poag Entered By: Donnamarie Poag on 09/19/2020 14:35:18 Waitman, Herminio [name] ([id number]) -------------------------------------------------------------------------------- Wound Assessment Details Patient Name: [name], [name]. Date of Service: 09/19/2020 1:45 PM Medical Record Number: [id number] Patient Account Number: [id number] Date of Birth/Sex: 03-Jun-1985 (34 y.o. F) Treating RN: Donnamarie Poag Primary Care Krist Rosenboom: Addison Naegeli Other Clinician: Referring Leonides Minder: Addison Naegeli Treating Lawson Isabell/Extender: Skipper Cliche in Treatment: 7 Wound Status Wound Number: 3 Primary Etiology: Venous Leg Ulcer Wound Location: Left, Distal, Medial Lower Leg Secondary Etiology: Lymphedema Wounding Event: Gradually Appeared Wound Status: Open Date Acquired: 07/18/2020 Comorbid History: Hypertension, Peripheral Venous Disease Weeks Of Treatment: 7 Clustered Wound: No Photos Wound Measurements Length: (cm) 0.5 Width: (cm) 0.8 Depth: (cm) 0.2 Area: (cm)  0.314 Volume: (cm) 0.063 % Reduction in Area: -912.9% % Reduction in Volume: -2000% Epithelialization: None Wound Description Classification: Full Thickness Without Exposed Support Structures Wound Margin: Flat and Intact Exudate Amount: Medium Exudate Type: Serosanguineous Exudate Color: red, brown Foul Odor After Cleansing: No Slough/Fibrino Yes Wound Bed Granulation Amount: Small (1-33%) Exposed Structure Granulation Quality: Red Fascia Exposed: No Necrotic Amount: Large (67-100%) Fat Layer (Subcutaneous Tissue)  Exposed: Yes Tendon Exposed: No Muscle Exposed: No Joint Exposed: No Bone Exposed: No Treatment Notes Wound #3 (Lower Leg) Wound Laterality: Left, Medial, Distal Cleanser Soap and Water Discharge Instruction: Gently cleanse wound with antibacterial soap, rinse and pat dry prior to dressing wounds Peri-Wound Care [name], [name] ([id number]) Topical Primary Dressing Iodosorb 40 (g) Discharge Instruction: Apply IodoSorb to wound bed only as directed. Secondary Dressing ABD Pad 5x9 (in/in) Discharge Instruction: Cover with ABD pad Secured With 23M ACE Elastic Bandage With VELCRO Brand Closure, 4 (in) Discharge Instruction: Wrap from ankle to calf-patient to adjust if ACE wrap falls Compression Wrap Compression Stockings Add-Ons Electronic Signature(s) Signed: 09/19/2020 3:39:51 PM By: Donnamarie Poag Entered By: Donnamarie Poag on 09/19/2020 14:37:33 Benscoter, Herminio [name] ([id number]) -------------------------------------------------------------------------------- [place] Details Patient Name: [name], [name] Date of Service: 09/19/2020 1:45 PM Medical Record Number: [id number] Patient Account Number: [id number] Date of Birth/Sex: [birth year] (34 y.o. F) Treating RN: Cornell Barman Primary Care Ailee Pates: Addison Naegeli Other Clinician: Referring Abdou Stocks: Addison Naegeli Treating Javonta Gronau/Extender: Skipper Cliche in Treatment: 7 Vital Signs Time Taken: 14:10 Temperature (F): 98.3 Height (in): 66 Pulse (bpm): 95 Weight (lbs): 388 Respiratory Rate (breaths/min): 16 Body Mass Index (BMI): 62.6 Blood Pressure (mmHg): 113/80 Reference Range: 80 - 120 mg / dl Electronic Signature(s) Signed: 09/20/2020 4:30:32 PM By: Gretta Cool, [name], RN, CWS, [name], [name] Entered By: Gretta Cool, [name], RN, CWS, Kim on 09/19/2020 14:10:55

## 2020-09-22 ENCOUNTER — Ambulatory Visit (HOSPITAL_COMMUNITY)
Admission: RE | Admit: 2020-09-22 | Discharge: 2020-09-22 | Disposition: A | Discharge status: Home / Self Care | Payer: BC Managed Care – PPO | Source: Ambulatory Visit | Attending: Vascular Surgery | Admitting: Vascular Surgery

## 2020-09-22 ENCOUNTER — Ambulatory Visit (INDEPENDENT_AMBULATORY_CARE_PROVIDER_SITE_OTHER): Payer: BC Managed Care – PPO | Admitting: Vascular Surgery

## 2020-09-22 ENCOUNTER — Encounter: Payer: Self-pay | Admitting: Vascular Surgery

## 2020-09-22 ENCOUNTER — Other Ambulatory Visit: Payer: Self-pay

## 2020-09-22 VITALS — BP 134/81 | HR 80 | Temp 97.8°F | Resp 20 | Ht 66.0 in | Wt 388.0 lb

## 2020-09-22 DIAGNOSIS — I83812 Varicose veins of left lower extremities with pain: Secondary | ICD-10-CM

## 2020-09-22 DIAGNOSIS — I83893 Varicose veins of bilateral lower extremities with other complications: Secondary | ICD-10-CM | POA: Insufficient documentation

## 2020-09-22 NOTE — Progress Notes (Signed)
Referring [place]  Patient name: [name] MRN: [id number] DOB: [birth year] Sex: female  REASON FOR CONSULT: Venous stasis ulcer left leg  HPI: HAZELENE DOTEN is a 35 y.o. female, with previous history of venous ulcers in the right leg.  She eventually underwent a vein stripping of her greater saphenous vein in the right leg by Dr. Arbie Cookey in 2016.  She has not really had right leg symptoms since then.  She does intermittently continue to wear some compression stockings.  About 4 to 6 weeks ago she developed a new ulceration on the left leg near the medial malleolus.  She is currently being treated with local wound care for this.  She does not have a history of diabetes.  Other medical problems include obesity, hypertension chronic back pain all of which have been stable.  Past Medical History:  Diagnosis Date   Ankle ulcer (HCC)    right   Childhood asthma    Chronic lower back pain    GERD (gastroesophageal reflux disease)    Headache    "only when I'm hungry" (10/14/2015)   Hypertension    Obesity    Varicose veins    VSD (ventricular septal defect)    "had hole in bottom of her heart; closed up on it's own; no OR" /mom 10/14/2015)   Past Surgical History:  Procedure Laterality Date   HERNIA REPAIR     INSERTION OF MESH N/A 10/14/2015   Procedure: INSERTION OF MESH;  Surgeon: Avel Peace, MD;  Location: [facility] OR;  Service: General;  Laterality: N/A;   LAPAROSCOPIC INCISIONAL / UMBILICAL / VENTRAL HERNIA REPAIR  10/14/2015   VHR w/mesh   TONSILLECTOMY AND ADENOIDECTOMY  1990s   TYMPANOSTOMY TUBE PLACEMENT Bilateral 1990s   VEIN LIGATION AND STRIPPING Right 12/15/2014   Procedure: SAPHENOUS VEIN LIGATION AND STRIPPING; GREATER THAN 20 STAB PHLEBECTOMIES;  Surgeon: Larina Earthly, MD;  Location: [facility] OR;  Service: Vascular;  Laterality: Right;   VENTRAL HERNIA REPAIR N/A 10/14/2015   Procedure: LAPAROSCOPIC VENTRAL HERNIA;  Surgeon: Avel Peace, MD;  Location: [facility] OR;   Service: General;  Laterality: N/A;    Family History  Problem Relation Age of Onset   Diabetes Mother    Varicose Veins Mother    Hypertension Mother    Hyperlipidemia Mother    Healthy Father    Varicose Veins Sister    Cancer Sister        unsure of type but requiring hysterectomy    SOCIAL HISTORY: Social History   Socioeconomic History   Marital status: Single    Spouse name: Not on file   Number of children: Not on file   Years of education: Not on file   Highest education level: Not on file  Occupational History   Not on file  Tobacco Use   Smoking status: Never   Smokeless tobacco: Never  Vaping Use   Vaping Use: Never used  Substance and Sexual Activity   Alcohol use: Yes    Comment:  socially   Drug use: Yes    Types: Marijuana   Sexual activity: Yes    Birth control/protection: None  Other Topics Concern   Not on file  Social History Narrative   Not on file   Social Determinants of Health   Financial Resource Strain: Not on file  Food Insecurity: Not on file  Transportation Needs: Not on file  Physical Activity: Not on file  Stress: Not on file  Social  Connections: Not on file  Intimate Partner Violence: Not on file    Allergies  Allergen Reactions   Tramadol Other (See Comments)    Did not make her feel like herself    Current Outpatient Medications  Medication Sig Dispense Refill   albuterol (VENTOLIN HFA) 108 (90 Base) MCG/ACT inhaler Inhale 2 puffs into the lungs every 6 (six) hours as needed for wheezing or shortness of breath. 8 g 2   cetirizine (ZYRTEC) 10 MG tablet Take 1 tablet (10 mg total) by mouth daily. 30 tablet 11   cyclobenzaprine (FLEXERIL) 5 MG tablet Take 1 tablet (5 mg total) by mouth 3 (three) times daily as needed for muscle spasms. 30 tablet 0   diclofenac (VOLTAREN) 75 MG EC tablet TAKE 1 TABLET BY MOUTH TWICE A DAY 90 tablet 1   fluticasone (FLONASE) 50 MCG/ACT nasal spray Place 2 sprays into both nostrils daily. 16  g 6   Multiple Vitamin (MULTIVITAMIN) tablet Take 2 tablets by mouth daily. Vitafusion gummies     naproxen (NAPROSYN) 375 MG tablet Take 1 tablet (375 mg total) by mouth 2 (two) times daily with a meal. 20 tablet 0   pantoprazole (PROTONIX) 20 MG tablet Take 1 tablet (20 mg total) by mouth daily. 90 tablet 1   dextromethorphan-guaiFENesin (MUCINEX DM) 30-600 MG 12hr tablet Take 1 tablet by mouth 2 (two) times daily. (Patient not taking: Reported on 09/22/2020) 20 tablet 0   No current facility-administered medications for this visit.    ROS:   General:  No weight loss, Fever, chills  HEENT: No recent headaches, no nasal bleeding, no visual changes, no sore throat  Neurologic: No dizziness, blackouts, seizures. No recent symptoms of stroke or mini- stroke. No recent episodes of slurred speech, or temporary blindness.  Cardiac: No recent episodes of chest pain/pressure, no shortness of breath at rest.  No shortness of breath with exertion.  Denies history of atrial fibrillation or irregular heartbeat  Vascular: No history of rest pain in feet.  No history of claudication.  No history of non-healing ulcer, No history of DVT   Pulmonary: No home oxygen, no productive cough, no hemoptysis,  No asthma or wheezing  Musculoskeletal:  [ ]  Arthritis, [ ]  Low back pain,  [ ]  Joint pain  Hematologic:No history of hypercoagulable state.  No history of easy bleeding.  No history of anemia  Gastrointestinal: No hematochezia or melena,  No gastroesophageal reflux, no trouble swallowing  Urinary: [ ]  chronic Kidney disease, [ ]  on HD - [ ]  MWF or [ ]  TTHS, [ ]  Burning with urination, [ ]  Frequent urination, [ ]  Difficulty urinating;   Skin: No rashes  Psychological: No history of anxiety,  No history of depression   Physical Examination  Vitals:   09/22/20 1408  BP: 134/81  Pulse: 80  Resp: 20  Temp: 97.8 F (36.6 C)  SpO2: 96%  Weight: (!) 388 lb (176 kg)  Height: 5\' 6"  (1.676 m)     Body mass index is 62.62 kg/m.  General:  Alert and oriented, no acute distress HEENT: Normal Neck: No bruit or JVD Pulmonary: Clear to auscultation bilaterally Cardiac: Regular Rate and Rhythm  Abdomen: Soft, non-tender, non-distended, obese Skin: No rash, 4 cm ulceration left medial malleolus approximately 1 mm depth Extremity Pulses:  2+ radial, brachial, femoral, dorsalis pedis pulses bilaterally Musculoskeletal: No deformity or edema  Neurologic: Upper and lower extremity motor 5/5 and symmetric  DATA:  Patient had a venous reflux  exam today which shows her left greater saphenous vein is 8 to 12 mm in diameter.  Of note she does still have a dilated greater saphenous in the right leg but this is only from the knee down.  She had no deep vein reflux on the left and had mild deep vein reflux in the common femoral vein on the right.  I reviewed and interpreted the study.  ASSESSMENT: Venous related ulceration left medial malleolus.  Patient currently is receiving local wound care and compressive dressings for this.  I discussed with her today the possibility of stripping her left greater saphenous vein similar to procedure if she had previously done.  At this point she would prefer a few more weeks of local wound care to see if the wound will heal spontaneously.   PLAN: Patient will follow up with me in 1 month's time.  If the wound is not completely healed we will revisit whether or not she wishes to have vein stripping performed on the left leg.  She was given knee-high compression stockings today in the office which were able to go to the mid calf.  She is going to try to get some custom fitted in the next few weeks.   Fabienne Bruns, MD [facility] Office: [phone]

## 2020-09-26 ENCOUNTER — Ambulatory Visit: Payer: BC Managed Care – PPO | Admitting: Physician Assistant

## 2020-09-27 ENCOUNTER — Other Ambulatory Visit: Payer: Self-pay

## 2020-09-27 ENCOUNTER — Encounter: Payer: BC Managed Care – PPO | Admitting: Physician Assistant

## 2020-09-27 DIAGNOSIS — L98492 Non-pressure chronic ulcer of skin of other sites with fat layer exposed: Secondary | ICD-10-CM | POA: Diagnosis not present

## 2020-09-27 NOTE — Progress Notes (Addendum)
[name] ([id number]) Visit Report for 09/27/2020 Chief Complaint Document Details Patient Name: [name], [name]. Date of Service: 09/27/2020 2:45 PM Medical Record Number: [id number] Patient Account Number: [id number] Date of Birth/Sex: 31-Aug-1985 (35 y.o. F) Treating RN: Rogers Blocker Primary Care Provider: Lenor Coffin Other Clinician: Referring Provider: Lenor Coffin Treating Provider/Extender: Rowan Blase in Treatment: 9 Information Obtained from: Patient Chief Complaint Left LE Ulcer Electronic Signature(s) Signed: 09/27/2020 3:37:04 PM By: Lenda Kelp PA-C Entered By: Lenda Kelp on 09/27/2020 15:37:04 [name], [name] ([id number]) -------------------------------------------------------------------------------- HPI Details Patient Name: [name], [name] Date of Service: 09/27/2020 2:45 PM Medical Record Number: [id number] Patient Account Number: [id number] Date of Birth/Sex: [birth year] (35 y.o. F) Treating RN: Rogers Blocker Primary Care Provider: Lenor Coffin Other Clinician: Referring Provider: Lenor Coffin Treating Provider/Extender: Rowan Blase in Treatment: 9 History of Present Illness HPI Description: 35 year old patient who started with having ulcerations on the right lower leg on the lateral part of her ankle for about 2 weeks. She was seen in the ER at [facility] and advised to see the wound care for a consultation. No X-rays of workup was done during the ER visit and no prescription for any medications of compression wraps were given. the patient is not diabetic but does have hypertension and her medications have been reviewed by me. 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 The right and left greater saphenous vein was found to be tortuous. Deep venous system was also  not competent and there was reflux of more than 500 ms. She was then seen by Dr. Tawanna Cooler Early who recommended that the patient would not benefit from endovenous ablation and he had recommended vein stripping on the right side and multiple small phlebectomy procedures on the left side. the patient did not follow-up due to social economic reasons. She has not been wearing any compression stockings and has not taken any specific treatment for varicose veins for the last 3 years. 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 We have obtained some appointments to see the vascular surgeons by the end of the month and the patient would like to follow up with me at my [facility] on Wednesday, June 29. 10/14/2014 -- she could not see me yesterday in [place] and hence has come for a review today. She has a vascular workup to be done this afternoon at [facility]. She is doing fine otherwise. 10/22/2014 -- she was seen by Dr. [name] and he has recommended surgical removal of her right saphenous vein from distal thigh to saphenofemoral junction and stab phlebectomy's of multiple large tributary branches throughout her thigh and calf. This would be done under general anesthesia in the outpatient setting. 10/29/2014 -- she is trying to work on a surgical date and in the meanwhile we have got insurance clearance for Apligraf and we will start this next week. 7/22 2016 -- she is here for the first application of Apligraf. 11/19/2014 -- she is here for a second application of Apligraf 11/26/2014 -- she has done fine after her last application of Apligraf and is awaiting her surgery which is scheduled for August 31. 12/03/2014 -- she is doing fine and is here for her third application  of Apligraf. 12/21/2014 -- She had surgery on 12/15/2014 by Dr.Early who did #1 ligation and stripping of right great  saphenous vein from distal thigh to saphenofemoral junction, #2 stab phlebectomy of large tributary varicose veins in the thigh popliteal space and calf. She had an Ace wrap up to her groin and this was removed today and the Unna's boot was also removed. 12/28/2014 -- she is here for her fourth application of Apligraf. 01/06/2015 - he saw her vascular surgeon Dr. Donnetta Hutching who was pleased with her progress and he has confirmed that no surgical procedures could be attempted on the left side. 01/13/2015 -- her wound looks very good and she's been having no problems whatsoever. Readmission: 07/26/2020 upon evaluation today patient presents for initial inspection here in our clinic for a new issue with her left leg although she is previously been seen due to issues with the right leg back in 2016. At that time she was seeing Dr. Donnetta Hutching who is a vein/vascular specialist in [place]. He has since semiretired from what I understand. He is working out of [employer] I believe. Nonetheless she tells me at the time that there was really nothing to do for her left leg although the right leg was where they did most of the work. Subsequently she states that she is done fairly well until just in the past week where she had issues with bleeding from what appears to be varicose vein on the left leg medially. Unfortunately this has continued to be an issue although she tells me at first it was coming much more significantly Down quite a bit but nonetheless has not completely resolved. Every time she showers she notices that it starts to drain a little bit more. She does have a history of chronic venous insufficiency, lymphedema, varicose veins bilaterally, and obesity. 08/02/2020 upon evaluation today patient appears to be doing about the same in regards to the ulcer on her left leg. She has some eschar covering there is definitely some fluid collecting underneath unfortunately. With that being said she tells me she is  still having a tremendous amount of pain therefore she is really not able to allow me to clean this off very effectively to be perfectly honest. I think we need to try to soften this up 08/16/2020 upon evaluation today patient's wound is really not doing significantly better not really states about the same. She notes that the wrap just does not seem to be staying up very well at all unfortunately. No fevers, chills, nausea, vomiting, or diarrhea. She did cut it off once it starts to slide in order to alleviate some of the pressure from sliding Down. Fortunately there is no signs of active infection at this time which is great news. 08/23/2020 upon evaluation today patient appears to be doing well 08/23/2020 upon evaluation today patient appears to be doing well with regard to her wound all things considered. Fortunately there does not appear to be any signs of active infection at this time which is great news. She has been tolerating the dressing changes without complication and overall I am extremely pleased with where things stand at this point. She does have her appointment with vascular in [facility] on June 9. [name], [name] ([id number]) 08/30/2020 upon evaluation today patient actually appears to be doing decently well in regard to her wound. Fortunately there is no signs of active infection which is great news. Nonetheless I do believe that the patient is going require little bit of debridement if  she is okay with me attempting that today I think that will help clean off some of the necrotic tissue. Fortunately there does not appear to be otherwise any evidence of active infection which is also great news. 09/19/2020 upon evaluation today patient appears to be doing a little better in regard to her wound as compared to previous. Fortunately there does not appear to be any signs of active infection overall. No fever chills noted. I do believe that the Iodosorb has made this a little bit  better with regard to the overall size and appearance of the wound bed though again she does still have quite a ways to go to get this to heal she still very tender to touch. 09/27/2020 upon evaluation today patient appears to actually be doing quite well with regard to her wound. This is measuring much smaller which is great news. With that being said she did see vein and vascular in [facility] and they subsequently recommended that surgery is really what she probably needs to go forward with sounds like the potential for venous ablation. With that being said the patient tells me this is just not the right time for her to be able to proceed with any type of surgery which I completely understand. Nonetheless I do believe that she would continue to benefit from compression but again that is really not something that she is able to easily do. Electronic Signature(s) Signed: 09/29/2020 9:52:48 AM By: Lenda KelpStone III, Lucendia Leard PA-C Entered By: Lenda KelpStone III, Breyer Tejera on 09/29/2020 09:52:48 Binstock, [name] ([id number]) -------------------------------------------------------------------------------- Physical Exam Details Patient Name: [name] SimmondsSUMMERS, Emmalea J. Date of Service: 09/27/2020 2:45 PM Medical Record Number: [id number] Patient Account Number: [id number] Date of Birth/Sex: 05/18/1985 (35 y.o. F) Treating RN: Rogers BlockerSanchez, Kenia Primary Care Provider: Lenor Coffinlson, Daniel Other Clinician: Referring Provider: Lenor Coffinlson, Daniel Treating Provider/Extender: Rowan BlaseStone, Lucina Betty Weeks in Treatment: 9 Constitutional Well-nourished and well-hydrated in no acute distress. Respiratory normal breathing without difficulty. Psychiatric this patient is able to make decisions and demonstrates good insight into disease process. Alert and Oriented x 3. pleasant and cooperative. Notes Upon inspection patient's wound bed actually showed signs of good granulation epithelization at this point. There does not appear to be any evidence of active infection  which is great and overall very pleased in that regard. Electronic Signature(s) Signed: 09/29/2020 9:53:03 AM By: Lenda KelpStone III, Hollace Michelli PA-C Entered By: Lenda KelpStone III, Aili Casillas on 09/29/2020 09:53:03 Stejskal, [name] ([id number]) -------------------------------------------------------------------------------- Physician Orders Details Patient Name: [name] SimmondsSUMMERS, Jhada J. Date of Service: 09/27/2020 2:45 PM Medical Record Number: [id number] Patient Account Number: [id number] Date of Birth/Sex: 10/29/1985 (35 y.o. F) Treating RN: Rogers BlockerSanchez, Kenia Primary Care Provider: Lenor Coffinlson, Daniel Other Clinician: Referring Provider: Lenor Coffinlson, Daniel Treating Provider/Extender: Rowan BlaseStone, Nasim Habeeb Weeks in Treatment: 9 Verbal / Phone Orders: No Diagnosis Coding ICD-10 Coding Code Description I87.2 Venous insufficiency (chronic) (peripheral) I89.0 Lymphedema, not elsewhere classified I83.893 Varicose veins of bilateral lower extremities with other complications L98.492 Non-pressure chronic ulcer of skin of other sites with fat layer exposed E66.01 Morbid (severe) obesity due to excess calories Follow-up Appointments o Return Appointment in 1 week. Bathing/ Shower/ Hygiene o Wash wounds with antibacterial soap and water. Edema Control - Lymphedema / Segmental Compressive Device / Other Left Lower Extremity o Elevate, Exercise Daily and Avoid Standing for Long Periods of Time. o Elevate legs to the level of the heart and pump ankles as often as possible o Elevate leg(s) parallel to the floor when sitting. Wound Treatment Wound #3 - Lower Leg Wound Laterality: Left, Medial,  Distal Cleanser: Soap and Water 3 x Per Week/15 Days Discharge Instructions: Gently cleanse wound with antibacterial soap, rinse and pat dry prior to dressing wounds Primary Dressing: Iodosorb 40 (g) (Generic) 3 x Per Week/15 Days Discharge Instructions: Apply IodoSorb to wound bed only as directed. Secondary Dressing: ABD Pad 5x9 (in/in) (Generic) 3 x  Per Week/15 Days Discharge Instructions: Cover with ABD pad Secured With: 70M ACE Elastic Bandage With VELCRO Brand Closure, 4 (in) (Generic) 3 x Per Week/15 Days Discharge Instructions: Wrap from ankle to calf-patient to adjust if ACE wrap falls Electronic Signature(s) Signed: 09/27/2020 4:32:42 PM By: Phillis Haggis, Dondra Prader RN Signed: 09/29/2020 4:01:31 PM By: Lenda Kelp PA-C Entered By: Phillis Haggis, Dondra Prader on 09/27/2020 15:41:11 [name], [name] ([id number]) -------------------------------------------------------------------------------- Problem List Details Patient Name: [name], [name]. Date of Service: 09/27/2020 2:45 PM Medical Record Number: [id number] Patient Account Number: [id number] Date of Birth/Sex: [birth year] (35 y.o. F) Treating RN: Rogers Blocker Primary Care Provider: Lenor Coffin Other Clinician: Referring Provider: Lenor Coffin Treating Provider/Extender: Rowan Blase in Treatment: 9 Active Problems ICD-10 Encounter Code Description Active Date MDM Diagnosis I87.2 Venous insufficiency (chronic) (peripheral) 07/26/2020 No Yes I89.0 Lymphedema, not elsewhere classified 07/26/2020 No Yes I83.893 Varicose veins of bilateral lower extremities with other complications 07/26/2020 No Yes L98.492 Non-pressure chronic ulcer of skin of other sites with fat layer exposed 07/26/2020 No Yes E66.01 Morbid (severe) obesity due to excess calories 07/26/2020 No Yes Inactive Problems Resolved Problems Electronic Signature(s) Signed: 09/27/2020 3:36:58 PM By: Lenda Kelp PA-C Entered By: Lenda Kelp on 09/27/2020 15:36:58 [name], [name] ([id number]) -------------------------------------------------------------------------------- Progress Note Details Patient Name: [name] Date of Service: 09/27/2020 2:45 PM Medical Record Number: [id number] Patient Account Number: [id number] Date of Birth/Sex: 08-09-1985 (35 y.o. F) Treating RN: Rogers Blocker Primary  Care Provider: Lenor Coffin Other Clinician: Referring Provider: Lenor Coffin Treating Provider/Extender: Rowan Blase in Treatment: 9 Subjective Chief Complaint Information obtained from Patient Left LE Ulcer History of Present Illness (HPI) 35 year old patient who started with having ulcerations on the right lower leg on the lateral part of her ankle for about 2 weeks. She was seen in the ER at [facility] and advised to see the wound care for a consultation. No X-rays of workup was done during the ER visit and no prescription for any medications of compression wraps were given. the patient is not diabetic but does have hypertension and her medications have been reviewed by me. 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 The right and left greater saphenous vein was found to be tortuous. Deep venous system was also not competent and there was reflux of more than 500 ms. She was then seen by Dr. Tawanna Cooler Early who recommended that the patient would not benefit from endovenous ablation and he had recommended vein stripping on the right side and multiple small phlebectomy procedures on the left side. the patient did not follow-up due to social economic reasons. She has not been wearing any compression stockings and has not taken any specific treatment for varicose veins for the last 3 years. 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 We have obtained some appointments to see the vascular surgeons by the end of the  month and the patient would like to follow up with me at my [facility] on Wednesday, June 29. 10/14/2014 -- she could not see me yesterday in [place] and hence has come for a review today. She has a  vascular workup to be done this afternoon at [facility]. She is doing fine otherwise. 10/22/2014 -- she was seen by Dr. [name] and he has recommended surgical removal of her right saphenous vein from distal thigh to saphenofemoral junction and stab phlebectomy's of multiple large tributary branches throughout her thigh and calf. This would be done under general anesthesia in the outpatient setting. 10/29/2014 -- she is trying to work on a surgical date and in the meanwhile we have got insurance clearance for Apligraf and we will start this next week. 7/22 2016 -- she is here for the first application of Apligraf. 11/19/2014 -- she is here for a second application of Apligraf 11/26/2014 -- she has done fine after her last application of Apligraf and is awaiting her surgery which is scheduled for August 31. 12/03/2014 -- she is doing fine and is here for her third application of Apligraf. 12/21/2014 -- She had surgery on 12/15/2014 by Dr.Early who did #1 ligation and stripping of right great saphenous vein from distal thigh to saphenofemoral junction, #2 stab phlebectomy of large tributary varicose veins in the thigh popliteal space and calf. She had an Ace wrap up to her groin and this was removed today and the Unna's boot was also removed. 12/28/2014 -- she is here for her fourth application of Apligraf. 01/06/2015 - he saw her vascular surgeon Dr. Arbie Cookey who was pleased with her progress and he has confirmed that no surgical procedures could be attempted on the left side. 01/13/2015 -- her wound looks very good and she's been having no problems whatsoever. Readmission: 07/26/2020 upon evaluation today patient presents for initial inspection here in our clinic for a new issue with her left leg although she is previously been seen due to issues with the right leg back in 2016. At that time she was seeing Dr. Arbie Cookey who is a vein/vascular specialist in [place]. He has since semiretired from  what I understand. He is working out of [employer] I believe. Nonetheless she tells me at the time that there was really nothing to do for her left leg although the right leg was where they did most of the work. Subsequently she states that she is done fairly well until just in the past week where she had issues with bleeding from what appears to be varicose vein on the left leg medially. Unfortunately this has continued to be an issue although she tells me at first it was coming much more significantly Down quite a bit but nonetheless has not completely resolved. Every time she showers she notices that it starts to drain a little bit more. She does have a history of chronic venous insufficiency, lymphedema, varicose veins bilaterally, and obesity. 08/02/2020 upon evaluation today patient appears to be doing about the same in regards to the ulcer on her left leg. She has some eschar covering there is definitely some fluid collecting underneath unfortunately. With that being said she tells me she is still having a tremendous amount of pain therefore she is really not able to allow me to clean this off very effectively to be perfectly honest. I think we need to try to soften this up 08/16/2020 upon evaluation today patient's wound is really not doing significantly better not really states about  the same. She notes that the wrap just does not seem to be staying up very well at all unfortunately. No fevers, chills, nausea, vomiting, or diarrhea. She did cut it off once it starts to slide in order to alleviate some of the pressure from sliding Down. Fortunately there is no signs of active infection at this time which is great news. [name], [name] ([id number]) 08/23/2020 upon evaluation today patient appears to be doing well 08/23/2020 upon evaluation today patient appears to be doing well with regard to her wound all things considered. Fortunately there does not appear to be any signs of active infection at  this time which is great news. She has been tolerating the dressing changes without complication and overall I am extremely pleased with where things stand at this point. She does have her appointment with vascular in [facility] on June 9. 08/30/2020 upon evaluation today patient actually appears to be doing decently well in regard to her wound. Fortunately there is no signs of active infection which is great news. Nonetheless I do believe that the patient is going require little bit of debridement if she is okay with me attempting that today I think that will help clean off some of the necrotic tissue. Fortunately there does not appear to be otherwise any evidence of active infection which is also great news. 09/19/2020 upon evaluation today patient appears to be doing a little better in regard to her wound as compared to previous. Fortunately there does not appear to be any signs of active infection overall. No fever chills noted. I do believe that the Iodosorb has made this a little bit better with regard to the overall size and appearance of the wound bed though again she does still have quite a ways to go to get this to heal she still very tender to touch. 09/27/2020 upon evaluation today patient appears to actually be doing quite well with regard to her wound. This is measuring much smaller which is great news. With that being said she did see vein and vascular in [facility] and they subsequently recommended that surgery is really what she probably needs to go forward with sounds like the potential for venous ablation. With that being said the patient tells me this is just not the right time for her to be able to proceed with any type of surgery which I completely understand. Nonetheless I do believe that she would continue to benefit from compression but again that is really not something that she is able to easily do. Objective Constitutional Well-nourished and well-hydrated in no acute  distress. Vitals Time Taken: 3:27 PM, Height: 66 in, Weight: 388 lbs, BMI: 62.6, Temperature: 97.6 F, Pulse: 94 bpm, Respiratory Rate: 18 breaths/min, Blood Pressure: 106/78 mmHg. Respiratory normal breathing without difficulty. Psychiatric this patient is able to make decisions and demonstrates good insight into disease process. Alert and Oriented x 3. pleasant and cooperative. General Notes: Upon inspection patient's wound bed actually showed signs of good granulation epithelization at this point. There does not appear to be any evidence of active infection which is great and overall very pleased in that regard. Integumentary (Hair, Skin) Wound #3 status is Open. Original cause of wound was Gradually Appeared. The date acquired was: 07/18/2020. The wound has been in treatment 9 weeks. The wound is located on the Left,Distal,Medial Lower Leg. The wound measures 0.5cm length x 0.5cm width x 0.2cm depth; 0.196cm^2 area and 0.039cm^3 volume. There is Fat Layer (Subcutaneous Tissue) exposed. There is no  tunneling or undermining noted. There is a medium amount of serosanguineous drainage noted. The wound margin is flat and intact. There is small (1-33%) red granulation within the wound bed. There is a large (67-100%) amount of necrotic tissue within the wound bed. Assessment Active Problems ICD-10 Venous insufficiency (chronic) (peripheral) Lymphedema, not elsewhere classified Varicose veins of bilateral lower extremities with other complications Non-pressure chronic ulcer of skin of other sites with fat layer exposed Morbid (severe) obesity due to excess calories Plan [name], [name] ([id number]) Follow-up Appointments: Return Appointment in 1 week. Bathing/ Shower/ Hygiene: Wash wounds with antibacterial soap and water. Edema Control - Lymphedema / Segmental Compressive Device / Other: Elevate, Exercise Daily and Avoid Standing for Long Periods of Time. Elevate legs to the level of the  heart and pump ankles as often as possible Elevate leg(s) parallel to the floor when sitting. WOUND #3: - Lower Leg Wound Laterality: Left, Medial, Distal Cleanser: Soap and Water 3 x Per Week/15 Days Discharge Instructions: Gently cleanse wound with antibacterial soap, rinse and pat dry prior to dressing wounds Primary Dressing: Iodosorb 40 (g) (Generic) 3 x Per Week/15 Days Discharge Instructions: Apply IodoSorb to wound bed only as directed. Secondary Dressing: ABD Pad 5x9 (in/in) (Generic) 3 x Per Week/15 Days Discharge Instructions: Cover with ABD pad Secured With: 20M ACE Elastic Bandage With VELCRO Brand Closure, 4 (in) (Generic) 3 x Per Week/15 Days Discharge Instructions: Wrap from ankle to calf-patient to adjust if ACE wrap falls 1. Would recommend currently that we actually going continue with the Iodoflex which I feel like has been beneficial for her up to this point. 2. I am also can recommend that we have the patient continue with an ABD pad to cover followed by an Ace wrap to secure in place. 3. With regard to the compression wrap she just was not able to tolerate that previous due to pain this is a very painful situation for her in general. We will see how things continue to progress over the next few weeks hopefully will get the wound closed. 4. With regard to the surgery recommended by the vein specialist she tells me she cannot do that right now is just not the right time. We will see patient back for reevaluation in 1 week here in the clinic. If anything worsens or changes patient will contact our office for additional recommendations. Electronic Signature(s) Signed: 09/29/2020 9:53:46 AM By: Lenda Kelp PA-C Entered By: Lenda Kelp on 09/29/2020 09:53:46 [name], [name] ([id number]) -------------------------------------------------------------------------------- SuperBill Details Patient Name: KIMIYAH, BLICK Date of Service: 09/27/2020 Medical Record Number:  [id number] Patient Account Number: [id number] Date of Birth/Sex: 12-13-1985 (35 y.o. 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 E66.01 Morbid (severe) obesity due to excess calories Facility Procedures CPT4 Code: 85462703 Description: 99213 - WOUND CARE VISIT-LEV 3 EST PT Modifier: Quantity: 1 Physician Procedures CPT4 Code: 5009381 Description: 99214 - WC PHYS LEVEL 4 - EST PT Modifier: Quantity: 1 CPT4 Code: Description: ICD-10 Diagnosis Description I87.2 Venous insufficiency (chronic) (peripheral) I89.0 Lymphedema, not elsewhere classified W29.937 Varicose veins of bilateral lower extremities with other complications L98.492 Non-pressure chronic ulcer of  skin of other sites  with fat layer expos Modifier: ed Quantity: Electronic Signature(s) Signed: 09/29/2020 9:54:05 AM By: Lenda Kelp PA-C Previous Signature: 09/27/2020 4:32:42 PM Version By: Phillis Haggis, Dondra Prader RN Entered By: Lenda Kelp on 09/29/2020 09:54:04

## 2020-09-28 NOTE — Progress Notes (Signed)
[name], [name] ([id number]) Visit Report for 09/27/2020 Arrival Information Details Patient Name: [name], [name]. Date of Service: 09/27/2020 2:45 PM Medical Record Number: [id number] Patient Account Number: [id number] Date of Birth/Sex: May 08, 1985 (35 y.o. F) Treating RN: Carlene Coria Primary Care Provider: Addison Naegeli Other Clinician: Referring Provider: Addison Naegeli Treating Provider/Extender: Skipper Cliche in Treatment: 9 Visit Information History Since Last Visit All ordered tests and consults were completed: No Patient Arrived: Ambulatory Added or deleted any medications: No Arrival Time: 15:27 Any new allergies or adverse reactions: No Accompanied By: self Had a fall or experienced change in No Transfer Assistance: None activities of daily living that may affect Patient Identification Verified: Yes risk of falls: Secondary Verification Process Completed: Yes Signs or symptoms of abuse/neglect since last visito No Patient Requires Transmission-Based Precautions: No Hospitalized since last visit: No Patient Has Alerts: No Implantable device outside of the clinic excluding No cellular tissue based products placed in the center since last visit: Has Dressing in Place as Prescribed: Yes Pain Present Now: No Electronic Signature(s) Signed: 09/28/2020 3:34:38 PM By: Carlene Coria RN Entered By: Carlene Coria on 09/27/2020 15:27:34 Kita, Herminio Heads ([id number]) -------------------------------------------------------------------------------- Clinic Level of Care Assessment Details Patient Name: [name], [name] Date of Service: 09/27/2020 2:45 PM Medical Record Number: [id number] Patient Account Number: [id number] Date of Birth/Sex: [birth year] (35 y.o. F) Treating RN: Dolan Amen Primary Care Provider: Addison Naegeli Other Clinician: Referring Provider: Addison Naegeli Treating Provider/Extender: Skipper Cliche in Treatment: 9 Clinic Level of Care Assessment  Items TOOL 4 Quantity Score X - Use when only an EandM is performed on FOLLOW-UP visit 1 0 ASSESSMENTS - Nursing Assessment / Reassessment X - Reassessment of Co-morbidities (includes updates in patient status) 1 10 X- 1 5 Reassessment of Adherence to Treatment Plan ASSESSMENTS - Wound and Skin Assessment / Reassessment X - Simple Wound Assessment / Reassessment - one wound 1 5 [] - 0 Complex Wound Assessment / Reassessment - multiple wounds [] - 0 Dermatologic / Skin Assessment (not related to wound area) ASSESSMENTS - Focused Assessment [] - Circumferential Edema Measurements - multi extremities 0 [] - 0 Nutritional Assessment / Counseling / Intervention [] - 0 Lower Extremity Assessment (monofilament, tuning fork, pulses) [] - 0 Peripheral Arterial Disease Assessment (using hand held doppler) ASSESSMENTS - Ostomy and/or Continence Assessment and Care [] - Incontinence Assessment and Management 0 [] - 0 Ostomy Care Assessment and Management (repouching, etc.) PROCESS - Coordination of Care X - Simple Patient / Family Education for ongoing care 1 15 [] - 0 Complex (extensive) Patient / Family Education for ongoing care [] - 0 Staff obtains Programmer, systems, Records, Test Results / Process Orders [] - 0 Staff telephones HHA, Nursing Homes / Clarify orders / etc [] - 0 Routine Transfer to another Facility (non-emergent condition) [] - 0 Routine Hospital Admission (non-emergent condition) [] - 0 New Admissions / Biomedical engineer / Ordering NPWT, Apligraf, etc. [] - 0 Emergency Hospital Admission (emergent condition) X- 1 10 Simple Discharge Coordination [] - 0 Complex (extensive) Discharge Coordination PROCESS - Special Needs [] - Pediatric / Minor Patient Management 0 [] - 0 Isolation Patient Management [] - 0 Hearing / Language / Visual special needs [] - 0 Assessment of Community assistance (transportation, D/C planning, etc.) [] - 0 Additional assistance /  Altered mentation [] - 0 Support Surface(s) Assessment (bed, cushion, seat, etc.) INTERVENTIONS - Wound Cleansing / Measurement Dangerfield, Micayla J. ([id number]) X- 1 5 Simple Wound Cleansing -  one wound [] - 0 Complex Wound Cleansing - multiple wounds X- 1 5 Wound Imaging (photographs - any number of wounds) [] - 0 Wound Tracing (instead of photographs) X- 1 5 Simple Wound Measurement - one wound [] - 0 Complex Wound Measurement - multiple wounds INTERVENTIONS - Wound Dressings [] - Small Wound Dressing one or multiple wounds 0 X- 1 15 Medium Wound Dressing one or multiple wounds [] - 0 Large Wound Dressing one or multiple wounds [] - 0 Application of Medications - topical [] - 0 Application of Medications - injection INTERVENTIONS - Miscellaneous [] - External ear exam 0 [] - 0 Specimen Collection (cultures, biopsies, blood, body fluids, etc.) [] - 0 Specimen(s) / Culture(s) sent or taken to Lab for analysis [] - 0 Patient Transfer (multiple staff / Civil Service fast streamer / Similar devices) [] - 0 Simple Staple / Suture removal (25 or less) [] - 0 Complex Staple / Suture removal (26 or more) [] - 0 Hypo / Hyperglycemic Management (close monitor of Blood Glucose) [] - 0 Ankle / Brachial Index (ABI) - do not check if billed separately X- 1 5 Vital Signs Has the patient been seen at the hospital within the last three years: Yes Total Score: 80 Level Of Care: New/Established - Level 3 Electronic Signature(s) Signed: 09/27/2020 4:32:42 PM By: Georges Mouse, Minus Breeding RN Entered By: Georges Mouse, Kenia on 09/27/2020 Pendleton, Herminio Heads ([id number]) -------------------------------------------------------------------------------- Encounter Discharge Information Details Patient Name: [name], [name]. Date of Service: 09/27/2020 2:45 PM Medical Record Number: [id number] Patient Account Number: [id number] Date of Birth/Sex: 12/04/1985 (35 y.o. F) Treating RN: Donnamarie Poag Primary Care Provider: Addison Naegeli Other Clinician: Referring Provider: Addison Naegeli Treating Provider/Extender: Skipper Cliche in Treatment: 9 Encounter Discharge Information Items Discharge Condition: Stable Ambulatory Status: Ambulatory Discharge Destination: Home Transportation: Private Auto Accompanied By: self Schedule Follow-up Appointment: Yes Clinical Summary of Care: Electronic Signature(s) Signed: 09/27/2020 4:26:52 PM By: Donnamarie Poag Entered By: Donnamarie Poag on 09/27/2020 16:26:52 Stormer, Herminio Heads ([id number]) -------------------------------------------------------------------------------- Lower Extremity Assessment Details Patient Name: [name], [name]. Date of Service: 09/27/2020 2:45 PM Medical Record Number: [id number] Patient Account Number: [id number] Date of Birth/Sex: [birth year] (35 y.o. F) Treating RN: Carlene Coria Primary Care Provider: Addison Naegeli Other Clinician: Referring Provider: Addison Naegeli Treating Provider/Extender: Skipper Cliche in Treatment: 9 Edema Assessment Assessed: [Left: No] [Right: No] Edema: [Left: Ye] [Right: s] Calf Left: Right: Point of Measurement: 31 cm From Medial Instep 69 cm Ankle Left: Right: Point of Measurement: 9 cm From Medial Instep 34 cm Vascular Assessment Pulses: Dorsalis Pedis Palpable: [Left:Yes] Electronic Signature(s) Signed: 09/28/2020 3:34:38 PM By: Carlene Coria RN Entered By: Carlene Coria on 09/27/2020 15:35:26 Dalpe, Herminio Heads ([id number]) -------------------------------------------------------------------------------- Multi Wound Chart Details Patient Name: JABREA, KALLSTROM Date of Service: 09/27/2020 2:45 PM Medical Record Number: [id number] Patient Account Number: [id number] Date of Birth/Sex: 1985/08/12 (35 y.o. F) Treating RN: Dolan Amen Primary Care Provider: Addison Naegeli Other Clinician: Referring Provider: Addison Naegeli Treating Provider/Extender: Skipper Cliche in  Treatment: 9 Vital Signs Height(in): 21 Pulse(bpm): 94 Weight(lbs): 388 Blood Pressure(mmHg): 106/78 Body Mass Index(BMI): 63 Temperature(F): 97.6 Respiratory Rate(breaths/min): 18 Photos: [N/A:N/A] Wound Location: Left, Distal, Medial Lower Leg N/A N/A Wounding Event: Gradually Appeared N/A N/A Primary Etiology: Venous Leg Ulcer N/A N/A Secondary Etiology: Lymphedema N/A N/A Comorbid History: Hypertension, Peripheral Venous N/A N/A Disease Date Acquired: 07/18/2020 N/A N/A Weeks of Treatment: 9 N/A N/A Wound Status: Open N/A N/A Measurements L x W x  D (cm) 0.5x0.5x0.2 N/A N/A Area (cm) : 0.196 N/A N/A Volume (cm) : 0.039 N/A N/A % Reduction in Area: -532.30% N/A N/A % Reduction in Volume: -1200.00% N/A N/A Classification: Full Thickness Without Exposed N/A N/A Support Structures Exudate Amount: Medium N/A N/A Exudate Type: Serosanguineous N/A N/A Exudate Color: red, brown N/A N/A Wound Margin: Flat and Intact N/A N/A Granulation Amount: Small (1-33%) N/A N/A Granulation Quality: Red N/A N/A Necrotic Amount: Large (67-100%) N/A N/A Exposed Structures: Fat Layer (Subcutaneous Tissue): N/A N/A Yes Fascia: No Tendon: No Muscle: No Joint: No Bone: No Epithelialization: None N/A N/A Treatment Notes Electronic Signature(s) Signed: 09/27/2020 4:32:42 PM By: Georges Mouse, Minus Breeding RN Entered By: Georges Mouse, Minus Breeding on 09/27/2020 15:39:44 Olguin, Herminio Heads ([id number]) -------------------------------------------------------------------------------- Multi-Disciplinary Care Plan Details Patient Name: VONZELLA, ALTHAUS. Date of Service: 09/27/2020 2:45 PM Medical Record Number: [id number] Patient Account Number: [id number] Date of Birth/Sex: 03/22/1986 (35 y.o. F) Treating RN: Dolan Amen Primary Care Provider: Addison Naegeli Other Clinician: Referring Provider: Addison Naegeli Treating Provider/Extender: Skipper Cliche in Treatment: 9 Active Inactive Wound/Skin  Impairment Nursing Diagnoses: Impaired tissue integrity Goals: Patient/caregiver will verbalize understanding of skin care regimen Date Initiated: 07/26/2020 Date Inactivated: 09/19/2020 Target Resolution Date: 07/26/2020 Goal Status: Met Ulcer/skin breakdown will have a volume reduction of 30% by week 4 Date Initiated: 07/26/2020 Date Inactivated: 09/19/2020 Target Resolution Date: 08/25/2020 Goal Status: Met Ulcer/skin breakdown will have a volume reduction of 50% by week 8 Date Initiated: 07/26/2020 Target Resolution Date: 09/25/2020 Goal Status: Active Ulcer/skin breakdown will have a volume reduction of 80% by week 12 Date Initiated: 07/26/2020 Target Resolution Date: 10/25/2020 Goal Status: Active Ulcer/skin breakdown will heal within 14 weeks Date Initiated: 07/26/2020 Target Resolution Date: 11/25/2020 Goal Status: Active Interventions: Assess patient/caregiver ability to obtain necessary supplies Assess patient/caregiver ability to perform ulcer/skin care regimen upon admission and as needed Assess ulceration(s) every visit Provide education on ulcer and skin care Treatment Activities: Referred to DME provider for dressing supplies : 07/26/2020 Skin care regimen initiated : 07/26/2020 Notes: Electronic Signature(s) Signed: 09/27/2020 4:32:42 PM By: Georges Mouse, Minus Breeding RN Entered By: Georges Mouse, Minus Breeding on 09/27/2020 15:39:33 Rago, Herminio Heads ([id number]) -------------------------------------------------------------------------------- Pain Assessment Details Patient Name: MIYO, AINA Date of Service: 09/27/2020 2:45 PM Medical Record Number: [id number] Patient Account Number: [id number] Date of Birth/Sex: [birth year] (35 y.o. F) Treating RN: Carlene Coria Primary Care Provider: Addison Naegeli Other Clinician: Referring Provider: Addison Naegeli Treating Provider/Extender: Skipper Cliche in Treatment: 9 Active Problems Location of Pain Severity and Description of  Pain Patient Has Paino No Site Locations Pain Management and Medication Current Pain Management: Electronic Signature(s) Signed: 09/28/2020 3:34:38 PM By: Carlene Coria RN Entered By: Carlene Coria on 09/27/2020 15:33:30 [place], Herminio Heads ([id number]) -------------------------------------------------------------------------------- Patient/Caregiver Education Details Patient Name: VERGIA, CHEA. Date of Service: 09/27/2020 2:45 PM Medical Record Number: [id number] Patient Account Number: [id number] Date of Birth/Gender: Oct 09, 1985 (35 y.o. F) Treating RN: Dolan Amen Primary Care Physician: Addison Naegeli Other Clinician: Referring Physician: Addison Naegeli Treating Physician/Extender: Skipper Cliche in Treatment: 9 Education Assessment Education Provided To: Patient Education Topics Provided Wound/Skin Impairment: Methods: Explain/Verbal Responses: State content correctly Electronic Signature(s) Signed: 09/27/2020 4:32:42 PM By: Georges Mouse, Minus Breeding RN Entered By: Georges Mouse, Minus Breeding on 09/27/2020 15:42:23 Savastano, Herminio Heads ([id number]) -------------------------------------------------------------------------------- Wound Assessment Details Patient Name: VEDANSHI, MASSARO. Date of Service: 09/27/2020 2:45 PM Medical Record Number: [id number] Patient Account Number: [id number] Date of Birth/Sex: 1985-07-31 (35 y.o. F) Treating RN: Carlene Coria Primary Care  Provider: Addison Naegeli Other Clinician: Referring Provider: Addison Naegeli Treating Provider/Extender: Skipper Cliche in Treatment: 9 Wound Status Wound Number: 3 Primary Etiology: Venous Leg Ulcer Wound Location: Left, Distal, Medial Lower Leg Secondary Etiology: Lymphedema Wounding Event: Gradually Appeared Wound Status: Open Date Acquired: 07/18/2020 Comorbid History: Hypertension, Peripheral Venous Disease Weeks Of Treatment: 9 Clustered Wound: No Photos Wound Measurements Length: (cm) 0.5 Width: (cm)  0.5 Depth: (cm) 0.2 Area: (cm) 0.196 Volume: (cm) 0.039 % Reduction in Area: -532.3% % Reduction in Volume: -1200% Epithelialization: None Tunneling: No Undermining: No Wound Description Classification: Full Thickness Without Exposed Support Structures Wound Margin: Flat and Intact Exudate Amount: Medium Exudate Type: Serosanguineous Exudate Color: red, brown Foul Odor After Cleansing: No Slough/Fibrino Yes Wound Bed Granulation Amount: Small (1-33%) Exposed Structure Granulation Quality: Red Fascia Exposed: No Necrotic Amount: Large (67-100%) Fat Layer (Subcutaneous Tissue) Exposed: Yes Tendon Exposed: No Muscle Exposed: No Joint Exposed: No Bone Exposed: No Treatment Notes Wound #3 (Lower Leg) Wound Laterality: Left, Medial, Distal Cleanser Soap and Water Discharge Instruction: Gently cleanse wound with antibacterial soap, rinse and pat dry prior to dressing wounds Peri-Wound Care NOHA, MILBERGER ([id number]) Topical Primary Dressing Iodosorb 40 (g) Discharge Instruction: Apply IodoSorb to wound bed only as directed. Secondary Dressing ABD Pad 5x9 (in/in) Discharge Instruction: Cover with ABD pad Secured With 23M ACE Elastic Bandage With VELCRO Brand Closure, 4 (in) Discharge Instruction: YCXK from ankle to calf-patient to adjust if ACE wrap falls Compression Wrap Compression Stockings Add-Ons Electronic Signature(s) Signed: 09/28/2020 3:34:38 PM By: Carlene Coria RN Entered By: Carlene Coria on 09/27/2020 15:34:21 Morken, Herminio Heads ([id number]) -------------------------------------------------------------------------------- Vitals Details Patient Name: [name] Date of Service: 09/27/2020 2:45 PM Medical Record Number: [id number] Patient Account Number: [id number] Date of Birth/Sex: [birth year] (35 y.o. F) Treating RN: Carlene Coria Primary Care Provider: Addison Naegeli Other Clinician: Referring Provider: Addison Naegeli Treating Provider/Extender:  Skipper Cliche in Treatment: 9 Vital Signs Time Taken: 15:27 Temperature (F): 97.6 Height (in): 66 Pulse (bpm): 94 Weight (lbs): 388 Respiratory Rate (breaths/min): 18 Body Mass Index (BMI): 62.6 Blood Pressure (mmHg): 106/78 Reference Range: 80 - 120 mg / dl Electronic Signature(s) Signed: 09/28/2020 3:34:38 PM By: Carlene Coria RN Entered By: Carlene Coria on 09/27/2020 15:29:03

## 2020-10-04 ENCOUNTER — Encounter: Payer: BC Managed Care – PPO | Admitting: Physician Assistant

## 2020-10-04 ENCOUNTER — Other Ambulatory Visit: Payer: Self-pay

## 2020-10-04 DIAGNOSIS — L98492 Non-pressure chronic ulcer of skin of other sites with fat layer exposed: Secondary | ICD-10-CM | POA: Diagnosis not present

## 2020-10-04 NOTE — Progress Notes (Addendum)
[name], [name] ([id number]) Visit Report for 10/04/2020 Chief Complaint Document Details Patient Name: [name], [name]. Date of Service: 10/04/2020 2:30 PM Medical Record Number: [id number] Patient Account Number: [id number] Date of Birth/Sex: Jan 30, 1986 (35 y.o. F) Treating RN: Rogers Blocker Primary Care Provider: Lenor Coffin Other Clinician: Referring Provider: Lenor Coffin Treating Provider/Extender: Rowan Blase in Treatment: 10 Information Obtained from: Patient Chief Complaint Left LE Ulcer Electronic Signature(s) Signed: 10/04/2020 3:08:39 PM By: Lenda Kelp PA-C Entered By: Lenda Kelp on 10/04/2020 15:08:39 Borchers, [name] ([id number]) -------------------------------------------------------------------------------- HPI Details Patient Name: [name], [name] Date of Service: 10/04/2020 2:30 PM Medical Record Number: [id number] Patient Account Number: [id number] Date of Birth/Sex: 1985/10/01 (35 y.o. F) Treating RN: Rogers Blocker Primary Care Provider: Lenor Coffin Other Clinician: Referring Provider: Lenor Coffin Treating Provider/Extender: Rowan Blase in Treatment: 10 History of Present Illness HPI Description: 35 year old patient who started with having ulcerations on the right lower leg on the lateral part of her ankle for about 2 weeks. She was seen in the ER at [facility] and advised to see the wound care for a consultation. No X-rays of workup was done during the ER visit and no prescription for any medications of compression wraps were given. the patient is not diabetic but does have hypertension and her medications have been reviewed by me. 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 The right and left greater saphenous vein was found to be tortuous. Deep venous system was  also not competent and there was reflux of more than 500 ms. She was then seen by Dr. Tawanna Cooler Early who recommended that the patient would not benefit from endovenous ablation and he had recommended vein stripping on the right side and multiple small phlebectomy procedures on the left side. the patient did not follow-up due to social economic reasons. She has not been wearing any compression stockings and has not taken any specific treatment for varicose veins for the last 3 years. 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 We have obtained some appointments to see the vascular surgeons by the end of the month and the patient would like to follow up with me at my [facility] on Wednesday, June 29. 10/14/2014 -- she could not see me yesterday in [place] and hence has come for a review today. She has a vascular workup to be done this afternoon at [facility]. She is doing fine otherwise. 10/22/2014 -- she was seen by Dr. [name] and he has recommended surgical removal of her right saphenous vein from distal thigh to saphenofemoral junction and stab phlebectomy's of multiple large tributary branches throughout her thigh and calf. This would be done under general anesthesia in the outpatient setting. 10/29/2014 -- she is trying to work on a surgical date and in the meanwhile we have got insurance clearance for Apligraf and we will start this next week. 7/22 2016 -- she is here for the first application of Apligraf. 11/19/2014 -- she is here for a second application of Apligraf 11/26/2014 -- she has done fine after her last application of Apligraf and is awaiting her surgery which is scheduled for August 31. 12/03/2014 -- she is doing fine and is here for her third application  of Apligraf. 12/21/2014 -- She had surgery on 12/15/2014 by Dr.Early who did #1 ligation and stripping of right great  saphenous vein from distal thigh to saphenofemoral junction, #2 stab phlebectomy of large tributary varicose veins in the thigh popliteal space and calf. She had an Ace wrap up to her groin and this was removed today and the Unna's boot was also removed. 12/28/2014 -- she is here for her fourth application of Apligraf. 01/06/2015 - he saw her vascular surgeon Dr. Donnetta Hutching who was pleased with her progress and he has confirmed that no surgical procedures could be attempted on the left side. 01/13/2015 -- her wound looks very good and she's been having no problems whatsoever. Readmission: 07/26/2020 upon evaluation today patient presents for initial inspection here in our clinic for a new issue with her left leg although she is previously been seen due to issues with the right leg back in 2016. At that time she was seeing Dr. Donnetta Hutching who is a vein/vascular specialist in [place]. He has since semiretired from what I understand. He is working out of [employer] I believe. Nonetheless she tells me at the time that there was really nothing to do for her left leg although the right leg was where they did most of the work. Subsequently she states that she is done fairly well until just in the past week where she had issues with bleeding from what appears to be varicose vein on the left leg medially. Unfortunately this has continued to be an issue although she tells me at first it was coming much more significantly Down quite a bit but nonetheless has not completely resolved. Every time she showers she notices that it starts to drain a little bit more. She does have a history of chronic venous insufficiency, lymphedema, varicose veins bilaterally, and obesity. 08/02/2020 upon evaluation today patient appears to be doing about the same in regards to the ulcer on her left leg. She has some eschar covering there is definitely some fluid collecting underneath unfortunately. With that being said she tells me she is  still having a tremendous amount of pain therefore she is really not able to allow me to clean this off very effectively to be perfectly honest. I think we need to try to soften this up 08/16/2020 upon evaluation today patient's wound is really not doing significantly better not really states about the same. She notes that the wrap just does not seem to be staying up very well at all unfortunately. No fevers, chills, nausea, vomiting, or diarrhea. She did cut it off once it starts to slide in order to alleviate some of the pressure from sliding Down. Fortunately there is no signs of active infection at this time which is great news. 08/23/2020 upon evaluation today patient appears to be doing well 08/23/2020 upon evaluation today patient appears to be doing well with regard to her wound all things considered. Fortunately there does not appear to be any signs of active infection at this time which is great news. She has been tolerating the dressing changes without complication and overall I am extremely pleased with where things stand at this point. She does have her appointment with vascular in [facility] on June 9. [name], [name] ([id number]) 08/30/2020 upon evaluation today patient actually appears to be doing decently well in regard to her wound. Fortunately there is no signs of active infection which is great news. Nonetheless I do believe that the patient is going require little bit of debridement if  she is okay with me attempting that today I think that will help clean off some of the necrotic tissue. Fortunately there does not appear to be otherwise any evidence of active infection which is also great news. 09/19/2020 upon evaluation today patient appears to be doing a little better in regard to her wound as compared to previous. Fortunately there does not appear to be any signs of active infection overall. No fever chills noted. I do believe that the Iodosorb has made this a little bit  better with regard to the overall size and appearance of the wound bed though again she does still have quite a ways to go to get this to heal she still very tender to touch. 09/27/2020 upon evaluation today patient appears to actually be doing quite well with regard to her wound. This is measuring much smaller which is great news. With that being said she did see vein and vascular in [facility] and they subsequently recommended that surgery is really what she probably needs to go forward with sounds like the potential for venous ablation. With that being said the patient tells me this is just not the right time for her to be able to proceed with any type of surgery which I completely understand. Nonetheless I do believe that she would continue to benefit from compression but again that is really not something that she is able to easily do. 10/04/2020 upon evaluation today patient appears to be doing about the same in regard to her wound. This is measuring a little bit smaller but nonetheless still is open and again has some slough and biofilm noted on the surface of the wound. There does not appear to be any signs of active infection which is great news. No fevers, chills, nausea, vomiting, or diarrhea. 10/04/2020 upon evaluation today patient appears to be doing well with regard to her wound. She has been tolerating dressing changes without complication. Fortunately there does not appear to be any signs of active infection which is great news. No fevers, chills, nausea, vomiting, or diarrhea. Electronic Signature(s) Signed: 10/04/2020 6:34:22 PM By: Lenda Kelp PA-C Entered By: Lenda Kelp on 10/04/2020 18:34:22 Rothlisberger, [name] ([id number]) -------------------------------------------------------------------------------- Physical Exam Details Patient Name: [name], [name]. Date of Service: 10/04/2020 2:30 PM Medical Record Number: [id number] Patient Account Number: [id number] Date of  Birth/Sex: 1985/12/09 (35 y.o. F) Treating RN: Rogers Blocker Primary Care Provider: Lenor Coffin Other Clinician: Referring Provider: Lenor Coffin Treating Provider/Extender: Rowan Blase in Treatment: 10 Constitutional Well-nourished and well-hydrated in no acute distress. Respiratory normal breathing without difficulty. Psychiatric this patient is able to make decisions and demonstrates good insight into disease process. Alert and Oriented x 3. pleasant and cooperative. Notes Upon inspection patient's wound bed actually showed signs of good granulation epithelization at this point in some areas although she did have some slough on the surface of the wound she did not want me to perform any sharp debridement due to discomfort which I completely understand. For that reason that was avoided today. Electronic Signature(s) Signed: 10/04/2020 6:34:45 PM By: Lenda Kelp PA-C Entered By: Lenda Kelp on 10/04/2020 18:34:45 Burmester, [name] ([id number]) -------------------------------------------------------------------------------- Physician Orders Details Patient Name: [name], [name] Date of Service: 10/04/2020 2:30 PM Medical Record Number: [id number] Patient Account Number: [id number] Date of Birth/Sex: January 27, 1986 (35 y.o. F) Treating RN: Rogers Blocker Primary Care Provider: Lenor Coffin Other Clinician: Referring Provider: Lenor Coffin Treating Provider/Extender: Rowan Blase in Treatment: 10 Verbal / Phone  Orders: No Diagnosis Coding ICD-10 Coding Code Description I87.2 Venous insufficiency (chronic) (peripheral) I89.0 Lymphedema, not elsewhere classified I83.893 Varicose veins of bilateral lower extremities with other complications L98.492 Non-pressure chronic ulcer of skin of other sites with fat layer exposed E66.01 Morbid (severe) obesity due to excess calories Follow-up Appointments o Return Appointment in 1 week. Bathing/ Shower/ Hygiene o Wash  wounds with antibacterial soap and water. Edema Control - Lymphedema / Segmental Compressive Device / Other Left Lower Extremity o Elevate, Exercise Daily and Avoid Standing for Long Periods of Time. o Elevate legs to the level of the heart and pump ankles as often as possible o Elevate leg(s) parallel to the floor when sitting. Wound Treatment Wound #3 - Lower Leg Wound Laterality: Left, Medial, Distal Cleanser: Soap and Water 3 x Per Week/15 Days Discharge Instructions: Gently cleanse wound with antibacterial soap, rinse and pat dry prior to dressing wounds Primary Dressing: Iodosorb 40 (g) (Generic) 3 x Per Week/15 Days Discharge Instructions: Apply IodoSorb to wound bed only as directed. Secondary Dressing: ABD Pad 5x9 (in/in) (Generic) 3 x Per Week/15 Days Discharge Instructions: Cover with ABD pad Secured With: 49M ACE Elastic Bandage With VELCRO Brand Closure, 4 (in) (Generic) 3 x Per Week/15 Days Discharge Instructions: Wrap from ankle to calf-patient to adjust if ACE wrap falls Electronic Signature(s) Signed: 10/04/2020 4:15:25 PM By: Phillis Haggis, Dondra Prader RN Signed: 10/04/2020 7:32:25 PM By: Lenda Kelp PA-C Entered By: Phillis Haggis, Dondra Prader on 10/04/2020 15:35:09 Vandalen, [name] ([id number]) -------------------------------------------------------------------------------- Problem List Details Patient Name: [name], [name]. Date of Service: 10/04/2020 2:30 PM Medical Record Number: [id number] Patient Account Number: [id number] Date of Birth/Sex: Dec 09, 1985 (34 y.o. F) Treating RN: Rogers Blocker Primary Care Provider: Lenor Coffin Other Clinician: Referring Provider: Lenor Coffin Treating Provider/Extender: Rowan Blase in Treatment: 10 Active Problems ICD-10 Encounter Code Description Active Date MDM Diagnosis I87.2 Venous insufficiency (chronic) (peripheral) 07/26/2020 No Yes I89.0 Lymphedema, not elsewhere classified 07/26/2020 No Yes I83.893 Varicose  veins of bilateral lower extremities with other complications 07/26/2020 No Yes L98.492 Non-pressure chronic ulcer of skin of other sites with fat layer exposed 07/26/2020 No Yes E66.01 Morbid (severe) obesity due to excess calories 07/26/2020 No Yes Inactive Problems Resolved Problems Electronic Signature(s) Signed: 10/04/2020 3:08:30 PM By: Lenda Kelp PA-C Entered By: Lenda Kelp on 10/04/2020 15:08:30 Romanek, [name] ([id number]) -------------------------------------------------------------------------------- Progress Note Details Patient Name: [name] Date of Service: 10/04/2020 2:30 PM Medical Record Number: [id number] Patient Account Number: [id number] Date of Birth/Sex: September 14, 1985 (35 y.o. F) Treating RN: Rogers Blocker Primary Care Provider: Lenor Coffin Other Clinician: Referring Provider: Lenor Coffin Treating Provider/Extender: Rowan Blase in Treatment: 10 Subjective Chief Complaint Information obtained from Patient Left LE Ulcer History of Present Illness (HPI) 35 year old patient who started with having ulcerations on the right lower leg on the lateral part of her ankle for about 2 weeks. She was seen in the ER at [facility] and advised to see the wound care for a consultation. No X-rays of workup was done during the ER visit and no prescription for any medications of compression wraps were given. the patient is not diabetic but does have hypertension and her medications have been reviewed by me. 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 The right and left greater saphenous vein was found to  be tortuous. Deep venous system was also not competent and there was reflux of more than 500 ms. She was then seen by Dr. Tawanna Cooler Early who recommended that the patient would not benefit from  endovenous ablation and he had recommended vein stripping on the right side and multiple small phlebectomy procedures on the left side. the patient did not follow-up due to social economic reasons. She has not been wearing any compression stockings and has not taken any specific treatment for varicose veins for the last 3 years. 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 We have obtained some appointments to see the vascular surgeons by the end of the month and the patient would like to follow up with me at my [facility] on Wednesday, June 29. 10/14/2014 -- she could not see me yesterday in [place] and hence has come for a review today. She has a vascular workup to be done this afternoon at [facility]. She is doing fine otherwise. 10/22/2014 -- she was seen by Dr. [name] and he has recommended surgical removal of her right saphenous vein from distal thigh to saphenofemoral junction and stab phlebectomy's of multiple large tributary branches throughout her thigh and calf. This would be done under general anesthesia in the outpatient setting. 10/29/2014 -- she is trying to work on a surgical date and in the meanwhile we have got insurance clearance for Apligraf and we will start this next week. 7/22 2016 -- she is here for the first application of Apligraf. 11/19/2014 -- she is here for a second application of Apligraf 11/26/2014 -- she has done fine after her last application of Apligraf and is awaiting her surgery which is scheduled for August 31. 12/03/2014 -- she is doing fine and is here for her third application of Apligraf. 12/21/2014 -- She had surgery on 12/15/2014 by Dr.Early who did #1 ligation and stripping of right great saphenous vein from distal thigh to saphenofemoral junction, #2 stab phlebectomy of large tributary varicose veins in the thigh popliteal space and  calf. She had an Ace wrap up to her groin and this was removed today and the Unna's boot was also removed. 12/28/2014 -- she is here for her fourth application of Apligraf. 01/06/2015 - he saw her vascular surgeon Dr. Arbie Cookey who was pleased with her progress and he has confirmed that no surgical procedures could be attempted on the left side. 01/13/2015 -- her wound looks very good and she's been having no problems whatsoever. Readmission: 07/26/2020 upon evaluation today patient presents for initial inspection here in our clinic for a new issue with her left leg although she is previously been seen due to issues with the right leg back in 2016. At that time she was seeing Dr. Arbie Cookey who is a vein/vascular specialist in [place]. He has since semiretired from what I understand. He is working out of [employer] I believe. Nonetheless she tells me at the time that there was really nothing to do for her left leg although the right leg was where they did most of the work. Subsequently she states that she is done fairly well until just in the past week where she had issues with bleeding from what appears to be varicose vein on the left leg medially. Unfortunately this has continued to be an issue although she tells me at first it was coming much more significantly Down quite a bit but nonetheless  has not completely resolved. Every time she showers she notices that it starts to drain a little bit more. She does have a history of chronic venous insufficiency, lymphedema, varicose veins bilaterally, and obesity. 08/02/2020 upon evaluation today patient appears to be doing about the same in regards to the ulcer on her left leg. She has some eschar covering there is definitely some fluid collecting underneath unfortunately. With that being said she tells me she is still having a tremendous amount of pain therefore she is really not able to allow me to clean this off very effectively to be perfectly honest. I  think we need to try to soften this up 08/16/2020 upon evaluation today patient's wound is really not doing significantly better not really states about the same. She notes that the wrap just does not seem to be staying up very well at all unfortunately. No fevers, chills, nausea, vomiting, or diarrhea. She did cut it off once it starts to slide in order to alleviate some of the pressure from sliding Down. Fortunately there is no signs of active infection at this time which is great news. [name] SimmondsSUMMERS, [name] ([id number]) 08/23/2020 upon evaluation today patient appears to be doing well 08/23/2020 upon evaluation today patient appears to be doing well with regard to her wound all things considered. Fortunately there does not appear to be any signs of active infection at this time which is great news. She has been tolerating the dressing changes without complication and overall I am extremely pleased with where things stand at this point. She [facility]. 08/30/2020 upon evaluation today patient actually appears to be doing decently well in regard to her wound. Fortunately there is no signs of active infection which is great news. Nonetheless I do believe that the patient is going require little bit of debridement if she is okay with me attempting that today I think that will help clean off some of the necrotic tissue. Fortunately there does not appear to be otherwise any evidence of active infection which is also great news. 09/19/2020 upon evaluation today patient appears to be doing a little better in regard to her wound as compared to previous. Fortunately there does not appear to be any signs of active infection overall. No fever chills noted. I do believe that the Iodosorb has made this a little bit better with regard to the overall size and appearance of the wound bed though again she does still have quite a ways to go to get this to heal she  still very tender to touch. 09/27/2020 upon evaluation today patient appears to actually be doing quite well with regard to her wound. This is measuring much smaller which is great news. With that being said she did [facility] [place] [facility] and they subsequently recommended that surgery is really what she probably needs to go forward with sounds like the potential for venous ablation. With that being said the patient tells me this is just not the right time for her to be able to proceed with any type of surgery which I completely understand. Nonetheless I do believe that she would continue to benefit from compression but again that is really not something that she is able to easily do. 10/04/2020 upon evaluation today patient appears to be doing about the same in regard to her wound. This is measuring a little bit smaller but nonetheless still is open and again has some slough and biofilm  noted on the surface of the wound. There does not appear to be any signs of active infection which is great news. No fevers, chills, nausea, vomiting, or diarrhea. 10/04/2020 upon evaluation today patient appears to be doing well with regard to her wound. She has been tolerating dressing changes without complication. Fortunately there does not appear to be any signs of active infection which is great news. No fevers, chills, nausea, vomiting, or diarrhea. Objective Constitutional Well-nourished and well-hydrated in no acute distress. Vitals Time Taken: 3:20 PM, Height: 66 in, Weight: 388 lbs, BMI: 62.6, Temperature: 98.2 F, Pulse: 97 bpm, Respiratory Rate: 18 breaths/min, Blood Pressure: 106/67 mmHg. Respiratory normal breathing without difficulty. Psychiatric this patient is able to make decisions and demonstrates good insight into disease process. Alert and Oriented x 3. pleasant and cooperative. General Notes: Upon inspection patient's wound bed actually showed signs of good granulation  epithelization at this point in some areas although she did have some slough on the surface of the wound she did not want me to perform any sharp debridement due to discomfort which I completely understand. For that reason that was avoided today. Integumentary (Hair, Skin) Wound #3 status is Open. Original cause of wound was Gradually Appeared. The date acquired was: 07/18/2020. The wound has been in treatment 10 weeks. The wound is located on the Left,Distal,Medial Lower Leg. The wound measures 0.3cm length x 0.3cm width x 0.1cm depth; 0.071cm^2 area and 0.007cm^3 volume. There is Fat Layer (Subcutaneous Tissue) exposed. There is a medium amount of serosanguineous drainage noted. The wound margin is flat and intact. There is medium (34-66%) red granulation within the wound bed. There is a medium (34- 66%) amount of necrotic tissue within the wound bed including Adherent Slough. 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 Bathing/ Shower/ Hygiene: Wash wounds with antibacterial soap and water. Edema Control - Lymphedema / Segmental Compressive Device / Other: Elevate, Exercise Daily and Avoid Standing for Long Periods of Time. Elevate legs to the level of the heart and pump ankles as often as possible Elevate leg(s) parallel to the floor when sitting. WOUND #3: - Lower Leg Wound Laterality: Left, Medial, Distal Cleanser: Soap and Water 3 x Per Week/15 Days Discharge Instructions: Gently cleanse wound with antibacterial soap, rinse and pat dry prior to dressing wounds Primary Dressing: Iodosorb 40 (g) (Generic) 3 x Per Week/15 Days Discharge  Instructions: Apply IodoSorb to wound bed only as directed. Secondary Dressing: ABD Pad 5x9 (in/in) (Generic) 3 x Per Week/15 Days Discharge Instructions: Cover with ABD pad Secured With: 21M ACE Elastic Bandage With VELCRO Brand Closure, 4 (in) (Generic) 3 x Per Week/15 Days Discharge Instructions: Wrap from ankle to calf-patient to adjust if ACE wrap falls 1. Would recommend currently that we go ahead and initiate treatment with a continuation of the Iodosorb which I think is still probably the best way to go. 2. I am also can recommend that we have the patient continue to use an ABD pad to cover followed by a Ace wrap. 3. I did recommend she try to clean her leg a little bit more effectively in the shower. I think this can be beneficial and that she does have a lot of slough noted and really needs  to have this cleaned away in order to allow for effective granulation and epithelization. She voiced understanding. We will see patient back for reevaluation in 1 week here in the clinic. If anything worsens or changes patient will contact our office for additional recommendations. Electronic Signature(s) Signed: 10/04/2020 6:37:55 PM By: Lenda Kelp PA-C Entered By: Lenda Kelp on 10/04/2020 18:37:55 Delehanty, [name] ([id number]) -------------------------------------------------------------------------------- SuperBill Details Patient Name: [name], [name] Date of Service: 10/04/2020 Medical Record Number: [id number] Patient Account Number: [id number] Date of Birth/Sex: 20-Mar-1986 (35 y.o. F) Treating RN: Rogers Blocker Primary Care Provider: Lenor Coffin Other Clinician: Referring Provider: Lenor Coffin Treating Provider/Extender: Rowan Blase in Treatment: 10 Diagnosis Coding ICD-10 Codes Code Description I87.2 Venous insufficiency (chronic) (peripheral) I89.0 Lymphedema, not elsewhere classified I83.893 Varicose veins of bilateral lower extremities with other  complications L98.492 Non-pressure chronic ulcer of skin of other sites with fat layer exposed E66.01 Morbid (severe) obesity due to excess calories Facility Procedures CPT4 Code: 78295621 Description: 99213 - WOUND CARE VISIT-LEV 3 EST PT Modifier: Quantity: 1 Physician Procedures CPT4 Code: 3086578 Description: 99213 - WC PHYS LEVEL 3 - EST PT Modifier: Quantity: 1 CPT4 Code: Description: ICD-10 Diagnosis Description I87.2 Venous insufficiency (chronic) (peripheral) I89.0 Lymphedema, not elsewhere classified I83.893 Varicose veins of bilateral lower extremities with other complications L98.492 Non-pressure chronic ulcer of  skin of other sites with fat layer expos Modifier: ed Quantity: Electronic Signature(s) Signed: 10/04/2020 6:38:09 PM By: Lenda Kelp PA-C Previous Signature: 10/04/2020 4:15:25 PM Version By: Phillis Haggis, Dondra Prader RN Entered By: Lenda Kelp on 10/04/2020 18:38:09

## 2020-10-04 NOTE — Progress Notes (Addendum)
[name], [name] ([id number]) Visit Report for 10/04/2020 Arrival Information Details Patient Name: [name], [name]. Date of Service: 10/04/2020 2:30 PM Medical Record Number: [id number] Patient Account Number: [id number] Date of Birth/Sex: 11/22/1985 (35 y.o. F) Treating RN: Carlene Coria Primary Care Isaic Syler: Addison Naegeli Other Clinician: Referring Anais Denslow: Addison Naegeli Treating Braylyn Eye/Extender: Skipper Cliche in Treatment: 10 Visit Information History Since Last Visit All ordered tests and consults were completed: No Patient Arrived: Ambulatory Added or deleted any medications: No Arrival Time: 15:12 Any new allergies or adverse reactions: No Accompanied By: self Had a fall or experienced change in No Transfer Assistance: None activities of daily living that may affect Patient Identification Verified: Yes risk of falls: Secondary Verification Process Completed: Yes Signs or symptoms of abuse/neglect since last visito No Patient Requires Transmission-Based Precautions: No Hospitalized since last visit: No Patient Has Alerts: No Implantable device outside of the clinic excluding No cellular tissue based products placed in the center since last visit: Has Dressing in Place as Prescribed: Yes Pain Present Now: No Electronic Signature(s) Signed: 10/05/2020 4:27:57 PM By: Carlene Coria RN Entered By: Carlene Coria on 10/04/2020 15:18:51 Givler, [name] ([id number]) -------------------------------------------------------------------------------- Clinic Level of Care Assessment Details Patient Name: [name], [name] Date of Service: 10/04/2020 2:30 PM Medical Record Number: [id number] Patient Account Number: [id number] Date of Birth/Sex: 1986-02-22 (35 y.o. F) Treating RN: Dolan Amen Primary Care Jasten Guyette: Addison Naegeli Other Clinician: Referring Kaiyon Hynes: Addison Naegeli Treating Halli Equihua/Extender: Skipper Cliche in Treatment: 10 Clinic Level of Care Assessment  Items TOOL 4 Quantity Score X - Use when only an EandM is performed on FOLLOW-UP visit 1 0 ASSESSMENTS - Nursing Assessment / Reassessment X - Reassessment of Co-morbidities (includes updates in patient status) 1 10 X- 1 5 Reassessment of Adherence to Treatment Plan ASSESSMENTS - Wound and Skin Assessment / Reassessment X - Simple Wound Assessment / Reassessment - one wound 1 5 []  - 0 Complex Wound Assessment / Reassessment - multiple wounds []  - 0 Dermatologic / Skin Assessment (not related to wound area) ASSESSMENTS - Focused Assessment []  - Circumferential Edema Measurements - multi extremities 0 []  - 0 Nutritional Assessment / Counseling / Intervention []  - 0 Lower Extremity Assessment (monofilament, tuning fork, pulses) []  - 0 Peripheral Arterial Disease Assessment (using hand held doppler) ASSESSMENTS - Ostomy and/or Continence Assessment and Care []  - Incontinence Assessment and Management 0 []  - 0 Ostomy Care Assessment and Management (repouching, etc.) PROCESS - Coordination of Care X - Simple Patient / Family Education for ongoing care 1 15 []  - 0 Complex (extensive) Patient / Family Education for ongoing care []  - 0 Staff obtains Programmer, systems, Records, Test Results / Process Orders []  - 0 Staff telephones HHA, Nursing Homes / Clarify orders / etc []  - 0 Routine Transfer to another Facility (non-emergent condition) []  - 0 Routine Hospital Admission (non-emergent condition) []  - 0 New Admissions / Biomedical engineer / Ordering NPWT, Apligraf, etc. []  - 0 Emergency Hospital Admission (emergent condition) X- 1 10 Simple Discharge Coordination []  - 0 Complex (extensive) Discharge Coordination PROCESS - Special Needs []  - Pediatric / Minor Patient Management 0 []  - 0 Isolation Patient Management []  - 0 Hearing / Language / Visual special needs []  - 0 Assessment of Community assistance (transportation, D/C planning, etc.) []  - 0 Additional assistance /  Altered mentation []  - 0 Support Surface(s) Assessment (bed, cushion, seat, etc.) INTERVENTIONS - Wound Cleansing / Measurement Tahir, Arnesha J. ([id number]) X- 1 5 Simple Wound Cleansing -  one wound []  - 0 Complex Wound Cleansing - multiple wounds X- 1 5 Wound Imaging (photographs - any number of wounds) []  - 0 Wound Tracing (instead of photographs) X- 1 5 Simple Wound Measurement - one wound []  - 0 Complex Wound Measurement - multiple wounds INTERVENTIONS - Wound Dressings []  - Small Wound Dressing one or multiple wounds 0 X- 1 15 Medium Wound Dressing one or multiple wounds []  - 0 Large Wound Dressing one or multiple wounds []  - 0 Application of Medications - topical []  - 0 Application of Medications - injection INTERVENTIONS - Miscellaneous []  - External ear exam 0 []  - 0 Specimen Collection (cultures, biopsies, blood, body fluids, etc.) []  - 0 Specimen(s) / Culture(s) sent or taken to Lab for analysis []  - 0 Patient Transfer (multiple staff / Civil Service fast streamer / Similar devices) []  - 0 Simple Staple / Suture removal (25 or less) []  - 0 Complex Staple / Suture removal (26 or more) []  - 0 Hypo / Hyperglycemic Management (close monitor of Blood Glucose) []  - 0 Ankle / Brachial Index (ABI) - do not check if billed separately X- 1 5 Vital Signs Has the patient been seen at the hospital within the last three years: Yes Total Score: 80 Level Of Care: New/Established - Level 3 Electronic Signature(s) Signed: 10/04/2020 4:15:25 PM By: Georges Mouse, Minus Breeding RN Entered By: Georges Mouse, Kenia on 10/04/2020 15:35:52 Drost, [name] ([id number]) -------------------------------------------------------------------------------- Encounter Discharge Information Details Patient Name: [name], [name]. Date of Service: 10/04/2020 2:30 PM Medical Record Number: [id number] Patient Account Number: [id number] Date of Birth/Sex: [birth year] (35 y.o. F) Treating RN: Dolan Amen Primary Care Dragon Thrush: Addison Naegeli Other Clinician: Referring Tamas Suen: Addison Naegeli Treating Khrista Braun/Extender: Skipper Cliche in Treatment: 10 Encounter Discharge Information Items Discharge Condition: Stable Ambulatory Status: Ambulatory Discharge Destination: Home Transportation: Private Auto Accompanied By: self Schedule Follow-up Appointment: Yes Clinical Summary of Care: Electronic Signature(s) Signed: 10/04/2020 4:23:36 PM By: Jeanine Luz Entered By: Jeanine Luz on 10/04/2020 15:48:09 [place], [name] ([id number]) -------------------------------------------------------------------------------- Lower Extremity Assessment Details Patient Name: [name], [name] Date of Service: 10/04/2020 2:30 PM Medical Record Number: [id number] Patient Account Number: [id number] Date of Birth/Sex: [birth year] (35 y.o. F) Treating RN: Carlene Coria Primary Care Renlee Floor: Addison Naegeli Other Clinician: Referring Breeley Bischof: Addison Naegeli Treating Algis Lehenbauer/Extender: Jeri Cos Weeks in Treatment: 10 Edema Assessment Assessed: Shirlyn Goltz: No] [Right: No] Edema: [Left: Ye] [Right: s] Calf Left: Right: Point of Measurement: 31 cm From Medial Instep 66 cm Ankle Left: Right: Point of Measurement: 9 cm From Medial Instep 33 cm Vascular Assessment Pulses: Dorsalis Pedis Palpable: [Left:Yes] Electronic Signature(s) Signed: 10/05/2020 4:27:57 PM By: Carlene Coria RN Entered By: Carlene Coria on 10/04/2020 15:27:29 Fossum, [name] ([id number]) -------------------------------------------------------------------------------- Multi Wound Chart Details Patient Name: [name], [name] Date of Service: 10/04/2020 2:30 PM Medical Record Number: [id number] Patient Account Number: [id number] Date of Birth/Sex: 03/04/1986 (35 y.o. F) Treating RN: Dolan Amen Primary Care Asna Muldrow: Addison Naegeli Other Clinician: Referring Vieno Tarrant: Addison Naegeli Treating Harjit Douds/Extender: Skipper Cliche in Treatment: 10 Vital Signs Height(in): 59 Pulse(bpm): 97 Weight(lbs): 388 Blood Pressure(mmHg): 106/67 Body Mass Index(BMI): 63 Temperature(F): 98.2 Respiratory Rate(breaths/min): 18 Photos: [N/A:N/A] Wound Location: Left, Distal, Medial Lower Leg N/A N/A Wounding Event: Gradually Appeared N/A N/A Primary Etiology: Venous Leg Ulcer N/A N/A Secondary Etiology: Lymphedema N/A N/A Comorbid History: Hypertension, Peripheral Venous N/A N/A Disease Date Acquired: 07/18/2020 N/A N/A Weeks of Treatment: 10 N/A N/A Wound Status: Open N/A N/A Measurements L x W x  D (cm) 0.3x0.3x0.1 N/A N/A Area (cm) : 0.071 N/A N/A Volume (cm) : 0.007 N/A N/A % Reduction in Area: -129.00% N/A N/A % Reduction in Volume: -133.30% N/A N/A Classification: Full Thickness Without Exposed N/A N/A Support Structures Exudate Amount: Medium N/A N/A Exudate Type: Serosanguineous N/A N/A Exudate Color: red, brown N/A N/A Wound Margin: Flat and Intact N/A N/A Granulation Amount: Medium (34-66%) N/A N/A Granulation Quality: Red N/A N/A Necrotic Amount: Medium (34-66%) N/A N/A Exposed Structures: Fat Layer (Subcutaneous Tissue): N/A N/A Yes Fascia: No Tendon: No Muscle: No Joint: No Bone: No Epithelialization: None N/A N/A Treatment Notes Electronic Signature(s) Signed: 10/04/2020 4:15:25 PM By: Georges Mouse, Minus Breeding RN Entered By: Georges Mouse, Minus Breeding on 10/04/2020 15:34:37 Fluckiger, [name] ([id number]) -------------------------------------------------------------------------------- [place] Details Patient Name: [name], [name]. Date of Service: 10/04/2020 2:30 PM Medical Record Number: [id number] Patient Account Number: [id number] Date of Birth/Sex: 1985/12/13 (35 y.o. F) Treating RN: Dolan Amen Primary Care Shivan Hodes: Addison Naegeli Other Clinician: Referring Ramya Vanbergen: Addison Naegeli Treating Lira Stephen/Extender: Skipper Cliche in Treatment: 10 Active  Inactive Wound/Skin Impairment Nursing Diagnoses: Impaired tissue integrity Goals: Patient/caregiver will verbalize understanding of skin care regimen Date Initiated: 07/26/2020 Date Inactivated: 09/19/2020 Target Resolution Date: 07/26/2020 Goal Status: Met Ulcer/skin breakdown will have a volume reduction of 30% by week 4 Date Initiated: 07/26/2020 Date Inactivated: 09/19/2020 Target Resolution Date: 08/25/2020 Goal Status: Met Ulcer/skin breakdown will have a volume reduction of 50% by week 8 Date Initiated: 07/26/2020 Target Resolution Date: 09/25/2020 Goal Status: Active Ulcer/skin breakdown will have a volume reduction of 80% by week 12 Date Initiated: 07/26/2020 Target Resolution Date: 10/25/2020 Goal Status: Active Ulcer/skin breakdown will heal within 14 weeks Date Initiated: 07/26/2020 Target Resolution Date: 11/25/2020 Goal Status: Active Interventions: Assess patient/caregiver ability to obtain necessary supplies Assess patient/caregiver ability to perform ulcer/skin care regimen upon admission and as needed Assess ulceration(s) every visit Provide education on ulcer and skin care Treatment Activities: Referred to DME Kael Keetch for dressing supplies : 07/26/2020 Skin care regimen initiated : 07/26/2020 Notes: Electronic Signature(s) Signed: 10/04/2020 4:15:25 PM By: Georges Mouse, Minus Breeding RN Entered By: Georges Mouse, Minus Breeding on 10/04/2020 15:34:26 [place], [name] ([id number]) -------------------------------------------------------------------------------- Pain Assessment Details Patient Name: [name], [name] Date of Service: 10/04/2020 2:30 PM Medical Record Number: [id number] Patient Account Number: [id number] Date of Birth/Sex: 02/22/1986 (35 y.o. F) Treating RN: Carlene Coria Primary Care Proctor Carriker: Addison Naegeli Other Clinician: Referring Ramone Gander: Addison Naegeli Treating Semaj Coburn/Extender: Skipper Cliche in Treatment: 10 Active Problems Location of Pain Severity  and Description of Pain Patient Has Paino No Site Locations Pain Management and Medication Current Pain Management: Electronic Signature(s) Signed: 10/05/2020 4:27:57 PM By: Carlene Coria RN Entered By: Carlene Coria on 10/04/2020 15:19:12 Palin, [name] ([id number]) -------------------------------------------------------------------------------- Patient/Caregiver Education Details Patient Name: [name], [name]. Date of Service: 10/04/2020 2:30 PM Medical Record Number: [id number] Patient Account Number: [id number] Date of Birth/Gender: [birth year] (35 y.o. F) Treating RN: Dolan Amen Primary Care Physician: Addison Naegeli Other Clinician: Referring Physician: Addison Naegeli Treating Physician/Extender: Skipper Cliche in Treatment: 10 Education Assessment Education Provided To: Patient Education Topics Provided Wound/Skin Impairment: Methods: Explain/Verbal Responses: State content correctly Electronic Signature(s) Signed: 10/04/2020 4:15:25 PM By: Georges Mouse, Minus Breeding RN Entered By: Georges Mouse, Minus Breeding on 10/04/2020 15:36:07 [name], [name] ([id number]) -------------------------------------------------------------------------------- Wound Assessment Details Patient Name: [name], [name]. Date of Service: 10/04/2020 2:30 PM Medical Record Number: [id number] Patient Account Number: [id number] Date of Birth/Sex: 1985-05-11 (35 y.o. F) Treating RN: Carlene Coria Primary Care  Ketzia Guzek: Addison Naegeli Other Clinician: Referring Dim Meisinger: Addison Naegeli Treating Sharronda Schweers/Extender: Jeri Cos Weeks in Treatment: 10 Wound Status Wound Number: 3 Primary Etiology: Venous Leg Ulcer Wound Location: Left, Distal, Medial Lower Leg Secondary Etiology: Lymphedema Wounding Event: Gradually Appeared Wound Status: Open Date Acquired: 07/18/2020 Comorbid History: Hypertension, Peripheral Venous Disease Weeks Of Treatment: 10 Clustered Wound: No Photos Wound Measurements Length: (cm)  0.3 Width: (cm) 0.3 Depth: (cm) 0.1 Area: (cm) 0.071 Volume: (cm) 0.007 % Reduction in Area: -129% % Reduction in Volume: -133.3% Epithelialization: None Wound Description Classification: Full Thickness Without Exposed Support Structures Wound Margin: Flat and Intact Exudate Amount: Medium Exudate Type: Serosanguineous Exudate Color: red, brown Foul Odor After Cleansing: No Slough/Fibrino Yes Wound Bed Granulation Amount: Medium (34-66%) Exposed Structure Granulation Quality: Red Fascia Exposed: No Necrotic Amount: Medium (34-66%) Fat Layer (Subcutaneous Tissue) Exposed: Yes Necrotic Quality: Adherent Slough Tendon Exposed: No Muscle Exposed: No Joint Exposed: No Bone Exposed: No Treatment Notes Wound #3 (Lower Leg) Wound Laterality: Left, Medial, Distal Cleanser Soap and Water Discharge Instruction: Gently cleanse wound with antibacterial soap, rinse and pat dry prior to dressing wounds Peri-Wound Care [name], [name] ([id number]) Topical Primary Dressing Iodosorb 40 (g) Discharge Instruction: Apply IodoSorb to wound bed only as directed. Secondary Dressing ABD Pad 5x9 (in/in) Discharge Instruction: Cover with ABD pad Secured With 64M ACE Elastic Bandage With VELCRO Brand Closure, 4 (in) Discharge Instruction: ZHGD from ankle to calf-patient to adjust if ACE wrap falls Compression Wrap Compression Stockings Add-Ons Electronic Signature(s) Signed: 10/05/2020 4:27:57 PM By: Carlene Coria RN Entered By: Carlene Coria on 10/04/2020 15:27:05 Schickling, [name] ([id number]) -------------------------------------------------------------------------------- Vitals Details Patient Name: [name] Date of Service: 10/04/2020 2:30 PM Medical Record Number: [id number] Patient Account Number: [id number] Date of Birth/Sex: [birth year] (35 y.o. F) Treating RN: Carlene Coria Primary Care Jaskaran Dauzat: Addison Naegeli Other Clinician: Referring Jilene Spohr: Addison Naegeli Treating  Brenna Friesenhahn/Extender: Skipper Cliche in Treatment: 10 Vital Signs Time Taken: 15:20 Temperature (F): 98.2 Height (in): 66 Pulse (bpm): 97 Weight (lbs): 388 Respiratory Rate (breaths/min): 18 Body Mass Index (BMI): 62.6 Blood Pressure (mmHg): 106/67 Reference Range: 80 - 120 mg / dl Electronic Signature(s) Signed: 10/05/2020 4:27:57 PM By: Carlene Coria RN Entered By: Carlene Coria on 10/04/2020 15:19:07

## 2020-10-11 ENCOUNTER — Ambulatory Visit: Payer: BC Managed Care – PPO | Admitting: Physician Assistant

## 2020-10-25 ENCOUNTER — Other Ambulatory Visit: Payer: Self-pay

## 2020-10-25 ENCOUNTER — Encounter: Payer: BC Managed Care – PPO | Attending: Physician Assistant | Admitting: Physician Assistant

## 2020-10-25 DIAGNOSIS — Z6841 Body Mass Index (BMI) 40.0 and over, adult: Secondary | ICD-10-CM | POA: Diagnosis not present

## 2020-10-25 DIAGNOSIS — I83893 Varicose veins of bilateral lower extremities with other complications: Secondary | ICD-10-CM | POA: Diagnosis not present

## 2020-10-25 DIAGNOSIS — L98492 Non-pressure chronic ulcer of skin of other sites with fat layer exposed: Secondary | ICD-10-CM | POA: Diagnosis not present

## 2020-10-25 DIAGNOSIS — I890 Lymphedema, not elsewhere classified: Secondary | ICD-10-CM | POA: Diagnosis not present

## 2020-10-25 DIAGNOSIS — I872 Venous insufficiency (chronic) (peripheral): Secondary | ICD-10-CM | POA: Insufficient documentation

## 2020-10-25 DIAGNOSIS — I10 Essential (primary) hypertension: Secondary | ICD-10-CM | POA: Diagnosis not present

## 2020-10-25 NOTE — Progress Notes (Addendum)
[name] ([id number]) Visit Report for 10/25/2020 Chief Complaint Document Details Patient Name: [name], [name]. Date of Service: 10/25/2020 12:45 PM Medical Record Number: [id number] Patient Account Number: [id number] Date of Birth/Sex: Feb 28, 1986 (35 y.o. F) Treating RN: Rogers Blocker Primary Care Provider: Derrel Nip Other Clinician: Referring Provider: Derrel Nip Treating Provider/Extender: Rowan Blase in Treatment: 13 Information Obtained from: Patient Chief Complaint Left LE Ulcer Electronic Signature(s) Signed: 10/25/2020 1:29:39 PM By: Lenda Kelp PA-C Entered By: Lenda Kelp on 10/25/2020 13:29:39 [name] ([id number]) -------------------------------------------------------------------------------- HPI Details Patient Name: [name], [name] Date of Service: 10/25/2020 12:45 PM Medical Record Number: [id number] Patient Account Number: [id number] Date of Birth/Sex: Nov 19, 1985 (35 y.o. F) Treating RN: Rogers Blocker Primary Care Provider: Derrel Nip Other Clinician: Referring Provider: Derrel Nip Treating Provider/Extender: Rowan Blase in Treatment: 13 History of Present Illness HPI Description: 35 year old patient who started with having ulcerations on the right lower leg on the lateral part of her ankle for about 2 weeks. She was seen in the ER at [facility] and advised to see the wound care for a consultation. No X-rays of workup was done during the ER visit and no prescription for any medications of compression wraps were given. the patient is not diabetic but does have hypertension and her medications have been reviewed by me. 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 The right and left greater saphenous vein was found to be tortuous. Deep venous  system was also not competent and there was reflux of more than 500 ms. She was then seen by Dr. Tawanna Cooler Early who recommended that the patient would not benefit from endovenous ablation and he had recommended vein stripping on the right side and multiple small phlebectomy procedures on the left side. the patient did not follow-up due to social economic reasons. She has not been wearing any compression stockings and has not taken any specific treatment for varicose veins for the last 3 years. 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 We have obtained some appointments to see the vascular surgeons by the end of the month and the patient would like to follow up with me at my [facility] on Wednesday, June 29. 10/14/2014 -- she could not see me yesterday in [place] and hence has come for a review today. She has a vascular workup to be done this afternoon at [facility]. She is doing fine otherwise. 10/22/2014 -- she was seen by Dr. [name] and he has recommended surgical removal of her right saphenous vein from distal thigh to saphenofemoral junction and stab phlebectomy's of multiple large tributary branches throughout her thigh and calf. This would be done under general anesthesia in the outpatient setting. 10/29/2014 -- she is trying to work on a surgical date and in the meanwhile we have got insurance clearance for Apligraf and we will start this next week. 7/22 2016 -- she is here for the first application of Apligraf. 11/19/2014 -- she is here for a second application of Apligraf 11/26/2014 -- she has done fine after her last application of Apligraf and is awaiting her surgery which is scheduled for August 31. 12/03/2014 -- she is doing fine and is here for her third application  of Apligraf. 12/21/2014 -- She had surgery on 12/15/2014 by Dr.Early who did #1 ligation and stripping of  right great saphenous vein from distal thigh to saphenofemoral junction, #2 stab phlebectomy of large tributary varicose veins in the thigh popliteal space and calf. She had an Ace wrap up to her groin and this was removed today and the Unna's boot was also removed. 12/28/2014 -- she is here for her fourth application of Apligraf. 01/06/2015 - he saw her vascular surgeon Dr. Arbie Cookey who was pleased with her progress and he has confirmed that no surgical procedures could be attempted on the left side. 01/13/2015 -- her wound looks very good and she's been having no problems whatsoever. Readmission: 07/26/2020 upon evaluation today patient presents for initial inspection here in our clinic for a new issue with her left leg although she is previously been seen due to issues with the right leg back in 2016. At that time she was seeing Dr. Arbie Cookey who is a vein/vascular specialist in [place]. He has since semiretired from what I understand. He is working out of [employer] I believe. Nonetheless she tells me at the time that there was really nothing to do for her left leg although the right leg was where they did most of the work. Subsequently she states that she is done fairly well until just in the past week where she had issues with bleeding from what appears to be varicose vein on the left leg medially. Unfortunately this has continued to be an issue although she tells me at first it was coming much more significantly Down quite a bit but nonetheless has not completely resolved. Every time she showers she notices that it starts to drain a little bit more. She does have a history of chronic venous insufficiency, lymphedema, varicose veins bilaterally, and obesity. 08/02/2020 upon evaluation today patient appears to be doing about the same in regards to the ulcer on her left leg. She has some eschar covering there is definitely some fluid collecting underneath unfortunately. With that being said she tells  me she is still having a tremendous amount of pain therefore she is really not able to allow me to clean this off very effectively to be perfectly honest. I think we need to try to soften this up 08/16/2020 upon evaluation today patient's wound is really not doing significantly better not really states about the same. She notes that the wrap just does not seem to be staying up very well at all unfortunately. No fevers, chills, nausea, vomiting, or diarrhea. She did cut it off once it starts to slide in order to alleviate some of the pressure from sliding Down. Fortunately there is no signs of active infection at this time which is great news. 08/23/2020 upon evaluation today patient appears to be doing well 08/23/2020 upon evaluation today patient appears to be doing well with regard to her wound all things considered. Fortunately there does not appear to be any signs of active infection at this time which is great news. She has been tolerating the dressing changes without complication and overall I am extremely pleased with where things stand at this point. She does have her appointment with vascular in [facility] on June 9. [name], [name] ([id number]) 08/30/2020 upon evaluation today patient actually appears to be doing decently well in regard to her wound. Fortunately there is no signs of active infection which is great news. Nonetheless I do believe that the patient is going require little bit of debridement if  she is okay with me attempting that today I think that will help clean off some of the necrotic tissue. Fortunately there does not appear to be otherwise any evidence of active infection which is also great news. 09/19/2020 upon evaluation today patient appears to be doing a little better in regard to her wound as compared to previous. Fortunately there does not appear to be any signs of active infection overall. No fever chills noted. I do believe that the Iodosorb has made this a little bit  better with regard to the overall size and appearance of the wound bed though again she does still have quite a ways to go to get this to heal she still very tender to touch. 09/27/2020 upon evaluation today patient appears to actually be doing quite well with regard to her wound. This is measuring much smaller which is great news. With that being said she did see vein and vascular in [facility] and they subsequently recommended that surgery is really what she probably needs to go forward with sounds like the potential for venous ablation. With that being said the patient tells me this is just not the right time for her to be able to proceed with any type of surgery which I completely understand. Nonetheless I do believe that she would continue to benefit from compression but again that is really not something that she is able to easily do. 10/04/2020 upon evaluation today patient appears to be doing about the same in regard to her wound. This is measuring a little bit smaller but nonetheless still is open and again has some slough and biofilm noted on the surface of the wound. There does not appear to be any signs of active infection which is great news. No fevers, chills, nausea, vomiting, or diarrhea. 10/04/2020 upon evaluation today patient appears to be doing well with regard to her wound. She has been tolerating dressing changes without complication. Fortunately there does not appear to be any signs of active infection which is great news. No fevers, chills, nausea, vomiting, or diarrhea. 10/25/2020 upon evaluation today patient appears to be doing well with regard to her wound. She has been tolerating the dressing changes without complication. Fortunately there is no signs of active infection at this time. No fevers, chills, nausea, vomiting, or diarrhea. Electronic Signature(s) Signed: 10/25/2020 1:46:49 PM By: Lenda Kelp PA-C Entered By: Lenda Kelp on 10/25/2020 13:46:49 [place],  [name] ([id number]) -------------------------------------------------------------------------------- Physical Exam Details Patient Name: [name], [name]. Date of Service: 10/25/2020 12:45 PM Medical Record Number: [id number] Patient Account Number: [id number] Date of Birth/Sex: Jan 12, 1986 (35 y.o. F) Treating RN: Rogers Blocker Primary Care Provider: Derrel Nip Other Clinician: Referring Provider: Derrel Nip Treating Provider/Extender: Rowan Blase in Treatment: 13 Constitutional Well-nourished and well-hydrated in no acute distress. Respiratory normal breathing without difficulty. Psychiatric this patient is able to make decisions and demonstrates good insight into disease process. Alert and Oriented x 3. pleasant and cooperative. Notes Upon inspection patient's wound bed actually showed signs of good granulation epithelization there is minimal slough noted I was able to clean this away with saline and gauze she tolerated that with some discomfort but nothing too bad. Overall very pleased with where things stand. Electronic Signature(s) Signed: 10/25/2020 1:48:19 PM By: Lenda Kelp PA-C Entered By: Lenda Kelp on 10/25/2020 13:48:19 Anacker, [name] ([id number]) -------------------------------------------------------------------------------- Physician Orders Details Patient Name: LEILENE, DIPRIMA Date of Service: 10/25/2020 12:45 PM Medical Record Number: [id number] Patient Account Number: [id number]  Date of Birth/Sex: [birth year] (34 y.o. F) Treating RN: Rogers Blocker Primary Care Provider: Derrel Nip Other Clinician: Referring Provider: Derrel Nip Treating Provider/Extender: Rowan Blase in Treatment: 13 Verbal / Phone Orders: No Diagnosis Coding ICD-10 Coding Code Description I87.2 Venous insufficiency (chronic) (peripheral) I89.0 Lymphedema, not elsewhere classified I83.893 Varicose veins of bilateral lower extremities with other  complications L98.492 Non-pressure chronic ulcer of skin of other sites with fat layer exposed E66.01 Morbid (severe) obesity due to excess calories Follow-up Appointments o Return Appointment in 1 week. Bathing/ Shower/ Hygiene o Wash wounds with antibacterial soap and water. Edema Control - Lymphedema / Segmental Compressive Device / Other Left Lower Extremity o Elevate, Exercise Daily and Avoid Standing for Long Periods of Time. o Elevate legs to the level of the heart and pump ankles as often as possible o Elevate leg(s) parallel to the floor when sitting. Wound Treatment Wound #3 - Lower Leg Wound Laterality: Left, Medial, Distal Cleanser: Soap and Water 3 x Per Week/15 Days Discharge Instructions: Gently cleanse wound with antibacterial soap, rinse and pat dry prior to dressing wounds Primary Dressing: Iodosorb 40 (g) (Generic) 3 x Per Week/15 Days Discharge Instructions: Apply IodoSorb to wound bed only as directed. Secondary Dressing: ABD Pad 5x9 (in/in) (Generic) 3 x Per Week/15 Days Discharge Instructions: Cover with ABD pad Secured With: 60M ACE Elastic Bandage With VELCRO Brand Closure, 4 (in) (Generic) 3 x Per Week/15 Days Discharge Instructions: Wrap from ankle to calf-patient to adjust if ACE wrap falls Electronic Signature(s) Signed: 10/25/2020 4:18:09 PM By: Rogers Blocker RN Signed: 10/25/2020 6:20:26 PM By: Lenda Kelp PA-C Entered By: Rogers Blocker on 10/25/2020 13:45:01 [name], [name] ([id number]) -------------------------------------------------------------------------------- Problem List Details Patient Name: [name], [name]. Date of Service: 10/25/2020 12:45 PM Medical Record Number: [id number] Patient Account Number: [id number] Date of Birth/Sex: 1985/11/02 (35 y.o. F) Treating RN: Rogers Blocker Primary Care Provider: Derrel Nip Other Clinician: Referring Provider: Derrel Nip Treating Provider/Extender: Rowan Blase in  Treatment: 13 Active Problems ICD-10 Encounter Code Description Active Date MDM Diagnosis I87.2 Venous insufficiency (chronic) (peripheral) 07/26/2020 No Yes I89.0 Lymphedema, not elsewhere classified 07/26/2020 No Yes I83.893 Varicose veins of bilateral lower extremities with other complications 07/26/2020 No Yes L98.492 Non-pressure chronic ulcer of skin of other sites with fat layer exposed 07/26/2020 No Yes E66.01 Morbid (severe) obesity due to excess calories 07/26/2020 No Yes Inactive Problems Resolved Problems Electronic Signature(s) Signed: 10/25/2020 1:29:33 PM By: Lenda Kelp PA-C Entered By: Lenda Kelp on 10/25/2020 13:29:32 Gervais, [name] ([id number]) -------------------------------------------------------------------------------- Progress Note Details Patient Name: [name] Simmonds Date of Service: 10/25/2020 12:45 PM Medical Record Number: [id number] Patient Account Number: [id number] Date of Birth/Sex: 1985/05/04 (35 y.o. F) Treating RN: Rogers Blocker Primary Care Provider: Derrel Nip Other Clinician: Referring Provider: Derrel Nip Treating Provider/Extender: Rowan Blase in Treatment: 13 Subjective Chief Complaint Information obtained from Patient Left LE Ulcer History of Present Illness (HPI) 35 year old patient who started with having ulcerations on the right lower leg on the lateral part of her ankle for about 2 weeks. She was seen in the ER at [facility] and advised to see the wound care for a consultation. No X-rays of workup was done during the ER visit and no prescription for any medications of compression wraps were given. the patient is not diabetic but does have hypertension and her medications have been reviewed by me. 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 The  right and left greater saphenous vein was found to be tortuous. Deep venous system was also not competent and there was reflux of more than 500 ms. She was then seen by Dr. Tawanna Cooler Early who recommended that the patient would not benefit from endovenous ablation and he had recommended vein stripping on the right side and multiple small phlebectomy procedures on the left side. the patient did not follow-up due to social economic reasons. She has not been wearing any compression stockings and has not taken any specific treatment for varicose veins for the last 3 years. 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 We have obtained some appointments to see the vascular surgeons by the end of the month and the patient would like to follow up with me at my [facility] on Wednesday, June 29. 10/14/2014 -- she could not see me yesterday in [place] and hence has come for a review today. She has a vascular workup to be done this afternoon at [facility]. She is doing fine otherwise. 10/22/2014 -- she was seen by Dr. [name] and he has recommended surgical removal of her right saphenous vein from distal thigh to saphenofemoral junction and stab phlebectomy's of multiple large tributary branches throughout her thigh and calf. This would be done under general anesthesia in the outpatient setting. 10/29/2014 -- she is trying to work on a surgical date and in the meanwhile we have got insurance clearance for Apligraf and we will start this next week. 7/22 2016 -- she is here for the first application of Apligraf. 11/19/2014 -- she is here for a second application of Apligraf 11/26/2014 -- she has done fine after her last application of Apligraf and is awaiting her surgery which is scheduled for August 31. 12/03/2014 -- she is doing fine and is here for her third application of Apligraf. 12/21/2014 -- She  had surgery on 12/15/2014 by Dr.Early who did #1 ligation and stripping of right great saphenous vein from distal thigh to saphenofemoral junction, #2 stab phlebectomy of large tributary varicose veins in the thigh popliteal space and calf. She had an Ace wrap up to her groin and this was removed today and the Unna's boot was also removed. 12/28/2014 -- she is here for her fourth application of Apligraf. 01/06/2015 - he saw her vascular surgeon Dr. Arbie Cookey who was pleased with her progress and he has confirmed that no surgical procedures could be attempted on the left side. 01/13/2015 -- her wound looks very good and she's been having no problems whatsoever. Readmission: 07/26/2020 upon evaluation today patient presents for initial inspection here in our clinic for a new issue with her left leg although she is previously been seen due to issues with the right leg back in 2016. At that time she was seeing Dr. Arbie Cookey who is a vein/vascular specialist in [place]. He has since semiretired from what I understand. He is working out of [employer] I believe. Nonetheless she tells me at the time that there was really nothing to do for her left leg although the right leg was where they did most of the work. Subsequently she states that she is done fairly well until just in the past week where she had issues with bleeding from what appears to be  varicose vein on the left leg medially. Unfortunately this has continued to be an issue although she tells me at first it was coming much more significantly Down quite a bit but nonetheless has not completely resolved. Every time she showers she notices that it starts to drain a little bit more. She does have a history of chronic venous insufficiency, lymphedema, varicose veins bilaterally, and obesity. 08/02/2020 upon evaluation today patient appears to be doing about the same in regards to the ulcer on her left leg. She has some eschar covering there is definitely some  fluid collecting underneath unfortunately. With that being said she tells me she is still having a tremendous amount of pain therefore she is really not able to allow me to clean this off very effectively to be perfectly honest. I think we need to try to soften this up 08/16/2020 upon evaluation today patient's wound is really not doing significantly better not really states about the same. She notes that the wrap just does not seem to be staying up very well at all unfortunately. No fevers, chills, nausea, vomiting, or diarrhea. She did cut it off once it starts to slide in order to alleviate some of the pressure from sliding Down. Fortunately there is no signs of active infection at this time which is great news. [name], [name] ([id number]) 08/23/2020 upon evaluation today patient appears to be doing well 08/23/2020 upon evaluation today patient appears to be doing well with regard to her wound all things considered. Fortunately there does not appear to be any signs of active infection at this time which is great news. She has been tolerating the dressing changes without complication and overall I am extremely pleased with where things stand at this point. [facility] 08/30/2020 upon evaluation today patient actually appears to be doing decently well in regard to her wound. Fortunately there is no signs of active infection which is great news. Nonetheless I do believe that the patient is going require little bit of debridement if she is okay with me attempting that today I think that will help clean off some of the necrotic tissue. Fortunately there does not appear to be otherwise any evidence of active infection which is also great news. 09/19/2020 upon evaluation today patient appears to be doing a little better in regard to her wound as compared to previous. Fortunately there does not appear to be any signs of active infection overall. No  fever chills noted. I do believe that the Iodosorb has made this a little bit better with regard to the overall size and appearance of the wound bed though again she does still have quite a ways to go to get this to heal she still very tender to touch. 09/27/2020 upon evaluation today patient appears to actually be doing quite well with regard to her wound. This is measuring much smaller which is great news. With that being said she [facility] and they subsequently recommended that surgery is really what she probably needs to go forward with sounds like the potential for venous ablation. With that being said the patient tells me this is just not the right time for her to be able to proceed with any type of surgery which I completely understand. Nonetheless I do believe that she would continue to benefit from compression but again that is really not something that she is able to easily do. 10/04/2020 upon evaluation  today patient appears to be doing about the same in regard to her wound. This is measuring a little bit smaller but nonetheless still is open and again has some slough and biofilm noted on the surface of the wound. There does not appear to be any signs of active infection which is great news. No fevers, chills, nausea, vomiting, or diarrhea. 10/04/2020 upon evaluation today patient appears to be doing well with regard to her wound. She has been tolerating dressing changes without complication. Fortunately there does not appear to be any signs of active infection which is great news. No fevers, chills, nausea, vomiting, or diarrhea. 10/25/2020 upon evaluation today patient appears to be doing well with regard to her wound. She has been tolerating the dressing changes without complication. Fortunately there is no signs of active infection at this time. No fevers, chills, nausea, vomiting, or diarrhea. Objective Constitutional Well-nourished and well-hydrated in no  acute distress. Vitals Time Taken: 1:21 PM, Height: 66 in, Weight: 388 lbs, BMI: 62.6, Temperature: 98.3 F, Pulse: 97 bpm, Respiratory Rate: 18 breaths/min, Blood Pressure: 103/72 mmHg. Respiratory normal breathing without difficulty. Psychiatric this patient is able to make decisions and demonstrates good insight into disease process. Alert and Oriented x 3. pleasant and cooperative. General Notes: Upon inspection patient's wound bed actually showed signs of good granulation epithelization there is minimal slough noted I was able to clean this away with saline and gauze she tolerated that with some discomfort but nothing too bad. Overall very pleased with where things stand. Integumentary (Hair, Skin) Wound #3 status is Open. Original cause of wound was Gradually Appeared. The date acquired was: 07/18/2020. The wound has been in treatment 13 weeks. The wound is located on the Left,Distal,Medial Lower Leg. The wound measures 0.5cm length x 0.5cm width x 0.2cm depth; 0.196cm^2 area and 0.039cm^3 volume. There is Fat Layer (Subcutaneous Tissue) exposed. There is no tunneling or undermining noted. There is a medium amount of serosanguineous drainage noted. The wound margin is flat and intact. There is large (67-100%) red granulation within the wound bed. There is a small (1-33%) amount of necrotic tissue within the wound bed including Adherent Slough. 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 ([id number]) Morbid (severe) obesity due to excess calories Plan Follow-up Appointments: Return Appointment in 1 week. Bathing/ Shower/ Hygiene: Wash wounds with antibacterial soap and water. Edema Control - Lymphedema / Segmental Compressive Device / Other: Elevate,  Exercise Daily and Avoid Standing for Long Periods of Time. Elevate legs to the level of the heart and pump ankles as often as possible Elevate leg(s) parallel to the floor when sitting. WOUND #3: - Lower Leg Wound Laterality: Left, Medial, Distal Cleanser: Soap and Water 3 x Per Week/15 Days Discharge Instructions: Gently cleanse wound with antibacterial soap, rinse and pat dry prior to dressing wounds Primary Dressing: Iodosorb 40 (g) (Generic) 3 x Per Week/15 Days Discharge Instructions: Apply IodoSorb to wound bed only as directed. Secondary Dressing: ABD Pad 5x9 (in/in) (Generic) 3 x Per Week/15 Days Discharge Instructions: Cover with ABD pad Secured With: 25M ACE Elastic Bandage With VELCRO Brand Closure, 4 (in) (Generic) 3 x Per Week/15 Days Discharge Instructions: Wrap from ankle to calf-patient to adjust if ACE wrap falls 1. Would recommend that we going continue with the wound care measures as before and the patient  is in agreement with that plan that includes the use of the Iodoflex which I think has done well for the patient up to this point. She is actually using Iodosorb essentially the same thing. 2. Also recommend that we have the patient continue to use an ABD pad to cover followed by a Ace wrap. 3. She does not go swimming I think she can do this however if she does I would just recommend that she use a next care bandage to cover over this in the knee and elbow size in order to help prevent it from getting any water onto the wound bed which I think will be fine. We will see patient back for reevaluation in 1 week here in the clinic. If anything worsens or changes patient will contact our office for additional recommendations. Electronic Signature(s) Signed: 10/25/2020 1:48:57 PM By: Lenda Kelp PA-C Entered By: Lenda Kelp on 10/25/2020 13:48:57 [name], [name] ([id number]) -------------------------------------------------------------------------------- SuperBill  Details Patient Name: [name], [name] Date of Service: 10/25/2020 Medical Record Number: [id number] Patient Account Number: [id number] Date of Birth/Sex: Apr 08, 1986 (35 y.o. F) Treating RN: Rogers Blocker Primary Care Provider: Derrel Nip Other Clinician: Referring Provider: Derrel Nip Treating Provider/Extender: Rowan Blase in Treatment: 13 Diagnosis Coding ICD-10 Codes Code Description I87.2 Venous insufficiency (chronic) (peripheral) I89.0 Lymphedema, not elsewhere classified I83.893 Varicose veins of bilateral lower extremities with other complications L98.492 Non-pressure chronic ulcer of skin of other sites with fat layer exposed E66.01 Morbid (severe) obesity due to excess calories Facility Procedures CPT4 Code: 52481859 Description: 99213 - WOUND CARE VISIT-LEV 3 EST PT Modifier: Quantity: 1 Physician Procedures CPT4 Code: 0931121 Description: 99213 - WC PHYS LEVEL 3 - EST PT Modifier: Quantity: 1 CPT4 Code: Description: ICD-10 Diagnosis Description I87.2 Venous insufficiency (chronic) (peripheral) I89.0 Lymphedema, not elsewhere classified I83.893 Varicose veins of bilateral lower extremities with other complications L98.492 Non-pressure chronic ulcer of  skin of other sites with fat layer expos Modifier: ed Quantity: Electronic Signature(s) Signed: 10/25/2020 1:49:14 PM By: Lenda Kelp PA-C Entered By: Lenda Kelp on 10/25/2020 13:49:13

## 2020-10-25 NOTE — Progress Notes (Signed)
[name], [name] ([id number]) Visit Report for 10/25/2020 Arrival Information Details Patient Name: [name], [name]. Date of Service: 10/25/2020 12:45 PM Medical Record Number: [id number] Patient Account Number: [id number] Date of Birth/Sex: [birth year] (35 y.o. F) Treating RN: Dolan Amen Primary Care Jessica Checketts: Gifford Shave Other Clinician: Referring Antar Milks: Gifford Shave Treating Angelina Neece/Extender: Skipper Cliche in Treatment: 13 Visit Information History Since Last Visit Has Dressing in Place as Prescribed: Yes Patient Arrived: Ambulatory Pain Present Now: Yes Arrival Time: 13:19 Accompanied By: self Transfer Assistance: None Patient Identification Verified: Yes Secondary Verification Process Completed: Yes Patient Requires Transmission-Based Precautions: No Patient Has Alerts: No Electronic Signature(s) Signed: 10/25/2020 4:18:09 PM By: Dolan Amen RN Entered By: Dolan Amen on 10/25/2020 13:21:56 [name] ([id number]) -------------------------------------------------------------------------------- Clinic Level of Care Assessment Details Patient Name: [name], [name] Date of Service: 10/25/2020 12:45 PM Medical Record Number: [id number] Patient Account Number: [id number] Date of Birth/Sex: 1985-08-22 (35 y.o. F) Treating RN: Dolan Amen Primary Care Lutisha Knoche: Gifford Shave Other Clinician: Referring Keondra Haydu: Gifford Shave Treating Faigy Stretch/Extender: Skipper Cliche in Treatment: 13 Clinic Level of Care Assessment Items TOOL 4 Quantity Score X - Use when only an EandM is performed on FOLLOW-UP visit 1 0 ASSESSMENTS - Nursing Assessment / Reassessment X - Reassessment of Co-morbidities (includes updates in patient status) 1 10 X- 1 5 Reassessment of Adherence to Treatment Plan ASSESSMENTS - Wound and Skin Assessment / Reassessment X - Simple Wound Assessment / Reassessment - one wound 1 5 []  - 0 Complex Wound Assessment /  Reassessment - multiple wounds []  - 0 Dermatologic / Skin Assessment (not related to wound area) ASSESSMENTS - Focused Assessment []  - Circumferential Edema Measurements - multi extremities 0 []  - 0 Nutritional Assessment / Counseling / Intervention []  - 0 Lower Extremity Assessment (monofilament, tuning fork, pulses) []  - 0 Peripheral Arterial Disease Assessment (using hand held doppler) ASSESSMENTS - Ostomy and/or Continence Assessment and Care []  - Incontinence Assessment and Management 0 []  - 0 Ostomy Care Assessment and Management (repouching, etc.) PROCESS - Coordination of Care X - Simple Patient / Family Education for ongoing care 1 15 []  - 0 Complex (extensive) Patient / Family Education for ongoing care []  - 0 Staff obtains Programmer, systems, Records, Test Results / Process Orders []  - 0 Staff telephones HHA, Nursing Homes / Clarify orders / etc []  - 0 Routine Transfer to another Facility (non-emergent condition) []  - 0 Routine Hospital Admission (non-emergent condition) []  - 0 New Admissions / Biomedical engineer / Ordering NPWT, Apligraf, etc. []  - 0 Emergency Hospital Admission (emergent condition) X- 1 10 Simple Discharge Coordination []  - 0 Complex (extensive) Discharge Coordination PROCESS - Special Needs []  - Pediatric / Minor Patient Management 0 []  - 0 Isolation Patient Management []  - 0 Hearing / Language / Visual special needs []  - 0 Assessment of Community assistance (transportation, D/C planning, etc.) []  - 0 Additional assistance / Altered mentation []  - 0 Support Surface(s) Assessment (bed, cushion, seat, etc.) INTERVENTIONS - Wound Cleansing / Measurement Langsam, Aneita J. ([id number]) X- 1 5 Simple Wound Cleansing - one wound []  - 0 Complex Wound Cleansing - multiple wounds X- 1 5 Wound Imaging (photographs - any number of wounds) []  - 0 Wound Tracing (instead of photographs) X- 1 5 Simple Wound Measurement - one wound []  - 0 Complex  Wound Measurement - multiple wounds INTERVENTIONS - Wound Dressings []  - Small Wound Dressing one or multiple wounds 0 X- 1 15 Medium Wound Dressing one or  multiple wounds []  - 0 Large Wound Dressing one or multiple wounds []  - 0 Application of Medications - topical []  - 0 Application of Medications - injection INTERVENTIONS - Miscellaneous []  - External ear exam 0 []  - 0 Specimen Collection (cultures, biopsies, blood, body fluids, etc.) []  - 0 Specimen(s) / Culture(s) sent or taken to Lab for analysis []  - 0 Patient Transfer (multiple staff / Civil Service fast streamer / Similar devices) []  - 0 Simple Staple / Suture removal (25 or less) []  - 0 Complex Staple / Suture removal (26 or more) []  - 0 Hypo / Hyperglycemic Management (close monitor of Blood Glucose) []  - 0 Ankle / Brachial Index (ABI) - do not check if billed separately X- 1 5 Vital Signs Has the patient been seen at the hospital within the last three years: Yes Total Score: 80 Level Of Care: New/Established - Level 3 Electronic Signature(s) Signed: 10/25/2020 4:18:09 PM By: Dolan Amen RN Entered By: Dolan Amen on 10/25/2020 13:45:22 [name] ([id number]) -------------------------------------------------------------------------------- Lower Extremity Assessment Details Patient Name: [name], [name] Date of Service: 10/25/2020 12:45 PM Medical Record Number: [id number] Patient Account Number: [id number] Date of Birth/Sex: [birth year] (35 y.o. F) Treating RN: Dolan Amen Primary Care Arna Luis: Gifford Shave Other Clinician: Referring Denelle Capurro: Gifford Shave Treating Detra Bores/Extender: Skipper Cliche in Treatment: 13 Edema Assessment Assessed: [Left: Yes] [Right: No] Edema: [Left: Ye] [Right: s] Calf Left: Right: Point of Measurement: 31 cm From Medial Instep 63 cm Ankle Left: Right: Point of Measurement: 9 cm From Medial Instep 31.5 cm Vascular Assessment Pulses: Dorsalis Pedis Palpable:  [Left:Yes] Electronic Signature(s) Signed: 10/25/2020 4:18:09 PM By: Dolan Amen RN Entered By: Dolan Amen on 10/25/2020 13:32:36 Raineri, Herminio [name] ([id number]) -------------------------------------------------------------------------------- Multi Wound Chart Details Patient Name: [name], [name] Date of Service: 10/25/2020 12:45 PM Medical Record Number: [id number] Patient Account Number: [id number] Date of Birth/Sex: [birth year] (35 y.o. F) Treating RN: Dolan Amen Primary Care Brok Stocking: Gifford Shave Other Clinician: Referring Ayo Smoak: Gifford Shave Treating Reba Hulett/Extender: Skipper Cliche in Treatment: 13 Vital Signs Height(in): 66 Pulse(bpm): 97 Weight(lbs): 388 Blood Pressure(mmHg): 103/72 Body Mass Index(BMI): 63 Temperature(F): 98.3 Respiratory Rate(breaths/min): 18 Photos: [N/A:N/A] Wound Location: Left, Distal, Medial Lower Leg N/A N/A Wounding Event: Gradually Appeared N/A N/A Primary Etiology: Venous Leg Ulcer N/A N/A Secondary Etiology: Lymphedema N/A N/A Comorbid History: Hypertension, Peripheral Venous N/A N/A Disease Date Acquired: 07/18/2020 N/A N/A Weeks of Treatment: 13 N/A N/A Wound Status: Open N/A N/A Measurements L x W x D (cm) 0.5x0.5x0.2 N/A N/A Area (cm) : 0.196 N/A N/A Volume (cm) : 0.039 N/A N/A % Reduction in Area: -532.30% N/A N/A % Reduction in Volume: -1200.00% N/A N/A Classification: Full Thickness Without Exposed N/A N/A Support Structures Exudate Amount: Medium N/A N/A Exudate Type: Serosanguineous N/A N/A Exudate Color: red, brown N/A N/A Wound Margin: Flat and Intact N/A N/A Granulation Amount: Large (67-100%) N/A N/A Granulation Quality: Red N/A N/A Necrotic Amount: Small (1-33%) N/A N/A Exposed Structures: Fat Layer (Subcutaneous Tissue): N/A N/A Yes Fascia: No Tendon: No Muscle: No Joint: No Bone: No Epithelialization: Small (1-33%) N/A N/A Treatment Notes Electronic Signature(s) Signed: 10/25/2020  4:18:09 PM By: Dolan Amen RN Entered By: Dolan Amen on 10/25/2020 13:41:03 Neville, Herminio [name] ([id number]) -------------------------------------------------------------------------------- Multi-Disciplinary Care Plan Details Patient Name: [name], [name]. Date of Service: 10/25/2020 12:45 PM Medical Record Number: [id number] Patient Account Number: [id number] Date of Birth/Sex: 29-Aug-1985 (35 y.o. F) Treating RN: Dolan Amen Primary Care Chief Walkup: Gifford Shave  Other Clinician: Referring Zionah Criswell: Gifford Shave Treating Quianna Avery/Extender: Skipper Cliche in Treatment: 13 Active Inactive Wound/Skin Impairment Nursing Diagnoses: Impaired tissue integrity Goals: Patient/caregiver will verbalize understanding of skin care regimen Date Initiated: 07/26/2020 Date Inactivated: 09/19/2020 Target Resolution Date: 07/26/2020 Goal Status: Met Ulcer/skin breakdown will have a volume reduction of 30% by week 4 Date Initiated: 07/26/2020 Date Inactivated: 09/19/2020 Target Resolution Date: 08/25/2020 Goal Status: Met Ulcer/skin breakdown will have a volume reduction of 50% by week 8 Date Initiated: 07/26/2020 Target Resolution Date: 09/25/2020 Goal Status: Active Ulcer/skin breakdown will have a volume reduction of 80% by week 12 Date Initiated: 07/26/2020 Target Resolution Date: 10/25/2020 Goal Status: Active Ulcer/skin breakdown will heal within 14 weeks Date Initiated: 07/26/2020 Target Resolution Date: 11/25/2020 Goal Status: Active Interventions: Assess patient/caregiver ability to obtain necessary supplies Assess patient/caregiver ability to perform ulcer/skin care regimen upon admission and as needed Assess ulceration(s) every visit Provide education on ulcer and skin care Treatment Activities: Referred to DME Tywana Robotham for dressing supplies : 07/26/2020 Skin care regimen initiated : 07/26/2020 Notes: Electronic Signature(s) Signed: 10/25/2020 4:18:09 PM By: Dolan Amen  RN Entered By: Dolan Amen on 10/25/2020 13:40:56 Firman, Herminio [name] ([id number]) -------------------------------------------------------------------------------- Pain Assessment Details Patient Name: [name], [name] Date of Service: 10/25/2020 12:45 PM Medical Record Number: [id number] Patient Account Number: [id number] Date of Birth/Sex: 09-25-1985 (35 y.o. F) Treating RN: Dolan Amen Primary Care Panzy Bubeck: Gifford Shave Other Clinician: Referring Monaca Wadas: Gifford Shave Treating Daemion Mcniel/Extender: Skipper Cliche in Treatment: 13 Active Problems Location of Pain Severity and Description of Pain Patient Has Paino Yes Site Locations Pain Location: Pain in Ulcers Rate the pain. Current Pain Level: 4 Pain Management and Medication Current Pain Management: Electronic Signature(s) Signed: 10/25/2020 4:18:09 PM By: Dolan Amen RN Entered By: Dolan Amen on 10/25/2020 13:22:54 Niebla, Herminio [name] ([id number]) -------------------------------------------------------------------------------- Patient/Caregiver Education Details Patient Name: [name], [name] Date of Service: 10/25/2020 12:45 PM Medical Record Number: [id number] Patient Account Number: [id number] Date of Birth/Gender: February 09, 1986 (35 y.o. F) Treating RN: Dolan Amen Primary Care Physician: Gifford Shave Other Clinician: Referring Physician: Gifford Shave Treating Physician/Extender: Skipper Cliche in Treatment: 13 Education Assessment Education Provided To: Patient Education Topics Provided Wound/Skin Impairment: Methods: Explain/Verbal Responses: State content correctly Electronic Signature(s) Signed: 10/25/2020 4:18:09 PM By: Dolan Amen RN Entered By: Dolan Amen on 10/25/2020 13:45:36 Upton, Herminio [name] ([id number]) -------------------------------------------------------------------------------- Wound Assessment Details Patient Name: [name], [name]. Date of Service:  10/25/2020 12:45 PM Medical Record Number: [id number] Patient Account Number: [id number] Date of Birth/Sex: May 26, 1985 (35 y.o. F) Treating RN: Dolan Amen Primary Care Shealynn Saulnier: Gifford Shave Other Clinician: Referring Cailey Trigueros: Gifford Shave Treating Malachy Coleman/Extender: Skipper Cliche in Treatment: 13 Wound Status Wound Number: 3 Primary Etiology: Venous Leg Ulcer Wound Location: Left, Distal, Medial Lower Leg Secondary Etiology: Lymphedema Wounding Event: Gradually Appeared Wound Status: Open Date Acquired: 07/18/2020 Comorbid History: Hypertension, Peripheral Venous Disease Weeks Of Treatment: 13 Clustered Wound: No Photos Wound Measurements Length: (cm) 0.5 Width: (cm) 0.5 Depth: (cm) 0.2 Area: (cm) 0.196 Volume: (cm) 0.039 % Reduction in Area: -532.3% % Reduction in Volume: -1200% Epithelialization: Small (1-33%) Tunneling: No Undermining: No Wound Description Classification: Full Thickness Without Exposed Support Structu Wound Margin: Flat and Intact Exudate Amount: Medium Exudate Type: Serosanguineous Exudate Color: red, brown res Foul Odor After Cleansing: No Slough/Fibrino Yes Wound Bed Granulation Amount: Large (67-100%) Exposed Structure Granulation Quality: Red Fascia Exposed: No Necrotic Amount: Small (1-33%) Fat Layer (Subcutaneous Tissue) Exposed: Yes Necrotic Quality: Adherent  Slough Tendon Exposed: No Muscle Exposed: No Joint Exposed: No Bone Exposed: No Electronic Signature(s) Signed: 10/25/2020 4:18:09 PM By: Dolan Amen RN Entered By: Dolan Amen on 10/25/2020 13:30:41 Langenberg, Herminio [name] ([id number]) -------------------------------------------------------------------------------- Vitals Details Patient Name: [name], [name] Date of Service: 10/25/2020 12:45 PM Medical Record Number: [id number] Patient Account Number: [id number] Date of Birth/Sex: 21-Jun-1985 (35 y.o. F) Treating RN: Dolan Amen Primary Care Kerline Trahan:  Gifford Shave Other Clinician: Referring Blaire Palomino: Gifford Shave Treating Nakya Weyand/Extender: Skipper Cliche in Treatment: 13 Vital Signs Time Taken: 13:21 Temperature (F): 98.3 Height (in): 66 Pulse (bpm): 97 Weight (lbs): 388 Respiratory Rate (breaths/min): 18 Body Mass Index (BMI): 62.6 Blood Pressure (mmHg): 103/72 Reference Range: 80 - 120 mg / dl Electronic Signature(s) Signed: 10/25/2020 4:18:09 PM By: Dolan Amen RN Entered By: Dolan Amen on 10/25/2020 13:22:24

## 2020-10-27 ENCOUNTER — Ambulatory Visit: Payer: BC Managed Care – PPO | Admitting: Vascular Surgery

## 2020-10-31 IMAGING — CT CT ANGIO CHEST
2 of 6 series · 18 of 36 positions shown · IV contrast (omnipaque)
Comparison: None.

CLINICAL DATA: Cough, COVID pneumonia, chest and back pain

EXAM:
CT ANGIOGRAPHY CHEST WITH CONTRAST
TECHNIQUE: Multidetector CT imaging of the chest was performed using the
standard protocol during bolus administration of intravenous
contrast. Multiplanar CT image reconstructions and MIPs were
obtained to evaluate the vascular anatomy.
CONTRAST:  100mL OMNIPAQUE IOHEXOL 350 MG/ML SOLN

[Series 7: pe thins · axial · 0.83mm/px · z∈[-316,-62]mm · 17 of 191 slices shown]
[im 11/191  lung]
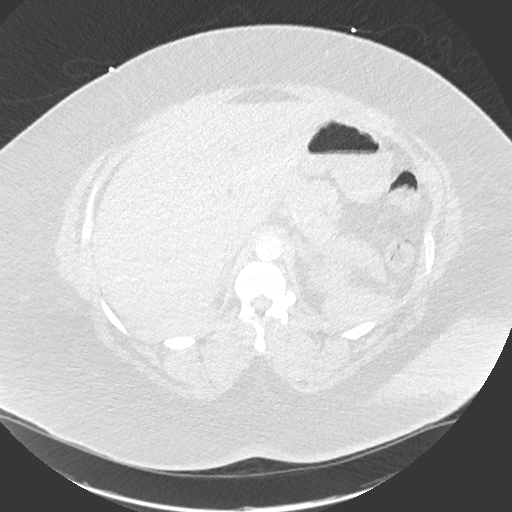
[im 22/191  mediastinal]
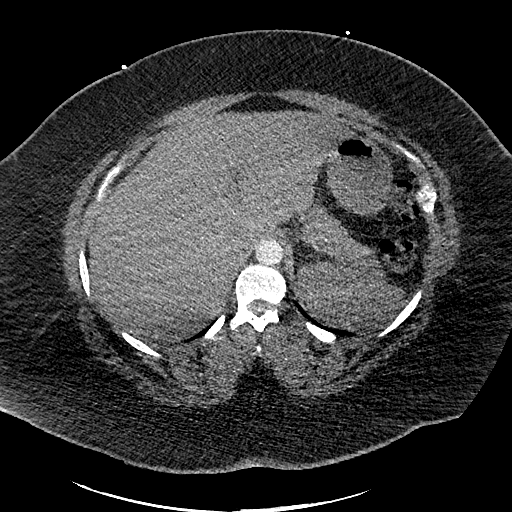
[im 32/191  lung]
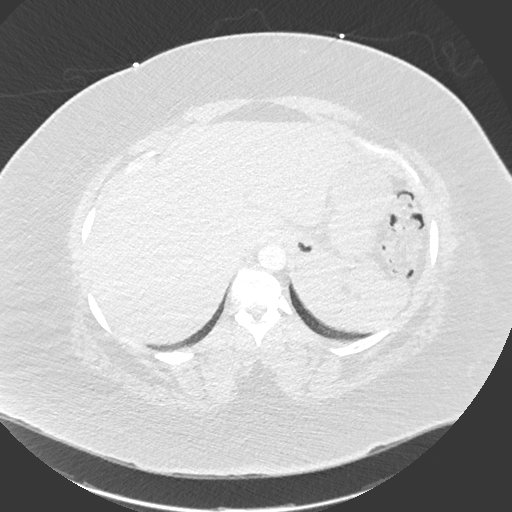
[im 43/191  mediastinal]
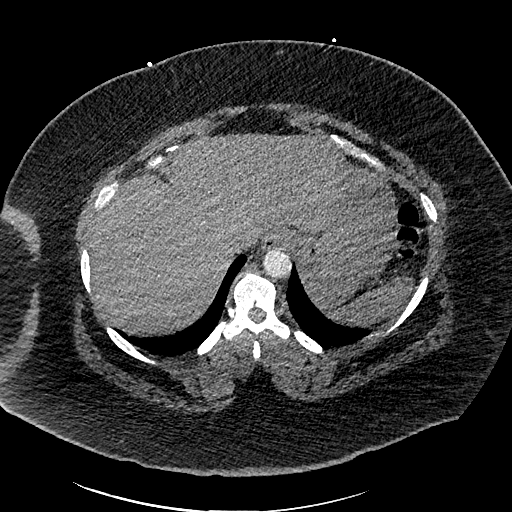
[im 53/191  lung]
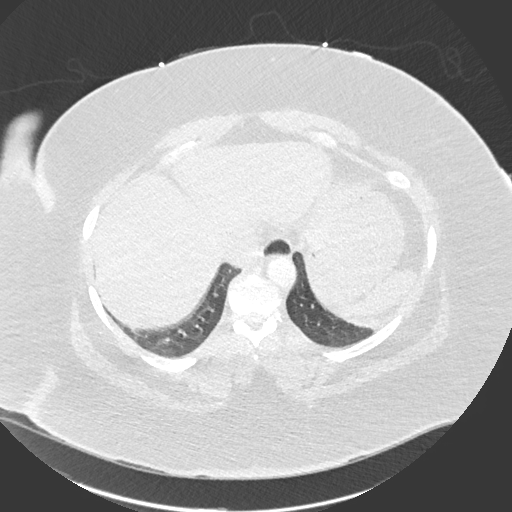
[im 64/191  mediastinal]
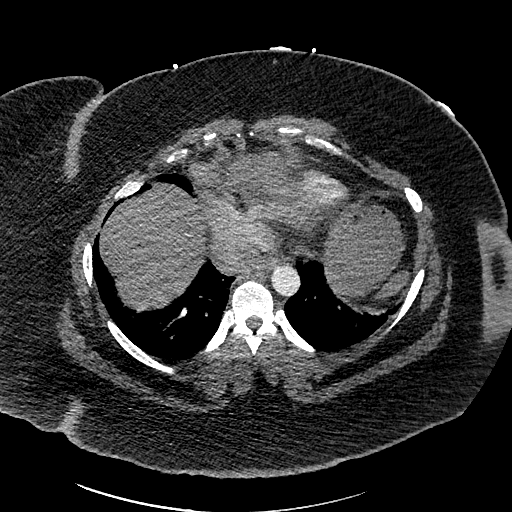
[im 74/191  lung]
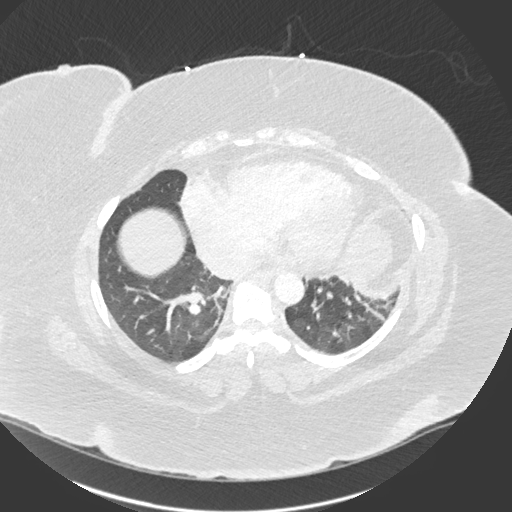
[im 85/191  mediastinal]
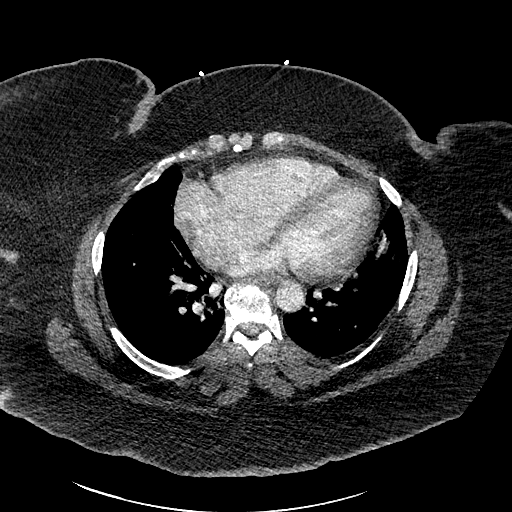
[im 96/191  lung]
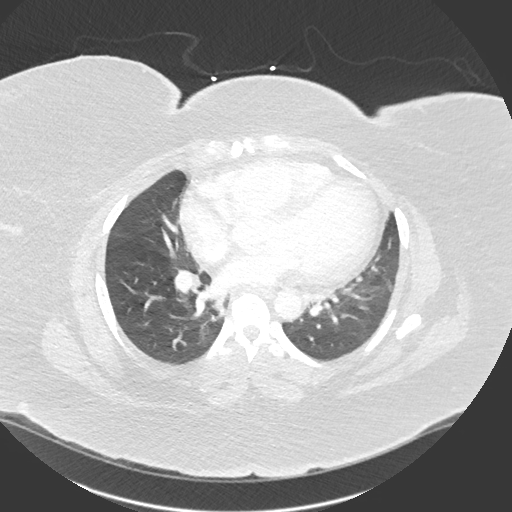
[im 106/191  mediastinal]
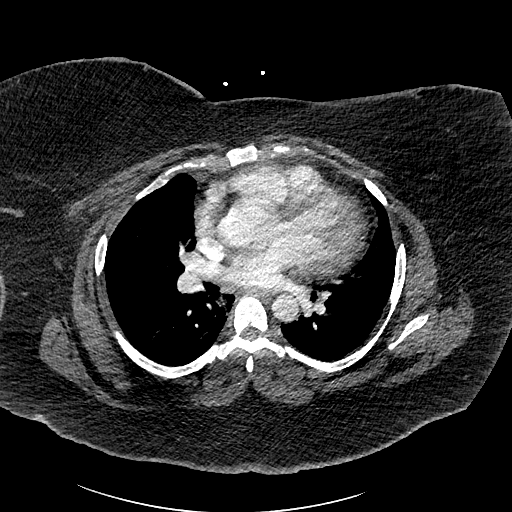
[im 117/191  lung]
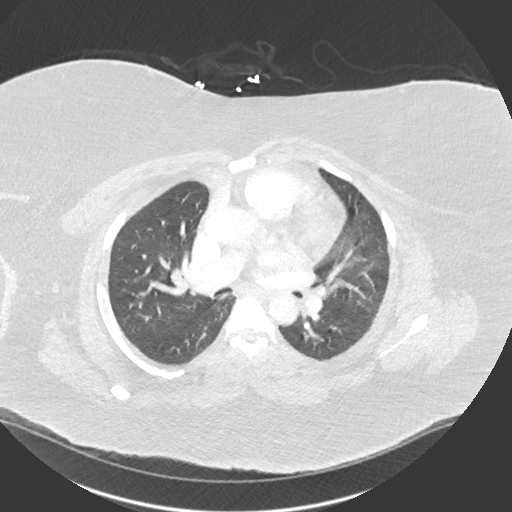
[im 127/191  mediastinal]
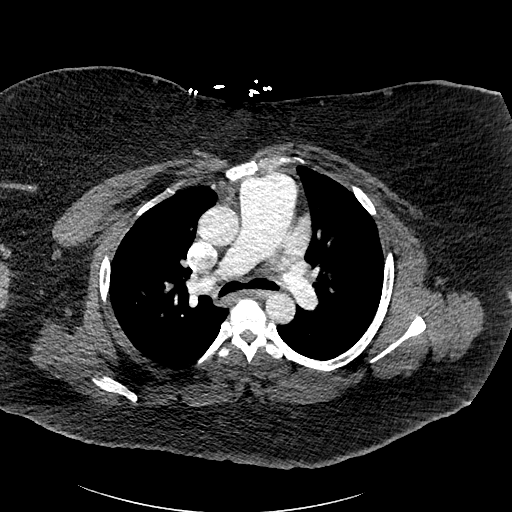
[im 138/191  lung]
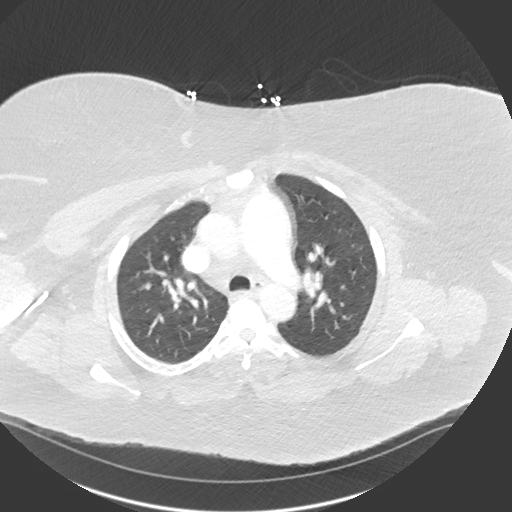
[im 148/191  mediastinal]
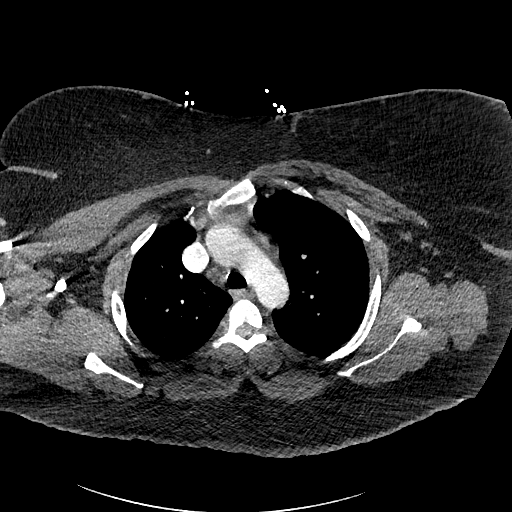
[im 159/191  lung]
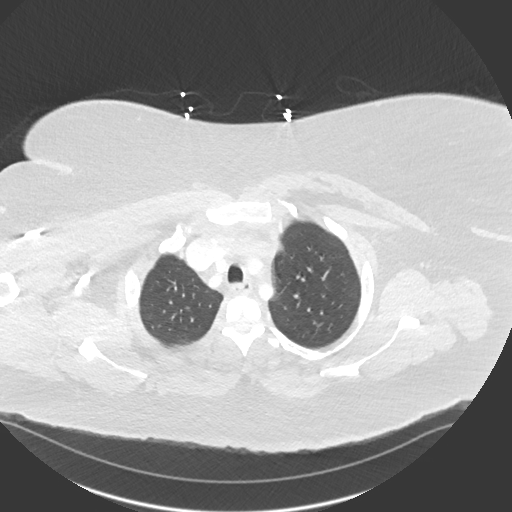
[im 169/191  mediastinal]
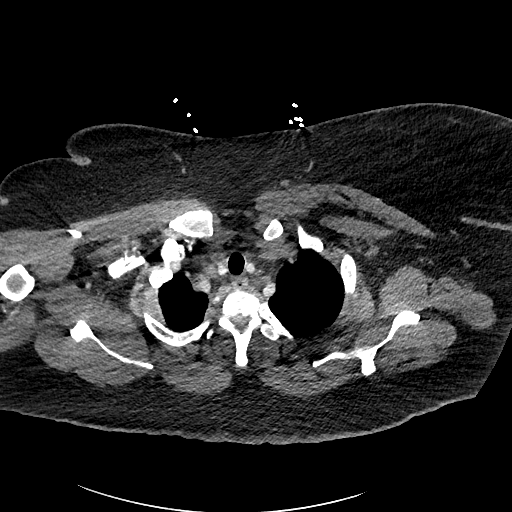
[im 180/191  lung]
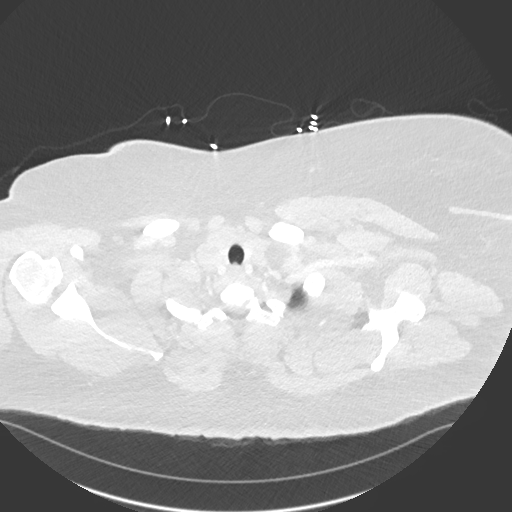

[Series 9: pe 2mm cor · coronal · 0.58mm/px · 1 of 91 slices shown]
[im 46/91  mediastinal]
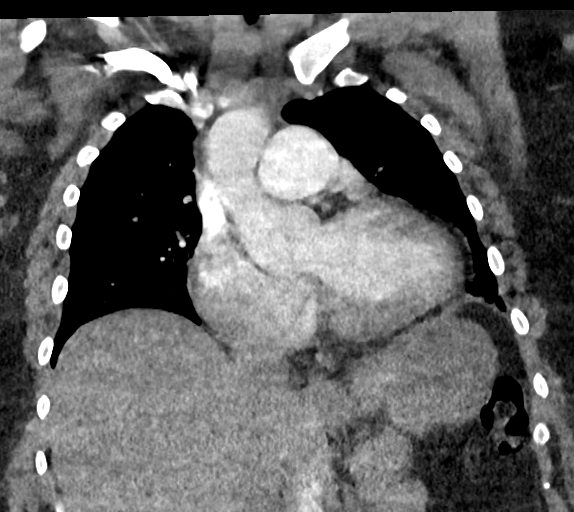

[18 of 36 positions shown; findings below may reference images not displayed]

FINDINGS: Cardiovascular: Satisfactory opacification of the pulmonary arteries
to the segmental level. No evidence of pulmonary embolism. The
central pulmonary arteries are enlarged in keeping with changes of
pulmonary arterial hypertension. No significant coronary artery
calcification. Cardiac size is mildly enlarged. No pericardial
effusion. The thoracic aorta is unremarkable.

Mediastinum/Nodes: No enlarged mediastinal, hilar, or axillary lymph
nodes. Thyroid gland, trachea, and esophagus demonstrate no
significant findings.

Lungs/Pleura: Mild bibasilar atelectasis. No superimposed confluent
pulmonary infiltrates or focal pulmonary nodules. No pneumothorax or
pleural effusion. The central airways are widely patent.

Upper Abdomen: No acute abnormality.

Musculoskeletal: No chest wall abnormality. No acute or significant
osseous findings.

Review of the MIP images confirms the above findings.
IMPRESSION: No evidence of pulmonary embolism through the segmental level.

Mild cardiomegaly. Morphologic changes in keeping with pulmonary
arterial hypertension.

## 2020-11-01 ENCOUNTER — Other Ambulatory Visit: Payer: Self-pay

## 2020-11-01 ENCOUNTER — Encounter: Payer: BC Managed Care – PPO | Admitting: Physician Assistant

## 2020-11-01 DIAGNOSIS — L98492 Non-pressure chronic ulcer of skin of other sites with fat layer exposed: Secondary | ICD-10-CM | POA: Diagnosis not present

## 2020-11-01 NOTE — Progress Notes (Signed)
[name], [name] ([id number]) Visit Report for 11/01/2020 Arrival Information Details Patient Name: [name], [name]. Date of Service: 11/01/2020 3:45 PM Medical Record Number: [id number] Patient Account Number: [id number] Date of Birth/Sex: 09-Oct-1985 (35 y.o. F) Treating RN: [name] Primary Care [name]: [name] Other Clinician: Referring [name]: [name] Treating [name]/Extender: [name] in Treatment: 14 Visit Information History Since Last Visit Has Dressing in Place as Prescribed: Yes Patient Arrived: Ambulatory Pain Present Now: No Arrival Time: 16:00 Accompanied By: self Transfer Assistance: None Patient Identification Verified: Yes Secondary Verification Process Completed: Yes Patient Requires Transmission-Based Precautions: No Patient Has Alerts: No Electronic Signature(s) Signed: 11/01/2020 4:36:40 PM By: [name] Amen RN Entered By: [name] on 11/01/2020 16:00:15 [name], [name] ([id number]) -------------------------------------------------------------------------------- Clinic Level of Care Assessment Details Patient Name: [name], [name] Date of Service: 11/01/2020 3:45 PM Medical Record Number: [id number] Patient Account Number: [id number] Date of Birth/Sex: 1985/12/31 (35 y.o. F) Treating RN: [name] Primary Care [name]: [name] Other Clinician: Referring [name]: [name] Treating [name]/Extender: [name] in Treatment: 14 Clinic Level of Care Assessment Items TOOL 4 Quantity Score X - Use when only an EandM is performed on FOLLOW-UP visit 1 0 ASSESSMENTS - Nursing Assessment / Reassessment X - Reassessment of Co-morbidities (includes updates in patient status) 1 10 X- 1 5 Reassessment of Adherence to Treatment Plan ASSESSMENTS - Wound and Skin Assessment / Reassessment X - Simple Wound Assessment / Reassessment - one wound 1 5 _0  - 0 Complex Wound Assessment /  Reassessment - multiple wounds _1  - 0 Dermatologic / Skin Assessment (not related to wound area) ASSESSMENTS - Focused Assessment _2  - Circumferential Edema Measurements - multi extremities 0 _3  - 0 Nutritional Assessment / Counseling / Intervention _4  - 0 Lower Extremity Assessment (monofilament, tuning fork, pulses) _5  - 0 Peripheral Arterial Disease Assessment (using hand held doppler) ASSESSMENTS - Ostomy and/or Continence Assessment and Care _6  - Incontinence Assessment and Management 0 _7  - 0 Ostomy Care Assessment and Management (repouching, etc.) PROCESS - Coordination of Care X - Simple Patient / Family Education for ongoing care 1 15 _8  - 0 Complex (extensive) Patient / Family Education for ongoing care _9  - 0 Staff obtains Programmer, systems, Records, Test Results / Process Orders _10  - 0 Staff telephones HHA, Nursing Homes / Clarify orders / etc _11  - 0 Routine Transfer to another Facility (non-emergent condition) _12  - 0 Routine Hospital Admission (non-emergent condition) _13  - 0 New Admissions / Biomedical engineer / Ordering NPWT, Apligraf, etc. _14  - 0 Emergency Hospital Admission (emergent condition) X- 1 10 Simple Discharge Coordination _15  - 0 Complex (extensive) Discharge Coordination PROCESS - Special Needs _16  - Pediatric / Minor Patient Management 0 _17  - 0 Isolation Patient Management _18  - 0 Hearing / Language / Visual special needs _19  - 0 Assessment of Community assistance (transportation, D/C planning, etc.) _20  - 0 Additional assistance / Altered mentation _21  - 0 Support Surface(s) Assessment (bed, cushion, seat, etc.) INTERVENTIONS - Wound Cleansing / Measurement [name], [name] ([id number]) X- 1 5 Simple Wound Cleansing - one wound _22  - 0 Complex Wound Cleansing - multiple wounds X- 1 5 Wound Imaging (photographs - any number of wounds) _23  - 0 Wound Tracing (instead of photographs) X- 1 5 Simple Wound Measurement - one wound _24  - 0 Complex  Wound Measurement - multiple wounds INTERVENTIONS - Wound Dressings _25  - Small Wound Dressing one or multiple wounds 0 X- 1 15 Medium Wound Dressing one or  multiple wounds _0  - 0 Large Wound Dressing one or multiple wounds <HDQQIWLNLGXQJJHE>_1<\/DEYCXKGYJEHUDJSH>_7  - 0 Application of Medications - topical <WYOVZCHYIFOYDXAJ>_2<\/INOMVEHMCNOBSJGG>_8  - 0 Application of Medications - injection INTERVENTIONS - Miscellaneous _3  - External ear exam 0 _4  - 0 Specimen Collection (cultures, biopsies, blood, body fluids, etc.) _5  - 0 Specimen(s) / Culture(s) sent or taken to Lab for analysis _6  - 0 Patient Transfer (multiple staff / Harrel Lemon Lift / Similar devices) _7  - 0 Simple Staple / Suture removal (25 or less) _8  - 0 Complex Staple / Suture removal (26 or more) _9  - 0 Hypo / Hyperglycemic Management (close monitor of Blood Glucose) _10  - 0 Ankle / Brachial Index (ABI) - do not check if billed separately X- 1 5 Vital Signs Has the patient been seen at the hospital within the last three years: Yes Total Score: 80 Level Of Care: New/Established - Level 3 Electronic Signature(s) Signed: 11/01/2020 4:36:40 PM By: [name] Amen RN Entered By: [name] on 11/01/2020 16:22:35 Bergum, [name] ([id number]) -------------------------------------------------------------------------------- Encounter Discharge Information Details Patient Name: [name], [name]. Date of Service: 11/01/2020 3:45 PM Medical Record Number: [id number] Patient Account Number: [id number] Date of Birth/Sex: Jun 16, 1985 (35 y.o. F) Treating RN: [name] Primary Care Shakeda Pearse: [name] Other Clinician: Referring Filippo Puls: [name] Treating Vivek Grealish/Extender: [name] in Treatment: 14 Encounter Discharge Information Items Discharge Condition: Stable Ambulatory Status: Ambulatory Discharge Destination: Home Transportation: Private Auto Accompanied By: self Schedule Follow-up Appointment: Yes Clinical Summary of Care: Electronic Signature(s) Signed:  11/01/2020 4:36:40 PM By: [name] Amen RN Entered By: [name] on 11/01/2020 16:23:05 Humphrey, [name] ([id number]) -------------------------------------------------------------------------------- Lower Extremity Assessment Details Patient Name: [name], [name] Date of Service: 11/01/2020 3:45 PM Medical Record Number: [id number] Patient Account Number: [id number] Date of Birth/Sex: 1985/05/10 (35 y.o. F) Treating RN: [name] Primary Care Floreine Kingdon: [name] Other Clinician: Referring Priscille Shadduck: [name] Treating Kalecia Hartney/Extender: Jeri Cos Weeks in Treatment: 14 Edema Assessment Assessed: Shirlyn Goltz: Yes] Patrice Paradise: No] Edema: [Left: Ye] [Right: s] Calf Left: Right: Point of Measurement: 31 cm From Medial Instep 60.5 cm Ankle Left: Right: Point of Measurement: 9 cm From Medial Instep 30.5 cm Vascular Assessment Pulses: Dorsalis Pedis Palpable: [Left:Yes] Electronic Signature(s) Signed: 11/01/2020 4:36:40 PM By: [name] Amen RN Entered By: [name] on 11/01/2020 16:09:51 Cubbage, [name] ([id number]) -------------------------------------------------------------------------------- Multi Wound Chart Details Patient Name: [name], [name] Date of Service: 11/01/2020 3:45 PM Medical Record Number: [id number] Patient Account Number: [id number] Date of Birth/Sex: 31-Oct-1985 (35 y.o. F) Treating RN: [name] Primary Care Johnmark Geiger: [name] Other Clinician: Referring Rayla Pember: [name] Treating Annaleigha Woo/Extender: [name] in Treatment: 14 Vital Signs Height(in): 66 Pulse(bpm): 58 Weight(lbs): 388 Blood Pressure(mmHg): 126/79 Body Mass Index(BMI): 63 Temperature(F): 98.6 Respiratory Rate(breaths/min): 18 Photos: [N/A:N/A] Wound Location: Left, Distal, Medial Lower Leg N/A N/A Wounding Event: Gradually Appeared N/A N/A Primary Etiology: Venous Leg Ulcer N/A N/A Secondary Etiology: Lymphedema N/A N/A Comorbid  History: Hypertension, Peripheral Venous N/A N/A Disease Date Acquired: 07/18/2020 N/A N/A Weeks of Treatment: 14 N/A N/A Wound Status: Open N/A N/A Measurements L x W x D (cm) 0.3x0.5x0.2 N/A N/A Area (cm) : 0.118 N/A N/A Volume (cm) : 0.024 N/A N/A % Reduction in Area: -280.60% N/A N/A % Reduction in Volume: -700.00% N/A N/A Classification: Full Thickness Without Exposed N/A N/A Support Structures Exudate Amount: Medium N/A N/A Exudate Type: Serosanguineous N/A N/A Exudate Color: red, brown N/A N/A Wound Margin: Flat and Intact N/A N/A Granulation Amount:  Large (67-100%) N/A N/A Granulation Quality: Red N/A N/A Necrotic Amount: Small (1-33%) N/A N/A Exposed Structures: Fat Layer (Subcutaneous Tissue): N/A N/A Yes Fascia: No Tendon: No Muscle: No Joint: No Bone: No Epithelialization: Small (1-33%) N/A N/A Treatment Notes Electronic Signature(s) Signed: 11/01/2020 4:36:40 PM By: [name] Amen RN Entered By: [name] on 11/01/2020 16:12:38 Brucks, [name] ([id number]) -------------------------------------------------------------------------------- [facility] Details Patient Name: [name], [name]. Date of Service: 11/01/2020 3:45 PM Medical Record Number: [id number] Patient Account Number: [id number] Date of Birth/Sex: [birth year] (35 y.o. F) Treating RN: [name] Primary Care Lister Brizzi: [name] Other Clinician: Referring Malayia Spizzirri: [name] Treating Oseas [name]/Extender: [name] in Treatment: 14 Active Inactive Wound/Skin Impairment Nursing Diagnoses: Impaired tissue integrity Goals: Patient/caregiver will verbalize understanding of skin care regimen Date Initiated: 07/26/2020 Date Inactivated: 09/19/2020 Target Resolution Date: 07/26/2020 Goal Status: Met Ulcer/skin breakdown will have a volume reduction of 30% by week 4 Date Initiated: 07/26/2020 Date Inactivated: 09/19/2020 Target Resolution Date: 08/25/2020 Goal  Status: Met Ulcer/skin breakdown will have a volume reduction of 50% by week 8 Date Initiated: 07/26/2020 Target Resolution Date: 09/25/2020 Goal Status: Active Ulcer/skin breakdown will have a volume reduction of 80% by week 12 Date Initiated: 07/26/2020 Target Resolution Date: 10/25/2020 Goal Status: Active Ulcer/skin breakdown will heal within 14 weeks Date Initiated: 07/26/2020 Target Resolution Date: 11/25/2020 Goal Status: Active Interventions: Assess patient/caregiver ability to obtain necessary supplies Assess patient/caregiver ability to perform ulcer/skin care regimen upon admission and as needed Assess ulceration(s) every visit Provide education on ulcer and skin care Treatment Activities: Referred to DME Derin Matthes for dressing supplies : 07/26/2020 Skin care regimen initiated : 07/26/2020 Notes: Electronic Signature(s) Signed: 11/01/2020 4:36:40 PM By: [name] Amen RN Entered By: [name] on 11/01/2020 16:11:24 , [name] ([id number]) -------------------------------------------------------------------------------- Pain Assessment Details Patient Name: [name], [name] Date of Service: 11/01/2020 3:45 PM Medical Record Number: [id number] Patient Account Number: [id number] Date of Birth/Sex: 29-Jul-1985 (35 y.o. F) Treating RN: [name] Primary Care Euleta Belson: [name] Other Clinician: Referring Trayven Lumadue: [name] Treating Essie Gehret/Extender: [name] in Treatment: 14 Active Problems Location of Pain Severity and Description of Pain Patient Has Paino No Site Locations Rate the pain. Current Pain Level: 0 Pain Management and Medication Current Pain Management: Electronic Signature(s) Signed: 11/01/2020 4:36:40 PM By: [name] Amen RN Entered By: [name] on 11/01/2020 16:03:43 Crymes, [name] ([id number]) -------------------------------------------------------------------------------- Patient/Caregiver Education  Details Patient Name: [name], [name] Date of Service: 11/01/2020 3:45 PM Medical Record Number: [id number] Patient Account Number: [id number] Date of Birth/Gender: Jul 03, 1985 (35 y.o. F) Treating RN: [name] Primary Care Physician: [name] Other Clinician: Referring Physician: Gifford [name] Treating Physician/Extender: [name] in Treatment: 14 Education Assessment Education Provided To: Patient Education Topics Provided Wound/Skin Impairment: Methods: Explain/Verbal Responses: State content correctly Electronic Signature(s) Signed: 11/01/2020 4:36:40 PM By: [name] Amen RN Entered By: [name] on 11/01/2020 16:23:27 [name], [name] ([id number]) -------------------------------------------------------------------------------- Wound Assessment Details Patient Name: [name], [name]. Date of Service: 11/01/2020 3:45 PM Medical Record Number: [id number] Patient Account Number: [id number] Date of Birth/Sex: November 04, 1985 (35 y.o. F) Treating RN: [name] Primary Care Aidan Moten: [name] Other Clinician: Referring Madolyn Ackroyd: [name] Treating Justine Dines/Extender: Jeri Cos Weeks in Treatment: 14 Wound Status Wound Number: 3 Primary Etiology: Venous Leg Ulcer Wound Location: Left, Distal, Medial Lower Leg Secondary Etiology: Lymphedema Wounding Event: Gradually Appeared Wound Status: Open Date Acquired: 07/18/2020 Comorbid History: Hypertension, Peripheral Venous Disease Weeks Of  Treatment: 14 Clustered Wound: No Photos Wound Measurements Length: (cm) 0.3 Width: (cm) 0.5 Depth: (cm) 0.2 Area: (cm) 0.118 Volume: (cm) 0.024 % Reduction in Area: -280.6% % Reduction in Volume: -700% Epithelialization: Small (1-33%) Tunneling: No Undermining: No Wound Description Classification: Full Thickness Without Exposed Support Structures Wound Margin: Flat and Intact Exudate Amount: Medium Exudate Type: Serosanguineous Exudate  Color: red, brown Foul Odor After Cleansing: No Slough/Fibrino Yes Wound Bed Granulation Amount: Large (67-100%) Exposed Structure Granulation Quality: Red Fascia Exposed: No Necrotic Amount: Small (1-33%) Fat Layer (Subcutaneous Tissue) Exposed: Yes Necrotic Quality: Adherent Slough Tendon Exposed: No Muscle Exposed: No Joint Exposed: No Bone Exposed: No Treatment Notes Wound #3 (Lower Leg) Wound Laterality: Left, Medial, Distal Cleanser Soap and Water Discharge Instruction: Gently cleanse wound with antibacterial soap, rinse and pat dry prior to dressing wounds Peri-Wound Care [name], [name] ([id number]) Topical Primary Dressing Iodosorb 40 (g) Discharge Instruction: Apply IodoSorb to wound bed only as directed. Secondary Dressing ABD Pad 5x9 (in/in) Discharge Instruction: Cover with ABD pad Secured With 36M ACE Elastic Bandage With VELCRO Brand Closure, 4 (in) Discharge Instruction: Wrap from ankle to calf-patient to adjust if ACE wrap falls Compression Wrap Compression Stockings Add-Ons Electronic Signature(s) Signed: 11/01/2020 4:36:40 PM By: [name] Amen RN Entered By: [name] on 11/01/2020 16:08:43 Ransome, [name] ([id number]) -------------------------------------------------------------------------------- [place] Details Patient Name: NATALIYAH, PACKHAM Date of Service: 11/01/2020 3:45 PM Medical Record Number: [id number] Patient Account Number: [id number] Date of Birth/Sex: 1986-03-29 (35 y.o. F) Treating RN: [name] Primary Care Ilamae Geng: [name] Other Clinician: Referring Jeremyah Jelley: [name] Treating Ladarious Kresse/Extender: [name] in Treatment: 14 Vital Signs Time Taken: 16:00 Temperature (F): 98.6 Height (in): 66 Pulse (bpm): 94 Weight (lbs): 388 Respiratory Rate (breaths/min): 18 Body Mass Index (BMI): 62.6 Blood Pressure (mmHg): 126/79 Reference Range: 80 - 120 mg / dl Electronic Signature(s) Signed:  11/01/2020 4:36:40 PM By: [name] Amen RN Entered By: [name] on 11/01/2020 16:03:24

## 2020-11-01 NOTE — Progress Notes (Addendum)
[name], [name] ([id number]) Visit Report for 11/01/2020 Chief Complaint Document Details Patient Name: [name], [name]. Date of Service: 11/01/2020 3:45 PM Medical Record Number: [id number] Patient Account Number: [id number] Date of Birth/Sex: 02/17/1986 (35 y.o. F) Treating RN: Rogers Blocker Primary Care Provider: Derrel Nip Other Clinician: Referring Provider: Derrel Nip Treating Provider/Extender: Rowan Blase in Treatment: 14 Information Obtained from: Patient Chief Complaint Left LE Ulcer Electronic Signature(s) Signed: 11/01/2020 4:12:13 PM By: Lenda Kelp PA-C Entered By: Lenda Kelp on 11/01/2020 16:12:12 Lang, [name] ([id number]) -------------------------------------------------------------------------------- HPI Details Patient Name: [name], [name] Date of Service: 11/01/2020 3:45 PM Medical Record Number: [id number] Patient Account Number: [id number] Date of Birth/Sex: 1985-06-11 (35 y.o. F) Treating RN: Rogers Blocker Primary Care Provider: Derrel Nip Other Clinician: Referring Provider: Derrel Nip Treating Provider/Extender: Rowan Blase in Treatment: 14 History of Present Illness HPI Description: 35 year old patient who started with having ulcerations on the right lower leg on the lateral part of her ankle for about 2 weeks. She was seen in the ER at [facility] and advised to see the wound care for a consultation. No X-rays of workup was done during the ER visit and no prescription for any medications of compression wraps were given. the patient is not diabetic but does have hypertension and her medications have been reviewed by me. 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 The right and left greater saphenous vein was found to be tortuous. Deep venous  system was also not competent and there was reflux of more than 500 ms. She was then seen by Dr. Tawanna Cooler Early who recommended that the patient would not benefit from endovenous ablation and he had recommended vein stripping on the right side and multiple small phlebectomy procedures on the left side. the patient did not follow-up due to social economic reasons. She has not been wearing any compression stockings and has not taken any specific treatment for varicose veins for the last 3 years. 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 We have obtained some appointments to see the vascular surgeons by the end of the month and the patient would like to follow up with me at my [facility] on Wednesday, June 29. 10/14/2014 -- she could not see me yesterday in [place] and hence has come for a review today. She has a vascular workup to be done this afternoon at [facility]. She is doing fine otherwise. 10/22/2014 -- she was seen by Dr. [name] and he has recommended surgical removal of her right saphenous vein from distal thigh to saphenofemoral junction and stab phlebectomy's of multiple large tributary branches throughout her thigh and calf. This would be done under general anesthesia in the outpatient setting. 10/29/2014 -- she is trying to work on a surgical date and in the meanwhile we have got insurance clearance for Apligraf and we will start this next week. 7/22 2016 -- she is here for the first application of Apligraf. 11/19/2014 -- she is here for a second application of Apligraf 11/26/2014 -- she has done fine after her last application of Apligraf and is awaiting her surgery which is scheduled for August 31. 12/03/2014 -- she is doing fine and is here for her third application  of Apligraf. 12/21/2014 -- She had surgery on 12/15/2014 by Dr.Early who did #1 ligation and stripping of  right great saphenous vein from distal thigh to saphenofemoral junction, #2 stab phlebectomy of large tributary varicose veins in the thigh popliteal space and calf. She had an Ace wrap up to her groin and this was removed today and the Unna's boot was also removed. 12/28/2014 -- she is here for her fourth application of Apligraf. 01/06/2015 - he saw her vascular surgeon Dr. Arbie Cookey who was pleased with her progress and he has confirmed that no surgical procedures could be attempted on the left side. 01/13/2015 -- her wound looks very good and she's been having no problems whatsoever. Readmission: 07/26/2020 upon evaluation today patient presents for initial inspection here in our clinic for a new issue with her left leg although she is previously been seen due to issues with the right leg back in 2016. At that time she was seeing Dr. Arbie Cookey who is a vein/vascular specialist in [place]. He has since semiretired from what I understand. He is working out of [employer] I believe. Nonetheless she tells me at the time that there was really nothing to do for her left leg although the right leg was where they did most of the work. Subsequently she states that she is done fairly well until just in the past week where she had issues with bleeding from what appears to be varicose vein on the left leg medially. Unfortunately this has continued to be an issue although she tells me at first it was coming much more significantly Down quite a bit but nonetheless has not completely resolved. Every time she showers she notices that it starts to drain a little bit more. She does have a history of chronic venous insufficiency, lymphedema, varicose veins bilaterally, and obesity. 08/02/2020 upon evaluation today patient appears to be doing about the same in regards to the ulcer on her left leg. She has some eschar covering there is definitely some fluid collecting underneath unfortunately. With that being said she tells  me she is still having a tremendous amount of pain therefore she is really not able to allow me to clean this off very effectively to be perfectly honest. I think we need to try to soften this up 08/16/2020 upon evaluation today patient's wound is really not doing significantly better not really states about the same. She notes that the wrap just does not seem to be staying up very well at all unfortunately. No fevers, chills, nausea, vomiting, or diarrhea. She did cut it off once it starts to slide in order to alleviate some of the pressure from sliding Down. Fortunately there is no signs of active infection at this time which is great news. 08/23/2020 upon evaluation today patient appears to be doing well 08/23/2020 upon evaluation today patient appears to be doing well with regard to her wound all things considered. Fortunately there does not appear to be any signs of active infection at this time which is great news. She has been tolerating the dressing changes without complication and overall I am extremely pleased with where things stand at this point. She does have her appointment with vascular in [facility] on June 9. SERENNA, DEROY ([id number]) 08/30/2020 upon evaluation today patient actually appears to be doing decently well in regard to her wound. Fortunately there is no signs of active infection which is great news. Nonetheless I do believe that the patient is going require little bit of debridement if  she is okay with me attempting that today I think that will help clean off some of the necrotic tissue. Fortunately there does not appear to be otherwise any evidence of active infection which is also great news. 09/19/2020 upon evaluation today patient appears to be doing a little better in regard to her wound as compared to previous. Fortunately there does not appear to be any signs of active infection overall. No fever chills noted. I do believe that the Iodosorb has made this a little bit  better with regard to the overall size and appearance of the wound bed though again she does still have quite a ways to go to get this to heal she still very tender to touch. 09/27/2020 upon evaluation today patient appears to actually be doing quite well with regard to her wound. This is measuring much smaller which is great news. With that being said she [facility] and they subsequently recommended that surgery is really what she probably needs to go forward with sounds like the potential for venous ablation. With that being said the patient tells me this is just not the right time for her to be able to proceed with any type of surgery which I completely understand. Nonetheless I do believe that she would continue to benefit from compression but again that is really not something that she is able to easily do. 10/04/2020 upon evaluation today patient appears to be doing about the same in regard to her wound. This is measuring a little bit smaller but nonetheless still is open and again has some slough and biofilm noted on the surface of the wound. There does not appear to be any signs of active infection which is great news. No fevers, chills, nausea, vomiting, or diarrhea. 10/04/2020 upon evaluation today patient appears to be doing well with regard to her wound. She has been tolerating dressing changes without complication. Fortunately there does not appear to be any signs of active infection which is great news. No fevers, chills, nausea, vomiting, or diarrhea. 10/25/2020 upon evaluation today patient appears to be doing well with regard to her wound. She has been tolerating the dressing changes without complication. Fortunately there is no signs of active infection at this time. No fevers, chills, nausea, vomiting, or diarrhea. 11/01/2020 upon evaluation today patient with regard to her wound. She has been tolerating the dressing changes without complication. Fortunately  there does not appear to be any signs of infection which is great news. No fever chills noted Electronic Signature(s) Signed: 11/01/2020 4:47:03 PM By: Lenda KelpStone III, Jodiann Ognibene PA-C Entered By: Lenda KelpStone III, Sophina Mitten on 11/01/2020 16:47:03 Lame, [name] ([id number]) -------------------------------------------------------------------------------- Physical Exam Details Patient Name: Conley SimmondsSUMMERS, Christie J. Date of Service: 11/01/2020 3:45 PM Medical Record Number: [id number] Patient Account Number: [id number] Date of Birth/Sex: 10/24/1985 (35 y.o. F) Treating RN: Rogers BlockerSanchez, Kenia Primary Care Provider: Derrel Nipresenzo, Victor Other Clinician: Referring Provider: Derrel Nipresenzo, Victor Treating Provider/Extender: Rowan BlaseStone, Corderius Saraceni Weeks in Treatment: 14 Constitutional Well-nourished and well-hydrated in no acute distress. Respiratory normal breathing without difficulty. Psychiatric this patient is able to make decisions and demonstrates good insight into disease process. Alert and Oriented x 3. pleasant and cooperative. Notes Upon inspection patient's wound bed showed signs of good granulation epithelization at this point. There does not appear to be any signs of infection which is great and overall very pleased with where things stand today. I do think the Iodoflex is doing a good job. Electronic Signature(s) Signed: 11/01/2020 4:47:18 PM By:  Linwood Dibbles, Travon Crochet PA-C Entered By: Lenda Kelp on 11/01/2020 16:47:18 Beber, [name] ([id number]) -------------------------------------------------------------------------------- Physician Orders Details Patient Name: ZAMYAH, WIESMAN. Date of Service: 11/01/2020 3:45 PM Medical Record Number: [id number] Patient Account Number: [id number] Date of Birth/Sex: May 04, 1985 (35 y.o. F) Treating RN: Rogers Blocker Primary Care Provider: Derrel Nip Other Clinician: Referring Provider: Derrel Nip Treating Provider/Extender: Rowan Blase in Treatment: 14 Verbal / Phone Orders:  No Diagnosis Coding ICD-10 Coding Code Description I87.2 Venous insufficiency (chronic) (peripheral) I89.0 Lymphedema, not elsewhere classified I83.893 Varicose veins of bilateral lower extremities with other complications L98.492 Non-pressure chronic ulcer of skin of other sites with fat layer exposed E66.01 Morbid (severe) obesity due to excess calories Follow-up Appointments o Return Appointment in 1 week. Bathing/ Shower/ Hygiene o Wash wounds with antibacterial soap and water. Edema Control - Lymphedema / Segmental Compressive Device / Other Left Lower Extremity o Elevate, Exercise Daily and Avoid Standing for Long Periods of Time. o Elevate legs to the level of the heart and pump ankles as often as possible o Elevate leg(s) parallel to the floor when sitting. Wound Treatment Wound #3 - Lower Leg Wound Laterality: Left, Medial, Distal Cleanser: Soap and Water 3 x Per Week/15 Days Discharge Instructions: Gently cleanse wound with antibacterial soap, rinse and pat dry prior to dressing wounds Primary Dressing: Iodosorb 40 (g) (Generic) 3 x Per Week/15 Days Discharge Instructions: Apply IodoSorb to wound bed only as directed. Secondary Dressing: ABD Pad 5x9 (in/in) (Generic) 3 x Per Week/15 Days Discharge Instructions: Cover with ABD pad Secured With: 49M ACE Elastic Bandage With VELCRO Brand Closure, 4 (in) (Generic) 3 x Per Week/15 Days Discharge Instructions: Wrap from ankle to calf-patient to adjust if ACE wrap falls Electronic Signature(s) Signed: 11/01/2020 4:36:40 PM By: Rogers Blocker RN Signed: 11/01/2020 6:10:04 PM By: Lenda Kelp PA-C Entered By: Rogers Blocker on 11/01/2020 16:15:02 Midgett, [name] -------------------------------------------------------------------------------- Problem List Details Patient Name: GREY, SCHLAUCH. Date of Service: 11/01/2020 3:45 PM Medical Record Number: [id number] Patient Account Number: [id number] Date of  Birth/Sex: [birth year] (35 y.o. F) Treating RN: Rogers Blocker Primary Care Provider: Derrel Nip Other Clinician: Referring Provider: Derrel Nip Treating Provider/Extender: Rowan Blase in Treatment: 14 Active Problems ICD-10 Encounter Code Description Active Date MDM Diagnosis I87.2 Venous insufficiency (chronic) (peripheral) 07/26/2020 No Yes I89.0 Lymphedema, not elsewhere classified 07/26/2020 No Yes I83.893 Varicose veins of bilateral lower extremities with other complications 07/26/2020 No Yes L98.492 Non-pressure chronic ulcer of skin of other sites with fat layer exposed 07/26/2020 No Yes E66.01 Morbid (severe) obesity due to excess calories 07/26/2020 No Yes Inactive Problems Resolved Problems Electronic Signature(s) Signed: 11/01/2020 4:12:05 PM By: Lenda Kelp PA-C Entered By: Lenda Kelp on 11/01/2020 16:12:05 Krisko, [name] ([id number]) -------------------------------------------------------------------------------- Progress Note Details Patient Name: [name] Date of Service: 11/01/2020 3:45 PM Medical Record Number: [id number] Patient Account Number: [id number] Date of Birth/Sex: Sep 05, 1985 (35 y.o. F) Treating RN: Rogers Blocker Primary Care Provider: Derrel Nip Other Clinician: Referring Provider: Derrel Nip Treating Provider/Extender: Rowan Blase in Treatment: 14 Subjective Chief Complaint Information obtained from Patient Left LE Ulcer History of Present Illness (HPI) 35 year old patient who started with having ulcerations on the right lower leg on the lateral part of her ankle for about 2 weeks. She was seen in the ER at [facility] and advised to see the wound care for a consultation. No X-rays of workup was done during the ER visit and no prescription for  any medications of compression wraps were given. the patient is not diabetic but does have hypertension and her medications have been reviewed by me. 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 The right and left greater saphenous vein was found to be tortuous. Deep venous system was also not competent and there was reflux of more than 500 ms. She was then seen by Dr. Tawanna Cooler Early who recommended that the patient would not benefit from endovenous ablation and he had recommended vein stripping on the right side and multiple small phlebectomy procedures on the left side. the patient did not follow-up due to social economic reasons. She has not been wearing any compression stockings and has not taken any specific treatment for varicose veins for the last 3 years. 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 We have obtained some appointments to see the vascular surgeons by the end of the month and the patient would like to follow up with me at my [facility] on Wednesday, June 29. 10/14/2014 -- she could not see me yesterday in [place] and hence has come for a review today. She has a vascular workup to be done this afternoon at [facility]. She is doing fine otherwise. 10/22/2014 -- she was seen by Dr. [name] and he has recommended surgical removal of her right saphenous vein from distal thigh to saphenofemoral junction and stab phlebectomy's of multiple large tributary branches throughout her thigh and calf. This would be done under general anesthesia in the outpatient setting. 10/29/2014 -- she is trying to work on a surgical date and in the meanwhile we have got insurance clearance for Apligraf and we will start this next week. 7/22 2016 -- she is here for the first application of Apligraf. 11/19/2014 -- she is here for a second application of Apligraf 11/26/2014 -- she has done  fine after her last application of Apligraf and is awaiting her surgery which is scheduled for August 31. 12/03/2014 -- she is doing fine and is here for her third application of Apligraf. 12/21/2014 -- She had surgery on 12/15/2014 by Dr.Early who did #1 ligation and stripping of right great saphenous vein from distal thigh to saphenofemoral junction, #2 stab phlebectomy of large tributary varicose veins in the thigh popliteal space and calf. She had an Ace wrap up to her groin and this was removed today and the Unna's boot was also removed. 12/28/2014 -- she is here for her fourth application of Apligraf. 01/06/2015 - he saw her vascular surgeon Dr. Arbie Cookey who was pleased with her progress and he has confirmed that no surgical procedures could be attempted on the left side. 01/13/2015 -- her wound looks very good and she's been having no problems whatsoever. Readmission: 07/26/2020 upon evaluation today patient presents for initial inspection here in our clinic for a new issue with her left leg although she is previously been seen due to issues with the right leg back in 2016. At that time she was seeing Dr. Arbie Cookey who is a vein/vascular specialist in [place]. He has since semiretired from what I understand. He is working out of [employer] I believe. Nonetheless she tells me at the time that there was really nothing to do for her left  leg although the right leg was where they did most of the work. Subsequently she states that she is done fairly well until just in the past week where she had issues with bleeding from what appears to be varicose vein on the left leg medially. Unfortunately this has continued to be an issue although she tells me at first it was coming much more significantly Down quite a bit but nonetheless has not completely resolved. Every time she showers she notices that it starts to drain a little bit more. She does have a history of chronic venous insufficiency, lymphedema,  varicose veins bilaterally, and obesity. 08/02/2020 upon evaluation today patient appears to be doing about the same in regards to the ulcer on her left leg. She has some eschar covering there is definitely some fluid collecting underneath unfortunately. With that being said she tells me she is still having a tremendous amount of pain therefore she is really not able to allow me to clean this off very effectively to be perfectly honest. I think we need to try to soften this up 08/16/2020 upon evaluation today patient's wound is really not doing significantly better not really states about the same. She notes that the wrap just does not seem to be staying up very well at all unfortunately. No fevers, chills, nausea, vomiting, or diarrhea. She did cut it off once it starts to slide in order to alleviate some of the pressure from sliding Down. Fortunately there is no signs of active infection at this time which is great news. ROWEN, HUR ([id number]) 08/23/2020 upon evaluation today patient appears to be doing well 08/23/2020 upon evaluation today patient appears to be doing well with regard to her wound all things considered. Fortunately there does not appear to be any signs of active infection at this time which is great news. She has been tolerating the dressing changes without complication and overall I am extremely pleased with where things stand at this point. She does have her appointment with vascular in [facility] on June 9. 08/30/2020 upon evaluation today patient actually appears to be doing decently well in regard to her wound. Fortunately there is no signs of active infection which is great news. Nonetheless I do believe that the patient is going require little bit of debridement if she is okay with me attempting that today I think that will help clean off some of the necrotic tissue. Fortunately there does not appear to be otherwise any evidence of active infection which is also great  news. 09/19/2020 upon evaluation today patient appears to be doing a little better in regard to her wound as compared to previous. Fortunately there does not appear to be any signs of active infection overall. No fever chills noted. I do believe that the Iodosorb has made this a little bit better with regard to the overall size and appearance of the wound bed though again she does still have quite a ways to go to get this to heal she still very tender to touch. 09/27/2020 upon evaluation today patient appears to actually be doing quite well with regard to her wound. This is measuring much smaller which is great news. With that being said she did see vein and vascular in [facility] and they subsequently recommended that surgery is really what she probably needs to go forward with sounds like the potential for venous ablation. With that being said the patient tells me this is just not the right time for her to be able to  proceed with any type of surgery which I completely understand. Nonetheless I do believe that she would continue to benefit from compression but again that is really not something that she is able to easily do. 10/04/2020 upon evaluation today patient appears to be doing about the same in regard to her wound. This is measuring a little bit smaller but nonetheless still is open and again has some slough and biofilm noted on the surface of the wound. There does not appear to be any signs of active infection which is great news. No fevers, chills, nausea, vomiting, or diarrhea. 10/04/2020 upon evaluation today patient appears to be doing well with regard to her wound. She has been tolerating dressing changes without complication. Fortunately there does not appear to be any signs of active infection which is great news. No fevers, chills, nausea, vomiting, or diarrhea. 10/25/2020 upon evaluation today patient appears to be doing well with regard to her wound. She has been tolerating the dressing  changes without complication. Fortunately there is no signs of active infection at this time. No fevers, chills, nausea, vomiting, or diarrhea. 11/01/2020 upon evaluation today patient with regard to her wound. She has been tolerating the dressing changes without complication. Fortunately there does not appear to be any signs of infection which is great news. No fever chills noted Objective Constitutional Well-nourished and well-hydrated in no acute distress. Vitals Time Taken: 4:00 PM, Height: 66 in, Weight: 388 lbs, BMI: 62.6, Temperature: 98.6 F, Pulse: 94 bpm, Respiratory Rate: 18 breaths/min, Blood Pressure: 126/79 mmHg. Respiratory normal breathing without difficulty. Psychiatric this patient is able to make decisions and demonstrates good insight into disease process. Alert and Oriented x 3. pleasant and cooperative. General Notes: Upon inspection patient's wound bed showed signs of good granulation epithelization at this point. There does not appear to be any signs of infection which is great and overall very pleased with where things stand today. I do think the Iodoflex is doing a good job. Integumentary (Hair, Skin) Wound #3 status is Open. Original cause of wound was Gradually Appeared. The date acquired was: 07/18/2020. The wound has been in treatment 14 weeks. The wound is located on the Left,Distal,Medial Lower Leg. The wound measures 0.3cm length x 0.5cm width x 0.2cm depth; 0.118cm^2 area and 0.024cm^3 volume. There is Fat Layer (Subcutaneous Tissue) exposed. There is no tunneling or undermining noted. There is a medium amount of serosanguineous drainage noted. The wound margin is flat and intact. There is large (67-100%) red granulation within the wound bed. There is a small (1-33%) amount of necrotic tissue within the wound bed including Adherent Slough. 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 Bathing/ Shower/ Hygiene: Wash wounds with antibacterial soap and water. Edema Control - Lymphedema / Segmental Compressive Device / Other: Elevate, Exercise Daily and Avoid Standing for Long Periods of Time. Elevate legs to the level of the heart and pump ankles as often as possible Elevate leg(s) parallel to the floor when sitting. WOUND #3: - Lower Leg Wound Laterality: Left, Medial, Distal Cleanser: Soap and Water 3 x Per Week/15 Days Discharge Instructions: Gently cleanse wound with antibacterial soap, rinse and pat dry prior to dressing wounds Primary Dressing: Iodosorb 40 (g) (Generic) 3 x Per  Week/15 Days Discharge Instructions: Apply IodoSorb to wound bed only as directed. Secondary Dressing: ABD Pad 5x9 (in/in) (Generic) 3 x Per Week/15 Days Discharge Instructions: Cover with ABD pad Secured With: 42M ACE Elastic Bandage With VELCRO Brand Closure, 4 (in) (Generic) 3 x Per Week/15 Days Discharge Instructions: Wrap from ankle to calf-patient to adjust if ACE wrap falls 1. Would recommend that we going continue with the wound care measures as before and the patient is in agreement with plan. This includes the use of the Iodosorb which seems to be doing quite well. I do not see any evidence of infection currently. 2. I am on recommend as well that we continue with the ABD pad and roll gauze followed by Ace wrap to secure in place that seems to be doing the best for her currently we tried wrapping that just did not work very well. We will see patient back for reevaluation in 1 week here in the clinic. If anything worsens or changes patient will contact our office for  additional recommendations. Electronic Signature(s) Signed: 11/01/2020 5:23:38 PM By: Lenda Kelp PA-C Entered By: Lenda Kelp on 11/01/2020 17:23:37 Orsini, [name] ([id number]) -------------------------------------------------------------------------------- SuperBill Details Patient Name: FAYE, STROHMAN Date of Service: 11/01/2020 Medical Record Number: [id number] Patient Account Number: [id number] Date of Birth/Sex: 26-May-1985 (35 y.o. F) Treating RN: Rogers Blocker Primary Care Provider: Derrel Nip Other Clinician: Referring Provider: Derrel Nip Treating Provider/Extender: Rowan Blase in Treatment: 14 Diagnosis Coding ICD-10 Codes Code Description I87.2 Venous insufficiency (chronic) (peripheral) I89.0 Lymphedema, not elsewhere classified I83.893 Varicose veins of bilateral lower extremities with other complications L98.492 Non-pressure chronic ulcer of skin of other sites with fat layer exposed E66.01 Morbid (severe) obesity due to excess calories Facility Procedures CPT4 Code: 78295621 Description: 99213 - WOUND CARE VISIT-LEV 3 EST PT Modifier: Quantity: 1 Physician Procedures CPT4 Code: 3086578 Description: 99213 - WC PHYS LEVEL 3 - EST PT Modifier: Quantity: 1 CPT4 Code: Description: ICD-10 Diagnosis Description I87.2 Venous insufficiency (chronic) (peripheral) I89.0 Lymphedema, not elsewhere classified I83.893 Varicose veins of bilateral lower extremities with other complications L98.492 Non-pressure chronic ulcer of  skin of other sites with fat layer expos Modifier: ed Quantity: Electronic Signature(s) Signed: 11/01/2020 5:23:56 PM By: Lenda Kelp PA-C Previous Signature: 11/01/2020 4:36:40 PM Version By: Rogers Blocker RN Entered By: Lenda Kelp on 11/01/2020 17:23:56

## 2020-11-08 ENCOUNTER — Ambulatory Visit: Payer: BC Managed Care – PPO | Admitting: Physician Assistant

## 2020-11-15 ENCOUNTER — Other Ambulatory Visit: Payer: Self-pay

## 2020-11-15 ENCOUNTER — Encounter: Payer: BC Managed Care – PPO | Attending: Physician Assistant | Admitting: Physician Assistant

## 2020-11-15 ENCOUNTER — Ambulatory Visit: Payer: BC Managed Care – PPO | Admitting: Physician Assistant

## 2020-11-15 DIAGNOSIS — I890 Lymphedema, not elsewhere classified: Secondary | ICD-10-CM | POA: Diagnosis not present

## 2020-11-15 DIAGNOSIS — I83893 Varicose veins of bilateral lower extremities with other complications: Secondary | ICD-10-CM | POA: Insufficient documentation

## 2020-11-15 DIAGNOSIS — Z6841 Body Mass Index (BMI) 40.0 and over, adult: Secondary | ICD-10-CM | POA: Diagnosis not present

## 2020-11-15 DIAGNOSIS — I872 Venous insufficiency (chronic) (peripheral): Secondary | ICD-10-CM | POA: Diagnosis not present

## 2020-11-15 DIAGNOSIS — L97822 Non-pressure chronic ulcer of other part of left lower leg with fat layer exposed: Secondary | ICD-10-CM | POA: Insufficient documentation

## 2020-11-15 DIAGNOSIS — E11622 Type 2 diabetes mellitus with other skin ulcer: Secondary | ICD-10-CM | POA: Insufficient documentation

## 2020-11-15 DIAGNOSIS — L97829 Non-pressure chronic ulcer of other part of left lower leg with unspecified severity: Secondary | ICD-10-CM | POA: Diagnosis present

## 2020-11-15 NOTE — Progress Notes (Addendum)
[name], [name] ([id number]) Visit Report for 11/15/2020 Chief Complaint Document Details Patient Name: [name], [name] Date of Service: 11/15/2020 3:45 PM Medical Record Number: [id number] Patient Account Number: [id number] Date of Birth/Sex: 06/30/1985 (35 y.o. F) Treating RN: Rogers BlockerSanchez, Kenia Primary Care Provider: Derrel Nipresenzo, Victor Other Clinician: Referring Provider: Derrel Nipresenzo, Victor Treating Provider/Extender: Rowan BlaseStone, Josemanuel Eakins Weeks in Treatment: 16 Information Obtained from: Patient Chief Complaint Left LE Ulcer Electronic Signature(s) Signed: 11/15/2020 3:43:49 PM By: Lenda KelpStone III, Kenan Moodie PA-C Entered By: Lenda KelpStone III, Zen Cedillos on 11/15/2020 15:43:49 Hoffer, [name] ([id number]) -------------------------------------------------------------------------------- HPI Details Patient Name: [name], [name] Date of Service: 11/15/2020 3:45 PM Medical Record Number: [id number] Patient Account Number: [id number] Date of Birth/Sex: 07/29/1985 (35 y.o. F) Treating RN: Rogers BlockerSanchez, Kenia Primary Care Provider: Derrel Nipresenzo, Victor Other Clinician: Referring Provider: Derrel Nipresenzo, Victor Treating Provider/Extender: Rowan BlaseStone, Yumiko Alkins Weeks in Treatment: 16 History of Present Illness HPI Description: 35 year old patient who started with having ulcerations on the right lower leg on the lateral part of her ankle for about 2 weeks. [facility] and advised to see the wound care for a consultation. No X-rays of workup was done during the ER visit and no prescription for any medications of compression wraps were given. the patient is not diabetic but does have hypertension and her medications have been reviewed by me. 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 The right and left greater saphenous vein was found to be tortuous. Deep venous system  was also not competent and there was reflux of more than 500 ms. She was then seen by Dr. Tawanna Coolerodd Early who recommended that the patient would not benefit from endovenous ablation and he had recommended vein stripping on the right side and multiple small phlebectomy procedures on the left side. the patient did not follow-up due to social economic reasons. She has not been wearing any compression stockings and has not taken any specific treatment for varicose veins for the last 3 years. 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 We have obtained some appointments to see the vascular surgeons by the end of the month and the patient would like to follow [facility] with [facility] on Wednesday, June 29. 10/14/2014 -- she could not see me yesterday in [place] and hence has come for a review today. She has [facility]. She is doing fine otherwise. 10/22/2014 -- she was seen by Dr. Gretta Beganodd Early and he has recommended surgical removal of her right saphenous vein from distal thigh to saphenofemoral junction and stab phlebectomy's of multiple large tributary branches throughout her thigh and calf. This would be done under general anesthesia in the outpatient setting. 10/29/2014 -- she is trying to work on a surgical date and in the meanwhile we have got insurance clearance for Apligraf and we will start this next week. 7/22 2016 -- she is here for the first application of Apligraf. 11/19/2014 -- she is here for a second application of Apligraf 11/26/2014 -- she has done fine after her last application of Apligraf and is awaiting her surgery which is scheduled for August 31. 12/03/2014 -- she is doing fine and is here for her third application  of Apligraf. 12/21/2014 -- She had surgery on 12/15/2014 by Dr.Early who did #1 ligation and stripping of right  great saphenous vein from distal thigh to saphenofemoral junction, #2 stab phlebectomy of large tributary varicose veins in the thigh popliteal space and calf. She had an Ace wrap up to her groin and this was removed today and the Unna's boot was also removed. 12/28/2014 -- she is here for her fourth application of Apligraf. 01/06/2015 - he saw her vascular surgeon Dr. Donnetta Hutching who was pleased with her progress and he has confirmed that no surgical procedures could be attempted on the left side. 01/13/2015 -- her wound looks very good and she's been having no problems whatsoever. Readmission: 07/26/2020 upon evaluation today patient presents for initial inspection here in our clinic for a new issue with her left leg although she is previously been seen due to issues with the right leg back in 2016. At that time she was seeing Dr. Donnetta Hutching who is a vein/vascular specialist in [place]. He has since semiretired from what I understand. He is working out of [employer] I believe. Nonetheless she tells me at the time that there was really nothing to do for her left leg although the right leg was where they did most of the work. Subsequently she states that she is done fairly well until just in the past week where she had issues with bleeding from what appears to be varicose vein on the left leg medially. Unfortunately this has continued to be an issue although she tells me at first it was coming much more significantly Down quite a bit but nonetheless has not completely resolved. Every time she showers she notices that it starts to drain a little bit more. She does have a history of chronic venous insufficiency, lymphedema, varicose veins bilaterally, and obesity. 08/02/2020 upon evaluation today patient appears to be doing about the same in regards to the ulcer on her left leg. She has some eschar covering there is definitely some fluid collecting underneath unfortunately. With that being said she tells me she  is still having a tremendous amount of pain therefore she is really not able to allow me to clean this off very effectively to be perfectly honest. I think we need to try to soften this up 08/16/2020 upon evaluation today patient's wound is really not doing significantly better not really states about the same. She notes that the wrap just does not seem to be staying up very well at all unfortunately. No fevers, chills, nausea, vomiting, or diarrhea. She did cut it off once it starts to slide in order to alleviate some of the pressure from sliding Down. Fortunately there is no signs of active infection at this time which is great news. 08/23/2020 upon evaluation today patient appears to be doing well 08/23/2020 upon evaluation today patient appears to be doing well with regard to her wound all things considered. Fortunately there does not appear to be any signs of active infection at this time which is great news. She has been tolerating the dressing changes without complication and overall I am extremely pleased with where things stand at this point. She does have her appointment with vascular in [facility] on June 9. SHERIECE, JEFCOAT ([id number]) 08/30/2020 upon evaluation today patient actually appears to be doing decently well in regard to her wound. Fortunately there is no signs of active infection which is great news. Nonetheless I do believe that the patient is going require little bit of debridement if  she is okay with me attempting that today I think that will help clean off some of the necrotic tissue. Fortunately there does not appear to be otherwise any evidence of active infection which is also great news. 09/19/2020 upon evaluation today patient appears to be doing a little better in regard to her wound as compared to previous. Fortunately there does not appear to be any signs of active infection overall. No fever chills noted. I do believe that the Iodosorb has made this a little bit  better with regard to the overall size and appearance of the wound bed though again she does still have quite a ways to go to get this to heal she still very tender to touch. 09/27/2020 upon evaluation today patient appears to actually be doing quite well with regard to her wound. This is measuring much smaller which is great news. With that being said she did see vein and vascular in [facility] and they subsequently recommended that surgery is really what she probably needs to go forward with sounds like the potential for venous ablation. With that being said the patient tells me this is just not the right time for her to be able to proceed with any type of surgery which I completely understand. Nonetheless I do believe that she would continue to benefit from compression but again that is really not something that she is able to easily do. 10/04/2020 upon evaluation today patient appears to be doing about the same in regard to her wound. This is measuring a little bit smaller but nonetheless still is open and again has some slough and biofilm noted on the surface of the wound. There does not appear to be any signs of active infection which is great news. No fevers, chills, nausea, vomiting, or diarrhea. 10/04/2020 upon evaluation today patient appears to be doing well with regard to her wound. She has been tolerating dressing changes without complication. Fortunately there does not appear to be any signs of active infection which is great news. No fevers, chills, nausea, vomiting, or diarrhea. 10/25/2020 upon evaluation today patient appears to be doing well with regard to her wound. She has been tolerating the dressing changes without complication. Fortunately there is no signs of active infection at this time. No fevers, chills, nausea, vomiting, or diarrhea. 11/01/2020 upon evaluation today patient with regard to her wound. She has been tolerating the dressing changes without complication. Fortunately  there does not appear to be any signs of infection which is great news. No fever chills noted 11/15/2020 upon evaluation today patient appears to be doing well with regard to her wound. Fortunately there is no signs of active infection at this time. No fevers, chills, nausea, vomiting, or diarrhea. With that being said she continues to have a significant amount of pain at the site even though this is very close to complete closure. She also had several varicose veins around the area which were also problematic. Overall however I feel like the patient is making excellent progress. Electronic Signature(s) Signed: 11/15/2020 5:20:59 PM By: Lenda Kelp PA-C Entered By: Lenda Kelp on 11/15/2020 17:20:59 Krell, [name] ([id number]) -------------------------------------------------------------------------------- Physical Exam Details Patient Name: NATHIFA, RITTHALER. Date of Service: 11/15/2020 3:45 PM Medical Record Number: [id number] Patient Account Number: [id number] Date of Birth/Sex: 30-Jun-1985 (35 y.o. F) Treating RN: Rogers Blocker Primary Care Provider: Derrel Nip Other Clinician: Referring Provider: Derrel Nip Treating Provider/Extender: Rowan Blase in Treatment: 16 Constitutional Well-nourished and well-hydrated in no acute distress.  Respiratory normal breathing without difficulty. Psychiatric this patient is able to make decisions and demonstrates good insight into disease process. Alert and Oriented x 3. pleasant and cooperative. Notes Upon inspection patient's wound bed actually showed signs of good granulation epithelization at this point. There does not appear to be any evidence of infection which is great news and overall I am extremely pleased with where things stand today. No fevers, chills, nausea, vomiting, or diarrhea. Electronic Signature(s) Signed: 11/15/2020 5:21:15 PM By: Lenda Kelp PA-C Entered By: Lenda Kelp on 11/15/2020  17:21:15 Cornwall, [name] ([id number]) -------------------------------------------------------------------------------- Physician Orders Details Patient Name: DAVID, TOWSON Date of Service: 11/15/2020 3:45 PM Medical Record Number: [id number] Patient Account Number: [id number] Date of Birth/Sex: 02-20-1986 (35 y.o. F) Treating RN: Rogers Blocker Primary Care Provider: Derrel Nip Other Clinician: Referring Provider: Derrel Nip Treating Provider/Extender: Rowan Blase in Treatment: [phone] Verbal / Phone Orders: No Diagnosis Coding ICD-10 Coding Code Description I87.2 Venous insufficiency (chronic) (peripheral) I89.0 Lymphedema, not elsewhere classified I83.893 Varicose veins of bilateral lower extremities with other complications L98.492 Non-pressure chronic ulcer of skin of other sites with fat layer exposed E66.01 Morbid (severe) obesity due to excess calories Follow-up Appointments o Return Appointment in 2 weeks. Bathing/ Shower/ Hygiene o Wash wounds with antibacterial soap and water. Edema Control - Lymphedema / Segmental Compressive Device / Other Left Lower Extremity o Elevate, Exercise Daily and Avoid Standing for Long Periods of Time. o Elevate legs to the level of the heart and pump ankles as often as possible o Elevate leg(s) parallel to the floor when sitting. Wound Treatment Wound #3 - Lower Leg Wound Laterality: Left, Medial, Distal Cleanser: Soap and Water 3 x Per Week/15 Days Discharge Instructions: Gently cleanse wound with antibacterial soap, rinse and pat dry prior to dressing wounds Primary Dressing: Iodosorb 40 (g) (Generic) 3 x Per Week/15 Days Discharge Instructions: Apply IodoSorb to wound bed only as directed. Secondary Dressing: ABD Pad 5x9 (in/in) (Generic) 3 x Per Week/15 Days Discharge Instructions: Cover with ABD pad Secured With: 34M ACE Elastic Bandage With VELCRO Brand Closure, 4 (in) (Generic) 3 x Per Week/15  Days Discharge Instructions: Wrap from ankle to calf-patient to adjust if ACE wrap falls Electronic Signature(s) Signed: 11/15/2020 4:36:24 PM By: Rogers Blocker RN Signed: 11/15/2020 6:20:17 PM By: Lenda Kelp PA-C Entered By: Rogers Blocker on 11/15/2020 16:19:22 Rayford, [name] ([id number]) -------------------------------------------------------------------------------- Problem List Details Patient Name: CHERYLN, BALCOM. Date of Service: 11/15/2020 3:45 PM Medical Record Number: [id number] Patient Account Number: [id number] Date of Birth/Sex: [birth year] (35 y.o. F) Treating RN: Rogers Blocker Primary Care Provider: Derrel Nip Other Clinician: Referring Provider: Derrel Nip Treating Provider/Extender: Rowan Blase in Treatment: 16 Active Problems ICD-10 Encounter Code Description Active Date MDM Diagnosis I87.2 Venous insufficiency (chronic) (peripheral) 07/26/2020 No Yes I89.0 Lymphedema, not elsewhere classified 07/26/2020 No Yes I83.893 Varicose veins of bilateral lower extremities with other complications 07/26/2020 No Yes L98.492 Non-pressure chronic ulcer of skin of other sites with fat layer exposed 07/26/2020 No Yes E66.01 Morbid (severe) obesity due to excess calories 07/26/2020 No Yes Inactive Problems Resolved Problems Electronic Signature(s) Signed: 11/15/2020 3:43:44 PM By: Lenda Kelp PA-C Entered By: Lenda Kelp on 11/15/2020 15:43:43 Haworth, [name] ([id number]) -------------------------------------------------------------------------------- Progress Note Details Patient Name: [name] Simmonds Date of Service: 11/15/2020 3:45 PM Medical Record Number: [id number] Patient Account Number: [id number] Date of Birth/Sex: Mar 18, 1986 (35 y.o. F) Treating RN: Rogers Blocker Primary Care Provider: Derrel Nip Other  Clinician: Referring Provider: Derrel Nip Treating Provider/Extender: Rowan Blase in Treatment: 16 Subjective Chief  Complaint Information obtained from Patient Left LE Ulcer History of Present Illness (HPI) 35 year old patient who started with having ulcerations on the right lower leg on the lateral part of her ankle for about 2 weeks. She was seen in the ER at [facility] and advised to see the wound care for a consultation. No X-rays of workup was done during the ER visit and no prescription for any medications of compression wraps were given. the patient is not diabetic but does have hypertension and her medications have been reviewed by me. 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 The right and left greater saphenous vein was found to be tortuous. Deep venous system was also not competent and there was reflux of more than 500 ms. She was then seen by Dr. Tawanna Cooler Early who recommended that the patient would not benefit from endovenous ablation and he had recommended vein stripping on the right side and multiple small phlebectomy procedures on the left side. the patient did not follow-up due to social economic reasons. She has not been wearing any compression stockings and has not taken any specific treatment for varicose veins for the last 3 years. 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 We have obtained some appointments to see the vascular surgeons by the end of the month and the patient would like to follow up with me at my [facility] on Wednesday, June 29. 10/14/2014 -- she could not see me yesterday in [place] and hence has come for a review today. She has a vascular workup to be done this afternoon at [facility]. She is doing fine otherwise. 10/22/2014 -- she was seen by Dr. [name] and he has recommended surgical  removal of her right saphenous vein from distal thigh to saphenofemoral junction and stab phlebectomy's of multiple large tributary branches throughout her thigh and calf. This would be done under general anesthesia in the outpatient setting. 10/29/2014 -- she is trying to work on a surgical date and in the meanwhile we have got insurance clearance for Apligraf and we will start this next week. 7/22 2016 -- she is here for the first application of Apligraf. 11/19/2014 -- she is here for a second application of Apligraf 11/26/2014 -- she has done fine after her last application of Apligraf and is awaiting her surgery which is scheduled for August 31. 12/03/2014 -- she is doing fine and is here for her third application of Apligraf. 12/21/2014 -- She had surgery on 12/15/2014 by Dr.Early who did #1 ligation and stripping of right great saphenous vein from distal thigh to saphenofemoral junction, #2 stab phlebectomy of large tributary varicose veins in the thigh popliteal space and calf. She had an Ace wrap up to her groin and this was removed today and the Unna's boot was also removed. 12/28/2014 -- she is here for her fourth application of Apligraf. 01/06/2015 - he saw her vascular surgeon Dr. Arbie Cookey who was pleased with her progress and he has confirmed that no surgical procedures could be attempted on the left side. 01/13/2015 -- her wound looks very good and she's been having no problems whatsoever. Readmission: 07/26/2020 upon  evaluation today patient presents for initial inspection here in our clinic for a new issue with her left leg although she is previously been seen due to issues with the right leg back in 2016. At that time she was seeing Dr. Arbie Cookey who is a vein/vascular specialist in [place]. He has since semiretired from what I understand. He is working out of [employer] I believe. Nonetheless she tells me at the time that there was really nothing to do for her left leg although the  right leg was where they did most of the work. Subsequently she states that she is done fairly well until just in the past week where she had issues with bleeding from what appears to be varicose vein on the left leg medially. Unfortunately this has continued to be an issue although she tells me at first it was coming much more significantly Down quite a bit but nonetheless has not completely resolved. Every time she showers she notices that it starts to drain a little bit more. She does have a history of chronic venous insufficiency, lymphedema, varicose veins bilaterally, and obesity. 08/02/2020 upon evaluation today patient appears to be doing about the same in regards to the ulcer on her left leg. She has some eschar covering there is definitely some fluid collecting underneath unfortunately. With that being said she tells me she is still having a tremendous amount of pain therefore she is really not able to allow me to clean this off very effectively to be perfectly honest. I think we need to try to soften this up 08/16/2020 upon evaluation today patient's wound is really not doing significantly better not really states about the same. She notes that the wrap just does not seem to be staying up very well at all unfortunately. No fevers, chills, nausea, vomiting, or diarrhea. She did cut it off once it starts to slide in order to alleviate some of the pressure from sliding Down. Fortunately there is no signs of active infection at this time which is great news. GIANA, CASTNER ([id number]) 08/23/2020 upon evaluation today patient appears to be doing well 08/23/2020 upon evaluation today patient appears to be doing well with regard to her wound all things considered. Fortunately there does not appear to be any signs of active infection at this time which is great news. She has been tolerating the dressing changes without complication and overall I am extremely pleased with where things stand at this  point. She does have her appointment with vascular in [facility] on June 9. 08/30/2020 upon evaluation today patient actually appears to be doing decently well in regard to her wound. Fortunately there is no signs of active infection which is great news. Nonetheless I do believe that the patient is going require little bit of debridement if she is okay with me attempting that today I think that will help clean off some of the necrotic tissue. Fortunately there does not appear to be otherwise any evidence of active infection which is also great news. 09/19/2020 upon evaluation today patient appears to be doing a little better in regard to her wound as compared to previous. Fortunately there does not appear to be any signs of active infection overall. No fever chills noted. I do believe that the Iodosorb has made this a little bit better with regard to the overall size and appearance of the wound bed though again she does still have quite a ways to go to get this to heal she still very tender to  touch. 09/27/2020 upon evaluation today patient appears to actually be doing quite well with regard to her wound. This is measuring much smaller which is great news. With that being said she did see vein and vascular in [facility] and they subsequently recommended that surgery is really what she probably needs to go forward with sounds like the potential for venous ablation. With that being said the patient tells me this is just not the right time for her to be able to proceed with any type of surgery which I completely understand. Nonetheless I do believe that she would continue to benefit from compression but again that is really not something that she is able to easily do. 10/04/2020 upon evaluation today patient appears to be doing about the same in regard to her wound. This is measuring a little bit smaller but nonetheless still is open and again has some slough and biofilm noted on the surface of the wound.  There does not appear to be any signs of active infection which is great news. No fevers, chills, nausea, vomiting, or diarrhea. 10/04/2020 upon evaluation today patient appears to be doing well with regard to her wound. She has been tolerating dressing changes without complication. Fortunately there does not appear to be any signs of active infection which is great news. No fevers, chills, nausea, vomiting, or diarrhea. 10/25/2020 upon evaluation today patient appears to be doing well with regard to her wound. She has been tolerating the dressing changes without complication. Fortunately there is no signs of active infection at this time. No fevers, chills, nausea, vomiting, or diarrhea. 11/01/2020 upon evaluation today patient with regard to her wound. She has been tolerating the dressing changes without complication. Fortunately there does not appear to be any signs of infection which is great news. No fever chills noted 11/15/2020 upon evaluation today patient appears to be doing well with regard to her wound. Fortunately there is no signs of active infection at this time. No fevers, chills, nausea, vomiting, or diarrhea. With that being said she continues to have a significant amount of pain at the site even though this is very close to complete closure. She also had several varicose veins around the area which were also problematic. Overall however I feel like the patient is making excellent progress. Objective Constitutional Well-nourished and well-hydrated in no acute distress. Vitals Time Taken: 3:51 PM, Height: 66 in, Weight: 388 lbs, BMI: 62.6, Temperature: 98.8 F, Pulse: 99 bpm, Respiratory Rate: 18 breaths/min, Blood Pressure: 115/77 mmHg. Respiratory normal breathing without difficulty. Psychiatric this patient is able to make decisions and demonstrates good insight into disease process. Alert and Oriented x 3. pleasant and cooperative. General Notes: Upon inspection patient's wound  bed actually showed signs of good granulation epithelization at this point. There does not appear to be any evidence of infection which is great news and overall I am extremely pleased with where things stand today. No fevers, chills, nausea, vomiting, or diarrhea. Integumentary (Hair, Skin) Wound #3 status is Open. Original cause of wound was Gradually Appeared. The date acquired was: 07/18/2020. The wound has been in treatment 16 weeks. The wound is located on the Left,Distal,Medial Lower Leg. The wound measures 0.1cm length x 0.1cm width x 0.1cm depth; 0.008cm^2 area and 0.001cm^3 volume. There is Fat Layer (Subcutaneous Tissue) exposed. There is no tunneling or undermining noted. There is a small amount of serosanguineous drainage noted. The wound margin is flat and intact. There is large (67-100%) pink granulation within the wound bed.  There is a small (1-33%) amount of necrotic tissue within the wound bed including Adherent Slough. 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 Bathing/ Shower/ Hygiene: Wash wounds with antibacterial soap and water. Edema Control - Lymphedema / Segmental Compressive Device / Other: Elevate, Exercise Daily and Avoid Standing for Long Periods of Time. Elevate legs to the level of the heart and pump ankles as often as possible Elevate leg(s) parallel to the floor when sitting. WOUND #3: - Lower Leg Wound Laterality: Left, Medial, Distal Cleanser: Soap and Water 3 x Per Week/15 Days Discharge Instructions: Gently cleanse wound with antibacterial soap, rinse and pat dry prior to dressing wounds Primary  Dressing: Iodosorb 40 (g) (Generic) 3 x Per Week/15 Days Discharge Instructions: Apply IodoSorb to wound bed only as directed. Secondary Dressing: ABD Pad 5x9 (in/in) (Generic) 3 x Per Week/15 Days Discharge Instructions: Cover with ABD pad Secured With: 52M ACE Elastic Bandage With VELCRO Brand Closure, 4 (in) (Generic) 3 x Per Week/15 Days Discharge Instructions: Wrap from ankle to calf-patient to adjust if ACE wrap falls 1. Would recommend currently that we going continue with the wound care measures as before and the patient is in agreement with the plan. This includes the use of the Iodosorb which has done a pretty good job for her up to this point. 2. I am also going to recommend ABD pad to cover followed by an Ace wrap to secure in place. We will see patient back for reevaluation in 2 weeks here in the clinic. If anything worsens or changes patient will contact our office for additional recommendations. Electronic Signature(s) Signed: 11/15/2020 5:30:53 PM By: Lenda Kelp PA-C Entered By: Lenda Kelp on 11/15/2020 17:30:53 [place], [name] ([id number]) -------------------------------------------------------------------------------- SuperBill Details Patient Name: TEAGHAN, MELROSE Date of Service: 11/15/2020 Medical Record Number: [id number] Patient Account Number: [id number] Date of Birth/Sex: 1985-12-19 (35 y.o. F) Treating RN: Rogers Blocker Primary Care Provider: Derrel Nip Other Clinician: Referring Provider: Derrel Nip Treating Provider/Extender: Rowan Blase in Treatment: 16 Diagnosis Coding ICD-10 Codes Code Description I87.2 Venous insufficiency (chronic) (peripheral) I89.0 Lymphedema, not elsewhere classified I83.893 Varicose veins of bilateral lower extremities with other complications L98.492 Non-pressure chronic ulcer of skin of other sites with fat layer exposed E66.01 Morbid (severe) obesity due to excess calories Facility Procedures CPT4  Code: 99371696 Description: 99213 - WOUND CARE VISIT-LEV 3 EST PT Modifier: Quantity: 1 Physician Procedures CPT4 Code: 7893810 Description: 99214 - WC PHYS LEVEL 4 - EST PT Modifier: Quantity: 1 CPT4 Code: Description: ICD-10 Diagnosis Description I87.2 Venous insufficiency (chronic) (peripheral) I89.0 Lymphedema, not elsewhere classified I83.893 Varicose veins of bilateral lower extremities with other complications L98.492 Non-pressure chronic ulcer of  skin of other sites with fat layer expos Modifier: ed Quantity: Electronic Signature(s) Signed: 11/15/2020 5:31:10 PM By: Lenda Kelp PA-C Previous Signature: 11/15/2020 4:33:26 PM Version By: Rogers Blocker RN Entered By: Lenda Kelp on 11/15/2020 17:31:10

## 2020-11-15 NOTE — Progress Notes (Addendum)
ANET, LOGSDON ([id number]) Visit Report for 11/15/2020 Arrival Information Details Patient Name: [name], [name]. Date of Service: 11/15/2020 3:45 PM Medical Record Number: [id number] Patient Account Number: [id number] Date of Birth/Sex: 1985/08/19 (35 y.o. F) Treating RN: Dolan Amen Primary Care Noell Shular: Gifford Shave Other Clinician: Referring Barett Whidbee: Gifford Shave Treating Jamina Macbeth/Extender: Skipper Cliche in Treatment: 16 Visit Information History Since Last Visit Pain Present Now: Yes Patient Arrived: Ambulatory Arrival Time: 15:49 Accompanied By: self Transfer Assistance: None Patient Identification Verified: Yes Secondary Verification Process Completed: Yes Patient Requires Transmission-Based Precautions: No Patient Has Alerts: No Electronic Signature(s) Signed: 11/15/2020 4:36:24 PM By: Dolan Amen RN Entered By: Dolan Amen on 11/15/2020 15:50:57 [name] ([id number]) -------------------------------------------------------------------------------- Clinic Level of Care Assessment Details Patient Name: [name] Date of Service: 11/15/2020 3:45 PM Medical Record Number: [id number] Patient Account Number: [id number] Date of Birth/Sex: 1985/07/06 (35 y.o. F) Treating RN: Dolan Amen Primary Care Jadarrius Maselli: Gifford Shave Other Clinician: Referring Rodneisha Bonnet: Gifford Shave Treating Nichol Ator/Extender: Skipper Cliche in Treatment: 16 Clinic Level of Care Assessment Items TOOL 4 Quantity Score X - Use when only an EandM is performed on FOLLOW-UP visit 1 0 ASSESSMENTS - Nursing Assessment / Reassessment X - Reassessment of Co-morbidities (includes updates in patient status) 1 10 X- 1 5 Reassessment of Adherence to Treatment Plan ASSESSMENTS - Wound and Skin Assessment / Reassessment X - Simple Wound Assessment / Reassessment - one wound 1 5 _0  - 0 Complex Wound Assessment / Reassessment - multiple wounds _1  - 0 Dermatologic /  Skin Assessment (not related to wound area) ASSESSMENTS - Focused Assessment _2  - Circumferential Edema Measurements - multi extremities 0 _3  - 0 Nutritional Assessment / Counseling / Intervention _4  - 0 Lower Extremity Assessment (monofilament, tuning fork, pulses) _5  - 0 Peripheral Arterial Disease Assessment (using hand held doppler) ASSESSMENTS - Ostomy and/or Continence Assessment and Care _6  - Incontinence Assessment and Management 0 _7  - 0 Ostomy Care Assessment and Management (repouching, etc.) PROCESS - Coordination of Care X - Simple Patient / Family Education for ongoing care 1 15 _8  - 0 Complex (extensive) Patient / Family Education for ongoing care _9  - 0 Staff obtains Programmer, systems, Records, Test Results / Process Orders _10  - 0 Staff telephones HHA, Nursing Homes / Clarify orders / etc _11  - 0 Routine Transfer to another Facility (non-emergent condition) _12  - 0 Routine Hospital Admission (non-emergent condition) _13  - 0 New Admissions / Biomedical engineer / Ordering NPWT, Apligraf, etc. _14  - 0 Emergency Hospital Admission (emergent condition) X- 1 10 Simple Discharge Coordination _15  - 0 Complex (extensive) Discharge Coordination PROCESS - Special Needs _16  - Pediatric / Minor Patient Management 0 _17  - 0 Isolation Patient Management _18  - 0 Hearing / Language / Visual special needs _19  - 0 Assessment of Community assistance (transportation, D/C planning, etc.) _20  - 0 Additional assistance / Altered mentation _21  - 0 Support Surface(s) Assessment (bed, cushion, seat, etc.) INTERVENTIONS - Wound Cleansing / Measurement [name], [name] ([id number]) X- 1 5 Simple Wound Cleansing - one wound _22  - 0 Complex Wound Cleansing - multiple wounds X- 1 5 Wound Imaging (photographs - any number of wounds) _23  - 0 Wound Tracing (instead of photographs) X- 1 5 Simple Wound Measurement - one wound _24  - 0 Complex Wound Measurement - multiple wounds INTERVENTIONS -  Wound Dressings _25  - Small Wound Dressing one or multiple wounds 0 X- 1 15 Medium Wound Dressing one or multiple wounds _26  - 0 Large Wound  Dressing one or multiple wounds <TDVVOHYWVPXTGGYI>_9<\/SWNIOEVOJJKKXFGH>_8  - 0 Application of Medications - topical <EXHBZJIRCVELFYBO>_1<\/BPZWCHENIDPOEUMP>_5  - 0 Application of Medications - injection INTERVENTIONS - Miscellaneous _2  - External ear exam 0 _3  - 0 Specimen Collection (cultures, biopsies, blood, body fluids, etc.) _4  - 0 Specimen(s) / Culture(s) sent or taken to Lab for analysis _5  - 0 Patient Transfer (multiple staff / Harrel Lemon Lift / Similar devices) _6  - 0 Simple Staple / Suture removal (25 or less) _7  - 0 Complex Staple / Suture removal (26 or more) _8  - 0 Hypo / Hyperglycemic Management (close monitor of Blood Glucose) _9  - 0 Ankle / Brachial Index (ABI) - do not check if billed separately X- 1 5 Vital Signs Has the patient been seen at the hospital within the last three years: Yes Total Score: 80 Level Of Care: New/Established - Level 3 Electronic Signature(s) Signed: 11/15/2020 4:36:24 PM By: Dolan Amen RN Entered By: Dolan Amen on 11/15/2020 16:33:21 [place], [name] ([id number]) -------------------------------------------------------------------------------- Encounter Discharge Information Details Patient Name: [name], [name]. Date of Service: 11/15/2020 3:45 PM Medical Record Number: [id number] Patient Account Number: [id number] Date of Birth/Sex: [birth year] (34 y.o. F) Treating RN: Dolan Amen Primary Care Cerra Eisenhower: Gifford Shave Other Clinician: Referring Angeliyah Kirkey: Gifford Shave Treating Aul Mangieri/Extender: Skipper Cliche in Treatment: 16 Encounter Discharge Information Items Discharge Condition: Stable Ambulatory Status: Ambulatory Discharge Destination: Home Transportation: Private Auto Accompanied By: self Schedule Follow-up Appointment: Yes Clinical Summary of Care: Electronic Signature(s) Signed: 11/15/2020 4:34:16 PM By: Dolan Amen RN Entered By:  Dolan Amen on 11/15/2020 16:34:16 Khokhar, [name] ([id number]) -------------------------------------------------------------------------------- Lower Extremity Assessment Details Patient Name: [name], [name] Date of Service: 11/15/2020 3:45 PM Medical Record Number: [id number] Patient Account Number: [id number] Date of Birth/Sex: 28-Nov-1985 (35 y.o. F) Treating RN: Dolan Amen Primary Care Tytus Strahle: Gifford Shave Other Clinician: Referring Makaylah Oddo: Gifford Shave Treating Sevon Rotert/Extender: Jeri Cos Weeks in Treatment: 16 Edema Assessment Assessed: Shirlyn Goltz: Yes] Patrice Paradise: No] Edema: [Left: SN] [Right: s] Vascular Assessment Pulses: Dorsalis Pedis Palpable: [Left:Yes] Electronic Signature(s) Signed: 11/15/2020 4:36:24 PM By: Dolan Amen RN Entered By: Dolan Amen on 11/15/2020 16:01:50 Saye, [name] ([id number]) -------------------------------------------------------------------------------- Multi Wound Chart Details Patient Name: [name], [name] Date of Service: 11/15/2020 3:45 PM Medical Record Number: [id number] Patient Account Number: [id number] Date of Birth/Sex: 12-Apr-1986 (35 y.o. F) Treating RN: Dolan Amen Primary Care Ronin Crager: Gifford Shave Other Clinician: Referring Jamesen Stahnke: Gifford Shave Treating Shawntel Farnworth/Extender: Skipper Cliche in Treatment: 16 Vital Signs Height(in): 66 Pulse(bpm): 99 Weight(lbs): 388 Blood Pressure(mmHg): 115/77 Body Mass Index(BMI): 63 Temperature(F): 98.8 Respiratory Rate(breaths/min): 18 Photos: [N/A:N/A] Wound Location: Left, Distal, Medial Lower Leg N/A N/A Wounding Event: Gradually Appeared N/A N/A Primary Etiology: Venous Leg Ulcer N/A N/A Secondary Etiology: Lymphedema N/A N/A Comorbid History: Hypertension, Peripheral Venous N/A N/A Disease Date Acquired: 07/18/2020 N/A N/A Weeks of Treatment: 16 N/A N/A Wound Status: Open N/A N/A Measurements L x W x D (cm) 0.1x0.1x0.1 N/A N/A Area (cm)  : 0.008 N/A N/A Volume (cm) : 0.001 N/A N/A % Reduction in Area: 74.20% N/A N/A % Reduction in Volume: 66.70% N/A N/A Classification: Full Thickness Without Exposed N/A N/A Support Structures Exudate Amount: Small N/A N/A Exudate Type: Serosanguineous N/A N/A Exudate Color: red, brown N/A N/A Wound Margin: Flat and Intact N/A N/A Granulation Amount: Large (67-100%) N/A N/A Granulation Quality: Pink N/A N/A Necrotic Amount: Small (1-33%) N/A N/A Exposed Structures: Fat Layer (Subcutaneous Tissue): N/A N/A Yes Fascia: No Tendon: No Muscle: No Joint: No Bone:  No Epithelialization: Medium (34-66%) N/A N/A Treatment Notes Electronic Signature(s) Signed: 11/15/2020 4:36:24 PM By: Dolan Amen RN Entered By: Dolan Amen on 11/15/2020 16:16:18 [place], [name] ([id number]) -------------------------------------------------------------------------------- [place] Details Patient Name: [name], [name]. Date of Service: 11/15/2020 3:45 PM Medical Record Number: [id number] Patient Account Number: [id number] Date of Birth/Sex: 1985-12-24 (35 y.o. F) Treating RN: Dolan Amen Primary Care Matix Henshaw: Gifford Shave Other Clinician: Referring Rudolf Blizard: Gifford Shave Treating Syon Tews/Extender: Skipper Cliche in Treatment: 16 Active Inactive Wound/Skin Impairment Nursing Diagnoses: Impaired tissue integrity Goals: Patient/caregiver will verbalize understanding of skin care regimen Date Initiated: 07/26/2020 Date Inactivated: 09/19/2020 Target Resolution Date: 07/26/2020 Goal Status: Met Ulcer/skin breakdown will have a volume reduction of 30% by week 4 Date Initiated: 07/26/2020 Date Inactivated: 09/19/2020 Target Resolution Date: 08/25/2020 Goal Status: Met Ulcer/skin breakdown will have a volume reduction of 50% by week 8 Date Initiated: 07/26/2020 Target Resolution Date: 09/25/2020 Goal Status: Active Ulcer/skin breakdown will have a volume reduction of  80% by week 12 Date Initiated: 07/26/2020 Target Resolution Date: 10/25/2020 Goal Status: Active Ulcer/skin breakdown will heal within 14 weeks Date Initiated: 07/26/2020 Target Resolution Date: 11/25/2020 Goal Status: Active Interventions: Assess patient/caregiver ability to obtain necessary supplies Assess patient/caregiver ability to perform ulcer/skin care regimen upon admission and as needed Assess ulceration(s) every visit Provide education on ulcer and skin care Treatment Activities: Referred to DME Jahmiya Guidotti for dressing supplies : 07/26/2020 Skin care regimen initiated : 07/26/2020 Notes: Electronic Signature(s) Signed: 11/15/2020 4:36:24 PM By: Dolan Amen RN Entered By: Dolan Amen on 11/15/2020 [place], [name] ([id number]) -------------------------------------------------------------------------------- Pain Assessment Details Patient Name: [name], [name] Date of Service: 11/15/2020 3:45 PM Medical Record Number: [id number] Patient Account Number: [id number] Date of Birth/Sex: 03/08/1986 (35 y.o. F) Treating RN: Dolan Amen Primary Care Jael Kostick: Gifford Shave Other Clinician: Referring Avenir Lozinski: Gifford Shave Treating Kadesha Virrueta/Extender: Skipper Cliche in Treatment: 16 Active Problems Location of Pain Severity and Description of Pain Patient Has Paino Yes Site Locations Pain Location: Pain in Ulcers Rate the pain. Current Pain Level: 5 Character of Pain Describe the Pain: Dull Pain Management and Medication Current Pain Management: Electronic Signature(s) Signed: 11/15/2020 4:36:24 PM By: Dolan Amen RN Entered By: Dolan Amen on 11/15/2020 15:52:26 [place], [name] ([id number]) -------------------------------------------------------------------------------- Patient/Caregiver Education Details Patient Name: [name], [name]. Date of Service: 11/15/2020 3:45 PM Medical Record Number: [id number] Patient Account Number:  [id number] Date of Birth/Gender: [birth year] (35 y.o. F) Treating RN: Dolan Amen Primary Care Physician: Gifford Shave Other Clinician: Referring Physician: Gifford Shave Treating Physician/Extender: Skipper Cliche in Treatment: 16 Education Assessment Education Provided To: Patient Education Topics Provided Wound/Skin Impairment: Methods: Explain/Verbal Responses: State content correctly Electronic Signature(s) Signed: 11/15/2020 4:36:24 PM By: Dolan Amen RN Entered By: Dolan Amen on 11/15/2020 16:33:33 [place], [name] ([id number]) -------------------------------------------------------------------------------- Wound Assessment Details Patient Name: [name], [name] Date of Service: 11/15/2020 3:45 PM Medical Record Number: [id number] Patient Account Number: [id number] Date of Birth/Sex: 1985-06-18 (35 y.o. F) Treating RN: Dolan Amen Primary Care Ahad Colarusso: Gifford Shave Other Clinician: Referring Tatijana Bierly: Gifford Shave Treating Bandon Sherwin/Extender: Skipper Cliche in Treatment: 16 Wound Status Wound Number: 3 Primary Etiology: Venous Leg Ulcer Wound Location: Left, Distal, Medial Lower Leg Secondary Etiology: Lymphedema Wounding Event: Gradually Appeared Wound Status: Open Date Acquired: 07/18/2020 Comorbid History: Hypertension, Peripheral Venous Disease Weeks Of Treatment: 16 Clustered Wound: No Photos Wound Measurements Length: (cm) 0.1 Width: (cm) 0.1 Depth: (cm) 0.1 Area: (cm) 0.008 Volume: (  cm) 0.001 % Reduction in Area: 74.2% % Reduction in Volume: 66.7% Epithelialization: Medium (34-66%) Tunneling: No Undermining: No Wound Description Classification: Full Thickness Without Exposed Support Structures Wound Margin: Flat and Intact Exudate Amount: Small Exudate Type: Serosanguineous Exudate Color: red, brown Foul Odor After Cleansing: No Slough/Fibrino Yes Wound Bed Granulation Amount: Large (67-100%) Exposed  Structure Granulation Quality: Pink Fascia Exposed: No Necrotic Amount: Small (1-33%) Fat Layer (Subcutaneous Tissue) Exposed: Yes Necrotic Quality: Adherent Slough Tendon Exposed: No Muscle Exposed: No Joint Exposed: No Bone Exposed: No Treatment Notes Wound #3 (Lower Leg) Wound Laterality: Left, Medial, Distal Cleanser Soap and Water Discharge Instruction: Gently cleanse wound with antibacterial soap, rinse and pat dry prior to dressing wounds Peri-Wound Care MIKEALA, GIRDLER ([id number]) Topical Primary Dressing Iodosorb 40 (g) Discharge Instruction: Apply IodoSorb to wound bed only as directed. Secondary Dressing ABD Pad 5x9 (in/in) Discharge Instruction: Cover with ABD pad Secured With 52M ACE Elastic Bandage With VELCRO Brand Closure, 4 (in) Discharge Instruction: Wrap from ankle to calf-patient to adjust if ACE wrap falls Compression Wrap Compression Stockings Add-Ons Electronic Signature(s) Signed: 11/15/2020 4:36:24 PM By: Dolan Amen RN Entered By: Dolan Amen on 11/15/2020 16:01:05 [place], [name] ([id number]) -------------------------------------------------------------------------------- Vitals Details Patient Name: [name], [name] Date of Service: 11/15/2020 3:45 PM Medical Record Number: [id number] Patient Account Number: [id number] Date of Birth/Sex: 1986-03-16 (35 y.o. F) Treating RN: Dolan Amen Primary Care Faheem Ziemann: Gifford Shave Other Clinician: Referring Yanessa Hocevar: Gifford Shave Treating Daylah Sayavong/Extender: Skipper Cliche in Treatment: 16 Vital Signs Time Taken: 15:51 Temperature (F): 98.8 Height (in): 66 Pulse (bpm): 99 Weight (lbs): 388 Respiratory Rate (breaths/min): 18 Body Mass Index (BMI): 62.6 Blood Pressure (mmHg): 115/77 Reference Range: 80 - 120 mg / dl Electronic Signature(s) Signed: 11/15/2020 4:36:24 PM By: Dolan Amen RN Entered By: Dolan Amen on 11/15/2020 15:51:51

## 2020-11-17 ENCOUNTER — Ambulatory Visit: Payer: BC Managed Care – PPO | Admitting: Physician Assistant

## 2020-11-28 ENCOUNTER — Other Ambulatory Visit: Payer: Self-pay

## 2020-11-28 ENCOUNTER — Encounter: Payer: BC Managed Care – PPO | Admitting: Physician Assistant

## 2020-11-28 DIAGNOSIS — E11622 Type 2 diabetes mellitus with other skin ulcer: Secondary | ICD-10-CM | POA: Diagnosis not present

## 2020-11-28 NOTE — Progress Notes (Addendum)
AURORAH, SCHLACHTER ([id number]) Visit Report for 11/28/2020 Chief Complaint Document Details Patient Name: [name], [name]. Date of Service: 11/28/2020 2:00 PM Medical Record Number: [id number] Patient Account Number: [id number] Date of Birth/Sex: Jul 18, 1985 (35 y.o. F) Treating RN: Rogers Blocker Primary Care Provider: Derrel Nip Other Clinician: Referring Provider: Derrel Nip Treating Provider/Extender: Rowan Blase in Treatment: 17 Information Obtained from: Patient Chief Complaint Left LE Ulcer Electronic Signature(s) Signed: 11/28/2020 2:42:43 PM By: Lenda Kelp PA-C Entered By: Lenda Kelp on 11/28/2020 14:42:43 Rocco, [name] ([id number]) -------------------------------------------------------------------------------- HPI Details Patient Name: [name], [name] Date of Service: 11/28/2020 2:00 PM Medical Record Number: [id number] Patient Account Number: [id number] Date of Birth/Sex: 12-07-1985 (35 y.o. F) Treating RN: Rogers Blocker Primary Care Provider: Derrel Nip Other Clinician: Referring Provider: Derrel Nip Treating Provider/Extender: Rowan Blase in Treatment: 17 History of Present Illness HPI Description: 35 year old patient who started with having ulcerations on the right lower leg on the lateral part of her ankle for about 2 weeks. She was seen in the ER at [facility] and advised to see the wound care for a consultation. No X-rays of workup was done during the ER visit and no prescription for any medications of compression wraps were given. the patient is not diabetic but does have hypertension and her medications have been reviewed by me. 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 The right and left greater saphenous vein was found to be tortuous. Deep venous  system was also not competent and there was reflux of more than 500 ms. She was then seen by Dr. Tawanna Cooler Early who recommended that the patient would not benefit from endovenous ablation and he had recommended vein stripping on the right side and multiple small phlebectomy procedures on the left side. the patient did not follow-up due to social economic reasons. She has not been wearing any compression stockings and has not taken any specific treatment for varicose veins for the last 3 years. 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 We have obtained some appointments to see the vascular surgeons by the end of the month and the patient would like to follow up with me at my [facility] on Wednesday, June 29. 10/14/2014 -- she could not see me yesterday in [place] and hence has come for a review today. She has a vascular workup to be done this afternoon at [facility]. She is doing fine otherwise. 10/22/2014 -- she was seen by Dr. [name] and he has recommended surgical removal of her right saphenous vein from distal thigh to saphenofemoral junction and stab phlebectomy's of multiple large tributary branches throughout her thigh and calf. This would be done under general anesthesia in the outpatient setting. 10/29/2014 -- she is trying to work on a surgical date and in the meanwhile we have got insurance clearance for Apligraf and we will start this next week. 7/22 2016 -- she is here for the first application of Apligraf. 11/19/2014 -- she is here for a second application of Apligraf 11/26/2014 -- she has done fine after her last application of Apligraf and is awaiting her surgery which is scheduled for August 31. 12/03/2014 -- she is doing fine and is here for her third application  of Apligraf. 12/21/2014 -- She had surgery on 12/15/2014 by Dr.Early who did #1 ligation and stripping of  right great saphenous vein from distal thigh to saphenofemoral junction, #2 stab phlebectomy of large tributary varicose veins in the thigh popliteal space and calf. She had an Ace wrap up to her groin and this was removed today and the Unna's boot was also removed. 12/28/2014 -- she is here for her fourth application of Apligraf. 01/06/2015 - he saw her vascular surgeon Dr. Arbie Cookey who was pleased with her progress and he has confirmed that no surgical procedures could be attempted on the left side. 01/13/2015 -- her wound looks very good and she's been having no problems whatsoever. Readmission: 07/26/2020 upon evaluation today patient presents for initial inspection here in our clinic for a new issue with her left leg although she is previously been seen due to issues with the right leg back in 2016. At that time she was seeing Dr. Arbie Cookey who is a vein/vascular specialist in [place]. He has since semiretired from what I understand. He is working out of [employer] I believe. Nonetheless she tells me at the time that there was really nothing to do for her left leg although the right leg was where they did most of the work. Subsequently she states that she is done fairly well until just in the past week where she had issues with bleeding from what appears to be varicose vein on the left leg medially. Unfortunately this has continued to be an issue although she tells me at first it was coming much more significantly Down quite a bit but nonetheless has not completely resolved. Every time she showers she notices that it starts to drain a little bit more. She does have a history of chronic venous insufficiency, lymphedema, varicose veins bilaterally, and obesity. 08/02/2020 upon evaluation today patient appears to be doing about the same in regards to the ulcer on her left leg. She has some eschar covering there is definitely some fluid collecting underneath unfortunately. With that being said she tells  me she is still having a tremendous amount of pain therefore she is really not able to allow me to clean this off very effectively to be perfectly honest. I think we need to try to soften this up 08/16/2020 upon evaluation today patient's wound is really not doing significantly better not really states about the same. She notes that the wrap just does not seem to be staying up very well at all unfortunately. No fevers, chills, nausea, vomiting, or diarrhea. She did cut it off once it starts to slide in order to alleviate some of the pressure from sliding Down. Fortunately there is no signs of active infection at this time which is great news. 08/23/2020 upon evaluation today patient appears to be doing well 08/23/2020 upon evaluation today patient appears to be doing well with regard to her wound all things considered. Fortunately there does not appear to be any signs of active infection at this time which is great news. She has been tolerating the dressing changes without complication and overall I am extremely pleased with where things stand at this point. She does have her appointment with vascular in [facility] on June 9. SERENNA, DEROY ([id number]) 08/30/2020 upon evaluation today patient actually appears to be doing decently well in regard to her wound. Fortunately there is no signs of active infection which is great news. Nonetheless I do believe that the patient is going require little bit of debridement if  she is okay with me attempting that today I think that will help clean off some of the necrotic tissue. Fortunately there does not appear to be otherwise any evidence of active infection which is also great news. 09/19/2020 upon evaluation today patient appears to be doing a little better in regard to her wound as compared to previous. Fortunately there does not appear to be any signs of active infection overall. No fever chills noted. I do believe that the Iodosorb has made this a little bit  better with regard to the overall size and appearance of the wound bed though again she does still have quite a ways to go to get this to heal she still very tender to touch. 09/27/2020 upon evaluation today patient appears to actually be doing quite well with regard to her wound. This is measuring much smaller which is great news. With that being said she did [facility] and they subsequently recommended that surgery is really what she probably needs to go forward with sounds like the potential for venous ablation. With that being said the patient tells me this is just not the right time for her to be able to proceed with any type of surgery which I completely understand. Nonetheless I do believe that she would continue to benefit from compression but again that is really not something that she is able to easily do. 10/04/2020 upon evaluation today patient appears to be doing about the same in regard to her wound. This is measuring a little bit smaller but nonetheless still is open and again has some slough and biofilm noted on the surface of the wound. There does not appear to be any signs of active infection which is great news. No fevers, chills, nausea, vomiting, or diarrhea. 10/04/2020 upon evaluation today patient appears to be doing well with regard to her wound. She has been tolerating dressing changes without complication. Fortunately there does not appear to be any signs of active infection which is great news. No fevers, chills, nausea, vomiting, or diarrhea. 10/25/2020 upon evaluation today patient appears to be doing well with regard to her wound. She has been tolerating the dressing changes without complication. Fortunately there is no signs of active infection at this time. No fevers, chills, nausea, vomiting, or diarrhea. 11/01/2020 upon evaluation today patient with regard to her wound. She has been tolerating the dressing changes without complication. Fortunately  there does not appear to be any signs of infection which is great news. No fever chills noted 11/15/2020 upon evaluation today patient appears to be doing well with regard to her wound. Fortunately there is no signs of active infection at this time. No fevers, chills, nausea, vomiting, or diarrhea. With that being said she continues to have a significant amount of pain at the site even though this is very close to complete closure. She also had several varicose veins around the area which were also problematic. Overall however I feel like the patient is making excellent progress. 11/28/2020 upon evaluation today patient appears to be doing well with regard to her leg ulcer. Again were not really able to debride or compression wrap her due to discomfort and pain. She does not allow for that. With that being said we have been using Iodosorb which does seem to be doing decently well. Fortunately there is no signs of active infection at this time which is great news. No fevers, chills, nausea, vomiting, or diarrhea. Electronic Signature(s) Signed: 11/28/2020 2:52:32 PM By:  Linwood Dibbles, Murel Shenberger PA-C Entered By: Lenda Kelp on 11/28/2020 14:52:31 Hawker, [name] ([id number]) -------------------------------------------------------------------------------- Physical Exam Details Patient Name: ANN-MARIE, KLUGE. Date of Service: 11/28/2020 2:00 PM Medical Record Number: [id number] Patient Account Number: [id number] Date of Birth/Sex: 1986/04/12 (35 y.o. F) Treating RN: Rogers Blocker Primary Care Provider: Derrel Nip Other Clinician: Referring Provider: Derrel Nip Treating Provider/Extender: Rowan Blase in Treatment: 17 Constitutional Well-nourished and well-hydrated in no acute distress. Respiratory normal breathing without difficulty. Psychiatric this patient is able to make decisions and demonstrates good insight into disease process. Alert and Oriented x 3. pleasant and  cooperative. Notes Upon inspection patient's wound bed showed signs of good granulation epithelization at this point. There does not appear to be any signs of active infection which is great news and overall very pleased with where things stand today. No fevers, chills, nausea, vomiting, or diarrhea. Electronic Signature(s) Signed: 11/28/2020 2:52:59 PM By: Lenda Kelp PA-C Entered By: Lenda Kelp on 11/28/2020 14:52:58 Harney, [name] ([id number]) -------------------------------------------------------------------------------- Physician Orders Details Patient Name: MAITLYN, PENZA Date of Service: 11/28/2020 2:00 PM Medical Record Number: [id number] Patient Account Number: [id number] Date of Birth/Sex: 1985-07-07 (35 y.o. F) Treating RN: Rogers Blocker Primary Care Provider: Derrel Nip Other Clinician: Referring Provider: Derrel Nip Treating Provider/Extender: Rowan Blase in Treatment: 17 Verbal / Phone Orders: No Diagnosis Coding ICD-10 Coding Code Description I87.2 Venous insufficiency (chronic) (peripheral) I89.0 Lymphedema, not elsewhere classified I83.893 Varicose veins of bilateral lower extremities with other complications L98.492 Non-pressure chronic ulcer of skin of other sites with fat layer exposed E66.01 Morbid (severe) obesity due to excess calories Follow-up Appointments o Return Appointment in 2 weeks. Bathing/ Shower/ Hygiene o Wash wounds with antibacterial soap and water. Edema Control - Lymphedema / Segmental Compressive Device / Other Left Lower Extremity o Elevate, Exercise Daily and Avoid Standing for Long Periods of Time. o Elevate legs to the level of the heart and pump ankles as often as possible o Elevate leg(s) parallel to the floor when sitting. Wound Treatment Wound #3 - Lower Leg Wound Laterality: Left, Medial, Distal Cleanser: Soap and Water 3 x Per Week/15 Days Discharge Instructions: Gently cleanse wound with  antibacterial soap, rinse and pat dry prior to dressing wounds Primary Dressing: Iodosorb 40 (g) (Generic) 3 x Per Week/15 Days Discharge Instructions: Apply IodoSorb to wound bed only as directed. Secondary Dressing: ABD Pad 5x9 (in/in) (Generic) 3 x Per Week/15 Days Discharge Instructions: Cover with ABD pad Secured With: 15M ACE Elastic Bandage With VELCRO Brand Closure, 4 (in) (Generic) 3 x Per Week/15 Days Discharge Instructions: Wrap from ankle to calf-patient to adjust if ACE wrap falls Electronic Signature(s) Signed: 11/28/2020 4:23:01 PM By: Lenda Kelp PA-C Signed: 11/28/2020 4:38:42 PM By: Rogers Blocker RN Entered By: Rogers Blocker on 11/28/2020 14:51:25 Pajak, [name] ([id number]) -------------------------------------------------------------------------------- Problem List Details Patient Name: VANDELLA, ORD. Date of Service: 11/28/2020 2:00 PM Medical Record Number: [id number] Patient Account Number: [id number] Date of Birth/Sex: 1985/04/23 (35 y.o. F) Treating RN: Rogers Blocker Primary Care Provider: Derrel Nip Other Clinician: Referring Provider: Derrel Nip Treating Provider/Extender: Rowan Blase in Treatment: 17 Active Problems ICD-10 Encounter Code Description Active Date MDM Diagnosis I87.2 Venous insufficiency (chronic) (peripheral) 07/26/2020 No Yes I89.0 Lymphedema, not elsewhere classified 07/26/2020 No Yes I83.893 Varicose veins of bilateral lower extremities with other complications 07/26/2020 No Yes L98.492 Non-pressure chronic ulcer of skin of other sites with fat layer exposed 07/26/2020 No Yes E66.01 Morbid (severe)  obesity due to excess calories 07/26/2020 No Yes Inactive Problems Resolved Problems Electronic Signature(s) Signed: 11/28/2020 2:42:36 PM By: Lenda Kelp PA-C Entered By: Lenda Kelp on 11/28/2020 14:42:36 Moraes, [name]  ([id number]) -------------------------------------------------------------------------------- Progress Note Details Patient Name: ALVA, KUENZEL Date of Service: 11/28/2020 2:00 PM Medical Record Number: [id number] Patient Account Number: [id number] Date of Birth/Sex: [birth year] (35 y.o. F) Treating RN: Rogers Blocker Primary Care Provider: Derrel Nip Other Clinician: Referring Provider: Derrel Nip Treating Provider/Extender: Rowan Blase in Treatment: 17 Subjective Chief Complaint Information obtained from Patient Left LE Ulcer History of Present Illness (HPI) 35 year old patient who started with having ulcerations on the right lower leg on the lateral part of her ankle for about 2 weeks. She was seen in the ER at [facility] and advised to see the wound care for a consultation. No X-rays of workup was done during the ER visit and no prescription for any medications of compression wraps were given. the patient is not diabetic but does have hypertension and her medications have been reviewed by me. 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 The right and left greater saphenous vein was found to be tortuous. Deep venous system was also not competent and there was reflux of more than 500 ms. She was then seen by Dr. Tawanna Cooler Early who recommended that the patient would not benefit from endovenous ablation and he had recommended vein stripping on the right side and multiple small phlebectomy procedures on the left side. the patient did not follow-up due to social economic reasons. She has not been wearing any compression stockings and has not taken any specific treatment for varicose veins for the last 3 years. 09/27/2014 -- She has developed a new wound on the medial malleolus which is rather superficial and in  the area where she has stasis dermatitis. We have obtained some appointments to see the vascular surgeons by the end of the month and the patient would like to follow up with me at my [facility] on Wednesday, June 29. 10/14/2014 -- she could not see me yesterday in [place] and hence has come for a review today. She has a vascular workup to be done this afternoon at [facility]. She is doing fine otherwise. 10/22/2014 -- she was seen by Dr. [name] and he has recommended surgical removal of her right saphenous vein from distal thigh to saphenofemoral junction and stab phlebectomy's of multiple large tributary branches throughout her thigh and calf. This would be done under general anesthesia in the outpatient setting. 10/29/2014 -- she is trying to work on a surgical date and in the meanwhile we have got insurance clearance for Apligraf and we will start this next week. 7/22 2016 -- she is here for the first application of Apligraf. 11/19/2014 -- she is here for a second application of Apligraf 11/26/2014 -- she has done fine after her last application of Apligraf and is awaiting her surgery which is scheduled for August 31. 12/03/2014 -- she is doing fine and is here for her third application of Apligraf. 12/21/2014 -- She had surgery on 12/15/2014 by Dr.Early who did #1 ligation and stripping of right great saphenous vein from distal thigh to saphenofemoral junction, #2 stab phlebectomy of large tributary varicose veins in the thigh popliteal space and calf. She had an Ace  wrap up to her groin and this was removed today and the Unna's boot was also removed. 12/28/2014 -- she is here for her fourth application of Apligraf. 01/06/2015 - he saw her vascular surgeon Dr. Donnetta Hutching who was pleased with her progress and he has confirmed that no surgical procedures could be attempted on the left side. 01/13/2015 -- her wound looks very good and she's been having no problems  whatsoever. Readmission: 07/26/2020 upon evaluation today patient presents for initial inspection here in our clinic for a new issue with her left leg although she is previously been seen due to issues with the right leg back in 2016. At that time she was seeing Dr. Donnetta Hutching who is a vein/vascular specialist in [employer]. He has since semiretired from what I understand. He is working out of [employer] I believe. Nonetheless she tells me at the time that there was really nothing to do for her left leg although the right leg was where they did most of the work. Subsequently she states that she is done fairly well until just in the past week where she had issues with bleeding from what appears to be varicose vein on the left leg medially. Unfortunately this has continued to be an issue although she tells me at first it was coming much more significantly Down quite a bit but nonetheless has not completely resolved. Every time she showers she notices that it starts to drain a little bit more. She does have a history of chronic venous insufficiency, lymphedema, varicose veins bilaterally, and obesity. 08/02/2020 upon evaluation today patient appears to be doing about the same in regards to the ulcer on her left leg. She has some eschar covering there is definitely some fluid collecting underneath unfortunately. With that being said she tells me she is still having a tremendous amount of pain therefore she is really not able to allow me to clean this off very effectively to be perfectly honest. I think we need to try to soften this up 08/16/2020 upon evaluation today patient's wound is really not doing significantly better not really states about the same. She notes that the wrap just does not seem to be staying up very well at all unfortunately. No fevers, chills, nausea, vomiting, or diarrhea. She did cut it off once it starts to slide in order to alleviate some of the pressure from sliding Down. Fortunately  there is no signs of active infection at this time which is great news. ISHANVI, MCQUITTY ([id number]) 08/23/2020 upon evaluation today patient appears to be doing well 08/23/2020 upon evaluation today patient appears to be doing well with regard to her wound all things considered. Fortunately there does not appear to be any signs of active infection at this time which is great news. She has been tolerating the dressing changes without complication and overall I am extremely pleased with where things stand at this point. She does have her appointment with vascular in [facility] on June 9. 08/30/2020 upon evaluation today patient actually appears to be doing decently well in regard to her wound. Fortunately there is no signs of active infection which is great news. Nonetheless I do believe that the patient is going require little bit of debridement if she is okay with me attempting that today I think that will help clean off some of the necrotic tissue. Fortunately there does not appear to be otherwise any evidence of active infection which is also great news. 09/19/2020 upon evaluation today patient appears to be doing  a little better in regard to her wound as compared to previous. Fortunately there does not appear to be any signs of active infection overall. No fever chills noted. I do believe that the Iodosorb has made this a little bit better with regard to the overall size and appearance of the wound bed though again she does still have quite a ways to go to get this to heal she still very tender to touch. 09/27/2020 upon evaluation today patient appears to actually be doing quite well with regard to her wound. This is measuring much smaller which is great news. With that being said she did see vein and vascular in [facility] and they subsequently recommended that surgery is really what she probably needs to go forward with sounds like the potential for venous ablation. With that being said the patient  tells me this is just not the right time for her to be able to proceed with any type of surgery which I completely understand. Nonetheless I do believe that she would continue to benefit from compression but again that is really not something that she is able to easily do. 10/04/2020 upon evaluation today patient appears to be doing about the same in regard to her wound. This is measuring a little bit smaller but nonetheless still is open and again has some slough and biofilm noted on the surface of the wound. There does not appear to be any signs of active infection which is great news. No fevers, chills, nausea, vomiting, or diarrhea. 10/04/2020 upon evaluation today patient appears to be doing well with regard to her wound. She has been tolerating dressing changes without complication. Fortunately there does not appear to be any signs of active infection which is great news. No fevers, chills, nausea, vomiting, or diarrhea. 10/25/2020 upon evaluation today patient appears to be doing well with regard to her wound. She has been tolerating the dressing changes without complication. Fortunately there is no signs of active infection at this time. No fevers, chills, nausea, vomiting, or diarrhea. 11/01/2020 upon evaluation today patient with regard to her wound. She has been tolerating the dressing changes without complication. Fortunately there does not appear to be any signs of infection which is great news. No fever chills noted 11/15/2020 upon evaluation today patient appears to be doing well with regard to her wound. Fortunately there is no signs of active infection at this time. No fevers, chills, nausea, vomiting, or diarrhea. With that being said she continues to have a significant amount of pain at the site even though this is very close to complete closure. She also had several varicose veins around the area which were also problematic. Overall however I feel like the patient is making excellent  progress. 11/28/2020 upon evaluation today patient appears to be doing well with regard to her leg ulcer. Again were not really able to debride or compression wrap her due to discomfort and pain. She does not allow for that. With that being said we have been using Iodosorb which does seem to be doing decently well. Fortunately there is no signs of active infection at this time which is great news. No fevers, chills, nausea, vomiting, or diarrhea. Objective Constitutional Well-nourished and well-hydrated in no acute distress. Vitals Time Taken: 2:29 PM, Height: 66 in, Weight: 388 lbs, BMI: 62.6, Temperature: 98.8 F, Pulse: 101 bpm, Respiratory Rate: 18 breaths/min, Blood Pressure: 111/77 mmHg. Respiratory normal breathing without difficulty. Psychiatric this patient is able to make decisions and demonstrates good insight into  disease process. Alert and Oriented x 3. pleasant and cooperative. General Notes: Upon inspection patient's wound bed showed signs of good granulation epithelization at this point. There does not appear to be any signs of active infection which is great news and overall very pleased with where things stand today. No fevers, chills, nausea, vomiting, or diarrhea. Integumentary (Hair, Skin) Wound #3 status is Open. Original cause of wound was Gradually Appeared. The date acquired was: 07/18/2020. The wound has been in treatment 17 weeks. The wound is located on the Left,Distal,Medial Lower Leg. The wound measures 0.1cm length x 0.1cm width x 0.1cm depth; 0.008cm^2 area and 0.001cm^3 volume. There is Fat Layer (Subcutaneous Tissue) exposed. There is no tunneling or undermining noted. There is a small amount of serous drainage noted. The wound margin is flat and intact. There is no granulation within the wound bed. There is no necrotic tissue within the wound bed. 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 Bathing/ Shower/ Hygiene: Wash wounds with antibacterial soap and water. Edema Control - Lymphedema / Segmental Compressive Device / Other: Elevate, Exercise Daily and Avoid Standing for Long Periods of Time. Elevate legs to the level of the heart and pump ankles as often as possible Elevate leg(s) parallel to the floor when sitting. WOUND #3: - Lower Leg Wound Laterality: Left, Medial, Distal Cleanser: Soap and Water 3 x Per Week/15 Days Discharge Instructions: Gently cleanse wound with antibacterial soap, rinse and pat dry prior to dressing wounds Primary Dressing: Iodosorb 40 (g) (Generic) 3 x Per Week/15 Days Discharge Instructions: Apply IodoSorb to wound bed only as directed. Secondary Dressing: ABD Pad 5x9 (in/in) (Generic) 3 x Per Week/15 Days Discharge Instructions: Cover with ABD pad Secured With: 51M ACE Elastic Bandage With VELCRO Brand Closure, 4 (in) (Generic) 3 x Per Week/15 Days Discharge Instructions: Wrap from ankle to calf-patient to adjust if ACE wrap falls 1. Would recommend currently that we go ahead and continue with the wound care measures as before and the patient is in agreement with the plan. This includes the use of the Iodosorb which I do believe is doing a great job for her. 2. I am also can recommend that we have the patient continue to use an ABD pad and roll gauze to secure in place. 3. I am also going to recommend the patient continue to elevate her legs much as possible to help with edema control. I think that still of utmost importance. We will see patient back for reevaluation in 1 week here in the clinic. If anything worsens or changes patient will contact  our office for additional recommendations. Electronic Signature(s) Signed: 11/28/2020 2:56:54 PM By: Lenda Kelp PA-C Entered By: Lenda Kelp on 11/28/2020 14:56:53 Oberman, [name] ([id number]) -------------------------------------------------------------------------------- SuperBill Details Patient Name: XYLA, LEISNER Date of Service: 11/28/2020 Medical Record Number: [id number] Patient Account Number: [id number] Date of Birth/Sex: Mar 24, 1986 (35 y.o. F) Treating RN: Rogers Blocker Primary Care Provider: Derrel Nip Other Clinician: Referring Provider: Derrel Nip Treating Provider/Extender: Rowan Blase in Treatment: 17 Diagnosis Coding ICD-10 Codes Code Description I87.2 Venous insufficiency (chronic) (peripheral) I89.0 Lymphedema, not elsewhere classified I83.893 Varicose veins of bilateral lower extremities with other complications L98.492 Non-pressure chronic ulcer of skin of  other sites with fat layer exposed E66.01 Morbid (severe) obesity due to excess calories Facility Procedures CPT4 Code: 60454098 Description: 99213 - WOUND CARE VISIT-LEV 3 EST PT Modifier: Quantity: 1 Physician Procedures CPT4 Code: 1191478 Description: 99214 - WC PHYS LEVEL 4 - EST PT Modifier: Quantity: 1 CPT4 Code: Description: ICD-10 Diagnosis Description I87.2 Venous insufficiency (chronic) (peripheral) I89.0 Lymphedema, not elsewhere classified I83.893 Varicose veins of bilateral lower extremities with other complications L98.492 Non-pressure chronic ulcer of  skin of other sites with fat layer expos Modifier: ed Quantity: Electronic Signature(s) Signed: 11/28/2020 2:57:13 PM By: Lenda Kelp PA-C Entered By: Lenda Kelp on 11/28/2020 14:57:13

## 2020-11-28 NOTE — Progress Notes (Signed)
DUA, MEHLER ([id number]) Visit Report for 11/28/2020 Arrival Information Details Patient Name: [name], [name]. Date of Service: 11/28/2020 2:00 PM Medical Record Number: [id number] Patient Account Number: [id number] Date of Birth/Sex: 1985/08/07 (35 y.o. F) Treating RN: Dolan Amen Primary Care Dellie Piasecki: Gifford Shave Other Clinician: Referring Donika Butner: Gifford Shave Treating Shontavia Mickel/Extender: Skipper Cliche in Treatment: 63 Visit Information History Since Last Visit Pain Present Now: No Patient Arrived: Ambulatory Arrival Time: 14:29 Accompanied By: self Transfer Assistance: None Patient Identification Verified: Yes Secondary Verification Process Completed: Yes Patient Requires Transmission-Based Precautions: No Patient Has Alerts: No Electronic Signature(s) Signed: 11/28/2020 4:38:42 PM By: Dolan Amen RN Entered By: Dolan Amen on 11/28/2020 14:29:16 Durflinger, Herminio Heads ([id number]) -------------------------------------------------------------------------------- Clinic Level of Care Assessment Details Patient Name: [name] Date of Service: 11/28/2020 2:00 PM Medical Record Number: [id number] Patient Account Number: [id number] Date of Birth/Sex: [birth year] (35 y.o. F) Treating RN: Dolan Amen Primary Care Goebel Hellums: Gifford Shave Other Clinician: Referring Sharanya Templin: Gifford Shave Treating Jani Ploeger/Extender: Skipper Cliche in Treatment: 17 Clinic Level of Care Assessment Items TOOL 4 Quantity Score X - Use when only an EandM is performed on FOLLOW-UP visit 1 0 ASSESSMENTS - Nursing Assessment / Reassessment X - Reassessment of Co-morbidities (includes updates in patient status) 1 10 X- 1 5 Reassessment of Adherence to Treatment Plan ASSESSMENTS - Wound and Skin Assessment / Reassessment X - Simple Wound Assessment / Reassessment - one wound 1 5 _0  - 0 Complex Wound Assessment / Reassessment - multiple wounds _1  - 0 Dermatologic  / Skin Assessment (not related to wound area) ASSESSMENTS - Focused Assessment _2  - Circumferential Edema Measurements - multi extremities 0 _3  - 0 Nutritional Assessment / Counseling / Intervention _4  - 0 Lower Extremity Assessment (monofilament, tuning fork, pulses) _5  - 0 Peripheral Arterial Disease Assessment (using hand held doppler) ASSESSMENTS - Ostomy and/or Continence Assessment and Care _6  - Incontinence Assessment and Management 0 _7  - 0 Ostomy Care Assessment and Management (repouching, etc.) PROCESS - Coordination of Care X - Simple Patient / Family Education for ongoing care 1 15 _8  - 0 Complex (extensive) Patient / Family Education for ongoing care _9  - 0 Staff obtains Programmer, systems, Records, Test Results / Process Orders _10  - 0 Staff telephones HHA, Nursing Homes / Clarify orders / etc _11  - 0 Routine Transfer to another Facility (non-emergent condition) _12  - 0 Routine Hospital Admission (non-emergent condition) _13  - 0 New Admissions / Biomedical engineer / Ordering NPWT, Apligraf, etc. _14  - 0 Emergency Hospital Admission (emergent condition) X- 1 10 Simple Discharge Coordination _15  - 0 Complex (extensive) Discharge Coordination PROCESS - Special Needs _16  - Pediatric / Minor Patient Management 0 _17  - 0 Isolation Patient Management _18  - 0 Hearing / Language / Visual special needs _19  - 0 Assessment of Community assistance (transportation, D/C planning, etc.) _20  - 0 Additional assistance / Altered mentation _21  - 0 Support Surface(s) Assessment (bed, cushion, seat, etc.) INTERVENTIONS - Wound Cleansing / Measurement Buttrey, Chamari J. ([id number]) X- 1 5 Simple Wound Cleansing - one wound _22  - 0 Complex Wound Cleansing - multiple wounds X- 1 5 Wound Imaging (photographs - any number of wounds) _23  - 0 Wound Tracing (instead of photographs) X- 1 5 Simple Wound Measurement - one wound _24  - 0 Complex Wound Measurement - multiple wounds INTERVENTIONS -  Wound Dressings _25  - Small Wound Dressing one or multiple wounds 0 X- 1 15 Medium Wound Dressing one or multiple wounds _26  - 0 Large Wound  Dressing one or multiple wounds <WUJWJXBJYNWGNFAO>_1<\/HYQMVHQIONGEXBMW>_4  - 0 Application of Medications - topical <XLKGMWNUUVOZDGUY>_4<\/IHKVQQVZDGLOVFIE>_3  - 0 Application of Medications - injection INTERVENTIONS - Miscellaneous _2  - External ear exam 0 _3  - 0 Specimen Collection (cultures, biopsies, blood, body fluids, etc.) _4  - 0 Specimen(s) / Culture(s) sent or taken to Lab for analysis _5  - 0 Patient Transfer (multiple staff / Harrel Lemon Lift / Similar devices) _6  - 0 Simple Staple / Suture removal (25 or less) _7  - 0 Complex Staple / Suture removal (26 or more) _8  - 0 Hypo / Hyperglycemic Management (close monitor of Blood Glucose) _9  - 0 Ankle / Brachial Index (ABI) - do not check if billed separately X- 1 5 Vital Signs Has the patient been seen at the hospital within the last three years: Yes Total Score: 80 Level Of Care: New/Established - Level 3 Electronic Signature(s) Signed: 11/28/2020 4:38:42 PM By: Dolan Amen RN Entered By: Dolan Amen on 11/28/2020 14:51:48 Hornik, Herminio Heads ([id number]) -------------------------------------------------------------------------------- Encounter Discharge Information Details Patient Name: TIMBER, MARSHMAN. Date of Service: 11/28/2020 2:00 PM Medical Record Number: [id number] Patient Account Number: [id number] Date of Birth/Sex: [birth year] (35 y.o. F) Treating RN: Dolan Amen Primary Care Camaya Gannett: Gifford Shave Other Clinician: Referring Rajean Desantiago: Gifford Shave Treating Tomio Kirk/Extender: Skipper Cliche in Treatment: 17 Encounter Discharge Information Items Discharge Condition: Stable Ambulatory Status: Ambulatory Discharge Destination: Home Transportation: Private Auto Accompanied By: self Schedule Follow-up Appointment: Yes Clinical Summary of Care: Electronic Signature(s) Signed: 11/28/2020 4:38:42 PM By: Dolan Amen RN Entered By:  Dolan Amen on 11/28/2020 14:52:27 Vermette, Herminio Heads ([id number]) -------------------------------------------------------------------------------- Lower Extremity Assessment Details Patient Name: DESHUNDRA, WALLER Date of Service: 11/28/2020 2:00 PM Medical Record Number: [id number] Patient Account Number: [id number] Date of Birth/Sex: [birth year] (35 y.o. F) Treating RN: Dolan Amen Primary Care Timothy Townsel: Gifford Shave Other Clinician: Referring Anginette Espejo: Gifford Shave Treating Sheriff Rodenberg/Extender: Jeri Cos Weeks in Treatment: 17 Edema Assessment Assessed: Shirlyn Goltz: Yes] Patrice Paradise: No] Edema: [Left: CW] [Right: s] Vascular Assessment Pulses: Dorsalis Pedis Palpable: [Left:Yes] Electronic Signature(s) Signed: 11/28/2020 4:38:42 PM By: Dolan Amen RN Entered By: Dolan Amen on 11/28/2020 14:39:33 Yount, Herminio Heads ([id number]) -------------------------------------------------------------------------------- Multi Wound Chart Details Patient Name: KORDELIA, SEVERIN Date of Service: 11/28/2020 2:00 PM Medical Record Number: [id number] Patient Account Number: [id number] Date of Birth/Sex: Jul 16, 1985 (35 y.o. F) Treating RN: Dolan Amen Primary Care Darcie Mellone: Gifford Shave Other Clinician: Referring Duanne Duchesne: Gifford Shave Treating Lashya Passe/Extender: Skipper Cliche in Treatment: 17 Vital Signs Height(in): 11 Pulse(bpm): 101 Weight(lbs): 388 Blood Pressure(mmHg): 111/77 Body Mass Index(BMI): 63 Temperature(F): 98.8 Respiratory Rate(breaths/min): 18 Photos: [N/A:N/A] Wound Location: Left, Distal, Medial Lower Leg N/A N/A Wounding Event: Gradually Appeared N/A N/A Primary Etiology: Venous Leg Ulcer N/A N/A Secondary Etiology: Lymphedema N/A N/A Comorbid History: Hypertension, Peripheral Venous N/A N/A Disease Date Acquired: 07/18/2020 N/A N/A Weeks of Treatment: 17 N/A N/A Wound Status: Open N/A N/A Measurements L x W x D (cm) 0.1x0.1x0.1 N/A N/A Area  (cm) : 0.008 N/A N/A Volume (cm) : 0.001 N/A N/A % Reduction in Area: 74.20% N/A N/A % Reduction in Volume: 66.70% N/A N/A Classification: Full Thickness Without Exposed N/A N/A Support Structures Exudate Amount: Small N/A N/A Exudate Type: Serous N/A N/A Exudate Color: amber N/A N/A Wound Margin: Flat and Intact N/A N/A Granulation Amount: None Present (0%) N/A N/A Necrotic Amount: None Present (0%) N/A N/A Exposed Structures: Fat Layer (Subcutaneous Tissue): N/A N/A Yes Fascia: No Tendon: No Muscle: No Joint: No Bone: No Epithelialization: Medium (34-66%)  N/A N/A Treatment Notes Electronic Signature(s) Signed: 11/28/2020 4:38:42 PM By: Dolan Amen RN Entered By: Dolan Amen on 11/28/2020 14:43:23 Burkland, Herminio Heads ([id number]) -------------------------------------------------------------------------------- Multi-Disciplinary Care Plan Details Patient Name: ARIKA, MAINER. Date of Service: 11/28/2020 2:00 PM Medical Record Number: [id number] Patient Account Number: [id number] Date of Birth/Sex: 1986-03-03 (35 y.o. F) Treating RN: Dolan Amen Primary Care Nikkia Devoss: Gifford Shave Other Clinician: Referring Breona Cherubin: Gifford Shave Treating Tequisha Maahs/Extender: Skipper Cliche in Treatment: 17 Active Inactive Wound/Skin Impairment Nursing Diagnoses: Impaired tissue integrity Goals: Patient/caregiver will verbalize understanding of skin care regimen Date Initiated: 07/26/2020 Date Inactivated: 09/19/2020 Target Resolution Date: 07/26/2020 Goal Status: Met Ulcer/skin breakdown will have a volume reduction of 30% by week 4 Date Initiated: 07/26/2020 Date Inactivated: 09/19/2020 Target Resolution Date: 08/25/2020 Goal Status: Met Ulcer/skin breakdown will have a volume reduction of 50% by week 8 Date Initiated: 07/26/2020 Target Resolution Date: 09/25/2020 Goal Status: Active Ulcer/skin breakdown will have a volume reduction of 80% by week 12 Date Initiated:  07/26/2020 Target Resolution Date: 10/25/2020 Goal Status: Active Ulcer/skin breakdown will heal within 14 weeks Date Initiated: 07/26/2020 Target Resolution Date: 11/25/2020 Goal Status: Active Interventions: Assess patient/caregiver ability to obtain necessary supplies Assess patient/caregiver ability to perform ulcer/skin care regimen upon admission and as needed Assess ulceration(s) every visit Provide education on ulcer and skin care Treatment Activities: Referred to DME Ava Tangney for dressing supplies : 07/26/2020 Skin care regimen initiated : 07/26/2020 Notes: Electronic Signature(s) Signed: 11/28/2020 4:38:42 PM By: Dolan Amen RN Entered By: Dolan Amen on 11/28/2020 14:43:15 Lepp, Herminio Heads ([id number]) -------------------------------------------------------------------------------- Pain Assessment Details Patient Name: NASHAY, BRICKLEY Date of Service: 11/28/2020 2:00 PM Medical Record Number: [id number] Patient Account Number: [id number] Date of Birth/Sex: 10-24-1985 (35 y.o. F) Treating RN: Dolan Amen Primary Care Lizbett Garciagarcia: Gifford Shave Other Clinician: Referring Mohsin Crum: Gifford Shave Treating Makeisha Jentsch/Extender: Skipper Cliche in Treatment: 17 Active Problems Location of Pain Severity and Description of Pain Patient Has Paino No Site Locations Rate the pain. Current Pain Level: 0 Pain Management and Medication Current Pain Management: Electronic Signature(s) Signed: 11/28/2020 4:38:42 PM By: Dolan Amen RN Entered By: Dolan Amen on 11/28/2020 14:30:20 Notch, Herminio Heads ([id number]) -------------------------------------------------------------------------------- Patient/Caregiver Education Details Patient Name: KARISTA, AISPURO Date of Service: 11/28/2020 2:00 PM Medical Record Number: [id number] Patient Account Number: [id number] Date of Birth/Gender: 1985-12-15 (35 y.o. F) Treating RN: Dolan Amen Primary Care Physician: Gifford Shave Other Clinician: Referring Physician: Gifford Shave Treating Physician/Extender: Skipper Cliche in Treatment: 39 Education Assessment Education Provided To: Patient Education Topics Provided Wound/Skin Impairment: Methods: Explain/Verbal Responses: State content correctly Electronic Signature(s) Signed: 11/28/2020 4:38:42 PM By: Dolan Amen RN Entered By: Dolan Amen on 11/28/2020 14:52:00 Radman, Herminio Heads ([id number]) -------------------------------------------------------------------------------- Wound Assessment Details Patient Name: SHERONICA, COREY. Date of Service: 11/28/2020 2:00 PM Medical Record Number: [id number] Patient Account Number: [id number] Date of Birth/Sex: [birth year] (35 y.o. F) Treating RN: Dolan Amen Primary Care Rolena Knutson: Gifford Shave Other Clinician: Referring Sewell Pitner: Gifford Shave Treating Laken Lobato/Extender: Skipper Cliche in Treatment: 17 Wound Status Wound Number: 3 Primary Etiology: Venous Leg Ulcer Wound Location: Left, Distal, Medial Lower Leg Secondary Etiology: Lymphedema Wounding Event: Gradually Appeared Wound Status: Open Date Acquired: 07/18/2020 Comorbid History: Hypertension, Peripheral Venous Disease Weeks Of Treatment: 17 Clustered Wound: No Photos Wound Measurements Length: (cm) 0.1 Width: (cm) 0.1 Depth: (cm) 0.1 Area: (cm) 0.008 Volume: (cm) 0.001 % Reduction in Area: 74.2% % Reduction in Volume: 66.7% Epithelialization: Medium (34-66%) Tunneling:  No Undermining: No Wound Description Classification: Full Thickness Without Exposed Support Structu Wound Margin: Flat and Intact Exudate Amount: Small Exudate Type: Serous Exudate Color: amber res Foul Odor After Cleansing: No Slough/Fibrino Yes Wound Bed Granulation Amount: None Present (0%) Exposed Structure Necrotic Amount: None Present (0%) Fascia Exposed: No Fat Layer (Subcutaneous Tissue) Exposed: Yes Tendon Exposed: No Muscle  Exposed: No Joint Exposed: No Bone Exposed: No Treatment Notes Wound #3 (Lower Leg) Wound Laterality: Left, Medial, Distal Cleanser Soap and Water Discharge Instruction: Gently cleanse wound with antibacterial soap, rinse and pat dry prior to dressing wounds Peri-Wound Care BRISSIA, DELISA ([id number]) Topical Primary Dressing Iodosorb 40 (g) Discharge Instruction: Apply IodoSorb to wound bed only as directed. Secondary Dressing ABD Pad 5x9 (in/in) Discharge Instruction: Cover with ABD pad Secured With 57M ACE Elastic Bandage With VELCRO Brand Closure, 4 (in) Discharge Instruction: Wrap from ankle to calf-patient to adjust if ACE wrap falls Compression Wrap Compression Stockings Add-Ons Electronic Signature(s) Signed: 11/28/2020 4:38:42 PM By: Dolan Amen RN Entered By: Dolan Amen on 11/28/2020 14:38:36 Olheiser, Herminio Heads ([id number]) -------------------------------------------------------------------------------- Vitals Details Patient Name: ARADIA, ESTEY Date of Service: 11/28/2020 2:00 PM Medical Record Number: [id number] Patient Account Number: [id number] Date of Birth/Sex: 1985/05/18 (35 y.o. F) Treating RN: Dolan Amen Primary Care Neven Fina: Gifford Shave Other Clinician: Referring Keslee Harrington: Gifford Shave Treating Vue Pavon/Extender: Skipper Cliche in Treatment: 17 Vital Signs Time Taken: 14:29 Temperature (F): 98.8 Height (in): 66 Pulse (bpm): 101 Weight (lbs): 388 Respiratory Rate (breaths/min): 18 Body Mass Index (BMI): 62.6 Blood Pressure (mmHg): 111/77 Reference Range: 80 - 120 mg / dl Electronic Signature(s) Signed: 11/28/2020 4:38:42 PM By: Dolan Amen RN Entered By: Dolan Amen on 11/28/2020 14:30:05

## 2020-12-12 ENCOUNTER — Ambulatory Visit: Payer: BC Managed Care – PPO | Admitting: Physician Assistant

## 2020-12-14 ENCOUNTER — Other Ambulatory Visit: Payer: Self-pay

## 2020-12-14 ENCOUNTER — Encounter (HOSPITAL_BASED_OUTPATIENT_CLINIC_OR_DEPARTMENT_OTHER): Payer: BC Managed Care – PPO | Admitting: Internal Medicine

## 2020-12-14 DIAGNOSIS — I83893 Varicose veins of bilateral lower extremities with other complications: Secondary | ICD-10-CM | POA: Diagnosis not present

## 2020-12-14 DIAGNOSIS — I872 Venous insufficiency (chronic) (peripheral): Secondary | ICD-10-CM | POA: Diagnosis not present

## 2020-12-14 DIAGNOSIS — I890 Lymphedema, not elsewhere classified: Secondary | ICD-10-CM | POA: Diagnosis not present

## 2020-12-14 DIAGNOSIS — L98492 Non-pressure chronic ulcer of skin of other sites with fat layer exposed: Secondary | ICD-10-CM

## 2020-12-14 DIAGNOSIS — E11622 Type 2 diabetes mellitus with other skin ulcer: Secondary | ICD-10-CM | POA: Diagnosis not present

## 2020-12-14 NOTE — Progress Notes (Signed)
[name], [name] ([id number]) Visit Report for 12/14/2020 Arrival Information Details Patient Name: [name], [name] Date of Service: 12/14/2020 1:15 PM Medical Record Number: [id number] Patient Account Number: [id number] Date of Birth/Sex: 06/29/1985 (35 y.o. F) Treating RN: [name] Primary Care [name]: [name], [name] Other Clinician: Referring [name]: [name], [name] Treating [name]: [name] FrancoHoffman, Jessica [name] in Treatment: 20 Visit Information History Since Last Visit All ordered tests and consults were completed: No Patient Arrived: Ambulatory Added or deleted any medications: No Arrival Time: 13:43 Any new allergies or adverse reactions: No Accompanied By: self Had a fall or experienced change in No Transfer Assistance: None activities of daily living that may affect Patient Identification Verified: Yes risk of falls: Secondary Verification Process Completed: Yes Signs or symptoms of abuse/neglect since last visito No Patient Requires Transmission-Based Precautions: No Hospitalized since last visit: No Patient Has Alerts: No Implantable device outside of the clinic excluding No cellular tissue based products placed in the center since last visit: Has Dressing in Place as Prescribed: Yes Pain Present Now: No Electronic Signature(s) Signed: 12/14/2020 4:57:35 PM By: [name] PaxEpps, [name] RN Entered By: [name] on 12/14/2020 13:47:33 [name], [name] ([id number]) -------------------------------------------------------------------------------- Clinic Level of Care Assessment Details Patient Name: [name], [name] Date of Service: 12/14/2020 1:15 PM Medical Record Number: [id number] Patient Account Number: [id number] Date of Birth/Sex: 12/17/1985 (35 y.o. F) Treating RN: [name] Primary Care Sidney Silberman: [name], [name] Other Clinician: Referring Gianella Chismar: [name], [name] Treating Mehdi Gironda/Extender: [name] FrancoHoffman, Jessica [name] in Treatment: 20 Clinic Level of  Care Assessment Items TOOL 4 Quantity Score X - Use when only an EandM is performed on FOLLOW-UP visit 1 0 ASSESSMENTS - Nursing Assessment / Reassessment X - Reassessment of Co-morbidities (includes updates in patient status) 1 10 X- 1 5 Reassessment of Adherence to Treatment Plan ASSESSMENTS - Wound and Skin Assessment / Reassessment X - Simple Wound Assessment / Reassessment - one wound 1 5 []  - 0 Complex Wound Assessment / Reassessment - multiple wounds []  - 0 Dermatologic / Skin Assessment (not related to wound area) ASSESSMENTS - Focused Assessment []  - Circumferential Edema Measurements - multi extremities 0 []  - 0 Nutritional Assessment / Counseling / Intervention []  - 0 Lower Extremity Assessment (monofilament, tuning fork, pulses) []  - 0 Peripheral Arterial Disease Assessment (using hand held doppler) ASSESSMENTS - Ostomy and/or Continence Assessment and Care []  - Incontinence Assessment and Management 0 []  - 0 Ostomy Care Assessment and Management (repouching, etc.) PROCESS - Coordination of Care X - Simple Patient / Family Education for ongoing care 1 15 []  - 0 Complex (extensive) Patient / Family Education for ongoing care []  - 0 Staff obtains ChiropractorConsents, Records, Test Results / Process Orders []  - 0 Staff telephones HHA, Nursing Homes / Clarify orders / etc []  - 0 Routine Transfer to another Facility (non-emergent condition) []  - 0 Routine Hospital Admission (non-emergent condition) []  - 0 New Admissions / Manufacturing engineernsurance Authorizations / Ordering NPWT, Apligraf, etc. []  - 0 Emergency Hospital Admission (emergent condition) X- 1 10 Simple Discharge Coordination []  - 0 Complex (extensive) Discharge Coordination PROCESS - Special Needs []  - Pediatric / Minor Patient Management 0 []  - 0 Isolation Patient Management []  - 0 Hearing / Language / Visual special needs []  - 0 Assessment of Community assistance (transportation, D/C planning, etc.) []  - 0 Additional  assistance / Altered mentation []  - 0 Support Surface(s) Assessment (bed, cushion, seat, etc.) INTERVENTIONS - Wound Cleansing / Measurement [name], [name] ([id number]) X- 1 5 Simple Wound Cleansing -  one wound []  - 0 Complex Wound Cleansing - multiple wounds X- 1 5 Wound Imaging (photographs - any number of wounds) []  - 0 Wound Tracing (instead of photographs) X- 1 5 Simple Wound Measurement - one wound []  - 0 Complex Wound Measurement - multiple wounds INTERVENTIONS - Wound Dressings []  - Small Wound Dressing one or multiple wounds 0 []  - 0 Medium Wound Dressing one or multiple wounds []  - 0 Large Wound Dressing one or multiple wounds []  - 0 Application of Medications - topical []  - 0 Application of Medications - injection INTERVENTIONS - Miscellaneous []  - External ear exam 0 []  - 0 Specimen Collection (cultures, biopsies, blood, body fluids, etc.) []  - 0 Specimen(s) / Culture(s) sent or taken to Lab for analysis []  - 0 Patient Transfer (multiple staff / / Similar devices) []  - 0 Simple Staple / Suture removal (25 or less) []  - 0 Complex Staple / Suture removal (26 or more) []  - 0 Hypo / Hyperglycemic Management (close monitor of Blood Glucose) []  - 0 Ankle / Brachial Index (ABI) - do not check if billed separately X- 1 5 Vital Signs Has the patient been seen at the hospital within the last three years: Yes Total Score: 65 Level Of Care: New/Established - Level 2 Electronic Signature(s) Signed: 12/14/2020 4:57:35 PM By: RN Entered By: on 12/14/2020 15:33:09 [name], ( ) -------------------------------------------------------------------------------- Encounter Discharge Information Details Patient Name: [name], [name] Date of Service: 12/14/2020 1:15 PM Medical Record Number: Patient Account Number: Date of Birth/Sex: 05/10/1985 (35 y.o. F) Treating RN: Primary Care  Desteny Freeman: Other Clinician: Referring Thorvald Orsino: Treating Deangelo Berns/Extender: in Treatment: 20 Encounter Discharge Information Items Discharge Condition: Stable Ambulatory Status: Ambulatory Discharge Destination: Home Transportation: Private Auto Accompanied By: self Schedule Follow-up Appointment: Yes Clinical Summary of Care: Patient Declined Electronic Signature(s) Signed: 12/14/2020 3:35:20 PM By: [name] Pax RN Entered By: [name] Pax on 12/14/2020 15:35:19 [name], [name] ([id number]) -------------------------------------------------------------------------------- Lower Extremity Assessment Details Patient Name: [name], MANNINA. Date of Service: 12/14/2020 1:15 PM Medical Record Number: [id number] Patient Account Number: [id number] Date of Birth/Sex: [birth year] (35 y.o. F) Treating RN: [name] Pax Primary Care Jadda Hunsucker: [name] Nip Other Clinician: Referring Abagail Limb: [name] Nip Treating Gilmore List/Extender: [name] in Treatment: 20 Vascular Assessment Pulses: Dorsalis Pedis Palpable: [Left:Yes] Electronic Signature(s) Signed: 12/14/2020 4:57:35 PM By: [name] Pax RN Entered By: [name] Pax on 12/14/2020 13:48:38 [place], [name] ([id number]) -------------------------------------------------------------------------------- Multi Wound Chart Details Patient Name: [name], [name] Date of Service: 12/14/2020 1:15 PM Medical Record Number: [id number] Patient Account Number: [id number] Date of Birth/Sex: [birth year] (35 y.o. F) Treating RN: [name] Pax Primary Care Charon Smedberg: [name] Nip Other Clinician: Referring Solomia Harrell: [name] Nip Treating Lovis More/Extender: [name] in Treatment: 20 Vital Signs Height(in): 66 Pulse(bpm): 89 Weight(lbs): 388 Blood Pressure(mmHg): 109/76 Body Mass Index(BMI): 63 Temperature(F): 98.3 Respiratory Rate(breaths/min):  20 Photos: [N/A:N/A] Wound Location: Left, Distal, Medial Lower Leg N/A N/A Wounding Event: Gradually Appeared N/A N/A Primary Etiology: Venous Leg Ulcer N/A N/A Secondary Etiology: Lymphedema N/A N/A Comorbid History: Hypertension, Peripheral Venous N/A N/A Disease Date Acquired: 07/18/2020 N/A N/A [name] of Treatment: 20 N/A N/A Wound Status: Healed - Epithelialized N/A N/A Measurements L x W x D (cm) 0x0x0 N/A N/A Area (cm) : 0 N/A N/A Volume (cm) : 0 N/A N/A % Reduction in Area: 100.00% N/A N/A % Reduction in Volume: 100.00% N/A N/A Classification: Full Thickness Without  Exposed N/A N/A Support Structures Exudate Amount: None Present N/A N/A Granulation Amount: None Present (0%) N/A N/A Necrotic Amount: None Present (0%) N/A N/A Exposed Structures: Fascia: No N/A N/A Fat Layer (Subcutaneous Tissue): No Tendon: No Muscle: No Joint: No Bone: No Epithelialization: Large (67-100%) N/A N/A Treatment Notes Electronic Signature(s) Signed: 12/14/2020 4:57:35 PM By: [name] Pax RN Entered By: [name] Pax on 12/14/2020 14:19:34 Gracy, [name] ([id number]) -------------------------------------------------------------------------------- Multi-Disciplinary Care Plan Details Patient Name: [name], KEOUGH. Date of Service: 12/14/2020 1:15 PM Medical Record Number: [id number] Patient Account Number: [id number] Date of Birth/Sex: 09-May-1985 (35 y.o. F) Treating RN: [name] Pax Primary Care Burdette Forehand: [name] Nip Other Clinician: Referring Honor Fairbank: [name] Nip Treating Vola Beneke/Extender: [name] in Treatment: 20 Active Inactive Electronic Signature(s) Signed: 12/14/2020 4:57:35 PM By: [name] Pax RN Entered By: [name] Pax on 12/14/2020 14:19:23 Medel, [name] ([id number]) -------------------------------------------------------------------------------- Pain Assessment Details Patient Name: AKIRE, RENNERT. Date of Service: 12/14/2020 1:15  PM Medical Record Number: [id number] Patient Account Number: [id number] Date of Birth/Sex: [birth year] (35 y.o. F) Treating RN: [name] Pax Primary Care Idora Brosious: [name] Nip Other Clinician: Referring Jillyan Plitt: [name] Nip Treating Gabor Lusk/Extender: [name] in Treatment: 20 Active Problems Location of Pain Severity and Description of Pain Patient Has Paino No Site Locations Pain Management and Medication Current Pain Management: Electronic Signature(s) Signed: 12/14/2020 4:57:35 PM By: [name] Pax RN Entered By: [name] Pax on 12/14/2020 13:48:05 Helmuth, [name] ([id number]) -------------------------------------------------------------------------------- Patient/Caregiver Education Details Patient Name: LEILANI, CESPEDES. Date of Service: 12/14/2020 1:15 PM Medical Record Number: [id number] Patient Account Number: [id number] Date of Birth/Gender: 02/09/1986 (35 y.o. F) Treating RN: [name] Pax Primary Care Physician: [name] Nip Other Clinician: Referring Physician: Derrel Nip Treating Physician/Extender: [name] in Treatment: 20 Education Assessment Education Provided To: Patient Education Topics Provided Wound/Skin Impairment: Methods: Explain/Verbal Responses: State content correctly Electronic Signature(s) Signed: 12/14/2020 4:57:35 PM By: [name] Pax RN Entered By: [name] Pax on 12/14/2020 15:33:59 Taha, [name] ([id number]) -------------------------------------------------------------------------------- Wound Assessment Details Patient Name: HOPELYNN, GARTLAND. Date of Service: 12/14/2020 1:15 PM Medical Record Number: [id number] Patient Account Number: [id number] Date of Birth/Sex: Jun 11, 1985 (35 y.o. F) Treating RN: [name] Pax Primary Care Dimitri Dsouza: [name] Nip Other Clinician: Referring Analeigh Aries: [name] Nip Treating Lynx Goodrich/Extender: [name] in Treatment: 20 Wound Status Wound  Number: 3 Primary Etiology: Venous Leg Ulcer Wound Location: Left, Distal, Medial Lower Leg Secondary Etiology: Lymphedema Wounding Event: Gradually Appeared Wound Status: Healed - Epithelialized Date Acquired: 07/18/2020 Comorbid History: Hypertension, Peripheral Venous Disease [name] Of Treatment: 20 Clustered Wound: No Photos Wound Measurements Length: (cm) 0 Width: (cm) 0 Depth: (cm) 0 Area: (cm) 0 Volume: (cm) 0 % Reduction in Area: 100% % Reduction in Volume: 100% Epithelialization: Large (67-100%) Tunneling: No Undermining: No Wound Description Classification: Full Thickness Without Exposed Support Structure Exudate Amount: None Present s Foul Odor After Cleansing: No Slough/Fibrino No Wound Bed Granulation Amount: None Present (0%) Exposed Structure Necrotic Amount: None Present (0%) Fascia Exposed: No Fat Layer (Subcutaneous Tissue) Exposed: No Tendon Exposed: No Muscle Exposed: No Joint Exposed: No Bone Exposed: No Treatment Notes Wound #3 (Lower Leg) Wound Laterality: Left, Medial, Distal Cleanser Peri-Wound Care Topical Primary Dressing CHERISA, BRUCKER ([id number]) Secondary Dressing Secured With Compression Wrap Compression Stockings Add-Ons Electronic Signature(s) Signed: 12/14/2020 4:57:35 PM By: [name] Pax RN Entered By: [name] Pax on 12/14/2020 14:14:33 [place], [name] ([id number]) -------------------------------------------------------------------------------- Vitals Details Patient Name: [name] Simmonds Date of Service: 12/14/2020 1:15 PM Medical Record Number: [id number] Patient Account Number:  [id number] Date of Birth/Sex: 1985/10/16 (34 y.o. F) Treating RN: [name] Pax Primary Care Aubert Choyce: [name] Nip Other Clinician: Referring Saralee Bolick: [name] Nip Treating Raney Koeppen/Extender: [name] in Treatment: 20 Vital Signs Time Taken: 13:47 Temperature (F): 98.3 Height (in): 66 Pulse (bpm): 89 Weight  (lbs): 388 Respiratory Rate (breaths/min): 20 Body Mass Index (BMI): 62.6 Blood Pressure (mmHg): 109/76 Reference Range: 80 - 120 mg / dl Electronic Signature(s) Signed: 12/14/2020 4:57:35 PM By: [name] Pax RN Entered By: [name] Pax on 12/14/2020 13:47:57

## 2020-12-14 NOTE — Progress Notes (Signed)
[name] SimmondsSUMMERS, [name] ([id number]) Visit Report for 12/14/2020 Chief Complaint Document Details Patient Name: [name] SimmondsSUMMERS, [name] Date of Service: 12/14/2020 1:15 PM Medical Record Number: [id number] Patient Account Number: [id number] Date of Birth/Sex: 12/07/1985 (35 y.o. F) Treating RN: Yevonne PaxEpps, [name] Primary Care Provider: Derrel Nipresenzo, Victor Other Clinician: Referring Provider: Derrel Nipresenzo, Victor Treating Provider/Extender: Tilda FrancoHoffman, Froylan Hobby Weeks in Treatment: 20 Information Obtained from: Patient Chief Complaint Left LE Ulcer Electronic Signature(s) Signed: 12/14/2020 2:22:57 PM By: Geralyn CorwinHoffman, Breahna Boylen DO Entered By: Geralyn CorwinHoffman, Eann Cleland on 12/14/2020 14:18:19 [name], [name] ([id number]) -------------------------------------------------------------------------------- HPI Details Patient Name: [name] SimmondsSUMMERS, Nima J. Date of Service: 12/14/2020 1:15 PM Medical Record Number: [id number] Patient Account Number: [id number] Date of Birth/Sex: 12/15/1985 (35 y.o. F) Treating RN: Yevonne PaxEpps, [name] Primary Care Provider: Derrel Nipresenzo, Victor Other Clinician: Referring Provider: Derrel Nipresenzo, Victor Treating Provider/Extender: Tilda FrancoHoffman, Byanca Kasper Weeks in Treatment: 20 History of Present Illness HPI Description: 35 year old patient who started with having ulcerations on the right lower leg on the lateral part of her ankle for about 2 weeks. [facility] and advised to see the wound care for a consultation. No X-rays of workup was done during the ER visit and no prescription for any medications of compression wraps were given. the patient is not diabetic but does have hypertension and her medications have been reviewed by me. 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 The right and left greater saphenous vein was found to be tortuous. Deep  venous system was also not competent and there was reflux of more than 500 ms. She was then seen by Dr. Tawanna Coolerodd Early who recommended that the patient would not benefit from endovenous ablation and he had recommended vein stripping on the right side and multiple small phlebectomy procedures on the left side. the patient did not follow-up due to social economic reasons. She has not been wearing any compression stockings and has not taken any specific treatment for varicose veins for the last 3 years. 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 We have obtained some appointments to see the vascular surgeons by the end of the month and the patient would like to follow up with me at [facility] on Wednesday, June 29. 10/14/2014 -- [place] and hence has come for a review today. She has a vascular [facility] to [facility]. She is doing fine otherwise. 10/22/2014 -- she was seen by Dr. Gretta Beganodd Early and he has recommended surgical removal of her right saphenous vein from distal thigh to saphenofemoral junction and stab phlebectomy's of multiple large tributary branches throughout her thigh and calf. This would be done under general anesthesia in the outpatient setting. 10/29/2014 -- she is trying to work on a surgical date and in the meanwhile we have got insurance clearance for Apligraf and we will start this next week. 7/22 2016 -- she is here for the first application of Apligraf. 11/19/2014 -- she is here for a second application of Apligraf 11/26/2014 -- she has done fine after her last application of Apligraf and is awaiting her surgery which is scheduled for August 31. 12/03/2014 -- she is doing fine and is here for her third application of Apligraf.  12/21/2014 -- She had surgery on 12/15/2014 by Dr.Early who did #1 ligation and  stripping of right great saphenous vein from distal thigh to saphenofemoral junction, #2 stab phlebectomy of large tributary varicose veins in the thigh popliteal space and calf. She had an Ace wrap up to her groin and this was removed today and the Unna's boot was also removed. 12/28/2014 -- she is here for her fourth application of Apligraf. 01/06/2015 - he saw her vascular surgeon Dr. Arbie Cookey who was pleased with her progress and he has confirmed that no surgical procedures could be attempted on the left side. 01/13/2015 -- her wound looks very good and she's been having no problems whatsoever. Readmission: 07/26/2020 upon evaluation today patient presents for initial inspection here in our clinic for a new issue with her left leg although she is previously been seen due to issues with the right leg back in 2016. At that time she was seeing Dr. Arbie Cookey who is a vein/vascular specialist in [place]. He has since semiretired from what I understand. He is working out of [employer] I believe. Nonetheless she tells me at the time that there was really nothing to do for her left leg although the right leg was where they did most of the work. Subsequently she states that she is done fairly well until just in the past week where she had issues with bleeding from what appears to be varicose vein on the left leg medially. Unfortunately this has continued to be an issue although she tells me at first it was coming much more significantly Down quite a bit but nonetheless has not completely resolved. Every time she showers she notices that it starts to drain a little bit more. She does have a history of chronic venous insufficiency, lymphedema, varicose veins bilaterally, and obesity. 08/02/2020 upon evaluation today patient appears to be doing about the same in regards to the ulcer on her left leg. She has some eschar covering there is definitely some fluid collecting underneath unfortunately. With that being  said she tells me she is still having a tremendous amount of pain therefore she is really not able to allow me to clean this off very effectively to be perfectly honest. I think we need to try to soften this up 08/16/2020 upon evaluation today patient's wound is really not doing significantly better not really states about the same. She notes that the wrap just does not seem to be staying up very well at all unfortunately. No fevers, chills, nausea, vomiting, or diarrhea. She did cut it off once it starts to slide in order to alleviate some of the pressure from sliding Down. Fortunately there is no signs of active infection at this time which is great news. 08/23/2020 upon evaluation today patient appears to be doing well 08/23/2020 upon evaluation today patient appears to be doing well with regard to her wound all things considered. Fortunately there does not appear to be any signs of active infection at this time which is great news. She has been tolerating the dressing changes without complication and overall I am extremely pleased with where things stand at this point. She does have her appointment with vascular in [facility] on June 9. [name], [name] ([id number]) 08/30/2020 upon evaluation today patient actually appears to be doing decently well in regard to her wound. Fortunately there is no signs of active infection which is great news. Nonetheless I do believe that the patient is going require little bit of debridement if she is  okay with me attempting that today I think that will help clean off some of the necrotic tissue. Fortunately there does not appear to be otherwise any evidence of active infection which is also great news. 09/19/2020 upon evaluation today patient appears to be doing a little better in regard to her wound as compared to previous. Fortunately there does not appear to be any signs of active infection overall. No fever chills noted. I do believe that the Iodosorb has made  this a little bit better with regard to the overall size and appearance of the wound bed though again she does still have quite a ways to go to get this to heal she still very tender to touch. 09/27/2020 upon evaluation today patient appears to actually be doing quite well with regard to her wound. This is measuring much smaller which is great news. With that being said she did see vein and vascular in [facility] and they subsequently recommended that surgery is really what she probably needs to go forward with sounds like the potential for venous ablation. With that being said the patient tells me this is just not the right time for her to be able to proceed with any type of surgery which I completely understand. Nonetheless I do believe that she would continue to benefit from compression but again that is really not something that she is able to easily do. 10/04/2020 upon evaluation today patient appears to be doing about the same in regard to her wound. This is measuring a little bit smaller but nonetheless still is open and again has some slough and biofilm noted on the surface of the wound. There does not appear to be any signs of active infection which is great news. No fevers, chills, nausea, vomiting, or diarrhea. 10/04/2020 upon evaluation today patient appears to be doing well with regard to her wound. She has been tolerating dressing changes without complication. Fortunately there does not appear to be any signs of active infection which is great news. No fevers, chills, nausea, vomiting, or diarrhea. 10/25/2020 upon evaluation today patient appears to be doing well with regard to her wound. She has been tolerating the dressing changes without complication. Fortunately there is no signs of active infection at this time. No fevers, chills, nausea, vomiting, or diarrhea. 11/01/2020 upon evaluation today patient with regard to her wound. She has been tolerating the dressing changes without  complication. Fortunately there does not appear to be any signs of infection which is great news. No fever chills noted 11/15/2020 upon evaluation today patient appears to be doing well with regard to her wound. Fortunately there is no signs of active infection at this time. No fevers, chills, nausea, vomiting, or diarrhea. With that being said she continues to have a significant amount of pain at the site even though this is very close to complete closure. She also had several varicose veins around the area which were also problematic. Overall however I feel like the patient is making excellent progress. 11/28/2020 upon evaluation today patient appears to be doing well with regard to her leg ulcer. Again were not really able to debride or compression wrap her due to discomfort and pain. She does not allow for that. With that being said we have been using Iodosorb which does seem to be doing decently well. Fortunately there is no signs of active infection at this time which is great news. No fevers, chills, nausea, vomiting, or diarrhea. 8/31; patient presents for 2-week follow-up. She has been  using Iodosorb. She reports that the wound is closed. She denies signs of infection. Electronic Signature(s) Signed: 12/14/2020 2:22:57 PM By: Geralyn Corwin DO Entered By: Geralyn Corwin on 12/14/2020 14:19:24 Schoenherr, [name] ([id number]) -------------------------------------------------------------------------------- Physical Exam Details Patient Name: [name], [name]. Date of Service: 12/14/2020 1:15 PM Medical Record Number: [id number] Patient Account Number: [id number] Date of Birth/Sex: [birth year] (35 y.o. F) Treating RN: Yevonne Pax Primary Care Provider: Derrel Nip Other Clinician: Referring Provider: Derrel Nip Treating Provider/Extender: Tilda Franco in Treatment: 20 Constitutional . Psychiatric . Notes Left lower extremity: No open wound. Surrounding skin intact  from previous wound site. Varicose veins noted. Electronic Signature(s) Signed: 12/14/2020 2:22:57 PM By: Geralyn Corwin DO Entered By: Geralyn Corwin on 12/14/2020 14:20:15 [name], [name] ([id number]) -------------------------------------------------------------------------------- Physician Orders Details Patient Name: [name], [name] Date of Service: 12/14/2020 1:15 PM Medical Record Number: [id number] Patient Account Number: [id number] Date of Birth/Sex: [birth year] (35 y.o. F) Treating RN: Yevonne Pax Primary Care Provider: Derrel Nip Other Clinician: Referring Provider: Derrel Nip Treating Provider/Extender: Tilda Franco in Treatment: 20 Verbal / Phone Orders: No Diagnosis Coding ICD-10 Coding Code Description I87.2 Venous insufficiency (chronic) (peripheral) I89.0 Lymphedema, not elsewhere classified I83.893 Varicose veins of bilateral lower extremities with other complications L98.492 Non-pressure chronic ulcer of skin of other sites with fat layer exposed E66.01 Morbid (severe) obesity due to excess calories Discharge From [facility] Services o Discharge from Wound Care Center Treatment Complete Follow-up Appointments o Other: - per patient request return appointment next week to see Job Founds PA Electronic Signature(s) Signed: 12/14/2020 3:32:39 PM By: Yevonne Pax RN Signed: 12/14/2020 4:25:39 PM By: Geralyn Corwin DO Entered By: Yevonne Pax on 12/14/2020 15:32:39 Ivanov, [name] ([id number]) -------------------------------------------------------------------------------- Problem List Details Patient Name: [name], [name]. Date of Service: 12/14/2020 1:15 PM Medical Record Number: [id number] Patient Account Number: [id number] Date of Birth/Sex: 12-04-1985 (35 y.o. F) Treating RN: Yevonne Pax Primary Care Provider: Derrel Nip Other Clinician: Referring Provider: Derrel Nip Treating Provider/Extender: Tilda Franco in  Treatment: 20 Active Problems ICD-10 Encounter Code Description Active Date MDM Diagnosis I87.2 Venous insufficiency (chronic) (peripheral) 07/26/2020 No Yes I89.0 Lymphedema, not elsewhere classified 07/26/2020 No Yes I83.893 Varicose veins of bilateral lower extremities with other complications 07/26/2020 No Yes L98.492 Non-pressure chronic ulcer of skin of other sites with fat layer exposed 07/26/2020 No Yes E66.01 Morbid (severe) obesity due to excess calories 07/26/2020 No Yes Inactive Problems Resolved Problems Electronic Signature(s) Signed: 12/14/2020 2:22:57 PM By: Geralyn Corwin DO Entered By: Geralyn Corwin on 12/14/2020 14:18:06 [name], [name] ([id number]) -------------------------------------------------------------------------------- Progress Note Details Patient Name: [name] Date of Service: 12/14/2020 1:15 PM Medical Record Number: [id number] Patient Account Number: [id number] Date of Birth/Sex: 15-Mar-1986 (35 y.o. F) Treating RN: Yevonne Pax Primary Care Provider: Derrel Nip Other Clinician: Referring Provider: Derrel Nip Treating Provider/Extender: Tilda Franco in Treatment: 20 Subjective Chief Complaint Information obtained from Patient Left LE Ulcer History of Present Illness (HPI) 35 year old patient who started with having ulcerations on the right lower leg on the lateral part of her ankle for about 2 weeks. She was seen in the ER at [facility] and advised to see the wound care for a consultation. No X-rays of workup was done during the ER visit and no prescription for any medications of compression wraps were given. the patient is not diabetic but does have hypertension and her medications have been reviewed by me. 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  The right and left greater saphenous vein was found to be tortuous. Deep venous system was also not competent and there was reflux of more than 500 ms. She was then seen by Dr. Tawanna Cooler Early who recommended that the patient would not benefit from endovenous ablation and he had recommended vein stripping on the right side and multiple small phlebectomy procedures on the left side. the patient did not follow-up due to social economic reasons. She has not been wearing any compression stockings and has not taken any specific treatment for varicose veins for the last 3 years. 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 We have obtained some appointments to see the vascular surgeons by the end of the month and the patient would like to follow up with me at my [facility] on Wednesday, June 29. 10/14/2014 -- she could not see me yesterday in [place] and hence has come for a review today. She has a vascular workup to be done this afternoon at [facility]. She is doing fine otherwise. 10/22/2014 -- she was seen by Dr. [name] and he has recommended surgical removal of her right saphenous vein from distal thigh to saphenofemoral junction and stab phlebectomy's of multiple large tributary branches throughout her thigh and calf. This would be done under general anesthesia in the outpatient setting. 10/29/2014 -- she is trying to work on a surgical date and in the meanwhile we have got insurance clearance for Apligraf and we will start this next week. 7/22 2016 -- she is here for the first application of Apligraf. 11/19/2014 -- she is here for a second application of Apligraf 11/26/2014 -- she has done fine after her last application of Apligraf and is awaiting her surgery which is scheduled for August 31. 12/03/2014 -- she is doing fine and is here for her third application of Apligraf. 12/21/2014 --  She had surgery on 12/15/2014 by Dr.Early who did #1 ligation and stripping of right great saphenous vein from distal thigh to saphenofemoral junction, #2 stab phlebectomy of large tributary varicose veins in the thigh popliteal space and calf. She had an Ace wrap up to her groin and this was removed today and the Unna's boot was also removed. 12/28/2014 -- she is here for her fourth application of Apligraf. 01/06/2015 - he saw her vascular surgeon Dr. Arbie Cookey who was pleased with her progress and he has confirmed that no surgical procedures could be attempted on the left side. 01/13/2015 -- her wound looks very good and she's been having no problems whatsoever. Readmission: 07/26/2020 upon evaluation today patient presents for initial inspection here in our clinic for a new issue with her left leg although she is previously been seen due to issues with the right leg back in 2016. At that time she was seeing Dr. Arbie Cookey who is a vein/vascular specialist in [place]. He has since semiretired from what I understand. He is working out of [employer] I believe. Nonetheless she tells me at the time that there was really nothing to do for her left leg although the right leg was where they did most of the work. Subsequently she states that she is done fairly well until just in the past week where she had issues with bleeding from what appears to  be varicose vein on the left leg medially. Unfortunately this has continued to be an issue although she tells me at first it was coming much more significantly Down quite a bit but nonetheless has not completely resolved. Every time she showers she notices that it starts to drain a little bit more. She does have a history of chronic venous insufficiency, lymphedema, varicose veins bilaterally, and obesity. 08/02/2020 upon evaluation today patient appears to be doing about the same in regards to the ulcer on her left leg. She has some eschar covering there is definitely  some fluid collecting underneath unfortunately. With that being said she tells me she is still having a tremendous amount of pain therefore she is really not able to allow me to clean this off very effectively to be perfectly honest. I think we need to try to soften this up 08/16/2020 upon evaluation today patient's wound is really not doing significantly better not really states about the same. She notes that the wrap just does not seem to be staying up very well at all unfortunately. No fevers, chills, nausea, vomiting, or diarrhea. She did cut it off once it starts to slide in order to alleviate some of the pressure from sliding Down. Fortunately there is no signs of active infection at this time which is great news. [name], [name] ([id number]) 08/23/2020 upon evaluation today patient appears to be doing well 08/23/2020 upon evaluation today patient appears to be doing well with regard to her wound all things considered. Fortunately there does not appear to be any signs of active infection at this time which is great news. She has been tolerating the dressing changes without complication and overall I am extremely pleased with where things stand at this point. She does have her appointment with vascular in [facility] on June 9. 08/30/2020 upon evaluation today patient actually appears to be doing decently well in regard to her wound. Fortunately there is no signs of active infection which is great news. Nonetheless I do believe that the patient is going require little bit of debridement if she is okay with me attempting that today I think that will help clean off some of the necrotic tissue. Fortunately there does not appear to be otherwise any evidence of active infection which is also great news. 09/19/2020 upon evaluation today patient appears to be doing a little better in regard to her wound as compared to previous. Fortunately there does not appear to be any signs of active infection overall. No  fever chills noted. I do believe that the Iodosorb has made this a little bit better with regard to the overall size and appearance of the wound bed though again she does still have quite a ways to go to get this to heal she still very tender to touch. 09/27/2020 upon evaluation today patient appears to actually be doing quite well with regard to her wound. This is measuring much smaller which is great news. With that being said she did see vein and vascular in [facility] and they subsequently recommended that surgery is really what she probably needs to go forward with sounds like the potential for venous ablation. With that being said the patient tells me this is just not the right time for her to be able to proceed with any type of surgery which I completely understand. Nonetheless I do believe that she would continue to benefit from compression but again that is really not something that she is able to easily do. 10/04/2020 upon  evaluation today patient appears to be doing about the same in regard to her wound. This is measuring a little bit smaller but nonetheless still is open and again has some slough and biofilm noted on the surface of the wound. There does not appear to be any signs of active infection which is great news. No fevers, chills, nausea, vomiting, or diarrhea. 10/04/2020 upon evaluation today patient appears to be doing well with regard to her wound. She has been tolerating dressing changes without complication. Fortunately there does not appear to be any signs of active infection which is great news. No fevers, chills, nausea, vomiting, or diarrhea. 10/25/2020 upon evaluation today patient appears to be doing well with regard to her wound. She has been tolerating the dressing changes without complication. Fortunately there is no signs of active infection at this time. No fevers, chills, nausea, vomiting, or diarrhea. 11/01/2020 upon evaluation today patient with regard to her wound.  She has been tolerating the dressing changes without complication. Fortunately there does not appear to be any signs of infection which is great news. No fever chills noted 11/15/2020 upon evaluation today patient appears to be doing well with regard to her wound. Fortunately there is no signs of active infection at this time. No fevers, chills, nausea, vomiting, or diarrhea. With that being said she continues to have a significant amount of pain at the site even though this is very close to complete closure. She also had several varicose veins around the area which were also problematic. Overall however I feel like the patient is making excellent progress. 11/28/2020 upon evaluation today patient appears to be doing well with regard to her leg ulcer. Again were not really able to debride or compression wrap her due to discomfort and pain. She does not allow for that. With that being said we have been using Iodosorb which does seem to be doing decently well. Fortunately there is no signs of active infection at this time which is great news. No fevers, chills, nausea, vomiting, or diarrhea. 8/31; patient presents for 2-week follow-up. She has been using Iodosorb. She reports that the wound is closed. She denies signs of infection. Patient History Information obtained from Patient. Social History Never smoker, Marital Status - Single, Alcohol Use - Rarely, Drug Use - Current History - marijuana, Caffeine Use - Rarely. Medical History Cardiovascular Patient has history of Hypertension, Peripheral Venous Disease Hospitalization/Surgery History - umb hernia repair. - right lower leg vein surgery. Medical And Surgical History Notes Endocrine pre DM, A1C 5.5 12/21 Objective Constitutional Vitals Time Taken: 1:47 PM, Height: 66 in, Weight: 388 lbs, BMI: 62.6, Temperature: 98.3 F, Pulse: 89 bpm, Respiratory Rate: 20 breaths/min, Blood Pressure: 109/76 mmHg. [name], [name] ([id number]) General  Notes: Left lower extremity: No open wound. Surrounding skin intact from previous wound site. Varicose veins noted. Integumentary (Hair, Skin) Wound #3 status is Healed - Epithelialized. Original cause of wound was Gradually Appeared. The date acquired was: 07/18/2020. The wound has been in treatment 20 weeks. The wound is located on the Left,Distal,Medial Lower Leg. The wound measures 0cm length x 0cm width x 0cm depth; 0cm^2 area and 0cm^3 volume. There is no tunneling or undermining noted. There is a none present amount of drainage noted. There is no granulation within the wound bed. There is no necrotic tissue within the wound bed. 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 She did well with Iodosorb. She would like to follow-up in a week to assure nothing is opened. She states that she has compression stockings that were ordered by vein and vascular that she is going to pick up. I recommended using these daily. Plan 1. Follow-up in 1 week 2. Daily compression stockings Electronic Signature(s) Signed: 12/14/2020 2:22:57 PM By: Geralyn Corwin DO Entered By: Geralyn Corwin on 12/14/2020 14:22:13 Grim, [name] ([id number]) -------------------------------------------------------------------------------- ROS/PFSH Details Patient Name: RHIANNA, RAULERSON Date of Service: 12/14/2020 1:15 PM Medical Record Number: [id number] Patient Account Number: [id number] Date of Birth/Sex: 02-05-1986 (35 y.o. F) Treating RN: Yevonne Pax Primary Care Provider: Derrel Nip Other Clinician: Referring Provider: Derrel Nip Treating Provider/Extender: Tilda Franco in Treatment:  20 Information Obtained From Patient Cardiovascular Medical History: Positive for: Hypertension; Peripheral Venous Disease Endocrine Medical History: Past Medical History Notes: pre DM, A1C 5.5 12/21 Immunizations Pneumococcal Vaccine: Received Pneumococcal Vaccination: No Implantable Devices None Hospitalization / Surgery History Type of Hospitalization/Surgery umb hernia repair right lower leg vein surgery Family and Social History Never smoker; Marital Status - Single; Alcohol Use: Rarely; Drug Use: Current History - marijuana; Caffeine Use: Rarely; Financial Concerns: No; Food, Clothing or Shelter Needs: No; Support System Lacking: No; Transportation Concerns: No Electronic Signature(s) Signed: 12/14/2020 2:22:57 PM By: Geralyn Corwin DO Signed: 12/14/2020 4:57:35 PM By: Yevonne Pax RN Entered By: Geralyn Corwin on 12/14/2020 14:19:31 Askin, [name] ([id number]) -------------------------------------------------------------------------------- SuperBill Details Patient Name: ANNIKAH, LOVINS. Date of Service: 12/14/2020 Medical Record Number: [id number] Patient Account Number: [id number] Date of Birth/Sex: 1986/03/03 (35 y.o. F) Treating RN: Yevonne Pax Primary Care Provider: Derrel Nip Other Clinician: Referring Provider: Derrel Nip Treating Provider/Extender: Tilda Franco in Treatment: 20 Diagnosis Coding ICD-10 Codes Code Description I87.2 Venous insufficiency (chronic) (peripheral) I89.0 Lymphedema, not elsewhere classified I83.893 Varicose veins of bilateral lower extremities with other complications L98.492 Non-pressure chronic ulcer of skin of other sites with fat layer exposed E66.01 Morbid (severe) obesity due to excess calories Facility Procedures CPT4 Code: 46270350 Description: [id number] - WOUND CARE VISIT-LEV 2 EST PT Modifier: Quantity: 1 Physician Procedures CPT4 Code: 8299371 Description: 99213 - WC PHYS LEVEL 3 - EST  PT Modifier: Quantity: 1 CPT4 Code: Description: ICD-10 Diagnosis Description I87.2 Venous insufficiency (chronic) (peripheral) I89.0 Lymphedema, not elsewhere classified I83.893 Varicose veins of bilateral lower extremities with other complications L98.492 Non-pressure chronic ulcer of  skin of other sites with fat layer expos Modifier: ed Quantity: Electronic Signature(s) Signed: 12/14/2020 3:33:48 PM By: Yevonne Pax RN Signed: 12/14/2020 4:25:39 PM By: Geralyn Corwin DO Previous Signature: 12/14/2020 2:22:57 PM Version By: Geralyn Corwin DO Entered By: Yevonne Pax on 12/14/2020 15:33:47

## 2020-12-23 ENCOUNTER — Other Ambulatory Visit: Payer: Self-pay

## 2020-12-23 ENCOUNTER — Ambulatory Visit (INDEPENDENT_AMBULATORY_CARE_PROVIDER_SITE_OTHER): Payer: BC Managed Care – PPO | Admitting: Family Medicine

## 2020-12-23 ENCOUNTER — Other Ambulatory Visit (HOSPITAL_COMMUNITY)
Admission: RE | Admit: 2020-12-23 | Discharge: 2020-12-23 | Disposition: A | Discharge status: Home / Self Care | Payer: BC Managed Care – PPO | Source: Ambulatory Visit | Attending: Family Medicine | Admitting: Family Medicine

## 2020-12-23 DIAGNOSIS — Z1159 Encounter for screening for other viral diseases: Secondary | ICD-10-CM

## 2020-12-23 DIAGNOSIS — Z7251 High risk heterosexual behavior: Secondary | ICD-10-CM | POA: Diagnosis not present

## 2020-12-23 DIAGNOSIS — Z114 Encounter for screening for human immunodeficiency virus [HIV]: Secondary | ICD-10-CM

## 2020-12-23 DIAGNOSIS — Z113 Encounter for screening for infections with a predominantly sexual mode of transmission: Secondary | ICD-10-CM | POA: Diagnosis present

## 2020-12-23 LAB — POCT WET PREP (WET MOUNT)
Clue Cells Wet Prep Whiff POC: POSITIVE
Trichomonas Wet Prep HPF POC: ABSENT

## 2020-12-23 LAB — POCT URINE PREGNANCY: Preg Test, Ur: NEGATIVE

## 2020-12-23 MED ORDER — METRONIDAZOLE 500 MG PO TABS: 500.0000 mg | ORAL_TABLET | Freq: Two times a day (BID) | ORAL | 0 refills | Status: AC

## 2020-12-23 NOTE — Progress Notes (Signed)
    SUBJECTIVE:   CHIEF COMPLAINT / HPI:   STD screening Patient is here for STD screening.  She is sexually active with 2 partners and does not use protection regularly.  She reports that she has had an abnormal odor and discharge for the last few days.  At this moment she does not smell before.  She is concerned that she may have an STD.  She has not on any form of birth control at this time.  She was OCPs in the past but stopped taking them.  Does not think she is pregnant but is unsure.   OBJECTIVE:   There were no vitals taken for this visit.  General: Obese 35 year old female, no acute distress Abdomen: Soft, nontender, positive bowel sounds GU: Patient with normal external female genitalia, normal internal vaginal tissue with moderate amount of whitish discharge, cervix is friable  ASSESSMENT/PLAN:   Screening examination for STD (sexually transmitted disease) Patient presents for STD screening.  Wet prep collected, also collected gonorrhea and Chlamydia test.  Blood work is also been ordered.  Patient was positive for bacterial vaginosis and a prescription for metronidazole was sent to patient's pharmacy.  We will call patient with any other positive results and: Medications.  Strict ED and return precautions given.     Derrel Nip, MD [facility]

## 2020-12-23 NOTE — Patient Instructions (Signed)
It was good seeing you today.  We tested you for all of the STDs.  I also tested for bacterial vaginosis and yeast.  When the results come back I will either send you a message on MyChart or call you if I need to call in a medication.  I highly recommend he get on some form of birth control.  If you have any questions or concerns call the clinic.  I hope you have a wonderful day.

## 2020-12-24 LAB — RPR: RPR Ser Ql: NONREACTIVE

## 2020-12-24 LAB — HIV ANTIBODY (ROUTINE TESTING W REFLEX): HIV Screen 4th Generation wRfx: NONREACTIVE

## 2020-12-26 DIAGNOSIS — Z113 Encounter for screening for infections with a predominantly sexual mode of transmission: Secondary | ICD-10-CM | POA: Insufficient documentation

## 2020-12-26 LAB — CERVICOVAGINAL ANCILLARY ONLY
Chlamydia: NEGATIVE
Comment: NEGATIVE
Comment: NEGATIVE
Comment: NORMAL
Neisseria Gonorrhea: NEGATIVE
Trichomonas: NEGATIVE

## 2020-12-26 NOTE — Assessment & Plan Note (Signed)
Patient presents for STD screening.  Wet prep collected, also collected gonorrhea and Chlamydia test.  Blood work is also been ordered.  Patient was positive for bacterial vaginosis and a prescription for metronidazole was sent to patient's pharmacy.  We will call patient with any other positive results and: Medications.  Strict ED and return precautions given.

## 2020-12-30 ENCOUNTER — Ambulatory Visit: Payer: BC Managed Care – PPO | Admitting: Physician Assistant

## 2021-01-12 ENCOUNTER — Ambulatory Visit: Payer: BC Managed Care – PPO | Admitting: Physician Assistant

## 2021-01-18 ENCOUNTER — Ambulatory Visit: Payer: BC Managed Care – PPO | Admitting: Vascular Surgery

## 2021-02-22 ENCOUNTER — Other Ambulatory Visit: Payer: Self-pay

## 2021-02-22 MED ORDER — PANTOPRAZOLE SODIUM 20 MG PO TBEC
20.0000 mg | DELAYED_RELEASE_TABLET | Freq: Every day | ORAL | 1 refills | Status: DC
Start: 2021-02-22 — End: 2022-04-12

## 2021-03-08 ENCOUNTER — Ambulatory Visit: Payer: BC Managed Care – PPO | Admitting: Vascular Surgery

## 2021-04-05 ENCOUNTER — Ambulatory Visit: Payer: BC Managed Care – PPO | Admitting: Vascular Surgery

## 2021-04-20 ENCOUNTER — Ambulatory Visit: Payer: BC Managed Care – PPO | Admitting: Family Medicine

## 2021-04-28 ENCOUNTER — Ambulatory Visit: Payer: BC Managed Care – PPO | Admitting: Family Medicine

## 2021-05-20 ENCOUNTER — Other Ambulatory Visit: Payer: Self-pay

## 2021-05-20 ENCOUNTER — Emergency Department (HOSPITAL_COMMUNITY)
Admission: EM | Admit: 2021-05-20 | Discharge: 2021-05-20 | Disposition: A | Discharge status: Home / Self Care | Payer: BC Managed Care – PPO | Attending: Emergency Medicine | Admitting: Emergency Medicine

## 2021-05-20 ENCOUNTER — Emergency Department (HOSPITAL_COMMUNITY): Payer: BC Managed Care – PPO

## 2021-05-20 DIAGNOSIS — Z20822 Contact with and (suspected) exposure to covid-19: Secondary | ICD-10-CM | POA: Diagnosis not present

## 2021-05-20 DIAGNOSIS — J209 Acute bronchitis, unspecified: Secondary | ICD-10-CM | POA: Insufficient documentation

## 2021-05-20 DIAGNOSIS — R059 Cough, unspecified: Secondary | ICD-10-CM | POA: Diagnosis present

## 2021-05-20 LAB — RESP PANEL BY RT-PCR (FLU A&B, COVID) ARPGX2
Influenza A by PCR: NEGATIVE
Influenza B by PCR: NEGATIVE
SARS Coronavirus 2 by RT PCR: NEGATIVE

## 2021-05-20 MED ORDER — IPRATROPIUM-ALBUTEROL 0.5-2.5 (3) MG/3ML IN SOLN
3.0000 mL | Freq: Once | RESPIRATORY_TRACT | Status: AC
  Administered 2021-05-20: 3 mL via RESPIRATORY_TRACT
  Filled 2021-05-20: qty 3

## 2021-05-20 MED ORDER — ALBUTEROL SULFATE HFA 108 (90 BASE) MCG/ACT IN AERS: 1.0000 | INHALATION_SPRAY | Freq: Four times a day (QID) | RESPIRATORY_TRACT | 0 refills | Status: DC | PRN

## 2021-05-20 MED ORDER — BENZONATATE 100 MG PO CAPS: 100.0000 mg | ORAL_CAPSULE | Freq: Three times a day (TID) | ORAL | 0 refills | Status: DC

## 2021-05-20 NOTE — ED Notes (Signed)
Pt going to xray  

## 2021-05-20 NOTE — ED Provider Notes (Signed)
[facility] DEPT Provider Note   CSN: [id number] Arrival date & time: 05/20/21  1307     History  Chief Complaint  Patient presents with   Cough    Productive cough x 7 days, left big toenail hit by door at work- sore, and feels like it is coming off.     [name] is a 36 y.o. female who presents to the ED for evaluation of cough, congestion for the past 2 weeks.  She notes that her cough has been worsening she is now producing greenish-yellow sputum.  She has allergies and feels like this is exacerbating her symptoms.  Additionally, she is having some abdominal pain secondary to excessive coughing.  She has been taking DayQuil and NyQuil without any significant improvement.  She denies nausea, vomiting, diarrhea, fevers.    Cough Associated symptoms: no fever, no headaches, no rash and no shortness of breath       Home Medications Prior to Admission medications   Medication Sig Start Date End Date Taking? Authorizing Provider  albuterol (VENTOLIN HFA) 108 (90 Base) MCG/ACT inhaler Inhale 1-2 puffs into the lungs every 6 (six) hours as needed for wheezing or shortness of breath. 05/20/21  Yes Kathe Becton R, PA-C  benzonatate (TESSALON) 100 MG capsule Take 1 capsule (100 mg total) by mouth every 8 (eight) hours. 05/20/21  Yes Kathe Becton R, PA-C  albuterol (VENTOLIN HFA) 108 (90 Base) MCG/ACT inhaler Inhale 2 puffs into the lungs every 6 (six) hours as needed for wheezing or shortness of breath. 12/22/19   Fenton Foy, NP  cetirizine (ZYRTEC) 10 MG tablet Take 1 tablet (10 mg total) by mouth daily. 08/01/20   Benay Pike, MD  cyclobenzaprine (FLEXERIL) 5 MG tablet Take 1 tablet (5 mg total) by mouth 3 (three) times daily as needed for muscle spasms. 03/17/20   Benay Pike, MD  dextromethorphan-guaiFENesin [facility] DM) 30-600 MG 12hr tablet Take 1 tablet by mouth 2 (two) times daily. Patient not taking: Reported on 09/22/2020 11/20/19    Wieters, Madelynn Done C, PA-C  diclofenac (VOLTAREN) 75 MG EC tablet TAKE 1 TABLET BY MOUTH TWICE A DAY 05/09/20   Amalia Hailey, Dorathy Daft, DPM  fluticasone (FLONASE) 50 MCG/ACT nasal spray Place 2 sprays into both nostrils daily. 08/01/20   Benay Pike, MD  Multiple Vitamin (MULTIVITAMIN) tablet Take 2 tablets by mouth daily. Vitafusion gummies    [provider]  naproxen (NAPROSYN) 375 MG tablet Take 1 tablet (375 mg total) by mouth 2 (two) times daily with a meal. 04/06/20   Benay Pike, MD  pantoprazole (PROTONIX) 20 MG tablet Take 1 tablet (20 mg total) by mouth daily. 02/22/21   Gifford Shave, MD      Allergies    Tramadol    Review of Systems   Review of Systems  Constitutional:  Negative for fever.  HENT: Negative.    Eyes: Negative.   Respiratory:  Positive for cough. Negative for shortness of breath.   Cardiovascular: Negative.   Gastrointestinal:  Positive for abdominal pain. Negative for vomiting.  Endocrine: Negative.   Genitourinary: Negative.   Musculoskeletal: Negative.   Skin:  Negative for rash.  Neurological:  Negative for headaches.  All other systems reviewed and are negative.  Physical Exam Updated Vital Signs BP (!) 135/99    Pulse 96    Temp 97.8 F (36.6 C) (Oral)    Resp 18    Ht 5\' 6"  (1.676 m)  Wt (!) 172.4 kg    LMP 04/23/2021 (Approximate)    SpO2 100%    BMI 61.33 kg/m  Physical Exam Vitals and nursing note reviewed.  Constitutional:      General: She is not in acute distress.    Appearance: She is not ill-appearing.  HENT:     Head: Atraumatic.  Eyes:     Conjunctiva/sclera: Conjunctivae normal.  Cardiovascular:     Rate and Rhythm: Normal rate and regular rhythm.     Pulses: Normal pulses.     Heart sounds: No murmur heard. Pulmonary:     Effort: Pulmonary effort is normal. No respiratory distress.     Breath sounds: Normal breath sounds.  Abdominal:     General: Abdomen is flat. There is no distension.     Palpations: Abdomen is  soft.     Tenderness: There is no abdominal tenderness.  Musculoskeletal:        General: Normal range of motion.     Cervical back: Normal range of motion.  Skin:    General: Skin is warm and dry.     Capillary Refill: Capillary refill takes less than 2 seconds.  Neurological:     General: No focal deficit present.     Mental Status: She is alert.  Psychiatric:        Mood and Affect: Mood normal.    ED Results / Procedures / Treatments   Labs (all labs ordered are listed, but only abnormal results are displayed) Labs Reviewed  RESP PANEL BY RT-PCR (FLU A&B, COVID) [id number]    EKG None  Radiology DG Chest 2 View  Result Date: 05/20/2021 CLINICAL DATA:  Productive cough. EXAM: CHEST - 2 VIEW COMPARISON:  Chest radiograph dated January 02, 2020 FINDINGS: Cardiomediastinal silhouette is mildly enlarged, unchanged. Prominence of the bilateral bronchial markings concerning for bronchitis. Stable left linear subsegmental atelectasis. No focal consolidation or pleural effusion. The visualized skeletal structures are unremarkable. IMPRESSION: No focal consolidation or pleural effusion. Prominence of the bronchial markings concerning for bronchitis. No focal consolidation or pleural effusion. Stable cardiomegaly. Electronically Signed   By: Larose Hires D.O.   On: 05/20/2021 14:22    Procedures Procedures    Medications Ordered in ED Medications  ipratropium-albuterol (DUONEB) 0.5-2.5 (3) MG/3ML nebulizer solution 3 mL (3 mLs Nebulization Given 05/20/21 1526)    ED Course/ Medical Decision Making/ A&P                           Medical Decision Making Amount and/or Complexity of Data Reviewed Radiology: ordered.   History:  Per HPI This patient presents to the ED for concern of cough, this involves an extensive number of treatment options, and is a complaint that carries with it a high risk of complications and morbidity.   Differential diagnosis for emergent cause of cough  includes but is not limited to upper respiratory infection, lower respiratory infection, allergies, asthma, irritants, foreign body, medications such as ACE inhibitors, reflux, asthma, CHF, lung cancer, interstitial lung disease, psychiatric causes, postnasal drip and postinfectious bronchospasm.   Initial impression/ED course:  Patient is in no acute distress, nontoxic appearing.  Vitals are normal, SPO2 100% on room air.  Lung exam without wheezing rales or rhonchi.  Given her productive cough for 2 weeks, will obtain chest x-ray and COVID flu swab. Chest x-ray negative pneumonia.  Patient likely has bronchitis.  We discussed this in depth.  She wishes to have  breathing treatment here in ED. Will d/c home with inhaler and tessalon Pt also brings up a toe injury from one month ago, and is concerned that there is an infection. Nail is almost completed detached from nail bed. No evidence of infection or bruising. Nail will likely fall off within a few weeks.  Lab Tests and EKG:  I Ordered, reviewed, and interpreted labs and EKG.  The pertinent results in my decision-making regarding them are detailed in the ED course and/or initial impression section above.   Imaging Studies ordered:  I ordered imaging studies including x-ray which was negative for pneumonia, however notable inflammation of bronchioles. I independently visualized and interpreted imaging and I agree with the radiologist interpretation. Decisions made regarding results are detailed in the ED course and/or initial impression section above.    Medicines ordered and prescription drug management:  I ordered medication including: Duobeb  for bronchitis  Reevaluation of the patient after these medicines showed that the patient improved I have reviewed the patients home medicines and have made adjustments as needed    Disposition:  After consideration of the diagnostic results, physical exam, history and the patients response  to treatment feel that the patent would benefit from discharge.   Acute bronchitis: Duoneb given with improvement. D/c with inhaler and tessalon perles. Return precautions were discussed.  All questions asked and answered. Left great toe nail injury: Toe is noninfected, too far past initial injury for trephination or removal.  Toe will likely fall off on its own within a week or 2.  No emergent intervention needed.  Patient discharged home in good condition   Final Clinical Impression(s) / ED Diagnoses Final diagnoses:  Acute bronchitis, unspecified organism    Rx / DC Orders ED Discharge Orders          Ordered    albuterol (VENTOLIN HFA) 108 (90 Base) MCG/ACT inhaler  Every 6 hours PRN        05/20/21 1510    benzonatate (TESSALON) 100 MG capsule  Every 8 hours        05/20/21 1510              Tonye Pearson, [place] 05/20/21 1530    Valarie Merino, MD 05/22/21 1625

## 2021-05-20 NOTE — Discharge Instructions (Addendum)
Your x-ray was negative for pneumonia, however, there was some inflammation consistent with bronchitis. This is likely from how long you have had your cough and it has caused some irritation of your airways. I gave you a breathing treatment here and have sent in a prescription for an inhaler for you that you can use as needed for shortness of breath. I have also sent in a cough medication for you that usually helps with cough.  Your toenail will likely fall over over the next few weeks and will be replaced with a new one. You can trim it back if you want or wear a bandaid to prevent the nail from flapping.

## 2021-05-20 NOTE — ED Triage Notes (Signed)
Productive cough for past week, greenish yellow color, chest congestion. Also hit her left big toenail at work, it is tender and starting to come off per patient.

## 2021-05-22 ENCOUNTER — Ambulatory Visit: Payer: BC Managed Care – PPO | Admitting: Family Medicine

## 2021-06-14 ENCOUNTER — Ambulatory Visit: Payer: BC Managed Care – PPO | Admitting: Family Medicine

## 2021-07-17 ENCOUNTER — Ambulatory Visit: Payer: BC Managed Care – PPO | Admitting: Family Medicine

## 2021-07-18 ENCOUNTER — Other Ambulatory Visit: Payer: Self-pay

## 2021-07-18 ENCOUNTER — Ambulatory Visit (INDEPENDENT_AMBULATORY_CARE_PROVIDER_SITE_OTHER): Payer: BC Managed Care – PPO | Admitting: Family Medicine

## 2021-07-18 VITALS — BP 120/92 | HR 113 | Wt 383.6 lb

## 2021-07-18 DIAGNOSIS — R053 Chronic cough: Secondary | ICD-10-CM

## 2021-07-18 DIAGNOSIS — R400 Somnolence: Secondary | ICD-10-CM

## 2021-07-18 MED ORDER — FLUTICASONE PROPIONATE 50 MCG/ACT NA SUSP: 2.0000 | Freq: Every day | NASAL | 6 refills | Status: DC

## 2021-07-18 NOTE — Patient Instructions (Signed)
It was wonderful to see you today. ? ?Please bring ALL of your medications with you to every visit.  ? ?Today we talked about: ? ?Cough-with your history of runny nose cough may be due to allergic rhinitis.  I have sent in Flonase.  With your prior history of asthma as a child we can repeat your pulmonary function test.  I will call you when I have heard from Dr. Raymondo Band. ?Daytime sleepiness-I am sending you for a sleep study. ? ?Schedule physical exam with your primary care doctor as soon as possible. ? ?Please be sure to schedule follow up at the front  desk before you leave today.  ? ?If you haven't already, sign up for My Chart to have easy access to your labs results, and communication with your primary care physician. ? ?Please call the clinic at [phone] if your symptoms worsen or you have any concerns. It was our pleasure to serve you. ? ?Dr. Salvadore Dom ? ?

## 2021-07-18 NOTE — Progress Notes (Signed)
? ? ? ?  SUBJECTIVE:  ? ?CHIEF COMPLAINT / HPI:  ? ?Cough ?X1 month.  Was seen in the hospital 1 month prior for bronchitis.  Endorsing wet to dry cough.  Has tried Mucinex without relief.  Has also been using an inhaler that she was given at the hospital.  She endorses previous diagnosis of asthma as a child.  She is a known marijuana smoker.  States she also has a runny nose all the time.  Denies fever, sore throat, wheezing, shortness of breath.  She does desire to be tested for asthma.  She is not vaccinated against COVID and does not want the vaccine.  ? ?Day time sleepiness ?Concerned that she may be diabetic because she is always sleepy.  States she does work long hours.  Also states that she does not feel rested during the day after having slept the previous night.  She endorses that her significant other tells her that she snores.  She denies being told that she has had apneic spells.  She also endorses posterior headaches occasionally in the morning.  Denies changes in vision, nausea. ? ?PERTINENT  PMH / PSH: History of COVID-pneumonia and a chronic cough, BMI 61 ? ?OBJECTIVE:  ? ?BP (!) 120/92   Pulse (!) 113   Wt (!) 383 lb 9.6 oz (174 kg)   SpO2 97%   BMI 61.91 kg/m?   ?Physical Exam ?Vitals reviewed.  ?Constitutional:   ?   General: She is not in acute distress. ?   Appearance: She is not ill-appearing, toxic-appearing or diaphoretic.  ?HENT:  ?   Mouth/Throat:  ?   Pharynx: No oropharyngeal exudate or posterior oropharyngeal erythema.  ?Eyes:  ?   Conjunctiva/sclera: Conjunctivae normal.  ?Cardiovascular:  ?   Rate and Rhythm: Normal rate and regular rhythm.  ?   Heart sounds: Normal heart sounds.  ?Pulmonary:  ?   Effort: Pulmonary effort is normal.  ?   Breath sounds: Normal breath sounds.  ?Neurological:  ?   Mental Status: She is alert and oriented to person, place, and time.  ?Psychiatric:     ?   Mood and Affect: Mood normal.     ?   Behavior: Behavior normal.  ? ?ASSESSMENT/PLAN:  ? ?Chronic  cough ?X1 month but has a prior history of chronic cough.  As well as a prior history of COVID-pneumonia.  Patient also endorsing chronic rhinorrhea.  Has used Flonase in the past.  Lung exam is without wheezing, rales or rhonchi.  Childhood history of asthma.  Cough could likely be due to irritation from postnasal drainage.  Will prescribe Flonase at this time.  We will also have to refer to pulmonology for pulmonary function test due to negative vaccination status.  Can consider chest x-ray if current cough continues.  Also consider chronic cough due to mucociliary irritation from marijuana smoke.  Need to discuss cessation at follow-up. ? ?Daytime sleepiness ?Concern for diabetes due to daytime sleepiness.  Reviewed prior A1c's and within normal range.  Symptoms are more consistent with sleep apnea.  Known snorer, intermittent headaches, daytime sleepiness.  BMI of 61.  Will send for sleep study.  Consider the need for further lab work-up beyond sleep study if normal. ?- Nocturnal polysomnography (NPSG); Future ? ? ?Of note patient encouraged to schedule annual physical exam with primary care doctor soon as possible. ? ?Lavonda Jumbo, DO ?[facility]  ?

## 2021-08-18 ENCOUNTER — Ambulatory Visit: Payer: BC Managed Care – PPO | Admitting: Family Medicine

## 2021-09-19 ENCOUNTER — Encounter: Payer: Self-pay | Admitting: *Deleted

## 2021-09-25 NOTE — Progress Notes (Deleted)
    SUBJECTIVE:   CHIEF COMPLAINT / HPI:   Vaginal Discharge: Patient is a 36 y.o. female presenting with vaginal discharge for *** days.  She states the discharge is of *** consistency.  She endorses *** vaginal odor.  She is interested in screening for sexually transmitted infections today.  PERTINENT  PMH / PSH: Hx Trichomonas   OBJECTIVE:   There were no vitals taken for this visit. ***  General: NAD, pleasant, able to participate in exam Respiratory: Normal effort, no obvious respiratory distress Pelvic: VULVA: normal appearing vulva with no masses, tenderness or lesions, VAGINA: Normal appearing vagina with normal color, no lesions, with {GYN VAGINAL DISCHARGE:21986} discharge present, ***CERVIX: No lesions, {GYN VAGINAL DISCHARGE:21986} discharge present,  Chaperone *** present for pelvic exam  ASSESSMENT/PLAN:   No problem-specific Assessment & Plan notes found for this encounter.    Assessment:  36 y.o. female with vaginal discharge for***days, as well as***.  Physical exam significant for*** discharge.  Wet prep performed today shows *** consistent with ***.  Patient is interested in STI screening.   Plan: -Wet prep as above.  Will treat with***. -GC/chlamydia pending -Will check HIV, Hep B and RPR -Contraception: ***. Discussed importance of barrier protection to help protect against STI. Condoms offered.  -Pap UTD, next due 07/2024  Sabino Dick, DO [place] [facility]

## 2021-09-26 ENCOUNTER — Ambulatory Visit: Payer: BC Managed Care – PPO

## 2021-09-27 ENCOUNTER — Encounter: Payer: Self-pay | Admitting: Family Medicine

## 2021-09-27 ENCOUNTER — Ambulatory Visit (INDEPENDENT_AMBULATORY_CARE_PROVIDER_SITE_OTHER): Payer: BC Managed Care – PPO | Admitting: Family Medicine

## 2021-09-27 VITALS — BP 128/70 | HR 83 | Ht 66.0 in | Wt >= 6400 oz

## 2021-09-27 DIAGNOSIS — N898 Other specified noninflammatory disorders of vagina: Secondary | ICD-10-CM | POA: Diagnosis not present

## 2021-09-27 LAB — POCT WET PREP (WET MOUNT): Clue Cells Wet Prep Whiff POC: NEGATIVE

## 2021-09-27 MED ORDER — METRONIDAZOLE 500 MG PO TABS: 500.0000 mg | ORAL_TABLET | Freq: Two times a day (BID) | ORAL | 0 refills | Status: AC

## 2021-09-27 NOTE — Patient Instructions (Signed)
I will send you a MyChart message or call you with the results of your test.

## 2021-09-27 NOTE — Progress Notes (Signed)
    SUBJECTIVE:   CHIEF COMPLAINT / HPI:   Patient presents for vaginal discomfort, itching, odor.  She has not noticed any significant discharge but is wanting to get checked.  She notes that she was checked recently for STD screening and does not think that full screening needs to be done.  PERTINENT  PMH / PSH: Reviewed  OBJECTIVE:   BP 128/70   Pulse 83   Ht 5\' 6"  (1.676 m)   Wt (!) 403 lb 6.4 oz (183 kg)   LMP 08/13/2021   SpO2 98%   BMI 65.11 kg/m   General: NAD, well-appearing, well-nourished Respiratory: No respiratory distress, breathing comfortably, able to speak in full sentences Skin: warm and dry, no rashes noted on exposed skin Psych: Appropriate affect and mood Pelvic exam: VULVA: normal appearing vulva with no masses, tenderness or lesions, VAGINA: vaginal discharge - white and creamy, WET MOUNT done - results: trichomonads, exam chaperoned by Lavell Anchors, CMA.  ASSESSMENT/PLAN:   Vaginal discharge/odor Wet prep positive for trichomoniasis.  - Metronidazole 500mg  BID x7 days. - no intercourse until completed course of antibiotics - Recommend any recent partners also get tested.    Rise Patience, [place]

## 2021-10-17 ENCOUNTER — Emergency Department (HOSPITAL_COMMUNITY): Payer: BC Managed Care – PPO

## 2021-10-17 ENCOUNTER — Encounter (HOSPITAL_COMMUNITY): Payer: Self-pay

## 2021-10-17 ENCOUNTER — Emergency Department (HOSPITAL_COMMUNITY)
Admission: EM | Admit: 2021-10-17 | Discharge: 2021-10-17 | Disposition: A | Discharge status: Home / Self Care | Payer: BC Managed Care – PPO | Attending: Emergency Medicine | Admitting: Emergency Medicine

## 2021-10-17 DIAGNOSIS — J029 Acute pharyngitis, unspecified: Secondary | ICD-10-CM

## 2021-10-17 DIAGNOSIS — I10 Essential (primary) hypertension: Secondary | ICD-10-CM | POA: Insufficient documentation

## 2021-10-17 DIAGNOSIS — Z20822 Contact with and (suspected) exposure to covid-19: Secondary | ICD-10-CM | POA: Insufficient documentation

## 2021-10-17 DIAGNOSIS — J069 Acute upper respiratory infection, unspecified: Secondary | ICD-10-CM | POA: Diagnosis not present

## 2021-10-17 LAB — SARS CORONAVIRUS 2 BY RT PCR: SARS Coronavirus 2 by RT PCR: POSITIVE — AB

## 2021-10-17 LAB — GROUP A STREP BY PCR: Group A Strep by PCR: NOT DETECTED

## 2021-10-17 MED ORDER — DEXAMETHASONE 2 MG PO TABS
2.0000 mg | ORAL_TABLET | Freq: Once | ORAL | Status: AC
  Administered 2021-10-17: 2 mg via ORAL
  Filled 2021-10-17: qty 1

## 2021-10-17 MED ORDER — BENZONATATE 100 MG PO CAPS: 100.0000 mg | ORAL_CAPSULE | Freq: Three times a day (TID) | ORAL | 0 refills | Status: DC

## 2021-10-17 NOTE — ED Triage Notes (Signed)
Pt arrived via POV, c/o sore throat, fever, chills. No known sick contacts.

## 2021-10-17 NOTE — ED Notes (Signed)
Patient transported to X-ray 

## 2021-10-17 NOTE — Discharge Instructions (Signed)
As we discussed, your work-up in the ER today was reassuring for acute abnormalities.  Your strep swab and chest x-ray were normal.  I have given you a single dose of steroids to help with your tonsillar swelling.  Also given a prescription for Tessalon which is a cough suppressant medication.  To take as prescribed as needed.  You may also take Flonase and Zyrtec over-the-counter which are decongestant and antihistamine medications to help with upper respiratory infections.  You may also take Cepacol throat lozenges and Tylenol/ibuprofen as needed.  As URIs are almost always viral in nature, no antibiotics are indicated at this time.  Return if development of any new or worsening symptoms.

## 2021-10-17 NOTE — ED Provider Notes (Signed)
[facility]-EMERGENCY DEPT Provider Note   CSN: [id number] Arrival date & time: 10/17/21  1344     History  Chief Complaint  Patient presents with   Sore Throat    [name] is a 36 y.o. female.  Patient with history of hypertension presents today with complaints of sore throat, cough, and congestion. She states that same began initially on Sunday with subjective fevers and cough productive of yellow mucus. No known sick contacts. States that today she woke up with a sore throat and presents today with concern for same. She has not taken any medications for her symptoms. Denies any difficulty swallowing.  The history is provided by the patient. No language interpreter was used.  Sore Throat Pertinent negatives include no shortness of breath.       Home Medications Prior to Admission medications   Medication Sig Start Date End Date Taking? Authorizing Provider  albuterol (VENTOLIN HFA) 108 (90 Base) MCG/ACT inhaler Inhale 2 puffs into the lungs every 6 (six) hours as needed for wheezing or shortness of breath. 12/22/19   Ivonne Andrew, NP  albuterol (VENTOLIN HFA) 108 (90 Base) MCG/ACT inhaler Inhale 1-2 puffs into the lungs every 6 (six) hours as needed for wheezing or shortness of breath. 05/20/21   Janell Quiet, PA-C  benzonatate (TESSALON) 100 MG capsule Take 1 capsule (100 mg total) by mouth every 8 (eight) hours. 05/20/21   Janell Quiet, PA-C  cetirizine (ZYRTEC) 10 MG tablet Take 1 tablet (10 mg total) by mouth daily. 08/01/20   Sandre Kitty, MD  cyclobenzaprine (FLEXERIL) 5 MG tablet Take 1 tablet (5 mg total) by mouth 3 (three) times daily as needed for muscle spasms. 03/17/20   Sandre Kitty, MD  [facility]an[facility] DM) 30-600 MG 12hr tablet Take 1 tablet by mouth 2 (two) times daily. Patient not taking: Reported on 09/22/2020 11/20/19   Wieters, Fran Lowes C, PA-C  diclofenac (VOLTAREN) 75 MG EC tablet TAKE 1 TABLET BY MOUTH  TWICE A DAY 05/09/20   Logan Bores, Larena Glassman, DPM  fluticasone (FLONASE) 50 MCG/ACT nasal spray Place 2 sprays into both nostrils daily. 07/18/21   Autry-Lott, Randa Evens, DO  Multiple Vitamin (MULTIVITAMIN) tablet Take 2 tablets by mouth daily. Vitafusion gummies    [provider]  naproxen (NAPROSYN) 375 MG tablet Take 1 tablet (375 mg total) by mouth 2 (two) times daily with a meal. 04/06/20   Sandre Kitty, MD  pantoprazole (PROTONIX) 20 MG tablet Take 1 tablet (20 mg total) by mouth daily. 02/22/21   Celedonio Savage, MD      Allergies    Tramadol    Review of Systems   Review of Systems  HENT:  Positive for congestion and sore throat. Negative for trouble swallowing and voice change.   Respiratory:  Positive for cough. Negative for shortness of breath.   All other systems reviewed and are negative.   Physical Exam Updated Vital Signs BP 119/80 (BP Location: Left Arm)   Pulse 90   Temp 99.1 F (37.3 C) (Oral)   Resp 18   LMP 08/13/2021   SpO2 100%  Physical Exam Vitals and nursing note reviewed.  Constitutional:      General: She is not in acute distress.    Appearance: Normal appearance. She is well-developed. She is obese. She is not ill-appearing, toxic-appearing or diaphoretic.  HENT:     Head: Normocephalic and atraumatic.     Nose: No congestion or rhinorrhea.  Mouth/Throat:     Mouth: Mucous membranes are moist. No oral lesions.     Pharynx: No posterior oropharyngeal erythema or uvula swelling.     Tonsils: No tonsillar exudate or tonsillar abscesses. 2+ on the right. 2+ on the left.  Eyes:     Extraocular Movements:     Right eye: Normal extraocular motion.     Left eye: Normal extraocular motion.     Conjunctiva/sclera: Conjunctivae normal.     Pupils: Pupils are equal, round, and reactive to light.  Neck:     Comments: No meningismus Cardiovascular:     Rate and Rhythm: Normal rate and regular rhythm.     Heart sounds: Normal heart sounds.  Pulmonary:      Effort: Pulmonary effort is normal. No respiratory distress.     Breath sounds: Normal breath sounds. No wheezing.  Abdominal:     Palpations: Abdomen is soft.  Musculoskeletal:        General: Normal range of motion.     Cervical back: Normal range of motion and neck supple.  Lymphadenopathy:     Cervical: No cervical adenopathy.  Skin:    General: Skin is warm and dry.  Neurological:     General: No focal deficit present.     Mental Status: She is alert.  Psychiatric:        Mood and Affect: Mood normal.        Behavior: Behavior normal.     ED Results / Procedures / Treatments   Labs (all labs ordered are listed, but only abnormal results are displayed) Labs Reviewed  GROUP A STREP BY PCR  SARS CORONAVIRUS 2 BY RT PCR    EKG None  Radiology DG Chest 2 View  Result Date: 10/17/2021 CLINICAL DATA:  Cough EXAM: CHEST - 2 VIEW COMPARISON:  Chest x-ray 05/20/2021 FINDINGS: Cardiomegaly and mediastinum appear unchanged. Mild likely subsegmental atelectasis or scarring at the left lung base. No pleural effusion or pneumothorax. IMPRESSION: 1. Cardiomegaly. 2. Mild likely subsegmental atelectasis or scarring at the left lung base. Electronically Signed   By: Jannifer Hick M.D.   On: 10/17/2021 14:35    Procedures Procedures    Medications Ordered in ED Medications  dexamethasone (DECADRON) tablet 2 mg (2 mg Oral Given 10/17/21 1623)    ED Course/ Medical Decision Making/ A&P                           Medical Decision Making Amount and/or Complexity of Data Reviewed Radiology: ordered.  Risk Prescription drug management.   Patient presents today with URI symptoms since Sunday and sore throat that started today.  She is afebrile, nontoxic-appearing, and in no acute distress with reassuring vital signs.  Given patient's cough and congestion, x-ray imaging obtained for further evaluation.  Chest x-ray reveals no acute findings.  I have personally reviewed and  interpreted this imaging and agree with radiology interpretation.  Strep swab obtained which was negative.  Patient without history of diabetes, given Decadron to help with tonsillar swelling with improvement.  Exam not suspicious for RPA or PTA. Patients symptoms are consistent with URI, likely viral etiology. Discussed that antibiotics are not indicated for viral infections. Pt will be discharged with symptomatic treatment including Tessalon.  Upon reassessment, patient requests COVID swab for her job.  Therefore distress obtained and patient was educated on how to review these results via MyChart.  Discussed that this would not change management and  therefore she does not need to stay for these results to come back.  Patient verbalizes understanding and is agreeable with plan.  Educated on red flag symptoms that would prompt immediate return.  Pt is hemodynamically stable & in NAD prior to dc.  Discharged in stable condition.   Final Clinical Impression(s) / ED Diagnoses Final diagnoses:  Sore throat  Viral upper respiratory tract infection    Rx / DC Orders ED Discharge Orders          Ordered    benzonatate (TESSALON) 100 MG capsule  Every 8 hours        10/17/21 1653          An After Visit Summary was printed and given to the patient.     Vear Clock 10/17/21 1653    Arby Barrette, MD 10/18/21 [phone]

## 2021-10-27 ENCOUNTER — Telehealth: Payer: Self-pay

## 2021-10-27 ENCOUNTER — Other Ambulatory Visit: Payer: Self-pay

## 2021-10-27 ENCOUNTER — Encounter: Payer: BC Managed Care – PPO | Attending: Physician Assistant | Admitting: Physician Assistant

## 2021-10-27 DIAGNOSIS — Z6841 Body Mass Index (BMI) 40.0 and over, adult: Secondary | ICD-10-CM | POA: Diagnosis not present

## 2021-10-27 DIAGNOSIS — L97822 Non-pressure chronic ulcer of other part of left lower leg with fat layer exposed: Secondary | ICD-10-CM | POA: Insufficient documentation

## 2021-10-27 DIAGNOSIS — I83028 Varicose veins of left lower extremity with ulcer other part of lower leg: Secondary | ICD-10-CM | POA: Diagnosis not present

## 2021-10-27 DIAGNOSIS — I890 Lymphedema, not elsewhere classified: Secondary | ICD-10-CM | POA: Diagnosis not present

## 2021-10-27 DIAGNOSIS — I83812 Varicose veins of left lower extremities with pain: Secondary | ICD-10-CM

## 2021-10-27 NOTE — Progress Notes (Signed)
[name], [name] ([id number]) Visit Report for 10/27/2021 Abuse Risk Screen Details Patient Name: [name], [name]. Date of Service: 10/27/2021 8:45 AM Medical Record Number: [id number] Patient Account Number: [id number] Date of Birth/Sex: 1985/08/03 (36 y.o. F) Treating RN: Yevonne Pax Primary Care Marquee Fuchs: [name] Other Clinician: Referring Jaylnn Ullery: Derrel Nip Treating Tijana Walder/Extender: Rowan Blase in Treatment: 0 Abuse Risk Screen Items Answer ABUSE RISK SCREEN: Has anyone close to you tried to hurt or harm you recentlyo No Do you feel uncomfortable with anyone in your familyo No Has anyone forced you do things that you didnot want to doo No Electronic Signature(s) Signed: 10/27/2021 10:45:13 AM By: Yevonne Pax RN Entered By: Yevonne Pax on 10/27/2021 09:04:08 Mcgovern, [name] ([id number]) -------------------------------------------------------------------------------- Activities of Daily Living Details Patient Name: [name], [name]. Date of Service: 10/27/2021 8:45 AM Medical Record Number: [id number] Patient Account Number: [id number] Date of Birth/Sex: [birth year] (36 y.o. F) Treating RN: Yevonne Pax Primary Care Chriss Redel: [name] Other Clinician: Referring Jeff Frieden: Derrel Nip Treating Burgundy Matuszak/Extender: Rowan Blase in Treatment: 0 Activities of Daily Living Items Answer Activities of Daily Living (Please select one for each item) Drive Automobile Completely Able Take Medications Completely Able Use Telephone Completely Able Care for Appearance Completely Able Use Toilet Completely Able Bath / Shower Completely Able Dress Self Completely Able Feed Self Completely Able Walk Completely Able Get In / Out Bed Completely Able Housework Completely Able Prepare Meals Completely Able Handle Money Completely Able Shop for Self Completely Able Electronic Signature(s) Signed: 10/27/2021 10:45:13 AM By: Yevonne Pax RN Entered By:  Yevonne Pax on 10/27/2021 09:04:30 [name], [name] ([id number]) -------------------------------------------------------------------------------- Education Screening Details Patient Name: [name], [name] Date of Service: 10/27/2021 8:45 AM Medical Record Number: [id number] Patient Account Number: [id number] Date of Birth/Sex: 10-Jun-1985 (36 y.o. F) Treating RN: Yevonne Pax Primary Care Hermelinda Diegel: [name] Other Clinician: Referring Mica Releford: Derrel Nip Treating Delle Andrzejewski/Extender: Rowan Blase in Treatment: 0 Primary Learner Assessed: Patient Learning Preferences/Education Level/Primary Language Learning Preference: Explanation Highest Education Level: High School Preferred Language: English Cognitive Barrier Language Barrier: No Translator Needed: No Memory Deficit: No Emotional Barrier: No Cultural/Religious Beliefs Affecting Medical Care: No Physical Barrier Impaired Vision: Yes Glasses Impaired Hearing: No Decreased Hand dexterity: No Knowledge/Comprehension Knowledge Level: Medium Comprehension Level: High Ability to understand written instructions: High Ability to understand verbal instructions: High Motivation Anxiety Level: Anxious Cooperation: Cooperative Education Importance: Acknowledges Need Interest in Health Problems: Asks Questions Perception: Coherent Willingness to Engage in Self-Management High Activities: Readiness to Engage in Self-Management High Activities: Electronic Signature(s) Signed: 10/27/2021 10:45:13 AM By: Yevonne Pax RN Entered By: Yevonne Pax on 10/27/2021 09:04:59 [name], [name] ([id number]) -------------------------------------------------------------------------------- Fall Risk Assessment Details Patient Name: [name] Date of Service: 10/27/2021 8:45 AM Medical Record Number: [id number] Patient Account Number: [id number] Date of Birth/Sex: 12/08/1985 (36 y.o. F) Treating RN: Yevonne Pax Primary  Care Melitza Metheny: [name] Other Clinician: Referring Johari Bennetts: Derrel Nip Treating Irisa Grimsley/Extender: Rowan Blase in Treatment: 0 Fall Risk Assessment Items Have you had 2 or more falls in the last 12 monthso 0 No Have you had any fall that resulted in injury in the last 12 monthso 0 No FALLS RISK SCREEN History of falling - immediate or within 3 months 0 No Secondary diagnosis (Do you have 2 or more medical diagnoseso) 0 No Ambulatory aid None/bed rest/wheelchair/nurse 0 No Crutches/cane/walker 0 No Furniture 0 No Intravenous therapy Access/Saline/Heparin Lock 0 No Gait/Transferring Normal/ bed rest/ wheelchair 0 No Weak (short steps with  or without shuffle, stooped but able to lift head while walking, may 0 No seek support from furniture) Impaired (short steps with shuffle, may have difficulty arising from chair, head down, impaired 0 No balance) Mental Status Oriented to own ability 0 No Electronic Signature(s) Signed: 10/27/2021 10:45:13 AM By: Yevonne Pax RN Entered By: Yevonne Pax on 10/27/2021 09:05:06 [name], [name] ([id number]) -------------------------------------------------------------------------------- Foot Assessment Details Patient Name: [name], [name]. Date of Service: 10/27/2021 8:45 AM Medical Record Number: [id number] Patient Account Number: [id number] Date of Birth/Sex: Dec 10, 1985 (36 y.o. F) Treating RN: Yevonne Pax Primary Care Wray Goehring: [name] Other Clinician: Referring Jael Kostick: Derrel Nip Treating Raynelle Fujikawa/Extender: Rowan Blase in Treatment: 0 Foot Assessment Items Site Locations + = Sensation present, - = Sensation absent, C = Callus, U = Ulcer R = Redness, W = Warmth, M = Maceration, PU = Pre-ulcerative lesion F = Fissure, S = Swelling, D = Dryness Assessment Right: Left: Other Deformity: No No Prior Foot Ulcer: No No Prior Amputation: No No Charcot Joint: No No Ambulatory Status: Ambulatory  Without Help Gait: Steady Electronic Signature(s) Signed: 10/27/2021 10:45:13 AM By: Yevonne Pax RN Entered By: Yevonne Pax on 10/27/2021 09:13:06 [name], [name] ([id number]) -------------------------------------------------------------------------------- Nutrition Risk Screening Details Patient Name: [name], [name]. Date of Service: 10/27/2021 8:45 AM Medical Record Number: [id number] Patient Account Number: [id number] Date of Birth/Sex: 10/20/1985 (36 y.o. F) Treating RN: Yevonne Pax Primary Care Cranston Koors: [name] Other Clinician: Referring Katlynn Naser: Derrel Nip Treating Jaymison Luber/Extender: Rowan Blase in Treatment: 0 Height (in): 66 Weight (lbs): 400 Body Mass Index (BMI): 64.6 Nutrition Risk Screening Items Score Screening NUTRITION RISK SCREEN: I have an illness or condition that made me change the kind and/or amount of food I eat 0 No I eat fewer than two meals per day 0 No I eat few fruits and vegetables, or milk products 0 No I have three or more drinks of beer, liquor or wine almost every day 0 No I have tooth or mouth problems that make it hard for me to eat 0 No I don't always have enough money to buy the food I need 0 No I eat alone most of the time 0 No I take three or more different prescribed or over-the-counter drugs a day 0 No Without wanting to, I have lost or gained 10 pounds in the last six months 0 No I am not always physically able to shop, cook and/or feed myself 0 No Nutrition Protocols Good Risk Protocol 0 No interventions needed Moderate Risk Protocol High Risk Proctocol Risk Level: Good Risk Score: 0 Electronic Signature(s) Signed: 10/27/2021 10:45:13 AM By: Yevonne Pax RN Entered By: Yevonne Pax on 10/27/2021 09:05:16

## 2021-10-27 NOTE — Progress Notes (Signed)
ADYSYN, DELACUEVA ([id number]) Visit Report for 10/27/2021 Allergy List Details Patient Name: [name], [name]. Date of Service: 10/27/2021 8:45 AM Medical Record Number: [id number] Patient Account Number: [id number] Date of Birth/Sex: 10-10-1985 (36 y.o. F) Treating RN: Carlene Coria Primary Care Garron Eline: [name] Other Clinician: Referring Jerimie Mancuso: Gifford Shave Treating Kadedra Vanaken/Extender: Skipper Cliche in Treatment: 0 Allergies Active Allergies tramadol Reaction: "felt weird"/doesn't tolerated Severity: Mild Allergy Notes Electronic Signature(s) Signed: 10/27/2021 10:45:13 AM By: Carlene Coria RN Entered By: Carlene Coria on 10/27/2021 09:03:50 [place], Herminio Heads ([id number]) -------------------------------------------------------------------------------- Arrival Information Details Patient Name: [name], [name]. Date of Service: 10/27/2021 8:45 AM Medical Record Number: [id number] Patient Account Number: [id number] Date of Birth/Sex: 1986/03/02 (36 y.o. F) Treating RN: Carlene Coria Primary Care Sophiya Morello: [name] Other Clinician: Referring Dinora Hemm: Gifford Shave Treating Breta Demedeiros/Extender: Skipper Cliche in Treatment: 0 Visit Information Patient Arrived: Ambulatory Arrival Time: 08:57 Accompanied By: self Transfer Assistance: None Patient Identification Verified: Yes Secondary Verification Process Completed: Yes Patient Requires Transmission-Based Precautions: No Patient Has Alerts: No History Since Last Visit All ordered tests and consults were completed: No Added or deleted any medications: No Any new allergies or adverse reactions: No Had a fall or experienced change in activities of daily living that may affect risk of falls: No Signs or symptoms of abuse/neglect since last visito No Hospitalized since last visit: No Implantable device outside of the clinic excluding cellular tissue based products placed in the center since last visit:  No Has Dressing in Place as Prescribed: Yes Electronic Signature(s) Signed: 10/27/2021 10:45:13 AM By: Carlene Coria RN Entered By: Carlene Coria on 10/27/2021 09:02:00 [place], Herminio Heads ([id number]) -------------------------------------------------------------------------------- Clinic Level of Care Assessment Details Patient Name: [name], [name] Date of Service: 10/27/2021 8:45 AM Medical Record Number: [id number] Patient Account Number: [id number] Date of Birth/Sex: 06/20/1985 (36 y.o. F) Treating RN: Carlene Coria Primary Care Marius Betts: [name] Other Clinician: Referring Lionell Matuszak: Gifford Shave Treating Shaelynn Dragos/Extender: Skipper Cliche in Treatment: 0 Clinic Level of Care Assessment Items TOOL 2 Quantity Score X - Use when only an EandM is performed on the INITIAL visit 1 0 ASSESSMENTS - Nursing Assessment / Reassessment X - General Physical Exam (combine w/ comprehensive assessment (listed just below) when performed on new 1 20 pt. evals) X- 1 25 Comprehensive Assessment (HX, ROS, Risk Assessments, Wounds Hx, etc.) ASSESSMENTS - Wound and Skin Assessment / Reassessment X - Simple Wound Assessment / Reassessment - one wound 1 5 []  - 0 Complex Wound Assessment / Reassessment - multiple wounds []  - 0 Dermatologic / Skin Assessment (not related to wound area) ASSESSMENTS - Ostomy and/or Continence Assessment and Care []  - Incontinence Assessment and Management 0 []  - 0 Ostomy Care Assessment and Management (repouching, etc.) PROCESS - Coordination of Care X - Simple Patient / Family Education for ongoing care 1 15 []  - 0 Complex (extensive) Patient / Family Education for ongoing care X- 1 10 Staff obtains Programmer, systems, Records, Test Results / Process Orders []  - 0 Staff telephones HHA, Nursing Homes / Clarify orders / etc []  - 0 Routine Transfer to another Facility (non-emergent condition) []  - 0 Routine Hospital Admission (non-emergent condition) []  - 0 New  Admissions / Biomedical engineer / Ordering NPWT, Apligraf, etc. []  - 0 Emergency Hospital Admission (emergent condition) X- 1 10 Simple Discharge Coordination []  - 0 Complex (extensive) Discharge Coordination PROCESS - Special Needs []  - Pediatric / Minor Patient Management 0 []  - 0 Isolation Patient Management []  - 0 Hearing /  Language / Visual special needs []  - 0 Assessment of Community assistance (transportation, D/C planning, etc.) []  - 0 Additional assistance / Altered mentation []  - 0 Support Surface(s) Assessment (bed, cushion, seat, etc.) INTERVENTIONS - Wound Cleansing / Measurement X - Wound Imaging (photographs - any number of wounds) 1 5 []  - 0 Wound Tracing (instead of photographs) X- 1 5 Simple Wound Measurement - one wound []  - 0 Complex Wound Measurement - multiple wounds [name], [name]. ( ) X- 1 5 Simple Wound Cleansing - one wound []  - 0 Complex Wound Cleansing - multiple wounds INTERVENTIONS - Wound Dressings X - Small Wound Dressing one or multiple wounds 1 10 []  - 0 Medium Wound Dressing one or multiple wounds []  - 0 Large Wound Dressing one or multiple wounds []  - 0 Application of Medications - injection INTERVENTIONS - Miscellaneous []  - External ear exam 0 []  - 0 Specimen Collection (cultures, biopsies, blood, body fluids, etc.) []  - 0 Specimen(s) / Culture(s) sent or taken to Lab for analysis []  - 0 Patient Transfer (multiple staff / / Similar devices) []  - 0 Simple Staple / Suture removal (25 or less) []  - 0 Complex Staple / Suture removal (26 or more) []  - 0 Hypo / Hyperglycemic Management (close monitor of Blood Glucose) X- 1 15 Ankle / Brachial Index (ABI) - do not check if billed separately Has the patient been seen at the hospital within the last three years: Yes Total Score: 125 Level Of Care: New/Established - Level 4 Electronic Signature(s) Signed: 10/27/2021 10:45:13 AM By:  RN Entered By: [name] on 10/27/2021 09:43:17 Dicenzo, ( ) -------------------------------------------------------------------------------- Encounter Discharge Information Details Patient Name: [name], [name]. Date of Service: 10/27/2021 8:45 AM Medical Record Number: Patient Account Number: Date of Birth/Sex: 1985-06-28 (36 y.o. F) Treating RN: Nurse, adult Primary Care Chenee Munns: Other Clinician: Referring Tressa Maldonado: Treating Jeannette Maddy/Extender: in Treatment: 0 Encounter Discharge Information Items Discharge Condition: Stable Ambulatory Status: Ambulatory Discharge Destination: Home Transportation: Private Auto Accompanied By: self Schedule Follow-up Appointment: Yes Clinical Summary of Care: Electronic Signature(s) Signed: 10/27/2021 10:45:13 AM By: Yevonne Pax RN Entered By: Yevonne Pax on 10/27/2021 09:43:56 [place], [name] ([id number]) -------------------------------------------------------------------------------- Lower Extremity Assessment Details Patient Name: [name], [name]. Date of Service: 10/27/2021 8:45 AM Medical Record Number: [id number] Patient Account Number: [id number] Date of Birth/Sex: 1985-06-22 (36 y.o. F) Treating RN: Yevonne Pax Primary Care Sampson Self: [name] Other Clinician: Referring Charma Mocarski: Derrel Nip Treating Vilda Zollner/Extender: Rowan Blase in Treatment: 0 Edema Assessment Assessed: [Left: No] [Right: No] Edema: [Left: Ye] [Right: s] Calf Left: Right: Point of Measurement: 30 cm From Medial Instep 62 cm Ankle Left: Right: Point of Measurement: 11 cm From Medial Instep 34 cm Knee To Floor Left: Right: From Medial Instep 43 cm Vascular Assessment Pulses: Dorsalis Pedis Palpable: [Left:Yes] Notes non compressable Electronic Signature(s) Signed: 10/27/2021 10:45:13 AM By: Yevonne Pax RN Entered By: Yevonne Pax on  10/27/2021 09:22:52 [place], [name] ([id number]) -------------------------------------------------------------------------------- Multi Wound Chart Details Patient Name: [name], [name] Date of Service: 10/27/2021 8:45 AM Medical Record Number: [id number] Patient Account Number: [id number] Date of Birth/Sex: [birth year] (36 y.o. F) Treating RN: Yevonne Pax Primary Care Roschelle Calandra: [name] Other Clinician: Referring Letta Cargile: Derrel Nip Treating Elliotte Marsalis/Extender: Rowan Blase in Treatment: 0 Vital Signs Height(in): 66 Pulse(bpm): 103 Weight(lbs): 400 Blood Pressure(mmHg): 125/81 Body Mass Index(BMI): 64.6 Temperature(F): 98.3 Respiratory Rate(breaths/min): 16 Photos: [N/A:N/A] Wound Location: Left, Medial  Lower Leg N/A N/A Wounding Event: Gradually Appeared N/A N/A Primary Etiology: Venous Leg Ulcer N/A N/A Comorbid History: Hypertension, Peripheral Venous N/A N/A Disease Date Acquired: 10/17/2021 N/A N/A Weeks of Treatment: 0 N/A N/A Wound Status: Open N/A N/A Wound Recurrence: No N/A N/A Measurements L x W x D (cm) 0.7x1x0.1 N/A N/A Area (cm) : 0.55 N/A N/A Volume (cm) : 0.055 N/A N/A Classification: Full Thickness Without Exposed N/A N/A Support Structures Exudate Amount: Medium N/A N/A Exudate Type: Serosanguineous N/A N/A Exudate Color: red, brown N/A N/A Granulation Amount: Medium (34-66%) N/A N/A Granulation Quality: Red N/A N/A Necrotic Amount: Medium (34-66%) N/A N/A Exposed Structures: Fat Layer (Subcutaneous Tissue): N/A N/A Yes Fascia: No Tendon: No Muscle: No Joint: No Bone: No Epithelialization: None N/A N/A Treatment Notes Electronic Signature(s) Signed: 10/27/2021 10:45:13 AM By: Carlene Coria RN Entered By: Carlene Coria on 10/27/2021 [place], Herminio Heads ([id number]) -------------------------------------------------------------------------------- [place] Details Patient Name: [name], [name]. Date  of Service: 10/27/2021 8:45 AM Medical Record Number: [id number] Patient Account Number: [id number] Date of Birth/Sex: 1985/12/19 (36 y.o. F) Treating RN: Carlene Coria Primary Care Lilyona Richner: [name] Other Clinician: Referring Ruchel Brandenburger: Gifford Shave Treating Maeci Kalbfleisch/Extender: Skipper Cliche in Treatment: 0 Active Inactive Wound/Skin Impairment Nursing Diagnoses: Knowledge deficit related to ulceration/compromised skin integrity Goals: Patient/caregiver will verbalize understanding of skin care regimen Date Initiated: 10/27/2021 Target Resolution Date: 11/27/2021 Goal Status: Active Ulcer/skin breakdown will have a volume reduction of 30% by week 4 Date Initiated: 10/27/2021 Target Resolution Date: 12/28/2021 Goal Status: Active Ulcer/skin breakdown will have a volume reduction of 50% by week 8 Date Initiated: 10/27/2021 Target Resolution Date: 01/27/2022 Goal Status: Active Ulcer/skin breakdown will have a volume reduction of 80% by week 12 Date Initiated: 10/27/2021 Target Resolution Date: 02/27/2022 Goal Status: Active Ulcer/skin breakdown will heal within 14 weeks Date Initiated: 10/27/2021 Target Resolution Date: 03/29/2022 Goal Status: Active Interventions: Assess patient/caregiver ability to obtain necessary supplies Assess patient/caregiver ability to perform ulcer/skin care regimen upon admission and as needed Assess ulceration(s) every visit Notes: Electronic Signature(s) Signed: 10/27/2021 10:45:13 AM By: Carlene Coria RN Entered By: Carlene Coria on 10/27/2021 09:40:50 [place], Herminio Heads ([id number]) -------------------------------------------------------------------------------- Pain Assessment Details Patient Name: [name], [name]. Date of Service: 10/27/2021 8:45 AM Medical Record Number: [id number] Patient Account Number: [id number] Date of Birth/Sex: Aug 29, 1985 (36 y.o. F) Treating RN: Carlene Coria Primary Care Chong Wojdyla: [name] Other  Clinician: Referring Deyjah Kindel: Gifford Shave Treating [name] [name]/Extender: Skipper Cliche in Treatment: 0 Active Problems Location of Pain Severity and Description of Pain Patient Has Paino Yes Site Locations With Dressing Change: Yes Duration of the Pain. Constant / Intermittento Constant Rate the pain. Current Pain Level: 9 Worst Pain Level: 10 Least Pain Level: 2 Character of Pain Describe the Pain: Burning Pain Management and Medication Current Pain Management: Medication: Yes Cold Application: No Rest: Yes Massage: No Activity: No T.E.N.S.: No Heat Application: No Leg drop or elevation: No Is the Current Pain Management Adequate: Inadequate How does your wound impact your activities of daily livingo Sleep: Yes Bathing: No Appetite: No Relationship With Others: No Bladder Continence: No Emotions: No Bowel Continence: No Work: No Toileting: No Drive: No Dressing: No Hobbies: No Electronic Signature(s) Signed: 10/27/2021 10:45:13 AM By: Carlene Coria RN Entered By: Carlene Coria on 10/27/2021 09:02:58 [place], Herminio Heads ([id number]) -------------------------------------------------------------------------------- Patient/Caregiver Education Details Patient Name: [name], [name] Date of Service: 10/27/2021 8:45 AM Medical Record Number: [id number] Patient Account Number: [id number] Date of Birth/Gender: 07-21-1985 (36 y.o.  F) Treating RN: Yevonne Pax Primary Care Physician: [name] Other Clinician: Referring Physician: Derrel Nip Treating Physician/Extender: Rowan Blase in Treatment: 0 Education Assessment Education Provided To: Patient Education Topics Provided Electronic Signature(s) Signed: 10/27/2021 10:45:13 AM By: Yevonne Pax RN Entered By: Yevonne Pax on 10/27/2021 09:43:26 [place], [name] ([id number]) -------------------------------------------------------------------------------- Wound Assessment Details Patient Name:  [name], [name]. Date of Service: 10/27/2021 8:45 AM Medical Record Number: [id number] Patient Account Number: [id number] Date of Birth/Sex: [birth year] (36 y.o. F) Treating RN: Yevonne Pax Primary Care Nikka Hakimian: [name] Other Clinician: Referring Voyd Groft: Derrel Nip Treating Montario Zilka/Extender: Rowan Blase in Treatment: 0 Wound Status Wound Number: 4 Primary Etiology: Venous Leg Ulcer Wound Location: Left, Medial Lower Leg Wound Status: Open Wounding Event: Gradually Appeared Comorbid History: Hypertension, Peripheral Venous Disease Date Acquired: 10/17/2021 Weeks Of Treatment: 0 Clustered Wound: No Photos Wound Measurements Length: (cm) 0.7 Width: (cm) 1 Depth: (cm) 0.1 Area: (cm) 0.55 Volume: (cm) 0.055 % Reduction in Area: % Reduction in Volume: Epithelialization: None Tunneling: No Undermining: No Wound Description Classification: Full Thickness Without Exposed Support Structu Exudate Amount: Medium Exudate Type: Serosanguineous Exudate Color: red, brown res Foul Odor After Cleansing: No Slough/Fibrino No Wound Bed Granulation Amount: Medium (34-66%) Exposed Structure Granulation Quality: Red Fascia Exposed: No Necrotic Amount: Medium (34-66%) Fat Layer (Subcutaneous Tissue) Exposed: Yes Necrotic Quality: Adherent Slough Tendon Exposed: No Muscle Exposed: No Joint Exposed: No Bone Exposed: No Treatment Notes Wound #4 (Lower Leg) Wound Laterality: Left, Medial Cleanser Peri-Wound Care Topical Primary Dressing [name], MAC ([id number]) IODOFLEX 0.9% Cadexomer Iodine Pad Discharge Instruction: Apply Iodoflex to wound bed only as directed. Secondary Dressing (SILICONE BORDER) Zetuvit Plus SILICONE BORDER Dressing 4x4 (in/in) Discharge Instruction: Please do not put silicone bordered dressings under wraps. Use non-bordered dressing only. Secured With Compression Wrap tubi grip Discharge Instruction: size F double  layer Compression Stockings Add-Ons Electronic Signature(s) Signed: 10/27/2021 10:45:13 AM By: Yevonne Pax RN Entered By: Yevonne Pax on 10/27/2021 09:17:37 [place], [name] ([id number]) -------------------------------------------------------------------------------- Vitals Details Patient Name: SHANTI, AGRESTI Date of Service: 10/27/2021 8:45 AM Medical Record Number: [id number] Patient Account Number: [id number] Date of Birth/Sex: 1985-09-19 (36 y.o. F) Treating RN: Yevonne Pax Primary Care Shankar Silber: [name] Other Clinician: Referring Jontae Sonier: Derrel Nip Treating Dellamae Rosamilia/Extender: Rowan Blase in Treatment: 0 Vital Signs Time Taken: 09:03 Temperature (F): 98.3 Height (in): 66 Pulse (bpm): 103 Source: Stated Respiratory Rate (breaths/min): 16 Weight (lbs): 400 Blood Pressure (mmHg): 125/81 Source: Stated Reference Range: 80 - 120 mg / dl Body Mass Index (BMI): 64.6 Electronic Signature(s) Signed: 10/27/2021 10:45:13 AM By: Yevonne Pax RN Entered By: Yevonne Pax on 10/27/2021 09:03:34

## 2021-10-27 NOTE — Telephone Encounter (Signed)
Pt called stating that she has a wound that has "opened up on her leg" and needs an appt.  Reviewed pt's chart, returned pt's call for clarification, two identifiers used. Pt stated that on her L leg, below her calf, she had a non-draining wound. She had started back at the wound care center today and they advised that she make an appt in this office to be evaluated. She denies wearing compression stockings and wants to be re-fitted.  Called pt back with appt date and times. Confirmed understanding.

## 2021-10-30 ENCOUNTER — Ambulatory Visit (INDEPENDENT_AMBULATORY_CARE_PROVIDER_SITE_OTHER): Payer: BC Managed Care – PPO | Admitting: Surgery

## 2021-10-30 ENCOUNTER — Encounter: Payer: Self-pay | Admitting: Surgery

## 2021-10-30 ENCOUNTER — Ambulatory Visit (HOSPITAL_COMMUNITY)
Admission: RE | Admit: 2021-10-30 | Discharge: 2021-10-30 | Disposition: A | Discharge status: Home / Self Care | Payer: BC Managed Care – PPO | Source: Ambulatory Visit | Attending: Surgery | Admitting: Surgery

## 2021-10-30 VITALS — BP 119/83 | HR 86 | Temp 98.0°F | Resp 20 | Ht 66.0 in | Wt 399.4 lb

## 2021-10-30 DIAGNOSIS — I83023 Varicose veins of left lower extremity with ulcer of ankle: Secondary | ICD-10-CM | POA: Diagnosis not present

## 2021-10-30 DIAGNOSIS — L97329 Non-pressure chronic ulcer of left ankle with unspecified severity: Secondary | ICD-10-CM

## 2021-10-30 DIAGNOSIS — I83812 Varicose veins of left lower extremities with pain: Secondary | ICD-10-CM | POA: Insufficient documentation

## 2021-10-30 NOTE — Progress Notes (Signed)
[facility]  Patient name: [name] MRN: [id number] DOB: 1986/02/19 Sex: female   REASON FOR VISIT:    Follow up  HISOTRY OF PRESENT ILLNESS:    [name] is a 36 y.o. female who is well-known to our practice for treatment of venous stasis ulcers.  She has undergone right leg vein stripping by Dr. Arbie Cookey in 2016.  She was last seen by Dr. Darrick Penna in 2022 for a left ankle ulcer.  She was offered vein stripping of the left leg but never followed up.  She now has a new ulcer on her left ankle and is being treated at the wound center.   PAST MEDICAL HISTORY:   Past Medical History:  Diagnosis Date   Ankle ulcer (HCC)    right   Childhood asthma    Chronic lower back pain    GERD (gastroesophageal reflux disease)    Headache    "only when I'm hungry" (10/14/2015)   Hypertension    Obesity    Varicose veins    VSD (ventricular septal defect)    "had hole in bottom of her heart; closed up on it's own; no OR" /mom 10/14/2015)     FAMILY HISTORY:   Family History  Problem Relation Age of Onset   Diabetes Mother    Varicose Veins Mother    Hypertension Mother    Hyperlipidemia Mother    Healthy Father    Varicose Veins Sister    Cancer Sister        unsure of type but requiring hysterectomy    SOCIAL HISTORY:   Social History   Tobacco Use   Smoking status: Never   Smokeless tobacco: Never  Substance Use Topics   Alcohol use: Yes    Comment:  socially     ALLERGIES:   Allergies  Allergen Reactions   Tramadol Other (See Comments)    Did not make her feel like herself     CURRENT MEDICATIONS:   Current Outpatient Medications  Medication Sig Dispense Refill   albuterol (VENTOLIN HFA) 108 (90 Base) MCG/ACT inhaler Inhale 2 puffs into the lungs every 6 (six) hours as needed for wheezing or shortness of breath. 8 g 2   albuterol (VENTOLIN HFA) 108 (90 Base) MCG/ACT inhaler Inhale  1-2 puffs into the lungs every 6 (six) hours as needed for wheezing or shortness of breath. 1 each 0   benzonatate (TESSALON) 100 MG capsule Take 1 capsule (100 mg total) by mouth every 8 (eight) hours. 21 capsule 0   cetirizine (ZYRTEC) 10 MG tablet Take 1 tablet (10 mg total) by mouth daily. 30 tablet 11   cyclobenzaprine (FLEXERIL) 5 MG tablet Take 1 tablet (5 mg total) by mouth 3 (three) times daily as needed for muscle spasms. 30 tablet 0   dextromethorphan-guaiFENesin (MUCINEX DM) 30-600 MG 12hr tablet Take 1 tablet by mouth 2 (two) times daily. (Patient not taking: Reported on 09/22/2020) 20 tablet 0   diclofenac (VOLTAREN) 75 MG EC tablet TAKE 1 TABLET BY MOUTH TWICE A DAY 90 tablet 1   fluticasone (FLONASE) 50 MCG/ACT nasal spray Place 2 sprays into both nostrils daily. 16 g 6   Multiple Vitamin (MULTIVITAMIN) tablet Take 2 tablets by mouth daily. Vitafusion gummies     naproxen (NAPROSYN) 375 MG tablet Take 1 tablet (375 mg total) by mouth 2 (two) times daily with a meal. 20 tablet 0   pantoprazole (PROTONIX) 20 MG tablet Take 1 tablet (  20 mg total) by mouth daily. 90 tablet 1   No current facility-administered medications for this visit.    REVIEW OF SYSTEMS:   [X]  denotes positive finding, [ ]  denotes negative finding Cardiac  Comments:  Chest pain or chest pressure:    Shortness of breath upon exertion:    Short of breath when lying flat:    Irregular heart rhythm:        Vascular    Pain in calf, thigh, or hip brought on by ambulation:    Pain in feet at night that wakes you up from your sleep:     Blood clot in your veins:    Leg swelling:         Pulmonary    Oxygen at home:    Productive cough:     Wheezing:         Neurologic    Sudden weakness in arms or legs:     Sudden numbness in arms or legs:     Sudden onset of difficulty speaking or slurred speech:    Temporary loss of vision in one eye:     Problems with dizziness:         Gastrointestinal    Blood in  stool:     Vomited blood:         Genitourinary    Burning when urinating:     Blood in urine:        Psychiatric    Major depression:         Hematologic    Bleeding problems:    Problems with blood clotting too easily:        Skin    Rashes or ulcers:        Constitutional    Fever or chills:      PHYSICAL EXAM:   There were no vitals filed for this visit.  GENERAL: The patient is a well-nourished female, in no acute distress. The vital signs are documented above. CARDIAC: There is a regular rate and rhythm.  VASCULAR: SonoSite was used to evaluate the saphenous vein which is of appropriate diameter and straight.  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 PULMONARY: Non-labored respirations ABDOMEN: Soft and non-tender with normal pitched bowel sounds.  MUSCULOSKELETAL: There are no major deformities or cyanosis. NEUROLOGIC: No focal weakness or paresthesias are detected. SKIN: There are no ulcers or rashes noted. PSYCHIATRIC: The patient has a normal affect.  STUDIES:   I have reviewed the following venous reflux studies:    +--------------+---------+------+-----------+------------+--------+  LEFT          Reflux NoRefluxReflux TimeDiameter cmsComments                          Yes                                   +--------------+---------+------+-----------+------------+--------+  CFV           no                                              +--------------+---------+------+-----------+------------+--------+  FV prox       no                                              +--------------+---------+------+-----------+------------+--------+  FV mid        no                                              +--------------+---------+------+-----------+------------+--------+  FV dist       no                                               +--------------+---------+------+-----------+------------+--------+  Popliteal               yes   >1 second                       +--------------+---------+------+-----------+------------+--------+  GSV at SFJ              yes    >500 ms      1.17              +--------------+---------+------+-----------+------------+--------+  GSV prox thigh          yes    >500 ms     0.949              +--------------+---------+------+-----------+------------+--------+  GSV mid thigh           yes    >500 ms      1.38              +--------------+---------+------+-----------+------------+--------+  GSV dist thigh          yes    >500 ms      1.36              +--------------+---------+------+-----------+------------+--------+  GSV at knee             yes    >500 ms     0.829              +--------------+---------+------+-----------+------------+--------+  GSV prox calf                              0.819              +--------------+---------+------+-----------+------------+--------+  GSV mid calf                               0.905              +--------------+---------+------+-----------+------------+--------+  SSV Pop Fossa           yes    >500 ms     0.798              +--------------+---------+------+-----------+------------+--------+  SSV prox calf           yes    >500 ms     0.700              +--------------+---------+------+-----------+------------+--------+  SSV mid calf                               0.688              +--------------+---------+------+-----------+------------+--------+   MEDICAL ISSUES:   CEAP class VI: The patient is having recurrent venous ulcers on the  left leg.  I think that she needs to undergo intervention.  She had previously undergone vein stripping on the right.  When I evaluate her saphenous vein on the left, I think that she would be a candidate for laser ablation as well as greater than  20 stabs to treat her varicosities.  She is a little concerned about doing this in the office.  I told her we would try and if she struggles we can reschedule it in the operating room for vein stripping but I think she would be better served with laser ablation.  I think she would be at high risk for wound complications given her morbid obesity.  We had a long discussion regarding weight loss surgery and dieting to help her become more healthy.  She is going to initiate this today.  Addendum: I was notified that the patient exceeds the weight limit for our table in the office.  Therefore, she will need to be scheduled for ligation and stripping of her left saphenous vein as well as stab phlebectomy.  This will need to be done in the operating room  Durene Cal, IV, MD, FACS [facility] [phone] Pager [phone]

## 2021-10-31 NOTE — Progress Notes (Signed)
SHARIECE, VIVEIROS ([id number]) Visit Report for 10/27/2021 Chief Complaint Document Details Patient Name: [name], [name]. Date of Service: 10/27/2021 8:45 AM Medical Record Number: [id number] Patient Account Number: [id number] Date of Birth/Sex: [birth year] (36 y.o. F) Treating RN: Carlene Coria Primary Care Provider: [name] Other Clinician: Referring Provider: Gifford Shave Treating Provider/Extender: Skipper Cliche in Treatment: 0 Information Obtained from: Patient Chief Complaint Left LE Ulcer Electronic Signature(s) Signed: 10/27/2021 9:33:06 AM By: Worthy Keeler PA-C Entered By: Worthy Keeler on 10/27/2021 09:33:05 Haverstick, [name] ([id number]) -------------------------------------------------------------------------------- Debridement Details Patient Name: [name], [name] Date of Service: 10/27/2021 8:45 AM Medical Record Number: [id number] Patient Account Number: [id number] Date of Birth/Sex: 1986-03-09 (36 y.o. F) Treating RN: Carlene Coria Primary Care Provider: [name] Other Clinician: Referring Provider: Gifford Shave Treating Provider/Extender: Skipper Cliche in Treatment: 0 Debridement Performed for Wound #4 Left,Medial Lower Leg Assessment: Performed By: Physician Tommie Sams., PA-C Debridement Type: Chemical/Enzymatic/Mechanical Agent Used: saline and gauze Severity of Tissue Pre Debridement: Fat layer exposed Level of Consciousness (Pre- Awake and Alert procedure): Pre-procedure Verification/Time Out Yes - 09:40 Taken: Start Time: 09:40 Instrument: Other : saline gauze Bleeding: None End Time: 09:44 Procedural Pain: 6 Post Procedural Pain: 2 Response to Treatment: Procedure was tolerated well Level of Consciousness (Post- Awake and Alert procedure): Post Debridement Measurements of Total Wound Length: (cm) 0.7 Width: (cm) 1 Depth: (cm) 0.1 Volume: (cm) 0.055 Character of Wound/Ulcer Post Debridement:  Improved Severity of Tissue Post Debridement: Fat layer exposed Post Procedure Diagnosis Same as Pre-procedure Electronic Signature(s) Signed: 10/27/2021 10:45:13 AM By: Carlene Coria RN Signed: 10/27/2021 4:36:28 PM By: Worthy Keeler PA-C Entered By: Carlene Coria on 10/27/2021 09:45:15 Tribbett, [name] ([id number]) -------------------------------------------------------------------------------- HPI Details Patient Name: [name], [name] Date of Service: 10/27/2021 8:45 AM Medical Record Number: [id number] Patient Account Number: [id number] Date of Birth/Sex: 01/08/1986 (36 y.o. F) Treating RN: Carlene Coria Primary Care Provider: [name] Other Clinician: Referring Provider: Gifford Shave Treating Provider/Extender: Skipper Cliche in Treatment: 0 History of Present Illness HPI Description: 36 year old patient who started with having ulcerations on the right lower leg on the lateral part of her ankle for about 2 weeks. She was seen in the ER at [facility] and advised to see the wound care for a consultation. No X-rays of workup was done during the ER visit and no prescription for any medications of compression wraps were given. the patient is not diabetic but does have hypertension and her medications have been reviewed by me. 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 The right and left greater saphenous vein was found to be tortuous. Deep venous system was also not competent and there was reflux of more than 500 ms. She was then seen by Dr. Sherren Mocha Early who recommended that the patient would not benefit from endovenous ablation and he had recommended vein stripping on the right side and multiple small phlebectomy procedures on the left side. the patient did not follow-up due to social economic reasons. She has  not been wearing any compression stockings and has not taken any specific treatment for varicose veins for the last 3 years. 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 We have obtained some  appointments to see the vascular surgeons by the end of the month and the patient would like to follow up with me at my [facility] on Wednesday, June 29. 10/14/2014 -- she could not see me yesterday in [place] and hence has come for a review today. She has a vascular workup to be done this afternoon at [facility] She is doing fine otherwise. 10/22/2014 -- she was seen by Dr. Curt Jews and he has recommended surgical removal of her right saphenous vein from distal thigh to saphenofemoral junction and stab phlebectomy's of multiple large tributary branches throughout her thigh and calf. This would be done under general anesthesia in the outpatient setting. 10/29/2014 -- she is trying to work on a surgical date and in the meanwhile we have got insurance clearance for Apligraf and we will start this next week. 7/22 2016 -- she is here for the first application of Apligraf. 11/19/2014 -- she is here for a second application of Apligraf 11/26/2014 -- she has done fine after her last application of Apligraf and is awaiting her surgery which is scheduled for August 31. 12/03/2014 -- she is doing fine and is here for her third application of Apligraf. 12/21/2014 -- She had surgery on 12/15/2014 by Dr.Early who did #1 ligation and stripping of right great saphenous vein from distal thigh to saphenofemoral junction, #2 stab phlebectomy of large tributary varicose veins in the thigh popliteal space and calf. She had an Ace wrap up to her groin and this was removed today and the Unna's boot was also removed. 12/28/2014 -- she is here for her fourth application of Apligraf. 01/06/2015 - he saw her vascular  surgeon Dr. Donnetta Hutching who was pleased with her progress and he has confirmed that no surgical procedures could be attempted on the left side. 01/13/2015 -- her wound looks very good and she's been having no problems whatsoever. Readmission: 07/26/2020 upon evaluation today patient presents for initial inspection here in our clinic for a new issue with her left leg although she is previously been seen due to issues with the right leg back in 2016. At that time she was seeing Dr. Donnetta Hutching who is a vein/vascular specialist in [place]. He has since semiretired from what I understand. He is working out of [employer] I believe. Nonetheless she tells me at the time that there was really nothing to do for her left leg although the right leg was where they did most of the work. Subsequently she states that she is done fairly well until just in the past week where she had issues with bleeding from what appears to be varicose vein on the left leg medially. Unfortunately this has continued to be an issue although she tells me at first it was coming much more significantly Down quite a bit but nonetheless has not completely resolved. Every time she showers she notices that it starts to drain a little bit more. She does have a history of chronic venous insufficiency, lymphedema, varicose veins bilaterally, and obesity. 08/02/2020 upon evaluation today patient appears to be doing about the same in regards to the ulcer on her left leg. She has some eschar covering there is definitely some fluid collecting underneath unfortunately. With that being said she tells me she is still having a tremendous amount of pain therefore she is really not able to allow me to clean this off very effectively to be perfectly honest. I think we need to try to soften this up 08/16/2020 upon evaluation today patient's wound  is really not doing significantly better not really states about the same. She notes that the wrap just does not seem to be  staying up very well at all unfortunately. No fevers, chills, nausea, vomiting, or diarrhea. She did cut it off once it starts to slide in order to alleviate some of the pressure from sliding Down. Fortunately there is no signs of active infection at this time which is great news. 08/23/2020 upon evaluation today patient appears to be doing well 08/23/2020 upon evaluation today patient appears to be doing well with regard to her wound all things considered. Fortunately there does not appear to be any signs of active infection at this time which is great news. She has been tolerating the dressing changes without complication and overall I am extremely pleased with where things stand at this point. She does have her appointment with vascular in [facility] on June 9. BETTYANNE, DITTMAN ([id number]) 08/30/2020 upon evaluation today patient actually appears to be doing decently well in regard to her wound. Fortunately there is no signs of active infection which is great news. Nonetheless I do believe that the patient is going require little bit of debridement if she is okay with me attempting that today I think that will help clean off some of the necrotic tissue. Fortunately there does not appear to be otherwise any evidence of active infection which is also great news. 09/19/2020 upon evaluation today patient appears to be doing a little better in regard to her wound as compared to previous. Fortunately there does not appear to be any signs of active infection overall. No fever chills noted. I do believe that the Iodosorb has made this a little bit better with regard to the overall size and appearance of the wound bed though again she does still have quite a ways to go to get this to heal she still very tender to touch. 09/27/2020 upon evaluation today patient appears to actually be doing quite well with regard to her wound. This is measuring much smaller which is great news. With that being said she did see  vein and vascular in [facility] and they subsequently recommended that surgery is really what she probably needs to go forward with sounds like the potential for venous ablation. With that being said the patient tells me this is just not the right time for her to be able to proceed with any type of surgery which I completely understand. Nonetheless I do believe that she would continue to benefit from compression but again that is really not something that she is able to easily do. 10/04/2020 upon evaluation today patient appears to be doing about the same in regard to her wound. This is measuring a little bit smaller but nonetheless still is open and again has some slough and biofilm noted on the surface of the wound. There does not appear to be any signs of active infection which is great news. No fevers, chills, nausea, vomiting, or diarrhea. 10/04/2020 upon evaluation today patient appears to be doing well with regard to her wound. She has been tolerating dressing changes without complication. Fortunately there does not appear to be any signs of active infection which is great news. No fevers, chills, nausea, vomiting, or diarrhea. 10/25/2020 upon evaluation today patient appears to be doing well with regard to her wound. She has been tolerating the dressing changes without complication. Fortunately there is no signs of active infection at this time. No fevers, chills, nausea, vomiting, or diarrhea. 11/01/2020 upon  evaluation today patient with regard to her wound. She has been tolerating the dressing changes without complication. Fortunately there does not appear to be any signs of infection which is great news. No fever chills noted 11/15/2020 upon evaluation today patient appears to be doing well with regard to her wound. Fortunately there is no signs of active infection at this time. No fevers, chills, nausea, vomiting, or diarrhea. With that being said she continues to have a significant amount of  pain at the site even though this is very close to complete closure. She also had several varicose veins around the area which were also problematic. Overall however I feel like the patient is making excellent progress. 11/28/2020 upon evaluation today patient appears to be doing well with regard to her leg ulcer. Again were not really able to debride or compression wrap her due to discomfort and pain. She does not allow for that. With that being said we have been using Iodosorb which does seem to be doing decently well. Fortunately there is no signs of active infection at this time which is great news. No fevers, chills, nausea, vomiting, or diarrhea. 8/31; patient presents for 2-week follow-up. She has been using Iodosorb. She reports that the wound is closed. She denies signs of infection. Readmission: 10-27-2021 upon evaluation this is a patient that presents today whom I have previously seen this is pretty much for the same issue though I think a little bit higher than the last time I saw her. She does have a history of chronic venous insufficiency and hypertension along with varicose veins. Subsequently she does have an ulceration which spontaneously ruptured she has not been wearing any compression which I think is a big part of the issue here. We discussed this before I really think she probably needs to be wearing her compression therapy, she probably needs lymphedema pumps if she can ever wear the compression for a significant amount of time to get these, and subsequently also think that she needs to be elevating her legs is much as possible she may even need some vascular intervention in regard to her veins. All of this was reiterated and discussed with her today to reinforce what needs to happen in order to ensure that her legs do not get a lot worse. The patient voiced understanding. She tells me that she knows because she is seeing her mom go through a lot of this as well how bad things  can get. Electronic Signature(s) Signed: 10/27/2021 4:21:23 PM By: Worthy Keeler PA-C Entered By: Worthy Keeler on 10/27/2021 16:21:23 [name] Ensign ([id number]) -------------------------------------------------------------------------------- Physical Exam Details Patient Name: [name], [name]. Date of Service: 10/27/2021 8:45 AM Medical Record Number: [id number] Patient Account Number: [id number] Date of Birth/Sex: 1985-05-28 (36 y.o. F) Treating RN: Carlene Coria Primary Care Provider: [name] Other Clinician: Referring Provider: Gifford Shave Treating Provider/Extender: Skipper Cliche in Treatment: 0 Constitutional sitting or standing blood pressure is within target range for patient.. pulse regular and within target range for patient.Marland Kitchen respirations regular, non- labored and within target range for patient.Marland Kitchen temperature within target range for patient.. Obese and well-hydrated in no acute distress. Eyes conjunctiva clear no eyelid edema noted. pupils equal round and reactive to light and accommodation. Ears, Nose, Mouth, and Throat no gross abnormality of ear auricles or external auditory canals. normal hearing noted during conversation. mucus membranes moist. Respiratory normal breathing without difficulty. Cardiovascular 2+ dorsalis pedis/posterior tibialis pulses. Patient has stage III nonpitting lymphedema. Musculoskeletal normal  gait and posture. no significant deformity or arthritic changes, no loss or range of motion, no clubbing. Psychiatric this patient is able to make decisions and demonstrates good insight into disease process. Alert and Oriented x 3. pleasant and cooperative. Notes Upon inspection patient's wound did have some need for sharp debridement but she is having such significant pain that she would not allow me to even do this today. For that reason we can use the Iodoflex which previously did well for her in order to see if we can get this  cleaned up. The patient voiced understanding. Electronic Signature(s) Signed: 10/27/2021 4:22:35 PM By: Worthy Keeler PA-C Entered By: Worthy Keeler on 10/27/2021 16:22:35 Sawyers, [name] ([id number]) -------------------------------------------------------------------------------- Physician Orders Details Patient Name: [name], [name]. Date of Service: 10/27/2021 8:45 AM Medical Record Number: [id number] Patient Account Number: [id number] Date of Birth/Sex: 10/16/1985 (36 y.o. F) Treating RN: Carlene Coria Primary Care Provider: [name] Other Clinician: Referring Provider: Gifford Shave Treating Provider/Extender: Skipper Cliche in Treatment: 0 Verbal / Phone Orders: No Diagnosis Coding ICD-10 Coding Code Description I83.028 Varicose veins of left lower extremity with ulcer other part of lower leg I89.0 Lymphedema, not elsewhere classified L97.822 Non-pressure chronic ulcer of other part of left lower leg with fat layer exposed E66.01 Morbid (severe) obesity due to excess calories Follow-up Appointments o Return Appointment in 1 week. Bathing/ Shower/ Hygiene o May shower; gently cleanse wound with antibacterial soap, rinse and pat dry prior to dressing wounds Edema Control - Lymphedema / Segmental Compressive Device / Other o Tubigrip double layer applied - size F o Elevate, Exercise Daily and Avoid Standing for Long Periods of Time. o Elevate legs to the level of the heart and pump ankles as often as possible o Elevate leg(s) parallel to the floor when sitting. Wound Treatment Wound #4 - Lower Leg Wound Laterality: Left, Medial Cleanser: Byram Ancillary Kit - 15 Day Supply (DME) (Generic) 3 x Per Week/30 Days Discharge Instructions: Use supplies as instructed; Kit contains: (15) Saline Bullets; (15) 3x3 Gauze; 15 pr Gloves Cleanser: Soap and Water 3 x Per Week/30 Days Discharge Instructions: Gently cleanse wound with antibacterial soap, rinse and  pat dry prior to dressing wounds Primary Dressing: IODOFLEX 0.9% Cadexomer Iodine Pad 3 x Per Week/30 Days Discharge Instructions: Apply Iodoflex to wound bed only as directed. Secondary Dressing: (SILICONE BORDER) Zetuvit Plus SILICONE BORDER Dressing 4x4 (in/in) (DME) (Generic) 3 x Per Week/30 Days Discharge Instructions: Please do not put silicone bordered dressings under wraps. Use non-bordered dressing only. Compression Wrap: tubi grip 3 x Per Week/30 Days Discharge Instructions: size F double layer Electronic Signature(s) Signed: 10/27/2021 4:36:28 PM By: Worthy Keeler PA-C Signed: 10/31/2021 8:33:55 AM By: Carlene Coria RN Previous Signature: 10/27/2021 10:45:13 AM Version By: Carlene Coria RN Entered By: Carlene Coria on 10/27/2021 11:49:10 Brocksmith, [name] ([id number]) -------------------------------------------------------------------------------- Problem List Details Patient Name: [name], [name]. Date of Service: 10/27/2021 8:45 AM Medical Record Number: [id number] Patient Account Number: [id number] Date of Birth/Sex: 1986-01-08 (36 y.o. 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 layer 10/27/2021 No Yes exposed E66.01 Morbid (severe)  obesity due to excess calories 10/27/2021 No Yes Inactive Problems Resolved Problems Electronic Signature(s) Signed: 10/27/2021 9:32:36 AM By: Worthy Keeler PA-C Entered By: Worthy Keeler on 10/27/2021 09:32:36 Lahti, [name]  ([id number]) -------------------------------------------------------------------------------- Progress Note Details Patient Name: [name], [name] Date of Service: 10/27/2021 8:45 AM Medical Record Number: [id number] Patient Account Number: [id number] Date of Birth/Sex: [birth year] (36 y.o. F) Treating RN: Carlene Coria Primary Care Provider: [name] Other Clinician: Referring Provider: Gifford Shave Treating Provider/Extender: Skipper Cliche in Treatment: 0 Subjective Chief Complaint Information obtained from Patient Left LE Ulcer History of Present Illness (HPI) 36 year old patient who started with having ulcerations on the right lower leg on the lateral part of her ankle for about 2 weeks. She was seen in the ER at [facility] and advised to see the wound care for a consultation. No X-rays of workup was done during the ER visit and no prescription for any medications of compression wraps were given. the patient is not diabetic but does have hypertension and her medications have been reviewed by me. 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 The right and left greater saphenous vein was found to be tortuous. Deep venous system was also not competent and there was reflux of more than 500 ms. She was then seen by Dr. Sherren Mocha Early who recommended that the patient would not benefit from endovenous ablation and he had recommended vein stripping on the right side and multiple small phlebectomy procedures on the left side. the patient did not follow-up due to social economic reasons. She has not been wearing any compression stockings and has not taken any specific treatment for varicose veins for the last 3 years. 09/27/2014 -- She has developed a new wound on the medial malleolus which is rather superficial and in the  area where she has stasis dermatitis. We have obtained some appointments to see the vascular surgeons by the end of the month and the patient would like to follow up with me at my [facility] on Wednesday, June 29. 10/14/2014 -- she could not see me yesterday in [place] and hence has come for a review today. She has a vascular workup to be done this afternoon at [facility]. She is doing fine otherwise. 10/22/2014 -- she was seen by Dr. Curt Jews and he has recommended surgical removal of her right saphenous vein from distal thigh to saphenofemoral junction and stab phlebectomy's of multiple large tributary branches throughout her thigh and calf. This would be done under general anesthesia in the outpatient setting. 10/29/2014 -- she is trying to work on a surgical date and in the meanwhile we have got insurance clearance for Apligraf and we will start this next week. 7/22 2016 -- she is here for the first application of Apligraf. 11/19/2014 -- she is here for a second application of Apligraf 11/26/2014 -- she has done fine after her last application of Apligraf and is awaiting her surgery which is scheduled for August 31. 12/03/2014 -- she is doing fine and is here for her third application of Apligraf. 12/21/2014 -- She had surgery on 12/15/2014 by Dr.Early who did #1 ligation and stripping of right great saphenous vein from distal thigh to saphenofemoral junction, #2 stab phlebectomy of large tributary varicose veins in the thigh popliteal space and calf. She had an Ace  wrap up to her groin and this was removed today and the Unna's boot was also removed. 12/28/2014 -- she is here for her fourth application of Apligraf. 01/06/2015 - he saw her vascular surgeon Dr. Donnetta Hutching who was pleased with her progress and he has confirmed that no surgical procedures could be attempted on the left side. 01/13/2015 -- her wound looks very good and she's been having no problems  whatsoever. Readmission: 07/26/2020 upon evaluation today patient presents for initial inspection here in our clinic for a new issue with her left leg although she is previously been seen due to issues with the right leg back in 2016. At that time she was seeing Dr. Donnetta Hutching who is a vein/vascular specialist in [employer]. He has since semiretired from what I understand. He is working out of [employer] I believe. Nonetheless she tells me at the time that there was really nothing to do for her left leg although the right leg was where they did most of the work. Subsequently she states that she is done fairly well until just in the past week where she had issues with bleeding from what appears to be varicose vein on the left leg medially. Unfortunately this has continued to be an issue although she tells me at first it was coming much more significantly Down quite a bit but nonetheless has not completely resolved. Every time she showers she notices that it starts to drain a little bit more. She does have a history of chronic venous insufficiency, lymphedema, varicose veins bilaterally, and obesity. 08/02/2020 upon evaluation today patient appears to be doing about the same in regards to the ulcer on her left leg. She has some eschar covering there is definitely some fluid collecting underneath unfortunately. With that being said she tells me she is still having a tremendous amount of pain therefore she is really not able to allow me to clean this off very effectively to be perfectly honest. I think we need to try to soften this up 08/16/2020 upon evaluation today patient's wound is really not doing significantly better not really states about the same. She notes that the wrap just does not seem to be staying up very well at all unfortunately. No fevers, chills, nausea, vomiting, or diarrhea. She did cut it off once it starts to slide in order to alleviate some of the pressure from sliding Down. Fortunately  there is no signs of active infection at this time which is great news. [name], [name] ([id number]) 08/23/2020 upon evaluation today patient appears to be doing well 08/23/2020 upon evaluation today patient appears to be doing well with regard to her wound all things considered. Fortunately there does not appear to be any signs of active infection at this time which is great news. She has been tolerating the dressing changes without complication and overall I am extremely pleased with where things stand at this point. She does have her appointment with vascular in [facility] on June 9. 08/30/2020 upon evaluation today patient actually appears to be doing decently well in regard to her wound. Fortunately there is no signs of active infection which is great news. Nonetheless I do believe that the patient is going require little bit of debridement if she is okay with me attempting that today I think that will help clean off some of the necrotic tissue. Fortunately there does not appear to be otherwise any evidence of active infection which is also great news. 09/19/2020 upon evaluation today patient appears to be doing  a little better in regard to her wound as compared to previous. Fortunately there does not appear to be any signs of active infection overall. No fever chills noted. I do believe that the Iodosorb has made this a little bit better with regard to the overall size and appearance of the wound bed though again she does still have quite a ways to go to get this to heal she still very tender to touch. 09/27/2020 upon evaluation today patient appears to actually be doing quite well with regard to her wound. This is measuring much smaller which is great news. With that being said she did see vein and vascular in [facility] and they subsequently recommended that surgery is really what she probably needs to go forward with sounds like the potential for venous ablation. With that being said the patient  tells me this is just not the right time for her to be able to proceed with any type of surgery which I completely understand. Nonetheless I do believe that she would continue to benefit from compression but again that is really not something that she is able to easily do. 10/04/2020 upon evaluation today patient appears to be doing about the same in regard to her wound. This is measuring a little bit smaller but nonetheless still is open and again has some slough and biofilm noted on the surface of the wound. There does not appear to be any signs of active infection which is great news. No fevers, chills, nausea, vomiting, or diarrhea. 10/04/2020 upon evaluation today patient appears to be doing well with regard to her wound. She has been tolerating dressing changes without complication. Fortunately there does not appear to be any signs of active infection which is great news. No fevers, chills, nausea, vomiting, or diarrhea. 10/25/2020 upon evaluation today patient appears to be doing well with regard to her wound. She has been tolerating the dressing changes without complication. Fortunately there is no signs of active infection at this time. No fevers, chills, nausea, vomiting, or diarrhea. 11/01/2020 upon evaluation today patient with regard to her wound. She has been tolerating the dressing changes without complication. Fortunately there does not appear to be any signs of infection which is great news. No fever chills noted 11/15/2020 upon evaluation today patient appears to be doing well with regard to her wound. Fortunately there is no signs of active infection at this time. No fevers, chills, nausea, vomiting, or diarrhea. With that being said she continues to have a significant amount of pain at the site even though this is very close to complete closure. She also had several varicose veins around the area which were also problematic. Overall however I feel like the patient is making excellent  progress. 11/28/2020 upon evaluation today patient appears to be doing well with regard to her leg ulcer. Again were not really able to debride or compression wrap her due to discomfort and pain. She does not allow for that. With that being said we have been using Iodosorb which does seem to be doing decently well. Fortunately there is no signs of active infection at this time which is great news. No fevers, chills, nausea, vomiting, or diarrhea. 8/31; patient presents for 2-week follow-up. She has been using Iodosorb. She reports that the wound is closed. She denies signs of infection. Readmission: 10-27-2021 upon evaluation this is a patient that presents today whom I have previously seen this is pretty much for the same issue though I think a little bit higher  than the last time I saw her. She does have a history of chronic venous insufficiency and hypertension along with varicose veins. Subsequently she does have an ulceration which spontaneously ruptured she has not been wearing any compression which I think is a big part of the issue here. We discussed this before I really think she probably needs to be wearing her compression therapy, she probably needs lymphedema pumps if she can ever wear the compression for a significant amount of time to get these, and subsequently also think that she needs to be elevating her legs is much as possible she may even need some vascular intervention in regard to her veins. All of this was reiterated and discussed with her today to reinforce what needs to happen in order to ensure that her legs do not get a lot worse. The patient voiced understanding. She tells me that she knows because she is seeing her mom go through a lot of this as well how bad things can get. Patient History Information obtained from Patient. Allergies tramadol (Severity: Mild, Reaction: "felt weird"/doesn't tolerated) Social History Never smoker, Marital Status - Single, Alcohol Use -  Rarely, Drug Use - Current History - marijuana, Caffeine Use - Rarely. Medical History Cardiovascular Patient has history of Hypertension, Peripheral Venous Disease Hospitalization/Surgery History - umb hernia repair. - right lower leg vein surgery. Medical And Surgical History Notes Endocrine pre DM, A1C 5.5 12/21 Tocco, Aibhlinn J. ([id number]) Objective Constitutional sitting or standing blood pressure is within target range for patient.. pulse regular and within target range for patient.Marland Kitchen respirations regular, non- labored and within target range for patient.Marland Kitchen temperature within target range for patient.. Obese and well-hydrated in no acute distress. Vitals Time Taken: 9:03 AM, Height: 66 in, Source: Stated, Weight: 400 lbs, Source: Stated, BMI: 64.6, Temperature: 98.3 F, Pulse: 103 bpm, Respiratory Rate: 16 breaths/min, Blood Pressure: 125/81 mmHg. Eyes conjunctiva clear no eyelid edema noted. pupils equal round and reactive to light and accommodation. Ears, Nose, Mouth, and Throat no gross abnormality of ear auricles or external auditory canals. normal hearing noted during conversation. mucus membranes moist. Respiratory normal breathing without difficulty. Cardiovascular 2+ dorsalis pedis/posterior tibialis pulses. Patient has stage III nonpitting lymphedema. Musculoskeletal normal gait and posture. no significant deformity or arthritic changes, no loss or range of motion, no clubbing. Psychiatric this patient is able to make decisions and demonstrates good insight into disease process. Alert and Oriented x 3. pleasant and cooperative. General Notes: Upon inspection patient's wound did have some need for sharp debridement but she is having such significant pain that she would not allow me to even do this today. For that reason we can use the Iodoflex which previously did well for her in order to see if we can get this cleaned up. The patient voiced understanding. Integumentary  (Hair, Skin) Wound #4 status is Open. Original cause of wound was Gradually Appeared. The date acquired was: 10/17/2021. The wound is located on the Left,Medial Lower Leg. The wound measures 0.7cm length x 1cm width x 0.1cm depth; 0.55cm^2 area and 0.055cm^3 volume. There is Fat Layer (Subcutaneous Tissue) exposed. There is no tunneling or undermining noted. There is a medium amount of serosanguineous drainage noted. There is medium (34-66%) red granulation within the wound bed. There is a medium (34-66%) amount of necrotic tissue within the wound bed including Adherent Slough. Assessment Active Problems ICD-10 Varicose veins of left lower extremity with ulcer other part of lower leg Lymphedema, not elsewhere classified Non-pressure chronic ulcer  of other part of left lower leg with fat layer exposed Morbid (severe) obesity due to excess calories Procedures Wound #4 Pre-procedure diagnosis of Wound #4 is a Venous Leg Ulcer located on the Left,Medial Lower Leg .Severity of Tissue Pre Debridement is: Fat layer exposed. There was a Chemical/Enzymatic/Mechanical debridement performed by Tommie Sams., PA-C. With the following instrument(s): saline gauze. Other agent used was saline and gauze. A time out was conducted at 09:40, prior to the start of the procedure. There was no bleeding. The procedure was tolerated well with a pain level of 6 throughout and a pain level of 2 following the procedure. Post Debridement Measurements: 0.7cm length x 1cm width x 0.1cm depth; 0.055cm^3 volume. Character of Wound/Ulcer Post Debridement is improved. Severity of Tissue Post Debridement is: Fat layer exposed. Post procedure Diagnosis Wound #4: Same as Pre-Procedure [name], [name] ([id number]) Plan Follow-up Appointments: Return Appointment in 1 week. Bathing/ Shower/ Hygiene: May shower; gently cleanse wound with antibacterial soap, rinse and pat dry prior to dressing wounds Edema Control - Lymphedema /  Segmental Compressive Device / Other: Tubigrip double layer applied - size F Elevate, Exercise Daily and Avoid Standing for Long Periods of Time. Elevate legs to the level of the heart and pump ankles as often as possible Elevate leg(s) parallel to the floor when sitting. WOUND #4: - Lower Leg Wound Laterality: Left, Medial Cleanser: Byram Ancillary Kit - 15 Day Supply (DME) (Generic) 3 x Per Week/30 Days Discharge Instructions: Use supplies as instructed; Kit contains: (15) Saline Bullets; (15) 3x3 Gauze; 15 pr Gloves Cleanser: Soap and Water 3 x Per Week/30 Days Discharge Instructions: Gently cleanse wound with antibacterial soap, rinse and pat dry prior to dressing wounds Primary Dressing: IODOFLEX 0.9% Cadexomer Iodine Pad 3 x Per Week/30 Days Discharge Instructions: Apply Iodoflex to wound bed only as directed. Secondary Dressing: (SILICONE BORDER) Zetuvit Plus SILICONE BORDER Dressing 4x4 (in/in) (DME) (Generic) 3 x Per Week/30 Days Discharge Instructions: Please do not put silicone bordered dressings under wraps. Use non-bordered dressing only. Compression Wrap: tubi grip 3 x Per Week/30 Days Discharge Instructions: size F double layer 1. I would recommend currently based on what I am seeing that we go ahead and initiate treatment with a Iodoflex dressing which I think is good to be the best way to go. 2. I am also can recommend a bordered foam dressing to cover. 3. I am also can recommend the Tubigrip double layer size F that she should be wearing we may decrease this in size of her leg is better next week down to an E which I think would be more appropriate. The patient is in agreement with that plan. 4. She is going also follow-up with a vascular surgeon which I think would also be ideal she tells me she is going to give them a call when she checks out today. We will see patient back for reevaluation in 1 week here in the clinic. If anything worsens or changes patient will contact  our office for additional recommendations. Electronic Signature(s) Signed: 10/27/2021 4:23:30 PM By: Worthy Keeler PA-C Entered By: Worthy Keeler on 10/27/2021 16:23:30 [name], [name] ([id number]) -------------------------------------------------------------------------------- ROS/PFSH Details Patient Name: [name], [name] Date of Service: 10/27/2021 8:45 AM Medical Record Number: [id number] Patient Account Number: [id number] Date of Birth/Sex: 03/20/1986 (36 y.o. F) Treating RN: Carlene Coria Primary Care Provider: [name] Other Clinician: Referring Provider: Gifford Shave Treating Provider/Extender: Skipper Cliche in Treatment: 0 Information Obtained From Patient  Cardiovascular Medical History: Positive for: Hypertension; Peripheral Venous Disease Endocrine Medical History: Past Medical History Notes: pre DM, A1C 5.5 12/21 Immunizations Pneumococcal Vaccine: Received Pneumococcal Vaccination: No Implantable Devices None Hospitalization / Surgery History Type of Hospitalization/Surgery umb hernia repair right lower leg vein surgery Family and Social History Never smoker; Marital Status - Single; Alcohol Use: Rarely; Drug Use: Current History - marijuana; Caffeine Use: Rarely; Financial Concerns: No; Food, Clothing or Shelter Needs: No; Support System Lacking: No; Transportation Concerns: No Electronic Signature(s) Signed: 10/27/2021 10:45:13 AM By: Carlene Coria RN Signed: 10/27/2021 4:36:28 PM By: Worthy Keeler PA-C Entered By: Carlene Coria on 10/27/2021 09:04:02 [name], [name] ([id number]) -------------------------------------------------------------------------------- SuperBill Details Patient Name: GRISSEL, TYRELL. Date of Service: 10/27/2021 Medical Record Number: [id number] Patient Account Number: [id number] Date of Birth/Sex: November 28, 1985 (36 y.o. F) Treating RN: Carlene Coria Primary Care Provider: [name] Other Clinician: Referring  Provider: Gifford Shave Treating Provider/Extender: Skipper Cliche in Treatment: 0 Diagnosis Coding ICD-10 Codes Code Description I83.028 Varicose veins of left lower extremity with ulcer other part of lower leg I89.0 Lymphedema, not elsewhere classified L97.822 Non-pressure chronic ulcer of other part of left lower leg with fat layer exposed E66.01 Morbid (severe) obesity due to excess calories Facility Procedures CPT4 Code: 83437357 Description: 99214 - WOUND CARE VISIT-LEV 4 EST PT Modifier: Quantity: 1 Physician Procedures CPT4 Code: 8978478 Description: 41282 - WC PHYS LEVEL 4 - EST PT Modifier: Quantity: 1 CPT4 Code: Description: ICD-10 Diagnosis Description I83.028 Varicose veins of left lower extremity with ulcer other part of lower l I89.0 Lymphedema, not elsewhere classified L97.822 Non-pressure chronic ulcer of other part of left lower leg with fat lay E66.01  Morbid (severe) obesity due to excess calories Modifier: eg er exposed Quantity: Electronic Signature(s) Signed: 10/27/2021 4:23:43 PM By: Worthy Keeler PA-C Previous Signature: 10/27/2021 10:45:13 AM Version By: Carlene Coria RN Entered By: Worthy Keeler on 10/27/2021 16:23:43

## 2021-11-03 ENCOUNTER — Encounter: Payer: BC Managed Care – PPO | Admitting: Physician Assistant

## 2021-11-03 DIAGNOSIS — I83028 Varicose veins of left lower extremity with ulcer other part of lower leg: Secondary | ICD-10-CM | POA: Diagnosis not present

## 2021-11-03 NOTE — Progress Notes (Signed)
[name] ([id number]) Visit Report for 11/03/2021 Arrival Information Details Patient Name: [name], [name]. Date of Service: 11/03/2021 11:15 AM Medical Record Number: [id number] Patient Account Number: [id number] Date of Birth/Sex: February 15, 1986 (36 y.o. F) Treating RN: Yevonne Pax Primary Care Shelbia Scinto: [name] Other Clinician: [name] Referring Alexie Samson: [name] Treating Kadden Osterhout/Extender: Rowan Blase in Treatment: 1 Visit Information History Since Last Visit All ordered tests and consults were completed: No Patient Arrived: Ambulatory Added or deleted any medications: No Arrival Time: 11:57 Any new allergies or adverse reactions: No Transfer Assistance: None Had a fall or experienced change in No Patient Requires Transmission-Based Precautions: No activities of daily living that may affect Patient Has Alerts: No risk of falls: Hospitalized since last visit: No Pain Present Now: Yes Electronic Signature(s) Unsigned Entered ByBetha Loa on 11/03/2021 11:59:16 Signature(s): Date(s): [name] ([id number]) -------------------------------------------------------------------------------- Lower Extremity Assessment Details Patient Name: [name], [name]. Date of Service: 11/03/2021 11:15 AM Medical Record Number: [id number] Patient Account Number: [id number] Date of Birth/Sex: 08-28-1985 (36 y.o. F) Treating RN: Yevonne Pax Primary Care Williette Loewe: [name] Other Clinician: [name] Referring Jonavin Seder: [name] Treating Rahmel Nedved/Extender: Rowan Blase in Treatment: 1 Electronic Signature(s) Unsigned Entered By: [name] on 11/03/2021 12:21:11 Signature(s): Date(s): [name], [name] ([id number]) -------------------------------------------------------------------------------- Multi Wound Chart Details Patient Name: [name]. Date of Service: 11/03/2021 11:15 AM Medical Record Number:  [id number] Patient Account Number: [id number] Date of Birth/Sex: [birth year] (36 y.o. F) Treating RN: Yevonne Pax Primary Care Karolee Meloni: [name] Other Clinician: [name] Referring Anouk Critzer: [name] Treating Fatiha Guzy/Extender: Rowan Blase in Treatment: 1 Vital Signs Height(in): 66 Pulse(bpm): 89 Weight(lbs): 400 Blood Pressure(mmHg): 127/86 Body Mass Index(BMI): 64.6 Temperature(F): 98.4 Respiratory Rate(breaths/min): 16 Photos: [4:No Photos] [N/A:N/A] Wound Location: [4:Left, Medial Lower Leg] [N/A:N/A] Wounding Event: [4:Gradually Appeared] [N/A:N/A] Primary Etiology: [4:Venous Leg Ulcer] [N/A:N/A] Comorbid History: [4:Hypertension, Peripheral Venous Disease] [N/A:N/A] Date Acquired: [4:10/17/2021] [N/A:N/A] Weeks of Treatment: [4:1] [N/A:N/A] Wound Status: [4:Open] [N/A:N/A] Wound Recurrence: [4:No] [N/A:N/A] Measurements L x W x D (cm) [4:1.4x2x0.2] [N/A:N/A] Area (cm) : [4:2.199] [N/A:N/A] Volume (cm) : [4:0.44] [N/A:N/A] % Reduction in Area: [4:-299.80%] [N/A:N/A] % Reduction in Volume: [4:-700.00%] [N/A:N/A] Classification: [4:Full Thickness Without Exposed Support Structures] [N/A:N/A] Exudate Amount: [4:Medium] [N/A:N/A] Exudate Type: [4:Serosanguineous] [N/A:N/A] Exudate Color: [4:red, brown] [N/A:N/A] Granulation Amount: [4:Medium (34-66%)] [N/A:N/A] Granulation Quality: [4:Red] [N/A:N/A] Necrotic Amount: [4:Medium (34-66%)] [N/A:N/A] Exposed Structures: [4:Fat Layer (Subcutaneous Tissue): Yes Fascia: No Tendon: No Muscle: No Joint: No Bone: No None] [N/A:N/A N/A] Treatment Notes Electronic Signature(s) Unsigned Entered ByBetha Loa on 11/03/2021 12:21:22 Signature(s): Date(s): [name] ([id number]) -------------------------------------------------------------------------------- Multi-Disciplinary Care Plan Details Patient Name: [name], [name]. Date of Service: 11/03/2021 11:15 AM Medical Record Number:  [id number] Patient Account Number: [id number] Date of Birth/Sex: [birth year] (36 y.o. F) Treating RN: Yevonne Pax Primary Care Ronique Simerly: [name] Other Clinician: [name] Referring Alarik Radu: [name] Treating Rane Blitch/Extender: Rowan Blase in Treatment: 1 Active Inactive Wound/Skin Impairment Nursing Diagnoses: Knowledge deficit related to ulceration/compromised skin integrity Goals: Patient/caregiver will verbalize understanding of skin care regimen Date Initiated: 10/27/2021 Target Resolution Date: 11/27/2021 Goal Status: Active Ulcer/skin breakdown will have a volume reduction of 30% by week 4 Date Initiated: 10/27/2021 Target Resolution Date: 12/28/2021 Goal Status: Active Ulcer/skin breakdown will have a volume reduction of 50% by week 8 Date Initiated: 10/27/2021 Target Resolution Date: 01/27/2022 Goal Status: Active Ulcer/skin breakdown will have a volume reduction of 80% by week 12 Date Initiated: 10/27/2021 Target Resolution Date:  02/27/2022 Goal Status: Active Ulcer/skin breakdown will heal within 14 weeks Date Initiated: 10/27/2021 Target Resolution Date: 03/29/2022 Goal Status: Active Interventions: Assess patient/caregiver ability to obtain necessary supplies Assess patient/caregiver ability to perform ulcer/skin care regimen upon admission and as needed Assess ulceration(s) every visit Notes: Electronic Signature(s) Unsigned Entered By: [name] on 11/03/2021 12:21:15 Signature(s): Date(s): [name] ([id number]) -------------------------------------------------------------------------------- Pain Assessment Details Patient Name: [name], [name]. Date of Service: 11/03/2021 11:15 AM Medical Record Number: [id number] Patient Account Number: [id number] Date of Birth/Sex: 03/03/1986 (36 y.o. F) Treating RN: Yevonne Pax Primary Care Andrianna Manalang: [name] Other Clinician: [name] Referring Kymani Laursen: [name] Treating Bassem Bernasconi/Extender: Rowan Blase in Treatment: 1 Active Problems Location of Pain Severity and Description of Pain Patient Has Paino Yes Site Locations Pain Location: Pain in Ulcers Duration of the Pain. Constant / Intermittento Intermittent Rate the pain. Current Pain Level: 10 Character of Pain Describe the Pain: Burning, Sharp, Other: stinging Pain Management and Medication Current Pain Management: Medication: Yes Rest: Yes Electronic Signature(s) Unsigned Entered By: [name] on 11/03/2021 12:06:00 Signature(s): Date(s): [name] ([id number]) -------------------------------------------------------------------------------- Wound Assessment Details Patient Name: [name], [name]. Date of Service: 11/03/2021 11:15 AM Medical Record Number: [id number] Patient Account Number: [id number] Date of Birth/Sex: 1986/02/15 (36 y.o. F) Treating RN: Yevonne Pax Primary Care Chayce Robbins: [name] Other Clinician: [name] Referring Jelene Albano: [name] Treating Diann Bangerter/Extender: Rowan Blase in Treatment: 1 Wound Status Wound Number: 4 Primary Etiology: Venous Leg Ulcer Wound Location: Left, Medial Lower Leg Wound Status: Open Wounding Event: Gradually Appeared Comorbid History: Hypertension, Peripheral Venous Disease Date Acquired: 10/17/2021 Weeks Of Treatment: 1 Clustered Wound: No Wound Measurements Length: (cm) 1.4 Width: (cm) 2 Depth: (cm) 0.2 Area: (cm) 2.199 Volume: (cm) 0.44 % Reduction in Area: -299.8% % Reduction in Volume: -700% Epithelialization: None Wound Description Classification: Full Thickness Without Exposed Support Structu Exudate Amount: Medium Exudate Type: Serosanguineous Exudate Color: red, brown res Foul Odor After Cleansing: No Slough/Fibrino No Wound Bed Granulation Amount: Medium (34-66%) Exposed Structure Granulation Quality: Red Fascia Exposed: No Necrotic Amount: Medium  (34-66%) Fat Layer (Subcutaneous Tissue) Exposed: Yes Necrotic Quality: Adherent Slough Tendon Exposed: No Muscle Exposed: No Joint Exposed: No Bone Exposed: No Electronic Signature(s) Unsigned Entered ByBetha Loa on 11/03/2021 12:21:01 Signature(s): Date(s): [name] ([id number]) -------------------------------------------------------------------------------- Vitals Details Patient Name: LALANA, [name]. Date of Service: 11/03/2021 11:15 AM Medical Record Number: [id number] Patient Account Number: [id number] Date of Birth/Sex: 1985-06-01 (36 y.o. F) Treating RN: Yevonne Pax Primary Care Florenda Watt: [name] Other Clinician: [name] Referring Jenavive Lamboy: [name] Treating Silvana Holecek/Extender: Rowan Blase in Treatment: 1 Vital Signs Time Taken: 12:00 Temperature (F): 98.4 Height (in): 66 Pulse (bpm): 89 Weight (lbs): 400 Respiratory Rate (breaths/min): 16 Body Mass Index (BMI): 64.6 Blood Pressure (mmHg): 127/86 Reference Range: 80 - 120 mg / dl Electronic Signature(s) Unsigned Entered ByBetha Loa on 11/03/2021 12:05:50 Signature(s): Date(s):

## 2021-11-03 NOTE — Progress Notes (Addendum)
[name], [name] ([id number]) Visit Report for 11/03/2021 Chief Complaint Document Details Patient Name: [name], [name]. Date of Service: 11/03/2021 11:15 AM Medical Record Number: [id number] Patient Account Number: [id number] Date of Birth/Sex: [birth year] (36 y.o. F) Treating RN: Carlene Coria Primary Care Provider: [name] Other Clinician: [name] Referring Provider: [name] Treating Provider/Extender: Skipper Cliche in Treatment: 1 Information Obtained from: Patient Chief Complaint Left LE Ulcer Electronic Signature(s) Signed: 11/03/2021 11:40:23 AM By: Worthy Keeler PA-C Entered By: Worthy Keeler on 11/03/2021 11:40:23 [name], [name] ([id number]) -------------------------------------------------------------------------------- HPI Details Patient Name: [name] Date of Service: 11/03/2021 11:15 AM Medical Record Number: [id number] Patient Account Number: [id number] Date of Birth/Sex: 12-01-1985 (36 y.o. F) Treating RN: Carlene Coria Primary Care Provider: [name] Other Clinician: [name] Referring Provider: [name] Treating Provider/Extender: Skipper Cliche in Treatment: 1 History of Present Illness HPI Description: 36 year old patient who started with having ulcerations on the right lower leg on the lateral part of her ankle for about 2 weeks. She was seen in the ER at [facility] and advised to see the wound care for a consultation. No X-rays of workup was done during the ER visit and no prescription for any medications of compression wraps were given. the patient is not diabetic but does have hypertension and her medications have been reviewed by me. 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 The right and left greater saphenous vein was found to  be tortuous. Deep venous system was also not competent and there was reflux of more than 500 ms. She was then seen by Dr. Sherren Mocha Early who recommended that the patient would not benefit from endovenous ablation and he had recommended vein stripping on the right side and multiple small phlebectomy procedures on the left side. the patient did not follow-up due to social economic reasons. She has not been wearing any compression stockings and has not taken any specific treatment for varicose veins for the last 3 years. 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 We have obtained some appointments to see the vascular surgeons by the end of the month and the patient would like to follow up with me at my [facility] on Wednesday, June 29. 10/14/2014 -- she could not see me yesterday in [place] and hence has come for a review today. She has a vascular workup to be done this afternoon at [facility]. She is doing fine otherwise. 10/22/2014 -- she was seen by Dr. Curt Jews and he has recommended surgical removal of her right saphenous vein from distal thigh to saphenofemoral junction and stab phlebectomy's of multiple large tributary branches throughout her thigh and calf. This would be done under general anesthesia in the outpatient setting. 10/29/2014 -- she is trying to work on a surgical date and in the meanwhile we have got insurance clearance for Apligraf and we will start this next week. 7/22 2016 -- she is here for the first application of Apligraf. 11/19/2014 -- she is here for a second application of Apligraf 11/26/2014 -- she has done fine after her last application of Apligraf and is awaiting her surgery which is scheduled for August 31. 12/03/2014 -- she is doing fine and is here  for her third application of Apligraf. 12/21/2014 -- She had surgery on 12/15/2014 by Dr.Early who did #1  ligation and stripping of right great saphenous vein from distal thigh to saphenofemoral junction, #2 stab phlebectomy of large tributary varicose veins in the thigh popliteal space and calf. She had an Ace wrap up to her groin and this was removed today and the Unna's boot was also removed. 12/28/2014 -- she is here for her fourth application of Apligraf. 01/06/2015 - he saw her vascular surgeon Dr. Donnetta Hutching who was pleased with her progress and he has confirmed that no surgical procedures could be attempted on the left side. 01/13/2015 -- her wound looks very good and she's been having no problems whatsoever. Readmission: 07/26/2020 upon evaluation today patient presents for initial inspection here in our clinic for a new issue with her left leg although she is previously been seen due to issues with the right leg back in 2016. At that time she was seeing Dr. Donnetta Hutching who is a vein/vascular specialist in [place]. He has since semiretired from what I understand. He is working out of [employer] I believe. Nonetheless she tells me at the time that there was really nothing to do for her left leg although the right leg was where they did most of the work. Subsequently she states that she is done fairly well until just in the past week where she had issues with bleeding from what appears to be varicose vein on the left leg medially. Unfortunately this has continued to be an issue although she tells me at first it was coming much more significantly Down quite a bit but nonetheless has not completely resolved. Every time she showers she notices that it starts to drain a little bit more. She does have a history of chronic venous insufficiency, lymphedema, varicose veins bilaterally, and obesity. 08/02/2020 upon evaluation today patient appears to be doing about the same in regards to the ulcer on her left leg. She has some eschar covering there is definitely some fluid collecting underneath unfortunately. With  that being said she tells me she is still having a tremendous amount of pain therefore she is really not able to allow me to clean this off very effectively to be perfectly honest. I think we need to try to soften this up 08/16/2020 upon evaluation today patient's wound is really not doing significantly better not really states about the same. She notes that the wrap just does not seem to be staying up very well at all unfortunately. No fevers, chills, nausea, vomiting, or diarrhea. She did cut it off once it starts to slide in order to alleviate some of the pressure from sliding Down. Fortunately there is no signs of active infection at this time which is great news. 08/23/2020 upon evaluation today patient appears to be doing well 08/23/2020 upon evaluation today patient appears to be doing well with regard to her wound all things considered. Fortunately there does not appear to be any signs of active infection at this time which is great news. She has been tolerating the dressing changes without complication and overall I am extremely pleased with where things stand at this point. She does have her appointment with vascular in [facility] on June 9. [name], [name] ([id number]) 08/30/2020 upon evaluation today patient actually appears to be doing decently well in regard to her wound. Fortunately there is no signs of active infection which is great news. Nonetheless I do believe that the patient is going require little  bit of debridement if she is okay with me attempting that today I think that will help clean off some of the necrotic tissue. Fortunately there does not appear to be otherwise any evidence of active infection which is also great news. 09/19/2020 upon evaluation today patient appears to be doing a little better in regard to her wound as compared to previous. Fortunately there does not appear to be any signs of active infection overall. No fever chills noted. I do believe that the Iodosorb  has made this a little bit better with regard to the overall size and appearance of the wound bed though again she does still have quite a ways to go to get this to heal she still very tender to touch. 09/27/2020 upon evaluation today patient appears to actually be doing quite well with regard to her wound. This is measuring much smaller which is great news. With that being said she did see vein and vascular in [facility] and they subsequently recommended that surgery is really what she probably needs to go forward with sounds like the potential for venous ablation. With that being said the patient tells me this is just not the right time for her to be able to proceed with any type of surgery which I completely understand. Nonetheless I do believe that she would continue to benefit from compression but again that is really not something that she is able to easily do. 10/04/2020 upon evaluation today patient appears to be doing about the same in regard to her wound. This is measuring a little bit smaller but nonetheless still is open and again has some slough and biofilm noted on the surface of the wound. There does not appear to be any signs of active infection which is great news. No fevers, chills, nausea, vomiting, or diarrhea. 10/04/2020 upon evaluation today patient appears to be doing well with regard to her wound. She has been tolerating dressing changes without complication. Fortunately there does not appear to be any signs of active infection which is great news. No fevers, chills, nausea, vomiting, or diarrhea. 10/25/2020 upon evaluation today patient appears to be doing well with regard to her wound. She has been tolerating the dressing changes without complication. Fortunately there is no signs of active infection at this time. No fevers, chills, nausea, vomiting, or diarrhea. 11/01/2020 upon evaluation today patient with regard to her wound. She has been tolerating the dressing changes without  complication. Fortunately there does not appear to be any signs of infection which is great news. No fever chills noted 11/15/2020 upon evaluation today patient appears to be doing well with regard to her wound. Fortunately there is no signs of active infection at this time. No fevers, chills, nausea, vomiting, or diarrhea. With that being said she continues to have a significant amount of pain at the site even though this is very close to complete closure. She also had several varicose veins around the area which were also problematic. Overall however I feel like the patient is making excellent progress. 11/28/2020 upon evaluation today patient appears to be doing well with regard to her leg ulcer. Again were not really able to debride or compression wrap her due to discomfort and pain. She does not allow for that. With that being said we have been using Iodosorb which does seem to be doing decently well. Fortunately there is no signs of active infection at this time which is great news. No fevers, chills, nausea, vomiting, or diarrhea. 8/31; patient presents  for 2-week follow-up. She has been using Iodosorb. She reports that the wound is closed. She denies signs of infection. Readmission: 10-27-2021 upon evaluation this is a patient that presents today whom I have previously seen this is pretty much for the same issue though I think a little bit higher than the last time I saw her. She does have a history of chronic venous insufficiency and hypertension along with varicose veins. Subsequently she does have an ulceration which spontaneously ruptured she has not been wearing any compression which I think is a big part of the issue here. We discussed this before I really think she probably needs to be wearing her compression therapy, she probably needs lymphedema pumps if she can ever wear the compression for a significant amount of time to get these, and subsequently also think that she needs to be  elevating her legs is much as possible she may even need some vascular intervention in regard to her veins. All of this was reiterated and discussed with her today to reinforce what needs to happen in order to ensure that her legs do not get a lot worse. The patient voiced understanding. She tells me that she knows because she is seeing her mom go through a lot of this as well how bad things can get. 11-03-2021 upon evaluation today patient appears to be doing well with regard to her wound. Fortunately there does not appear to be any signs of active infection at this time. She is measuring a little bit bigger but I think this is because the wound is actually cleaning up a bit here. Electronic Signature(s) Signed: 11/03/2021 1:04:34 PM By: Worthy Keeler PA-C Entered By: Worthy Keeler on 11/03/2021 13:04:34 [name], [name] ([id number]) -------------------------------------------------------------------------------- Physical Exam Details Patient Name: [name], [name]. Date of Service: 11/03/2021 11:15 AM Medical Record Number: [id number] Patient Account Number: [id number] Date of Birth/Sex: [birth year] (36 y.o. F) Treating RN: Carlene Coria Primary Care Provider: [name] Other Clinician: [name] Referring Provider: [name] Treating Provider/Extender: Skipper Cliche in Treatment: 1 Constitutional Obese and well-hydrated in no acute distress. Respiratory normal breathing without difficulty. Psychiatric this patient is able to make decisions and demonstrates good insight into disease process. Alert and Oriented x 3. pleasant and cooperative. Notes Upon inspection patient's wound does not show any signs of infection but unfortunately is still having issues here with some breakdown. Fortunately I do not see any evidence of active infection locally or systemically which is great news. No fevers, chills, nausea, vomiting, or diarrhea. Electronic Signature(s) Signed:  11/03/2021 1:05:10 PM By: Worthy Keeler PA-C Entered By: Worthy Keeler on 11/03/2021 13:05:09 [name], [name] ([id number]) -------------------------------------------------------------------------------- Physician Orders Details Patient Name: [name], [name] Date of Service: 11/03/2021 11:15 AM Medical Record Number: [id number] Patient Account Number: [id number] Date of Birth/Sex: 1986/02/28 (36 y.o. F) Treating RN: Carlene Coria Primary Care Provider: [name] Other Clinician: [name] Referring Provider: [name] Treating Provider/Extender: Skipper Cliche in Treatment: 1 Verbal / Phone Orders: No Diagnosis Coding ICD-10 Coding Code Description I83.028 Varicose veins of left lower extremity with ulcer other part of lower leg I89.0 Lymphedema, not elsewhere classified L97.822 Non-pressure chronic ulcer of other part of left lower leg with fat layer exposed E66.01 Morbid (severe) obesity due to excess calories Follow-up Appointments o Return Appointment in 1 week. Bathing/ Shower/ Hygiene o May shower; gently cleanse wound with antibacterial soap, rinse and pat dry prior to dressing wounds Edema Control - Lymphedema /  Segmental Compressive Device / Other o Tubigrip double layer applied - size F o Elevate, Exercise Daily and Avoid Standing for Long Periods of Time. o Elevate legs to the level of the heart and pump ankles as often as possible o Elevate leg(s) parallel to the floor when sitting. Wound Treatment Wound #4 - Lower Leg Wound Laterality: Left, Medial Cleanser: Byram Ancillary Kit - 15 Day Supply (Generic) 3 x Per Week/30 Days Discharge Instructions: Use supplies as instructed; Kit contains: (15) Saline Bullets; (15) 3x3 Gauze; 15 pr Gloves Cleanser: Soap and Water 3 x Per Week/30 Days Discharge Instructions: Gently cleanse wound with antibacterial soap, rinse and pat dry prior to dressing wounds Primary Dressing: IODOFLEX 0.9%  Cadexomer Iodine Pad 3 x Per Week/30 Days Discharge Instructions: Apply Iodoflex to wound bed only as directed. Secondary Dressing: (SILICONE BORDER) Zetuvit Plus SILICONE BORDER Dressing 4x4 (in/in) (Generic) 3 x Per Week/30 Days Discharge Instructions: Please do not put silicone bordered dressings under wraps. Use non-bordered dressing only. Compression Wrap: tubi grip 3 x Per Week/30 Days Discharge Instructions: size F double layer Electronic Signature(s) Signed: 11/03/2021 4:22:23 PM By: Worthy Keeler PA-C Signed: 11/03/2021 4:34:39 PM By: [name] Entered By: [name] on 11/03/2021 12:27:58 [name], [name] ([id number]) -------------------------------------------------------------------------------- Problem List Details Patient Name: [name], [name]. Date of Service: 11/03/2021 11:15 AM Medical Record Number: [id number] Patient Account Number: [id number] Date of Birth/Sex: 1985-12-26 (36 y.o. 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 F) Treating RN: Carlene Coria Primary Care Provider: [name] Other Clinician: [name] Referring Provider: [name] Treating Provider/Extender: Skipper Cliche in Treatment: 1 Subjective Chief Complaint Information obtained from Patient Left LE Ulcer History of Present Illness (HPI) 36 year old patient who started with having ulcerations on the right lower leg on the lateral part of her ankle for about 2 weeks. She was seen in the ER at [facility] and advised to see the wound care for a consultation. No X-rays of workup was done during the ER visit and no prescription for any medications of compression wraps were given. the patient is not diabetic but does have hypertension and her medications have been reviewed by me. 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 The right and left greater saphenous vein was found to be tortuous. Deep venous system was also not competent and there was reflux of more than 500 ms. She was then seen by Dr. Sherren Mocha Early who recommended that the patient would not benefit from endovenous ablation and he had  recommended vein stripping on the right side and multiple small phlebectomy procedures on the left side. the patient did not follow-up due to social economic reasons. She has not been wearing any compression stockings and has not taken any specific treatment for varicose veins for the last 3 years. 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 We have obtained some appointments to see the vascular surgeons by the end of the month and the patient would like to follow up  with me at my [facility] on Wednesday, June 29. 10/14/2014 -- she could not see me yesterday in [place] and hence has come for a review today. She has a vascular workup to be done this afternoon at [facility]. She is doing fine otherwise. 10/22/2014 -- she was seen by Dr. Curt Jews and he has recommended surgical removal of her right saphenous vein from distal thigh to saphenofemoral junction and stab phlebectomy's of multiple large tributary branches throughout her thigh and calf. This would be done under general anesthesia in the outpatient setting. 10/29/2014 -- she is trying to work on a surgical date and in the meanwhile we have got insurance clearance for Apligraf and we will start this next week. 7/22 2016 -- she is here for the first application of Apligraf. 11/19/2014 -- she is here for a second application of Apligraf 11/26/2014 -- she has done fine after her last application of Apligraf and is awaiting her surgery which is scheduled for August 31. 12/03/2014 -- she is doing fine and is here for her third application of Apligraf. 12/21/2014 -- She had surgery on 12/15/2014 by Dr.Early who did #1 ligation and stripping of right great saphenous vein from distal thigh to saphenofemoral junction, #2 stab phlebectomy of large tributary varicose veins in the thigh popliteal space and calf. She had an Ace wrap up to her groin and this was removed today and the Unna's boot was also removed. 12/28/2014 -- she is here for her fourth application of Apligraf. 01/06/2015 - he saw her vascular surgeon Dr. Donnetta Hutching who was pleased with her progress and he has confirmed that no surgical procedures could be attempted on the left side. 01/13/2015 -- her wound looks very good and she's been having no problems whatsoever. Readmission: 07/26/2020 upon evaluation today patient presents for initial inspection here in our clinic for a new issue with her left leg although she is previously been seen  due to issues with the right leg back in 2016. At that time she was seeing Dr. Donnetta Hutching who is a vein/vascular specialist in [place]. He has since semiretired from what I understand. He is working out of [employer] I believe. Nonetheless she tells me at the time that there was really nothing to do for her left leg although the right leg was where they did most of the work. Subsequently she states that she is done fairly well until just in the past week where she had issues with bleeding from what appears to be varicose vein on the left leg medially. Unfortunately this has continued to be an issue although she tells me at first it was coming much more significantly Down quite a bit but nonetheless has not completely resolved. Every time she showers she notices that it starts to drain a little bit more. She does have a history of chronic venous insufficiency, lymphedema, varicose veins bilaterally, and obesity. 08/02/2020 upon evaluation today patient appears  to be doing about the same in regards to the ulcer on her left leg. She has some eschar covering there is definitely some fluid collecting underneath unfortunately. With that being said she tells me she is still having a tremendous amount of pain therefore she is really not able to allow me to clean this off very effectively to be perfectly honest. I think we need to try to soften this up 08/16/2020 upon evaluation today patient's wound is really not doing significantly better not really states about the same. She notes that the wrap just does not seem to be staying up very well at all unfortunately. No fevers, chills, nausea, vomiting, or diarrhea. She did cut it off once it starts to slide in order to alleviate some of the pressure from sliding Down. Fortunately there is no signs of active infection at this time which is great news. [name], [name] ([id number]) 08/23/2020 upon evaluation today patient appears to be doing well 08/23/2020 upon  evaluation today patient appears to be doing well with regard to her wound all things considered. Fortunately there does not appear to be any signs of active infection at this time which is great news. She has been tolerating the dressing changes without complication and overall I am extremely pleased with where things stand at this point. She does have her appointment with vascular in [facility] on June 9. 08/30/2020 upon evaluation today patient actually appears to be doing decently well in regard to her wound. Fortunately there is no signs of active infection which is great news. Nonetheless I do believe that the patient is going require little bit of debridement if she is okay with me attempting that today I think that will help clean off some of the necrotic tissue. Fortunately there does not appear to be otherwise any evidence of active infection which is also great news. 09/19/2020 upon evaluation today patient appears to be doing a little better in regard to her wound as compared to previous. Fortunately there does not appear to be any signs of active infection overall. No fever chills noted. I do believe that the Iodosorb has made this a little bit better with regard to the overall size and appearance of the wound bed though again she does still have quite a ways to go to get this to heal she still very tender to touch. 09/27/2020 upon evaluation today patient appears to actually be doing quite well with regard to her wound. This is measuring much smaller which is great news. With that being said she did see vein and vascular in [facility] and they subsequently recommended that surgery is really what she probably needs to go forward with sounds like the potential for venous ablation. With that being said the patient tells me this is just not the right time for her to be able to proceed with any type of surgery which I completely understand. Nonetheless I do believe that she would continue to  benefit from compression but again that is really not something that she is able to easily do. 10/04/2020 upon evaluation today patient appears to be doing about the same in regard to her wound. This is measuring a little bit smaller but nonetheless still is open and again has some slough and biofilm noted on the surface of the wound. There does not appear to be any signs of active infection which is great news. No fevers, chills, nausea, vomiting, or diarrhea. 10/04/2020 upon evaluation today patient appears to be doing well with  regard to her wound. She has been tolerating dressing changes without complication. Fortunately there does not appear to be any signs of active infection which is great news. No fevers, chills, nausea, vomiting, or diarrhea. 10/25/2020 upon evaluation today patient appears to be doing well with regard to her wound. She has been tolerating the dressing changes without complication. Fortunately there is no signs of active infection at this time. No fevers, chills, nausea, vomiting, or diarrhea. 11/01/2020 upon evaluation today patient with regard to her wound. She has been tolerating the dressing changes without complication. Fortunately there does not appear to be any signs of infection which is great news. No fever chills noted 11/15/2020 upon evaluation today patient appears to be doing well with regard to her wound. Fortunately there is no signs of active infection at this time. No fevers, chills, nausea, vomiting, or diarrhea. With that being said she continues to have a significant amount of pain at the site even though this is very close to complete closure. She also had several varicose veins around the area which were also problematic. Overall however I feel like the patient is making excellent progress. 11/28/2020 upon evaluation today patient appears to be doing well with regard to her leg ulcer. Again were not really able to debride or compression wrap her due to  discomfort and pain. She does not allow for that. With that being said we have been using Iodosorb which does seem to be doing decently well. Fortunately there is no signs of active infection at this time which is great news. No fevers, chills, nausea, vomiting, or diarrhea. 8/31; patient presents for 2-week follow-up. She has been using Iodosorb. She reports that the wound is closed. She denies signs of infection. Readmission: 10-27-2021 upon evaluation this is a patient that presents today whom I have previously seen this is pretty much for the same issue though I think a little bit higher than the last time I saw her. She does have a history of chronic venous insufficiency and hypertension along with varicose veins. Subsequently she does have an ulceration which spontaneously ruptured she has not been wearing any compression which I think is a big part of the issue here. We discussed this before I really think she probably needs to be wearing her compression therapy, she probably needs lymphedema pumps if she can ever wear the compression for a significant amount of time to get these, and subsequently also think that she needs to be elevating her legs is much as possible she may even need some vascular intervention in regard to her veins. All of this was reiterated and discussed with her today to reinforce what needs to happen in order to ensure that her legs do not get a lot worse. The patient voiced understanding. She tells me that she knows because she is seeing her mom go through a lot of this as well how bad things can get. 11-03-2021 upon evaluation today patient appears to be doing well with regard to her wound. Fortunately there does not appear to be any signs of active infection at this time. She is measuring a little bit bigger but I think this is because the wound is actually cleaning up a bit here. Objective Constitutional Obese and well-hydrated in no acute distress. Vitals Time  Taken: 12:00 PM, Height: 66 in, Weight: 400 lbs, BMI: 64.6, Temperature: 98.4 F, Pulse: 89 bpm, Respiratory Rate: 16 breaths/min, Blood Pressure: 127/86 mmHg. Respiratory normal breathing without difficulty. [name], [name] ([id number]) Psychiatric  this patient is able to make decisions and demonstrates good insight into disease process. Alert and Oriented x 3. pleasant and cooperative. General Notes: Upon inspection patient's wound does not show any signs of infection but unfortunately is still having issues here with some breakdown. Fortunately I do not see any evidence of active infection locally or systemically which is great news. No fevers, chills, nausea, vomiting, or diarrhea. Integumentary (Hair, Skin) Wound #4 status is Open. Original cause of wound was Gradually Appeared. The date acquired was: 10/17/2021. The wound has been in treatment 1 weeks. The wound is located on the Left,Medial Lower Leg. The wound measures 1.4cm length x 2cm width x 0.2cm depth; 2.199cm^2 area and 0.44cm^3 volume. There is Fat Layer (Subcutaneous Tissue) exposed. There is a medium amount of serosanguineous drainage noted. There is medium (34-66%) red granulation within the wound bed. There is a medium (34-66%) amount of necrotic tissue within the wound bed including Adherent Slough. 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 Bathing/ Shower/ Hygiene: May shower; gently cleanse wound with antibacterial soap, rinse and pat dry prior to dressing wounds Edema Control - Lymphedema / Segmental Compressive Device / Other: Tubigrip double layer applied - size F Elevate, Exercise Daily and Avoid Standing for Long Periods of  Time. Elevate legs to the level of the heart and pump ankles as often as possible Elevate leg(s) parallel to the floor when sitting. WOUND #4: - Lower Leg Wound Laterality: Left, Medial Cleanser: Byram Ancillary Kit - 15 Day Supply (Generic) 3 x Per Week/30 Days Discharge Instructions: Use supplies as instructed; Kit contains: (15) Saline Bullets; (15) 3x3 Gauze; 15 pr Gloves Cleanser: Soap and Water 3 x Per Week/30 Days Discharge Instructions: Gently cleanse wound with antibacterial soap, rinse and pat dry prior to dressing wounds Primary Dressing: IODOFLEX 0.9% Cadexomer Iodine Pad 3 x Per Week/30 Days Discharge Instructions: Apply Iodoflex to wound bed only as directed. Secondary Dressing: (SILICONE BORDER) Zetuvit Plus SILICONE BORDER Dressing 4x4 (in/in) (Generic) 3 x Per Week/30 Days Discharge Instructions: Please do not put silicone bordered dressings under wraps. Use non-bordered dressing only. Compression Wrap: tubi grip 3 x Per Week/30 Days Discharge Instructions: size F double layer 1. I am going to recommend that we go ahead and continue with the wound care measures as before using the Iodosorb which I think is helping to clean this off this is good news. 2. I am also can recommend that we have the patient continue to utilize the Tubigrip which I think is doing well to help with some edema control here. 3. I am also going to suggest that the patient should continue with the elevation of her leg is much as possible. She has seen vascular they are going to proceed with the vein stripping but they have to get everything approved through her insurance. We will see patient back for reevaluation in 1 week here in the clinic. If anything worsens or changes patient will contact our office for additional recommendations. Electronic Signature(s) Signed: 11/03/2021 1:05:53 PM By: Worthy Keeler PA-C Entered By: Worthy Keeler on 11/03/2021 13:05:53 [name], [name] ([id number]) [address]  [name] ([id number]) -------------------------------------------------------------------------------- SuperBill Details Patient Name: KHALA, TARTE Date of Service: 11/03/2021 Medical Record Number: [id number] Patient  Account Number: [id number] Date of Birth/Sex: 12-10-1985 (36 y.o. F) Treating RN: Carlene Coria Primary Care Provider: [name] Other Clinician: [name] Referring Provider: [name] Treating Provider/Extender: Skipper Cliche in Treatment: 1 Diagnosis Coding ICD-10 Codes Code Description I83.028 Varicose veins of left lower extremity with ulcer other part of lower leg I89.0 Lymphedema, not elsewhere classified L97.822 Non-pressure chronic ulcer of other part of left lower leg with fat layer exposed E66.01 Morbid (severe) obesity due to excess calories Facility Procedures CPT4 Code: [id number] Description: 99213 - WOUND CARE VISIT-LEV 3 EST PT Modifier: Quantity: 1 Physician Procedures CPT4 Code: 6751982 Description: 42998 - WC PHYS LEVEL 3 - EST PT Modifier: Quantity: 1 CPT4 Code: Description: ICD-10 Diagnosis Description I83.028 Varicose veins of left lower extremity with ulcer other part of lower l I89.0 Lymphedema, not elsewhere classified L97.822 Non-pressure chronic ulcer of other part of left lower leg with fat lay E66.01  Morbid (severe) obesity due to excess calories Modifier: eg er exposed Quantity: Electronic Signature(s) Signed: 11/03/2021 1:06:24 PM By: Worthy Keeler PA-C Entered By: Worthy Keeler on 11/03/2021 13:06:24

## 2021-11-09 ENCOUNTER — Encounter: Payer: BC Managed Care – PPO | Admitting: Internal Medicine

## 2021-11-09 DIAGNOSIS — I83028 Varicose veins of left lower extremity with ulcer other part of lower leg: Secondary | ICD-10-CM | POA: Diagnosis not present

## 2021-11-09 NOTE — Progress Notes (Signed)
[name], [name] ([id number]) Visit Report for 11/09/2021 HPI Details Patient Name: [name], [name]. Date of Service: 11/09/2021 10:30 AM Medical Record Number: [id number] Patient Account Number: [id number] Date of Birth/Sex: [birth year] (36 y.o. F) Treating RN: Cornell Barman Primary Care Provider: [name] Other Clinician: [name] Referring Provider: [name] Treating Provider/Extender: Tito Dine in Treatment: 1 History of Present Illness HPI Description: 36 year old patient who started with having ulcerations on the right lower leg on the lateral part of her ankle for about 2 weeks. She was seen in the ER at [facility] and advised to see the wound care for a consultation. No X-rays of workup was done during the ER visit and no prescription for any medications of compression wraps were given. the patient is not diabetic but does have hypertension and her medications have been reviewed by me. 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 The right and left greater saphenous vein was found to be tortuous. Deep venous system was also not competent and there was reflux of more than 500 ms. She was then seen by Dr. Sherren Mocha Early who recommended that the patient would not benefit from endovenous ablation and he had recommended vein stripping on the right side and multiple small phlebectomy procedures on the left side. the patient did not follow-up due to social economic reasons. She has not been wearing any compression stockings and has not taken any specific treatment for varicose veins for the last 3 years. 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 We have obtained some appointments to see the vascular  surgeons by the end of the month and the patient would like to follow up with me at my [facility] on Wednesday, June 29. 10/14/2014 -- she could not see me yesterday in [place] and hence has come for a review today. She has a vascular workup to be done this afternoon at [facility]. She is doing fine otherwise. 10/22/2014 -- she was seen by Dr. Curt Jews and he has recommended surgical removal of her right saphenous vein from distal thigh to saphenofemoral junction and stab phlebectomy's of multiple large tributary branches throughout her thigh and calf. This would be done under general anesthesia in the outpatient setting. 10/29/2014 -- she is trying to work on a surgical date and in the meanwhile we have got insurance clearance for Apligraf and we will start this next week. 7/22 2016 -- she is here for the first application of Apligraf. 11/19/2014 -- she is here for a second application of Apligraf 11/26/2014 -- she has done fine after her last application of Apligraf and is awaiting her surgery which is scheduled for August 31. 12/03/2014 -- she is doing fine and is here for her third application of Apligraf. 12/21/2014 -- She had surgery on 12/15/2014 by Dr.Early who did #1 ligation and stripping of right great saphenous vein from distal thigh to saphenofemoral junction, #2 stab phlebectomy of large tributary varicose veins in the thigh popliteal space and calf. She had an Ace wrap up to her groin and this was removed today and the Unna's boot was also removed. 12/28/2014 -- she is here for her fourth application of Apligraf. 01/06/2015 - he saw her vascular surgeon Dr.  Early who was pleased with her progress and he has confirmed that no surgical procedures could be attempted on the left side. 01/13/2015 -- her wound looks very good and she's been having no problems whatsoever. Readmission: 07/26/2020 upon evaluation today patient presents for initial inspection here in our clinic  for a new issue with her left leg although she is previously been seen due to issues with the right leg back in 2016. At that time she was seeing Dr. Donnetta Hutching who is a vein/vascular specialist in [place]. He has since semiretired from what I understand. He is working out of [employer] I believe. Nonetheless she tells me at the time that there was really nothing to do for her left leg although the right leg was where they did most of the work. Subsequently she states that she is done fairly well until just in the past week where she had issues with bleeding from what appears to be varicose vein on the left leg medially. Unfortunately this has continued to be an issue although she tells me at first it was coming much more significantly Down quite a bit but nonetheless has not completely resolved. Every time she showers she notices that it starts to drain a little bit more. She does have a history of chronic venous insufficiency, lymphedema, varicose veins bilaterally, and obesity. 08/02/2020 upon evaluation today patient appears to be doing about the same in regards to the ulcer on her left leg. She has some eschar covering there is definitely some fluid collecting underneath unfortunately. With that being said she tells me she is still having a tremendous amount of pain therefore she is really not able to allow me to clean this off very effectively to be perfectly honest. I think we need to try to soften this up 08/16/2020 upon evaluation today patient's wound is really not doing significantly better not really states about the same. She notes that the wrap just does not seem to be staying up very well at all unfortunately. No fevers, chills, nausea, vomiting, or diarrhea. She did cut it off once it starts to slide in order to alleviate some of the pressure from sliding Down. Fortunately there is no signs of active infection at this time which is great news. 08/23/2020 upon evaluation today patient  appears to be doing well 08/23/2020 upon evaluation today patient appears to be doing well with regard [name], [name]. ([id number]) to her wound all things considered. Fortunately there does not appear to be any signs of active infection at this time which is great news. She has been tolerating the dressing changes without complication and overall I am extremely pleased with where things stand at this point. She does have her appointment with vascular in [facility] on June 9. 08/30/2020 upon evaluation today patient actually appears to be doing decently well in regard to her wound. Fortunately there is no signs of active infection which is great news. Nonetheless I do believe that the patient is going require little bit of debridement if she is okay with me attempting that today I think that will help clean off some of the necrotic tissue. Fortunately there does not appear to be otherwise any evidence of active infection which is also great news. 09/19/2020 upon evaluation today patient appears to be doing a little better in regard to her wound as compared to previous. Fortunately there does not appear to be any signs of active infection overall. No fever chills noted. I do believe that the Iodosorb has  made this a little bit better with regard to the overall size and appearance of the wound bed though again she does still have quite a ways to go to get this to heal she still very tender to touch. 09/27/2020 upon evaluation today patient appears to actually be doing quite well with regard to her wound. This is measuring much smaller which is great news. With that being said she did see vein and vascular in [facility] and they subsequently recommended that surgery is really what she probably needs to go forward with sounds like the potential for venous ablation. With that being said the patient tells me this is just not the right time for her to be able to proceed with any type of surgery which I  completely understand. Nonetheless I do believe that she would continue to benefit from compression but again that is really not something that she is able to easily do. 10/04/2020 upon evaluation today patient appears to be doing about the same in regard to her wound. This is measuring a little bit smaller but nonetheless still is open and again has some slough and biofilm noted on the surface of the wound. There does not appear to be any signs of active infection which is great news. No fevers, chills, nausea, vomiting, or diarrhea. 10/04/2020 upon evaluation today patient appears to be doing well with regard to her wound. She has been tolerating dressing changes without complication. Fortunately there does not appear to be any signs of active infection which is great news. No fevers, chills, nausea, vomiting, or diarrhea. 10/25/2020 upon evaluation today patient appears to be doing well with regard to her wound. She has been tolerating the dressing changes without complication. Fortunately there is no signs of active infection at this time. No fevers, chills, nausea, vomiting, or diarrhea. 11/01/2020 upon evaluation today patient with regard to her wound. She has been tolerating the dressing changes without complication. Fortunately there does not appear to be any signs of infection which is great news. No fever chills noted 11/15/2020 upon evaluation today patient appears to be doing well with regard to her wound. Fortunately there is no signs of active infection at this time. No fevers, chills, nausea, vomiting, or diarrhea. With that being said she continues to have a significant amount of pain at the site even though this is very close to complete closure. She also had several varicose veins around the area which were also problematic. Overall however I feel like the patient is making excellent progress. 11/28/2020 upon evaluation today patient appears to be doing well with regard to her leg ulcer.  Again were not really able to debride or compression wrap her due to discomfort and pain. She does not allow for that. With that being said we have been using Iodosorb which does seem to be doing decently well. Fortunately there is no signs of active infection at this time which is great news. No fevers, chills, nausea, vomiting, or diarrhea. 8/31; patient presents for 2-week follow-up. She has been using Iodosorb. She reports that the wound is closed. She denies signs of infection. Readmission: 10-27-2021 upon evaluation this is a patient that presents today whom I have previously seen this is pretty much for the same issue though I think a little bit higher than the last time I saw her. She does have a history of chronic venous insufficiency and hypertension along with varicose veins. Subsequently she does have an ulceration which spontaneously ruptured she has not been wearing  any compression which I think is a big part of the issue here. We discussed this before I really think she probably needs to be wearing her compression therapy, she probably needs lymphedema pumps if she can ever wear the compression for a significant amount of time to get these, and subsequently also think that she needs to be elevating her legs is much as possible she may even need some vascular intervention in regard to her veins. All of this was reiterated and discussed with her today to reinforce what needs to happen in order to ensure that her legs do not get a lot worse. The patient voiced understanding. She tells me that she knows because she is seeing her mom go through a lot of this as well how bad things can get. 11-03-2021 upon evaluation today patient appears to be doing well with regard to her wound. Fortunately there does not appear to be any signs of active infection at this time. She is measuring a little bit bigger but I think this is because the wound is actually cleaning up a bit here. 7/27; left lateral  leg wound not any smaller but perhaps with a cleaner surface. She is using Iodoflex to help with the latter and using Tubigrip. She has chronic venous insufficiency with secondary lymphedema. She is apparently followed by vein and vascular and is being scheduled for an ablation Electronic Signature(s) Signed: 11/09/2021 4:27:18 PM By: Linton Ham MD Entered By: Linton Ham on 11/09/2021 11:24:11 Gerlich, [name] ([id number]) -------------------------------------------------------------------------------- Physical Exam Details Patient Name: [name], [name]. Date of Service: 11/09/2021 10:30 AM Medical Record Number: [id number] Patient Account Number: [id number] Date of Birth/Sex: March 20, 1986 (36 y.o. F) Treating RN: Cornell Barman Primary Care Provider: [name] Other Clinician: [name] Referring Provider: [name] Treating Provider/Extender: Ricard Dillon Weeks in Treatment: 1 Constitutional Sitting or standing Blood Pressure is within target range for patient.. Pulse regular and within target range for patient.Marland Kitchen Respirations regular, non- labored and within target range.. Temperature is normal and within the target range for the patient.Marland Kitchen appears in no distress. Cardiovascular Nonpitting edema in the left lower leg. Notes Wound exam; the wound is actually clean but no smaller debris wiped off fairly easily I did not do any mechanical debridement. Her degree of edema control is strictly marginal. There is no evidence of infection Electronic Signature(s) Signed: 11/09/2021 4:27:18 PM By: Linton Ham MD Entered By: Linton Ham on 11/09/2021 11:26:02 Nylen, [name] ([id number]) -------------------------------------------------------------------------------- Physician Orders Details Patient Name: [name], [name]. Date of Service: 11/09/2021 10:30 AM Medical Record Number: [id number] Patient Account Number: [id number] Date of Birth/Sex: January 23, 1986 (36  y.o. F) Treating RN: Cornell Barman Primary Care Provider: [name] Other Clinician: [name] Referring Provider: [name] Treating Provider/Extender: Tito Dine in Treatment: 1 Verbal / Phone Orders: No Diagnosis Coding Follow-up Appointments o Return Appointment in 1 week. Bathing/ Shower/ Hygiene o May shower; gently cleanse wound with antibacterial soap, rinse and pat dry prior to dressing wounds Edema Control - Lymphedema / Segmental Compressive Device / Other o Tubigrip double layer applied - size F o Elevate, Exercise Daily and Avoid Standing for Long Periods of Time. o Elevate legs to the level of the heart and pump ankles as often as possible o Elevate leg(s) parallel to the floor when sitting. Wound Treatment Wound #4 - Lower Leg Wound Laterality: Left, Medial Cleanser: Byram Ancillary Kit - 15 Day Supply (Generic) 3 x Per Week/30 Days Discharge Instructions: Use supplies  as instructed; Kit contains: (15) Saline Bullets; (15) 3x3 Gauze; 15 pr Gloves Cleanser: Soap and Water 3 x Per Week/30 Days Discharge Instructions: Gently cleanse wound with antibacterial soap, rinse and pat dry prior to dressing wounds Primary Dressing: IODOFLEX 0.9% Cadexomer Iodine Pad 3 x Per Week/30 Days Discharge Instructions: Apply Iodoflex to wound bed only as directed. Secondary Dressing: (SILICONE BORDER) Zetuvit Plus SILICONE BORDER Dressing 4x4 (in/in) (Generic) 3 x Per Week/30 Days Discharge Instructions: Please do not put silicone bordered dressings under wraps. Use non-bordered dressing only. Compression Wrap: tubi grip 3 x Per Week/30 Days Discharge Instructions: size F double layer Electronic Signature(s) Signed: 11/09/2021 4:20:19 PM By: [name] Signed: 11/09/2021 4:27:18 PM By: Linton Ham MD Entered By: [name] on 11/09/2021 11:21:35 Peerson, [name]  ([id number]) -------------------------------------------------------------------------------- Problem List Details Patient Name: [name], [name]. Date of Service: 11/09/2021 10:30 AM Medical Record Number: [id number] Patient Account Number: [id number] Date of Birth/Sex: 1986/03/30 (36 y.o. F) Treating RN: Cornell Barman Primary Care Provider: [name] Other Clinician: [name] Referring Provider: [name] Treating Provider/Extender: Tito Dine in Treatment: 1 Active Problems ICD-10 Encounter Code Description Active Date MDM Diagnosis I83.028 Varicose veins of left lower extremity with ulcer other part of lower leg 10/27/2021 No Yes I89.0 Lymphedema, not elsewhere classified 10/27/2021 No Yes L97.822 Non-pressure chronic ulcer of other part of left lower leg with fat layer 10/27/2021 No Yes exposed E66.01 Morbid (severe) obesity due to excess calories 10/27/2021 No Yes Inactive Problems Resolved Problems Electronic Signature(s) Signed: 11/09/2021 4:27:18 PM By: Linton Ham MD Entered By: Linton Ham on 11/09/2021 11:23:16 Cozza, [name] ([id number]) -------------------------------------------------------------------------------- Progress Note Details Patient Name: [name], [name] Date of Service: 11/09/2021 10:30 AM Medical Record Number: [id number] Patient Account Number: [id number] Date of Birth/Sex: 1985-10-03 (36 y.o. F) Treating RN: Cornell Barman Primary Care Provider: [name] Other Clinician: [name] Referring Provider: [name] Treating Provider/Extender: Tito Dine in Treatment: 1 Subjective History of Present Illness (HPI) 36 year old patient who started with having ulcerations on the right lower leg on the lateral part of her ankle for about 2 weeks. She was seen in the ER at [facility] and advised to see the wound care for a consultation. No X-rays of workup was done during the ER visit and no  prescription for any medications of compression wraps were given. the patient is not diabetic but does have hypertension and her medications have been reviewed by me. 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 The right and left greater saphenous vein was found to be tortuous. Deep venous system was also not competent and there was reflux of more than 500 ms. She was then seen by Dr. Sherren Mocha Early who recommended that the patient would not benefit from endovenous ablation and he had recommended vein stripping on the right side and multiple small phlebectomy procedures on the left side. the patient did not follow-up due to social economic reasons. She has not been wearing any compression stockings and has not taken any specific treatment for varicose veins for the last 3 years. 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 We have obtained some appointments to see the vascular surgeons by the end of the  month and the patient would like to follow up with me at my [facility] on Wednesday, June 29. 10/14/2014 -- she could not see me yesterday in [place] and hence has come for a review today. She has a vascular workup to be done this afternoon at [facility]. She is doing fine otherwise. 10/22/2014 -- she was seen by Dr. Curt Jews and he has recommended surgical removal of her right saphenous vein from distal thigh to saphenofemoral junction and stab phlebectomy's of multiple large tributary branches throughout her thigh and calf. This would be done under general anesthesia in the outpatient setting. 10/29/2014 -- she is trying to work on a surgical date and in the meanwhile we have got insurance clearance for Apligraf and we will  start this next week. 7/22 2016 -- she is here for the first application of Apligraf. 11/19/2014 -- she is here for a second application of Apligraf 11/26/2014 -- she has done fine after her last application of Apligraf and is awaiting her surgery which is scheduled for August 31. 12/03/2014 -- she is doing fine and is here for her third application of Apligraf. 12/21/2014 -- She had surgery on 12/15/2014 by Dr.Early who did #1 ligation and stripping of right great saphenous vein from distal thigh to saphenofemoral junction, #2 stab phlebectomy of large tributary varicose veins in the thigh popliteal space and calf. She had an Ace wrap up to her groin and this was removed today and the Unna's boot was also removed. 12/28/2014 -- she is here for her fourth application of Apligraf. 01/06/2015 - he saw her vascular surgeon Dr. Donnetta Hutching who was pleased with her progress and he has confirmed that no surgical procedures could be attempted on the left side. 01/13/2015 -- her wound looks very good and she's been having no problems whatsoever. Readmission: 07/26/2020 upon evaluation today patient presents for initial inspection here in our clinic for a new issue with her left leg although she is previously been seen due to issues with the right leg back in 2016. At that time she was seeing Dr. Donnetta Hutching who is a vein/vascular specialist in [place]. He has since semiretired from what I understand. He is working out of [employer] I believe. Nonetheless she tells me at the time that there was really nothing to do for her left leg although the right leg was where they did most of the work. Subsequently she states that she is done fairly well until just in the past week where she had issues with bleeding from what appears to be varicose vein on the left leg medially. Unfortunately this has continued to be an issue although she tells me at first it was coming much more significantly Down quite a bit but nonetheless  has not completely resolved. Every time she showers she notices that it starts to drain a little bit more. She does have a history of chronic venous insufficiency, lymphedema, varicose veins bilaterally, and obesity. 08/02/2020 upon evaluation today patient appears to be doing about the same in regards to the ulcer on her left leg. She has some eschar covering there is definitely some fluid collecting underneath unfortunately. With that being said she tells me she is still having a tremendous amount of pain therefore she is really not able to allow me to clean this off very effectively to be perfectly honest. I think we need to try to soften this up 08/16/2020 upon evaluation today patient's wound is really not doing significantly better not really states about  the same. She notes that the wrap just does not seem to be staying up very well at all unfortunately. No fevers, chills, nausea, vomiting, or diarrhea. She did cut it off once it starts to slide in order to alleviate some of the pressure from sliding Down. Fortunately there is no signs of active infection at this time which is great news. 08/23/2020 upon evaluation today patient appears to be doing well 08/23/2020 upon evaluation today patient appears to be doing well with regard to her wound all things considered. Fortunately there does not appear to be any signs of active infection at this time which is great news. She has been tolerating the dressing changes without complication and overall I am extremely pleased with where things stand at this point. She does have her appointment with vascular in [facility] on June 9. [name], [name] ([id number]) 08/30/2020 upon evaluation today patient actually appears to be doing decently well in regard to her wound. Fortunately there is no signs of active infection which is great news. Nonetheless I do believe that the patient is going require little bit of debridement if she is okay with me attempting that  today I think that will help clean off some of the necrotic tissue. Fortunately there does not appear to be otherwise any evidence of active infection which is also great news. 09/19/2020 upon evaluation today patient appears to be doing a little better in regard to her wound as compared to previous. Fortunately there does not appear to be any signs of active infection overall. No fever chills noted. I do believe that the Iodosorb has made this a little bit better with regard to the overall size and appearance of the wound bed though again she does still have quite a ways to go to get this to heal she still very tender to touch. 09/27/2020 upon evaluation today patient appears to actually be doing quite well with regard to her wound. This is measuring much smaller which is great news. With that being said she did see vein and vascular in [facility] and they subsequently recommended that surgery is really what she probably needs to go forward with sounds like the potential for venous ablation. With that being said the patient tells me this is just not the right time for her to be able to proceed with any type of surgery which I completely understand. Nonetheless I do believe that she would continue to benefit from compression but again that is really not something that she is able to easily do. 10/04/2020 upon evaluation today patient appears to be doing about the same in regard to her wound. This is measuring a little bit smaller but nonetheless still is open and again has some slough and biofilm noted on the surface of the wound. There does not appear to be any signs of active infection which is great news. No fevers, chills, nausea, vomiting, or diarrhea. 10/04/2020 upon evaluation today patient appears to be doing well with regard to her wound. She has been tolerating dressing changes without complication. Fortunately there does not appear to be any signs of active infection which is great news. No  fevers, chills, nausea, vomiting, or diarrhea. 10/25/2020 upon evaluation today patient appears to be doing well with regard to her wound. She has been tolerating the dressing changes without complication. Fortunately there is no signs of active infection at this time. No fevers, chills, nausea, vomiting, or diarrhea. 11/01/2020 upon evaluation today patient with regard to her wound. She has  been tolerating the dressing changes without complication. Fortunately there does not appear to be any signs of infection which is great news. No fever chills noted 11/15/2020 upon evaluation today patient appears to be doing well with regard to her wound. Fortunately there is no signs of active infection at this time. No fevers, chills, nausea, vomiting, or diarrhea. With that being said she continues to have a significant amount of pain at the site even though this is very close to complete closure. She also had several varicose veins around the area which were also problematic. Overall however I feel like the patient is making excellent progress. 11/28/2020 upon evaluation today patient appears to be doing well with regard to her leg ulcer. Again were not really able to debride or compression wrap her due to discomfort and pain. She does not allow for that. With that being said we have been using Iodosorb which does seem to be doing decently well. Fortunately there is no signs of active infection at this time which is great news. No fevers, chills, nausea, vomiting, or diarrhea. 8/31; patient presents for 2-week follow-up. She has been using Iodosorb. She reports that the wound is closed. She denies signs of infection. Readmission: 10-27-2021 upon evaluation this is a patient that presents today whom I have previously seen this is pretty much for the same issue though I think a little bit higher than the last time I saw her. She does have a history of chronic venous insufficiency and hypertension along with varicose  veins. Subsequently she does have an ulceration which spontaneously ruptured she has not been wearing any compression which I think is a big part of the issue here. We discussed this before I really think she probably needs to be wearing her compression therapy, she probably needs lymphedema pumps if she can ever wear the compression for a significant amount of time to get these, and subsequently also think that she needs to be elevating her legs is much as possible she may even need some vascular intervention in regard to her veins. All of this was reiterated and discussed with her today to reinforce what needs to happen in order to ensure that her legs do not get a lot worse. The patient voiced understanding. She tells me that she knows because she is seeing her mom go through a lot of this as well how bad things can get. 11-03-2021 upon evaluation today patient appears to be doing well with regard to her wound. Fortunately there does not appear to be any signs of active infection at this time. She is measuring a little bit bigger but I think this is because the wound is actually cleaning up a bit here. 7/27; left lateral leg wound not any smaller but perhaps with a cleaner surface. She is using Iodoflex to help with the latter and using Tubigrip. She has chronic venous insufficiency with secondary lymphedema. She is apparently followed by vein and vascular and is being scheduled for an ablation Objective Constitutional Sitting or standing Blood Pressure is within target range for patient.. Pulse regular and within target range for patient.Marland Kitchen Respirations regular, non- labored and within target range.. Temperature is normal and within the target range for the patient.Marland Kitchen appears in no distress. Vitals Time Taken: 10:51 AM, Height: 66 in, Weight: 400 lbs, BMI: 64.6, Temperature: 98.1 F, Pulse: 86 bpm, Respiratory Rate: 16 breaths/min, Blood Pressure: 127/86 mmHg. Cardiovascular Nonpitting edema in  the left lower leg. [name], [name] ([id number]) General Notes: Wound  exam; the wound is actually clean but no smaller debris wiped off fairly easily I did not do any mechanical debridement. Her degree of edema control is strictly marginal. There is no evidence of infection Integumentary (Hair, Skin) Wound #4 status is Open. Original cause of wound was Gradually Appeared. The date acquired was: 10/17/2021. The wound has been in treatment 1 weeks. The wound is located on the Left,Medial Lower Leg. The wound measures 1.6cm length x 1.9cm width x 0.2cm depth; 2.388cm^2 area and 0.478cm^3 volume. There is Fat Layer (Subcutaneous Tissue) exposed. There is a medium amount of serosanguineous drainage noted. There is medium (34-66%) red granulation within the wound bed. There is a medium (34-66%) amount of necrotic tissue within the wound bed including Adherent Slough. 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 Bathing/ Shower/ Hygiene: May shower; gently cleanse wound with antibacterial soap, rinse and pat dry prior to dressing wounds Edema Control - Lymphedema / Segmental Compressive Device / Other: Tubigrip double layer applied - size F Elevate, Exercise Daily and Avoid Standing for Long Periods of Time. Elevate legs to the level of the heart and pump ankles as often as possible Elevate leg(s) parallel to the floor when sitting. WOUND #4: - Lower Leg Wound Laterality: Left, Medial Cleanser: Byram Ancillary Kit - 15 Day Supply (Generic) 3 x Per Week/30 Days Discharge Instructions: Use supplies as instructed; Kit contains: (15) Saline Bullets; (15) 3x3 Gauze; 15 pr Gloves Cleanser: Soap and Water 3 x Per  Week/30 Days Discharge Instructions: Gently cleanse wound with antibacterial soap, rinse and pat dry prior to dressing wounds Primary Dressing: IODOFLEX 0.9% Cadexomer Iodine Pad 3 x Per Week/30 Days Discharge Instructions: Apply Iodoflex to wound bed only as directed. Secondary Dressing: (SILICONE BORDER) Zetuvit Plus SILICONE BORDER Dressing 4x4 (in/in) (Generic) 3 x Per Week/30 Days Discharge Instructions: Please do not put silicone bordered dressings under wraps. Use non-bordered dressing only. Compression Wrap: tubi grip 3 x Per Week/30 Days Discharge Instructions: size F double layer 1. Continue with Iodoflex 2. I talked to her about a compression wrap however she has had these in the past and said they would fall down while she is at work [[employer]]. I therefore doubled up on her Tubigrip which I think she will tolerate and be able to change 3. Advised her to keep her legs elevated when she can 4. She is apparently scheduled for "ablations" with vein and vascular I did not look into this Electronic Signature(s) Signed: 11/09/2021 4:27:18 PM By: Linton Ham MD Entered By: Linton Ham on 11/09/2021 11:43:39 [name], [name] ([id number]) -------------------------------------------------------------------------------- SuperBill Details Patient Name: [name], [name]. Date of Service: 11/09/2021 Medical Record Number: [id number] Patient Account Number: [id number] Date of Birth/Sex: 04-20-1985 (36 y.o. F) Treating RN: Cornell Barman Primary Care Provider: [name] Other Clinician: [name] Referring Provider: [name] Treating Provider/Extender: Tito Dine in Treatment: 1 Diagnosis Coding ICD-10 Codes Code Description I83.028 Varicose veins of left lower extremity with ulcer other part of lower leg I89.0 Lymphedema, not elsewhere classified L97.822 Non-pressure chronic ulcer of other part of left lower leg with fat layer exposed E66.01 Morbid  (severe) obesity due to excess calories Facility Procedures CPT4 Code: 78295621 Description: 99213 - WOUND CARE VISIT-LEV 3 EST  PT Modifier: Quantity: 1 Physician Procedures CPT4 Code: 2481859 Description: 09311 - WC PHYS LEVEL 3 - EST PT Modifier: Quantity: 1 CPT4 Code: Description: ICD-10 Diagnosis Description L97.822 Non-pressure chronic ulcer of other part of left lower leg with fat lay I83.028 Varicose veins of left lower extremity with ulcer other part of lower l I89.0 Lymphedema, not elsewhere classified Modifier: er exposed eg Quantity: Electronic Signature(s) Signed: 11/09/2021 4:27:18 PM By: Linton Ham MD Entered By: Linton Ham on 11/09/2021 11:28:41

## 2021-11-10 NOTE — Progress Notes (Signed)
[name], [name] ([id number]) Visit Report for 11/09/2021 Arrival Information Details Patient Name: [name], [name]. Date of Service: 11/09/2021 10:30 AM Medical Record Number: [id number] Patient Account Number: [id number] Date of Birth/Sex: Apr 16, 1986 (36 y.o. F) Treating RN: Cornell Barman Primary Care Shaylan Tutton: [name] Other Clinician: [name] Referring Quentez Lober: [name] Treating Justinn Welter/Extender: Tito Dine in Treatment: 1 Visit Information History Since Last Visit All ordered tests and consults were completed: No Patient Arrived: Ambulatory Added or deleted any medications: No Arrival Time: 10:50 Any new allergies or adverse reactions: No Transfer Assistance: None Had a fall or experienced change in No Patient Requires Transmission-Based Precautions: No activities of daily living that may affect Patient Has Alerts: No risk of falls: Hospitalized since last visit: No Pain Present Now: Yes Electronic Signature(s) Signed: 11/09/2021 4:20:19 PM By: [name] Entered By: [name] on 11/09/2021 10:50:24 Schuchard, Herminio Heads ([id number]) -------------------------------------------------------------------------------- Clinic Level of Care Assessment Details Patient Name: [name], [name] Date of Service: 11/09/2021 10:30 AM Medical Record Number: [id number] Patient Account Number: [id number] Date of Birth/Sex: 1985-06-27 (36 y.o. F) Treating RN: Cornell Barman Primary Care Chauncey Sciulli: [name] Other Clinician: [name] Referring Luka Stohr: [name] Treating Deloss Amico/Extender: Tito Dine in Treatment: 1 Clinic Level of Care Assessment Items TOOL 4 Quantity Score []  - Use when only an EandM is performed on FOLLOW-UP visit 0 ASSESSMENTS - Nursing Assessment / Reassessment X - Reassessment of Co-morbidities (includes updates in patient status) 1 10 X- 1 5 Reassessment of Adherence to Treatment Plan ASSESSMENTS  - Wound and Skin Assessment / Reassessment X - Simple Wound Assessment / Reassessment - one wound 1 5 []  - 0 Complex Wound Assessment / Reassessment - multiple wounds []  - 0 Dermatologic / Skin Assessment (not related to wound area) ASSESSMENTS - Focused Assessment []  - Circumferential Edema Measurements - multi extremities 0 []  - 0 Nutritional Assessment / Counseling / Intervention []  - 0 Lower Extremity Assessment (monofilament, tuning fork, pulses) []  - 0 Peripheral Arterial Disease Assessment (using hand held doppler) ASSESSMENTS - Ostomy and/or Continence Assessment and Care []  - Incontinence Assessment and Management 0 []  - 0 Ostomy Care Assessment and Management (repouching, etc.) PROCESS - Coordination of Care X - Simple Patient / Family Education for ongoing care 1 15 []  - 0 Complex (extensive) Patient / Family Education for ongoing care []  - 0 Staff obtains Programmer, systems, Records, Test Results / Process Orders []  - 0 Staff telephones HHA, Nursing Homes / Clarify orders / etc []  - 0 Routine Transfer to another Facility (non-emergent condition) []  - 0 Routine Hospital Admission (non-emergent condition) []  - 0 New Admissions / Biomedical engineer / Ordering NPWT, Apligraf, etc. []  - 0 Emergency Hospital Admission (emergent condition) X- 1 10 Simple Discharge Coordination []  - 0 Complex (extensive) Discharge Coordination PROCESS - Special Needs []  - Pediatric / Minor Patient Management 0 []  - 0 Isolation Patient Management []  - 0 Hearing / Language / Visual special needs []  - 0 Assessment of Community assistance (transportation, D/C planning, etc.) []  - 0 Additional assistance / Altered mentation []  - 0 Support Surface(s) Assessment (bed, cushion, seat, etc.) INTERVENTIONS - Wound Cleansing / Measurement Shark, Halley J. ([id number]) X- 1 5 Simple Wound Cleansing - one wound []  - 0 Complex Wound Cleansing - multiple wounds X- 1 5 Wound Imaging  (photographs - any number of wounds) []  - 0 Wound Tracing (instead of photographs) X- 1 5 Simple Wound Measurement - one wound []  - 0 Complex  Wound Measurement - multiple wounds INTERVENTIONS - Wound Dressings []  - Small Wound Dressing one or multiple wounds 0 X- 1 15 Medium Wound Dressing one or multiple wounds []  - 0 Large Wound Dressing one or multiple wounds []  - 0 Application of Medications - topical []  - 0 Application of Medications - injection INTERVENTIONS - Miscellaneous []  - External ear exam 0 []  - 0 Specimen Collection (cultures, biopsies, blood, body fluids, etc.) []  - 0 Specimen(s) / Culture(s) sent or taken to Lab for analysis []  - 0 Patient Transfer (multiple staff / Civil Service fast streamer / Similar devices) []  - 0 Simple Staple / Suture removal (25 or less) []  - 0 Complex Staple / Suture removal (26 or more) []  - 0 Hypo / Hyperglycemic Management (close monitor of Blood Glucose) []  - 0 Ankle / Brachial Index (ABI) - do not check if billed separately X- 1 5 Vital Signs Has the patient been seen at the hospital within the last three years: Yes Total Score: 80 Level Of Care: New/Established - Level 3 Electronic Signature(s) Signed: 11/09/2021 4:20:19 PM By: [name] Entered By: [name] on 11/09/2021 11:22:41 Reasons, Herminio Heads ([id number]) -------------------------------------------------------------------------------- Encounter Discharge Information Details Patient Name: [name], [name] Date of Service: 11/09/2021 10:30 AM Medical Record Number: [id number] Patient Account Number: [id number] Date of Birth/Sex: 07-05-1985 (36 y.o. F) Treating RN: Cornell Barman Primary Care Arnisha Laffoon: [name] Other Clinician: [name] Referring Mckenzie Bove: [name] Treating Azizah Lisle/Extender: Tito Dine in Treatment: 1 Encounter Discharge Information Items Discharge Condition: Stable Ambulatory Status: Ambulatory Discharge Destination:  Home Transportation: Private Auto Accompanied By: self Schedule Follow-up Appointment: Yes Clinical Summary of Care: Electronic Signature(s) Signed: 11/09/2021 4:20:19 PM By: [name] Entered By: [name] on 11/09/2021 11:30:18 Pereira, Herminio Heads ([id number]) -------------------------------------------------------------------------------- Lower Extremity Assessment Details Patient Name: SHARLEEN, SZCZESNY. Date of Service: 11/09/2021 10:30 AM Medical Record Number: [id number] Patient Account Number: [id number] Date of Birth/Sex: 01-24-1986 (36 y.o. F) Treating RN: Cornell Barman Primary Care Daesia Zylka: [name] Other Clinician: [name] Referring Anushka Hartinger: [name] Treating Jadaya Sommerfield/Extender: Tito Dine in Treatment: 1 Edema Assessment Assessed: [Left: Yes] [Right: No] Edema: [Left: Ye] [Right: s] Calf Left: Right: Point of Measurement: 31 cm From Medial Instep 54.1 cm Ankle Left: Right: Point of Measurement: 10 cm From Medial Instep 32.5 cm Vascular Assessment Pulses: Dorsalis Pedis Palpable: [Left:Yes] Electronic Signature(s) Signed: 11/09/2021 4:20:19 PM By: [name] Signed: 11/10/2021 3:58:02 PM By: Gretta Cool, [name], RN, CWS, [name], [name] Entered By: [name] on 11/09/2021 11:07:35 Sky, Herminio Heads ([id number]) -------------------------------------------------------------------------------- Multi Wound Chart Details Patient Name: JEANNI, ALLSHOUSE. Date of Service: 11/09/2021 10:30 AM Medical Record Number: [id number] Patient Account Number: [id number] Date of Birth/Sex: 03/29/1986 (36 y.o. F) Treating RN: Cornell Barman Primary Care Rylend Pietrzak: [name] Other Clinician: [name] Referring Narmeen Kerper: [name] Treating Matyas Baisley/Extender: Tito Dine in Treatment: 1 Vital Signs Height(in): 66 Pulse(bpm): 86 Weight(lbs): 400 Blood Pressure(mmHg): 127/86 Body Mass Index(BMI): 64.6 Temperature(F):  98.1 Respiratory Rate(breaths/min): 16 Photos: [N/A:N/A] Wound Location: Left, Medial Lower Leg N/A N/A Wounding Event: Gradually Appeared N/A N/A Primary Etiology: Venous Leg Ulcer N/A N/A Comorbid History: Hypertension, Peripheral Venous N/A N/A Disease Date Acquired: 10/17/2021 N/A N/A Weeks of Treatment: 1 N/A N/A Wound Status: Open N/A N/A Wound Recurrence: No N/A N/A Measurements L x W x D (cm) 1.6x1.9x0.2 N/A N/A Area (cm) : 2.388 N/A N/A Volume (cm) : 0.478 N/A N/A % Reduction in Area: -334.20% N/A N/A % Reduction in  Volume: -769.10% N/A N/A Classification: Full Thickness Without Exposed N/A N/A Support Structures Exudate Amount: Medium N/A N/A Exudate Type: Serosanguineous N/A N/A Exudate Color: red, brown N/A N/A Granulation Amount: Medium (34-66%) N/A N/A Granulation Quality: Red N/A N/A Necrotic Amount: Medium (34-66%) N/A N/A Exposed Structures: Fat Layer (Subcutaneous Tissue): N/A N/A Yes Fascia: No Tendon: No Muscle: No Joint: No Bone: No Epithelialization: None N/A N/A Treatment Notes Electronic Signature(s) Signed: 11/09/2021 4:20:19 PM By: [name] Entered By: [name] on 11/09/2021 11:08:04 Causer, Herminio Heads ([id number]) -------------------------------------------------------------------------------- [place] Details Patient Name: KYREN, VAUX. Date of Service: 11/09/2021 10:30 AM Medical Record Number: [id number] Patient Account Number: [id number] Date of Birth/Sex: 1986/01/23 (36 y.o. F) Treating RN: Cornell Barman Primary Care Lenice Koper: [name] Other Clinician: [name] Referring Achsah Mcquade: [name] Treating Damain Broadus/Extender: Tito Dine in Treatment: 1 Active Inactive Wound/Skin Impairment Nursing Diagnoses: Knowledge deficit related to ulceration/compromised skin integrity Goals: Patient/caregiver will verbalize understanding of skin care regimen Date Initiated:  10/27/2021 Target Resolution Date: 11/27/2021 Goal Status: Active Ulcer/skin breakdown will have a volume reduction of 30% by week 4 Date Initiated: 10/27/2021 Target Resolution Date: 12/28/2021 Goal Status: Active Ulcer/skin breakdown will have a volume reduction of 50% by week 8 Date Initiated: 10/27/2021 Target Resolution Date: 01/27/2022 Goal Status: Active Ulcer/skin breakdown will have a volume reduction of 80% by week 12 Date Initiated: 10/27/2021 Target Resolution Date: 02/27/2022 Goal Status: Active Ulcer/skin breakdown will heal within 14 weeks Date Initiated: 10/27/2021 Target Resolution Date: 03/29/2022 Goal Status: Active Interventions: Assess patient/caregiver ability to obtain necessary supplies Assess patient/caregiver ability to perform ulcer/skin care regimen upon admission and as needed Assess ulceration(s) every visit Notes: Electronic Signature(s) Signed: 11/09/2021 4:20:19 PM By: [name] Signed: 11/10/2021 3:58:02 PM By: Gretta Cool, [name], RN, CWS, [name], [name] Entered By: [name] on 11/09/2021 11:07:57 Jakubowicz, Herminio Heads ([id number]) -------------------------------------------------------------------------------- Pain Assessment Details Patient Name: LANDI, BISCARDI. Date of Service: 11/09/2021 10:30 AM Medical Record Number: [id number] Patient Account Number: [id number] Date of Birth/Sex: [birth year] (36 y.o. F) Treating RN: Cornell Barman Primary Care Teleshia Lemere: [name] Other Clinician: [name] Referring Katalia Choma: [name] Treating Nomi Rudnicki/Extender: Tito Dine in Treatment: 1 Active Problems Location of Pain Severity and Description of Pain Patient Has Paino Yes Site Locations Pain Location: Pain in Ulcers With Dressing Change: Yes Duration of the Pain. Constant / Intermittento Intermittent Rate the pain. Current Pain Level: 7 Character of Pain Describe the Pain: Aching, Burning Pain Management and  Medication Current Pain Management: Rest: Yes Notes worse with dressing changes Electronic Signature(s) Signed: 11/09/2021 4:20:19 PM By: [name] Signed: 11/10/2021 3:58:02 PM By: Gretta Cool, [name], RN, CWS, [name], [name] Entered By: [name] on 11/09/2021 10:53:41 Kastens, Herminio Heads ([id number]) -------------------------------------------------------------------------------- Patient/Caregiver Education Details Patient Name: DEMI, TRIEU. Date of Service: 11/09/2021 10:30 AM Medical Record Number: [id number] Patient Account Number: [id number] Date of Birth/Gender: May 01, 1985 (36 y.o. F) Treating RN: Cornell Barman Primary Care Physician: [name] Other Clinician: [name] Referring Physician: [name] Treating Physician/Extender: Tito Dine in Treatment: 1 Education Assessment Education Provided To: Patient Education Topics Provided Venous: Handouts: Controlling Swelling with Compression Stockings Electronic Signature(s) Signed: 11/09/2021 4:20:19 PM By: [name] Entered By: [name] on 11/09/2021 11:23:10 Morden, Herminio Heads ([id number]) -------------------------------------------------------------------------------- Wound Assessment Details Patient Name: ADAJA, WANDER. Date of Service: 11/09/2021 10:30 AM Medical Record Number: [id number] Patient Account Number: [id number] Date of Birth/Sex: Nov 22, 1985 (36 y.o. F) Treating RN: Cornell Barman Primary  Care Sadira Standard: [name] Other Clinician: [name] Referring Helaina Stefano: [name] Treating Yamilka Lopiccolo/Extender: Tito Dine in Treatment: 1 Wound Status Wound Number: 4 Primary Etiology: Venous Leg Ulcer Wound Location: Left, Medial Lower Leg Wound Status: Open Wounding Event: Gradually Appeared Comorbid History: Hypertension, Peripheral Venous Disease Date Acquired: 10/17/2021 Weeks Of Treatment: 1 Clustered Wound: No Photos Wound Measurements Length:  (cm) 1.6 Width: (cm) 1.9 Depth: (cm) 0.2 Area: (cm) 2.388 Volume: (cm) 0.478 % Reduction in Area: -334.2% % Reduction in Volume: -769.1% Epithelialization: None Wound Description Classification: Full Thickness Without Exposed Support Structures Exudate Amount: Medium Exudate Type: Serosanguineous Exudate Color: red, brown Foul Odor After Cleansing: No Slough/Fibrino No Wound Bed Granulation Amount: Medium (34-66%) Exposed Structure Granulation Quality: Red Fascia Exposed: No Necrotic Amount: Medium (34-66%) Fat Layer (Subcutaneous Tissue) Exposed: Yes Necrotic Quality: Adherent Slough Tendon Exposed: No Muscle Exposed: No Joint Exposed: No Bone Exposed: No Treatment Notes Wound #4 (Lower Leg) Wound Laterality: Left, Medial Cleanser Byram Ancillary Kit - 15 Day Supply Discharge Instruction: Use supplies as instructed; Kit contains: (15) Saline Bullets; (15) 3x3 Gauze; 15 pr Gloves Soap and Water Discharge Instruction: Gently cleanse wound with antibacterial soap, rinse and pat dry prior to dressing wounds Houseman, Jacobi J. ([id number]) Peri-Wound Care Topical Primary Dressing IODOFLEX 0.9% Cadexomer Iodine Pad Discharge Instruction: Apply Iodoflex to wound bed only as directed. Secondary Dressing (SILICONE BORDER) Zetuvit Plus SILICONE BORDER Dressing 4x4 (in/in) Discharge Instruction: Please do not put silicone bordered dressings under wraps. Use non-bordered dressing only. Secured With Compression Wrap tubi grip Discharge Instruction: size F double layer Compression Stockings Add-Ons Electronic Signature(s) Signed: 11/09/2021 4:20:19 PM By: [name] Signed: 11/10/2021 3:58:02 PM By: Gretta Cool, [name], RN, CWS, [name], [name] Entered By: [name] on 11/09/2021 11:05:21 Cordle, Herminio Heads ([id number]) -------------------------------------------------------------------------------- Vitals Details Patient Name: FLOELLA, ENSZ. Date of Service: 11/09/2021 10:30  AM Medical Record Number: [id number] Patient Account Number: [id number] Date of Birth/Sex: [birth year] (36 y.o. F) Treating RN: Cornell Barman Primary Care Rebel Laughridge: [name] Other Clinician: [name] Referring Brandilee Pies: [name] Treating Elyjah Hazan/Extender: Tito Dine in Treatment: 1 Vital Signs Time Taken: 10:51 Temperature (F): 98.1 Height (in): 66 Pulse (bpm): 86 Weight (lbs): 400 Respiratory Rate (breaths/min): 16 Body Mass Index (BMI): 64.6 Blood Pressure (mmHg): 127/86 Reference Range: 80 - 120 mg / dl Electronic Signature(s) Signed: 11/09/2021 4:20:19 PM By: [name] Entered By: [name] on 11/09/2021 10:53:37

## 2021-11-16 ENCOUNTER — Telehealth: Payer: Self-pay | Admitting: Surgery

## 2021-11-16 NOTE — Telephone Encounter (Signed)
called pt. to schudule surgery. pt states she wants ulcer on left Leg to heal prior to scheduling procedure.

## 2021-11-17 ENCOUNTER — Encounter: Payer: BC Managed Care – PPO | Attending: Physician Assistant | Admitting: Physician Assistant

## 2021-11-17 DIAGNOSIS — I890 Lymphedema, not elsewhere classified: Secondary | ICD-10-CM | POA: Insufficient documentation

## 2021-11-17 DIAGNOSIS — Z6841 Body Mass Index (BMI) 40.0 and over, adult: Secondary | ICD-10-CM | POA: Insufficient documentation

## 2021-11-17 DIAGNOSIS — L97822 Non-pressure chronic ulcer of other part of left lower leg with fat layer exposed: Secondary | ICD-10-CM | POA: Insufficient documentation

## 2021-11-17 DIAGNOSIS — I10 Essential (primary) hypertension: Secondary | ICD-10-CM | POA: Insufficient documentation

## 2021-11-17 DIAGNOSIS — I83028 Varicose veins of left lower extremity with ulcer other part of lower leg: Secondary | ICD-10-CM | POA: Insufficient documentation

## 2021-11-17 DIAGNOSIS — I872 Venous insufficiency (chronic) (peripheral): Secondary | ICD-10-CM | POA: Diagnosis not present

## 2021-11-17 NOTE — Progress Notes (Addendum)
BELLADONNA, LUBINSKI ([id number]) Visit Report for 11/17/2021 Chief Complaint Document Details Patient Name: [name], [name]. Date of Service: 11/17/2021 2:00 PM Medical Record Number: [id number] Patient Account Number: [id number] Date of Birth/Sex: [birth year] (36 y.o. F) Treating RN: Carlene Coria Primary Care Provider: [name] Other Clinician: Referring Provider: [name] Treating Provider/Extender: Skipper Cliche in Treatment: 3 Information Obtained from: Patient Chief Complaint Left LE Ulcer Electronic Signature(s) Signed: 11/17/2021 2:06:45 PM By: Worthy Keeler PA-C Entered By: Worthy Keeler on 11/17/2021 14:06:44 [place], [name] ([id number]) -------------------------------------------------------------------------------- HPI Details Patient Name: [name] Date of Service: 11/17/2021 2:00 PM Medical Record Number: [id number] Patient Account Number: [id number] Date of Birth/Sex: [birth year] (36 y.o. F) Treating RN: Carlene Coria Primary Care Provider: [name] Other Clinician: Referring Provider: [name] Treating Provider/Extender: Skipper Cliche in Treatment: 3 History of Present Illness HPI Description: 36 year old patient who started with having ulcerations on the right lower leg on the lateral part of her ankle for about 2 weeks. She was seen in the ER at [facility] and advised to see the wound care for a consultation. No X-rays of workup was done during the ER visit and no prescription for any medications of compression wraps were given. the patient is not diabetic but does have hypertension and her medications have been reviewed by me. 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 The right and left greater saphenous vein was found to be tortuous. Deep venous system was  also not competent and there was reflux of more than 500 ms. She was then seen by Dr. Sherren Mocha Early who recommended that the patient would not benefit from endovenous ablation and he had recommended vein stripping on the right side and multiple small phlebectomy procedures on the left side. the patient did not follow-up due to social economic reasons. She has not been wearing any compression stockings and has not taken any specific treatment for varicose veins for the last 3 years. 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 We have obtained some appointments to see the vascular surgeons by the end of the month and the patient would like to follow up with me at my [facility] on Wednesday, June 29. 10/14/2014 -- she could not see me yesterday in [place] and hence has come for a review today. She has a vascular workup to be done this afternoon at [facility]. She is doing fine otherwise. 10/22/2014 -- she was seen by Dr. Curt Jews and he has recommended surgical removal of her right saphenous vein from distal thigh to saphenofemoral junction and stab phlebectomy's of multiple large tributary branches throughout her thigh and calf. This would be done under general anesthesia in the outpatient setting. 10/29/2014 -- she is trying to work on a surgical date and in the meanwhile we have got insurance clearance for Apligraf and we will start this next week. 7/22 2016 -- she is here for the first application of Apligraf. 11/19/2014 -- she is here for a second application of Apligraf 11/26/2014 -- she has done fine after her last application of Apligraf and is awaiting her surgery which is scheduled for August 31. 12/03/2014 -- she is doing fine and is here for her third application  of Apligraf. 12/21/2014 -- She had surgery on 12/15/2014 by Dr.Early who did #1 ligation and stripping of right great  saphenous vein from distal thigh to saphenofemoral junction, #2 stab phlebectomy of large tributary varicose veins in the thigh popliteal space and calf. She had an Ace wrap up to her groin and this was removed today and the Unna's boot was also removed. 12/28/2014 -- she is here for her fourth application of Apligraf. 01/06/2015 - he saw her vascular surgeon Dr. Donnetta Hutching who was pleased with her progress and he has confirmed that no surgical procedures could be attempted on the left side. 01/13/2015 -- her wound looks very good and she's been having no problems whatsoever. Readmission: 07/26/2020 upon evaluation today patient presents for initial inspection here in our clinic for a new issue with her left leg although she is previously been seen due to issues with the right leg back in 2016. At that time she was seeing Dr. Donnetta Hutching who is a vein/vascular specialist in [place]. He has since semiretired from what I understand. He is working out of [employer] I believe. Nonetheless she tells me at the time that there was really nothing to do for her left leg although the right leg was where they did most of the work. Subsequently she states that she is done fairly well until just in the past week where she had issues with bleeding from what appears to be varicose vein on the left leg medially. Unfortunately this has continued to be an issue although she tells me at first it was coming much more significantly Down quite a bit but nonetheless has not completely resolved. Every time she showers she notices that it starts to drain a little bit more. She does have a history of chronic venous insufficiency, lymphedema, varicose veins bilaterally, and obesity. 08/02/2020 upon evaluation today patient appears to be doing about the same in regards to the ulcer on her left leg. She has some eschar covering there is definitely some fluid collecting underneath unfortunately. With that being said she tells me she is  still having a tremendous amount of pain therefore she is really not able to allow me to clean this off very effectively to be perfectly honest. I think we need to try to soften this up 08/16/2020 upon evaluation today patient's wound is really not doing significantly better not really states about the same. She notes that the wrap just does not seem to be staying up very well at all unfortunately. No fevers, chills, nausea, vomiting, or diarrhea. She did cut it off once it starts to slide in order to alleviate some of the pressure from sliding Down. Fortunately there is no signs of active infection at this time which is great news. 08/23/2020 upon evaluation today patient appears to be doing well 08/23/2020 upon evaluation today patient appears to be doing well with regard to her wound all things considered. Fortunately there does not appear to be any signs of active infection at this time which is great news. She has been tolerating the dressing changes without complication and overall I am extremely pleased with where things stand at this point. She does have her appointment with vascular in [facility] on June 9. [name], [name] ([id number]) 08/30/2020 upon evaluation today patient actually appears to be doing decently well in regard to her wound. Fortunately there is no signs of active infection which is great news. Nonetheless I do believe that the patient is going require little bit of debridement if  she is okay with me attempting that today I think that will help clean off some of the necrotic tissue. Fortunately there does not appear to be otherwise any evidence of active infection which is also great news. 09/19/2020 upon evaluation today patient appears to be doing a little better in regard to her wound as compared to previous. Fortunately there does not appear to be any signs of active infection overall. No fever chills noted. I do believe that the Iodosorb has made this a little bit  better with regard to the overall size and appearance of the wound bed though again she does still have quite a ways to go to get this to heal she still very tender to touch. 09/27/2020 upon evaluation today patient appears to actually be doing quite well with regard to her wound. This is measuring much smaller which is great news. With that being said she did see vein and vascular in [facility] and they subsequently recommended that surgery is really what she probably needs to go forward with sounds like the potential for venous ablation. With that being said the patient tells me this is just not the right time for her to be able to proceed with any type of surgery which I completely understand. Nonetheless I do believe that she would continue to benefit from compression but again that is really not something that she is able to easily do. 10/04/2020 upon evaluation today patient appears to be doing about the same in regard to her wound. This is measuring a little bit smaller but nonetheless still is open and again has some slough and biofilm noted on the surface of the wound. There does not appear to be any signs of active infection which is great news. No fevers, chills, nausea, vomiting, or diarrhea. 10/04/2020 upon evaluation today patient appears to be doing well with regard to her wound. She has been tolerating dressing changes without complication. Fortunately there does not appear to be any signs of active infection which is great news. No fevers, chills, nausea, vomiting, or diarrhea. 10/25/2020 upon evaluation today patient appears to be doing well with regard to her wound. She has been tolerating the dressing changes without complication. Fortunately there is no signs of active infection at this time. No fevers, chills, nausea, vomiting, or diarrhea. 11/01/2020 upon evaluation today patient with regard to her wound. She has been tolerating the dressing changes without complication. Fortunately  there does not appear to be any signs of infection which is great news. No fever chills noted 11/15/2020 upon evaluation today patient appears to be doing well with regard to her wound. Fortunately there is no signs of active infection at this time. No fevers, chills, nausea, vomiting, or diarrhea. With that being said she continues to have a significant amount of pain at the site even though this is very close to complete closure. She also had several varicose veins around the area which were also problematic. Overall however I feel like the patient is making excellent progress. 11/28/2020 upon evaluation today patient appears to be doing well with regard to her leg ulcer. Again were not really able to debride or compression wrap her due to discomfort and pain. She does not allow for that. With that being said we have been using Iodosorb which does seem to be doing decently well. Fortunately there is no signs of active infection at this time which is great news. No fevers, chills, nausea, vomiting, or diarrhea. 8/31; patient presents for 2-week follow-up. She  has been using Iodosorb. She reports that the wound is closed. She denies signs of infection. Readmission: 10-27-2021 upon evaluation this is a patient that presents today whom I have previously seen this is pretty much for the same issue though I think a little bit higher than the last time I saw her. She does have a history of chronic venous insufficiency and hypertension along with varicose veins. Subsequently she does have an ulceration which spontaneously ruptured she has not been wearing any compression which I think is a big part of the issue here. We discussed this before I really think she probably needs to be wearing her compression therapy, she probably needs lymphedema pumps if she can ever wear the compression for a significant amount of time to get these, and subsequently also think that she needs to be elevating her legs is much as  possible she may even need some vascular intervention in regard to her veins. All of this was reiterated and discussed with her today to reinforce what needs to happen in order to ensure that her legs do not get a lot worse. The patient voiced understanding. She tells me that she knows because she is seeing her mom go through a lot of this as well how bad things can get. 11-03-2021 upon evaluation today patient appears to be doing well with regard to her wound. Fortunately there does not appear to be any signs of active infection at this time. She is measuring a little bit bigger but I think this is because the wound is actually cleaning up a bit here. 7/27; left lateral leg wound not any smaller but perhaps with a cleaner surface. She is using Iodoflex to help with the latter and using Tubigrip. She has chronic venous insufficiency with secondary lymphedema. She is apparently followed by vein and vascular and is being scheduled for an ablation 11-17-2021 upon evaluation today patient appears to be doing well with regard to her wound this is actually showing signs of improvement which is great news. Fortunately I do not see any evidence of active infection locally or systemically at this time which is great news. No fevers, chills, nausea, vomiting, or diarrhea. Electronic Signature(s) Signed: 11/17/2021 2:48:43 PM By: Worthy Keeler PA-C Entered By: Worthy Keeler on 11/17/2021 14:48:43 [name], [name] ([id number]) -------------------------------------------------------------------------------- Physical Exam Details Patient Name: [name], [name]. Date of Service: 11/17/2021 2:00 PM Medical Record Number: [id number] Patient Account Number: [id number] Date of Birth/Sex: 1986-01-15 (37 y.o. F) Treating RN: Carlene Coria Primary Care Provider: [name] Other Clinician: Referring Provider: [name] Treating Provider/Extender: Skipper Cliche in Treatment: 3 Constitutional Obese and  well-hydrated in no acute distress. Respiratory normal breathing without difficulty. Psychiatric this patient is able to make decisions and demonstrates good insight into disease process. Alert and Oriented x 3. pleasant and cooperative. Notes Upon evaluation patient's wound actually showed some signs of improvement the slough is starting to lift off and the wound looks better. I did perform some light debridement just using saline and gauze no sharp debridement performed. This is due to the pain she is having. The patient tolerated this okay although it did hurt her quite badly even so. I was trying to be as careful as possible. Electronic Signature(s) Signed: 11/17/2021 2:50:18 PM By: Worthy Keeler PA-C Entered By: Worthy Keeler on 11/17/2021 14:50:18 [name], [name] ([id number]) -------------------------------------------------------------------------------- Physician Orders Details Patient Name: [name], [name] Date of Service: 11/17/2021 2:00 PM Medical Record Number: [id number] Patient  Account Number: [id number] Date of Birth/Sex: 09-Oct-1985 (35 y.o. F) Treating RN: Carlene Coria Primary Care Provider: [name] Other Clinician: Referring Provider: [name] Treating Provider/Extender: Skipper Cliche in Treatment: 3 Verbal / Phone Orders: No Diagnosis Coding ICD-10 Coding Code Description I83.028 Varicose veins of left lower extremity with ulcer other part of lower leg I89.0 Lymphedema, not elsewhere classified L97.822 Non-pressure chronic ulcer of other part of left lower leg with fat layer exposed E66.01 Morbid (severe) obesity due to excess calories Follow-up Appointments o Return Appointment in 1 week. Bathing/ Shower/ Hygiene o May shower; gently cleanse wound with antibacterial soap, rinse and pat dry prior to dressing wounds Edema Control - Lymphedema / Segmental Compressive Device / Other o Tubigrip double layer applied - size E left leg o  Elevate, Exercise Daily and Avoid Standing for Long Periods of Time. o Elevate legs to the level of the heart and pump ankles as often as possible o Elevate leg(s) parallel to the floor when sitting. Wound Treatment Wound #4 - Lower Leg Wound Laterality: Left, Medial Cleanser: Byram Ancillary Kit - 15 Day Supply (Generic) 3 x Per Week/30 Days Discharge Instructions: Use supplies as instructed; Kit contains: (15) Saline Bullets; (15) 3x3 Gauze; 15 pr Gloves Cleanser: Soap and Water 3 x Per Week/30 Days Discharge Instructions: Gently cleanse wound with antibacterial soap, rinse and pat dry prior to dressing wounds Primary Dressing: IODOFLEX 0.9% Cadexomer Iodine Pad 3 x Per Week/30 Days Discharge Instructions: Apply Iodoflex to wound bed only as directed. Secondary Dressing: (SILICONE BORDER) Zetuvit Plus SILICONE BORDER Dressing 4x4 (in/in) (Generic) 3 x Per Week/30 Days Discharge Instructions: Please do not put silicone bordered dressings under wraps. Use non-bordered dressing only. Compression Wrap: tubi grip 3 x Per Week/30 Days Discharge Instructions: size E double layer Electronic Signature(s) Signed: 11/17/2021 3:54:34 PM By: Worthy Keeler PA-C Signed: 11/20/2021 8:34:39 AM By: Carlene Coria RN Entered By: Carlene Coria on 11/17/2021 14:53:58 [name], [name] ([id number]) -------------------------------------------------------------------------------- Problem List Details Patient Name: LATTIE, RIEGE. Date of Service: 11/17/2021 2:00 PM Medical Record Number: [id number] Patient Account Number: [id number] Date of Birth/Sex: [birth year] (36 y.o. 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 F) Treating RN: Carlene Coria Primary Care Provider: [name] Other Clinician: Referring Provider: [name] Treating Provider/Extender: Skipper Cliche in Treatment: 3 Subjective Chief Complaint Information obtained from Patient Left LE Ulcer History of Present Illness (HPI) 36 year old patient who started with having ulcerations on the right lower leg on the lateral part of her ankle for about 2 weeks. She was seen in the ER at [facility] and advised to see the wound care for a consultation. No X-rays of workup was done during the  ER visit and no prescription for any medications of compression wraps were given. the patient is not diabetic but does have hypertension and her medications have been reviewed by me. 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 The right and left greater saphenous vein was found to be tortuous. Deep venous system was also not competent and there was reflux of more than 500 ms. She was then seen by Dr. Sherren Mocha Early who recommended that the patient would not  benefit from endovenous ablation and he had recommended vein stripping on the right side and multiple small phlebectomy procedures on the left side. the patient did not follow-up due to social economic reasons. She has not been wearing any compression stockings and has not taken any specific treatment for varicose veins for the last 3 years. 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 We have obtained some appointments to see the vascular surgeons by the end of the month and the patient would like to follow up with me at my [facility] on Wednesday, June 29. 10/14/2014 -- she could not see me yesterday in [place] and hence has come for a review today. She has a vascular workup to be done this afternoon at [facility]. She is doing fine otherwise. 10/22/2014 -- she was seen by Dr. Curt Jews and he has recommended surgical removal of her right saphenous vein from distal thigh to saphenofemoral junction and stab phlebectomy's of multiple large tributary branches throughout her thigh and calf. This would be done under general anesthesia in the outpatient setting. 10/29/2014 -- she is trying to work on a surgical date and in the meanwhile we have got insurance clearance for Apligraf and we will start this next week. 7/22 2016 -- she is here for the first application of Apligraf. 11/19/2014 -- she is here for a second application of Apligraf 11/26/2014 -- she has done fine after her last application of Apligraf and is awaiting her surgery which is scheduled for August 31. 12/03/2014 -- she is doing fine and is here for her third application of Apligraf. 12/21/2014 -- She had surgery on 12/15/2014 by Dr.Early who did #1 ligation and stripping of right great saphenous vein from distal thigh to saphenofemoral junction, #2 stab phlebectomy of large tributary varicose veins in the thigh popliteal  space and calf. She had an Ace wrap up to her groin and this was removed today and the Unna's boot was also removed. 12/28/2014 -- she is here for her fourth application of Apligraf. 01/06/2015 - he saw her vascular surgeon Dr. Donnetta Hutching who was pleased with her progress and he has confirmed that no surgical procedures could be attempted on the left side. 01/13/2015 -- her wound looks very good and she's been having no problems whatsoever. Readmission: 07/26/2020 upon evaluation today patient presents for initial inspection here in our clinic for a new issue with her left leg although she is previously been seen due to issues with the right leg back in 2016. At that time she was seeing Dr. Donnetta Hutching who is a vein/vascular specialist in [place]. He has since semiretired from what I understand. He is working out of [employer] I believe. Nonetheless she tells me at the time that there was really  nothing to do for her left leg although the right leg was where they did most of the work. Subsequently she states that she is done fairly well until just in the past week where she had issues with bleeding from what appears to be varicose vein on the left leg medially. Unfortunately this has continued to be an issue although she tells me at first it was coming much more significantly Down quite a bit but nonetheless has not completely resolved. Every time she showers she notices that it starts to drain a little bit more. She does have a history of chronic venous insufficiency, lymphedema, varicose veins bilaterally, and obesity. 08/02/2020 upon evaluation today patient appears to be doing about the same in regards to the ulcer on her left leg. She has some eschar covering there is definitely some fluid collecting underneath unfortunately. With that being said she tells me she is still having a tremendous amount of pain therefore she is really not able to allow me to clean this off very effectively to be perfectly  honest. I think we need to try to soften this up 08/16/2020 upon evaluation today patient's wound is really not doing significantly better not really states about the same. She notes that the wrap just does not seem to be staying up very well at all unfortunately. No fevers, chills, nausea, vomiting, or diarrhea. She did cut it off once it starts to slide in order to alleviate some of the pressure from sliding Down. Fortunately there is no signs of active infection at this time which is great news. [name], [name] ([id number]) 08/23/2020 upon evaluation today patient appears to be doing well 08/23/2020 upon evaluation today patient appears to be doing well with regard to her wound all things considered. Fortunately there does not appear to be any signs of active infection at this time which is great news. She has been tolerating the dressing changes without complication and overall I am extremely pleased with where things stand at this point. She does have her appointment with vascular in [facility] on June 9. 08/30/2020 upon evaluation today patient actually appears to be doing decently well in regard to her wound. Fortunately there is no signs of active infection which is great news. Nonetheless I do believe that the patient is going require little bit of debridement if she is okay with me attempting that today I think that will help clean off some of the necrotic tissue. Fortunately there does not appear to be otherwise any evidence of active infection which is also great news. 09/19/2020 upon evaluation today patient appears to be doing a little better in regard to her wound as compared to previous. Fortunately there does not appear to be any signs of active infection overall. No fever chills noted. I do believe that the Iodosorb has made this a little bit better with regard to the overall size and appearance of the wound bed though again she does still have quite a ways to go to get this to heal she  still very tender to touch. 09/27/2020 upon evaluation today patient appears to actually be doing quite well with regard to her wound. This is measuring much smaller which is great news. With that being said she did see vein and vascular in [facility] and they subsequently recommended that surgery is really what she probably needs to go forward with sounds like the potential for venous ablation. With that being said the patient tells me this is just not the right time  for her to be able to proceed with any type of surgery which I completely understand. Nonetheless I do believe that she would continue to benefit from compression but again that is really not something that she is able to easily do. 10/04/2020 upon evaluation today patient appears to be doing about the same in regard to her wound. This is measuring a little bit smaller but nonetheless still is open and again has some slough and biofilm noted on the surface of the wound. There does not appear to be any signs of active infection which is great news. No fevers, chills, nausea, vomiting, or diarrhea. 10/04/2020 upon evaluation today patient appears to be doing well with regard to her wound. She has been tolerating dressing changes without complication. Fortunately there does not appear to be any signs of active infection which is great news. No fevers, chills, nausea, vomiting, or diarrhea. 10/25/2020 upon evaluation today patient appears to be doing well with regard to her wound. She has been tolerating the dressing changes without complication. Fortunately there is no signs of active infection at this time. No fevers, chills, nausea, vomiting, or diarrhea. 11/01/2020 upon evaluation today patient with regard to her wound. She has been tolerating the dressing changes without complication. Fortunately there does not appear to be any signs of infection which is great news. No fever chills noted 11/15/2020 upon evaluation today patient appears to be  doing well with regard to her wound. Fortunately there is no signs of active infection at this time. No fevers, chills, nausea, vomiting, or diarrhea. With that being said she continues to have a significant amount of pain at the site even though this is very close to complete closure. She also had several varicose veins around the area which were also problematic. Overall however I feel like the patient is making excellent progress. 11/28/2020 upon evaluation today patient appears to be doing well with regard to her leg ulcer. Again were not really able to debride or compression wrap her due to discomfort and pain. She does not allow for that. With that being said we have been using Iodosorb which does seem to be doing decently well. Fortunately there is no signs of active infection at this time which is great news. No fevers, chills, nausea, vomiting, or diarrhea. 8/31; patient presents for 2-week follow-up. She has been using Iodosorb. She reports that the wound is closed. She denies signs of infection. Readmission: 10-27-2021 upon evaluation this is a patient that presents today whom I have previously seen this is pretty much for the same issue though I think a little bit higher than the last time I saw her. She does have a history of chronic venous insufficiency and hypertension along with varicose veins. Subsequently she does have an ulceration which spontaneously ruptured she has not been wearing any compression which I think is a big part of the issue here. We discussed this before I really think she probably needs to be wearing her compression therapy, she probably needs lymphedema pumps if she can ever wear the compression for a significant amount of time to get these, and subsequently also think that she needs to be elevating her legs is much as possible she may even need some vascular intervention in regard to her veins. All of this was reiterated and discussed with her today to reinforce  what needs to happen in order to ensure that her legs do not get a lot worse. The patient voiced understanding. She tells me that she  knows because she is seeing her mom go through a lot of this as well how bad things can get. 11-03-2021 upon evaluation today patient appears to be doing well with regard to her wound. Fortunately there does not appear to be any signs of active infection at this time. She is measuring a little bit bigger but I think this is because the wound is actually cleaning up a bit here. 7/27; left lateral leg wound not any smaller but perhaps with a cleaner surface. She is using Iodoflex to help with the latter and using Tubigrip. She has chronic venous insufficiency with secondary lymphedema. She is apparently followed by vein and vascular and is being scheduled for an ablation 11-17-2021 upon evaluation today patient appears to be doing well with regard to her wound this is actually showing signs of improvement which is great news. Fortunately I do not see any evidence of active infection locally or systemically at this time which is great news. No fevers, chills, nausea, vomiting, or diarrhea. Objective Constitutional [name], [name]. ([id number]) Obese and well-hydrated in no acute distress. Vitals Time Taken: 2:10 PM, Height: 66 in, Weight: 400 lbs, BMI: 64.6, Temperature: 97.8 F, Pulse: 94 bpm, Respiratory Rate: 18 breaths/min, Blood Pressure: 126/85 mmHg. Respiratory normal breathing without difficulty. Psychiatric this patient is able to make decisions and demonstrates good insight into disease process. Alert and Oriented x 3. pleasant and cooperative. General Notes: Upon evaluation patient's wound actually showed some signs of improvement the slough is starting to lift off and the wound looks better. I did perform some light debridement just using saline and gauze no sharp debridement performed. This is due to the pain she is having. The patient tolerated this okay  although it did hurt her quite badly even so. I was trying to be as careful as possible. Integumentary (Hair, Skin) Wound #4 status is Open. Original cause of wound was Gradually Appeared. The date acquired was: 10/17/2021. The wound has been in treatment 3 weeks. The wound is located on the Left,Medial Lower Leg. The wound measures 1.5cm length x 0.5cm width x 0.2cm depth; 0.589cm^2 area and 0.118cm^3 volume. There is Fat Layer (Subcutaneous Tissue) exposed. There is no tunneling or undermining noted. There is a medium amount of serosanguineous drainage noted. There is medium (34-66%) red granulation within the wound bed. There is a medium (34-66%) amount of necrotic tissue within the wound bed including Adherent Slough. 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 Bathing/ Shower/ Hygiene: May shower; gently cleanse wound with antibacterial soap, rinse and pat dry prior to dressing wounds Edema Control - Lymphedema / Segmental Compressive Device / Other: Tubigrip double layer applied - size F Elevate, Exercise Daily and Avoid Standing for Long Periods of Time. Elevate legs to the level of the heart and pump ankles as often as possible Elevate leg(s) parallel to the floor when sitting. WOUND #4: - Lower Leg Wound Laterality: Left, Medial Cleanser: Byram Ancillary Kit - 15 Day Supply (Generic) 3 x Per Week/30 Days Discharge Instructions: Use supplies as instructed; Kit contains: (15) Saline Bullets; (15) 3x3 Gauze; 15 pr Gloves Cleanser: Soap and Water 3 x Per Week/30 Days Discharge Instructions: Gently cleanse wound with antibacterial soap, rinse and pat dry prior to dressing  wounds Primary Dressing:  IODOFLEX 0.9% Cadexomer Iodine Pad 3 x Per Week/30 Days Discharge Instructions: Apply Iodoflex to wound bed only as directed. Secondary Dressing: (SILICONE BORDER) Zetuvit Plus SILICONE BORDER Dressing 4x4 (in/in) (Generic) 3 x Per Week/30 Days Discharge Instructions: Please do not put silicone bordered dressings under wraps. Use non-bordered dressing only. Compression Wrap: tubi grip 3 x Per Week/30 Days Discharge Instructions: size F double layer 1. I am good recommend currently that we go ahead and continue to monitor for any signs of worsening or infection if anything changes she should let me know but right now I think that the Iodoflex is still the best way to go. 2. I am also can recommend that the patient go ahead and schedule her appointment for soon as she can as far as the surgery is concerned for the venous leg issues. Obviously I think that the faster she can get this done the better the wound does not have to be healed before that is undertaken in my opinion. We will see patient back for reevaluation in 1 week here in the clinic. If anything worsens or changes patient will contact our office for additional recommendations. [name], [name] ([id number]) Electronic Signature(s) Signed: 11/17/2021 2:51:03 PM By: Worthy Keeler PA-C Entered By: Worthy Keeler on 11/17/2021 14:51:02 [name], [name] ([id number]) -------------------------------------------------------------------------------- SuperBill Details Patient Name: INGRID, SHIFRIN Date of Service: 11/17/2021 Medical Record Number: [id number] Patient Account Number: [id number] Date of Birth/Sex: [birth year] (36 y.o. F) Treating RN: Carlene Coria Primary Care Provider: [name] Other Clinician: Referring Provider: [name] Treating Provider/Extender: Skipper Cliche in Treatment: 3 Diagnosis Coding ICD-10 Codes Code Description I83.028 Varicose veins of left lower extremity with ulcer other part of lower  leg I89.0 Lymphedema, not elsewhere classified L97.822 Non-pressure chronic ulcer of other part of left lower leg with fat layer exposed E66.01 Morbid (severe) obesity due to excess calories Facility Procedures CPT4 Code: [id number] Description: [phone] - WOUND CARE VISIT-LEV 2 EST PT Modifier: Quantity: 1 Physician Procedures CPT4 Code: 1975883 Description: 25498 - WC PHYS LEVEL 3 - EST PT Modifier: Quantity: 1 CPT4 Code: Description: ICD-10 Diagnosis Description I83.028 Varicose veins of left lower extremity with ulcer other part of lower l I89.0 Lymphedema, not elsewhere classified L97.822 Non-pressure chronic ulcer of other part of left lower leg with fat lay E66.01  Morbid (severe) obesity due to excess calories Modifier: eg er exposed Quantity: Electronic Signature(s) Signed: 11/17/2021 3:42:27 PM By: Carlene Coria RN Signed: 11/17/2021 3:54:34 PM By: Worthy Keeler PA-C Previous Signature: 11/17/2021 2:51:18 PM Version By: Worthy Keeler PA-C Entered By: Carlene Coria on 11/17/2021 15:42:27

## 2021-11-17 NOTE — Progress Notes (Addendum)
CASHLYNN, YEARWOOD ([id number]) Visit Report for 11/17/2021 Arrival Information Details Patient Name: [name], [name]. Date of Service: 11/17/2021 2:00 PM Medical Record Number: [id number] Patient Account Number: [id number] Date of Birth/Sex: 01-14-1986 (36 y.o. F) Treating RN: Carlene Coria Primary Care Treyshawn Muldrew: [name] Other Clinician: Referring Jaki Steptoe: [name] Treating Marquez Ceesay/Extender: Skipper Cliche in Treatment: 3 Visit Information History Since Last Visit All ordered tests and consults were completed: No Patient Arrived: Ambulatory Added or deleted any medications: No Arrival Time: 14:14 Any new allergies or adverse reactions: No Accompanied By: self Had a fall or experienced change in No Transfer Assistance: None activities of daily living that may affect Patient Identification Verified: Yes risk of falls: Secondary Verification Process Completed: Yes Signs or symptoms of abuse/neglect since last visito No Patient Requires Transmission-Based Precautions: No Hospitalized since last visit: No Patient Has Alerts: No Implantable device outside of the clinic excluding No cellular tissue based products placed in the center since last visit: Has Dressing in Place as Prescribed: Yes Pain Present Now: No Electronic Signature(s) Signed: 11/20/2021 8:34:39 AM By: Carlene Coria RN Entered By: Carlene Coria on 11/17/2021 14:15:14 Farinas, Herminio Heads ([id number]) -------------------------------------------------------------------------------- Clinic Level of Care Assessment Details Patient Name: [name], [name] Date of Service: 11/17/2021 2:00 PM Medical Record Number: [id number] Patient Account Number: [id number] Date of Birth/Sex: 1985/06/14 (36 y.o. F) Treating RN: Carlene Coria Primary Care Dwyne Hasegawa: [name] Other Clinician: Referring Leory Allinson: [name] Treating Jamyron Redd/Extender: Skipper Cliche in Treatment: 3 Clinic Level of Care Assessment  Items TOOL 4 Quantity Score X - Use when only an EandM is performed on FOLLOW-UP visit 1 0 ASSESSMENTS - Nursing Assessment / Reassessment X - Reassessment of Co-morbidities (includes updates in patient status) 1 10 X- 1 5 Reassessment of Adherence to Treatment Plan ASSESSMENTS - Wound and Skin Assessment / Reassessment X - Simple Wound Assessment / Reassessment - one wound 1 5 []  - 0 Complex Wound Assessment / Reassessment - multiple wounds []  - 0 Dermatologic / Skin Assessment (not related to wound area) ASSESSMENTS - Focused Assessment []  - Circumferential Edema Measurements - multi extremities 0 []  - 0 Nutritional Assessment / Counseling / Intervention []  - 0 Lower Extremity Assessment (monofilament, tuning fork, pulses) []  - 0 Peripheral Arterial Disease Assessment (using hand held doppler) ASSESSMENTS - Ostomy and/or Continence Assessment and Care []  - Incontinence Assessment and Management 0 []  - 0 Ostomy Care Assessment and Management (repouching, etc.) PROCESS - Coordination of Care X - Simple Patient / Family Education for ongoing care 1 15 []  - 0 Complex (extensive) Patient / Family Education for ongoing care []  - 0 Staff obtains Programmer, systems, Records, Test Results / Process Orders []  - 0 Staff telephones HHA, Nursing Homes / Clarify orders / etc []  - 0 Routine Transfer to another Facility (non-emergent condition) []  - 0 Routine Hospital Admission (non-emergent condition) []  - 0 New Admissions / Biomedical engineer / Ordering NPWT, Apligraf, etc. []  - 0 Emergency Hospital Admission (emergent condition) X- 1 10 Simple Discharge Coordination []  - 0 Complex (extensive) Discharge Coordination PROCESS - Special Needs []  - Pediatric / Minor Patient Management 0 []  - 0 Isolation Patient Management []  - 0 Hearing / Language / Visual special needs []  - 0 Assessment of Community assistance (transportation, D/C planning, etc.) []  - 0 Additional assistance /  Altered mentation []  - 0 Support Surface(s) Assessment (bed, cushion, seat, etc.) INTERVENTIONS - Wound Cleansing / Measurement Potts, Anali J. ([id number]) X- 1 5 Simple Wound Cleansing -  one wound []  - 0 Complex Wound Cleansing - multiple wounds X- 1 5 Wound Imaging (photographs - any number of wounds) []  - 0 Wound Tracing (instead of photographs) X- 1 5 Simple Wound Measurement - one wound []  - 0 Complex Wound Measurement - multiple wounds INTERVENTIONS - Wound Dressings X - Small Wound Dressing one or multiple wounds 1 10 []  - 0 Medium Wound Dressing one or multiple wounds []  - 0 Large Wound Dressing one or multiple wounds []  - 0 Application of Medications - topical []  - 0 Application of Medications - injection INTERVENTIONS - Miscellaneous []  - External ear exam 0 []  - 0 Specimen Collection (cultures, biopsies, blood, body fluids, etc.) []  - 0 Specimen(s) / Culture(s) sent or taken to Lab for analysis []  - 0 Patient Transfer (multiple staff / Civil Service fast streamer / Similar devices) []  - 0 Simple Staple / Suture removal (25 or less) []  - 0 Complex Staple / Suture removal (26 or more) []  - 0 Hypo / Hyperglycemic Management (close monitor of Blood Glucose) []  - 0 Ankle / Brachial Index (ABI) - do not check if billed separately X- 1 5 Vital Signs Has the patient been seen at the hospital within the last three years: Yes Total Score: 75 Level Of Care: New/Established - Level 2 Electronic Signature(s) Signed: 11/20/2021 8:34:39 AM By: Carlene Coria RN Entered By: Carlene Coria on 11/17/2021 15:42:04 Tse, Herminio Heads ([id number]) -------------------------------------------------------------------------------- Encounter Discharge Information Details Patient Name: [name], [name]. Date of Service: 11/17/2021 2:00 PM Medical Record Number: [id number] Patient Account Number: [id number] Date of Birth/Sex: 12/16/1985 (36 y.o. F) Treating RN: Carlene Coria Primary Care Sanai Frick:  [name] Other Clinician: Referring Jsoeph Podesta: [name] Treating Dustie Brittle/Extender: Skipper Cliche in Treatment: 3 Encounter Discharge Information Items Discharge Condition: Stable Ambulatory Status: Ambulatory Discharge Destination: Home Transportation: Private Auto Accompanied By: self Schedule Follow-up Appointment: Yes Clinical Summary of Care: Electronic Signature(s) Signed: 11/20/2021 8:34:39 AM By: Carlene Coria RN Entered By: Carlene Coria on 11/17/2021 14:37:40 Mcfarren, Herminio Heads ([id number]) -------------------------------------------------------------------------------- Lower Extremity Assessment Details Patient Name: [name], [name]. Date of Service: 11/17/2021 2:00 PM Medical Record Number: [id number] Patient Account Number: [id number] Date of Birth/Sex: 11-29-1985 (36 y.o. F) Treating RN: Carlene Coria Primary Care Kiarra Kidd: [name] Other Clinician: Referring Kyro Joswick: [name] Treating Meridith Romick/Extender: Skipper Cliche in Treatment: 3 Edema Assessment Assessed: [Left: No] [Right: No] Edema: [Left: Ye] [Right: s] Calf Left: Right: Point of Measurement: 31 cm From Medial Instep 59 cm Ankle Left: Right: Point of Measurement: 10 cm From Medial Instep 31 cm Vascular Assessment Pulses: Dorsalis Pedis Palpable: [Left:Yes] Electronic Signature(s) Signed: 11/20/2021 8:34:39 AM By: Carlene Coria RN Entered By: Carlene Coria on 11/17/2021 14:22:43 Mullally, Herminio Heads ([id number]) -------------------------------------------------------------------------------- Multi Wound Chart Details Patient Name: [name], [name] Date of Service: 11/17/2021 2:00 PM Medical Record Number: [id number] Patient Account Number: [id number] Date of Birth/Sex: 1986-04-11 (36 y.o. F) Treating RN: Carlene Coria Primary Care Kolbey Teichert: [name] Other Clinician: Referring Kevon Tench: [name] Treating Ceniya Fowers/Extender: Skipper Cliche in Treatment:  3 Vital Signs Height(in): 66 Pulse(bpm): 94 Weight(lbs): 400 Blood Pressure(mmHg): 126/85 Body Mass Index(BMI): 64.6 Temperature(F): 97.8 Respiratory Rate(breaths/min): 18 Photos: [N/A:N/A] Wound Location: Left, Medial Lower Leg N/A N/A Wounding Event: Gradually Appeared N/A N/A Primary Etiology: Venous Leg Ulcer N/A N/A Comorbid History: Hypertension, Peripheral Venous N/A N/A Disease Date Acquired: 10/17/2021 N/A N/A Weeks of Treatment: 3 N/A N/A Wound Status: Open N/A N/A Wound Recurrence: No N/A N/A Measurements L x W x D (  cm) 1.5x0.5x0.2 N/A N/A Area (cm) : 0.589 N/A N/A Volume (cm) : 0.118 N/A N/A % Reduction in Area: -7.10% N/A N/A % Reduction in Volume: -114.50% N/A N/A Classification: Full Thickness Without Exposed N/A N/A Support Structures Exudate Amount: Medium N/A N/A Exudate Type: Serosanguineous N/A N/A Exudate Color: red, brown N/A N/A Granulation Amount: Medium (34-66%) N/A N/A Granulation Quality: Red N/A N/A Necrotic Amount: Medium (34-66%) N/A N/A Exposed Structures: Fat Layer (Subcutaneous Tissue): N/A N/A Yes Fascia: No Tendon: No Muscle: No Joint: No Bone: No Epithelialization: None N/A N/A Treatment Notes Electronic Signature(s) Signed: 11/20/2021 8:34:39 AM By: Carlene Coria RN Entered By: Carlene Coria on 11/17/2021 14:22:55 Severns, Herminio Heads ([id number]) -------------------------------------------------------------------------------- Cameron Details Patient Name: [name], [name]. Date of Service: 11/17/2021 2:00 PM Medical Record Number: [id number] Patient Account Number: [id number] Date of Birth/Sex: February 15, 1986 (36 y.o. F) Treating RN: Carlene Coria Primary Care Ellicia Alix: [name] Other Clinician: Referring Aneyah Lortz: [name] Treating Daiveon Markman/Extender: Skipper Cliche in Treatment: 3 Active Inactive Wound/Skin Impairment Nursing Diagnoses: Knowledge deficit related to ulceration/compromised skin  integrity Goals: Patient/caregiver will verbalize understanding of skin care regimen Date Initiated: 10/27/2021 Target Resolution Date: 11/27/2021 Goal Status: Active Ulcer/skin breakdown will have a volume reduction of 30% by week 4 Date Initiated: 10/27/2021 Target Resolution Date: 12/28/2021 Goal Status: Active Ulcer/skin breakdown will have a volume reduction of 50% by week 8 Date Initiated: 10/27/2021 Target Resolution Date: 01/27/2022 Goal Status: Active Ulcer/skin breakdown will have a volume reduction of 80% by week 12 Date Initiated: 10/27/2021 Target Resolution Date: 02/27/2022 Goal Status: Active Ulcer/skin breakdown will heal within 14 weeks Date Initiated: 10/27/2021 Target Resolution Date: 03/29/2022 Goal Status: Active Interventions: Assess patient/caregiver ability to obtain necessary supplies Assess patient/caregiver ability to perform ulcer/skin care regimen upon admission and as needed Assess ulceration(s) every visit Notes: Electronic Signature(s) Signed: 11/20/2021 8:34:39 AM By: Carlene Coria RN Entered By: Carlene Coria on 11/17/2021 14:22:48 Inda, Herminio Heads ([id number]) -------------------------------------------------------------------------------- Pain Assessment Details Patient Name: [name], [name] Date of Service: 11/17/2021 2:00 PM Medical Record Number: [id number] Patient Account Number: [id number] Date of Birth/Sex: 1985/08/10 (36 y.o. F) Treating RN: Carlene Coria Primary Care Zohaib Heeney: [name] Other Clinician: Referring Haik Mahoney: [name] Treating Deshon Koslowski/Extender: Skipper Cliche in Treatment: 3 Active Problems Location of Pain Severity and Description of Pain Patient Has Paino No Site Locations Pain Management and Medication Current Pain Management: Electronic Signature(s) Signed: 11/20/2021 8:34:39 AM By: Carlene Coria RN Entered By: Carlene Coria on 11/17/2021 14:21:31 Chatwin, Herminio Heads  ([id number]) -------------------------------------------------------------------------------- Patient/Caregiver Education Details Patient Name: [name], [name]. Date of Service: 11/17/2021 2:00 PM Medical Record Number: [id number] Patient Account Number: [id number] Date of Birth/Gender: 05/08/1985 (36 y.o. F) Treating RN: Carlene Coria Primary Care Physician: [name] Other Clinician: Referring Physician: [name] Treating Physician/Extender: Skipper Cliche in Treatment: 3 Education Assessment Education Provided To: Patient Education Topics Provided Wound/Skin Impairment: Methods: Explain/Verbal Responses: State content correctly Electronic Signature(s) Signed: 11/20/2021 8:34:39 AM By: Carlene Coria RN Entered By: Carlene Coria on 11/17/2021 15:42:33 Routh, Herminio Heads ([id number]) -------------------------------------------------------------------------------- Wound Assessment Details Patient Name: [name], [name]. Date of Service: 11/17/2021 2:00 PM Medical Record Number: [id number] Patient Account Number: [id number] Date of Birth/Sex: 06/28/1985 (36 y.o. F) Treating RN: Carlene Coria Primary Care Emeterio Balke: [name] Other Clinician: Referring Raynetta Osterloh: [name] Treating Percy Winterrowd/Extender: Skipper Cliche in Treatment: 3 Wound Status Wound Number: 4 Primary Etiology: Venous Leg Ulcer Wound Location: Left, Medial Lower Leg Wound Status: Open Wounding Event: Gradually Appeared  Comorbid History: Hypertension, Peripheral Venous Disease Date Acquired: 10/17/2021 Weeks Of Treatment: 3 Clustered Wound: No Photos Wound Measurements Length: (cm) 1.5 Width: (cm) 0.5 Depth: (cm) 0.2 Area: (cm) 0.589 Volume: (cm) 0.118 % Reduction in Area: -7.1% % Reduction in Volume: -114.5% Epithelialization: None Tunneling: No Undermining: No Wound Description Classification: Full Thickness Without Exposed Support Structures Exudate Amount: Medium Exudate  Type: Serosanguineous Exudate Color: red, brown Foul Odor After Cleansing: No Slough/Fibrino No Wound Bed Granulation Amount: Medium (34-66%) Exposed Structure Granulation Quality: Red Fascia Exposed: No Necrotic Amount: Medium (34-66%) Fat Layer (Subcutaneous Tissue) Exposed: Yes Necrotic Quality: Adherent Slough Tendon Exposed: No Muscle Exposed: No Joint Exposed: No Bone Exposed: No Treatment Notes Wound #4 (Lower Leg) Wound Laterality: Left, Medial Cleanser Byram Ancillary Kit - 15 Day Supply Discharge Instruction: Use supplies as instructed; Kit contains: (15) Saline Bullets; (15) 3x3 Gauze; 15 pr Gloves Soap and Water Discharge Instruction: Gently cleanse wound with antibacterial soap, rinse and pat dry prior to dressing wounds Mcgriff, Mallisa J. ([id number]) Peri-Wound Care Topical Primary Dressing IODOFLEX 0.9% Cadexomer Iodine Pad Discharge Instruction: Apply Iodoflex to wound bed only as directed. Secondary Dressing (SILICONE BORDER) Zetuvit Plus SILICONE BORDER Dressing 4x4 (in/in) Discharge Instruction: Please do not put silicone bordered dressings under wraps. Use non-bordered dressing only. Secured With Compression Wrap tubi grip Discharge Instruction: size F double layer Compression Stockings Add-Ons Electronic Signature(s) Signed: 11/20/2021 8:34:39 AM By: Carlene Coria RN Entered By: Carlene Coria on 11/17/2021 14:21:53 Vitolo, Herminio Heads ([id number]) -------------------------------------------------------------------------------- Vitals Details Patient Name: [name], [name] Date of Service: 11/17/2021 2:00 PM Medical Record Number: [id number] Patient Account Number: [id number] Date of Birth/Sex: [birth year] (36 y.o. F) Treating RN: Carlene Coria Primary Care Allison Silva: [name] Other Clinician: Referring Salwa Bai: [name] Treating Cherolyn Behrle/Extender: Skipper Cliche in Treatment: 3 Vital Signs Time Taken: 14:10 Temperature (F):  97.8 Height (in): 66 Pulse (bpm): 94 Weight (lbs): 400 Respiratory Rate (breaths/min): 18 Body Mass Index (BMI): 64.6 Blood Pressure (mmHg): 126/85 Reference Range: 80 - 120 mg / dl Electronic Signature(s) Signed: 11/20/2021 8:34:39 AM By: Carlene Coria RN Entered By: Carlene Coria on 11/17/2021 14:15:49

## 2021-11-22 ENCOUNTER — Other Ambulatory Visit: Payer: Self-pay

## 2021-11-22 DIAGNOSIS — I83023 Varicose veins of left lower extremity with ulcer of ankle: Secondary | ICD-10-CM

## 2021-11-22 DIAGNOSIS — I83893 Varicose veins of bilateral lower extremities with other complications: Secondary | ICD-10-CM

## 2021-11-22 NOTE — Telephone Encounter (Signed)
Patient called requesting to schedule ligation/stripping procedure after Oct 11. Procedure scheduled for Oct 19 at [facility]. Instructions reviewed- patient verbalized understanding.

## 2021-11-24 ENCOUNTER — Encounter: Payer: BC Managed Care – PPO | Admitting: Physician Assistant

## 2021-11-24 DIAGNOSIS — I83028 Varicose veins of left lower extremity with ulcer other part of lower leg: Secondary | ICD-10-CM | POA: Diagnosis not present

## 2021-11-24 NOTE — Progress Notes (Addendum)
[name] ([id number]) Visit Report for 11/24/2021 Chief Complaint Document Details Patient Name: [name], [name]. Date of Service: 11/24/2021 1:30 PM Medical Record Number: [id number] Patient Account Number: [id number] Date of Birth/Sex: 03-12-1986 (36 y.o. F) Treating RN: [name] Primary Care Provider: Salvadore [name] Other Clinician: Massie [name] Referring Provider: Salvadore [name] Treating Provider/Extender: [name] in Treatment: 4 Information Obtained from: Patient Chief Complaint Left LE Ulcer Electronic Signature(s) Signed: 11/24/2021 2:13:50 PM By: [name] Keeler PA-C Entered By: [name] on 11/24/2021 14:13:50 [name], [name] ([id number]) -------------------------------------------------------------------------------- HPI Details Patient Name: [name], [name] Date of Service: 11/24/2021 1:30 PM Medical Record Number: [id number] Patient Account Number: [id number] Date of Birth/Sex: 02-08-1986 (36 y.o. F) Treating RN: [name] Primary Care Provider: Salvadore [name] Other Clinician: Massie [name] Referring Provider: Salvadore [name] Treating Provider/Extender: [name] in Treatment: 4 History of Present Illness HPI Description: 36 year old patient who started with having ulcerations on the right lower leg on the lateral part of her ankle for about 2 weeks. She was seen in the ER at [facility] and advised to see the wound care for a consultation. No X-rays of workup was done during the ER visit and no prescription for any medications of compression wraps were given. the patient is not diabetic but does have hypertension and her medications have been reviewed by me. 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 The right and left greater saphenous vein was found to be  tortuous. Deep venous system was also not competent and there was reflux of more than 500 ms. She was then seen by [name] who recommended that the patient would not benefit from endovenous ablation and he had recommended vein stripping on the right side and multiple small phlebectomy procedures on the left side. the patient did not follow-up due to social economic reasons. She has not been wearing any compression stockings and has not taken any specific treatment for varicose veins for the last 3 years. 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 We have obtained some appointments to see the vascular surgeons by the end of the month and the patient would like to follow up with me at my [facility] on Wednesday, June 29. 10/14/2014 -- she could not see me yesterday in [place] and hence has come for a review today. She has a vascular workup to be done this afternoon at [facility]. She is doing fine otherwise. 10/22/2014 -- she was seen by [name] and he has recommended surgical removal of her right saphenous vein from distal thigh to saphenofemoral junction and stab phlebectomy's of multiple large tributary branches throughout her thigh and calf. This would be done under general anesthesia in the outpatient setting. 10/29/2014 -- she is trying to work on a surgical date and in the meanwhile we have got insurance clearance for Apligraf and we will start this next week. 7/22 2016 -- she is here for the first application of Apligraf. 11/19/2014 -- she is here for a second application of Apligraf 11/26/2014 -- she has done fine after her last application of Apligraf and is awaiting her surgery which is scheduled for August 31. 12/03/2014 -- she is doing fine and is here  for her third application of Apligraf. 12/21/2014 -- She had surgery on 12/15/2014 by AlejandraEarly who did #1  ligation and stripping of right great saphenous vein from distal thigh to saphenofemoral junction, #2 stab phlebectomy of large tributary varicose veins in the thigh popliteal space and calf. She had an Ace wrap up to her groin and this was removed today and the Unna's boot was also removed. 12/28/2014 -- she is here for her fourth application of Apligraf. 01/06/2015 - he saw her vascular surgeon [name] who was pleased with her progress and he has confirmed that no surgical procedures could be attempted on the left side. 01/13/2015 -- her wound looks very good and she's been having no problems whatsoever. Readmission: 07/26/2020 upon evaluation today patient presents for initial inspection here in our clinic for a new issue with her left leg although she is previously been seen due to issues with the right leg back in 2016. At that time she was seeing [name] who is a vein/vascular specialist in [place]. He has since semiretired from what I understand. He is working out of [employer] I believe. Nonetheless she tells me at the time that there was really nothing to do for her left leg although the right leg was where they did most of the work. Subsequently she states that she is done fairly well until just in the past week where she had issues with bleeding from what appears to be varicose vein on the left leg medially. Unfortunately this has continued to be an issue although she tells me at first it was coming much more significantly Down quite a bit but nonetheless has not completely resolved. Every time she showers she notices that it starts to drain a little bit more. She does have a history of chronic venous insufficiency, lymphedema, varicose veins bilaterally, and obesity. 08/02/2020 upon evaluation today patient appears to be doing about the same in regards to the ulcer on her left leg. She has some eschar covering there is definitely some fluid collecting underneath unfortunately. With  that being said she tells me she is still having a tremendous amount of pain therefore she is really not able to allow me to clean this off very effectively to be perfectly honest. I think we need to try to soften this up 08/16/2020 upon evaluation today patient's wound is really not doing significantly better not really states about the same. She notes that the wrap just does not seem to be staying up very well at all unfortunately. No fevers, chills, nausea, vomiting, or diarrhea. She did cut it off once it starts to slide in order to alleviate some of the pressure from sliding Down. Fortunately there is no signs of active infection at this time which is great news. 08/23/2020 upon evaluation today patient appears to be doing well 08/23/2020 upon evaluation today patient appears to be doing well with regard to her wound all things considered. Fortunately there does not appear to be any signs of active infection at this time which is great news. She has been tolerating the dressing changes without complication and overall I am extremely pleased with where things stand at this point. She does have her appointment with vascular in [facility] on June 9. ELLEE, WAWRZYNIAK ([id number]) 08/30/2020 upon evaluation today patient actually appears to be doing decently well in regard to her wound. Fortunately there is no signs of active infection which is great news. Nonetheless I do believe that the patient is going require little  bit of debridement if she is okay with me attempting that today I think that will help clean off some of the necrotic tissue. Fortunately there does not appear to be otherwise any evidence of active infection which is also great news. 09/19/2020 upon evaluation today patient appears to be doing a little better in regard to her wound as compared to previous. Fortunately there does not appear to be any signs of active infection overall. No fever chills noted. I do believe that the Iodosorb  has made this a little bit better with regard to the overall size and appearance of the wound bed though again she does still have quite a ways to go to get this to heal she still very tender to touch. 09/27/2020 upon evaluation today patient appears to actually be doing quite well with regard to her wound. This is measuring much smaller which is great news. With that being said she did see vein and vascular in [facility] and they subsequently recommended that surgery is really what she probably needs to go forward with sounds like the potential for venous ablation. With that being said the patient tells me this is just not the right time for her to be able to proceed with any type of surgery which I completely understand. Nonetheless I do believe that she would continue to benefit from compression but again that is really not something that she is able to easily do. 10/04/2020 upon evaluation today patient appears to be doing about the same in regard to her wound. This is measuring a little bit smaller but nonetheless still is open and again has some slough and biofilm noted on the surface of the wound. There does not appear to be any signs of active infection which is great news. No fevers, chills, nausea, vomiting, or diarrhea. 10/04/2020 upon evaluation today patient appears to be doing well with regard to her wound. She has been tolerating dressing changes without complication. Fortunately there does not appear to be any signs of active infection which is great news. No fevers, chills, nausea, vomiting, or diarrhea. 10/25/2020 upon evaluation today patient appears to be doing well with regard to her wound. She has been tolerating the dressing changes without complication. Fortunately there is no signs of active infection at this time. No fevers, chills, nausea, vomiting, or diarrhea. 11/01/2020 upon evaluation today patient with regard to her wound. She has been tolerating the dressing changes without  complication. Fortunately there does not appear to be any signs of infection which is great news. No fever chills noted 11/15/2020 upon evaluation today patient appears to be doing well with regard to her wound. Fortunately there is no signs of active infection at this time. No fevers, chills, nausea, vomiting, or diarrhea. With that being said she continues to have a significant amount of pain at the site even though this is very close to complete closure. She also had several varicose veins around the area which were also problematic. Overall however I feel like the patient is making excellent progress. 11/28/2020 upon evaluation today patient appears to be doing well with regard to her leg ulcer. Again were not really able to debride or compression wrap her due to discomfort and pain. She does not allow for that. With that being said we have been using Iodosorb which does seem to be doing decently well. Fortunately there is no signs of active infection at this time which is great news. No fevers, chills, nausea, vomiting, or diarrhea. 8/31; patient presents  for 2-week follow-up. She has been using Iodosorb. She reports that the wound is closed. She denies signs of infection. Readmission: 10-27-2021 upon evaluation this is a patient that presents today whom I have previously seen this is pretty much for the same issue though I think a little bit higher than the last time I saw her. She does have a history of chronic venous insufficiency and hypertension along with varicose veins. Subsequently she does have an ulceration which spontaneously ruptured she has not been wearing any compression which I think is a big part of the issue here. We discussed this before I really think she probably needs to be wearing her compression therapy, she probably needs lymphedema pumps if she can ever wear the compression for a significant amount of time to get these, and subsequently also think that she needs to be  elevating her legs is much as possible she may even need some vascular intervention in regard to her veins. All of this was reiterated and discussed with her today to reinforce what needs to happen in order to ensure that her legs do not get a lot worse. The patient voiced understanding. She tells me that she knows because she is seeing her mom go through a lot of this as well how bad things can get. 11-03-2021 upon evaluation today patient appears to be doing well with regard to her wound. Fortunately there does not appear to be any signs of active infection at this time. She is measuring a little bit bigger but I think this is because the wound is actually cleaning up a bit here. 7/27; left lateral leg wound not any smaller but perhaps with a cleaner surface. She is using Iodoflex to help with the latter and using Tubigrip. She has chronic venous insufficiency with secondary lymphedema. She is apparently followed by vein and vascular and is being scheduled for an ablation 11-17-2021 upon evaluation today patient appears to be doing well with regard to her wound this is actually showing signs of improvement which is great news. Fortunately I do not see any evidence of active infection locally or systemically at this time which is great news. No fevers, chills, nausea, vomiting, or diarrhea. 11-24-2021 upon evaluation today patient's wound is actually showing signs of significant improvement. Unfortunately she had quite a bit of pain with debridement last week I do believe it was beneficial but at the same time she is doing much better but still really does not want this debrided again I think being that it is looking a whole lot better I would try to avoid that today especially since it caused her so much discomfort that is really not the goal and I explained that to the patient today she voiced understanding and knows that it needed to be done but still states that it was quite painful. Electronic  Signature(s) Signed: 11/24/2021 4:48:58 PM By: [name] Keeler PA-C Entered By: [name] on 11/24/2021 16:48:58 Toro, [name] ([id number]) -------------------------------------------------------------------------------- Physical Exam Details Patient Name: [name], [name]. Date of Service: 11/24/2021 1:30 PM Medical Record Number: [id number] Patient Account Number: [id number] Date of Birth/Sex: 09-Jul-1985 (36 y.o. F) Treating RN: [name] Primary Care Provider: Salvadore [name] Other Clinician: Massie [name] Referring Provider: Salvadore [name] Treating Provider/Extender: [name] in Treatment: 4 Constitutional Well-nourished and well-hydrated in no acute distress. Respiratory normal breathing without difficulty. Psychiatric this patient is able to make decisions and demonstrates good insight into disease process. Alert and Oriented x 3. pleasant  and cooperative. Notes Upon inspection patient's wound bed actually showed signs of good granulation and epithelization in a lot of areas very pleased with where we stand today. I do not see any evidence of active infection locally or systemically which is great news. Electronic Signature(s) Signed: 11/24/2021 4:49:19 PM By: [name] Keeler PA-C Entered By: [name] on 11/24/2021 16:49:19 [place], [name] ([id number]) -------------------------------------------------------------------------------- Physician Orders Details Patient Name: [name], [name] Date of Service: 11/24/2021 1:30 PM Medical Record Number: [id number] Patient Account Number: [id number] Date of Birth/Sex: 1985/05/22 (36 y.o. F) Treating RN: [name] Primary Care Provider: Salvadore [name] Other Clinician: Massie [name] Referring Provider: Salvadore [name] Treating Provider/Extender: [name] in Treatment: 4 Verbal / Phone Orders: No Diagnosis Coding ICD-10 Coding Code Description I83.028 Varicose veins of left lower extremity  with ulcer other part of lower leg I89.0 Lymphedema, not elsewhere classified L97.822 Non-pressure chronic ulcer of other part of left lower leg with fat layer exposed E66.01 Morbid (severe) obesity due to excess calories Follow-up Appointments o Return Appointment in 1 week. Bathing/ Shower/ Hygiene o May shower; gently cleanse wound with antibacterial soap, rinse and pat dry prior to dressing wounds Edema Control - Lymphedema / Segmental Compressive Device / Other o Tubigrip double layer applied - size E left leg o Elevate, Exercise Daily and Avoid Standing for Long Periods of Time. o Elevate legs to the level of the heart and pump ankles as often as possible o Elevate leg(s) parallel to the floor when sitting. Wound Treatment Wound #4 - Lower Leg Wound Laterality: Left, Medial Cleanser: Byram Ancillary Kit - 15 Day Supply (Generic) 3 x Per Week/30 Days Discharge Instructions: Use supplies as instructed; Kit contains: (15) Saline Bullets; (15) 3x3 Gauze; 15 pr Gloves Cleanser: Soap and Water 3 x Per Week/30 Days Discharge Instructions: Gently cleanse wound with antibacterial soap, rinse and pat dry prior to dressing wounds Primary Dressing: IODOFLEX 0.9% Cadexomer Iodine Pad 3 x Per Week/30 Days Discharge Instructions: Apply Iodoflex to wound bed only as directed. Secondary Dressing: (BORDER) Zetuvit Plus SILICONE BORDER Dressing 4x4 (in/in) (Generic) 3 x Per Week/30 Days Discharge Instructions: Please do not put silicone bordered dressings under wraps. Use non-bordered dressing only. Compression Wrap: tubi grip 3 x Per Week/30 Days Discharge Instructions: size E double layer Electronic Signature(s) Signed: 11/24/2021 4:28:06 PM By: [name] Signed: 11/24/2021 5:04:29 PM By: [name] Keeler PA-C Entered By: [name] on 11/24/2021 14:17:39 [name], [name] ([id number]) -------------------------------------------------------------------------------- Problem List  Details Patient Name: TYLEE, YUM. Date of Service: 11/24/2021 1:30 PM Medical Record Number: [id number] Patient Account Number: [id number] Date of Birth/Sex: 08-16-1985 (36 y.o. 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 layer 10/27/2021 No Yes exposed E66.01 Morbid (severe) obesity due to excess calories 10/27/2021 No Yes Inactive Problems Resolved Problems Electronic Signature(s) Signed: 11/24/2021 2:13:47 PM By: [name] Keeler PA-C Entered By: [name] on 11/24/2021 14:13:46 [name], [name] ([id number]) -------------------------------------------------------------------------------- Progress Note Details Patient Name: [name] Date of Service: 11/24/2021 1:30 PM Medical Record Number: [id number] Patient Account Number: [id number] Date of Birth/Sex: 24-Jun-1985 (35  y.o. F) Treating RN: [name] Primary Care Provider: Salvadore [name] Other Clinician: Massie [name] Referring Provider: Salvadore [name] Treating Provider/Extender: [name] in Treatment: 4 Subjective Chief Complaint Information obtained from Patient Left LE Ulcer History of Present Illness (HPI) 36 year old patient who started with having ulcerations on the right lower leg on the lateral part of her ankle for about 2 weeks. She was seen in the ER at [facility] and advised to see the wound care for a consultation. No X-rays of workup was done during the ER visit and no prescription for any medications of compression wraps  were given. the patient is not diabetic but does have hypertension and her medications have been reviewed by me. 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 The right and left greater saphenous vein was found to be tortuous. Deep venous system was also not competent and there was reflux of more than 500 ms. She was then seen by [name] who recommended that the patient would not benefit from endovenous ablation and he had recommended vein stripping on the right side and multiple small phlebectomy procedures on the left side. the patient did not follow-up due to social economic reasons. She has not been wearing any compression stockings and has not taken any specific treatment for varicose veins for the last 3 years. 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 We have obtained some appointments to see the vascular surgeons by the end of the month and the patient would like to follow up with me at my [facility] on Wednesday, June 29. 10/14/2014 -- she could not see me yesterday in [place] and hence has come for a review today. She has a vascular workup to be done this afternoon at [facility]. She is doing fine otherwise. 10/22/2014 -- she was seen by [name] and he has recommended surgical removal of her right saphenous vein from distal thigh to saphenofemoral junction and stab phlebectomy's of multiple large tributary branches throughout her thigh and calf. This would be done under general anesthesia in the outpatient setting. 10/29/2014 -- she is trying to work on a surgical date and in the meanwhile we have got insurance clearance for Apligraf and we will start this next week. 7/22 2016 -- she is here for the  first application of Apligraf. 11/19/2014 -- she is here for a second application of Apligraf 11/26/2014 -- she has done fine after her last application of Apligraf and is awaiting her surgery which is scheduled for August 31. 12/03/2014 -- she is doing fine and is here for her third application of Apligraf. 12/21/2014 -- She had surgery on 12/15/2014 by AlejandraEarly who did #1 ligation and stripping of right great saphenous vein from distal thigh to saphenofemoral junction, #2 stab phlebectomy of large tributary varicose veins in the thigh popliteal space and calf. She had an Ace wrap up to her groin and this was removed today and the Unna's boot was also removed. 12/28/2014 -- she is here for her fourth application of Apligraf. 01/06/2015 - he saw her vascular surgeon [name] who was pleased with her progress and he has confirmed that no surgical procedures could be attempted on the left side. 01/13/2015 -- her  wound looks very good and she's been having no problems whatsoever. Readmission: 07/26/2020 upon evaluation today patient presents for initial inspection here in our clinic for a new issue with her left leg although she is previously been seen due to issues with the right leg back in 2016. At that time she was seeing [name] who is a vein/vascular specialist in [place]. He has since semiretired from what I understand. He is working out of [employer] I believe. Nonetheless she tells me at the time that there was really nothing to do for her left leg although the right leg was where they did most of the work. Subsequently she states that she is done fairly well until just in the past week where she had issues with bleeding from what appears to be varicose vein on the left leg medially. Unfortunately this has continued to be an issue although she tells me at first it was coming much more significantly Down quite a bit but nonetheless has not completely resolved. Every time she showers she  notices that it starts to drain a little bit more. She does have a history of chronic venous insufficiency, lymphedema, varicose veins bilaterally, and obesity. 08/02/2020 upon evaluation today patient appears to be doing about the same in regards to the ulcer on her left leg. She has some eschar covering there is definitely some fluid collecting underneath unfortunately. With that being said she tells me she is still having a tremendous amount of pain therefore she is really not able to allow me to clean this off very effectively to be perfectly honest. I think we need to try to soften this up 08/16/2020 upon evaluation today patient's wound is really not doing significantly better not really states about the same. She notes that the wrap just does not seem to be staying up very well at all unfortunately. No fevers, chills, nausea, vomiting, or diarrhea. She did cut it off once it starts to slide in order to alleviate some of the pressure from sliding Down. Fortunately there is no signs of active infection at this time which is great news. MELEENA, MUNROE ([id number]) 08/23/2020 upon evaluation today patient appears to be doing well 08/23/2020 upon evaluation today patient appears to be doing well with regard to her wound all things considered. Fortunately there does not appear to be any signs of active infection at this time which is great news. She has been tolerating the dressing changes without complication and overall I am extremely pleased with where things stand at this point. She does have her appointment with vascular in [facility] on June 9. 08/30/2020 upon evaluation today patient actually appears to be doing decently well in regard to her wound. Fortunately there is no signs of active infection which is great news. Nonetheless I do believe that the patient is going require little bit of debridement if she is okay with me attempting that today I think that will help clean off some of the  necrotic tissue. Fortunately there does not appear to be otherwise any evidence of active infection which is also great news. 09/19/2020 upon evaluation today patient appears to be doing a little better in regard to her wound as compared to previous. Fortunately there does not appear to be any signs of active infection overall. No fever chills noted. I do believe that the Iodosorb has made this a little bit better with regard to the overall size and appearance of the wound bed though again she does still have quite  a ways to go to get this to heal she still very tender to touch. 09/27/2020 upon evaluation today patient appears to actually be doing quite well with regard to her wound. This is measuring much smaller which is great news. With that being said she did see vein and vascular in [facility] and they subsequently recommended that surgery is really what she probably needs to go forward with sounds like the potential for venous ablation. With that being said the patient tells me this is just not the right time for her to be able to proceed with any type of surgery which I completely understand. Nonetheless I do believe that she would continue to benefit from compression but again that is really not something that she is able to easily do. 10/04/2020 upon evaluation today patient appears to be doing about the same in regard to her wound. This is measuring a little bit smaller but nonetheless still is open and again has some slough and biofilm noted on the surface of the wound. There does not appear to be any signs of active infection which is great news. No fevers, chills, nausea, vomiting, or diarrhea. 10/04/2020 upon evaluation today patient appears to be doing well with regard to her wound. She has been tolerating dressing changes without complication. Fortunately there does not appear to be any signs of active infection which is great news. No fevers, chills, nausea, vomiting, or  diarrhea. 10/25/2020 upon evaluation today patient appears to be doing well with regard to her wound. She has been tolerating the dressing changes without complication. Fortunately there is no signs of active infection at this time. No fevers, chills, nausea, vomiting, or diarrhea. 11/01/2020 upon evaluation today patient with regard to her wound. She has been tolerating the dressing changes without complication. Fortunately there does not appear to be any signs of infection which is great news. No fever chills noted 11/15/2020 upon evaluation today patient appears to be doing well with regard to her wound. Fortunately there is no signs of active infection at this time. No fevers, chills, nausea, vomiting, or diarrhea. With that being said she continues to have a significant amount of pain at the site even though this is very close to complete closure. She also had several varicose veins around the area which were also problematic. Overall however I feel like the patient is making excellent progress. 11/28/2020 upon evaluation today patient appears to be doing well with regard to her leg ulcer. Again were not really able to debride or compression wrap her due to discomfort and pain. She does not allow for that. With that being said we have been using Iodosorb which does seem to be doing decently well. Fortunately there is no signs of active infection at this time which is great news. No fevers, chills, nausea, vomiting, or diarrhea. 8/31; patient presents for 2-week follow-up. She has been using Iodosorb. She reports that the wound is closed. She denies signs of infection. Readmission: 10-27-2021 upon evaluation this is a patient that presents today whom I have previously seen this is pretty much for the same issue though I think a little bit higher than the last time I saw her. She does have a history of chronic venous insufficiency and hypertension along with varicose veins. Subsequently she does have  an ulceration which spontaneously ruptured she has not been wearing any compression which I think is a big part of the issue here. We discussed this before I really think she probably needs to be  wearing her compression therapy, she probably needs lymphedema pumps if she can ever wear the compression for a significant amount of time to get these, and subsequently also think that she needs to be elevating her legs is much as possible she may even need some vascular intervention in regard to her veins. All of this was reiterated and discussed with her today to reinforce what needs to happen in order to ensure that her legs do not get a lot worse. The patient voiced understanding. She tells me that she knows because she is seeing her mom go through a lot of this as well how bad things can get. 11-03-2021 upon evaluation today patient appears to be doing well with regard to her wound. Fortunately there does not appear to be any signs of active infection at this time. She is measuring a little bit bigger but I think this is because the wound is actually cleaning up a bit here. 7/27; left lateral leg wound not any smaller but perhaps with a cleaner surface. She is using Iodoflex to help with the latter and using Tubigrip. She has chronic venous insufficiency with secondary lymphedema. She is apparently followed by vein and vascular and is being scheduled for an ablation 11-17-2021 upon evaluation today patient appears to be doing well with regard to her wound this is actually showing signs of improvement which is great news. Fortunately I do not see any evidence of active infection locally or systemically at this time which is great news. No fevers, chills, nausea, vomiting, or diarrhea. 11-24-2021 upon evaluation today patient's wound is actually showing signs of significant improvement. Unfortunately she had quite a bit of pain with debridement last week I do believe it was beneficial but at the same time she  is doing much better but still really does not want this debrided again I think being that it is looking a whole lot better I would try to avoid that today especially since it caused her so much discomfort that is really not the goal and I explained that to the patient today she voiced understanding and knows that it needed to be done but still states that it was quite painful. [name], [name] ([id number]) Objective Constitutional Well-nourished and well-hydrated in no acute distress. Vitals Time Taken: 1:56 PM, Height: 66 in, Weight: 400 lbs, BMI: 64.6, Temperature: 98.2 F, Pulse: 89 bpm, Respiratory Rate: 16 breaths/min, Blood Pressure: 121/89 mmHg. Respiratory normal breathing without difficulty. Psychiatric this patient is able to make decisions and demonstrates good insight into disease process. Alert and Oriented x 3. pleasant and cooperative. General Notes: Upon inspection patient's wound bed actually showed signs of good granulation and epithelization in a lot of areas very pleased with where we stand today. I do not see any evidence of active infection locally or systemically which is great news. Integumentary (Hair, Skin) Wound #4 status is Open. Original cause of wound was Gradually Appeared. The date acquired was: 10/17/2021. The wound has been in treatment 4 weeks. The wound is located on the Left,Medial Lower Leg. The wound measures 0.6cm length x 0.8cm width x 0.2cm depth; 0.377cm^2 area and 0.075cm^3 volume. There is Fat Layer (Subcutaneous Tissue) exposed. There is a medium amount of serosanguineous drainage noted. There is medium (34-66%) red granulation within the wound bed. There is a medium (34-66%) amount of necrotic tissue within the wound bed including Adherent Slough. Assessment Active Problems ICD-10 Varicose veins of left lower extremity with ulcer other part of lower leg Lymphedema, not  elsewhere classified Non-pressure chronic ulcer of other part of left lower  leg with fat layer exposed Morbid (severe) obesity due to excess calories Plan Follow-up Appointments: Return Appointment in 1 week. Bathing/ Shower/ Hygiene: May shower; gently cleanse wound with antibacterial soap, rinse and pat dry prior to dressing wounds Edema Control - Lymphedema / Segmental Compressive Device / Other: Tubigrip double layer applied - size E left leg Elevate, Exercise Daily and Avoid Standing for Long Periods of Time. Elevate legs to the level of the heart and pump ankles as often as possible Elevate leg(s) parallel to the floor when sitting. WOUND #4: - Lower Leg Wound Laterality: Left, Medial Cleanser: Byram Ancillary Kit - 15 Day Supply (Generic) 3 x Per Week/30 Days Discharge Instructions: Use supplies as instructed; Kit contains: (15) Saline Bullets; (15) 3x3 Gauze; 15 pr Gloves Cleanser: Soap and Water 3 x Per Week/30 Days Discharge Instructions: Gently cleanse wound with antibacterial soap, rinse and pat dry prior to dressing wounds Primary Dressing: IODOFLEX 0.9% Cadexomer Iodine Pad 3 x Per Week/30 Days Discharge Instructions: Apply Iodoflex to wound bed only as directed. Secondary Dressing: (BORDER) Zetuvit Plus SILICONE BORDER Dressing 4x4 (in/in) (Generic) 3 x Per Week/30 Days Discharge Instructions: Please do not put silicone bordered dressings under wraps. Use non-bordered dressing only. Compression Wrap: tubi grip 3 x Per Week/30 Days Discharge Instructions: size E double layer 1. I am going to suggest that we going continue with the Iodosorb which I think is still doing a good job here. 2. I am also can recommend that we have the patient continue to monitor for any signs of worsening infection IF anything changes she should contact the office and let me know. [name], [name] ([id number]) We will see patient back for reevaluation in 1 week here in the clinic. If anything worsens or changes patient will contact our office for  additional recommendations. Electronic Signature(s) Signed: 11/24/2021 4:49:32 PM By: [name] Keeler PA-C Entered By: [name] on 11/24/2021 16:49:32 [name], [name] ([id number]) -------------------------------------------------------------------------------- SuperBill Details Patient Name: [name], [name] Date of Service: 11/24/2021 Medical Record Number: [id number] Patient Account Number: [id number] Date of Birth/Sex: 12/08/1985 (36 y.o. F) Treating RN: [name] Primary Care Provider: Salvadore [name] Other Clinician: Massie [name] Referring Provider: Salvadore [name] Treating Provider/Extender: [name] in Treatment: 4 Diagnosis Coding ICD-10 Codes Code Description I83.028 Varicose veins of left lower extremity with ulcer other part of lower leg I89.0 Lymphedema, not elsewhere classified L97.822 Non-pressure chronic ulcer of other part of left lower leg with fat layer exposed E66.01 Morbid (severe) obesity due to excess calories Facility Procedures CPT4 Code: 50539767 Description: 99213 - WOUND CARE VISIT-LEV 3 EST PT Modifier: Quantity: 1 Physician Procedures CPT4 Code: 3419379 Description: 02409 - WC PHYS LEVEL 3 - EST PT Modifier: Quantity: 1 CPT4 Code: Description: ICD-10 Diagnosis Description I83.028 Varicose veins of left lower extremity with ulcer other part of lower l I89.0 Lymphedema, not elsewhere classified L97.822 Non-pressure chronic ulcer of other part of left lower leg with fat lay E66.01  Morbid (severe) obesity due to excess calories Modifier: eg er exposed Quantity: Electronic Signature(s) Signed: 11/24/2021 4:53:34 PM By: [name] Keeler PA-C Entered By: [name] on 11/24/2021 16:53:33

## 2021-11-29 NOTE — Progress Notes (Signed)
ELVINA, BOSCH ([id number]) Visit Report for 11/24/2021 Arrival Information Details Patient Name: [name], [name]. Date of Service: 11/24/2021 1:30 PM Medical Record Number: [id number] Patient Account Number: [id number] Date of Birth/Sex: 1986-04-12 (36 y.o. F) Treating RN: Cornell Barman Primary Care Amirra Herling: [name] Other Clinician: [name] Referring Brailey Buescher: [name] Treating Kyrstyn Greear/Extender: Skipper Cliche in Treatment: 4 Visit Information History Since Last Visit All ordered tests and consults were completed: No Patient Arrived: Ambulatory Added or deleted any medications: No Arrival Time: 13:55 Any new allergies or adverse reactions: No Transfer Assistance: None Had a fall or experienced change in No Patient Requires Transmission-Based Precautions: No activities of daily living that may affect Patient Has Alerts: No risk of falls: Hospitalized since last visit: No Pain Present Now: Yes Electronic Signature(s) Signed: 11/24/2021 4:28:06 PM By: [name] Entered By: [name] on 11/24/2021 13:55:22 Kiker, Herminio Heads ([id number]) -------------------------------------------------------------------------------- Clinic Level of Care Assessment Details Patient Name: [name], [name] Date of Service: 11/24/2021 1:30 PM Medical Record Number: [id number] Patient Account Number: [id number] Date of Birth/Sex: July 14, 1985 (36 y.o. F) Treating RN: Cornell Barman Primary Care Adaira Centola: [name] Other Clinician: [name] Referring Shanikqua Zarzycki: [name] Treating Elexus Barman/Extender: Skipper Cliche in Treatment: 4 Clinic Level of Care Assessment Items TOOL 4 Quantity Score []  - Use when only an EandM is performed on FOLLOW-UP visit 0 ASSESSMENTS - Nursing Assessment / Reassessment X - Reassessment of Co-morbidities (includes updates in patient status) 1 10 X- 1 5 Reassessment of Adherence to Treatment Plan ASSESSMENTS - Wound and  Skin Assessment / Reassessment X - Simple Wound Assessment / Reassessment - one wound 1 5 []  - 0 Complex Wound Assessment / Reassessment - multiple wounds []  - 0 Dermatologic / Skin Assessment (not related to wound area) ASSESSMENTS - Focused Assessment []  - Circumferential Edema Measurements - multi extremities 0 []  - 0 Nutritional Assessment / Counseling / Intervention []  - 0 Lower Extremity Assessment (monofilament, tuning fork, pulses) []  - 0 Peripheral Arterial Disease Assessment (using hand held doppler) ASSESSMENTS - Ostomy and/or Continence Assessment and Care []  - Incontinence Assessment and Management 0 []  - 0 Ostomy Care Assessment and Management (repouching, etc.) PROCESS - Coordination of Care X - Simple Patient / Family Education for ongoing care 1 15 []  - 0 Complex (extensive) Patient / Family Education for ongoing care []  - 0 Staff obtains Programmer, systems, Records, Test Results / Process Orders []  - 0 Staff telephones HHA, Nursing Homes / Clarify orders / etc []  - 0 Routine Transfer to another Facility (non-emergent condition) []  - 0 Routine Hospital Admission (non-emergent condition) []  - 0 New Admissions / Biomedical engineer / Ordering NPWT, Apligraf, etc. []  - 0 Emergency Hospital Admission (emergent condition) X- 1 10 Simple Discharge Coordination []  - 0 Complex (extensive) Discharge Coordination PROCESS - Special Needs []  - Pediatric / Minor Patient Management 0 []  - 0 Isolation Patient Management []  - 0 Hearing / Language / Visual special needs []  - 0 Assessment of Community assistance (transportation, D/C planning, etc.) []  - 0 Additional assistance / Altered mentation []  - 0 Support Surface(s) Assessment (bed, cushion, seat, etc.) INTERVENTIONS - Wound Cleansing / Measurement Haaland, Syrena J. ([id number]) X- 1 5 Simple Wound Cleansing - one wound []  - 0 Complex Wound Cleansing - multiple wounds X- 1 5 Wound Imaging (photographs - any  number of wounds) []  - 0 Wound Tracing (instead of photographs) X- 1 5 Simple Wound Measurement - one wound []  - 0 Complex Wound Measurement -  multiple wounds INTERVENTIONS - Wound Dressings []  - Small Wound Dressing one or multiple wounds 0 []  - 0 Medium Wound Dressing one or multiple wounds X- 1 20 Large Wound Dressing one or multiple wounds X- 1 5 Application of Medications - topical []  - 0 Application of Medications - injection INTERVENTIONS - Miscellaneous []  - External ear exam 0 []  - 0 Specimen Collection (cultures, biopsies, blood, body fluids, etc.) []  - 0 Specimen(s) / Culture(s) sent or taken to Lab for analysis []  - 0 Patient Transfer (multiple staff / Harrel Lemon Lift / Similar devices) []  - 0 Simple Staple / Suture removal (25 or less) []  - 0 Complex Staple / Suture removal (26 or more) []  - 0 Hypo / Hyperglycemic Management (close monitor of Blood Glucose) []  - 0 Ankle / Brachial Index (ABI) - do not check if billed separately X- 1 5 Vital Signs Has the patient been seen at the hospital within the last three years: Yes Total Score: 90 Level Of Care: New/Established - Level 3 Electronic Signature(s) Signed: 11/24/2021 4:28:06 PM By: [name] Entered By: [name] on 11/24/2021 14:18:34 Allerton, Herminio Heads ([id number]) -------------------------------------------------------------------------------- Encounter Discharge Information Details Patient Name: [name], [name]. Date of Service: 11/24/2021 1:30 PM Medical Record Number: [id number] Patient Account Number: [id number] Date of Birth/Sex: 1985-07-30 (36 y.o. F) Treating RN: Cornell Barman Primary Care Saksham Akkerman: [name] Other Clinician: [name] Referring Kazi Montoro: [name] Treating Burhanuddin Kohlmann/Extender: Skipper Cliche in Treatment: 4 Encounter Discharge Information Items Discharge Condition: Stable Ambulatory Status: Ambulatory Discharge Destination: Home Transportation:  Private Auto Accompanied By: self Schedule Follow-up Appointment: Yes Clinical Summary of Care: Electronic Signature(s) Signed: 11/24/2021 4:28:06 PM By: [name] Entered By: [name] on 11/24/2021 14:29:44 Blyden, Herminio Heads ([id number]) -------------------------------------------------------------------------------- Lower Extremity Assessment Details Patient Name: [name], [name]. Date of Service: 11/24/2021 1:30 PM Medical Record Number: [id number] Patient Account Number: [id number] Date of Birth/Sex: [birth year] (36 y.o. F) Treating RN: Cornell Barman Primary Care Classie Weng: [name] Other Clinician: [name] Referring Sally-Ann Cutbirth: [name] Treating Demontray Franta/Extender: Skipper Cliche in Treatment: 4 Edema Assessment Assessed: [Left: Yes] [Right: No] Edema: [Left: Ye] [Right: s] Calf Left: Right: Point of Measurement: 31 cm From Medial Instep 57.5 cm Ankle Left: Right: Point of Measurement: 10 cm From Medial Instep 33.4 cm Vascular Assessment Pulses: Dorsalis Pedis Palpable: [Left:Yes] Electronic Signature(s) Signed: 11/24/2021 4:28:06 PM By: [name] Signed: 11/27/2021 5:27:45 PM By: Gretta Cool, [name], RN, CWS, [name], [name] Entered By: [name] on 11/24/2021 14:11:13 Renault, Herminio Heads ([id number]) -------------------------------------------------------------------------------- Multi Wound Chart Details Patient Name: [name], [name]. Date of Service: 11/24/2021 1:30 PM Medical Record Number: [id number] Patient Account Number: [id number] Date of Birth/Sex: November 26, 1985 (36 y.o. F) Treating RN: Cornell Barman Primary Care Ranyia Witting: [name] Other Clinician: [name] Referring Krisi Azua: [name] Treating Zahirah Cheslock/Extender: Skipper Cliche in Treatment: 4 Vital Signs Height(in): 66 Pulse(bpm): 89 Weight(lbs): 400 Blood Pressure(mmHg): 121/89 Body Mass Index(BMI): 64.6 Temperature(F): 98.2 Respiratory Rate(breaths/min):  16 Photos: [N/A:N/A] Wound Location: Left, Medial Lower Leg N/A N/A Wounding Event: Gradually Appeared N/A N/A Primary Etiology: Venous Leg Ulcer N/A N/A Comorbid History: Hypertension, Peripheral Venous N/A N/A Disease Date Acquired: 10/17/2021 N/A N/A Weeks of Treatment: 4 N/A N/A Wound Status: Open N/A N/A Wound Recurrence: No N/A N/A Measurements L x W x D (cm) 0.6x0.8x0.2 N/A N/A Area (cm) : 0.377 N/A N/A Volume (cm) : 0.075 N/A N/A % Reduction in Area: 31.50% N/A N/A % Reduction in Volume: -36.40% N/A N/A Classification: Full  Thickness Without Exposed N/A N/A Support Structures Exudate Amount: Medium N/A N/A Exudate Type: Serosanguineous N/A N/A Exudate Color: red, brown N/A N/A Granulation Amount: Medium (34-66%) N/A N/A Granulation Quality: Red N/A N/A Necrotic Amount: Medium (34-66%) N/A N/A Exposed Structures: Fat Layer (Subcutaneous Tissue): N/A N/A Yes Fascia: No Tendon: No Muscle: No Joint: No Bone: No Epithelialization: None N/A N/A Treatment Notes Electronic Signature(s) Signed: 11/24/2021 4:28:06 PM By: [name] Entered By: [name] on 11/24/2021 14:11:43 Nola, Herminio Heads ([id number]) -------------------------------------------------------------------------------- Jones Details Patient Name: [name], [name]. Date of Service: 11/24/2021 1:30 PM Medical Record Number: [id number] Patient Account Number: [id number] Date of Birth/Sex: [birth year] (36 y.o. F) Treating RN: Cornell Barman Primary Care Elika Godar: [name] Other Clinician: [name] Referring Keilyn Nadal: [name] Treating Aldene Hendon/Extender: Skipper Cliche in Treatment: 4 Active Inactive Wound/Skin Impairment Nursing Diagnoses: Knowledge deficit related to ulceration/compromised skin integrity Goals: Patient/caregiver will verbalize understanding of skin care regimen Date Initiated: 10/27/2021 Target Resolution Date: 11/27/2021 Goal Status:  Active Ulcer/skin breakdown will have a volume reduction of 30% by week 4 Date Initiated: 10/27/2021 Target Resolution Date: 12/28/2021 Goal Status: Active Ulcer/skin breakdown will have a volume reduction of 50% by week 8 Date Initiated: 10/27/2021 Target Resolution Date: 01/27/2022 Goal Status: Active Ulcer/skin breakdown will have a volume reduction of 80% by week 12 Date Initiated: 10/27/2021 Target Resolution Date: 02/27/2022 Goal Status: Active Ulcer/skin breakdown will heal within 14 weeks Date Initiated: 10/27/2021 Target Resolution Date: 03/29/2022 Goal Status: Active Interventions: Assess patient/caregiver ability to obtain necessary supplies Assess patient/caregiver ability to perform ulcer/skin care regimen upon admission and as needed Assess ulceration(s) every visit Notes: Electronic Signature(s) Signed: 11/24/2021 4:28:06 PM By: [name] Signed: 11/27/2021 5:27:45 PM By: Gretta Cool, [name], RN, CWS, [name], [name] Entered By: [name] on 11/24/2021 14:11:25 Lohmeyer, Herminio Heads ([id number]) -------------------------------------------------------------------------------- Pain Assessment Details Patient Name: [name], [name]. Date of Service: 11/24/2021 1:30 PM Medical Record Number: [id number] Patient Account Number: [id number] Date of Birth/Sex: 1985/12/06 (36 y.o. F) Treating RN: Cornell Barman Primary Care Derotha Fishbaugh: [name] Other Clinician: [name] Referring Estefany Goebel: [name] Treating Camillo Quadros/Extender: Skipper Cliche in Treatment: 4 Active Problems Location of Pain Severity and Description of Pain Patient Has Paino Yes Site Locations Pain Location: Pain in Ulcers Duration of the Pain. Constant / Intermittento Intermittent Rate the pain. Current Pain Level: 5 Character of Pain Describe the Pain: Burning, Throbbing, Other: tinging Pain Management and Medication Current Pain Management: Medication: Yes Rest: Yes Electronic  Signature(s) Signed: 11/24/2021 4:28:06 PM By: [name] Signed: 11/27/2021 5:27:45 PM By: Gretta Cool, [name], RN, CWS, [name], [name] Entered By: [name] on 11/24/2021 13:59:03 Kaczmarczyk, Herminio Heads ([id number]) -------------------------------------------------------------------------------- Patient/Caregiver Education Details Patient Name: [name], [name]. Date of Service: 11/24/2021 1:30 PM Medical Record Number: [id number] Patient Account Number: [id number] Date of Birth/Gender: 1985-05-04 (36 y.o. F) Treating RN: Cornell Barman Primary Care Physician: [name] Other Clinician: [name] Referring Physician: [name] Treating Physician/Extender: Skipper Cliche in Treatment: 4 Education Assessment Education Provided To: Patient Education Topics Provided Wound/Skin Impairment: Handouts: Other: continue wound care as directed Methods: Explain/Verbal Responses: State content correctly Electronic Signature(s) Signed: 11/24/2021 4:28:06 PM By: [name] Entered By: [name] on 11/24/2021 14:21:24 Shrader, Herminio Heads ([id number]) -------------------------------------------------------------------------------- Wound Assessment Details Patient Name: [name], [name]. Date of Service: 11/24/2021 1:30 PM Medical Record Number: [id number] Patient Account Number: [id number] Date of Birth/Sex: [birth year] (36 y.o. F) Treating RN: Cornell Barman Primary Care Rhylee Nunn: [name] Other Clinician:  [name] Referring Gennifer Potenza: [name] Treating Stephaie Dardis/Extender: Skipper Cliche in Treatment: 4 Wound Status Wound Number: 4 Primary Etiology: Venous Leg Ulcer Wound Location: Left, Medial Lower Leg Wound Status: Open Wounding Event: Gradually Appeared Comorbid History: Hypertension, Peripheral Venous Disease Date Acquired: 10/17/2021 Weeks Of Treatment: 4 Clustered Wound: No Photos Wound Measurements Length: (cm) 0.6 Width: (cm) 0.8 Depth: (cm)  0.2 Area: (cm) 0.377 Volume: (cm) 0.075 % Reduction in Area: 31.5% % Reduction in Volume: -36.4% Epithelialization: None Wound Description Classification: Full Thickness Without Exposed Support Structures Exudate Amount: Medium Exudate Type: Serosanguineous Exudate Color: red, brown Foul Odor After Cleansing: No Slough/Fibrino No Wound Bed Granulation Amount: Medium (34-66%) Exposed Structure Granulation Quality: Red Fascia Exposed: No Necrotic Amount: Medium (34-66%) Fat Layer (Subcutaneous Tissue) Exposed: Yes Necrotic Quality: Adherent Slough Tendon Exposed: No Muscle Exposed: No Joint Exposed: No Bone Exposed: No Treatment Notes Wound #4 (Lower Leg) Wound Laterality: Left, Medial Cleanser Byram Ancillary Kit - 15 Day Supply Discharge Instruction: Use supplies as instructed; Kit contains: (15) Saline Bullets; (15) 3x3 Gauze; 15 pr Gloves Soap and Water Discharge Instruction: Gently cleanse wound with antibacterial soap, rinse and pat dry prior to dressing wounds Sinyard, [name] ([id number]) Peri-Wound Care Topical Primary Dressing IODOFLEX 0.9% Cadexomer Iodine Pad Discharge Instruction: Apply Iodoflex to wound bed only as directed. Secondary Dressing (BORDER) Zetuvit Plus SILICONE BORDER Dressing 4x4 (in/in) Discharge Instruction: Please do not put silicone bordered dressings under wraps. Use non-bordered dressing only. Secured With Compression Wrap tubi grip Discharge Instruction: size E double layer Compression Stockings Add-Ons Electronic Signature(s) Signed: 11/24/2021 4:28:06 PM By: [name] Signed: 11/27/2021 5:27:45 PM By: Gretta Cool, [name], RN, CWS, [name], [name] Entered By: [name] on 11/24/2021 14:08:51 Zhao, Herminio Heads ([id number]) -------------------------------------------------------------------------------- Vitals Details Patient Name: [name], [name]. Date of Service: 11/24/2021 1:30 PM Medical Record Number: [id number] Patient Account  Number: [id number] Date of Birth/Sex: December 19, 1985 (36 y.o. F) Treating RN: Cornell Barman Primary Care Jeramyah Goodpasture: [name] Other Clinician: [name] Referring Aarion Kittrell: [name] Treating Maxima Skelton/Extender: Skipper Cliche in Treatment: 4 Vital Signs Time Taken: 13:56 Temperature (F): 98.2 Height (in): 66 Pulse (bpm): 89 Weight (lbs): 400 Respiratory Rate (breaths/min): 16 Body Mass Index (BMI): 64.6 Blood Pressure (mmHg): 121/89 Reference Range: 80 - 120 mg / dl Electronic Signature(s) Signed: 11/24/2021 4:28:06 PM By: [name] Entered By: [name] on 11/24/2021 13:58:59

## 2021-11-30 ENCOUNTER — Encounter: Payer: BC Managed Care – PPO | Admitting: Physician Assistant

## 2021-11-30 DIAGNOSIS — I83028 Varicose veins of left lower extremity with ulcer other part of lower leg: Secondary | ICD-10-CM | POA: Diagnosis not present

## 2021-12-02 NOTE — Progress Notes (Addendum)
[name] ([id number]) Visit Report for 11/30/2021 Chief Complaint Document Details Patient Name: [name], [name]. Date of Service: 11/30/2021 2:30 PM Medical Record Number: [id number] Patient Account Number: [id number] Date of Birth/Sex: Apr 27, 1985 (36 y.o. F) Treating RN: Alycia Rossetti Primary Care Provider: [name] Other Clinician: Referring Provider: [name] Treating Provider/Extender: Skipper Cliche in Treatment: 4 Information Obtained from: Patient Chief Complaint Left LE Ulcer Electronic Signature(s) Signed: 11/30/2021 3:16:34 PM By: Worthy Keeler PA-C Entered By: Worthy Keeler on 11/30/2021 15:16:34 [name], [name] ([id number]) -------------------------------------------------------------------------------- HPI Details Patient Name: [name], [name] Date of Service: 11/30/2021 2:30 PM Medical Record Number: [id number] Patient Account Number: [id number] Date of Birth/Sex: 1985-10-25 (36 y.o. F) Treating RN: Alycia Rossetti Primary Care Provider: [name] Other Clinician: Referring Provider: [name] Treating Provider/Extender: Skipper Cliche in Treatment: 4 History of Present Illness HPI Description: 36 year old patient who started with having ulcerations on the right lower leg on the lateral part of her ankle for about 2 weeks. She was seen in the ER at [facility] and advised to see the wound care for a consultation. No X-rays of workup was done during the ER visit and no prescription for any medications of compression wraps were given. the patient is not diabetic but does have hypertension and her medications have been reviewed by me. 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 The right and left greater saphenous vein was found to be tortuous. Deep venous system  was also not competent and there was reflux of more than 500 ms. She was then seen by Dr. Sherren Mocha Early who recommended that the patient would not benefit from endovenous ablation and he had recommended vein stripping on the right side and multiple small phlebectomy procedures on the left side. the patient did not follow-up due to social economic reasons. She has not been wearing any compression stockings and has not taken any specific treatment for varicose veins for the last 3 years. 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 We have obtained some appointments to see the vascular surgeons by the end of the month and the patient would like to follow up with me at my [facility] on Wednesday, June 29. 10/14/2014 -- she could not see me yesterday in [place] and hence has come for a review today. She has a vascular workup to be done this afternoon at [facility]. She is doing fine otherwise. 10/22/2014 -- she was seen by Dr. Curt Jews and he has recommended surgical removal of her right saphenous vein from distal thigh to saphenofemoral junction and stab phlebectomy's of multiple large tributary branches throughout her thigh and calf. This would be done under general anesthesia in the outpatient setting. 10/29/2014 -- she is trying to work on a surgical date and in the meanwhile we have got insurance clearance for Apligraf and we will start this next week. 7/22 2016 -- she is here for the first application of Apligraf. 11/19/2014 -- she is here for a second application of Apligraf 11/26/2014 -- she has done fine after her last application of Apligraf and is awaiting her surgery which is scheduled for August 31. 12/03/2014 -- she is doing fine and is here for her third application  of Apligraf. 12/21/2014 -- She had surgery on 12/15/2014 by Dr.Early who did #1 ligation and stripping of right  great saphenous vein from distal thigh to saphenofemoral junction, #2 stab phlebectomy of large tributary varicose veins in the thigh popliteal space and calf. She had an Ace wrap up to her groin and this was removed today and the Unna's boot was also removed. 12/28/2014 -- she is here for her fourth application of Apligraf. 01/06/2015 - he saw her vascular surgeon Dr. Donnetta Hutching who was pleased with her progress and he has confirmed that no surgical procedures could be attempted on the left side. 01/13/2015 -- her wound looks very good and she's been having no problems whatsoever. Readmission: 07/26/2020 upon evaluation today patient presents for initial inspection here in our clinic for a new issue with her left leg although she is previously been seen due to issues with the right leg back in 2016. At that time she was seeing Dr. Donnetta Hutching who is a vein/vascular specialist in [place]. He has since semiretired from what I understand. He is working out of [employer] I believe. Nonetheless she tells me at the time that there was really nothing to do for her left leg although the right leg was where they did most of the work. Subsequently she states that she is done fairly well until just in the past week where she had issues with bleeding from what appears to be varicose vein on the left leg medially. Unfortunately this has continued to be an issue although she tells me at first it was coming much more significantly Down quite a bit but nonetheless has not completely resolved. Every time she showers she notices that it starts to drain a little bit more. She does have a history of chronic venous insufficiency, lymphedema, varicose veins bilaterally, and obesity. 08/02/2020 upon evaluation today patient appears to be doing about the same in regards to the ulcer on her left leg. She has some eschar covering there is definitely some fluid collecting underneath unfortunately. With that being said she tells me she  is still having a tremendous amount of pain therefore she is really not able to allow me to clean this off very effectively to be perfectly honest. I think we need to try to soften this up 08/16/2020 upon evaluation today patient's wound is really not doing significantly better not really states about the same. She notes that the wrap just does not seem to be staying up very well at all unfortunately. No fevers, chills, nausea, vomiting, or diarrhea. She did cut it off once it starts to slide in order to alleviate some of the pressure from sliding Down. Fortunately there is no signs of active infection at this time which is great news. 08/23/2020 upon evaluation today patient appears to be doing well 08/23/2020 upon evaluation today patient appears to be doing well with regard to her wound all things considered. Fortunately there does not appear to be any signs of active infection at this time which is great news. She has been tolerating the dressing changes without complication and overall I am extremely pleased with where things stand at this point. She does have her appointment with vascular in [facility] on June 9. [name], [name] ([id number]) 08/30/2020 upon evaluation today patient actually appears to be doing decently well in regard to her wound. Fortunately there is no signs of active infection which is great news. Nonetheless I do believe that the patient is going require little bit of debridement if  she is okay with me attempting that today I think that will help clean off some of the necrotic tissue. Fortunately there does not appear to be otherwise any evidence of active infection which is also great news. 09/19/2020 upon evaluation today patient appears to be doing a little better in regard to her wound as compared to previous. Fortunately there does not appear to be any signs of active infection overall. No fever chills noted. I do believe that the Iodosorb has made this a little bit  better with regard to the overall size and appearance of the wound bed though again she does still have quite a ways to go to get this to heal she still very tender to touch. 09/27/2020 upon evaluation today patient appears to actually be doing quite well with regard to her wound. This is measuring much smaller which is great news. With that being said she did see vein and vascular in [facility] and they subsequently recommended that surgery is really what she probably needs to go forward with sounds like the potential for venous ablation. With that being said the patient tells me this is just not the right time for her to be able to proceed with any type of surgery which I completely understand. Nonetheless I do believe that she would continue to benefit from compression but again that is really not something that she is able to easily do. 10/04/2020 upon evaluation today patient appears to be doing about the same in regard to her wound. This is measuring a little bit smaller but nonetheless still is open and again has some slough and biofilm noted on the surface of the wound. There does not appear to be any signs of active infection which is great news. No fevers, chills, nausea, vomiting, or diarrhea. 10/04/2020 upon evaluation today patient appears to be doing well with regard to her wound. She has been tolerating dressing changes without complication. Fortunately there does not appear to be any signs of active infection which is great news. No fevers, chills, nausea, vomiting, or diarrhea. 10/25/2020 upon evaluation today patient appears to be doing well with regard to her wound. She has been tolerating the dressing changes without complication. Fortunately there is no signs of active infection at this time. No fevers, chills, nausea, vomiting, or diarrhea. 11/01/2020 upon evaluation today patient with regard to her wound. She has been tolerating the dressing changes without complication. Fortunately  there does not appear to be any signs of infection which is great news. No fever chills noted 11/15/2020 upon evaluation today patient appears to be doing well with regard to her wound. Fortunately there is no signs of active infection at this time. No fevers, chills, nausea, vomiting, or diarrhea. With that being said she continues to have a significant amount of pain at the site even though this is very close to complete closure. She also had several varicose veins around the area which were also problematic. Overall however I feel like the patient is making excellent progress. 11/28/2020 upon evaluation today patient appears to be doing well with regard to her leg ulcer. Again were not really able to debride or compression wrap her due to discomfort and pain. She does not allow for that. With that being said we have been using Iodosorb which does seem to be doing decently well. Fortunately there is no signs of active infection at this time which is great news. No fevers, chills, nausea, vomiting, or diarrhea. 8/31; patient presents for 2-week follow-up. She  has been using Iodosorb. She reports that the wound is closed. She denies signs of infection. Readmission: 10-27-2021 upon evaluation this is a patient that presents today whom I have previously seen this is pretty much for the same issue though I think a little bit higher than the last time I saw her. She does have a history of chronic venous insufficiency and hypertension along with varicose veins. Subsequently she does have an ulceration which spontaneously ruptured she has not been wearing any compression which I think is a big part of the issue here. We discussed this before I really think she probably needs to be wearing her compression therapy, she probably needs lymphedema pumps if she can ever wear the compression for a significant amount of time to get these, and subsequently also think that she needs to be elevating her legs is much as  possible she may even need some vascular intervention in regard to her veins. All of this was reiterated and discussed with her today to reinforce what needs to happen in order to ensure that her legs do not get a lot worse. The patient voiced understanding. She tells me that she knows because she is seeing her mom go through a lot of this as well how bad things can get. 11-03-2021 upon evaluation today patient appears to be doing well with regard to her wound. Fortunately there does not appear to be any signs of active infection at this time. She is measuring a little bit bigger but I think this is because the wound is actually cleaning up a bit here. 7/27; left lateral leg wound not any smaller but perhaps with a cleaner surface. She is using Iodoflex to help with the latter and using Tubigrip. She has chronic venous insufficiency with secondary lymphedema. She is apparently followed by vein and vascular and is being scheduled for an ablation 11-17-2021 upon evaluation today patient appears to be doing well with regard to her wound this is actually showing signs of improvement which is great news. Fortunately I do not see any evidence of active infection locally or systemically at this time which is great news. No fevers, chills, nausea, vomiting, or diarrhea. 11-24-2021 upon evaluation today patient's wound is actually showing signs of significant improvement. Unfortunately she had quite a bit of pain with debridement last week I do believe it was beneficial but at the same time she is doing much better but still really does not want this debrided again I think being that it is looking a whole lot better I would try to avoid that today especially since it caused her so much discomfort that is really not the goal and I explained that to the patient today she voiced understanding and knows that it needed to be done but still states that it was quite painful. 11-30-2021 upon evaluation today patient  appears to be doing excellent in regard to her wound this is actually showing signs of excellent improvement I am very pleased with where things stand. She does have her venous ablation appointment for October 17. Electronic Signature(s) Signed: 11/30/2021 3:33:15 PM By: Worthy Keeler PA-C Entered By: Worthy Keeler on 11/30/2021 15:33:15 [name], [name] ([id number]) -------------------------------------------------------------------------------- Physical Exam Details Patient Name: [name], [name]. Date of Service: 11/30/2021 2:30 PM Medical Record Number: [id number] Patient Account Number: [id number] Date of Birth/Sex: 08-31-1985 (36 y.o. F) Treating RN: Alycia Rossetti Primary Care Provider: [name] Other Clinician: Referring Provider: [name] Treating Provider/Extender: Skipper Cliche in Treatment:  4 Constitutional Well-nourished and well-hydrated in no acute distress. Respiratory normal breathing without difficulty. Psychiatric this patient is able to make decisions and demonstrates good insight into disease process. Alert and Oriented x 3. pleasant and cooperative. Notes Patient's wound bed actually is showing signs of significant improvement which is great news and overall I am extremely pleased with where we stand today. Electronic Signature(s) Signed: 11/30/2021 3:36:12 PM By: Worthy Keeler PA-C Entered By: Worthy Keeler on 11/30/2021 15:36:12 Bergin, [name] ([id number]) -------------------------------------------------------------------------------- Physician Orders Details Patient Name: [name], [name] Date of Service: 11/30/2021 2:30 PM Medical Record Number: [id number] Patient Account Number: [id number] Date of Birth/Sex: Sep 13, 1985 (36 y.o. F) Treating RN: Alycia Rossetti Primary Care Provider: [name] Other Clinician: Referring Provider: [name] Treating Provider/Extender: Skipper Cliche in Treatment: 4 Verbal / Phone  Orders: No Diagnosis Coding ICD-10 Coding Code Description I83.028 Varicose veins of left lower extremity with ulcer other part of lower leg I89.0 Lymphedema, not elsewhere classified L97.822 Non-pressure chronic ulcer of other part of left lower leg with fat layer exposed E66.01 Morbid (severe) obesity due to excess calories Follow-up Appointments o Return Appointment in 1 week. Bathing/ Shower/ Hygiene o May shower; gently cleanse wound with antibacterial soap, rinse and pat dry prior to dressing wounds Anesthetic (Use 'Patient Medications' Section for Anesthetic Order Entry) o Lidocaine applied to wound bed Edema Control - Lymphedema / Segmental Compressive Device / Other o Tubigrip double layer applied - size E left leg o Elevate, Exercise Daily and Avoid Standing for Long Periods of Time. o Elevate legs to the level of the heart and pump ankles as often as possible o Elevate leg(s) parallel to the floor when sitting. Wound Treatment Wound #4 - Lower Leg Wound Laterality: Left, Medial Cleanser: Byram Ancillary Kit - 15 Day Supply (Generic) 3 x Per Week/30 Days Discharge Instructions: Use supplies as instructed; Kit contains: (15) Saline Bullets; (15) 3x3 Gauze; 15 pr Gloves Cleanser: Soap and Water 3 x Per Week/30 Days Discharge Instructions: Gently cleanse wound with antibacterial soap, rinse and pat dry prior to dressing wounds Primary Dressing: IODOFLEX 0.9% Cadexomer Iodine Pad 3 x Per Week/30 Days Discharge Instructions: Apply Iodoflex to wound bed only as directed. Secondary Dressing: (BORDER) Zetuvit Plus SILICONE BORDER Dressing 4x4 (in/in) (Generic) 3 x Per Week/30 Days Discharge Instructions: Please do not put silicone bordered dressings under wraps. Use non-bordered dressing only. Compression Wrap: tubi grip 3 x Per Week/30 Days Discharge Instructions: size E double layer Electronic Signature(s) Signed: 11/30/2021 3:39:46 PM By: Alycia Rossetti Signed:  11/30/2021 5:21:19 PM By: Worthy Keeler PA-C Entered By: Alycia Rossetti on 11/30/2021 15:37:04 [name], [name] ([id number]) -------------------------------------------------------------------------------- Problem List Details Patient Name: [name], [name]. Date of Service: 11/30/2021 2:30 PM Medical Record Number: [id number] Patient Account Number: [id number] Date of Birth/Sex: [birth year] (36 y.o. F) Treating RN: Alycia Rossetti Primary Care Provider: [name] Other Clinician: Referring Provider: [name] Treating Provider/Extender: Skipper Cliche in Treatment: 4 Active Problems ICD-10 Encounter Code Description Active Date MDM Diagnosis I83.028 Varicose veins of left lower extremity with ulcer other part of lower leg 10/27/2021 No Yes I89.0 Lymphedema, not elsewhere classified 10/27/2021 No Yes L97.822 Non-pressure chronic ulcer of other part of left lower leg with fat layer 10/27/2021 No Yes exposed E66.01 Morbid (severe) obesity due to excess calories 10/27/2021 No Yes Inactive Problems Resolved Problems Electronic Signature(s) Signed: 11/30/2021 3:16:29 PM By: Worthy Keeler PA-C Entered By: Worthy Keeler on 11/30/2021 15:16:29 [name], [name] Bison  Lenna Sciara ([id number]) -------------------------------------------------------------------------------- Progress Note Details Patient Name: [name], [name]. Date of Service: 11/30/2021 2:30 PM Medical Record Number: [id number] Patient Account Number: [id number] Date of Birth/Sex: 09-07-1985 (36 y.o. F) Treating RN: Alycia Rossetti Primary Care Provider: [name] Other Clinician: Referring Provider: [name] Treating Provider/Extender: Skipper Cliche in Treatment: 4 Subjective Chief Complaint Information obtained from Patient Left LE Ulcer History of Present Illness (HPI) 36 year old patient who started with having ulcerations on the right lower leg on the lateral part of her ankle for about 2 weeks. She was  seen in the ER at [facility] and advised to see the wound care for a consultation. No X-rays of workup was done during the ER visit and no prescription for any medications of compression wraps were given. the patient is not diabetic but does have hypertension and her medications have been reviewed by me. 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 The right and left greater saphenous vein was found to be tortuous. Deep venous system was also not competent and there was reflux of more than 500 ms. She was then seen by Dr. Sherren Mocha Early who recommended that the patient would not benefit from endovenous ablation and he had recommended vein stripping on the right side and multiple small phlebectomy procedures on the left side. the patient did not follow-up due to social economic reasons. She has not been wearing any compression stockings and has not taken any specific treatment for varicose veins for the last 3 years. 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 We have obtained some appointments to see the vascular surgeons by the end of the month and the patient would like to follow up with me at my [facility] on Wednesday, June 29. 10/14/2014 -- she could not see me yesterday in [place] and hence has come for a review today. She has a vascular workup to be done this afternoon at [facility]. She is doing fine otherwise. 10/22/2014 -- she was seen by Dr. Curt Jews and he has recommended surgical removal of her right saphenous vein from distal thigh to saphenofemoral junction and stab phlebectomy's of multiple large tributary branches throughout her thigh and calf. This would be done under general anesthesia in the outpatient  setting. 10/29/2014 -- she is trying to work on a surgical date and in the meanwhile we have got insurance clearance for Apligraf and we will start this next week. 7/22 2016 -- she is here for the first application of Apligraf. 11/19/2014 -- she is here for a second application of Apligraf 11/26/2014 -- she has done fine after her last application of Apligraf and is awaiting her surgery which is scheduled for August 31. 12/03/2014 -- she is doing fine and is here for her third application of Apligraf. 12/21/2014 -- She had surgery on 12/15/2014 by Dr.Early who did #1 ligation and stripping of right great saphenous vein from distal thigh to saphenofemoral junction, #2 stab phlebectomy of large tributary varicose veins in the thigh popliteal space and calf. She had an Ace wrap up to her groin and this was removed today and the Unna's boot was also removed. 12/28/2014 -- she is here for her fourth application of Apligraf. 01/06/2015 - he saw her  vascular surgeon Dr. Donnetta Hutching who was pleased with her progress and he has confirmed that no surgical procedures could be attempted on the left side. 01/13/2015 -- her wound looks very good and she's been having no problems whatsoever. Readmission: 07/26/2020 upon evaluation today patient presents for initial inspection here in our clinic for a new issue with her left leg although she is previously been seen due to issues with the right leg back in 2016. At that time she was seeing Dr. Donnetta Hutching who is a vein/vascular specialist in [place]. He has since semiretired from what I understand. He is working out of [employer] I believe. Nonetheless she tells me at the time that there was really nothing to do for her left leg although the right leg was where they did most of the work. Subsequently she states that she is done fairly well until just in the past week where she had issues with bleeding from what appears to be varicose vein on the left leg medially.  Unfortunately this has continued to be an issue although she tells me at first it was coming much more significantly Down quite a bit but nonetheless has not completely resolved. Every time she showers she notices that it starts to drain a little bit more. She does have a history of chronic venous insufficiency, lymphedema, varicose veins bilaterally, and obesity. 08/02/2020 upon evaluation today patient appears to be doing about the same in regards to the ulcer on her left leg. She has some eschar covering there is definitely some fluid collecting underneath unfortunately. With that being said she tells me she is still having a tremendous amount of pain therefore she is really not able to allow me to clean this off very effectively to be perfectly honest. I think we need to try to soften this up 08/16/2020 upon evaluation today patient's wound is really not doing significantly better not really states about the same. She notes that the wrap just does not seem to be staying up very well at all unfortunately. No fevers, chills, nausea, vomiting, or diarrhea. She did cut it off once it starts to slide in order to alleviate some of the pressure from sliding Down. Fortunately there is no signs of active infection at this time which is great news. [name], [name] ([id number]) 08/23/2020 upon evaluation today patient appears to be doing well 08/23/2020 upon evaluation today patient appears to be doing well with regard to her wound all things considered. Fortunately there does not appear to be any signs of active infection at this time which is great news. She has been tolerating the dressing changes without complication and overall I am extremely pleased with where things stand at this point. She does have her appointment with vascular in [facility] on June 9. 08/30/2020 upon evaluation today patient actually appears to be doing decently well in regard to her wound. Fortunately there is no signs of  active infection which is great news. Nonetheless I do believe that the patient is going require little bit of debridement if she is okay with me attempting that today I think that will help clean off some of the necrotic tissue. Fortunately there does not appear to be otherwise any evidence of active infection which is also great news. 09/19/2020 upon evaluation today patient appears to be doing a little better in regard to her wound as compared to previous. Fortunately there does not appear to be any signs of active infection overall. No fever chills noted. I do believe that  the Iodosorb has made this a little bit better with regard to the overall size and appearance of the wound bed though again she does still have quite a ways to go to get this to heal she still very tender to touch. 09/27/2020 upon evaluation today patient appears to actually be doing quite well with regard to her wound. This is measuring much smaller which is great news. With that being said she did see vein and vascular in [facility] and they subsequently recommended that surgery is really what she probably needs to go forward with sounds like the potential for venous ablation. With that being said the patient tells me this is just not the right time for her to be able to proceed with any type of surgery which I completely understand. Nonetheless I do believe that she would continue to benefit from compression but again that is really not something that she is able to easily do. 10/04/2020 upon evaluation today patient appears to be doing about the same in regard to her wound. This is measuring a little bit smaller but nonetheless still is open and again has some slough and biofilm noted on the surface of the wound. There does not appear to be any signs of active infection which is great news. No fevers, chills, nausea, vomiting, or diarrhea. 10/04/2020 upon evaluation today patient appears to be doing well with regard to her  wound. She has been tolerating dressing changes without complication. Fortunately there does not appear to be any signs of active infection which is great news. No fevers, chills, nausea, vomiting, or diarrhea. 10/25/2020 upon evaluation today patient appears to be doing well with regard to her wound. She has been tolerating the dressing changes without complication. Fortunately there is no signs of active infection at this time. No fevers, chills, nausea, vomiting, or diarrhea. 11/01/2020 upon evaluation today patient with regard to her wound. She has been tolerating the dressing changes without complication. Fortunately there does not appear to be any signs of infection which is great news. No fever chills noted 11/15/2020 upon evaluation today patient appears to be doing well with regard to her wound. Fortunately there is no signs of active infection at this time. No fevers, chills, nausea, vomiting, or diarrhea. With that being said she continues to have a significant amount of pain at the site even though this is very close to complete closure. She also had several varicose veins around the area which were also problematic. Overall however I feel like the patient is making excellent progress. 11/28/2020 upon evaluation today patient appears to be doing well with regard to her leg ulcer. Again were not really able to debride or compression wrap her due to discomfort and pain. She does not allow for that. With that being said we have been using Iodosorb which does seem to be doing decently well. Fortunately there is no signs of active infection at this time which is great news. No fevers, chills, nausea, vomiting, or diarrhea. 8/31; patient presents for 2-week follow-up. She has been using Iodosorb. She reports that the wound is closed. She denies signs of infection. Readmission: 10-27-2021 upon evaluation this is a patient that presents today whom I have previously seen this is pretty much for the same  issue though I think a little bit higher than the last time I saw her. She does have a history of chronic venous insufficiency and hypertension along with varicose veins. Subsequently she does have an ulceration which spontaneously ruptured she has  not been wearing any compression which I think is a big part of the issue here. We discussed this before I really think she probably needs to be wearing her compression therapy, she probably needs lymphedema pumps if she can ever wear the compression for a significant amount of time to get these, and subsequently also think that she needs to be elevating her legs is much as possible she may even need some vascular intervention in regard to her veins. All of this was reiterated and discussed with her today to reinforce what needs to happen in order to ensure that her legs do not get a lot worse. The patient voiced understanding. She tells me that she knows because she is seeing her mom go through a lot of this as well how bad things can get. 11-03-2021 upon evaluation today patient appears to be doing well with regard to her wound. Fortunately there does not appear to be any signs of active infection at this time. She is measuring a little bit bigger but I think this is because the wound is actually cleaning up a bit here. 7/27; left lateral leg wound not any smaller but perhaps with a cleaner surface. She is using Iodoflex to help with the latter and using Tubigrip. She has chronic venous insufficiency with secondary lymphedema. She is apparently followed by vein and vascular and is being scheduled for an ablation 11-17-2021 upon evaluation today patient appears to be doing well with regard to her wound this is actually showing signs of improvement which is great news. Fortunately I do not see any evidence of active infection locally or systemically at this time which is great news. No fevers, chills, nausea, vomiting, or diarrhea. 11-24-2021 upon evaluation  today patient's wound is actually showing signs of significant improvement. Unfortunately she had quite a bit of pain with debridement last week I do believe it was beneficial but at the same time she is doing much better but still really does not want this debrided again I think being that it is looking a whole lot better I would try to avoid that today especially since it caused her so much discomfort that is really not the goal and I explained that to the patient today she voiced understanding and knows that it needed to be done but still states that it was quite painful. 11-30-2021 upon evaluation today patient appears to be doing excellent in regard to her wound this is actually showing signs of excellent improvement I am very pleased with where things stand. She does have her venous ablation appointment for October 17. [name], [name] ([id number]) Objective Constitutional Well-nourished and well-hydrated in no acute distress. Vitals Time Taken: 2:15 PM, Height: 66 in, Weight: 400 lbs, BMI: 64.6, Temperature: 98.6 F, Pulse: 84 bpm, Respiratory Rate: 18 breaths/min, Blood Pressure: 135/91 mmHg. Respiratory normal breathing without difficulty. Psychiatric this patient is able to make decisions and demonstrates good insight into disease process. Alert and Oriented x 3. pleasant and cooperative. General Notes: Patient's wound bed actually is showing signs of significant improvement which is great news and overall I am extremely pleased with where we stand today. Integumentary (Hair, Skin) Wound #4 status is Open. Original cause of wound was Gradually Appeared. The date acquired was: 10/17/2021. The wound has been in treatment 4 weeks. The wound is located on the Left,Medial Lower Leg. The wound measures 0.5cm length x 0.8cm width x 0.2cm depth; 0.314cm^2 area and 0.063cm^3 volume. There is Fat Layer (Subcutaneous Tissue) exposed. There  is a medium amount of serosanguineous drainage noted.  There is medium (34-66%) red granulation within the wound bed. There is a medium (34-66%) amount of necrotic tissue within the wound bed including Adherent Slough. 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 Bathing/ Shower/ Hygiene: May shower; gently cleanse wound with antibacterial soap, rinse and pat dry prior to dressing wounds Anesthetic (Use 'Patient Medications' Section for Anesthetic Order Entry): Lidocaine applied to wound bed Edema Control - Lymphedema / Segmental Compressive Device / Other: Tubigrip double layer applied - size E left leg Elevate, Exercise Daily and Avoid Standing for Long Periods of Time. Elevate legs to the level of the heart and pump ankles as often as possible Elevate leg(s) parallel to the floor when sitting. WOUND #4: - Lower Leg Wound Laterality: Left, Medial Cleanser: Byram Ancillary Kit - 15 Day Supply (Generic) 3 x Per Week/30 Days Discharge Instructions: Use supplies as instructed; Kit contains: (15) Saline Bullets; (15) 3x3 Gauze; 15 pr Gloves Cleanser: Soap and Water 3 x Per Week/30 Days Discharge Instructions: Gently cleanse wound with antibacterial soap, rinse and pat dry prior to dressing wounds Primary Dressing: IODOFLEX 0.9% Cadexomer Iodine Pad 3 x Per Week/30 Days Discharge Instructions: Apply Iodoflex to wound bed only as directed. Secondary Dressing: (BORDER) Zetuvit Plus SILICONE BORDER Dressing 4x4 (in/in) (Generic) 3 x Per Week/30 Days Discharge Instructions: Please do not put silicone bordered dressings under wraps. Use non-bordered dressing only. Compression Wrap: tubi grip 3 x Per Week/30 Days Discharge Instructions: size E double  layer TIMA, CURET. ([id number]) 1. I am going to suggest that we go ahead and continue with the wound care measures as before. I do believe that the patient seems to be making great progress and I am very pleased with where things stand today. This includes the Iodosorb which I think is doing a good job. 2. I am going to recommend as well that the patient continue to monitor for any signs of worsening infection right now were not seeing anything she is using a bordered foam dressing to cover and then Tubigrip to help with edema control. 3. The patient does have her venous ablation set up for October 17. Hopefully this is good to help to prevent this from reoccurring she may have to be healed by that time. We will see patient back for reevaluation in 1 week here in the clinic. If anything worsens or changes patient will contact our office for additional recommendations. Electronic Signature(s) Signed: 11/30/2021 3:37:27 PM By: Worthy Keeler PA-C Entered By: Worthy Keeler on 11/30/2021 15:37:26 Everding, [name] ([id number]) -------------------------------------------------------------------------------- SuperBill Details Patient Name: MARNAE, MADANI Date of Service: 11/30/2021 Medical Record Number: [id number] Patient Account Number: [id number] Date of Birth/Sex: [birth year] (36 y.o. F) Treating RN: Alycia Rossetti Primary Care Provider: [name] Other Clinician: Referring Provider: [name] Treating Provider/Extender: Skipper Cliche in Treatment: 4 Diagnosis Coding ICD-10 Codes Code Description I83.028 Varicose veins of left lower extremity with ulcer other part of lower leg I89.0 Lymphedema, not elsewhere classified L97.822 Non-pressure chronic ulcer of other part of left lower leg with fat layer exposed E66.01 Morbid (severe) obesity due to excess calories Facility Procedures CPT4 Code: [id number] Description: 99213 - WOUND CARE VISIT-LEV 3 EST  PT Modifier: Quantity: 1 Physician Procedures CPT4 Code: 4718550 Description: 15868 - WC PHYS LEVEL 3 - EST PT Modifier: Quantity: 1 CPT4 Code: Description: ICD-10 Diagnosis Description I83.028 Varicose veins of left lower extremity with ulcer other part of lower l I89.0 Lymphedema, not elsewhere classified L97.822 Non-pressure chronic ulcer of other part of left lower leg with fat lay E66.01  Morbid (severe) obesity due to excess calories Modifier: eg er exposed Quantity: Electronic Signature(s) Signed: 11/30/2021 5:19:05 PM By: Gretta Cool, [name], RN, CWS, [name], [name] Signed: 11/30/2021 5:21:19 PM By: Worthy Keeler PA-C Previous Signature: 11/30/2021 3:38:24 PM Version By: Alycia Rossetti Previous Signature: 11/30/2021 3:37:48 PM Version By: Worthy Keeler PA-C Entered By: Gretta Cool, [name], RN, CWS, Kim on 11/30/2021 17:19:04

## 2021-12-02 NOTE — Progress Notes (Signed)
[name], [name] ([id number]) Visit Report for 11/30/2021 Arrival Information Details Patient Name: [name], [name]. Date of Service: 11/30/2021 2:30 PM Medical Record Number: [id number] Patient Account Number: [id number] Date of Birth/Sex: 1986-02-02 (36 y.o. F) Treating RN: Alycia Rossetti Primary Care Jalyiah Shelley: [name] Other Clinician: Referring Kianah Harries: [name] Treating Rea Reser/Extender: Skipper Cliche in Treatment: 4 Visit Information History Since Last Visit Added or deleted any medications: No Patient Arrived: Ambulatory Any new allergies or adverse reactions: No Arrival Time: 15:01 Had a fall or experienced change in No Transfer Assistance: None activities of daily living that may affect Patient Requires Transmission-Based Precautions: No risk of falls: Patient Has Alerts: No Signs or symptoms of abuse/neglect since last visito No Hospitalized since last visit: No Has Dressing in Place as Prescribed: Yes Pain Present Now: No Electronic Signature(s) Signed: 11/30/2021 4:08:34 PM By: Alycia Rossetti Previous Signature: 11/30/2021 3:36:09 PM Version By: Alycia Rossetti Entered By: Alycia Rossetti on 11/30/2021 16:08:34 Glymph, Herminio Heads ([id number]) -------------------------------------------------------------------------------- Clinic Level of Care Assessment Details Patient Name: [name], [name] Date of Service: 11/30/2021 2:30 PM Medical Record Number: [id number] Patient Account Number: [id number] Date of Birth/Sex: [birth year] (36 y.o. F) Treating RN: Alycia Rossetti Primary Care Ashely Joshua: [name] Other Clinician: Referring Shakeerah Gradel: [name] Treating Manville Rico/Extender: Skipper Cliche in Treatment: 4 Clinic Level of Care Assessment Items TOOL 4 Quantity Score _0  - Use when only an EandM is performed on FOLLOW-UP visit 0 ASSESSMENTS - Nursing Assessment / Reassessment X - Reassessment of Co-morbidities (includes updates in patient status) 1  10 X- 1 5 Reassessment of Adherence to Treatment Plan ASSESSMENTS - Wound and Skin Assessment / Reassessment X - Simple Wound Assessment / Reassessment - one wound 1 5 _1  - 0 Complex Wound Assessment / Reassessment - multiple wounds _2  - 0 Dermatologic / Skin Assessment (not related to wound area) ASSESSMENTS - Focused Assessment _3  - Circumferential Edema Measurements - multi extremities 0 _4  - 0 Nutritional Assessment / Counseling / Intervention _5  - 0 Lower Extremity Assessment (monofilament, tuning fork, pulses) _6  - 0 Peripheral Arterial Disease Assessment (using hand held doppler) ASSESSMENTS - Ostomy and/or Continence Assessment and Care _7  - Incontinence Assessment and Management 0 _8  - 0 Ostomy Care Assessment and Management (repouching, etc.) PROCESS - Coordination of Care X - Simple Patient / Family Education for ongoing care 1 15 X- 1 20 Complex (extensive) Patient / Family Education for ongoing care _9  - 0 Staff obtains Programmer, systems, Records, Test Results / Process Orders _10  - 0 Staff telephones HHA, Nursing Homes / Clarify orders / etc _11  - 0 Routine Transfer to another Facility (non-emergent condition) _12  - 0 Routine Hospital Admission (non-emergent condition) _13  - 0 New Admissions / Biomedical engineer / Ordering NPWT, Apligraf, etc. _14  - 0 Emergency Hospital Admission (emergent condition) X- 1 10 Simple Discharge Coordination _15  - 0 Complex (extensive) Discharge Coordination PROCESS - Special Needs _16  - Pediatric / Minor Patient Management 0 _17  - 0 Isolation Patient Management _18  - 0 Hearing / Language / Visual special needs _19  - 0 Assessment of Community assistance (transportation, D/C planning, etc.) _20  - 0 Additional assistance / Altered mentation _21  - 0 Support Surface(s) Assessment (bed, cushion, seat, etc.) INTERVENTIONS - Wound Cleansing / Measurement Casseus, Domitila J. ([id number]) X- 1 5 Simple Wound Cleansing - one wound _22  -  0 Complex Wound Cleansing - multiple wounds X- 1 5 Wound Imaging (photographs - any number of wounds) _23  - 0 Wound Tracing (instead of photographs) X- 1  5 Simple Wound Measurement - one wound _0  - 0 Complex Wound Measurement - multiple wounds INTERVENTIONS - Wound Dressings X - Small Wound Dressing one or multiple wounds 1 10 _1  - 0 Medium Wound Dressing one or multiple wounds _2  - 0 Large Wound Dressing one or multiple wounds <AOZHYQMVHQIONGEX>_5<\/MWUXLKGMWNUUVOZD>_6  - 0 Application of Medications - topical <UYQIHKVQQVZDGLOV>_5<\/IEPPIRJJOACZYSAY>_3  - 0 Application of Medications - injection INTERVENTIONS - Miscellaneous _5  - External ear exam 0 _6  - 0 Specimen Collection (cultures, biopsies, blood, body fluids, etc.) _7  - 0 Specimen(s) / Culture(s) sent or taken to Lab for analysis _8  - 0 Patient Transfer (multiple staff / Civil Service fast streamer / Similar devices) _9  - 0 Simple Staple / Suture removal (25 or less) _10  - 0 Complex Staple / Suture removal (26 or more) _11  - 0 Hypo / Hyperglycemic Management (close monitor of Blood Glucose) _12  - 0 Ankle / Brachial Index (ABI) - do not check if billed separately X- 1 5 Vital Signs Has the patient been seen at the hospital within the last three years: Yes Total Score: 95 Level Of Care: New/Established - Level 3 Electronic Signature(s) Signed: 11/30/2021 3:39:46 PM By: Alycia Rossetti Entered By: Alycia Rossetti on 11/30/2021 15:38:12 Chualar, Herminio Heads ([id number]) -------------------------------------------------------------------------------- Encounter Discharge Information Details Patient Name: SHERRAL, DIROCCO Date of Service: 11/30/2021 2:30 PM Medical Record Number: [id number] Patient Account Number: [id number] Date of Birth/Sex: [birth year] (36 y.o. F) Treating RN: Alycia Rossetti Primary Care Shonette Rhames: [name] Other Clinician: Referring Kavir Savoca: [name] Treating Adiel Erney/Extender: Skipper Cliche in Treatment: 4 Encounter Discharge Information Items Discharge Condition: Stable Ambulatory  Status: Ambulatory Discharge Destination: Home Transportation: Private Auto Accompanied By: friend Schedule Follow-up Appointment: Yes Clinical Summary of Care: Electronic Signature(s) Signed: 11/30/2021 3:39:29 PM By: Alycia Rossetti Entered By: Alycia Rossetti on 11/30/2021 15:39:29 Dziuba, Herminio Heads ([id number]) -------------------------------------------------------------------------------- Lower Extremity Assessment Details Patient Name: KHADEEJA, ELDEN. Date of Service: 11/30/2021 2:30 PM Medical Record Number: [id number] Patient Account Number: [id number] Date of Birth/Sex: 06/30/1985 (36 y.o. F) Treating RN: Alycia Rossetti Primary Care Berlynn Warsame: [name] Other Clinician: Referring Omar Orrego: [name] Treating Kamylah Manzo/Extender: Skipper Cliche in Treatment: 4 Edema Assessment Assessed: [Left: Yes] [Right: No] Edema: [Left: Ye] [Right: s] Calf Left: Right: Point of Measurement: 31 cm From Medial Instep 57 cm Ankle Left: Right: Point of Measurement: 10 cm From Medial Instep 31.8 cm Vascular Assessment Pulses: Dorsalis Pedis Palpable: [Left:Yes] Electronic Signature(s) Signed: 11/30/2021 3:36:27 PM By: Alycia Rossetti Entered By: Alycia Rossetti on 11/30/2021 15:36:27 Morgenthaler, Herminio Heads ([id number]) -------------------------------------------------------------------------------- Multi Wound Chart Details Patient Name: [name], [name] Date of Service: 11/30/2021 2:30 PM Medical Record Number: [id number] Patient Account Number: [id number] Date of Birth/Sex: November 01, 1985 (36 y.o. F) Treating RN: Alycia Rossetti Primary Care Jeremyah Jelley: [name] Other Clinician: Referring Consepcion Utt: [name] Treating Yochanan Eddleman/Extender: Skipper Cliche in Treatment: 4 Vital Signs Height(in): 66 Pulse(bpm): 84 Weight(lbs): 400 Blood Pressure(mmHg): 135/91 Body Mass Index(BMI): 64.6 Temperature(F): 98.6 Respiratory Rate(breaths/min): 18 Photos: [N/A:N/A] Wound Location:  Left, Medial Lower Leg N/A N/A Wounding Event: Gradually Appeared N/A N/A Primary Etiology: Venous Leg Ulcer N/A N/A Comorbid History: Hypertension, Peripheral Venous N/A N/A Disease Date Acquired: 10/17/2021 N/A N/A Weeks of Treatment: 4 N/A N/A Wound Status: Open N/A N/A Wound Recurrence: No N/A N/A Measurements L x W x D (cm) 0.5x0.8x0.2 N/A N/A Area (cm) : 0.314 N/A N/A Volume (cm) : 0.063 N/A N/A % Reduction in Area: 42.90% N/A N/A % Reduction in Volume: -14.50% N/A N/A Classification: Full Thickness Without Exposed  N/A N/A Support Structures Exudate Amount: Medium N/A N/A Exudate Type: Serosanguineous N/A N/A Exudate Color: red, brown N/A N/A Granulation Amount: Medium (34-66%) N/A N/A Granulation Quality: Red N/A N/A Necrotic Amount: Medium (34-66%) N/A N/A Exposed Structures: Fat Layer (Subcutaneous Tissue): N/A N/A Yes Fascia: No Tendon: No Muscle: No Joint: No Bone: No Epithelialization: None N/A N/A Treatment Notes Electronic Signature(s) Signed: 11/30/2021 3:36:44 PM By: Alycia Rossetti Entered By: Alycia Rossetti on 11/30/2021 15:36:43 Cisney, Herminio Heads ([id number]) -------------------------------------------------------------------------------- [place] Details Patient Name: [name], [name] Date of Service: 11/30/2021 2:30 PM Medical Record Number: [id number] Patient Account Number: [id number] Date of Birth/Sex: 04/10/1986 (36 y.o. F) Treating RN: Alycia Rossetti Primary Care Blaize Nipper: [name] Other Clinician: Referring Erastus Bartolomei: [name] Treating Delsie Amador/Extender: Skipper Cliche in Treatment: 4 Active Inactive Wound/Skin Impairment Nursing Diagnoses: Knowledge deficit related to ulceration/compromised skin integrity Goals: Patient/caregiver will verbalize understanding of skin care regimen Date Initiated: 10/27/2021 Target Resolution Date: 11/27/2021 Goal Status: Active Ulcer/skin breakdown will have a volume reduction  of 30% by week 4 Date Initiated: 10/27/2021 Target Resolution Date: 12/28/2021 Goal Status: Active Ulcer/skin breakdown will have a volume reduction of 50% by week 8 Date Initiated: 10/27/2021 Target Resolution Date: 01/27/2022 Goal Status: Active Ulcer/skin breakdown will have a volume reduction of 80% by week 12 Date Initiated: 10/27/2021 Target Resolution Date: 02/27/2022 Goal Status: Active Ulcer/skin breakdown will heal within 14 weeks Date Initiated: 10/27/2021 Target Resolution Date: 03/29/2022 Goal Status: Active Interventions: Assess patient/caregiver ability to obtain necessary supplies Assess patient/caregiver ability to perform ulcer/skin care regimen upon admission and as needed Assess ulceration(s) every visit Notes: Electronic Signature(s) Signed: 11/30/2021 3:36:34 PM By: Alycia Rossetti Entered By: Alycia Rossetti on 11/30/2021 15:36:33 Sawyers, Herminio Heads ([id number]) -------------------------------------------------------------------------------- Pain Assessment Details Patient Name: [name], [name] Date of Service: 11/30/2021 2:30 PM Medical Record Number: [id number] Patient Account Number: [id number] Date of Birth/Sex: 1985/12/11 (36 y.o. F) Treating RN: Alycia Rossetti Primary Care Rithik Odea: [name] Other Clinician: Referring Emmalee Solivan: [name] Treating Viviane Semidey/Extender: Skipper Cliche in Treatment: 4 Active Problems Location of Pain Severity and Description of Pain Patient Has Paino Yes Site Locations Pain Management and Medication Current Pain Management: Electronic Signature(s) Signed: 11/30/2021 3:39:46 PM By: Alycia Rossetti Entered By: Alycia Rossetti on 11/30/2021 15:36:18 Caillouet, Herminio Heads ([id number]) -------------------------------------------------------------------------------- Patient/Caregiver Education Details Patient Name: [name], [name] Date of Service: 11/30/2021 2:30 PM Medical Record Number: [id number] Patient Account Number:  [id number] Date of Birth/Gender: 1986/01/13 (36 y.o. F) Treating RN: Alycia Rossetti Primary Care Physician: [name] Other Clinician: Referring Physician: [name] Treating Physician/Extender: Skipper Cliche in Treatment: 4 Education Assessment Education Provided To: Patient Education Topics Provided Wound/Skin Impairment: Handouts: Caring for Your Ulcer Methods: Explain/Verbal Responses: State content correctly Electronic Signature(s) Signed: 11/30/2021 3:39:46 PM By: Alycia Rossetti Entered By: Alycia Rossetti on 11/30/2021 15:38:40 Billig, Herminio Heads ([id number]) -------------------------------------------------------------------------------- Wound Assessment Details Patient Name: [name], [name]. Date of Service: 11/30/2021 2:30 PM Medical Record Number: [id number] Patient Account Number: [id number] Date of Birth/Sex: [birth year] (36 y.o. F) Treating RN: Alycia Rossetti Primary Care Laryah Neuser: [name] Other Clinician: Referring Edla Para: [name] Treating Estill Llerena/Extender: Skipper Cliche in Treatment: 4 Wound Status Wound Number: 4 Primary Etiology: Venous Leg Ulcer Wound Location: Left, Medial Lower Leg Wound Status: Open Wounding Event: Gradually Appeared Comorbid History: Hypertension, Peripheral Venous Disease Date Acquired: 10/17/2021 Weeks Of Treatment: 4 Clustered Wound: No Photos Wound Measurements Length: (cm) 0.5 Width: (cm) 0.8 Depth: (cm) 0.2 Area: (cm) 0.314 Volume: (cm) 0.063 %  Reduction in Area: 42.9% % Reduction in Volume: -14.5% Epithelialization: None Wound Description Classification: Full Thickness Without Exposed Support Structures Exudate Amount: Medium Exudate Type: Serosanguineous Exudate Color: red, brown Foul Odor After Cleansing: No Slough/Fibrino No Wound Bed Granulation Amount: Medium (34-66%) Exposed Structure Granulation Quality: Red Fascia Exposed: No Necrotic Amount: Medium (34-66%) Fat Layer  (Subcutaneous Tissue) Exposed: Yes Necrotic Quality: Adherent Slough Tendon Exposed: No Muscle Exposed: No Joint Exposed: No Bone Exposed: No Treatment Notes Wound #4 (Lower Leg) Wound Laterality: Left, Medial Cleanser [place]ram Ancillary Kit - 15 Day Supply Discharge Instruction: Use supplies as instructed; Kit contains: (15) Saline Bullets; (15) 3x3 Gauze; 15 pr Gloves Soap and Water Discharge Instruction: Gently cleanse wound with antibacterial soap, rinse and pat dry prior to dressing wounds Michalec, Josely J. ([id number]) Peri-Wound Care Topical Primary Dressing IODOFLEX 0.9% Cadexomer Iodine Pad Discharge Instruction: Apply Iodoflex to wound bed only as directed. Secondary Dressing (BORDER) Zetuvit Plus SILICONE BORDER Dressing 4x4 (in/in) Discharge Instruction: Please do not put silicone bordered dressings under wraps. Use non-bordered dressing only. Secured With Compression Wrap tubi grip Discharge Instruction: size E double layer Compression Stockings Add-Ons Electronic Signature(s) Signed: 11/30/2021 3:39:46 PM By: Alycia Rossetti Entered By: Alycia Rossetti on 11/30/2021 15:22:00 Karger, Herminio Heads ([id number]) -------------------------------------------------------------------------------- Vitals Details Patient Name: [name], [name] Date of Service: 11/30/2021 2:30 PM Medical Record Number: [id number] Patient Account Number: [id number] Date of Birth/Sex: 12-Sep-1985 (36 y.o. F) Treating RN: Alycia Rossetti Primary Care Tien Spooner: [name] Other Clinician: Referring Nechemia Chiappetta: [name] Treating Celena Lanius/Extender: Skipper Cliche in Treatment: 4 Vital Signs Time Taken: 14:15 Temperature (F): 98.6 Height (in): 66 Pulse (bpm): 84 Weight (lbs): 400 Respiratory Rate (breaths/min): 18 Body Mass Index (BMI): 64.6 Blood Pressure (mmHg): 135/91 Reference Range: 80 - 120 mg / dl Electronic Signature(s) Signed: 11/30/2021 3:36:13 PM By: Alycia Rossetti Entered By:  Alycia Rossetti on 11/30/2021 15:36:13

## 2021-12-07 ENCOUNTER — Ambulatory Visit: Payer: BC Managed Care – PPO | Admitting: Physician Assistant

## 2021-12-14 ENCOUNTER — Encounter: Payer: BC Managed Care – PPO | Admitting: Physician Assistant

## 2021-12-21 ENCOUNTER — Encounter: Payer: BC Managed Care – PPO | Attending: Physician Assistant | Admitting: Physician Assistant

## 2021-12-21 DIAGNOSIS — Z79899 Other long term (current) drug therapy: Secondary | ICD-10-CM | POA: Diagnosis not present

## 2021-12-21 DIAGNOSIS — I890 Lymphedema, not elsewhere classified: Secondary | ICD-10-CM | POA: Diagnosis not present

## 2021-12-21 DIAGNOSIS — I10 Essential (primary) hypertension: Secondary | ICD-10-CM | POA: Diagnosis not present

## 2021-12-21 DIAGNOSIS — Z6841 Body Mass Index (BMI) 40.0 and over, adult: Secondary | ICD-10-CM | POA: Insufficient documentation

## 2021-12-21 DIAGNOSIS — L97822 Non-pressure chronic ulcer of other part of left lower leg with fat layer exposed: Secondary | ICD-10-CM | POA: Insufficient documentation

## 2021-12-21 DIAGNOSIS — I83028 Varicose veins of left lower extremity with ulcer other part of lower leg: Secondary | ICD-10-CM | POA: Insufficient documentation

## 2021-12-21 DIAGNOSIS — I872 Venous insufficiency (chronic) (peripheral): Secondary | ICD-10-CM | POA: Diagnosis not present

## 2021-12-21 NOTE — Progress Notes (Addendum)
[name] ([id number]) Visit Report for 12/21/2021 Chief Complaint Document Details Patient Name: [name]. Date of Service: 12/21/2021 4:00 PM Medical Record Number: [id number] Patient Account Number: [id number] Date of Birth/Sex: 19-Sep-1985 (36 y.o. F) Treating RN: Cornell Barman Primary Care Provider: [name] Other Clinician: [name] Referring Provider: [name] Treating Provider/Extender: Skipper Cliche in Treatment: 7 Information Obtained from: Patient Chief Complaint Left LE Ulcer Electronic Signature(s) Signed: 12/21/2021 4:47:00 PM By: Worthy Keeler PA-C Entered By: Worthy Keeler on 12/21/2021 16:46:59 [name] ([id number]) -------------------------------------------------------------------------------- HPI Details Patient Name: [name] Date of Service: 12/21/2021 4:00 PM Medical Record Number: [id number] Patient Account Number: [id number] Date of Birth/Sex: 1986-03-31 (36 y.o. F) Treating RN: Cornell Barman Primary Care Provider: [name] Other Clinician: [name] Referring Provider: [name] Treating Provider/Extender: Skipper Cliche in Treatment: 7 History of Present Illness HPI Description: 36 year old patient who started with having ulcerations on the right lower leg on the lateral part of her ankle for about 2 weeks. She was seen in the ER at [facility] and advised to see the wound care for a consultation. No X-rays of workup was done during the ER visit and no prescription for any medications of compression wraps were given. the patient is not diabetic but does have hypertension and her medications have been reviewed by me. 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 The right and left greater saphenous vein was found to be  tortuous. Deep venous system was also not competent and there was reflux of more than 500 ms. She was then seen by Dr. Sherren Mocha Early who recommended that the patient would not benefit from endovenous ablation and he had recommended vein stripping on the right side and multiple small phlebectomy procedures on the left side. the patient did not follow-up due to social economic reasons. She has not been wearing any compression stockings and has not taken any specific treatment for varicose veins for the last 3 years. 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 We have obtained some appointments to see the vascular surgeons by the end of the month and the patient would like to follow up with me at my [facility] on Wednesday, June 29. 10/14/2014 -- she could not see me yesterday in [place] and hence has come for a review today. She has a vascular workup to be done this afternoon at [facility]. She is doing fine otherwise. 10/22/2014 -- she was seen by Dr. Curt Jews and he has recommended surgical removal of her right saphenous vein from distal thigh to saphenofemoral junction and stab phlebectomy's of multiple large tributary branches throughout her thigh and calf. This would be done under general anesthesia in the outpatient setting. 10/29/2014 -- she is trying to work on a surgical date and in the meanwhile we have got insurance clearance for Apligraf and we will start this next week. 7/22 2016 -- she is here for the first application of Apligraf. 11/19/2014 -- she is here for a second application of Apligraf 11/26/2014 -- she has done fine after her last application of Apligraf and is awaiting her surgery which is scheduled for August 31. 12/03/2014 -- she is doing fine and is here  for her third application of Apligraf. 12/21/2014 -- She had surgery on 12/15/2014 by Dr.Early who did #1  ligation and stripping of right great saphenous vein from distal thigh to saphenofemoral junction, #2 stab phlebectomy of large tributary varicose veins in the thigh popliteal space and calf. She had an Ace wrap up to her groin and this was removed today and the Unna's boot was also removed. 12/28/2014 -- she is here for her fourth application of Apligraf. 01/06/2015 - he saw her vascular surgeon Dr. Donnetta Hutching who was pleased with her progress and he has confirmed that no surgical procedures could be attempted on the left side. 01/13/2015 -- her wound looks very good and she's been having no problems whatsoever. Readmission: 07/26/2020 upon evaluation today patient presents for initial inspection here in our clinic for a new issue with her left leg although she is previously been seen due to issues with the right leg back in 2016. At that time she was seeing Dr. Donnetta Hutching who is a vein/vascular specialist in [place]. He has since semiretired from what I understand. He is working out of [employer] I believe. Nonetheless she tells me at the time that there was really nothing to do for her left leg although the right leg was where they did most of the work. Subsequently she states that she is done fairly well until just in the past week where she had issues with bleeding from what appears to be varicose vein on the left leg medially. Unfortunately this has continued to be an issue although she tells me at first it was coming much more significantly Down quite a bit but nonetheless has not completely resolved. Every time she showers she notices that it starts to drain a little bit more. She does have a history of chronic venous insufficiency, lymphedema, varicose veins bilaterally, and obesity. 08/02/2020 upon evaluation today patient appears to be doing about the same in regards to the ulcer on her left leg. She has some eschar covering there is definitely some fluid collecting underneath unfortunately. With  that being said she tells me she is still having a tremendous amount of pain therefore she is really not able to allow me to clean this off very effectively to be perfectly honest. I think we need to try to soften this up 08/16/2020 upon evaluation today patient's wound is really not doing significantly better not really states about the same. She notes that the wrap just does not seem to be staying up very well at all unfortunately. No fevers, chills, nausea, vomiting, or diarrhea. She did cut it off once it starts to slide in order to alleviate some of the pressure from sliding Down. Fortunately there is no signs of active infection at this time which is great news. 08/23/2020 upon evaluation today patient appears to be doing well 08/23/2020 upon evaluation today patient appears to be doing well with regard to her wound all things considered. Fortunately there does not appear to be any signs of active infection at this time which is great news. She has been tolerating the dressing changes without complication and overall I am extremely pleased with where things stand at this point. She does have her appointment with vascular in [facility] on June 9. ELLEE, WAWRZYNIAK ([id number]) 08/30/2020 upon evaluation today patient actually appears to be doing decently well in regard to her wound. Fortunately there is no signs of active infection which is great news. Nonetheless I do believe that the patient is going require little  bit of debridement if she is okay with me attempting that today I think that will help clean off some of the necrotic tissue. Fortunately there does not appear to be otherwise any evidence of active infection which is also great news. 09/19/2020 upon evaluation today patient appears to be doing a little better in regard to her wound as compared to previous. Fortunately there does not appear to be any signs of active infection overall. No fever chills noted. I do believe that the Iodosorb  has made this a little bit better with regard to the overall size and appearance of the wound bed though again she does still have quite a ways to go to get this to heal she still very tender to touch. 09/27/2020 upon evaluation today patient appears to actually be doing quite well with regard to her wound. This is measuring much smaller which is great news. With that being said she did see vein and vascular in [facility] and they subsequently recommended that surgery is really what she probably needs to go forward with sounds like the potential for venous ablation. With that being said the patient tells me this is just not the right time for her to be able to proceed with any type of surgery which I completely understand. Nonetheless I do believe that she would continue to benefit from compression but again that is really not something that she is able to easily do. 10/04/2020 upon evaluation today patient appears to be doing about the same in regard to her wound. This is measuring a little bit smaller but nonetheless still is open and again has some slough and biofilm noted on the surface of the wound. There does not appear to be any signs of active infection which is great news. No fevers, chills, nausea, vomiting, or diarrhea. 10/04/2020 upon evaluation today patient appears to be doing well with regard to her wound. She has been tolerating dressing changes without complication. Fortunately there does not appear to be any signs of active infection which is great news. No fevers, chills, nausea, vomiting, or diarrhea. 10/25/2020 upon evaluation today patient appears to be doing well with regard to her wound. She has been tolerating the dressing changes without complication. Fortunately there is no signs of active infection at this time. No fevers, chills, nausea, vomiting, or diarrhea. 11/01/2020 upon evaluation today patient with regard to her wound. She has been tolerating the dressing changes without  complication. Fortunately there does not appear to be any signs of infection which is great news. No fever chills noted 11/15/2020 upon evaluation today patient appears to be doing well with regard to her wound. Fortunately there is no signs of active infection at this time. No fevers, chills, nausea, vomiting, or diarrhea. With that being said she continues to have a significant amount of pain at the site even though this is very close to complete closure. She also had several varicose veins around the area which were also problematic. Overall however I feel like the patient is making excellent progress. 11/28/2020 upon evaluation today patient appears to be doing well with regard to her leg ulcer. Again were not really able to debride or compression wrap her due to discomfort and pain. She does not allow for that. With that being said we have been using Iodosorb which does seem to be doing decently well. Fortunately there is no signs of active infection at this time which is great news. No fevers, chills, nausea, vomiting, or diarrhea. 8/31; patient presents  for 2-week follow-up. She has been using Iodosorb. She reports that the wound is closed. She denies signs of infection. Readmission: 10-27-2021 upon evaluation this is a patient that presents today whom I have previously seen this is pretty much for the same issue though I think a little bit higher than the last time I saw her. She does have a history of chronic venous insufficiency and hypertension along with varicose veins. Subsequently she does have an ulceration which spontaneously ruptured she has not been wearing any compression which I think is a big part of the issue here. We discussed this before I really think she probably needs to be wearing her compression therapy, she probably needs lymphedema pumps if she can ever wear the compression for a significant amount of time to get these, and subsequently also think that she needs to be  elevating her legs is much as possible she may even need some vascular intervention in regard to her veins. All of this was reiterated and discussed with her today to reinforce what needs to happen in order to ensure that her legs do not get a lot worse. The patient voiced understanding. She tells me that she knows because she is seeing her mom go through a lot of this as well how bad things can get. 11-03-2021 upon evaluation today patient appears to be doing well with regard to her wound. Fortunately there does not appear to be any signs of active infection at this time. She is measuring a little bit bigger but I think this is because the wound is actually cleaning up a bit here. 7/27; left lateral leg wound not any smaller but perhaps with a cleaner surface. She is using Iodoflex to help with the latter and using Tubigrip. She has chronic venous insufficiency with secondary lymphedema. She is apparently followed by vein and vascular and is being scheduled for an ablation 11-17-2021 upon evaluation today patient appears to be doing well with regard to her wound this is actually showing signs of improvement which is great news. Fortunately I do not see any evidence of active infection locally or systemically at this time which is great news. No fevers, chills, nausea, vomiting, or diarrhea. 11-24-2021 upon evaluation today patient's wound is actually showing signs of significant improvement. Unfortunately she had quite a bit of pain with debridement last week I do believe it was beneficial but at the same time she is doing much better but still really does not want this debrided again I think being that it is looking a whole lot better I would try to avoid that today especially since it caused her so much discomfort that is really not the goal and I explained that to the patient today she voiced understanding and knows that it needed to be done but still states that it was quite painful. 11-30-2021 upon  evaluation today patient appears to be doing excellent in regard to her wound this is actually showing signs of excellent improvement I am very pleased with where things stand. She does have her venous ablation appointment for October 17. 12-21-2021 upon evaluation today patient appears to be doing better in regard to her wound this is measuring smaller and looking better as well. Fortunately I do not see any signs of active infection locally or systemically which is great news. Electronic Signature(s) Signed: 12/21/2021 5:07:21 PM By: Worthy Keeler PA-C Entered By: Worthy Keeler on 12/21/2021 17:07:21 [name] ([id number] Rob, [name] ([id number]) -------------------------------------------------------------------------------- Physical Exam  Details Patient Name: [name]. Date of Service: 12/21/2021 4:00 PM Medical Record Number: [id number] Patient Account Number: [id number] Date of Birth/Sex: Nov 01, 1985 (36 y.o. F) Treating RN: Cornell Barman Primary Care Provider: [name] Other Clinician: [name] Referring Provider: [name] Treating Provider/Extender: Jeri Cos Weeks in Treatment: 7 Constitutional Well-nourished and well-hydrated in no acute distress. Respiratory normal breathing without difficulty. Psychiatric this patient is able to make decisions and demonstrates good insight into disease process. Alert and Oriented x 3. pleasant and cooperative. Notes NotUpon inspection patient's wound bed showed evidence of good granulation and epithelization at this point. The any evidence of infection locally or systemically which is great news and overall I am extremely pleased with where we stand currently. Electronic Signature(s) Signed: 12/21/2021 5:07:39 PM By: Worthy Keeler PA-C Entered By: Worthy Keeler on 12/21/2021 17:07:39 [name]  ([id number]) -------------------------------------------------------------------------------- Physician Orders Details Patient Name: [name]. Date of Service: 12/21/2021 4:00 PM Medical Record Number: [id number] Patient Account Number: [id number] Date of Birth/Sex: 12-14-1985 (36 y.o. F) Treating RN: Cornell Barman Primary Care Provider: [name] Other Clinician: [name] Referring Provider: [name] Treating Provider/Extender: Skipper Cliche in Treatment: 7 Verbal / Phone Orders: No Diagnosis Coding ICD-10 Coding Code Description I83.028 Varicose veins of left lower extremity with ulcer other part of lower leg I89.0 Lymphedema, not elsewhere classified L97.822 Non-pressure chronic ulcer of other part of left lower leg with fat layer exposed E66.01 Morbid (severe) obesity due to excess calories Follow-up Appointments o Return Appointment in 1 week. Bathing/ Shower/ Hygiene o May shower; gently cleanse wound with antibacterial soap, rinse and pat dry prior to dressing wounds Anesthetic (Use 'Patient Medications' Section for Anesthetic Order Entry) o Lidocaine applied to wound bed Edema Control - Lymphedema / Segmental Compressive Device / Other o Tubigrip double layer applied - size E left leg o Elevate, Exercise Daily and Avoid Standing for Long Periods of Time. o Elevate legs to the level of the heart and pump ankles as often as possible o Elevate leg(s) parallel to the floor when sitting. Wound Treatment Wound #4 - Lower Leg Wound Laterality: Left, Medial Cleanser: Byram Ancillary Kit - 15 Day Supply (DME) (Generic) 3 x Per Week/30 Days Discharge Instructions: Use supplies as instructed; Kit contains: (15) Saline Bullets; (15) 3x3 Gauze; 15 pr Gloves Cleanser: Soap and Water 3 x Per Week/30 Days Discharge Instructions: Gently cleanse wound with antibacterial soap, rinse and pat dry prior to dressing wounds Primary Dressing: IODOFLEX 0.9%  Cadexomer Iodine Pad (DME) (Generic) 3 x Per Week/30 Days Discharge Instructions: Apply Iodoflex to wound bed only as directed. Secondary Dressing: (BORDER) Zetuvit Plus SILICONE BORDER Dressing 4x4 (in/in) (DME) (Generic) 3 x Per Week/30 Days Discharge Instructions: Please do not put silicone bordered dressings under wraps. Use non-bordered dressing only. Compression Wrap: tubi grip 3 x Per Week/30 Days Discharge Instructions: size E double layer Electronic Signature(s) Signed: 12/21/2021 5:14:26 PM By: [name] Signed: 12/21/2021 5:41:38 PM By: Worthy Keeler PA-C Entered By: [name] on 12/21/2021 16:56:53 [name] ([id number]) -------------------------------------------------------------------------------- Problem List Details Patient Name: [name]. Date of Service: 12/21/2021 4:00 PM Medical Record Number: [id number] Patient Account Number: [id number] Date of Birth/Sex: 02-20-1986 (36 y.o. F) Treating RN: Cornell Barman Primary Care Provider: [name] Other Clinician: [name] Referring Provider: [name] Treating Provider/Extender: Skipper Cliche in Treatment: 7 Active Problems ICD-10 Encounter Code Description Active Date MDM Diagnosis I83.028 Varicose veins of left lower extremity  with ulcer other part of lower leg 10/27/2021 No Yes I89.0 Lymphedema, not elsewhere classified 10/27/2021 No Yes L97.822 Non-pressure chronic ulcer of other part of left lower leg with fat layer 10/27/2021 No Yes exposed E66.01 Morbid (severe) obesity due to excess calories 10/27/2021 No Yes Inactive Problems Resolved Problems Electronic Signature(s) Signed: 12/21/2021 4:46:57 PM By: Worthy Keeler PA-C Entered By: Worthy Keeler on 12/21/2021 16:46:57 [name] ([id number]) -------------------------------------------------------------------------------- Progress Note Details Patient Name: [name] Date of Service: 12/21/2021 4:00  PM Medical Record Number: [id number] Patient Account Number: [id number] Date of Birth/Sex: February 12, 1986 (36 y.o. F) Treating RN: Cornell Barman Primary Care Provider: [name] Other Clinician: [name] Referring Provider: [name] Treating Provider/Extender: Skipper Cliche in Treatment: 7 Subjective Chief Complaint Information obtained from Patient Left LE Ulcer History of Present Illness (HPI) 36 year old patient who started with having ulcerations on the right lower leg on the lateral part of her ankle for about 2 weeks. She was seen in the ER at [facility] and advised to see the wound care for a consultation. No X-rays of workup was done during the ER visit and no prescription for any medications of compression wraps were given. the patient is not diabetic but does have hypertension and her medications have been reviewed by me. 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 The right and left greater saphenous vein was found to be tortuous. Deep venous system was also not competent and there was reflux of more than 500 ms. She was then seen by Dr. Sherren Mocha Early who recommended that the patient would not benefit from endovenous ablation and he had recommended vein stripping on the right side and multiple small phlebectomy procedures on the left side. the patient did not follow-up due to social economic reasons. She has not been wearing any compression stockings and has not taken any specific treatment for varicose veins for the last 3 years. 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 We have obtained some appointments to see the vascular surgeons by the end of the month and the patient would like to follow up  with me at my [facility] on Wednesday, June 29. 10/14/2014 -- she could not see me yesterday in [place] and hence has come for a review today. She has a vascular workup to be done this afternoon at [facility]. She is doing fine otherwise. 10/22/2014 -- she was seen by Dr. Curt Jews and he has recommended surgical removal of her right saphenous vein from distal thigh to saphenofemoral junction and stab phlebectomy's of multiple large tributary branches throughout her thigh and calf. This would be done under general anesthesia in the outpatient setting. 10/29/2014 -- she is trying to work on a surgical date and in the meanwhile we have got insurance clearance for Apligraf and we will start this next week. 7/22 2016 -- she is here for the first application of Apligraf. 11/19/2014 -- she is here for a second application of Apligraf 11/26/2014 -- she has done fine after her last application of Apligraf and is awaiting her surgery which is scheduled for August 31. 12/03/2014 -- she is doing fine and is here for her third application of Apligraf. 12/21/2014 -- She had  surgery on 12/15/2014 by Dr.Early who did #1 ligation and stripping of right great saphenous vein from distal thigh to saphenofemoral junction, #2 stab phlebectomy of large tributary varicose veins in the thigh popliteal space and calf. She had an Ace wrap up to her groin and this was removed today and the Unna's boot was also removed. 12/28/2014 -- she is here for her fourth application of Apligraf. 01/06/2015 - he saw her vascular surgeon Dr. Donnetta Hutching who was pleased with her progress and he has confirmed that no surgical procedures could be attempted on the left side. 01/13/2015 -- her wound looks very good and she's been having no problems whatsoever. Readmission: 07/26/2020 upon evaluation today patient presents for initial inspection here in our clinic for a new issue with her left leg although she is previously been seen  due to issues with the right leg back in 2016. At that time she was seeing Dr. Donnetta Hutching who is a vein/vascular specialist in [place]. He has since semiretired from what I understand. He is working out of [employer] I believe. Nonetheless she tells me at the time that there was really nothing to do for her left leg although the right leg was where they did most of the work. Subsequently she states that she is done fairly well until just in the past week where she had issues with bleeding from what appears to be varicose vein on the left leg medially. Unfortunately this has continued to be an issue although she tells me at first it was coming much more significantly Down quite a bit but nonetheless has not completely resolved. Every time she showers she notices that it starts to drain a little bit more. She does have a history of chronic venous insufficiency, lymphedema, varicose veins bilaterally, and obesity. 08/02/2020 upon evaluation today patient appears to be doing about the same in regards to the ulcer on her left leg. She has some eschar covering there is definitely some fluid collecting underneath unfortunately. With that being said she tells me she is still having a tremendous amount of pain therefore she is really not able to allow me to clean this off very effectively to be perfectly honest. I think we need to try to soften this up 08/16/2020 upon evaluation today patient's wound is really not doing significantly better not really states about the same. She notes that the wrap just does not seem to be staying up very well at all unfortunately. No fevers, chills, nausea, vomiting, or diarrhea. She did cut it off once it starts to slide in order to alleviate some of the pressure from sliding Down. Fortunately there is no signs of active infection at this time which is great news. [name] ([id number]) 08/23/2020 upon evaluation today patient appears to be doing well 08/23/2020 upon  evaluation today patient appears to be doing well with regard to her wound all things considered. Fortunately there does not appear to be any signs of active infection at this time which is great news. She has been tolerating the dressing changes without complication and overall I am extremely pleased with where things stand at this point. She does have her appointment with vascular in [facility] on June 9. 08/30/2020 upon evaluation today patient actually appears to be doing decently well in regard to her wound. Fortunately there is no signs of active infection which is great news. Nonetheless I do believe that the patient is going require little bit of debridement if she is okay with me attempting  that today I think that will help clean off some of the necrotic tissue. Fortunately there does not appear to be otherwise any evidence of active infection which is also great news. 09/19/2020 upon evaluation today patient appears to be doing a little better in regard to her wound as compared to previous. Fortunately there does not appear to be any signs of active infection overall. No fever chills noted. I do believe that the Iodosorb has made this a little bit better with regard to the overall size and appearance of the wound bed though again she does still have quite a ways to go to get this to heal she still very tender to touch. 09/27/2020 upon evaluation today patient appears to actually be doing quite well with regard to her wound. This is measuring much smaller which is great news. With that being said she did see vein and vascular in [facility] and they subsequently recommended that surgery is really what she probably needs to go forward with sounds like the potential for venous ablation. With that being said the patient tells me this is just not the right time for her to be able to proceed with any type of surgery which I completely understand. Nonetheless I do believe that she would continue to  benefit from compression but again that is really not something that she is able to easily do. 10/04/2020 upon evaluation today patient appears to be doing about the same in regard to her wound. This is measuring a little bit smaller but nonetheless still is open and again has some slough and biofilm noted on the surface of the wound. There does not appear to be any signs of active infection which is great news. No fevers, chills, nausea, vomiting, or diarrhea. 10/04/2020 upon evaluation today patient appears to be doing well with regard to her wound. She has been tolerating dressing changes without complication. Fortunately there does not appear to be any signs of active infection which is great news. No fevers, chills, nausea, vomiting, or diarrhea. 10/25/2020 upon evaluation today patient appears to be doing well with regard to her wound. She has been tolerating the dressing changes without complication. Fortunately there is no signs of active infection at this time. No fevers, chills, nausea, vomiting, or diarrhea. 11/01/2020 upon evaluation today patient with regard to her wound. She has been tolerating the dressing changes without complication. Fortunately there does not appear to be any signs of infection which is great news. No fever chills noted 11/15/2020 upon evaluation today patient appears to be doing well with regard to her wound. Fortunately there is no signs of active infection at this time. No fevers, chills, nausea, vomiting, or diarrhea. With that being said she continues to have a significant amount of pain at the site even though this is very close to complete closure. She also had several varicose veins around the area which were also problematic. Overall however I feel like the patient is making excellent progress. 11/28/2020 upon evaluation today patient appears to be doing well with regard to her leg ulcer. Again were not really able to debride or compression wrap her due to  discomfort and pain. She does not allow for that. With that being said we have been using Iodosorb which does seem to be doing decently well. Fortunately there is no signs of active infection at this time which is great news. No fevers, chills, nausea, vomiting, or diarrhea. 8/31; patient presents for 2-week follow-up. She has been using Iodosorb. She reports  that the wound is closed. She denies signs of infection. Readmission: 10-27-2021 upon evaluation this is a patient that presents today whom I have previously seen this is pretty much for the same issue though I think a little bit higher than the last time I saw her. She does have a history of chronic venous insufficiency and hypertension along with varicose veins. Subsequently she does have an ulceration which spontaneously ruptured she has not been wearing any compression which I think is a big part of the issue here. We discussed this before I really think she probably needs to be wearing her compression therapy, she probably needs lymphedema pumps if she can ever wear the compression for a significant amount of time to get these, and subsequently also think that she needs to be elevating her legs is much as possible she may even need some vascular intervention in regard to her veins. All of this was reiterated and discussed with her today to reinforce what needs to happen in order to ensure that her legs do not get a lot worse. The patient voiced understanding. She tells me that she knows because she is seeing her mom go through a lot of this as well how bad things can get. 11-03-2021 upon evaluation today patient appears to be doing well with regard to her wound. Fortunately there does not appear to be any signs of active infection at this time. She is measuring a little bit bigger but I think this is because the wound is actually cleaning up a bit here. 7/27; left lateral leg wound not any smaller but perhaps with a cleaner surface. She is  using Iodoflex to help with the latter and using Tubigrip. She has chronic venous insufficiency with secondary lymphedema. She is apparently followed by vein and vascular and is being scheduled for an ablation 11-17-2021 upon evaluation today patient appears to be doing well with regard to her wound this is actually showing signs of improvement which is great news. Fortunately I do not see any evidence of active infection locally or systemically at this time which is great news. No fevers, chills, nausea, vomiting, or diarrhea. 11-24-2021 upon evaluation today patient's wound is actually showing signs of significant improvement. Unfortunately she had quite a bit of pain with debridement last week I do believe it was beneficial but at the same time she is doing much better but still really does not want this debrided again I think being that it is looking a whole lot better I would try to avoid that today especially since it caused her so much discomfort that is really not the goal and I explained that to the patient today she voiced understanding and knows that it needed to be done but still states that it was quite painful. 11-30-2021 upon evaluation today patient appears to be doing excellent in regard to her wound this is actually showing signs of excellent improvement I am very pleased with where things stand. She does have her venous ablation appointment for October 17. 12-21-2021 upon evaluation today patient appears to be doing better in regard to her wound this is measuring smaller and looking better as well. Fortunately I do not see any signs of active infection locally or systemically which is great news. [name] ([id number]) Objective Constitutional Well-nourished and well-hydrated in no acute distress. Vitals Time Taken: 4:30 PM, Height: 66 in, Weight: 400 lbs, BMI: 64.6, Temperature: 98.6 F, Pulse: 86 bpm, Respiratory Rate: 16 breaths/min, Blood Pressure: 120/82  mmHg.  Respiratory normal breathing without difficulty. Psychiatric this patient is able to make decisions and demonstrates good insight into disease process. Alert and Oriented x 3. pleasant and cooperative. General Notes: NotUpon inspection patient's wound bed showed evidence of good granulation and epithelization at this point. The any evidence of infection locally or systemically which is great news and overall I am extremely pleased with where we stand currently. Integumentary (Hair, Skin) Wound #4 status is Open. Original cause of wound was Gradually Appeared. The date acquired was: 10/17/2021. The wound has been in treatment 7 weeks. The wound is located on the Left,Medial Lower Leg. The wound measures 0.4cm length x 0.5cm width x 0.1cm depth; 0.157cm^2 area and 0.016cm^3 volume. There is Fat Layer (Subcutaneous Tissue) exposed. There is a medium amount of serosanguineous drainage noted. There is medium (34-66%) red granulation within the wound bed. There is a medium (34-66%) amount of necrotic tissue within the wound bed including Adherent Slough. 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 Bathing/ Shower/ Hygiene: May shower; gently cleanse wound with antibacterial soap, rinse and pat dry prior to dressing wounds Anesthetic (Use 'Patient Medications' Section for Anesthetic Order Entry): Lidocaine applied to wound bed Edema Control - Lymphedema / Segmental Compressive Device / Other: Tubigrip double layer applied - size E left leg Elevate, Exercise Daily and Avoid Standing for Long Periods of Time. Elevate legs to the level of the heart and pump ankles as often as possible Elevate leg(s) parallel  to the floor when sitting. WOUND #4: - Lower Leg Wound Laterality: Left, Medial Cleanser: Byram Ancillary Kit - 15 Day Supply (DME) (Generic) 3 x Per Week/30 Days Discharge Instructions: Use supplies as instructed; Kit contains: (15) Saline Bullets; (15) 3x3 Gauze; 15 pr Gloves Cleanser: Soap and Water 3 x Per Week/30 Days Discharge Instructions: Gently cleanse wound with antibacterial soap, rinse and pat dry prior to dressing wounds Primary Dressing: IODOFLEX 0.9% Cadexomer Iodine Pad (DME) (Generic) 3 x Per Week/30 Days Discharge Instructions: Apply Iodoflex to wound bed only as directed. Secondary Dressing: (BORDER) Zetuvit Plus SILICONE BORDER Dressing 4x4 (in/in) (DME) (Generic) 3 x Per Week/30 Days Discharge Instructions: Please do not put silicone bordered dressings under wraps. Use non-bordered dressing only. Compression Wrap: tubi grip 3 x Per Week/30 Days Discharge Instructions: size E double layer BARBARAJEAN, KINZLER. ([id number]) 1. I would recommend that we go ahead and continue with the wound care measures as before and the patient is in agreement with the plan. This includes the use of the Iodoflex with the bordered foam dressing which still seems to be doing well she is also doing Tubigrip which is a size E double layer. 2. I am also can recommend that we have the patient continue to monitor for any signs of infection if anything changes she should contact the office and let me know. We will see patient back for reevaluation in 1 week here in the clinic. If anything worsens or changes patient will contact our office for additional recommendations. Electronic Signature(s) Signed: 12/21/2021 5:08:09 PM By: Worthy Keeler PA-C Entered By: Worthy Keeler on 12/21/2021 17:08:09 Ungerer, [name] ([id number]) -------------------------------------------------------------------------------- SuperBill Details Patient Name: BERANIA, PEEDIN Date of Service: 12/21/2021 Medical Record  Number: [id number] Patient Account Number: [id number]  Date of Birth/Sex: Sep 24, 1985 (36 y.o. F) Treating RN: Cornell Barman Primary Care Provider: [name] Other Clinician: [name] Referring Provider: [name] Treating Provider/Extender: Skipper Cliche in Treatment: 7 Diagnosis Coding ICD-10 Codes Code Description [phone] Varicose veins of left lower extremity with ulcer other part of lower leg I89.0 Lymphedema, not elsewhere classified L97.822 Non-pressure chronic ulcer of other part of left lower leg with fat layer exposed E66.01 Morbid (severe) obesity due to excess calories Facility Procedures CPT4 Code: 71696789 Description: 38101 - WOUND CARE VISIT-LEV 3 EST PT Modifier: Quantity: 1 Physician Procedures CPT4 Code: [id number] Description: 52778 - WC PHYS LEVEL 3 - EST PT Modifier: Quantity: 1 CPT4 Code: Description: ICD-10 Diagnosis Description I83.028 Varicose veins of left lower extremity with ulcer other part of lower l I89.0 Lymphedema, not elsewhere classified L97.822 Non-pressure chronic ulcer of other part of left lower leg with fat lay E66.01  Morbid (severe) obesity due to excess calories Modifier: eg er exposed Quantity: Electronic Signature(s) Signed: 12/21/2021 5:08:20 PM By: Worthy Keeler PA-C Entered By: Worthy Keeler on 12/21/2021 17:08:19

## 2021-12-22 NOTE — Progress Notes (Signed)
[name], [name] ([id number]) Visit Report for 12/21/2021 Arrival Information Details Patient Name: [name], [name]. Date of Service: 12/21/2021 4:00 PM Medical Record Number: [id number] Patient Account Number: [id number] Date of Birth/Sex: [birth year] (36 y.o. F) Treating RN: Cornell Barman Primary Care Siren Porrata: [name] Other Clinician: [name] Referring Ailynn Gow: [name] Treating Laura-Lee Villegas/Extender: Skipper Cliche in Treatment: 7 Visit Information History Since Last Visit All ordered tests and consults were completed: No Patient Arrived: Ambulatory Added or deleted any medications: No Arrival Time: 16:26 Any new allergies or adverse reactions: No Transfer Assistance: None Had a fall or experienced change in No Patient Requires Transmission-Based Precautions: No activities of daily living that may affect Patient Has Alerts: No risk of falls: Hospitalized since last visit: No Pain Present Now: Yes Electronic Signature(s) Signed: 12/21/2021 5:14:26 PM By: [name] Entered By: [name] on 12/21/2021 16:29:36 [place], Herminio Heads ([id number]) -------------------------------------------------------------------------------- Clinic Level of Care Assessment Details Patient Name: [name], [name] Date of Service: 12/21/2021 4:00 PM Medical Record Number: [id number] Patient Account Number: [id number] Date of Birth/Sex: [birth year] (36 y.o. F) Treating RN: Cornell Barman Primary Care Kainen Struckman: [name] Other Clinician: [name] Referring Arliss Hepburn: [name] Treating Shamikia Linskey/Extender: Skipper Cliche in Treatment: 7 Clinic Level of Care Assessment Items TOOL 4 Quantity Score _0  - Use when only an EandM is performed on FOLLOW-UP visit 0 ASSESSMENTS - Nursing Assessment / Reassessment X - Reassessment of Co-morbidities (includes updates in patient status) 1 10 X- 1 5 Reassessment of Adherence to Treatment Plan ASSESSMENTS - Wound and Skin  Assessment / Reassessment X - Simple Wound Assessment / Reassessment - one wound 1 5 _1  - 0 Complex Wound Assessment / Reassessment - multiple wounds _2  - 0 Dermatologic / Skin Assessment (not related to wound area) ASSESSMENTS - Focused Assessment _3  - Circumferential Edema Measurements - multi extremities 0 _4  - 0 Nutritional Assessment / Counseling / Intervention _5  - 0 Lower Extremity Assessment (monofilament, tuning fork, pulses) _6  - 0 Peripheral Arterial Disease Assessment (using hand held doppler) ASSESSMENTS - Ostomy and/or Continence Assessment and Care _7  - Incontinence Assessment and Management 0 _8  - 0 Ostomy Care Assessment and Management (repouching, etc.) PROCESS - Coordination of Care X - Simple Patient / Family Education for ongoing care 1 15 _9  - 0 Complex (extensive) Patient / Family Education for ongoing care _10  - 0 Staff obtains Programmer, systems, Records, Test Results / Process Orders _11  - 0 Staff telephones HHA, Nursing Homes / Clarify orders / etc _12  - 0 Routine Transfer to another Facility (non-emergent condition) _13  - 0 Routine Hospital Admission (non-emergent condition) _14  - 0 New Admissions / Biomedical engineer / Ordering NPWT, Apligraf, etc. _15  - 0 Emergency Hospital Admission (emergent condition) X- 1 10 Simple Discharge Coordination _16  - 0 Complex (extensive) Discharge Coordination PROCESS - Special Needs _17  - Pediatric / Minor Patient Management 0 _18  - 0 Isolation Patient Management _19  - 0 Hearing / Language / Visual special needs _20  - 0 Assessment of Community assistance (transportation, D/C planning, etc.) _21  - 0 Additional assistance / Altered mentation _22  - 0 Support Surface(s) Assessment (bed, cushion, seat, etc.) INTERVENTIONS - Wound Cleansing / Measurement Boord, Babette J. ([id number]) X- 1 5 Simple Wound Cleansing - one wound _23  - 0 Complex Wound Cleansing - multiple wounds X- 1 5 Wound Imaging (photographs - any number  of wounds) _24  - 0 Wound Tracing (instead of photographs) X- 1 5 Simple Wound Measurement - one wound _25  - 0 Complex Wound Measurement -  multiple wounds INTERVENTIONS - Wound Dressings _0  - Small Wound Dressing one or multiple wounds 0 X- 1 15 Medium Wound Dressing one or multiple wounds _1  - 0 Large Wound Dressing one or multiple wounds X- 1 5 Application of Medications - topical <HKVQQVZDGLOVFIEP>_3<\/[id number]>_0  - 0 Application of Medications - injection INTERVENTIONS - Miscellaneous _3  - External ear exam 0 _4  - 0 Specimen Collection (cultures, biopsies, blood, body fluids, etc.) _5  - 0 Specimen(s) / Culture(s) sent or taken to Lab for analysis _6  - 0 Patient Transfer (multiple staff / Harrel Lemon Lift / Similar devices) _7  - 0 Simple Staple / Suture removal (25 or less) _8  - 0 Complex Staple / Suture removal (26 or more) _9  - 0 Hypo / Hyperglycemic Management (close monitor of Blood Glucose) _10  - 0 Ankle / Brachial Index (ABI) - do not check if billed separately X- 1 5 Vital Signs Has the patient been seen at the hospital within the last three years: Yes Total Score: 85 Level Of Care: New/Established - Level 3 Electronic Signature(s) Signed: 12/21/2021 5:14:26 PM By: [name] Entered By: [name] on 12/21/2021 16:49:40 [place], Herminio Heads ([id number]) -------------------------------------------------------------------------------- Encounter Discharge Information Details Patient Name: CIERRAH, DACE Date of Service: 12/21/2021 4:00 PM Medical Record Number: [id number] Patient Account Number: [id number] Date of Birth/Sex: Apr 08, 1986 (36 y.o. F) Treating RN: Cornell Barman Primary Care Evlyn Amason: [name] Other Clinician: [name] Referring Mihcael Ledee: [name] Treating Kynzlee Hucker/Extender: Skipper Cliche in Treatment: 7 Encounter Discharge Information Items Discharge Condition: Stable Ambulatory Status: Ambulatory Discharge Destination: Home Transportation: Private  Auto Accompanied By: self Schedule Follow-up Appointment: Yes Clinical Summary of Care: Electronic Signature(s) Signed: 12/21/2021 5:14:26 PM By: [name] Entered By: [name] on 12/21/2021 16:57:29 Kozar, Herminio Heads ([id number]) -------------------------------------------------------------------------------- Lower Extremity Assessment Details Patient Name: CAMIELLE, SIZER Date of Service: 12/21/2021 4:00 PM Medical Record Number: [id number] Patient Account Number: [id number] Date of Birth/Sex: [birth year] (36 y.o. F) Treating RN: Cornell Barman Primary Care Brooklinn Longbottom: [name] Other Clinician: [name] Referring Norelle Runnion: [name] Treating Akul Leggette/Extender: Jeri Cos Weeks in Treatment: 7 Edema Assessment Assessed: Shirlyn Goltz: Yes] [Right: No] Edema: [Left: Ye] [Right: s] Calf Left: Right: Point of Measurement: 31 cm From Medial Instep 61.4 cm Ankle Left: Right: Point of Measurement: 10 cm From Medial Instep 31.6 cm Vascular Assessment Pulses: Dorsalis Pedis Palpable: [Left:Yes] Posterior Tibial Palpable: [Left:Yes] Electronic Signature(s) Signed: 12/21/2021 4:58:49 PM By: Gretta Cool, [name], RN, CWS, [name], [name] Signed: 12/21/2021 5:14:26 PM By: [name] Entered By: [name] on 12/21/2021 16:43:40 [place], Herminio Heads ([id number]) -------------------------------------------------------------------------------- Multi Wound Chart Details Patient Name: LYBERTI, THRUSH. Date of Service: 12/21/2021 4:00 PM Medical Record Number: [id number] Patient Account Number: [id number] Date of Birth/Sex: [birth year] (36 y.o. F) Treating RN: Cornell Barman Primary Care Kelina Beauchamp: [name] Other Clinician: [name] Referring Delanda Bulluck: [name] Treating Baruch Lewers/Extender: Skipper Cliche in Treatment: 7 Vital Signs Height(in): 66 Pulse(bpm): 86 Weight(lbs): 400 Blood Pressure(mmHg): 120/82 Body Mass Index(BMI): 64.6 Temperature(F):  98.6 Respiratory Rate(breaths/min): 16 Photos: [N/A:N/A] Wound Location: Left, Medial Lower Leg N/A N/A Wounding Event: Gradually Appeared N/A N/A Primary Etiology: Venous Leg Ulcer N/A N/A Comorbid History: Hypertension, Peripheral Venous N/A N/A Disease Date Acquired: 10/17/2021 N/A N/A Weeks of Treatment: 7 N/A N/A Wound Status: Open N/A N/A Wound Recurrence: No N/A N/A Measurements L x W x D (cm) 0.4x0.5x0.1 N/A N/A Area (cm) : 0.157 N/A N/A Volume (cm) : 0.016 N/A N/A % Reduction in Area: 71.50% N/A N/A % Reduction in Volume: 70.90%  N/A N/A Classification: Full Thickness Without Exposed N/A N/A Support Structures Exudate Amount: Medium N/A N/A Exudate Type: Serosanguineous N/A N/A Exudate Color: red, brown N/A N/A Granulation Amount: Medium (34-66%) N/A N/A Granulation Quality: Red N/A N/A Necrotic Amount: Medium (34-66%) N/A N/A Exposed Structures: Fat Layer (Subcutaneous Tissue): N/A N/A Yes Fascia: No Tendon: No Muscle: No Joint: No Bone: No Epithelialization: None N/A N/A Treatment Notes Electronic Signature(s) Signed: 12/21/2021 5:14:26 PM By: [name] Entered By: [name] on 12/21/2021 16:43:55 [place], Herminio Heads ([id number]) -------------------------------------------------------------------------------- [place] Details Patient Name: GEORGANA, ROMAIN Date of Service: 12/21/2021 4:00 PM Medical Record Number: [id number] Patient Account Number: [id number] Date of Birth/Sex: December 10, 1985 (36 y.o. F) Treating RN: Cornell Barman Primary Care Blayne Garlick: [name] Other Clinician: [name] Referring Jamea Robicheaux: [name] Treating Natash Berman/Extender: Skipper Cliche in Treatment: 7 Active Inactive Wound/Skin Impairment Nursing Diagnoses: Knowledge deficit related to ulceration/compromised skin integrity Goals: Patient/caregiver will verbalize understanding of skin care regimen Date Initiated: 10/27/2021 Target  Resolution Date: 11/27/2021 Goal Status: Active Ulcer/skin breakdown will have a volume reduction of 30% by week 4 Date Initiated: 10/27/2021 Target Resolution Date: 12/28/2021 Goal Status: Active Ulcer/skin breakdown will have a volume reduction of 50% by week 8 Date Initiated: 10/27/2021 Target Resolution Date: 01/27/2022 Goal Status: Active Ulcer/skin breakdown will have a volume reduction of 80% by week 12 Date Initiated: 10/27/2021 Target Resolution Date: 02/27/2022 Goal Status: Active Ulcer/skin breakdown will heal within 14 weeks Date Initiated: 10/27/2021 Target Resolution Date: 03/29/2022 Goal Status: Active Interventions: Assess patient/caregiver ability to obtain necessary supplies Assess patient/caregiver ability to perform ulcer/skin care regimen upon admission and as needed Assess ulceration(s) every visit Notes: Electronic Signature(s) Signed: 12/21/2021 4:58:49 PM By: Gretta Cool, [name], RN, CWS, [name], [name] Signed: 12/21/2021 5:14:26 PM By: [name] Entered By: [name] on 12/21/2021 16:43:47 [place], Herminio Heads ([id number]) -------------------------------------------------------------------------------- Pain Assessment Details Patient Name: MIKELLA, LINSLEY. Date of Service: 12/21/2021 4:00 PM Medical Record Number: [id number] Patient Account Number: [id number] Date of Birth/Sex: 04-Jun-1985 (36 y.o. F) Treating RN: Cornell Barman Primary Care Rowland Ericsson: [name] Other Clinician: [name] Referring Shawn Carattini: [name] Treating Dyane Broberg/Extender: Skipper Cliche in Treatment: 7 Active Problems Location of Pain Severity and Description of Pain Patient Has Paino Yes Site Locations Pain Location: Pain in Ulcers Duration of the Pain. Constant / Intermittento Intermittent Rate the pain. Current Pain Level: 5 Character of Pain Describe the Pain: Throbbing Pain Management and Medication Current Pain Management: Medication: Yes Rest: Yes Electronic  Signature(s) Signed: 12/21/2021 4:58:49 PM By: Gretta Cool, [name], RN, CWS, [name], [name] Signed: 12/21/2021 5:14:26 PM By: [name] Entered By: [name] on 12/21/2021 16:32:52 [place], Herminio Heads ([id number]) -------------------------------------------------------------------------------- Patient/Caregiver Education Details Patient Name: SENTORIA, BRENT. Date of Service: 12/21/2021 4:00 PM Medical Record Number: [id number] Patient Account Number: [id number] Date of Birth/Gender: 1985-10-16 (36 y.o. F) Treating RN: Cornell Barman Primary Care Physician: [name] Other Clinician: [name] Referring Physician: [name] Treating Physician/Extender: Skipper Cliche in Treatment: 7 Education Assessment Education Provided To: Patient Education Topics Provided Wound/Skin Impairment: Handouts: Other: continue wound care as directed Methods: Explain/Verbal Responses: State content correctly Electronic Signature(s) Signed: 12/21/2021 5:14:26 PM By: [name] Entered By: [name] on 12/21/2021 16:50:05 [place], Herminio Heads ([id number]) -------------------------------------------------------------------------------- Wound Assessment Details Patient Name: ARMANII, URBANIK Date of Service: 12/21/2021 4:00 PM Medical Record Number: [id number] Patient Account Number: [id number] Date of Birth/Sex: 1985/07/31 (36 y.o. F) Treating RN: Cornell Barman Primary Care Waymon Laser: [name] Other  Clinician: [name] Referring Jahad Old: [name] Treating Verdelle Valtierra/Extender: Skipper Cliche in Treatment: 7 Wound Status Wound Number: 4 Primary Etiology: Venous Leg Ulcer Wound Location: Left, Medial Lower Leg Wound Status: Open Wounding Event: Gradually Appeared Comorbid History: Hypertension, Peripheral Venous Disease Date Acquired: 10/17/2021 Weeks Of Treatment: 7 Clustered Wound: No Photos Wound Measurements Length: (cm) 0.4 Width: (cm) 0.5 Depth: (cm)  0.1 Area: (cm) 0.157 Volume: (cm) 0.016 % Reduction in Area: 71.5% % Reduction in Volume: 70.9% Epithelialization: None Wound Description Classification: Full Thickness Without Exposed Support Structures Exudate Amount: Medium Exudate Type: Serosanguineous Exudate Color: red, brown Foul Odor After Cleansing: No Slough/Fibrino No Wound Bed Granulation Amount: Medium (34-66%) Exposed Structure Granulation Quality: Red Fascia Exposed: No Necrotic Amount: Medium (34-66%) Fat Layer (Subcutaneous Tissue) Exposed: Yes Necrotic Quality: Adherent Slough Tendon Exposed: No Muscle Exposed: No Joint Exposed: No Bone Exposed: No Treatment Notes Wound #4 (Lower Leg) Wound Laterality: Left, Medial Cleanser Byram Ancillary Kit - 15 Day Supply Discharge Instruction: Use supplies as instructed; Kit contains: (15) Saline Bullets; (15) 3x3 Gauze; 15 pr Gloves Soap and Water Discharge Instruction: Gently cleanse wound with antibacterial soap, rinse and pat dry prior to dressing wounds Shaikh, Mima J. ([id number]) Peri-Wound Care Topical Primary Dressing IODOFLEX 0.9% Cadexomer Iodine Pad Discharge Instruction: Apply Iodoflex to wound bed only as directed. Secondary Dressing (BORDER) Zetuvit Plus SILICONE BORDER Dressing 4x4 (in/in) Discharge Instruction: Please do not put silicone bordered dressings under wraps. Use non-bordered dressing only. Secured With Compression Wrap tubi grip Discharge Instruction: size E double layer Compression Stockings Add-Ons Electronic Signature(s) Signed: 12/21/2021 4:58:49 PM By: Gretta Cool, [name], RN, CWS, [name], [name] Signed: 12/21/2021 5:14:26 PM By: [name] Entered By: [name] on 12/21/2021 16:41:38 [place], Herminio Heads ([id number]) -------------------------------------------------------------------------------- Vitals Details Patient Name: LUCILLIE, KIESEL. Date of Service: 12/21/2021 4:00 PM Medical Record Number: [id number] Patient Account  Number: [id number] Date of Birth/Sex: [birth year] (36 y.o. F) Treating RN: Cornell Barman Primary Care Krystol Rocco: [name] Other Clinician: [name] Referring [place] Liz: [name] Treating Mikesha Migliaccio/Extender: Skipper Cliche in Treatment: 7 Vital Signs Time Taken: 16:30 Temperature (F): 98.6 Height (in): 66 Pulse (bpm): 86 Weight (lbs): 400 Respiratory Rate (breaths/min): 16 Body Mass Index (BMI): 64.6 Blood Pressure (mmHg): 120/82 Reference Range: 80 - 120 mg / dl Electronic Signature(s) Signed: 12/21/2021 5:14:26 PM By: [name] Entered By: [name] on 12/21/2021 16:32:48

## 2022-01-01 ENCOUNTER — Encounter: Payer: BC Managed Care – PPO | Admitting: Physician Assistant

## 2022-01-01 DIAGNOSIS — I83028 Varicose veins of left lower extremity with ulcer other part of lower leg: Secondary | ICD-10-CM | POA: Diagnosis not present

## 2022-01-01 NOTE — Progress Notes (Signed)
[name], [name] ([id number]) Visit Report for 01/01/2022 Chief Complaint Document Details Patient Name: [name], [name]. Date of Service: 01/01/2022 3:30 PM Medical Record Number: [id number] Patient Account Number: [id number] Date of Birth/Sex: 11/01/1985 (36 y.o. F) Treating RN: Carlene Coria Primary Care Provider: [name] Other Clinician: [name] Referring Provider: [name] Treating Provider/Extender: Skipper Cliche in Treatment: 9 Information Obtained from: Patient Chief Complaint Left LE Ulcer Electronic Signature(s) Signed: 01/01/2022 4:05:25 PM By: Worthy Keeler PA-C Entered By: Worthy Keeler on 01/01/2022 16:05:25 Stys, Herminio Heads ([id number]) -------------------------------------------------------------------------------- HPI Details Patient Name: [name], [name] Date of Service: 01/01/2022 3:30 PM Medical Record Number: [id number] Patient Account Number: [id number] Date of Birth/Sex: 11-26-1985 (36 y.o. F) Treating RN: Carlene Coria Primary Care Provider: [name] Other Clinician: [name] Referring Provider: [name] Treating Provider/Extender: Skipper Cliche in Treatment: 9 History of Present Illness HPI Description: 36 year old patient who started with having ulcerations on the right lower leg on the lateral part of her ankle for about 2 weeks. She was seen in the ER at [facility] and advised to see the wound care for a consultation. No X-rays of workup was done during the ER visit and no prescription for any medications of compression wraps were given. the patient is not diabetic but does have hypertension and her medications have been reviewed by me. 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 The right and left greater saphenous vein was found to be  tortuous. Deep venous system was also not competent and there was reflux of more than 500 ms. She was then seen by Dr. Sherren Mocha Early who recommended that the patient would not benefit from endovenous ablation and he had recommended vein stripping on the right side and multiple small phlebectomy procedures on the left side. the patient did not follow-up due to social economic reasons. She has not been wearing any compression stockings and has not taken any specific treatment for varicose veins for the last 3 years. 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 We have obtained some appointments to see the vascular surgeons by the end of the month and the patient would like to follow up with me at my [facility] on Wednesday, June 29. 10/14/2014 -- she could not see me yesterday in [place] and hence has come for a review today. She has a vascular workup to be done this afternoon at [facility]. She is doing fine otherwise. 10/22/2014 -- she was seen by Dr. Curt Jews and he has recommended surgical removal of her right saphenous vein from distal thigh to saphenofemoral junction and stab phlebectomy's of multiple large tributary branches throughout her thigh and calf. This would be done under general anesthesia in the outpatient setting. 10/29/2014 -- she is trying to work on a surgical date and in the meanwhile we have got insurance clearance for Apligraf and we will start this next week. 7/22 2016 -- she is here for the first application of Apligraf. 11/19/2014 -- she is here for a second application of Apligraf 11/26/2014 -- she has done fine after her last application of Apligraf and is awaiting her surgery which is scheduled for August 31. 12/03/2014 -- she is doing fine and is here  for her third application of Apligraf. 12/21/2014 -- She had surgery on 12/15/2014 by Dr.Early who did #1  ligation and stripping of right great saphenous vein from distal thigh to saphenofemoral junction, #2 stab phlebectomy of large tributary varicose veins in the thigh popliteal space and calf. She had an Ace wrap up to her groin and this was removed today and the Unna's boot was also removed. 12/28/2014 -- she is here for her fourth application of Apligraf. 01/06/2015 - he saw her vascular surgeon Dr. Donnetta Hutching who was pleased with her progress and he has confirmed that no surgical procedures could be attempted on the left side. 01/13/2015 -- her wound looks very good and she's been having no problems whatsoever. Readmission: 07/26/2020 upon evaluation today patient presents for initial inspection here in our clinic for a new issue with her left leg although she is previously been seen due to issues with the right leg back in 2016. At that time she was seeing Dr. Donnetta Hutching who is a vein/vascular specialist in [place]. He has since semiretired from what I understand. He is working out of [employer] I believe. Nonetheless she tells me at the time that there was really nothing to do for her left leg although the right leg was where they did most of the work. Subsequently she states that she is done fairly well until just in the past week where she had issues with bleeding from what appears to be varicose vein on the left leg medially. Unfortunately this has continued to be an issue although she tells me at first it was coming much more significantly Down quite a bit but nonetheless has not completely resolved. Every time she showers she notices that it starts to drain a little bit more. She does have a history of chronic venous insufficiency, lymphedema, varicose veins bilaterally, and obesity. 08/02/2020 upon evaluation today patient appears to be doing about the same in regards to the ulcer on her left leg. She has some eschar covering there is definitely some fluid collecting underneath unfortunately. With  that being said she tells me she is still having a tremendous amount of pain therefore she is really not able to allow me to clean this off very effectively to be perfectly honest. I think we need to try to soften this up 08/16/2020 upon evaluation today patient's wound is really not doing significantly better not really states about the same. She notes that the wrap just does not seem to be staying up very well at all unfortunately. No fevers, chills, nausea, vomiting, or diarrhea. She did cut it off once it starts to slide in order to alleviate some of the pressure from sliding Down. Fortunately there is no signs of active infection at this time which is great news. 08/23/2020 upon evaluation today patient appears to be doing well 08/23/2020 upon evaluation today patient appears to be doing well with regard to her wound all things considered. Fortunately there does not appear to be any signs of active infection at this time which is great news. She has been tolerating the dressing changes without complication and overall I am extremely pleased with where things stand at this point. She does have her appointment with vascular in [facility] on June 9. ELLEE, WAWRZYNIAK ([id number]) 08/30/2020 upon evaluation today patient actually appears to be doing decently well in regard to her wound. Fortunately there is no signs of active infection which is great news. Nonetheless I do believe that the patient is going require little  bit of debridement if she is okay with me attempting that today I think that will help clean off some of the necrotic tissue. Fortunately there does not appear to be otherwise any evidence of active infection which is also great news. 09/19/2020 upon evaluation today patient appears to be doing a little better in regard to her wound as compared to previous. Fortunately there does not appear to be any signs of active infection overall. No fever chills noted. I do believe that the Iodosorb  has made this a little bit better with regard to the overall size and appearance of the wound bed though again she does still have quite a ways to go to get this to heal she still very tender to touch. 09/27/2020 upon evaluation today patient appears to actually be doing quite well with regard to her wound. This is measuring much smaller which is great news. With that being said she did see vein and vascular in [facility] and they subsequently recommended that surgery is really what she probably needs to go forward with sounds like the potential for venous ablation. With that being said the patient tells me this is just not the right time for her to be able to proceed with any type of surgery which I completely understand. Nonetheless I do believe that she would continue to benefit from compression but again that is really not something that she is able to easily do. 10/04/2020 upon evaluation today patient appears to be doing about the same in regard to her wound. This is measuring a little bit smaller but nonetheless still is open and again has some slough and biofilm noted on the surface of the wound. There does not appear to be any signs of active infection which is great news. No fevers, chills, nausea, vomiting, or diarrhea. 10/04/2020 upon evaluation today patient appears to be doing well with regard to her wound. She has been tolerating dressing changes without complication. Fortunately there does not appear to be any signs of active infection which is great news. No fevers, chills, nausea, vomiting, or diarrhea. 10/25/2020 upon evaluation today patient appears to be doing well with regard to her wound. She has been tolerating the dressing changes without complication. Fortunately there is no signs of active infection at this time. No fevers, chills, nausea, vomiting, or diarrhea. 11/01/2020 upon evaluation today patient with regard to her wound. She has been tolerating the dressing changes without  complication. Fortunately there does not appear to be any signs of infection which is great news. No fever chills noted 11/15/2020 upon evaluation today patient appears to be doing well with regard to her wound. Fortunately there is no signs of active infection at this time. No fevers, chills, nausea, vomiting, or diarrhea. With that being said she continues to have a significant amount of pain at the site even though this is very close to complete closure. She also had several varicose veins around the area which were also problematic. Overall however I feel like the patient is making excellent progress. 11/28/2020 upon evaluation today patient appears to be doing well with regard to her leg ulcer. Again were not really able to debride or compression wrap her due to discomfort and pain. She does not allow for that. With that being said we have been using Iodosorb which does seem to be doing decently well. Fortunately there is no signs of active infection at this time which is great news. No fevers, chills, nausea, vomiting, or diarrhea. 8/31; patient presents  for 2-week follow-up. She has been using Iodosorb. She reports that the wound is closed. She denies signs of infection. Readmission: 10-27-2021 upon evaluation this is a patient that presents today whom I have previously seen this is pretty much for the same issue though I think a little bit higher than the last time I saw her. She does have a history of chronic venous insufficiency and hypertension along with varicose veins. Subsequently she does have an ulceration which spontaneously ruptured she has not been wearing any compression which I think is a big part of the issue here. We discussed this before I really think she probably needs to be wearing her compression therapy, she probably needs lymphedema pumps if she can ever wear the compression for a significant amount of time to get these, and subsequently also think that she needs to be  elevating her legs is much as possible she may even need some vascular intervention in regard to her veins. All of this was reiterated and discussed with her today to reinforce what needs to happen in order to ensure that her legs do not get a lot worse. The patient voiced understanding. She tells me that she knows because she is seeing her mom go through a lot of this as well how bad things can get. 11-03-2021 upon evaluation today patient appears to be doing well with regard to her wound. Fortunately there does not appear to be any signs of active infection at this time. She is measuring a little bit bigger but I think this is because the wound is actually cleaning up a bit here. 7/27; left lateral leg wound not any smaller but perhaps with a cleaner surface. She is using Iodoflex to help with the latter and using Tubigrip. She has chronic venous insufficiency with secondary lymphedema. She is apparently followed by vein and vascular and is being scheduled for an ablation 11-17-2021 upon evaluation today patient appears to be doing well with regard to her wound this is actually showing signs of improvement which is great news. Fortunately I do not see any evidence of active infection locally or systemically at this time which is great news. No fevers, chills, nausea, vomiting, or diarrhea. 11-24-2021 upon evaluation today patient's wound is actually showing signs of significant improvement. Unfortunately she had quite a bit of pain with debridement last week I do believe it was beneficial but at the same time she is doing much better but still really does not want this debrided again I think being that it is looking a whole lot better I would try to avoid that today especially since it caused her so much discomfort that is really not the goal and I explained that to the patient today she voiced understanding and knows that it needed to be done but still states that it was quite painful. 11-30-2021 upon  evaluation today patient appears to be doing excellent in regard to her wound this is actually showing signs of excellent improvement I am very pleased with where things stand. She does have her venous ablation appointment for October 17. 12-21-2021 upon evaluation today patient appears to be doing better in regard to her wound this is measuring smaller and looking better as well. Fortunately I do not see any signs of active infection locally or systemically which is great news. 01-01-2022 upon evaluation today patient appears to be doing well currently in regard to her wound. She is showing signs of improvement which is great news and overall I do  not see any signs of active infection locally or systemically at this time. Electronic Signature(s) ELIABETH, SHOFF ([id number]) Signed: 01/01/2022 4:12:59 PM By: Worthy Keeler PA-C Entered By: Worthy Keeler on 01/01/2022 16:12:59 Galanti, Herminio Heads ([id number]) -------------------------------------------------------------------------------- Physical Exam Details Patient Name: KESLIE, GRITZ. Date of Service: 01/01/2022 3:30 PM Medical Record Number: [id number] Patient Account Number: [id number] Date of Birth/Sex: 05-Mar-1986 (36 y.o. F) Treating RN: Carlene Coria Primary Care Provider: [name] Other Clinician: [name] Referring Provider: [name] Treating Provider/Extender: Skipper Cliche in Treatment: 9 Constitutional Obese and well-hydrated in no acute distress. Respiratory normal breathing without difficulty. Psychiatric this patient is able to make decisions and demonstrates good insight into disease process. Alert and Oriented x 3. pleasant and cooperative. Notes Upon inspection patient's wound bed showed evidence of good granulation and epithelization at this point. Fortunately I see no signs of active infection which is great and overall I am extremely pleased with where we stand today. There is no evidence of  systemic infection either and I am very pleased with the progress made. Electronic Signature(s) Signed: 01/01/2022 4:13:19 PM By: Worthy Keeler PA-C Entered By: Worthy Keeler on 01/01/2022 16:13:19 Marez, Herminio Heads ([id number]) -------------------------------------------------------------------------------- Physician Orders Details Patient Name: LENORE, MOYANO. Date of Service: 01/01/2022 3:30 PM Medical Record Number: [id number] Patient Account Number: [id number] Date of Birth/Sex: 1985-10-19 (36 y.o. F) Treating RN: Carlene Coria Primary Care Provider: [name] Other Clinician: [name] Referring Provider: [name] Treating Provider/Extender: Skipper Cliche in Treatment: 9 Verbal / Phone Orders: No Diagnosis Coding ICD-10 Coding Code Description I83.028 Varicose veins of left lower extremity with ulcer other part of lower leg I89.0 Lymphedema, not elsewhere classified L97.822 Non-pressure chronic ulcer of other part of left lower leg with fat layer exposed E66.01 Morbid (severe) obesity due to excess calories Follow-up Appointments o Return Appointment in 1 week. Bathing/ Shower/ Hygiene o May shower; gently cleanse wound with antibacterial soap, rinse and pat dry prior to dressing wounds Anesthetic (Use 'Patient Medications' Section for Anesthetic Order Entry) o Lidocaine applied to wound bed Edema Control - Lymphedema / Segmental Compressive Device / Other o Tubigrip double layer applied - size E left leg o Elevate, Exercise Daily and Avoid Standing for Long Periods of Time. o Elevate legs to the level of the heart and pump ankles as often as possible o Elevate leg(s) parallel to the floor when sitting. Wound Treatment Wound #4 - Lower Leg Wound Laterality: Left, Medial Cleanser: Byram Ancillary Kit - 15 Day Supply (Generic) 3 x Per Week/30 Days Discharge Instructions: Use supplies as instructed; Kit contains: (15) Saline Bullets;  (15) 3x3 Gauze; 15 pr Gloves Cleanser: Soap and Water 3 x Per Week/30 Days Discharge Instructions: Gently cleanse wound with antibacterial soap, rinse and pat dry prior to dressing wounds Primary Dressing: IODOFLEX 0.9% Cadexomer Iodine Pad (Generic) 3 x Per Week/30 Days Discharge Instructions: Apply Iodoflex to wound bed only as directed. Secondary Dressing: (BORDER) Zetuvit Plus SILICONE BORDER Dressing 4x4 (in/in) (Generic) 3 x Per Week/30 Days Discharge Instructions: Please do not put silicone bordered dressings under wraps. Use non-bordered dressing only. Compression Wrap: tubi grip 3 x Per Week/30 Days Discharge Instructions: size E double layer Electronic Signature(s) Signed: 01/01/2022 4:49:05 PM By: Worthy Keeler PA-C Signed: 01/04/2022 11:41:41 AM By: [name] Entered By: [name] on 01/01/2022 16:11:21 Ulysse, Herminio Heads ([id number]) -------------------------------------------------------------------------------- Problem List Details Patient Name: KARSTEN, VAUGHN. Date of Service: 01/01/2022 3:30 PM Medical  Record Number: [id number] Patient Account Number: [id number] Date of Birth/Sex: 09/18/1985 (36 y.o. F) Treating RN: Carlene Coria Primary Care Provider: [name] Other Clinician: [name] Referring Provider: [name] Treating Provider/Extender: Skipper Cliche in Treatment: 9 Active Problems ICD-10 Encounter Code Description Active Date MDM Diagnosis I83.028 Varicose veins of left lower extremity with ulcer other part of lower leg 10/27/2021 No Yes I89.0 Lymphedema, not elsewhere classified 10/27/2021 No Yes L97.822 Non-pressure chronic ulcer of other part of left lower leg with fat layer 10/27/2021 No Yes exposed E66.01 Morbid (severe) obesity due to excess calories 10/27/2021 No Yes Inactive Problems Resolved Problems Electronic Signature(s) Signed: 01/01/2022 4:05:20 PM By: Worthy Keeler PA-C Entered By: Worthy Keeler on  01/01/2022 16:05:20 Bourbon, Herminio Heads ([id number]) -------------------------------------------------------------------------------- Progress Note Details Patient Name: [name] Date of Service: 01/01/2022 3:30 PM Medical Record Number: [id number] Patient Account Number: [id number] Date of Birth/Sex: [birth year] (36 y.o. F) Treating RN: Carlene Coria Primary Care Provider: [name] Other Clinician: [name] Referring Provider: [name] Treating Provider/Extender: Skipper Cliche in Treatment: 9 Subjective Chief Complaint Information obtained from Patient Left LE Ulcer History of Present Illness (HPI) 36 year old patient who started with having ulcerations on the right lower leg on the lateral part of her ankle for about 2 weeks. She was seen in the ER at [facility] and advised to see the wound care for a consultation. No X-rays of workup was done during the ER visit and no prescription for any medications of compression wraps were given. the patient is not diabetic but does have hypertension and her medications have been reviewed by me. 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 The right and left greater saphenous vein was found to be tortuous. Deep venous system was also not competent and there was reflux of more than 500 ms. She was then seen by Dr. Sherren Mocha Early who recommended that the patient would not benefit from endovenous ablation and he had recommended vein stripping on the right side and multiple small phlebectomy procedures on the left side. the patient did not follow-up due to social economic reasons. She has not been wearing any compression stockings and has not taken any specific treatment for varicose veins for the last 3 years. 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 We have obtained some appointments to see the vascular surgeons by the end of the month and the patient would like to follow up with me at my [facility] on Wednesday, June 29. 10/14/2014 -- she could not see me yesterday in [place] and hence has come for a review today. She has a vascular workup to be done this afternoon at [facility]. She is doing fine otherwise. 10/22/2014 -- she was seen by Dr. Curt Jews and he has recommended surgical removal of her right saphenous vein from distal thigh to saphenofemoral junction and stab phlebectomy's of multiple large tributary branches throughout her thigh and calf. This would be done under general anesthesia in the outpatient setting. 10/29/2014 -- she is trying to work on a surgical date and in the meanwhile we have got insurance clearance for Apligraf and we will start this next week. 7/22 2016 -- she is here for the first  application of Apligraf. 11/19/2014 -- she is here for a second application of Apligraf 11/26/2014 -- she has done fine after her last application of Apligraf and is awaiting her surgery which is scheduled for August 31. 12/03/2014 -- she is doing fine and is here for her third application of Apligraf. 12/21/2014 -- She had surgery on 12/15/2014 by Dr.Early who did #1 ligation and stripping of right great saphenous vein from distal thigh to saphenofemoral junction, #2 stab phlebectomy of large tributary varicose veins in the thigh popliteal space and calf. She had an Ace wrap up to her groin and this was removed today and the Unna's boot was also removed. 12/28/2014 -- she is here for her fourth application of Apligraf. 01/06/2015 - he saw her vascular surgeon Dr. Donnetta Hutching who was pleased with her progress and he has confirmed that no surgical procedures could be attempted on the left side. 01/13/2015 -- her wound looks very good  and she's been having no problems whatsoever. Readmission: 07/26/2020 upon evaluation today patient presents for initial inspection here in our clinic for a new issue with her left leg although she is previously been seen due to issues with the right leg back in 2016. At that time she was seeing Dr. Donnetta Hutching who is a vein/vascular specialist in [place]. He has since semiretired from what I understand. He is working out of [employer] I believe. Nonetheless she tells me at the time that there was really nothing to do for her left leg although the right leg was where they did most of the work. Subsequently she states that she is done fairly well until just in the past week where she had issues with bleeding from what appears to be varicose vein on the left leg medially. Unfortunately this has continued to be an issue although she tells me at first it was coming much more significantly Down quite a bit but nonetheless has not completely resolved. Every time she showers she notices that it starts to drain a little bit more. She does have a history of chronic venous insufficiency, lymphedema, varicose veins bilaterally, and obesity. 08/02/2020 upon evaluation today patient appears to be doing about the same in regards to the ulcer on her left leg. She has some eschar covering there is definitely some fluid collecting underneath unfortunately. With that being said she tells me she is still having a tremendous amount of pain therefore she is really not able to allow me to clean this off very effectively to be perfectly honest. I think we need to try to soften this up 08/16/2020 upon evaluation today patient's wound is really not doing significantly better not really states about the same. She notes that the wrap just does not seem to be staying up very well at all unfortunately. No fevers, chills, nausea, vomiting, or diarrhea. She did cut it off once it starts to slide in order to alleviate some of the pressure  from sliding Down. Fortunately there is no signs of active infection at this time which is great news. MAILIN, COGLIANESE ([id number]) 08/23/2020 upon evaluation today patient appears to be doing well 08/23/2020 upon evaluation today patient appears to be doing well with regard to her wound all things considered. Fortunately there does not appear to be any signs of active infection at this time which is great news. She has been tolerating the dressing changes without complication and overall I am extremely pleased with where things stand at this point. She does have her appointment with  vascular in [place] on June 9. 08/30/2020 upon evaluation today patient actually appears to be doing decently well in regard to her wound. Fortunately there is no signs of active infection which is great news. Nonetheless I do believe that the patient is going require little bit of debridement if she is okay with me attempting that today I think that will help clean off some of the necrotic tissue. Fortunately there does not appear to be otherwise any evidence of active infection which is also great news. 09/19/2020 upon evaluation today patient appears to be doing a little better in regard to her wound as compared to previous. Fortunately there does not appear to be any signs of active infection overall. No fever chills noted. I do believe that the Iodosorb has made this a little bit better with regard to the overall size and appearance of the wound bed though again she does still have quite a ways to go to get this to heal she still very tender to touch. 09/27/2020 upon evaluation today patient appears to actually be doing quite well with regard to her wound. This is measuring much smaller which is great news. With that being said she did see vein and vascular in [facility] and they subsequently recommended that surgery is really what she probably needs to go forward with sounds like the potential for venous ablation.  With that being said the patient tells me this is just not the right time for her to be able to proceed with any type of surgery which I completely understand. Nonetheless I do believe that she would continue to benefit from compression but again that is really not something that she is able to easily do. 10/04/2020 upon evaluation today patient appears to be doing about the same in regard to her wound. This is measuring a little bit smaller but nonetheless still is open and again has some slough and biofilm noted on the surface of the wound. There does not appear to be any signs of active infection which is great news. No fevers, chills, nausea, vomiting, or diarrhea. 10/04/2020 upon evaluation today patient appears to be doing well with regard to her wound. She has been tolerating dressing changes without complication. Fortunately there does not appear to be any signs of active infection which is great news. No fevers, chills, nausea, vomiting, or diarrhea. 10/25/2020 upon evaluation today patient appears to be doing well with regard to her wound. She has been tolerating the dressing changes without complication. Fortunately there is no signs of active infection at this time. No fevers, chills, nausea, vomiting, or diarrhea. 11/01/2020 upon evaluation today patient with regard to her wound. She has been tolerating the dressing changes without complication. Fortunately there does not appear to be any signs of infection which is great news. No fever chills noted 11/15/2020 upon evaluation today patient appears to be doing well with regard to her wound. Fortunately there is no signs of active infection at this time. No fevers, chills, nausea, vomiting, or diarrhea. With that being said she continues to have a significant amount of pain at the site even though this is very close to complete closure. She also had several varicose veins around the area which were also problematic. Overall however I feel like  the patient is making excellent progress. 11/28/2020 upon evaluation today patient appears to be doing well with regard to her leg ulcer. Again were not really able to debride or compression wrap her due to discomfort and pain. She does  not allow for that. With that being said we have been using Iodosorb which does seem to be doing decently well. Fortunately there is no signs of active infection at this time which is great news. No fevers, chills, nausea, vomiting, or diarrhea. 8/31; patient presents for 2-week follow-up. She has been using Iodosorb. She reports that the wound is closed. She denies signs of infection. Readmission: 10-27-2021 upon evaluation this is a patient that presents today whom I have previously seen this is pretty much for the same issue though I think a little bit higher than the last time I saw her. She does have a history of chronic venous insufficiency and hypertension along with varicose veins. Subsequently she does have an ulceration which spontaneously ruptured she has not been wearing any compression which I think is a big part of the issue here. We discussed this before I really think she probably needs to be wearing her compression therapy, she probably needs lymphedema pumps if she can ever wear the compression for a significant amount of time to get these, and subsequently also think that she needs to be elevating her legs is much as possible she may even need some vascular intervention in regard to her veins. All of this was reiterated and discussed with her today to reinforce what needs to happen in order to ensure that her legs do not get a lot worse. The patient voiced understanding. She tells me that she knows because she is seeing her mom go through a lot of this as well how bad things can get. 11-03-2021 upon evaluation today patient appears to be doing well with regard to her wound. Fortunately there does not appear to be any signs of active infection at this  time. She is measuring a little bit bigger but I think this is because the wound is actually cleaning up a bit here. 7/27; left lateral leg wound not any smaller but perhaps with a cleaner surface. She is using Iodoflex to help with the latter and using Tubigrip. She has chronic venous insufficiency with secondary lymphedema. She is apparently followed by vein and vascular and is being scheduled for an ablation 11-17-2021 upon evaluation today patient appears to be doing well with regard to her wound this is actually showing signs of improvement which is great news. Fortunately I do not see any evidence of active infection locally or systemically at this time which is great news. No fevers, chills, nausea, vomiting, or diarrhea. 11-24-2021 upon evaluation today patient's wound is actually showing signs of significant improvement. Unfortunately she had quite a bit of pain with debridement last week I do believe it was beneficial but at the same time she is doing much better but still really does not want this debrided again I think being that it is looking a whole lot better I would try to avoid that today especially since it caused her so much discomfort that is really not the goal and I explained that to the patient today she voiced understanding and knows that it needed to be done but still states that it was quite painful. 11-30-2021 upon evaluation today patient appears to be doing excellent in regard to her wound this is actually showing signs of excellent improvement I am very pleased with where things stand. She does have her venous ablation appointment for October 17. 12-21-2021 upon evaluation today patient appears to be doing better in regard to her wound this is measuring smaller and looking better as well. Fortunately I  do not see any signs of active infection locally or systemically which is great news. 01-01-2022 upon evaluation today patient appears to be doing well currently in regard to her  wound. She is showing signs of improvement which is SEHER, SCHLAGEL. ([id number]) great news and overall I do not see any signs of active infection locally or systemically at this time. Objective Constitutional Obese and well-hydrated in no acute distress. Vitals Time Taken: 3:49 PM, Height: 66 in, Weight: 400 lbs, BMI: 64.6, Temperature: 98.7 F, Pulse: 94 bpm, Respiratory Rate: 16 breaths/min, Blood Pressure: 138/85 mmHg. Respiratory normal breathing without difficulty. Psychiatric this patient is able to make decisions and demonstrates good insight into disease process. Alert and Oriented x 3. pleasant and cooperative. General Notes: Upon inspection patient's wound bed showed evidence of good granulation and epithelization at this point. Fortunately I see no signs of active infection which is great and overall I am extremely pleased with where we stand today. There is no evidence of systemic infection either and I am very pleased with the progress made. Integumentary (Hair, Skin) Wound #4 status is Open. Original cause of wound was Gradually Appeared. The date acquired was: 10/17/2021. The wound has been in treatment 9 weeks. The wound is located on the Left,Medial Lower Leg. The wound measures 0.6cm length x 0.7cm width x 0.2cm depth; 0.33cm^2 area and 0.066cm^3 volume. There is Fat Layer (Subcutaneous Tissue) exposed. There is a medium amount of serosanguineous drainage noted. There is medium (34-66%) red granulation within the wound bed. There is a medium (34-66%) amount of necrotic tissue within the wound bed including Adherent Slough. 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 Bathing/ Shower/  Hygiene: May shower; gently cleanse wound with antibacterial soap, rinse and pat dry prior to dressing wounds Anesthetic (Use 'Patient Medications' Section for Anesthetic Order Entry): Lidocaine applied to wound bed Edema Control - Lymphedema / Segmental Compressive Device / Other: Tubigrip double layer applied - size E left leg Elevate, Exercise Daily and Avoid Standing for Long Periods of Time. Elevate legs to the level of the heart and pump ankles as often as possible Elevate leg(s) parallel to the floor when sitting. WOUND #4: - Lower Leg Wound Laterality: Left, Medial Cleanser: Byram Ancillary Kit - 15 Day Supply (Generic) 3 x Per Week/30 Days Discharge Instructions: Use supplies as instructed; Kit contains: (15) Saline Bullets; (15) 3x3 Gauze; 15 pr Gloves Cleanser: Soap and Water 3 x Per Week/30 Days Discharge Instructions: Gently cleanse wound with antibacterial soap, rinse and pat dry prior to dressing wounds Primary Dressing: IODOFLEX 0.9% Cadexomer Iodine Pad (Generic) 3 x Per Week/30 Days Discharge Instructions: Apply Iodoflex to wound bed only as directed. FRANKLIN, CLAPSADDLE ([id number]) Secondary Dressing: (BORDER) Zetuvit Plus SILICONE BORDER Dressing 4x4 (in/in) (Generic) 3 x Per Week/30 Days Discharge Instructions: Please do not put silicone bordered dressings under wraps. Use non-bordered dressing only. Compression Wrap: tubi grip 3 x Per Week/30 Days Discharge Instructions: size E double layer 1. I am going to suggest that we go ahead and continue with the wound care measures as before and the patient is in agreement the plan. This includes the use of the Iodoflex/Iodosorb which does seem to be doing well. 2. I am also going to  recommend that we continue with the Zetuvit bordered foam dressing to cover. 3. She is also getting continue with the Tubigrip which seems to be doing well for her currently. 4. I am also going to suggest that we still maintain this holding pattern  until she has the vascular procedure that is October 19. With that being said in the meantime we will try to do what we can to keep things moving in the right direction. We will see patient back for reevaluation in 1 week here in the clinic. If anything worsens or changes patient will contact our office for additional recommendations. Electronic Signature(s) Signed: 01/01/2022 4:14:13 PM By: Worthy Keeler PA-C Entered By: Worthy Keeler on 01/01/2022 16:14:12 Boomer, Herminio Heads ([id number]) -------------------------------------------------------------------------------- SuperBill Details Patient Name: NIKISHA, FLEECE Date of Service: 01/01/2022 Medical Record Number: [id number] Patient Account Number: [id number] Date of Birth/Sex: Aug 26, 1985 (36 y.o. F) Treating RN: Carlene Coria Primary Care Provider: [name] Other Clinician: [name] Referring Provider: [name] Treating Provider/Extender: Skipper Cliche in Treatment: 9 Diagnosis Coding ICD-10 Codes Code Description I83.028 Varicose veins of left lower extremity with ulcer other part of lower leg I89.0 Lymphedema, not elsewhere classified L97.822 Non-pressure chronic ulcer of other part of left lower leg with fat layer exposed E66.01 Morbid (severe) obesity due to excess calories Facility Procedures CPT4 Code: 07371062 Description: 99213 - WOUND CARE VISIT-LEV 3 EST PT Modifier: Quantity: 1 Physician Procedures CPT4 Code: 6948546 Description: 27035 - WC PHYS LEVEL 3 - EST PT Modifier: Quantity: 1 CPT4 Code: Description: ICD-10 Diagnosis Description I83.028 Varicose veins of left lower extremity with ulcer other part of lower l I89.0 Lymphedema, not elsewhere classified L97.822 Non-pressure chronic ulcer of other part of left lower leg with fat lay E66.01  Morbid (severe) obesity due to excess calories Modifier: eg er exposed Quantity: Electronic Signature(s) Signed: 01/01/2022 4:14:28 PM By: Worthy Keeler PA-C Entered By: Worthy Keeler on 01/01/2022 16:14:27

## 2022-01-05 NOTE — Progress Notes (Signed)
CALLIOPE, DELANGEL ([id number]) Visit Report for 01/01/2022 Arrival Information Details Patient Name: [name], [name]. Date of Service: 01/01/2022 3:30 PM Medical Record Number: [id number] Patient Account Number: [id number] Date of Birth/Sex: 08-Sep-1985 (36 y.o. F) Treating RN: Carlene Coria Primary Care Daichi Moris: [name] Other Clinician: [name] Referring Ark Agrusa: [name] Treating Davianna Deutschman/Extender: Skipper Cliche in Treatment: 9 Visit Information History Since Last Visit All ordered tests and consults were completed: No Patient Arrived: Ambulatory Added or deleted any medications: No Arrival Time: 15:48 Any new allergies or adverse reactions: No Transfer Assistance: None Had a fall or experienced change in No Patient Requires Transmission-Based Precautions: No activities of daily living that may affect Patient Has Alerts: No risk of falls: Hospitalized since last visit: No Pain Present Now: Yes Electronic Signature(s) Signed: 01/04/2022 11:41:41 AM By: [name] Entered By: [name] on 01/01/2022 15:48:52 Lebeda, Herminio Heads ([id number]) -------------------------------------------------------------------------------- Clinic Level of Care Assessment Details Patient Name: [name] Date of Service: 01/01/2022 3:30 PM Medical Record Number: [id number] Patient Account Number: [id number] Date of Birth/Sex: 12-18-1985 (36 y.o. F) Treating RN: Carlene Coria Primary Care Daleena Rotter: [name] Other Clinician: [name] Referring Nastassia Bazaldua: [name] Treating Seith Aikey/Extender: Skipper Cliche in Treatment: 9 Clinic Level of Care Assessment Items TOOL 4 Quantity Score _0  - Use when only an EandM is performed on FOLLOW-UP visit 0 ASSESSMENTS - Nursing Assessment / Reassessment X - Reassessment of Co-morbidities (includes updates in patient status) 1 10 X- 1 5 Reassessment of Adherence to Treatment Plan ASSESSMENTS - Wound  and Skin Assessment / Reassessment X - Simple Wound Assessment / Reassessment - one wound 1 5 _1  - 0 Complex Wound Assessment / Reassessment - multiple wounds _2  - 0 Dermatologic / Skin Assessment (not related to wound area) ASSESSMENTS - Focused Assessment _3  - Circumferential Edema Measurements - multi extremities 0 _4  - 0 Nutritional Assessment / Counseling / Intervention _5  - 0 Lower Extremity Assessment (monofilament, tuning fork, pulses) _6  - 0 Peripheral Arterial Disease Assessment (using hand held doppler) ASSESSMENTS - Ostomy and/or Continence Assessment and Care _7  - Incontinence Assessment and Management 0 _8  - 0 Ostomy Care Assessment and Management (repouching, etc.) PROCESS - Coordination of Care X - Simple Patient / Family Education for ongoing care 1 15 _9  - 0 Complex (extensive) Patient / Family Education for ongoing care _10  - 0 Staff obtains Programmer, systems, Records, Test Results / Process Orders _11  - 0 Staff telephones HHA, Nursing Homes / Clarify orders / etc _12  - 0 Routine Transfer to another Facility (non-emergent condition) _13  - 0 Routine Hospital Admission (non-emergent condition) _14  - 0 New Admissions / Biomedical engineer / Ordering NPWT, Apligraf, etc. _15  - 0 Emergency Hospital Admission (emergent condition) X- 1 10 Simple Discharge Coordination _16  - 0 Complex (extensive) Discharge Coordination PROCESS - Special Needs _17  - Pediatric / Minor Patient Management 0 _18  - 0 Isolation Patient Management _19  - 0 Hearing / Language / Visual special needs _20  - 0 Assessment of Community assistance (transportation, D/C planning, etc.) _21  - 0 Additional assistance / Altered mentation _22  - 0 Support Surface(s) Assessment (bed, cushion, seat, etc.) INTERVENTIONS - Wound Cleansing / Measurement Brereton, Aariyana J. ([id number]) X- 1 5 Simple Wound Cleansing - one wound _23  - 0 Complex Wound Cleansing - multiple wounds X- 1 5 Wound Imaging (photographs - any  number of wounds) _24  - 0 Wound Tracing (instead of photographs) X- 1 5 Simple Wound Measurement - one wound _25  - 0 Complex Wound Measurement -  multiple wounds INTERVENTIONS - Wound Dressings _0  - Small Wound Dressing one or multiple wounds 0 X- 1 15 Medium Wound Dressing one or multiple wounds _1  - 0 Large Wound Dressing one or multiple wounds X- 1 5 Application of Medications - topical <[id number]>_1<\/HYQMVHQIONGEXBMW>_4  - 0 Application of Medications - injection INTERVENTIONS - Miscellaneous _3  - External ear exam 0 _4  - 0 Specimen Collection (cultures, biopsies, blood, body fluids, etc.) _5  - 0 Specimen(s) / Culture(s) sent or taken to Lab for analysis _6  - 0 Patient Transfer (multiple staff / Harrel Lemon Lift / Similar devices) _7  - 0 Simple Staple / Suture removal (25 or less) _8  - 0 Complex Staple / Suture removal (26 or more) _9  - 0 Hypo / Hyperglycemic Management (close monitor of Blood Glucose) _10  - 0 Ankle / Brachial Index (ABI) - do not check if billed separately X- 1 5 Vital Signs Has the patient been seen at the hospital within the last three years: Yes Total Score: 85 Level Of Care: New/Established - Level 3 Electronic Signature(s) Signed: 01/04/2022 11:41:41 AM By: [name] Entered By: [name] on 01/01/2022 16:11:54 Upadhyay, Herminio Heads ([id number]) -------------------------------------------------------------------------------- Encounter Discharge Information Details Patient Name: KHALIDAH, HERBOLD. Date of Service: 01/01/2022 3:30 PM Medical Record Number: [id number] Patient Account Number: [id number] Date of Birth/Sex: 1986/01/27 (36 y.o. F) Treating RN: Carlene Coria Primary Care Josefine Fuhr: [name] Other Clinician: [name] Referring Keturah Yerby: [name] Treating Hali Balgobin/Extender: Skipper Cliche in Treatment: 9 Encounter Discharge Information Items Discharge Condition: Stable Ambulatory Status: Ambulatory Discharge Destination: Home Transportation:  Private Auto Accompanied By: self Schedule Follow-up Appointment: Yes Clinical Summary of Care: Electronic Signature(s) Signed: 01/04/2022 11:41:41 AM By: [name] Entered By: [name] on 01/01/2022 16:20:39 Zietz, Herminio Heads ([id number]) -------------------------------------------------------------------------------- Lower Extremity Assessment Details Patient Name: ANJANI, FEUERBORN Date of Service: 01/01/2022 3:30 PM Medical Record Number: [id number] Patient Account Number: [id number] Date of Birth/Sex: [birth year] (36 y.o. F) Treating RN: Carlene Coria Primary Care Sharolyn Weber: [name] Other Clinician: [name] Referring Demone Lyles: [name] Treating Mira Balon/Extender: Jeri Cos Weeks in Treatment: 9 Edema Assessment Assessed: Shirlyn Goltz: Yes] Patrice Paradise: No] Edema: [Left: Ye] [Right: s] Calf Left: Right: Point of Measurement: 31 cm From Medial Instep 57.4 cm Ankle Left: Right: Point of Measurement: 10 cm From Medial Instep 33.4 cm Vascular Assessment Pulses: Dorsalis Pedis Palpable: [Left:Yes] Electronic Signature(s) Signed: 01/04/2022 11:41:41 AM By: [name] Signed: 01/05/2022 9:44:45 AM By: Carlene Coria RN Entered By: [name] on 01/01/2022 15:59:06 Heino, Herminio Heads ([id number]) -------------------------------------------------------------------------------- Multi Wound Chart Details Patient Name: SAYRA, FRISBY. Date of Service: 01/01/2022 3:30 PM Medical Record Number: [id number] Patient Account Number: [id number] Date of Birth/Sex: [birth year] (36 y.o. F) Treating RN: Carlene Coria Primary Care Daschel Roughton: [name] Other Clinician: [name] Referring Kariss Longmire: [name] Treating Bastien Strawser/Extender: Skipper Cliche in Treatment: 9 Vital Signs Height(in): 66 Pulse(bpm): 94 Weight(lbs): 400 Blood Pressure(mmHg): 138/85 Body Mass Index(BMI): 64.6 Temperature(F): 98.7 Respiratory Rate(breaths/min): 16 Photos:  [N/A:N/A] Wound Location: Left, Medial Lower Leg N/A N/A Wounding Event: Gradually Appeared N/A N/A Primary Etiology: Venous Leg Ulcer N/A N/A Comorbid History: Hypertension, Peripheral Venous N/A N/A Disease Date Acquired: 10/17/2021 N/A N/A Weeks of Treatment: 9 N/A N/A Wound Status: Open N/A N/A Wound Recurrence: No N/A N/A Measurements L x W x D (cm) 0.6x0.7x0.2 N/A N/A Area (cm) : 0.33 N/A N/A Volume (cm) : 0.066 N/A N/A % Reduction in Area: 40.00% N/A N/A % Reduction in Volume: -20.00% N/A N/A Classification: Full Thickness Without Exposed N/A  N/A Support Structures Exudate Amount: Medium N/A N/A Exudate Type: Serosanguineous N/A N/A Exudate Color: red, brown N/A N/A Granulation Amount: Medium (34-66%) N/A N/A Granulation Quality: Red N/A N/A Necrotic Amount: Medium (34-66%) N/A N/A Exposed Structures: Fat Layer (Subcutaneous Tissue): N/A N/A Yes Fascia: No Tendon: No Muscle: No Joint: No Bone: No Epithelialization: None N/A N/A Treatment Notes Electronic Signature(s) Signed: 01/04/2022 11:41:41 AM By: [name] Entered By: [name] on 01/01/2022 15:59:28 Herda, Herminio Heads ([id number]) -------------------------------------------------------------------------------- P[place]us Details Patient Name: HALEE, GLYNN. Date of Service: 01/01/2022 3:30 PM Medical Record Number: [id number] Patient Account Number: [id number] Date of Birth/Sex: [birth year] (36 y.o. F) Treating RN: Carlene Coria Primary Care Julina Altmann: [name] Other Clinician: [name] Referring Raylene Carmickle: [name] Treating Jadrien Narine/Extender: Skipper Cliche in Treatment: 9 Active Inactive Wound/Skin Impairment Nursing Diagnoses: Knowledge deficit related to ulceration/compromised skin integrity Goals: Patient/caregiver will verbalize understanding of skin care regimen Date Initiated: 10/27/2021 Target Resolution Date: 11/27/2021 Goal Status:  Active Ulcer/skin breakdown will have a volume reduction of 30% by week 4 Date Initiated: 10/27/2021 Target Resolution Date: 12/28/2021 Goal Status: Active Ulcer/skin breakdown will have a volume reduction of 50% by week 8 Date Initiated: 10/27/2021 Target Resolution Date: 01/27/2022 Goal Status: Active Ulcer/skin breakdown will have a volume reduction of 80% by week 12 Date Initiated: 10/27/2021 Target Resolution Date: 02/27/2022 Goal Status: Active Ulcer/skin breakdown will heal within 14 weeks Date Initiated: 10/27/2021 Target Resolution Date: 03/29/2022 Goal Status: Active Interventions: Assess patient/caregiver ability to obtain necessary supplies Assess patient/caregiver ability to perform ulcer/skin care regimen upon admission and as needed Assess ulceration(s) every visit Notes: Electronic Signature(s) Signed: 01/04/2022 11:41:41 AM By: [name] Signed: 01/05/2022 9:44:45 AM By: Carlene Coria RN Entered By: [name] on 01/01/2022 15:59:14 Kubota, Herminio Heads ([id number]) -------------------------------------------------------------------------------- Pain Assessment Details Patient Name: LORIJEAN, HUSSER. Date of Service: 01/01/2022 3:30 PM Medical Record Number: [id number] Patient Account Number: [id number] Date of Birth/Sex: [birth year] (36 y.o. F) Treating RN: Carlene Coria Primary Care Chela Sutphen: [name] Other Clinician: [name] Referring Ilyas Lipsitz: [name] Treating Darline Faith/Extender: Skipper Cliche in Treatment: 9 Active Problems Location of Pain Severity and Description of Pain Patient Has Paino Yes Site Locations Pain Location: Pain in Ulcers Duration of the Pain. Constant / Intermittento Intermittent Rate the pain. Current Pain Level: 5 Character of Pain Describe the Pain: Burning, Sharp Pain Management and Medication Current Pain Management: Medication: Yes Rest: Yes Electronic Signature(s) Signed: 01/04/2022 11:41:41 AM  By: [name] Signed: 01/05/2022 9:44:45 AM By: Carlene Coria RN Entered By: [name] on 01/01/2022 15:57:25 Xiao, Herminio Heads ([id number]) -------------------------------------------------------------------------------- Patient/Caregiver Education Details Patient Name: MARLETA, LAPIERRE. Date of Service: 01/01/2022 3:30 PM Medical Record Number: [id number] Patient Account Number: [id number] Date of Birth/Gender: Jan 15, 1986 (36 y.o. F) Treating RN: Carlene Coria Primary Care Physician: [name] Other Clinician: [name] Referring Physician: [name] Treating Physician/Extender: Skipper Cliche in Treatment: 9 Education Assessment Education Provided To: Patient Education Topics Provided Wound/Skin Impairment: Handouts: Other: continue wound care as directed Methods: Explain/Verbal Responses: State content correctly Electronic Signature(s) Signed: 01/04/2022 11:41:41 AM By: [name] Entered By: [name] on 01/01/2022 16:20:08 Pressman, Herminio Heads ([id number]) -------------------------------------------------------------------------------- Wound Assessment Details Patient Name: MADELLYN, DENIO. Date of Service: 01/01/2022 3:30 PM Medical Record Number: [id number] Patient Account Number: [id number] Date of Birth/Sex: [birth year] (36 y.o. F) Treating RN: Carlene Coria Primary Care Yusuf Yu: [name] Other Clinician: [name] Referring Kateline Kinkade: [name] Treating Sahvannah Rieser/Extender: Skipper Cliche in Treatment: 9  Wound Status Wound Number: 4 Primary Etiology: Venous Leg Ulcer Wound Location: Left, Medial Lower Leg Wound Status: Open Wounding Event: Gradually Appeared Comorbid History: Hypertension, Peripheral Venous Disease Date Acquired: 10/17/2021 Weeks Of Treatment: 9 Clustered Wound: No Photos Wound Measurements Length: (cm) 0.6 Width: (cm) 0.7 Depth: (cm) 0.2 Area: (cm) 0.33 Volume: (cm) 0.066 % Reduction  in Area: 40% % Reduction in Volume: -20% Epithelialization: None Wound Description Classification: Full Thickness Without Exposed Support Structures Exudate Amount: Medium Exudate Type: Serosanguineous Exudate Color: red, brown Foul Odor After Cleansing: No Slough/Fibrino No Wound Bed Granulation Amount: Medium (34-66%) Exposed Structure Granulation Quality: Red Fascia Exposed: No Necrotic Amount: Medium (34-66%) Fat Layer (Subcutaneous Tissue) Exposed: Yes Necrotic Quality: Adherent Slough Tendon Exposed: No Muscle Exposed: No Joint Exposed: No Bone Exposed: No Treatment Notes Wound #4 (Lower Leg) Wound Laterality: Left, Medial Cleanser Byram Ancillary Kit - 15 Day Supply Discharge Instruction: Use supplies as instructed; Kit contains: (15) Saline Bullets; (15) 3x3 Gauze; 15 pr Gloves Soap and Water Discharge Instruction: Gently cleanse wound with antibacterial soap, rinse and pat dry prior to dressing wounds Gobert, Lyndsay J. ([id number]) Peri-Wound Care Topical Primary Dressing IODOFLEX 0.9% Cadexomer Iodine Pad Discharge Instruction: Apply Iodoflex to wound bed only as directed. Secondary Dressing (BORDER) Zetuvit Plus SILICONE BORDER Dressing 4x4 (in/in) Discharge Instruction: Please do not put silicone bordered dressings under wraps. Use non-bordered dressing only. Secured With Compression Wrap tubi grip Discharge Instruction: size E double layer Compression Stockings Add-Ons Electronic Signature(s) Signed: 01/04/2022 11:41:41 AM By: [name] Signed: 01/05/2022 9:44:45 AM By: Carlene Coria RN Entered By: [name] on 01/01/2022 15:57:52 Feick, Herminio Heads ([id number]) -------------------------------------------------------------------------------- Vitals Details Patient Name: KAILY, WRAGG. Date of Service: 01/01/2022 3:30 PM Medical Record Number: [id number] Patient Account Number: [id number] Date of Birth/Sex: [birth year] (36 y.o. F) Treating RN:  Carlene Coria Primary Care Anfernee Peschke: [name] Other Clinician: [name] Referring Shawnn Bouillon: [name] Treating Addisyn Leclaire/Extender: Skipper Cliche in Treatment: 9 Vital Signs Time Taken: 15:49 Temperature (F): 98.7 Height (in): 66 Pulse (bpm): 94 Weight (lbs): 400 Respiratory Rate (breaths/min): 16 Body Mass Index (BMI): 64.6 Blood Pressure (mmHg): 138/85 Reference Range: 80 - 120 mg / dl Electronic Signature(s) Signed: 01/04/2022 11:41:41 AM By: [name] Entered By: [name] on 01/01/2022 15:51:27

## 2022-01-07 ENCOUNTER — Emergency Department (HOSPITAL_COMMUNITY)
Admission: EM | Admit: 2022-01-07 | Discharge: 2022-01-08 | Discharge status: Against Medical Advice | Payer: BC Managed Care – PPO | Attending: Emergency Medicine | Admitting: Emergency Medicine

## 2022-01-07 ENCOUNTER — Encounter (HOSPITAL_COMMUNITY): Payer: Self-pay

## 2022-01-07 ENCOUNTER — Other Ambulatory Visit: Payer: Self-pay

## 2022-01-07 DIAGNOSIS — R0602 Shortness of breath: Secondary | ICD-10-CM | POA: Diagnosis not present

## 2022-01-07 DIAGNOSIS — M549 Dorsalgia, unspecified: Secondary | ICD-10-CM | POA: Insufficient documentation

## 2022-01-07 DIAGNOSIS — Z5321 Procedure and treatment not carried out due to patient leaving prior to being seen by health care provider: Secondary | ICD-10-CM | POA: Diagnosis not present

## 2022-01-07 DIAGNOSIS — Z20822 Contact with and (suspected) exposure to covid-19: Secondary | ICD-10-CM | POA: Diagnosis not present

## 2022-01-07 LAB — RESP PANEL BY RT-PCR (FLU A&B, COVID) ARPGX2
Influenza A by PCR: NEGATIVE
Influenza B by PCR: NEGATIVE
SARS Coronavirus 2 by RT PCR: NEGATIVE

## 2022-01-07 NOTE — ED Provider Triage Note (Signed)
Emergency Medicine Provider Triage Evaluation Note  SHAIDA ROUTE , a 36 y.o. female  was evaluated in triage.  Pt complains of right-sided back pain.  She says started on Friday.  Patient lives objects at work all day.  Reports movement makes it worse.  Describes it as a dull ache.  States the pain makes it difficult to breathe at times.  Review of Systems  Positive: As above Negative: Fevers, chills, saddle paresthesias, bowel or bladder incontinence  Physical Exam  BP (!) 139/100 (BP Location: Left Wrist)   Pulse 100   Temp 100 F (37.8 C) (Oral)   Resp 17   Ht 5\' 6"  (1.676 m)   Wt (!) 181.4 kg   SpO2 100%   BMI 64.56 kg/m  Gen:   Awake, no distress   Resp:  Normal effort  MSK:   Moves extremities without difficulty  Other:  Mild tenderness to palpation of the right lower back  Medical Decision Making  Medically screening exam initiated at 9:34 PM.  Appropriate orders placed.  [name] was informed that the remainder of the evaluation will be completed by another provider, this initial triage assessment does not replace that evaluation, and the importance of remaining in the ED until their evaluation is complete.     Roylene Reason, [place] 01/07/22 2135

## 2022-01-07 NOTE — ED Triage Notes (Signed)
Pt reports with [facility] and back pain (right side) x 2 days. Pt states that when she moves a certain way it hurts.

## 2022-01-08 ENCOUNTER — Encounter: Payer: BC Managed Care – PPO | Admitting: Physician Assistant

## 2022-01-08 DIAGNOSIS — I83028 Varicose veins of left lower extremity with ulcer other part of lower leg: Secondary | ICD-10-CM | POA: Diagnosis not present

## 2022-01-08 NOTE — Progress Notes (Addendum)
HAWLEY, PAVIA ([id number]) Visit Report for 01/08/2022 Arrival Information Details Patient Name: [name], [name]. Date of Service: 01/08/2022 3:15 PM Medical Record Number: [id number] Patient Account Number: [id number] Date of Birth/Sex: Mar 25, 1986 (36 y.o. F) Treating RN: Carlene Coria Primary Care See Beharry: [name] Other Clinician: Referring Dyamon Sosinski: [name] Treating Markas Aldredge/Extender: Skipper Cliche in Treatment: 10 Visit Information History Since Last Visit All ordered tests and consults were completed: No Patient Arrived: Ambulatory Added or deleted any medications: No Arrival Time: 15:32 Any new allergies or adverse reactions: No Accompanied By: self Had a fall or experienced change in No Transfer Assistance: None activities of daily living that may affect Patient Requires Transmission-Based Precautions: No risk of falls: Patient Has Alerts: No Signs or symptoms of abuse/neglect since last visito No Hospitalized since last visit: No Implantable device outside of the clinic excluding No cellular tissue based products placed in the center since last visit: Has Dressing in Place as Prescribed: Yes Has Compression in Place as Prescribed: Yes Pain Present Now: No Electronic Signature(s) Signed: 01/08/2022 4:20:15 PM By: Carlene Coria RN Entered By: Carlene Coria on 01/08/2022 15:35:46 Oconnell, Herminio Heads ([id number]) -------------------------------------------------------------------------------- Clinic Level of Care Assessment Details Patient Name: [name], [name] Date of Service: 01/08/2022 3:15 PM Medical Record Number: [id number] Patient Account Number: [id number] Date of Birth/Sex: 03/30/1986 (36 y.o. F) Treating RN: Carlene Coria Primary Care Remmy Crass: [name] Other Clinician: Referring Chellsie Gomer: [name] Treating Lanissa Cashen/Extender: Skipper Cliche in Treatment: 10 Clinic Level of Care Assessment Items TOOL 4 Quantity Score X -  Use when only an EandM is performed on FOLLOW-UP visit 1 0 ASSESSMENTS - Nursing Assessment / Reassessment X - Reassessment of Co-morbidities (includes updates in patient status) 1 10 X- 1 5 Reassessment of Adherence to Treatment Plan ASSESSMENTS - Wound and Skin Assessment / Reassessment X - Simple Wound Assessment / Reassessment - one wound 1 5 []  - 0 Complex Wound Assessment / Reassessment - multiple wounds []  - 0 Dermatologic / Skin Assessment (not related to wound area) ASSESSMENTS - Focused Assessment []  - Circumferential Edema Measurements - multi extremities 0 []  - 0 Nutritional Assessment / Counseling / Intervention []  - 0 Lower Extremity Assessment (monofilament, tuning fork, pulses) []  - 0 Peripheral Arterial Disease Assessment (using hand held doppler) ASSESSMENTS - Ostomy and/or Continence Assessment and Care []  - Incontinence Assessment and Management 0 []  - 0 Ostomy Care Assessment and Management (repouching, etc.) PROCESS - Coordination of Care X - Simple Patient / Family Education for ongoing care 1 15 []  - 0 Complex (extensive) Patient / Family Education for ongoing care []  - 0 Staff obtains Programmer, systems, Records, Test Results / Process Orders []  - 0 Staff telephones HHA, Nursing Homes / Clarify orders / etc []  - 0 Routine Transfer to another Facility (non-emergent condition) []  - 0 Routine Hospital Admission (non-emergent condition) []  - 0 New Admissions / Biomedical engineer / Ordering NPWT, Apligraf, etc. []  - 0 Emergency Hospital Admission (emergent condition) X- 1 10 Simple Discharge Coordination []  - 0 Complex (extensive) Discharge Coordination PROCESS - Special Needs []  - Pediatric / Minor Patient Management 0 []  - 0 Isolation Patient Management []  - 0 Hearing / Language / Visual special needs []  - 0 Assessment of Community assistance (transportation, D/C planning, etc.) []  - 0 Additional assistance / Altered mentation []  - 0 Support  Surface(s) Assessment (bed, cushion, seat, etc.) INTERVENTIONS - Wound Cleansing / Measurement Skarda, Miki J. ([id number]) X- 1 5 Simple Wound Cleansing - one wound []  -  0 Complex Wound Cleansing - multiple wounds X- 1 5 Wound Imaging (photographs - any number of wounds) []  - 0 Wound Tracing (instead of photographs) X- 1 5 Simple Wound Measurement - one wound []  - 0 Complex Wound Measurement - multiple wounds INTERVENTIONS - Wound Dressings X - Small Wound Dressing one or multiple wounds 1 10 []  - 0 Medium Wound Dressing one or multiple wounds []  - 0 Large Wound Dressing one or multiple wounds []  - 0 Application of Medications - topical []  - 0 Application of Medications - injection INTERVENTIONS - Miscellaneous []  - External ear exam 0 []  - 0 Specimen Collection (cultures, biopsies, blood, body fluids, etc.) []  - 0 Specimen(s) / Culture(s) sent or taken to Lab for analysis []  - 0 Patient Transfer (multiple staff / Civil Service fast streamer / Similar devices) []  - 0 Simple Staple / Suture removal (25 or less) []  - 0 Complex Staple / Suture removal (26 or more) []  - 0 Hypo / Hyperglycemic Management (close monitor of Blood Glucose) []  - 0 Ankle / Brachial Index (ABI) - do not check if billed separately X- 1 5 Vital Signs Has the patient been seen at the hospital within the last three years: Yes Total Score: 75 Level Of Care: New/Established - Level 2 Electronic Signature(s) Signed: 01/08/2022 4:20:15 PM By: Carlene Coria RN Entered By: Carlene Coria on 01/08/2022 16:05:45 Rosenfield, Herminio Heads ([id number]) -------------------------------------------------------------------------------- Encounter Discharge Information Details Patient Name: [name]. Date of Service: 01/08/2022 3:15 PM Medical Record Number: [id number] Patient Account Number: [id number] Date of Birth/Sex: [birth year] (35 y.o. F) Treating RN: Carlene Coria Primary Care Torra Pala: [name] Other  Clinician: Referring Ioma Chismar: [name] Treating Torrin [name]/Extender: Skipper Cliche in Treatment: 10 Encounter Discharge Information Items Discharge Condition: Stable Ambulatory Status: Ambulatory Discharge Destination: Home Transportation: Private Auto Accompanied By: self Schedule Follow-up Appointment: Yes Clinical Summary of Care: Electronic Signature(s) Signed: 01/08/2022 4:20:15 PM By: Carlene Coria RN Entered By: Carlene Coria on 01/08/2022 16:07:17 Viscuso, Herminio Heads ([id number]) -------------------------------------------------------------------------------- Lower Extremity Assessment Details Patient Name: EMILENE, ROMA Date of Service: 01/08/2022 3:15 PM Medical Record Number: [id number] Patient Account Number: [id number] Date of Birth/Sex: 1985/06/28 (36 y.o. F) Treating RN: Carlene Coria Primary Care Raneen Jaffer: [name] Other Clinician: Referring Breyer Tejera: [name] Treating Cherly Erno/Extender: Jeri Cos Weeks in Treatment: 10 Edema Assessment Assessed: Shirlyn Goltz: No] Patrice Paradise: No] Edema: [Left: Ye] [Right: s] Calf Left: Right: Point of Measurement: 31 cm From Medial Instep 61 cm Ankle Left: Right: Point of Measurement: 10 cm From Medial Instep 34 cm Vascular Assessment Pulses: Dorsalis Pedis Palpable: [Left:Yes] Electronic Signature(s) Signed: 01/08/2022 4:20:15 PM By: Carlene Coria RN Entered By: Carlene Coria on 01/08/2022 15:42:03 Berkley, Herminio Heads ([id number]) -------------------------------------------------------------------------------- Multi Wound Chart Details Patient Name: FRONNIE, URTON Date of Service: 01/08/2022 3:15 PM Medical Record Number: [id number] Patient Account Number: [id number] Date of Birth/Sex: 1986-02-05 (36 y.o. F) Treating RN: Carlene Coria Primary Care Wilbur Labuda: [name] Other Clinician: Referring Ahsha Hinsley: [name] Treating Gordon Vandunk/Extender: Skipper Cliche in Treatment: 10 Vital  Signs Height(in): 66 Pulse(bpm): 103 Weight(lbs): 400 Blood Pressure(mmHg): 127/83 Body Mass Index(BMI): 64.6 Temperature(F): 98.4 Respiratory Rate(breaths/min): 18 Photos: [N/A:N/A] Wound Location: Left, Medial Lower Leg N/A N/A Wounding Event: Gradually Appeared N/A N/A Primary Etiology: Venous Leg Ulcer N/A N/A Comorbid History: Hypertension, Peripheral Venous N/A N/A Disease Date Acquired: 10/17/2021 N/A N/A Weeks of Treatment: 10 N/A N/A Wound Status: Open N/A N/A Wound Recurrence: No N/A N/A Measurements L x W x D (cm) 0.4x0.5x0.2 N/A N/A  Area (cm) : 0.157 N/A N/A Volume (cm) : 0.031 N/A N/A % Reduction in Area: 71.50% N/A N/A % Reduction in Volume: 43.60% N/A N/A Classification: Full Thickness Without Exposed N/A N/A Support Structures Exudate Amount: Medium N/A N/A Exudate Type: Serosanguineous N/A N/A Exudate Color: red, brown N/A N/A Granulation Amount: Medium (34-66%) N/A N/A Granulation Quality: Red N/A N/A Necrotic Amount: Medium (34-66%) N/A N/A Exposed Structures: Fat Layer (Subcutaneous Tissue): N/A N/A Yes Fascia: No Tendon: No Muscle: No Joint: No Bone: No Epithelialization: Medium (34-66%) N/A N/A Treatment Notes Electronic Signature(s) Signed: 01/08/2022 4:20:15 PM By: Carlene Coria RN Entered By: Carlene Coria on 01/08/2022 15:42:21 Helmstetter, Herminio Heads ([id number]) -------------------------------------------------------------------------------- [place] Details Patient Name: BRYER, GOTTSCH. Date of Service: 01/08/2022 3:15 PM Medical Record Number: [id number] Patient Account Number: [id number] Date of Birth/Sex: [birth year] (36 y.o. F) Treating RN: Carlene Coria Primary Care Danney Bungert: [name] Other Clinician: Referring [place] Cinquemani: [name] Treating Rowyn Spilde/Extender: Skipper Cliche in Treatment: 10 Active Inactive Wound/Skin Impairment Nursing Diagnoses: Knowledge deficit related to ulceration/compromised  skin integrity Goals: Patient/caregiver will verbalize understanding of skin care regimen Date Initiated: 10/27/2021 Target Resolution Date: 11/27/2021 Goal Status: Active Ulcer/skin breakdown will have a volume reduction of 30% by week 4 Date Initiated: 10/27/2021 Target Resolution Date: 12/28/2021 Goal Status: Active Ulcer/skin breakdown will have a volume reduction of 50% by week 8 Date Initiated: 10/27/2021 Target Resolution Date: 01/27/2022 Goal Status: Active Ulcer/skin breakdown will have a volume reduction of 80% by week 12 Date Initiated: 10/27/2021 Target Resolution Date: 02/27/2022 Goal Status: Active Ulcer/skin breakdown will heal within 14 weeks Date Initiated: 10/27/2021 Target Resolution Date: 03/29/2022 Goal Status: Active Interventions: Assess patient/caregiver ability to obtain necessary supplies Assess patient/caregiver ability to perform ulcer/skin care regimen upon admission and as needed Assess ulceration(s) every visit Notes: Electronic Signature(s) Signed: 01/08/2022 4:20:15 PM By: Carlene Coria RN Entered By: Carlene Coria on 01/08/2022 15:42:14 Gipe, Herminio Heads ([id number]) -------------------------------------------------------------------------------- Pain Assessment Details Patient Name: ZAMYRA, ALLENSWORTH. Date of Service: 01/08/2022 3:15 PM Medical Record Number: [id number] Patient Account Number: [id number] Date of Birth/Sex: Nov 25, 1985 (36 y.o. F) Treating RN: Carlene Coria Primary Care Dwan Fennel: [name] Other Clinician: Referring Hager Compston: [name] Treating Kalyb Pemble/Extender: Skipper Cliche in Treatment: 10 Active Problems Location of Pain Severity and Description of Pain Patient Has Paino No Site Locations Pain Management and Medication Current Pain Management: Electronic Signature(s) Signed: 01/08/2022 4:20:15 PM By: Carlene Coria RN Entered By: Carlene Coria on 01/08/2022 15:36:14 Mailhot, Herminio Heads  ([id number]) -------------------------------------------------------------------------------- Patient/Caregiver Education Details Patient Name: SAJE, GALLOP. Date of Service: 01/08/2022 3:15 PM Medical Record Number: [id number] Patient Account Number: [id number] Date of Birth/Gender: 10/09/1985 (36 y.o. F) Treating RN: Carlene Coria Primary Care Physician: [name] Other Clinician: Referring Physician: [name] Treating Physician/Extender: Skipper Cliche in Treatment: 10 Education Assessment Education Provided To: Patient Education Topics Provided Wound/Skin Impairment: Methods: Explain/Verbal Responses: State content correctly Electronic Signature(s) Signed: 01/08/2022 4:20:15 PM By: Carlene Coria RN Entered By: Carlene Coria on 01/08/2022 16:05:59 [place], Herminio Heads ([id number]) -------------------------------------------------------------------------------- Wound Assessment Details Patient Name: TIMMIE, CALIX. Date of Service: 01/08/2022 3:15 PM Medical Record Number: [id number] Patient Account Number: [id number] Date of Birth/Sex: 14-Mar-1986 (36 y.o. F) Treating RN: Carlene Coria Primary Care Ramonte Mena: [name] Other Clinician: Referring Kutter Schnepf: [name] Treating Dary Dilauro/Extender: Skipper Cliche in Treatment: 10 Wound Status Wound Number: 4 Primary Etiology: Venous Leg Ulcer Wound Location: Left, Medial Lower Leg Wound Status: Open Wounding Event: Gradually Appeared Comorbid History: Hypertension,  Peripheral Venous Disease Date Acquired: 10/17/2021 Weeks Of Treatment: 10 Clustered Wound: No Photos Wound Measurements Length: (cm) 0.4 Width: (cm) 0.5 Depth: (cm) 0.2 Area: (cm) 0.157 Volume: (cm) 0.031 % Reduction in Area: 71.5% % Reduction in Volume: 43.6% Epithelialization: Medium (34-66%) Tunneling: No Undermining: No Wound Description Classification: Full Thickness Without Exposed Support Structures Exudate Amount:  Medium Exudate Type: Serosanguineous Exudate Color: red, brown Foul Odor After Cleansing: No Slough/Fibrino Yes Wound Bed Granulation Amount: Medium (34-66%) Exposed Structure Granulation Quality: Red Fascia Exposed: No Necrotic Amount: Medium (34-66%) Fat Layer (Subcutaneous Tissue) Exposed: Yes Necrotic Quality: Adherent Slough Tendon Exposed: No Muscle Exposed: No Joint Exposed: No Bone Exposed: No Treatment Notes Wound #4 (Lower Leg) Wound Laterality: Left, Medial Cleanser [place] Ancillary Kit - 15 Day Supply Discharge Instruction: Use supplies as instructed; Kit contains: (15) Saline Bullets; (15) 3x3 Gauze; 15 pr Gloves Soap and Water Discharge Instruction: Gently cleanse wound with antibacterial soap, rinse and pat dry prior to dressing wounds Florek, Tywanna J. ([id number]) Peri-Wound Care Topical Primary Dressing IODOFLEX 0.9% Cadexomer Iodine Pad Discharge Instruction: Apply Iodoflex to wound bed only as directed. Secondary Dressing (BORDER) Zetuvit Plus SILICONE BORDER Dressing 4x4 (in/in) Discharge Instruction: Please do not put silicone bordered dressings under wraps. Use non-bordered dressing only. Secured With Compression Wrap tubi grip Discharge Instruction: size E double layer Compression Stockings Add-Ons Electronic Signature(s) Signed: 01/08/2022 4:20:15 PM By: Carlene Coria RN Entered By: Carlene Coria on 01/08/2022 15:41:16 Henriquez, Herminio Heads ([id number]) -------------------------------------------------------------------------------- Vitals Details Patient Name: SEBRENA, ENGH Date of Service: 01/08/2022 3:15 PM Medical Record Number: [id number] Patient Account Number: [id number] Date of Birth/Sex: 08-05-1985 (36 y.o. F) Treating RN: Carlene Coria Primary Care Jlyn Cerros: [name] Other Clinician: Referring Scharlene Catalina: [name] Treating Shonn Farruggia/Extender: Skipper Cliche in Treatment: 10 Vital Signs Time Taken: 13:35 Temperature (F):  98.4 Height (in): 66 Pulse (bpm): 103 Weight (lbs): 400 Respiratory Rate (breaths/min): 18 Body Mass Index (BMI): 64.6 Blood Pressure (mmHg): 127/83 Reference Range: 80 - 120 mg / dl Electronic Signature(s) Signed: 01/08/2022 4:20:15 PM By: Carlene Coria RN Entered By: Carlene Coria on 01/08/2022 15:36:05

## 2022-01-08 NOTE — Progress Notes (Addendum)
[name] ([id number]) Visit Report for 01/08/2022 Chief Complaint Document Details Patient Name: [name], [name]. Date of Service: 01/08/2022 3:15 PM Medical Record Number: [id number] Patient Account Number: [id number] Date of Birth/Sex: [birth year] (36 y.o. F) Treating RN: Carlene Coria Primary Care Provider: [name] Other Clinician: Referring Provider: [name] Treating Provider/Extender: Skipper Cliche in Treatment: 10 Information Obtained from: Patient Chief Complaint Left LE Ulcer Electronic Signature(s) Signed: 01/08/2022 3:33:44 PM By: Worthy Keeler PA-C Entered By: Worthy Keeler on 01/08/2022 15:33:44 [name] ([id number]) -------------------------------------------------------------------------------- HPI Details Patient Name: [name], [name] Date of Service: 01/08/2022 3:15 PM Medical Record Number: [id number] Patient Account Number: [id number] Date of Birth/Sex: Jul 29, 1985 (36 y.o. F) Treating RN: Carlene Coria Primary Care Provider: [name] Other Clinician: Referring Provider: [name] Treating Provider/Extender: Skipper Cliche in Treatment: 10 History of Present Illness HPI Description: 36 year old patient who started with having ulcerations on the right lower leg on the lateral part of her ankle for about 2 weeks. She was seen in the ER at [facility] and advised to see the wound care for a consultation. No X-rays of workup was done during the ER visit and no prescription for any medications of compression wraps were given. the patient is not diabetic but does have hypertension and her medications have been reviewed by me. 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 The right and left greater saphenous vein was found to be tortuous. Deep venous system  was also not competent and there was reflux of more than 500 ms. She was then seen by Dr. Sherren Mocha Early who recommended that the patient would not benefit from endovenous ablation and he had recommended vein stripping on the right side and multiple small phlebectomy procedures on the left side. the patient did not follow-up due to social economic reasons. She has not been wearing any compression stockings and has not taken any specific treatment for varicose veins for the last 3 years. 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 We have obtained some appointments to see the vascular surgeons by the end of the month and the patient would like to follow up with me at my [facility] on Wednesday, June 29. 10/14/2014 -- she could not see me yesterday in [place] and hence has come for a review today. She has a vascular workup to be done this afternoon at [facility]. She is doing fine otherwise. 10/22/2014 -- she was seen by Dr. Curt Jews and he has recommended surgical removal of her right saphenous vein from distal thigh to saphenofemoral junction and stab phlebectomy's of multiple large tributary branches throughout her thigh and calf. This would be done under general anesthesia in the outpatient setting. 10/29/2014 -- she is trying to work on a surgical date and in the meanwhile we have got insurance clearance for Apligraf and we will start this next week. 7/22 2016 -- she is here for the first application of Apligraf. 11/19/2014 -- she is here for a second application of Apligraf 11/26/2014 -- she has done fine after her last application of Apligraf and is awaiting her surgery which is scheduled for August 31. 12/03/2014 -- she is doing fine and is here for her third application  of Apligraf. 12/21/2014 -- She had surgery on 12/15/2014 by Dr.Early who did #1 ligation and stripping of right  great saphenous vein from distal thigh to saphenofemoral junction, #2 stab phlebectomy of large tributary varicose veins in the thigh popliteal space and calf. She had an Ace wrap up to her groin and this was removed today and the Unna's boot was also removed. 12/28/2014 -- she is here for her fourth application of Apligraf. 01/06/2015 - he saw her vascular surgeon Dr. Donnetta Hutching who was pleased with her progress and he has confirmed that no surgical procedures could be attempted on the left side. 01/13/2015 -- her wound looks very good and she's been having no problems whatsoever. Readmission: 07/26/2020 upon evaluation today patient presents for initial inspection here in our clinic for a new issue with her left leg although she is previously been seen due to issues with the right leg back in 2016. At that time she was seeing Dr. Donnetta Hutching who is a vein/vascular specialist in [place]. He has since semiretired from what I understand. He is working out of [employer] I believe. Nonetheless she tells me at the time that there was really nothing to do for her left leg although the right leg was where they did most of the work. Subsequently she states that she is done fairly well until just in the past week where she had issues with bleeding from what appears to be varicose vein on the left leg medially. Unfortunately this has continued to be an issue although she tells me at first it was coming much more significantly Down quite a bit but nonetheless has not completely resolved. Every time she showers she notices that it starts to drain a little bit more. She does have a history of chronic venous insufficiency, lymphedema, varicose veins bilaterally, and obesity. 08/02/2020 upon evaluation today patient appears to be doing about the same in regards to the ulcer on her left leg. She has some eschar covering there is definitely some fluid collecting underneath unfortunately. With that being said she tells me she  is still having a tremendous amount of pain therefore she is really not able to allow me to clean this off very effectively to be perfectly honest. I think we need to try to soften this up 08/16/2020 upon evaluation today patient's wound is really not doing significantly better not really states about the same. She notes that the wrap just does not seem to be staying up very well at all unfortunately. No fevers, chills, nausea, vomiting, or diarrhea. She did cut it off once it starts to slide in order to alleviate some of the pressure from sliding Down. Fortunately there is no signs of active infection at this time which is great news. 08/23/2020 upon evaluation today patient appears to be doing well 08/23/2020 upon evaluation today patient appears to be doing well with regard to her wound all things considered. Fortunately there does not appear to be any signs of active infection at this time which is great news. She has been tolerating the dressing changes without complication and overall I am extremely pleased with where things stand at this point. She does have her appointment with vascular in [facility] on June 9. [name], [name] ([id number]) 08/30/2020 upon evaluation today patient actually appears to be doing decently well in regard to her wound. Fortunately there is no signs of active infection which is great news. Nonetheless I do believe that the patient is going require little bit of debridement if  she is okay with me attempting that today I think that will help clean off some of the necrotic tissue. Fortunately there does not appear to be otherwise any evidence of active infection which is also great news. 09/19/2020 upon evaluation today patient appears to be doing a little better in regard to her wound as compared to previous. Fortunately there does not appear to be any signs of active infection overall. No fever chills noted. I do believe that the Iodosorb has made this a little bit  better with regard to the overall size and appearance of the wound bed though again she does still have quite a ways to go to get this to heal she still very tender to touch. 09/27/2020 upon evaluation today patient appears to actually be doing quite well with regard to her wound. This is measuring much smaller which is great news. With that being said she did see vein and vascular in [facility] and they subsequently recommended that surgery is really what she probably needs to go forward with sounds like the potential for venous ablation. With that being said the patient tells me this is just not the right time for her to be able to proceed with any type of surgery which I completely understand. Nonetheless I do believe that she would continue to benefit from compression but again that is really not something that she is able to easily do. 10/04/2020 upon evaluation today patient appears to be doing about the same in regard to her wound. This is measuring a little bit smaller but nonetheless still is open and again has some slough and biofilm noted on the surface of the wound. There does not appear to be any signs of active infection which is great news. No fevers, chills, nausea, vomiting, or diarrhea. 10/04/2020 upon evaluation today patient appears to be doing well with regard to her wound. She has been tolerating dressing changes without complication. Fortunately there does not appear to be any signs of active infection which is great news. No fevers, chills, nausea, vomiting, or diarrhea. 10/25/2020 upon evaluation today patient appears to be doing well with regard to her wound. She has been tolerating the dressing changes without complication. Fortunately there is no signs of active infection at this time. No fevers, chills, nausea, vomiting, or diarrhea. 11/01/2020 upon evaluation today patient with regard to her wound. She has been tolerating the dressing changes without complication. Fortunately  there does not appear to be any signs of infection which is great news. No fever chills noted 11/15/2020 upon evaluation today patient appears to be doing well with regard to her wound. Fortunately there is no signs of active infection at this time. No fevers, chills, nausea, vomiting, or diarrhea. With that being said she continues to have a significant amount of pain at the site even though this is very close to complete closure. She also had several varicose veins around the area which were also problematic. Overall however I feel like the patient is making excellent progress. 11/28/2020 upon evaluation today patient appears to be doing well with regard to her leg ulcer. Again were not really able to debride or compression wrap her due to discomfort and pain. She does not allow for that. With that being said we have been using Iodosorb which does seem to be doing decently well. Fortunately there is no signs of active infection at this time which is great news. No fevers, chills, nausea, vomiting, or diarrhea. 8/31; patient presents for 2-week follow-up. She  has been using Iodosorb. She reports that the wound is closed. She denies signs of infection. Readmission: 10-27-2021 upon evaluation this is a patient that presents today whom I have previously seen this is pretty much for the same issue though I think a little bit higher than the last time I saw her. She does have a history of chronic venous insufficiency and hypertension along with varicose veins. Subsequently she does have an ulceration which spontaneously ruptured she has not been wearing any compression which I think is a big part of the issue here. We discussed this before I really think she probably needs to be wearing her compression therapy, she probably needs lymphedema pumps if she can ever wear the compression for a significant amount of time to get these, and subsequently also think that she needs to be elevating her legs is much as  possible she may even need some vascular intervention in regard to her veins. All of this was reiterated and discussed with her today to reinforce what needs to happen in order to ensure that her legs do not get a lot worse. The patient voiced understanding. She tells me that she knows because she is seeing her mom go through a lot of this as well how bad things can get. 11-03-2021 upon evaluation today patient appears to be doing well with regard to her wound. Fortunately there does not appear to be any signs of active infection at this time. She is measuring a little bit bigger but I think this is because the wound is actually cleaning up a bit here. 7/27; left lateral leg wound not any smaller but perhaps with a cleaner surface. She is using Iodoflex to help with the latter and using Tubigrip. She has chronic venous insufficiency with secondary lymphedema. She is apparently followed by vein and vascular and is being scheduled for an ablation 11-17-2021 upon evaluation today patient appears to be doing well with regard to her wound this is actually showing signs of improvement which is great news. Fortunately I do not see any evidence of active infection locally or systemically at this time which is great news. No fevers, chills, nausea, vomiting, or diarrhea. 11-24-2021 upon evaluation today patient's wound is actually showing signs of significant improvement. Unfortunately she had quite a bit of pain with debridement last week I do believe it was beneficial but at the same time she is doing much better but still really does not want this debrided again I think being that it is looking a whole lot better I would try to avoid that today especially since it caused her so much discomfort that is really not the goal and I explained that to the patient today she voiced understanding and knows that it needed to be done but still states that it was quite painful. 11-30-2021 upon evaluation today patient  appears to be doing excellent in regard to her wound this is actually showing signs of excellent improvement I am very pleased with where things stand. She does have her venous ablation appointment for October 17. 12-21-2021 upon evaluation today patient appears to be doing better in regard to her wound this is measuring smaller and looking better as well. Fortunately I do not see any signs of active infection locally or systemically which is great news. 01-01-2022 upon evaluation today patient appears to be doing well currently in regard to her wound. She is showing signs of improvement which is great news and overall I do not see any signs  of active infection locally or systemically at this time. 01-08-2022 upon evaluation today patient appears to be doing well currently in regard to her wound she is actually showing signs of significant improvement which is great news. Fortunately I do not see any evidence of active infection locally or systemically at this time. I do believe that we are on the right track. She also has her appointment October 19 for the venous ablation. [name], [name] ([id number]) Electronic Signature(s) Signed: 01/08/2022 4:09:37 PM By: Worthy Keeler PA-C Entered By: Worthy Keeler on 01/08/2022 16:09:37 Swearengin, [name] ([id number]) -------------------------------------------------------------------------------- Physical Exam Details Patient Name: [name], [name]. Date of Service: 01/08/2022 3:15 PM Medical Record Number: [id number] Patient Account Number: [id number] Date of Birth/Sex: December 12, 1985 (36 y.o. F) Treating RN: Carlene Coria Primary Care Provider: [name] Other Clinician: Referring Provider: [name] Treating Provider/Extender: Skipper Cliche in Treatment: 3 Constitutional Well-nourished and well-hydrated in no acute distress. Respiratory normal breathing without difficulty. Psychiatric this patient is able to make decisions and  demonstrates good insight into disease process. Alert and Oriented x 3. pleasant and cooperative. Notes Upon inspection patient's wound bed actually showed signs of good granulation and epithelization at this point I do believe the Iodosorb is working quite well. She seems to be making good progress. Electronic Signature(s) Signed: 01/08/2022 4:09:48 PM By: Worthy Keeler PA-C Entered By: Worthy Keeler on 01/08/2022 16:09:48 Kuennen, [name] ([id number]) -------------------------------------------------------------------------------- Physician Orders Details Patient Name: [name], [name]. Date of Service: 01/08/2022 3:15 PM Medical Record Number: [id number] Patient Account Number: [id number] Date of Birth/Sex: [birth year] (36 y.o. F) Treating RN: Carlene Coria Primary Care Provider: [name] Other Clinician: Referring Provider: [name] Treating Provider/Extender: Skipper Cliche in Treatment: 10 Verbal / Phone Orders: No Diagnosis Coding ICD-10 Coding Code Description I83.028 Varicose veins of left lower extremity with ulcer other part of lower leg I89.0 Lymphedema, not elsewhere classified L97.822 Non-pressure chronic ulcer of other part of left lower leg with fat layer exposed E66.01 Morbid (severe) obesity due to excess calories Follow-up Appointments o Return Appointment in 1 week. Bathing/ Shower/ Hygiene o May shower; gently cleanse wound with antibacterial soap, rinse and pat dry prior to dressing wounds Anesthetic (Use 'Patient Medications' Section for Anesthetic Order Entry) o Lidocaine applied to wound bed Edema Control - Lymphedema / Segmental Compressive Device / Other o Tubigrip double layer applied - size E left leg o Elevate, Exercise Daily and Avoid Standing for Long Periods of Time. o Elevate legs to the level of the heart and pump ankles as often as possible o Elevate leg(s) parallel to the floor when sitting. Wound  Treatment Wound #4 - Lower Leg Wound Laterality: Left, Medial Cleanser: Byram Ancillary Kit - 15 Day Supply (Generic) 3 x Per Week/30 Days Discharge Instructions: Use supplies as instructed; Kit contains: (15) Saline Bullets; (15) 3x3 Gauze; 15 pr Gloves Cleanser: Soap and Water 3 x Per Week/30 Days Discharge Instructions: Gently cleanse wound with antibacterial soap, rinse and pat dry prior to dressing wounds Primary Dressing: IODOFLEX 0.9% Cadexomer Iodine Pad (Generic) 3 x Per Week/30 Days Discharge Instructions: Apply Iodoflex to wound bed only as directed. Secondary Dressing: (BORDER) Zetuvit Plus SILICONE BORDER Dressing 4x4 (in/in) (Generic) 3 x Per Week/30 Days Discharge Instructions: Please do not put silicone bordered dressings under wraps. Use non-bordered dressing only. Compression Wrap: tubi grip 3 x Per Week/30 Days Discharge Instructions: size E double layer Electronic Signature(s) Signed: 01/08/2022 4:20:15 PM By: Carlene Coria RN  Signed: 01/08/2022 4:44:22 PM By: Worthy Keeler PA-C Entered By: Carlene Coria on 01/08/2022 16:04:49 Gawthrop, [name] ([id number]) -------------------------------------------------------------------------------- Problem List Details Patient Name: [name], [name]. Date of Service: 01/08/2022 3:15 PM Medical Record Number: [id number] Patient Account Number: [id number] Date of Birth/Sex: 1985-10-19 (36 y.o. F) Treating RN: Carlene Coria Primary Care Provider: [name] Other Clinician: Referring Provider: [name] Treating Provider/Extender: Skipper Cliche in Treatment: 10 Active Problems ICD-10 Encounter Code Description Active Date MDM Diagnosis I83.028 Varicose veins of left lower extremity with ulcer other part of lower leg 10/27/2021 No Yes I89.0 Lymphedema, not elsewhere classified 10/27/2021 No Yes L97.822 Non-pressure chronic ulcer of other part of left lower leg with fat layer 10/27/2021 No Yes exposed E66.01 Morbid  (severe) obesity due to excess calories 10/27/2021 No Yes Inactive Problems Resolved Problems Electronic Signature(s) Signed: 01/08/2022 3:33:40 PM By: Worthy Keeler PA-C Entered By: Worthy Keeler on 01/08/2022 15:33:39 Mcnease, [name] ([id number]) -------------------------------------------------------------------------------- Progress Note Details Patient Name: [name] Date of Service: 01/08/2022 3:15 PM Medical Record Number: [id number] Patient Account Number: [id number] Date of Birth/Sex: [birth year] (36 y.o. F) Treating RN: Carlene Coria Primary Care Provider: [name] Other Clinician: Referring Provider: [name] Treating Provider/Extender: Skipper Cliche in Treatment: 10 Subjective Chief Complaint Information obtained from Patient Left LE Ulcer History of Present Illness (HPI) 36 year old patient who started with having ulcerations on the right lower leg on the lateral part of her ankle for about 2 weeks. She was seen in the ER at [facility] and advised to see the wound care for a consultation. No X-rays of workup was done during the ER visit and no prescription for any medications of compression wraps were given. the patient is not diabetic but does have hypertension and her medications have been reviewed by me. 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 The right and left greater saphenous vein was found to be tortuous. Deep venous system was also not competent and there was reflux of more than 500 ms. She was then seen by Dr. Sherren Mocha Early who recommended that the patient would not benefit from endovenous ablation and he had recommended vein stripping on the right side and multiple small phlebectomy procedures on the left side. the patient did not follow-up due to social economic  reasons. She has not been wearing any compression stockings and has not taken any specific treatment for varicose veins for the last 3 years. 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 We have obtained some appointments to see the vascular surgeons by the end of the month and the patient would like to follow up with me at my [facility] on Wednesday, June 29. 10/14/2014 -- she could not see me yesterday in [place] and hence has come for a review today. She has a vascular workup to be done this afternoon at [facility]. She is doing fine otherwise. 10/22/2014 -- she was seen by Dr. Curt Jews and he has recommended surgical removal of her right saphenous vein from distal thigh to saphenofemoral junction and stab phlebectomy's of multiple large tributary branches throughout her thigh and calf. This would be done under general anesthesia in the outpatient setting. 10/29/2014 -- she is trying to  work on a surgical date and in the meanwhile we have got insurance clearance for Apligraf and we will start this next week. 7/22 2016 -- she is here for the first application of Apligraf. 11/19/2014 -- she is here for a second application of Apligraf 11/26/2014 -- she has done fine after her last application of Apligraf and is awaiting her surgery which is scheduled for August 31. 12/03/2014 -- she is doing fine and is here for her third application of Apligraf. 12/21/2014 -- She had surgery on 12/15/2014 by Dr.Early who did #1 ligation and stripping of right great saphenous vein from distal thigh to saphenofemoral junction, #2 stab phlebectomy of large tributary varicose veins in the thigh popliteal space and calf. She had an Ace wrap up to her groin and this was removed today and the Unna's boot was also removed. 12/28/2014 -- she is here for her fourth application of Apligraf. 01/06/2015 - he saw  her vascular surgeon Dr. Donnetta Hutching who was pleased with her progress and he has confirmed that no surgical procedures could be attempted on the left side. 01/13/2015 -- her wound looks very good and she's been having no problems whatsoever. Readmission: 07/26/2020 upon evaluation today patient presents for initial inspection here in our clinic for a new issue with her left leg although she is previously been seen due to issues with the right leg back in 2016. At that time she was seeing Dr. Donnetta Hutching who is a vein/vascular specialist in [place]. He has since semiretired from what I understand. He is working out of [employer] I believe. Nonetheless she tells me at the time that there was really nothing to do for her left leg although the right leg was where they did most of the work. Subsequently she states that she is done fairly well until just in the past week where she had issues with bleeding from what appears to be varicose vein on the left leg medially. Unfortunately this has continued to be an issue although she tells me at first it was coming much more significantly Down quite a bit but nonetheless has not completely resolved. Every time she showers she notices that it starts to drain a little bit more. She does have a history of chronic venous insufficiency, lymphedema, varicose veins bilaterally, and obesity. 08/02/2020 upon evaluation today patient appears to be doing about the same in regards to the ulcer on her left leg. She has some eschar covering there is definitely some fluid collecting underneath unfortunately. With that being said she tells me she is still having a tremendous amount of pain therefore she is really not able to allow me to clean this off very effectively to be perfectly honest. I think we need to try to soften this up 08/16/2020 upon evaluation today patient's wound is really not doing significantly better not really states about the same. She notes that the wrap just does  not seem to be staying up very well at all unfortunately. No fevers, chills, nausea, vomiting, or diarrhea. She did cut it off once it starts to slide in order to alleviate some of the pressure from sliding Down. Fortunately there is no signs of active infection at this time which is great news. [name], [name] ([id number]) 08/23/2020 upon evaluation today patient appears to be doing well 08/23/2020 upon evaluation today patient appears to be doing well with regard to her wound all things considered. Fortunately there does not appear to be any signs of active infection at this time  which is great news. She has been tolerating the dressing changes without complication and overall I am extremely pleased with where things stand at this point. She does have her appointment with vascular in [facility] on June 9. 08/30/2020 upon evaluation today patient actually appears to be doing decently well in regard to her wound. Fortunately there is no signs of active infection which is great news. Nonetheless I do believe that the patient is going require little bit of debridement if she is okay with me attempting that today I think that will help clean off some of the necrotic tissue. Fortunately there does not appear to be otherwise any evidence of active infection which is also great news. 09/19/2020 upon evaluation today patient appears to be doing a little better in regard to her wound as compared to previous. Fortunately there does not appear to be any signs of active infection overall. No fever chills noted. I do believe that the Iodosorb has made this a little bit better with regard to the overall size and appearance of the wound bed though again she does still have quite a ways to go to get this to heal she still very tender to touch. 09/27/2020 upon evaluation today patient appears to actually be doing quite well with regard to her wound. This is measuring much smaller which is great news. With that being  said she did see vein and vascular in [facility] and they subsequently recommended that surgery is really what she probably needs to go forward with sounds like the potential for venous ablation. With that being said the patient tells me this is just not the right time for her to be able to proceed with any type of surgery which I completely understand. Nonetheless I do believe that she would continue to benefit from compression but again that is really not something that she is able to easily do. 10/04/2020 upon evaluation today patient appears to be doing about the same in regard to her wound. This is measuring a little bit smaller but nonetheless still is open and again has some slough and biofilm noted on the surface of the wound. There does not appear to be any signs of active infection which is great news. No fevers, chills, nausea, vomiting, or diarrhea. 10/04/2020 upon evaluation today patient appears to be doing well with regard to her wound. She has been tolerating dressing changes without complication. Fortunately there does not appear to be any signs of active infection which is great news. No fevers, chills, nausea, vomiting, or diarrhea. 10/25/2020 upon evaluation today patient appears to be doing well with regard to her wound. She has been tolerating the dressing changes without complication. Fortunately there is no signs of active infection at this time. No fevers, chills, nausea, vomiting, or diarrhea. 11/01/2020 upon evaluation today patient with regard to her wound. She has been tolerating the dressing changes without complication. Fortunately there does not appear to be any signs of infection which is great news. No fever chills noted 11/15/2020 upon evaluation today patient appears to be doing well with regard to her wound. Fortunately there is no signs of active infection at this time. No fevers, chills, nausea, vomiting, or diarrhea. With that being said she continues to have a  significant amount of pain at the site even though this is very close to complete closure. She also had several varicose veins around the area which were also problematic. Overall however I feel like the patient is making excellent progress. 11/28/2020 upon  evaluation today patient appears to be doing well with regard to her leg ulcer. Again were not really able to debride or compression wrap her due to discomfort and pain. She does not allow for that. With that being said we have been using Iodosorb which does seem to be doing decently well. Fortunately there is no signs of active infection at this time which is great news. No fevers, chills, nausea, vomiting, or diarrhea. 8/31; patient presents for 2-week follow-up. She has been using Iodosorb. She reports that the wound is closed. She denies signs of infection. Readmission: 10-27-2021 upon evaluation this is a patient that presents today whom I have previously seen this is pretty much for the same issue though I think a little bit higher than the last time I saw her. She does have a history of chronic venous insufficiency and hypertension along with varicose veins. Subsequently she does have an ulceration which spontaneously ruptured she has not been wearing any compression which I think is a big part of the issue here. We discussed this before I really think she probably needs to be wearing her compression therapy, she probably needs lymphedema pumps if she can ever wear the compression for a significant amount of time to get these, and subsequently also think that she needs to be elevating her legs is much as possible she may even need some vascular intervention in regard to her veins. All of this was reiterated and discussed with her today to reinforce what needs to happen in order to ensure that her legs do not get a lot worse. The patient voiced understanding. She tells me that she knows because she is seeing her mom go through a lot of this as  well how bad things can get. 11-03-2021 upon evaluation today patient appears to be doing well with regard to her wound. Fortunately there does not appear to be any signs of active infection at this time. She is measuring a little bit bigger but I think this is because the wound is actually cleaning up a bit here. 7/27; left lateral leg wound not any smaller but perhaps with a cleaner surface. She is using Iodoflex to help with the latter and using Tubigrip. She has chronic venous insufficiency with secondary lymphedema. She is apparently followed by vein and vascular and is being scheduled for an ablation 11-17-2021 upon evaluation today patient appears to be doing well with regard to her wound this is actually showing signs of improvement which is great news. Fortunately I do not see any evidence of active infection locally or systemically at this time which is great news. No fevers, chills, nausea, vomiting, or diarrhea. 11-24-2021 upon evaluation today patient's wound is actually showing signs of significant improvement. Unfortunately she had quite a bit of pain with debridement last week I do believe it was beneficial but at the same time she is doing much better but still really does not want this debrided again I think being that it is looking a whole lot better I would try to avoid that today especially since it caused her so much discomfort that is really not the goal and I explained that to the patient today she voiced understanding and knows that it needed to be done but still states that it was quite painful. 11-30-2021 upon evaluation today patient appears to be doing excellent in regard to her wound this is actually showing signs of excellent improvement I am very pleased with where things stand. She does have her  venous ablation appointment for October 17. 12-21-2021 upon evaluation today patient appears to be doing better in regard to her wound this is measuring smaller and looking better as  well. Fortunately I do not see any signs of active infection locally or systemically which is great news. 01-01-2022 upon evaluation today patient appears to be doing well currently in regard to her wound. She is showing signs of improvement which is [name], [name]. ([id number]) great news and overall I do not see any signs of active infection locally or systemically at this time. 01-08-2022 upon evaluation today patient appears to be doing well currently in regard to her wound she is actually showing signs of significant improvement which is great news. Fortunately I do not see any evidence of active infection locally or systemically at this time. I do believe that we are on the right track. She also has her appointment October 19 for the venous ablation. Objective Constitutional Well-nourished and well-hydrated in no acute distress. Vitals Time Taken: 1:35 PM, Height: 66 in, Weight: 400 lbs, BMI: 64.6, Temperature: 98.4 F, Pulse: 103 bpm, Respiratory Rate: 18 breaths/min, Blood Pressure: 127/83 mmHg. Respiratory normal breathing without difficulty. Psychiatric this patient is able to make decisions and demonstrates good insight into disease process. Alert and Oriented x 3. pleasant and cooperative. General Notes: Upon inspection patient's wound bed actually showed signs of good granulation and epithelization at this point I do believe the Iodosorb is working quite well. She seems to be making good progress. Integumentary (Hair, Skin) Wound #4 status is Open. Original cause of wound was Gradually Appeared. The date acquired was: 10/17/2021. The wound has been in treatment 10 weeks. The wound is located on the Left,Medial Lower Leg. The wound measures 0.4cm length x 0.5cm width x 0.2cm depth; 0.157cm^2 area and 0.031cm^3 volume. There is Fat Layer (Subcutaneous Tissue) exposed. There is no tunneling or undermining noted. There is a medium amount of serosanguineous drainage noted. There is  medium (34-66%) red granulation within the wound bed. There is a medium (34-66%) amount of necrotic tissue within the wound bed including Adherent Slough. 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 Bathing/ Shower/ Hygiene: May shower; gently cleanse wound with antibacterial soap, rinse and pat dry prior to dressing wounds Anesthetic (Use 'Patient Medications' Section for Anesthetic Order Entry): Lidocaine applied to wound bed Edema Control - Lymphedema / Segmental Compressive Device / Other: Tubigrip double layer applied - size E left leg Elevate, Exercise Daily and Avoid Standing for Long Periods of Time. Elevate legs to the level of the heart and pump ankles as often as possible Elevate leg(s) parallel to the floor when sitting. WOUND #4: - Lower Leg Wound Laterality: Left, Medial Cleanser: Byram Ancillary Kit - 15 Day Supply (Generic) 3 x Per Week/30 Days Discharge Instructions: Use supplies as instructed; Kit contains: (15) Saline Bullets; (15) 3x3 Gauze; 15 pr Gloves Cleanser: Soap and Water 3 x Per Week/30 Days [name], [name] ([id number]) Discharge Instructions: Gently cleanse wound with antibacterial soap, rinse and pat dry prior to dressing wounds Primary Dressing: IODOFLEX 0.9% Cadexomer Iodine Pad (Generic) 3 x Per Week/30 Days Discharge Instructions: Apply Iodoflex to wound bed only as directed. Secondary Dressing: (BORDER) Zetuvit Plus SILICONE BORDER Dressing 4x4 (in/in) (Generic)  3 x Per Week/30 Days Discharge Instructions: Please do not put silicone bordered dressings under wraps. Use non-bordered dressing only. Compression Wrap: tubi grip 3 x Per Week/30 Days Discharge  Instructions: size E double layer 1. I am going to suggest that we go ahead and continue with the recommendation for wound care measures as before and she is in agreement with that plan. This includes the use of the Iodoflex which I think is doing good. 2. I am also can recommend that we continue with the bordered foam dressing to cover. 3. I am also going to suggest we continue with the Tubigrip double layer size E to help with edema control. We will see patient back for reevaluation in 1 week here in the clinic. If anything worsens or changes patient will contact our office for additional recommendations. Electronic Signature(s) Signed: 01/08/2022 4:10:25 PM By: Worthy Keeler PA-C Entered By: Worthy Keeler on 01/08/2022 16:10:25 Shoaf, [name] ([id number]) -------------------------------------------------------------------------------- SuperBill Details Patient Name: [name], [name] Date of Service: 01/08/2022 Medical Record Number: [id number] Patient Account Number: [id number] Date of Birth/Sex: [birth year] (36 y.o. F) Treating RN: Carlene Coria Primary Care Provider: [name] Other Clinician: Referring Provider: [name] Treating Provider/Extender: Skipper Cliche in Treatment: 10 Diagnosis Coding ICD-10 Codes Code Description I83.028 Varicose veins of left lower extremity with ulcer other part of lower leg I89.0 Lymphedema, not elsewhere classified L97.822 Non-pressure chronic ulcer of other part of left lower leg with fat layer exposed E66.01 Morbid (severe) obesity due to excess calories Facility Procedures CPT4 Code: 36067703 Description: [id number] - WOUND CARE VISIT-LEV 2 EST PT Modifier: Quantity: 1 Physician Procedures CPT4 Code: 4818590 Description: 93112 - WC PHYS LEVEL 3 - EST PT Modifier: Quantity: 1 CPT4 Code: Description: ICD-10 Diagnosis Description I83.028 Varicose veins of left lower extremity with ulcer other part of lower l I89.0  Lymphedema, not elsewhere classified L97.822 Non-pressure chronic ulcer of other part of left lower leg with fat lay E66.01  Morbid (severe) obesity due to excess calories Modifier: eg er exposed Quantity: Electronic Signature(s) Signed: 01/08/2022 4:11:05 PM By: Worthy Keeler PA-C Entered By: Worthy Keeler on 01/08/2022 16:11:05

## 2022-01-09 ENCOUNTER — Ambulatory Visit: Payer: BC Managed Care – PPO

## 2022-01-09 NOTE — Progress Notes (Deleted)
  SUBJECTIVE:   CHIEF COMPLAINT / HPI:   Pain in upper back  PERTINENT  PMH / PSH: ***  Past Medical History:  Diagnosis Date   Ankle ulcer ([place])    right   Childhood asthma    Chronic lower back pain    GERD (gastroesophageal reflux disease)    Headache    "only when I'm hungry" (10/14/2015)   Hypertension    Obesity    Varicose veins    VSD (ventricular septal defect)    "had hole in bottom of her heart; closed up on it's own; no OR" /mom 10/14/2015)    Past Surgical History:  Procedure Laterality Date   HERNIA REPAIR     INSERTION OF MESH N/A 10/14/2015   Procedure: INSERTION OF MESH;  Surgeon: Jackolyn Confer, MD;  Location: [place];  Service: General;  Laterality: N/A;   LAPAROSCOPIC INCISIONAL / UMBILICAL / [place]  10/14/2015   VHR w/mesh   TONSILLECTOMY AND ADENOIDECTOMY  1990s   TYMPANOSTOMY TUBE PLACEMENT Bilateral 1990s   VEIN LIGATION AND STRIPPING Right 12/15/2014   Procedure: SAPHENOUS VEIN LIGATION AND STRIPPING; GREATER THAN 20 STAB PHLEBECTOMIES;  Surgeon: Rosetta Posner, MD;  Location: [place];  Service: Vascular;  Laterality: Right;   VENTRAL HERNIA REPAIR N/A 10/14/2015   Procedure: LAPAROSCOPIC VENTRAL HERNIA;  Surgeon: Jackolyn Confer, MD;  Location: [place];  Service: General;  Laterality: N/A;    OBJECTIVE:  There were no vitals taken for this visit.  General: NAD, pleasant, able to participate in exam Cardiac: RRR, no murmurs auscultated. Respiratory: CTAB, normal effort, no wheezes, rales or rhonchi Abdomen: soft, non-tender, non-distended, normoactive bowel sounds Extremities: warm and well perfused, no edema or cyanosis. Skin: warm and dry, no rashes noted Neuro: alert, no obvious focal deficits, speech normal Psych: Normal affect and mood  ASSESSMENT/PLAN:  No problem-specific Assessment & Plan notes found for this encounter.   No orders of the defined types were placed in this encounter.  No orders of the defined types were placed  in this encounter.  No follow-ups on file. @SIGNNOTE @ {    This will disappear when note is signed, click to select method of visit    :1}

## 2022-01-15 ENCOUNTER — Ambulatory Visit: Payer: BC Managed Care – PPO | Admitting: Physician Assistant

## 2022-01-16 ENCOUNTER — Encounter: Payer: BC Managed Care – PPO | Attending: Physician Assistant | Admitting: Physician Assistant

## 2022-01-16 DIAGNOSIS — I890 Lymphedema, not elsewhere classified: Secondary | ICD-10-CM | POA: Diagnosis not present

## 2022-01-16 DIAGNOSIS — L97822 Non-pressure chronic ulcer of other part of left lower leg with fat layer exposed: Secondary | ICD-10-CM | POA: Diagnosis present

## 2022-01-16 DIAGNOSIS — I872 Venous insufficiency (chronic) (peripheral): Secondary | ICD-10-CM | POA: Insufficient documentation

## 2022-01-16 DIAGNOSIS — I10 Essential (primary) hypertension: Secondary | ICD-10-CM | POA: Diagnosis not present

## 2022-01-16 NOTE — Progress Notes (Signed)
DEVANNY, PALECEK ([id number]) Visit Report for 01/16/2022 Chief Complaint Document Details Patient Name: [name], [name]. Date of Service: 01/16/2022 3:30 PM Medical Record Number: [id number] Patient Account Number: [id number] Date of Birth/Sex: December 13, 1985 (36 y.o. F) Treating RN: Cornell Barman Primary Care Provider: [name] Other Clinician: [name] Referring Provider: [name] Treating Provider/Extender: Skipper Cliche in Treatment: 11 Information Obtained from: Patient Chief Complaint Left LE Ulcer Electronic Signature(s) Signed: 01/16/2022 3:44:27 PM By: Worthy Keeler PA-C Entered By: Worthy Keeler on 01/16/2022 15:44:27 Riera, Herminio Heads ([id number]) -------------------------------------------------------------------------------- HPI Details Patient Name: [name], [name] Date of Service: 01/16/2022 3:30 PM Medical Record Number: [id number] Patient Account Number: [id number] Date of Birth/Sex: 1986/02/11 (36 y.o. F) Treating RN: Cornell Barman Primary Care Provider: [name] Other Clinician: [name] Referring Provider: [name] Treating Provider/Extender: Skipper Cliche in Treatment: 11 History of Present Illness HPI Description: 36 year old patient who started with having ulcerations on the right lower leg on the lateral part of her ankle for about 2 weeks. She was seen in the ER at [facility] and advised to see the wound care for a consultation. No X-rays of workup was done during the ER visit and no prescription for any medications of compression wraps were given. the patient is not diabetic but does have hypertension and her medications have been reviewed by me. 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 The right and left greater saphenous vein was found to be  tortuous. Deep venous system was also not competent and there was reflux of more than 500 ms. She was then seen by Dr. Sherren Mocha Early who recommended that the patient would not benefit from endovenous ablation and he had recommended vein stripping on the right side and multiple small phlebectomy procedures on the left side. the patient did not follow-up due to social economic reasons. She has not been wearing any compression stockings and has not taken any specific treatment for varicose veins for the last 3 years. 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 We have obtained some appointments to see the vascular surgeons by the end of the month and the patient would like to follow up with me at my [facility] on Wednesday, June 29. 10/14/2014 -- she could not see me yesterday in [place] and hence has come for a review today. She has a vascular workup to be done this afternoon at [facility]. She is doing fine otherwise. 10/22/2014 -- she was seen by Dr. Curt Jews and he has recommended surgical removal of her right saphenous vein from distal thigh to saphenofemoral junction and stab phlebectomy's of multiple large tributary branches throughout her thigh and calf. This would be done under general anesthesia in the outpatient setting. 10/29/2014 -- she is trying to work on a surgical date and in the meanwhile we have got insurance clearance for Apligraf and we will start this next week. 7/22 2016 -- she is here for the first application of Apligraf. 11/19/2014 -- she is here for a second application of Apligraf 11/26/2014 -- she has done fine after her last application of Apligraf and is awaiting her surgery which is scheduled for August 31. 12/03/2014 -- she is doing fine and is here  for her third application of Apligraf. 12/21/2014 -- She had surgery on 12/15/2014 by Dr.Early who did #1  ligation and stripping of right great saphenous vein from distal thigh to saphenofemoral junction, #2 stab phlebectomy of large tributary varicose veins in the thigh popliteal space and calf. She had an Ace wrap up to her groin and this was removed today and the Unna's boot was also removed. 12/28/2014 -- she is here for her fourth application of Apligraf. 01/06/2015 - he saw her vascular surgeon Dr. Donnetta Hutching who was pleased with her progress and he has confirmed that no surgical procedures could be attempted on the left side. 01/13/2015 -- her wound looks very good and she's been having no problems whatsoever. Readmission: 07/26/2020 upon evaluation today patient presents for initial inspection here in our clinic for a new issue with her left leg although she is previously been seen due to issues with the right leg back in 2016. At that time she was seeing Dr. Donnetta Hutching who is a vein/vascular specialist in [place]. He has since semiretired from what I understand. He is working out of [employer] I believe. Nonetheless she tells me at the time that there was really nothing to do for her left leg although the right leg was where they did most of the work. Subsequently she states that she is done fairly well until just in the past week where she had issues with bleeding from what appears to be varicose vein on the left leg medially. Unfortunately this has continued to be an issue although she tells me at first it was coming much more significantly Down quite a bit but nonetheless has not completely resolved. Every time she showers she notices that it starts to drain a little bit more. She does have a history of chronic venous insufficiency, lymphedema, varicose veins bilaterally, and obesity. 08/02/2020 upon evaluation today patient appears to be doing about the same in regards to the ulcer on her left leg. She has some eschar covering there is definitely some fluid collecting underneath unfortunately. With  that being said she tells me she is still having a tremendous amount of pain therefore she is really not able to allow me to clean this off very effectively to be perfectly honest. I think we need to try to soften this up 08/16/2020 upon evaluation today patient's wound is really not doing significantly better not really states about the same. She notes that the wrap just does not seem to be staying up very well at all unfortunately. No fevers, chills, nausea, vomiting, or diarrhea. She did cut it off once it starts to slide in order to alleviate some of the pressure from sliding Down. Fortunately there is no signs of active infection at this time which is great news. 08/23/2020 upon evaluation today patient appears to be doing well 08/23/2020 upon evaluation today patient appears to be doing well with regard to her wound all things considered. Fortunately there does not appear to be any signs of active infection at this time which is great news. She has been tolerating the dressing changes without complication and overall I am extremely pleased with where things stand at this point. She does have her appointment with vascular in [facility] on June 9. NYAJA, DUBUQUE ([id number]) 08/30/2020 upon evaluation today patient actually appears to be doing decently well in regard to her wound. Fortunately there is no signs of active infection which is great news. Nonetheless I do believe that the patient is going require little  bit of debridement if she is okay with me attempting that today I think that will help clean off some of the necrotic tissue. Fortunately there does not appear to be otherwise any evidence of active infection which is also great news. 09/19/2020 upon evaluation today patient appears to be doing a little better in regard to her wound as compared to previous. Fortunately there does not appear to be any signs of active infection overall. No fever chills noted. I do believe that the Iodosorb  has made this a little bit better with regard to the overall size and appearance of the wound bed though again she does still have quite a ways to go to get this to heal she still very tender to touch. 09/27/2020 upon evaluation today patient appears to actually be doing quite well with regard to her wound. This is measuring much smaller which is great news. With that being said she did see vein and vascular in [facility] and they subsequently recommended that surgery is really what she probably needs to go forward with sounds like the potential for venous ablation. With that being said the patient tells me this is just not the right time for her to be able to proceed with any type of surgery which I completely understand. Nonetheless I do believe that she would continue to benefit from compression but again that is really not something that she is able to easily do. 10/04/2020 upon evaluation today patient appears to be doing about the same in regard to her wound. This is measuring a little bit smaller but nonetheless still is open and again has some slough and biofilm noted on the surface of the wound. There does not appear to be any signs of active infection which is great news. No fevers, chills, nausea, vomiting, or diarrhea. 10/04/2020 upon evaluation today patient appears to be doing well with regard to her wound. She has been tolerating dressing changes without complication. Fortunately there does not appear to be any signs of active infection which is great news. No fevers, chills, nausea, vomiting, or diarrhea. 10/25/2020 upon evaluation today patient appears to be doing well with regard to her wound. She has been tolerating the dressing changes without complication. Fortunately there is no signs of active infection at this time. No fevers, chills, nausea, vomiting, or diarrhea. 11/01/2020 upon evaluation today patient with regard to her wound. She has been tolerating the dressing changes without  complication. Fortunately there does not appear to be any signs of infection which is great news. No fever chills noted 11/15/2020 upon evaluation today patient appears to be doing well with regard to her wound. Fortunately there is no signs of active infection at this time. No fevers, chills, nausea, vomiting, or diarrhea. With that being said she continues to have a significant amount of pain at the site even though this is very close to complete closure. She also had several varicose veins around the area which were also problematic. Overall however I feel like the patient is making excellent progress. 11/28/2020 upon evaluation today patient appears to be doing well with regard to her leg ulcer. Again were not really able to debride or compression wrap her due to discomfort and pain. She does not allow for that. With that being said we have been using Iodosorb which does seem to be doing decently well. Fortunately there is no signs of active infection at this time which is great news. No fevers, chills, nausea, vomiting, or diarrhea. 8/31; patient presents  for 2-week follow-up. She has been using Iodosorb. She reports that the wound is closed. She denies signs of infection. Readmission: 10-27-2021 upon evaluation this is a patient that presents today whom I have previously seen this is pretty much for the same issue though I think a little bit higher than the last time I saw her. She does have a history of chronic venous insufficiency and hypertension along with varicose veins. Subsequently she does have an ulceration which spontaneously ruptured she has not been wearing any compression which I think is a big part of the issue here. We discussed this before I really think she probably needs to be wearing her compression therapy, she probably needs lymphedema pumps if she can ever wear the compression for a significant amount of time to get these, and subsequently also think that she needs to be  elevating her legs is much as possible she may even need some vascular intervention in regard to her veins. All of this was reiterated and discussed with her today to reinforce what needs to happen in order to ensure that her legs do not get a lot worse. The patient voiced understanding. She tells me that she knows because she is seeing her mom go through a lot of this as well how bad things can get. 11-03-2021 upon evaluation today patient appears to be doing well with regard to her wound. Fortunately there does not appear to be any signs of active infection at this time. She is measuring a little bit bigger but I think this is because the wound is actually cleaning up a bit here. 7/27; left lateral leg wound not any smaller but perhaps with a cleaner surface. She is using Iodoflex to help with the latter and using Tubigrip. She has chronic venous insufficiency with secondary lymphedema. She is apparently followed by vein and vascular and is being scheduled for an ablation 11-17-2021 upon evaluation today patient appears to be doing well with regard to her wound this is actually showing signs of improvement which is great news. Fortunately I do not see any evidence of active infection locally or systemically at this time which is great news. No fevers, chills, nausea, vomiting, or diarrhea. 11-24-2021 upon evaluation today patient's wound is actually showing signs of significant improvement. Unfortunately she had quite a bit of pain with debridement last week I do believe it was beneficial but at the same time she is doing much better but still really does not want this debrided again I think being that it is looking a whole lot better I would try to avoid that today especially since it caused her so much discomfort that is really not the goal and I explained that to the patient today she voiced understanding and knows that it needed to be done but still states that it was quite painful. 11-30-2021 upon  evaluation today patient appears to be doing excellent in regard to her wound this is actually showing signs of excellent improvement I am very pleased with where things stand. She does have her venous ablation appointment for October 17. 12-21-2021 upon evaluation today patient appears to be doing better in regard to her wound this is measuring smaller and looking better as well. Fortunately I do not see any signs of active infection locally or systemically which is great news. 01-01-2022 upon evaluation today patient appears to be doing well currently in regard to her wound. She is showing signs of improvement which is great news and overall I do  not see any signs of active infection locally or systemically at this time. 01-08-2022 upon evaluation today patient appears to be doing well currently in regard to her wound she is actually showing signs of significant improvement which is great news. Fortunately I do not see any evidence of active infection locally or systemically at this time. I do believe that we are on the right track. She also has her appointment October 19 for the venous ablation. [name], [name] ([id number]) 01-16-2022 upon evaluation today patient's wound actually is showing some signs of improvement although this is very slow. Fortunately I do not see any evidence of infection at this time. The volume is a little bit more although the size is smaller I think this is due to the fact that we are slowly cleaning this area out effectively. Electronic Signature(s) Signed: 01/16/2022 4:29:54 PM By: Worthy Keeler PA-C Entered By: Worthy Keeler on 01/16/2022 16:29:54 Dimmitt, Herminio Heads ([id number]) -------------------------------------------------------------------------------- Physical Exam Details Patient Name: [name], [name]. Date of Service: 01/16/2022 3:30 PM Medical Record Number: [id number] Patient Account Number: [id number] Date of Birth/Sex: November 04, 1985 (36 y.o. F) Treating  RN: Cornell Barman Primary Care Provider: [name] Other Clinician: [name] Referring Provider: [name] Treating Provider/Extender: Skipper Cliche in Treatment: 58 Constitutional Well-nourished and well-hydrated in no acute distress. Respiratory normal breathing without difficulty. Psychiatric this patient is able to make decisions and demonstrates good insight into disease process. Alert and Oriented x 3. pleasant and cooperative. Notes Patient's wound bed showed signs of good granulation and epithelization at this point. Fortunately I do not see any evidence of active infection at this time which is great news and overall I am extremely pleased with where we stand today. Electronic Signature(s) Signed: 01/16/2022 4:30:09 PM By: Worthy Keeler PA-C Entered By: Worthy Keeler on 01/16/2022 16:30:09 Tippets, Herminio Heads ([id number]) -------------------------------------------------------------------------------- Physician Orders Details Patient Name: [name], [name] Date of Service: 01/16/2022 3:30 PM Medical Record Number: [id number] Patient Account Number: [id number] Date of Birth/Sex: 10/16/1985 (36 y.o. F) Treating RN: Cornell Barman Primary Care Provider: [name] Other Clinician: [name] Referring Provider: [name] Treating Provider/Extender: Skipper Cliche in Treatment: 11 Verbal / Phone Orders: No Diagnosis Coding ICD-10 Coding Code Description I83.028 Varicose veins of left lower extremity with ulcer other part of lower leg I89.0 Lymphedema, not elsewhere classified L97.822 Non-pressure chronic ulcer of other part of left lower leg with fat layer exposed E66.01 Morbid (severe) obesity due to excess calories Follow-up Appointments o Return Appointment in 1 week. Bathing/ Shower/ Hygiene o May shower; gently cleanse wound with antibacterial soap, rinse and pat dry prior to dressing wounds Anesthetic (Use 'Patient  Medications' Section for Anesthetic Order Entry) o Lidocaine applied to wound bed Edema Control - Lymphedema / Segmental Compressive Device / Other o Tubigrip double layer applied - size E left leg o Elevate, Exercise Daily and Avoid Standing for Long Periods of Time. o Elevate legs to the level of the heart and pump ankles as often as possible o Elevate leg(s) parallel to the floor when sitting. Wound Treatment Wound #4 - Lower Leg Wound Laterality: Left, Medial Cleanser: Byram Ancillary Kit - 15 Day Supply (Generic) 3 x Per Week/30 Days Discharge Instructions: Use supplies as instructed; Kit contains: (15) Saline Bullets; (15) 3x3 Gauze; 15 pr Gloves Cleanser: Soap and Water 3 x Per Week/30 Days Discharge Instructions: Gently cleanse wound with antibacterial soap, rinse and pat dry prior to dressing wounds Primary Dressing: IODOFLEX  0.9% Cadexomer Iodine Pad (Generic) 3 x Per Week/30 Days Discharge Instructions: Apply Iodoflex to wound bed only as directed. Secondary Dressing: (BORDER) Zetuvit Plus SILICONE BORDER Dressing 4x4 (in/in) (Generic) 3 x Per Week/30 Days Discharge Instructions: Please do not put silicone bordered dressings under wraps. Use non-bordered dressing only. Compression Wrap: tubi grip 3 x Per Week/30 Days Discharge Instructions: size E double layer Electronic Signature(s) Signed: 01/16/2022 5:15:02 PM By: [name] Signed: 01/18/2022 2:50:44 PM By: Worthy Keeler PA-C Entered By: [name] on 01/16/2022 16:27:53 Proctor, Herminio Heads ([id number]) -------------------------------------------------------------------------------- Problem List Details Patient Name: LISA-MARIE, RUEGER. Date of Service: 01/16/2022 3:30 PM Medical Record Number: [id number] Patient Account Number: [id number] Date of Birth/Sex: December 15, 1985 (36 y.o. F) Treating RN: Cornell Barman Primary Care Provider: [name] Other Clinician: [name] Referring Provider: [name] Treating Provider/Extender: Skipper Cliche in Treatment: 11 Active Problems ICD-10 Encounter Code Description Active Date MDM Diagnosis I83.028 Varicose veins of left lower extremity with ulcer other part of lower leg 10/27/2021 No Yes I89.0 Lymphedema, not elsewhere classified 10/27/2021 No Yes L97.822 Non-pressure chronic ulcer of other part of left lower leg with fat layer 10/27/2021 No Yes exposed E66.01 Morbid (severe) obesity due to excess calories 10/27/2021 No Yes Inactive Problems Resolved Problems Electronic Signature(s) Signed: 01/16/2022 3:44:25 PM By: Worthy Keeler PA-C Entered By: Worthy Keeler on 01/16/2022 15:44:24 [name], Herminio Heads ([id number]) -------------------------------------------------------------------------------- Progress Note Details Patient Name: [name] [name] Date of Service: 01/16/2022 3:30 PM Medical Record Number: [id number] Patient Account Number: [id number] Date of Birth/Sex: Nov 04, 1985 (36 y.o. F) Treating RN: Cornell Barman Primary Care Provider: [name] Other Clinician: [name] Referring Provider: [name] Treating Provider/Extender: Skipper Cliche in Treatment: 11 Subjective Chief Complaint Information obtained from Patient Left LE Ulcer History of Present Illness (HPI) 36 year old patient who started with having ulcerations on the right lower leg on the lateral part of her ankle for about 2 weeks. She was seen in the ER at [facility] and advised to see the wound care for a consultation. No X-rays of workup was done during the ER visit and no prescription for any medications of compression wraps were given. the patient is not diabetic but does have hypertension and her medications have been reviewed by me. 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 The right and left greater saphenous vein was found to be tortuous. Deep venous system was also not competent and there was reflux of more than 500 ms. She was then seen by Dr. Sherren Mocha Early who recommended that the patient would not benefit from endovenous ablation and he had recommended vein stripping on the right side and multiple small phlebectomy procedures on the left side. the patient did not follow-up due to social economic reasons. She has not been wearing any compression stockings and has not taken any specific treatment for varicose veins for the last 3 years. 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 We have obtained some appointments to see the vascular surgeons by the end of the month and the patient would like to follow up with me at my [facility] on Wednesday, June 29. 10/14/2014 -- she could not see me yesterday in [place] and hence  has come for a review today. She has a vascular workup to be done this afternoon at [facility]. She is doing fine otherwise. 10/22/2014 -- she was seen by Dr. Curt Jews and he has recommended surgical removal of her right saphenous vein from distal thigh to saphenofemoral junction and stab phlebectomy's of multiple large tributary branches throughout her thigh and calf. This would be done under general anesthesia in the outpatient setting. 10/29/2014 -- she is trying to work on a surgical date and in the meanwhile we have got insurance clearance for Apligraf and we will start this next week. 7/22 2016 -- she is here for the first application of Apligraf. 11/19/2014 -- she is here for a second application of Apligraf 11/26/2014 -- she has done fine after her last application of Apligraf and is awaiting her surgery which is scheduled for August 31. 12/03/2014 -- she is doing fine and is here for her third application of  Apligraf. 12/21/2014 -- She had surgery on 12/15/2014 by Dr.Early who did #1 ligation and stripping of right great saphenous vein from distal thigh to saphenofemoral junction, #2 stab phlebectomy of large tributary varicose veins in the thigh popliteal space and calf. She had an Ace wrap up to her groin and this was removed today and the Unna's boot was also removed. 12/28/2014 -- she is here for her fourth application of Apligraf. 01/06/2015 - he saw her vascular surgeon Dr. Donnetta Hutching who was pleased with her progress and he has confirmed that no surgical procedures could be attempted on the left side. 01/13/2015 -- her wound looks very good and she's been having no problems whatsoever. Readmission: 07/26/2020 upon evaluation today patient presents for initial inspection here in our clinic for a new issue with her left leg although she is previously been seen due to issues with the right leg back in 2016. At that time she was seeing Dr. Donnetta Hutching who is a vein/vascular specialist in [place]. He has since semiretired from what I understand. He is working out of [employer] I believe. Nonetheless she tells me at the time that there was really nothing to do for her left leg although the right leg was where they did most of the work. Subsequently she states that she is done fairly well until just in the past week where she had issues with bleeding from what appears to be varicose vein on the left leg medially. Unfortunately this has continued to be an issue although she tells me at first it was coming much more significantly Down quite a bit but nonetheless has not completely resolved. Every time she showers she notices that it starts to drain a little bit more. She does have a history of chronic venous insufficiency, lymphedema, varicose veins bilaterally, and obesity. 08/02/2020 upon evaluation today patient appears to be doing about the same in regards to the ulcer on her left leg. She has some  eschar covering there is definitely some fluid collecting underneath unfortunately. With that being said she tells me she is still having a tremendous amount of pain therefore she is really not able to allow me to clean this off very effectively to be perfectly honest. I think we need to try to soften this up 08/16/2020 upon evaluation today patient's wound is really not doing significantly better not really states about the same. She notes that the wrap just does not seem to be staying up very well at all unfortunately. No fevers, chills, nausea, vomiting, or diarrhea. She did cut it  off once it starts to slide in order to alleviate some of the pressure from sliding Down. Fortunately there is no signs of active infection at this time which is great news. VALLARIE, [name] ([id number]) 08/23/2020 upon evaluation today patient appears to be doing well 08/23/2020 upon evaluation today patient appears to be doing well with regard to her wound all things considered. Fortunately there does not appear to be any signs of active infection at this time which is great news. She has been tolerating the dressing changes without complication and overall I am extremely pleased with where things stand at this point. She does have her appointment with vascular in [facility] on June 9. 08/30/2020 upon evaluation today patient actually appears to be doing decently well in regard to her wound. Fortunately there is no signs of active infection which is great news. Nonetheless I do believe that the patient is going require little bit of debridement if she is okay with me attempting that today I think that will help clean off some of the necrotic tissue. Fortunately there does not appear to be otherwise any evidence of active infection which is also great news. 09/19/2020 upon evaluation today patient appears to be doing a little better in regard to her wound as compared to previous. Fortunately there does not appear to be any  signs of active infection overall. No fever chills noted. I do believe that the Iodosorb has made this a little bit better with regard to the overall size and appearance of the wound bed though again she does still have quite a ways to go to get this to heal she still very tender to touch. 09/27/2020 upon evaluation today patient appears to actually be doing quite well with regard to her wound. This is measuring much smaller which is great news. With that being said she did see vein and vascular in [facility] and they subsequently recommended that surgery is really what she probably needs to go forward with sounds like the potential for venous ablation. With that being said the patient tells me this is just not the right time for her to be able to proceed with any type of surgery which I completely understand. Nonetheless I do believe that she would continue to benefit from compression but again that is really not something that she is able to easily do. 10/04/2020 upon evaluation today patient appears to be doing about the same in regard to her wound. This is measuring a little bit smaller but nonetheless still is open and again has some slough and biofilm noted on the surface of the wound. There does not appear to be any signs of active infection which is great news. No fevers, chills, nausea, vomiting, or diarrhea. 10/04/2020 upon evaluation today patient appears to be doing well with regard to her wound. She has been tolerating dressing changes without complication. Fortunately there does not appear to be any signs of active infection which is great news. No fevers, chills, nausea, vomiting, or diarrhea. 10/25/2020 upon evaluation today patient appears to be doing well with regard to her wound. She has been tolerating the dressing changes without complication. Fortunately there is no signs of active infection at this time. No fevers, chills, nausea, vomiting, or diarrhea. 11/01/2020 upon evaluation  today patient with regard to her wound. She has been tolerating the dressing changes without complication. Fortunately there does not appear to be any signs of infection which is great news. No fever chills noted 11/15/2020 upon evaluation today patient  appears to be doing well with regard to her wound. Fortunately there is no signs of active infection at this time. No fevers, chills, nausea, vomiting, or diarrhea. With that being said she continues to have a significant amount of pain at the site even though this is very close to complete closure. She also had several varicose veins around the area which were also problematic. Overall however I feel like the patient is making excellent progress. 11/28/2020 upon evaluation today patient appears to be doing well with regard to her leg ulcer. Again were not really able to debride or compression wrap her due to discomfort and pain. She does not allow for that. With that being said we have been using Iodosorb which does seem to be doing decently well. Fortunately there is no signs of active infection at this time which is great news. No fevers, chills, nausea, vomiting, or diarrhea. 8/31; patient presents for 2-week follow-up. She has been using Iodosorb. She reports that the wound is closed. She denies signs of infection. Readmission: 10-27-2021 upon evaluation this is a patient that presents today whom I have previously seen this is pretty much for the same issue though I think a little bit higher than the last time I saw her. She does have a history of chronic venous insufficiency and hypertension along with varicose veins. Subsequently she does have an ulceration which spontaneously ruptured she has not been wearing any compression which I think is a big part of the issue here. We discussed this before I really think she probably needs to be wearing her compression therapy, she probably needs lymphedema pumps if she can ever wear the compression for a  significant amount of time to get these, and subsequently also think that she needs to be elevating her legs is much as possible she may even need some vascular intervention in regard to her veins. All of this was reiterated and discussed with her today to reinforce what needs to happen in order to ensure that her legs do not get a lot worse. The patient voiced understanding. She tells me that she knows because she is seeing her mom go through a lot of this as well how bad things can get. 11-03-2021 upon evaluation today patient appears to be doing well with regard to her wound. Fortunately there does not appear to be any signs of active infection at this time. She is measuring a little bit bigger but I think this is because the wound is actually cleaning up a bit here. 7/27; left lateral leg wound not any smaller but perhaps with a cleaner surface. She is using Iodoflex to help with the latter and using Tubigrip. She has chronic venous insufficiency with secondary lymphedema. She is apparently followed by vein and vascular and is being scheduled for an ablation 11-17-2021 upon evaluation today patient appears to be doing well with regard to her wound this is actually showing signs of improvement which is great news. Fortunately I do not see any evidence of active infection locally or systemically at this time which is great news. No fevers, chills, nausea, vomiting, or diarrhea. 11-24-2021 upon evaluation today patient's wound is actually showing signs of significant improvement. Unfortunately she had quite a bit of pain with debridement last week I do believe it was beneficial but at the same time she is doing much better but still really does not want this debrided again I think being that it is looking a whole lot better I would try to  avoid that today especially since it caused her so much discomfort that is really not the goal and I explained that to the patient today she voiced understanding and  knows that it needed to be done but still states that it was quite painful. 11-30-2021 upon evaluation today patient appears to be doing excellent in regard to her wound this is actually showing signs of excellent improvement I am very pleased with where things stand. She does have her venous ablation appointment for October 17. 12-21-2021 upon evaluation today patient appears to be doing better in regard to her wound this is measuring smaller and looking better as well. Fortunately I do not see any signs of active infection locally or systemically which is great news. 01-01-2022 upon evaluation today patient appears to be doing well currently in regard to her wound. She is showing signs of improvement which is TED, [name]. ([id number]) great news and overall I do not see any signs of active infection locally or systemically at this time. 01-08-2022 upon evaluation today patient appears to be doing well currently in regard to her wound she is actually showing signs of significant improvement which is great news. Fortunately I do not see any evidence of active infection locally or systemically at this time. I do believe that we are on the right track. She also has her appointment October 19 for the venous ablation. 01-16-2022 upon evaluation today patient's wound actually is showing some signs of improvement although this is very slow. Fortunately I do not see any evidence of infection at this time. The volume is a little bit more although the size is smaller I think this is due to the fact that we are slowly cleaning this area out effectively. Objective Constitutional Well-nourished and well-hydrated in no acute distress. Vitals Time Taken: 4:14 PM, Height: 66 in, Weight: 400 lbs, BMI: 64.6, Temperature: 98.6 F, Pulse: 96 bpm, Respiratory Rate: 16 breaths/min, Blood Pressure: 126/84 mmHg. Respiratory normal breathing without difficulty. Psychiatric this patient is able to make decisions and  demonstrates good insight into disease process. Alert and Oriented x 3. pleasant and cooperative. General Notes: Patient's wound bed showed signs of good granulation and epithelization at this point. Fortunately I do not see any evidence of active infection at this time which is great news and overall I am extremely pleased with where we stand today. Integumentary (Hair, Skin) Wound #4 status is Open. Original cause of wound was Gradually Appeared. The date acquired was: 10/17/2021. The wound has been in treatment 11 weeks. The wound is located on the Left,Medial Lower Leg. The wound measures 0.4cm length x 0.4cm width x 0.4cm depth; 0.126cm^2 area and 0.05cm^3 volume. There is Fat Layer (Subcutaneous Tissue) exposed. There is a medium amount of serosanguineous drainage noted. There is medium (34-66%) red granulation within the wound bed. There is a medium (34-66%) amount of necrotic tissue within the wound bed including Adherent Slough. 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 Bathing/ Shower/ Hygiene: May shower; gently cleanse wound with antibacterial soap, rinse and pat dry prior to dressing wounds Anesthetic (Use 'Patient Medications' Section for Anesthetic Order Entry): Lidocaine applied to wound bed Edema Control - Lymphedema / Segmental Compressive Device / Other: Tubigrip double layer applied - size  E left leg Elevate, Exercise Daily and Avoid Standing for Long Periods of Time. Elevate legs to the level of the heart and pump ankles as often as possible Elevate leg(s) parallel to the floor when sitting. OMEGA, [name] ([id number]) WOUND #4: - Lower Leg Wound Laterality: Left,  Medial Cleanser: Byram Ancillary Kit - 15 Day Supply (Generic) 3 x Per Week/30 Days Discharge Instructions: Use supplies as instructed; Kit contains: (15) Saline Bullets; (15) 3x3 Gauze; 15 pr Gloves Cleanser: Soap and Water 3 x Per Week/30 Days Discharge Instructions: Gently cleanse wound with antibacterial soap, rinse and pat dry prior to dressing wounds Primary Dressing: IODOFLEX 0.9% Cadexomer Iodine Pad (Generic) 3 x Per Week/30 Days Discharge Instructions: Apply Iodoflex to wound bed only as directed. Secondary Dressing: (BORDER) Zetuvit Plus SILICONE BORDER Dressing 4x4 (in/in) (Generic) 3 x Per Week/30 Days Discharge Instructions: Please do not put silicone bordered dressings under wraps. Use non-bordered dressing only. Compression Wrap: tubi grip 3 x Per Week/30 Days Discharge Instructions: size E double layer 1. I am going to continue with the recommendation for wound care measures as before and the patient is in agreement with plan. This includes the use of the Iodoflex which we can continue with currently. 2. I am also going to recommend that we have the patient continue with the Zetuvit bordered foam dressing to cover. 3. I am also going to suggest that she should continue with the Tubigrip to help with edema control. 4. The patient does have her appointment on the 19th for the venous reflux ablation. We will see patient back for reevaluation in 1 week here in the clinic. If anything worsens or changes patient will contact our office for additional recommendations. Electronic Signature(s) Signed: 01/16/2022 4:31:00 PM By: Worthy Keeler PA-C Entered By: Worthy Keeler on 01/16/2022 16:31:00 Teicher, Herminio Heads ([id number]) -------------------------------------------------------------------------------- SuperBill Details Patient Name: KATTI, [name] Date of Service: 01/16/2022 Medical Record Number: [id number] Patient Account Number: [id number] Date of Birth/Sex: Nov 26, 1985 (36  y.o. F) Treating RN: Cornell Barman Primary Care Provider: [name] Other Clinician: [name] Referring Provider: [name] Treating Provider/Extender: Skipper Cliche in Treatment: 11 Diagnosis Coding ICD-10 Codes Code Description I83.028 Varicose veins of left lower extremity with ulcer other part of lower leg I89.0 Lymphedema, not elsewhere classified L97.822 Non-pressure chronic ulcer of other part of left lower leg with fat layer exposed E66.01 Morbid (severe) obesity due to excess calories Facility Procedures CPT4 Code: 22449753 Description: 99213 - WOUND CARE VISIT-LEV 3 EST PT Modifier: Quantity: 1 Physician Procedures CPT4 Code: 0051102 Description: 11173 - WC PHYS LEVEL 3 - EST PT Modifier: Quantity: 1 CPT4 Code: Description: ICD-10 Diagnosis Description I83.028 Varicose veins of left lower extremity with ulcer other part of lower l I89.0 Lymphedema, not elsewhere classified L97.822 Non-pressure chronic ulcer of other part of left lower leg with fat lay E66.01  Morbid (severe) obesity due to excess calories Modifier: eg er exposed Quantity: Electronic Signature(s) Signed: 01/16/2022 4:31:11 PM By: Worthy Keeler PA-C Entered By: Worthy Keeler on 01/16/2022 16:31:11

## 2022-01-16 NOTE — Progress Notes (Signed)
[name], [name] ([id number]) Visit Report for 01/16/2022 Arrival Information Details Patient Name: [name], [name]. Date of Service: 01/16/2022 3:30 PM Medical Record Number: [id number] Patient Account Number: [id number] Date of Birth/Sex: [birth year] (36 y.o. F) Treating RN: Cornell Barman Primary Care Wanda Rideout: [name] Other Clinician: [name] Referring Selita Staiger: [name] Treating Erie Radu/Extender: Skipper Cliche in Treatment: 11 Visit Information History Since Last Visit All ordered tests and consults were completed: No Patient Arrived: Ambulatory Added or deleted any medications: Yes Arrival Time: 16:13 Any new allergies or adverse reactions: No Transfer Assistance: None Had a fall or experienced change in No Patient Requires Transmission-Based Precautions: No activities of daily living that may affect Patient Has Alerts: No risk of falls: Hospitalized since last visit: No Pain Present Now: Yes Electronic Signature(s) Signed: 01/16/2022 5:15:02 PM By: [name] Entered By: [name] on 01/16/2022 16:13:19 Shimmin, Herminio Heads ([id number]) -------------------------------------------------------------------------------- Clinic Level of Care Assessment Details Patient Name: [name], [name] Date of Service: 01/16/2022 3:30 PM Medical Record Number: [id number] Patient Account Number: [id number] Date of Birth/Sex: 01-01-1986 (36 y.o. F) Treating RN: Cornell Barman Primary Care Tysheka Fanguy: [name] Other Clinician: [name] Referring Teya Otterson: [name] Treating Chance Munter/Extender: Skipper Cliche in Treatment: 11 Clinic Level of Care Assessment Items TOOL 4 Quantity Score []  - Use when only an EandM is performed on FOLLOW-UP visit 0 ASSESSMENTS - Nursing Assessment / Reassessment X - Reassessment of Co-morbidities (includes updates in patient status) 1 10 X- 1 5 Reassessment of Adherence to Treatment Plan ASSESSMENTS - Wound and  Skin Assessment / Reassessment X - Simple Wound Assessment / Reassessment - one wound 1 5 []  - 0 Complex Wound Assessment / Reassessment - multiple wounds []  - 0 Dermatologic / Skin Assessment (not related to wound area) ASSESSMENTS - Focused Assessment []  - Circumferential Edema Measurements - multi extremities 0 []  - 0 Nutritional Assessment / Counseling / Intervention []  - 0 Lower Extremity Assessment (monofilament, tuning fork, pulses) []  - 0 Peripheral Arterial Disease Assessment (using hand held doppler) ASSESSMENTS - Ostomy and/or Continence Assessment and Care []  - Incontinence Assessment and Management 0 []  - 0 Ostomy Care Assessment and Management (repouching, etc.) PROCESS - Coordination of Care X - Simple Patient / Family Education for ongoing care 1 15 []  - 0 Complex (extensive) Patient / Family Education for ongoing care []  - 0 Staff obtains Programmer, systems, Records, Test Results / Process Orders []  - 0 Staff telephones HHA, Nursing Homes / Clarify orders / etc []  - 0 Routine Transfer to another Facility (non-emergent condition) []  - 0 Routine Hospital Admission (non-emergent condition) []  - 0 New Admissions / Biomedical engineer / Ordering NPWT, Apligraf, etc. []  - 0 Emergency Hospital Admission (emergent condition) X- 1 10 Simple Discharge Coordination []  - 0 Complex (extensive) Discharge Coordination PROCESS - Special Needs []  - Pediatric / Minor Patient Management 0 []  - 0 Isolation Patient Management []  - 0 Hearing / Language / Visual special needs []  - 0 Assessment of Community assistance (transportation, D/C planning, etc.) []  - 0 Additional assistance / Altered mentation []  - 0 Support Surface(s) Assessment (bed, cushion, seat, etc.) INTERVENTIONS - Wound Cleansing / Measurement Schaber, Hudson J. ([id number]) X- 1 5 Simple Wound Cleansing - one wound []  - 0 Complex Wound Cleansing - multiple wounds X- 1 5 Wound Imaging (photographs - any  number of wounds) []  - 0 Wound Tracing (instead of photographs) X- 1 5 Simple Wound Measurement - one wound []  - 0 Complex Wound Measurement -  multiple wounds INTERVENTIONS - Wound Dressings []  - Small Wound Dressing one or multiple wounds 0 X- 1 15 Medium Wound Dressing one or multiple wounds []  - 0 Large Wound Dressing one or multiple wounds []  - 0 Application of Medications - topical []  - 0 Application of Medications - injection INTERVENTIONS - Miscellaneous []  - External ear exam 0 []  - 0 Specimen Collection (cultures, biopsies, blood, body fluids, etc.) []  - 0 Specimen(s) / Culture(s) sent or taken to Lab for analysis []  - 0 Patient Transfer (multiple staff / Civil Service fast streamer / Similar devices) []  - 0 Simple Staple / Suture removal (25 or less) []  - 0 Complex Staple / Suture removal (26 or more) []  - 0 Hypo / Hyperglycemic Management (close monitor of Blood Glucose) []  - 0 Ankle / Brachial Index (ABI) - do not check if billed separately X- 1 5 Vital Signs Has the patient been seen at the hospital within the last three years: Yes Total Score: 80 Level Of Care: New/Established - Level 3 Electronic Signature(s) Signed: 01/16/2022 5:15:02 PM By: [name] Entered By: [name] on 01/16/2022 16:28:27 Garlick, Herminio Heads ([id number]) -------------------------------------------------------------------------------- Encounter Discharge Information Details Patient Name: [name], [name]. Date of Service: 01/16/2022 3:30 PM Medical Record Number: [id number] Patient Account Number: [id number] Date of Birth/Sex: 04-Feb-1986 (36 y.o. F) Treating RN: Cornell Barman Primary Care Ciria Bernardini: [name] Other Clinician: [name] Referring Jaidin Richison: [name] Treating Shemekia Patane/Extender: Skipper Cliche in Treatment: 11 Encounter Discharge Information Items Discharge Condition: Stable Ambulatory Status: Ambulatory Discharge Destination: Home Transportation:  Private Auto Accompanied By: self Schedule Follow-up Appointment: Yes Clinical Summary of Care: Electronic Signature(s) Signed: 01/16/2022 5:15:02 PM By: [name] Entered By: [name] on 01/16/2022 16:41:30 Hosmer, Herminio Heads ([id number]) -------------------------------------------------------------------------------- Lower Extremity Assessment Details Patient Name: [name], [name] Date of Service: 01/16/2022 3:30 PM Medical Record Number: [id number] Patient Account Number: [id number] Date of Birth/Sex: [birth year] (36 y.o. F) Treating RN: Cornell Barman Primary Care Alphonso Gregson: [name] Other Clinician: [name] Referring Breeann Reposa: [name] Treating Iris Tatsch/Extender: Jeri Cos Weeks in Treatment: 11 Edema Assessment Assessed: Shirlyn Goltz: Yes] Patrice Paradise: No] Edema: [Left: Ye] [Right: s] Calf Left: Right: Point of Measurement: 31 cm From Medial Instep 60 cm Ankle Left: Right: Point of Measurement: 10 cm From Medial Instep 34 cm Vascular Assessment Pulses: Dorsalis Pedis Palpable: [Left:Yes] Posterior Tibial Palpable: [Left:Yes] Electronic Signature(s) Signed: 01/16/2022 5:15:02 PM By: [name] Signed: 01/17/2022 9:40:21 AM By: Gretta Cool, [name], RN, CWS, [name], [name] Entered By: [name] on 01/16/2022 16:24:02 Tusing, Herminio Heads ([id number]) -------------------------------------------------------------------------------- Multi Wound Chart Details Patient Name: [name], [name]. Date of Service: 01/16/2022 3:30 PM Medical Record Number: [id number] Patient Account Number: [id number] Date of Birth/Sex: [birth year] (36 y.o. F) Treating RN: Cornell Barman Primary Care Jaryiah Mehlman: [name] Other Clinician: [name] Referring Abubakr Wieman: [name] Treating Johanna Matto/Extender: Skipper Cliche in Treatment: 11 Vital Signs Height(in): 66 Pulse(bpm): 56 Weight(lbs): 400 Blood Pressure(mmHg): 126/84 Body Mass Index(BMI): 64.6 Temperature(F):  98.6 Respiratory Rate(breaths/min): 16 Photos: [N/A:N/A] Wound Location: Left, Medial Lower Leg N/A N/A Wounding Event: Gradually Appeared N/A N/A Primary Etiology: Venous Leg Ulcer N/A N/A Comorbid History: Hypertension, Peripheral Venous N/A N/A Disease Date Acquired: 10/17/2021 N/A N/A Weeks of Treatment: 11 N/A N/A Wound Status: Open N/A N/A Wound Recurrence: No N/A N/A Measurements L x W x D (cm) 0.4x0.4x0.4 N/A N/A Area (cm) : 0.126 N/A N/A Volume (cm) : 0.05 N/A N/A % Reduction in Area: 77.10% N/A N/A % Reduction in Volume: 9.10%  N/A N/A Classification: Full Thickness Without Exposed N/A N/A Support Structures Exudate Amount: Medium N/A N/A Exudate Type: Serosanguineous N/A N/A Exudate Color: red, brown N/A N/A Granulation Amount: Medium (34-66%) N/A N/A Granulation Quality: Red N/A N/A Necrotic Amount: Medium (34-66%) N/A N/A Exposed Structures: Fat Layer (Subcutaneous Tissue): N/A N/A Yes Fascia: No Tendon: No Muscle: No Joint: No Bone: No Epithelialization: Medium (34-66%) N/A N/A Treatment Notes Electronic Signature(s) Signed: 01/16/2022 5:15:02 PM By: [name] Entered By: [name] on 01/16/2022 16:24:18 Humble, Herminio Heads ([id number]) -------------------------------------------------------------------------------- [place] Details Patient Name: [name], [name] Date of Service: 01/16/2022 3:30 PM Medical Record Number: [id number] Patient Account Number: [id number] Date of Birth/Sex: Jul 23, 1985 (36 y.o. F) Treating RN: Cornell Barman Primary Care Amin Fornwalt: [name] Other Clinician: [name] Referring Frankey Botting: [name] Treating Deziree Mokry/Extender: Skipper Cliche in Treatment: 11 Active Inactive Wound/Skin Impairment Nursing Diagnoses: Knowledge deficit related to ulceration/compromised skin integrity Goals: Patient/caregiver will verbalize understanding of skin care regimen Date Initiated:  10/27/2021 Target Resolution Date: 11/27/2021 Goal Status: Active Ulcer/skin breakdown will have a volume reduction of 30% by week 4 Date Initiated: 10/27/2021 Target Resolution Date: 12/28/2021 Goal Status: Active Ulcer/skin breakdown will have a volume reduction of 50% by week 8 Date Initiated: 10/27/2021 Target Resolution Date: 01/27/2022 Goal Status: Active Ulcer/skin breakdown will have a volume reduction of 80% by week 12 Date Initiated: 10/27/2021 Target Resolution Date: 02/27/2022 Goal Status: Active Ulcer/skin breakdown will heal within 14 weeks Date Initiated: 10/27/2021 Target Resolution Date: 03/29/2022 Goal Status: Active Interventions: Assess patient/caregiver ability to obtain necessary supplies Assess patient/caregiver ability to perform ulcer/skin care regimen upon admission and as needed Assess ulceration(s) every visit Notes: Electronic Signature(s) Signed: 01/16/2022 5:15:02 PM By: [name] Signed: 01/17/2022 9:40:21 AM By: Gretta Cool, [name], RN, CWS, [name], [name] Entered By: [name] on 01/16/2022 [place], Herminio Heads ([id number]) -------------------------------------------------------------------------------- Pain Assessment Details Patient Name: [name], [name]. Date of Service: 01/16/2022 3:30 PM Medical Record Number: [id number] Patient Account Number: [id number] Date of Birth/Sex: 12/25/1985 (36 y.o. F) Treating RN: Cornell Barman Primary Care Kalley Nicholl: [name] Other Clinician: [name] Referring Danell Vazquez: [name] Treating Keyshawna Prouse/Extender: Skipper Cliche in Treatment: 11 Active Problems Location of Pain Severity and Description of Pain Patient Has Paino Yes Site Locations Pain Location: Pain in Ulcers Duration of the Pain. Constant / Intermittento Intermittent Rate the pain. Current Pain Level: 4 Character of Pain Describe the Pain: Stabbing, Throbbing Pain Management and Medication Current Pain  Management: Medication: Yes Rest: Yes Electronic Signature(s) Signed: 01/16/2022 5:15:02 PM By: [name] Signed: 01/17/2022 9:40:21 AM By: Gretta Cool, [name], RN, CWS, [name], [name] Entered By: [name] on 01/16/2022 16:16:11 Manley, Herminio Heads ([id number]) -------------------------------------------------------------------------------- Patient/Caregiver Education Details Patient Name: [name], [name]. Date of Service: 01/16/2022 3:30 PM Medical Record Number: [id number] Patient Account Number: [id number] Date of Birth/Gender: [birth year] (36 y.o. F) Treating RN: Cornell Barman Primary Care Physician: [name] Other Clinician: [name] Referring Physician: [name] Treating Physician/Extender: Skipper Cliche in Treatment: 11 Education Assessment Education Provided To: Patient Education Topics Provided Wound/Skin Impairment: Handouts: Other: continue wound care as directed Methods: Explain/Verbal Responses: State content correctly Electronic Signature(s) Signed: 01/16/2022 5:15:02 PM By: [name] Entered By: [name] on 01/16/2022 16:28:57 Kennan, Herminio Heads ([id number]) -------------------------------------------------------------------------------- Wound Assessment Details Patient Name: [name], [name]. Date of Service: 01/16/2022 3:30 PM Medical Record Number: [id number] Patient Account Number: [id number] Date of Birth/Sex: 03/03/1986 (36 y.o. F) Treating RN: Cornell Barman Primary Care Mirtie Bastyr: Ruben Im,  Legrand Como Other Clinician: [name] Referring Allyssia Skluzacek: [name] Treating Nikholas Geffre/Extender: Skipper Cliche in Treatment: 11 Wound Status Wound Number: 4 Primary Etiology: Venous Leg Ulcer Wound Location: Left, Medial Lower Leg Wound Status: Open Wounding Event: Gradually Appeared Comorbid History: Hypertension, Peripheral Venous Disease Date Acquired: 10/17/2021 Weeks Of Treatment: 11 Clustered Wound: No Photos Wound  Measurements Length: (cm) 0.4 Width: (cm) 0.4 Depth: (cm) 0.4 Area: (cm) 0.126 Volume: (cm) 0.05 % Reduction in Area: 77.1% % Reduction in Volume: 9.1% Epithelialization: Medium (34-66%) Wound Description Classification: Full Thickness Without Exposed Support Structures Exudate Amount: Medium Exudate Type: Serosanguineous Exudate Color: red, brown Foul Odor After Cleansing: No Slough/Fibrino Yes Wound Bed Granulation Amount: Medium (34-66%) Exposed Structure Granulation Quality: Red Fascia Exposed: No Necrotic Amount: Medium (34-66%) Fat Layer (Subcutaneous Tissue) Exposed: Yes Necrotic Quality: Adherent Slough Tendon Exposed: No Muscle Exposed: No Joint Exposed: No Bone Exposed: No Treatment Notes Wound #4 (Lower Leg) Wound Laterality: Left, Medial Cleanser Byram Ancillary Kit - 15 Day Supply Discharge Instruction: Use supplies as instructed; Kit contains: (15) Saline Bullets; (15) 3x3 Gauze; 15 pr Gloves Soap and Water Discharge Instruction: Gently cleanse wound with antibacterial soap, rinse and pat dry prior to dressing wounds Huhn, Kileigh J. ([id number]) Peri-Wound Care Topical Primary Dressing IODOFLEX 0.9% Cadexomer Iodine Pad Discharge Instruction: Apply Iodoflex to wound bed only as directed. Secondary Dressing (BORDER) Zetuvit Plus SILICONE BORDER Dressing 4x4 (in/in) Discharge Instruction: Please do not put silicone bordered dressings under wraps. Use non-bordered dressing only. Secured With Compression Wrap tubi grip Discharge Instruction: size E double layer Compression Stockings Add-Ons Electronic Signature(s) Signed: 01/16/2022 5:15:02 PM By: [name] Signed: 01/17/2022 9:40:21 AM By: Gretta Cool, [name], RN, CWS, [name], [name] Entered By: [name] on 01/16/2022 16:23:02 [place], Herminio Heads ([id number]) -------------------------------------------------------------------------------- Vitals Details Patient Name: [name], [name]. Date of Service:  01/16/2022 3:30 PM Medical Record Number: [id number] Patient Account Number: [id number] Date of Birth/Sex: 1985-06-06 (36 y.o. F) Treating RN: Cornell Barman Primary Care Herald Vallin: [name] Other Clinician: [name] Referring Linkin Vizzini: [name] Treating Rosanne Wohlfarth/Extender: Skipper Cliche in Treatment: 11 Vital Signs Time Taken: 16:14 Temperature (F): 98.6 Height (in): 66 Pulse (bpm): 96 Weight (lbs): 400 Respiratory Rate (breaths/min): 16 Body Mass Index (BMI): 64.6 Blood Pressure (mmHg): 126/84 Reference Range: 80 - 120 mg / dl Electronic Signature(s) Signed: 01/16/2022 5:15:02 PM By: [name] Entered By: [name] on 01/16/2022 16:15:51

## 2022-01-23 ENCOUNTER — Ambulatory Visit: Payer: BC Managed Care – PPO | Admitting: Physician Assistant

## 2022-01-30 ENCOUNTER — Telehealth: Payer: Self-pay

## 2022-01-30 NOTE — Telephone Encounter (Signed)
Received a call from patient requesting to postpone her ligation and stripping procedure to later in the year due finances. Procedure rescheduled for 03/29/22. Instructions reviewed- patient verbalized understanding.

## 2022-02-09 ENCOUNTER — Ambulatory Visit: Payer: BC Managed Care – PPO | Admitting: Physician Assistant

## 2022-02-15 ENCOUNTER — Ambulatory Visit: Payer: BC Managed Care – PPO | Admitting: Physician Assistant

## 2022-02-19 ENCOUNTER — Encounter: Payer: BC Managed Care – PPO | Attending: Internal Medicine | Admitting: Internal Medicine

## 2022-02-19 DIAGNOSIS — I872 Venous insufficiency (chronic) (peripheral): Secondary | ICD-10-CM | POA: Insufficient documentation

## 2022-02-19 DIAGNOSIS — I83028 Varicose veins of left lower extremity with ulcer other part of lower leg: Secondary | ICD-10-CM | POA: Insufficient documentation

## 2022-02-19 DIAGNOSIS — I890 Lymphedema, not elsewhere classified: Secondary | ICD-10-CM | POA: Insufficient documentation

## 2022-02-19 DIAGNOSIS — I10 Essential (primary) hypertension: Secondary | ICD-10-CM | POA: Diagnosis not present

## 2022-02-19 DIAGNOSIS — L97822 Non-pressure chronic ulcer of other part of left lower leg with fat layer exposed: Secondary | ICD-10-CM | POA: Diagnosis not present

## 2022-02-19 DIAGNOSIS — Z6841 Body Mass Index (BMI) 40.0 and over, adult: Secondary | ICD-10-CM | POA: Insufficient documentation

## 2022-02-21 NOTE — Progress Notes (Signed)
[name], BARTL ([id number]) [id number] Page 1 of 9 Visit Report for 02/19/2022 Arrival Information Details Patient Name: Date of Service: [name], [name] [place] J. 02/19/2022 3:00 PM Medical Record Number: [id number] Patient Account Number: [id number] Date of Birth/Sex: Treating RN: May 27, 1985 (35 y.o. Charolette Forward, Kim Primary Care Nain Rudd: [name] Other Clinician: [name] Referring Haylee Mcanany: Treating Mercedies Ganesh/Extender: Eldridge Dace, MICHA EL Cammy Brochure in Treatment: 16 Visit Information History Since Last Visit All ordered tests and consults were completed: No Patient Arrived: Ambulatory Added or deleted any medications: No Arrival Time: 15:19 Any new allergies or adverse reactions: No Transfer Assistance: None Had a fall or experienced change in No Patient Requires Transmission-Based Precautions: No activities of daily living that may affect Patient Has Alerts: No risk of falls: Hospitalized since last visit: No Pain Present Now: No Electronic Signature(s) Signed: 02/20/2022 5:15:48 PM By: [name] Entered By: [name] on 02/19/2022 15:26:05 -------------------------------------------------------------------------------- Clinic Level of Care Assessment Details Patient Name: Date of Service: [name], [name] [place] J. 02/19/2022 3:00 PM Medical Record Number: [id number] Patient Account Number: [id number] Date of Birth/Sex: Treating RN: Dec 25, 1985 (36 y.o. [name] Primary Care Taneal Sonntag: [name] Other Clinician: [name] Referring Deiontae Rabel: Treating Amed Datta/Extender: Eldridge Dace, MICHA EL Cammy Brochure in Treatment: 16 Clinic Level of Care Assessment Items TOOL 4 Quantity Score _0  - 0 Use when only an EandM is performed on FOLLOW-UP visit ASSESSMENTS - Nursing Assessment / Reassessment X- 1 10 Reassessment of Co-morbidities (includes updates in patient status) X- 1 5 Reassessment of Adherence to  Treatment Plan ASSESSMENTS - Wound and Skin A ssessment / Reassessment X - Simple Wound Assessment / Reassessment - one wound 1 5 _1  - 0 Complex Wound Assessment / Reassessment - multiple wounds [name], [name] ([id number]) [id number] Page 2 of 9 _2  - 0 Dermatologic / Skin Assessment (not related to wound area) ASSESSMENTS - Focused Assessment _3  - 0 Circumferential Edema Measurements - multi extremities _4  - 0 Nutritional Assessment / Counseling / Intervention _5  - 0 Lower Extremity Assessment (monofilament, tuning fork, pulses) _6  - 0 Peripheral Arterial Disease Assessment (using hand held doppler) ASSESSMENTS - Ostomy and/or Continence Assessment and Care _7  - 0 Incontinence Assessment and Management _8  - 0 Ostomy Care Assessment and Management (repouching, etc.) PROCESS - Coordination of Care X - Simple Patient / Family Education for ongoing care 1 15 _9  - 0 Complex (extensive) Patient / Family Education for ongoing care _10  - 0 Staff obtains Programmer, systems, Records, T Results / Process Orders est _11  - 0 Staff telephones HHA, Nursing Homes / Clarify orders / etc _12  - 0 Routine Transfer to another Facility (non-emergent condition) _13  - 0 Routine Hospital Admission (non-emergent condition) _14  - 0 New Admissions / Biomedical engineer / Ordering NPWT Apligraf, etc. , _15  - 0 Emergency Hospital Admission (emergent condition) X- 1 10 Simple Discharge Coordination _16  - 0 Complex (extensive) Discharge Coordination PROCESS - Special Needs _17  - 0 Pediatric / Minor Patient Management _18  - 0 Isolation Patient Management _19  - 0 Hearing / Language / Visual special needs _20  - 0 Assessment of Community assistance (transportation, D/C planning, etc.) _21  - 0 Additional assistance / Altered mentation _22  - 0 Support Surface(s) Assessment (bed, cushion, seat, etc.) INTERVENTIONS - Wound Cleansing / Measurement X - Simple Wound Cleansing - one wound 1  5 _23  - 0 Complex Wound Cleansing - multiple wounds X- 1 5 Wound Imaging (photographs - any number of wounds) _24  - 0 Wound  Tracing (instead of photographs) X- 1 5 Simple Wound Measurement - one wound _0  - 0 Complex Wound Measurement - multiple wounds INTERVENTIONS - Wound Dressings _1  - 0 Small Wound Dressing one or multiple wounds X- 1 15 Medium Wound Dressing one or multiple wounds _2  - 0 Large Wound Dressing one or multiple wounds X- 1 5 Application of Medications - topical <KVQQVZDGLOVFIEPP>_2<\/RJJOACZYSAYTKZSW>_1  - 0 Application of Medications - injection INTERVENTIONS - Miscellaneous _4  - 0 External ear exam _5  - 0 Specimen Collection (cultures, biopsies, blood, body fluids, etc.) _6  - 0 Specimen(s) / Culture(s) sent or taken to Lab for analysis KATRICIA, PREHN ([id number]) [id number] Page 3 of 9 _7  - 0 Patient Transfer (multiple staff / Civil Service fast streamer / Similar devices) _8  - 0 Simple Staple / Suture removal (25 or less) _9  - 0 Complex Staple / Suture removal (26 or more) _10  - 0 Hypo / Hyperglycemic Management (close monitor of Blood Glucose) _11  - 0 Ankle / Brachial Index (ABI) - do not check if billed separately X- 1 5 Vital Signs Has the patient been seen at the hospital within the last three years: Yes Total Score: 85 Level Of Care: New/Established - Level 3 Electronic Signature(s) Signed: 02/20/2022 5:15:48 PM By: [name] Entered By: [name] on 02/19/2022 15:50:39 -------------------------------------------------------------------------------- Encounter Discharge Information Details Patient Name: Date of Service: [place], [place] 02/19/2022 3:00 PM Medical Record Number: [id number] Patient Account Number: [id number] Date of Birth/Sex: Treating RN: [birth year] (36 y.o. [name] Primary Care Tushar Enns: [name] Other Clinician: [name] Referring Deryck Hippler: Treating Arriyana Rodell/Extender: RO BSO Delane Ginger, MICHA EL Cammy Brochure in  Treatment: 16 Encounter Discharge Information Items Discharge Condition: Stable Ambulatory Status: Ambulatory Discharge Destination: Home Transportation: Private Auto Accompanied By: self Schedule Follow-up Appointment: Yes Clinical Summary of Care: Electronic Signature(s) Signed: 02/20/2022 5:15:48 PM By: [name] Entered By: [name] on 02/19/2022 15:59:04 -------------------------------------------------------------------------------- Lower Extremity Assessment Details Patient Name: Date of Service: [place], [place] 02/19/2022 3:00 PM Medical Record Number: [id number] Patient Account Number: [id number] Date of Birth/Sex: Treating RN: 1985-08-14 (36 y.o. [name] Primary Care Shuntae Herzig: [name] Other Clinician: [name] Referring Joao [name]: Treating Noland Pizano/Extender: [address], [place] EL Adalynn, Corne, [name] Heads ([id number]) [phone].pdf Page 4 of 9 Weeks in Treatment: 16 Edema Assessment Assessed: [Left: Yes] [Right: No] Edema: [Left: Ye] [Right: s] Calf Left: Right: Point of Measurement: 31 cm From Medial Instep 60 cm Ankle Left: Right: Point of Measurement: 10 cm From Medial Instep 35 cm Vascular Assessment Pulses: Dorsalis Pedis Palpable: [Left:Yes] Electronic Signature(s) Signed: 02/20/2022 8:23:09 AM By: Gretta Cool, [name], RN, CWS, [name], [name] Signed: 02/20/2022 5:15:48 PM By: [name] Entered By: [name] on 02/19/2022 15:36:51 -------------------------------------------------------------------------------- Multi Wound Chart Details Patient Name: Date of Service: [place], [place] J. 02/19/2022 3:00 PM Medical Record Number: [id number] Patient Account Number: [id number] Date of Birth/Sex: Treating RN: [birth year] (36 y.o. [name] Primary Care Montie Swiderski: [name] Other Clinician: [name] Referring Jeramy Dimmick: Treating Nuh Lipton/Extender: RO BSO N, MICHA EL Cammy Brochure in Treatment: 16 Vital Signs Height(in): 66 Pulse(bpm): 91 Weight(lbs): 400 Blood Pressure(mmHg): 129/86 Body Mass Index(BMI): 64.6 Temperature(F): 98.3 Respiratory Rate(breaths/min): 16 [4:Photos:] [N/A:N/A] Left, Medial Lower Leg N/A N/A Wound Location: Gradually Appeared N/A N/A Wounding Event: Venous Leg Ulcer N/A N/A Primary Etiology: Hypertension, Peripheral Venous N/A N/A Comorbid History: Disease 10/17/2021 N/A N/A Date Acquired: [name], [name] ([id number]) [id number] Page 5 of 9 16 N/A N/A Weeks  of Treatment: Open N/A N/A Wound Status: No N/A N/A Wound Recurrence: 0.3x0.6x0.3 N/A N/A Measurements L x W x D (cm) 0.141 N/A N/A A (cm) : rea 0.042 N/A N/A Volume (cm) : 74.40% N/A N/A % Reduction in Area: 23.60% N/A N/A % Reduction in Volume: Full Thickness Without Exposed N/A N/A Classification: Support Structures Medium N/A N/A Exudate Amount: Serosanguineous N/A N/A Exudate Type: red, brown N/A N/A Exudate Color: Medium (34-66%) N/A N/A Granulation Amount: Red N/A N/A Granulation Quality: Medium (34-66%) N/A N/A Necrotic Amount: Fat Layer (Subcutaneous Tissue): Yes N/A N/A Exposed Structures: Fascia: No Tendon: No Muscle: No Joint: No Bone: No Medium (34-66%) N/A N/A Epithelialization: Treatment Notes Electronic Signature(s) Signed: 02/20/2022 5:15:48 PM By: [name] Entered By: [name] on 02/19/2022 15:37:04 -------------------------------------------------------------------------------- Multi-Disciplinary Care Plan Details Patient Name: Date of Service: [place], [place] J. 02/19/2022 3:00 PM Medical Record Number: [id number] Patient Account Number: [id number] Date of Birth/Sex: Treating RN: 29-Nov-1985 (36 y.o. [name] Primary Care Chanika Byland: [name] Other Clinician: [name] Referring Daffney Greenly: Treating Kelbi Renstrom/Extender: RO BSO Delane Ginger, MICHA EL Cammy Brochure in Treatment: 16 Active Inactive Wound/Skin Impairment Nursing Diagnoses: Knowledge deficit related to ulceration/compromised skin integrity Goals: Patient/caregiver will verbalize understanding of skin care regimen Date Initiated: 10/27/2021 Target Resolution Date: 11/27/2021 Goal Status: Active Ulcer/skin breakdown will have a volume reduction of 30% by week 4 Date Initiated: 10/27/2021 Target Resolution Date: 12/28/2021 Goal Status: Active Ulcer/skin breakdown will have a volume reduction of 50% by week 8 Date Initiated: 10/27/2021 Target Resolution Date: 01/27/2022 Goal Status: Active Ulcer/skin breakdown will have a volume reduction of 80% by week 12 Date Initiated: 10/27/2021 Target Resolution Date: 02/27/2022 Goal Status: Active Ulcer/skin breakdown will heal within 14 weeks Date Initiated: 10/27/2021 Target Resolution Date: 03/29/2022 IONNA, AVIS ([id number]) [id number] Page 6 of 9 Goal Status: Active Interventions: Assess patient/caregiver ability to obtain necessary supplies Assess patient/caregiver ability to perform ulcer/skin care regimen upon admission and as needed Assess ulceration(s) every visit Notes: Electronic Signature(s) Signed: 02/20/2022 8:23:09 AM By: Gretta Cool, [name], RN, CWS, [name], [name] Signed: 02/20/2022 5:15:48 PM By: [name] Entered By: [name] on 02/19/2022 15:36:55 -------------------------------------------------------------------------------- Pain Assessment Details Patient Name: Date of Service: [name] [place] J. 02/19/2022 3:00 PM Medical Record Number: [id number] Patient Account Number: [id number] Date of Birth/Sex: Treating RN: [birth year] (36 y.o. [name] Primary Care Jose Corvin: [name] Other Clinician: [name] Referring Prima Rayner: Treating Arayna Illescas/Extender: Eldridge Dace, MICHA EL Cammy Brochure in Treatment: 16 Active Problems Location of Pain Severity and  Description of Pain Patient Has Paino No Site Locations Pain Management and Medication Current Pain Management: Electronic Signature(s) Signed: 02/20/2022 8:23:09 AM By: Gretta Cool, [name], RN, CWS, [name], [name] Signed: 02/20/2022 5:15:48 PM By: [name] Entered By: [name] on 02/19/2022 15:28:41 [place], [name] Heads ([id number]) [id number] Page 7 of 9 -------------------------------------------------------------------------------- Patient/Caregiver Education Details Patient Name: Date of Service: [name], [name] [place] 11/6/2023andnbsp3:00 PM Medical Record Number: [id number] Patient Account Number: [id number] Date of Birth/Gender: Treating RN: Aug 26, 1985 (36 y.o. [name] Primary Care Physician: [name] Other Clinician: [name] Referring Physician: Treating Physician/Extender: RO BSO Delane Ginger, [place] EL Cammy Brochure in Treatment: 16 Education Assessment Education Provided To: Patient Education Topics Provided Wound/Skin Impairment: Handouts: Other: continue wound care as directed Methods: Explain/Verbal Responses: State content correctly Electronic Signature(s) Signed: 02/20/2022 5:15:48 PM By: [name] Entered By: [name] on 02/19/2022 15:51:06 -------------------------------------------------------------------------------- Wound Assessment Details Patient Name: Date  of Service: [place], [place] J. 02/19/2022 3:00 PM Medical Record Number: [id number] Patient Account Number: [id number] Date of Birth/Sex: Treating RN: [birth year] (36 y.o. [name] Primary Care Hildred Mollica: [name] Other Clinician: [name] Referring Grizelda Piscopo: Treating Lennix Kneisel/Extender: Eldridge Dace, MICHA EL Cammy Brochure in Treatment: 16 Wound Status Wound Number: 4 Primary Etiology: Venous Leg Ulcer Wound Location: Left, Medial Lower Leg Wound Status: Open Wounding Event: Gradually Appeared Comorbid History:  Hypertension, Peripheral Venous Disease Date Acquired: 10/17/2021 Weeks Of Treatment: 16 Clustered Wound: No Photos [name], [name] ([id number]) [id number] Page 8 of 9 Wound Measurements Length: (cm) 0.3 Width: (cm) 0.6 Depth: (cm) 0.3 Area: (cm) 0.141 Volume: (cm) 0.042 % Reduction in Area: 74.4% % Reduction in Volume: 23.6% Epithelialization: Medium (34-66%) Wound Description Classification: Full Thickness Without Exposed Support Structures Exudate Amount: Medium Exudate Type: Serosanguineous Exudate Color: red, brown Foul Odor After Cleansing: No Slough/Fibrino Yes Wound Bed Granulation Amount: Medium (34-66%) Exposed Structure Granulation Quality: Red Fascia Exposed: No Necrotic Amount: Medium (34-66%) Fat Layer (Subcutaneous Tissue) Exposed: Yes Necrotic Quality: Adherent Slough Tendon Exposed: No Muscle Exposed: No Joint Exposed: No Bone Exposed: No Treatment Notes Wound #4 (Lower Leg) Wound Laterality: Left, Medial Cleanser Byram Ancillary Kit - 15 Day Supply Discharge Instruction: Use supplies as instructed; Kit contains: (15) Saline Bullets; (15) 3x3 Gauze; 15 pr Gloves Soap and Water Discharge Instruction: Gently cleanse wound with antibacterial soap, rinse and pat dry prior to dressing wounds Peri-Wound Care Topical Primary Dressing IODOFLEX 0.9% Cadexomer Iodine Pad Discharge Instruction: Apply Iodoflex to wound bed only as directed. Secondary Dressing (BORDER) Zetuvit Plus SILICONE BORDER Dressing 4x4 (in/in) Discharge Instruction: Please do not put silicone bordered dressings under wraps. Use non-bordered dressing only. Secured With Compression Wrap tubi grip Discharge Instruction: size E double layer Compression Stockings Add-Ons Electronic Signature(s) Signed: 02/20/2022 8:23:09 AM By: Gretta Cool, [name], RN, CWS, [name], [name] Signed: 02/20/2022 5:15:48 PM By: [name] Entered By: [name] on 02/19/2022  15:35:56 [name], [name] Heads ([id number]) [id number] Page 9 of 9 -------------------------------------------------------------------------------- Vitals Details Patient Name: Date of Service: MAKYNA, [name] [place] J. 02/19/2022 3:00 PM Medical Record Number: [id number] Patient Account Number: [id number] Date of Birth/Sex: Treating RN: [birth year] (36 y.o. Charolette Forward, Kim Primary Care Nareh Matzke: [name] Other Clinician: [name] Referring Ameah Chanda: Treating Petros Ahart/Extender: RO BSO N, MICHA EL Cammy Brochure in Treatment: 16 Vital Signs Time Taken: 15:26 Temperature (F): 98.3 Height (in): 66 Pulse (bpm): 91 Weight (lbs): 400 Respiratory Rate (breaths/min): 16 Body Mass Index (BMI): 64.6 Blood Pressure (mmHg): 129/86 Reference Range: 80 - 120 mg / dl Electronic Signature(s) Signed: 02/20/2022 5:15:48 PM By: [name] Entered By: [name] on 02/19/2022 15:28:35

## 2022-02-21 NOTE — Progress Notes (Signed)
[name], [name] ([id number]) [id number] Page 1 of 8 Visit Report for 02/19/2022 HPI Details Patient Name: Date of Service: [name], [name] [place] J. 02/19/2022 3:00 PM Medical Record Number: [id number] Patient Account Number: [id number] Date of Birth/Sex: Treating RN: Oct 13, 1985 (36 y.o. [name] Primary Care Provider: [name] Other Clinician: [name] Referring Provider: Treating Provider/Extender: Eldridge Dace, MICHA EL Cammy Brochure in Treatment: 16 History of Present Illness HPI Description: 36 year old patient who started with having ulcerations on the right lower leg on the lateral part of her ankle for about 2 weeks. She was seen in the ER at [facility] and advised to see the wound care for a consultation. No X-rays of workup was done during the ER visit and no prescription for any medications of compression wraps were given. the patient is not diabetic but does have hypertension and her medications have been reviewed by me. 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 The right and left greater saphenous vein was found to be tortuous. Deep venous system was also not competent and there was reflux of more than 500 ms. She was then seen by Dr. Darene Lamer Early who recommended that the patient would not benefit from endovenous ablation and he had recommended vein stripping odd on the right side and multiple small phlebectomy procedures on the left side. the patient did not follow-up due to social economic reasons. She has not been wearing any compression stockings and has not taken any specific treatment for varicose veins for the last 3 years. 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 We have obtained some appointments to see the vascular surgeons by the end of the month and the patient would like to follow up with me at my [facility] on Wednesday, June 29. 10/14/2014 -- she could not see me yesterday in [place] and hence has come for a review today. She has a vascular workup to be done this afternoon at [facility]. She is doing fine otherwise. 10/22/2014 -- she was seen by Dr. Althea Charon and he has recommended surgical removal of her right saphenous vein from distal thigh to saphenofemoral odd junction and stab phlebectomy's of multiple large tributary branches throughout her thigh and calf. This would be done under general anesthesia in the outpatient setting. 10/29/2014 -- she is trying to work on a surgical date and in the meanwhile we have got insurance clearance for Apligraf and we will start this next week. 7/22 2016 -- she is here for the first application of Apligraf. 11/19/2014 -- she is here for a second application of Apligraf 11/26/2014 -- she has done fine after her last application of Apligraf and is awaiting her surgery which is scheduled for August 31. 12/03/2014 -- she is doing fine and is here for her third application of Apligraf. 12/21/2014 -- She had surgery on 12/15/2014 by Dr.Early who did #1 ligation and stripping of right great saphenous vein from distal thigh to saphenofemoral junction, #2 stab phlebectomy of large tributary varicose veins in the thigh popliteal space and calf. She had an Ace wrap up to her groin and this was removed today and the Unna's boot was also removed. 12/28/2014 -- she is here for her fourth  application of Apligraf. 01/06/2015 - he saw her vascular surgeon Dr. Donnetta Hutching who was pleased with her progress and he has confirmed that no surgical procedures could be attempted on the left side. 01/13/2015 -- her wound looks very good and she's been having no problems whatsoever. Readmission: 07/26/2020 upon  evaluation today patient presents for initial inspection here in our clinic for a new issue with her left leg although she is previously been seen due to issues with the right leg back in 2016. At that time she was seeing Dr. Donnetta Hutching who is a vein/vascular specialist in [place]. He has since semiretired from what I understand. He is working out of [employer] I believe. Nonetheless she tells me at the time that there was really nothing to do for her left leg although the right leg was where they did most of the work. Subsequently she states that she is done fairly well until just in the past week where she had issues with bleeding from what appears to be varicose vein on the left leg medially. Unfortunately this has continued to be an issue although she tells me at first it was coming much more significantly Down quite a bit but nonetheless has not completely resolved. Every time she showers she notices that it starts to drain a little bit more. She does have a history of chronic venous insufficiency, lymphedema, varicose veins bilaterally, and obesity. 08/02/2020 upon evaluation today patient appears to be doing about the same in regards to the ulcer on her left leg. She has some eschar covering there is definitely some fluid collecting underneath unfortunately. With that being said she tells me she is still having a tremendous amount of pain therefore she is really not able to allow me to clean this off very effectively to be perfectly honest. I think we need to try to soften this up 08/16/2020 upon evaluation today patient's wound is really not doing significantly better not really states about the same. She notes that the wrap just does not seem to be staying up very well at all unfortunately. No fevers, chills, nausea, vomiting, or diarrhea. She did cut it off once it starts to slide in order to alleviate some of the pressure from sliding Down. Fortunately there is no signs of active infection at this  time which is great news. 08/23/2020 upon evaluation today patient appears to be doing well 08/23/2020 upon evaluation today patient appears to be doing well with regard to her wound all things considered. Fortunately there does not appear to be any signs of active infection at this time which is great news. She has been tolerating the dressing [name], [name] ([id number]) [id number] Page 2 of 8 changes without complication and overall I am extremely pleased with where things stand at this point. She does have her appointment with vascular in [facility] on June 9. 08/30/2020 upon evaluation today patient actually appears to be doing decently well in regard to her wound. Fortunately there is no signs of active infection which is great news. Nonetheless I do believe that the patient is going require little bit of debridement if she is okay with me attempting that today I think that will help clean off some of the necrotic tissue. Fortunately there does not appear to be otherwise any evidence of active infection which is also great news. 09/19/2020 upon evaluation today patient appears to be doing a little better in regard to her wound as compared to previous. Fortunately there does not appear to be any  signs of active infection overall. No fever chills noted. I do believe that the Iodosorb has made this a little bit better with regard to the overall size and appearance of the wound bed though again she does still have quite a ways to go to get this to heal she still very tender to touch. 09/27/2020 upon evaluation today patient appears to actually be doing quite well with regard to her wound. This is measuring much smaller which is great news. With that being said she did see vein and vascular in [facility] and they subsequently recommended that surgery is really what she probably needs to go forward with sounds like the potential for venous ablation. With that being said the  patient tells me this is just not the right time for her to be able to proceed with any type of surgery which I completely understand. Nonetheless I do believe that she would continue to benefit from compression but again that is really not something that she is able to easily do. 10/04/2020 upon evaluation today patient appears to be doing about the same in regard to her wound. This is measuring a little bit smaller but nonetheless still is open and again has some slough and biofilm noted on the surface of the wound. There does not appear to be any signs of active infection which is great news. No fevers, chills, nausea, vomiting, or diarrhea. 10/04/2020 upon evaluation today patient appears to be doing well with regard to her wound. She has been tolerating dressing changes without complication. Fortunately there does not appear to be any signs of active infection which is great news. No fevers, chills, nausea, vomiting, or diarrhea. 10/25/2020 upon evaluation today patient appears to be doing well with regard to her wound. She has been tolerating the dressing changes without complication. Fortunately there is no signs of active infection at this time. No fevers, chills, nausea, vomiting, or diarrhea. 11/01/2020 upon evaluation today patient with regard to her wound. She has been tolerating the dressing changes without complication. Fortunately there does not appear to be any signs of infection which is great news. No fever chills noted 11/15/2020 upon evaluation today patient appears to be doing well with regard to her wound. Fortunately there is no signs of active infection at this time. No fevers, chills, nausea, vomiting, or diarrhea. With that being said she continues to have a significant amount of pain at the site even though this is very close to complete closure. She also had several varicose veins around the area which were also problematic. Overall however I feel like the patient is  making excellent progress. 11/28/2020 upon evaluation today patient appears to be doing well with regard to her leg ulcer. Again were not really able to debride or compression wrap her due to discomfort and pain. She does not allow for that. With that being said we have been using Iodosorb which does seem to be doing decently well. Fortunately there is no signs of active infection at this time which is great news. No fevers, chills, nausea, vomiting, or diarrhea. 8/31; patient presents for 2-week follow-up. She has been using Iodosorb. She reports that the wound is closed. She denies signs of infection. Readmission: 10-27-2021 upon evaluation this is a patient that presents today whom I have previously seen this is pretty much for the same issue though I think a little bit higher than the last time I saw her. She does have a history of chronic venous insufficiency and hypertension along with  varicose veins. Subsequently she does have an ulceration which spontaneously ruptured she has not been wearing any compression which I think is a big part of the issue here. We discussed this before I really think she probably needs to be wearing her compression therapy, she probably needs lymphedema pumps if she can ever wear the compression for a significant amount of time to get these, and subsequently also think that she needs to be elevating her legs is much as possible she may even need some vascular intervention in regard to her veins. All of this was reiterated and discussed with her today to reinforce what needs to happen in order to ensure that her legs do not get a lot worse. The patient voiced understanding. She tells me that she knows because she is seeing her mom go through a lot of this as well how bad things can get. 11-03-2021 upon evaluation today patient appears to be doing well with regard to her wound. Fortunately there does not appear to be any signs of active infection at this time. She is  measuring a little bit bigger but I think this is because the wound is actually cleaning up a bit here. 7/27; left lateral leg wound not any smaller but perhaps with a cleaner surface. She is using Iodoflex to help with the latter and using Tubigrip. She has chronic venous insufficiency with secondary lymphedema. She is apparently followed by vein and vascular and is being scheduled for an ablation 11-17-2021 upon evaluation today patient appears to be doing well with regard to her wound this is actually showing signs of improvement which is great news. Fortunately I do not see any evidence of active infection locally or systemically at this time which is great news. No fevers, chills, nausea, vomiting, or diarrhea. 11-24-2021 upon evaluation today patient's wound is actually showing signs of significant improvement. Unfortunately she had quite a bit of pain with debridement last week I do believe it was beneficial but at the same time she is doing much better but still really does not want this debrided again I think being that it is looking a whole lot better I would try to avoid that today especially since it caused her so much discomfort that is really not the goal and I explained that to the patient today she voiced understanding and knows that it needed to be done but still states that it was quite painful. 11-30-2021 upon evaluation today patient appears to be doing excellent in regard to her wound this is actually showing signs of excellent improvement I am very pleased with where things stand. She does have her venous ablation appointment for October 17. 12-21-2021 upon evaluation today patient appears to be doing better in regard to her wound this is measuring smaller and looking better as well. Fortunately I do not see any signs of active infection locally or systemically which is great news. 01-01-2022 upon evaluation today patient appears to be doing well currently in regard to her wound. She is  showing signs of improvement which is great news and overall I do not see any signs of active infection locally or systemically at this time. 01-08-2022 upon evaluation today patient appears to be doing well currently in regard to her wound she is actually showing signs of significant improvement which is great news. Fortunately I do not see any evidence of active infection locally or systemically at this time. I do believe that we are on the right track. She also has her  appointment October 19 for the venous ablation. 01-16-2022 upon evaluation today patient's wound actually is showing some signs of improvement although this is very slow. Fortunately I do not see any evidence of infection at this time. The volume is a little bit more although the size is smaller I think this is due to the fact that we are slowly cleaning this area out effectively. 11/6 continued improvement the patient is using Iodoflex and a Tubigrip E. She was supposed to have venous surgery at vein and vascular in [place] however somehow this is gotten delayed till December 14. Electronic Signature(s) Signed: 02/19/2022 4:14:14 PM By: Linton Ham MD Entered By: Linton Ham on 02/19/2022 [place], [name] ([id number]) [id number] Page 3 of 8 -------------------------------------------------------------------------------- Physical Exam Details Patient Name: Date of Service: [name], [name] [place] J. 02/19/2022 3:00 PM Medical Record Number: [id number] Patient Account Number: [id number] Date of Birth/Sex: Treating RN: Mar 17, 1986 (36 y.o. [name] Primary Care Provider: [name] Other Clinician: [name] Referring Provider: Treating Provider/Extender: RO BSO N, MICHA EL Cammy Brochure in Treatment: 16 Constitutional Sitting or standing Blood Pressure is within target range for patient.. Pulse regular and within target range for patient.Marland Kitchen Respirations regular,  non-labored and within target range.. Temperature is normal and within the target range for the patient.Marland Kitchen appears in no distress. Cardiovascular Pedal pulses are palpable on the left. Notes Wound exam; under illumination surface looks fairly healthy although there is still large part of this that is open. Her edema control is marginal at best. No evidence of surrounding infection Electronic Signature(s) Signed: 02/19/2022 4:14:14 PM By: Linton Ham MD Entered By: Linton Ham on 02/19/2022 16:09:54 -------------------------------------------------------------------------------- Physician Orders Details Patient Name: Date of Service: [name], [name] 02/19/2022 3:00 PM Medical Record Number: [id number] Patient Account Number: [id number] Date of Birth/Sex: Treating RN: 13-May-1985 (36 y.o. [name] Primary Care Provider: [name] Other Clinician: [name] Referring Provider: Treating Provider/Extender: RO BSO Delane Ginger, MICHA EL Cammy Brochure in Treatment: [id number] Verbal / Phone Orders: No Diagnosis Coding Follow-up Appointments Return Appointment in 1 week. Bathing/ Shower/ Hygiene May shower; gently cleanse wound with antibacterial soap, rinse and pat dry prior to dressing wounds Anesthetic (Use 'Patient Medications' Section for Anesthetic Order Entry) Lidocaine applied to wound bed Edema Control - Lymphedema / Segmental Compressive Device / Other Tubigrip double layer applied - size E left leg double Elevate, Exercise Daily and Avoid Standing for Long Periods of Time. VERSA, CRATON ([id number]) [id number] Page 4 of 8 Elevate legs to the level of the heart and pump ankles as often as possible Elevate leg(s) parallel to the floor when sitting. Wound Treatment Wound #4 - Lower Leg Wound Laterality: Left, Medial Cleanser: Byram Ancillary Kit - 15 Day Supply (Generic) 3 x Per Week/30 Days Discharge Instructions: Use supplies as  instructed; Kit contains: (15) Saline Bullets; (15) 3x3 Gauze; 15 pr Gloves Cleanser: Soap and Water 3 x Per Week/30 Days Discharge Instructions: Gently cleanse wound with antibacterial soap, rinse and pat dry prior to dressing wounds Prim Dressing: IODOFLEX 0.9% Cadexomer Iodine Pad (Generic) 3 x Per Week/30 Days ary Discharge Instructions: Apply Iodoflex to wound bed only as directed. Secondary Dressing: (BORDER) Zetuvit Plus SILICONE BORDER Dressing 4x4 (in/in) (Generic) 3 x Per Week/30 Days Discharge Instructions: Please do not put silicone bordered dressings under wraps. Use non-bordered dressing only. Compression Wrap: tubi grip 3 x Per Week/30 Days Discharge Instructions: size E double layer Electronic Signature(s) Signed: 02/19/2022 4:14:14 PM  By: Linton Ham MD Signed: 02/20/2022 5:15:48 PM By: [name] Entered By: [name] on 02/19/2022 15:49:47 -------------------------------------------------------------------------------- Problem List Details Patient Name: Date of Service: [name] 02/19/2022 3:00 PM Medical Record Number: [id number] Patient Account Number: [id number] Date of Birth/Sex: Treating RN: 1986/03/18 (36 y.o. [name] Primary Care Provider: [name] Other Clinician: [name] Referring Provider: Treating Provider/Extender: Eldridge Dace, MICHA EL Cammy Brochure in Treatment: 16 Active Problems ICD-10 Encounter Code Description Active Date MDM Diagnosis I83.028 Varicose veins of left lower extremity with ulcer other part of lower leg 10/27/2021 No Yes I89.0 Lymphedema, not elsewhere classified 10/27/2021 No Yes L97.822 Non-pressure chronic ulcer of other part of left lower leg with fat layer exposed7/14/2023 No Yes E66.01 Morbid (severe) obesity due to excess calories 10/27/2021 No Yes Inactive Problems Resolved Problems [name], [name] ([id number]) [id number] Page 5 of 8 Electronic  Signature(s) Signed: 02/19/2022 4:14:14 PM By: Linton Ham MD Entered By: Linton Ham on 02/19/2022 16:05:05 -------------------------------------------------------------------------------- Progress Note Details Patient Name: Date of Service: [name], [name] [place] J. 02/19/2022 3:00 PM Medical Record Number: [id number] Patient Account Number: [id number] Date of Birth/Sex: Treating RN: 1985/12/22 (36 y.o. [name] Primary Care Provider: [name] Other Clinician: [name] Referring Provider: Treating Provider/Extender: Eldridge Dace, MICHA EL Cammy Brochure in Treatment: 16 Subjective History of Present Illness (HPI) 36 year old patient who started with having ulcerations on the right lower leg on the lateral part of her ankle for about 2 weeks. She was seen in the ER at [facility] and advised to see the wound care for a consultation. No X-rays of workup was done during the ER visit and no prescription for any medications of compression wraps were given. the patient is not diabetic but does have hypertension and her medications have been reviewed by me. 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 The right and left greater saphenous vein was found to be tortuous. Deep venous system was also not competent and there was reflux of more than 500 ms. She was then seen by Dr. Darene Lamer Early who recommended that the patient would not benefit from endovenous ablation and he had recommended vein stripping odd on the right side and multiple small phlebectomy procedures on the left side. the patient did not follow-up due to social economic reasons. She has not been wearing any compression stockings and has not taken any specific treatment for varicose veins for the last 3 years. 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 We have obtained some appointments to see the vascular surgeons by the end of the month and the patient would like to follow up with me at my [facility] on Wednesday, June 29. 10/14/2014 -- she could not see me yesterday in [place] and hence has come for a review today. She has a vascular workup to be done this afternoon at [facility]. She is doing fine otherwise. 10/22/2014 -- she was seen by Dr. Althea Charon and he has recommended surgical removal of her right saphenous vein from distal thigh to saphenofemoral odd junction and stab phlebectomy's of multiple large tributary branches throughout her thigh and calf. This would be done under general anesthesia in the outpatient  setting. 10/29/2014 -- she is trying to work on a surgical date and in the meanwhile we have got insurance clearance for Apligraf and we will start this next week. 7/22 2016 -- she is here for the first application of Apligraf. 11/19/2014 -- she is here for a second application of Apligraf 11/26/2014 -- she has done fine after her last application of Apligraf and is awaiting her surgery which is scheduled for August 31. 12/03/2014 -- she is doing fine and is here for her third application of Apligraf. 12/21/2014 -- She had surgery on 12/15/2014 by Dr.Early who did #1 ligation and stripping of right great saphenous vein from distal thigh to saphenofemoral junction, #2 stab phlebectomy of large tributary varicose veins in the thigh popliteal space and calf. She had an Ace wrap up to her groin and this was removed today and the Unna's boot was also removed. 12/28/2014 -- she is here for her fourth application of Apligraf. 01/06/2015 - he saw her vascular surgeon Dr. Donnetta Hutching who was pleased with her progress and he has confirmed that no surgical procedures could be attempted on the left side. 01/13/2015 -- her wound looks  very good and she's been having no problems whatsoever. Readmission: 07/26/2020 upon evaluation today patient presents for initial inspection here in our clinic for a new issue with her left leg although she is previously been seen due to issues with the right leg back in 2016. At that time she was seeing Dr. Donnetta Hutching who is a vein/vascular specialist in [employer]. He has since semiretired from what I understand. He is working out of [employer] I believe. Nonetheless she tells me at the time that there was really nothing to do for her left leg although the right leg was where they did most of the work. Subsequently she states that she is done fairly well until just in the past week where she had issues with bleeding from what appears to be varicose vein on the left leg medially. Unfortunately this has continued to be an issue although she tells me at first it was coming much more significantly Down quite a bit but nonetheless has not completely resolved. Every time she showers she notices that it starts to drain a little bit more. She does have a history of chronic venous insufficiency, lymphedema, varicose veins bilaterally, and obesity. 08/02/2020 upon evaluation today patient appears to be doing about the same in regards to the ulcer on her left leg. She has some eschar covering there is definitely some fluid collecting underneath unfortunately. With that being said she tells me she is still having a tremendous amount of pain therefore she is really not able to allow me to clean this off very effectively to be perfectly honest. I think we need to try to soften this up 08/16/2020 upon evaluation today patient's wound is really not doing significantly better not really states about the same. She notes that the wrap just does not seem to be staying up very well at all unfortunately. No fevers, chills, nausea, vomiting, or diarrhea. She did cut it off once it starts to slide in order to [name], [name]  ([id number]) [id number] Page 6 of 8 alleviate some of the pressure from sliding Down. Fortunately there is no signs of active infection at this time which is great news. 08/23/2020 upon evaluation today patient appears to be doing well 08/23/2020 upon evaluation today patient appears to be doing well with regard to her wound all things considered. Fortunately there does  not appear to be any signs of active infection at this time which is great news. She has been tolerating the dressing changes without complication and overall I am extremely pleased with where things stand at this point. She does have her appointment with vascular in [facility] on June 9. 08/30/2020 upon evaluation today patient actually appears to be doing decently well in regard to her wound. Fortunately there is no signs of active infection which is great news. Nonetheless I do believe that the patient is going require little bit of debridement if she is okay with me attempting that today I think that will help clean off some of the necrotic tissue. Fortunately there does not appear to be otherwise any evidence of active infection which is also great news. 09/19/2020 upon evaluation today patient appears to be doing a little better in regard to her wound as compared to previous. Fortunately there does not appear to be any signs of active infection overall. No fever chills noted. I do believe that the Iodosorb has made this a little bit better with regard to the overall size and appearance of the wound bed though again she does still have quite a ways to go to get this to heal she still very tender to touch. 09/27/2020 upon evaluation today patient appears to actually be doing quite well with regard to her wound. This is measuring much smaller which is great news. With that being said she did see vein and vascular in [facility] and they subsequently recommended that surgery is really what she probably needs to  go forward with sounds like the potential for venous ablation. With that being said the patient tells me this is just not the right time for her to be able to proceed with any type of surgery which I completely understand. Nonetheless I do believe that she would continue to benefit from compression but again that is really not something that she is able to easily do. 10/04/2020 upon evaluation today patient appears to be doing about the same in regard to her wound. This is measuring a little bit smaller but nonetheless still is open and again has some slough and biofilm noted on the surface of the wound. There does not appear to be any signs of active infection which is great news. No fevers, chills, nausea, vomiting, or diarrhea. 10/04/2020 upon evaluation today patient appears to be doing well with regard to her wound. She has been tolerating dressing changes without complication. Fortunately there does not appear to be any signs of active infection which is great news. No fevers, chills, nausea, vomiting, or diarrhea. 10/25/2020 upon evaluation today patient appears to be doing well with regard to her wound. She has been tolerating the dressing changes without complication. Fortunately there is no signs of active infection at this time. No fevers, chills, nausea, vomiting, or diarrhea. 11/01/2020 upon evaluation today patient with regard to her wound. She has been tolerating the dressing changes without complication. Fortunately there does not appear to be any signs of infection which is great news. No fever chills noted 11/15/2020 upon evaluation today patient appears to be doing well with regard to her wound. Fortunately there is no signs of active infection at this time. No fevers, chills, nausea, vomiting, or diarrhea. With that being said she continues to have a significant amount of pain at the site even though this is very close to complete closure. She also had several varicose veins around the  area which were also problematic. Overall  however I feel like the patient is making excellent progress. 11/28/2020 upon evaluation today patient appears to be doing well with regard to her leg ulcer. Again were not really able to debride or compression wrap her due to discomfort and pain. She does not allow for that. With that being said we have been using Iodosorb which does seem to be doing decently well. Fortunately there is no signs of active infection at this time which is great news. No fevers, chills, nausea, vomiting, or diarrhea. 8/31; patient presents for 2-week follow-up. She has been using Iodosorb. She reports that the wound is closed. She denies signs of infection. Readmission: 10-27-2021 upon evaluation this is a patient that presents today whom I have previously seen this is pretty much for the same issue though I think a little bit higher than the last time I saw her. She does have a history of chronic venous insufficiency and hypertension along with varicose veins. Subsequently she does have an ulceration which spontaneously ruptured she has not been wearing any compression which I think is a big part of the issue here. We discussed this before I really think she probably needs to be wearing her compression therapy, she probably needs lymphedema pumps if she can ever wear the compression for a significant amount of time to get these, and subsequently also think that she needs to be elevating her legs is much as possible she may even need some vascular intervention in regard to her veins. All of this was reiterated and discussed with her today to reinforce what needs to happen in order to ensure that her legs do not get a lot worse. The patient voiced understanding. She tells me that she knows because she is seeing her mom go through a lot of this as well how bad things can get. 11-03-2021 upon evaluation today patient appears to be doing well with regard to her wound. Fortunately there  does not appear to be any signs of active infection at this time. She is measuring a little bit bigger but I think this is because the wound is actually cleaning up a bit here. 7/27; left lateral leg wound not any smaller but perhaps with a cleaner surface. She is using Iodoflex to help with the latter and using Tubigrip. She has chronic venous insufficiency with secondary lymphedema. She is apparently followed by vein and vascular and is being scheduled for an ablation 11-17-2021 upon evaluation today patient appears to be doing well with regard to her wound this is actually showing signs of improvement which is great news. Fortunately I do not see any evidence of active infection locally or systemically at this time which is great news. No fevers, chills, nausea, vomiting, or diarrhea. 11-24-2021 upon evaluation today patient's wound is actually showing signs of significant improvement. Unfortunately she had quite a bit of pain with debridement last week I do believe it was beneficial but at the same time she is doing much better but still really does not want this debrided again I think being that it is looking a whole lot better I would try to avoid that today especially since it caused her so much discomfort that is really not the goal and I explained that to the patient today she voiced understanding and knows that it needed to be done but still states that it was quite painful. 11-30-2021 upon evaluation today patient appears to be doing excellent in regard to her wound this is actually showing signs of excellent improvement  I am very pleased with where things stand. She does have her venous ablation appointment for October 17. 12-21-2021 upon evaluation today patient appears to be doing better in regard to her wound this is measuring smaller and looking better as well. Fortunately I do not see any signs of active infection locally or systemically which is great news. 01-01-2022 upon evaluation today  patient appears to be doing well currently in regard to her wound. She is showing signs of improvement which is great news and overall I do not see any signs of active infection locally or systemically at this time. 01-08-2022 upon evaluation today patient appears to be doing well currently in regard to her wound she is actually showing signs of significant improvement which is great news. Fortunately I do not see any evidence of active infection locally or systemically at this time. I do believe that we are on the right track. She also has her appointment October 19 for the venous ablation. 01-16-2022 upon evaluation today patient's wound actually is showing some signs of improvement although this is very slow. Fortunately I do not see any evidence of infection at this time. The volume is a little bit more although the size is smaller I think this is due to the fact that we are slowly cleaning this area out effectively. 11/6 continued improvement the patient is using Iodoflex and a Tubigrip E. She was supposed to have venous surgery at vein and vascular in [place] however somehow this is gotten delayed till December 14. [name], [name] ([id number]) [id number] Page 7 of 8 Objective Constitutional Sitting or standing Blood Pressure is within target range for patient.. Pulse regular and within target range for patient.Marland Kitchen Respirations regular, non-labored and within target range.. Temperature is normal and within the target range for the patient.Marland Kitchen appears in no distress. Vitals Time Taken: 3:26 PM, Height: 66 in, Weight: 400 lbs, BMI: 64.6, Temperature: 98.3 F, Pulse: 91 bpm, Respiratory Rate: 16 breaths/min, Blood Pressure: 129/86 mmHg. Cardiovascular Pedal pulses are palpable on the left. General Notes: Wound exam; under illumination surface looks fairly healthy although there is still large part of this that is open. Her edema control is marginal at best. No  evidence of surrounding infection Integumentary (Hair, Skin) Wound #4 status is Open. Original cause of wound was Gradually Appeared. The date acquired was: 10/17/2021. The wound has been in treatment 16 weeks. The wound is located on the Left,Medial Lower Leg. The wound measures 0.3cm length x 0.6cm width x 0.3cm depth; 0.141cm^2 area and 0.042cm^3 volume. There is Fat Layer (Subcutaneous Tissue) exposed. There is a medium amount of serosanguineous drainage noted. There is medium (34-66%) red granulation within the wound bed. There is a medium (34-66%) amount of necrotic tissue within the wound bed including Adherent Slough. 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 Bathing/ Shower/ Hygiene: May shower; gently cleanse wound with antibacterial soap, rinse and pat dry prior to dressing wounds Anesthetic (Use 'Patient Medications' Section for Anesthetic Order Entry): Lidocaine applied to wound bed Edema Control - Lymphedema / Segmental Compressive Device / Other: Tubigrip double layer applied - size E left leg double Elevate, Exercise Daily and Avoid Standing for Long Periods of Time. Elevate legs to the level of the heart and  pump ankles as often as possible Elevate leg(s) parallel to the floor when sitting. WOUND #4: - Lower Leg Wound Laterality: Left, Medial Cleanser: Byram Ancillary Kit - 15 Day Supply (Generic) 3 x Per Week/30 Days Discharge Instructions: Use supplies as instructed; Kit contains: (15) Saline Bullets; (15) 3x3 Gauze; 15 pr Gloves Cleanser: Soap and Water 3 x Per Week/30 Days Discharge Instructions: Gently cleanse wound with antibacterial soap, rinse and pat dry prior to dressing  wounds Prim Dressing: IODOFLEX 0.9% Cadexomer Iodine Pad (Generic) 3 x Per Week/30 Days ary Discharge Instructions: Apply Iodoflex to wound bed only as directed. Secondary Dressing: (BORDER) Zetuvit Plus SILICONE BORDER Dressing 4x4 (in/in) (Generic) 3 x Per Week/30 Days Discharge Instructions: Please do not put silicone bordered dressings under wraps. Use non-bordered dressing only. Com pression Wrap: tubi grip 3 x Per Week/30 Days Discharge Instructions: size E double layer 1. The wound is actually improving. I did not change the primary dressing which is Iodoflex with a double tubi E wrap which she is changing her self. She still works and is on her feet quite a bit 2. As I understand things she had venous reflux studies through vein and vascular in [facility] and her consideration for procedure [o Greater saphenous vein ablation] is not until December 14. Electronic Signature(s) Signed: 02/19/2022 4:14:14 PM By: Linton Ham MD Entered By: Linton Ham on 02/19/2022 16:12:40 [name], [name] ([id number]) [id number] Page 8 of 8 -------------------------------------------------------------------------------- SuperBill Details Patient Name: Date of Service: KILIE, RUND [place]. 02/19/2022 Medical Record Number: [id number] Patient Account Number: [id number] Date of Birth/Sex: Treating RN: 17-May-1985 (36 y.o. Charolette Forward, Kim Primary Care Provider: [name] Other Clinician: [name] Referring Provider: Treating Provider/Extender: Eldridge Dace, Bailey EL Cammy Brochure in Treatment: 16 Diagnosis Coding ICD-10 Codes Code Description I83.028 Varicose veins of left lower extremity with ulcer other part of lower leg I89.0 Lymphedema, not elsewhere classified L97.822 Non-pressure chronic ulcer of other part of left lower leg with fat layer exposed E66.01 Morbid (severe) obesity due to excess calories Facility Procedures : CPT4 Code:  [id number] Description: 53202 - WOUND CARE VISIT-LEV 3 EST PT Modifier: Quantity: 1 Physician Procedures : CPT4 Code Description Modifier [id number] - WC PHYS LEVEL 3 - EST PT ICD-10 Diagnosis Description I83.028 Varicose veins of left lower extremity with ulcer other part of lower leg I89.0 Lymphedema, not elsewhere classified L97.822 Non-pressure  chronic ulcer of other part of left lower leg with fat layer exposed Quantity: 1 Electronic Signature(s) Signed: 02/19/2022 4:14:14 PM By: Linton Ham MD Entered By: Linton Ham on 02/19/2022 16:13:04

## 2022-02-27 ENCOUNTER — Encounter: Payer: BC Managed Care – PPO | Admitting: Physician Assistant

## 2022-02-27 DIAGNOSIS — I83028 Varicose veins of left lower extremity with ulcer other part of lower leg: Secondary | ICD-10-CM | POA: Diagnosis not present

## 2022-02-27 NOTE — Progress Notes (Signed)
[name], [name] ([id number]) [id number] Page 1 of 9 Visit Report for 02/27/2022 Chief Complaint Document Details Patient Name: Date of Service: [name], [name] [place]. 02/27/2022 3:15 PM Medical Record Number: [id number] Patient Account Number: [id number] Date of Birth/Sex: Treating RN: 09-26-1985 (36 y.o. [name] Primary Care Provider: [name] Other Clinician: Referring Provider: Treating Provider/Extender: Jaclynn Major in Treatment: 17 Information Obtained from: Patient Chief Complaint Left LE Ulcer Electronic Signature(s) Unsigned Previous Signature: 02/27/2022 3:27:10 PM Version By: Lenda Kelp PA-C Entered By: Midge Aver on 02/27/2022 16:06:04 -------------------------------------------------------------------------------- HPI Details Patient Name: Date of Service: [name] Moody MA J. 02/27/2022 3:15 PM Medical Record Number: [id number] Patient Account Number: [id number] Date of Birth/Sex: Treating RN: 1985-05-30 (36 y.o. [name] Primary Care Provider: [name] Other Clinician: Referring Provider: Treating Provider/Extender: Jaclynn Major in Treatment: 17 History of Present Illness HPI Description: 36 year old patient who started with having ulcerations on the right lower leg on the lateral part of her ankle for about 2 weeks. She was seen in the ER at [facility] and advised to see the wound care for a consultation. No X-rays of workup was done during the ER visit and no prescription for any medications of compression wraps were given. the patient is not diabetic but does have hypertension and her medications have been reviewed by me. 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 The right and left greater  saphenous vein was found to be tortuous. Deep venous system was also not competent and there was reflux of more than 500 ms. She was then seen by Dr. Karie Schwalbe Early who recommended that the patient would not benefit from endovenous ablation and he had recommended vein stripping odd on the right side and multiple small phlebectomy procedures on the left side. the patient did not follow-up due to social economic reasons. She has not been wearing any compression stockings and has not taken any specific treatment for varicose veins for the last 3 years. 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 We have obtained some appointments to see the vascular surgeons by the end of the month and the patient would like to follow up with me at [address], [place] ([id number]) [id number] Page 2 of 9 clinic on Wednesday, June 29. 10/14/2014 -- she could not see me yesterday in [place] and hence has come for a review today. She has a vascular workup to be done this afternoon at [facility]. She is doing fine otherwise. 10/22/2014 -- she was seen by Dr. Brantley Fling and he has recommended surgical removal of her right saphenous vein from distal thigh to saphenofemoral odd junction and stab phlebectomy's of multiple large tributary branches throughout her thigh and calf. This would be done under general anesthesia in the outpatient setting. 10/29/2014 -- she is trying to work on a surgical date and in the meanwhile we have got insurance clearance for Apligraf and we will start this next week. 7/22 2016 -- she is here for the first application of Apligraf. 11/19/2014 -- she is here for a second application of Apligraf 11/26/2014 -- she has done fine after her last application of Apligraf and is awaiting her surgery which is scheduled  for August 31. 12/03/2014 -- she is doing fine and is  here for her third application of Apligraf. 12/21/2014 -- She had surgery on 12/15/2014 by Dr.Early who did #1 ligation and stripping of right great saphenous vein from distal thigh to saphenofemoral junction, #2 stab phlebectomy of large tributary varicose veins in the thigh popliteal space and calf. She had an Ace wrap up to her groin and this was removed today and the Unna's boot was also removed. 12/28/2014 -- she is here for her fourth application of Apligraf. 01/06/2015 - he saw her vascular surgeon Dr. Arbie Cookey who was pleased with her progress and he has confirmed that no surgical procedures could be attempted on the left side. 01/13/2015 -- her wound looks very good and she's been having no problems whatsoever. Readmission: 07/26/2020 upon evaluation today patient presents for initial inspection here in our clinic for a new issue with her left leg although she is previously been seen due to issues with the right leg back in 2016. At that time she was seeing Dr. Arbie Cookey who is a vein/vascular specialist in [place]. He has since semiretired from what I understand. He is working out of [employer] I believe. Nonetheless she tells me at the time that there was really nothing to do for her left leg although the right leg was where they did most of the work. Subsequently she states that she is done fairly well until just in the past week where she had issues with bleeding from what appears to be varicose vein on the left leg medially. Unfortunately this has continued to be an issue although she tells me at first it was coming much more significantly Down quite a bit but nonetheless has not completely resolved. Every time she showers she notices that it starts to drain a little bit more. She does have a history of chronic venous insufficiency, lymphedema, varicose veins bilaterally, and obesity. 08/02/2020 upon evaluation today patient appears to be doing about the same in regards to the ulcer on her  left leg. She has some eschar covering there is definitely some fluid collecting underneath unfortunately. With that being said she tells me she is still having a tremendous amount of pain therefore she is really not able to allow me to clean this off very effectively to be perfectly honest. I think we need to try to soften this up 08/16/2020 upon evaluation today patient's wound is really not doing significantly better not really states about the same. She notes that the wrap just does not seem to be staying up very well at all unfortunately. No fevers, chills, nausea, vomiting, or diarrhea. She did cut it off once it starts to slide in order to alleviate some of the pressure from sliding Down. Fortunately there is no signs of active infection at this time which is great news. 08/23/2020 upon evaluation today patient appears to be doing well 08/23/2020 upon evaluation today patient appears to be doing well with regard to her wound all things considered. Fortunately there does not appear to be any signs of active infection at this time which is great news. She has been tolerating the dressing changes without complication and overall I am extremely pleased with where things stand at this point. She does have her appointment with vascular in [facility] on June 9. 08/30/2020 upon evaluation today patient actually appears to be doing decently well in regard to her wound. Fortunately there is no signs of active infection which is great news. Nonetheless I do  believe that the patient is going require little bit of debridement if she is okay with me attempting that today I think that will help clean off some of the necrotic tissue. Fortunately there does not appear to be otherwise any evidence of active infection which is also great news. 09/19/2020 upon evaluation today patient appears to be doing a little better in regard to her wound as compared to previous. Fortunately there does not appear to be any signs of  active infection overall. No fever chills noted. I do believe that the Iodosorb has made this a little bit better with regard to the overall size and appearance of the wound bed though again she does still have quite a ways to go to get this to heal she still very tender to touch. 09/27/2020 upon evaluation today patient appears to actually be doing quite well with regard to her wound. This is measuring much smaller which is great news. With that being said [facility] and they subsequently recommended that surgery is really what she probably needs to go forward with sounds like the potential for venous ablation. With that being said the patient tells me this is just not the right time for her to be able to proceed with any type of surgery which I completely understand. Nonetheless I do believe that she would continue to benefit from compression but again that is really not something that she is able to easily do. 10/04/2020 upon evaluation today patient appears to be doing about the same in regard to her wound. This is measuring a little bit smaller but nonetheless still is open and again has some slough and biofilm noted on the surface of the wound. There does not appear to be any signs of active infection which is great news. No fevers, chills, nausea, vomiting, or diarrhea. 10/04/2020 upon evaluation today patient appears to be doing well with regard to her wound. She has been tolerating dressing changes without complication. Fortunately there does not appear to be any signs of active infection which is great news. No fevers, chills, nausea, vomiting, or diarrhea. 10/25/2020 upon evaluation today patient appears to be doing well with regard to her wound. She has been tolerating the dressing changes without complication. Fortunately there is no signs of active infection at this time. No fevers, chills, nausea, vomiting, or diarrhea. 11/01/2020 upon evaluation today patient  with regard to her wound. She has been tolerating the dressing changes without complication. Fortunately there does not appear to be any signs of infection which is great news. No fever chills noted 11/15/2020 upon evaluation today patient appears to be doing well with regard to her wound. Fortunately there is no signs of active infection at this time. No fevers, chills, nausea, vomiting, or diarrhea. With that being said she continues to have a significant amount of pain at the site even though this is very close to complete closure. She also had several varicose veins around the area which were also problematic. Overall however I feel like the patient is making excellent progress. 11/28/2020 upon evaluation today patient appears to be doing well with regard to her leg ulcer. Again were not really able to debride or compression wrap her due to discomfort and pain. She does not allow for that. With that being said we have been using Iodosorb which does seem to be doing decently well. Fortunately there is no signs of active infection at this time which is great news. No fevers,  chills, nausea, vomiting, or diarrhea. 8/31; patient presents for 2-week follow-up. She has been using Iodosorb. She reports that the wound is closed. She denies signs of infection. Readmission: 10-27-2021 upon evaluation this is a patient that presents today whom I have previously seen this is pretty much for the same issue though I think a little bit higher than the last time I saw her. She does have a history of chronic venous insufficiency and hypertension along with varicose veins. Subsequently she does have an ulceration which spontaneously ruptured she has not been wearing any compression which I think is a big part of the issue here. We discussed this before I really think she probably needs to be wearing her compression therapy, she probably needs lymphedema pumps if she can ever wear the compression for a significant  amount of time to get these, and subsequently also think that she needs to be elevating her legs is much as possible she may even need some vascular intervention in regard to her veins. All of this was reiterated and discussed with her today to reinforce what needs to happen in order ATHELENE, HURSEY ([id number]) [id number] Page 3 of 9 to ensure that her legs do not get a lot worse. The patient voiced understanding. She tells me that she knows because she is seeing her mom go through a lot of this as well how bad things can get. 11-03-2021 upon evaluation today patient appears to be doing well with regard to her wound. Fortunately there does not appear to be any signs of active infection at this time. She is measuring a little bit bigger but I think this is because the wound is actually cleaning up a bit here. 7/27; left lateral leg wound not any smaller but perhaps with a cleaner surface. She is using Iodoflex to help with the latter and using Tubigrip. She has chronic venous insufficiency with secondary lymphedema. She is apparently followed by vein and vascular and is being scheduled for an ablation 11-17-2021 upon evaluation today patient appears to be doing well with regard to her wound this is actually showing signs of improvement which is great news. Fortunately I do not see any evidence of active infection locally or systemically at this time which is great news. No fevers, chills, nausea, vomiting, or diarrhea. 11-24-2021 upon evaluation today patient's wound is actually showing signs of significant improvement. Unfortunately she had quite a bit of pain with debridement last week I do believe it was beneficial but at the same time she is doing much better but still really does not want this debrided again I think being that it is looking a whole lot better I would try to avoid that today especially since it caused her so much discomfort that is really not the goal and I  explained that to the patient today she voiced understanding and knows that it needed to be done but still states that it was quite painful. 11-30-2021 upon evaluation today patient appears to be doing excellent in regard to her wound this is actually showing signs of excellent improvement I am very pleased with where things stand. She does have her venous ablation appointment for October 17. 12-21-2021 upon evaluation today patient appears to be doing better in regard to her wound this is measuring smaller and looking better as well. Fortunately I do not see any signs of active infection locally or systemically which is great news. 01-01-2022 upon evaluation today patient appears to be doing well currently in regard  to her wound. She is showing signs of improvement which is great news and overall I do not see any signs of active infection locally or systemically at this time. 01-08-2022 upon evaluation today patient appears to be doing well currently in regard to her wound she is actually showing signs of significant improvement which is great news. Fortunately I do not see any evidence of active infection locally or systemically at this time. I do believe that we are on the right track. She also has her appointment October 19 for the venous ablation. 01-16-2022 upon evaluation today patient's wound actually is showing some signs of improvement although this is very slow. Fortunately I do not see any evidence of infection at this time. The volume is a little bit more although the size is smaller I think this is due to the fact that we are slowly cleaning this area out effectively. 11/6 continued improvement the patient is using Iodoflex and a Tubigrip E. She was supposed to have venous surgery at vein and vascular in [place] however somehow this is gotten delayed till December 14. 02-27-2022 upon evaluation today patient's wound is actually showing signs of being completely healed. Fortunately I do not  see any evidence of active infection locally or systemically which is great news and overall I am extremely pleased with where we stand today. Unfortunately she does tell me that she postpone her venous ablation surgery until December 14 she tells me that she was not ready "financially" for this. Electronic Signature(s) Signed: 02/27/2022 5:11:21 PM By: Lenda Kelp PA-C Entered By: Lenda Kelp on 02/27/2022 17:11:21 -------------------------------------------------------------------------------- Physical Exam Details Patient Name: Date of Service: [name], BOEDER MA J. 02/27/2022 3:15 PM Medical Record Number: [id number] Patient Account Number: [id number] Date of Birth/Sex: Treating RN: [birth year] (36 y.o. [name] Primary Care Provider: [name] Other Clinician: Referring Provider: Treating Provider/Extender: Jaclynn Major in Treatment: 17 Constitutional Well-nourished and well-hydrated in no acute distress. Respiratory normal breathing without difficulty. Psychiatric this patient is able to make decisions and demonstrates good insight into disease process. Alert and Oriented x 3. pleasant and cooperative. Notes Upon inspection patient's wound bed actually showed signs of good granulation and epithelization at this point. Fortunately I do not see any evidence of infection at this time which is great news and overall I am extremely pleased with where we stand. Overall I think that she is healed and there is no signs of an open wound at this time she has not had any drainage for quite some time she tells me. [name], [name] ([id number]) [id number] Page 4 of 9 Electronic Signature(s) Signed: 02/27/2022 5:11:47 PM By: Lenda Kelp PA-C Entered By: Lenda Kelp on 02/27/2022 17:11:47 -------------------------------------------------------------------------------- Physician Orders Details Patient Name: Date of  Service: [name] Moody MA J. 02/27/2022 3:15 PM Medical Record Number: [id number] Patient Account Number: [id number] Date of Birth/Sex: Treating RN: [birth year] (36 y.o. [name] Primary Care Provider: [name] Other Clinician: Referring Provider: Treating Provider/Extender: Jaclynn Major in Treatment: 17 Verbal / Phone Orders: No Diagnosis Coding ICD-10 Coding Code Description I83.028 Varicose veins of left lower extremity with ulcer other part of lower leg I89.0 Lymphedema, not elsewhere classified L97.822 Non-pressure chronic ulcer of other part of left lower leg with fat layer exposed E66.01 Morbid (severe) obesity due to excess calories Discharge From [facility] Services Discharge from Wound Care Center Treatment Complete Wear compression garments daily. Put garments on first thing when you wake  up and remove them before bed. Follow-up Appointments Nurse Visit as needed Electronic Signature(s) Unsigned Entered By: Midge Aver on 02/27/2022 15:59:45 -------------------------------------------------------------------------------- Problem List Details Patient Name: Date of Service: [name], [name] [place] J. 02/27/2022 3:15 PM Medical Record Number: [id number] Patient Account Number: [id number] Date of Birth/Sex: Treating RN: Aug 29, 1985 (36 y.o. [name] Primary Care Provider: [name] Other Clinician: Referring Provider: Treating Provider/Extender: Jaclynn Major in Treatment: [address], [place] ([id number]) [id number] Page 5 of 9 Active Problems ICD-10 Encounter Code Description Active Date MDM Diagnosis I83.028 Varicose veins of left lower extremity with ulcer other part of lower leg 10/27/2021 No Yes I89.0 Lymphedema, not elsewhere classified 10/27/2021 No Yes L97.822 Non-pressure chronic ulcer of other part of left lower leg with fat layer exposed7/14/2023 No Yes E66.01 Morbid (severe)  obesity due to excess calories 10/27/2021 No Yes Inactive Problems Resolved Problems Electronic Signature(s) Unsigned Previous Signature: 02/27/2022 3:27:02 PM Version By: Lenda Kelp PA-C Entered By: Midge Aver on 02/27/2022 16:05:59 -------------------------------------------------------------------------------- Progress Note Details Patient Name: Date of Service: [name] Moody MA J. 02/27/2022 3:15 PM Medical Record Number: [id number] Patient Account Number: [id number] Date of Birth/Sex: Treating RN: 1985-08-29 (36 y.o. [name] Primary Care Provider: [name] Other Clinician: Referring Provider: Treating Provider/Extender: Jaclynn Major in Treatment: 17 Subjective Chief Complaint Information obtained from Patient Left LE Ulcer History of Present Illness (HPI) 36 year old patient who started with having ulcerations on the right lower leg on the lateral part of her ankle for about 2 weeks. She was seen in the ER at [facility] and advised to see the wound care for a consultation. No X-rays of workup was done during the ER visit and no prescription for any medications of compression wraps were given. the patient is not diabetic but does have hypertension and her medications have been reviewed by me. 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 The right and left greater saphenous vein was found to be tortuous. Deep venous system was also not competent and there was reflux of more than 500 ms. She was then seen by Dr. Karie Schwalbe Early who recommended that the patient would not benefit from endovenous ablation and he had recommended vein stripping odd on the right side and multiple small phlebectomy procedures on the left side. the patient did not follow-up due to social economic  reasons. She has not been wearing any compression stockings and has not taken any specific treatment for varicose veins for the last 3 years. 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 We have obtained some appointments to see the vascular surgeons by the end of the month and the patient would like to follow up with me at [address] [place] ([id number]) [id number] Page 6 of 9 clinic on Wednesday, June 29. 10/14/2014 -- she could not see me yesterday in [place] and hence has come for a review today. She has a vascular workup to be done this afternoon at [facility] She is doing fine otherwise. 10/22/2014 -- she was seen by Dr. Brantley Fling and he has recommended surgical removal of her right saphenous vein from distal thigh to saphenofemoral odd junction and stab phlebectomy's of multiple large tributary branches throughout  her thigh and calf. This would be done under general anesthesia in the outpatient setting. 10/29/2014 -- she is trying to work on a surgical date and in the meanwhile we have got insurance clearance for Apligraf and we will start this next week. 7/22 2016 -- she is here for the first application of Apligraf. 11/19/2014 -- she is here for a second application of Apligraf 11/26/2014 -- she has done fine after her last application of Apligraf and is awaiting her surgery which is scheduled for August 31. 12/03/2014 -- she is doing fine and is here for her third application of Apligraf. 12/21/2014 -- She had surgery on 12/15/2014 by Dr.Early who did #1 ligation and stripping of right great saphenous vein from distal thigh to saphenofemoral junction, #2 stab phlebectomy of large tributary varicose veins in the thigh popliteal space and calf. She had an Ace wrap up to her groin and this was removed today and the Unna's boot was also  removed. 12/28/2014 -- she is here for her fourth application of Apligraf. 01/06/2015 - he saw her vascular surgeon Dr. Arbie Cookey who was pleased with her progress and he has confirmed that no surgical procedures could be attempted on the left side. 01/13/2015 -- her wound looks very good and she's been having no problems whatsoever. Readmission: 07/26/2020 upon evaluation today patient presents for initial inspection here in our clinic for a new issue with her left leg although she is previously been seen due to issues with the right leg back in 2016. At that time she was seeing Dr. Arbie Cookey who is a vein/vascular specialist in [place]. He has since semiretired from what I understand. He is working out of [employer] I believe. Nonetheless she tells me at the time that there was really nothing to do for her left leg although the right leg was where they did most of the work. Subsequently she states that she is done fairly well until just in the past week where she had issues with bleeding from what appears to be varicose vein on the left leg medially. Unfortunately this has continued to be an issue although she tells me at first it was coming much more significantly Down quite a bit but nonetheless has not completely resolved. Every time she showers she notices that it starts to drain a little bit more. She does have a history of chronic venous insufficiency, lymphedema, varicose veins bilaterally, and obesity. 08/02/2020 upon evaluation today patient appears to be doing about the same in regards to the ulcer on her left leg. She has some eschar covering there is definitely some fluid collecting underneath unfortunately. With that being said she tells me she is still having a tremendous amount of pain therefore she is really not able to allow me to clean this off very effectively to be perfectly honest. I think we need to try to soften this up 08/16/2020 upon evaluation today patient's wound is really not  doing significantly better not really states about the same. She notes that the wrap just does not seem to be staying up very well at all unfortunately. No fevers, chills, nausea, vomiting, or diarrhea. She did cut it off once it starts to slide in order to alleviate some of the pressure from sliding Down. Fortunately there is no signs of active infection at this time which is great news. 08/23/2020 upon evaluation today patient appears to be doing well 08/23/2020 upon evaluation today patient appears to be doing well with regard to her wound all  things considered. Fortunately there does not appear to be any signs of active infection at this time which is great news. She has been tolerating the dressing changes without complication and overall I am extremely pleased with where things stand at this point. She does have her appointment with vascular in [facility] on June 9. 08/30/2020 upon evaluation today patient actually appears to be doing decently well in regard to her wound. Fortunately there is no signs of active infection which is great news. Nonetheless I do believe that the patient is going require little bit of debridement if she is okay with me attempting that today I think that will help clean off some of the necrotic tissue. Fortunately there does not appear to be otherwise any evidence of active infection which is also great news. 09/19/2020 upon evaluation today patient appears to be doing a little better in regard to her wound as compared to previous. Fortunately there does not appear to be any signs of active infection overall. No fever chills noted. I do believe that the Iodosorb has made this a little bit better with regard to the overall size and appearance of the wound bed though again she does still have quite a ways to go to get this to heal she still very tender to touch. 09/27/2020 upon evaluation today patient appears to actually be doing quite well with regard to her wound. This is  measuring much smaller which is great news. With that being said she did see vein and vascular in [facility] and they subsequently recommended that surgery is really what she probably needs to go forward with sounds like the potential for venous ablation. With that being said the patient tells me this is just not the right time for her to be able to proceed with any type of surgery which I completely understand. Nonetheless I do believe that she would continue to benefit from compression but again that is really not something that she is able to easily do. 10/04/2020 upon evaluation today patient appears to be doing about the same in regard to her wound. This is measuring a little bit smaller but nonetheless still is open and again has some slough and biofilm noted on the surface of the wound. There does not appear to be any signs of active infection which is great news. No fevers, chills, nausea, vomiting, or diarrhea. 10/04/2020 upon evaluation today patient appears to be doing well with regard to her wound. She has been tolerating dressing changes without complication. Fortunately there does not appear to be any signs of active infection which is great news. No fevers, chills, nausea, vomiting, or diarrhea. 10/25/2020 upon evaluation today patient appears to be doing well with regard to her wound. She has been tolerating the dressing changes without complication. Fortunately there is no signs of active infection at this time. No fevers, chills, nausea, vomiting, or diarrhea. 11/01/2020 upon evaluation today patient with regard to her wound. She has been tolerating the dressing changes without complication. Fortunately there does not appear to be any signs of infection which is great news. No fever chills noted 11/15/2020 upon evaluation today patient appears to be doing well with regard to her wound. Fortunately there is no signs of active infection at this time. No fevers, chills, nausea, vomiting, or  diarrhea. With that being said she continues to have a significant amount of pain at the site even though this is very close to complete closure. She also had several varicose veins around the area  which were also problematic. Overall however I feel like the patient is making excellent progress. 11/28/2020 upon evaluation today patient appears to be doing well with regard to her leg ulcer. Again were not really able to debride or compression wrap her due to discomfort and pain. She does not allow for that. With that being said we have been using Iodosorb which does seem to be doing decently well. Fortunately there is no signs of active infection at this time which is great news. No fevers, chills, nausea, vomiting, or diarrhea. 8/31; patient presents for 2-week follow-up. She has been using Iodosorb. She reports that the wound is closed. She denies signs of infection. Readmission: 10-27-2021 upon evaluation this is a patient that presents today whom I have previously seen this is pretty much for the same issue though I think a little bit higher than the last time I saw her. She does have a history of chronic venous insufficiency and hypertension along with varicose veins. Subsequently she does have an ulceration which spontaneously ruptured she has not been wearing any compression which I think is a big part of the issue here. We discussed this before I really think she probably needs to be wearing her compression therapy, she probably needs lymphedema pumps if she can ever wear the compression for a significant amount of time to get these, and subsequently also think that she needs to be elevating her legs is much as possible she may even need some vascular intervention in regard to her veins. All of this was reiterated and discussed with her today to reinforce what needs to happen in order [name], [name] ([id number]) [id number] Page 7 of 9 to ensure that her legs do not  get a lot worse. The patient voiced understanding. She tells me that she knows because she is seeing her mom go through a lot of this as well how bad things can get. 11-03-2021 upon evaluation today patient appears to be doing well with regard to her wound. Fortunately there does not appear to be any signs of active infection at this time. She is measuring a little bit bigger but I think this is because the wound is actually cleaning up a bit here. 7/27; left lateral leg wound not any smaller but perhaps with a cleaner surface. She is using Iodoflex to help with the latter and using Tubigrip. She has chronic venous insufficiency with secondary lymphedema. She is apparently followed by vein and vascular and is being scheduled for an ablation 11-17-2021 upon evaluation today patient appears to be doing well with regard to her wound this is actually showing signs of improvement which is great news. Fortunately I do not see any evidence of active infection locally or systemically at this time which is great news. No fevers, chills, nausea, vomiting, or diarrhea. 11-24-2021 upon evaluation today patient's wound is actually showing signs of significant improvement. Unfortunately she had quite a bit of pain with debridement last week I do believe it was beneficial but at the same time she is doing much better but still really does not want this debrided again I think being that it is looking a whole lot better I would try to avoid that today especially since it caused her so much discomfort that is really not the goal and I explained that to the patient today she voiced understanding and knows that it needed to be done but still states that it was quite painful. 11-30-2021 upon evaluation today patient appears to be doing  excellent in regard to her wound this is actually showing signs of excellent improvement I am very pleased with where things stand. She does have her venous ablation appointment for October  17. 12-21-2021 upon evaluation today patient appears to be doing better in regard to her wound this is measuring smaller and looking better as well. Fortunately I do not see any signs of active infection locally or systemically which is great news. 01-01-2022 upon evaluation today patient appears to be doing well currently in regard to her wound. She is showing signs of improvement which is great news and overall I do not see any signs of active infection locally or systemically at this time. 01-08-2022 upon evaluation today patient appears to be doing well currently in regard to her wound she is actually showing signs of significant improvement which is great news. Fortunately I do not see any evidence of active infection locally or systemically at this time. I do believe that we are on the right track. She also has her appointment October 19 for the venous ablation. 01-16-2022 upon evaluation today patient's wound actually is showing some signs of improvement although this is very slow. Fortunately I do not see any evidence of infection at this time. The volume is a little bit more although the size is smaller I think this is due to the fact that we are slowly cleaning this area out effectively. 11/6 continued improvement the patient is using Iodoflex and a Tubigrip E. She was supposed to have venous surgery at vein and vascular in [place] however somehow this is gotten delayed till December 14. 02-27-2022 upon evaluation today patient's wound is actually showing signs of being completely healed. Fortunately I do not see any evidence of active infection locally or systemically which is great news and overall I am extremely pleased with where we stand today. Unfortunately she does tell me that she postpone her venous ablation surgery until December 14 she tells me that she was not ready "financially" for this. Objective Constitutional Well-nourished and well-hydrated in no acute distress. Vitals  Time Taken: 3:42 PM, Height: 66 in, Weight: 400 lbs, BMI: 64.6, Temperature: 99.3 F, Pulse: 90 bpm, Respiratory Rate: 16 breaths/min, Blood Pressure: 135/86 mmHg. Respiratory normal breathing without difficulty. Psychiatric this patient is able to make decisions and demonstrates good insight into disease process. Alert and Oriented x 3. pleasant and cooperative. General Notes: Upon inspection patient's wound bed actually showed signs of good granulation and epithelization at this point. Fortunately I do not see any evidence of infection at this time which is great news and overall I am extremely pleased with where we stand. Overall I think that she is healed and there is no signs of an open wound at this time she has not had any drainage for quite some time she tells me. Integumentary (Hair, Skin) Wound #4 status is Healed - Epithelialized. Original cause of wound was Gradually Appeared. The date acquired was: 10/17/2021. The wound has been in treatment 17 weeks. The wound is located on the Left,Medial Lower Leg. The wound measures 0cm length x 0cm width x 0cm depth; 0cm^2 area and 0cm^3 volume. There is Fat Layer (Subcutaneous Tissue) exposed. There is no tunneling or undermining noted. There is a medium amount of serosanguineous drainage noted. There is medium (34-66%) pink, pale granulation within the wound bed. There is a medium (34-66%) amount of necrotic tissue within the wound bed. Assessment Active Problems ICD-10 Varicose veins of left lower extremity with ulcer other part  of lower leg Lymphedema, not elsewhere classified Non-pressure chronic ulcer of other part of left lower leg with fat layer exposed Morbid (severe) obesity due to excess calories [name], [name] ([id number]) [id number] Page 8 of 9 Plan Discharge From [facility] Services: Discharge from Wound Care Center Treatment Complete Wear compression garments daily. Put garments on first thing when you  wake up and remove them before bed. Follow-up Appointments: Nurse Visit as needed 1. I am going to recommend currently based on what I am seeing that we have the patient go ahead and continue with the wound care measures as before and she is in agreement with the plan this would just be a protective dressing though at this point. Were not can be using any medications currently. 2. I am also can recommend that we have the patient continue with the Tubigrip or compression socks at home. I think she needs to be wearing this every day. 3. I would also recommend she continue to elevate her legs much as possible to help with edema control. We will see patient back for reevaluation in 1 week here in the clinic. If anything worsens or changes patient will contact our office for additional recommendations. Electronic Signature(s) Signed: 02/27/2022 5:12:39 PM By: Lenda Kelp PA-C Entered By: Lenda Kelp on 02/27/2022 17:12:39 -------------------------------------------------------------------------------- SuperBill Details Patient Name: Date of Service: [name] Moody MA J. 02/27/2022 Medical Record Number: [id number] Patient Account Number: [id number] Date of Birth/Sex: Treating RN: [birth year] (36 y.o. [name] Primary Care Provider: [name] Other Clinician: Referring Provider: Treating Provider/Extender: Jaclynn Major in Treatment: 17 Diagnosis Coding ICD-10 Codes Code Description I83.028 Varicose veins of left lower extremity with ulcer other part of lower leg I89.0 Lymphedema, not elsewhere classified L97.822 Non-pressure chronic ulcer of other part of left lower leg with fat layer exposed E66.01 Morbid (severe) obesity due to excess calories Facility Procedures : CPT4 Code: [id number] Description: 99213 - WOUND CARE VISIT-LEV 3 EST PT Modifier: Quantity: 1 Physician Procedures : CPT4 Code Description Modifier [id number] - WC PHYS LEVEL 3 -  EST PT ICD-10 Diagnosis Description I83.028 Varicose veins of left lower extremity with ulcer other part of lower leg I89.0 Lymphedema, not elsewhere classified L97.822 Non-pressure  chronic ulcer of other part of left lower leg with fat layer exposed E66.01 Morbid (severe) obesity due to excess calories [name], [name] ([id number]) [id number] Page 9 of Quantity: 1 9 Electronic Signature(s) Signed: 02/27/2022 5:13:17 PM By: Lenda Kelp PA-C Entered By: Lenda Kelp on 02/27/2022 17:13:17

## 2022-02-27 NOTE — Progress Notes (Signed)
[name], [name] ([id number]) [id number] Page 1 of 9 Visit Report for 02/27/2022 Arrival Information Details Patient Name: Date of Service: [name], [name] [place]. 02/27/2022 3:15 PM Medical Record Number: [id number] Patient Account Number: [id number] Date of Birth/Sex: Treating RN: 06-24-1985 (37 y.o. [name] Primary Care [name]: [name] Other Clinician: Referring [name]: Treating [name]/Extender: [name] in Treatment: 17 Visit Information History Since Last Visit Added or deleted any medications: No Patient Arrived: Ambulatory Any new allergies or adverse reactions: No Arrival Time: 15:38 Had a fall or experienced change in No Accompanied By: self activities of daily living that may affect Transfer Assistance: None risk of falls: Patient Identification Verified: Yes Hospitalized since last visit: No Secondary Verification Process Completed: Yes Pain Present Now: No Patient Requires Transmission-Based Precautions: No Patient Has Alerts: No Electronic Signature(s) Signed: 02/27/2022 3:52:31 PM By: [name] Aver MSN RN CNS WTA Entered By: [name] on 02/27/2022 15:52:31 -------------------------------------------------------------------------------- Clinic Level of Care Assessment Details Patient Name: Date of Service: [name], [name] [name]. 02/27/2022 3:15 PM Medical Record Number: [id number] Patient Account Number: [id number] Date of Birth/Sex: Treating RN: Mar 28, 1986 (36 y.o. [name] Primary Care [name]: [name] Other Clinician: Referring Denis [name]: Treating [name]/Extender: [name] in Treatment: 17 Clinic Level of Care Assessment Items TOOL 4 Quantity Score X- 1 0 Use when only an EandM is performed on FOLLOW-UP visit ASSESSMENTS - Nursing Assessment / Reassessment X- 1 10 Reassessment of Co-morbidities (includes updates in patient status) X- 1  5 Reassessment of Adherence to Treatment Plan ASSESSMENTS - Wound and Skin A ssessment / Reassessment X - Simple Wound Assessment / Reassessment - one wound 1 5 []  - 0 Complex Wound Assessment / Reassessment - multiple wounds [name], [name] ([name] Simmonds) [id number] Page 2 of 9 []  - 0 Dermatologic / Skin Assessment (not related to wound area) ASSESSMENTS - Focused Assessment []  - 0 Circumferential Edema Measurements - multi extremities []  - 0 Nutritional Assessment / Counseling / Intervention []  - 0 Lower Extremity Assessment (monofilament, tuning fork, pulses) []  - 0 Peripheral Arterial Disease Assessment (using hand held doppler) ASSESSMENTS - Ostomy and/or Continence Assessment and Care []  - 0 Incontinence Assessment and Management []  - 0 Ostomy Care Assessment and Management (repouching, etc.) PROCESS - Coordination of Care X - Simple Patient / Family Education for ongoing care 1 15 []  - 0 Complex (extensive) Patient / Family Education for ongoing care X- 1 10 Staff obtains 02-13-1989, Records, T Results / Process Orders est []  - 0 Staff telephones HHA, Nursing Homes / Clarify orders / etc []  - 0 Routine Transfer to another Facility (non-emergent condition) []  - 0 Routine Hospital Admission (non-emergent condition) []  - 0 New Admissions / / Ordering NPWT Apligraf, etc. , []  - 0 Emergency Hospital Admission (emergent condition) X- 1 10 Simple Discharge Coordination []  - 0 Complex (extensive) Discharge Coordination PROCESS - Special Needs []  - 0 Pediatric / Minor Patient Management []  - 0 Isolation Patient Management []  - 0 Hearing / Language / Visual special needs []  - 0 Assessment of Community assistance (transportation, D/C planning, etc.) []  - 0 Additional assistance / Altered mentation []  - 0 Support Surface(s) Assessment (bed, cushion, seat, etc.) INTERVENTIONS - Wound Cleansing / Measurement X -  Simple Wound Cleansing - one wound 1 5 []  - 0 Complex Wound Cleansing - multiple wounds X- 1 5 Wound Imaging (photographs - any number of wounds) []  - 0 Wound Tracing (instead of photographs)  X- 1 5 Simple Wound Measurement - one wound []  - 0 Complex Wound Measurement - multiple wounds INTERVENTIONS - Wound Dressings X - Small Wound Dressing one or multiple wounds 1 10 []  - 0 Medium Wound Dressing one or multiple wounds []  - 0 Large Wound Dressing one or multiple wounds []  - 0 Application of Medications - topical []  - 0 Application of Medications - injection INTERVENTIONS - Miscellaneous []  - 0 External ear exam []  - 0 Specimen Collection (cultures, biopsies, blood, body fluids, etc.) []  - 0 Specimen(s) / Culture(s) sent or taken to Lab for analysis [name], [name] ([id number]) [id number] Page 3 of 9 []  - 0 Patient Transfer (multiple staff / Nurse, adultHoyer Lift / Similar devices) []  - 0 Simple Staple / Suture removal (25 or less) []  - 0 Complex Staple / Suture removal (26 or more) []  - 0 Hypo / Hyperglycemic Management (close monitor of Blood Glucose) []  - 0 Ankle / Brachial Index (ABI) - do not check if billed separately X- 1 5 Vital Signs Has the patient been seen at the hospital within the last three years: Yes Total Score: 85 Level Of Care: New/Established - Level 3 Electronic Signature(s) Unsigned Entered By: [name] AverSmith, Vicki on 02/27/2022 16:00:34 -------------------------------------------------------------------------------- Encounter Discharge Information Details Patient Name: Date of Service: [name], [name] [name]. 02/27/2022 3:15 PM Medical Record Number: [id number] Patient Account Number: [id number] Date of Birth/Sex: Treating RN: 07/16/1985 (36 y.o. [name] PitmanF) Smith, Vicki Primary Care Davaughn Hillyard: [name] MansQuillen, [name] Other Clinician: Referring Tekla Malachowski: Treating [name]/Extender: [name] MajorStone, [name], [name] Weeks in Treatment: 17 Encounter  Discharge Information Items Discharge Condition: Stable Ambulatory Status: Ambulatory Discharge Destination: Home Transportation: Private Auto Accompanied By: self Schedule Follow-up Appointment: No Clinical Summary of Care: Electronic Signature(s) Unsigned Entered By: [name] AverSmith, Vicki on 02/27/2022 16:06:49 -------------------------------------------------------------------------------- Lower Extremity Assessment Details Patient Name: Date of Service: [name], SHA [place] [name]. 02/27/2022 3:15 PM [name], [name] ([id number]) [id number] Page 4 of 9 Medical Record Number: [id number] Patient Account Number: [id number] Date of Birth/Sex: Treating RN: 07/22/1985 (36 y.o. [name] PitmanF) Smith, Vicki Primary Care Yaniah Thiemann: [name] MansQuillen, [name] Other Clinician: Referring Keymiah Lyles: Treating Calbert Hulsebus/Extender: [name] MajorStone, [name], [name] Weeks in Treatment: 17 Edema Assessment Assessed: [name][Left: No] [name][Right: No] [Left: Edema] [Right: :] Calf Left: Right: Point of Measurement: 31 cm From Medial Instep 60 cm Ankle Left: Right: Point of Measurement: 10 cm From Medial Instep 33.5 cm Vascular Assessment Pulses: Dorsalis Pedis Palpable: [Left:Yes] Electronic Signature(s) Unsigned Entered By: [name] AverSmith, Vicki on 02/27/2022 15:58:26 -------------------------------------------------------------------------------- Multi Wound Chart Details Patient Name: Date of Service: [name], [name] [place] [name]. 02/27/2022 3:15 PM Medical Record Number: [id number] Patient Account Number: [id number] Date of Birth/Sex: Treating RN: 05/12/1985 (36 y.o. [name] PitmanF) Smith, Vicki Primary Care Milayah Krell: [name] MansQuillen, [name] Other Clinician: Referring Guila Owensby: Treating Cheril Slattery/Extender: [name] MajorStone, [name], [name] Weeks in Treatment: 17 Vital Signs Height(in): 66 Pulse(bpm): 90 Weight(lbs): 400 Blood Pressure(mmHg): 135/86 Body Mass Index(BMI): 64.6 Temperature(F): 99.3 Respiratory Rate(breaths/min): 16 [4:Photos:]  [N/A:N/A] Left, Medial Lower Leg N/A N/A Wound Location: [name], [name] ([id number]) [id number] Page 5 of 9 Gradually Appeared N/A N/A Wounding Event: Venous Leg Ulcer N/A N/A Primary Etiology: Hypertension, Peripheral Venous N/A N/A Comorbid History: Disease 10/17/2021 N/A N/A Date Acquired: 517 N/A N/A Weeks of Treatment: Healed - Epithelialized N/A N/A Wound Status: No N/A N/A Wound Recurrence: 0x0x0 N/A N/A Measurements L x W x D (cm) 0 N/A N/A A (cm) : rea 0 N/A N/A Volume (cm) : 100.00% N/A N/A % Reduction in Area: 100.00% N/A N/A % Reduction in Volume: Full  Thickness Without Exposed N/A N/A Classification: Support Structures Medium N/A N/A Exudate Amount: Serosanguineous N/A N/A Exudate Type: red, brown N/A N/A Exudate Color: Medium (34-66%) N/A N/A Granulation Amount: Pink, Pale N/A N/A Granulation Quality: Medium (34-66%) N/A N/A Necrotic Amount: Fat Layer (Subcutaneous Tissue): Yes N/A N/A Exposed Structures: Fascia: No Tendon: No Muscle: No Joint: No Bone: No Medium (34-66%) N/A N/A Epithelialization: Treatment Notes Electronic Signature(s) Unsigned Entered By: [name] on 02/27/2022 15:58:53 -------------------------------------------------------------------------------- Multi-Disciplinary Care Plan Details Patient Name: Date of Service: [name], [name] [name]. 02/27/2022 3:15 PM Medical Record Number: [id number] Patient Account Number: [id number] Date of Birth/Sex: Treating RN: 01-16-1986 (36 y.o. [name] Primary Care Dakari Stabler: [name] Other Clinician: Referring Astha Probasco: Treating Alashia Brownfield/Extender: [name] in Treatment: 17 Active Inactive Electronic Signature(s) Unsigned Entered By: [name] on 02/27/2022 15:58:43 Signature(s): NANDITA, MATHENIA ([id number]) [id number] Date(s): [id number] Page 6 of  9 -------------------------------------------------------------------------------- Pain Assessment Details Patient Name: Date of Service: [name], [name] [place]. 02/27/2022 3:15 PM Medical Record Number: [id number] Patient Account Number: [id number] Date of Birth/Sex: Treating RN: 1985/04/25 (36 y.o. [name] Primary Care Makenley Shimp: [name] Other Clinician: Referring Elizabethann Lackey: Treating Ashutosh Dieguez/Extender: [name] in Treatment: 17 Active Problems Location of Pain Severity and Description of Pain Patient Has Paino Yes Site Locations Pain Location: Pain in Ulcers With Dressing Change: Yes Duration of the Pain. Constant / Intermittento Constant Rate the pain. Current Pain Level: 3 Worst Pain Level: 9 Least Pain Level: 1 Tolerable Pain Level: 3 Character of Pain Describe the Pain: Aching, Shooting, Throbbing Pain Management and Medication Current Pain Management: Electronic Signature(s) Unsigned Entered By: [name] on 02/27/2022 15:43:49 -------------------------------------------------------------------------------- Patient/Caregiver Education Details Patient Name: Date of Service: [name] [place] [name]. 11/14/2023andnbsp3:15 PM Medical Record Number: [id number] Patient Account Number: [id number] [name], SHVARTSMAN ([id number]) [id number] Page 7 of 9 Date of Birth/Gender: Treating RN: Sep 22, 1985 (36 y.o. [name] Primary Care Physician: [name] Other Clinician: Referring Physician: Treating Physician/Extender: [name] in Treatment: 17 Education Assessment Education Provided To: Patient Education Topics Provided Wound/Skin Impairment: Handouts: Caring for Your Ulcer Methods: Explain/Verbal Responses: State content correctly Electronic Signature(s) Unsigned Entered By: [name] on 02/27/2022  16:01:00 -------------------------------------------------------------------------------- Wound Assessment Details Patient Name: Date of Service: AARYN, PARRILLA [place] 02/27/2022 3:15 PM Medical Record Number: [id number] Patient Account Number: [id number] Date of Birth/Sex: Treating RN: 1985-05-22 (37 y.o. [name] Primary Care Saraia Platner: [name] Other Clinician: Referring Arrion Broaddus: Treating Josaiah Muhammed/Extender: [name] in Treatment: 17 Wound Status Wound Number: 4 Primary Etiology: Venous Leg Ulcer Wound Location: Left, Medial Lower Leg Wound Status: Healed - Epithelialized Wounding Event: Gradually Appeared Comorbid History: Hypertension, Peripheral Venous Disease Date Acquired: 10/17/2021 Weeks Of Treatment: 17 Clustered Wound: No Photos Wound Measurements Length: (cm) Width: (cm) Depth: (cm) Area: (cm) CLYDE, UPSHAW ([id number]) Volume: (cm) 0 % Reduction in Area: 100% 0 % Reduction in Volume: 100% 0 Epithelialization: Medium (34-66%) 0 Tunneling: No [id number] Page 8 of 9 0 Undermining: No Wound Description Classification: Full Thickness Without Exposed Support Structures Exudate Amount: Medium Exudate Type: Serosanguineous Exudate Color: red, brown Foul Odor After Cleansing: No Slough/Fibrino Yes Wound Bed Granulation Amount: Medium (34-66%) Exposed Structure Granulation Quality: Pink, Pale Fascia Exposed: No Necrotic Amount: Medium (34-66%) Fat Layer (Subcutaneous Tissue) Exposed: Yes Tendon Exposed: No Muscle Exposed: No Joint Exposed: No Bone Exposed: No Treatment Notes Wound #4 (Lower Leg) Wound Laterality: Left, Medial Cleanser Peri-Wound Care  Topical Primary Dressing Secondary Dressing Secured With Compression Wrap Compression Stockings Add-Ons Electronic Signature(s) Unsigned Entered By: [name] on 02/27/2022  15:58:19 -------------------------------------------------------------------------------- Vitals Details Patient Name: Date of Service: RHILEY, TARVER [place] [name]. 02/27/2022 3:15 PM Medical Record Number: [id number] Patient Account Number: [id number] Date of Birth/Sex: Treating RN: [birth year] (36 y.o. [name] Primary Care Carlina Derks: [name] Other Clinician: Referring Markie Heffernan: Treating Cuca Benassi/Extender: [name] in Treatment: 17 Vital Signs Time Taken: 15:42 Temperature (F): 99.3 Height (in): 66 Pulse (bpm): 90 Weight (lbs): 400 Respiratory Rate (breaths/min): 16 Body Mass Index (BMI): 64.6 Blood Pressure (mmHg): 135/86 Reference Range: 80 - 120 mg / dl Electronic Signature(s) SHARIAH, ASSAD ([id number]) [id number] Page 9 of 9 Unsigned Entered By: [name] on 02/27/2022 15:42:51 Signature(s): Date(s):

## 2022-03-27 NOTE — Telephone Encounter (Signed)
Patient contacted office requesting to postpone procedure. States she needs to have the surgery but now is not a good time financially. Patient states she will contact office back when ready to schedule, hopefully within the next few months. Dr. Myra Gianotti made aware.

## 2022-03-29 ENCOUNTER — Encounter (HOSPITAL_COMMUNITY): Admission: RE | Payer: Self-pay | Source: Home / Self Care

## 2022-03-29 ENCOUNTER — Ambulatory Visit (HOSPITAL_COMMUNITY): Admission: RE | Admit: 2022-03-29 | Payer: BC Managed Care – PPO | Source: Home / Self Care | Admitting: Surgery

## 2022-03-29 SURGERY — LIGATION AND STRIPPING, VARICOSE VEIN
Anesthesia: Choice | Laterality: Left

## 2022-03-30 ENCOUNTER — Ambulatory Visit: Payer: BC Managed Care – PPO | Admitting: Student

## 2022-03-30 NOTE — Progress Notes (Deleted)
  SUBJECTIVE:   CHIEF COMPLAINT / HPI:   Muscle spasms/cramps   PERTINENT  PMH / PSH: ***  Past Medical History:  Diagnosis Date   Ankle ulcer (HCC)    right   Childhood asthma    Chronic lower back pain    GERD (gastroesophageal reflux disease)    Headache    "only when I'm hungry" (10/14/2015)   Hypertension    Obesity    Varicose veins    VSD (ventricular septal defect)    "had hole in bottom of her heart; closed up on it's own; no OR" /mom 10/14/2015)    OBJECTIVE:  There were no vitals taken for this visit. Physical Exam   ASSESSMENT/PLAN:  There are no diagnoses linked to this encounter. No follow-ups on file. Bess Kinds, MD 03/30/2022, 8:05 AM PGY-***, [facility] {    This will disappear when note is signed, click to select method of visit    :1}

## 2022-03-30 NOTE — Patient Instructions (Incomplete)
It was great to see you! Thank you for allowing me to participate in your care!  I recommend that you always bring your medications to each appointment as this makes it easy to ensure we are on the correct medications and helps us not miss when refills are needed.  Our plans for today:  - *** -   We are checking some labs today, I will call you if they are abnormal will send you a MyChart message or a letter if they are normal.  If you do not hear about your labs in the next 2 weeks please let us know.***  Take care and seek immediate care sooner if you develop any concerns.   Dr. Jerick Khachatryan, MD [facility]  

## 2022-04-02 ENCOUNTER — Encounter: Payer: Self-pay | Admitting: Student

## 2022-04-02 ENCOUNTER — Ambulatory Visit (INDEPENDENT_AMBULATORY_CARE_PROVIDER_SITE_OTHER): Payer: BC Managed Care – PPO | Admitting: Student

## 2022-04-02 VITALS — BP 125/70 | HR 113 | Temp 98.1°F | Ht 66.0 in | Wt >= 6400 oz

## 2022-04-02 DIAGNOSIS — R252 Cramp and spasm: Secondary | ICD-10-CM | POA: Diagnosis not present

## 2022-04-02 NOTE — Progress Notes (Signed)
  SUBJECTIVE:   CHIEF COMPLAINT / HPI:   Cramping and muscle spasms Seen 12/21 for cramping and muscle spasms, given flexeril and had normal BMP/Mg  Has been cramping bad to the point of tears. Happens if she makes a wrong move/bends over. Has been an issue for years, prior to being seen in 2021. Cramps/musle spasms are happening everywhere. Had shingles two weeks ago. Cramps happen at rest and with activity. Cramping last 10-30 min at a time. Patient attempts to massage the muscle with no relief.  PERTINENT  PMH / PSH:    OBJECTIVE:  BP 125/70   Pulse (!) 113   Temp 98.1 F (36.7 C)   Ht 5\' 6"  (1.676 m)   Wt (!) 407 lb 3.2 oz (184.7 kg)   SpO2 99%   BMI 65.72 kg/m  Physical Exam Musculoskeletal:     Right upper arm: Normal.     Left upper arm: Normal.     Right forearm: Normal.     Left forearm: Normal.     Right upper leg: Normal.     Left upper leg: Normal.     Right lower leg: Normal.     Left lower leg: Normal.      ASSESSMENT/PLAN:  Muscle cramps Assessment & Plan: Patient repots she's had issues with cramps/muscle spasms for years. Patient notes cramping happens sporadically, and can las 10-30 min a time. Patient seen for this issues in 03/2020 with normal BMP/Mg. Etiology of cramping unknown at this time, patient could have electrolyte disturbance causing cramps, will check BMP/Mg today. Reassurance provided and patient encouraged to keep journal of cramps. Patient otherwise feeling normal today, with no cramps at this time.  -BMP, MG -Journal of events/lack of events -Try massage/stretching, tonic water, bananas when cramping, hot/cold tx for spasms  Orders: -     BASIC METABOLIC PANEL WITH GFR; Future -     Magnesium; Future   No follow-ups on file. 04/2020, MD 04/02/2022, 5:10 PM PGY-2, [facility]

## 2022-04-02 NOTE — Assessment & Plan Note (Addendum)
Patient repots she's had issues with cramps/muscle spasms for years. Patient notes cramping happens sporadically, and can las 10-30 min a time. Patient seen for this issues in 03/2020 with normal BMP/Mg. Etiology of cramping unknown at this time, patient could have electrolyte disturbance causing cramps, will check BMP/Mg today. Reassurance provided and patient encouraged to keep journal of cramps. Patient otherwise feeling normal today, with no cramps at this time.  -BMP, MG -Journal of events/lack of events -Try massage/stretching, tonic water, bananas when cramping, hot/cold tx for spasms

## 2022-04-02 NOTE — Patient Instructions (Addendum)
It was great to see you! Thank you for allowing me to participate in your care!  I'm sorry to hear about the cramping you have been dealing with. We will check some labs to see if this is a cause.  Our plans for today:  - Cramp management  Try drinking tonic water to see if this decreases cramps  Try eating bananas when cramping occurs to see if helps relieve cramps  Try soaking muscles in epsom salt bath  Stretch and massage muscles regularly  Apply heat/cold to warm/tense muscles  Keep journal of cramps: When they are happening, how long they last, what they feel like, where they were located, what you were doing when cramp started, also documented if you have days with no cramps   We are checking some labs today, I will call you if they are abnormal will send you a MyChart message or a letter if they are normal.  If you do not hear about your labs in the next 2 weeks please let us know.  Take care and seek immediate care sooner if you develop any concerns.   Dr. Bess Kinds, MD [facility]

## 2022-04-12 ENCOUNTER — Encounter: Payer: Self-pay | Admitting: Student

## 2022-04-12 ENCOUNTER — Telehealth: Payer: Self-pay

## 2022-04-12 ENCOUNTER — Other Ambulatory Visit: Payer: Self-pay

## 2022-04-12 ENCOUNTER — Ambulatory Visit (INDEPENDENT_AMBULATORY_CARE_PROVIDER_SITE_OTHER): Payer: BC Managed Care – PPO | Admitting: Student

## 2022-04-12 VITALS — BP 113/75 | HR 104 | Wt >= 6400 oz

## 2022-04-12 DIAGNOSIS — N926 Irregular menstruation, unspecified: Secondary | ICD-10-CM | POA: Diagnosis not present

## 2022-04-12 LAB — POCT URINE PREGNANCY: Preg Test, Ur: NEGATIVE

## 2022-04-12 LAB — POCT HEMOGLOBIN: Hemoglobin: 13 g/dL (ref 11–14.6)

## 2022-04-12 MED ORDER — MEDROXYPROGESTERONE ACETATE 10 MG PO TABS: 10.0000 mg | ORAL_TABLET | Freq: Every day | ORAL | 0 refills | Status: DC

## 2022-04-12 NOTE — Telephone Encounter (Signed)
Patient calls nurse line regarding concerns for irregular/heavy vaginal bleeding.   She reports that she has a history of irregular periods that are usually light. She had a light period on 12/16 and then started having heavy bleeding on 12/25.  She reports associated abdominal pain, small blood clots and is changing pad every couple hours. Prior to December period, last period was at the end of September. Patient is sexually active and is not using contraception.    She denies lightheadedness or dizziness. Current pain level is 5/10. She states that pain level has improved over the last day.   Spoke with Dr. Miquel Dunn regarding patient. Advised that patient should be evaluated today with recommendation of obtaining pregnancy test and POC hemoglobin. Scheduled patient with Dr. Laroy Apple this afternoon.   ED precautions discussed in the meantime.   Veronda Prude, RN

## 2022-04-12 NOTE — Progress Notes (Signed)
    SUBJECTIVE:   CHIEF COMPLAINT / HPI:   Patient called nurse line earlier today:   "She reports that she has a history of irregular periods that are usually light. She had a light period on 12/16 and then started having heavy bleeding on 12/25.  She reports associated abdominal pain, small blood clots and is changing pad every couple hours. Prior to December period, last period was at the end of September. Patient is sexually active and is not using contraception.    She denies lightheadedness or dizziness. Current pain level is 5/10. She states that pain level has improved over the last day."  In office patient relays that her periods are irregular always. Had one back in August, 1 in September skipped October and November and then this month the week of the 4th had a light menstruation and the week of the 11th was bleeding for 4 days. On christmas or day before has been very heavy 3 days of pads soaked through that she has to change fairly often and still bleeding right now. Changes pad when she goes to bathroom every few hours. Soaks through.  Has to lay towel in bed and car seat due to the heavy menstruation.  Has happened to her before and she has had a couple months that she has not bled for. No abdominal pain currently. Stomach initially hurt but none now. Kinda dizzy the other day and this morning but none right now.  She feels like the bleeding is starting to slow down today.  Feeling very stressed due to needing leg surgery-worried about this.   Last pelvic ultrasound was in 2007 which did not show any issues  I updated patient's medications and chart she says that she was recently on prednisone and an antibiotic for ?shingles recently at the end of November into early December. Uses robaxin 500 mg-for muscle cramps-every day around 2-4 times a day  PERTINENT  PMH / PSH:   OBJECTIVE:   BP 113/75   Pulse (!) 104   Wt (!) 409 lb 3.2 oz (185.6 kg)   SpO2 100%   BMI 66.05 kg/m    General:NAD, awake, alert, responsive to questions Head: Normocephalic atraumatic CV: Regular rate and rhythm no murmurs rubs or gallops Respiratory: Clear to ausculation bilaterally, no wheezes rales or crackles, chest rises symmetrically,  no increased work of breathing Abdomen: Soft, non-tender to palpation throughout, no CVA tenderness, normoactive bowel sounds  Extremities: Moves upper and lower extremities freely ASSESSMENT/PLAN:   Irregular periods Patient has had abnormal uterine bleeding for many years now.  She has been having very heavy bleeding for the past 3 days.  Point-of-care hemoglobin today was appropriate at 13.  Her urine pregnancy test was negative in office today.  I discussed with her that she may need to start taking Provera if her bleeding does not continue to slow down as it has today.  I am reassured by her hemoglobin and negative urine pregnancy test.  Will obtain ferritin to see whether patient needs iron supplementation.  I also discussed that it has been a while since she has gotten a pelvic ultrasound and that we can get this to rule out a fibroid causing this bleeding. -Ferritin -Provera 10 mg for 10 days if bleeding continues -Complete pelvic ultrasound   Levin Erp, MD [facility]

## 2022-04-12 NOTE — Patient Instructions (Addendum)
It was great to see you! Thank you for allowing me to participate in your care!   Our plans for today:  -Your hemoglobin today in office was good and within normal limits, I can send you in a prescription for Provera to take if your bleeding gets out of control or you take it for 10 days straight and then you will get a withdrawal period after this that should be your normal period -Your pregnancy test was negative -Please return to care if you start having worsening lightheadedness, worsening bleeding that is not controlled -we will get a ferritin level on you to see whether you need iron tablets -I am ordering a pelvic ultrasound for you to make sure you don't have fibroids  Take care and seek immediate care sooner if you develop any concerns.  Levin Erp, MD

## 2022-04-12 NOTE — Assessment & Plan Note (Signed)
Patient has had abnormal uterine bleeding for many years now.  She has been having very heavy bleeding for the past 3 days.  Point-of-care hemoglobin today was appropriate at 13.  Her urine pregnancy test was negative in office today.  I discussed with her that she may need to start taking Provera if her bleeding does not continue to slow down as it has today.  I am reassured by her hemoglobin and negative urine pregnancy test.  Will obtain ferritin to see whether patient needs iron supplementation.  I also discussed that it has been a while since she has gotten a pelvic ultrasound and that we can get this to rule out a fibroid causing this bleeding. -Ferritin -Provera 10 mg for 10 days if bleeding continues -Complete pelvic ultrasound

## 2022-04-13 LAB — FERRITIN: Ferritin: 140 ng/mL (ref 15–150)

## 2022-04-18 ENCOUNTER — Encounter: Payer: BC Managed Care – PPO | Attending: Physician Assistant | Admitting: Internal Medicine

## 2022-04-18 DIAGNOSIS — I87312 Chronic venous hypertension (idiopathic) with ulcer of left lower extremity: Secondary | ICD-10-CM | POA: Diagnosis not present

## 2022-04-18 DIAGNOSIS — Z6841 Body Mass Index (BMI) 40.0 and over, adult: Secondary | ICD-10-CM | POA: Diagnosis not present

## 2022-04-18 DIAGNOSIS — L97822 Non-pressure chronic ulcer of other part of left lower leg with fat layer exposed: Secondary | ICD-10-CM

## 2022-04-18 DIAGNOSIS — I872 Venous insufficiency (chronic) (peripheral): Secondary | ICD-10-CM | POA: Insufficient documentation

## 2022-04-18 DIAGNOSIS — I890 Lymphedema, not elsewhere classified: Secondary | ICD-10-CM | POA: Diagnosis not present

## 2022-04-18 DIAGNOSIS — E669 Obesity, unspecified: Secondary | ICD-10-CM | POA: Diagnosis not present

## 2022-04-20 NOTE — Progress Notes (Signed)
[name], [name] ([id number]) [id number] Page 1 of 11 Visit Report for 04/18/2022 Chief Complaint Document Details Patient Name: Date of Service: [name], [name] [name]. 04/18/2022 1:30 PM Medical Record Number: [id number] Patient Account Number: [id number] Date of Birth/Sex: Treating RN: [name][birth year] (37 y.o. [name] Primary Care Provider: Celine [name] Other Clinician: Referring Provider: Treating Provider/Extender: [name] Self, Referral Weeks in Treatment: 0 Information Obtained from: Patient Chief Complaint 04/18/2022; Left LE Ulcer Electronic Signature(s) Signed: 04/18/2022 2:31:54 PM By: [name] Corwin DO Entered By: [name] on 04/18/2022 14:11:47 -------------------------------------------------------------------------------- Debridement Details Patient Name: Date of Service: [name]. 04/18/2022 1:30 PM Medical Record Number: [id number] Patient Account Number: [id number] Date of Birth/Sex: Treating RN: [name], [name] (37 y.o. [name] Primary Care Provider: Celine [name] Other Clinician: Referring Provider: Treating Provider/Extender: [name] Self, Referral Weeks in Treatment: 0 Debridement Performed for Assessment: Wound #5 Left,Medial Lower Leg Performed By: Physician [name] Corwin, MD Debridement Type: Chemical/Enzymatic/Mechanical Agent Used: saline gauze Severity of Tissue Pre Debridement: Fat layer exposed Level of Consciousness (Pre-procedure): Awake and Alert Pre-procedure Verification/Time Out No Taken: Start Time: 14:00 Pain Control: Lidocaine 4% T opical Solution Instrument: Other : saline gauze Bleeding: None End Time: 14:02 Procedural Pain: 8 Post Procedural Pain: 3 Response to Treatment: Procedure was tolerated well Level of Consciousness (Post- Awake and Alert procedure): [name] ([id number]) [id number] Page 2 of 11 Post Debridement  Measurements of Total Wound Length: (cm) 3 Width: (cm) 2.5 Depth: (cm) 0.2 Volume: (cm) 1.178 Character of Wound/Ulcer Post Debridement: Improved Severity of Tissue Post Debridement: Fat layer exposed Post Procedure Diagnosis Same as Pre-procedure Electronic Signature(s) Signed: 04/18/2022 2:17:23 PM By: [name] Pax RN Signed: 04/18/2022 2:31:54 PM By: [name] Corwin DO Entered By: [name] on 04/18/2022 14:17:23 -------------------------------------------------------------------------------- HPI Details Patient Name: Date of Service: [name]. 04/18/2022 1:30 PM Medical Record Number: [id number] Patient Account Number: [id number] Date of Birth/Sex: Treating RN: [name] (37 y.o. [name] Primary Care Provider: Celine [name] Other Clinician: Referring Provider: Treating Provider/Extender: [name] Self, Referral Weeks in Treatment: 0 History of Present Illness HPI Description: 37 year old patient who started with having ulcerations on the right lower leg on the lateral part of her ankle for about 2 weeks. She was seen in the ER at [facility] and advised to see the wound care for a consultation. No X-rays of workup was done during the ER visit and no prescription for any medications of compression wraps were given. the patient is not diabetic but does have hypertension and her medications have been reviewed by me. 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 The right and left greater saphenous vein was found to be tortuous. Deep venous system was also not competent and there was reflux of more than 500 ms. She was then seen by Dr. Karie Schwalbe Early who recommended that the patient would not benefit from endovenous ablation and he had recommended vein stripping odd on the right side and multiple  small phlebectomy procedures on the left side. the patient did not follow-up due to social economic reasons. She has not been wearing any compression stockings and has not taken any specific treatment for varicose veins for the last 3 years. 09/27/2014 -- She has developed a new wound on the medial malleolus which is rather superficial and in the  area where she has stasis dermatitis. We have obtained some appointments to see the vascular surgeons by the end of the month and the patient would like to follow up with me at my [facility] on Wednesday, June 29. 10/14/2014 -- she could not see me yesterday in [place] and hence has come for a review today. She has a vascular workup to be done this afternoon at [facility]. She is doing fine otherwise. [name]/11/2014 -- she was seen by Dr. Althea Charon and he has recommended surgical removal of her right saphenous vein from distal thigh to saphenofemoral odd junction and stab phlebectomy's of multiple large tributary branches throughout her thigh and calf. This would be done under general anesthesia in the outpatient setting. [name]/15/2016 -- she is trying to work on a surgical date and in the meanwhile we have got insurance clearance for Apligraf and we will start this next week. 7/22 2016 -- she is here for the first application of Apligraf. 11/19/2014 -- she is here for a second application of Apligraf 11/26/2014 -- she has done fine after her last application of Apligraf and is awaiting her surgery which is scheduled for August 31. 12/03/2014 -- she is doing fine and is here for her third application of Apligraf. 12/21/2014 -- She had surgery on 12/15/2014 by Dr.Early who did #1 ligation and stripping of right great saphenous vein from distal thigh to saphenofemoral junction, #2 stab phlebectomy of large tributary varicose veins in the thigh popliteal space and calf. She had an Ace wrap up to her groin and this was removed today and the Unna's boot was  also removed. 12/28/2014 -- she is here for her fourth application of Apligraf. 01/06/2015 - he saw her vascular surgeon Dr. Donnetta Hutching who was pleased with her progress and he has confirmed that no surgical procedures could be attempted on the left side. 01/13/2015 -- her wound looks very good and she's been having no problems whatsoever. Readmission: [name], [name] ([id number]) [id number] Page 3 of 11 07/26/2020 upon evaluation today patient presents for initial inspection here in our clinic for a new issue with her left leg although she is previously been seen due to issues with the right leg back in 2016. At that time she was seeing Dr. Donnetta Hutching who is a vein/vascular specialist in [place]. He has since semiretired from what I understand. He is working out of [employer] I believe. Nonetheless she tells me at the time that there was really nothing to do for her left leg although the right leg was where they did most of the work. Subsequently she states that she is done fairly well until just in the past week where she had issues with bleeding from what appears to be varicose vein on the left leg medially. Unfortunately this has continued to be an issue although she tells me at first it was coming much more significantly Down quite a bit but nonetheless has not completely resolved. Every time she showers she notices that it starts to drain a little bit more. She does have a history of chronic venous insufficiency, lymphedema, varicose veins bilaterally, and obesity. 08/02/2020 upon evaluation today patient appears to be doing about the same in regards to the ulcer on her left leg. She has some eschar covering there is definitely some fluid collecting underneath unfortunately. With that being said she tells me she is still having a tremendous amount of pain therefore she is really not able to allow me to clean this off very effectively  to be perfectly honest. I think we need  to try to soften this up 08/16/2020 upon evaluation today patient's wound is really not doing significantly better not really states about the same. She notes that the wrap just does not seem to be staying up very well at all unfortunately. No fevers, chills, nausea, vomiting, or diarrhea. She did cut it off once it starts to slide in order to alleviate some of the pressure from sliding Down. Fortunately there is no signs of active infection at this time which is great news. 08/23/2020 upon evaluation today patient appears to be doing well 08/23/2020 upon evaluation today patient appears to be doing well with regard to her wound all things considered. Fortunately there does not appear to be any signs of active infection at this time which is great news. She has been tolerating the dressing changes without complication and overall I am extremely pleased with where things stand at this point. [facility]. 08/30/2020 upon evaluation today patient actually appears to be doing decently well in regard to her wound. Fortunately there is no signs of active infection which is great news. Nonetheless I do believe that the patient is going require little bit of debridement if she is okay with me attempting that today I think that will help clean off some of the necrotic tissue. Fortunately there does not appear to be otherwise any evidence of active infection which is also great news. 09/19/2020 upon evaluation today patient appears to be doing a little better in regard to her wound as compared to previous. Fortunately there does not appear to be any signs of active infection overall. No fever chills noted. I do believe that the Iodosorb has made this a little bit better with regard to the overall size and appearance of the wound bed though again she does still have quite a ways to go to get this to heal she still very tender to touch. 09/27/2020 upon evaluation  today patient appears to actually be doing quite well with regard to her wound. This is measuring much smaller which is great news. With that being said she [facility] and they subsequently recommended that surgery is really what she probably needs to go forward with sounds like the potential for venous ablation. With that being said the patient tells me this is just not the right time for her to be able to proceed with any type of surgery which I completely understand. Nonetheless I do believe that she would continue to benefit from compression but again that is really not something that she is able to easily do. 10/04/2020 upon evaluation today patient appears to be doing about the same in regard to her wound. This is measuring a little bit smaller but nonetheless still is open and again has some slough and biofilm noted on the surface of the wound. There does not appear to be any signs of active infection which is great news. No fevers, chills, nausea, vomiting, or diarrhea. 10/04/2020 upon evaluation today patient appears to be doing well with regard to her wound. She has been tolerating dressing changes without complication. Fortunately there does not appear to be any signs of active infection which is great news. No fevers, chills, nausea, vomiting, or diarrhea. 10/25/2020 upon evaluation today patient appears to be doing well with regard to her wound. She has been tolerating the dressing changes without complication. Fortunately there is no  signs of active infection at this time. No fevers, chills, nausea, vomiting, or diarrhea. 11/01/2020 upon evaluation today patient with regard to her wound. She has been tolerating the dressing changes without complication. Fortunately there does not appear to be any signs of infection which is great news. No fever chills noted 11/15/2020 upon evaluation today patient appears to be doing well with regard to her wound. Fortunately there  is no signs of active infection at this time. No fevers, chills, nausea, vomiting, or diarrhea. With that being said she continues to have a significant amount of pain at the site even though this is very close to complete closure. She also had several varicose veins around the area which were also problematic. Overall however I feel like the patient is making excellent progress. 11/28/2020 upon evaluation today patient appears to be doing well with regard to her leg ulcer. Again were not really able to debride or compression wrap her due to discomfort and pain. She does not allow for that. With that being said we have been using Iodosorb which does seem to be doing decently well. Fortunately there is no signs of active infection at this time which is great news. No fevers, chills, nausea, vomiting, or diarrhea. 8/31; patient presents for 2-week follow-up. She has been using Iodosorb. She reports that the wound is closed. She denies signs of infection. Readmission: 10-27-2021 upon evaluation this is a patient that presents today whom I have previously seen this is pretty much for the same issue though I think a little bit higher than the last time I saw her. She does have a history of chronic venous insufficiency and hypertension along with varicose veins. Subsequently she does have an ulceration which spontaneously ruptured she has not been wearing any compression which I think is a big part of the issue here. We discussed this before I really think she probably needs to be wearing her compression therapy, she probably needs lymphedema pumps if she can ever wear the compression for a significant amount of time to get these, and subsequently also think that she needs to be elevating her legs is much as possible she may even need some vascular intervention in regard to her veins. All of this was reiterated and discussed with her today to reinforce what needs to happen in order to ensure that her legs do  not get a lot worse. The patient voiced understanding. She tells me that she knows because she is seeing her mom go through a lot of this as well how bad things can get. 11-03-2021 upon evaluation today patient appears to be doing well with regard to her wound. Fortunately there does not appear to be any signs of active infection at this time. She is measuring a little bit bigger but I think this is because the wound is actually cleaning up a bit here. 7/27; left lateral leg wound not any smaller but perhaps with a cleaner surface. She is using Iodoflex to help with the latter and using Tubigrip. She has chronic venous insufficiency with secondary lymphedema. She is apparently followed by vein and vascular and is being scheduled for an ablation 11-17-2021 upon evaluation today patient appears to be doing well with regard to her wound this is actually showing signs of improvement which is great news. Fortunately I do not see any evidence of active infection locally or systemically at this time which is great news. No fevers, chills, nausea, vomiting, or diarrhea. 11-24-2021 upon evaluation today patient's wound is  actually showing signs of significant improvement. Unfortunately she had quite a bit of pain with debridement last week I do believe it was beneficial but at the same time she is doing much better but still really does not want this debrided again I think being that it is looking a whole lot better I would try to avoid that today especially since it caused her so much discomfort that is really not the goal and I explained that to the patient today she voiced understanding and knows that it needed to be done but still states that it was quite painful. 11-30-2021 upon evaluation today patient appears to be doing excellent in regard to her wound this is actually showing signs of excellent improvement I am very pleased with where things stand. She does have her venous ablation appointment for October  17. 12-21-2021 upon evaluation today patient appears to be doing better in regard to her wound this is measuring smaller and looking better as well. Fortunately I do not see any signs of active infection locally or systemically which is great news. 01-01-2022 upon evaluation today patient appears to be doing well currently in regard to her wound. She is showing signs of improvement which is great news [name], [name] ([id number]) [id number] Page 4 of 11 and overall I do not see any signs of active infection locally or systemically at this time. 01-08-2022 upon evaluation today patient appears to be doing well currently in regard to her wound she is actually showing signs of significant improvement which is great news. Fortunately I do not see any evidence of active infection locally or systemically at this time. I do believe that we are on the right track. She also has her appointment October 19 for the venous ablation. 01-16-2022 upon evaluation today patient's wound actually is showing some signs of improvement although this is very slow. Fortunately I do not see any evidence of infection at this time. The volume is a little bit more although the size is smaller I think this is due to the fact that we are slowly cleaning this area out effectively. 11/6 continued improvement the patient is using Iodoflex and a Tubigrip E. She was supposed to have venous surgery [place] however somehow this is gotten delayed till December 14. 02-27-2022 upon evaluation today patient's wound is actually showing signs of being completely healed. Fortunately I do not see any evidence of active infection locally or systemically which is great news and overall I am extremely pleased with where we stand today. Unfortunately she does tell me that she postpone her venous ablation surgery until December 14 she tells me that she was not ready "financially" for this. 04/18/2022;  [name] is a [age] year old female with a past medical history of venous insufficiency that presents the clinic for a left lower extremity wound. She was seen almost 2 months ago for the same wound. This had healed with Iodoflex and Tubigrip. She states that the wound recently reopened. She has seen vein and vascular and plan is for ligation and stripping of the left great saphenous vein. She is not sure when this procedure is going to be scheduled. She has canceled it once before. She has had office compression wraps in the past however these do not stay on and create more of an issue for her. She would like to avoid this. She currently denies signs of infection. Electronic Signature(s) Signed: 04/18/2022 2:31:54 PM By: [name] CorwinHoffman, Hephzibah Strehle DO Entered By: Mikey BussingHoffman,  Shanda BumpsJessica on 04/18/2022 14:17:39 -------------------------------------------------------------------------------- Physical Exam Details Patient Name: Date of Service: [name] MoodySUMMERS, [name]. 04/18/2022 1:30 PM Medical Record Number: [id number] Patient Account Number: [id number] Date of Birth/Sex: Treating RN: [birth year]/[name] (37 y.o. [name] FinnerF) Epps, [name] Primary Care Provider: [name], [name] Other Clinician: Referring Provider: Treating Provider/Extender: [name] CorwinHoffman, Chauntae Hults Self, Referral Weeks in Treatment: 0 Constitutional . Cardiovascular . Psychiatric . Notes Left lower extremity: T the distal medial aspect there is an open wound with granulation tissue and nonviable tissue. T o enderness to palpation. No signs of infection including increased warmth, erythema or purulent drainage. Electronic Signature(s) Signed: 04/18/2022 2:31:54 PM By: [name] CorwinHoffman, Rubylee Zamarripa DO Entered By: [name] CorwinHoffman, Jahaan Vanwagner on 04/18/2022 14:18:11 Blodgett, [name] ([id number]) [id number] Page 5 of 11 -------------------------------------------------------------------------------- Physician Orders Details Patient Name: Date of Service: [name] MoodySUMMERS, [name]  [place] [name]. 04/18/2022 1:30 PM Medical Record Number: [id number] Patient Account Number: [id number] Date of Birth/Sex: Treating RN: [birth year]/[name] (37 y.o. [name] FinnerF) Epps, [name] Primary Care Provider: [name], [name] Other Clinician: Referring Provider: Treating Provider/Extender: [name] CorwinHoffman, Davita Sublett Self, Referral Weeks in Treatment: 0 Verbal / Phone Orders: No Diagnosis Coding ICD-10 Coding Code Description I87.312 Chronic venous hypertension (idiopathic) with ulcer of left lower extremity L97.822 Non-pressure chronic ulcer of other part of left lower leg with fat layer exposed I89.0 Lymphedema, not elsewhere classified Follow-up Appointments Return Appointment in 1 week. Bathing/ Shower/ Hygiene May shower; gently cleanse wound with antibacterial soap, rinse and pat dry prior to dressing wounds Anesthetic (Use 'Patient Medications' Section for Anesthetic Order Entry) Lidocaine applied to wound bed Edema Control - Lymphedema / Segmental Compressive Device / Other Elevate, Exercise Daily and A void Standing for Long Periods of Time. Elevate legs to the level of the heart and pump ankles as often as possible Elevate leg(s) parallel to the floor when sitting. Wound Treatment Wound #5 - Lower Leg Wound Laterality: Left, Medial Cleanser: Byram Ancillary Kit - 15 Day Supply (DME) (Generic) 3 x Per Week/30 Days Discharge Instructions: Use supplies as instructed; Kit contains: (15) Saline Bullets; (15) 3x3 Gauze; 15 pr Gloves Prim Dressing: IODOFLEX 0.9% Cadexomer Iodine Pad 3 x Per Week/30 Days ary Discharge Instructions: Apply Iodoflex to wound bed only as directed. Secondary Dressing: (BORDER) Zetuvit Plus SILICONE BORDER Dressing 4x4 (in/in) (DME) (Generic) 3 x Per Week/30 Days Discharge Instructions: Please do not put silicone bordered dressings under wraps. Use non-bordered dressing only. Secured With: Tubigrip Size F, 4x10 (in/yd) 3 x Per Week/30 Days Discharge Instructions: Apply Tubigrip F 3  finger-widths below knee to base of toes to secure dressing and/or for swelling. Electronic Signature(s) Signed: 04/18/2022 2:31:54 PM By: [name] CorwinHoffman, Prentiss Hammett DO Signed: 04/19/2022 5:05:00 PM By: [name] PaxEpps, [name] RN Entered By: [name] PaxEpps, [name] on 04/18/2022 14:29:37 Gumz, [name] ([id number]) [id number] Page 6 of 11 -------------------------------------------------------------------------------- Problem List Details Patient Name: Date of Service: [name] MoodySUMMERS, [name]. 04/18/2022 1:30 PM Medical Record Number: [id number] Patient Account Number: [id number] Date of Birth/Sex: Treating RN: [birth year]/[name] (37 y.o. [name] FinnerF) Epps, [name] Primary Care Provider: [name], [name] Other Clinician: Referring Provider: Treating Provider/Extender: [name] CorwinHoffman, Kirubel Aja Self, Referral Weeks in Treatment: 0 Active Problems ICD-10 Encounter Code Description Active Date MDM Diagnosis I87.312 Chronic venous hypertension (idiopathic) with ulcer of left lower extremity 04/18/2022 No Yes L97.822 Non-pressure chronic ulcer of other part of left lower leg with fat layer exposed1/06/2022 No Yes I89.0 Lymphedema, not elsewhere classified 04/18/2022 No Yes Inactive Problems Resolved Problems Electronic Signature(s) Signed: 04/18/2022 2:31:54 PM By: [name] CorwinHoffman, Jetaun Colbath DO Entered By: [name] CorwinHoffman, Breonna Gafford on 04/18/2022 14:11:29 -------------------------------------------------------------------------------- Progress Note Details  Patient Name: Date of Service: [name], [name]. 04/18/2022 1:30 PM Medical Record Number: [id number] Patient Account Number: [id number] Date of Birth/Sex: Treating RN: [name][birth year]36 y.o. [name] Primary Care Provider: Celine [name] Other Clinician: Referring Provider: Treating Provider/Extender: [name] Self, Referral Weeks in Treatment: 0 Subjective Chief Complaint Information obtained from Patient AMOS, GABER ([id number]) [id number] Page 7  of 11 04/18/2022; Left LE Ulcer History of Present Illness (HPI) 37 year old patient who started with having ulcerations on the right lower leg on the lateral part of her ankle for about 2 weeks. She was seen in the ER at [facility] and advised to see the wound care for a consultation. No X-rays of workup was done during the ER visit and no prescription for any medications of compression wraps were given. the patient is not diabetic but does have hypertension and her medications have been reviewed by me. 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 The right and left greater saphenous vein was found to be tortuous. Deep venous system was also not competent and there was reflux of more than 500 ms. She was then seen by Dr. Karie Schwalbe Early who recommended that the patient would not benefit from endovenous ablation and he had recommended vein stripping odd on the right side and multiple small phlebectomy procedures on the left side. the patient did not follow-up due to social economic reasons. She has not been wearing any compression stockings and has not taken any specific treatment for varicose veins for the last 3 years. 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 We have obtained some appointments to see the vascular surgeons by the end of the month and the patient would like to follow up with me at my [facility] on Wednesday, June 29. 10/14/2014 -- she could not see me yesterday in [place] and hence has come for a review today. She has a vascular workup to be done this afternoon at [facility]. She is doing fine otherwise. [name]/11/2014 -- she was seen by Dr. Brantley Fling and he has recommended surgical removal of her right saphenous vein  from distal thigh to saphenofemoral odd junction and stab phlebectomy's of multiple large tributary branches throughout her thigh and calf. This would be done under general anesthesia in the outpatient setting. [name]/15/2016 -- she is trying to work on a surgical date and in the meanwhile we have got insurance clearance for Apligraf and we will start this next week. 7/22 2016 -- she is here for the first application of Apligraf. 11/19/2014 -- she is here for a second application of Apligraf 11/26/2014 -- she has done fine after her last application of Apligraf and is awaiting her surgery which is scheduled for August 31. 12/03/2014 -- she is doing fine and is here for her third application of Apligraf. 12/21/2014 -- She had surgery on 12/15/2014 by Dr.Early who did #1 ligation and stripping of right great saphenous vein from distal thigh to saphenofemoral junction, #2 stab phlebectomy of large tributary varicose veins in the thigh popliteal space and calf. She had an Ace wrap up to her groin and this was removed today and the Unna's boot was also removed. 12/28/2014 -- she is here for her fourth application  of Apligraf. 01/06/2015 - he saw her vascular surgeon Dr. Arbie Cookey who was pleased with her progress and he has confirmed that no surgical procedures could be attempted on the left side. 01/13/2015 -- her wound looks very good and she's been having no problems whatsoever. Readmission: 07/26/2020 upon evaluation today patient presents for initial inspection here in our clinic for a new issue with her left leg although she is previously been seen due to issues with the right leg back in 2016. At that time she was seeing Dr. Arbie Cookey who is a vein/vascular specialist in [place]. He has since semiretired from what I understand. He is working out of [employer] I believe. Nonetheless she tells me at the time that there was really nothing to do for her left leg although the right leg was where they did most  of the work. Subsequently she states that she is done fairly well until just in the past week where she had issues with bleeding from what appears to be varicose vein on the left leg medially. Unfortunately this has continued to be an issue although she tells me at first it was coming much more significantly Down quite a bit but nonetheless has not completely resolved. Every time she showers she notices that it starts to drain a little bit more. She does have a history of chronic venous insufficiency, lymphedema, varicose veins bilaterally, and obesity. 08/02/2020 upon evaluation today patient appears to be doing about the same in regards to the ulcer on her left leg. She has some eschar covering there is definitely some fluid collecting underneath unfortunately. With that being said she tells me she is still having a tremendous amount of pain therefore she is really not able to allow me to clean this off very effectively to be perfectly honest. I think we need to try to soften this up 08/16/2020 upon evaluation today patient's wound is really not doing significantly better not really states about the same. She notes that the wrap just does not seem to be staying up very well at all unfortunately. No fevers, chills, nausea, vomiting, or diarrhea. She did cut it off once it starts to slide in order to alleviate some of the pressure from sliding Down. Fortunately there is no signs of active infection at this time which is great news. 08/23/2020 upon evaluation today patient appears to be doing well 08/23/2020 upon evaluation today patient appears to be doing well with regard to her wound all things considered. Fortunately there does not appear to be any signs of active infection at this time which is great news. She has been tolerating the dressing changes without complication and overall I am extremely pleased with where things stand at this point. She does have her appointment with vascular in [facility] on  June 9. 08/30/2020 upon evaluation today patient actually appears to be doing decently well in regard to her wound. Fortunately there is no signs of active infection which is great news. Nonetheless I do believe that the patient is going require little bit of debridement if she is okay with me attempting that today I think that will help clean off some of the necrotic tissue. Fortunately there does not appear to be otherwise any evidence of active infection which is also great news. 09/19/2020 upon evaluation today patient appears to be doing a little better in regard to her wound as compared to previous. Fortunately there does not appear to be any signs of active infection overall. No fever chills noted. I  do believe that the Iodosorb has made this a little bit better with regard to the overall size and appearance of the wound bed though again she does still have quite a ways to go to get this to heal she still very tender to touch. 09/27/2020 upon evaluation today patient appears to actually be doing quite well with regard to her wound. This is measuring much smaller which is great news. With that being said she did see vein and vascular in [facility] and they subsequently recommended that surgery is really what she probably needs to go forward with sounds like the potential for venous ablation. With that being said the patient tells me this is just not the right time for her to be able to proceed with any type of surgery which I completely understand. Nonetheless I do believe that she would continue to benefit from compression but again that is really not something that she is able to easily do. 10/04/2020 upon evaluation today patient appears to be doing about the same in regard to her wound. This is measuring a little bit smaller but nonetheless still is open and again has some slough and biofilm noted on the surface of the wound. There does not appear to be any signs of active infection which is great  news. No fevers, chills, nausea, vomiting, or diarrhea. 10/04/2020 upon evaluation today patient appears to be doing well with regard to her wound. She has been tolerating dressing changes without complication. Fortunately there does not appear to be any signs of active infection which is great news. No fevers, chills, nausea, vomiting, or diarrhea. 10/25/2020 upon evaluation today patient appears to be doing well with regard to her wound. She has been tolerating the dressing changes without complication. Fortunately there is no signs of active infection at this time. No fevers, chills, nausea, vomiting, or diarrhea. 11/01/2020 upon evaluation today patient with regard to her wound. She has been tolerating the dressing changes without complication. Fortunately there does not appear to be any signs of infection which is great news. No fever chills noted 11/15/2020 upon evaluation today patient appears to be doing well with regard to her wound. Fortunately there is no signs of active infection at this time. No [name], [name] ([id number]) [id number] Page 8 of 11 fevers, chills, nausea, vomiting, or diarrhea. With that being said she continues to have a significant amount of pain at the site even though this is very close to complete closure. She also had several varicose veins around the area which were also problematic. Overall however I feel like the patient is making excellent progress. 11/28/2020 upon evaluation today patient appears to be doing well with regard to her leg ulcer. Again were not really able to debride or compression wrap her due to discomfort and pain. She does not allow for that. With that being said we have been using Iodosorb which does seem to be doing decently well. Fortunately there is no signs of active infection at this time which is great news. No fevers, chills, nausea, vomiting, or diarrhea. 8/31; patient presents for 2-week follow-up. She has been  using Iodosorb. She reports that the wound is closed. She denies signs of infection. Readmission: 10-27-2021 upon evaluation this is a patient that presents today whom I have previously seen this is pretty much for the same issue though I think a little bit higher than the last time I saw her. She does have a history of chronic venous insufficiency and hypertension along with varicose  veins. Subsequently she does have an ulceration which spontaneously ruptured she has not been wearing any compression which I think is a big part of the issue here. We discussed this before I really think she probably needs to be wearing her compression therapy, she probably needs lymphedema pumps if she can ever wear the compression for a significant amount of time to get these, and subsequently also think that she needs to be elevating her legs is much as possible she may even need some vascular intervention in regard to her veins. All of this was reiterated and discussed with her today to reinforce what needs to happen in order to ensure that her legs do not get a lot worse. The patient voiced understanding. She tells me that she knows because she is seeing her mom go through a lot of this as well how bad things can get. 11-03-2021 upon evaluation today patient appears to be doing well with regard to her wound. Fortunately there does not appear to be any signs of active infection at this time. She is measuring a little bit bigger but I think this is because the wound is actually cleaning up a bit here. 7/27; left lateral leg wound not any smaller but perhaps with a cleaner surface. She is using Iodoflex to help with the latter and using Tubigrip. She has chronic venous insufficiency with secondary lymphedema. She is apparently followed by vein and vascular and is being scheduled for an ablation 11-17-2021 upon evaluation today patient appears to be doing well with regard to her wound this is actually showing signs of  improvement which is great news. Fortunately I do not see any evidence of active infection locally or systemically at this time which is great news. No fevers, chills, nausea, vomiting, or diarrhea. 11-24-2021 upon evaluation today patient's wound is actually showing signs of significant improvement. Unfortunately she had quite a bit of pain with debridement last week I do believe it was beneficial but at the same time she is doing much better but still really does not want this debrided again I think being that it is looking a whole lot better I would try to avoid that today especially since it caused her so much discomfort that is really not the goal and I explained that to the patient today she voiced understanding and knows that it needed to be done but still states that it was quite painful. 11-30-2021 upon evaluation today patient appears to be doing excellent in regard to her wound this is actually showing signs of excellent improvement I am very pleased with where things stand. She does have her venous ablation appointment for October 17. 12-21-2021 upon evaluation today patient appears to be doing better in regard to her wound this is measuring smaller and looking better as well. Fortunately I do not see any signs of active infection locally or systemically which is great news. 01-01-2022 upon evaluation today patient appears to be doing well currently in regard to her wound. She is showing signs of improvement which is great news and overall I do not see any signs of active infection locally or systemically at this time. 01-08-2022 upon evaluation today patient appears to be doing well currently in regard to her wound she is actually showing signs of significant improvement which is great news. Fortunately I do not see any evidence of active infection locally or systemically at this time. I do believe that we are on the right track. She also has her appointment October  19 for the venous  ablation. 01-16-2022 upon evaluation today patient's wound actually is showing some signs of improvement although this is very slow. Fortunately I do not see any evidence of infection at this time. The volume is a little bit more although the size is smaller I think this is due to the fact that we are slowly cleaning this area out effectively. 11/6 continued improvement the patient is using Iodoflex and a Tubigrip E. She was supposed to have venous surgery at vein and vascular in [place] however somehow this is gotten delayed till December 14. 02-27-2022 upon evaluation today patient's wound is actually showing signs of being completely healed. Fortunately I do not see any evidence of active infection locally or systemically which is great news and overall I am extremely pleased with where we stand today. Unfortunately she does tell me that she postpone her venous ablation surgery until December 14 she tells me that she was not ready "financially" for this. 04/18/2022; Ms. Henri Guedes is a 37 year old female with a past medical history of venous insufficiency that presents the clinic for a left lower extremity wound. She was seen almost 2 months ago for the same wound. This had healed with Iodoflex and Tubigrip. She states that the wound recently reopened. She has seen vein and vascular and plan is for ligation and stripping of the left great saphenous vein. She is not sure when this procedure is going to be scheduled. She has canceled it once before. She has had office compression wraps in the past however these do not stay on and create more of an issue for her. She would like to avoid this. She currently denies signs of infection. Patient History Information obtained from Patient. Allergies tramadol (Severity: Mild, Reaction: "felt weird"/doesn't tolerated) Social History Never smoker, Marital Status - Single, Alcohol Use - Rarely, Drug Use - Current History - marijuana, Caffeine Use -  Rarely. Medical History Cardiovascular Patient has history of Hypertension, Peripheral Venous Disease Hospitalization/Surgery History - umb hernia repair. - right lower leg vein surgery. Medical A Surgical History Notes nd Endocrine pre DM, A1C 5.5 12/21 TIESHIA, RETTINGER ([id number]) [phone].pdf Page 9 of 11 Objective Constitutional Vitals Time Taken: 1:40 PM, Height: 66 in, Source: Stated, Weight: 400 lbs, Source: Stated, BMI: 64.6, Temperature: 98.4 F, Pulse: 115 bpm, Respiratory Rate: 18 breaths/min, Blood Pressure: 117/78 mmHg. General Notes: Left lower extremity: T the distal medial aspect there is an open wound with granulation tissue and nonviable tissue. T o enderness to palpation. No signs of infection including increased warmth, erythema or purulent drainage. Integumentary (Hair, Skin) Wound #5 status is Open. Original cause of wound was Gradually Appeared. The date acquired was: 04/10/2022. The wound is located on the Left,Medial Lower Leg. The wound measures 2cm length x 3.5cm width x 0.2cm depth; 5.498cm^2 area and 1.1cm^3 volume. There is Fat Layer (Subcutaneous Tissue) exposed. There is no tunneling or undermining noted. There is a medium amount of serosanguineous drainage noted. There is small (1-33%) pink granulation within the wound bed. There is a large (67-100%) amount of necrotic tissue within the wound bed including Eschar and Adherent Slough. 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 It does not sound like she has been wearing compression therapy She has been  treated in the past with Iodoflex and Tubigrip and I recommended continuing this. She  has an inverted champagne bottle shape to her leg and would likely do poorly with an in office wrap. I did recommend that she obtain compression stockings to use once her wound has healed. We gave her information to order these. Also encouraged her to follow-up with vein and vascular For ligation and stripping of the left great saphenous vein as this will likely help lower the chance of recurring wounds. Follow-up in 1 week. Procedures Wound #5 Pre-procedure diagnosis of Wound #5 is a Venous Leg Ulcer located on the Left,Medial Lower Leg .Severity of Tissue Pre Debridement is: Fat layer exposed. There was a Chemical/Enzymatic/Mechanical debridement performed by [name] Corwin, MD. With the following instrument(s): saline gauze after achieving pain control using Lidocaine 4% Topical Solution. Other agent used was saline gauze. There was no bleeding. The procedure was tolerated well with a pain level of 8 throughout and a pain level of 3 following the procedure. Post Debridement Measurements: 3cm length x 2.5cm width x 0.2cm depth; 1.178cm^3 volume. Character of Wound/Ulcer Post Debridement is improved. Severity of Tissue Post Debridement is: Fat layer exposed. Post procedure Diagnosis Wound #5: Same as Pre-Procedure Plan 1. Iodoflex under Tubigrip 2. Follow-up in 1 week Electronic Signature(s) Signed: 04/18/2022 2:31:54 PM By: [name] Corwin DO Entered By: [name] on 04/18/2022 14:21:13 Reek, [name] ([id number]) [id number] Page 10 of 11 -------------------------------------------------------------------------------- ROS/PFSH Details Patient Name: Date of Service: KAMARA, ALLAN [name]. 04/18/2022 1:30 PM Medical Record Number: [id number] Patient Account Number: [id number] Date of Birth/Sex: Treating RN: [birth year] (37 y.o. [name] Primary Care Provider: Celine [name] Other Clinician: Referring Provider: Treating Provider/Extender: [name] Self, Referral Weeks in Treatment: 0 Information Obtained From Patient Cardiovascular Medical History: Positive for: Hypertension; Peripheral Venous Disease Endocrine Medical History: Past Medical History Notes: pre DM, A1C 5.5 12/21 Immunizations Pneumococcal Vaccine: Received Pneumococcal Vaccination: No Implantable Devices None Hospitalization / Surgery History Type of Hospitalization/Surgery umb hernia repair right lower leg vein surgery Family and Social History Never smoker; Marital Status - Single; Alcohol Use: Rarely; Drug Use: Current History - marijuana; Caffeine Use: Rarely; Financial Concerns: No; Food, Clothing or Shelter Needs: No; Support System Lacking: No; Transportation Concerns: No Electronic Signature(s) Signed: 04/18/2022 1:46:18 PM By: [name] Corwin DO Signed: 04/19/2022 5:05:00 PM By: [name] Pax RN Entered By: [name] on 04/18/2022 13:41:48 -------------------------------------------------------------------------------- SuperBill Details Patient Name: Date of Service: [name]. 04/18/2022 Medical Record Number: [id number] Patient Account Number: [id number] ELEINA, JERGENS ([id number]) [id number] Page 11 of 11 Date of Birth/Sex: Treating RN: [birth year] (36 y.o. [name] Primary Care Provider: Celine [name] Other Clinician: Referring Provider: Treating Provider/Extender: [name] Self, Referral Weeks in Treatment: 0 Diagnosis Coding ICD-10 Codes Code Description [phone] Chronic venous hypertension (idiopathic) with ulcer of left lower extremity L97.822 Non-pressure chronic ulcer of other part of left lower leg with fat layer exposed I89.0 Lymphedema, not elsewhere classified Facility Procedures : CPT4 Code: 60109323 Description: 99213 - WOUND CARE VISIT-LEV 3 EST PT Modifier: Quantity: 1 Physician Procedures : CPT4 Code Description Modifier [id number] 99213 - WC PHYS LEVEL 3 - EST  PT ICD-10 Diagnosis Description I87.312 Chronic venous hypertension (idiopathic) with ulcer of left lower extremity L97.822 Non-pressure chronic ulcer of other part of left lower leg  with fat layer exposed I89.0 Lymphedema, not elsewhere classified Quantity: 1 Electronic Signature(s) Signed: 04/18/2022 2:24:59 PM By: [name] Pax RN Signed: 04/18/2022 2:31:54 PM By: Mikey Bussing,  Dioselina Brumbaugh DO Entered By: [name] PaxEpps, [name] on 04/18/2022 14:24:59

## 2022-04-20 NOTE — Progress Notes (Signed)
[name] ([id number]) [id number] Page 1 of 10 Visit Report for 04/18/2022 Allergy List Details Patient Name: Date of Service: [name], [name] [place] J. 04/18/2022 1:30 PM Medical Record Number: [id number] Patient Account Number: [id number] Date of Birth/Sex: Treating RN: 1985-11-07 (37 y.o. Freddy Finner Primary Care Daleen Steinhaus: [name] Other Clinician: Referring Annalaura Sauseda: Treating Fredrico Beedle/Extender: Geralyn Corwin Self, Referral Weeks in Treatment: 0 Allergies Active Allergies tramadol Reaction: "felt weird"/doesn't tolerated Severity: Mild Allergy Notes Electronic Signature(s) Signed: 04/19/2022 5:05:00 PM By: Yevonne Pax RN Entered By: Yevonne Pax on 04/18/2022 13:41:40 -------------------------------------------------------------------------------- Arrival Information Details Patient Name: Date of Service: [name] Moody MA J. 04/18/2022 1:30 PM Medical Record Number: [id number] Patient Account Number: [id number] Date of Birth/Sex: Treating RN: Apr 27, 1985 (37 y.o. Freddy Finner Primary Care Braidyn Scorsone: [name] Other Clinician: Referring Pharaoh Pio: Treating Kalenna Millett/Extender: Geralyn Corwin Self, Referral Weeks in Treatment: 0 Visit Information Patient Arrived: Ambulatory Arrival Time: 13:38 Accompanied By: self Transfer Assistance: None Patient Identification Verified: Yes Secondary Verification Process Completed: Yes Patient Requires Transmission-Based Precautions: No Patient Has Alerts: No [name], [name] ([id number]) History Since Last Visit Added or deleted any medications: No Any new allergies or adverse reactions: No Had a fall or experienced change in activities of daily living that may affect risk of falls: No Signs or symptoms of abuse/neglect since last visito No Hospitalized since last visit: No Implantable device outside of the clinic excluding cellular tissue based products placed in the center since last visit:  No Pain Present Now: Yes [phone].pdf Page 2 of 10 Electronic Signature(s) Signed: 04/19/2022 5:05:00 PM By: Yevonne Pax RN Entered By: Yevonne Pax on 04/18/2022 13:39:36 -------------------------------------------------------------------------------- Clinic Level of Care Assessment Details Patient Name: Date of Service: [name] [place]. 04/18/2022 1:30 PM Medical Record Number: [id number] Patient Account Number: [id number] Date of Birth/Sex: Treating RN: [birth year] (37 y.o. Freddy Finner Primary Care Maryjean Corpening: [name] Other Clinician: Referring Herve Haug: Treating Doniqua Saxby/Extender: Geralyn Corwin Self, Referral Weeks in Treatment: 0 Clinic Level of Care Assessment Items TOOL 2 Quantity Score X- 1 0 Use when only an EandM is performed on the INITIAL visit ASSESSMENTS - Nursing Assessment / Reassessment X- 1 20 General Physical Exam (combine w/ comprehensive assessment (listed just below) when performed on new pt. evals) X- 1 25 Comprehensive Assessment (HX, ROS, Risk Assessments, Wounds Hx, etc.) ASSESSMENTS - Wound and Skin A ssessment / Reassessment X - Simple Wound Assessment / Reassessment - one wound 1 5 []  - 0 Complex Wound Assessment / Reassessment - multiple wounds []  - 0 Dermatologic / Skin Assessment (not related to wound area) ASSESSMENTS - Ostomy and/or Continence Assessment and Care []  - 0 Incontinence Assessment and Management []  - 0 Ostomy Care Assessment and Management (repouching, etc.) PROCESS - Coordination of Care X - Simple Patient / Family Education for ongoing care 1 15 []  - 0 Complex (extensive) Patient / Family Education for ongoing care []  - 0 Staff obtains , Records, T Results / Process Orders est []  - 0 Staff telephones HHA, Nursing Homes / Clarify orders / etc []  - 0 Routine Transfer to another Facility (non-emergent condition) []  - 0 Routine Hospital Admission (non-emergent condition) X- 1  15 New Admissions / / Ordering NPWT Apligraf, etc. , []  - 0 Emergency Hospital Admission (emergent condition) X- 1 10 Simple Discharge Coordination []  - 0 Complex (extensive) Discharge Coordination PROCESS - Special Needs []  - 0 Pediatric / Minor Patient Management []  - 0 Isolation Patient Management []  - 0 Hearing / Language /  Visual special needs []  - 0 Assessment of Community assistance (transportation, D/C planning, etc.) [name], [name] ([id number]) [id number] Page 3 of 10 []  - 0 Additional assistance / Altered mentation []  - 0 Support Surface(s) Assessment (bed, cushion, seat, etc.) INTERVENTIONS - Wound Cleansing / Measurement X- 1 5 Wound Imaging (photographs - any number of wounds) []  - 0 Wound Tracing (instead of photographs) X- 1 5 Simple Wound Measurement - one wound []  - 0 Complex Wound Measurement - multiple wounds X- 1 5 Simple Wound Cleansing - one wound []  - 0 Complex Wound Cleansing - multiple wounds INTERVENTIONS - Wound Dressings X - Small Wound Dressing one or multiple wounds 1 10 []  - 0 Medium Wound Dressing one or multiple wounds []  - 0 Large Wound Dressing one or multiple wounds []  - 0 Application of Medications - injection INTERVENTIONS - Miscellaneous []  - 0 External ear exam []  - 0 Specimen Collection (cultures, biopsies, blood, body fluids, etc.) []  - 0 Specimen(s) / Culture(s) sent or taken to Lab for analysis []  - 0 Patient Transfer (multiple staff / Civil Service fast streamer / Similar devices) []  - 0 Simple Staple / Suture removal (25 or less) []  - 0 Complex Staple / Suture removal (26 or more) []  - 0 Hypo / Hyperglycemic Management (close monitor of Blood Glucose) []  - 0 Ankle / Brachial Index (ABI) - do not check if billed separately Has the patient been seen at the hospital within the last three years: Yes Total Score: 115 Level Of Care: New/Established - Level 3 Electronic  Signature(s) Signed: 04/19/2022 5:05:00 PM By: Carlene Coria RN Entered By: Carlene Coria on 04/18/2022 14:24:42 -------------------------------------------------------------------------------- Encounter Discharge Information Details Patient Name: Date of Service: [name] Corporal [place]. 04/18/2022 1:30 PM Medical Record Number: [id number] Patient Account Number: [id number] Date of Birth/Sex: Treating RN: [birth year] (37 y.o. Orvan Falconer Primary Care Jatavius Ellenwood: [name] Other Clinician: Referring Blaike Newburn: Treating Seichi Kaufhold/Extender: Kalman Shan Self, Referral Weeks in Treatment: 0 Encounter Discharge Information Items Post Procedure Vitals Discharge Condition: Stable Temperature (F): 98.4 Ambulatory Status: Ambulatory Pulse (bpm): 115 Discharge Destination: Home Respiratory Rate (breaths/min): 18 Transportation: Private Auto Blood Pressure (mmHg): 117/78 Accompanied By: self [name] ([id number]) [id number] Page 4 of 10 Schedule Follow-up Appointment: Yes Clinical Summary of Care: Electronic Signature(s) Signed: 04/18/2022 2:29:01 PM By: Carlene Coria RN Entered By: Carlene Coria on 04/18/2022 14:29:01 -------------------------------------------------------------------------------- Lower Extremity Assessment Details Patient Name: Date of Service: [name], [name] [place] 04/18/2022 1:30 PM Medical Record Number: [id number] Patient Account Number: [id number] Date of Birth/Sex: Treating RN: [birth year] (37 y.o. Orvan Falconer Primary Care Alaina Donati: [name] Other Clinician: Referring Lailie Smead: Treating Byran Bilotti/Extender: Kalman Shan Self, Referral Weeks in Treatment: 0 Edema Assessment Assessed: [Left: No] [Right: No] [Left: Edema] [Right: :] Calf Left: Right: Point of Measurement: 32 cm From Medial Instep 62 cm Ankle Left: Right: Point of Measurement: 12 cm From Medial Instep 33 cm Knee To Floor Left: Right: From Medial Instep  44 cm Vascular Assessment Pulses: Dorsalis Pedis Palpable: [Left:Yes] Notes refused testing stating its just to painful Electronic Signature(s) Signed: 04/19/2022 5:05:00 PM By: Carlene Coria RN Entered By: Carlene Coria on 04/18/2022 13:56:51 Clive, Herminio Heads ([id number]) [id number] Page 5 of 10 -------------------------------------------------------------------------------- Multi Wound Chart Details Patient Name: Date of Service: [name], [name] 04/18/2022 1:30 PM Medical Record Number: [id number] Patient Account Number: [id number] Date of Birth/Sex: Treating RN: Aug 24, 1985 (37 y.o. Orvan Falconer Primary Care Apollos Tenbrink: [name] Other Clinician: Referring Xareni Kelch: Treating Maquita Sandoval/Extender: Kalman Shan  Self, Referral Weeks in Treatment: 0 Vital Signs Height(in): 66 Pulse(bpm): 115 Weight(lbs): 400 Blood Pressure(mmHg): 117/78 Body Mass Index(BMI): 64.6 Temperature(F): 98.4 Respiratory Rate(breaths/min): 18 [5:Photos:] [N/A:N/A] Left, Medial Lower Leg N/A N/A Wound Location: Gradually Appeared N/A N/A Wounding Event: Venous Leg Ulcer N/A N/A Primary Etiology: Hypertension, Peripheral Venous N/A N/A Comorbid History: Disease 04/10/2022 N/A N/A Date Acquired: 0 N/A N/A Weeks of Treatment: Open N/A N/A Wound Status: No N/A N/A Wound Recurrence: 2x3.5x0.2 N/A N/A Measurements L x W x D (cm) 5.498 N/A N/A A (cm) : rea 1.1 N/A N/A Volume (cm) : Full Thickness Without Exposed N/A N/A Classification: Support Structures Medium N/A N/A Exudate A mount: Serosanguineous N/A N/A Exudate Type: red, brown N/A N/A Exudate Color: Small (1-33%) N/A N/A Granulation Amount: Pink N/A N/A Granulation Quality: Large (67-100%) N/A N/A Necrotic Amount: Eschar, Adherent Slough N/A N/A Necrotic Tissue: Fat Layer (Subcutaneous Tissue): Yes N/A N/A Exposed Structures: Fascia: No Tendon: No Muscle: No Joint: No Bone: No None  N/A N/A Epithelialization: Treatment Notes Electronic Signature(s) Signed: 04/18/2022 2:15:57 PM By: Yevonne Pax RN Entered By: Yevonne Pax on 04/18/2022 14:15:57 Dinneen, [name] ([id number]) [id number] Page 6 of 10 -------------------------------------------------------------------------------- Multi-Disciplinary Care Plan Details Patient Name: Date of Service: [name], [name] [place] J. 04/18/2022 1:30 PM Medical Record Number: [id number] Patient Account Number: [id number] Date of Birth/Sex: Treating RN: May 25, 1985 (37 y.o. Freddy Finner Primary Care Saory Carriero: [name] Other Clinician: Referring Rashid Whitenight: Treating Kenzlei Runions/Extender: Geralyn Corwin Self, Referral Weeks in Treatment: 0 Active Inactive Necrotic Tissue Nursing Diagnoses: Knowledge deficit related to management of necrotic/devitalized tissue Goals: Patient/caregiver will verbalize understanding of reason and process for debridement of necrotic tissue Date Initiated: 04/18/2022 Target Resolution Date: 05/19/2022 Goal Status: Active Interventions: Assess patient pain level pre-, during and post procedure and prior to discharge Provide education on necrotic tissue and debridement process Notes: Venous Leg Ulcer Nursing Diagnoses: Knowledge deficit related to disease process and management Goals: Patient will maintain optimal edema control Date Initiated: 04/18/2022 Target Resolution Date: 05/19/2022 Goal Status: Active Interventions: Assess peripheral edema status every visit. Compression as ordered Notes: Wound/Skin Impairment Nursing Diagnoses: Knowledge deficit related to ulceration/compromised skin integrity Goals: Patient/caregiver will verbalize understanding of skin care regimen Date Initiated: 04/18/2022 Target Resolution Date: 05/19/2022 Goal Status: Active Ulcer/skin breakdown will have a volume reduction of 30% by week 4 Date Initiated: 04/18/2022 Target Resolution Date:  05/19/2022 Goal Status: Active Ulcer/skin breakdown will have a volume reduction of 50% by week 8 Date Initiated: 04/18/2022 Target Resolution Date: 06/18/2022 Goal Status: Active Ulcer/skin breakdown will have a volume reduction of 80% by week 12 Date Initiated: 04/18/2022 Target Resolution Date: 07/18/2022 Goal Status: Active Ulcer/skin breakdown will heal within 14 weeks Date Initiated: 04/18/2022 Target Resolution Date: 08/17/2022 KARMAH, POTOCKI ([id number]) [id number] Page 7 of 10 Goal Status: Active Interventions: Assess patient/caregiver ability to obtain necessary supplies Assess patient/caregiver ability to perform ulcer/skin care regimen upon admission and as needed Assess ulceration(s) every visit Notes: Electronic Signature(s) Signed: 04/18/2022 2:27:59 PM By: Yevonne Pax RN Entered By: Yevonne Pax on 04/18/2022 14:27:59 -------------------------------------------------------------------------------- Pain Assessment Details Patient Name: Date of Service: [name], FIORELLO [place] J. 04/18/2022 1:30 PM Medical Record Number: [id number] Patient Account Number: [id number] Date of Birth/Sex: Treating RN: 1985/11/03 (37 y.o. Freddy Finner Primary Care Lionardo Haze: [name] Other Clinician: Referring Saquoia Sianez: Treating Myrta Mercer/Extender: Geralyn Corwin Self, Referral Weeks in Treatment: 0 Active Problems Location of Pain Severity and Description of Pain Patient Has Paino Yes Site Locations With Dressing Change: Yes  Duration of the Pain. Constant / Intermittento Constant Rate the pain. Current Pain Level: 10 Worst Pain Level: 10 Least Pain Level: 5 Tolerable Pain Level: 5 Character of Pain Describe the Pain: Burning, Throbbing Pain Management and Medication Current Pain Management: Medication: Yes Cold Application: No Rest: Yes Massage: No Activity: No T.E.N.S.: No Heat Application: No Leg drop or elevation: No Is the Current Pain Management  Adequate: Inadequate How does your wound impact your activities of daily livingo Sleep: Yes Bathing: No Appetite: No Relationship With Others: No Bladder Continence: No Emotions: No Bowel Continence: No Work: No [name], [name] ([id number]) [id number] Page 8 of 10 Toileting: No Drive: No Dressing: No Hobbies: No Electronic Signature(s) Signed: 04/19/2022 5:05:00 PM By: Yevonne Pax RN Entered By: Yevonne Pax on 04/18/2022 13:40:34 -------------------------------------------------------------------------------- Patient/Caregiver Education Details Patient Name: Date of Service: [name] Moody MA [place] 1/3/2024andnbsp1:30 PM Medical Record Number: [id number] Patient Account Number: [id number] Date of Birth/Gender: Treating RN: December 02, 1985 (37 y.o. Freddy Finner Primary Care Physician: [name] Other Clinician: Referring Physician: Treating Physician/Extender: Geralyn Corwin Self, Referral Weeks in Treatment: 0 Education Assessment Education Provided To: Patient Education Topics Provided Welcome T [facility] Packet: o Methods: Explain/Verbal Responses: State content correctly Electronic Signature(s) Signed: 04/19/2022 5:05:00 PM By: Yevonne Pax RN Entered By: Yevonne Pax on 04/18/2022 14:25:14 -------------------------------------------------------------------------------- Wound Assessment Details Patient Name: Date of Service: [name], MONE [place] J. 04/18/2022 1:30 PM Medical Record Number: [id number] Patient Account Number: [id number] Date of Birth/Sex: Treating RN: 23-May-1985 (37 y.o. Freddy Finner Primary Care Lorree Millar: [name] Other Clinician: Referring Ivannah Zody: Treating Addisen Chappelle/Extender: Geralyn Corwin Self, Referral Weeks in Treatment: 0 Wound Status Wound Number: 5 Primary Etiology: Venous Leg Ulcer Wound Location: Left, Medial Lower Leg Wound Status: Open Wounding Event: Gradually  Appeared Comorbid History: Hypertension, Peripheral Venous Disease Date Acquired: 04/10/2022 [name], [place] ([id number]) [id number] Page 9 of 10 Weeks Of Treatment: 0 Clustered Wound: No Photos Wound Measurements Length: (cm) 2 Width: (cm) 3.5 Depth: (cm) 0.2 Area: (cm) 5.498 Volume: (cm) 1.1 % Reduction in Area: % Reduction in Volume: Epithelialization: None Tunneling: No Undermining: No Wound Description Classification: Full Thickness Without Exposed Support Exudate Amount: Medium Exudate Type: Serosanguineous Exudate Color: red, brown Structures Foul Odor After Cleansing: No Slough/Fibrino Yes Wound Bed Granulation Amount: Small (1-33%) Exposed Structure Granulation Quality: Pink Fascia Exposed: No Necrotic Amount: Large (67-100%) Fat Layer (Subcutaneous Tissue) Exposed: Yes Necrotic Quality: Eschar, Adherent Slough Tendon Exposed: No Muscle Exposed: No Joint Exposed: No Bone Exposed: No Treatment Notes Wound #5 (Lower Leg) Wound Laterality: Left, Medial Cleanser Byram Ancillary Kit - 15 Day Supply Discharge Instruction: Use supplies as instructed; Kit contains: (15) Saline Bullets; (15) 3x3 Gauze; 15 pr Gloves Peri-Wound Care Topical Primary Dressing IODOFLEX 0.9% Cadexomer Iodine Pad Discharge Instruction: Apply Iodoflex to wound bed only as directed. Secondary Dressing (BORDER) Zetuvit Plus SILICONE BORDER Dressing 4x4 (in/in) Discharge Instruction: Please do not put silicone bordered dressings under wraps. Use non-bordered dressing only. Secured With Tubigrip Size F, 4x10 (in/yd) Discharge Instruction: Apply Tubigrip F 3 finger-widths below knee to base of toes to secure dressing and/or for swelling. Compression Wrap Compression Stockings Add-Ons Electronic Signature(s) Signed: 04/19/2022 5:05:00 PM By: Yevonne Pax RN Entered By: Yevonne Pax on 04/18/2022 13:55:13 Gosling, [name] ([id number])  [id number] Page 10 of 10 -------------------------------------------------------------------------------- Vitals Details Patient Name: Date of Service: CAMREN, HENTHORN [place] J. 04/18/2022 1:30 PM Medical Record Number: [id number] Patient Account Number: [id number] Date of Birth/Sex: Treating RN: [birth year] (36 y.o.  Voncille Lo, [place] Primary Care Basir Niven: [name] Other Clinician: Referring Jeven Topper: Treating Marge Vandermeulen/Extender: Kalman Shan Self, Referral Weeks in Treatment: 0 Vital Signs Time Taken: 13:40 Temperature (F): 98.4 Height (in): 66 Pulse (bpm): 115 Source: Stated Respiratory Rate (breaths/min): 18 Weight (lbs): 400 Blood Pressure (mmHg): 117/78 Source: Stated Reference Range: 80 - 120 mg / dl Body Mass Index (BMI): 64.6 Electronic Signature(s) Signed: 04/19/2022 5:05:00 PM By: Carlene Coria RN Entered By: Carlene Coria on 04/18/2022 13:41:29

## 2022-04-20 NOTE — Progress Notes (Signed)
[name] ([id number]) [id number] Page 1 of 5 Visit Report for 04/18/2022 Abuse Risk Screen Details Patient Name: Date of Service: [name] 04/18/2022 1:30 PM Medical Record Number: [id number] Patient Account Number: [id number] Date of Birth/Sex: Treating RN: [birth year] (37 y.o. [name] Primary Care [name]: [name] Other Clinician: Referring [name]: Treating [name] Self, Referral Weeks in Treatment: 0 Abuse Risk Screen Items Answer ABUSE RISK SCREEN: Has anyone close to you tried to hurt or harm you recentlyo No Do you feel uncomfortable with anyone in your familyo No Has anyone forced you do things that you didnt want to doo No Electronic Signature(s) Signed: 04/19/2022 5:05:00 PM By: Carlene Coria RN Entered By: Carlene Coria on 04/18/2022 13:41:58 -------------------------------------------------------------------------------- Activities of Daily Living Details Patient Name: Date of Service: [name], [name] [place] J. 04/18/2022 1:30 PM Medical Record Number: [id number] Patient Account Number: [id number] Date of Birth/Sex: Treating RN: [name] (37 y.o. [name] Primary Care [name]: [name] Other Clinician: Referring Shariya Gaster: Treating [name]/Extender: [name] Self, Referral Weeks in Treatment: 0 Activities of Daily Living Items Answer Activities of Daily Living (Please select one for each item) Drive Automobile Completely Able T Medications ake Completely Able Use T elephone Completely Able Care for Appearance Completely Able Use T oilet Completely Able Bath / Shower Completely Able Dress Self Completely Able Feed Self Completely Able Walk Completely Able Get In / Out Bed Completely Able Housework Completely [name] ([id number]) [phone] [id number].pdf Page 2 of 5 Prepare Meals Completely Able Handle Money Completely  Able Shop for Self Completely Able Electronic Signature(s) Signed: 04/19/2022 5:05:00 PM By: Carlene Coria RN Entered By: Carlene Coria on 04/18/2022 13:42:19 -------------------------------------------------------------------------------- Education Screening Details Patient Name: Date of Service: [name], [name] 04/18/2022 1:30 PM Medical Record Number: [id number] Patient Account Number: [id number] Date of Birth/Sex: Treating RN: [name] (37 y.o. [name] Primary Care [name]: [name] Other Clinician: Referring Danica Camarena: Treating [name]/Extender: [name] Self, Referral Weeks in Treatment: 0 Primary Learner Assessed: Patient Learning Preferences/Education Level/Primary Language Learning Preference: Explanation Highest Education Level: High School Preferred Language: English Cognitive Barrier Language Barrier: No Translator Needed: No Memory Deficit: No Emotional Barrier: No Cultural/Religious Beliefs Affecting Medical Care: No Physical Barrier Impaired Vision: Yes Glasses Impaired Hearing: No Decreased Hand dexterity: No Knowledge/Comprehension Knowledge Level: Medium Comprehension Level: High Ability to understand written instructions: High Ability to understand verbal instructions: High Motivation Anxiety Level: Anxious Cooperation: Cooperative Education Importance: Acknowledges Need Interest in Health Problems: Asks Questions Perception: Coherent Willingness to Engage in Self-Management High Activities: Readiness to Engage in Self-Management High Activities: Electronic Signature(s) Signed: 04/19/2022 5:05:00 PM By: Carlene Coria RN Entered By: Carlene Coria on 04/18/2022 13:42:49 [name] ([id number]) [id number] Page 3 of 5 -------------------------------------------------------------------------------- Fall Risk Assessment Details Patient Name: Date of Service: [name], [name] [name] 04/18/2022 1:30  PM Medical Record Number: [id number] Patient Account Number: [id number] Date of Birth/Sex: Treating RN: [name] (37 y.o. [name] Primary Care [name]: [name] Other Clinician: Referring [name]: Treating [name]/Extender: [name] Self, Referral Weeks in Treatment: 0 Fall Risk Assessment Items Have you had 2 or more falls in the last 12 monthso 0 No Have you had any fall that resulted in injury in the last 12 monthso 0 No FALLS RISK SCREEN History of falling - immediate or within 3 months 0 No Secondary diagnosis (Do you have 2 or more medical diagnoseso) 0 No Ambulatory aid None/bed rest/wheelchair/nurse 0 No Crutches/cane/walker 0  No Furniture 0 No Intravenous therapy Access/Saline/Heparin Lock 0 No Gait/Transferring Normal/ bed rest/ wheelchair 0 No Weak (short steps with or without shuffle, stooped but able to lift head while walking, may seek 0 No support from furniture) Impaired (short steps with shuffle, may have difficulty arising from chair, head down, impaired 0 No balance) Mental Status Oriented to own ability 0 No Electronic Signature(s) Signed: 04/19/2022 5:05:00 PM By: Carlene Coria RN Entered By: Carlene Coria on 04/18/2022 13:42:56 -------------------------------------------------------------------------------- Foot Assessment Details Patient Name: Date of Service: [name], [place]. 04/18/2022 1:30 PM Medical Record Number: [id number] Patient Account Number: [id number] Date of Birth/Sex: Treating RN: [birth year] (37 y.o. [name] Primary Care Uzoma Vivona: [name] Other Clinician: Referring Kaysen Deal: Treating Kyeshia Zinn/Extender: [name] Self, Referral Weeks in Treatment: 0 Foot Assessment Items Site Locations Alexea, Blase [place] J ([id number]) [id number] Page 4 of 5 + = Sensation present, - = Sensation absent, C = Callus, U = Ulcer R = Redness, W = Warmth, M = Maceration, PU =  Pre-ulcerative lesion F = Fissure, S = Swelling, D = Dryness Assessment Right: Left: Other Deformity: No No Prior Foot Ulcer: No No Prior Amputation: No No Charcot Joint: No No Ambulatory Status: Ambulatory Without Help Gait: Steady Electronic Signature(s) Signed: 04/19/2022 5:05:00 PM By: Carlene Coria RN Entered By: Carlene Coria on 04/18/2022 13:51:09 -------------------------------------------------------------------------------- Nutrition Risk Screening Details Patient Name: Date of Service: [name], [place]. 04/18/2022 1:30 PM Medical Record Number: [id number] Patient Account Number: [id number] Date of Birth/Sex: Treating RN: [name] (36 y.o. [name] Primary Care Katherina Wimer: [name] Other Clinician: Referring Youa Deloney: Treating Boomer Winders/Extender: [name] Self, Referral Weeks in Treatment: 0 Height (in): 66 Weight (lbs): 400 Body Mass Index (BMI): 64.6 Nutrition Risk Screening Items Score Screening NUTRITION RISK SCREEN: I have an illness or condition that made me change the kind and/or amount of food I eat 0 No I eat fewer than two meals per day 0 No I eat few fruits and vegetables, or milk products 0 No I have three or more drinks of beer, liquor or wine almost every day 0 No I have tooth or mouth problems that make it hard for me to eat 0 No I don't always have enough money to buy the food I need 0 No [name], [name] ([id number]) [id number] [id number].pdf Page 5 of 5 I eat alone most of the time 0 No I take three or more different prescribed or over-the-counter drugs a day 0 No Without wanting to, I have lost or gained 10 pounds in the last six months 0 No I am not always physically able to shop, cook and/or feed myself 0 No Nutrition Protocols Good Risk Protocol 0 No interventions needed Moderate Risk Protocol High Risk Proctocol Risk Level: Good Risk Score: 0 Electronic Signature(s) Signed: 04/19/2022 5:05:00 PM By:  Carlene Coria RN Entered By: Carlene Coria on 04/18/2022 13:43:05

## 2022-04-25 ENCOUNTER — Encounter (HOSPITAL_BASED_OUTPATIENT_CLINIC_OR_DEPARTMENT_OTHER): Payer: BC Managed Care – PPO | Admitting: Internal Medicine

## 2022-04-25 DIAGNOSIS — I87312 Chronic venous hypertension (idiopathic) with ulcer of left lower extremity: Secondary | ICD-10-CM

## 2022-04-25 DIAGNOSIS — L97822 Non-pressure chronic ulcer of other part of left lower leg with fat layer exposed: Secondary | ICD-10-CM

## 2022-04-25 DIAGNOSIS — I890 Lymphedema, not elsewhere classified: Secondary | ICD-10-CM

## 2022-04-25 NOTE — Progress Notes (Signed)
[name], [name] ([id number]) [id number] Page 1 of 9 Visit Report for 04/25/2022 Arrival Information Details Patient Name: Date of Service: [name], [name] [place] 04/25/2022 3:45 PM Medical Record Number: [id number] Patient Account Number: [id number] Date of Birth/Sex: Treating RN: 24-Sep-1985 (37 y.o. Sharyne Peach, [name] Primary Care Adeli Frost: [name] Other Clinician: Referring Evolette Pendell: Treating Esme Durkin/Extender: Emeline Darling in Treatment: 1 Visit Information History Since Last Visit Added or deleted any medications: No Patient Arrived: Ambulatory Any new allergies or adverse reactions: No Arrival Time: 15:42 Had a fall or experienced change in No Accompanied By: self activities of daily living that may affect Transfer Assistance: None risk of falls: Patient Identification Verified: Yes Signs or symptoms of abuse/neglect since last visito No Secondary Verification Process Completed: Yes Hospitalized since last visit: No Patient Requires Transmission-Based Precautions: No Implantable device outside of the clinic excluding No Patient Has Alerts: No cellular tissue based products placed in the center since last visit: Has Dressing in Place as Prescribed: Yes Has Compression in Place as Prescribed: No Pain Present Now: Yes Electronic Signature(s) Signed: 04/25/2022 3:57:09 PM By: Yevonne Pax RN Entered By: Yevonne Pax on 04/25/2022 15:57:09 -------------------------------------------------------------------------------- Clinic Level of Care Assessment Details Patient Name: Date of Service: [name], [name] [place] 04/25/2022 3:45 PM Medical Record Number: [id number] Patient Account Number: [id number] Date of Birth/Sex: Treating RN: 1985/06/08 (37 y.o. Freddy Finner Primary Care Avory Mimbs: [name] Other Clinician: Referring Madiha Bambrick: Treating Raymonde Hamblin/Extender: Emeline Darling in Treatment:  1 Clinic Level of Care Assessment Items TOOL 4 Quantity Score X- 1 0 Use when only an EandM is performed on FOLLOW-UP visit ASSESSMENTS - Nursing Assessment / Reassessment X- 1 10 Reassessment of Co-morbidities (includes updates in patient status) X- 1 5 Reassessment of Adherence to Treatment Plan [name], [name] ([id number]) [phone].pdf Page 2 of 9 ASSESSMENTS - Wound and Skin A ssessment / Reassessment X - Simple Wound Assessment / Reassessment - one wound 1 5 []  - 0 Complex Wound Assessment / Reassessment - multiple wounds []  - 0 Dermatologic / Skin Assessment (not related to wound area) ASSESSMENTS - Focused Assessment []  - 0 Circumferential Edema Measurements - multi extremities []  - 0 Nutritional Assessment / Counseling / Intervention []  - 0 Lower Extremity Assessment (monofilament, tuning fork, pulses) []  - 0 Peripheral Arterial Disease Assessment (using hand held doppler) ASSESSMENTS - Ostomy and/or Continence Assessment and Care []  - 0 Incontinence Assessment and Management []  - 0 Ostomy Care Assessment and Management (repouching, etc.) PROCESS - Coordination of Care X - Simple Patient / Family Education for ongoing care 1 15 []  - 0 Complex (extensive) Patient / Family Education for ongoing care []  - 0 Staff obtains , Records, T Results / Process Orders est []  - 0 Staff telephones HHA, Nursing Homes / Clarify orders / etc []  - 0 Routine Transfer to another Facility (non-emergent condition) []  - 0 Routine Hospital Admission (non-emergent condition) []  - 0 New Admissions / / Ordering NPWT Apligraf, etc. , []  - 0 Emergency Hospital Admission (emergent condition) X- 1 10 Simple Discharge Coordination []  - 0 Complex (extensive) Discharge Coordination PROCESS - Special Needs []  - 0 Pediatric / Minor Patient Management []  - 0 Isolation Patient Management []  - 0 Hearing / Language / Visual special  needs []  - 0 Assessment of Community assistance (transportation, D/C planning, etc.) []  - 0 Additional assistance / Altered mentation []  - 0 Support Surface(s) Assessment (bed, cushion, seat, etc.) INTERVENTIONS - Wound Cleansing /  Measurement X - Simple Wound Cleansing - one wound 1 5 []  - 0 Complex Wound Cleansing - multiple wounds X- 1 5 Wound Imaging (photographs - any number of wounds) []  - 0 Wound Tracing (instead of photographs) X- 1 5 Simple Wound Measurement - one wound []  - 0 Complex Wound Measurement - multiple wounds INTERVENTIONS - Wound Dressings X - Small Wound Dressing one or multiple wounds 1 10 []  - 0 Medium Wound Dressing one or multiple wounds []  - 0 Large Wound Dressing one or multiple wounds []  - 0 Application of Medications - topical []  - 0 Application of Medications - injection INTERVENTIONS - Miscellaneous []  - 0 External ear exam [name], [name] ( ) [phone].pdf Page 3 of 9 []  - 0 Specimen Collection (cultures, biopsies, blood, body fluids, etc.) []  - 0 Specimen(s) / Culture(s) sent or taken to Lab for analysis []  - 0 Patient Transfer (multiple staff / Lift / Similar devices) []  - 0 Simple Staple / Suture removal (25 or less) []  - 0 Complex Staple / Suture removal (26 or more) []  - 0 Hypo / Hyperglycemic Management (close monitor of Blood Glucose) []  - 0 Ankle / Brachial Index (ABI) - do not check if billed separately X- 1 5 Vital Signs Has the patient been seen at the hospital within the last three years: Yes Total Score: 75 Level Of Care: New/Established - Level 2 Electronic Signature(s) Signed: 04/26/2022 3:13:07 PM By: RN Entered By: on 04/26/2022 14:38:41 -------------------------------------------------------------------------------- Lower Extremity Assessment Details Patient Name: Date of Service: [name], [name]. 04/25/2022 3:45 PM Medical Record Number:  [id number] Patient Account Number: Date of Birth/Sex: Treating RN: 08/30/1985 (37 y.o. Michiel Sites Primary Care Diasha Castleman: Other Clinician: Referring Venus Ruhe: Treating Amen Staszak/Extender: in Treatment: 1 Edema Assessment Assessed: : No] : No] Edema: [Left: Ye] [Right: s] Calf Left: Right: Point of Measurement: 32 cm From Medial Instep 65 cm Ankle Left: Right: Point of Measurement: 12 cm From Medial Instep 34 cm Vascular Assessment Pulses: Dorsalis Pedis Palpable: [Left:Yes] Electronic Signature(s) Signed: 04/25/2022 3:59:45 PM By: Yevonne Pax RN Entered By: Yevonne Pax on 04/25/2022 15:59:45 Sakurai, Rhoderick Moody ([id number]/[id number].pdf Page 4 of 9 -------------------------------------------------------------------------------- Multi Wound Chart Details Patient Name: Date of Service: [name], [name] 03/23/1986 04/25/2022 3:45 PM Medical Record Number: Freddy Finner Patient Account Number: [name] Date of Birth/Sex: Treating RN: [birth year] (37 y.o. [name] Primary Care Brodric Schauer: 06/24/2022 Other Clinician: Referring Ecko Beasley: Treating Nick Armel/Extender: Yevonne Pax in Treatment: 1 Vital Signs Height(in): 66 Pulse(bpm): 95 Weight(lbs): 400 Blood Pressure(mmHg): 135/94 Body Mass Index(BMI): 64.6 Temperature(F): 98.4 Respiratory Rate(breaths/min): 18 [5:Photos:] [N/A:N/A] Left, Medial Lower Leg N/A N/A Wound Location: Gradually Appeared N/A N/A Wounding Event: Venous Leg Ulcer N/A N/A Primary Etiology: Hypertension, Peripheral Venous N/A N/A Comorbid History: Disease 04/10/2022 N/A N/A Date Acquired: 1 N/A N/A Weeks of Treatment: Open N/A N/A Wound Status: No N/A N/A Wound Recurrence: 2x4x0.3 N/A N/A Measurements L x W x D (cm) 6.283 N/A N/A A (cm) : rea 1.885 N/A N/A Volume (cm) : -14.30% N/A N/A % Reduction in  Area: -71.40% N/A N/A % Reduction in Volume: Full Thickness Without Exposed N/A N/A Classification: Support Structures Medium N/A N/A Exudate A mount: Serosanguineous N/A N/A Exudate Type: red, brown N/A N/A Exudate Color: Small (1-33%) N/A N/A Granulation Amount: Pink N/A N/A Granulation Quality: Large (67-100%) N/A N/A Necrotic Amount: Eschar, Adherent Slough N/A N/A Necrotic Tissue:  Fat Layer (Subcutaneous Tissue): Yes N/A N/A Exposed Structures: Fascia: No Tendon: No Muscle: No Joint: No Bone: No None N/A N/A Epithelialization: Treatment Notes Electronic Signature(s) Signed: 04/25/2022 3:59:50 PM By: Carlene Coria RN Entered By: Carlene Coria on 04/25/2022 15:59:50 [place], Herminio Heads ([id number]) [id number].pdf Page 5 of 9 -------------------------------------------------------------------------------- Multi-Disciplinary Care Plan Details Patient Name: Date of Service: [name], [name] [place] 04/25/2022 3:45 PM Medical Record Number: [id number] Patient Account Number: [id number] Date of Birth/Sex: Treating RN: Aug 05, 1985 (37 y.o. Orvan Falconer Primary Care Attikus Bartoszek: [name] Other Clinician: Referring Nicholette Dolson: Treating Eliseo Withers/Extender: [name] in Treatment: 1 Active Inactive Necrotic Tissue Nursing Diagnoses: Knowledge deficit related to management of necrotic/devitalized tissue Goals: Patient/caregiver will verbalize understanding of reason and process for debridement of necrotic tissue Date Initiated: 04/18/2022 Target Resolution Date: 05/19/2022 Goal Status: Active Interventions: Assess patient pain level pre-, during and post procedure and prior to discharge Provide education on necrotic tissue and debridement process Notes: Venous Leg Ulcer Nursing Diagnoses: Knowledge deficit related to disease process and management Goals: Patient will maintain optimal edema control Date Initiated:  04/18/2022 Target Resolution Date: 05/19/2022 Goal Status: Active Interventions: Assess peripheral edema status every visit. Compression as ordered Notes: Wound/Skin Impairment Nursing Diagnoses: Knowledge deficit related to ulceration/compromised skin integrity Goals: Patient/caregiver will verbalize understanding of skin care regimen Date Initiated: 04/18/2022 Target Resolution Date: 05/19/2022 Goal Status: Active Ulcer/skin breakdown will have a volume reduction of 30% by week 4 Date Initiated: 04/18/2022 Target Resolution Date: 05/19/2022 Goal Status: Active Ulcer/skin breakdown will have a volume reduction of 50% by week 8 Date Initiated: 04/18/2022 Target Resolution Date: 06/18/2022 Goal Status: Active Ulcer/skin breakdown will have a volume reduction of 80% by week 12 Date Initiated: 04/18/2022 Target Resolution Date: 07/18/2022 Goal Status: Active Ulcer/skin breakdown will heal within 14 weeks ANAISSA, MACFADDEN ([id number]) [phone].pdf Page 6 of 9 Date Initiated: 04/18/2022 Target Resolution Date: 08/17/2022 Goal Status: Active Interventions: Assess patient/caregiver ability to obtain necessary supplies Assess patient/caregiver ability to perform ulcer/skin care regimen upon admission and as needed Assess ulceration(s) every visit Notes: Electronic Signature(s) Signed: 04/25/2022 4:00:13 PM By: Carlene Coria RN Entered By: Carlene Coria on 04/25/2022 16:00:13 -------------------------------------------------------------------------------- Pain Assessment Details Patient Name: Date of Service: [name], [name]. 04/25/2022 3:45 PM Medical Record Number: [id number] Patient Account Number: [id number] Date of Birth/Sex: Treating RN: 02/14/1986 (37 y.o. Orvan Falconer Primary Care Jaidee Stipe: [name] Other Clinician: Referring Mosetta Ferdinand: Treating Siedah Sedor/Extender: [name] in Treatment: 1 Active Problems Location of Pain  Severity and Description of Pain Patient Has Paino Yes Site Locations With Dressing Change: Yes Duration of the Pain. Constant / Intermittento Constant Rate the pain. Current Pain Level: 3 Worst Pain Level: 9 Least Pain Level: 2 Tolerable Pain Level: 5 Character of Pain Describe the Pain: Burning Pain Management and Medication Current Pain Management: Medication: Yes Cold Application: No Rest: Yes Massage: No Activity: No T.E.N.S.: No Heat Application: No Leg drop or elevation: No Is the Current Pain Management Adequate: Inadequate How does your wound impact your activities of daily livingo Sleep: Yes Bathing: No Appetite: No Relationship With Others: No Bladder Continence: No Emotions: No FRAIDY, MCCARRICK ([id number]) [phone].pdf Page 7 of 9 Bowel Continence: No Work: No Toileting: No Drive: No Dressing: No Hobbies: No Engineer, maintenance) Signed: 04/25/2022 3:58:14 PM By: Carlene Coria RN Entered By: Carlene Coria on 04/25/2022 15:58:14 -------------------------------------------------------------------------------- Patient/Caregiver Education Details Patient Name: Date of Service: [name]. 1/10/2024andnbsp3:45 PM Medical Record Number:  [id number] Patient Account Number: [id number] Date of Birth/Gender: Treating RN: [birth year] (37 y.o. Orvan Falconer Primary Care Physician: [name] Other Clinician: Referring Physician: Treating Physician/Extender: [name] in Treatment: 1 Education Assessment Education Provided To: Patient Education Topics Provided Wound/Skin Impairment: Methods: Explain/Verbal Responses: State content correctly Electronic Signature(s) Signed: 04/26/2022 3:13:07 PM By: Carlene Coria RN Entered By: Carlene Coria on 04/25/2022 16:00:02 -------------------------------------------------------------------------------- Wound Assessment Details Patient Name: Date of  Service: [name], [name]. 04/25/2022 3:45 PM Medical Record Number: [id number] Patient Account Number: [id number] Date of Birth/Sex: Treating RN: December 04, 1985 (37 y.o. Orvan Falconer Primary Care Destony Prevost: [name] Other Clinician: Referring Kaileen Bronkema: Treating Timothy Trudell/Extender: [name] in Treatment: 1 Wound Status Wound Number: 5 Primary Etiology: Venous Leg Ulcer Wound Location: Left, Medial Lower Leg Wound Status: Open Wounding Event: Gradually Appeared Comorbid History: Hypertension, Peripheral Venous Disease [name], [name] ([id number]) [id number] Page 8 of 9 Date Acquired: 04/10/2022 Weeks Of Treatment: 1 Clustered Wound: No Photos Wound Measurements Length: (cm) 2 Width: (cm) 4 Depth: (cm) 0.3 Area: (cm) 6.283 Volume: (cm) 1.885 % Reduction in Area: -14.3% % Reduction in Volume: -71.4% Epithelialization: None Tunneling: No Undermining: No Wound Description Classification: Full Thickness Without Exposed Support Structures Exudate Amount: Medium Exudate Type: Serosanguineous Exudate Color: red, brown Foul Odor After Cleansing: No Slough/Fibrino Yes Wound Bed Granulation Amount: Small (1-33%) Exposed Structure Granulation Quality: Pink Fascia Exposed: No Necrotic Amount: Large (67-100%) Fat Layer (Subcutaneous Tissue) Exposed: Yes Necrotic Quality: Eschar, Adherent Slough Tendon Exposed: No Muscle Exposed: No Joint Exposed: No Bone Exposed: No Electronic Signature(s) Signed: 04/25/2022 3:58:50 PM By: Carlene Coria RN Entered By: Carlene Coria on 04/25/2022 15:58:50 -------------------------------------------------------------------------------- Vitals Details Patient Name: Date of Service: [name]. 04/25/2022 3:45 PM Medical Record Number: [id number] Patient Account Number: [id number] Date of Birth/Sex: Treating RN: 1986/01/14 (37 y.o. Orvan Falconer Primary Care [name] [place]: [name] Other Clinician: Referring Kinte Trim: Treating Velta Rockholt/Extender: [name] in Treatment: 1 Vital Signs Time Taken: 15:50 Temperature (F): 98.4 Height (in): 66 Pulse (bpm): 95 Weight (lbs): 400 Respiratory Rate (breaths/min): 18 Body Mass Index (BMI): 64.6 Blood Pressure (mmHg): 135/94 Reference Range: 80 - 120 mg / dl BRITTIE, WHISNANT ([id number]) [phone].pdf Page 9 of 9 Electronic Signature(s) Signed: 04/25/2022 3:57:41 PM By: Carlene Coria RN Entered By: Carlene Coria on 04/25/2022 15:57:41

## 2022-04-26 ENCOUNTER — Ambulatory Visit: Payer: BC Managed Care – PPO | Admitting: Physician Assistant

## 2022-04-26 NOTE — Progress Notes (Signed)
[name] ([id number]) [id number] Page 1 of 9 Visit Report for 04/25/2022 Chief Complaint Document Details Patient Name: Date of Service: [name], [name] [name]. 04/25/2022 3:45 PM Medical Record Number: [id number] Patient Account Number: [id number] Date of Birth/Sex: Treating RN: 06/02/1985 (37 y.o. [name]) [name] Primary Care Provider: [name], [name] Other Clinician: Referring Provider: Treating Provider/Extender: [name] in Treatment: 1 Information Obtained from: Patient Chief Complaint 04/18/2022; Left LE Ulcer Electronic Signature(s) Signed: 04/25/2022 4:40:21 PM By: [name] Entered By: [name] CorwinHoffman, Darion [name] on 04/25/2022 16:33:11 -------------------------------------------------------------------------------- HPI Details Patient Name: Date of Service: [name], [name]. 04/25/2022 3:45 PM Medical Record Number: [id number] Patient Account Number: [id number] Date of Birth/Sex: Treating RN: 10/31/1985 (37 y.o. [name]) [name] Primary Care Provider: [name], [name] Other Clinician: Referring Provider: Treating Provider/Extender: [name] DarlingHoffman, Brina [name] [name] [name] in Treatment: 1 History of Present Illness HPI Description: 37 year old patient who started with having ulcerations on the right lower leg on the lateral part of her ankle for about 2 [name]. [facility] and advised to see the wound care for a consultation. No X-rays of workup was done during the ER visit and no prescription for any medications of compression wraps were given. the patient is not diabetic but does have hypertension and her medications have been reviewed by me. 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 The right and left greater saphenous vein was  found to be tortuous. Deep venous system was also not competent and there was reflux of more than 500 ms. She was then seen by [name] who recommended that the patient would not benefit from endovenous ablation and he had recommended vein stripping odd on the right side and multiple small phlebectomy procedures on the left side. the patient did not follow-up due to social economic reasons. She has not been wearing any compression stockings and has not taken any specific treatment for varicose veins for the last 3 years. 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 We have obtained some appointments to see the vascular surgeons by the end of the month and the patient would like to follow up with me at [facility] on Wednesday, June 29. 10/14/2014 -- she could not see [facility] [place] [facility] and hence has come for a review today. She has a vascular work[facility] to be [facility] this [name], [name] ([id number]) [id number] Page 2 of 9 [place]. She is doing fine otherwise. 10/22/2014 -- she was seen by [name] and he has recommended surgical removal of her right saphenous vein from distal thigh to saphenofemoral odd junction and stab phlebectomy's of multiple large tributary branches throughout her thigh and calf. This would be done under general anesthesia in the outpatient setting. 10/29/2014 -- she is trying to work on a surgical date and in the meanwhile we have got insurance clearance for Apligraf and we will start this next week. 7/22 2016 -- she is here for the first application of Apligraf. 11/19/2014 -- she is here for a second application of Apligraf 11/26/2014 -- she has done fine after her last application of Apligraf and is awaiting her surgery which is scheduled for August 31.  12/03/2014 -- she is doing fine and is here for her third  application of Apligraf. 12/21/2014 -- She had surgery on 12/15/2014 by [facility] who did #1 ligation and stripping of right great saphenous vein from distal thigh to saphenofemoral junction, #2 stab phlebectomy of large tributary varicose veins in the thigh popliteal space and calf. She had an Ace wrap up to her groin and this was removed today and the Unna's boot was also removed. 12/28/2014 -- she is here for her fourth application of Apligraf. 01/06/2015 - he saw her vascular surgeon [name] who was pleased with her progress and he has confirmed that no surgical procedures could be attempted on the left side. 01/13/2015 -- her wound looks very good and she's been having no problems whatsoever. Readmission: 07/26/2020 upon evaluation today patient presents for initial inspection here in our clinic for a new issue with her left leg although she is previously been seen due to issues with the right leg back in 2016. At that time she was seeing [name] who is a vein/vascular specialist in [place]. He has since semiretired from what I understand. He is working out of [employer] I believe. Nonetheless she tells me at the time that there was really nothing to [name] for her left leg although the right leg was where they did most of the work. Subsequently she states that she is done fairly well until just in the past week where she had issues with bleeding from what appears to be varicose vein on the left leg medially. Unfortunately this has continued to be an issue although she tells me at first it was coming much more significantly Down quite a bit but nonetheless has not completely resolved. Every time she showers she notices that it starts to drain a little bit more. She does have a history of chronic venous insufficiency, lymphedema, varicose veins bilaterally, and obesity. 08/02/2020 upon evaluation today patient appears to be doing about the same in regards to the ulcer on her left leg. She has  some eschar covering there is definitely some fluid collecting underneath unfortunately. With that being said she tells me she is still having a tremendous amount of pain therefore she is really not able to allow me to clean this off very effectively to be perfectly honest. I think we need to try to soften this up 08/16/2020 upon evaluation today patient's wound is really not doing significantly better not really states about the same. She notes that the wrap just does not seem to be staying up very well at all unfortunately. No fevers, chills, nausea, vomiting, or diarrhea. She did cut it off once it starts to slide in order to alleviate some of the pressure from sliding Down. Fortunately there is no signs of active infection at this time which is great news. 08/23/2020 upon evaluation today patient appears to be doing well 08/23/2020 upon evaluation today patient appears to be doing well with regard to her wound all things considered. Fortunately there does not appear to be any signs of active infection at this time which is great news. She has been tolerating the dressing changes without complication and overall I am extremely pleased with where things stand at this point. She does have her appointment with vascular in [facility] on June 9. 08/30/2020 upon evaluation today patient actually appears to be doing decently well in regard to her wound. Fortunately there is no signs of active infection which is great news. Nonetheless I [name] believe that the  patient is going require little bit of debridement if she is okay with me attempting that today I think that will help clean off some of the necrotic tissue. Fortunately there does not appear to be otherwise any evidence of active infection which is also great news. 09/19/2020 upon evaluation today patient appears to be doing a little better in regard to her wound as compared to previous. Fortunately there does not appear to be any signs of active infection  overall. No fever chills noted. I [name] believe that the Iodosorb has made this a little bit better with regard to the overall size and appearance of the wound bed though again she does still have quite a ways to go to get this to heal she still very tender to touch. 09/27/2020 upon evaluation today patient appears to actually be doing quite well with regard to her wound. This is measuring much smaller which is great news. With that being said she did see vein and vascular in [facility] and they subsequently recommended that surgery is really what she probably needs to go forward with sounds like the potential for venous ablation. With that being said the patient tells me this is just not the right time for her to be able to proceed with any type of surgery which I completely understand. Nonetheless I [name] believe that she would continue to benefit from compression but again that is really not something that she is able to easily [name]. 10/04/2020 upon evaluation today patient appears to be doing about the same in regard to her wound. This is measuring a little bit smaller but nonetheless still is open and again has some slough and biofilm noted on the surface of the wound. There does not appear to be any signs of active infection which is great news. No fevers, chills, nausea, vomiting, or diarrhea. 10/04/2020 upon evaluation today patient appears to be doing well with regard to her wound. She has been tolerating dressing changes without complication. Fortunately there does not appear to be any signs of active infection which is great news. No fevers, chills, nausea, vomiting, or diarrhea. 10/25/2020 upon evaluation today patient appears to be doing well with regard to her wound. She has been tolerating the dressing changes without complication. Fortunately there is no signs of active infection at this time. No fevers, chills, nausea, vomiting, or diarrhea. 11/01/2020 upon evaluation today patient with regard to  her wound. She has been tolerating the dressing changes without complication. Fortunately there does not appear to be any signs of infection which is great news. No fever chills noted 11/15/2020 upon evaluation today patient appears to be doing well with regard to her wound. Fortunately there is no signs of active infection at this time. No fevers, chills, nausea, vomiting, or diarrhea. With that being said she continues to have a significant amount of pain at the site even though this is very close to complete closure. She also had several varicose veins around the area which were also problematic. Overall however I feel like the patient is making excellent progress. 11/28/2020 upon evaluation today patient appears to be doing well with regard to her leg ulcer. Again were not really able to debride or compression wrap her due to discomfort and pain. She does not allow for that. With that being said we have been using Iodosorb which does seem to be doing decently well. Fortunately there is no signs of active infection at this time which is great news. No fevers, chills, nausea, vomiting,  or diarrhea. 8/31; patient presents for 2-week follow-up. She has been using Iodosorb. She reports that the wound is closed. She denies signs of infection. Readmission: 10-27-2021 upon evaluation this is a patient that presents today whom I have previously seen this is pretty much for the same issue though I think a little bit higher than the last time I saw her. She does have a history of chronic venous insufficiency and hypertension along with varicose veins. Subsequently she does have an ulceration which spontaneously ruptured she has not been wearing any compression which I think is a big part of the issue here. We discussed this before I really think she probably needs to be wearing her compression therapy, she probably needs lymphedema pumps if she can ever wear the compression for a significant amount of time to  get these, and subsequently also think that she needs to be elevating her legs is much as possible she may even need some vascular intervention in regard to her veins. All of this was reiterated and discussed with her today to reinforce what needs to happen in order to ensure that her legs [name] not get a lot worse. The patient voiced understanding. She tells me that she knows because she is seeing her mom go through a lot of this as well how bad things can get. [name], [name] ([id number]) [id number] Page 3 of 9 11-03-2021 upon evaluation today patient appears to be doing well with regard to her wound. Fortunately there does not appear to be any signs of active infection at this time. She is measuring a little bit bigger but I think this is because the wound is actually cleaning up a bit here. 7/27; left lateral leg wound not any smaller but perhaps with a cleaner surface. She is using Iodoflex to help with the latter and using Tubigrip. She has chronic venous insufficiency with secondary lymphedema. She is apparently followed by vein and vascular and is being scheduled for an ablation 11-17-2021 upon evaluation today patient appears to be doing well with regard to her wound this is actually showing signs of improvement which is great news. Fortunately I [name] not see any evidence of active infection locally or systemically at this time which is great news. No fevers, chills, nausea, vomiting, or diarrhea. 11-24-2021 upon evaluation today patient's wound is actually showing signs of significant improvement. Unfortunately she had quite a bit of pain with debridement last week I [name] believe it was beneficial but at the same time she is doing much better but still really does not want this debrided again I think being that it is looking a whole lot better I would try to avoid that today especially since it caused her so much discomfort that is really not the goal and I explained that  to the patient today she voiced understanding and knows that it needed to be done but still states that it was quite painful. 11-30-2021 upon evaluation today patient appears to be doing excellent in regard to her wound this is actually showing signs of excellent improvement I am very pleased with where things stand. She does have her venous ablation appointment for October 17. 12-21-2021 upon evaluation today patient appears to be doing better in regard to her wound this is measuring smaller and looking better as well. Fortunately I [name] not see any signs of active infection locally or systemically which is great news. 01-01-2022 upon evaluation today patient appears to be doing well currently in regard to her wound.  She is showing signs of improvement which is great news and overall I [name] not see any signs of active infection locally or systemically at this time. 01-08-2022 upon evaluation today patient appears to be doing well currently in regard to her wound she is actually showing signs of significant improvement which is great news. Fortunately I [name] not see any evidence of active infection locally or systemically at this time. I [name] believe that we are on the right track. She also has her appointment October 19 for the venous ablation. 01-16-2022 upon evaluation today patient's wound actually is showing some signs of improvement although this is very slow. Fortunately I [name] not see any evidence of infection at this time. The volume is a little bit more although the size is smaller I think this is due to the fact that we are slowly cleaning this area out effectively. 11/6 continued improvement the patient is using Iodoflex and a Tubigrip E. She was supposed to have venous surgery at vein and vascular in [place] however somehow this is gotten delayed till December 14. 02-27-2022 upon evaluation today patient's wound is actually showing signs of being completely healed. Fortunately I [name] not see any  evidence of active infection locally or systemically which is great news and overall I am extremely pleased with where we stand today. Unfortunately she does tell me that she postpone her venous ablation surgery until December 14 she tells me that she was not ready "financially" for this. 04/18/2022; Ms. Yamilka Lopiccolo is a 37 year old female with a past medical history of venous insufficiency that presents the clinic for a left lower extremity wound. She was seen almost 2 months ago for the same wound. This had healed with Iodoflex and Tubigrip. She states that the wound recently reopened. She has seen vein and vascular and plan is for ligation and stripping of the left great saphenous vein. She is not sure when this procedure is going to be scheduled. She has canceled it once before. She has had office compression wraps in the past however these [name] not stay on and create more of an issue for her. She would like to avoid this. She currently denies signs of infection. 1/10; patient presents for follow-up. She has been using Iodoflex to the wound bed. She states she has been using her Tubigrip however she does not have this on today. She has no issues or complaints today. She denies signs of infection. Electronic Signature(s) Signed: 04/25/2022 4:40:21 PM By: [name] Corwin [name] Entered By: [name] Corwin on 04/25/2022 16:33:44 -------------------------------------------------------------------------------- Physical Exam Details Patient Name: Date of Service: [name] Moody MA [name]. 04/25/2022 3:45 PM Medical Record Number: [id number] Patient Account Number: [id number] Date of Birth/Sex: Treating RN: 1986/01/31 (37 y.o. [name] Finner Primary Care Provider: [name] Other Clinician: Referring Provider: Treating Provider/Extender: [name] Darling in Treatment: 1 Constitutional . Cardiovascular . Psychiatric . [name], [name] ([id number])  [id number] Page 4 of 9 Notes Left lower extremity: T the distal medial aspect there is an open wound with granulation tissue and nonviable tissue. No signs of infection including increased o warmth, erythema or purulent drainage. Electronic Signature(s) Signed: 04/25/2022 4:40:21 PM By: [name] Corwin [name] Entered By: [name] Corwin on 04/25/2022 16:34:09 -------------------------------------------------------------------------------- Physician Orders Details Patient Name: Date of Service: [name] Moody MA [name]. 04/25/2022 3:45 PM Medical Record Number: [id number] Patient Account Number: [id number] Date of Birth/Sex: Treating RN: [birth year] (37 y.o. [name] Finner Primary Care Provider: [name] Other Clinician: Referring Provider: Treating  Provider/Extender: [name] Darling in Treatment: 1 Verbal / Phone Orders: No Diagnosis Coding Follow-up Appointments Return Appointment in 1 week. Bathing/ Shower/ Hygiene May shower; gently cleanse wound with antibacterial soap, rinse and pat dry prior to dressing wounds Anesthetic (Use 'Patient Medications' Section for Anesthetic Order Entry) Lidocaine applied to wound bed Edema Control - Lymphedema / Segmental Compressive Device / Other Elevate, Exercise Daily and A void Standing for Long Periods of Time. Elevate legs to the level of the heart and pump ankles as often as possible Elevate leg(s) parallel to the floor when sitting. Wound Treatment Wound #5 - Lower Leg Wound Laterality: Left, Medial Cleanser: Byram Ancillary Kit - 15 Day Supply (Generic) 3 x Per Week/30 Days Discharge Instructions: Use supplies as instructed; Kit contains: (15) Saline Bullets; (15) 3x3 Gauze; 15 pr Gloves Prim Dressing: IODOFLEX 0.9% Cadexomer Iodine Pad 3 x Per Week/30 Days ary Discharge Instructions: Apply Iodoflex to wound bed only as directed. Secondary Dressing: (BORDER) Zetuvit Plus SILICONE  BORDER Dressing 4x4 (in/in) (Generic) 3 x Per Week/30 Days Discharge Instructions: Please [name] not put silicone bordered dressings under wraps. Use non-bordered dressing only. Secured With: Tubigrip Size F, 4x10 (in/yd) 3 x Per Week/30 Days Discharge Instructions: double layer Apply Tubigrip F 3 finger-widths below knee to base of toes to secure dressing and/or for swelling. Electronic Signature(s) Signed: 04/25/2022 4:40:21 PM By: [name] Corwin [name] Previous Signature: 04/25/2022 4:09:34 PM Version By: Yevonne Pax RN Entered By: [name] Corwin on 04/25/2022 16:36:03 Sisler, [name] ([id number]) [id number] Page 5 of 9 -------------------------------------------------------------------------------- Problem List Details Patient Name: Date of Service: [name], [name] [place] 04/25/2022 3:45 PM Medical Record Number: [id number] Patient Account Number: [id number] Date of Birth/Sex: Treating RN: 11/15/1985 (37 y.o. [name] Finner Primary Care Provider: [name] Other Clinician: Referring Provider: Treating Provider/Extender: [name] Darling in Treatment: 1 Active Problems ICD-10 Encounter Code Description Active Date MDM Diagnosis I87.312 Chronic venous hypertension (idiopathic) with ulcer of left lower extremity 04/18/2022 No Yes L97.822 Non-pressure chronic ulcer of other part of left lower leg with fat layer exposed1/06/2022 No Yes I89.0 Lymphedema, not elsewhere classified 04/18/2022 No Yes Inactive Problems Resolved Problems Electronic Signature(s) Signed: 04/25/2022 4:40:21 PM By: [name] Corwin [name] Entered By: [name] Corwin on 04/25/2022 16:33:08 -------------------------------------------------------------------------------- Progress Note Details Patient Name: Date of Service: [name] Moody MA [name]. 04/25/2022 3:45 PM Medical Record Number: [id number] Patient Account Number: [id number] Date of Birth/Sex: Treating RN: Mar 31, 1986  (37 y.o. [name] Finner Primary Care Provider: [name] Other Clinician: Referring Provider: Treating Provider/Extender: [name] Darling in Treatment: 1 Subjective Chief Complaint Information obtained from Patient [name], [name] ([id number]) [id number] Page 6 of 9 04/18/2022; Left LE Ulcer History of Present Illness (HPI) 37 year old patient who started with having ulcerations on the right lower leg on the lateral part of her ankle for about 2 [name]. She was seen in the ER at [facility] and advised to see the wound care for a consultation. No X-rays of workup was done during the ER visit and no prescription for any medications of compression wraps were given. the patient is not diabetic but does have hypertension and her medications have been reviewed by me. 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 The right and left greater saphenous vein was found to be tortuous.  Deep venous system was also not competent and there was reflux of more than 500 ms. She was then seen by Dr. Darene Lamer [name] who recommended that the patient would not benefit from endovenous ablation and he had recommended vein stripping odd on the right side and multiple small phlebectomy procedures on the left side. the patient did not follow-up due to social economic reasons. She has not been wearing any compression stockings and has not taken any specific treatment for varicose veins for the last 3 years. 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 We have obtained some appointments to see the vascular surgeons by the end of the month and the patient would like to follow up with me at my [facility]  on Wednesday, June 29. 10/14/2014 -- she could not see me yesterday in [place] and hence has come for a review today. She has a vascular workup to be done this afternoon at [facility]. She is doing fine otherwise. 10/22/2014 -- she was seen by Dr. Althea Charon and he has recommended surgical removal of her right saphenous vein from distal thigh to saphenofemoral odd junction and stab phlebectomy's of multiple large tributary branches throughout her thigh and calf. This would be done under general anesthesia in the outpatient setting. 10/29/2014 -- she is trying to work on a surgical date and in the meanwhile we have got insurance clearance for Apligraf and we will start this next week. 7/22 2016 -- she is here for the first application of Apligraf. 11/19/2014 -- she is here for a second application of Apligraf 11/26/2014 -- she has done fine after her last application of Apligraf and is awaiting her surgery which is scheduled for August 31. 12/03/2014 -- she is doing fine and is here for her third application of Apligraf. 12/21/2014 -- She had surgery on 12/15/2014 by AlejandraEarly who did #1 ligation and stripping of right great saphenous vein from distal thigh to saphenofemoral junction, #2 stab phlebectomy of large tributary varicose veins in the thigh popliteal space and calf. She had an Ace wrap up to her groin and this was removed today and the Unna's boot was also removed. 12/28/2014 -- she is here for her fourth application of Apligraf. 01/06/2015 - he saw her vascular surgeon Dr. Donnetta Hutching who was pleased with her progress and he has confirmed that no surgical procedures could be attempted on the left side. 01/13/2015 -- her wound looks very good and she's been having no problems whatsoever. Readmission: 07/26/2020 upon evaluation today patient presents for initial inspection here in our clinic for a new issue with her left leg although she is previously been seen due to issues with the right leg  back in 2016. At that time she was seeing Dr. Donnetta Hutching who is a vein/vascular specialist in [place]. He has since semiretired from what I understand. He is working out of [employer] I believe. Nonetheless she tells me at the time that there was really nothing to [name] for her left leg although the right leg was where they did most of the work. Subsequently she states that she is done fairly well until just in the past week where she had issues with bleeding from what appears to be varicose vein on the left leg medially. Unfortunately this has continued to be an issue although she tells me at first it was coming much more significantly Down quite a bit but nonetheless  has not completely resolved. Every time she showers she notices that it starts to drain a little bit more. She does have a history of chronic venous insufficiency, lymphedema, varicose veins bilaterally, and obesity. 08/02/2020 upon evaluation today patient appears to be doing about the same in regards to the ulcer on her left leg. She has some eschar covering there is definitely some fluid collecting underneath unfortunately. With that being said she tells me she is still having a tremendous amount of pain therefore she is really not able to allow me to clean this off very effectively to be perfectly honest. I think we need to try to soften this up 08/16/2020 upon evaluation today patient's wound is really not doing significantly better not really states about the same. She notes that the wrap just does not seem to be staying up very well at all unfortunately. No fevers, chills, nausea, vomiting, or diarrhea. She did cut it off once it starts to slide in order to alleviate some of the pressure from sliding Down. Fortunately there is no signs of active infection at this time which is great news. 08/23/2020 upon evaluation today patient appears to be doing well 08/23/2020 upon evaluation today patient appears to be doing well with regard to her  wound all things considered. Fortunately there does not appear to be any signs of active infection at this time which is great news. She has been tolerating the dressing changes without complication and overall I am extremely pleased with where things stand at this point. She does have [place] [facility] [place] [facility] [place] [facility]. 08/30/2020 upon evaluation today patient actually appears to be doing decently well in regard to her wound. Fortunately there is no signs of active infection which is great news. Nonetheless I [name] believe that the patient is going require little bit of debridement if she is okay with me attempting that today I think that will help clean off some of the necrotic tissue. Fortunately there does not appear to be otherwise any evidence of active infection which is also great news. 09/19/2020 upon evaluation today patient appears to be doing a little better in regard to her wound as compared to previous. Fortunately there does not appear to be any signs of active infection overall. No fever chills noted. I [name] believe that the Iodosorb has made this a little bit better with regard to the overall size and appearance of the wound bed though again she does still have quite a ways to go to get this to heal she still very tender to touch. 09/27/2020 upon evaluation today patient appears to actually be doing quite well with regard to her wound. This is measuring much smaller which is great news. With that being said she did [facility] and they subsequently recommended that surgery is really what she probably needs to go forward with sounds like the potential for venous ablation. With that being said the patient tells me this is just not the right time for her to be able to proceed with any type of surgery which I completely understand. Nonetheless I [name] believe that she would continue to benefit from compression but again that is really not something that she  is able to easily [name]. 10/04/2020 upon evaluation today patient appears to be doing about the same in regard to her wound. This is measuring a little bit smaller but nonetheless still is open and again has some slough and biofilm noted on the surface  of the wound. There does not appear to be any signs of active infection which is great news. No fevers, chills, nausea, vomiting, or diarrhea. 10/04/2020 upon evaluation today patient appears to be doing well with regard to her wound. She has been tolerating dressing changes without complication. Fortunately there does not appear to be any signs of active infection which is great news. No fevers, chills, nausea, vomiting, or diarrhea. 10/25/2020 upon evaluation today patient appears to be doing well with regard to her wound. She has been tolerating the dressing changes without complication. Fortunately there is no signs of active infection at this time. No fevers, chills, nausea, vomiting, or diarrhea. 11/01/2020 upon evaluation today patient with regard to her wound. She has been tolerating the dressing changes without complication. Fortunately there does not appear to be any signs of infection which is great news. No fever chills noted 11/15/2020 upon evaluation today patient appears to be doing well with regard to her wound. Fortunately there is no signs of active infection at this time. No [name], [name] ([id number]) [id number] Page 7 of 9 fevers, chills, nausea, vomiting, or diarrhea. With that being said she continues to have a significant amount of pain at the site even though this is very close to complete closure. She also had several varicose veins around the area which were also problematic. Overall however I feel like the patient is making excellent progress. 11/28/2020 upon evaluation today patient appears to be doing well with regard to her leg ulcer. Again were not really able to debride or compression wrap her due to  discomfort and pain. She does not allow for that. With that being said we have been using Iodosorb which does seem to be doing decently well. Fortunately there is no signs of active infection at this time which is great news. No fevers, chills, nausea, vomiting, or diarrhea. 8/31; patient presents for 2-week follow-up. She has been using Iodosorb. She reports that the wound is closed. She denies signs of infection. Readmission: 10-27-2021 upon evaluation this is a patient that presents today whom I have previously seen this is pretty much for the same issue though I think a little bit higher than the last time I saw her. She does have a history of chronic venous insufficiency and hypertension along with varicose veins. Subsequently she does have an ulceration which spontaneously ruptured she has not been wearing any compression which I think is a big part of the issue here. We discussed this before I really think she probably needs to be wearing her compression therapy, she probably needs lymphedema pumps if she can ever wear the compression for a significant amount of time to get these, and subsequently also think that she needs to be elevating her legs is much as possible she may even need some vascular intervention in regard to her veins. All of this was reiterated and discussed with her today to reinforce what needs to happen in order to ensure that her legs [name] not get a lot worse. The patient voiced understanding. She tells me that she knows because she is seeing her mom go through a lot of this as well how bad things can get. 11-03-2021 upon evaluation today patient appears to be doing well with regard to her wound. Fortunately there does not appear to be any signs of active infection at this time. She is measuring a little bit bigger but I think this is because the wound is actually cleaning up a bit here. 7/27; left  lateral leg wound not any smaller but perhaps with a cleaner surface. She is  using Iodoflex to help with the latter and using Tubigrip. She has chronic venous insufficiency with secondary lymphedema. She is apparently followed by vein and vascular and is being scheduled for an ablation 11-17-2021 upon evaluation today patient appears to be doing well with regard to her wound this is actually showing signs of improvement which is great news. Fortunately I [name] not see any evidence of active infection locally or systemically at this time which is great news. No fevers, chills, nausea, vomiting, or diarrhea. 11-24-2021 upon evaluation today patient's wound is actually showing signs of significant improvement. Unfortunately she had quite a bit of pain with debridement last week I [name] believe it was beneficial but at the same time she is doing much better but still really does not want this debrided again I think being that it is looking a whole lot better I would try to avoid that today especially since it caused her so much discomfort that is really not the goal and I explained that to the patient today she voiced understanding and knows that it needed to be done but still states that it was quite painful. 11-30-2021 upon evaluation today patient appears to be doing excellent in regard to her wound this is actually showing signs of excellent improvement I am very pleased with where things stand. She does have her venous ablation appointment for October 17. 12-21-2021 upon evaluation today patient appears to be doing better in regard to her wound this is measuring smaller and looking better as well. Fortunately I [name] not see any signs of active infection locally or systemically which is great news. 01-01-2022 upon evaluation today patient appears to be doing well currently in regard to her wound. She is showing signs of improvement which is great news and overall I [name] not see any signs of active infection locally or systemically at this time. 01-08-2022 upon evaluation today patient appears  to be doing well currently in regard to her wound she is actually showing signs of significant improvement which is great news. Fortunately I [name] not see any evidence of active infection locally or systemically at this time. I [name] believe that we are on the right track. She also has her appointment October 19 for the venous ablation. 01-16-2022 upon evaluation today patient's wound actually is showing some signs of improvement although this is very slow. Fortunately I [name] not see any evidence of infection at this time. The volume is a little bit more although the size is smaller I think this is due to the fact that we are slowly cleaning this area out effectively. 11/6 continued improvement the patient is using Iodoflex and a Tubigrip E. She was supposed to have venous surgery [place] [facility] [place] however somehow this is gotten delayed till December 14. 02-27-2022 upon evaluation today patient's wound is actually showing signs of being completely healed. Fortunately I [name] not see any evidence of active infection locally or systemically which is great news and overall I am extremely pleased with where we stand today. Unfortunately she does tell me that she postpone her venous ablation surgery until December 14 she tells me that she was not ready "financially" for this. 04/18/2022; [name] Sheeran is a 37 year old female with a past medical history of venous insufficiency that presents the clinic for a left lower extremity wound. She was seen almost 2 months ago for the same wound. This  had healed with Iodoflex and Tubigrip. She states that the wound recently reopened. She has seen vein and vascular and plan is for ligation and stripping of the left great saphenous vein. She is not sure when this procedure is going to be scheduled. She has canceled it once before. She has had office compression wraps in the past however these [name] not stay on and create more of an issue for her. She would like to  avoid this. She currently denies signs of infection. 1/10; patient presents for follow-up. She has been using Iodoflex to the wound bed. She states she has been using her Tubigrip however she does not have this on today. She has no issues or complaints today. She denies signs of infection. Objective Constitutional Vitals Time Taken: 3:50 PM, Height: 66 in, Weight: 400 lbs, BMI: 64.6, Temperature: 98.4 F, Pulse: 95 bpm, Respiratory Rate: 18 breaths/min, Blood Pressure: 135/94 mmHg. General Notes: Left lower extremity: T the distal medial aspect there is an open wound with granulation tissue and nonviable tissue. No signs of infection o including increased warmth, erythema or purulent drainage. Integumentary (Hair, Skin) Wound #5 status is Open. Original cause of wound was Gradually Appeared. The date acquired was: 04/10/2022. The wound has been in treatment 1 [name]. The wound is located on the Left,Medial Lower Leg. The wound measures 2cm length x 4cm width x 0.3cm depth; 6.283cm^2 area and 1.885cm^3 volume. There [name], [name] ([id number]) [phone].pdf Page 8 of 9 is Fat Layer (Subcutaneous Tissue) exposed. There is no tunneling or undermining noted. There is a medium amount of serosanguineous drainage noted. There is small (1-33%) pink granulation within the wound bed. There is a large (67-100%) amount of necrotic tissue within the wound bed including Eschar and Adherent Slough. Assessment Active Problems ICD-10 Chronic venous hypertension (idiopathic) with ulcer of left lower extremity Non-pressure chronic ulcer of other part of left lower leg with fat layer exposed Lymphedema, not elsewhere classified Patient's wound has more granulation tissue and less nonviable tissue today. No signs of obvious infection. Epithelization occurring in the wound bed. I recommended continuing Iodoflex and Tubigrip. Follow-up in 1 week. Plan Follow-up Appointments: Return  Appointment in 1 week. Bathing/ Shower/ Hygiene: May shower; gently cleanse wound with antibacterial soap, rinse and pat dry prior to dressing wounds Anesthetic (Use 'Patient Medications' Section for Anesthetic Order Entry): Lidocaine applied to wound bed Edema Control - Lymphedema / Segmental Compressive Device / Other: Elevate, Exercise Daily and Avoid Standing for Long Periods of Time. Elevate legs to the level of the heart and pump ankles as often as possible Elevate leg(s) parallel to the floor when sitting. WOUND #5: - Lower Leg Wound Laterality: Left, Medial Cleanser: Byram Ancillary Kit - 15 Day Supply (Generic) 3 x Per Week/30 Days Discharge Instructions: Use supplies as instructed; Kit contains: (15) Saline Bullets; (15) 3x3 Gauze; 15 pr Gloves Prim Dressing: IODOFLEX 0.9% Cadexomer Iodine Pad 3 x Per Week/30 Days ary Discharge Instructions: Apply Iodoflex to wound bed only as directed. Secondary Dressing: (BORDER) Zetuvit Plus SILICONE BORDER Dressing 4x4 (in/in) (Generic) 3 x Per Week/30 Days Discharge Instructions: Please [name] not put silicone bordered dressings under wraps. Use non-bordered dressing only. Secured With: Tubigrip Size F, 4x10 (in/yd) 3 x Per Week/30 Days Discharge Instructions: double layer Apply Tubigrip F 3 finger-widths below knee to base of toes to secure dressing and/or for swelling. 1. Iodoflex 2. Tubigrip 3. Follow-up in 1 week Electronic Signature(s) Signed: 04/25/2022 4:40:21 PM By: [name] Corwin [name] Entered  By: [name] Corwin on 04/25/2022 16:35:22 -------------------------------------------------------------------------------- SuperBill Details Patient Name: Date of Service: TIANE, SZYDLOWSKI MA [name]. 04/25/2022 Medical Record Number: [id number] Patient Account Number: [id number] Date of Birth/Sex: Treating RN: [birth year] (37 y.o. [name] Finner Primary Care Provider: [name] Other Clinician: Referring Provider: Treating Provider/Extender:  [name] Darling in Treatment: 1 Kole, [name] ([id number]) [id number] Page 9 of 9 Diagnosis Coding ICD-10 Codes Code Description I87.312 Chronic venous hypertension (idiopathic) with ulcer of left lower extremity L97.822 Non-pressure chronic ulcer of other part of left lower leg with fat layer exposed I89.0 Lymphedema, not elsewhere classified Physician Procedures : CPT4 Code Description Modifier [id number] - WC PHYS LEVEL 3 - EST PT ICD-10 Diagnosis Description I87.312 Chronic venous hypertension (idiopathic) with ulcer of left lower extremity L97.822 Non-pressure chronic ulcer of other part of left lower leg  with fat layer exposed I89.0 Lymphedema, not elsewhere classified Quantity: 1 Electronic Signature(s) Signed: 04/25/2022 4:40:21 PM By: [name] Corwin [name] Entered By: [name] Corwin on 04/25/2022 16:35:39

## 2022-05-02 ENCOUNTER — Encounter (HOSPITAL_BASED_OUTPATIENT_CLINIC_OR_DEPARTMENT_OTHER): Payer: BC Managed Care – PPO | Admitting: Internal Medicine

## 2022-05-02 ENCOUNTER — Other Ambulatory Visit
Admission: RE | Admit: 2022-05-02 | Discharge: 2022-05-02 | Disposition: A | Discharge status: Home / Self Care | Payer: BC Managed Care – PPO | Source: Ambulatory Visit | Attending: Internal Medicine | Admitting: Internal Medicine

## 2022-05-02 DIAGNOSIS — I890 Lymphedema, not elsewhere classified: Secondary | ICD-10-CM

## 2022-05-02 DIAGNOSIS — I87312 Chronic venous hypertension (idiopathic) with ulcer of left lower extremity: Secondary | ICD-10-CM

## 2022-05-02 DIAGNOSIS — B999 Unspecified infectious disease: Secondary | ICD-10-CM | POA: Diagnosis present

## 2022-05-02 DIAGNOSIS — L97822 Non-pressure chronic ulcer of other part of left lower leg with fat layer exposed: Secondary | ICD-10-CM | POA: Diagnosis not present

## 2022-05-02 NOTE — Progress Notes (Addendum)
[name], [name] ([id number]) [id number] Page 1 of 10 Visit Report for 05/02/2022 Arrival Information Details Patient Name: Date of Service: [name], [name] [name]. 05/02/2022 9:00 A M Medical Record Number: [id number] Patient Account Number: [id number] Date of Birth/Sex: Treating RN: 12/19/1985 (37 y.o. [name] Primary Care [name]: [name] Other Clinician: Referring [name]: Treating [name]: [name] in Treatment: 2 Visit Information History Since Last Visit Added or deleted any medications: No Patient Arrived: Ambulatory Any new allergies or adverse reactions: No Arrival Time: 09:06 Had a fall or experienced change in No Accompanied By: self activities of daily living that may affect Transfer Assistance: None risk of falls: Patient Identification Verified: Yes Signs or symptoms of abuse/neglect since last visito No Secondary Verification Process Completed: Yes Hospitalized since last visit: No Patient Requires Transmission-Based Precautions: No Implantable device outside of the clinic excluding No Patient Has Alerts: No cellular tissue based products placed in the center since last visit: Has Dressing in Place as Prescribed: Yes Has Compression in Place as Prescribed: Yes Pain Present Now: Yes Electronic Signature(s) Signed: 05/02/2022 9:20:12 AM By: Yevonne Pax RN Entered By: Yevonne Pax on 05/02/2022 09:20:12 -------------------------------------------------------------------------------- Clinic Level of Care Assessment Details Patient Name: Date of Service: [name] [name]. 05/02/2022 9:00 A M Medical Record Number: [id number] Patient Account Number: [id number] Date of Birth/Sex: Treating RN: 1985-06-25 (37 y.o. Freddy Finner Primary Care Blessed Cotham: [name] Other Clinician: Referring Trampas Stettner: Treating Taquila Leys/Extender: [name] in Treatment:  2 Clinic Level of Care Assessment Items TOOL 4 Quantity Score X- 1 0 Use when only an EandM is performed on FOLLOW-UP visit ASSESSMENTS - Nursing Assessment / Reassessment X- 1 10 Reassessment of Co-morbidities (includes updates in patient status) X- 1 5 Reassessment of Adherence to Treatment Plan [name], [name] ([id number]) [phone].pdf Page 2 of 10 ASSESSMENTS - Wound and Skin A ssessment / Reassessment X - Simple Wound Assessment / Reassessment - one wound 1 5 []  - 0 Complex Wound Assessment / Reassessment - multiple wounds []  - 0 Dermatologic / Skin Assessment (not related to wound area) ASSESSMENTS - Focused Assessment []  - 0 Circumferential Edema Measurements - multi extremities []  - 0 Nutritional Assessment / Counseling / Intervention []  - 0 Lower Extremity Assessment (monofilament, tuning fork, pulses) []  - 0 Peripheral Arterial Disease Assessment (using hand held doppler) ASSESSMENTS - Ostomy and/or Continence Assessment and Care []  - 0 Incontinence Assessment and Management []  - 0 Ostomy Care Assessment and Management (repouching, etc.) PROCESS - Coordination of Care X - Simple Patient / Family Education for ongoing care 1 15 []  - 0 Complex (extensive) Patient / Family Education for ongoing care []  - 0 Staff obtains , Records, T Results / Process Orders est []  - 0 Staff telephones HHA, Nursing Homes / Clarify orders / etc []  - 0 Routine Transfer to another Facility (non-emergent condition) []  - 0 Routine Hospital Admission (non-emergent condition) []  - 0 New Admissions / / Ordering NPWT Apligraf, etc. , []  - 0 Emergency Hospital Admission (emergent condition) X- 1 10 Simple Discharge Coordination []  - 0 Complex (extensive) Discharge Coordination PROCESS - Special Needs []  - 0 Pediatric / Minor Patient Management []  - 0 Isolation Patient Management []  - 0 Hearing / Language / Visual special  needs []  - 0 Assessment of Community assistance (transportation, D/C planning, etc.) []  - 0 Additional assistance / Altered mentation []  - 0 Support Surface(s) Assessment (bed, cushion, seat, etc.) INTERVENTIONS - Wound  Cleansing / Measurement X - Simple Wound Cleansing - one wound 1 5 []  - 0 Complex Wound Cleansing - multiple wounds X- 1 5 Wound Imaging (photographs - any number of wounds) []  - 0 Wound Tracing (instead of photographs) X- 1 5 Simple Wound Measurement - one wound []  - 0 Complex Wound Measurement - multiple wounds INTERVENTIONS - Wound Dressings X - Small Wound Dressing one or multiple wounds 1 10 []  - 0 Medium Wound Dressing one or multiple wounds []  - 0 Large Wound Dressing one or multiple wounds []  - 0 Application of Medications - topical []  - 0 Application of Medications - injection INTERVENTIONS - Miscellaneous []  - 0 External ear exam [name], [name] ([id number]) [id number] Page 3 of 10 []  - 0 Specimen Collection (cultures, biopsies, blood, body fluids, etc.) []  - 0 Specimen(s) / Culture(s) sent or taken to Lab for analysis []  - 0 Patient Transfer (multiple staff / [name] Lift / Similar devices) []  - 0 Simple Staple / Suture removal (25 or less) []  - 0 Complex Staple / Suture removal (26 or more) []  - 0 Hypo / Hyperglycemic Management (close monitor of Blood Glucose) []  - 0 Ankle / Brachial Index (ABI) - do not check if billed separately X- 1 5 Vital Signs Has the patient been seen at the hospital within the last three years: Yes Total Score: 75 Level Of Care: New/Established - Level 2 Electronic Signature(s) Unsigned Previous Signature: 05/02/2022 4:29:22 PM Version By: Carlene Coria RN Entered By: Carlene Coria on 05/02/2022 16:31:01 -------------------------------------------------------------------------------- Encounter Discharge Information Details Patient Name: Date of Service: [name]. 05/02/2022  9:00 A M Medical Record Number: [id number] Patient Account Number: [id number] Date of Birth/Sex: Treating RN: December 17, 1985 (37 y.o. Orvan Falconer Primary Care Addalee Kavanagh: [name] Other Clinician: Referring Fransico Sciandra: Treating Raiana Pharris/Extender: [name] in Treatment: 2 Encounter Discharge Information Items Discharge Condition: Stable Ambulatory Status: Ambulatory Discharge Destination: Home Transportation: Private Auto Accompanied By: self Schedule Follow-up Appointment: Yes Clinical Summary of Care: Electronic Signature(s) Signed: 05/02/2022 11:29:40 AM By: Carlene Coria RN Previous Signature: 05/02/2022 10:08:21 AM Version By: Carlene Coria RN Entered By: Carlene Coria on 05/02/2022 11:29:39 [name] ([id number]) [id number] Page 4 of 10 -------------------------------------------------------------------------------- Lower Extremity Assessment Details Patient Name: Date of Service: [name], [name] [place] [name]. 05/02/2022 9:00 A M Medical Record Number: [id number] Patient Account Number: [id number] Date of Birth/Sex: Treating RN: 1986/03/18 (37 y.o. Orvan Falconer Primary Care Rikki Trosper: [name] Other Clinician: Referring Sharunda Salmon: Treating Square Jowett/Extender: [name] in Treatment: 2 Edema Assessment Assessed: Shirlyn Goltz: No] [Right: No] Edema: [Left: Ye] [Right: s] Calf Left: Right: Point of Measurement: 32 cm From Medial Instep 65 cm Ankle Left: Right: Point of Measurement: 12 cm From Medial Instep 34 cm Vascular Assessment Pulses: Dorsalis Pedis Palpable: [Left:Yes] Electronic Signature(s) Signed: 05/02/2022 9:24:10 AM By: Carlene Coria RN Entered By: Carlene Coria on 05/02/2022 09:24:10 -------------------------------------------------------------------------------- Multi Wound Chart Details Patient Name: Date of Service: [name]. 05/02/2022 9:00 A M Medical Record  Number: [id number] Patient Account Number: [id number] Date of Birth/Sex: Treating RN: 09-23-1985 (36 y.o. Orvan Falconer Primary Care Tattiana Fakhouri: [name] Other Clinician: Referring Debany Vantol: Treating Johnathyn Viscomi/Extender: [name] in Treatment: 2 Vital Signs Height(in): 66 Pulse(bpm): 109 Weight(lbs): 400 Blood Pressure(mmHg): 138/96 Body Mass Index(BMI): 64.6 Temperature(F): 98.2 Respiratory Rate(breaths/min): [address] Khaleah [name] ([id number]) [phone].pdf Page 5 of 10 [5:Photos:] [N/A:N/A] Left, Medial Lower Leg N/A N/A Wound Location: Gradually Appeared N/A  N/A Wounding Event: Venous Leg Ulcer N/A N/A Primary Etiology: Hypertension, Peripheral Venous N/A N/A Comorbid History: Disease 04/10/2022 N/A N/A Date Acquired: 2 N/A N/A Weeks of Treatment: Open N/A N/A Wound Status: No N/A N/A Wound Recurrence: 2.5x4.5x0.5 N/A N/A Measurements L x W x D (cm) 8.836 N/A N/A A (cm) : rea 4.418 N/A N/A Volume (cm) : -60.70% N/A N/A % Reduction in Area: -301.60% N/A N/A % Reduction in Volume: Full Thickness Without Exposed N/A N/A Classification: Support Structures Medium N/A N/A Exudate A mount: Serosanguineous N/A N/A Exudate Type: red, brown N/A N/A Exudate Color: None Present (0%) N/A N/A Granulation Amount: Large (67-100%) N/A N/A Necrotic Amount: Eschar, Adherent Slough N/A N/A Necrotic Tissue: Fat Layer (Subcutaneous Tissue): Yes N/A N/A Exposed Structures: Fascia: No Tendon: No Muscle: No Joint: No Bone: No None N/A N/A Epithelialization: Treatment Notes Electronic Signature(s) Signed: 05/02/2022 9:24:48 AM By: Yevonne Pax RN Entered By: Yevonne Pax on 05/02/2022 09:24:48 -------------------------------------------------------------------------------- Multi-Disciplinary Care Plan Details Patient Name: Date of Service: [name] Moody [place] [name]. 05/02/2022 9:00 A M Medical Record Number:  [id number] Patient Account Number: [id number] Date of Birth/Sex: Treating RN: 06/02/1985 (37 y.o. Freddy Finner Primary Care Noam Franzen: [name] Other Clinician: Referring Emani Taussig: Treating Travious Vanover/Extender: [name] in Treatment: 2 Active Inactive Necrotic Tissue Nursing Diagnoses: JAMILETT, [name] ([id number]) [phone].pdf Page 6 of 10 Knowledge deficit related to management of necrotic/devitalized tissue Goals: Patient/caregiver will verbalize understanding of reason and process for debridement of necrotic tissue Date Initiated: 04/18/2022 Target Resolution Date: 05/19/2022 Goal Status: Active Interventions: Assess patient pain level pre-, during and post procedure and prior to discharge Provide education on necrotic tissue and debridement process Notes: Venous Leg Ulcer Nursing Diagnoses: Knowledge deficit related to disease process and management Goals: Patient will maintain optimal edema control Date Initiated: 04/18/2022 Target Resolution Date: 05/19/2022 Goal Status: Active Interventions: Assess peripheral edema status every visit. Compression as ordered Notes: Wound/Skin Impairment Nursing Diagnoses: Knowledge deficit related to ulceration/compromised skin integrity Goals: Patient/caregiver will verbalize understanding of skin care regimen Date Initiated: 04/18/2022 Target Resolution Date: 05/19/2022 Goal Status: Active Ulcer/skin breakdown will have a volume reduction of 30% by week 4 Date Initiated: 04/18/2022 Target Resolution Date: 05/19/2022 Goal Status: Active Ulcer/skin breakdown will have a volume reduction of 50% by week 8 Date Initiated: 04/18/2022 Target Resolution Date: 06/18/2022 Goal Status: Active Ulcer/skin breakdown will have a volume reduction of 80% by week 12 Date Initiated: 04/18/2022 Target Resolution Date: 07/18/2022 Goal Status: Active Ulcer/skin breakdown will heal within 14 weeks Date  Initiated: 04/18/2022 Target Resolution Date: 08/17/2022 Goal Status: Active Interventions: Assess patient/caregiver ability to obtain necessary supplies Assess patient/caregiver ability to perform ulcer/skin care regimen upon admission and as needed Assess ulceration(s) every visit Notes: Electronic Signature(s) Signed: 05/02/2022 9:25:18 AM By: Yevonne Pax RN Entered By: Yevonne Pax on 05/02/2022 09:25:18 Brostrom, [name] ([id number]) [id number] Page 7 of 10 -------------------------------------------------------------------------------- Pain Assessment Details Patient Name: Date of Service: [name], [name] [name]. 05/02/2022 9:00 A M Medical Record Number: [id number] Patient Account Number: [id number] Date of Birth/Sex: Treating RN: 1985/08/12 (37 y.o. Freddy Finner Primary Care Anmarie Fukushima: [name] Other Clinician: Referring Ahsan Esterline: Treating Tondra Reierson/Extender: [name] in Treatment: 2 Active Problems Location of Pain Severity and Description of Pain Patient Has Paino Yes Site Locations With Dressing Change: Yes Duration of the Pain. Constant / Intermittento Constant Rate the pain. Current Pain Level: 8 Worst Pain Level: 10 Least Pain Level: 3 Tolerable Pain Level: 5  Character of Pain Describe the Pain: Aching, Burning Pain Management and Medication Current Pain Management: Medication: Yes Cold Application: No Rest: Yes Massage: No Activity: No T.E.N.S.: No Heat Application: No Leg drop or elevation: No Is the Current Pain Management Adequate: Inadequate How does your wound impact your activities of daily livingo Sleep: Yes Bathing: No Appetite: No Relationship With Others: No Bladder Continence: No Emotions: No Bowel Continence: No Work: No Toileting: No Drive: No Dressing: No Hobbies: No Electronic Signature(s) Signed: 05/02/2022 9:21:21 AM By: Yevonne Pax RN Previous Signature: 05/02/2022  9:20:50 AM Version By: Yevonne Pax RN Entered By: Yevonne Pax on 05/02/2022 09:21:20 -------------------------------------------------------------------------------- Patient/Caregiver Education Details Patient Name: Date of Service: [name] Moody [place] [name]. 1/17/2024andnbsp9:00 A M Medical Record Number: [id number] Patient Account Number: [id number] [name], [name] ([id number]) [id number] Page 8 of 10 Date of Birth/Gender: Treating RN: February 10, 1986 (37 y.o. Freddy Finner Primary Care Physician: [name] Other Clinician: Referring Physician: Treating Physician/Extender: [name] in Treatment: 2 Education Assessment Education Provided To: Patient Education Topics Provided Wound/Skin Impairment: Methods: Explain/Verbal Responses: State content correctly Electronic Signature(s) Signed: 05/02/2022 4:29:22 PM By: Yevonne Pax RN Entered By: Yevonne Pax on 05/02/2022 09:25:11 -------------------------------------------------------------------------------- Wound Assessment Details Patient Name: Date of Service: [name] Moody [place] [name]. 05/02/2022 9:00 A M Medical Record Number: [id number] Patient Account Number: [id number] Date of Birth/Sex: Treating RN: 1985-10-29 (37 y.o. Freddy Finner Primary Care Cottrell Gentles: [name] Other Clinician: Referring Cortne Amara: Treating Kristyana Notte/Extender: [name] in Treatment: 2 Wound Status Wound Number: 5 Primary Etiology: Venous Leg Ulcer Wound Location: Left, Medial Lower Leg Wound Status: Open Wounding Event: Gradually Appeared Comorbid History: Hypertension, Peripheral Venous Disease Date Acquired: 04/10/2022 Weeks Of Treatment: 2 Clustered Wound: No Photos Wound Measurements Length: (cm) 2.5 Width: (cm) 4.5 Depth: (cm) 0.5 Area: (cm) 8.83 Volume: (cm) 4.41 [name], [name] ([id number]) Wound Description Classification: Full Thickness Without  Exposed Suppor Exudate Amount: Medium Exudate Type: Serosanguineous Exudate Color: red, brown Foul Odor After Cleansing: Slough/Fibrino % Reduction in Area: -60.7% % Reduction in Volume: -301.6% Epithelialization: None 6 Tunneling: No 8 Undermining: No [id number] Page 9 of 10 t Structures No Yes Wound Bed Granulation Amount: None Present (0%) Exposed Structure Necrotic Amount: Large (67-100%) Fascia Exposed: No Necrotic Quality: Eschar, Adherent Slough Fat Layer (Subcutaneous Tissue) Exposed: Yes Tendon Exposed: No Muscle Exposed: No Joint Exposed: No Bone Exposed: No Treatment Notes Wound #5 (Lower Leg) Wound Laterality: Left, Medial Cleanser Byram Ancillary Kit - 15 Day Supply Discharge Instruction: Use supplies as instructed; Kit contains: (15) Saline Bullets; (15) 3x3 Gauze; 15 pr Gloves Peri-Wound Care Topical Primary Dressing Hydrofera Blue Ready Transfer Foam, 2.5x2.5 (in/in) Discharge Instruction: Apply Hydrofera Blue Ready to wound bed as directed Secondary Dressing (BORDER) Zetuvit Plus SILICONE BORDER Dressing 4x4 (in/in) Discharge Instruction: Please do not put silicone bordered dressings under wraps. Use non-bordered dressing only. Secured With Tubigrip Size F, 4x10 (in/yd) Discharge Instruction: double layer Apply Tubigrip F 3 finger-widths below knee to base of toes to secure dressing and/or for swelling. Compression Wrap Compression Stockings Add-Ons Electronic Signature(s) Signed: 05/02/2022 9:24:38 AM By: Yevonne Pax RN Previous Signature: 05/02/2022 9:22:30 AM Version By: Yevonne Pax RN Entered By: Yevonne Pax on 05/02/2022 09:24:38 -------------------------------------------------------------------------------- Vitals Details Patient Name: Date of Service: [name] Moody [place] [name]. 05/02/2022 9:00 A M Medical Record Number: [id number] Patient Account Number: [id number] Date of Birth/Sex: Treating RN: 06-12-1985 (37 y.o. Freddy Finner Primary Care Carlton Buskey: [name] Other Clinician: Referring Rosaleah Person:  Treating Letoya Stallone/Extender: [name] in Treatment: 2 Vital Signs Time Taken: 09:15 Temperature (F): 98.2 [name], [name] ([id number]) [phone].pdf Page 10 of 10 Height (in): 66 Pulse (bpm): 109 Weight (lbs): 400 Respiratory Rate (breaths/min): 18 Body Mass Index (BMI): 64.6 Blood Pressure (mmHg): 138/96 Reference Range: 80 - 120 mg / dl Electronic Signature(s) Signed: 05/02/2022 9:20:41 AM By: Carlene Coria RN Entered By: Carlene Coria on 05/02/2022 09:20:40

## 2022-05-02 NOTE — Progress Notes (Addendum)
[name], [name] ([id number]) [id number] Page 1 of 9 Visit Report for 05/02/2022 Chief Complaint Document Details Patient Name: Date of Service: [name], [name] [place] [name]. 05/02/2022 9:00 A M Medical Record Number: [id number] Patient Account Number: [id number] Date of Birth/Sex: Treating RN: 12/24/1985 (37 y.o. [name] FinnerF) Epps, [name] Primary Care Provider: [name], [name] Other Clinician: Referring Provider: Treating Provider/Extender: Emeline DarlingHoffman, Vikas Wegmann Quillen, [name] Weeks in Treatment: 2 Information Obtained from: Patient Chief Complaint 04/18/2022; Left LE Ulcer Electronic Signature(s) Signed: 05/02/2022 10:02:09 AM By: Geralyn CorwinHoffman, Peola Joynt DO Entered By: Geralyn CorwinHoffman, Aleyah Balik on 05/02/2022 09:50:15 -------------------------------------------------------------------------------- HPI Details Patient Name: Date of Service: [name], SHA MA [name]. 05/02/2022 9:00 A M Medical Record Number: [id number] Patient Account Number: [id number] Date of Birth/Sex: Treating RN: 09/04/1985 (37 y.o. [name] FinnerF) Epps, [name] Primary Care Provider: [name], [name] Other Clinician: Referring Provider: Treating Provider/Extender: Emeline DarlingHoffman, Roanna Reaves Quillen, [name] Weeks in Treatment: 2 History of Present Illness HPI Description: 37 year old patient who started with having ulcerations on the right lower leg on the lateral part of her ankle for about 2 weeks. [facility] and advised to see the wound care for a consultation. No X-rays of workup was done during the ER visit and no prescription for any medications of compression wraps were given. the patient is not diabetic but does have hypertension and her medications have been reviewed by me. 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 The right and left greater saphenous vein  was found to be tortuous. Deep venous system was also not competent and there was reflux of more than 500 ms. She was then seen by Dr. Karie Schwalbe Early who recommended that the patient would not benefit from endovenous ablation and he had recommended vein stripping odd on the right side and multiple small phlebectomy procedures on the left side. the patient did not follow-up due to social economic reasons. She has not been wearing any compression stockings and has not taken any specific treatment for varicose veins for the last 3 years. 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 We have obtained some appointments to see the vascular surgeons by the end of the month and the patient would like to follow up with me at [facility] on Wednesday, June 29. 10/14/2014 -- [place] and hence has come for a review today. She has a vascular workup to be done this afternoon at [name], [name] ([id number]) [id number] Page 2 of 9 [place]. She is doing fine otherwise. 10/22/2014 -- she was seen by Dr. Brantley Fling Early and he has recommended surgical removal of her right saphenous vein from distal thigh to saphenofemoral odd junction and stab phlebectomy's of multiple large tributary branches throughout her thigh and calf. This would be done under general anesthesia in the outpatient setting. 10/29/2014 -- she is trying to work on a surgical date and in the meanwhile we have got insurance clearance for Apligraf and we will start this next week. 7/22 2016 -- she is here for the first application of Apligraf. 11/19/2014 -- she is here for a second application of Apligraf 11/26/2014 -- she has done fine after her last application of Apligraf and is awaiting her surgery which is scheduled for  August 31. 12/03/2014 -- she is doing fine and is here for her  third application of Apligraf. 12/21/2014 -- She had surgery on 12/15/2014 by Dr.Early who did #1 ligation and stripping of right great saphenous vein from distal thigh to saphenofemoral junction, #2 stab phlebectomy of large tributary varicose veins in the thigh popliteal space and calf. She had an Ace wrap up to her groin and this was removed today and the Unna's boot was also removed. 12/28/2014 -- she is here for her fourth application of Apligraf. 01/06/2015 - he saw her vascular surgeon Dr. Arbie Cookey who was pleased with her progress and he has confirmed that no surgical procedures could be attempted on the left side. 01/13/2015 -- her wound looks very good and she's been having no problems whatsoever. Readmission: 07/26/2020 upon evaluation today patient presents for initial inspection here in our clinic for a new issue with her left leg although she is previously been seen due to issues with the right leg back in 2016. At that time she was seeing Dr. Arbie Cookey who is a vein/vascular specialist in [place]. He has since semiretired from what I understand. He is working out of [employer] I believe. Nonetheless she tells me at the time that there was really nothing to do for her left leg although the right leg was where they did most of the work. Subsequently she states that she is done fairly well until just in the past week where she had issues with bleeding from what appears to be varicose vein on the left leg medially. Unfortunately this has continued to be an issue although she tells me at first it was coming much more significantly Down quite a bit but nonetheless has not completely resolved. Every time she showers she notices that it starts to drain a little bit more. She does have a history of chronic venous insufficiency, lymphedema, varicose veins bilaterally, and obesity. 08/02/2020 upon evaluation today patient appears to be doing about the same in regards to the ulcer on her left leg. She  has some eschar covering there is definitely some fluid collecting underneath unfortunately. With that being said she tells me she is still having a tremendous amount of pain therefore she is really not able to allow me to clean this off very effectively to be perfectly honest. I think we need to try to soften this up 08/16/2020 upon evaluation today patient's wound is really not doing significantly better not really states about the same. She notes that the wrap just does not seem to be staying up very well at all unfortunately. No fevers, chills, nausea, vomiting, or diarrhea. She did cut it off once it starts to slide in order to alleviate some of the pressure from sliding Down. Fortunately there is no signs of active infection at this time which is great news. 08/23/2020 upon evaluation today patient appears to be doing well 08/23/2020 upon evaluation today patient appears to be doing well with regard to her wound all things considered. Fortunately there does not appear to be any signs of active infection at this time which is great news. She has been tolerating the dressing changes without complication and overall I am extremely pleased with where things stand at this point. She does have her appointment with vascular in [facility] on June 9. 08/30/2020 upon evaluation today patient actually appears to be doing decently well in regard to her wound. Fortunately there is no signs of active infection which is great news. Nonetheless I do believe  that the patient is going require little bit of debridement if she is okay with me attempting that today I think that will help clean off some of the necrotic tissue. Fortunately there does not appear to be otherwise any evidence of active infection which is also great news. 09/19/2020 upon evaluation today patient appears to be doing a little better in regard to her wound as compared to previous. Fortunately there does not appear to be any signs of active infection  overall. No fever chills noted. I do believe that the Iodosorb has made this a little bit better with regard to the overall size and appearance of the wound bed though again she does still have quite a ways to go to get this to heal she still very tender to touch. 09/27/2020 upon evaluation today patient appears to actually be doing quite well with regard to her wound. This is measuring much smaller which is great news. With that being said she did see vein and vascular in [facility] and they subsequently recommended that surgery is really what she probably needs to go forward with sounds like the potential for venous ablation. With that being said the patient tells me this is just not the right time for her to be able to proceed with any type of surgery which I completely understand. Nonetheless I do believe that she would continue to benefit from compression but again that is really not something that she is able to easily do. 10/04/2020 upon evaluation today patient appears to be doing about the same in regard to her wound. This is measuring a little bit smaller but nonetheless still is open and again has some slough and biofilm noted on the surface of the wound. There does not appear to be any signs of active infection which is great news. No fevers, chills, nausea, vomiting, or diarrhea. 10/04/2020 upon evaluation today patient appears to be doing well with regard to her wound. She has been tolerating dressing changes without complication. Fortunately there does not appear to be any signs of active infection which is great news. No fevers, chills, nausea, vomiting, or diarrhea. 10/25/2020 upon evaluation today patient appears to be doing well with regard to her wound. She has been tolerating the dressing changes without complication. Fortunately there is no signs of active infection at this time. No fevers, chills, nausea, vomiting, or diarrhea. 11/01/2020 upon evaluation today patient with regard to  her wound. She has been tolerating the dressing changes without complication. Fortunately there does not appear to be any signs of infection which is great news. No fever chills noted 11/15/2020 upon evaluation today patient appears to be doing well with regard to her wound. Fortunately there is no signs of active infection at this time. No fevers, chills, nausea, vomiting, or diarrhea. With that being said she continues to have a significant amount of pain at the site even though this is very close to complete closure. She also had several varicose veins around the area which were also problematic. Overall however I feel like the patient is making excellent progress. 11/28/2020 upon evaluation today patient appears to be doing well with regard to her leg ulcer. Again were not really able to debride or compression wrap her due to discomfort and pain. She does not allow for that. With that being said we have been using Iodosorb which does seem to be doing decently well. Fortunately there is no signs of active infection at this time which is great news. No fevers, chills,  nausea, vomiting, or diarrhea. 8/31; patient presents for 2-week follow-up. She has been using Iodosorb. She reports that the wound is closed. She denies signs of infection. Readmission: 10-27-2021 upon evaluation this is a patient that presents today whom I have previously seen this is pretty much for the same issue though I think a little bit higher than the last time I saw her. She does have a history of chronic venous insufficiency and hypertension along with varicose veins. Subsequently she does have an ulceration which spontaneously ruptured she has not been wearing any compression which I think is a big part of the issue here. We discussed this before I really think she probably needs to be wearing her compression therapy, she probably needs lymphedema pumps if she can ever wear the compression for a significant amount of time to  get these, and subsequently also think that she needs to be elevating her legs is much as possible she may even need some vascular intervention in regard to her veins. All of this was reiterated and discussed with her today to reinforce what needs to happen in order to ensure that her legs do not get a lot worse. The patient voiced understanding. She tells me that she knows because she is seeing her mom go through a lot of this as well how bad things can get. [name], Alyzabeth [name] ([id number]) [id number] Page 3 of 9 11-03-2021 upon evaluation today patient appears to be doing well with regard to her wound. Fortunately there does not appear to be any signs of active infection at this time. She is measuring a little bit bigger but I think this is because the wound is actually cleaning up a bit here. 7/27; left lateral leg wound not any smaller but perhaps with a cleaner surface. She is using Iodoflex to help with the latter and using Tubigrip. She has chronic venous insufficiency with secondary lymphedema. She is apparently followed by vein and vascular and is being scheduled for an ablation 11-17-2021 upon evaluation today patient appears to be doing well with regard to her wound this is actually showing signs of improvement which is great news. Fortunately I do not see any evidence of active infection locally or systemically at this time which is great news. No fevers, chills, nausea, vomiting, or diarrhea. 11-24-2021 upon evaluation today patient's wound is actually showing signs of significant improvement. Unfortunately she had quite a bit of pain with debridement last week I do believe it was beneficial but at the same time she is doing much better but still really does not want this debrided again I think being that it is looking a whole lot better I would try to avoid that today especially since it caused her so much discomfort that is really not the goal and I explained that  to the patient today she voiced understanding and knows that it needed to be done but still states that it was quite painful. 11-30-2021 upon evaluation today patient appears to be doing excellent in regard to her wound this is actually showing signs of excellent improvement I am very pleased with where things stand. She does have her venous ablation appointment for October 17. 12-21-2021 upon evaluation today patient appears to be doing better in regard to her wound this is measuring smaller and looking better as well. Fortunately I do not see any signs of active infection locally or systemically which is great news. 01-01-2022 upon evaluation today patient appears to be doing well currently in regard to  her wound. She is showing signs of improvement which is great news and overall I do not see any signs of active infection locally or systemically at this time. 01-08-2022 upon evaluation today patient appears to be doing well currently in regard to her wound she is actually showing signs of significant improvement which is great news. Fortunately I do not see any evidence of active infection locally or systemically at this time. I do believe that we are on the right track. She also has her appointment October 19 for the venous ablation. 01-16-2022 upon evaluation today patient's wound actually is showing some signs of improvement although this is very slow. Fortunately I do not see any evidence of infection at this time. The volume is a little bit more although the size is smaller I think this is due to the fact that we are slowly cleaning this area out effectively. 11/6 continued improvement the patient is using Iodoflex and a Tubigrip E. She was supposed to have venous surgery at vein and vascular in [place] however somehow this is gotten delayed till December 14. 02-27-2022 upon evaluation today patient's wound is actually showing signs of being completely healed. Fortunately I do not see any  evidence of active infection locally or systemically which is great news and overall I am extremely pleased with where we stand today. Unfortunately she does tell me that she postpone her venous ablation surgery until December 14 she tells me that she was not ready "financially" for this. 04/18/2022; [name] is a 37 year old female with a past medical history of venous insufficiency that presents the clinic for a left lower extremity wound. She was seen almost 2 months ago for the same wound. This had healed with Iodoflex and Tubigrip. She states that the wound recently reopened. She has seen vein and vascular and plan is for ligation and stripping of the left great saphenous vein. She is not sure when this procedure is going to be scheduled. She has canceled it once before. She has had office compression wraps in the past however these do not stay on and create more of an issue for her. She would like to avoid this. She currently denies signs of infection. 1/10; patient presents for follow-up. She has been using Iodoflex to the wound bed. She states she has been using her Tubigrip however she does not have this on today. She has no issues or complaints today. She denies signs of infection. 1/17; patient presents for follow-up. She has not been using Iodoflex to the wound bed. She reports acute pain. She denies increased warmth, erythema or purulent drainage to the left lower extremity. She states she has been using Tubigrip. She has information to order the compression stockings but has not obtained them. Electronic Signature(s) Signed: 05/02/2022 10:02:09 AM By: Geralyn Corwin DO Entered By: Geralyn Corwin on 05/02/2022 09:52:45 -------------------------------------------------------------------------------- Physical Exam Details Patient Name: Date of Service: [name] Moody MA [name]. 05/02/2022 9:00 A M Medical Record Number: [id number] Patient Account Number: [id number] Date of Birth/Sex:  Treating RN: 08/04/1985 (37 y.o. [name] Primary Care Provider: [name] Other Clinician: Referring Provider: Treating Provider/Extender: Emeline Darling in Treatment: 2 Constitutional . Cardiovascular . [name], [name] ([id number]) [id number] Page 4 of 9 Psychiatric . Notes Left lower extremity: T the distal medial aspect there is an open wound with granulation tissue and nonviable tissue. Wound bed has spots with darkened o appearance. There is increased pain on palpation. No increased warmth, erythema or  purulent drainage. Electronic Signature(s) Signed: 05/02/2022 10:02:09 AM By: Geralyn Corwin DO Entered By: Geralyn Corwin on 05/02/2022 09:53:30 -------------------------------------------------------------------------------- Physician Orders Details Patient Name: Date of Service: [name] Moody MA [name]. 05/02/2022 9:00 A M Medical Record Number: [id number] Patient Account Number: [id number] Date of Birth/Sex: Treating RN: 13-Jan-1986 (37 y.o. [name] Primary Care Provider: [name] Other Clinician: Referring Provider: Treating Provider/Extender: Emeline Darling in Treatment: 2 Verbal / Phone Orders: No Diagnosis Coding Follow-up Appointments Return Appointment in 1 week. Bathing/ Shower/ Hygiene May shower; gently cleanse wound with antibacterial soap, rinse and pat dry prior to dressing wounds Anesthetic (Use 'Patient Medications' Section for Anesthetic Order Entry) Lidocaine applied to wound bed Edema Control - Lymphedema / Segmental Compressive Device / Other Elevate, Exercise Daily and A void Standing for Long Periods of Time. Elevate legs to the level of the heart and pump ankles as often as possible Elevate leg(s) parallel to the floor when sitting. Wound Treatment Wound #5 - Lower Leg Wound Laterality: Left, Medial Cleanser: Byram Ancillary Kit - 15 Day  Supply (DME) (Generic) 1 x Per Day/30 Days Discharge Instructions: Use supplies as instructed; Kit contains: (15) Saline Bullets; (15) 3x3 Gauze; 15 pr Gloves Prim Dressing: Hydrofera Blue Ready Transfer Foam, 2.5x2.5 (in/in) (DME) (Generic) 1 x Per Day/30 Days ary Discharge Instructions: Apply Hydrofera Blue Ready to wound bed as directed Secondary Dressing: (BORDER) Zetuvit Plus SILICONE BORDER Dressing 4x4 (in/in) (DME) (Generic) 1 x Per Day/30 Days Discharge Instructions: Please do not put silicone bordered dressings under wraps. Use non-bordered dressing only. Secured With: Tubigrip Size F, 4x10 (in/yd) (DME) (Generic) 1 x Per Day/30 Days Discharge Instructions: double layer Apply Tubigrip F 3 finger-widths below knee to base of toes to secure dressing and/or for swelling. Laboratory Bacteria identified in Wound by Culture (MICRO) - foul odor, increased pain, swelling , LOINC Code: 6462-6 Convenience Name: Wound culture routine Services and Therapies Other Services or Procedures - Keystone gel to be ordered once culture results obtained ALDORA, PERMAN ([id number]) [id number] Page 5 of 9 Patient Medications llergies: tramadol A Notifications Medication Indication Start End 05/02/2022 levofloxacin DOSE 1 - oral 750 mg tablet - 1 tablet oral once daily x 10 days Electronic Signature(s) Signed: 05/02/2022 12:38:47 PM By: Geralyn Corwin DO Signed: 05/02/2022 4:29:22 PM By: Yevonne Pax RN Previous Signature: 05/02/2022 10:02:09 AM Version By: Geralyn Corwin DO Previous Signature: 05/02/2022 10:00:48 AM Version By: Geralyn Corwin DO Previous Signature: 05/02/2022 9:34:50 AM Version By: Yevonne Pax RN Entered By: Yevonne Pax on 05/02/2022 10:07:43 -------------------------------------------------------------------------------- Problem List Details Patient Name: Date of Service: [name] Moody MA [name]. 05/02/2022 9:00 A M Medical Record Number:  [id number] Patient Account Number: [id number] Date of Birth/Sex: Treating RN: 11/06/1985 (37 y.o. 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 05/02/2022 9:00 A M Medical Record Number: [id number] Patient Account Number: [id number] Date of Birth/Sex:  Treating RN: [birth year] (36 y.o. [name] Primary Care Provider: [name] Other Clinician: Referring Provider: Treating Provider/Extender: Emeline Darling in Treatment: 2 Subjective Chief Complaint Information obtained from Patient 04/18/2022; Left LE Ulcer History of Present Illness (HPI) 37 year old patient who started with having ulcerations on the right lower leg on the lateral part of her ankle for about 2 weeks. She was seen in the ER at [facility] and advised to see the wound care for a consultation. No X-rays of workup was done during the ER visit and no prescription for any medications of compression wraps were given. the patient is not diabetic but does have hypertension and her medications have been reviewed by me. 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 The right and left greater saphenous vein was found to be tortuous. Deep venous system was also not competent and there was reflux of more than 500 ms. She was then seen by Dr. Karie Schwalbe Early who recommended that the patient would not benefit from endovenous ablation and he had recommended vein stripping odd on the right side and multiple small phlebectomy procedures on the left side. the patient did not follow-up due to social economic reasons. She has not been wearing any compression stockings and has not taken any specific treatment for varicose veins for the last 3 years. 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 We have obtained some appointments to see the vascular surgeons by the end of the month and the patient would like to follow up with me at my [facility] on Wednesday, June 29. 10/14/2014 -- she could not see me yesterday in [place] and hence has come for a review today. She has a vascular workup to be done this afternoon at [facility]. She is doing fine otherwise. 10/22/2014 -- she was seen by Dr. Brantley Fling and he has recommended surgical removal of her right saphenous vein from distal thigh to saphenofemoral odd junction and stab phlebectomy's of multiple large tributary branches throughout her thigh and calf. This would be done under general anesthesia in the outpatient setting. 10/29/2014 -- she is trying to work on a surgical date and in the meanwhile we have got insurance clearance for Apligraf and we will start this next week. 7/22 2016 -- she is here for the first application of Apligraf. 11/19/2014 -- she is here for a second application of Apligraf 11/26/2014 -- she has done  fine after her last application of Apligraf and is awaiting her surgery which is scheduled for August 31. 12/03/2014 -- she is doing fine and is here for her third application of Apligraf. 12/21/2014 -- She had surgery on 12/15/2014 by Dr.Early who did #1 ligation and stripping of right great saphenous vein from distal thigh to saphenofemoral junction, #2 stab phlebectomy of large tributary varicose veins in the thigh popliteal space and calf. She had an Ace wrap up to her groin and this was removed today and the Unna's boot was also removed. 12/28/2014 -- she is here for her fourth application of Apligraf. 01/06/2015 - he saw her vascular surgeon Dr. Arbie Cookey who was pleased with her progress and he has confirmed that no surgical procedures could be attempted on the left side.  01/13/2015 -- her wound looks very good and she's been having no problems whatsoever. Readmission: 07/26/2020 upon evaluation today patient presents for initial inspection here in our clinic for a new issue with her left leg although she is previously been seen due to issues with the right leg back in 2016. At that time she was seeing Dr. Donnetta Hutching who is a vein/vascular specialist in [place]. He has since semiretired from what I understand. He is working out of [employer] I believe. Nonetheless she tells me at the time that there was really nothing to do for her left leg although the right leg was where they did most of the work. Subsequently she states that she is done fairly well until just in the past week where she had issues with bleeding from what appears to be varicose vein on the left leg medially. Unfortunately this has continued to be an issue although she tells me at first it was coming much more significantly Down quite a bit but nonetheless has not completely resolved. Every time she showers she notices that it starts to drain a little bit more. She does have a history of chronic venous insufficiency, lymphedema,  varicose veins bilaterally, and obesity. 08/02/2020 upon evaluation today patient appears to be doing about the same in regards to the ulcer on her left leg. She has some eschar covering there is definitely some fluid collecting underneath unfortunately. With that being said she tells me she is still having a tremendous amount of pain therefore she is really not able to allow me to clean this off very effectively to be perfectly honest. I think we need to try to soften this up 08/16/2020 upon evaluation today patient's wound is really not doing significantly better not really states about the same. She notes that the wrap just does not seem to be staying up very well at all unfortunately. No fevers, chills, nausea, vomiting, or diarrhea. She did cut it off once it starts to slide in order to alleviate some of the pressure from sliding Down. Fortunately there is no signs of active infection at this time which is great news. 08/23/2020 upon evaluation today patient appears to be doing well 08/23/2020 upon evaluation today patient appears to be doing well with regard to her wound all things considered. Fortunately there does not appear to be any signs of active infection at this time which is great news. She has been tolerating the dressing [name], [name] ([id number]) [id number] Page 7 of 9 changes without complication and overall I am extremely pleased with where things stand at this point. She does have her appointment with vascular in [facility] on June 9. 08/30/2020 upon evaluation today patient actually appears to be doing decently well in regard to her wound. Fortunately there is no signs of active infection which is great news. Nonetheless I do believe that the patient is going require little bit of debridement if she is okay with me attempting that today I think that will help clean off some of the necrotic tissue. Fortunately there does not appear to be otherwise any  evidence of active infection which is also great news. 09/19/2020 upon evaluation today patient appears to be doing a little better in regard to her wound as compared to previous. Fortunately there does not appear to be any signs of active infection overall. No fever chills noted. I do believe that the Iodosorb has made this a little bit better with regard to the overall size and appearance of the wound  bed though again she does still have quite a ways to go to get this to heal she still very tender to touch. 09/27/2020 upon evaluation today patient appears to actually be doing quite well with regard to her wound. This is measuring much smaller which is great news. With that being said she did see vein and vascular in [facility] and they subsequently recommended that surgery is really what she probably needs to go forward with sounds like the potential for venous ablation. With that being said the patient tells me this is just not the right time for her to be able to proceed with any type of surgery which I completely understand. Nonetheless I do believe that she would continue to benefit from compression but again that is really not something that she is able to easily do. 10/04/2020 upon evaluation today patient appears to be doing about the same in regard to her wound. This is measuring a little bit smaller but nonetheless still is open and again has some slough and biofilm noted on the surface of the wound. There does not appear to be any signs of active infection which is great news. No fevers, chills, nausea, vomiting, or diarrhea. 10/04/2020 upon evaluation today patient appears to be doing well with regard to her wound. She has been tolerating dressing changes without complication. Fortunately there does not appear to be any signs of active infection which is great news. No fevers, chills, nausea, vomiting, or diarrhea. 10/25/2020 upon evaluation today patient appears to be doing well with regard to  her wound. She has been tolerating the dressing changes without complication. Fortunately there is no signs of active infection at this time. No fevers, chills, nausea, vomiting, or diarrhea. 11/01/2020 upon evaluation today patient with regard to her wound. She has been tolerating the dressing changes without complication. Fortunately there does not appear to be any signs of infection which is great news. No fever chills noted 11/15/2020 upon evaluation today patient appears to be doing well with regard to her wound. Fortunately there is no signs of active infection at this time. No fevers, chills, nausea, vomiting, or diarrhea. With that being said she continues to have a significant amount of pain at the site even though this is very close to complete closure. She also had several varicose veins around the area which were also problematic. Overall however I feel like the patient is making excellent progress. 11/28/2020 upon evaluation today patient appears to be doing well with regard to her leg ulcer. Again were not really able to debride or compression wrap her due to discomfort and pain. She does not allow for that. With that being said we have been using Iodosorb which does seem to be doing decently well. Fortunately there is no signs of active infection at this time which is great news. No fevers, chills, nausea, vomiting, or diarrhea. 8/31; patient presents for 2-week follow-up. She has been using Iodosorb. She reports that the wound is closed. She denies signs of infection. Readmission: 10-27-2021 upon evaluation this is a patient that presents today whom I have previously seen this is pretty much for the same issue though I think a little bit higher than the last time I saw her. She does have a history of chronic venous insufficiency and hypertension along with varicose veins. Subsequently she does have an ulceration which spontaneously ruptured she has not been wearing any compression which I  think is a big part of the issue here. We discussed this before  I really think she probably needs to be wearing her compression therapy, she probably needs lymphedema pumps if she can ever wear the compression for a significant amount of time to get these, and subsequently also think that she needs to be elevating her legs is much as possible she may even need some vascular intervention in regard to her veins. All of this was reiterated and discussed with her today to reinforce what needs to happen in order to ensure that her legs do not get a lot worse. The patient voiced understanding. She tells me that she knows because she is seeing her mom go through a lot of this as well how bad things can get. 11-03-2021 upon evaluation today patient appears to be doing well with regard to her wound. Fortunately there does not appear to be any signs of active infection at this time. She is measuring a little bit bigger but I think this is because the wound is actually cleaning up a bit here. 7/27; left lateral leg wound not any smaller but perhaps with a cleaner surface. She is using Iodoflex to help with the latter and using Tubigrip. She has chronic venous insufficiency with secondary lymphedema. She is apparently followed by vein and vascular and is being scheduled for an ablation 11-17-2021 upon evaluation today patient appears to be doing well with regard to her wound this is actually showing signs of improvement which is great news. Fortunately I do not see any evidence of active infection locally or systemically at this time which is great news. No fevers, chills, nausea, vomiting, or diarrhea. 11-24-2021 upon evaluation today patient's wound is actually showing signs of significant improvement. Unfortunately she had quite a bit of pain with debridement last week I do believe it was beneficial but at the same time she is doing much better but still really does not want this debrided again I think being that  it is looking a whole lot better I would try to avoid that today especially since it caused her so much discomfort that is really not the goal and I explained that to the patient today she voiced understanding and knows that it needed to be done but still states that it was quite painful. 11-30-2021 upon evaluation today patient appears to be doing excellent in regard to her wound this is actually showing signs of excellent improvement I am very pleased with where things stand. She does have her venous ablation appointment for October 17. 12-21-2021 upon evaluation today patient appears to be doing better in regard to her wound this is measuring smaller and looking better as well. Fortunately I do not see any signs of active infection locally or systemically which is great news. 01-01-2022 upon evaluation today patient appears to be doing well currently in regard to her wound. She is showing signs of improvement which is great news and overall I do not see any signs of active infection locally or systemically at this time. 01-08-2022 upon evaluation today patient appears to be doing well currently in regard to her wound she is actually showing signs of significant improvement which is great news. Fortunately I do not see any evidence of active infection locally or systemically at this time. I do believe that we are on the right track. She also has her appointment October 19 for the venous ablation. 01-16-2022 upon evaluation today patient's wound actually is showing some signs of improvement although this is very slow. Fortunately I do not see any evidence of infection  at this time. The volume is a little bit more although the size is smaller I think this is due to the fact that we are slowly cleaning this area out effectively. 11/6 continued improvement the patient is using Iodoflex and a Tubigrip E. She was supposed to have venous surgery at vein and vascular in [place] however somehow this is gotten  delayed till December 14. 02-27-2022 upon evaluation today patient's wound is actually showing signs of being completely healed. Fortunately I do not see any evidence of active infection locally or systemically which is great news and overall I am extremely pleased with where we stand today. Unfortunately she does tell me that she postpone her venous ablation surgery until December 14 she tells me that she was not ready "financially" for this. 04/18/2022; [name] is a 37 year old female with a past medical history of venous insufficiency that presents the clinic for a left lower extremity wound. She was seen almost 2 months ago for the same wound. This had healed with Iodoflex and Tubigrip. She states that the wound recently reopened. She [name], [name] ([id number]) [id number] Page 8 of 9 has seen vein and vascular and plan is for ligation and stripping of the left great saphenous vein. She is not sure when this procedure is going to be scheduled. She has canceled it once before. She has had office compression wraps in the past however these do not stay on and create more of an issue for her. She would like to avoid this. She currently denies signs of infection. 1/10; patient presents for follow-up. She has been using Iodoflex to the wound bed. She states she has been using her Tubigrip however she does not have this on today. She has no issues or complaints today. She denies signs of infection. 1/17; patient presents for follow-up. She has not been using Iodoflex to the wound bed. She reports acute pain. She denies increased warmth, erythema or purulent drainage to the left lower extremity. She states she has been using Tubigrip. She has information to order the compression stockings but has not obtained them. Objective Constitutional Vitals Time Taken: 9:15 AM, Height: 66 in, Weight: 400 lbs, BMI: 64.6, Temperature: 98.2 F, Pulse: 109 bpm, Respiratory Rate:  18 breaths/min, Blood Pressure: 138/96 mmHg. General Notes: Left lower extremity: T the distal medial aspect there is an open wound with granulation tissue and nonviable tissue. Wound bed has spots with o darkened appearance. There is increased pain on palpation. No increased warmth, erythema or purulent drainage. Integumentary (Hair, Skin) Wound #5 status is Open. Original cause of wound was Gradually Appeared. The date acquired was: 04/10/2022. The wound has been in treatment 2 weeks. The wound is located on the Left,Medial Lower Leg. The wound measures 2.5cm length x 4.5cm width x 0.5cm depth; 8.836cm^2 area and 4.418cm^3 volume. There is Fat Layer (Subcutaneous Tissue) exposed. There is no tunneling or undermining noted. There is a medium amount of serosanguineous drainage noted. There is no granulation within the wound bed. There is a large (67-100%) amount of necrotic tissue within the wound bed including Eschar and Adherent Slough. 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 She reports acute pain to the wound site. The surface appears infected. I recommended oral antibiotics. She would also benefit from  Keystone antibiotic ointment. A culture was obtained to help guide this. For now I recommended using a wound cleanser daily and Hydrofera Blue to the wound bed. She needs more compression. I recommended she order the compression stockings that were recommended on the first visit. Once the infection is under control we will need to start wrapping her in office. She is supposed to have ligation and stripping of the left great saphenous vein however she has canceled this procedure and delayed getting this. Unfortunately she is on her  feet all day due to work. If things do not improve over the next 1 to 2 weeks. She will need to be out of work for this to heal. Plan Follow-up Appointments: Return Appointment in 1 week. Bathing/ Shower/ Hygiene: May shower; gently cleanse wound with antibacterial soap, rinse and pat dry prior to dressing wounds Anesthetic (Use 'Patient Medications' Section for Anesthetic Order Entry): Lidocaine applied to wound bed Edema Control - Lymphedema / Segmental Compressive Device / Other: Elevate, Exercise Daily and Avoid Standing for Long Periods of Time. Elevate legs to the level of the heart and pump ankles as often as possible Elevate leg(s) parallel to the floor when sitting. Laboratory ordered were: Wound culture routine - foul odor, increased pain, swelling , WOUND #5: - Lower Leg Wound Laterality: Left, Medial Cleanser: Byram Ancillary Kit - 15 Day Supply (DME) (Generic) 1 x Per Day/30 Days Discharge Instructions: Use supplies as instructed; Kit contains: (15) Saline Bullets; (15) 3x3 Gauze; 15 pr Gloves Prim Dressing: Hydrofera Blue Ready Transfer Foam, 2.5x2.5 (in/in) (DME) (Generic) 1 x Per Day/30 Days ary Discharge Instructions: Apply Hydrofera Blue Ready to wound bed as directed Secondary Dressing: (BORDER) Zetuvit Plus SILICONE BORDER Dressing 4x4 (in/in) (DME) (Generic) 1 x Per Day/30 Days Discharge Instructions: Please do not put silicone bordered dressings under wraps. Use non-bordered dressing only. Secured With: Tubigrip Size F, 4x10 (in/yd) (DME) (Generic) 1 x Per Day/30 Days Discharge Instructions: double layer Apply Tubigrip F 3 finger-widths below knee to base of toes to secure dressing and/or for swelling. 1. Wound cleanser and Hydrofera Blue dressing changes daily [name], [name] ([id number]) [id number] Page 9 of 9 2. Levofloxacin 3. Compression stockings daily, Tubigrip given in office 4. Follow-up in 1 week 5. Cultureoofor Keystone  antibiotic ointment Electronic Signature(s) Signed: 05/02/2022 10:02:09 AM By: Geralyn Corwin DO Entered By: Geralyn Corwin on 05/02/2022 09:59:32 -------------------------------------------------------------------------------- SuperBill Details Patient Name: Date of Service: [name] Moody MA [name]. 05/02/2022 Medical Record Number: [id number] Patient Account Number: [id number] Date of Birth/Sex: Treating RN: 25-Jan-1986 (37 y.o. [name] Primary Care Provider: [name] Other Clinician: Referring Provider: Treating Provider/Extender: Emeline Darling in Treatment: 2 Diagnosis Coding ICD-10 Codes Code Description [phone] Chronic venous hypertension (idiopathic) with ulcer of left lower extremity L97.822 Non-pressure chronic ulcer of other part of left lower leg with fat layer exposed I89.0 Lymphedema, not elsewhere classified Facility Procedures : CPT4 Code: [id number] Description: [phone] - WOUND CARE VISIT-LEV 2 EST PT Modifier: Quantity: 1 Physician Procedures : CPT4 Code Description Modifier [id number] - WC PHYS LEVEL 4 - EST PT ICD-10 Diagnosis Description I87.312 Chronic venous hypertension (idiopathic) with ulcer of left lower extremity L97.822 Non-pressure chronic ulcer of other part of left lower leg  with fat layer exposed I89.0 Lymphedema, not elsewhere classified Quantity: 1 Electronic Signature(s) Signed: 05/02/2022 4:31:13 PM By: Yevonne Pax RN Signed: 05/02/2022 4:49:26 PM By: Geralyn Corwin DO Previous Signature: 05/02/2022 10:02:09 AM Version By: Geralyn Corwin DO Entered By:  Yevonne Paxpps, [name] on 05/02/2022 16:31:13

## 2022-05-03 ENCOUNTER — Ambulatory Visit: Payer: BC Managed Care – PPO | Admitting: Physician Assistant

## 2022-05-05 LAB — AEROBIC CULTURE W GRAM STAIN (SUPERFICIAL SPECIMEN)

## 2022-05-09 ENCOUNTER — Encounter (HOSPITAL_BASED_OUTPATIENT_CLINIC_OR_DEPARTMENT_OTHER): Payer: BC Managed Care – PPO | Admitting: Internal Medicine

## 2022-05-09 DIAGNOSIS — I87312 Chronic venous hypertension (idiopathic) with ulcer of left lower extremity: Secondary | ICD-10-CM | POA: Diagnosis not present

## 2022-05-09 DIAGNOSIS — L97822 Non-pressure chronic ulcer of other part of left lower leg with fat layer exposed: Secondary | ICD-10-CM

## 2022-05-09 DIAGNOSIS — I890 Lymphedema, not elsewhere classified: Secondary | ICD-10-CM

## 2022-05-09 NOTE — Progress Notes (Signed)
[name] ([id number]) [id number] Page 1 of 9 Visit Report for 05/09/2022 Chief Complaint Document Details Patient Name: Date of Service: [name], [name] [place] 05/09/2022 3:45 PM Medical Record Number: [id number] Patient Account Number: [id number] Date of Birth/Sex: Treating RN: May 19, 1985 (37 y.o. [name] Primary Care Provider: [name] Other Clinician: Referring Provider: Treating Provider/Extender: Emeline Darling in Treatment: 3 Information Obtained from: Patient Chief Complaint 04/18/2022; Left LE Ulcer Electronic Signature(s) Signed: 05/09/2022 4:49:46 PM By: Geralyn Corwin DO Entered By: Geralyn Corwin on 05/09/2022 16:33:26 -------------------------------------------------------------------------------- HPI Details Patient Name: Date of Service: [name] Moody MA J. 05/09/2022 3:45 PM Medical Record Number: [id number] Patient Account Number: [id number] Date of Birth/Sex: Treating RN: 1985-11-27 (37 y.o. [name] Primary Care Provider: [name] Other Clinician: Referring Provider: Treating Provider/Extender: Emeline Darling in Treatment: 3 History of Present Illness HPI Description: 37 year old patient who started with having ulcerations on the right lower leg on the lateral part of her ankle for about 2 weeks. She was seen in the ER at [facility] and advised to see the wound care for a consultation. No X-rays of workup was done during the ER visit and no prescription for any medications of compression wraps were given. the patient is not diabetic but does have hypertension and her medications have been reviewed by me. 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 The right and left greater saphenous vein was  found to be tortuous. Deep venous system was also not competent and there was reflux of more than 500 ms. She was then seen by Dr. Karie Schwalbe Early who recommended that the patient would not benefit from endovenous ablation and he had recommended vein stripping odd on the right side and multiple small phlebectomy procedures on the left side. the patient did not follow-up due to social economic reasons. She has not been wearing any compression stockings and has not taken any specific treatment for varicose veins for the last 3 years. 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 We have obtained some appointments to see the vascular surgeons by the end of the month and the patient would like to follow up with me at my [facility] on Wednesday, June 29. 10/14/2014 -- she could not see me yesterday in [place] and hence has come for a review today. She has a vascular workup to be done this afternoon at [facility], [facility] ([id number]) [id number] Page 2 of 9 [place]. She is doing fine otherwise. 10/22/2014 -- she was seen by Dr. Brantley Fling and he has recommended surgical removal of her right saphenous vein from distal thigh to saphenofemoral odd junction and stab phlebectomy's of multiple large tributary branches throughout her thigh and calf. This would be done under general anesthesia in the outpatient setting. 10/29/2014 -- she is trying to work on a surgical date and in the meanwhile we have got insurance clearance for Apligraf and we will start this next week. 7/22 2016 -- she is here for the first application of Apligraf. 11/19/2014 -- she is here for a second application of Apligraf 11/26/2014 -- she has done fine after her last application of Apligraf and is awaiting her surgery which is scheduled for August 31.  12/03/2014 -- she is doing fine and is here for her third  application of Apligraf. 12/21/2014 -- She had surgery on 12/15/2014 by Dr.Early who did #1 ligation and stripping of right great saphenous vein from distal thigh to saphenofemoral junction, #2 stab phlebectomy of large tributary varicose veins in the thigh popliteal space and calf. She had an Ace wrap up to her groin and this was removed today and the Unna's boot was also removed. 12/28/2014 -- she is here for her fourth application of Apligraf. 01/06/2015 - he saw her vascular surgeon Dr. Arbie Cookey who was pleased with her progress and he has confirmed that no surgical procedures could be attempted on the left side. 01/13/2015 -- her wound looks very good and she's been having no problems whatsoever. Readmission: 07/26/2020 upon evaluation today patient presents for initial inspection here in our clinic for a new issue with her left leg although she is previously been seen due to issues with the right leg back in 2016. At that time she was seeing Dr. Arbie Cookey who is a vein/vascular specialist in [place]. He has since semiretired from what I understand. He is working out of [employer] I believe. Nonetheless she tells me at the time that there was really nothing to do for her left leg although the right leg was where they did most of the work. Subsequently she states that she is done fairly well until just in the past week where she had issues with bleeding from what appears to be varicose vein on the left leg medially. Unfortunately this has continued to be an issue although she tells me at first it was coming much more significantly Down quite a bit but nonetheless has not completely resolved. Every time she showers she notices that it starts to drain a little bit more. She does have a history of chronic venous insufficiency, lymphedema, varicose veins bilaterally, and obesity. 08/02/2020 upon evaluation today patient appears to be doing about the same in regards to the ulcer on her left leg. She has  some eschar covering there is definitely some fluid collecting underneath unfortunately. With that being said she tells me she is still having a tremendous amount of pain therefore she is really not able to allow me to clean this off very effectively to be perfectly honest. I think we need to try to soften this up 08/16/2020 upon evaluation today patient's wound is really not doing significantly better not really states about the same. She notes that the wrap just does not seem to be staying up very well at all unfortunately. No fevers, chills, nausea, vomiting, or diarrhea. She did cut it off once it starts to slide in order to alleviate some of the pressure from sliding Down. Fortunately there is no signs of active infection at this time which is great news. 08/23/2020 upon evaluation today patient appears to be doing well 08/23/2020 upon evaluation today patient appears to be doing well with regard to her wound all things considered. Fortunately there does not appear to be any signs of active infection at this time which is great news. She has been tolerating the dressing changes without complication and overall I am extremely pleased with where things stand at this point. She does have her appointment with vascular in [facility] on June 9. 08/30/2020 upon evaluation today patient actually appears to be doing decently well in regard to her wound. Fortunately there is no signs of active infection which is great news. Nonetheless I do believe that the  patient is going require little bit of debridement if she is okay with me attempting that today I think that will help clean off some of the necrotic tissue. Fortunately there does not appear to be otherwise any evidence of active infection which is also great news. 09/19/2020 upon evaluation today patient appears to be doing a little better in regard to her wound as compared to previous. Fortunately there does not appear to be any signs of active infection  overall. No fever chills noted. I do believe that the Iodosorb has made this a little bit better with regard to the overall size and appearance of the wound bed though again she does still have quite a ways to go to get this to heal she still very tender to touch. 09/27/2020 upon evaluation today patient appears to actually be doing quite well with regard to her wound. This is measuring much smaller which is great news. With that being said she did see vein and vascular in [facility] and they subsequently recommended that surgery is really what she probably needs to go forward with sounds like the potential for venous ablation. With that being said the patient tells me this is just not the right time for her to be able to proceed with any type of surgery which I completely understand. Nonetheless I do believe that she would continue to benefit from compression but again that is really not something that she is able to easily do. 10/04/2020 upon evaluation today patient appears to be doing about the same in regard to her wound. This is measuring a little bit smaller but nonetheless still is open and again has some slough and biofilm noted on the surface of the wound. There does not appear to be any signs of active infection which is great news. No fevers, chills, nausea, vomiting, or diarrhea. 10/04/2020 upon evaluation today patient appears to be doing well with regard to her wound. She has been tolerating dressing changes without complication. Fortunately there does not appear to be any signs of active infection which is great news. No fevers, chills, nausea, vomiting, or diarrhea. 10/25/2020 upon evaluation today patient appears to be doing well with regard to her wound. She has been tolerating the dressing changes without complication. Fortunately there is no signs of active infection at this time. No fevers, chills, nausea, vomiting, or diarrhea. 11/01/2020 upon evaluation today patient with regard to  her wound. She has been tolerating the dressing changes without complication. Fortunately there does not appear to be any signs of infection which is great news. No fever chills noted 11/15/2020 upon evaluation today patient appears to be doing well with regard to her wound. Fortunately there is no signs of active infection at this time. No fevers, chills, nausea, vomiting, or diarrhea. With that being said she continues to have a significant amount of pain at the site even though this is very close to complete closure. She also had several varicose veins around the area which were also problematic. Overall however I feel like the patient is making excellent progress. 11/28/2020 upon evaluation today patient appears to be doing well with regard to her leg ulcer. Again were not really able to debride or compression wrap her due to discomfort and pain. She does not allow for that. With that being said we have been using Iodosorb which does seem to be doing decently well. Fortunately there is no signs of active infection at this time which is great news. No fevers, chills, nausea, vomiting,  or diarrhea. 8/31; patient presents for 2-week follow-up. She has been using Iodosorb. She reports that the wound is closed. She denies signs of infection. Readmission: 10-27-2021 upon evaluation this is a patient that presents today whom I have previously seen this is pretty much for the same issue though I think a little bit higher than the last time I saw her. She does have a history of chronic venous insufficiency and hypertension along with varicose veins. Subsequently she does have an ulceration which spontaneously ruptured she has not been wearing any compression which I think is a big part of the issue here. We discussed this before I really think she probably needs to be wearing her compression therapy, she probably needs lymphedema pumps if she can ever wear the compression for a significant amount of time to  get these, and subsequently also think that she needs to be elevating her legs is much as possible she may even need some vascular intervention in regard to her veins. All of this was reiterated and discussed with her today to reinforce what needs to happen in order to ensure that her legs do not get a lot worse. The patient voiced understanding. She tells me that she knows because she is seeing her mom go through a lot of this as well how bad things can get. RAIZA, KIESEL ([id number]) [id number] Page 3 of 9 11-03-2021 upon evaluation today patient appears to be doing well with regard to her wound. Fortunately there does not appear to be any signs of active infection at this time. She is measuring a little bit bigger but I think this is because the wound is actually cleaning up a bit here. 7/27; left lateral leg wound not any smaller but perhaps with a cleaner surface. She is using Iodoflex to help with the latter and using Tubigrip. She has chronic venous insufficiency with secondary lymphedema. She is apparently followed by vein and vascular and is being scheduled for an ablation 11-17-2021 upon evaluation today patient appears to be doing well with regard to her wound this is actually showing signs of improvement which is great news. Fortunately I do not see any evidence of active infection locally or systemically at this time which is great news. No fevers, chills, nausea, vomiting, or diarrhea. 11-24-2021 upon evaluation today patient's wound is actually showing signs of significant improvement. Unfortunately she had quite a bit of pain with debridement last week I do believe it was beneficial but at the same time she is doing much better but still really does not want this debrided again I think being that it is looking a whole lot better I would try to avoid that today especially since it caused her so much discomfort that is really not the goal and I explained that  to the patient today she voiced understanding and knows that it needed to be done but still states that it was quite painful. 11-30-2021 upon evaluation today patient appears to be doing excellent in regard to her wound this is actually showing signs of excellent improvement I am very pleased with where things stand. She does have her venous ablation appointment for October 17. 12-21-2021 upon evaluation today patient appears to be doing better in regard to her wound this is measuring smaller and looking better as well. Fortunately I do not see any signs of active infection locally or systemically which is great news. 01-01-2022 upon evaluation today patient appears to be doing well currently in regard to her wound.  She is showing signs of improvement which is great news and overall I do not see any signs of active infection locally or systemically at this time. 01-08-2022 upon evaluation today patient appears to be doing well currently in regard to her wound she is actually showing signs of significant improvement which is great news. Fortunately I do not see any evidence of active infection locally or systemically at this time. I do believe that we are on the right track. She also has her appointment October 19 for the venous ablation. 01-16-2022 upon evaluation today patient's wound actually is showing some signs of improvement although this is very slow. Fortunately I do not see any evidence of infection at this time. The volume is a little bit more although the size is smaller I think this is due to the fact that we are slowly cleaning this area out effectively. 11/6 continued improvement the patient is using Iodoflex and a Tubigrip E. She was supposed to have venous surgery at vein and vascular in [place] however somehow this is gotten delayed till December 14. 02-27-2022 upon evaluation today patient's wound is actually showing signs of being completely healed. Fortunately I do not see any  evidence of active infection locally or systemically which is great news and overall I am extremely pleased with where we stand today. Unfortunately she does tell me that she postpone her venous ablation surgery until December 14 she tells me that she was not ready "financially" for this. 04/18/2022; Ms. Danaye Sobh is a 37 year old female with a past medical history of venous insufficiency that presents the clinic for a left lower extremity wound. She was seen almost 2 months ago for the same wound. This had healed with Iodoflex and Tubigrip. She states that the wound recently reopened. She has seen vein and vascular and plan is for ligation and stripping of the left great saphenous vein. She is not sure when this procedure is going to be scheduled. She has canceled it once before. She has had office compression wraps in the past however these do not stay on and create more of an issue for her. She would like to avoid this. She currently denies signs of infection. 1/10; patient presents for follow-up. She has been using Iodoflex to the wound bed. She states she has been using her Tubigrip however she does not have this on today. She has no issues or complaints today. She denies signs of infection. 1/17; patient presents for follow-up. She has not been using Iodoflex to the wound bed. She reports acute pain. She denies increased warmth, erythema or purulent drainage to the left lower extremity. She states she has been using Tubigrip. She has information to order the compression stockings but has not obtained them. 1/24; Patient had a wound culture done at last clinic visit that grew Proteus mirabilis and E. coli. She was prescribed levofloxacin and this should cover both bacteria. She has been taking the medication over the past week with improvement of symptoms to the wound bed. She has been using Hydrofera Blue under Tubigrip. She states the [facility] is sticking to her wound bed. Electronic  Signature(s) Signed: 05/09/2022 4:49:46 PM By: Geralyn Corwin DO Entered By: Geralyn Corwin on 05/09/2022 16:47:19 -------------------------------------------------------------------------------- Physical Exam Details Patient Name: Date of Service: [name] Moody MA J. 05/09/2022 3:45 PM Medical Record Number: [id number] Patient Account Number: [id number] Date of Birth/Sex: Treating RN: [birth year] (37 y.o. [name] Primary Care Provider: [name] Other Clinician: Referring Provider: Treating Provider/Extender:  Guido SanderHoffman, Luiza Carranco Quillen, [name] Weeks in Treatment: 3 Constitutional Conley SimmondsSUMMERS, Alexsa J ([id number]) [id number] Page 4 of 9 . Cardiovascular . Psychiatric . Notes Left lower extremity: T the distal medial aspect there is an open wound with granulation tissue and nonviable tissue. No increased warmth, erythema or o purulent drainage. Electronic Signature(s) Signed: 05/09/2022 4:49:46 PM By: Geralyn CorwinHoffman, Margie Urbanowicz DO Entered By: Geralyn CorwinHoffman, Katelyn Broadnax on 05/09/2022 16:47:33 -------------------------------------------------------------------------------- Physician Orders Details Patient Name: Date of Service: [name] MoodySUMMERS, SHA MA J. 05/09/2022 3:45 PM Medical Record Number: [id number] Patient Account Number: [id number] Date of Birth/Sex: Treating RN: 04/28/1985 (37 y.o. Skip MayerF) Woody, Kim Primary Care Provider: [name], [name] Other Clinician: Referring Provider: Treating Provider/Extender: Emeline DarlingHoffman, Brewer Hitchman Quillen, [name] Weeks in Treatment: 3 Verbal / Phone Orders: No Diagnosis Coding ICD-10 Coding Code Description [phone] Chronic venous hypertension (idiopathic) with ulcer of left lower extremity L97.822 Non-pressure chronic ulcer of other part of left lower leg with fat layer exposed I89.0 Lymphedema, not elsewhere classified Follow-up Appointments Return Appointment in 1 week. Bathing/ Shower/ Hygiene May shower; gently cleanse wound with  antibacterial soap, rinse and pat dry prior to dressing wounds Anesthetic (Use 'Patient Medications' Section for Anesthetic Order Entry) Lidocaine applied to wound bed Edema Control - Lymphedema / Segmental Compressive Device / Other Elevate, Exercise Daily and A void Standing for Long Periods of Time. Elevate legs to the level of the heart and pump ankles as often as possible Elevate leg(s) parallel to the floor when sitting. Wound Treatment Wound #5 - Lower Leg Wound Laterality: Left, Medial Cleanser: Byram Ancillary Kit - 15 Day Supply (Generic) 1 x Per Day/30 Days Discharge Instructions: Use supplies as instructed; Kit contains: (15) Saline Bullets; (15) 3x3 Gauze; 15 pr Gloves Topical: Activon Honey Gel, 25 (g) Tube 1 x Per Day/30 Days Secondary Dressing: (BORDER) Zetuvit Plus SILICONE BORDER Dressing 4x4 (in/in) (Generic) 1 x Per Day/30 Days Discharge Instructions: Please do not put silicone bordered dressings under wraps. Use non-bordered dressing only. Secured With: Tubigrip Size F, 4x10 (in/yd) (Generic) 1 x Per Day/30 Days Discharge Instructions: double layer Apply Tubigrip F 3 finger-widths below knee to base of toes to secure dressing and/or for swelling. Conley SimmondsSUMMERS, Lakeisa J ([id number]) [id number] Page 5 of 9 Electronic Signature(s) Signed: 05/09/2022 5:30:54 PM By: Betha LoaVenable, Angie Signed: 05/10/2022 9:25:06 AM By: Geralyn CorwinHoffman, Brealyn Baril DO Entered By: Betha LoaVenable, Angie on 05/09/2022 16:49:56 -------------------------------------------------------------------------------- Problem List Details Patient Name: Date of Service: [name] MoodySUMMERS, SHA MA J. 05/09/2022 3:45 PM Medical Record Number: [id number] Patient Account Number: [id number] Date of Birth/Sex: Treating RN: 05/28/1985 (37 y.o. Skip MayerF) Woody, Kim Primary Care Provider: [name], [name] Other Clinician: Referring Provider: Treating Provider/Extender: Emeline DarlingHoffman, Jaylon Boylen Quillen, [name] Weeks in Treatment: 3 Active  Problems ICD-10 Encounter Code Description Active Date MDM Diagnosis I87.312 Chronic venous hypertension (idiopathic) with ulcer of left lower extremity 04/18/2022 No Yes L97.822 Non-pressure chronic ulcer of other part of left lower leg with fat layer exposed1/06/2022 No Yes I89.0 Lymphedema, not elsewhere classified 04/18/2022 No Yes Inactive Problems Resolved Problems Electronic Signature(s) Signed: 05/09/2022 4:49:46 PM By: Geralyn CorwinHoffman, Tavoris Brisk DO Entered By: Geralyn CorwinHoffman, Ariyanna Oien on 05/09/2022 16:33:20 -------------------------------------------------------------------------------- Progress Note Details Patient Name: Date of Service: [name] MoodySUMMERS, SHA MA J. 05/09/2022 3:45 PM Medical Record Number: [id number] Patient Account Number: [id number] Date of Birth/Sex: Treating RN: 04/17/1985 (37 y.o. Felipa EthF) Woody, Kim Ansley, [place] Shela CommonsJ ([id number]) [id number] Page 6 of 9 Primary Care Provider: [name], [name] Other Clinician: Referring Provider: Treating Provider/Extender: Emeline DarlingHoffman, Lantz Hermann Quillen, [name] Weeks in Treatment: 3 Subjective Chief Complaint Information obtained from Patient 04/18/2022; Left LE  Ulcer History of Present Illness (HPI) 37 year old patient who started with having ulcerations on the right lower leg on the lateral part of her ankle for about 2 weeks. [facility] advised to see the wound care for a consultation. No X-rays of workup was done during the ER visit and no prescription for any medications of compression wraps were given. the patient is not diabetic but does have hypertension and her medications have been reviewed by me. 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 The right and left greater saphenous vein was found to be tortuous. Deep venous system  was also not competent and there was reflux of more than 500 ms. She was then seen by Dr. Karie Schwalbe Early who recommended that the patient would not benefit from endovenous ablation and he had recommended vein stripping odd on the right side and multiple small phlebectomy procedures on the left side. the patient did not follow-up due to social economic reasons. She has not been wearing any compression stockings and has not taken any specific treatment for varicose veins for the last 3 years. 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 We have obtained some appointments to see the vascular surgeons by the end of the month and the patient would like to [facility], [facility]. 10/14/2014 -- she could not see me yesterday in [place] and hence has come for a review today. She has [facility] She is doing fine otherwise. 10/22/2014 -- she was seen by Dr. Brantley Fling Early and he has recommended surgical removal of her right saphenous vein from distal thigh to saphenofemoral odd junction and stab phlebectomy's of multiple large tributary branches throughout her thigh and calf. This would be done under general anesthesia in the outpatient setting. 10/29/2014 -- she is trying to work on a surgical date and in the meanwhile we have got insurance clearance for Apligraf and we will start this next week. 7/22 2016 -- she is here for the first application of Apligraf. 11/19/2014 -- she is here for a second application of Apligraf 11/26/2014 -- she has done fine after her last application of Apligraf and is awaiting her surgery which is scheduled for August 31. 12/03/2014 -- she is doing fine and is here for her third application of Apligraf. 12/21/2014 -- She had surgery on 12/15/2014 by Dr.Early who did #1 ligation and stripping of right  great saphenous vein from distal thigh to saphenofemoral junction, #2 stab phlebectomy of large tributary varicose veins in the thigh popliteal space and calf. She had an Ace wrap up to her groin and this was removed today and the Unna's boot was also removed. 12/28/2014 -- she is here for her fourth application of Apligraf. 01/06/2015 - he saw her vascular surgeon Dr. Arbie CookeyEarly who was pleased with her progress and he has confirmed that no surgical procedures could be attempted on the left side. 01/13/2015 -- her wound looks very good and she's been having no problems whatsoever. Readmission: 07/26/2020 upon evaluation today patient presents for initial inspection here in our clinic for a new issue with her left leg although  she is previously been seen due to issues with the right leg back in 2016. At that time she was seeing Dr. Arbie Cookey who is a vein/vascular specialist in [place]. He has since semiretired from what I understand. He is working out of [employer] I believe. Nonetheless she tells me at the time that there was really nothing to do for her left leg although the right leg was where they did most of the work. Subsequently she states that she is done fairly well until just in the past week where she had issues with bleeding from what appears to be varicose vein on the left leg medially. Unfortunately this has continued to be an issue although she tells me at first it was coming much more significantly Down quite a bit but nonetheless has not completely resolved. Every time she showers she notices that it starts to drain a little bit more. She does have a history of chronic venous insufficiency, lymphedema, varicose veins bilaterally, and obesity. 08/02/2020 upon evaluation today patient appears to be doing about the same in regards to the ulcer on her left leg. She has some eschar covering there is definitely some fluid collecting underneath unfortunately. With that being said she tells me she  is still having a tremendous amount of pain therefore she is really not able to allow me to clean this off very effectively to be perfectly honest. I think we need to try to soften this up 08/16/2020 upon evaluation today patient's wound is really not doing significantly better not really states about the same. She notes that the wrap just does not seem to be staying up very well at all unfortunately. No fevers, chills, nausea, vomiting, or diarrhea. She did cut it off once it starts to slide in order to alleviate some of the pressure from sliding Down. Fortunately there is no signs of active infection at this time which is great news. 08/23/2020 upon evaluation today patient appears to be doing well 08/23/2020 upon evaluation today patient appears to be doing well with regard to her wound all things considered. Fortunately there does not appear to be any signs of active infection at this time which is great news. She has been tolerating the dressing changes without complication and overall I am extremely pleased with where things stand at this point. She does have her appointment with vascular in [facility] on June 9. 08/30/2020 upon evaluation today patient actually appears to be doing decently well in regard to her wound. Fortunately there is no signs of active infection which is great news. Nonetheless I do believe that the patient is going require little bit of debridement if she is okay with me attempting that today I think that will help clean off some of the necrotic tissue. Fortunately there does not appear to be otherwise any evidence of active infection which is also great news. 09/19/2020 upon evaluation today patient appears to be doing a little better in regard to her wound as compared to previous. Fortunately there does not appear to be any signs of active infection overall. No fever chills noted. I do believe that the Iodosorb has made this a little bit better with regard to the overall size  and appearance of the wound bed though again she does still have quite a ways to go to get this to heal she still very tender to touch. 09/27/2020 upon evaluation today patient appears to actually be doing quite well with regard to her wound. This is measuring much smaller which  is great news. With that being said she did see vein and vascular in [facility] and they subsequently recommended that surgery is really what she probably needs to go forward with sounds like the potential for venous ablation. With that being said the patient tells me this is just not the right time for her to be able to proceed with any type of surgery which I completely understand. Nonetheless I do believe that she would continue to benefit from compression but again that is really not something that she is able to easily do. 10/04/2020 upon evaluation today patient appears to be doing about the same in regard to her wound. This is measuring a little bit smaller but nonetheless still is open and again has some slough and biofilm noted on the surface of the wound. There does not appear to be any signs of active infection which is great news. No fevers, chills, nausea, vomiting, or diarrhea. 10/04/2020 upon evaluation today patient appears to be doing well with regard to her wound. She has been AZELIE, NOGUERA ([id number]) [id number] Page 7 of 9 tolerating dressing changes without complication. Fortunately there does not appear to be any signs of active infection which is great news. No fevers, chills, nausea, vomiting, or diarrhea. 10/25/2020 upon evaluation today patient appears to be doing well with regard to her wound. She has been tolerating the dressing changes without complication. Fortunately there is no signs of active infection at this time. No fevers, chills, nausea, vomiting, or diarrhea. 11/01/2020 upon evaluation today patient with regard to her wound. She has been tolerating the dressing  changes without complication. Fortunately there does not appear to be any signs of infection which is great news. No fever chills noted 11/15/2020 upon evaluation today patient appears to be doing well with regard to her wound. Fortunately there is no signs of active infection at this time. No fevers, chills, nausea, vomiting, or diarrhea. With that being said she continues to have a significant amount of pain at the site even though this is very close to complete closure. She also had several varicose veins around the area which were also problematic. Overall however I feel like the patient is making excellent progress. 11/28/2020 upon evaluation today patient appears to be doing well with regard to her leg ulcer. Again were not really able to debride or compression wrap her due to discomfort and pain. She does not allow for that. With that being said we have been using Iodosorb which does seem to be doing decently well. Fortunately there is no signs of active infection at this time which is great news. No fevers, chills, nausea, vomiting, or diarrhea. 8/31; patient presents for 2-week follow-up. She has been using Iodosorb. She reports that the wound is closed. She denies signs of infection. Readmission: 10-27-2021 upon evaluation this is a patient that presents today whom I have previously seen this is pretty much for the same issue though I think a little bit higher than the last time I saw her. She does have a history of chronic venous insufficiency and hypertension along with varicose veins. Subsequently she does have an ulceration which spontaneously ruptured she has not been wearing any compression which I think is a big part of the issue here. We discussed this before I really think she probably needs to be wearing her compression therapy, she probably needs lymphedema pumps if she can ever wear the compression for a significant amount of time to get these, and subsequently also think that  she  needs to be elevating her legs is much as possible she may even need some vascular intervention in regard to her veins. All of this was reiterated and discussed with her today to reinforce what needs to happen in order to ensure that her legs do not get a lot worse. The patient voiced understanding. She tells me that she knows because she is seeing her mom go through a lot of this as well how bad things can get. 11-03-2021 upon evaluation today patient appears to be doing well with regard to her wound. Fortunately there does not appear to be any signs of active infection at this time. She is measuring a little bit bigger but I think this is because the wound is actually cleaning up a bit here. 7/27; left lateral leg wound not any smaller but perhaps with a cleaner surface. She is using Iodoflex to help with the latter and using Tubigrip. She has chronic venous insufficiency with secondary lymphedema. She is apparently followed by vein and vascular and is being scheduled for an ablation 11-17-2021 upon evaluation today patient appears to be doing well with regard to her wound this is actually showing signs of improvement which is great news. Fortunately I do not see any evidence of active infection locally or systemically at this time which is great news. No fevers, chills, nausea, vomiting, or diarrhea. 11-24-2021 upon evaluation today patient's wound is actually showing signs of significant improvement. Unfortunately she had quite a bit of pain with debridement last week I do believe it was beneficial but at the same time she is doing much better but still really does not want this debrided again I think being that it is looking a whole lot better I would try to avoid that today especially since it caused her so much discomfort that is really not the goal and I explained that to the patient today she voiced understanding and knows that it needed to be done but still states that it was quite  painful. 11-30-2021 upon evaluation today patient appears to be doing excellent in regard to her wound this is actually showing signs of excellent improvement I am very pleased with where things stand. She does have her venous ablation appointment for October 17. 12-21-2021 upon evaluation today patient appears to be doing better in regard to her wound this is measuring smaller and looking better as well. Fortunately I do not see any signs of active infection locally or systemically which is great news. 01-01-2022 upon evaluation today patient appears to be doing well currently in regard to her wound. She is showing signs of improvement which is great news and overall I do not see any signs of active infection locally or systemically at this time. 01-08-2022 upon evaluation today patient appears to be doing well currently in regard to her wound she is actually showing signs of significant improvement which is great news. Fortunately I do not see any evidence of active infection locally or systemically at this time. I do believe that we are on the right track. She also has her appointment October 19 for the venous ablation. 01-16-2022 upon evaluation today patient's wound actually is showing some signs of improvement although this is very slow. Fortunately I do not see any evidence of infection at this time. The volume is a little bit more although the size is smaller I think this is due to the fact that we are slowly cleaning this area out effectively. 11/6 continued improvement the patient  is using Iodoflex and a Tubigrip E. She was supposed to have venous surgery at vein and vascular in [place] however somehow this is gotten delayed till December 14. 02-27-2022 upon evaluation today patient's wound is actually showing signs of being completely healed. Fortunately I do not see any evidence of active infection locally or systemically which is great news and overall I am extremely pleased with where we  stand today. Unfortunately she does tell me that she postpone her venous ablation surgery until December 14 she tells me that she was not ready "financially" for this. 04/18/2022; Ms. Urijah Raynor is a 37 year old female with a past medical history of venous insufficiency that presents the clinic for a left lower extremity wound. She was seen almost 2 months ago for the same wound. This had healed with Iodoflex and Tubigrip. She states that the wound recently reopened. She has seen vein and vascular and plan is for ligation and stripping of the left great saphenous vein. She is not sure when this procedure is going to be scheduled. She has canceled it once before. She has had office compression wraps in the past however these do not stay on and create more of an issue for her. She would like to avoid this. She currently denies signs of infection. 1/10; patient presents for follow-up. She has been using Iodoflex to the wound bed. She states she has been using her Tubigrip however she does not have this on today. She has no issues or complaints today. She denies signs of infection. 1/17; patient presents for follow-up. She has not been using Iodoflex to the wound bed. She reports acute pain. She denies increased warmth, erythema or purulent drainage to the left lower extremity. She states she has been using Tubigrip. She has information to order the compression stockings but has not obtained them. 1/24; Patient had a wound culture done at last clinic visit that grew Proteus mirabilis and E. coli. She was prescribed levofloxacin and this should cover both bacteria. She has been taking the medication over the past week with improvement of symptoms to the wound bed. She has been using Hydrofera Blue under Tubigrip. She states the [facility] is sticking to her wound bed. AWA, BACHICHA ([id number]) [id number] Page 8 of 9 Objective Constitutional Vitals Time Taken: 4:28  PM, Height: 66 in, Weight: 400 lbs, BMI: 64.6, Temperature: 98.4 F, Pulse: 99 bpm, Respiratory Rate: 18 breaths/min, Blood Pressure: 106/75 mmHg. General Notes: Left lower extremity: T the distal medial aspect there is an open wound with granulation tissue and nonviable tissue. No increased warmth, o erythema or purulent drainage. Integumentary (Hair, Skin) Wound #5 status is Open. Original cause of wound was Gradually Appeared. The date acquired was: 04/10/2022. The wound has been in treatment 3 weeks. The wound is located on the Left,Medial Lower Leg. The wound measures 2cm length x 5cm width x 0.2cm depth; 7.854cm^2 area and 1.571cm^3 volume. There is Fat Layer (Subcutaneous Tissue) exposed. There is a medium amount of serosanguineous drainage noted. There is no granulation within the wound bed. There is a large (67-100%) amount of necrotic tissue within the wound bed including Eschar and Adherent Slough. 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 coli sensitive to fluoroquinolones. She has been taking levofloxacin for the past week with benefit. She has  been using Hydrofera Blue under Tubigrip. Because the Hydrofera Blue is sticking I recommended switching to Medihoney. We will order [facility] antibiotic ointment. May benefit from an in office wrap in the near future when she obtains [facility] antibiotic ointment. Plan 1. Medihoney under Tubigrip daily 2. Order [facility] antibiotic 3. Follow-up in 1 week Electronic Signature(s) Signed: 05/09/2022 4:49:46 PM By: Kalman Shan DO Entered By: Kalman Shan on 05/09/2022 16:48:45 -------------------------------------------------------------------------------- SuperBill Details Patient Name: Date of  Service: [name] 05/09/2022 Medical Record Number: [id number] Patient Account Number: [id number] Date of Birth/Sex: Treating RN: 04-13-1986 (37 y.o. [name] Primary Care Provider: [name] Other Clinician: Referring Provider: Treating Provider/Extender: [name] in Treatment: 3 Diagnosis Coding ICD-10 Codes [name], [name] ([id number]) [id number] Page 9 of 9 Code Description I87.312 Chronic venous hypertension (idiopathic) with ulcer of left lower extremity L97.822 Non-pressure chronic ulcer of other part of left lower leg with fat layer exposed I89.0 Lymphedema, not elsewhere classified Facility Procedures : CPT4 Code: [id number] Description: 74827 - WOUND CARE VISIT-LEV 3 EST PT Modifier: Quantity: 1 Physician Procedures : CPT4 Code Description Modifier [id number] - WC PHYS LEVEL 3 - EST PT ICD-10 Diagnosis Description I87.312 Chronic venous hypertension (idiopathic) with ulcer of left lower extremity L97.822 Non-pressure chronic ulcer of other part of left lower leg  with fat layer exposed I89.0 Lymphedema, not elsewhere classified Quantity: 1 Electronic Signature(s) Signed: 05/09/2022 5:30:54 PM By: [name] Signed: 05/10/2022 9:25:06 AM By: Kalman Shan DO Previous Signature: 05/09/2022 4:49:46 PM Version By: Kalman Shan DO Entered By: [name] on 05/09/2022 16:51:20

## 2022-05-10 NOTE — Progress Notes (Signed)
KANCHAN, GAL ([id number]) [id number] Page 1 of 9 Visit Report for 05/09/2022 Arrival Information Details Patient Name: Date of Service: [name], [name] [place] 05/09/2022 3:45 PM Medical Record Number: [id number] Patient Account Number: [id number] Date of Birth/Sex: Treating RN: 13-Jun-1985 (37 y.o. [name] Primary Care Azucena Dart: [name] Other Clinician: Referring Ginnette Gates: Treating Foye Haggart/Extender: Emeline Darling in Treatment: 3 Visit Information History Since Last Visit All ordered tests and consults were completed: No Patient Arrived: Ambulatory Added or deleted any medications: No Arrival Time: 16:27 Any new allergies or adverse reactions: No Transfer Assistance: None Had a fall or experienced change in No Patient Identification Verified: Yes activities of daily living that may affect Secondary Verification Process Completed: Yes risk of falls: Patient Requires Transmission-Based Precautions: No Signs or symptoms of abuse/neglect since last visito No Patient Has Alerts: No Hospitalized since last visit: No Implantable device outside of the clinic excluding No cellular tissue based products placed in the center since last visit: Has Dressing in Place as Prescribed: Yes Has Compression in Place as Prescribed: Yes Pain Present Now: Yes Electronic Signature(s) Signed: 05/09/2022 5:30:54 PM By: [name] Entered By: [name] on 05/09/2022 16:27:53 -------------------------------------------------------------------------------- Clinic Level of Care Assessment Details Patient Name: Date of Service: [name], [name] [place] 05/09/2022 3:45 PM Medical Record Number: [id number] Patient Account Number: [id number] Date of Birth/Sex: Treating RN: April 01, 1986 (37 y.o. [name] Primary Care Luan Maberry: [name] Other Clinician: Referring Asra Gambrel: Treating Fatmata Legere/Extender: Emeline Darling in Treatment: 3 Clinic Level of Care Assessment Items TOOL 4 Quantity Score []  - 0 Use when only an EandM is performed on FOLLOW-UP visit ASSESSMENTS - Nursing Assessment / Reassessment X- 1 10 Reassessment of Co-morbidities (includes updates in patient status) CHELCY, BOLDA ([name]) [phone].pdf Page 2 of 9 X- 1 5 Reassessment of Adherence to Treatment Plan ASSESSMENTS - Wound and Skin A ssessment / Reassessment X - Simple Wound Assessment / Reassessment - one wound 1 5 []  - 0 Complex Wound Assessment / Reassessment - multiple wounds []  - 0 Dermatologic / Skin Assessment (not related to wound area) ASSESSMENTS - Focused Assessment []  - 0 Circumferential Edema Measurements - multi extremities []  - 0 Nutritional Assessment / Counseling / Intervention []  - 0 Lower Extremity Assessment (monofilament, tuning fork, pulses) []  - 0 Peripheral Arterial Disease Assessment (using hand held doppler) ASSESSMENTS - Ostomy and/or Continence Assessment and Care []  - 0 Incontinence Assessment and Management []  - 0 Ostomy Care Assessment and Management (repouching, etc.) PROCESS - Coordination of Care X - Simple Patient / Family Education for ongoing care 1 15 []  - 0 Complex (extensive) Patient / Family Education for ongoing care []  - 0 Staff obtains [id number], Records, T Results / Process Orders est []  - 0 Staff telephones HHA, Nursing Homes / Clarify orders / etc []  - 0 Routine Transfer to another Facility (non-emergent condition) []  - 0 Routine Hospital Admission (non-emergent condition) []  - 0 New Admissions / / Ordering NPWT Apligraf, etc. , []  - 0 Emergency Hospital Admission (emergent condition) X- 1 10 Simple Discharge Coordination []  - 0 Complex (extensive) Discharge Coordination PROCESS - Special Needs []  - 0 Pediatric / Minor Patient Management []  - 0 Isolation Patient Management []  - 0 Hearing  / Language / Visual special needs []  - 0 Assessment of Community assistance (transportation, D/C planning, etc.) []  - 0 Additional assistance / Altered mentation []  - 0 Support Surface(s) Assessment (bed, cushion, seat, etc.) INTERVENTIONS -  Wound Cleansing / Measurement X - Simple Wound Cleansing - one wound 1 5 []  - 0 Complex Wound Cleansing - multiple wounds X- 1 5 Wound Imaging (photographs - any number of wounds) []  - 0 Wound Tracing (instead of photographs) X- 1 5 Simple Wound Measurement - one wound []  - 0 Complex Wound Measurement - multiple wounds INTERVENTIONS - Wound Dressings []  - 0 Small Wound Dressing one or multiple wounds X- 1 15 Medium Wound Dressing one or multiple wounds []  - 0 Large Wound Dressing one or multiple wounds X- 1 5 Application of Medications - topical []  - 0 Application of Medications - injection INTERVENTIONS - Miscellaneous KRIYA, WESTRA ( ) [id number] Page 3 of 9 []  - 0 External ear exam []  - 0 Specimen Collection (cultures, biopsies, blood, body fluids, etc.) []  - 0 Specimen(s) / Culture(s) sent or taken to Lab for analysis []  - 0 Patient Transfer (multiple staff / Lift / Similar devices) []  - 0 Simple Staple / Suture removal (25 or less) []  - 0 Complex Staple / Suture removal (26 or more) []  - 0 Hypo / Hyperglycemic Management (close monitor of Blood Glucose) []  - 0 Ankle / Brachial Index (ABI) - do not check if billed separately X- 1 5 Vital Signs Has the patient been seen at the hospital within the last three years: Yes Total Score: 85 Level Of Care: New/Established - Level 3 Electronic Signature(s) Signed: 05/09/2022 5:30:54 PM By: Entered By: [name] on 05/09/2022 16:51:07 -------------------------------------------------------------------------------- Encounter Discharge Information Details Patient Name: Date of Service: 12-12-1983 MA [name]. 05/09/2022  3:45 PM Medical Record Number: Patient Account Number: Date of Birth/Sex: Treating RN: November 04, 1985 (37 y.o. Primary Care Kadie Balestrieri: Other Clinician: Referring Nayel Purdy: Treating Gurtha Picker/Extender: in Treatment: 3 Encounter Discharge Information Items Discharge Condition: Stable Ambulatory Status: Ambulatory Discharge Destination: Home Transportation: Private Auto Accompanied By: self Schedule Follow-up Appointment: Yes Clinical Summary of Care: Electronic Signature(s) Signed: 05/09/2022 5:30:54 PM By: 05/11/2022 Entered By: [name] on 05/09/2022 16:58:03 Mcnatt, 05/11/2022 (Rhoderick Moody) [id number] Page 4 of 9 -------------------------------------------------------------------------------- Lower Extremity Assessment Details Patient Name: Date of Service: [name], [name] [id number] 05/09/2022 3:45 PM Medical Record Number: [id number] Patient Account Number: [name] Date of Birth/Sex: Treating RN: January 11, 1986 (37 y.o. 05/11/2022 Primary Care Danai Gotto: [name] Other Clinician: Referring Jasin Brazel: Treating Herberta Pickron/Extender: [name] in Treatment: 3 Edema Assessment Assessed: 05/11/2022: Yes] [name]: No] Edema: [Left: Ye] [Right: s] Calf Left: Right: Point of Measurement: 32 cm From Medial Instep 56.4 cm Ankle Left: Right: Point of Measurement: 12 cm From Medial Instep 33.2 cm Vascular Assessment Pulses: Dorsalis Pedis Palpable: [Left:Yes] Electronic Signature(s) Signed: 05/09/2022 5:30:54 PM By: 12-12-1983 Signed: 05/09/2022 5:47:34 PM By: [place], [name], RN, CWS, [name], [name] Entered By: 05/11/2022 on 05/09/2022 16:36:21 -------------------------------------------------------------------------------- Multi Wound Chart Details Patient Name: Date of Service: [id number] MA [name]. 05/09/2022 3:45 PM Medical Record Number:  31 Patient Account Number: [name] Date of Birth/Sex: Treating RN: [birth year] (37 y.o. [name] Primary Care Sanaii Caporaso: [name] Other Clinician: Referring Liberta Gimpel: Treating Demarcus Thielke/Extender: 05/11/2022 in Treatment: 3 Vital Signs Height(in): 66 Pulse(bpm): 99 Weight(lbs): 400 Blood Pressure(mmHg): 106/75 Body Mass Index(BMI): 64.6 Temperature(F): 98.4 Respiratory Rate(breaths/min): 18 [5:Photos:] [N/A:N/A] Left, Medial Lower Leg N/A N/A Wound Location: Gradually Appeared N/A N/A Wounding Event: Venous Leg Ulcer N/A N/A Primary Etiology: Hypertension, Peripheral Venous N/A N/A Comorbid  History: Disease 04/10/2022 N/A N/A Date Acquired: 3 N/A N/A Weeks of Treatment: Open N/A N/A Wound Status: No N/A N/A Wound Recurrence: 2x5x0.2 N/A N/A Measurements L x W x D (cm) 7.854 N/A N/A A (cm) : rea 1.571 N/A N/A Volume (cm) : -42.90% N/A N/A % Reduction in Area: -42.80% N/A N/A % Reduction in Volume: Full Thickness Without Exposed N/A N/A Classification: Support Structures Medium N/A N/A Exudate A [name]: Serosanguineous N/A N/A Exudate Type: red, brown N/A N/A Exudate Color: None Present (0%) N/A N/A Granulation Amount: Large (67-100%) N/A N/A Necrotic Amount: Eschar, Adherent Slough N/A N/A Necrotic Tissue: Fat Layer (Subcutaneous Tissue): Yes N/A N/A Exposed Structures: Fascia: No Tendon: No Muscle: No Joint: No Bone: No None N/A N/A Epithelialization: Treatment Notes Electronic Signature(s) Signed: 05/09/2022 5:30:54 PM By: [name] Entered By: [name] on 05/09/2022 16:36:28 -------------------------------------------------------------------------------- Multi-Disciplinary Care Plan Details Patient Name: Date of Service: [name], [name]. 05/09/2022 3:45 PM Medical Record Number: [id number] Patient Account Number: [id number] Date of Birth/Sex: Treating RN: May 21, 1985 (37 y.o. [name] Primary Care Chandni Gagan: [name] Other Clinician: Referring Shlok Raz: Treating Bintou Lafata/Extender: [name] in Treatment: 3 Active Inactive Necrotic Tissue Nursing Diagnoses: Knowledge deficit related to management of necrotic/devitalized tissue Goals: Patient/caregiver will verbalize understanding of reason and process for debridement of necrotic tissue Date Initiated: 04/18/2022 Target Resolution Date: 05/19/2022 Goal Status: Active Interventions: Assess patient pain level pre-, during and post procedure and prior to discharge Provide education on necrotic tissue and debridement process EARSIE, HUMM ([id number]) [phone].pdf Page 6 of 9 Notes: Venous Leg Ulcer Nursing Diagnoses: Knowledge deficit related to disease process and management Goals: Patient will maintain optimal edema control Date Initiated: 04/18/2022 Target Resolution Date: 05/19/2022 Goal Status: Active Interventions: Assess peripheral edema status every visit. Compression as ordered Notes: Wound/Skin Impairment Nursing Diagnoses: Knowledge deficit related to ulceration/compromised skin integrity Goals: Patient/caregiver will verbalize understanding of skin care regimen Date Initiated: 04/18/2022 Target Resolution Date: 05/19/2022 Goal Status: Active Ulcer/skin breakdown will have a volume reduction of 30% by week 4 Date Initiated: 04/18/2022 Target Resolution Date: 05/19/2022 Goal Status: Active Ulcer/skin breakdown will have a volume reduction of 50% by week 8 Date Initiated: 04/18/2022 Target Resolution Date: 06/18/2022 Goal Status: Active Ulcer/skin breakdown will have a volume reduction of 80% by week 12 Date Initiated: 04/18/2022 Target Resolution Date: 07/18/2022 Goal Status: Active Ulcer/skin breakdown will heal within 14 weeks Date Initiated: 04/18/2022 Target Resolution Date: 08/17/2022 Goal Status: Active Interventions: Assess  patient/caregiver ability to obtain necessary supplies Assess patient/caregiver ability to perform ulcer/skin care regimen upon admission and as needed Assess ulceration(s) every visit Notes: Electronic Signature(s) Signed: 05/09/2022 5:30:54 PM By: [name] Signed: 05/09/2022 5:47:34 PM By: Gretta [place], [name], RN, CWS, [name], [name] Entered By: [name] on 05/09/2022 16:57:29 -------------------------------------------------------------------------------- Pain Assessment Details Patient Name: Date of Service: [name]. 05/09/2022 3:45 PM Medical Record Number: [id number] Patient Account Number: [id number] Date of Birth/Sex: Treating RN: 12/13/1985 (37 y.o. [name] Primary Care Donda Friedli: [name] Other Clinician: Referring Allysen Lazo: Treating Diala Waxman/Extender: [name] in Treatment: [address] Mikail[place], Morel Herminio Heads ([id number]) [id number] Page 7 of 9 Location of Pain Severity and Description of Pain Patient Has Paino Yes Site Locations Pain Location: Pain in Ulcers Duration of the Pain. Constant / Intermittento Constant Rate the pain. Current Pain Level: 8 Character of Pain Describe the Pain: Burning, Throbbing Pain Management and Medication Current Pain Management: Medication: Yes Cold Application:  No Rest: No Massage: No Activity: No T.E.N.S.: No Heat Application: No Leg drop or elevation: No Is the Current Pain Management Adequate: Inadequate How does your wound impact your activities of daily livingo Sleep: No Bathing: No Appetite: No Relationship With Others: No Bladder Continence: No Emotions: No Bowel Continence: No Work: No Toileting: No Drive: No Dressing: No Hobbies: No Electronic Signature(s) Signed: 05/09/2022 5:30:54 PM By: [name] Signed: 05/09/2022 5:47:34 PM By: Gretta Cool, [name], RN, CWS, [name], [name] Entered By: [name] on 05/09/2022  16:30:54 -------------------------------------------------------------------------------- Patient/Caregiver Education Details Patient Name: Date of Service: [name]. 1/24/2024andnbsp3:45 PM Medical Record Number: [id number] Patient Account Number: [id number] Date of Birth/Gender: Treating RN: 11-03-1985 (37 y.o. [name] Primary Care Physician: [name] Other Clinician: Referring Physician: Treating Physician/Extender: [name] in Treatment: 3 Education Assessment Education Provided To: Patient TITIANA, SEVERA ([id number]) [id number] Page 8 of 9 Education Topics Provided Wound/Skin Impairment: Handouts: Other: continue wound care as directed Methods: Explain/Verbal Responses: State content correctly Electronic Signature(s) Signed: 05/09/2022 5:30:54 PM By: [name] Entered By: [name] on 05/09/2022 16:57:25 -------------------------------------------------------------------------------- Wound Assessment Details Patient Name: Date of Service: [name]. 05/09/2022 3:45 PM Medical Record Number: [id number] Patient Account Number: [id number] Date of Birth/Sex: Treating RN: [birth year] (37 y.o. [name] Primary Care Allona Gondek: [name] Other Clinician: Referring Aristeo Hankerson: Treating Genevive Printup/Extender: [name] in Treatment: 3 Wound Status Wound Number: 5 Primary Etiology: Venous Leg Ulcer Wound Location: Left, Medial Lower Leg Wound Status: Open Wounding Event: Gradually Appeared Comorbid History: Hypertension, Peripheral Venous Disease Date Acquired: 04/10/2022 Weeks Of Treatment: 3 Clustered Wound: No Photos Wound Measurements Length: (cm) 2 Width: (cm) 5 Depth: (cm) 0.2 Area: (cm) 7.854 Volume: (cm) 1.571 % Reduction in Area: -42.9% % Reduction in Volume: -42.8% Epithelialization: None Wound Description Classification: Full  Thickness Without Exposed Support Structures Exudate Amount: Medium Exudate Type: Serosanguineous Exudate Color: red, brown Foul Odor After Cleansing: No Slough/Fibrino Yes Wound Bed Granulation Amount: None Present (0%) Exposed Structure Necrotic Amount: Large (67-100%) Fascia Exposed: No Necrotic Quality: Eschar, Adherent Slough Fat Layer (Subcutaneous Tissue) ExposedCECILE, [name] ([id number]) [id number] Page 9 of 9 Tendon Exposed: No Muscle Exposed: No Joint Exposed: No Bone Exposed: No Treatment Notes Wound #5 (Lower Leg) Wound Laterality: Left, Medial Cleanser Byram Ancillary Kit - 15 Day Supply Discharge Instruction: Use supplies as instructed; Kit contains: (15) Saline Bullets; (15) 3x3 Gauze; 15 pr Gloves Peri-Wound Care Topical Activon Honey Gel, 25 (g) Tube Primary Dressing Secondary Dressing (BORDER) Zetuvit Plus SILICONE BORDER Dressing 4x4 (in/in) Discharge Instruction: Please do not put silicone bordered dressings under wraps. Use non-bordered dressing only. Secured With Tubigrip Size F, 4x10 (in/yd) Discharge Instruction: double layer Apply Tubigrip F 3 finger-widths below knee to base of toes to secure dressing and/or for swelling. Compression Wrap Compression Stockings Add-Ons Electronic Signature(s) Signed: 05/09/2022 5:30:54 PM By: [name] Signed: 05/09/2022 5:47:34 PM By: Gretta Cool, [name], RN, CWS, [name], [name] Entered By: [name] on 05/09/2022 16:35:19 -------------------------------------------------------------------------------- Vitals Details Patient Name: Date of Service: [name], [name]. 05/09/2022 3:45 PM Medical Record Number: [id number] Patient Account Number: [id number] Date of Birth/Sex: Treating RN: 10/15/1985 (37 y.o. [name] Primary Care Slayden Mennenga: [name] Other Clinician: Referring Ann Groeneveld: Treating Kesleigh Morson/Extender: [name] in Treatment:  3 Vital Signs Time Taken: 16:28 Temperature (F): 98.4 Height (in): 66 Pulse (bpm): 99 Weight (lbs): 400 Respiratory Rate (breaths/min): 18 Body Mass  Index (BMI): 64.6 Blood Pressure (mmHg): 106/75 Reference Range: 80 - 120 mg / dl Electronic Signature(s) Signed: 05/09/2022 5:30:54 PM By: [name] Entered By: [name] on 05/09/2022 16:30:50

## 2022-05-16 ENCOUNTER — Encounter (HOSPITAL_BASED_OUTPATIENT_CLINIC_OR_DEPARTMENT_OTHER): Payer: BC Managed Care – PPO | Admitting: Internal Medicine

## 2022-05-16 DIAGNOSIS — I87312 Chronic venous hypertension (idiopathic) with ulcer of left lower extremity: Secondary | ICD-10-CM | POA: Diagnosis not present

## 2022-05-16 DIAGNOSIS — L97822 Non-pressure chronic ulcer of other part of left lower leg with fat layer exposed: Secondary | ICD-10-CM | POA: Diagnosis not present

## 2022-05-16 DIAGNOSIS — I890 Lymphedema, not elsewhere classified: Secondary | ICD-10-CM

## 2022-05-17 NOTE — Progress Notes (Signed)
[name], [name] ([id number]) [id number] Page 1 of 10 Visit Report for 05/16/2022 Chief Complaint Document Details Patient Name: Date of Service: [name], [name] [place]. 05/16/2022 3:30 PM Medical Record Number: [id number] Patient Account Number: [id number] Date of Birth/Sex: Treating RN: 1985/10/07 (37 y.o. [name] Primary Care Provider: [name] Other Clinician: [name] Referring Provider: Treating Provider/Extender: Emeline Darling in Treatment: 4 Information Obtained from: Patient Chief Complaint 04/18/2022; Left LE Ulcer Electronic Signature(s) Signed: 05/16/2022 4:26:21 PM By: Geralyn Corwin DO Entered By: Geralyn Corwin on 05/16/2022 16:14:13 -------------------------------------------------------------------------------- Debridement Details Patient Name: Date of Service: [name]. 05/16/2022 3:30 PM Medical Record Number: [id number] Patient Account Number: [id number] Date of Birth/Sex: Treating RN: [birth year] (37 y.o. Cathlean Cower, Kim Primary Care Provider: [name] Other Clinician: [name] Referring Provider: Treating Provider/Extender: Emeline Darling in Treatment: 4 Debridement Performed for Assessment: Wound #5 Left,Medial Lower Leg Performed By: Physician Geralyn Corwin, MD Debridement Type: Debridement Severity of Tissue Pre Debridement: Fat layer exposed Level of Consciousness (Pre-procedure): Awake and Alert Pre-procedure Verification/Time Out Yes - 16:02 Taken: Start Time: 16:02 T Area Debrided (L x W): otal 1.5 (cm) x 3.5 (cm) = 5.25 (cm) Tissue and other material debrided: Viable, Non-Viable, Slough, Subcutaneous, Slough Level: Skin/Subcutaneous Tissue Debridement Description: Excisional Instrument: Curette Bleeding: Minimum Hemostasis Achieved: Pressure Response to Treatment: Procedure was tolerated well Level of Consciousness (Post- Awake  and Alert procedure): [name] ([id number]) [id number] Page 2 of 10 Post Debridement Measurements of Total Wound Length: (cm) 1.5 Width: (cm) 3.5 Depth: (cm) 0.1 Volume: (cm) 0.412 Character of Wound/Ulcer Post Debridement: Stable Severity of Tissue Post Debridement: Fat layer exposed Post Procedure Diagnosis Same as Pre-procedure Electronic Signature(s) Signed: 05/16/2022 4:26:21 PM By: Geralyn Corwin DO Signed: 05/16/2022 4:51:29 PM By: [name] Signed: 05/16/2022 7:35:08 PM By: Elliot Gurney, [name], RN, CWS, [name], [name] Entered By: [name] on 05/16/2022 16:03:41 -------------------------------------------------------------------------------- HPI Details Patient Name: Date of Service: [name]. 05/16/2022 3:30 PM Medical Record Number: [id number] Patient Account Number: [id number] Date of Birth/Sex: Treating RN: 09/14/1985 (37 y.o. [name] Primary Care Provider: [name] Other Clinician: [name] Referring Provider: Treating Provider/Extender: Emeline Darling in Treatment: 4 History of Present Illness HPI Description: 37 year old patient who started with having ulcerations on the right lower leg on the lateral part of her ankle for about 2 weeks. She was seen in the ER at [facility] and advised to see the wound care for a consultation. No X-rays of workup was done during the ER visit and no prescription for any medications of compression wraps were given. the patient is not diabetic but does have hypertension and her medications have been reviewed by me. 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 The right and left greater saphenous vein was found to be tortuous. Deep venous system was also not competent and there was reflux  of more than 500 ms. She was then seen by Dr. Karie Schwalbe Early who recommended that the patient would not benefit from endovenous ablation and he had recommended vein stripping odd on the right side and multiple small phlebectomy procedures on the left side. the patient did not follow-up due to social economic reasons. She has not been wearing any compression stockings and has not taken any  specific treatment for varicose veins for the last 3 years. 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 We have obtained some appointments to see the vascular surgeons by the end of the month and the patient would like to follow up with me [facility] on Wednesday, June 29. 10/14/2014 -- she could not see me yesterday in [place] and hence has come for a review today. She has a vascular work[facility] to be [facility] this [facility]. She is doing fine otherwise. 10/22/2014 -- she was seen by Dr. Brantley Fling Early and he has recommended surgical removal of her right saphenous vein from distal thigh to saphenofemoral odd junction and stab phlebectomy's of multiple large tributary branches throughout her thigh and calf. This would be done under general anesthesia in the outpatient setting. 10/29/2014 -- she is trying to work on a surgical date and in the meanwhile we have got insurance clearance for Apligraf and we will start this next week. 7/22 2016 -- she is here for the first application of Apligraf. 11/19/2014 -- she is here for a second application of Apligraf 11/26/2014 -- she has done fine after her last application of Apligraf and is awaiting her surgery which is scheduled for August 31. 12/03/2014 -- she is doing fine and is here for her third application of Apligraf. 12/21/2014 -- She had surgery on 12/15/2014 by Dr.Early who did #1 ligation and stripping of right great saphenous vein from distal thigh to  saphenofemoral junction, #2 stab phlebectomy of large tributary varicose veins in the thigh popliteal space and calf. She had an Ace wrap up to her groin and this was removed today and the Unna's boot was also removed. 12/28/2014 -- she is here for her fourth application of Apligraf. 01/06/2015 - he saw her vascular surgeon Dr. Arbie CookeyEarly who was pleased with her progress and he has confirmed that no surgical procedures could be attempted on the left side. 01/13/2015 -- her wound looks very good and she's been having no problems whatsoever. Readmission: Conley SimmondsSUMMERS, [name] ([id number]) [id number] Page 3 of 10 07/26/2020 upon evaluation today patient presents for initial inspection here in our clinic for a new issue with her left leg although she is previously been seen due to issues with the right leg back in 2016. At that time she was seeing Dr. Arbie CookeyEarly who is a vein/[place] specialist in [place]. He has since semiretired from what I understand. He is working out of [place] I believe. Nonetheless she tells me at the time that there was really nothing to do for her left leg although the right leg was where they did most of the work. Subsequently she states that she is done fairly well until just in the past week where she had issues with bleeding from what appears to be varicose vein on the left leg medially. Unfortunately this has continued to be an issue although she tells me at first it was coming much more significantly Down quite a bit but nonetheless has not completely resolved. Every time she showers she notices that it starts to drain a little bit more. She does have a history of chronic venous insufficiency, lymphedema, varicose veins bilaterally, and obesity. 08/02/2020 upon evaluation today patient appears to be doing about the same in regards to the ulcer on her left leg. She has some eschar covering there is definitely some fluid collecting underneath  unfortunately. With  that being said she tells me she is still having a tremendous amount of pain therefore she is really not able to allow me to clean this off very effectively to be perfectly honest. I think we need to try to soften this up 08/16/2020 upon evaluation today patient's wound is really not doing significantly better not really states about the same. She notes that the wrap just does not seem to be staying up very well at all unfortunately. No fevers, chills, nausea, vomiting, or diarrhea. She did cut it off once it starts to slide in order to alleviate some of the pressure from sliding Down. Fortunately there is no signs of active infection at this time which is great news. 08/23/2020 upon evaluation today patient appears to be doing well 08/23/2020 upon evaluation today patient appears to be doing well with regard to her wound all things considered. Fortunately there does not appear to be any signs of active infection at this time which is great news. She has been tolerating the dressing changes without complication and overall I am extremely pleased with where things stand at this point. She does have her appointment with vascular in [facility] on June 9. 08/30/2020 upon evaluation today patient actually appears to be doing decently well in regard to her wound. Fortunately there is no signs of active infection which is great news. Nonetheless I do believe that the patient is going require little bit of debridement if she is okay with me attempting that today I think that will help clean off some of the necrotic tissue. Fortunately there does not appear to be otherwise any evidence of active infection which is also great news. 09/19/2020 upon evaluation today patient appears to be doing a little better in regard to her wound as compared to previous. Fortunately there does not appear to be any signs of active infection overall. No fever chills noted. I do believe that the Iodosorb has made this a  little bit better with regard to the overall size and appearance of the wound bed though again she does still have quite a ways to go to get this to heal she still very tender to touch. 09/27/2020 upon evaluation today patient appears to actually be doing quite well with regard to her wound. This is measuring much smaller which is great news. With that being said she did see vein and vascular in [facility] and they subsequently recommended that surgery is really what she probably needs to go forward with sounds like the potential for venous ablation. With that being said the patient tells me this is just not the right time for her to be able to proceed with any type of surgery which I completely understand. Nonetheless I do believe that she would continue to benefit from compression but again that is really not something that she is able to easily do. 10/04/2020 upon evaluation today patient appears to be doing about the same in regard to her wound. This is measuring a little bit smaller but nonetheless still is open and again has some slough and biofilm noted on the surface of the wound. There does not appear to be any signs of active infection which is great news. No fevers, chills, nausea, vomiting, or diarrhea. 10/04/2020 upon evaluation today patient appears to be doing well with regard to her wound. She has been tolerating dressing changes without complication. Fortunately there does not appear to be any signs of active infection which is great news. No fevers, chills, nausea, vomiting, or  diarrhea. 10/25/2020 upon evaluation today patient appears to be doing well with regard to her wound. She has been tolerating the dressing changes without complication. Fortunately there is no signs of active infection at this time. No fevers, chills, nausea, vomiting, or diarrhea. 11/01/2020 upon evaluation today patient with regard to her wound. She has been tolerating the dressing changes without complication.  Fortunately there does not appear to be any signs of infection which is great news. No fever chills noted 11/15/2020 upon evaluation today patient appears to be doing well with regard to her wound. Fortunately there is no signs of active infection at this time. No fevers, chills, nausea, vomiting, or diarrhea. With that being said she continues to have a significant amount of pain at the site even though this is very close to complete closure. She also had several varicose veins around the area which were also problematic. Overall however I feel like the patient is making excellent progress. 11/28/2020 upon evaluation today patient appears to be doing well with regard to her leg ulcer. Again were not really able to debride or compression wrap her due to discomfort and pain. She does not allow for that. With that being said we have been using Iodosorb which does seem to be doing decently well. Fortunately there is no signs of active infection at this time which is great news. No fevers, chills, nausea, vomiting, or diarrhea. 8/31; patient presents for 2-week follow-up. She has been using Iodosorb. She reports that the wound is closed. She denies signs of infection. Readmission: 10-27-2021 upon evaluation this is a patient that presents today whom I have previously seen this is pretty much for the same issue though I think a little bit higher than the last time I saw her. She does have a history of chronic venous insufficiency and hypertension along with varicose veins. Subsequently she does have an ulceration which spontaneously ruptured she has not been wearing any compression which I think is a big part of the issue here. We discussed this before I really think she probably needs to be wearing her compression therapy, she probably needs lymphedema pumps if she can ever wear the compression for a significant amount of time to get these, and subsequently also think that she needs to be elevating her legs  is much as possible she may even need some vascular intervention in regard to her veins. All of this was reiterated and discussed with her today to reinforce what needs to happen in order to ensure that her legs do not get a lot worse. The patient voiced understanding. She tells me that she knows because she is seeing her mom go through a lot of this as well how bad things can get. 11-03-2021 upon evaluation today patient appears to be doing well with regard to her wound. Fortunately there does not appear to be any signs of active infection at this time. She is measuring a little bit bigger but I think this is because the wound is actually cleaning up a bit here. 7/27; left lateral leg wound not any smaller but perhaps with a cleaner surface. She is using Iodoflex to help with the latter and using Tubigrip. She has chronic venous insufficiency with secondary lymphedema. She is apparently followed by vein and vascular and is being scheduled for an ablation 11-17-2021 upon evaluation today patient appears to be doing well with regard to her wound this is actually showing signs of improvement which is great news. Fortunately I do not see  any evidence of active infection locally or systemically at this time which is great news. No fevers, chills, nausea, vomiting, or diarrhea. 11-24-2021 upon evaluation today patient's wound is actually showing signs of significant improvement. Unfortunately she had quite a bit of pain with debridement last week I do believe it was beneficial but at the same time she is doing much better but still really does not want this debrided again I think being that it is looking a whole lot better I would try to avoid that today especially since it caused her so much discomfort that is really not the goal and I explained that to the patient today she voiced understanding and knows that it needed to be done but still states that it was quite painful. 11-30-2021 upon evaluation today  patient appears to be doing excellent in regard to her wound this is actually showing signs of excellent improvement I am very pleased with where things stand. She does have her venous ablation appointment for October 17. 12-21-2021 upon evaluation today patient appears to be doing better in regard to her wound this is measuring smaller and looking better as well. Fortunately I do not see any signs of active infection locally or systemically which is great news. Conley SimmondsSUMMERS, [name] ([id number]) [id number] Page 4 of 10 01-01-2022 upon evaluation today patient appears to be doing well currently in regard to her wound. She is showing signs of improvement which is great news and overall I do not see any signs of active infection locally or systemically at this time. 01-08-2022 upon evaluation today patient appears to be doing well currently in regard to her wound she is actually showing signs of significant improvement which is great news. Fortunately I do not see any evidence of active infection locally or systemically at this time. I do believe that we are on the right track. She also has her appointment October 19 for the venous ablation. 01-16-2022 upon evaluation today patient's wound actually is showing some signs of improvement although this is very slow. Fortunately I do not see any evidence of infection at this time. The volume is a little bit more although the size is smaller I think this is due to the fact that we are slowly cleaning this area out effectively. 11/6 continued improvement the patient is using Iodoflex and a Tubigrip E. She was supposed to have venous surgery at [place] however somehow this is gotten delayed till December 14. 02-27-2022 upon evaluation today patient's wound is actually showing signs of being completely healed. Fortunately I do not see any evidence of active infection locally or systemically which is great news and  overall I am extremely pleased with where we stand today. Unfortunately she does tell me that she postpone her venous ablation surgery until December 14 she tells me that she was not ready "financially" for this. 04/18/2022; Ms. Virgia LandShama Fink is a 37 year old female with a past medical history of venous insufficiency that presents the clinic for a left lower extremity wound. She was seen almost 2 months ago for the same wound. This had healed with Iodoflex and Tubigrip. She states that the wound recently reopened. She has seen vein and vascular and plan is for ligation and stripping of the left great saphenous vein. She is not sure when this procedure is going to be scheduled. She has canceled it once before. She has had office compression wraps in the past however these do not stay on and create more of  an issue for her. She would like to avoid this. She currently denies signs of infection. 1/10; patient presents for follow-up. She has been using Iodoflex to the wound bed. She states she has been using her Tubigrip however she does not have this on today. She has no issues or complaints today. She denies signs of infection. 1/17; patient presents for follow-up. She has not been using Iodoflex to the wound bed. She reports acute pain. She denies increased warmth, erythema or purulent drainage to the left lower extremity. She states she has been using Tubigrip. She has information to order the compression stockings but has not obtained them. 1/24; Patient had a wound culture done at last clinic visit that grew Proteus mirabilis and E. coli. She was prescribed levofloxacin and this should cover both bacteria. She has been taking the medication over the past week with improvement of symptoms to the wound bed. She has been using Hydrofera Blue under Tubigrip. She states the [facility] is sticking to her wound bed. 1/31; patient presents for follow-up. She has been using Medihoney to the wound bed with  improvement in healing. She has been contacted by [facility] to order her antibiotic ointment. She is not sure yet if she is able to afford this. She received her compression stockings but states they did not fit well. She is working on returning these. She has been using Tubigrip. Electronic Signature(s) Signed: 05/16/2022 4:26:21 PM By: Kalman Shan DO Entered By: Kalman Shan on 05/16/2022 16:16:55 -------------------------------------------------------------------------------- Physical Exam Details Patient Name: Date of Service: [name] 05/16/2022 3:30 PM Medical Record Number: [id number] Patient Account Number: [id number] Date of Birth/Sex: Treating RN: 1985-05-27 (37 y.o. [name] Primary Care Provider: [name] Other Clinician: [name] Referring Provider: Treating Provider/Extender: [name] in Treatment: 4 Constitutional . Cardiovascular . Psychiatric . Notes Left lower extremity: T the distal medial aspect there is an open wound with granulation tissue and nonviable tissue. No increased warmth, erythema or o purulent drainage. Electronic Signature(s) Signed: 05/16/2022 4:26:21 PM By: Danton Clap [id number] ByKalman Shan DO [id number] Page 5 of 10 Signed: 05/16/2022 4:26:21 Entered By: Kalman Shan on 05/16/2022 16:17:14 -------------------------------------------------------------------------------- Physician Orders Details Patient Name: Date of Service: [name], DINUNZIO MA [name]. 05/16/2022 3:30 PM Medical Record Number: [id number] Patient Account Number: [id number] Date of Birth/Sex: Treating RN: 04-02-1986 (37 y.o. [name] Primary Care Provider: [name] Other Clinician: [name] Referring Provider: Treating Provider/Extender: [name] in Treatment: 4 Verbal / Phone Orders: No Diagnosis  Coding Follow-up Appointments Return Appointment in 1 week. Bathing/ Shower/ Hygiene May shower; gently cleanse wound with antibacterial soap, rinse and pat dry prior to dressing wounds Anesthetic (Use 'Patient Medications' Section for Anesthetic Order Entry) Lidocaine applied to wound bed Edema Control - Lymphedema / Segmental Compressive Device / Other Elevate, Exercise Daily and A void Standing for Long Periods of Time. Elevate legs to the level of the heart and pump ankles as often as possible Elevate leg(s) parallel to the floor when sitting. Wound Treatment Wound #5 - Lower Leg Wound Laterality: Left, Medial Cleanser: Byram Ancillary Kit - 15 Day Supply (Generic) 1 x Per Day/30 Days Discharge Instructions: Use supplies as instructed; Kit contains: (15) Saline Bullets; (15) 3x3 Gauze; 15 pr Gloves Topical: Activon Honey Gel, 25 (g) Tube 1 x Per Day/30 Days Secondary Dressing: (BORDER) Zetuvit Plus SILICONE BORDER Dressing 4x4 (in/in) (Generic) 1 x Per Day/30 Days Discharge  Instructions: Please do not put silicone bordered dressings under wraps. Use non-bordered dressing only. Secured With: Tubigrip Size F, 4x10 (in/yd) (Generic) 1 x Per Day/30 Days Discharge Instructions: double layer Apply Tubigrip F 3 finger-widths below knee to base of toes to secure dressing and/or for swelling. Electronic Signature(s) Signed: 05/16/2022 4:26:21 PM By: Kalman Shan DO Entered By: Kalman Shan on 05/16/2022 [place], Herminio Heads ([id number]) [id number] Page 6 of 10 -------------------------------------------------------------------------------- Problem List Details Patient Name: Date of Service: TYLENE, QUASHIE [place]. 05/16/2022 3:30 PM Medical Record Number: [id number] Patient Account Number: [id number] Date of Birth/Sex: Treating RN: 1985/08/29 (37 y.o. Charolette Forward, Kim Primary Care Provider: [name] Other Clinician: [name] Referring  Provider: Treating Provider/Extender: [name] in Treatment: 4 Active Problems ICD-10 Encounter Code Description Active Date MDM Diagnosis I87.312 Chronic venous hypertension (idiopathic) with ulcer of left lower extremity 04/18/2022 No Yes L97.822 Non-pressure chronic ulcer of other part of left lower leg with fat layer exposed1/06/2022 No Yes I89.0 Lymphedema, not elsewhere classified 04/18/2022 No Yes Inactive Problems Resolved Problems Electronic Signature(s) Signed: 05/16/2022 4:26:21 PM By: Kalman Shan DO Entered By: Kalman Shan on 05/16/2022 16:14:09 -------------------------------------------------------------------------------- Progress Note Details Patient Name: Date of Service: [name] Corporal MA [name]. 05/16/2022 3:30 PM Medical Record Number: [id number] Patient Account Number: [id number] Date of Birth/Sex: Treating RN: Sep 01, 1985 (37 y.o. [name] Primary Care Provider: [name] Other Clinician: [name] Referring Provider: Treating Provider/Extender: [name] in Treatment: 4 Subjective Chief Complaint Information obtained from Patient 04/18/2022; Left LE Ulcer History of Present Illness (HPI) 37 year old patient who started with having ulcerations on the right lower leg on the lateral part of her ankle for about 2 weeks. She was seen in the ER at [facility] and advised to see the wound care for a consultation. No X-rays of workup was done during the ER visit and no prescription for any medications of compression wraps were given. the patient is not diabetic but does have hypertension and her medications have been reviewed by me. 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 The right and left greater saphenous vein was found to  be tortuous. Deep venous system was also not competent and there was reflux of more than 500 ms. She was then seen by Dr. Darene Lamer Early who recommended that the patient would not benefit from endovenous ablation and he had recommended vein stripping odd on the right side and multiple small phlebectomy procedures on the left side. the patient did not follow-up due to social economic reasons. LAKERA, VIALL ([id number]) [id number] Page 7 of 10 She has not been wearing any compression stockings and has not taken any specific treatment for varicose veins for the last 3 years. 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 We have obtained some appointments to see the vascular surgeons by the end of the month and the patient would like to follow up with me at my [facility] on Wednesday, June 29. 10/14/2014 -- she could not see me yesterday in [place] and hence has come for a review today. She has a vascular workup to be done this afternoon at [facility]. She is doing fine otherwise. 10/22/2014 -- she was seen by  Dr. Brantley Fling and he has recommended surgical removal of her right saphenous vein from distal thigh to saphenofemoral odd junction and stab phlebectomy's of multiple large tributary branches throughout her thigh and calf. This would be done under general anesthesia in the outpatient setting. 10/29/2014 -- she is trying to work on a surgical date and in the meanwhile we have got insurance clearance for Apligraf and we will start this next week. 7/22 2016 -- she is here for the first application of Apligraf. 11/19/2014 -- she is here for a second application of Apligraf 11/26/2014 -- she has done fine after her last application of Apligraf and is awaiting her surgery which is scheduled for August 31. 12/03/2014 -- she is doing fine and is here for her third  application of Apligraf. 12/21/2014 -- She had surgery on 12/15/2014 by Dr.Early who did #1 ligation and stripping of right great saphenous vein from distal thigh to saphenofemoral junction, #2 stab phlebectomy of large tributary varicose veins in the thigh popliteal space and calf. She had an Ace wrap up to her groin and this was removed today and the Unna's boot was also removed. 12/28/2014 -- she is here for her fourth application of Apligraf. 01/06/2015 - he saw her vascular surgeon Dr. Arbie Cookey who was pleased with her progress and he has confirmed that no surgical procedures could be attempted on the left side. 01/13/2015 -- her wound looks very good and she's been having no problems whatsoever. Readmission: 07/26/2020 upon evaluation today patient presents for initial inspection here in our clinic for a new issue with her left leg although she is previously been seen due to issues with the right leg back in 2016. At that time she was seeing Dr. Arbie Cookey who is a vein/vascular specialist in [place]. He has since semiretired from what I understand. He is working out of [employer] I believe. Nonetheless she tells me at the time that there was really nothing to do for her left leg although the right leg was where they did most of the work. Subsequently she states that she is done fairly well until just in the past week where she had issues with bleeding from what appears to be varicose vein on the left leg medially. Unfortunately this has continued to be an issue although she tells me at first it was coming much more significantly Down quite a bit but nonetheless has not completely resolved. Every time she showers she notices that it starts to drain a little bit more. She does have a history of chronic venous insufficiency, lymphedema, varicose veins bilaterally, and obesity. 08/02/2020 upon evaluation today patient appears to be doing about the same in regards to the ulcer on her left leg. She has  some eschar covering there is definitely some fluid collecting underneath unfortunately. With that being said she tells me she is still having a tremendous amount of pain therefore she is really not able to allow me to clean this off very effectively to be perfectly honest. I think we need to try to soften this up 08/16/2020 upon evaluation today patient's wound is really not doing significantly better not really states about the same. She notes that the wrap just does not seem to be staying up very well at all unfortunately. No fevers, chills, nausea, vomiting, or diarrhea. She did cut it off once it starts to slide in order to alleviate some of the pressure from sliding Down. Fortunately there is no signs of active infection at this time  which is great news. 08/23/2020 upon evaluation today patient appears to be doing well 08/23/2020 upon evaluation today patient appears to be doing well with regard to her wound all things considered. Fortunately there does not appear to be any signs of active infection at this time which is great news. She has been tolerating the dressing changes without complication and overall I am extremely pleased with where things stand at this point. She does have her appointment with vascular in [facility] on June 9. 08/30/2020 upon evaluation today patient actually appears to be doing decently well in regard to her wound. Fortunately there is no signs of active infection which is great news. Nonetheless I do believe that the patient is going require little bit of debridement if she is okay with me attempting that today I think that will help clean off some of the necrotic tissue. Fortunately there does not appear to be otherwise any evidence of active infection which is also great news. 09/19/2020 upon evaluation today patient appears to be doing a little better in regard to her wound as compared to previous. Fortunately there does not appear to be any signs of active infection  overall. No fever chills noted. I do believe that the Iodosorb has made this a little bit better with regard to the overall size and appearance of the wound bed though again she does still have quite a ways to go to get this to heal she still very tender to touch. 09/27/2020 upon evaluation today patient appears to actually be doing quite well with regard to her wound. This is measuring much smaller which is great news. With that being said she did see vein and vascular in [facility] and they subsequently recommended that surgery is really what she probably needs to go forward with sounds like the potential for venous ablation. With that being said the patient tells me this is just not the right time for her to be able to proceed with any type of surgery which I completely understand. Nonetheless I do believe that she would continue to benefit from compression but again that is really not something that she is able to easily do. 10/04/2020 upon evaluation today patient appears to be doing about the same in regard to her wound. This is measuring a little bit smaller but nonetheless still is open and again has some slough and biofilm noted on the surface of the wound. There does not appear to be any signs of active infection which is great news. No fevers, chills, nausea, vomiting, or diarrhea. 10/04/2020 upon evaluation today patient appears to be doing well with regard to her wound. She has been tolerating dressing changes without complication. Fortunately there does not appear to be any signs of active infection which is great news. No fevers, chills, nausea, vomiting, or diarrhea. 10/25/2020 upon evaluation today patient appears to be doing well with regard to her wound. She has been tolerating the dressing changes without complication. Fortunately there is no signs of active infection at this time. No fevers, chills, nausea, vomiting, or diarrhea. 11/01/2020 upon evaluation today patient with regard to  her wound. She has been tolerating the dressing changes without complication. Fortunately there does not appear to be any signs of infection which is great news. No fever chills noted 11/15/2020 upon evaluation today patient appears to be doing well with regard to her wound. Fortunately there is no signs of active infection at this time. No fevers, chills, nausea, vomiting, or diarrhea. With that being said  she continues to have a significant amount of pain at the site even though this is very close to complete closure. She also had several varicose veins around the area which were also problematic. Overall however I feel like the patient is making excellent progress. 11/28/2020 upon evaluation today patient appears to be doing well with regard to her leg ulcer. Again were not really able to debride or compression wrap her due to discomfort and pain. She does not allow for that. With that being said we have been using Iodosorb which does seem to be doing decently well. Fortunately there is no signs of active infection at this time which is great news. No fevers, chills, nausea, vomiting, or diarrhea. 8/31; patient presents for 2-week follow-up. She has been using Iodosorb. She reports that the wound is closed. She denies signs of infection. Readmission: 10-27-2021 upon evaluation this is a patient that presents today whom I have previously seen this is pretty much for the same issue though I think a little bit higher than the last time I saw her. She does have a history of chronic venous insufficiency and hypertension along with varicose veins. Subsequently she Conley SimmondsSUMMERS, Joyia [name] ([id number]) [id number] Page 8 of 10 does have an ulceration which spontaneously ruptured she has not been wearing any compression which I think is a big part of the issue here. We discussed this before I really think she probably needs to be wearing her compression therapy, she probably needs lymphedema  pumps if she can ever wear the compression for a significant amount of time to get these, and subsequently also think that she needs to be elevating her legs is much as possible she may even need some vascular intervention in regard to her veins. All of this was reiterated and discussed with her today to reinforce what needs to happen in order to ensure that her legs do not get a lot worse. The patient voiced understanding. She tells me that she knows because she is seeing her mom go through a lot of this as well how bad things can get. 11-03-2021 upon evaluation today patient appears to be doing well with regard to her wound. Fortunately there does not appear to be any signs of active infection at this time. She is measuring a little bit bigger but I think this is because the wound is actually cleaning up a bit here. 7/27; left lateral leg wound not any smaller but perhaps with a cleaner surface. She is using Iodoflex to help with the latter and using Tubigrip. She has chronic venous insufficiency with secondary lymphedema. She is apparently followed by vein and vascular and is being scheduled for an ablation 11-17-2021 upon evaluation today patient appears to be doing well with regard to her wound this is actually showing signs of improvement which is great news. Fortunately I do not see any evidence of active infection locally or systemically at this time which is great news. No fevers, chills, nausea, vomiting, or diarrhea. 11-24-2021 upon evaluation today patient's wound is actually showing signs of significant improvement. Unfortunately she had quite a bit of pain with debridement last week I do believe it was beneficial but at the same time she is doing much better but still really does not want this debrided again I think being that it is looking a whole lot better I would try to avoid that today especially since it caused her so much discomfort that is really not the goal and I explained that to  the patient today she voiced understanding and knows that it needed to be done but still states that it was quite painful. 11-30-2021 upon evaluation today patient appears to be doing excellent in regard to her wound this is actually showing signs of excellent improvement I am very pleased with where things stand. She does have her venous ablation appointment for October 17. 12-21-2021 upon evaluation today patient appears to be doing better in regard to her wound this is measuring smaller and looking better as well. Fortunately I do not see any signs of active infection locally or systemically which is great news. 01-01-2022 upon evaluation today patient appears to be doing well currently in regard to her wound. She is showing signs of improvement which is great news and overall I do not see any signs of active infection locally or systemically at this time. 01-08-2022 upon evaluation today patient appears to be doing well currently in regard to her wound she is actually showing signs of significant improvement which is great news. Fortunately I do not see any evidence of active infection locally or systemically at this time. I do believe that we are on the right track. She also has her appointment October 19 for the venous ablation. 01-16-2022 upon evaluation today patient's wound actually is showing some signs of improvement although this is very slow. Fortunately I do not see any evidence of infection at this time. The volume is a little bit more although the size is smaller I think this is due to the fact that we are slowly cleaning this area out effectively. 11/6 continued improvement the patient is using Iodoflex and a Tubigrip E. She was supposed to have venous surgery at vein and vascular in [place] however somehow this is gotten delayed till December 14. 02-27-2022 upon evaluation today patient's wound is actually showing signs of being completely healed. Fortunately I do not see any evidence  of active infection locally or systemically which is great news and overall I am extremely pleased with where we stand today. Unfortunately she does tell me that she postpone her venous ablation surgery until December 14 she tells me that she was not ready "financially" for this. 04/18/2022; [name] is a 37 year old female with a past medical history of venous insufficiency that presents the clinic for a left lower extremity wound. She was seen almost 2 months ago for the same wound. This had healed with Iodoflex and Tubigrip. She states that the wound recently reopened. She has seen vein and vascular and plan is for ligation and stripping of the left great saphenous vein. She is not sure when this procedure is going to be scheduled. She has canceled it once before. She has had office compression wraps in the past however these do not stay on and create more of an issue for her. She would like to avoid this. She currently denies signs of infection. 1/10; patient presents for follow-up. She has been using Iodoflex to the wound bed. She states she has been using her Tubigrip however she does not have this on today. She has no issues or complaints today. She denies signs of infection. 1/17; patient presents for follow-up. She has not been using Iodoflex to the wound bed. She reports acute pain. She denies increased warmth, erythema or purulent drainage to the left lower extremity. She states she has been using Tubigrip. She has information to order the compression stockings but has not obtained them. 1/24; Patient had a wound culture done at last  clinic visit that grew Proteus mirabilis and E. coli. She was prescribed levofloxacin and this should cover both bacteria. She has been taking the medication over the past week with improvement of symptoms to the wound bed. She has been using Hydrofera Blue under Tubigrip. She states the [facility] is sticking to her wound bed. 1/31; patient  presents for follow-up. She has been using Medihoney to the wound bed with improvement in healing. She has been contacted by [facility] to order her antibiotic ointment. She is not sure yet if she is able to afford this. She received her compression stockings but states they did not fit well. She is working on returning these. She has been using Tubigrip. Objective Constitutional Vitals Time Taken: 3:43 PM, Height: 66 in, Weight: 400 lbs, BMI: 64.6, Temperature: 98.2 F, Pulse: 79 bpm, Respiratory Rate: 18 breaths/min, Blood Pressure: 124/78 mmHg. General Notes: Left lower extremity: T the distal medial aspect there is an open wound with granulation tissue and nonviable tissue. No increased warmth, o erythema or purulent drainage. Integumentary (Hair, Skin) Wound #5 status is Open. Original cause of wound was Gradually Appeared. The date acquired was: 04/10/2022. The wound has been in treatment 4 weeks. The wound is located on the Left,Medial Lower Leg. The wound measures 1.5cm length x 3.5cm width x 0.1cm depth; 4.123cm^2 area and 0.412cm^3 volume. There is Fat Layer (Subcutaneous Tissue) exposed. There is a medium amount of serosanguineous drainage noted. There is no granulation within the wound bed. There is a large (67-100%) amount of necrotic tissue within the wound bed including Eschar and Adherent Slough. 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 I debrided nonviable tissue. At this time I recommended continue Medihoney with Tubigrip. Once she obtains her compression stockings I recommended using these daily. If she is able to obtain  [facility] antibiotic ointment I recommended starting this. Procedures Wound #5 Pre-procedure diagnosis of Wound #5 is a Venous Leg Ulcer located on the Left,Medial Lower Leg .Severity of Tissue Pre Debridement is: Fat layer exposed. There was a Excisional Skin/Subcutaneous Tissue Debridement with a total area of 5.25 sq cm performed by Kalman Shan, MD. With the following instrument(s): Curette to remove Viable and Non-Viable tissue/material. Material removed includes Subcutaneous Tissue and Slough and. A time out was conducted at 16:02, prior to the start of the procedure. A Minimum amount of bleeding was controlled with Pressure. The procedure was tolerated well. Post Debridement Measurements: 1.5cm length x 3.5cm width x 0.1cm depth; 0.412cm^3 volume. Character of Wound/Ulcer Post Debridement is stable. Severity of Tissue Post Debridement is: Fat layer exposed. Post procedure Diagnosis Wound #5: Same as Pre-Procedure Plan Follow-up Appointments: Return Appointment in 1 week. Bathing/ Shower/ Hygiene: May shower; gently cleanse wound with antibacterial soap, rinse and pat dry prior to dressing wounds Anesthetic (Use 'Patient Medications' Section for Anesthetic Order Entry): Lidocaine applied to wound bed Edema Control - Lymphedema / Segmental Compressive Device / Other: Elevate, Exercise Daily and Avoid Standing for Long Periods of Time. Elevate legs to the level of the heart and pump ankles as often as possible Elevate leg(s) parallel to the floor when sitting. WOUND #5: - Lower Leg Wound Laterality: Left, Medial Cleanser: Byram Ancillary Kit - 15 Day Supply (  Generic) 1 x Per Day/30 Days Discharge Instructions: Use supplies as instructed; Kit contains: (15) Saline Bullets; (15) 3x3 Gauze; 15 pr Gloves Topical: Activon Honey Gel, 25 (g) Tube 1 x Per Day/30 Days Secondary Dressing: (BORDER) Zetuvit Plus SILICONE BORDER Dressing 4x4 (in/in) (Generic) 1 x Per Day/30 Days Discharge  Instructions: Please do not put silicone bordered dressings under wraps. Use non-bordered dressing only. Secured With: Tubigrip Size F, 4x10 (in/yd) (Generic) 1 x Per Day/30 Days Discharge Instructions: double layer Apply Tubigrip F 3 finger-widths below knee to base of toes to secure dressing and/or for swelling. 1. In office sharp debridement 2. Medihoney 3. Start [facility] antibiotic ointment once this arrives 4. Tubigrip 5. Follow-up in 1 week Electronic Signature(s) Signed: 05/16/2022 4:26:21 PM By: Geralyn Corwin DO Entered By: Geralyn Corwin on 05/16/2022 16:18:40 [name], [name] ([id number]) [id number] Page 10 of 10 -------------------------------------------------------------------------------- SuperBill Details Patient Name: Date of Service: ZAHRIAH, ROES [place] 05/16/2022 Medical Record Number: [id number] Patient Account Number: [id number] Date of Birth/Sex: Treating RN: 09-Sep-1985 (37 y.o. Cathlean Cower, Kim Primary Care Provider: [name] Other Clinician: [name] Referring Provider: Treating Provider/Extender: Emeline Darling in Treatment: 4 Diagnosis Coding ICD-10 Codes Code Description [phone] Chronic venous hypertension (idiopathic) with ulcer of left lower extremity L97.822 Non-pressure chronic ulcer of other part of left lower leg with fat layer exposed I89.0 Lymphedema, not elsewhere classified Facility Procedures : CPT4 Code: [id number] Description: 11042 - DEB SUBQ TISSUE 20 SQ CM/< ICD-10 Diagnosis Description I87.312 Chronic venous hypertension (idiopathic) with ulcer of left lower extremity L97.822 Non-pressure chronic ulcer of other part of left lower leg with fat layer expo I89.0  Lymphedema, not elsewhere classified Modifier: sed Quantity: 1 Physician Procedures : CPT4 Code Description Modifier [id number] 11042 - WC PHYS SUBQ TISS 20 SQ CM ICD-10 Diagnosis Description I87.312 Chronic venous  hypertension (idiopathic) with ulcer of left lower extremity L97.822 Non-pressure chronic ulcer of other part of left lower  leg with fat layer exposed I89.0 Lymphedema, not elsewhere classified Quantity: 1 Electronic Signature(s) Signed: 05/16/2022 4:26:21 PM By: Geralyn Corwin DO Entered By: Geralyn Corwin on 05/16/2022 16:19:07

## 2022-05-17 NOTE — Progress Notes (Signed)
[name], [name] ([id number]) [id number] Page 1 of 9 Visit Report for 05/16/2022 Arrival Information Details Patient Name: Date of Service: [name], [place] [name]. 05/16/2022 3:30 PM Medical Record Number: [id number] Patient Account Number: [id number] Date of Birth/Sex: Treating RN: 1986/04/06 (37 y.o. [name], Kim Primary Care [name]: [name] Other Clinician: Massie [name] Referring [name]: Treating [name]/Extender: [name] in Treatment: 4 Visit Information History Since Last Visit All ordered tests and consults were completed: No Patient Arrived: Ambulatory Added or deleted any medications: No Arrival Time: 15:42 Any new allergies or adverse reactions: No Transfer Assistance: None Had a fall or experienced change in No Patient Requires Transmission-Based Precautions: No activities of daily living that may affect Patient Has Alerts: No risk of falls: Signs or symptoms of abuse/neglect since last visito No Hospitalized since last visit: No Implantable device outside of the clinic excluding No cellular tissue based products placed in the center since last visit: Has Dressing in Place as Prescribed: Yes Has Compression in Place as Prescribed: Yes Pain Present Now: Yes Electronic Signature(s) Signed: 05/16/2022 4:51:29 PM By: [name] Entered By: [name] on 05/16/2022 15:43:21 -------------------------------------------------------------------------------- Clinic Level of Care Assessment Details Patient Name: Date of Service: [name], [name] 05/16/2022 3:30 PM Medical Record Number: [id number] Patient Account Number: [id number] Date of Birth/Sex: Treating RN: [birth year] (37 y.o. [name] Primary Care [name]: [name] Other Clinician: Massie [name] Referring [name]: Treating [name]/Extender: [name] in Treatment: 4 Clinic Level of Care  Assessment Items TOOL 1 Quantity Score []  - 0 Use when EandM and Procedure is performed on INITIAL visit ASSESSMENTS - Nursing Assessment / Reassessment []  - 0 General Physical Exam (combine w/ comprehensive assessment (listed just below) when performed on new pt. [address], [name] ([id number]) [id number] Page 2 of 9 []  - 0 Comprehensive Assessment (HX, ROS, Risk Assessments, Wounds Hx, etc.) ASSESSMENTS - Wound and Skin Assessment / Reassessment []  - 0 Dermatologic / Skin Assessment (not related to wound area) ASSESSMENTS - Ostomy and/or Continence Assessment and Care []  - 0 Incontinence Assessment and Management []  - 0 Ostomy Care Assessment and Management (repouching, etc.) PROCESS - Coordination of Care []  - 0 Simple Patient / Family Education for ongoing care []  - 0 Complex (extensive) Patient / Family Education for ongoing care []  - 0 Staff obtains Programmer, systems, Records, T Results / Process Orders est []  - 0 Staff telephones HHA, Nursing Homes / Clarify orders / etc []  - 0 Routine Transfer to another Facility (non-emergent condition) []  - 0 Routine Hospital Admission (non-emergent condition) []  - 0 New Admissions / Biomedical engineer / Ordering NPWT Apligraf, etc. , []  - 0 Emergency Hospital Admission (emergent condition) PROCESS - Special Needs []  - 0 Pediatric / Minor Patient Management []  - 0 Isolation Patient Management []  - 0 Hearing / Language / Visual special needs []  - 0 Assessment of Community assistance (transportation, D/C planning, etc.) []  - 0 Additional assistance / Altered mentation []  - 0 Support Surface(s) Assessment (bed, cushion, seat, etc.) INTERVENTIONS - Miscellaneous []  - 0 External ear exam []  - 0 Patient Transfer (multiple staff / Civil Service fast streamer / Similar devices) []  - 0 Simple Staple / Suture removal (25 or less) []  - 0 Complex Staple / Suture removal (26 or more) []  - 0 Hypo/Hyperglycemic  Management (do not check if billed separately) []  - 0 Ankle / Brachial Index (ABI) - do not check if billed separately Has the patient been seen at  the hospital within the last three years: Yes Total Score: 0 Level Of Care: ____ Electronic Signature(s) Signed: 05/16/2022 4:51:29 PM By: [name] Entered By: [name] on 05/16/2022 16:04:18 -------------------------------------------------------------------------------- Encounter Discharge Information Details Patient Name: Date of Service: [name] Moody MA J. 05/16/2022 3:30 PM Medical Record Number: [id number] Patient Account Number: [id number] Date of Birth/Sex: Treating RN: 05/26/1985 (37 y.o. [name] Primary Care [name]: [name] Other Clinician: [name], [name] ([id number]) [id number] Page 3 of 9 Referring Maximum Reiland: Treating Chey Cho/Extender: Emeline Darling in Treatment: 4 Encounter Discharge Information Items Post Procedure Vitals Discharge Condition: Stable Temperature (F): 98.2 Ambulatory Status: Ambulatory Pulse (bpm): 79 Discharge Destination: Home Respiratory Rate (breaths/min): 18 Transportation: Private Auto Blood Pressure (mmHg): 124/78 Accompanied By: self Schedule Follow-up Appointment: Yes Clinical Summary of Care: Electronic Signature(s) Signed: 05/16/2022 4:51:29 PM By: [name] Entered By: [name] on 05/16/2022 16:14:29 -------------------------------------------------------------------------------- Lower Extremity Assessment Details Patient Name: Date of Service: [name], [name] 05/16/2022 3:30 PM Medical Record Number: [id number] Patient Account Number: [id number] Date of Birth/Sex: Treating RN: January 28, 1986 (37 y.o. [name] Primary Care [name]: [name] Other Clinician: Betha [name] Referring Jerianne Anselmo: Treating Kila Godina/Extender: Emeline Darling in  Treatment: 4 Edema Assessment Assessed: [name]: Yes] [name]: No] Edema: [Left: Ye] [Right: s] Calf Left: Right: Point of Measurement: 32 cm From Medial Instep 58 cm Ankle Left: Right: Point of Measurement: 12 cm From Medial Instep 33.4 cm Vascular Assessment Pulses: Dorsalis Pedis Palpable: [Left:Yes] Electronic Signature(s) Signed: 05/16/2022 4:51:29 PM By: [name] Signed: 05/16/2022 7:35:08 PM By: Elliot Gurney, [name], RN, CWS, [name], [name] Entered By: [name] on 05/16/2022 15:56:26 Manders, [name] ([id number]) [id number] Page 4 of 9 -------------------------------------------------------------------------------- Multi Wound Chart Details Patient Name: Date of Service: [name], [name] West [name]. 05/16/2022 3:30 PM Medical Record Number: [id number] Patient Account Number: [id number] Date of Birth/Sex: Treating RN: Oct 04, 1985 (37 y.o. Cathlean Cower, Kim Primary Care Merdith Boyd: [name] Other Clinician: Betha [name] Referring Shaasia Odle: Treating Kiandre Spagnolo/Extender: Emeline Darling in Treatment: 4 Vital Signs Height(in): 66 Pulse(bpm): 79 Weight(lbs): 400 Blood Pressure(mmHg): 124/78 Body Mass Index(BMI): 64.6 Temperature(F): 98.2 Respiratory Rate(breaths/min): 18 [5:Photos:] [N/A:N/A] Left, Medial Lower Leg N/A N/A Wound Location: Gradually Appeared N/A N/A Wounding Event: Venous Leg Ulcer N/A N/A Primary Etiology: Hypertension, Peripheral Venous N/A N/A Comorbid History: Disease 04/10/2022 N/A N/A Date Acquired: 4 N/A N/A Weeks of Treatment: Open N/A N/A Wound Status: No N/A N/A Wound Recurrence: 1.5x3.5x0.1 N/A N/A Measurements L x W x D (cm) 4.123 N/A N/A A (cm) : rea 0.412 N/A N/A Volume (cm) : 25.00% N/A N/A % Reduction in Area: 62.50% N/A N/A % Reduction in Volume: Full Thickness Without Exposed N/A N/A Classification: Support Structures Medium N/A N/A Exudate A mount: Serosanguineous N/A  N/A Exudate Type: red, brown N/A N/A Exudate Color: None Present (0%) N/A N/A Granulation Amount: Large (67-100%) N/A N/A Necrotic Amount: Eschar, Adherent Slough N/A N/A Necrotic Tissue: Fat Layer (Subcutaneous Tissue): Yes N/A N/A Exposed Structures: Fascia: No Tendon: No Muscle: No Joint: No Bone: No None N/A N/A Epithelialization: Treatment Notes Electronic Signature(s) Signed: 05/16/2022 4:51:29 PM By: [name] Entered By: [name] on 05/16/2022 15:56:44 Staten, [name] ([id number]) [id number] Page 5 of 9 -------------------------------------------------------------------------------- Multi-Disciplinary Care Plan Details Patient Name: Date of Service: [name], [name] West [name]. 05/16/2022 3:30 PM Medical Record Number: [id number] Patient Account Number: [id number] Date of Birth/Sex: Treating RN: 1985/10/14 (37 y.o. [name] Primary Care Verniece Encarnacion: Velna Ochs,  Legrand Como Other Clinician: Massie [name] Referring Vallorie Niccoli: Treating Braxdon Gappa/Extender: [name] in Treatment: 4 Active Inactive Necrotic Tissue Nursing Diagnoses: Knowledge deficit related to management of necrotic/devitalized tissue Goals: Patient/caregiver will verbalize understanding of reason and process for debridement of necrotic tissue Date Initiated: 04/18/2022 Target Resolution Date: 05/19/2022 Goal Status: Active Interventions: Assess patient pain level pre-, during and post procedure and prior to discharge Provide education on necrotic tissue and debridement process Notes: Venous Leg Ulcer Nursing Diagnoses: Knowledge deficit related to disease process and management Goals: Patient will maintain optimal edema control Date Initiated: 04/18/2022 Target Resolution Date: 05/19/2022 Goal Status: Active Interventions: Assess peripheral edema status every visit. Compression as ordered Notes: Wound/Skin Impairment Nursing  Diagnoses: Knowledge deficit related to ulceration/compromised skin integrity Goals: Patient/caregiver will verbalize understanding of skin care regimen Date Initiated: 04/18/2022 Target Resolution Date: 05/19/2022 Goal Status: Active Ulcer/skin breakdown will have a volume reduction of 30% by week 4 Date Initiated: 04/18/2022 Target Resolution Date: 05/19/2022 Goal Status: Active Ulcer/skin breakdown will have a volume reduction of 50% by week 8 Date Initiated: 04/18/2022 Target Resolution Date: 06/18/2022 Goal Status: Active Ulcer/skin breakdown will have a volume reduction of 80% by week 12 Date Initiated: 04/18/2022 Target Resolution Date: 07/18/2022 Goal Status: Active Ulcer/skin breakdown will heal within 14 weeks Date Initiated: 04/18/2022 Target Resolution Date: 08/17/2022 [name], [name] ([id number]) [id number] Page 6 of 9 Goal Status: Active Interventions: Assess patient/caregiver ability to obtain necessary supplies Assess patient/caregiver ability to perform ulcer/skin care regimen upon admission and as needed Assess ulceration(s) every visit Notes: Electronic Signature(s) Signed: 05/16/2022 4:51:29 PM By: [name] Signed: 05/16/2022 7:35:08 PM By: Gretta Cool, [name], RN, CWS, [name], [name] Entered By: [name] on 05/16/2022 16:04:59 -------------------------------------------------------------------------------- Pain Assessment Details Patient Name: Date of Service: [name] Corporal MA J. 05/16/2022 3:30 PM Medical Record Number: [id number] Patient Account Number: [id number] Date of Birth/Sex: Treating RN: 1985-11-29 (37 y.o. [name] Primary Care Oceana Walthall: [name] Other Clinician: Massie [name] Referring Norm Wray: Treating Parthena Fergeson/Extender: [name] in Treatment: 4 Active Problems Location of Pain Severity and Description of Pain Patient Has Paino Yes Site Locations Pain Location: Pain in Ulcers With  Dressing Change: Yes Duration of the Pain. Constant / Intermittento Constant Rate the pain. Current Pain Level: 6 Character of Pain Describe the Pain: Throbbing Pain Management and Medication Current Pain Management: Medication: Yes Cold Application: No Rest: No Massage: No Activity: No T.E.N.S.: No Heat Application: No Leg drop or elevation: No Is the Current Pain Management Adequate: Inadequate How does your wound impact your activities of daily livingo Sleep: No Bathing: No Appetite: No Relationship With Others: No Bladder Continence: No Emotions: No [name], [name] ([id number]) [id number] Page 7 of 9 Bowel Continence: No Work: No Toileting: No Drive: No Dressing: No Hobbies: No Electronic Signature(s) Signed: 05/16/2022 4:51:29 PM By: [name] Signed: 05/16/2022 7:35:08 PM By: Gretta Cool, [name], RN, CWS, [name], [name] Entered By: [name] on 05/16/2022 15:47:04 -------------------------------------------------------------------------------- Patient/Caregiver Education Details Patient Name: Date of Service: [name] Corporal MA Lenna Sciara 1/31/2024andnbsp3:30 PM Medical Record Number: [id number] Patient Account Number: [id number] Date of Birth/Gender: Treating RN: 11-05-1985 (37 y.o. [name] Primary Care Physician: [name] Other Clinician: Massie [name] Referring Physician: Treating Physician/Extender: [name] in Treatment: 4 Education Assessment Education Provided To: Patient Education Topics Provided Wound/Skin Impairment: Handouts: Other: continue wound care as directed Methods: Explain/Verbal Responses: State content correctly Electronic Signature(s) Signed: 05/16/2022 4:51:29 PM By: [name] Entered By:  [name] on 05/16/2022 16:04:54 -------------------------------------------------------------------------------- Wound Assessment Details Patient Name: Date of  Service: [name], [name] [name]. 05/16/2022 3:30 PM Medical Record Number: [id number] Patient Account Number: [id number] Date of Birth/Sex: Treating RN: 04-Nov-1985 (37 y.o. [name] Primary Care Brenyn Petrey: [name] Other Clinician: Massie [name] Referring Laquincy Eastridge: Treating Caige Almeda/Extender: [name] in Treatment: 4 Wound Status Wound Number: 5 Primary Etiology: Venous Leg Ulcer VERENISE, MOULIN ([id number]) [id number] Page 8 of 9 Wound Location: Left, Medial Lower Leg Wound Status: Open Wounding Event: Gradually Appeared Comorbid History: Hypertension, Peripheral Venous Disease Date Acquired: 04/10/2022 Weeks Of Treatment: 4 Clustered Wound: No Photos Wound Measurements Length: (cm) 1.5 Width: (cm) 3.5 Depth: (cm) 0.1 Area: (cm) 4.123 Volume: (cm) 0.412 % Reduction in Area: 25% % Reduction in Volume: 62.5% Epithelialization: None Wound Description Classification: Full Thickness Without Exposed Support Structures Exudate Amount: Medium Exudate Type: Serosanguineous Exudate Color: red, brown Foul Odor After Cleansing: No Slough/Fibrino Yes Wound Bed Granulation Amount: None Present (0%) Exposed Structure Necrotic Amount: Large (67-100%) Fascia Exposed: No Necrotic Quality: Eschar, Adherent Slough Fat Layer (Subcutaneous Tissue) Exposed: Yes Tendon Exposed: No Muscle Exposed: No Joint Exposed: No Bone Exposed: No Treatment Notes Wound #5 (Lower Leg) Wound Laterality: Left, Medial Cleanser Byram Ancillary Kit - 15 Day Supply Discharge Instruction: Use supplies as instructed; Kit contains: (15) Saline Bullets; (15) 3x3 Gauze; 15 pr Gloves Peri-Wound Care Topical Activon Honey Gel, 25 (g) Tube Primary Dressing Secondary Dressing (BORDER) Zetuvit Plus SILICONE BORDER Dressing 4x4 (in/in) Discharge Instruction: Please do not put silicone bordered dressings under wraps. Use non-bordered dressing  only. Secured With Tubigrip Size F, 4x10 (in/yd) Discharge Instruction: double layer Apply Tubigrip F 3 finger-widths below knee to base of toes to secure dressing and/or for swelling. Compression Wrap Compression Stockings Add-Ons Electronic Signature(s) Signed: 05/16/2022 4:51:29 PM By: [name] Signed: 05/16/2022 7:35:08 PM By: Gretta Cool, [name], RN, CWS, [name], [name] Entered By: [name] on 05/16/2022 15:54:50 Prouse, Herminio Heads ([id number]) [id number] Page 9 of 9 -------------------------------------------------------------------------------- Vitals Details Patient Name: Date of Service: [name], [name] [name]. 05/16/2022 3:30 PM Medical Record Number: [id number] Patient Account Number: [id number] Date of Birth/Sex: Treating RN: June 25, 1985 (37 y.o. [name], Kim Primary Care Royce Sciara: [name] Other Clinician: Massie [name] Referring Marek Nghiem: Treating Manessa Buley/Extender: [name] in Treatment: 4 Vital Signs Time Taken: 15:43 Temperature (F): 98.2 Height (in): 66 Pulse (bpm): 79 Weight (lbs): 400 Respiratory Rate (breaths/min): 18 Body Mass Index (BMI): 64.6 Blood Pressure (mmHg): 124/78 Reference Range: 80 - 120 mg / dl Electronic Signature(s) Signed: 05/16/2022 4:51:29 PM By: [name] Entered By: [name] on 05/16/2022 15:46:58

## 2022-05-23 ENCOUNTER — Encounter: Payer: BC Managed Care – PPO | Attending: Internal Medicine | Admitting: Internal Medicine

## 2022-05-23 DIAGNOSIS — I87312 Chronic venous hypertension (idiopathic) with ulcer of left lower extremity: Secondary | ICD-10-CM | POA: Diagnosis not present

## 2022-05-23 DIAGNOSIS — I890 Lymphedema, not elsewhere classified: Secondary | ICD-10-CM | POA: Insufficient documentation

## 2022-05-23 DIAGNOSIS — L97822 Non-pressure chronic ulcer of other part of left lower leg with fat layer exposed: Secondary | ICD-10-CM | POA: Diagnosis present

## 2022-05-25 NOTE — Progress Notes (Signed)
ZOWIE, HOFFMEYER ([id number]) [id number] Page 1 of 9 Visit Report for 05/23/2022 Arrival Information Details Patient Name: Date of Service: [name], [name] 05/23/2022 3:30 PM Medical Record Number: [id number] Patient Account Number: [id number] Date of Birth/Sex: Treating RN: [birth year] (37 y.o. [name] Primary Care Artyom Stencel: [name] Other Clinician: [name] Referring Jeriah Skufca: Treating Toa Mia/Extender: [name] in Treatment: 5 Visit Information History Since Last Visit All ordered tests and consults were completed: No Patient Arrived: Ambulatory Added or deleted any medications: No Arrival Time: 15:50 Any new allergies or adverse reactions: No Transfer Assistance: None Had a fall or experienced change in No Patient Identification Verified: Yes activities of daily living that may affect Secondary Verification Process Completed: Yes risk of falls: Patient Requires Transmission-Based Precautions: No Signs or symptoms of abuse/neglect since last visito No Patient Has Alerts: No Hospitalized since last visit: No Implantable device outside of the clinic excluding No cellular tissue based products placed in the center since last visit: Has Dressing in Place as Prescribed: Yes Has Compression in Place as Prescribed: No Pain Present Now: Yes Electronic Signature(s) Signed: 05/25/2022 1:48:57 PM By: [name] Entered By: [name] on 05/23/2022 15:50:50 -------------------------------------------------------------------------------- Clinic Level of Care Assessment Details Patient Name: Date of Service: [name] 05/23/2022 3:30 PM Medical Record Number: [id number] Patient Account Number: [id number] Date of Birth/Sex: Treating RN: [birth year] (37 y.o. [name] Primary Care Cris Talavera: [name] Other Clinician: [name] Referring Jaidyn Kuhl: Treating Toretto Tingler/Extender:  [name] in Treatment: 5 Clinic Level of Care Assessment Items TOOL 1 Quantity Score []$  - 0 Use when EandM and Procedure is performed on INITIAL visit ASSESSMENTS - Nursing Assessment / Reassessment []$  - 0 General Physical Exam (combine w/ comprehensive assessment (listed just below) when performed on new pt. [address]KARLIAH, [name] ([id number]) [id number] Page 2 of 9 []$  - 0 Comprehensive Assessment (HX, ROS, Risk Assessments, Wounds Hx, etc.) ASSESSMENTS - Wound and Skin Assessment / Reassessment []$  - 0 Dermatologic / Skin Assessment (not related to wound area) ASSESSMENTS - Ostomy and/or Continence Assessment and Care []$  - 0 Incontinence Assessment and Management []$  - 0 Ostomy Care Assessment and Management (repouching, etc.) PROCESS - Coordination of Care []$  - 0 Simple Patient / Family Education for ongoing care []$  - 0 Complex (extensive) Patient / Family Education for ongoing care []$  - 0 Staff obtains Programmer, systems, Records, T Results / Process Orders est []$  - 0 Staff telephones HHA, Nursing Homes / Clarify orders / etc []$  - 0 Routine Transfer to another Facility (non-emergent condition) []$  - 0 Routine Hospital Admission (non-emergent condition) []$  - 0 New Admissions / Biomedical engineer / Ordering NPWT Apligraf, etc. , []$  - 0 Emergency Hospital Admission (emergent condition) PROCESS - Special Needs []$  - 0 Pediatric / Minor Patient Management []$  - 0 Isolation Patient Management []$  - 0 Hearing / Language / Visual special needs []$  - 0 Assessment of Community assistance (transportation, D/C planning, etc.) []$  - 0 Additional assistance / Altered mentation []$  - 0 Support Surface(s) Assessment (bed, cushion, seat, etc.) INTERVENTIONS - Miscellaneous []$  - 0 External ear exam []$  - 0 Patient Transfer (multiple staff / Civil Service fast streamer / Similar devices) []$  - 0 Simple Staple / Suture removal (25 or less) []$  -  0 Complex Staple / Suture removal (26 or more) []$  - 0 Hypo/Hyperglycemic Management (do not check if billed separately) []$  - 0 Ankle / Brachial Index (ABI) - do not check  if billed separately Has the patient been seen at the hospital within the last three years: Yes Total Score: 0 Level Of Care: ____ Electronic Signature(s) Signed: 05/25/2022 1:48:57 PM By: [name] Entered By: [name] on 05/23/2022 16:08:05 -------------------------------------------------------------------------------- Encounter Discharge Information Details Patient Name: Date of Service: [name] 05/23/2022 3:30 PM Medical Record Number: [id number] Patient Account Number: [id number] Date of Birth/Sex: Treating RN: 11-25-1985 (37 y.o. [name] Primary Care [name]: [name] Other Clinician: Swara, Mutschler ([id number]) [id number] Page 3 of 9 Referring Apollos Tenbrink: Treating Valery Amedee/Extender: [name] in Treatment: 5 Encounter Discharge Information Items Post Procedure Vitals Discharge Condition: Stable Temperature (F): 98.2 Ambulatory Status: Ambulatory Pulse (bpm): 98 Discharge Destination: Home Respiratory Rate (breaths/min): 18 Transportation: Private Auto Blood Pressure (mmHg): 128/83 Accompanied By: self Schedule Follow-up Appointment: Yes Clinical Summary of Care: Electronic Signature(s) Signed: 05/25/2022 1:48:57 PM By: [name] Entered By: [name] on 05/23/2022 16:18:12 -------------------------------------------------------------------------------- Lower Extremity Assessment Details Patient Name: Date of Service: CADIENCE, DANH MA J. 05/23/2022 3:30 PM Medical Record Number: [id number] Patient Account Number: [id number] Date of Birth/Sex: Treating RN: [birth year] (37 y.o. [name] Primary Care Donold Marotto: [name] Other Clinician: [name] Referring Kyliana Standen: Treating  Josemanuel Eakins/Extender: [name] in Treatment: 5 Edema Assessment Assessed: Shirlyn Goltz: Yes] Patrice Paradise: No] Edema: [Left: Ye] [Right: s] Calf Left: Right: Point of Measurement: 32 cm From Medial Instep 59.7 cm Ankle Left: Right: Point of Measurement: 12 cm From Medial Instep 32.7 cm Vascular Assessment Pulses: Dorsalis Pedis Palpable: [Left:Yes] Electronic Signature(s) Signed: 05/25/2022 1:48:57 PM By: [name] Signed: 05/25/2022 1:49:05 PM By: Gretta Cool, [name], RN, CWS, [name], [name] Entered By: [name] on 05/23/2022 16:01:29 [name] ([id number] Page 4 of 9 -------------------------------------------------------------------------------- Multi Wound Chart Details Patient Name: Date of Service: MICHIYE, LISBOA [place]. 05/23/2022 3:30 PM Medical Record Number: [id number] Patient Account Number: [id number] Date of Birth/Sex: Treating RN: February 16, 1986 (37 y.o. [name] Primary Care Alec Jaros: [name] Other Clinician: [name] Referring Latoyna [name]: Treating Kalayla Shadden/Extender: [name] in Treatment: 5 Vital Signs Height(in): 80 Pulse(bpm): 41 Weight(lbs): 400 Blood Pressure(mmHg): 128/83 Body Mass Index(BMI): 64.6 Temperature(F): 98.2 Respiratory Rate(breaths/min): 18 [5:Photos:] [N/A:N/A] Left, Medial Lower Leg N/A N/A Wound Location: Gradually Appeared N/A N/A Wounding Event: Venous Leg Ulcer N/A N/A Primary Etiology: Hypertension, Peripheral Venous N/A N/A Comorbid History: Disease 04/10/2022 N/A N/A Date Acquired: 5 N/A N/A Weeks of Treatment: Open N/A N/A Wound Status: No N/A N/A Wound Recurrence: 2.2x3.4x0.2 N/A N/A Measurements L x W x D (cm) 5.875 N/A N/A A (cm) : rea 1.175 N/A N/A Volume (cm) : -6.90% N/A N/A % Reduction in Area: -6.80% N/A N/A % Reduction in Volume: Full Thickness Without Exposed N/A N/A Classification: Support  Structures Medium N/A N/A Exudate A mount: Serosanguineous N/A N/A Exudate Type: red, brown N/A N/A Exudate Color: None Present (0%) N/A N/A Granulation Amount: Large (67-100%) N/A N/A Necrotic Amount: Eschar, Adherent Slough N/A N/A Necrotic Tissue: Fat Layer (Subcutaneous Tissue): Yes N/A N/A Exposed Structures: Fascia: No Tendon: No Muscle: No Joint: No Bone: No None N/A N/A Epithelialization: Treatment Notes Electronic Signature(s) Signed: 05/25/2022 1:48:57 PM By: [name] Entered By: [name] on 05/23/2022 [place], Herminio Heads ([id number] Page 5 of 9 -------------------------------------------------------------------------------- Multi-Disciplinary Care Plan Details Patient Name: Date of Service: KELAH, SPROUSE [place]. 05/23/2022 3:30 PM Medical Record Number: [id number] Patient Account Number: [id number] Date of Birth/Sex: Treating RN: 1986-01-30 (  37 y.o. Charolette Forward, Kim Primary Care Tena Linebaugh: [name] Other Clinician: [name] Referring Lian Tanori: Treating Seiji Wiswell/Extender: [name] in Treatment: 5 Active Inactive Necrotic Tissue Nursing Diagnoses: Knowledge deficit related to management of necrotic/devitalized tissue Goals: Patient/caregiver will verbalize understanding of reason and process for debridement of necrotic tissue Date Initiated: 04/18/2022 Target Resolution Date: 05/19/2022 Goal Status: Active Interventions: Assess patient pain level pre-, during and post procedure and prior to discharge Provide education on necrotic tissue and debridement process Notes: Venous Leg Ulcer Nursing Diagnoses: Knowledge deficit related to disease process and management Goals: Patient will maintain optimal edema control Date Initiated: 04/18/2022 Target Resolution Date: 05/19/2022 Goal Status: Active Interventions: Assess peripheral edema status every visit. Compression as  ordered Notes: Wound/Skin Impairment Nursing Diagnoses: Knowledge deficit related to ulceration/compromised skin integrity Goals: Patient/caregiver will verbalize understanding of skin care regimen Date Initiated: 04/18/2022 Target Resolution Date: 05/19/2022 Goal Status: Active Ulcer/skin breakdown will have a volume reduction of 30% by week 4 Date Initiated: 04/18/2022 Target Resolution Date: 05/19/2022 Goal Status: Active Ulcer/skin breakdown will have a volume reduction of 50% by week 8 Date Initiated: 04/18/2022 Target Resolution Date: 06/18/2022 Goal Status: Active Ulcer/skin breakdown will have a volume reduction of 80% by week 12 Date Initiated: 04/18/2022 Target Resolution Date: 07/18/2022 Goal Status: Active Ulcer/skin breakdown will heal within 14 weeks Date Initiated: 04/18/2022 Target Resolution Date: 08/17/2022 OVIE, ROMANOS ([id number]) [phone].pdf Page 6 of 9 Goal Status: Active Interventions: Assess patient/caregiver ability to obtain necessary supplies Assess patient/caregiver ability to perform ulcer/skin care regimen upon admission and as needed Assess ulceration(s) every visit Notes: Electronic Signature(s) Signed: 05/25/2022 1:48:57 PM By: [name] Signed: 05/25/2022 1:49:05 PM By: Gretta Cool, [name], RN, CWS, [name], [name] Entered By: [name] on 05/23/2022 16:17:19 -------------------------------------------------------------------------------- Pain Assessment Details Patient Name: Date of Service: [place], [place] 05/23/2022 3:30 PM Medical Record Number: [id number] Patient Account Number: [id number] Date of Birth/Sex: Treating RN: 1985/05/26 (37 y.o. [name] Primary Care Raymund Manrique: [name] Other Clinician: [name] Referring Debbera Wolken: Treating Haven Pylant/Extender: [name] in Treatment: 5 Active Problems Location of Pain Severity and Description of Pain Patient Has Paino Yes Site  Locations Pain Location: Pain in Ulcers Duration of the Pain. Constant / Intermittento Constant Rate the pain. Current Pain Level: 6 Character of Pain Describe the Pain: Throbbing Pain Management and Medication Current Pain Management: Medication: Yes Cold Application: No Rest: No Massage: No Activity: No T.E.N.S.: No Heat Application: No Leg drop or elevation: No Is the Current Pain Management Adequate: Inadequate How does your wound impact your activities of daily livingo Sleep: No Bathing: No Appetite: No Relationship With Others: No Bladder Continence: No Emotions: No BRENLYN, BAXENDALE ([id number]) [phone].pdf Page 7 of 9 Bowel Continence: No Work: No Toileting: No Drive: No Dressing: No Hobbies: No Electronic Signature(s) Signed: 05/25/2022 1:48:57 PM By: [name] Signed: 05/25/2022 1:49:05 PM By: Gretta Cool, [name], RN, CWS, [name], [name] Entered By: [name] on 05/23/2022 15:53:36 -------------------------------------------------------------------------------- Patient/Caregiver Education Details Patient Name: Date of Service: Valora Corporal MA Lenna Sciara 2/7/2024andnbsp3:30 PM Medical Record Number: [id number] Patient Account Number: [id number] Date of Birth/Gender: Treating RN: 1986-01-23 (37 y.o. [name] Primary Care Physician: [name] Other Clinician: [name] Referring Physician: Treating Physician/Extender: [name] in Treatment: 5 Education Assessment Education Provided To: Patient Education Topics Provided Wound/Skin Impairment: Handouts: Other: continue wound care as directed Methods: Explain/Verbal Responses: State content correctly Electronic Signature(s) Signed: 05/25/2022 1:48:57 PM  By: [name] Entered By: [name] on 05/23/2022 16:17:14 -------------------------------------------------------------------------------- Wound Assessment Details Patient Name: Date  of Service: KEGAN, PIERROT MA J. 05/23/2022 3:30 PM Medical Record Number: [id number] Patient Account Number: [id number] Date of Birth/Sex: Treating RN: 03-02-1986 (37 y.o. [name] Primary Care Doroteo Nickolson: [name] Other Clinician: [name] Referring Ianmichael Amescua: Treating Antonela Freiman/Extender: [name] in Treatment: 5 Wound Status Wound Number: 5 Primary Etiology: Venous Leg Ulcer MAISY, SPRAGGINS ([id number]) [phone].pdf Page 8 of 9 Wound Location: Left, Medial Lower Leg Wound Status: Open Wounding Event: Gradually Appeared Comorbid History: Hypertension, Peripheral Venous Disease Date Acquired: 04/10/2022 Weeks Of Treatment: 5 Clustered Wound: No Photos Wound Measurements Length: (cm) 2.2 Width: (cm) 3.4 Depth: (cm) 0.2 Area: (cm) 5.875 Volume: (cm) 1.175 % Reduction in Area: -6.9% % Reduction in Volume: -6.8% Epithelialization: None Wound Description Classification: Full Thickness Without Exposed Support Structures Exudate Amount: Medium Exudate Type: Serosanguineous Exudate Color: red, brown Foul Odor After Cleansing: No Slough/Fibrino Yes Wound Bed Granulation Amount: None Present (0%) Exposed Structure Necrotic Amount: Large (67-100%) Fascia Exposed: No Necrotic Quality: Eschar, Adherent Slough Fat Layer (Subcutaneous Tissue) Exposed: Yes Tendon Exposed: No Muscle Exposed: No Joint Exposed: No Bone Exposed: No Treatment Notes Wound #5 (Lower Leg) Wound Laterality: Left, Medial Cleanser Byram Ancillary Kit - 15 Day Supply Discharge Instruction: Use supplies as instructed; Kit contains: (15) Saline Bullets; (15) 3x3 Gauze; 15 pr Gloves Peri-Wound Care Topical Primary Dressing keystone Secondary Dressing Hydrofera Blue Ready Transfer Foam, 2.5x2.5 (in/in) Discharge Instruction: Apply to wound bed over non-stick dressing. (BORDER) Zetuvit Plus SILICONE BORDER Dressing 5x5 (in/in) Discharge  Instruction: Please do not put silicone bordered dressings under wraps. Use non-bordered dressing only. Secured With Tubigrip Size F, 4x10 (in/yd) Discharge Instruction: double layer Apply Tubigrip F 3 finger-widths below knee to base of toes to secure dressing and/or for swelling. Compression Wrap Compression Stockings Add-Ons Electronic Signature(s) Signed: 05/25/2022 1:48:57 PM By: Nydia Bouton, Herminio Heads ([id number]) [id number] Page 9 of 9 Signed: 05/25/2022 1:49:05 PM By: Gretta Cool, [name], RN, CWS, [name], [name] Entered By: [name] on 05/23/2022 15:59:07 -------------------------------------------------------------------------------- Vitals Details Patient Name: Date of Service: [place], [place]. 05/23/2022 3:30 PM Medical Record Number: [id number] Patient Account Number: [id number] Date of Birth/Sex: Treating RN: July 20, 1985 (37 y.o. Charolette Forward, Kim Primary Care Sharri Loya: [name] Other Clinician: [name] Referring Ramisa Duman: Treating Sravya Grissom/Extender: [name] in Treatment: 5 Vital Signs Time Taken: 15:51 Temperature (F): 98.2 Height (in): 66 Pulse (bpm): 98 Weight (lbs): 400 Respiratory Rate (breaths/min): 18 Body Mass Index (BMI): 64.6 Blood Pressure (mmHg): 128/83 Reference Range: 80 - 120 mg / dl Electronic Signature(s) Signed: 05/25/2022 1:48:57 PM By: [name] Entered By: [name] on 05/23/2022 15:53:32

## 2022-05-25 NOTE — Progress Notes (Signed)
[name], [name] ([id number]) [id number] Page 1 of 10 Visit Report for 05/23/2022 Chief Complaint Document Details Patient Name: Date of Service: [name], [name] [place]. 05/23/2022 3:30 PM Medical Record Number: [id number] Patient Account Number: [id number] Date of Birth/Sex: Treating RN: November 29, 1985 (37 y.o. [name] Primary Care Provider: [name] Other Clinician: [name] Referring Provider: Treating Provider/Extender: [name] in Treatment: 5 Information Obtained from: Patient Chief Complaint 04/18/2022; Left LE Ulcer Electronic Signature(s) Signed: 05/23/2022 4:08:55 PM By: Kalman Shan DO Entered By: Kalman Shan on 05/23/2022 16:06:14 -------------------------------------------------------------------------------- Debridement Details Patient Name: Date of Service: [name] [name] [place] 05/23/2022 3:30 PM Medical Record Number: [id number] Patient Account Number: [id number] Date of Birth/Sex: Treating RN: July 13, 1985 (37 y.o. Charolette Forward, Kim Primary Care Provider: [name] Other Clinician: [name] Referring Provider: Treating Provider/Extender: [name] in Treatment: 5 Debridement Performed for Assessment: Wound #5 Left,Medial Lower Leg Performed By: Physician Kalman Shan, MD Debridement Type: Debridement Severity of Tissue Pre Debridement: Fat layer exposed Level of Consciousness (Pre-procedure): Awake and Alert Pre-procedure Verification/Time Out Yes - 16:04 Taken: Start Time: 16:04 T Area Debrided (L x W): otal 2.2 (cm) x 3.4 (cm) = 7.48 (cm) Tissue and other material debrided: Viable, Non-Viable, Slough, Subcutaneous, Slough Level: Skin/Subcutaneous Tissue Debridement Description: Excisional Instrument: Curette Bleeding: Minimum Hemostasis Achieved: Pressure Response to Treatment: Procedure was tolerated well Level of Consciousness (Post- Awake and  Alert procedure): [name] [name] ([id number]) [id number] Page 2 of 10 Post Debridement Measurements of Total Wound Length: (cm) 2.2 Width: (cm) 3.4 Depth: (cm) 0.1 Volume: (cm) 0.587 Character of Wound/Ulcer Post Debridement: Stable Severity of Tissue Post Debridement: Fat layer exposed Post Procedure Diagnosis Same as Pre-procedure Electronic Signature(s) Signed: 05/23/2022 4:08:55 PM By: Kalman Shan DO Signed: 05/25/2022 1:48:57 PM By: [name] Signed: 05/25/2022 1:49:05 PM By: Gretta Cool, [name], RN, CWS, [name], [name] Entered By: [name] on 05/23/2022 16:05:16 -------------------------------------------------------------------------------- HPI Details Patient Name: Date of Service: [name] [name]. 05/23/2022 3:30 PM Medical Record Number: [id number] Patient Account Number: [id number] Date of Birth/Sex: Treating RN: [birth year] (37 y.o. [name] Primary Care Provider: [name] Other Clinician: [name] Referring Provider: Treating Provider/Extender: [name] in Treatment: 5 History of Present Illness HPI Description: 37 year old patient who started with having ulcerations on the right lower leg on the lateral part of her ankle for about 2 weeks. She was seen in the ER at [facility] and advised to see the wound care for a consultation. No X-rays of workup was done during the ER visit and no prescription for any medications of compression wraps were given. the patient is not diabetic but does have hypertension and her medications have been reviewed by me. 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 The right and left greater saphenous vein was found to be tortuous. Deep venous system was also not competent and there was reflux of more  than 500 ms. She was then seen by Dr. Darene Lamer Early who recommended that the patient would not benefit from endovenous ablation and he had recommended vein stripping odd on the right side and multiple small phlebectomy procedures on the left side. the patient did not follow-up due to social economic reasons. She has not been wearing any compression stockings and has not taken any  specific treatment for varicose veins for the last 3 years. 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 We have obtained some appointments to see the vascular surgeons by the end of the month and the patient would like to follow up with me at my [facility] on Wednesday, June 29. 10/14/2014 -- she could not see me yesterday in [place] and hence has come for a review today. She has a vascular workup to be done this afternoon at [facility]. She is doing fine otherwise. 10/22/2014 -- she was seen by Dr. Althea Charon and he has recommended surgical removal of her right saphenous vein from distal thigh to saphenofemoral odd junction and stab phlebectomy's of multiple large tributary branches throughout her thigh and calf. This would be done under general anesthesia in the outpatient setting. 10/29/2014 -- she is trying to work on a surgical date and in the meanwhile we have got insurance clearance for Apligraf and we will start this next week. 7/22 2016 -- she is here for the first application of Apligraf. 11/19/2014 -- she is here for a second application of Apligraf 11/26/2014 -- she has done fine after her last application of Apligraf and is awaiting her surgery which is scheduled for August 31. 12/03/2014 -- she is doing fine and is here for her third application of Apligraf. 12/21/2014 -- She had surgery on 12/15/2014 by Dr.Early who did #1 ligation and stripping of right great saphenous vein from distal thigh to  saphenofemoral junction, #2 stab phlebectomy of large tributary varicose veins in the thigh popliteal space and calf. She had an Ace wrap up to her groin and this was removed today and the Unna's boot was also removed. 12/28/2014 -- she is here for her fourth application of Apligraf. 01/06/2015 - he saw her vascular surgeon Dr. Donnetta Hutching who was pleased with her progress and he has confirmed that no surgical procedures could be attempted on the left side. 01/13/2015 -- her wound looks very good and she's been having no problems whatsoever. Readmission: [name], [name] ([id number]) [id number] Page 3 of 10 07/26/2020 upon evaluation today patient presents for initial inspection here in our clinic for a new issue with her left leg although she is previously been seen due to issues with the right leg back in 2016. At that time she was seeing Dr. Donnetta Hutching who is a vein/vascular specialist in [place]. He has since semiretired from what I understand. He is working out of [employer] I believe. Nonetheless she tells me at the time that there was really nothing to do for her left leg although the right leg was where they did most of the work. Subsequently she states that she is done fairly well until just in the past week where she had issues with bleeding from what appears to be varicose vein on the left leg medially. Unfortunately this has continued to be an issue although she tells me at first it was coming much more significantly Down quite a bit but nonetheless has not completely resolved. Every time she showers she notices that it starts to drain a little bit more. She does have a history of chronic venous insufficiency, lymphedema, varicose veins bilaterally, and obesity. 08/02/2020 upon evaluation today patient appears to be doing about the same in regards to the ulcer on her left leg. She has some eschar covering there is definitely some fluid collecting underneath  unfortunately. With  that being said she tells me she is still having a tremendous amount of pain therefore she is really not able to allow me to clean this off very effectively to be perfectly honest. I think we need to try to soften this up 08/16/2020 upon evaluation today patient's wound is really not doing significantly better not really states about the same. She notes that the wrap just does not seem to be staying up very well at all unfortunately. No fevers, chills, nausea, vomiting, or diarrhea. She did cut it off once it starts to slide in order to alleviate some of the pressure from sliding Down. Fortunately there is no signs of active infection at this time which is great news. 08/23/2020 upon evaluation today patient appears to be doing well 08/23/2020 upon evaluation today patient appears to be doing well with regard to her wound all things considered. Fortunately there does not appear to be any signs of active infection at this time which is great news. She has been tolerating the dressing changes without complication and overall I am extremely pleased with where things stand at this point. She does have her appointment with vascular in [facility] on June 9. 08/30/2020 upon evaluation today patient actually appears to be doing decently well in regard to her wound. Fortunately there is no signs of active infection which is great news. Nonetheless I do believe that the patient is going require little bit of debridement if she is okay with me attempting that today I think that will help clean off some of the necrotic tissue. Fortunately there does not appear to be otherwise any evidence of active infection which is also great news. 09/19/2020 upon evaluation today patient appears to be doing a little better in regard to her wound as compared to previous. Fortunately there does not appear to be any signs of active infection overall. No fever chills noted. I do believe that the Iodosorb has made this a  little bit better with regard to the overall size and appearance of the wound bed though again she does still have quite a ways to go to get this to heal she still very tender to touch. 09/27/2020 upon evaluation today patient appears to actually be doing quite well with regard to her wound. This is measuring much smaller which is great news. With that being said she did see vein and vascular in [facility] and they subsequently recommended that surgery is really what she probably needs to go forward with sounds like the potential for venous ablation. With that being said the patient tells me this is just not the right time for her to be able to proceed with any type of surgery which I completely understand. Nonetheless I do believe that she would continue to benefit from compression but again that is really not something that she is able to easily do. 10/04/2020 upon evaluation today patient appears to be doing about the same in regard to her wound. This is measuring a little bit smaller but nonetheless still is open and again has some slough and biofilm noted on the surface of the wound. There does not appear to be any signs of active infection which is great news. No fevers, chills, nausea, vomiting, or diarrhea. 10/04/2020 upon evaluation today patient appears to be doing well with regard to her wound. She has been tolerating dressing changes without complication. Fortunately there does not appear to be any signs of active infection which is great news. No fevers, chills, nausea, vomiting, or  diarrhea. 10/25/2020 upon evaluation today patient appears to be doing well with regard to her wound. She has been tolerating the dressing changes without complication. Fortunately there is no signs of active infection at this time. No fevers, chills, nausea, vomiting, or diarrhea. 11/01/2020 upon evaluation today patient with regard to her wound. She has been tolerating the dressing changes without complication.  Fortunately there does not appear to be any signs of infection which is great news. No fever chills noted 11/15/2020 upon evaluation today patient appears to be doing well with regard to her wound. Fortunately there is no signs of active infection at this time. No fevers, chills, nausea, vomiting, or diarrhea. With that being said she continues to have a significant amount of pain at the site even though this is very close to complete closure. She also had several varicose veins around the area which were also problematic. Overall however I feel like the patient is making excellent progress. 11/28/2020 upon evaluation today patient appears to be doing well with regard to her leg ulcer. Again were not really able to debride or compression wrap her due to discomfort and pain. She does not allow for that. With that being said we have been using Iodosorb which does seem to be doing decently well. Fortunately there is no signs of active infection at this time which is great news. No fevers, chills, nausea, vomiting, or diarrhea. 8/31; patient presents for 2-week follow-up. She has been using Iodosorb. She reports that the wound is closed. She denies signs of infection. Readmission: 10-27-2021 upon evaluation this is a patient that presents today whom I have previously seen this is pretty much for the same issue though I think a little bit higher than the last time I saw her. She does have a history of chronic venous insufficiency and hypertension along with varicose veins. Subsequently she does have an ulceration which spontaneously ruptured she has not been wearing any compression which I think is a big part of the issue here. We discussed this before I really think she probably needs to be wearing her compression therapy, she probably needs lymphedema pumps if she can ever wear the compression for a significant amount of time to get these, and subsequently also think that she needs to be elevating her legs  is much as possible she may even need some vascular intervention in regard to her veins. All of this was reiterated and discussed with her today to reinforce what needs to happen in order to ensure that her legs do not get a lot worse. The patient voiced understanding. She tells me that she knows because she is seeing her mom go through a lot of this as well how bad things can get. 11-03-2021 upon evaluation today patient appears to be doing well with regard to her wound. Fortunately there does not appear to be any signs of active infection at this time. She is measuring a little bit bigger but I think this is because the wound is actually cleaning up a bit here. 7/27; left lateral leg wound not any smaller but perhaps with a cleaner surface. She is using Iodoflex to help with the latter and using Tubigrip. She has chronic venous insufficiency with secondary lymphedema. She is apparently followed by vein and vascular and is being scheduled for an ablation 11-17-2021 upon evaluation today patient appears to be doing well with regard to her wound this is actually showing signs of improvement which is great news. Fortunately I do not see  any evidence of active infection locally or systemically at this time which is great news. No fevers, chills, nausea, vomiting, or diarrhea. 11-24-2021 upon evaluation today patient's wound is actually showing signs of significant improvement. Unfortunately she had quite a bit of pain with debridement last week I do believe it was beneficial but at the same time she is doing much better but still really does not want this debrided again I think being that it is looking a whole lot better I would try to avoid that today especially since it caused her so much discomfort that is really not the goal and I explained that to the patient today she voiced understanding and knows that it needed to be done but still states that it was quite painful. 11-30-2021 upon evaluation today  patient appears to be doing excellent in regard to her wound this is actually showing signs of excellent improvement I am very pleased with where things stand. She does have her venous ablation appointment for October 17. 12-21-2021 upon evaluation today patient appears to be doing better in regard to her wound this is measuring smaller and looking better as well. Fortunately I do not see any signs of active infection locally or systemically which is great news. [name], [name] ([id number]) [id number] Page 4 of 10 01-01-2022 upon evaluation today patient appears to be doing well currently in regard to her wound. She is showing signs of improvement which is great news and overall I do not see any signs of active infection locally or systemically at this time. 01-08-2022 upon evaluation today patient appears to be doing well currently in regard to her wound she is actually showing signs of significant improvement which is great news. Fortunately I do not see any evidence of active infection locally or systemically at this time. I do believe that we are on the right track. She also has her appointment October 19 for the venous ablation. 01-16-2022 upon evaluation today patient's wound actually is showing some signs of improvement although this is very slow. Fortunately I do not see any evidence of infection at this time. The volume is a little bit more although the size is smaller I think this is due to the fact that we are slowly cleaning this area out effectively. 11/6 continued improvement the patient is using Iodoflex and a Tubigrip E. She was supposed to have venous surgery at vein and vascular in [place] however somehow this is gotten delayed till December 14. 02-27-2022 upon evaluation today patient's wound is actually showing signs of being completely healed. Fortunately I do not see any evidence of active infection locally or systemically which is great news and  overall I am extremely pleased with where we stand today. Unfortunately she does tell me that she postpone her venous ablation surgery until December 14 she tells me that she was not ready "financially" for this. 04/18/2022; Ms. Trisa Byrley is a 37 year old female with a past medical history of venous insufficiency that presents the clinic for a left lower extremity wound. She was seen almost 2 months ago for the same wound. This had healed with Iodoflex and Tubigrip. She states that the wound recently reopened. She has seen vein and vascular and plan is for ligation and stripping of the left great saphenous vein. She is not sure when this procedure is going to be scheduled. She has canceled it once before. She has had office compression wraps in the past however these do not stay on and create more of  an issue for her. She would like to avoid this. She currently denies signs of infection. 1/10; patient presents for follow-up. She has been using Iodoflex to the wound bed. She states she has been using her Tubigrip however she does not have this on today. She has no issues or complaints today. She denies signs of infection. 1/17; patient presents for follow-up. She has not been using Iodoflex to the wound bed. She reports acute pain. She denies increased warmth, erythema or purulent drainage to the left lower extremity. She states she has been using Tubigrip. She has information to order the compression stockings but has not obtained them. 1/24; Patient had a wound culture done at last clinic visit that grew Proteus mirabilis and E. coli. She was prescribed levofloxacin and this should cover both bacteria. She has been taking the medication over the past week with improvement of symptoms to the wound bed. She has been using Hydrofera Blue under Tubigrip. She states the [facility] is sticking to her wound bed. 1/31; patient presents for follow-up. She has been using Medihoney to the wound bed with  improvement in healing. She has been contacted by [facility] to order her antibiotic ointment. She is not sure yet if she is able to afford this. She received her compression stockings but states they did not fit well. She is working on returning these. She has been using Tubigrip. 2/7; patient presents for follow-up. She is been using Medihoney to the wound bed. She obtained her compression stockings and has been using these daily. She states that the [facility] antibiotic ointment is arriving in the mail tomorrow. She has no issues or complaints today. She denies systemic signs of infection. Electronic Signature(s) Signed: 05/23/2022 4:08:55 PM By: Kalman Shan DO Entered By: Kalman Shan on 05/23/2022 16:06:45 -------------------------------------------------------------------------------- Physical Exam Details Patient Name: Date of Service: [place], [place] 05/23/2022 3:30 PM Medical Record Number: [id number] Patient Account Number: [id number] Date of Birth/Sex: Treating RN: 07-Jan-1986 (37 y.o. [name] Primary Care Provider: [name] Other Clinician: [name] Referring Provider: Treating Provider/Extender: [name] in Treatment: 5 Constitutional . Cardiovascular . Psychiatric . Notes Left lower extremity: T the distal medial aspect there is an open wound with granulation tissue and nonviable tissue. No increased warmth, erythema or o purulent drainage. [name], [name] ([id number]) [id number] Page 5 of 10 Electronic Signature(s) Signed: 05/23/2022 4:08:55 PM By: Kalman Shan DO Entered By: Kalman Shan on 05/23/2022 16:07:09 -------------------------------------------------------------------------------- Physician Orders Details Patient Name: Date of Service: [place], [place] 05/23/2022 3:30 PM Medical Record Number: [id number] Patient Account Number: [id number] Date of Birth/Sex: Treating  RN: 03-Aug-1985 (37 y.o. [name] Primary Care Provider: [name] Other Clinician: [name] Referring Provider: Treating Provider/Extender: [name] in Treatment: 5 Verbal / Phone Orders: No Diagnosis Coding Follow-up Appointments Return Appointment in 1 week. Bathing/ Shower/ Hygiene May shower; gently cleanse wound with antibacterial soap, rinse and pat dry prior to dressing wounds Anesthetic (Use 'Patient Medications' Section for Anesthetic Order Entry) Lidocaine applied to wound bed Edema Control - Lymphedema / Segmental Compressive Device / Other Elevate, Exercise Daily and A void Standing for Long Periods of Time. Elevate legs to the level of the heart and pump ankles as often as possible Elevate leg(s) parallel to the floor when sitting. Medications-Please add to medication list. Keystone Compound - keystone gel Wound Treatment Wound #5 - Lower Leg Wound Laterality: Left, Medial Cleanser: Byram Ancillary Kit - 15  Day Supply (Generic) 1 x Per Day/30 Days Discharge Instructions: Use supplies as instructed; Kit contains: (15) Saline Bullets; (15) 3x3 Gauze; 15 pr Gloves Prim Dressing: keystone ary 1 x Per Day/30 Days Secondary Dressing: Hydrofera Blue Ready Transfer Foam, 2.5x2.5 (in/in) 1 x Per Day/30 Days Discharge Instructions: Apply to wound bed over non-stick dressing. Secondary Dressing: (BORDER) Zetuvit Plus SILICONE BORDER Dressing 5x5 (in/in) 1 x Per Day/30 Days Discharge Instructions: Please do not put silicone bordered dressings under wraps. Use non-bordered dressing only. Secured With: Tubigrip Size F, 4x10 (in/yd) (Generic) 1 x Per Day/30 Days Discharge Instructions: double layer Apply Tubigrip F 3 finger-widths below knee to base of toes to secure dressing and/or for swelling. Electronic Signature(s) Signed: 05/23/2022 4:08:55 PM By: Kalman Shan DO Entered By: Kalman Shan on 05/23/2022 16:08:35 [name],  [name] Heads ([id number] Page 6 of 10 -------------------------------------------------------------------------------- Problem List Details Patient Name: Date of Service: CHARRISE, LEMAN [place]. 05/23/2022 3:30 PM Medical Record Number: [id number] Patient Account Number: [id number] Date of Birth/Sex: Treating RN: Jun 21, 1985 (37 y.o. Charolette Forward, Kim Primary Care Provider: [name] Other Clinician: [name] Referring Provider: Treating Provider/Extender: [name] in Treatment: 5 Active Problems ICD-10 Encounter Code Description Active Date MDM Diagnosis I87.312 Chronic venous hypertension (idiopathic) with ulcer of left lower extremity 04/18/2022 No Yes L97.822 Non-pressure chronic ulcer of other part of left lower leg with fat layer exposed1/06/2022 No Yes I89.0 Lymphedema, not elsewhere classified 04/18/2022 No Yes Inactive Problems Resolved Problems Electronic Signature(s) Signed: 05/23/2022 4:08:55 PM By: Kalman Shan DO Entered By: Kalman Shan on 05/23/2022 16:06:11 -------------------------------------------------------------------------------- Progress Note Details Patient Name: Date of Service: [place], [place] 05/23/2022 3:30 PM Medical Record Number: [id number] Patient Account Number: [id number] Date of Birth/Sex: Treating RN: 03/26/1986 (37 y.o. Charolette Forward, Kim Primary Care Provider: [name] Other Clinician: [name] Referring Provider: Treating Provider/Extender: [name] in Treatment: 5 Subjective Chief Complaint Information obtained from Patient [name], [name] ([id number]) [id number] Page 7 of 10 04/18/2022; Left LE Ulcer History of Present Illness (HPI) 37 year old patient who started with having ulcerations on the right lower leg on the lateral part of her ankle for about 2 weeks. She was seen in the ER at [facility]  and advised to see the wound care for a consultation. No X-rays of workup was done during the ER visit and no prescription for any medications of compression wraps were given. the patient is not diabetic but does have hypertension and her medications have been reviewed by me. 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 The right and left greater saphenous vein was found to be tortuous. Deep venous system was also not competent and there was reflux of more than 500 ms. She was then seen by Dr. Darene Lamer Early who recommended that the patient would not benefit from endovenous ablation and he had recommended vein stripping odd on the right side and multiple small phlebectomy procedures on the left side. the patient did not follow-up due to social economic reasons. She has not been wearing any compression stockings and has not taken any specific treatment for varicose veins for the last 3 years. 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 We have  obtained some appointments to see the vascular surgeons by the end of the month and the patient would like to follow up with me at my [facility] on Wednesday, June 29. 10/14/2014 -- she could not see me yesterday in [place] and hence has come for a review today. She has a vascular workup to be done this afternoon at [facility]. She is doing fine otherwise. 10/22/2014 -- she was seen by Dr. Althea Charon and he has recommended surgical removal of her right saphenous vein from distal thigh to saphenofemoral odd junction and stab phlebectomy's of multiple large tributary branches throughout her thigh and calf. This would be done under general anesthesia in the outpatient setting. 10/29/2014 -- she is  trying to work on a surgical date and in the meanwhile we have got insurance clearance for Apligraf and we will start this next week. 7/22 2016 -- she is here for the first application of Apligraf. 11/19/2014 -- she is here for a second application of Apligraf 11/26/2014 -- she has done fine after her last application of Apligraf and is awaiting her surgery which is scheduled for August 31. 12/03/2014 -- she is doing fine and is here for her third application of Apligraf. 12/21/2014 -- She had surgery on 12/15/2014 by Dr.Early who did #1 ligation and stripping of right great saphenous vein from distal thigh to saphenofemoral junction, #2 stab phlebectomy of large tributary varicose veins in the thigh popliteal space and calf. She had an Ace wrap up to her groin and this was removed today and the Unna's boot was also removed. 12/28/2014 -- she is here for her fourth application of Apligraf. 01/06/2015 - he saw her vascular surgeon Dr. Donnetta Hutching who was pleased with her progress and he has confirmed that no surgical procedures could be attempted on the left side. 01/13/2015 -- her wound looks very good and she's been having no problems whatsoever. Readmission: 07/26/2020 upon evaluation today patient presents for initial inspection here in our clinic for a new issue with her left leg although she is previously been seen due to issues with the right leg back in 2016. At that time she was seeing Dr. Donnetta Hutching who is a vein/vascular specialist in [place]. He has since semiretired from what I understand. He is working out of [employer] I believe. Nonetheless she tells me at the time that there was really nothing to do for her left leg although the right leg was where they did most of the work. Subsequently she states that she is done fairly well until just in the past week where she had issues with bleeding from what appears to be varicose vein on the left leg medially. Unfortunately this has continued to be an  issue although she tells me at first it was coming much more significantly Down quite a bit but nonetheless has not completely resolved. Every time she showers she notices that it starts to drain a little bit more. She does have a history of chronic venous insufficiency, lymphedema, varicose veins bilaterally, and obesity. 08/02/2020 upon evaluation today patient appears to be doing about the same in regards to the ulcer on her left leg. She has some eschar covering there is definitely some fluid collecting underneath unfortunately. With that being said she tells me she is still having a tremendous amount of pain therefore she is really not able to allow me to clean this off very effectively to be perfectly honest. I think we need to try to soften this up 08/16/2020 upon  evaluation today patient's wound is really not doing significantly better not really states about the same. She notes that the wrap just does not seem to be staying up very well at all unfortunately. No fevers, chills, nausea, vomiting, or diarrhea. She did cut it off once it starts to slide in order to alleviate some of the pressure from sliding Down. Fortunately there is no signs of active infection at this time which is great news. 08/23/2020 upon evaluation today patient appears to be doing well 08/23/2020 upon evaluation today patient appears to be doing well with regard to her wound all things considered. Fortunately there does not appear to be any signs of active infection at this time which is great news. She has been tolerating the dressing changes without complication and overall I am extremely pleased with where things stand at this point. She does have her appointment with vascular in [facility] on June 9. 08/30/2020 upon evaluation today patient actually appears to be doing decently well in regard to her wound. Fortunately there is no signs of active infection which is great news. Nonetheless I do believe that the patient is  going require little bit of debridement if she is okay with me attempting that today I think that will help clean off some of the necrotic tissue. Fortunately there does not appear to be otherwise any evidence of active infection which is also great news. 09/19/2020 upon evaluation today patient appears to be doing a little better in regard to her wound as compared to previous. Fortunately there does not appear to be any signs of active infection overall. No fever chills noted. I do believe that the Iodosorb has made this a little bit better with regard to the overall size and appearance of the wound bed though again she does still have quite a ways to go to get this to heal she still very tender to touch. 09/27/2020 upon evaluation today patient appears to actually be doing quite well with regard to her wound. This is measuring much smaller which is great news. With that being said she did see vein and vascular in [facility] and they subsequently recommended that surgery is really what she probably needs to go forward with sounds like the potential for venous ablation. With that being said the patient tells me this is just not the right time for her to be able to proceed with any type of surgery which I completely understand. Nonetheless I do believe that she would continue to benefit from compression but again that is really not something that she is able to easily do. 10/04/2020 upon evaluation today patient appears to be doing about the same in regard to her wound. This is measuring a little bit smaller but nonetheless still is open and again has some slough and biofilm noted on the surface of the wound. There does not appear to be any signs of active infection which is great news. No fevers, chills, nausea, vomiting, or diarrhea. 10/04/2020 upon evaluation today patient appears to be doing well with regard to her wound. She has been tolerating dressing changes without complication. Fortunately there  does not appear to be any signs of active infection which is great news. No fevers, chills, nausea, vomiting, or diarrhea. 10/25/2020 upon evaluation today patient appears to be doing well with regard to her wound. She has been tolerating the dressing changes without complication. Fortunately there is no signs of active infection at this time. No fevers, chills, nausea, vomiting, or diarrhea. 11/01/2020 upon  evaluation today patient with regard to her wound. She has been tolerating the dressing changes without complication. Fortunately there does not appear to be any signs of infection which is great news. No fever chills noted 11/15/2020 upon evaluation today patient appears to be doing well with regard to her wound. Fortunately there is no signs of active infection at this time. No [name], [name] ([id number]) [id number] Page 8 of 10 fevers, chills, nausea, vomiting, or diarrhea. With that being said she continues to have a significant amount of pain at the site even though this is very close to complete closure. She also had several varicose veins around the area which were also problematic. Overall however I feel like the patient is making excellent progress. 11/28/2020 upon evaluation today patient appears to be doing well with regard to her leg ulcer. Again were not really able to debride or compression wrap her due to discomfort and pain. She does not allow for that. With that being said we have been using Iodosorb which does seem to be doing decently well. Fortunately there is no signs of active infection at this time which is great news. No fevers, chills, nausea, vomiting, or diarrhea. 8/31; patient presents for 2-week follow-up. She has been using Iodosorb. She reports that the wound is closed. She denies signs of infection. Readmission: 10-27-2021 upon evaluation this is a patient that presents today whom I have previously seen this is pretty much for the same issue  though I think a little bit higher than the last time I saw her. She does have a history of chronic venous insufficiency and hypertension along with varicose veins. Subsequently she does have an ulceration which spontaneously ruptured she has not been wearing any compression which I think is a big part of the issue here. We discussed this before I really think she probably needs to be wearing her compression therapy, she probably needs lymphedema pumps if she can ever wear the compression for a significant amount of time to get these, and subsequently also think that she needs to be elevating her legs is much as possible she may even need some vascular intervention in regard to her veins. All of this was reiterated and discussed with her today to reinforce what needs to happen in order to ensure that her legs do not get a lot worse. The patient voiced understanding. She tells me that she knows because she is seeing her mom go through a lot of this as well how bad things can get. 11-03-2021 upon evaluation today patient appears to be doing well with regard to her wound. Fortunately there does not appear to be any signs of active infection at this time. She is measuring a little bit bigger but I think this is because the wound is actually cleaning up a bit here. 7/27; left lateral leg wound not any smaller but perhaps with a cleaner surface. She is using Iodoflex to help with the latter and using Tubigrip. She has chronic venous insufficiency with secondary lymphedema. She is apparently followed by vein and vascular and is being scheduled for an ablation 11-17-2021 upon evaluation today patient appears to be doing well with regard to her wound this is actually showing signs of improvement which is great news. Fortunately I do not see any evidence of active infection locally or systemically at this time which is great news. No fevers, chills, nausea, vomiting, or diarrhea. 11-24-2021 upon evaluation today  patient's wound is actually showing signs of significant improvement. Unfortunately  she had quite a bit of pain with debridement last week I do believe it was beneficial but at the same time she is doing much better but still really does not want this debrided again I think being that it is looking a whole lot better I would try to avoid that today especially since it caused her so much discomfort that is really not the goal and I explained that to the patient today she voiced understanding and knows that it needed to be done but still states that it was quite painful. 11-30-2021 upon evaluation today patient appears to be doing excellent in regard to her wound this is actually showing signs of excellent improvement I am very pleased with where things stand. She does have her venous ablation appointment for October 17. 12-21-2021 upon evaluation today patient appears to be doing better in regard to her wound this is measuring smaller and looking better as well. Fortunately I do not see any signs of active infection locally or systemically which is great news. 01-01-2022 upon evaluation today patient appears to be doing well currently in regard to her wound. She is showing signs of improvement which is great news and overall I do not see any signs of active infection locally or systemically at this time. 01-08-2022 upon evaluation today patient appears to be doing well currently in regard to her wound she is actually showing signs of significant improvement which is great news. Fortunately I do not see any evidence of active infection locally or systemically at this time. I do believe that we are on the right track. She also has her appointment October 19 for the venous ablation. 01-16-2022 upon evaluation today patient's wound actually is showing some signs of improvement although this is very slow. Fortunately I do not see any evidence of infection at this time. The volume is a little bit more although the  size is smaller I think this is due to the fact that we are slowly cleaning this area out effectively. 11/6 continued improvement the patient is using Iodoflex and a Tubigrip E. She was supposed to have venous surgery at vein and vascular in [place] however somehow this is gotten delayed till December 14. 02-27-2022 upon evaluation today patient's wound is actually showing signs of being completely healed. Fortunately I do not see any evidence of active infection locally or systemically which is great news and overall I am extremely pleased with where we stand today. Unfortunately she does tell me that she postpone her venous ablation surgery until December 14 she tells me that she was not ready "financially" for this. 04/18/2022; Ms. [name] [name] is a 37 year old female with a past medical history of venous insufficiency that presents the clinic for a left lower extremity wound. She was seen almost 2 months ago for the same wound. This had healed with Iodoflex and Tubigrip. She states that the wound recently reopened. She has seen vein and vascular and plan is for ligation and stripping of the left great saphenous vein. She is not sure when this procedure is going to be scheduled. She has canceled it once before. She has had office compression wraps in the past however these do not stay on and create more of an issue for her. She would like to avoid this. She currently denies signs of infection. 1/10; patient presents for follow-up. She has been using Iodoflex to the wound bed. She states she has been using her Tubigrip however she does not have this on today.  She has no issues or complaints today. She denies signs of infection. 1/17; patient presents for follow-up. She has not been using Iodoflex to the wound bed. She reports acute pain. She denies increased warmth, erythema or purulent drainage to the left lower extremity. She states she has been using Tubigrip. She has information to order the  compression stockings but has not obtained them. 1/24; Patient had a wound culture done at last clinic visit that grew Proteus mirabilis and E. coli. She was prescribed levofloxacin and this should cover both bacteria. She has been taking the medication over the past week with improvement of symptoms to the wound bed. She has been using Hydrofera Blue under Tubigrip. She states the [facility] is sticking to her wound bed. 1/31; patient presents for follow-up. She has been using Medihoney to the wound bed with improvement in healing. She has been contacted by [facility] to order her antibiotic ointment. She is not sure yet if she is able to afford this. She received her compression stockings but states they did not fit well. She is working on returning these. She has been using Tubigrip. 2/7; patient presents for follow-up. She is been using Medihoney to the wound bed. She obtained her compression stockings and has been using these daily. She states that the [facility] antibiotic ointment is arriving in the mail tomorrow. She has no issues or complaints today. She denies systemic signs of infection. [name], [name] ([id number]) [id number] Page 9 of 10 Objective Constitutional Vitals Time Taken: 3:51 PM, Height: 66 in, Weight: 400 lbs, BMI: 64.6, Temperature: 98.2 F, Pulse: 98 bpm, Respiratory Rate: 18 breaths/min, Blood Pressure: 128/83 mmHg. General Notes: Left lower extremity: T the distal medial aspect there is an open wound with granulation tissue and nonviable tissue. No increased warmth, o erythema or purulent drainage. Integumentary (Hair, Skin) Wound #5 status is Open. Original cause of wound was Gradually Appeared. The date acquired was: 04/10/2022. The wound has been in treatment 5 weeks. The wound is located on the Left,Medial Lower Leg. The wound measures 2.2cm length x 3.4cm width x 0.2cm depth; 5.875cm^2 area and 1.175cm^3 volume. There is Fat Layer  (Subcutaneous Tissue) exposed. There is a medium amount of serosanguineous drainage noted. There is no granulation within the wound bed. There is a large (67-100%) amount of necrotic tissue within the wound bed including Eschar and Adherent Slough. Assessment Active Problems ICD-10 Chronic venous hypertension (idiopathic) with ulcer of left lower extremity Non-pressure chronic ulcer of other part of left lower leg with fat layer exposed Lymphedema, not elsewhere classified Patient's wound is stable. I debrided nonviable tissue. I recommended continuing Medihoney for now until the [facility] antibiotic ointment arrives. Once this arrives can use with [facility]. If the [facility] gets stuck just use the [facility] antibiotic ointment. Continue with compression stockings daily. Follow-up in 1 week. Procedures Wound #5 Pre-procedure diagnosis of Wound #5 is a Venous Leg Ulcer located on the Left,Medial Lower Leg .Severity of Tissue Pre Debridement is: Fat layer exposed. There was a Excisional Skin/Subcutaneous Tissue Debridement with a total area of 7.48 sq cm performed by Kalman Shan, MD. With the following instrument(s): Curette to remove Viable and Non-Viable tissue/material. Material removed includes Subcutaneous Tissue and Slough and. A time out was conducted at 16:04, prior to the start of the procedure. A Minimum amount of bleeding was controlled with Pressure. The procedure was tolerated well. Post Debridement Measurements: 2.2cm length x 3.4cm width x 0.1cm depth; 0.587cm^3 volume. Character of Wound/Ulcer  Post Debridement is stable. Severity of Tissue Post Debridement is: Fat layer exposed. Post procedure Diagnosis Wound #5: Same as Pre-Procedure Plan 1. In office sharp debridement 2. Medihoney 3. Once [facility] antibiotic ointment arrive stop Medihoney and start [place] along with Hydrofera Blue 4. Compression stockings daily 5. Follow-up in 1 week Electronic  Signature(s) Signed: 05/23/2022 4:08:55 PM By: Kalman Shan DO Entered By: Kalman Shan on 05/23/2022 16:08:17 [name] [name] Heads ([id number] Page 10 of 10 -------------------------------------------------------------------------------- SuperBill Details Patient Name: Date of Service: ALEASHA, [name] [name] 05/23/2022 Medical Record Number: [id number] Patient Account Number: [id number] Date of Birth/Sex: Treating RN: 1986/01/27 (37 y.o. Charolette Forward, Kim Primary Care Provider: [name] Other Clinician: [name] Referring Provider: Treating Provider/Extender: [name] in Treatment: 5 Diagnosis Coding ICD-10 Codes Code Description [id number] Chronic venous hypertension (idiopathic) with ulcer of left lower extremity L97.822 Non-pressure chronic ulcer of other part of left lower leg with fat layer exposed I89.0 Lymphedema, not elsewhere classified Facility Procedures : CPT4 Code: [id number] Description: [place] - DEB SUBQ TISSUE 20 SQ CM/< ICD-10 Diagnosis Description L97.822 Non-pressure chronic ulcer of other part of left lower leg with fat layer expo I87.312 Chronic venous hypertension (idiopathic) with ulcer of left lower extremity I89.0  Lymphedema, not elsewhere classified Modifier: sed Quantity: 1 Physician Procedures : CPT4 Code Description Modifier [id number] - WC PHYS SUBQ TISS 20 SQ CM ICD-10 Diagnosis Description L97.822 Non-pressure chronic ulcer of other part of left lower leg with fat layer exposed I87.312 Chronic venous hypertension (idiopathic) with ulcer  of left lower extremity I89.0 Lymphedema, not elsewhere classified Quantity: 1 Electronic Signature(s) Signed: 05/23/2022 4:08:55 PM By: Kalman Shan DO Entered By: Kalman Shan on 05/23/2022 16:08:27

## 2022-05-30 ENCOUNTER — Encounter (HOSPITAL_BASED_OUTPATIENT_CLINIC_OR_DEPARTMENT_OTHER): Payer: BC Managed Care – PPO | Admitting: Internal Medicine

## 2022-05-30 DIAGNOSIS — I87312 Chronic venous hypertension (idiopathic) with ulcer of left lower extremity: Secondary | ICD-10-CM | POA: Diagnosis not present

## 2022-05-30 DIAGNOSIS — I890 Lymphedema, not elsewhere classified: Secondary | ICD-10-CM | POA: Diagnosis not present

## 2022-05-30 DIAGNOSIS — L97822 Non-pressure chronic ulcer of other part of left lower leg with fat layer exposed: Secondary | ICD-10-CM

## 2022-06-02 NOTE — Progress Notes (Signed)
[name], [name] ([id number]) [id number] Page 1 of 11 Visit Report for 05/30/2022 Chief Complaint Document Details Patient Name: Date of Service: [name], [name] [place] J. 05/30/2022 3:30 PM Medical Record Number: [id number] Patient Account Number: [id number] Date of Birth/Sex: Treating RN: 1985-08-24 (37 y.o. [name] Primary Care Provider: [name] Other Clinician: [name] Referring Provider: Treating Provider/Extender: [name] in Treatment: 6 Information Obtained from: Patient Chief Complaint 04/18/2022; Left LE Ulcer Electronic Signature(s) Signed: 05/30/2022 4:12:52 PM By: Kalman Shan DO Entered By: Kalman Shan on 05/30/2022 16:09:29 -------------------------------------------------------------------------------- Debridement Details Patient Name: Date of Service: [name], [name] 05/30/2022 3:30 PM Medical Record Number: [id number] Patient Account Number: [id number] Date of Birth/Sex: Treating RN: 1985-08-03 (37 y.o. [name], [name] Primary Care Provider: [name] Other Clinician: [name] Referring Provider: Treating Provider/Extender: [name] in Treatment: 6 Debridement Performed for Assessment: Wound #5 Left,Medial Lower Leg Performed By: Physician Kalman Shan, MD Debridement Type: Debridement Severity of Tissue Pre Debridement: Fat layer exposed Level of Consciousness (Pre-procedure): Awake and Alert Pre-procedure Verification/Time Out Yes - 16:04 Taken: Start Time: 16:04 T Area Debrided (L x W): otal 1.5 (cm) x 3 (cm) = 4.5 (cm) Tissue and other material debrided: Viable, Non-Viable, Slough, Subcutaneous, Slough Level: Skin/Subcutaneous Tissue Debridement Description: Excisional Instrument: Curette Bleeding: Minimum Hemostasis Achieved: Pressure Response to Treatment: Procedure was tolerated well Level of Consciousness (Post- Awake  and Alert procedure): [name], [name] ([id number]) [id number] Page 2 of 11 Post Debridement Measurements of Total Wound Length: (cm) 1.5 Width: (cm) 3 Depth: (cm) 0.2 Volume: (cm) 0.707 Character of Wound/Ulcer Post Debridement: Stable Severity of Tissue Post Debridement: Fat layer exposed Post Procedure Diagnosis Same as Pre-procedure Electronic Signature(s) Signed: 05/30/2022 4:12:52 PM By: Kalman Shan DO Signed: 05/31/2022 11:57:56 AM By: [name] Signed: 06/01/2022 1:08:07 PM By: Gretta Cool, [name], RN, CWS, [name], [name] Entered By: [name] on 05/30/2022 16:05:35 -------------------------------------------------------------------------------- HPI Details Patient Name: Date of Service: [name], [name] 05/30/2022 3:30 PM Medical Record Number: [id number] Patient Account Number: [id number] Date of Birth/Sex: Treating RN: 29-Sep-1985 (37 y.o. [name] Primary Care Provider: [name] Other Clinician: [name] Referring Provider: Treating Provider/Extender: [name] in Treatment: 6 History of Present Illness HPI Description: 37 year old patient who started with having ulcerations on the right lower leg on the lateral part of her ankle for about 2 weeks. She was seen in the ER at [facility] and advised to see the wound care for a consultation. No X-rays of workup was done during the ER visit and no prescription for any medications of compression wraps were given. the patient is not diabetic but does have hypertension and her medications have been reviewed by me. 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 The right and left greater saphenous vein was found to be tortuous. Deep venous system was also not competent and there was reflux  of more than 500 ms. She was then seen by Dr. Darene Lamer Early who recommended that the patient would not benefit from endovenous ablation and he had recommended vein stripping odd on the right side and multiple small phlebectomy procedures on the left side. the patient did not follow-up due to social economic reasons. She has not been wearing any compression stockings and has not taken any  specific treatment for varicose veins for the last 3 years. 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 We have obtained some appointments to see the vascular surgeons by the end of the month and the patient would like to follow up with me at my [facility] on Wednesday, June 29. 10/14/2014 -- she could not see me yesterday in [place] and hence has come for a review today. She has a vascular workup to be done this afternoon at [facility]. She is doing fine otherwise. 10/22/2014 -- she was seen by Dr. Althea Charon and he has recommended surgical removal of her right saphenous vein from distal thigh to saphenofemoral odd junction and stab phlebectomy's of multiple large tributary branches throughout her thigh and calf. This would be done under general anesthesia in the outpatient setting. 10/29/2014 -- she is trying to work on a surgical date and in the meanwhile we have got insurance clearance for Apligraf and we will start this next week. 7/22 2016 -- she is here for the first application of Apligraf. 11/19/2014 -- she is here for a second application of Apligraf 11/26/2014 -- she has done fine after her last application of Apligraf and is awaiting her surgery which is scheduled for August 31. 12/03/2014 -- she is doing fine and is here for her third application of Apligraf. 12/21/2014 -- She had surgery on 12/15/2014 by Dr.Early who did #1 ligation and stripping of right great saphenous vein from distal thigh to  saphenofemoral junction, #2 stab phlebectomy of large tributary varicose veins in the thigh popliteal space and calf. She had an Ace wrap up to her groin and this was removed today and the Unna's boot was also removed. 12/28/2014 -- she is here for her fourth application of Apligraf. 01/06/2015 - he saw her vascular surgeon Dr. Donnetta Hutching who was pleased with her progress and he has confirmed that no surgical procedures could be attempted on the left side. 01/13/2015 -- her wound looks very good and she's been having no problems whatsoever. Readmission: AVARIELLA, BAZER ([id number]) [id number] Page 3 of 11 07/26/2020 upon evaluation today patient presents for initial inspection here in our clinic for a new issue with her left leg although she is previously been seen due to issues with the right leg back in 2016. At that time she was seeing Dr. Donnetta Hutching who is a vein/vascular specialist in [place]. He has since semiretired from what I understand. He is working out of [employer] I believe. Nonetheless she tells me at the time that there was really nothing to do for her left leg although the right leg was where they did most of the work. Subsequently she states that she is done fairly well until just in the past week where she had issues with bleeding from what appears to be varicose vein on the left leg medially. Unfortunately this has continued to be an issue although she tells me at first it was coming much more significantly Down quite a bit but nonetheless has not completely resolved. Every time she showers she notices that it starts to drain a little bit more. She does have a history of chronic venous insufficiency, lymphedema, varicose veins bilaterally, and obesity. 08/02/2020 upon evaluation today patient appears to be doing about the same in regards to the ulcer on her left leg. She has some eschar covering there is definitely some fluid collecting underneath  unfortunately. With  that being said she tells me she is still having a tremendous amount of pain therefore she is really not able to allow me to clean this off very effectively to be perfectly honest. I think we need to try to soften this up 08/16/2020 upon evaluation today patient's wound is really not doing significantly better not really states about the same. She notes that the wrap just does not seem to be staying up very well at all unfortunately. No fevers, chills, nausea, vomiting, or diarrhea. She did cut it off once it starts to slide in order to alleviate some of the pressure from sliding Down. Fortunately there is no signs of active infection at this time which is great news. 08/23/2020 upon evaluation today patient appears to be doing well 08/23/2020 upon evaluation today patient appears to be doing well with regard to her wound all things considered. Fortunately there does not appear to be any signs of active infection at this time which is great news. She has been tolerating the dressing changes without complication and overall I am extremely pleased with where things stand at this point. She does have her appointment with vascular in [facility] on June 9. 08/30/2020 upon evaluation today patient actually appears to be doing decently well in regard to her wound. Fortunately there is no signs of active infection which is great news. Nonetheless I do believe that the patient is going require little bit of debridement if she is okay with me attempting that today I think that will help clean off some of the necrotic tissue. Fortunately there does not appear to be otherwise any evidence of active infection which is also great news. 09/19/2020 upon evaluation today patient appears to be doing a little better in regard to her wound as compared to previous. Fortunately there does not appear to be any signs of active infection overall. No fever chills noted. I do believe that the Iodosorb has made this a  little bit better with regard to the overall size and appearance of the wound bed though again she does still have quite a ways to go to get this to heal she still very tender to touch. 09/27/2020 upon evaluation today patient appears to actually be doing quite well with regard to her wound. This is measuring much smaller which is great news. With that being said she did see vein and vascular in [facility] and they subsequently recommended that surgery is really what she probably needs to go [name] with sounds like the potential for venous ablation. With that being said the patient tells me this is just not the right time for her to be able to proceed with any type of surgery which I completely understand. Nonetheless I do believe that she would continue to benefit from compression but again that is really not something that she is able to easily do. 10/04/2020 upon evaluation today patient appears to be doing about the same in regard to her wound. This is measuring a little bit smaller but nonetheless still is open and again has some slough and biofilm noted on the surface of the wound. There does not appear to be any signs of active infection which is great news. No fevers, chills, nausea, vomiting, or diarrhea. 10/04/2020 upon evaluation today patient appears to be doing well with regard to her wound. She has been tolerating dressing changes without complication. Fortunately there does not appear to be any signs of active infection which is great news. No fevers, chills, nausea, vomiting, or  diarrhea. 10/25/2020 upon evaluation today patient appears to be doing well with regard to her wound. She has been tolerating the dressing changes without complication. Fortunately there is no signs of active infection at this time. No fevers, chills, nausea, vomiting, or diarrhea. 11/01/2020 upon evaluation today patient with regard to her wound. She has been tolerating the dressing changes without complication.  Fortunately there does not appear to be any signs of infection which is great news. No fever chills noted 11/15/2020 upon evaluation today patient appears to be doing well with regard to her wound. Fortunately there is no signs of active infection at this time. No fevers, chills, nausea, vomiting, or diarrhea. With that being said she continues to have a significant amount of pain at the site even though this is very close to complete closure. She also had several varicose veins around the area which were also problematic. Overall however I feel like the patient is making excellent progress. 11/28/2020 upon evaluation today patient appears to be doing well with regard to her leg ulcer. Again were not really able to debride or compression wrap her due to discomfort and pain. She does not allow for that. With that being said we have been using Iodosorb which does seem to be doing decently well. Fortunately there is no signs of active infection at this time which is great news. No fevers, chills, nausea, vomiting, or diarrhea. 8/31; patient presents for 2-week follow-up. She has been using Iodosorb. She reports that the wound is closed. She denies signs of infection. Readmission: 10-27-2021 upon evaluation this is a patient that presents today whom I have previously seen this is pretty much for the same issue though I think a little bit higher than the last time I saw her. She does have a history of chronic venous insufficiency and hypertension along with varicose veins. Subsequently she does have an ulceration which spontaneously ruptured she has not been wearing any compression which I think is a big part of the issue here. We discussed this before I really think she probably needs to be wearing her compression therapy, she probably needs lymphedema pumps if she can ever wear the compression for a significant amount of time to get these, and subsequently also think that she needs to be elevating her legs  is much as possible she may even need some vascular intervention in regard to her veins. All of this was reiterated and discussed with her today to reinforce what needs to happen in order to ensure that her legs do not get a lot worse. The patient voiced understanding. She tells me that she knows because she is seeing her mom go through a lot of this as well how bad things can get. 11-03-2021 upon evaluation today patient appears to be doing well with regard to her wound. Fortunately there does not appear to be any signs of active infection at this time. She is measuring a little bit bigger but I think this is because the wound is actually cleaning up a bit here. 7/27; left lateral leg wound not any smaller but perhaps with a cleaner surface. She is using Iodoflex to help with the latter and using Tubigrip. She has chronic venous insufficiency with secondary lymphedema. She is apparently followed by vein and vascular and is being scheduled for an ablation 11-17-2021 upon evaluation today patient appears to be doing well with regard to her wound this is actually showing signs of improvement which is great news. Fortunately I do not see  any evidence of active infection locally or systemically at this time which is great news. No fevers, chills, nausea, vomiting, or diarrhea. 11-24-2021 upon evaluation today patient's wound is actually showing signs of significant improvement. Unfortunately she had quite a bit of pain with debridement last week I do believe it was beneficial but at the same time she is doing much better but still really does not want this debrided again I think being that it is looking a whole lot better I would try to avoid that today especially since it caused her so much discomfort that is really not the goal and I explained that to the patient today she voiced understanding and knows that it needed to be done but still states that it was quite painful. 11-30-2021 upon evaluation today  patient appears to be doing excellent in regard to her wound this is actually showing signs of excellent improvement I am very pleased with where things stand. She does have her venous ablation appointment for October 17. 12-21-2021 upon evaluation today patient appears to be doing better in regard to her wound this is measuring smaller and looking better as well. Fortunately I do not see any signs of active infection locally or systemically which is great news. TIYANA, KERKSTRA ([id number]) [id number] Page 4 of 11 01-01-2022 upon evaluation today patient appears to be doing well currently in regard to her wound. She is showing signs of improvement which is great news and overall I do not see any signs of active infection locally or systemically at this time. 01-08-2022 upon evaluation today patient appears to be doing well currently in regard to her wound she is actually showing signs of significant improvement which is great news. Fortunately I do not see any evidence of active infection locally or systemically at this time. I do believe that we are on the right track. She also has her appointment October 19 for the venous ablation. 01-16-2022 upon evaluation today patient's wound actually is showing some signs of improvement although this is very slow. Fortunately I do not see any evidence of infection at this time. The volume is a little bit more although the size is smaller I think this is due to the fact that we are slowly cleaning this area out effectively. 11/6 continued improvement the patient is using Iodoflex and a Tubigrip E. She was supposed to have venous surgery at vein and vascular in [place] however somehow this is gotten delayed till December 14. 02-27-2022 upon evaluation today patient's wound is actually showing signs of being completely healed. Fortunately I do not see any evidence of active infection locally or systemically which is great news and  overall I am extremely pleased with where we stand today. Unfortunately she does tell me that she postpone her venous ablation surgery until December 14 she tells me that she was not ready "financially" for this. 04/18/2022; Ms. Donique Hogenson is a 37 year old female with a past medical history of venous insufficiency that presents the clinic for a left lower extremity wound. She was seen almost 2 months ago for the same wound. This had healed with Iodoflex and Tubigrip. She states that the wound recently reopened. She has seen vein and vascular and plan is for ligation and stripping of the left great saphenous vein. She is not sure when this procedure is going to be scheduled. She has canceled it once before. She has had office compression wraps in the past however these do not stay on and create more of  an issue for her. She would like to avoid this. She currently denies signs of infection. 1/10; patient presents for follow-up. She has been using Iodoflex to the wound bed. She states she has been using her Tubigrip however she does not have this on today. She has no issues or complaints today. She denies signs of infection. 1/17; patient presents for follow-up. She has not been using Iodoflex to the wound bed. She reports acute pain. She denies increased warmth, erythema or purulent drainage to the left lower extremity. She states she has been using Tubigrip. She has information to order the compression stockings but has not obtained them. 1/24; Patient had a wound culture done at last clinic visit that grew Proteus mirabilis and E. coli. She was prescribed levofloxacin and this should cover both bacteria. She has been taking the medication over the past week with improvement of symptoms to the wound bed. She has been using Hydrofera Blue under Tubigrip. She states the [facility] is sticking to her wound bed. 1/31; patient presents for follow-up. She has been using Medihoney to the wound bed with  improvement in healing. She has been contacted by [facility] to order her antibiotic ointment. She is not sure yet if she is able to afford this. She received her compression stockings but states they did not fit well. She is working on returning these. She has been using Tubigrip. 2/7; patient presents for follow-up. She is been using Medihoney to the wound bed. She obtained her compression stockings and has been using these daily. She states that the [facility] antibiotic ointment is arriving in the mail tomorrow. She has no issues or complaints today. She denies systemic signs of infection. 2/14; patient presents for follow-up. She has been using Keystone antibiotic ointment to the wound bed. She states the [facility] is sticking and she has stopped this. She is been using her compression stockings daily. She does not have these on today. Electronic Signature(s) Signed: 05/30/2022 4:12:52 PM By: Kalman Shan DO Entered By: Kalman Shan on 05/30/2022 16:09:59 -------------------------------------------------------------------------------- Physical Exam Details Patient Name: Date of Service: [name] 05/30/2022 3:30 PM Medical Record Number: [id number] Patient Account Number: [id number] Date of Birth/Sex: Treating RN: [birth year] (37 y.o. [name] Primary Care Provider: [name] Other Clinician: [name] Referring Provider: Treating Provider/Extender: [name] in Treatment: 6 Constitutional . Cardiovascular . Psychiatric . DORLISA, DELPHIA ([id number]) [id number] Page 5 of 11 Notes Left lower extremity: T the distal medial aspect there is an open wound with granulation tissue and nonviable tissue. No increased warmth, erythema or o purulent drainage. Electronic Signature(s) Signed: 05/30/2022 4:12:52 PM By: Kalman Shan DO Entered By: Kalman Shan on 05/30/2022  16:10:15 -------------------------------------------------------------------------------- Physician Orders Details Patient Name: Date of Service: [place], [place] 05/30/2022 3:30 PM Medical Record Number: [id number] Patient Account Number: [id number] Date of Birth/Sex: Treating RN: 1986-01-31 (37 y.o. [name] Primary Care Provider: [name] Other Clinician: [name] Referring Provider: Treating Provider/Extender: [name] in Treatment: 6 Verbal / Phone Orders: No Diagnosis Coding Follow-up Appointments Return Appointment in 1 week. Bathing/ Shower/ Hygiene May shower; gently cleanse wound with antibacterial soap, rinse and pat dry prior to dressing wounds Anesthetic (Use 'Patient Medications' Section for Anesthetic Order Entry) Lidocaine applied to wound bed Edema Control - Lymphedema / Segmental Compressive Device / Other Elevate, Exercise Daily and A void Standing for Long Periods of Time. Elevate legs to the level of the  heart and pump ankles as often as possible Elevate leg(s) parallel to the floor when sitting. Medications-Please add to medication list. Keystone Compound - keystone gel Wound Treatment Wound #5 - Lower Leg Wound Laterality: Left, Medial Cleanser: Byram Ancillary Kit - 15 Day Supply (Generic) 1 x Per Day/30 Days Discharge Instructions: Use supplies as instructed; Kit contains: (15) Saline Bullets; (15) 3x3 Gauze; 15 pr Gloves Prim Dressing: keystone ary 1 x Per Day/30 Days Secondary Dressing: (BORDER) Zetuvit Plus SILICONE BORDER Dressing 5x5 (in/in) 1 x Per Day/30 Days Discharge Instructions: Please do not put silicone bordered dressings under wraps. Use non-bordered dressing only. Secured With: Tubigrip Size F, 4x10 (in/yd) (Generic) 1 x Per Day/30 Days Discharge Instructions: double layer Apply Tubigrip F 3 finger-widths below knee to base of toes to secure dressing and/or for swelling. Electronic  Signature(s) Signed: 05/30/2022 4:12:52 PM By: Kalman Shan DO Entered By: Kalman Shan on 05/30/2022 16:12:08 [name] ([id number] Page 6 of 11 -------------------------------------------------------------------------------- Problem List Details Patient Name: Date of Service: [name], [name] [place] 05/30/2022 3:30 PM Medical Record Number: [id number] Patient Account Number: [id number] Date of Birth/Sex: Treating RN: 02/20/1986 (37 y.o. [name], [name] Primary Care Provider: [name] Other Clinician: [name] Referring Provider: Treating Provider/Extender: [name] in Treatment: 6 Active Problems ICD-10 Encounter Code Description Active Date MDM Diagnosis I87.312 Chronic venous hypertension (idiopathic) with ulcer of left lower extremity 04/18/2022 No Yes L97.822 Non-pressure chronic ulcer of other part of left lower leg with fat layer exposed1/06/2022 No Yes I89.0 Lymphedema, not elsewhere classified 04/18/2022 No Yes Inactive Problems Resolved Problems Electronic Signature(s) Signed: 05/30/2022 4:12:52 PM By: Kalman Shan DO Entered By: Kalman Shan on 05/30/2022 16:09:26 -------------------------------------------------------------------------------- Progress Note Details Patient Name: Date of Service: [name] 05/30/2022 3:30 PM Medical Record Number: [id number] Patient Account Number: [id number] Date of Birth/Sex: Treating RN: 12/23/1985 (37 y.o. [name], [name] Primary Care Provider: [name] Other Clinician: [name] Referring Provider: Treating Provider/Extender: [name] in Treatment: 6 Subjective Chief Complaint Information obtained from Patient [name], [name] ([id number]) [id number] Page 7 of 11 04/18/2022; Left LE Ulcer History of Present Illness (HPI) 37 year old patient who started with  having ulcerations on the right lower leg on the lateral part of her ankle for about 2 weeks. She was seen in the ER at [facility] and advised to see the wound care for a consultation. No X-rays of workup was done during the ER visit and no prescription for any medications of compression wraps were given. the patient is not diabetic but does have hypertension and her medications have been reviewed by me. 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 The right and left greater saphenous vein was found to be tortuous. Deep venous system was also not competent and there was reflux of more than 500 ms. She was then seen by Dr. Darene Lamer Early who recommended that the patient would not benefit from endovenous ablation and he had recommended vein stripping odd on the right side and multiple small phlebectomy procedures on the left side. the patient did not follow-up due to social economic reasons. She has not been wearing any compression stockings and has not taken any specific treatment for varicose veins for the last 3 years. 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 We have obtained some appointments to see the vascular surgeons by the end of the month and the patient would like to follow up with me at my [facility] on Wednesday, June 29. 10/14/2014 -- she could not see me yesterday in [place] and hence has come for a review today. She has a vascular workup to be done this afternoon at [facility]. She is doing fine otherwise. 10/22/2014 -- she was seen by Dr. Althea Charon and he has recommended surgical removal of her right saphenous vein from distal thigh to saphenofemoral odd junction and stab phlebectomy's of multiple large tributary  branches throughout her thigh and calf. This would be done under general anesthesia in the outpatient setting. 10/29/2014 -- she is trying to work on a surgical date and in the meanwhile we have got insurance clearance for Apligraf and we will start this next week. 7/22 2016 -- she is here for the first application of Apligraf. 11/19/2014 -- she is here for a second application of Apligraf 11/26/2014 -- she has done fine after her last application of Apligraf and is awaiting her surgery which is scheduled for August 31. 12/03/2014 -- she is doing fine and is here for her third application of Apligraf. 12/21/2014 -- She had surgery on 12/15/2014 by Dr.Early who did #1 ligation and stripping of right great saphenous vein from distal thigh to saphenofemoral junction, #2 stab phlebectomy of large tributary varicose veins in the thigh popliteal space and calf. She had an Ace wrap up to her groin and this was removed today and the Unna's boot was also removed. 12/28/2014 -- she is here for her fourth application of Apligraf. 01/06/2015 - he saw her vascular surgeon Dr. Donnetta Hutching who was pleased with her progress and he has confirmed that no surgical procedures could be attempted on the left side. 01/13/2015 -- her wound looks very good and she's been having no problems whatsoever. Readmission: 07/26/2020 upon evaluation today patient presents for initial inspection here in our clinic for a new issue with her left leg although she is previously been seen due to issues with the right leg back in 2016. At that time she was seeing Dr. Donnetta Hutching who is a vein/vascular specialist in [place]. He has since semiretired from what I understand. He is working out of [employer] I believe. Nonetheless she tells me at the time that there was really nothing to do for her left leg although the right leg was where they did most of the work. Subsequently she states that she is done fairly well until just in the past week where  she had issues with bleeding from what appears to be varicose vein on the left leg medially. Unfortunately this has continued to be an issue although she tells me at first it was coming much more significantly Down quite a bit but nonetheless has not completely resolved. Every time she showers she notices that it starts to drain a little bit more. She does have a history of chronic venous insufficiency, lymphedema, varicose veins bilaterally, and obesity. 08/02/2020 upon evaluation today patient appears to be doing about the same in regards to the ulcer on her left leg. She has some eschar covering there is definitely some fluid collecting underneath unfortunately. With that being said she tells me she is still having a tremendous amount of pain therefore she is really not able to allow me to clean  this off very effectively to be perfectly honest. I think we need to try to soften this up 08/16/2020 upon evaluation today patient's wound is really not doing significantly better not really states about the same. She notes that the wrap just does not seem to be staying up very well at all unfortunately. No fevers, chills, nausea, vomiting, or diarrhea. She did cut it off once it starts to slide in order to alleviate some of the pressure from sliding Down. Fortunately there is no signs of active infection at this time which is great news. 08/23/2020 upon evaluation today patient appears to be doing well 08/23/2020 upon evaluation today patient appears to be doing well with regard to her wound all things considered. Fortunately there does not appear to be any signs of active infection at this time which is great news. She has been tolerating the dressing changes without complication and overall I am extremely pleased with where things stand at this point. She does have her appointment with vascular in [facility] on June 9. 08/30/2020 upon evaluation today patient actually appears to be doing decently well in  regard to her wound. Fortunately there is no signs of active infection which is great news. Nonetheless I do believe that the patient is going require little bit of debridement if she is okay with me attempting that today I think that will help clean off some of the necrotic tissue. Fortunately there does not appear to be otherwise any evidence of active infection which is also great news. 09/19/2020 upon evaluation today patient appears to be doing a little better in regard to her wound as compared to previous. Fortunately there does not appear to be any signs of active infection overall. No fever chills noted. I do believe that the Iodosorb has made this a little bit better with regard to the overall size and appearance of the wound bed though again she does still have quite a ways to go to get this to heal she still very tender to touch. 09/27/2020 upon evaluation today patient appears to actually be doing quite well with regard to her wound. This is measuring much smaller which is great news. With that being said she did see vein and vascular in [facility] and they subsequently recommended that surgery is really what she probably needs to go [name] with sounds like the potential for venous ablation. With that being said the patient tells me this is just not the right time for her to be able to proceed with any type of surgery which I completely understand. Nonetheless I do believe that she would continue to benefit from compression but again that is really not something that she is able to easily do. 10/04/2020 upon evaluation today patient appears to be doing about the same in regard to her wound. This is measuring a little bit smaller but nonetheless still is open and again has some slough and biofilm noted on the surface of the wound. There does not appear to be any signs of active infection which is great news. No fevers, chills, nausea, vomiting, or diarrhea. 10/04/2020 upon evaluation today  patient appears to be doing well with regard to her wound. She has been tolerating dressing changes without complication. Fortunately there does not appear to be any signs of active infection which is great news. No fevers, chills, nausea, vomiting, or diarrhea. 10/25/2020 upon evaluation today patient appears to be doing well with regard to her wound. She has been tolerating the dressing changes without complication.  Fortunately there is no signs of active infection at this time. No fevers, chills, nausea, vomiting, or diarrhea. 11/01/2020 upon evaluation today patient with regard to her wound. She has been tolerating the dressing changes without complication. Fortunately there does not appear to be any signs of infection which is great news. No fever chills noted 11/15/2020 upon evaluation today patient appears to be doing well with regard to her wound. Fortunately there is no signs of active infection at this time. No [name], [name] ([id number]) [id number] Page 8 of 11 fevers, chills, nausea, vomiting, or diarrhea. With that being said she continues to have a significant amount of pain at the site even though this is very close to complete closure. She also had several varicose veins around the area which were also problematic. Overall however I feel like the patient is making excellent progress. 11/28/2020 upon evaluation today patient appears to be doing well with regard to her leg ulcer. Again were not really able to debride or compression wrap her due to discomfort and pain. She does not allow for that. With that being said we have been using Iodosorb which does seem to be doing decently well. Fortunately there is no signs of active infection at this time which is great news. No fevers, chills, nausea, vomiting, or diarrhea. 8/31; patient presents for 2-week follow-up. She has been using Iodosorb. She reports that the wound is closed. She denies signs of  infection. Readmission: 10-27-2021 upon evaluation this is a patient that presents today whom I have previously seen this is pretty much for the same issue though I think a little bit higher than the last time I saw her. She does have a history of chronic venous insufficiency and hypertension along with varicose veins. Subsequently she does have an ulceration which spontaneously ruptured she has not been wearing any compression which I think is a big part of the issue here. We discussed this before I really think she probably needs to be wearing her compression therapy, she probably needs lymphedema pumps if she can ever wear the compression for a significant amount of time to get these, and subsequently also think that she needs to be elevating her legs is much as possible she may even need some vascular intervention in regard to her veins. All of this was reiterated and discussed with her today to reinforce what needs to happen in order to ensure that her legs do not get a lot worse. The patient voiced understanding. She tells me that she knows because she is seeing her mom go through a lot of this as well how bad things can get. 11-03-2021 upon evaluation today patient appears to be doing well with regard to her wound. Fortunately there does not appear to be any signs of active infection at this time. She is measuring a little bit bigger but I think this is because the wound is actually cleaning up a bit here. 7/27; left lateral leg wound not any smaller but perhaps with a cleaner surface. She is using Iodoflex to help with the latter and using Tubigrip. She has chronic venous insufficiency with secondary lymphedema. She is apparently followed by vein and vascular and is being scheduled for an ablation 11-17-2021 upon evaluation today patient appears to be doing well with regard to her wound this is actually showing signs of improvement which is great news. Fortunately I do not see any evidence of  active infection locally or systemically at this time which is great news. No  fevers, chills, nausea, vomiting, or diarrhea. 11-24-2021 upon evaluation today patient's wound is actually showing signs of significant improvement. Unfortunately she had quite a bit of pain with debridement last week I do believe it was beneficial but at the same time she is doing much better but still really does not want this debrided again I think being that it is looking a whole lot better I would try to avoid that today especially since it caused her so much discomfort that is really not the goal and I explained that to the patient today she voiced understanding and knows that it needed to be done but still states that it was quite painful. 11-30-2021 upon evaluation today patient appears to be doing excellent in regard to her wound this is actually showing signs of excellent improvement I am very pleased with where things stand. She does have her venous ablation appointment for October 17. 12-21-2021 upon evaluation today patient appears to be doing better in regard to her wound this is measuring smaller and looking better as well. Fortunately I do not see any signs of active infection locally or systemically which is great news. 01-01-2022 upon evaluation today patient appears to be doing well currently in regard to her wound. She is showing signs of improvement which is great news and overall I do not see any signs of active infection locally or systemically at this time. 01-08-2022 upon evaluation today patient appears to be doing well currently in regard to her wound she is actually showing signs of significant improvement which is great news. Fortunately I do not see any evidence of active infection locally or systemically at this time. I do believe that we are on the right track. She also has her appointment October 19 for the venous ablation. 01-16-2022 upon evaluation today patient's wound actually is showing some  signs of improvement although this is very slow. Fortunately I do not see any evidence of infection at this time. The volume is a little bit more although the size is smaller I think this is due to the fact that we are slowly cleaning this area out effectively. 11/6 continued improvement the patient is using Iodoflex and a Tubigrip E. She was supposed to have venous surgery at vein and vascular in [place] however somehow this is gotten delayed till December 14. 02-27-2022 upon evaluation today patient's wound is actually showing signs of being completely healed. Fortunately I do not see any evidence of active infection locally or systemically which is great news and overall I am extremely pleased with where we stand today. Unfortunately she does tell me that she postpone her venous ablation surgery until December 14 she tells me that she was not ready "financially" for this. 04/18/2022; Ms. Zandalee Gehr is a 37 year old female with a past medical history of venous insufficiency that presents the clinic for a left lower extremity wound. She was seen almost 2 months ago for the same wound. This had healed with Iodoflex and Tubigrip. She states that the wound recently reopened. She has seen vein and vascular and plan is for ligation and stripping of the left great saphenous vein. She is not sure when this procedure is going to be scheduled. She has canceled it once before. She has had office compression wraps in the past however these do not stay on and create more of an issue for her. She would like to avoid this. She currently denies signs of infection. 1/10; patient presents for follow-up. She has been using Iodoflex  to the wound bed. She states she has been using her Tubigrip however she does not have this on today. She has no issues or complaints today. She denies signs of infection. 1/17; patient presents for follow-up. She has not been using Iodoflex to the wound bed. She reports acute pain. She  denies increased warmth, erythema or purulent drainage to the left lower extremity. She states she has been using Tubigrip. She has information to order the compression stockings but has not obtained them. 1/24; Patient had a wound culture done at last clinic visit that grew Proteus mirabilis and E. coli. She was prescribed levofloxacin and this should cover both bacteria. She has been taking the medication over the past week with improvement of symptoms to the wound bed. She has been using Hydrofera Blue under Tubigrip. She states the [facility] is sticking to her wound bed. 1/31; patient presents for follow-up. She has been using Medihoney to the wound bed with improvement in healing. She has been contacted by [facility] to order her antibiotic ointment. She is not sure yet if she is able to afford this. She received her compression stockings but states they did not fit well. She is working on returning these. She has been using Tubigrip. 2/7; patient presents for follow-up. She is been using Medihoney to the wound bed. She obtained her compression stockings and has been using these daily. She states that the [facility] antibiotic ointment is arriving in the mail tomorrow. She has no issues or complaints today. She denies systemic signs of infection. 2/14; patient presents for follow-up. She has been using Keystone antibiotic ointment to the wound bed. She states the [facility] is sticking and she has stopped this. She is been using her compression stockings daily. She does not have these on today. [name], [name] ([id number]) [id number] Page 9 of 11 Objective Constitutional Vitals Time Taken: 3:51 PM, Height: 66 in, Weight: 400 lbs, BMI: 64.6, Temperature: 98.1 F, Pulse: 98 bpm, Respiratory Rate: 18 breaths/min, Blood Pressure: 123/86 mmHg. General Notes: Left lower extremity: T the distal medial aspect there is an open wound with granulation tissue and  nonviable tissue. No increased warmth, o erythema or purulent drainage. Integumentary (Hair, Skin) Wound #5 status is Open. Original cause of wound was Gradually Appeared. The date acquired was: 04/10/2022. The wound has been in treatment 6 weeks. The wound is located on the Left,Medial Lower Leg. The wound measures 1.5cm length x 3cm width x 0.2cm depth; 3.534cm^2 area and 0.707cm^3 volume. There is Fat Layer (Subcutaneous Tissue) exposed. There is a medium amount of serosanguineous drainage noted. There is no granulation within the wound bed. There is a large (67-100%) amount of necrotic tissue within the wound bed including Eschar and Adherent Slough. 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 I debrided nonviable tissue. I recommended continuing with Keystone antibiotic ointment and foam border dressing. She can stop the [facility] since she is having issues with this. Continue compression stockings daily. Follow-up in 1 week. Procedures Wound #5 Pre-procedure diagnosis of Wound #5 is a Venous Leg Ulcer located on the Left,Medial Lower Leg .Severity of Tissue Pre Debridement is: Fat layer exposed. There was a Excisional Skin/Subcutaneous Tissue Debridement with a total area of 4.5 sq cm performed by Kalman Shan, MD. With  the following instrument(s): Curette to remove Viable and Non-Viable tissue/material. Material removed includes Subcutaneous Tissue and Slough and. A time out was conducted at 16:04, prior to the start of the procedure. A Minimum amount of bleeding was controlled with Pressure. The procedure was tolerated well. Post Debridement Measurements: 1.5cm length x 3cm width x 0.2cm depth; 0.707cm^3  volume. Character of Wound/Ulcer Post Debridement is stable. Severity of Tissue Post Debridement is: Fat layer exposed. Post procedure Diagnosis Wound #5: Same as Pre-Procedure Plan Follow-up Appointments: Return Appointment in 1 week. Bathing/ Shower/ Hygiene: May shower; gently cleanse wound with antibacterial soap, rinse and pat dry prior to dressing wounds Anesthetic (Use 'Patient Medications' Section for Anesthetic Order Entry): Lidocaine applied to wound bed Edema Control - Lymphedema / Segmental Compressive Device / Other: Elevate, Exercise Daily and Avoid Standing for Long Periods of Time. Elevate legs to the level of the heart and pump ankles as often as possible Elevate leg(s) parallel to the floor when sitting. Medications-Please add to medication list.: Keystone Compound - keystone gel WOUND #5: - Lower Leg Wound Laterality: Left, Medial Cleanser: Byram Ancillary Kit - 15 Day Supply (Generic) 1 x Per Day/30 Days Discharge Instructions: Use supplies as instructed; Kit contains: (15) Saline Bullets; (15) 3x3 Gauze; 15 pr Gloves Prim Dressing: keystone 1 x Per Day/30 Days ary Secondary Dressing: (BORDER) Zetuvit Plus SILICONE BORDER Dressing 5x5 (in/in) 1 x Per Day/30 Days Discharge Instructions: Please do not put silicone bordered dressings under wraps. Use non-bordered dressing only. Secured With: Tubigrip Size F, 4x10 (in/yd) (Generic) 1 x Per Day/30 Days Discharge Instructions: double layer Apply Tubigrip F 3 finger-widths below knee to base of toes to secure dressing and/or for swelling. 1. In office sharp debridement 2. Keystone antibiotic ointment 3. Compression stocking daily AITZA, FRANEY ([id number]) [phone] Page 10 of 11 4. Follow-up in 1 week Electronic Signature(s) Signed: 05/30/2022 4:12:52 PM By: Kalman Shan DO Entered By: Kalman Shan on 05/30/2022  16:11:51 -------------------------------------------------------------------------------- ROS/PFSH Details Patient Name: Date of Service: [name] Corporal MA J. 05/30/2022 3:30 PM Medical Record Number: [id number] Patient Account Number: [id number] Date of Birth/Sex: Treating RN: 01-03-1986 (37 y.o. [name], [name] Primary Care Provider: [name] Other Clinician: [name] Referring Provider: Treating Provider/Extender: [name] in Treatment: 6 Information Obtained From Patient Cardiovascular Medical History: Positive for: Hypertension; Peripheral Venous Disease Endocrine Medical History: Past Medical History Notes: pre DM, A1C 5.5 12/21 Immunizations Pneumococcal Vaccine: Received Pneumococcal Vaccination: No Implantable Devices None Hospitalization / Surgery History Type of Hospitalization/Surgery umb hernia repair right lower leg vein surgery Family and Social History Never smoker; Marital Status - Single; Alcohol Use: Rarely; Drug Use: Current History - marijuana; Caffeine Use: Rarely; Financial Concerns: No; Food, Clothing or Shelter Needs: No; Support System Lacking: No; Transportation Concerns: No Electronic Signature(s) Signed: 05/30/2022 4:12:52 PM By: Kalman Shan DO Signed: 06/01/2022 1:08:07 PM By: Gretta Cool, [name], RN, CWS, [name], [name] Entered By: Kalman Shan on 05/30/2022 16:12:26 Hanya, Parquette Herminio Heads ([id number] Page 11 of 11 -------------------------------------------------------------------------------- SuperBill Details Patient Name: Date of Service: ELASIA, VANDEWALKER [place]. 05/30/2022 Medical Record Number: [id number] Patient Account Number: [id number] Date of Birth/Sex: Treating RN: [birth year] (37 y.o. [name] Primary Care Provider: [name] Other Clinician: [name] Referring Provider: Treating Provider/Extender: [name] in  Treatment: 6 Diagnosis Coding ICD-10 Codes Code Description [phone] Chronic venous hypertension (idiopathic) with ulcer of left lower extremity L97.822 Non-pressure chronic ulcer of other part of left lower leg  with fat layer exposed I89.0 Lymphedema, not elsewhere classified Facility Procedures : CPT4 Code: [id number] Description: [id number] - DEB SUBQ TISSUE 20 SQ CM/< ICD-10 Diagnosis Description I87.312 Chronic venous hypertension (idiopathic) with ulcer of left lower extremity L97.822 Non-pressure chronic ulcer of other part of left lower leg with fat layer expo I89.0  Lymphedema, not elsewhere classified Modifier: sed Quantity: 1 Physician Procedures : CPT4 Code Description Modifier [id number] - WC PHYS SUBQ TISS 20 SQ CM ICD-10 Diagnosis Description I87.312 Chronic venous hypertension (idiopathic) with ulcer of left lower extremity L97.822 Non-pressure chronic ulcer of other part of left lower  leg with fat layer exposed I89.0 Lymphedema, not elsewhere classified Quantity: 1 Electronic Signature(s) Signed: 05/30/2022 4:12:52 PM By: Kalman Shan DO Entered By: Kalman Shan on 05/30/2022 16:12:00

## 2022-06-02 NOTE — Progress Notes (Signed)
TASHELL, ASLAM ([id number]) [id number] Page 1 of 9 Visit Report for 05/30/2022 Arrival Information Details Patient Name: Date of Service: [name], [name] [place] J. 05/30/2022 3:30 PM Medical Record Number: [id number] Patient Account Number: [id number] Date of Birth/Sex: Treating RN: 12-28-1985 (37 y.o. Charolette Forward, Kim Primary Care Jakelin Taussig: Salvadore [name] Other Clinician: [name] Referring Hakop Humbarger: Treating Rylin Saez/Extender: [name] in Treatment: 6 Visit Information History Since Last Visit All ordered tests and consults were completed: No Patient Arrived: Ambulatory Added or deleted any medications: No Arrival Time: 15:50 Any new allergies or adverse reactions: No Transfer Assistance: None Had a fall or experienced change in No Patient Requires Transmission-Based Precautions: No activities of daily living that may affect Patient Has Alerts: No risk of falls: Signs or symptoms of abuse/neglect since last visito No Hospitalized since last visit: No Implantable device outside of the clinic excluding No cellular tissue based products placed in the center since last visit: Has Dressing in Place as Prescribed: Yes Pain Present Now: Yes Electronic Signature(s) Signed: 05/31/2022 11:57:56 AM By: [name] Entered By: [name] on 05/30/2022 15:50:49 -------------------------------------------------------------------------------- Clinic Level of Care Assessment Details Patient Name: Date of Service: [name], [name] [place] J. 05/30/2022 3:30 PM Medical Record Number: [id number] Patient Account Number: [id number] Date of Birth/Sex: Treating RN: 1986-03-28 (37 y.o. [name] Primary Care Hurschel Paynter: Salvadore [name] Other Clinician: [name] Referring Thalia Turkington: Treating Luevenia Mcavoy/Extender: [name] in Treatment: 6 Clinic Level of Care Assessment Items TOOL 1 Quantity Score []$  -  0 Use when EandM and Procedure is performed on INITIAL visit ASSESSMENTS - Nursing Assessment / Reassessment []$  - 0 General Physical Exam (combine w/ comprehensive assessment (listed just below) when performed on new pt. evals) []$  - 0 Comprehensive Assessment (HX, ROS, Risk Assessments, Wounds Hx, etc.) RADENE, LIMONE ([id number]) [id number] Page 2 of 9 ASSESSMENTS - Wound and Skin Assessment / Reassessment []$  - 0 Dermatologic / Skin Assessment (not related to wound area) ASSESSMENTS - Ostomy and/or Continence Assessment and Care []$  - 0 Incontinence Assessment and Management []$  - 0 Ostomy Care Assessment and Management (repouching, etc.) PROCESS - Coordination of Care []$  - 0 Simple Patient / Family Education for ongoing care []$  - 0 Complex (extensive) Patient / Family Education for ongoing care []$  - 0 Staff obtains Programmer, systems, Records, T Results / Process Orders est []$  - 0 Staff telephones HHA, Nursing Homes / Clarify orders / etc []$  - 0 Routine Transfer to another Facility (non-emergent condition) []$  - 0 Routine Hospital Admission (non-emergent condition) []$  - 0 New Admissions / Biomedical engineer / Ordering NPWT Apligraf, etc. , []$  - 0 Emergency Hospital Admission (emergent condition) PROCESS - Special Needs []$  - 0 Pediatric / Minor Patient Management []$  - 0 Isolation Patient Management []$  - 0 Hearing / Language / Visual special needs []$  - 0 Assessment of Community assistance (transportation, D/C planning, etc.) []$  - 0 Additional assistance / Altered mentation []$  - 0 Support Surface(s) Assessment (bed, cushion, seat, etc.) INTERVENTIONS - Miscellaneous []$  - 0 External ear exam []$  - 0 Patient Transfer (multiple staff / Civil Service fast streamer / Similar devices) []$  - 0 Simple Staple / Suture removal (25 or less) []$  - 0 Complex Staple / Suture removal (26 or more) []$  - 0 Hypo/Hyperglycemic Management (do not check if billed separately) []$  -  0 Ankle / Brachial Index (ABI) - do not check if billed separately Has the patient been seen at the hospital within the last three years:  Yes Total Score: 0 Level Of Care: ____ Electronic Signature(s) Signed: 05/31/2022 11:57:56 AM By: [name] Entered By: [name] on 05/30/2022 16:08:04 -------------------------------------------------------------------------------- Encounter Discharge Information Details Patient Name: Date of Service: [name] 05/30/2022 3:30 PM Medical Record Number: [id number] Patient Account Number: [id number] Date of Birth/Sex: Treating RN: [birth year] (37 y.o. [name] Primary Care Abrahan Fulmore: Salvadore [name] Other Clinician: [name] Referring Cyrene Gharibian: Treating Kahari Critzer/Extender: Angelie, Stiffler, Herminio Heads ([id number]) [id number] Page 3 of 9 Weeks in Treatment: 6 Encounter Discharge Information Items Post Procedure Vitals Discharge Condition: Stable Temperature (F): 98.1 Ambulatory Status: Ambulatory Pulse (bpm): 98 Discharge Destination: Home Respiratory Rate (breaths/min): 18 Transportation: Private Auto Blood Pressure (mmHg): 123/86 Accompanied By: self Schedule Follow-up Appointment: Yes Clinical Summary of Care: Electronic Signature(s) Signed: 05/31/2022 11:57:56 AM By: [name] Entered By: [name] on 05/30/2022 16:18:26 -------------------------------------------------------------------------------- Lower Extremity Assessment Details Patient Name: Date of Service: [name], [name] 05/30/2022 3:30 PM Medical Record Number: [id number] Patient Account Number: [id number] Date of Birth/Sex: Treating RN: [birth year] (37 y.o. Charolette Forward, Kim Primary Care Antwane Grose: Salvadore [name] Other Clinician: [name] Referring Encarnacion Bole: Treating Nuel Dejaynes/Extender: [name] in Treatment: 6 Edema Assessment Assessed: Shirlyn Goltz: Yes]  Patrice Paradise: No] Edema: [Left: Ye] [Right: s] Calf Left: Right: Point of Measurement: 32 cm From Medial Instep 62 cm Ankle Left: Right: Point of Measurement: 12 cm From Medial Instep 34.4 cm Vascular Assessment Pulses: Dorsalis Pedis Palpable: [Left:Yes] Electronic Signature(s) Signed: 05/31/2022 11:57:56 AM By: [name] Signed: 06/01/2022 1:08:07 PM By: Gretta Cool, [name], RN, CWS, [name], [name] Entered By: [name] on 05/30/2022 Berkey, Lonsdale J ([id number] Page 4 of 9 -------------------------------------------------------------------------------- Multi Wound Chart Details Patient Name: Date of Service: ADAMINA, LAVELLE [place] J. 05/30/2022 3:30 PM Medical Record Number: [id number] Patient Account Number: [id number] Date of Birth/Sex: Treating RN: 1986-02-04 (37 y.o. Charolette Forward, Kim Primary Care Stuart Mirabile: Salvadore [name] Other Clinician: [name] Referring Rooney Swails: Treating Jesslynn Kruck/Extender: [name] in Treatment: 6 Vital Signs Height(in): 66 Pulse(bpm): 98 Weight(lbs): 400 Blood Pressure(mmHg): 123/86 Body Mass Index(BMI): 64.6 Temperature(F): 98.1 Respiratory Rate(breaths/min): 18 [5:Photos:] [N/A:N/A] Left, Medial Lower Leg N/A N/A Wound Location: Gradually Appeared N/A N/A Wounding Event: Venous Leg Ulcer N/A N/A Primary Etiology: Hypertension, Peripheral Venous N/A N/A Comorbid History: Disease 04/10/2022 N/A N/A Date Acquired: 6 N/A N/A Weeks of Treatment: Open N/A N/A Wound Status: No N/A N/A Wound Recurrence: 1.5x3x0.2 N/A N/A Measurements L x W x D (cm) 3.534 N/A N/A A (cm) : rea 0.707 N/A N/A Volume (cm) : 35.70% N/A N/A % Reduction in Area: 35.70% N/A N/A % Reduction in Volume: Full Thickness Without Exposed N/A N/A Classification: Support Structures Medium N/A N/A Exudate A mount: Serosanguineous N/A N/A Exudate Type: red, brown N/A N/A Exudate  Color: None Present (0%) N/A N/A Granulation Amount: Large (67-100%) N/A N/A Necrotic Amount: Eschar, Adherent Slough N/A N/A Necrotic Tissue: Fat Layer (Subcutaneous Tissue): Yes N/A N/A Exposed Structures: Fascia: No Tendon: No Muscle: No Joint: No Bone: No None N/A N/A Epithelialization: Treatment Notes Electronic Signature(s) Signed: 05/31/2022 11:57:56 AM By: [name] Entered By: [name] on 05/30/2022 16:00:29 Truby, Herminio Heads ([id number] Page 5 of 9 -------------------------------------------------------------------------------- Multi-Disciplinary Care Plan Details Patient Name: Date of Service: MARGARETE, WORLAND [place] J. 05/30/2022 3:30 PM Medical Record Number: [id number] Patient Account Number: [id number] Date of Birth/Sex: Treating RN: 1986-04-10 (37 y.o. [name] Primary Care Karmella Bouvier: Salvadore [name] Other Clinician: [name] Referring Vandy Tsuchiya:  Treating Joshu Furukawa/Extender: [name] in Treatment: 6 Active Inactive Necrotic Tissue Nursing Diagnoses: Knowledge deficit related to management of necrotic/devitalized tissue Goals: Patient/caregiver will verbalize understanding of reason and process for debridement of necrotic tissue Date Initiated: 04/18/2022 Target Resolution Date: 05/19/2022 Goal Status: Active Interventions: Assess patient pain level pre-, during and post procedure and prior to discharge Provide education on necrotic tissue and debridement process Notes: Venous Leg Ulcer Nursing Diagnoses: Knowledge deficit related to disease process and management Goals: Patient will maintain optimal edema control Date Initiated: 04/18/2022 Target Resolution Date: 05/19/2022 Goal Status: Active Interventions: Assess peripheral edema status every visit. Compression as ordered Notes: Wound/Skin Impairment Nursing Diagnoses: Knowledge deficit related to ulceration/compromised  skin integrity Goals: Patient/caregiver will verbalize understanding of skin care regimen Date Initiated: 04/18/2022 Target Resolution Date: 05/19/2022 Goal Status: Active Ulcer/skin breakdown will have a volume reduction of 30% by week 4 Date Initiated: 04/18/2022 Target Resolution Date: 05/19/2022 Goal Status: Active Ulcer/skin breakdown will have a volume reduction of 50% by week 8 Date Initiated: 04/18/2022 Target Resolution Date: 06/18/2022 Goal Status: Active Ulcer/skin breakdown will have a volume reduction of 80% by week 12 Date Initiated: 04/18/2022 Target Resolution Date: 07/18/2022 Goal Status: Active Ulcer/skin breakdown will heal within 14 weeks Date Initiated: 04/18/2022 Target Resolution Date: 08/17/2022 ASTARA, MAROCCO ([id number]) [phone].pdf Page 6 of 9 Goal Status: Active Interventions: Assess patient/caregiver ability to obtain necessary supplies Assess patient/caregiver ability to perform ulcer/skin care regimen upon admission and as needed Assess ulceration(s) every visit Notes: Electronic Signature(s) Signed: 05/31/2022 11:57:56 AM By: [name] Signed: 06/01/2022 1:08:07 PM By: Gretta Cool, [name], RN, CWS, [name], [name] Entered By: [name] on 05/30/2022 16:17:25 -------------------------------------------------------------------------------- Pain Assessment Details Patient Name: Date of Service: [place], [place] 05/30/2022 3:30 PM Medical Record Number: [id number] Patient Account Number: [id number] Date of Birth/Sex: Treating RN: Sep 07, 1985 (37 y.o. [name] Primary Care Doris Mcgilvery: Salvadore [name] Other Clinician: [name] Referring Cedric Mcclaine: Treating Guilford Shannahan/Extender: [name] in Treatment: 6 Active Problems Location of Pain Severity and Description of Pain Patient Has Paino No Site Locations Duration of the Pain. Constant / Intermittento Constant Rate the pain. Current Pain Level:  8 Character of Pain Describe the Pain: Shooting, Throbbing Pain Management and Medication Current Pain Management: Medication: Yes Cold Application: No Rest: No Massage: Yes Activity: No T.E.N.S.: No Heat Application: No Leg drop or elevation: No Is the Current Pain Management Adequate: Inadequate How does your wound impact your activities of daily livingo Sleep: No Bathing: No Appetite: No Relationship With Others: No Bladder Continence: No Emotions: No GENIEVA, ROCHEL ([id number]) [phone].pdf Page 7 of 9 Bowel Continence: No Work: No Toileting: No Drive: No Dressing: No Hobbies: No Electronic Signature(s) Signed: 05/31/2022 11:57:56 AM By: [name] Signed: 06/01/2022 1:08:07 PM By: Gretta Cool, [name], RN, CWS, [name], [name] Entered By: [name] on 05/30/2022 15:53:52 -------------------------------------------------------------------------------- Patient/Caregiver Education Details Patient Name: Date of Service: [name] Corporal MA J. 2/14/2024andnbsp3:30 PM Medical Record Number: [id number] Patient Account Number: [id number] Date of Birth/Gender: Treating RN: September 22, 1985 (37 y.o. [name] Primary Care Physician: Salvadore [name] Other Clinician: [name] Referring Physician: Treating Physician/Extender: [name] in Treatment: 6 Education Assessment Education Provided To: Patient Education Topics Provided Wound/Skin Impairment: Handouts: Other: continue wound care as directed Methods: Explain/Verbal Responses: State content correctly Electronic Signature(s) Signed: 05/31/2022 11:57:56 AM By: [name] Entered By: [name] on 05/30/2022 16:17:18 -------------------------------------------------------------------------------- Wound Assessment Details Patient Name: Date of Service: Pulido,  SHA MA J. 05/30/2022 3:30 PM Medical Record Number: [id number] Patient Account Number:  [id number] Date of Birth/Sex: Treating RN: Apr 05, 1986 (37 y.o. [name] Primary Care Deamonte Sayegh: Salvadore [name] Other Clinician: [name] Referring Mckensie Scotti: Treating Lynnann Knudsen/Extender: [name] in Treatment: 6 Wound Status Wound Number: 5 Primary Etiology: Venous Leg Ulcer YAFA, BEINE ([id number]) [phone].pdf Page 8 of 9 Wound Location: Left, Medial Lower Leg Wound Status: Open Wounding Event: Gradually Appeared Comorbid History: Hypertension, Peripheral Venous Disease Date Acquired: 04/10/2022 Weeks Of Treatment: 6 Clustered Wound: No Photos Wound Measurements Length: (cm) 1.5 Width: (cm) 3 Depth: (cm) 0.2 Area: (cm) 3.534 Volume: (cm) 0.707 % Reduction in Area: 35.7% % Reduction in Volume: 35.7% Epithelialization: None Wound Description Classification: Full Thickness Without Exposed Support Structures Exudate Amount: Medium Exudate Type: Serosanguineous Exudate Color: red, brown Foul Odor After Cleansing: No Slough/Fibrino Yes Wound Bed Granulation Amount: None Present (0%) Exposed Structure Necrotic Amount: Large (67-100%) Fascia Exposed: No Necrotic Quality: Eschar, Adherent Slough Fat Layer (Subcutaneous Tissue) Exposed: Yes Tendon Exposed: No Muscle Exposed: No Joint Exposed: No Bone Exposed: No Treatment Notes Wound #5 (Lower Leg) Wound Laterality: Left, Medial Cleanser Byram Ancillary Kit - 15 Day Supply Discharge Instruction: Use supplies as instructed; Kit contains: (15) Saline Bullets; (15) 3x3 Gauze; 15 pr Gloves Peri-Wound Care Topical Primary Dressing keystone Secondary Dressing (BORDER) Zetuvit Plus SILICONE BORDER Dressing 5x5 (in/in) Discharge Instruction: Please do not put silicone bordered dressings under wraps. Use non-bordered dressing only. Secured With Tubigrip Size F, 4x10 (in/yd) Discharge Instruction: double layer Apply Tubigrip F 3 finger-widths below knee  to base of toes to secure dressing and/or for swelling. Compression Wrap Compression Stockings Add-Ons Electronic Signature(s) Signed: 05/31/2022 11:57:56 AM By: [name] Signed: 06/01/2022 1:08:07 PM By: Gretta Cool, [name], RN, CWS, [name], [name] Entered By: [name] on 05/30/2022 15:59:20 Hellmer, Herminio Heads ([id number] Page 9 of 9 -------------------------------------------------------------------------------- Vitals Details Patient Name: Date of Service: ALLYSEN, BAIL [place] J. 05/30/2022 3:30 PM Medical Record Number: [id number] Patient Account Number: [id number] Date of Birth/Sex: Treating RN: [birth year] (37 y.o. Charolette Forward, Kim Primary Care Jiselle Sheu: Salvadore [name] Other Clinician: [name] Referring Lynna Zamorano: Treating Aziyah Provencal/Extender: [name] in Treatment: 6 Vital Signs Time Taken: 15:51 Temperature (F): 98.1 Height (in): 66 Pulse (bpm): 98 Weight (lbs): 400 Respiratory Rate (breaths/min): 18 Body Mass Index (BMI): 64.6 Blood Pressure (mmHg): 123/86 Reference Range: 80 - 120 mg / dl Electronic Signature(s) Signed: 05/31/2022 11:57:56 AM By: [name] Entered By: [name] on 05/30/2022 15:53:48

## 2022-06-06 ENCOUNTER — Encounter: Payer: BC Managed Care – PPO | Admitting: Internal Medicine

## 2022-06-13 ENCOUNTER — Encounter (HOSPITAL_BASED_OUTPATIENT_CLINIC_OR_DEPARTMENT_OTHER): Payer: BC Managed Care – PPO | Admitting: Internal Medicine

## 2022-06-13 DIAGNOSIS — L97822 Non-pressure chronic ulcer of other part of left lower leg with fat layer exposed: Secondary | ICD-10-CM | POA: Diagnosis not present

## 2022-06-13 DIAGNOSIS — I87312 Chronic venous hypertension (idiopathic) with ulcer of left lower extremity: Secondary | ICD-10-CM | POA: Diagnosis not present

## 2022-06-18 NOTE — Progress Notes (Signed)
CORREY, KEFAUVER ([id number]) [id number] Page 1 of 10 Visit Report for 06/13/2022 Chief Complaint Document Details Patient Name: Date of Service: [name], [name] [place]. 06/13/2022 3:30 PM Medical Record Number: [id number] Patient Account Number: [id number] Date of Birth/Sex: Treating RN: May 12, 1985 (37 y.o. [name] Primary Care Provider: [name] Other Clinician: [name] Referring Provider: Treating Provider/Extender: [name] in Treatment: 8 Information Obtained from: Patient Chief Complaint 04/18/2022; Left LE Ulcer Electronic Signature(s) Signed: 06/13/2022 4:14:12 PM By: Kalman Shan DO Entered By: Kalman Shan on 06/13/2022 16:12:00 -------------------------------------------------------------------------------- Debridement Details Patient Name: Date of Service: [name] Corporal [place] J. 06/13/2022 3:30 PM Medical Record Number: [id number] Patient Account Number: [id number] Date of Birth/Sex: Treating RN: 1985-09-17 (37 y.o. Charolette Forward, Kim Primary Care Provider: [name] Other Clinician: [name] Referring Provider: Treating Provider/Extender: [name] in Treatment: 8 Debridement Performed for Assessment: Wound #5 Left,Medial Lower Leg Performed By: Physician Kalman Shan, MD Debridement Type: Debridement Severity of Tissue Pre Debridement: Fat layer exposed Level of Consciousness (Pre-procedure): Awake and Alert Pre-procedure Verification/Time Out Yes - 16:01 Taken: Start Time: 16:01 T Area Debrided (L x W): otal 0.5 (cm) x 0.5 (cm) = 0.25 (cm) Tissue and other material debrided: Viable, Non-Viable, Slough, Subcutaneous, Slough Level: Skin/Subcutaneous Tissue Debridement Description: Excisional Instrument: Curette Bleeding: Minimum Hemostasis Achieved: Pressure Response to Treatment: Procedure was tolerated well Level of Consciousness (Post- Awake  and Alert procedure): [name], [name] ([id number]) [id number] Page 2 of 10 Post Debridement Measurements of Total Wound Length: (cm) 1.5 Width: (cm) 3 Depth: (cm) 0.1 Volume: (cm) 0.353 Character of Wound/Ulcer Post Debridement: Stable Severity of Tissue Post Debridement: Fat layer exposed Post Procedure Diagnosis Same as Pre-procedure Electronic Signature(s) Signed: 06/13/2022 4:14:12 PM By: Kalman Shan DO Signed: 06/14/2022 6:12:13 PM By: Gretta Cool, [name], RN, CWS, [name], [name] Signed: 06/15/2022 1:35:31 PM By: [name] Entered By: [name] on 06/13/2022 16:02:41 -------------------------------------------------------------------------------- HPI Details Patient Name: Date of Service: [name] Corporal MA J. 06/13/2022 3:30 PM Medical Record Number: [id number] Patient Account Number: [id number] Date of Birth/Sex: Treating RN: 10/13/1985 (37 y.o. [name] Primary Care Provider: [name] Other Clinician: [name] Referring Provider: Treating Provider/Extender: [name] in Treatment: 8 History of Present Illness HPI Description: 37 year old patient who started with having ulcerations on the right lower leg on the lateral part of her ankle for about 2 weeks. She was seen in the ER at [facility] and advised to see the wound care for a consultation. No X-rays of workup was done during the ER visit and no prescription for any medications of compression wraps were given. the patient is not diabetic but does have hypertension and her medications have been reviewed by me. 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 The right and left greater saphenous vein was found to be tortuous. Deep venous system was also not competent and there was reflux of  more than 500 ms. She was then seen by Dr. Darene Lamer Early who recommended that the patient would not benefit from endovenous ablation and he had recommended vein stripping odd on the right side and multiple small phlebectomy procedures on the left side. the patient did not follow-up due to social economic reasons. She has not been wearing any compression stockings and has not taken any  specific treatment for varicose veins for the last 3 years. 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 We have obtained some appointments to see the vascular surgeons by the end of the month and the patient would like to follow up with me at my [facility] on Wednesday, June 29. 10/14/2014 -- she could not see me yesterday in [place] and hence has come for a review today. She has a vascular workup to be done this afternoon at [facility]. She is doing fine otherwise. 10/22/2014 -- she was seen by Dr. Althea Charon and he has recommended surgical removal of her right saphenous vein from distal thigh to saphenofemoral odd junction and stab phlebectomy's of multiple large tributary branches throughout her thigh and calf. This would be done under general anesthesia in the outpatient setting. 10/29/2014 -- she is trying to work on a surgical date and in the meanwhile we have got insurance clearance for Apligraf and we will start this next week. 7/22 2016 -- she is here for the first application of Apligraf. 11/19/2014 -- she is here for a second application of Apligraf 11/26/2014 -- she has done fine after her last application of Apligraf and is awaiting her surgery which is scheduled for August 31. 12/03/2014 -- she is doing fine and is here for her third application of Apligraf. 12/21/2014 -- She had surgery on 12/15/2014 by Dr.Early who did #1 ligation and stripping of right great saphenous vein from distal thigh to  saphenofemoral junction, #2 stab phlebectomy of large tributary varicose veins in the thigh popliteal space and calf. She had an Ace wrap up to her groin and this was removed today and the Unna's boot was also removed. 12/28/2014 -- she is here for her fourth application of Apligraf. 01/06/2015 - he saw her vascular surgeon Dr. Donnetta Hutching who was pleased with her progress and he has confirmed that no surgical procedures could be attempted on the left side. 01/13/2015 -- her wound looks very good and she's been having no problems whatsoever. Readmission: [name], [name] ([id number]) [id number] Page 3 of 10 07/26/2020 upon evaluation today patient presents for initial inspection here in our clinic for a new issue with her left leg although she is previously been seen due to issues with the right leg back in 2016. At that time she was seeing Dr. Donnetta Hutching who is a vein/vascular specialist in [place]. He has since semiretired from what I understand. He is working out of [employer] I believe. Nonetheless she tells me at the time that there was really nothing to do for her left leg although the right leg was where they did most of the work. Subsequently she states that she is done fairly well until just in the past week where she had issues with bleeding from what appears to be varicose vein on the left leg medially. Unfortunately this has continued to be an issue although she tells me at first it was coming much more significantly Down quite a bit but nonetheless has not completely resolved. Every time she showers she notices that it starts to drain a little bit more. She does have a history of chronic venous insufficiency, lymphedema, varicose veins bilaterally, and obesity. 08/02/2020 upon evaluation today patient appears to be doing about the same in regards to the ulcer on her left leg. She has some eschar covering there is definitely some fluid collecting underneath  unfortunately. With  that being said she tells me she is still having a tremendous amount of pain therefore she is really not able to allow me to clean this off very effectively to be perfectly honest. I think we need to try to soften this up 08/16/2020 upon evaluation today patient's wound is really not doing significantly better not really states about the same. She notes that the wrap just does not seem to be staying up very well at all unfortunately. No fevers, chills, nausea, vomiting, or diarrhea. She did cut it off once it starts to slide in order to alleviate some of the pressure from sliding Down. Fortunately there is no signs of active infection at this time which is great news. 08/23/2020 upon evaluation today patient appears to be doing well 08/23/2020 upon evaluation today patient appears to be doing well with regard to her wound all things considered. Fortunately there does not appear to be any signs of active infection at this time which is great news. She has been tolerating the dressing changes without complication and overall I am extremely pleased with where things stand at this point. She does have her appointment with vascular in [facility] on June 9. 08/30/2020 upon evaluation today patient actually appears to be doing decently well in regard to her wound. Fortunately there is no signs of active infection which is great news. Nonetheless I do believe that the patient is going require little bit of debridement if she is okay with me attempting that today I think that will help clean off some of the necrotic tissue. Fortunately there does not appear to be otherwise any evidence of active infection which is also great news. 09/19/2020 upon evaluation today patient appears to be doing a little better in regard to her wound as compared to previous. Fortunately there does not appear to be any signs of active infection overall. No fever chills noted. I do believe that the Iodosorb has made this a  little bit better with regard to the overall size and appearance of the wound bed though again she does still have quite a ways to go to get this to heal she still very tender to touch. 09/27/2020 upon evaluation today patient appears to actually be doing quite well with regard to her wound. This is measuring much smaller which is great news. With that being said she did see vein and vascular in [facility] and they subsequently recommended that surgery is really what she probably needs to go forward with sounds like the potential for venous ablation. With that being said the patient tells me this is just not the right time for her to be able to proceed with any type of surgery which I completely understand. Nonetheless I do believe that she would continue to benefit from compression but again that is really not something that she is able to easily do. 10/04/2020 upon evaluation today patient appears to be doing about the same in regard to her wound. This is measuring a little bit smaller but nonetheless still is open and again has some slough and biofilm noted on the surface of the wound. There does not appear to be any signs of active infection which is great news. No fevers, chills, nausea, vomiting, or diarrhea. 10/04/2020 upon evaluation today patient appears to be doing well with regard to her wound. She has been tolerating dressing changes without complication. Fortunately there does not appear to be any signs of active infection which is great news. No fevers, chills, nausea, vomiting, or  diarrhea. 10/25/2020 upon evaluation today patient appears to be doing well with regard to her wound. She has been tolerating the dressing changes without complication. Fortunately there is no signs of active infection at this time. No fevers, chills, nausea, vomiting, or diarrhea. 11/01/2020 upon evaluation today patient with regard to her wound. She has been tolerating the dressing changes without complication.  Fortunately there does not appear to be any signs of infection which is great news. No fever chills noted 11/15/2020 upon evaluation today patient appears to be doing well with regard to her wound. Fortunately there is no signs of active infection at this time. No fevers, chills, nausea, vomiting, or diarrhea. With that being said she continues to have a significant amount of pain at the site even though this is very close to complete closure. She also had several varicose veins around the area which were also problematic. Overall however I feel like the patient is making excellent progress. 11/28/2020 upon evaluation today patient appears to be doing well with regard to her leg ulcer. Again were not really able to debride or compression wrap her due to discomfort and pain. She does not allow for that. With that being said we have been using Iodosorb which does seem to be doing decently well. Fortunately there is no signs of active infection at this time which is great news. No fevers, chills, nausea, vomiting, or diarrhea. 8/31; patient presents for 2-week follow-up. She has been using Iodosorb. She reports that the wound is closed. She denies signs of infection. Readmission: 10-27-2021 upon evaluation this is a patient that presents today whom I have previously seen this is pretty much for the same issue though I think a little bit higher than the last time I saw her. She does have a history of chronic venous insufficiency and hypertension along with varicose veins. Subsequently she does have an ulceration which spontaneously ruptured she has not been wearing any compression which I think is a big part of the issue here. We discussed this before I really think she probably needs to be wearing her compression therapy, she probably needs lymphedema pumps if she can ever wear the compression for a significant amount of time to get these, and subsequently also think that she needs to be elevating her legs  is much as possible she may even need some vascular intervention in regard to her veins. All of this was reiterated and discussed with her today to reinforce what needs to happen in order to ensure that her legs do not get a lot worse. The patient voiced understanding. She tells me that she knows because she is seeing her mom go through a lot of this as well how bad things can get. 11-03-2021 upon evaluation today patient appears to be doing well with regard to her wound. Fortunately there does not appear to be any signs of active infection at this time. She is measuring a little bit bigger but I think this is because the wound is actually cleaning up a bit here. 7/27; left lateral leg wound not any smaller but perhaps with a cleaner surface. She is using Iodoflex to help with the latter and using Tubigrip. She has chronic venous insufficiency with secondary lymphedema. She is apparently followed by vein and vascular and is being scheduled for an ablation 11-17-2021 upon evaluation today patient appears to be doing well with regard to her wound this is actually showing signs of improvement which is great news. Fortunately I do not see  any evidence of active infection locally or systemically at this time which is great news. No fevers, chills, nausea, vomiting, or diarrhea. 11-24-2021 upon evaluation today patient's wound is actually showing signs of significant improvement. Unfortunately she had quite a bit of pain with debridement last week I do believe it was beneficial but at the same time she is doing much better but still really does not want this debrided again I think being that it is looking a whole lot better I would try to avoid that today especially since it caused her so much discomfort that is really not the goal and I explained that to the patient today she voiced understanding and knows that it needed to be done but still states that it was quite painful. 11-30-2021 upon evaluation today  patient appears to be doing excellent in regard to her wound this is actually showing signs of excellent improvement I am very pleased with where things stand. She does have her venous ablation appointment for October 17. 12-21-2021 upon evaluation today patient appears to be doing better in regard to her wound this is measuring smaller and looking better as well. Fortunately I do not see any signs of active infection locally or systemically which is great news. [name], [name] ([id number]) [id number] Page 4 of 10 01-01-2022 upon evaluation today patient appears to be doing well currently in regard to her wound. She is showing signs of improvement which is great news and overall I do not see any signs of active infection locally or systemically at this time. 01-08-2022 upon evaluation today patient appears to be doing well currently in regard to her wound she is actually showing signs of significant improvement which is great news. Fortunately I do not see any evidence of active infection locally or systemically at this time. I do believe that we are on the right track. She also has her appointment October 19 for the venous ablation. 01-16-2022 upon evaluation today patient's wound actually is showing some signs of improvement although this is very slow. Fortunately I do not see any evidence of infection at this time. The volume is a little bit more although the size is smaller I think this is due to the fact that we are slowly cleaning this area out effectively. 11/6 continued improvement the patient is using Iodoflex and a Tubigrip E. She was supposed to have venous surgery at vein and vascular in [place] however somehow this is gotten delayed till December 14. 02-27-2022 upon evaluation today patient's wound is actually showing signs of being completely healed. Fortunately I do not see any evidence of active infection locally or systemically which is great news and  overall I am extremely pleased with where we stand today. Unfortunately she does tell me that she postpone her venous ablation surgery until December 14 she tells me that she was not ready "financially" for this. 04/18/2022; Ms. Leler Draper is a 37 year old female with a past medical history of venous insufficiency that presents the clinic for a left lower extremity wound. She was seen almost 2 months ago for the same wound. This had healed with Iodoflex and Tubigrip. She states that the wound recently reopened. She has seen vein and vascular and plan is for ligation and stripping of the left great saphenous vein. She is not sure when this procedure is going to be scheduled. She has canceled it once before. She has had office compression wraps in the past however these do not stay on and create more of  an issue for her. She would like to avoid this. She currently denies signs of infection. 1/10; patient presents for follow-up. She has been using Iodoflex to the wound bed. She states she has been using her Tubigrip however she does not have this on today. She has no issues or complaints today. She denies signs of infection. 1/17; patient presents for follow-up. She has not been using Iodoflex to the wound bed. She reports acute pain. She denies increased warmth, erythema or purulent drainage to the left lower extremity. She states she has been using Tubigrip. She has information to order the compression stockings but has not obtained them. 1/24; Patient had a wound culture done at last clinic visit that grew Proteus mirabilis and E. coli. She was prescribed levofloxacin and this should cover both bacteria. She has been taking the medication over the past week with improvement of symptoms to the wound bed. She has been using Hydrofera Blue under Tubigrip. She states the [facility] is sticking to her wound bed. 1/31; patient presents for follow-up. She has been using Medihoney to the wound bed with  improvement in healing. She has been contacted by [facility] to order her antibiotic ointment. She is not sure yet if she is able to afford this. She received her compression stockings but states they did not fit well. She is working on returning these. She has been using Tubigrip. 2/7; patient presents for follow-up. She is been using Medihoney to the wound bed. She obtained her compression stockings and has been using these daily. She states that the [facility] antibiotic ointment is arriving in the mail tomorrow. She has no issues or complaints today. She denies systemic signs of infection. 2/14; patient presents for follow-up. She has been using Keystone antibiotic ointment to the wound bed. She states the [facility] is sticking and she has stopped this. She is been using her compression stockings daily. She does not have these on today. 2/28; patient presents for follow-up. She continues to use [facility] antibiotic ointment to the wound bed. There is been improvement in healing. She has been using her compression stockings daily. Electronic Signature(s) Signed: 06/13/2022 4:14:12 PM By: Kalman Shan DO Entered By: Kalman Shan on 06/13/2022 16:12:23 -------------------------------------------------------------------------------- Physical Exam Details Patient Name: Date of Service: [name] Corporal [place] 06/13/2022 3:30 PM Medical Record Number: [id number] Patient Account Number: [id number] Date of Birth/Sex: Treating RN: 17-Apr-1985 (37 y.o. [name] Primary Care Provider: [name] Other Clinician: [name] Referring Provider: Treating Provider/Extender: [name] in Treatment: 8 Constitutional . Cardiovascular . Psychiatric TIMIYA, GERGEL ([id number]) [id number] Page 5 of 10 . Notes Left lower extremity: T the distal medial aspect there is an open wound with granulation tissue and nonviable tissue. No  increased warmth, erythema or o purulent drainage. Electronic Signature(s) Signed: 06/13/2022 4:14:12 PM By: Kalman Shan DO Entered By: Kalman Shan on 06/13/2022 16:12:41 -------------------------------------------------------------------------------- Physician Orders Details Patient Name: Date of Service: [place], [place] 06/13/2022 3:30 PM Medical Record Number: [id number] Patient Account Number: [id number] Date of Birth/Sex: Treating RN: 09/12/1985 (37 y.o. [name] Primary Care Provider: [name] Other Clinician: [name] Referring Provider: Treating Provider/Extender: [name] in Treatment: 8 Verbal / Phone Orders: No Diagnosis Coding Follow-up Appointments Return Appointment in 1 week. Bathing/ Shower/ Hygiene May shower; gently cleanse wound with antibacterial soap, rinse and pat dry prior to dressing wounds Anesthetic (Use 'Patient Medications' Section for Anesthetic Order Entry) Lidocaine applied to wound  bed Edema Control - Lymphedema / Segmental Compressive Device / Other Elevate, Exercise Daily and A void Standing for Long Periods of Time. Elevate legs to the level of the heart and pump ankles as often as possible Elevate leg(s) parallel to the floor when sitting. Medications-Please add to medication list. Keystone Compound - keystone gel Wound Treatment Wound #5 - Lower Leg Wound Laterality: Left, Medial Cleanser: Byram Ancillary Kit - 15 Day Supply (Generic) 1 x Per Day/30 Days Discharge Instructions: Use supplies as instructed; Kit contains: (15) Saline Bullets; (15) 3x3 Gauze; 15 pr Gloves Prim Dressing: keystone ary 1 x Per Day/30 Days Secondary Dressing: (BORDER) Zetuvit Plus SILICONE BORDER Dressing 5x5 (in/in) (DME) (Dispense As Written) 1 x Per Day/30 Days Discharge Instructions: Please do not put silicone bordered dressings under wraps. Use non-bordered dressing only. Secured With: Tubigrip Size  F, 4x10 (in/yd) (Generic) 1 x Per Day/30 Days Discharge Instructions: double layer Apply Tubigrip F 3 finger-widths below knee to base of toes to secure dressing and/or for swelling. Electronic Signature(s) Signed: 06/13/2022 4:14:12 PM By: Kalman Shan DO Entered By: Kalman Shan on 06/13/2022 16:13:33 Parkison, Herminio Heads ([id number] Page 6 of 10 -------------------------------------------------------------------------------- Problem List Details Patient Name: Date of Service: YVONNIE, BARBIE [place]. 06/13/2022 3:30 PM Medical Record Number: [id number] Patient Account Number: [id number] Date of Birth/Sex: Treating RN: 02/23/1986 (37 y.o. 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 06/13/2022 3:30 PM Medical Record Number: [id number] Patient Account Number: [id number] Date of Birth/Sex: Treating RN: Jun 05, 1985 (37 y.o. Charolette Forward, Kim Primary Care Provider: [name] Other Clinician: [name] Referring Provider: Treating Provider/Extender: [name] in Treatment: 8 Subjective Chief Complaint Information  obtained from Patient [name], [name] ([id number]) [id number] Page 7 of 10 04/18/2022; Left LE Ulcer History of Present Illness (HPI) 37 year old patient who started with having ulcerations on the right lower leg on the lateral part of her ankle for about 2 weeks. She was seen in the ER at [facility] ([place]) and advised to see the wound care for a consultation. No X-rays of workup was done during the ER visit and no prescription for any medications of compression wraps were given. the patient is not diabetic but does have hypertension and her medications have been reviewed by me. 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 The right and left greater saphenous vein was found to be tortuous. Deep venous system was also not competent and there was reflux of more than 500 ms. She was then seen by Dr. Darene Lamer Early who recommended that the patient would not benefit from endovenous ablation and he had recommended vein stripping odd on the right side and multiple small phlebectomy procedures on the left side. the patient did not follow-up due  to social economic reasons. She has not been wearing any compression stockings and has not taken any specific treatment for varicose veins for the last 3 years. 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 We have obtained some appointments to see the vascular surgeons by the end of the month and the patient would like to follow up with me at my [facility] on Wednesday, June 29. 10/14/2014 -- she could not see me yesterday in [place] and hence has come for a review today. She has a vascular workup to be done this afternoon at [facility]. She is doing fine otherwise. 10/22/2014  -- she was seen by Dr. Althea Charon and he has recommended surgical removal of her right saphenous vein from distal thigh to saphenofemoral odd junction and stab phlebectomy's of multiple large tributary branches throughout her thigh and calf. This would be done under general anesthesia in the outpatient setting. 10/29/2014 -- she is trying to work on a surgical date and in the meanwhile we have got insurance clearance for Apligraf and we will start this next week. 7/22 2016 -- she is here for the first application of Apligraf. 11/19/2014 -- she is here for a second application of Apligraf 11/26/2014 -- she has done fine after her last application of Apligraf and is awaiting her surgery which is scheduled for August 31. 12/03/2014 -- she is doing fine and is here for her third application of Apligraf. 12/21/2014 -- She had surgery on 12/15/2014 by Dr.Early who did #1 ligation and stripping of right great saphenous vein from distal thigh to saphenofemoral junction, #2 stab phlebectomy of large tributary varicose veins in the thigh popliteal space and calf. She had an Ace wrap up to her groin and this was removed today and the Unna's boot was also removed. 12/28/2014 -- she is here for her fourth application of Apligraf. 01/06/2015 - he saw her vascular surgeon Dr. Donnetta Hutching who was pleased with her progress and he has confirmed that no surgical procedures could be attempted on the left side. 01/13/2015 -- her wound looks very good and she's been having no problems whatsoever. Readmission: 07/26/2020 upon evaluation today patient presents for initial inspection here in our clinic for a new issue with her left leg although she is previously been seen due to issues with the right leg back in 2016. At that time she was seeing Dr. Donnetta Hutching who is a vein/vascular specialist in [place]. He has since semiretired from what I understand. He is working out of [employer] I believe. Nonetheless she tells me at the time  that there was really nothing to do for her left leg although the right leg was where they did most of the work. Subsequently she states that she is done fairly well until just in the past week where she had issues with bleeding from what appears to be varicose vein on the left leg medially. Unfortunately this has continued to be an issue although she tells me at first it was coming much more significantly Down quite a bit but nonetheless has not completely resolved. Every time she showers she notices that it starts to drain a little bit more. She does have a history of chronic venous insufficiency, lymphedema, varicose veins bilaterally, and obesity. 08/02/2020 upon evaluation today patient appears to be doing about the same in regards to the ulcer on her left leg. She has some eschar covering there  is definitely some fluid collecting underneath unfortunately. With that being said she tells me she is still having a tremendous amount of pain therefore she is really not able to allow me to clean this off very effectively to be perfectly honest. I think we need to try to soften this up 08/16/2020 upon evaluation today patient's wound is really not doing significantly better not really states about the same. She notes that the wrap just does not seem to be staying up very well at all unfortunately. No fevers, chills, nausea, vomiting, or diarrhea. She did cut it off once it starts to slide in order to alleviate some of the pressure from sliding Down. Fortunately there is no signs of active infection at this time which is great news. 08/23/2020 upon evaluation today patient appears to be doing well 08/23/2020 upon evaluation today patient appears to be doing well with regard to her wound all things considered. Fortunately there does not appear to be any signs of active infection at this time which is great news. She has been tolerating the dressing changes without complication and overall I am extremely pleased  with where things stand at this point. She does have her appointment with vascular in [facility] on June 9. 08/30/2020 upon evaluation today patient actually appears to be doing decently well in regard to her wound. Fortunately there is no signs of active infection which is great news. Nonetheless I do believe that the patient is going require little bit of debridement if she is okay with me attempting that today I think that will help clean off some of the necrotic tissue. Fortunately there does not appear to be otherwise any evidence of active infection which is also great news. 09/19/2020 upon evaluation today patient appears to be doing a little better in regard to her wound as compared to previous. Fortunately there does not appear to be any signs of active infection overall. No fever chills noted. I do believe that the Iodosorb has made this a little bit better with regard to the overall size and appearance of the wound bed though again she does still have quite a ways to go to get this to heal she still very tender to touch. 09/27/2020 upon evaluation today patient appears to actually be doing quite well with regard to her wound. This is measuring much smaller which is great news. With that being said she did see vein and vascular in [facility] and they subsequently recommended that surgery is really what she probably needs to go forward with sounds like the potential for venous ablation. With that being said the patient tells me this is just not the right time for her to be able to proceed with any type of surgery which I completely understand. Nonetheless I do believe that she would continue to benefit from compression but again that is really not something that she is able to easily do. 10/04/2020 upon evaluation today patient appears to be doing about the same in regard to her wound. This is measuring a little bit smaller but nonetheless still is open and again has some slough and biofilm noted  on the surface of the wound. There does not appear to be any signs of active infection which is great news. No fevers, chills, nausea, vomiting, or diarrhea. 10/04/2020 upon evaluation today patient appears to be doing well with regard to her wound. She has been tolerating dressing changes without complication. Fortunately there does not appear to be any signs of active infection which  is great news. No fevers, chills, nausea, vomiting, or diarrhea. 10/25/2020 upon evaluation today patient appears to be doing well with regard to her wound. She has been tolerating the dressing changes without complication. Fortunately there is no signs of active infection at this time. No fevers, chills, nausea, vomiting, or diarrhea. 11/01/2020 upon evaluation today patient with regard to her wound. She has been tolerating the dressing changes without complication. Fortunately there does not appear to be any signs of infection which is great news. No fever chills noted 11/15/2020 upon evaluation today patient appears to be doing well with regard to her wound. Fortunately there is no signs of active infection at this time. No CAMELIA, SUDHOFF ([id number]) [id number] Page 8 of 10 fevers, chills, nausea, vomiting, or diarrhea. With that being said she continues to have a significant amount of pain at the site even though this is very close to complete closure. She also had several varicose veins around the area which were also problematic. Overall however I feel like the patient is making excellent progress. 11/28/2020 upon evaluation today patient appears to be doing well with regard to her leg ulcer. Again were not really able to debride or compression wrap her due to discomfort and pain. She does not allow for that. With that being said we have been using Iodosorb which does seem to be doing decently well. Fortunately there is no signs of active infection at this time which is great news. No  fevers, chills, nausea, vomiting, or diarrhea. 8/31; patient presents for 2-week follow-up. She has been using Iodosorb. She reports that the wound is closed. She denies signs of infection. Readmission: 10-27-2021 upon evaluation this is a patient that presents today whom I have previously seen this is pretty much for the same issue though I think a little bit higher than the last time I saw her. She does have a history of chronic venous insufficiency and hypertension along with varicose veins. Subsequently she does have an ulceration which spontaneously ruptured she has not been wearing any compression which I think is a big part of the issue here. We discussed this before I really think she probably needs to be wearing her compression therapy, she probably needs lymphedema pumps if she can ever wear the compression for a significant amount of time to get these, and subsequently also think that she needs to be elevating her legs is much as possible she may even need some vascular intervention in regard to her veins. All of this was reiterated and discussed with her today to reinforce what needs to happen in order to ensure that her legs do not get a lot worse. The patient voiced understanding. She tells me that she knows because she is seeing her mom go through a lot of this as well how bad things can get. 11-03-2021 upon evaluation today patient appears to be doing well with regard to her wound. Fortunately there does not appear to be any signs of active infection at this time. She is measuring a little bit bigger but I think this is because the wound is actually cleaning up a bit here. 7/27; left lateral leg wound not any smaller but perhaps with a cleaner surface. She is using Iodoflex to help with the latter and using Tubigrip. She has chronic venous insufficiency with secondary lymphedema. She is apparently followed by vein and vascular and is being scheduled for an ablation 11-17-2021 upon  evaluation today patient appears to be doing well with regard to  her wound this is actually showing signs of improvement which is great news. Fortunately I do not see any evidence of active infection locally or systemically at this time which is great news. No fevers, chills, nausea, vomiting, or diarrhea. 11-24-2021 upon evaluation today patient's wound is actually showing signs of significant improvement. Unfortunately she had quite a bit of pain with debridement last week I do believe it was beneficial but at the same time she is doing much better but still really does not want this debrided again I think being that it is looking a whole lot better I would try to avoid that today especially since it caused her so much discomfort that is really not the goal and I explained that to the patient today she voiced understanding and knows that it needed to be done but still states that it was quite painful. 11-30-2021 upon evaluation today patient appears to be doing excellent in regard to her wound this is actually showing signs of excellent improvement I am very pleased with where things stand. She does have her venous ablation appointment for October 17. 12-21-2021 upon evaluation today patient appears to be doing better in regard to her wound this is measuring smaller and looking better as well. Fortunately I do not see any signs of active infection locally or systemically which is great news. 01-01-2022 upon evaluation today patient appears to be doing well currently in regard to her wound. She is showing signs of improvement which is great news and overall I do not see any signs of active infection locally or systemically at this time. 01-08-2022 upon evaluation today patient appears to be doing well currently in regard to her wound she is actually showing signs of significant improvement which is great news. Fortunately I do not see any evidence of active infection locally or systemically at this time. I  do believe that we are on the right track. She also has her appointment October 19 for the venous ablation. 01-16-2022 upon evaluation today patient's wound actually is showing some signs of improvement although this is very slow. Fortunately I do not see any evidence of infection at this time. The volume is a little bit more although the size is smaller I think this is due to the fact that we are slowly cleaning this area out effectively. 11/6 continued improvement the patient is using Iodoflex and a Tubigrip E. She was supposed to have venous surgery at vein and vascular in [place] however somehow this is gotten delayed till December 14. 02-27-2022 upon evaluation today patient's wound is actually showing signs of being completely healed. Fortunately I do not see any evidence of active infection locally or systemically which is great news and overall I am extremely pleased with where we stand today. Unfortunately she does tell me that she postpone her venous ablation surgery until December 14 she tells me that she was not ready "financially" for this. 04/18/2022; Ms. Arieyanna Bushey is a 37 year old female with a past medical history of venous insufficiency that presents the clinic for a left lower extremity wound. She was seen almost 2 months ago for the same wound. This had healed with Iodoflex and Tubigrip. She states that the wound recently reopened. She has seen vein and vascular and plan is for ligation and stripping of the left great saphenous vein. She is not sure when this procedure is going to be scheduled. She has canceled it once before. She has had office compression wraps in the past however these  do not stay on and create more of an issue for her. She would like to avoid this. She currently denies signs of infection. 1/10; patient presents for follow-up. She has been using Iodoflex to the wound bed. She states she has been using her Tubigrip however she does not have this on today. She  has no issues or complaints today. She denies signs of infection. 1/17; patient presents for follow-up. She has not been using Iodoflex to the wound bed. She reports acute pain. She denies increased warmth, erythema or purulent drainage to the left lower extremity. She states she has been using Tubigrip. She has information to order the compression stockings but has not obtained them. 1/24; Patient had a wound culture done at last clinic visit that grew Proteus mirabilis and E. coli. She was prescribed levofloxacin and this should cover both bacteria. She has been taking the medication over the past week with improvement of symptoms to the wound bed. She has been using Hydrofera Blue under Tubigrip. She states the [facility] is sticking to her wound bed. 1/31; patient presents for follow-up. She has been using Medihoney to the wound bed with improvement in healing. She has been contacted by [facility] to order her antibiotic ointment. She is not sure yet if she is able to afford this. She received her compression stockings but states they did not fit well. She is working on returning these. She has been using Tubigrip. 2/7; patient presents for follow-up. She is been using Medihoney to the wound bed. She obtained her compression stockings and has been using these daily. She states that the [facility] antibiotic ointment is arriving in the mail tomorrow. She has no issues or complaints today. She denies systemic signs of infection. 2/14; patient presents for follow-up. She has been using Keystone antibiotic ointment to the wound bed. She states the [facility] is sticking and she has stopped this. She is been using her compression stockings daily. She does not have these on today. 2/28; patient presents for follow-up. She continues to use [facility] antibiotic ointment to the wound bed. There is been improvement in healing. She has been using her compression stockings daily. [name], [name]  ([id number]) [id number] Page 9 of 10 Objective Constitutional Vitals Time Taken: 3:47 PM, Height: 66 in, Weight: 400 lbs, BMI: 64.6, Temperature: 98.5 F, Pulse: 94 bpm, Respiratory Rate: 18 breaths/min, Blood Pressure: 116/70 mmHg. General Notes: Left lower extremity: T the distal medial aspect there is an open wound with granulation tissue and nonviable tissue. No increased warmth, o erythema or purulent drainage. Integumentary (Hair, Skin) Wound #5 status is Open. Original cause of wound was Gradually Appeared. The date acquired was: 04/10/2022. The wound has been in treatment 8 weeks. The wound is located on the Left,Medial Lower Leg. The wound measures 1.5cm length x 3cm width x 0.1cm depth; 3.534cm^2 area and 0.353cm^3 volume. There is Fat Layer (Subcutaneous Tissue) exposed. There is a medium amount of serosanguineous drainage noted. There is no granulation within the wound bed. There is a large (67-100%) amount of necrotic tissue within the wound bed including Eschar and Adherent Slough. Assessment Active Problems ICD-10 Chronic venous hypertension (idiopathic) with ulcer of left lower extremity Non-pressure chronic ulcer of other part of left lower leg with fat layer exposed Lymphedema, not elsewhere classified Patient's wound appears well-healing. I recommended continuing the course with [facility] antibiotic ointment and compression stockings daily. Follow-up in 1 week. Procedures Wound #5 Pre-procedure diagnosis of Wound #5 is a Venous Leg  Ulcer located on the Left,Medial Lower Leg .Severity of Tissue Pre Debridement is: Fat layer exposed. There was a Excisional Skin/Subcutaneous Tissue Debridement with a total area of 0.25 sq cm performed by Kalman Shan, MD. With the following instrument(s): Curette to remove Viable and Non-Viable tissue/material. Material removed includes Subcutaneous Tissue and Slough and. A time out was conducted at 16:01,  prior to the start of the procedure. A Minimum amount of bleeding was controlled with Pressure. The procedure was tolerated well. Post Debridement Measurements: 1.5cm length x 3cm width x 0.1cm depth; 0.353cm^3 volume. Character of Wound/Ulcer Post Debridement is stable. Severity of Tissue Post Debridement is: Fat layer exposed. Post procedure Diagnosis Wound #5: Same as Pre-Procedure Plan Follow-up Appointments: Return Appointment in 1 week. Bathing/ Shower/ Hygiene: May shower; gently cleanse wound with antibacterial soap, rinse and pat dry prior to dressing wounds Anesthetic (Use 'Patient Medications' Section for Anesthetic Order Entry): Lidocaine applied to wound bed Edema Control - Lymphedema / Segmental Compressive Device / Other: Elevate, Exercise Daily and Avoid Standing for Long Periods of Time. Elevate legs to the level of the heart and pump ankles as often as possible Elevate leg(s) parallel to the floor when sitting. Medications-Please add to medication list.: Keystone Compound - keystone gel WOUND #5: - Lower Leg Wound Laterality: Left, Medial Cleanser: Byram Ancillary Kit - 15 Day Supply (Generic) 1 x Per Day/30 Days Discharge Instructions: Use supplies as instructed; Kit contains: (15) Saline Bullets; (15) 3x3 Gauze; 15 pr Gloves Prim Dressing: keystone 1 x Per Day/30 Days ary Secondary Dressing: (BORDER) Zetuvit Plus SILICONE BORDER Dressing 5x5 (in/in) (DME) (Dispense As Written) 1 x Per Day/30 Days Discharge Instructions: Please do not put silicone bordered dressings under wraps. Use non-bordered dressing only. Secured With: Tubigrip Size F, 4x10 (in/yd) (Generic) 1 x Per Day/30 Days Discharge Instructions: double layer Apply Tubigrip F 3 finger-widths below knee to base of toes to secure dressing and/or for swelling. [name], [name] ([id number]) [id number] Page 10 of 10 1. In office sharp debridement 2. Keystone antibiotic ointment 3.  Compression stockings daily Electronic Signature(s) Signed: 06/13/2022 4:14:12 PM By: Kalman Shan DO Entered By: Kalman Shan on 06/13/2022 16:13:11 -------------------------------------------------------------------------------- SuperBill Details Patient Name: Date of Service: [name] Corporal MA J. 06/13/2022 Medical Record Number: [id number] Patient Account Number: [id number] Date of Birth/Sex: Treating RN: 1985-04-28 (37 y.o. Charolette Forward, Kim Primary Care Provider: [name] Other Clinician: [name] Referring Provider: Treating Provider/Extender: [name] in Treatment: 8 Diagnosis Coding ICD-10 Codes Code Description [phone] Chronic venous hypertension (idiopathic) with ulcer of left lower extremity L97.822 Non-pressure chronic ulcer of other part of left lower leg with fat layer exposed I89.0 Lymphedema, not elsewhere classified Facility Procedures : CPT4 Code: [id number] Description: [id number] - DEB SUBQ TISSUE 20 SQ CM/< ICD-10 Diagnosis Description I87.312 Chronic venous hypertension (idiopathic) with ulcer of left lower extremity L97.822 Non-pressure chronic ulcer of other part of left lower leg with fat layer expo Modifier: sed Quantity: 1 Physician Procedures : CPT4 Code Description Modifier [id number] - WC PHYS SUBQ TISS 20 SQ CM ICD-10 Diagnosis Description I87.312 Chronic venous hypertension (idiopathic) with ulcer of left lower extremity L97.822 Non-pressure chronic ulcer of other part of left lower  leg with fat layer exposed Quantity: 1 Electronic Signature(s) Signed: 06/13/2022 4:14:12 PM By: Kalman Shan DO Entered By: Kalman Shan on 06/13/2022 16:13:25

## 2022-06-18 NOTE — Progress Notes (Signed)
ISHA, [name] ([id number]) [id number] Page 1 of 9 Visit Report for 06/13/2022 Arrival Information Details Patient Name: Date of Service: [name], [name] [place]. 06/13/2022 3:30 PM Medical Record Number: [id number] Patient Account Number: [id number] Date of Birth/Sex: Treating RN: 02/12/1986 (37 y.o. [name] Primary Care Shahiem Bedwell: [name] Other Clinician: [name] Referring Letty Salvi: Treating Jamaira Sherk/Extender: [name] in Treatment: 8 Visit Information History Since Last Visit All ordered tests and consults were completed: No Patient Arrived: Ambulatory Added or deleted any medications: No Arrival Time: 15:43 Any new allergies or adverse reactions: No Transfer Assistance: None Had a fall or experienced change in No Patient Identification Verified: Yes activities of daily living that may affect Secondary Verification Process Completed: Yes risk of falls: Patient Requires Transmission-Based Precautions: No Signs or symptoms of abuse/neglect since last visito No Patient Has Alerts: No Hospitalized since last visit: No Implantable device outside of the clinic excluding No cellular tissue based products placed in the center since last visit: Has Dressing in Place as Prescribed: Yes Has Compression in Place as Prescribed: Yes Pain Present Now: Yes Electronic Signature(s) Signed: 06/15/2022 1:35:31 PM By: [name] Entered By: [name] on 06/13/2022 15:46:31 -------------------------------------------------------------------------------- Clinic Level of Care Assessment Details Patient Name: Date of Service: [name], [name] [place] 06/13/2022 3:30 PM Medical Record Number: [id number] Patient Account Number: [id number] Date of Birth/Sex: Treating RN: [birth year] (37 y.o. [name] Primary Care Suzan Manon: [name] Other Clinician: [name] Referring Loa Idler: Treating Lamond Glantz/Extender:  [name] in Treatment: 8 Clinic Level of Care Assessment Items TOOL 1 Quantity Score '[]'$  - 0 Use when EandM and Procedure is performed on INITIAL visit ASSESSMENTS - Nursing Assessment / Reassessment '[]'$  - 0 General Physical Exam (combine w/ comprehensive assessment (listed just below) when performed on new pt. [id number][name] ([id number]) [id number] Page 2 of 9 '[]'$  - 0 Comprehensive Assessment (HX, ROS, Risk Assessments, Wounds Hx, etc.) ASSESSMENTS - Wound and Skin Assessment / Reassessment '[]'$  - 0 Dermatologic / Skin Assessment (not related to wound area) ASSESSMENTS - Ostomy and/or Continence Assessment and Care '[]'$  - 0 Incontinence Assessment and Management '[]'$  - 0 Ostomy Care Assessment and Management (repouching, etc.) PROCESS - Coordination of Care '[]'$  - 0 Simple Patient / Family Education for ongoing care '[]'$  - 0 Complex (extensive) Patient / Family Education for ongoing care '[]'$  - 0 Staff obtains Programmer, systems, Records, T Results / Process Orders est '[]'$  - 0 Staff telephones HHA, Nursing Homes / Clarify orders / etc '[]'$  - 0 Routine Transfer to another Facility (non-emergent condition) '[]'$  - 0 Routine Hospital Admission (non-emergent condition) '[]'$  - 0 New Admissions / Biomedical engineer / Ordering NPWT Apligraf, etc. , '[]'$  - 0 Emergency Hospital Admission (emergent condition) PROCESS - Special Needs '[]'$  - 0 Pediatric / Minor Patient Management '[]'$  - 0 Isolation Patient Management '[]'$  - 0 Hearing / Language / Visual special needs '[]'$  - 0 Assessment of Community assistance (transportation, D/C planning, etc.) '[]'$  - 0 Additional assistance / Altered mentation '[]'$  - 0 Support Surface(s) Assessment (bed, cushion, seat, etc.) INTERVENTIONS - Miscellaneous '[]'$  - 0 External ear exam '[]'$  - 0 Patient Transfer (multiple staff / Civil Service fast streamer / Similar devices) '[]'$  - 0 Simple Staple / Suture removal (25 or less) '[]'$  -  0 Complex Staple / Suture removal (26 or more) '[]'$  - 0 Hypo/Hyperglycemic Management (do not check if billed separately) '[]'$  - 0 Ankle / Brachial Index (ABI) - do not check  if billed separately Has the patient been seen at the hospital within the last three years: Yes Total Score: 0 Level Of Care: ____ Electronic Signature(s) Signed: 06/15/2022 1:35:31 PM By: [name] Entered By: [name] on 06/13/2022 16:03:21 -------------------------------------------------------------------------------- Encounter Discharge Information Details Patient Name: Date of Service: [name] Corporal [place] J. 06/13/2022 3:30 PM Medical Record Number: [id number] Patient Account Number: [id number] Date of Birth/Sex: Treating RN: 1985-08-22 (38 y.o. [name] Primary Care Gladyce Mcray: [name] Other Clinician: Jaylee, Bottari ([id number]) [id number] Page 3 of 9 Referring Jahfari Ambers: Treating Donelle Baba/Extender: [name] in Treatment: 8 Encounter Discharge Information Items Post Procedure Vitals Discharge Condition: Stable Temperature (F): 98.5 Ambulatory Status: Ambulatory Pulse (bpm): 94 Discharge Destination: Home Respiratory Rate (breaths/min): 18 Transportation: Private Auto Blood Pressure (mmHg): 116/70 Accompanied By: self Schedule Follow-up Appointment: Yes Clinical Summary of Care: Electronic Signature(s) Signed: 06/15/2022 1:35:31 PM By: [name] Entered By: [name] on 06/13/2022 16:31:53 -------------------------------------------------------------------------------- Lower Extremity Assessment Details Patient Name: Date of Service: MELICA, KRUPNICK MA J. 06/13/2022 3:30 PM Medical Record Number: [id number] Patient Account Number: [id number] Date of Birth/Sex: Treating RN: 1985/07/05 (37 y.o. [name] Primary Care Carter Kassel: [name] Other Clinician: [name] Referring  Malya Cirillo: Treating Leena Tiede/Extender: [name] in Treatment: 8 Edema Assessment Assessed: Shirlyn Goltz: Yes] Patrice Paradise: No] Edema: [Left: Ye] [Right: s] Calf Left: Right: Point of Measurement: 32 cm From Medial Instep 54.6 cm Ankle Left: Right: Point of Measurement: 12 cm From Medial Instep 31.7 cm Vascular Assessment Pulses: Dorsalis Pedis Palpable: [Left:Yes] Electronic Signature(s) Signed: 06/14/2022 6:12:13 PM By: Gretta Cool, [name], RN, CWS, [name], [name] Signed: 06/15/2022 1:35:31 PM By: [name] Entered By: [name] on 06/13/2022 15:56:51 Mcmanamon, Herminio Heads ([id number].pdf Page 4 of 9 -------------------------------------------------------------------------------- Multi Wound Chart Details Patient Name: Date of Service: [name], [name] [place]. 06/13/2022 3:30 PM Medical Record Number: [id number] Patient Account Number: [id number] Date of Birth/Sex: Treating RN: January 16, 1986 (37 y.o. [name] Primary Care Brynn Reznik: [name] Other Clinician: [name] Referring Dicky Boer: Treating Elise Knobloch/Extender: [name] in Treatment: 8 Vital Signs Height(in): 36 Pulse(bpm): 43 Weight(lbs): 400 Blood Pressure(mmHg): 116/70 Body Mass Index(BMI): 64.6 Temperature(F): 98.5 Respiratory Rate(breaths/min): 18 [5:Photos:] [N/A:N/A] Left, Medial Lower Leg N/A N/A Wound Location: Gradually Appeared N/A N/A Wounding Event: Venous Leg Ulcer N/A N/A Primary Etiology: Hypertension, Peripheral Venous N/A N/A Comorbid History: Disease 04/10/2022 N/A N/A Date Acquired: 8 N/A N/A Weeks of Treatment: Open N/A N/A Wound Status: No N/A N/A Wound Recurrence: 1.5x3x0.1 N/A N/A Measurements L x W x D (cm) 3.534 N/A N/A A (cm) : rea 0.353 N/A N/A Volume (cm) : 35.70% N/A N/A % Reduction in Area: 67.90% N/A N/A % Reduction in Volume: Full Thickness Without Exposed N/A  N/A Classification: Support Structures Medium N/A N/A Exudate A mount: Serosanguineous N/A N/A Exudate Type: red, brown N/A N/A Exudate Color: None Present (0%) N/A N/A Granulation Amount: Large (67-100%) N/A N/A Necrotic Amount: Eschar, Adherent Slough N/A N/A Necrotic Tissue: Fat Layer (Subcutaneous Tissue): Yes N/A N/A Exposed Structures: Fascia: No Tendon: No Muscle: No Joint: No Bone: No None N/A N/A Epithelialization: Treatment Notes Electronic Signature(s) Signed: 06/15/2022 1:35:31 PM By: [name] Entered By: [name] on 06/13/2022 15:56:55 Shima, Herminio Heads ([id number] Page 5 of 9 -------------------------------------------------------------------------------- Multi-Disciplinary Care Plan Details Patient Name: Date of Service: ALTAMEASE, CAMBRON [place]. 06/13/2022 3:30 PM Medical Record Number: [id number] Patient Account Number: [id number] Date of Birth/Sex: Treating RN: 10/03/1985 (  37 y.o. Charolette Forward, Kim Primary Care Nikolos Billig: [name] Other Clinician: [name] Referring Krystan Northrop: Treating Brigitta Pricer/Extender: [name] in Treatment: 8 Active Inactive Necrotic Tissue Nursing Diagnoses: Knowledge deficit related to management of necrotic/devitalized tissue Goals: Patient/caregiver will verbalize understanding of reason and process for debridement of necrotic tissue Date Initiated: 04/18/2022 Target Resolution Date: 05/19/2022 Goal Status: Active Interventions: Assess patient pain level pre-, during and post procedure and prior to discharge Provide education on necrotic tissue and debridement process Notes: Venous Leg Ulcer Nursing Diagnoses: Knowledge deficit related to disease process and management Goals: Patient will maintain optimal edema control Date Initiated: 04/18/2022 Target Resolution Date: 05/19/2022 Goal Status: Active Interventions: Assess peripheral edema status  every visit. Compression as ordered Notes: Wound/Skin Impairment Nursing Diagnoses: Knowledge deficit related to ulceration/compromised skin integrity Goals: Patient/caregiver will verbalize understanding of skin care regimen Date Initiated: 04/18/2022 Target Resolution Date: 05/19/2022 Goal Status: Active Ulcer/skin breakdown will have a volume reduction of 30% by week 4 Date Initiated: 04/18/2022 Target Resolution Date: 05/19/2022 Goal Status: Active Ulcer/skin breakdown will have a volume reduction of 50% by week 8 Date Initiated: 04/18/2022 Target Resolution Date: 06/18/2022 Goal Status: Active Ulcer/skin breakdown will have a volume reduction of 80% by week 12 Date Initiated: 04/18/2022 Target Resolution Date: 07/18/2022 Goal Status: Active Ulcer/skin breakdown will heal within 14 weeks Date Initiated: 04/18/2022 Target Resolution Date: 08/17/2022 KENNIYAH, FELLA ([id number]) [phone].pdf Page 6 of 9 Goal Status: Active Interventions: Assess patient/caregiver ability to obtain necessary supplies Assess patient/caregiver ability to perform ulcer/skin care regimen upon admission and as needed Assess ulceration(s) every visit Notes: Electronic Signature(s) Signed: 06/14/2022 6:12:13 PM By: Gretta Cool, [name], RN, CWS, [name], [name] Signed: 06/15/2022 1:35:31 PM By: [name] Entered By: [name] on 06/13/2022 16:08:25 -------------------------------------------------------------------------------- Pain Assessment Details Patient Name: Date of Service: [name] Corporal [place] 06/13/2022 3:30 PM Medical Record Number: [id number] Patient Account Number: [id number] Date of Birth/Sex: Treating RN: 07-Nov-1985 (37 y.o. [name] Primary Care Dallon Dacosta: [name] Other Clinician: [name] Referring Jourden Delmont: Treating Briseyda Fehr/Extender: [name] in Treatment: 8 Active Problems Location of Pain Severity and Description of  Pain Patient Has Paino Yes Site Locations Pain Location: Pain in Ulcers Duration of the Pain. Constant / Intermittento Intermittent Rate the pain. Current Pain Level: 6 Character of Pain Describe the Pain: Aching, Throbbing Pain Management and Medication Current Pain Management: Medication: Yes Cold Application: No Rest: No Massage: No Activity: No T.E.N.S.: No Heat Application: No Leg drop or elevation: No Is the Current Pain Management Adequate: Inadequate Electronic Signature(s) Signed: 06/14/2022 6:12:13 PM By: Gretta Cool, [name], RN, CWS, [name], [name] Signed: 06/15/2022 1:35:31 PM By: Darene Lamer ([id number] Page 7 of 9 Entered By: [name] on 06/13/2022 15:50:03 -------------------------------------------------------------------------------- Patient/Caregiver Education Details Patient Name: Date of Service: ELAINIE, VAUGHAN [place] 2/28/2024andnbsp3:30 PM Medical Record Number: [id number] Patient Account Number: [id number] Date of Birth/Gender: Treating RN: [birth year] (37 y.o. [name] Primary Care Physician: [name] Other Clinician: [name] Referring Physician: Treating Physician/Extender: [name] in Treatment: 8 Education Assessment Education Provided To: Patient Education Topics Provided Wound/Skin Impairment: Handouts: Other: continue wound care as directed Methods: Explain/Verbal Responses: State content correctly Electronic Signature(s) Signed: 06/15/2022 1:35:31 PM By: [name] Entered By: [name] on 06/13/2022 16:31:03 -------------------------------------------------------------------------------- Wound Assessment Details Patient Name: Date of Service: [name], [name] [place] 06/13/2022 3:30 PM Medical Record Number: [id number] Patient Account Number: [id number] Date of Birth/Sex:  Treating RN: 1985-09-11 (37 y.o. [name] Primary Care  Khai Arrona: [name] Other Clinician: [name] Referring Yosselin Zoeller: Treating Khalil Belote/Extender: [name] in Treatment: 8 Wound Status Wound Number: 5 Primary Etiology: Venous Leg Ulcer Wound Location: Left, Medial Lower Leg Wound Status: Open Wounding Event: Gradually Appeared Comorbid History: Hypertension, Peripheral Venous Disease Date Acquired: 04/10/2022 Weeks Of Treatment: 8 Clustered Wound: No Photos KRISTEN, FRANGELLA ([id number]) [phone].pdf Page 8 of 9 Wound Measurements Length: (cm) 1.5 Width: (cm) 3 Depth: (cm) 0.1 Area: (cm) 3.534 Volume: (cm) 0.353 % Reduction in Area: 35.7% % Reduction in Volume: 67.9% Epithelialization: None Wound Description Classification: Full Thickness Without Exposed Support Structures Exudate Amount: Medium Exudate Type: Serosanguineous Exudate Color: red, brown Foul Odor After Cleansing: No Slough/Fibrino Yes Wound Bed Granulation Amount: None Present (0%) Exposed Structure Necrotic Amount: Large (67-100%) Fascia Exposed: No Necrotic Quality: Eschar, Adherent Slough Fat Layer (Subcutaneous Tissue) Exposed: Yes Tendon Exposed: No Muscle Exposed: No Joint Exposed: No Bone Exposed: No Treatment Notes Wound #5 (Lower Leg) Wound Laterality: Left, Medial Cleanser [place] Ancillary Kit - 15 Day Supply Discharge Instruction: Use supplies as instructed; Kit contains: (15) Saline Bullets; (15) 3x3 Gauze; 15 pr Gloves Peri-Wound Care Topical Primary Dressing keystone Secondary Dressing (BORDER) Zetuvit Plus SILICONE BORDER Dressing 5x5 (in/in) Discharge Instruction: Please do not put silicone bordered dressings under wraps. Use non-bordered dressing only. Secured With Tubigrip Size F, 4x10 (in/yd) Discharge Instruction: double layer Apply Tubigrip F 3 finger-widths below knee to base of toes to secure dressing and/or for swelling. Compression Wrap Compression  Stockings Add-Ons Electronic Signature(s) Signed: 06/14/2022 6:12:13 PM By: Gretta Cool, [name], RN, CWS, [name], [name] Signed: 06/15/2022 1:35:31 PM By: [name] Entered By: [name] on 06/13/2022 15:55:55 Grob, Herminio Heads ([id number] Page 9 of 9 -------------------------------------------------------------------------------- Vitals Details Patient Name: Date of Service: [name], [name] [place]. 06/13/2022 3:30 PM Medical Record Number: [id number] Patient Account Number: [id number] Date of Birth/Sex: Treating RN: [birth year] (37 y.o. Charolette Forward, Kim Primary Care Chad Donoghue: [name] Other Clinician: [name] Referring Mckennon Zwart: Treating Morrison Masser/Extender: [name] in Treatment: 8 Vital Signs Time Taken: 15:47 Temperature (F): 98.5 Height (in): 66 Pulse (bpm): 94 Weight (lbs): 400 Respiratory Rate (breaths/min): 18 Body Mass Index (BMI): 64.6 Blood Pressure (mmHg): 116/70 Reference Range: 80 - 120 mg / dl Electronic Signature(s) Signed: 06/15/2022 1:35:31 PM By: [name] Entered By: [name] on 06/13/2022 15:49:58

## 2022-06-20 ENCOUNTER — Ambulatory Visit: Payer: BC Managed Care – PPO | Admitting: Internal Medicine

## 2022-06-22 ENCOUNTER — Encounter: Payer: Self-pay | Admitting: Family Medicine

## 2022-06-22 ENCOUNTER — Ambulatory Visit (INDEPENDENT_AMBULATORY_CARE_PROVIDER_SITE_OTHER): Payer: BC Managed Care – PPO | Admitting: Family Medicine

## 2022-06-22 VITALS — BP 138/82 | HR 76 | Ht 66.0 in | Wt >= 6400 oz

## 2022-06-22 DIAGNOSIS — Z6841 Body Mass Index (BMI) 40.0 and over, adult: Secondary | ICD-10-CM

## 2022-06-22 DIAGNOSIS — K219 Gastro-esophageal reflux disease without esophagitis: Secondary | ICD-10-CM

## 2022-06-22 DIAGNOSIS — G4486 Cervicogenic headache: Secondary | ICD-10-CM | POA: Diagnosis not present

## 2022-06-22 DIAGNOSIS — R7303 Prediabetes: Secondary | ICD-10-CM

## 2022-06-22 DIAGNOSIS — J302 Other seasonal allergic rhinitis: Secondary | ICD-10-CM | POA: Diagnosis not present

## 2022-06-22 DIAGNOSIS — R635 Abnormal weight gain: Secondary | ICD-10-CM

## 2022-06-22 DIAGNOSIS — E66813 Obesity, class 3: Secondary | ICD-10-CM

## 2022-06-22 LAB — POCT GLYCOSYLATED HEMOGLOBIN (HGB A1C): Hemoglobin A1C: 5.9 % — AB (ref 4.0–5.6)

## 2022-06-22 MED ORDER — AZELASTINE HCL 0.1 % NA SOLN: 2.0000 | Freq: Two times a day (BID) | NASAL | 12 refills | Status: DC

## 2022-06-22 MED ORDER — BACLOFEN 5 MG PO TABS: 5.0000 mg | ORAL_TABLET | Freq: Every evening | ORAL | 0 refills | Status: DC | PRN

## 2022-06-22 MED ORDER — ALBUTEROL SULFATE HFA 108 (90 BASE) MCG/ACT IN AERS: 2.0000 | INHALATION_SPRAY | Freq: Four times a day (QID) | RESPIRATORY_TRACT | 2 refills | Status: DC | PRN

## 2022-06-22 MED ORDER — PANTOPRAZOLE SODIUM 20 MG PO TBEC: 20.0000 mg | DELAYED_RELEASE_TABLET | Freq: Every day | ORAL | 0 refills | Status: DC

## 2022-06-22 NOTE — Patient Instructions (Addendum)
For your headaches we are going to try a muscle relaxer called baclofen at night only to see if that helps.   For your runny nose, it could be related to allergies and I recommend starting the nasal spray 2 sprays twice daily to see if it helps calm down the running nose.   Your A1c is in the pre-diabetic range. At this time I would recommend working on dietary and lifetstyle changes to help with weight loss.   I have sent in a refill of your pantoprazole as well as your albuterol inhaler. If you feel like you are using your inhaler multiple times per week I would recommend follow-up with your PCP within the next few weeks as well to see if we need to change inhalers.

## 2022-06-22 NOTE — Assessment & Plan Note (Signed)
A1c elevated to 5.9.,  Previously within normal limits in 2021, BMI 66 - Prediabetes educational handout given - Recommended lifestyle and dietary modifications - Follow-up with PCP - Repeat A1c in 6 months

## 2022-06-22 NOTE — Assessment & Plan Note (Signed)
Refill of pantoprazole 20 mg as needed provided

## 2022-06-22 NOTE — Assessment & Plan Note (Signed)
Patient with seasonal allergies, not currently taking allergy medications.  Given patient report of chronic rhinorrhea causing chronic cough treatment with antihistamine spray would be appropriate - Azelastine 2 sprays twice daily - Can trial oral antihistamine if patient prefers -if still having persistent cough after treatment for several weeks, recommend follow-up with PCP for asthma evaluation

## 2022-06-22 NOTE — Progress Notes (Signed)
    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  Has chronic rhinorrhea  - not taking any allergy medications  - feels like she has a chronic cough due to the drainage  Wheezing - Hx of asthma as a child - Reports it has been having since she had [place] multiple times per week - Sometimes stops her from being able to do activities  Reflux - Has not happened in the last few weeks - Uses protonix RPN   PERTINENT  PMH / PSH: Reviewed  OBJECTIVE:   BP 138/82   Pulse 76   Ht 5\' 6"  (1.676 m)   Wt (!) 410 lb (186 kg)   LMP 06/04/2022   SpO2 94%   BMI 66.18 kg/m   Gen: well-appearing, NAD CV: RRR, no m/r/g appreciated, no peripheral edema Pulm: CTAB, no wheezes/crackles GI: soft, non-tender, non-distended HEENT: TM clear bilaterally, no oropharyngeal erythema present Neck/Back: - Inspection: no gross deformity or asymmetry, swelling or ecchymosis - Palpation: TTP along the trapezius and paraspinal muscles of the neck, not TTP at the  spinous process, - ROM: full active ROM of the cervical spine with neck extension, rotation, flexion  - Strength: 5/5 BLE - Neuro: sensation intact in the C5-C8 nerve root distribution b/l - Special testing: negative slump test, negative spurling's  ASSESSMENT/PLAN:   Headaches, 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  Reassuringly no red flags on exam or history.  Increased stress from work and home as a contributing to headaches.. - Trial of baclofen 5 mg nightly -return and ER precautions discussed - Tylenol ibuprofen during the day if necessary  Prediabetes A1c elevated to 5.9.,  Previously within normal limits in  2021, BMI 66 - Prediabetes educational handout given - Recommended lifestyle and dietary modifications - Follow-up with PCP - Repeat A1c in 6 months   Seasonal allergies Patient with seasonal allergies, not currently taking allergy medications.  Given patient report of chronic rhinorrhea causing chronic cough treatment with antihistamine spray would be appropriate - Azelastine 2 sprays twice daily - Can trial oral antihistamine if patient prefers -if still having persistent cough after treatment for several weeks, recommend follow-up with PCP for asthma evaluation  Gastroesophageal reflux disease Refill of pantoprazole 20 mg as needed provided     Rise Patience, DO [place]

## 2022-06-26 ENCOUNTER — Encounter: Payer: BC Managed Care – PPO | Attending: Internal Medicine | Admitting: Internal Medicine

## 2022-06-26 DIAGNOSIS — I890 Lymphedema, not elsewhere classified: Secondary | ICD-10-CM | POA: Diagnosis not present

## 2022-06-26 DIAGNOSIS — I872 Venous insufficiency (chronic) (peripheral): Secondary | ICD-10-CM | POA: Diagnosis not present

## 2022-06-26 DIAGNOSIS — E669 Obesity, unspecified: Secondary | ICD-10-CM | POA: Diagnosis not present

## 2022-06-26 DIAGNOSIS — Z6841 Body Mass Index (BMI) 40.0 and over, adult: Secondary | ICD-10-CM | POA: Diagnosis not present

## 2022-06-26 DIAGNOSIS — L97822 Non-pressure chronic ulcer of other part of left lower leg with fat layer exposed: Secondary | ICD-10-CM | POA: Diagnosis not present

## 2022-06-26 DIAGNOSIS — I10 Essential (primary) hypertension: Secondary | ICD-10-CM | POA: Insufficient documentation

## 2022-06-26 DIAGNOSIS — I87312 Chronic venous hypertension (idiopathic) with ulcer of left lower extremity: Secondary | ICD-10-CM

## 2022-06-27 ENCOUNTER — Ambulatory Visit: Payer: BC Managed Care – PPO | Admitting: Internal Medicine

## 2022-06-27 NOTE — Progress Notes (Signed)
CARYSS, HODDE ([id number]) [id number] Page 1 of 10 Visit Report for 06/26/2022 Chief Complaint Document Details Patient Name: Date of Service: BERKELEY, CARDIEL [place]. 06/26/2022 3:45 PM Medical Record Number: [id number] Patient Account Number: [id number] Date of Birth/Sex: Treating RN: 21-Nov-1985 (37 y.o. Valetta Close Primary Care Provider: [name] Other Clinician: Referring Provider: Treating Provider/Extender: [name] in Treatment: 9 Information Obtained from: Patient Chief Complaint 04/18/2022; Left LE Ulcer Electronic Signature(s) Signed: 06/26/2022 4:13:56 PM By: Kalman Shan DO Entered By: Kalman Shan on 06/26/2022 16:10:22 -------------------------------------------------------------------------------- HPI Details Patient Name: Date of Service: [place], [place] J. 06/26/2022 3:45 PM Medical Record Number: [id number] Patient Account Number: [id number] Date of Birth/Sex: Treating RN: Feb 13, 1986 (37 y.o. Valetta Close Primary Care Provider: [name] Other Clinician: Referring Provider: Treating Provider/Extender: [name] in Treatment: 9 History of Present Illness HPI Description: 37 year old patient who started with having ulcerations on the right lower leg on the lateral part of her ankle for about 2 weeks. She was seen in the ER at [facility] and advised to see the wound care for a consultation. No X-rays of workup was done during the ER visit and no prescription for any medications of compression wraps were given. the patient is not diabetic but does have hypertension and her medications have been reviewed by me. 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 The right and left greater saphenous  vein was found to be tortuous. Deep venous system was also not competent and there was reflux of more than 500 ms. She was then seen by Dr. Darene Lamer Early who recommended that the patient would not benefit from endovenous ablation and he had recommended vein stripping odd on the right side and multiple small phlebectomy procedures on the left side. the patient did not follow-up due to social economic reasons. She has not been wearing any compression stockings and has not taken any specific treatment for varicose veins for the last 3 years. 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 We have obtained some appointments to see the vascular surgeons by the end of the month and the patient would like to follow up with me at my [facility] on Wednesday, June 29. 10/14/2014 -- she could not see me yesterday in [place] and hence has come for a review today. She has a vascular workup to be done this afternoon at [facility] ([id number]) [phone].pdf Page 2 of 10 [place]. She is doing fine otherwise. 10/22/2014 -- she was seen by Dr. Althea Charon and he has recommended surgical removal of her right saphenous vein from distal thigh to saphenofemoral odd junction and stab phlebectomy's of multiple large tributary branches throughout her thigh and calf. This would be done under general anesthesia in the outpatient setting. 10/29/2014 -- she is trying to work on a surgical date and in the meanwhile we have got insurance clearance for Apligraf and we will start this next week. 7/22 2016 -- she is here for the first application of Apligraf. 11/19/2014 -- she is here for a second application of Apligraf 11/26/2014 -- she has done fine after her last application of Apligraf and is awaiting her surgery which is scheduled for August 31.  12/03/2014 -- she is doing fine and is here for  her third application of Apligraf. 12/21/2014 -- She had surgery on 12/15/2014 by Dr.Early who did #1 ligation and stripping of right great saphenous vein from distal thigh to saphenofemoral junction, #2 stab phlebectomy of large tributary varicose veins in the thigh popliteal space and calf. She had an Ace wrap up to her groin and this was removed today and the Unna's boot was also removed. 12/28/2014 -- she is here for her fourth application of Apligraf. 01/06/2015 - he saw her vascular surgeon Dr. Donnetta Hutching who was pleased with her progress and he has confirmed that no surgical procedures could be attempted on the left side. 01/13/2015 -- her wound looks very good and she's been having no problems whatsoever. Readmission: 07/26/2020 upon evaluation today patient presents for initial inspection here in our clinic for a new issue with her left leg although she is previously been seen due to issues with the right leg back in 2016. At that time she was seeing Dr. Donnetta Hutching who is a vein/vascular specialist in [place]. He has since semiretired from what I understand. He is working out of [employer] I believe. Nonetheless she tells me at the time that there was really nothing to do for her left leg although the right leg was where they did most of the work. Subsequently she states that she is done fairly well until just in the past week where she had issues with bleeding from what appears to be varicose vein on the left leg medially. Unfortunately this has continued to be an issue although she tells me at first it was coming much more significantly Down quite a bit but nonetheless has not completely resolved. Every time she showers she notices that it starts to drain a little bit more. She does have a history of chronic venous insufficiency, lymphedema, varicose veins bilaterally, and obesity. 08/02/2020 upon evaluation today patient appears to be doing about the same in regards to the ulcer on her left leg.  She has some eschar covering there is definitely some fluid collecting underneath unfortunately. With that being said she tells me she is still having a tremendous amount of pain therefore she is really not able to allow me to clean this off very effectively to be perfectly honest. I think we need to try to soften this up 08/16/2020 upon evaluation today patient's wound is really not doing significantly better not really states about the same. She notes that the wrap just does not seem to be staying up very well at all unfortunately. No fevers, chills, nausea, vomiting, or diarrhea. She did cut it off once it starts to slide in order to alleviate some of the pressure from sliding Down. Fortunately there is no signs of active infection at this time which is great news. 08/23/2020 upon evaluation today patient appears to be doing well 08/23/2020 upon evaluation today patient appears to be doing well with regard to her wound all things considered. Fortunately there does not appear to be any signs of active infection at this time which is great news. She has been tolerating the dressing changes without complication and overall I am extremely pleased with where things stand at this point. She does have her appointment with vascular in [facility] on June 9. 08/30/2020 upon evaluation today patient actually appears to be doing decently well in regard to her wound. Fortunately there is no signs of active infection which is great news. Nonetheless I do believe that the  patient is going require little bit of debridement if she is okay with me attempting that today I think that will help clean off some of the necrotic tissue. Fortunately there does not appear to be otherwise any evidence of active infection which is also great news. 09/19/2020 upon evaluation today patient appears to be doing a little better in regard to her wound as compared to previous. Fortunately there does not appear to be any signs of active  infection overall. No fever chills noted. I do believe that the Iodosorb has made this a little bit better with regard to the overall size and appearance of the wound bed though again she does still have quite a ways to go to get this to heal she still very tender to touch. 09/27/2020 upon evaluation today patient appears to actually be doing quite well with regard to her wound. This is measuring much smaller which is great news. With that being said she did see vein and vascular in [facility] and they subsequently recommended that surgery is really what she probably needs to go forward with sounds like the potential for venous ablation. With that being said the patient tells me this is just not the right time for her to be able to proceed with any type of surgery which I completely understand. Nonetheless I do believe that she would continue to benefit from compression but again that is really not something that she is able to easily do. 10/04/2020 upon evaluation today patient appears to be doing about the same in regard to her wound. This is measuring a little bit smaller but nonetheless still is open and again has some slough and biofilm noted on the surface of the wound. There does not appear to be any signs of active infection which is great news. No fevers, chills, nausea, vomiting, or diarrhea. 10/04/2020 upon evaluation today patient appears to be doing well with regard to her wound. She has been tolerating dressing changes without complication. Fortunately there does not appear to be any signs of active infection which is great news. No fevers, chills, nausea, vomiting, or diarrhea. 10/25/2020 upon evaluation today patient appears to be doing well with regard to her wound. She has been tolerating the dressing changes without complication. Fortunately there is no signs of active infection at this time. No fevers, chills, nausea, vomiting, or diarrhea. 11/01/2020 upon evaluation today patient with  regard to her wound. She has been tolerating the dressing changes without complication. Fortunately there does not appear to be any signs of infection which is great news. No fever chills noted 11/15/2020 upon evaluation today patient appears to be doing well with regard to her wound. Fortunately there is no signs of active infection at this time. No fevers, chills, nausea, vomiting, or diarrhea. With that being said she continues to have a significant amount of pain at the site even though this is very close to complete closure. She also had several varicose veins around the area which were also problematic. Overall however I feel like the patient is making excellent progress. 11/28/2020 upon evaluation today patient appears to be doing well with regard to her leg ulcer. Again were not really able to debride or compression wrap her due to discomfort and pain. She does not allow for that. With that being said we have been using Iodosorb which does seem to be doing decently well. Fortunately there is no signs of active infection at this time which is great news. No fevers, chills, nausea, vomiting,  or diarrhea. 8/31; patient presents for 2-week follow-up. She has been using Iodosorb. She reports that the wound is closed. She denies signs of infection. Readmission: 10-27-2021 upon evaluation this is a patient that presents today whom I have previously seen this is pretty much for the same issue though I think a little bit higher than the last time I saw her. She does have a history of chronic venous insufficiency and hypertension along with varicose veins. Subsequently she does have an ulceration which spontaneously ruptured she has not been wearing any compression which I think is a big part of the issue here. We discussed this before I really think she probably needs to be wearing her compression therapy, she probably needs lymphedema pumps if she can ever wear the compression for a significant amount of  time to get these, and subsequently also think that she needs to be elevating her legs is much as possible she may even need some vascular intervention in regard to her veins. All of this was reiterated and discussed with her today to reinforce what needs to happen in order to ensure that her legs do not get a lot worse. The patient voiced understanding. She tells me that she knows because she is seeing her mom go through a lot of this as well how bad things can get. TRINESE, WOODLE ([id number]) [id number] Page 3 of 10 11-03-2021 upon evaluation today patient appears to be doing well with regard to her wound. Fortunately there does not appear to be any signs of active infection at this time. She is measuring a little bit bigger but I think this is because the wound is actually cleaning up a bit here. 7/27; left lateral leg wound not any smaller but perhaps with a cleaner surface. She is using Iodoflex to help with the latter and using Tubigrip. She has chronic venous insufficiency with secondary lymphedema. She is apparently followed by vein and vascular and is being scheduled for an ablation 11-17-2021 upon evaluation today patient appears to be doing well with regard to her wound this is actually showing signs of improvement which is great news. Fortunately I do not see any evidence of active infection locally or systemically at this time which is great news. No fevers, chills, nausea, vomiting, or diarrhea. 11-24-2021 upon evaluation today patient's wound is actually showing signs of significant improvement. Unfortunately she had quite a bit of pain with debridement last week I do believe it was beneficial but at the same time she is doing much better but still really does not want this debrided again I think being that it is looking a whole lot better I would try to avoid that today especially since it caused her so much discomfort that is really not the goal and I  explained that to the patient today she voiced understanding and knows that it needed to be done but still states that it was quite painful. 11-30-2021 upon evaluation today patient appears to be doing excellent in regard to her wound this is actually showing signs of excellent improvement I am very pleased with where things stand. She does have her venous ablation appointment for October 17. 12-21-2021 upon evaluation today patient appears to be doing better in regard to her wound this is measuring smaller and looking better as well. Fortunately I do not see any signs of active infection locally or systemically which is great news. 01-01-2022 upon evaluation today patient appears to be doing well currently in regard to her wound.  She is showing signs of improvement which is great news and overall I do not see any signs of active infection locally or systemically at this time. 01-08-2022 upon evaluation today patient appears to be doing well currently in regard to her wound she is actually showing signs of significant improvement which is great news. Fortunately I do not see any evidence of active infection locally or systemically at this time. I do believe that we are on the right track. She also has her appointment October 19 for the venous ablation. 01-16-2022 upon evaluation today patient's wound actually is showing some signs of improvement although this is very slow. Fortunately I do not see any evidence of infection at this time. The volume is a little bit more although the size is smaller I think this is due to the fact that we are slowly cleaning this area out effectively. 11/6 continued improvement the patient is using Iodoflex and a Tubigrip E. She was supposed to have venous surgery at vein and vascular in [place] however somehow this is gotten delayed till December 14. 02-27-2022 upon evaluation today patient's wound is actually showing signs of being completely healed. Fortunately I do not  see any evidence of active infection locally or systemically which is great news and overall I am extremely pleased with where we stand today. Unfortunately she does tell me that she postpone her venous ablation surgery until December 14 she tells me that she was not ready "financially" for this. 04/18/2022; [name] is a 37 year old female with a past medical history of venous insufficiency that presents the clinic for a left lower extremity wound. She was seen almost 2 months ago for the same wound. This had healed with Iodoflex and Tubigrip. She states that the wound recently reopened. She has seen vein and vascular and plan is for ligation and stripping of the left great saphenous vein. She is not sure when this procedure is going to be scheduled. She has canceled it once before. She has had office compression wraps in the past however these do not stay on and create more of an issue for her. She would like to avoid this. She currently denies signs of infection. 1/10; patient presents for follow-up. She has been using Iodoflex to the wound bed. She states she has been using her Tubigrip however she does not have this on today. She has no issues or complaints today. She denies signs of infection. 1/17; patient presents for follow-up. She has not been using Iodoflex to the wound bed. She reports acute pain. She denies increased warmth, erythema or purulent drainage to the left lower extremity. She states she has been using Tubigrip. She has information to order the compression stockings but has not obtained them. 1/24; Patient had a wound culture done at last clinic visit that grew Proteus mirabilis and E. coli. She was prescribed levofloxacin and this should cover both bacteria. She has been taking the medication over the past week with improvement of symptoms to the wound bed. She has been using Hydrofera Blue under Tubigrip. She states the [facility] is sticking to her wound  bed. 1/31; patient presents for follow-up. She has been using Medihoney to the wound bed with improvement in healing. She has been contacted by [facility] to order her antibiotic ointment. She is not sure yet if she is able to afford this. She received her compression stockings but states they did not fit well. She is working on returning these. She has been using  Tubigrip. 2/7; patient presents for follow-up. She is been using Medihoney to the wound bed. She obtained her compression stockings and has been using these daily. She states that the [facility] antibiotic ointment is arriving in the mail tomorrow. She has no issues or complaints today. She denies systemic signs of infection. 2/14; patient presents for follow-up. She has been using Keystone antibiotic ointment to the wound bed. She states the [facility] is sticking and she has stopped this. She is been using her compression stockings daily. She does not have these on today. 2/28; patient presents for follow-up. She continues to use [facility] antibiotic ointment to the wound bed. There is been improvement in healing. She has been using her compression stockings daily. 3/12; patient presents for follow-up. She has been using Keystone antibiotic ointment to the wound bed. She is not wearing her compression stockings today but states she does wear them daily. She declines debridement today. Electronic Signature(s) Signed: 06/26/2022 4:13:56 PM By: Kalman Shan DO Entered By: Kalman Shan on 06/26/2022 16:10:49 Hilscher, Herminio Heads ([id number]) [id number] Page 4 of 10 -------------------------------------------------------------------------------- Physical Exam Details Patient Name: Date of Service: BETINA, GAUER [place] 06/26/2022 3:45 PM Medical Record Number: [id number] Patient Account Number: [id number] Date of Birth/Sex: Treating RN: 07/31/1985 (37 y.o. Valetta Close Primary Care Provider: [name] Other Clinician: Referring Provider: Treating Provider/Extender: [name] in Treatment: 9 Constitutional . Cardiovascular . Psychiatric . Notes Left lower extremity: T the distal medial aspect there is an open wound with granulation tissue and nonviable tissue. No increased warmth, erythema or o purulent drainage. Stage III lymphedema Electronic Signature(s) Signed: 06/26/2022 4:13:56 PM By: Kalman Shan DO Entered By: Kalman Shan on 06/26/2022 16:11:17 -------------------------------------------------------------------------------- Physician Orders Details Patient Name: Date of Service: [name] 06/26/2022 3:45 PM Medical Record Number: [id number] Patient Account Number: [id number] Date of Birth/Sex: Treating RN: 04-20-1985 (37 y.o. Valetta Close Primary Care Provider: [name] Other Clinician: Referring Provider: Treating Provider/Extender: [name] in Treatment: 9 Verbal / Phone Orders: No Diagnosis Coding ICD-10 Coding Code Description I87.312 Chronic venous hypertension (idiopathic) with ulcer of left lower extremity L97.822 Non-pressure chronic ulcer of other part of left lower leg with fat layer exposed I89.0 Lymphedema, not elsewhere classified Follow-up Appointments Wound #5 Left,Medial Lower Leg Return Appointment in 1 week. MALORI, SCHATZLE ([id number]) [id number] Page 5 of 10 Bathing/ [employer] wounds with antibacterial soap and water. May shower with wound dressing protected with water repellent cover or cast protector. No tub bath. Edema Control - Lymphedema / Segmental Compressive Device / Other Wound #5 Left,Medial Lower Leg Patient to wear own compression stockings. Remove compression stockings every night before going to bed and put on every morning when getting up. - LLE, pt made MD Mykelle Cockerell aware she had her own  stockings to wear Elevate, Exercise Daily and A void Standing for Long Periods of Time. Elevate legs to the level of the heart and pump ankles as often as possible Elevate leg(s) parallel to the floor when sitting. Medications-Please add to medication list. Wound #5 Left,Medial Lower Leg Keystone Compound Wound Treatment Wound #5 - Lower Leg Wound Laterality: Left, Medial Cleanser: Soap and Water 1 x Per Day/30 Days Discharge Instructions: Gently cleanse wound with antibacterial soap, rinse and pat dry prior to dressing wounds Cleanser: Vashe 5.8 (oz) 1 x Per Day/30 Days Discharge Instructions: Use vashe 5.8 (oz) as directed Peri-Wound Care: AandD Ointment 1 x Per Day/30 Days  Discharge Instructions: Apply AandD Ointment as directed around edges to help dressing from sticking Topical: keystone 1 x Per Day/30 Days Prim Dressing: Hydrofera Blue Ready Transfer Foam, 2.5x2.5 (in/in) 1 x Per Day/30 Days ary Discharge Instructions: Apply Hydrofera Blue Ready to wound bed as directed cut to fit wound bed Secondary Dressing: (BORDER) Zetuvit Plus SILICONE BORDER Dressing 4x4 (in/in) 1 x Per Day/30 Days Discharge Instructions: Please do not put silicone bordered dressings under wraps. Use non-bordered dressing only. Electronic Signature(s) Signed: 06/26/2022 4:34:40 PM By: Levora Dredge Signed: 06/26/2022 4:50:32 PM By: Kalman Shan DO Previous Signature: 06/26/2022 4:27:23 PM Version By: Levora Dredge Previous Signature: 06/26/2022 4:13:56 PM Version By: Kalman Shan DO Entered By: Levora Dredge on 06/26/2022 16:27:42 -------------------------------------------------------------------------------- Problem List Details Patient Name: Date of Service: [name] 06/26/2022 3:45 PM Medical Record Number: [id number] Patient Account Number: [id number] Date of Birth/Sex: Treating RN: [birth year] (37 y.o. 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 06/26/2022 3:45 PM Medical Record Number: [id number] Patient Account Number: [id number] Date of Birth/Sex: Treating RN: 1985/09/07 (37 y.o. Valetta Close Primary Care Provider: [name] Other Clinician: Referring Provider: Treating Provider/Extender: [name] in Treatment: 9 Subjective Chief Complaint Information obtained from Patient 04/18/2022; Left LE Ulcer History of Present Illness (HPI) 37 year old patient who started with having ulcerations on the right lower leg on the lateral part of her ankle for about 2 weeks. She was seen in the ER at [facility] and advised to see the wound care for a consultation. No X-rays of workup was done during the ER visit and no prescription for any medications of compression wraps were given. the patient is not diabetic but does have hypertension and her medications have been reviewed by me. 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 The right and left greater saphenous vein was found to be tortuous. Deep venous system was also not competent and there was reflux of more than 500 ms. She was then seen by Dr. Darene Lamer Early who recommended that the patient would not benefit from endovenous ablation and he had recommended vein stripping odd on the right side and multiple small phlebectomy procedures on the left side. the patient did not follow-up due to social economic reasons. She has not been wearing any compression stockings and has not taken any specific treatment for varicose veins for the last 3 years. 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 We have obtained some appointments to see the vascular surgeons by the end of the month and the patient would like to follow up with me at my  [facility] clinic on Wednesday, June 29. 10/14/2014 -- she could not see me yesterday in [place] and hence has come for a review today. She has a vascular workup to be done this afternoon at [facility]. She is doing fine otherwise. 10/22/2014 -- she was seen by Dr. Althea Charon and he has recommended surgical removal of her right saphenous vein from distal thigh to saphenofemoral odd junction and stab phlebectomy's of multiple large tributary branches throughout her thigh and calf. This would be done under general anesthesia in the outpatient setting. 10/29/2014 -- she is trying to work on a surgical date and in the meanwhile we have got insurance clearance for Apligraf and we will start this next week. 7/22 2016 -- she is here for the first application of Apligraf. 11/19/2014 -- she is here for a second application of Apligraf 11/26/2014 -- she has done fine after her last application of Apligraf and is awaiting her surgery which is scheduled for August 31. 12/03/2014 -- she is doing fine and is here for  her third application of Apligraf. 12/21/2014 -- She had surgery on 12/15/2014 by Dr.Early who did #1 ligation and stripping of right great saphenous vein from distal thigh to saphenofemoral junction, #2 stab phlebectomy of large tributary varicose veins in the thigh popliteal space and calf. She had an Ace wrap up to her groin and this was removed today and the Unna's boot was also removed. 12/28/2014 -- she is here for her fourth application of Apligraf. KENNI, BEDELL ([id number]) [id number] Page 7 of 10 01/06/2015 - he saw her vascular surgeon Dr. Donnetta Hutching who was pleased with her progress and he has confirmed that no surgical procedures could be attempted on the left side. 01/13/2015 -- her wound looks very good and she's been having no problems whatsoever. Readmission: 07/26/2020 upon evaluation today patient presents for initial inspection here in our clinic for a new issue with her left leg although she is previously been seen due to issues with the right leg back in 2016. At that time she was seeing Dr. Donnetta Hutching who is a vein/vascular specialist in [place]. He has since semiretired from what I understand. He is working out of [employer] I believe. Nonetheless she tells me at the time that there was really nothing to do for her left leg although the right leg was where they did most of the work. Subsequently she states that she is done fairly well until just in the past week where she had issues with bleeding from what appears to be varicose vein on the left leg medially. Unfortunately this has continued to be an issue although she tells me at first it was coming much more significantly Down quite a bit but nonetheless has not completely resolved. Every time she showers she notices that it starts to drain a little bit more. She does have a history of chronic venous insufficiency, lymphedema, varicose veins bilaterally, and obesity. 08/02/2020 upon evaluation today  patient appears to be doing about the same in regards to the ulcer on her left leg. She has some eschar covering there is definitely some fluid collecting underneath unfortunately. With that being said she tells me she is still having a tremendous amount of pain therefore she is really not able to allow me to clean this off very effectively to be perfectly honest. I think we need to try to soften this up 08/16/2020 upon evaluation today patient's wound is really not doing significantly better not really states about the same. She  notes that the wrap just does not seem to be staying up very well at all unfortunately. No fevers, chills, nausea, vomiting, or diarrhea. She did cut it off once it starts to slide in order to alleviate some of the pressure from sliding Down. Fortunately there is no signs of active infection at this time which is great news. 08/23/2020 upon evaluation today patient appears to be doing well 08/23/2020 upon evaluation today patient appears to be doing well with regard to her wound all things considered. Fortunately there does not appear to be any signs of active infection at this time which is great news. She has been tolerating the dressing changes without complication and overall I am extremely pleased with where things stand at this point. She does have her appointment with vascular in [facility] on June 9. 08/30/2020 upon evaluation today patient actually appears to be doing decently well in regard to her wound. Fortunately there is no signs of active infection which is great news. Nonetheless I do believe that the patient is going require little bit of debridement if she is okay with me attempting that today I think that will help clean off some of the necrotic tissue. Fortunately there does not appear to be otherwise any evidence of active infection which is also great news. 09/19/2020 upon evaluation today patient appears to be doing a little better in regard to her wound as  compared to previous. Fortunately there does not appear to be any signs of active infection overall. No fever chills noted. I do believe that the Iodosorb has made this a little bit better with regard to the overall size and appearance of the wound bed though again she does still have quite a ways to go to get this to heal she still very tender to touch. 09/27/2020 upon evaluation today patient appears to actually be doing quite well with regard to her wound. This is measuring much smaller which is great news. With that being said she did see vein and vascular in [facility] and they subsequently recommended that surgery is really what she probably needs to go forward with sounds like the potential for venous ablation. With that being said the patient tells me this is just not the right time for her to be able to proceed with any type of surgery which I completely understand. Nonetheless I do believe that she would continue to benefit from compression but again that is really not something that she is able to easily do. 10/04/2020 upon evaluation today patient appears to be doing about the same in regard to her wound. This is measuring a little bit smaller but nonetheless still is open and again has some slough and biofilm noted on the surface of the wound. There does not appear to be any signs of active infection which is great news. No fevers, chills, nausea, vomiting, or diarrhea. 10/04/2020 upon evaluation today patient appears to be doing well with regard to her wound. She has been tolerating dressing changes without complication. Fortunately there does not appear to be any signs of active infection which is great news. No fevers, chills, nausea, vomiting, or diarrhea. 10/25/2020 upon evaluation today patient appears to be doing well with regard to her wound. She has been tolerating the dressing changes without complication. Fortunately there is no signs of active infection at this time. No fevers,  chills, nausea, vomiting, or diarrhea. 11/01/2020 upon evaluation today patient with regard to her wound. She has been tolerating the dressing changes without complication.  Fortunately there does not appear to be any signs of infection which is great news. No fever chills noted 11/15/2020 upon evaluation today patient appears to be doing well with regard to her wound. Fortunately there is no signs of active infection at this time. No fevers, chills, nausea, vomiting, or diarrhea. With that being said she continues to have a significant amount of pain at the site even though this is very close to complete closure. She also had several varicose veins around the area which were also problematic. Overall however I feel like the patient is making excellent progress. 11/28/2020 upon evaluation today patient appears to be doing well with regard to her leg ulcer. Again were not really able to debride or compression wrap her due to discomfort and pain. She does not allow for that. With that being said we have been using Iodosorb which does seem to be doing decently well. Fortunately there is no signs of active infection at this time which is great news. No fevers, chills, nausea, vomiting, or diarrhea. 8/31; patient presents for 2-week follow-up. She has been using Iodosorb. She reports that the wound is closed. She denies signs of infection. Readmission: 10-27-2021 upon evaluation this is a patient that presents today whom I have previously seen this is pretty much for the same issue though I think a little bit higher than the last time I saw her. She does have a history of chronic venous insufficiency and hypertension along with varicose veins. Subsequently she does have an ulceration which spontaneously ruptured she has not been wearing any compression which I think is a big part of the issue here. We discussed this before I really think she probably needs to be wearing her compression therapy, she probably  needs lymphedema pumps if she can ever wear the compression for a significant amount of time to get these, and subsequently also think that she needs to be elevating her legs is much as possible she may even need some vascular intervention in regard to her veins. All of this was reiterated and discussed with her today to reinforce what needs to happen in order to ensure that her legs do not get a lot worse. The patient voiced understanding. She tells me that she knows because she is seeing her mom go through a lot of this as well how bad things can get. 11-03-2021 upon evaluation today patient appears to be doing well with regard to her wound. Fortunately there does not appear to be any signs of active infection at this time. She is measuring a little bit bigger but I think this is because the wound is actually cleaning up a bit here. 7/27; left lateral leg wound not any smaller but perhaps with a cleaner surface. She is using Iodoflex to help with the latter and using Tubigrip. She has chronic venous insufficiency with secondary lymphedema. She is apparently followed by vein and vascular and is being scheduled for an ablation 11-17-2021 upon evaluation today patient appears to be doing well with regard to her wound this is actually showing signs of improvement which is great news. Fortunately I do not see any evidence of active infection locally or systemically at this time which is great news. No fevers, chills, nausea, vomiting, or diarrhea. 11-24-2021 upon evaluation today patient's wound is actually showing signs of significant improvement. Unfortunately she had quite a bit of pain with debridement last week I do believe it was beneficial but at the same time she is doing much better  but still really does not want this debrided again I think being that it is looking a whole lot better I would try to avoid that today especially since it caused her so much discomfort that is really not the goal and I  explained that to the patient today she voiced understanding and knows that it needed to be done but still states that it was quite painful. DORISE, EGELER ([id number]) [id number] Page 8 of 10 11-30-2021 upon evaluation today patient appears to be doing excellent in regard to her wound this is actually showing signs of excellent improvement I am very pleased with where things stand. She does have her venous ablation appointment for October 17. 12-21-2021 upon evaluation today patient appears to be doing better in regard to her wound this is measuring smaller and looking better as well. Fortunately I do not see any signs of active infection locally or systemically which is great news. 01-01-2022 upon evaluation today patient appears to be doing well currently in regard to her wound. She is showing signs of improvement which is great news and overall I do not see any signs of active infection locally or systemically at this time. 01-08-2022 upon evaluation today patient appears to be doing well currently in regard to her wound she is actually showing signs of significant improvement which is great news. Fortunately I do not see any evidence of active infection locally or systemically at this time. I do believe that we are on the right track. She also has her appointment October 19 for the venous ablation. 01-16-2022 upon evaluation today patient's wound actually is showing some signs of improvement although this is very slow. Fortunately I do not see any evidence of infection at this time. The volume is a little bit more although the size is smaller I think this is due to the fact that we are slowly cleaning this area out effectively. 11/6 continued improvement the patient is using Iodoflex and a Tubigrip E. She was supposed to have venous surgery at vein and vascular in [place] however somehow this is gotten delayed till December 14. 02-27-2022 upon evaluation today  patient's wound is actually showing signs of being completely healed. Fortunately I do not see any evidence of active infection locally or systemically which is great news and overall I am extremely pleased with where we stand today. Unfortunately she does tell me that she postpone her venous ablation surgery until December 14 she tells me that she was not ready "financially" for this. 04/18/2022; [name] is a 37 year old female with a past medical history of venous insufficiency that presents the clinic for a left lower extremity wound. She was seen almost 2 months ago for the same wound. This had healed with Iodoflex and Tubigrip. She states that the wound recently reopened. She has seen vein and vascular and plan is for ligation and stripping of the left great saphenous vein. She is not sure when this procedure is going to be scheduled. She has canceled it once before. She has had office compression wraps in the past however these do not stay on and create more of an issue for her. She would like to avoid this. She currently denies signs of infection. 1/10; patient presents for follow-up. She has been using Iodoflex to the wound bed. She states she has been using her Tubigrip however she does not have this on today. She has no issues or complaints today. She denies signs of infection. 1/17; patient presents for follow-up.  She has not been using Iodoflex to the wound bed. She reports acute pain. She denies increased warmth, erythema or purulent drainage to the left lower extremity. She states she has been using Tubigrip. She has information to order the compression stockings but has not obtained them. 1/24; Patient had a wound culture done at last clinic visit that grew Proteus mirabilis and E. coli. She was prescribed levofloxacin and this should cover both bacteria. She has been taking the medication over the past week with improvement of symptoms to the wound bed. She has been using  Hydrofera Blue under Tubigrip. She states the [facility] is sticking to her wound bed. 1/31; patient presents for follow-up. She has been using Medihoney to the wound bed with improvement in healing. She has been contacted by [facility] to order her antibiotic ointment. She is not sure yet if she is able to afford this. She received her compression stockings but states they did not fit well. She is working on returning these. She has been using Tubigrip. 2/7; patient presents for follow-up. She is been using Medihoney to the wound bed. She obtained her compression stockings and has been using these daily. She states that the [facility] antibiotic ointment is arriving in the mail tomorrow. She has no issues or complaints today. She denies systemic signs of infection. 2/14; patient presents for follow-up. She has been using Keystone antibiotic ointment to the wound bed. She states the [facility] is sticking and she has stopped this. She is been using her compression stockings daily. She does not have these on today. 2/28; patient presents for follow-up. She continues to use [facility] antibiotic ointment to the wound bed. There is been improvement in healing. She has been using her compression stockings daily. 3/12; patient presents for follow-up. She has been using Keystone antibiotic ointment to the wound bed. She is not wearing her compression stockings today but states she does wear them daily. She declines debridement today. Objective Constitutional Vitals Time Taken: 3:55 PM, Height: 66 in, Weight: 400 lbs, BMI: 64.6, Temperature: 97.5 F, Pulse: 108 bpm, Respiratory Rate: 18 breaths/min, Blood Pressure: 124/82 mmHg. General Notes: Left lower extremity: T the distal medial aspect there is an open wound with granulation tissue and nonviable tissue. No increased warmth, o erythema or purulent drainage. Stage III lymphedema Integumentary (Hair, Skin) Wound #5 status is Open. Original cause  of wound was Gradually Appeared. The date acquired was: 04/10/2022. The wound has been in treatment 9 weeks. The wound is located on the Left,Medial Lower Leg. The wound measures 1.5cm length x 2.8cm width x 0.2cm depth; 3.299cm^2 area and 0.66cm^3 volume. There is Fat Layer (Subcutaneous Tissue) exposed. There is no tunneling or undermining noted. There is a medium amount of serosanguineous drainage noted. There is medium (34-66%) red granulation within the wound bed. There is a medium (34-66%) amount of necrotic tissue within the wound bed including Adherent Slough. 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 She declined debridement today. No signs of infection. I recommended adding the Hydrofera Blue to the [place] antibiotic to help with further debridement. She states she will try this. Also recommended she wear compression stockings every day. Plan 1. Hydrofera Blue with Keystone antibiotic ointment 2. Follow-up in 1 week 3. Compression stockings daily Electronic  Signature(s) Signed: 06/26/2022 4:13:56 PM By: Kalman Shan DO Entered By: Kalman Shan on 06/26/2022 16:12:08 -------------------------------------------------------------------------------- ROS/PFSH Details Patient Name: Date of Service: [name] 06/26/2022 3:45 PM Medical Record Number: [id number] Patient Account Number: [id number] Date of Birth/Sex: Treating RN: 1985/08/03 (37 y.o. Valetta Close Primary Care Provider: [name] Other Clinician: Referring Provider: Treating Provider/Extender: [name] in Treatment: 9 Information Obtained  From Patient Cardiovascular Medical History: Positive for: Hypertension; Peripheral Venous Disease Endocrine Medical History: Past Medical History Notes: pre DM, A1C 5.5 12/21 Immunizations Pneumococcal Vaccine: Received Pneumococcal Vaccination: No Implantable Devices None Hospitalization / Surgery History Type of Hospitalization/Surgery umb hernia repair right lower leg vein surgery Family and Social History MARLAYSIA, SENDEJO ([id number]) [id number] Page 10 of 10 Never smoker; Marital Status - Single; Alcohol Use: Rarely; Drug Use: Current History - marijuana; Caffeine Use: Rarely; Financial Concerns: No; Food, Clothing or Shelter Needs: No; Support System Lacking: No; Transportation Concerns: No Electronic Signature(s) Signed: 06/26/2022 4:13:56 PM By: Kalman Shan DO Signed: 06/26/2022 4:34:40 PM By: Levora Dredge Entered By: Kalman Shan on 06/26/2022 16:13:27 -------------------------------------------------------------------------------- SuperBill Details Patient Name: Date of Service: [name] 06/26/2022 Medical Record Number: [id number] Patient Account Number: [id number] Date of Birth/Sex: Treating RN: Mar 08, 1986 (37 y.o. Valetta Close Primary Care Provider: [name] Other Clinician: Referring Provider: Treating Provider/Extender: [name] in Treatment: 9 Diagnosis Coding ICD-10 Codes Code Description [phone] Chronic venous hypertension (idiopathic) with ulcer of left lower extremity L97.822 Non-pressure chronic ulcer of other part of left lower leg with fat layer exposed I89.0 Lymphedema, not elsewhere classified Physician Procedures : CPT4 Code Description Modifier [id number] - WC PHYS LEVEL 3 - EST PT ICD-10 Diagnosis Description I87.312 Chronic venous hypertension (idiopathic) with ulcer of left lower extremity L97.822 Non-pressure chronic ulcer of other part of left  lower leg  with fat layer exposed I89.0 Lymphedema, not elsewhere classified Quantity: 1 Electronic Signature(s) Signed: 06/26/2022 4:28:14 PM By: Levora Dredge Signed: 06/26/2022 4:50:32 PM By: Kalman Shan DO Previous Signature: 06/26/2022 4:13:56 PM Version By: Kalman Shan DO Entered By: Levora Dredge on 06/26/2022 16:28:13

## 2022-06-27 NOTE — Progress Notes (Signed)
[name], [name] ([id number]) [id number] Page 1 of 9 Visit Report for 06/26/2022 Arrival Information Details Patient Name: Date of Service: [name], [name] [place]. 06/26/2022 3:45 PM Medical Record Number: [id number] Patient Account Number: [id number] Date of Birth/Sex: Treating RN: 09-06-1985 (37 y.o. Valetta Close Primary Care [name] [name]: [name] Other Clinician: Referring Yaslene Lindamood: Treating Valary Manahan/Extender: [name] in Treatment: 9 Visit Information History Since Last Visit Added or deleted any medications: Yes Patient Arrived: Ambulatory Any new allergies or adverse reactions: No Arrival Time: 15:55 Had a fall or experienced change in No Accompanied By: self activities of daily living that may affect Transfer Assistance: None risk of falls: Patient Identification Verified: Yes Hospitalized since last visit: No Secondary Verification Process Completed: Yes Has Dressing in Place as Prescribed: Yes Patient Requires Transmission-Based Precautions: No Pain Present Now: Yes Patient Has Alerts: No Electronic Signature(s) Signed: 06/26/2022 4:34:40 PM By: Levora Dredge Entered By: Levora Dredge on 06/26/2022 15:56:10 -------------------------------------------------------------------------------- Clinic Level of Care Assessment Details Patient Name: Date of Service: [name], [name] [place] 06/26/2022 3:45 PM Medical Record Number: [id number] Patient Account Number: [id number] Date of Birth/Sex: Treating RN: 02-May-1985 (37 y.o. Valetta Close Primary Care Treasure Ochs: [name] Other Clinician: Referring Beaulah Romanek: Treating Owens Hara/Extender: [name] in Treatment: 9 Clinic Level of Care Assessment Items TOOL 4 Quantity Score '[]'$  - 0 Use when only an EandM is performed on FOLLOW-UP visit ASSESSMENTS - Nursing Assessment / Reassessment X- 1 10 Reassessment of Co-morbidities  (includes updates in patient status) X- 1 5 Reassessment of Adherence to Treatment Plan ASSESSMENTS - Wound and Skin A ssessment / Reassessment X - Simple Wound Assessment / Reassessment - one wound 1 5 '[]'$  - 0 Complex Wound Assessment / Reassessment - multiple wounds [name], [name] ([id number]) [phone].pdf Page 2 of 9 '[]'$  - 0 Dermatologic / Skin Assessment (not related to wound area) ASSESSMENTS - Focused Assessment X- 1 5 Circumferential Edema Measurements - multi extremities '[]'$  - 0 Nutritional Assessment / Counseling / Intervention '[]'$  - 0 Lower Extremity Assessment (monofilament, tuning fork, pulses) '[]'$  - 0 Peripheral Arterial Disease Assessment (using hand held doppler) ASSESSMENTS - Ostomy and/or Continence Assessment and Care '[]'$  - 0 Incontinence Assessment and Management '[]'$  - 0 Ostomy Care Assessment and Management (repouching, etc.) PROCESS - Coordination of Care X - Simple Patient / Family Education for ongoing care 1 15 '[]'$  - 0 Complex (extensive) Patient / Family Education for ongoing care '[]'$  - 0 Staff obtains Programmer, systems, Records, T Results / Process Orders est '[]'$  - 0 Staff telephones HHA, Nursing Homes / Clarify orders / etc '[]'$  - 0 Routine Transfer to another Facility (non-emergent condition) '[]'$  - 0 Routine Hospital Admission (non-emergent condition) '[]'$  - 0 New Admissions / Biomedical engineer / Ordering NPWT Apligraf, etc. , '[]'$  - 0 Emergency Hospital Admission (emergent condition) X- 1 10 Simple Discharge Coordination '[]'$  - 0 Complex (extensive) Discharge Coordination PROCESS - Special Needs '[]'$  - 0 Pediatric / Minor Patient Management '[]'$  - 0 Isolation Patient Management '[]'$  - 0 Hearing / Language / Visual special needs '[]'$  - 0 Assessment of Community assistance (transportation, D/C planning, etc.) '[]'$  - 0 Additional assistance / Altered mentation '[]'$  - 0 Support Surface(s) Assessment (bed, cushion, seat, etc.) INTERVENTIONS  - Wound Cleansing / Measurement X - Simple Wound Cleansing - one wound 1 5 '[]'$  - 0 Complex Wound Cleansing - multiple wounds X- 1 5 Wound Imaging (photographs - any number of wounds) '[]'$  - 0 Wound Tracing (  instead of photographs) X- 1 5 Simple Wound Measurement - one wound '[]'$  - 0 Complex Wound Measurement - multiple wounds INTERVENTIONS - Wound Dressings X - Small Wound Dressing one or multiple wounds 1 10 '[]'$  - 0 Medium Wound Dressing one or multiple wounds '[]'$  - 0 Large Wound Dressing one or multiple wounds X- 1 5 Application of Medications - topical '[]'$  - 0 Application of Medications - injection INTERVENTIONS - Miscellaneous '[]'$  - 0 External ear exam '[]'$  - 0 Specimen Collection (cultures, biopsies, blood, body fluids, etc.) '[]'$  - 0 Specimen(s) / Culture(s) sent or taken to Lab for analysis [name], [name] ([id number]) [phone].pdf Page 3 of 9 '[]'$  - 0 Patient Transfer (multiple staff / Civil Service fast streamer / Similar devices) '[]'$  - 0 Simple Staple / Suture removal (25 or less) '[]'$  - 0 Complex Staple / Suture removal (26 or more) '[]'$  - 0 Hypo / Hyperglycemic Management (close monitor of Blood Glucose) '[]'$  - 0 Ankle / Brachial Index (ABI) - do not check if billed separately X- 1 5 Vital Signs Has the patient been seen at the hospital within the last three years: Yes Total Score: 85 Level Of Care: New/Established - Level 3 Electronic Signature(s) Signed: 06/26/2022 4:34:40 PM By: Levora Dredge Entered By: Levora Dredge on 06/26/2022 16:28:05 -------------------------------------------------------------------------------- Encounter Discharge Information Details Patient Name: Date of Service: [name] Corporal MA J. 06/26/2022 3:45 PM Medical Record Number: [id number] Patient Account Number: [id number] Date of Birth/Sex: Treating RN: 20-Mar-1986 (37 y.o. Valetta Close Primary Care Niyati Heinke: [name] Other Clinician: Referring Lagretta Loseke: Treating  Kallan Merrick/Extender: [name] in Treatment: 9 Encounter Discharge Information Items Discharge Condition: Stable Ambulatory Status: Ambulatory Discharge Destination: Home Transportation: Private Auto Accompanied By: self Schedule Follow-up Appointment: Yes Clinical Summary of Care: Electronic Signature(s) Signed: 06/26/2022 4:29:13 PM By: Levora Dredge Entered By: Levora Dredge on 06/26/2022 16:29:13 -------------------------------------------------------------------------------- Lower Extremity Assessment Details Patient Name: Date of Service: [name], BUSTLE MA J. 06/26/2022 3:45 PM Medical Record Number: [id number] Patient Account Number: [id number] Date of Birth/Sex: Treating RN: July 28, 1985 (37 y.o. Valetta Close Primary Care Nevaen Tredway: [name] Other Clinician: Referring Jarmarcus Wambold: Treating Gwyneth Fernandez/Extender: Dnia, Streight, Herminio Heads ([id number]) [id number] Page 4 of 9 Weeks in Treatment: 9 Edema Assessment Assessed: [Left: No] [Right: No] [Left: Edema] [Right: :] Calf Left: Right: Point of Measurement: 32 cm From Medial Instep 57 cm Ankle Left: Right: Point of Measurement: 12 cm From Medial Instep 32.9 cm Vascular Assessment Pulses: Dorsalis Pedis Palpable: [Left:Yes] Doppler Audible: [Left:Yes] Posterior Tibial Palpable: [Left:Yes] Electronic Signature(s) Signed: 06/26/2022 4:34:40 PM By: Levora Dredge Entered By: Levora Dredge on 06/26/2022 16:05:00 -------------------------------------------------------------------------------- Multi Wound Chart Details Patient Name: Date of Service: [name] Corporal MA J. 06/26/2022 3:45 PM Medical Record Number: [id number] Patient Account Number: [id number] Date of Birth/Sex: Treating RN: Aug 29, 1985 (37 y.o. Valetta Close Primary Care Adrienna Karis: [name] Other Clinician: Referring Zadaya Cuadra: Treating Lillee Mooneyhan/Extender:  [name] in Treatment: 9 Vital Signs Height(in): 66 Pulse(bpm): 108 Weight(lbs): 400 Blood Pressure(mmHg): 124/82 Body Mass Index(BMI): 64.6 Temperature(F): 97.5 Respiratory Rate(breaths/min): 18 [5:Photos:] [N/A:N/A] Left, Medial Lower Leg N/A N/A Wound Location: Gradually Appeared N/A N/A Wounding Event: Venous Leg Ulcer N/A N/A Primary Etiology: Hypertension, Peripheral Venous N/A N/A Comorbid HistoryMYKENNA, SERVICE ([id number]) [id number] Page 5 of 9 Disease 04/10/2022 N/A N/A Date Acquired: 9 N/A N/A Weeks of Treatment: Open N/A N/A Wound Status: No N/A N/A Wound Recurrence: 1.5x2.8x0.2 N/A N/A Measurements L x W x D (cm) 3.299 N/A  N/A A (cm) : rea 0.66 N/A N/A Volume (cm) : 40.00% N/A N/A % Reduction in Area: 40.00% N/A N/A % Reduction in Volume: Full Thickness Without Exposed N/A N/A Classification: Support Structures Medium N/A N/A Exudate Amount: Serosanguineous N/A N/A Exudate Type: red, brown N/A N/A Exudate Color: Medium (34-66%) N/A N/A Granulation Amount: Red N/A N/A Granulation Quality: Medium (34-66%) N/A N/A Necrotic Amount: Fat Layer (Subcutaneous Tissue): Yes N/A N/A Exposed Structures: Fascia: No Tendon: No Muscle: No Joint: No Bone: No Small (1-33%) N/A N/A Epithelialization: Treatment Notes Electronic Signature(s) Signed: 06/26/2022 4:34:40 PM By: Levora Dredge Entered By: Levora Dredge on 06/26/2022 16:05:05 -------------------------------------------------------------------------------- Multi-Disciplinary Care Plan Details Patient Name: Date of Service: [name] Corporal MA J. 06/26/2022 3:45 PM Medical Record Number: [id number] Patient Account Number: [id number] Date of Birth/Sex: Treating RN: 21-May-1985 (37 y.o. Valetta Close Primary Care Yesenia Locurto: [name] Other Clinician: Referring Latise Dilley: Treating Meriem Lemieux/Extender: [name] in Treatment: 9 Active Inactive Necrotic Tissue Nursing Diagnoses: Knowledge deficit related to management of necrotic/devitalized tissue Goals: Patient/caregiver will verbalize understanding of reason and process for debridement of necrotic tissue Date Initiated: 04/18/2022 Target Resolution Date: 05/19/2022 Goal Status: Active Interventions: Assess patient pain level pre-, during and post procedure and prior to discharge Provide education on necrotic tissue and debridement process Notes: Venous Leg Ulcer Nursing Diagnoses: [name], [name] ([id number]) [phone].pdf Page 6 of 9 Knowledge deficit related to disease process and management Goals: Patient will maintain optimal edema control Date Initiated: 04/18/2022 Target Resolution Date: 05/19/2022 Goal Status: Active Interventions: Assess peripheral edema status every visit. Compression as ordered Notes: Wound/Skin Impairment Nursing Diagnoses: Knowledge deficit related to ulceration/compromised skin integrity Goals: Patient/caregiver will verbalize understanding of skin care regimen Date Initiated: 04/18/2022 Target Resolution Date: 05/19/2022 Goal Status: Active Ulcer/skin breakdown will have a volume reduction of 30% by week 4 Date Initiated: 04/18/2022 Target Resolution Date: 05/19/2022 Goal Status: Active Ulcer/skin breakdown will have a volume reduction of 50% by week 8 Date Initiated: 04/18/2022 Target Resolution Date: 06/18/2022 Goal Status: Active Ulcer/skin breakdown will have a volume reduction of 80% by week 12 Date Initiated: 04/18/2022 Target Resolution Date: 07/18/2022 Goal Status: Active Ulcer/skin breakdown will heal within 14 weeks Date Initiated: 04/18/2022 Target Resolution Date: 08/17/2022 Goal Status: Active Interventions: Assess patient/caregiver ability to obtain necessary supplies Assess patient/caregiver ability to perform ulcer/skin care regimen upon  admission and as needed Assess ulceration(s) every visit Notes: Electronic Signature(s) Signed: 06/26/2022 4:28:20 PM By: Levora Dredge Entered By: Levora Dredge on 06/26/2022 16:28:20 -------------------------------------------------------------------------------- Pain Assessment Details Patient Name: Date of Service: [name] Corporal MA J. 06/26/2022 3:45 PM Medical Record Number: [id number] Patient Account Number: [id number] Date of Birth/Sex: Treating RN: 10-Dec-1985 (37 y.o. Valetta Close Primary Care Aleysha Meckler: [name] Other Clinician: Referring Ortha Metts: Treating Naman Spychalski/Extender: [name] in Treatment: 9 Active Problems Location of Pain Severity and Description of Pain Patient Has Paino Yes Site Locations Rate the pain. [name], [name] ([id number]) [id number] Page 7 of 9 Rate the pain. Current Pain Level: 7 Pain Management and Medication Current Pain Management: Notes pain at w ound site Electronic Signature(s) Signed: 06/26/2022 4:34:40 PM By: Levora Dredge Entered By: Levora Dredge on 06/26/2022 15:56:57 -------------------------------------------------------------------------------- Patient/Caregiver Education Details Patient Name: Date of Service: [name] Corporal MA J. 3/12/2024andnbsp3:45 PM Medical Record Number: [id number] Patient Account Number: [id number] Date of Birth/Gender: Treating RN: Feb 22, 1986 (37 y.o. Valetta Close Primary Care Physician: [name] Other Clinician: Referring Physician: Treating Physician/Extender: [name]  in Treatment: 9 Education Assessment Education Provided To: Patient Education Topics Provided Wound/Skin Impairment: Handouts: Caring for Your Ulcer Methods: Explain/Verbal Responses: State content correctly Electronic Signature(s) Signed: 06/26/2022 4:34:40 PM By: Levora Dredge Entered By: Levora Dredge on  06/26/2022 16:28:36 Yehle, Herminio Heads ([id number].pdf Page 8 of 9 -------------------------------------------------------------------------------- Wound Assessment Details Patient Name: Date of Service: MOJOLAOLUWA, [name] [place] 06/26/2022 3:45 PM Medical Record Number: [id number] Patient Account Number: [id number] Date of Birth/Sex: Treating RN: [birth year] (37 y.o. Valetta Close Primary Care Sheldon Amara: [name] Other Clinician: Referring Karl Erway: Treating Aidee Latimore/Extender: [name] in Treatment: 9 Wound Status Wound Number: 5 Primary Etiology: Venous Leg Ulcer Wound Location: Left, Medial Lower Leg Wound Status: Open Wounding Event: Gradually Appeared Comorbid History: Hypertension, Peripheral Venous Disease Date Acquired: 04/10/2022 Weeks Of Treatment: 9 Clustered Wound: No Photos Wound Measurements Length: (cm) 1.5 Width: (cm) 2.8 Depth: (cm) 0.2 Area: (cm) 3.299 Volume: (cm) 0.66 % Reduction in Area: 40% % Reduction in Volume: 40% Epithelialization: Small (1-33%) Tunneling: No Undermining: No Wound Description Classification: Full Thickness Without Exposed Support Structures Exudate Amount: Medium Exudate Type: Serosanguineous Exudate Color: red, brown Foul Odor After Cleansing: No Slough/Fibrino Yes Wound Bed Granulation Amount: Medium (34-66%) Exposed Structure Granulation Quality: Red Fascia Exposed: No Necrotic Amount: Medium (34-66%) Fat Layer (Subcutaneous Tissue) Exposed: Yes Necrotic Quality: Adherent Slough Tendon Exposed: No Muscle Exposed: No Joint Exposed: No Bone Exposed: No Treatment Notes Wound #5 (Lower Leg) Wound Laterality: Left, Medial Cleanser Soap and Water Discharge Instruction: Gently cleanse wound with antibacterial soap, rinse and pat dry prior to dressing wounds Vashe 5.8 (oz) [name], [name] ([id number]) [id number] Page 9 of  9 Discharge Instruction: Use vashe 5.8 (oz) as directed [place] Discharge Instruction: Apply AandD Ointment as directed around edges to help dressing from sticking Topical keystone Primary Dressing Hydrofera Blue Ready Transfer Foam, 2.5x2.5 (in/in) Discharge Instruction: Apply Hydrofera Blue Ready to wound bed as directed cut to fit wound bed Secondary Dressing (BORDER) Zetuvit Plus SILICONE BORDER Dressing 4x4 (in/in) Discharge Instruction: Please do not put silicone bordered dressings under wraps. Use non-bordered dressing only. Secured With Compression Wrap Compression Stockings Environmental education officer) Signed: 06/26/2022 4:34:40 PM By: Levora Dredge Entered By: Levora Dredge on 06/26/2022 16:04:29 -------------------------------------------------------------------------------- Vitals Details Patient Name: Date of Service: [name] Corporal MA J. 06/26/2022 3:45 PM Medical Record Number: [id number] Patient Account Number: [id number] Date of Birth/Sex: Treating RN: 11-05-1985 (37 y.o. Valetta Close Primary Care Argyle Gustafson: [name] Other Clinician: Referring Delisia Mcquiston: Treating Verlin Uher/Extender: [name] in Treatment: 9 Vital Signs Time Taken: 15:55 Temperature (F): 97.5 Height (in): 66 Pulse (bpm): 108 Weight (lbs): 400 Respiratory Rate (breaths/min): 18 Body Mass Index (BMI): 64.6 Blood Pressure (mmHg): 124/82 Reference Range: 80 - 120 mg / dl Electronic Signature(s) Signed: 06/26/2022 4:34:40 PM By: Levora Dredge Entered By: Levora Dredge on 06/26/2022 15:56:41

## 2022-06-29 ENCOUNTER — Other Ambulatory Visit: Payer: Self-pay | Admitting: Family Medicine

## 2022-07-04 ENCOUNTER — Encounter (HOSPITAL_BASED_OUTPATIENT_CLINIC_OR_DEPARTMENT_OTHER): Payer: BC Managed Care – PPO | Admitting: Internal Medicine

## 2022-07-04 DIAGNOSIS — L97822 Non-pressure chronic ulcer of other part of left lower leg with fat layer exposed: Secondary | ICD-10-CM | POA: Diagnosis not present

## 2022-07-04 DIAGNOSIS — I87312 Chronic venous hypertension (idiopathic) with ulcer of left lower extremity: Secondary | ICD-10-CM | POA: Diagnosis not present

## 2022-07-07 NOTE — Progress Notes (Signed)
TA, GAME ([id number]) [id number] Page 1 of 10 Visit Report for 07/04/2022 Chief Complaint Document Details [name] Name: Date of Service: [name], [name] [place]. 07/04/2022 3:00 PM Medical Record Number: [id number] [name] Account Number: [id number] Date of Birth/Sex: Treating RN: 03/27/1986 (37 y.o. Orvan Falconer Primary Care Provider: [name] Other Clinician: Referring Provider: Treating Provider/Extender: [name] in Treatment: 11 Information Obtained from: [name] Chief Complaint 04/18/2022; Left LE Ulcer Electronic Signature(s) Signed: 07/05/2022 11:53:51 AM By: Kalman Shan DO Entered By: Kalman Shan on 07/04/2022 15:39:25 -------------------------------------------------------------------------------- Debridement Details [name] Name: Date of Service: [name] [name] [place] J. 07/04/2022 3:00 PM Medical Record Number: [id number] [name] Account Number: [id number] Date of Birth/Sex: Treating RN: [birth year] (37 y.o. Orvan Falconer Primary Care Provider: [name] Other Clinician: Referring Provider: Treating Provider/Extender: [name] in Treatment: 11 Debridement Performed for Assessment: Wound #5 Left,Medial Lower Leg Performed By: Physician Kalman Shan, MD Debridement Type: Debridement Severity of Tissue Pre Debridement: Fat layer exposed Level of Consciousness (Pre-procedure): Awake and Alert Pre-procedure Verification/Time Out Yes - 15:35 Taken: Start Time: 15:35 T Area Debrided (L x W): otal 1.5 (cm) x 1.5 (cm) = 2.25 (cm) Tissue and other material debrided: Viable, Non-Viable, Slough, Slough Level: Non-Viable Tissue Debridement Description: Selective/Open Wound Instrument: Curette Bleeding: Minimum End Time: 15:37 Procedural Pain: 0 Post Procedural Pain: 0 Response to Treatment: Procedure was tolerated well Level of Consciousness (Post- Awake and  Alert procedure): Post Debridement Measurements of Total Wound Length: (cm) 1.5 Width: (cm) 3 Depth: (cm) 0.2 Volume: (cm) 0.707 Character of Wound/Ulcer Post Debridement: Improved Severity of Tissue Post Debridement: Fat layer exposed Post Procedure Diagnosis Same as Pre-procedure Electronic Signature(s) Signed: 07/05/2022 11:53:51 AM By: Kalman Shan DO Signed: 07/06/2022 11:27:58 AM By: Carlene Coria RN Entered By: Carlene Coria on 07/04/2022 15:37:34 [name], [name] ([id number]) [id number] Page 2 of 10 -------------------------------------------------------------------------------- HPI Details [name] Name: Date of Service: [name], [name] [place]. 07/04/2022 3:00 PM Medical Record Number: [id number] [name] Account Number: [id number] Date of Birth/Sex: Treating RN: 02-27-1986 (37 y.o. Orvan Falconer Primary Care Provider: [name] Other Clinician: Referring Provider: Treating Provider/Extender: [name] in Treatment: 11 History of Present Illness HPI Description: 37 year old [name] who started with having ulcerations on the right lower leg on the lateral part of her ankle for about 2 weeks. She was seen in the ER at [facility] and advised to see the wound care for a consultation. No X-rays of workup was done during the ER visit and no prescription for any medications of compression wraps were given. the [name] is not diabetic but does have hypertension and her medications have been reviewed by me. 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 The right and left greater saphenous vein was found to be tortuous. Deep venous system was also not competent and there was reflux of more than 500 ms. She was then seen by Dr. Darene Lamer Early who recommended that the  [name] would not benefit from endovenous ablation and he had recommended vein stripping odd on the right side and multiple small phlebectomy procedures on the left side. the [name] did not follow-up due to social economic reasons. She has not been wearing any compression stockings and has not taken any specific treatment for varicose veins for the last 3 years.  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 We have obtained some appointments to see the vascular surgeons by the end of the month and the [name] would like to follow up with me at my [facility] on Wednesday, June 29. 10/14/2014 -- she could not see me yesterday in [place] and hence has come for a review today. She has a vascular workup to be done this afternoon at [facility]. She is doing fine otherwise. 10/22/2014 -- she was seen by Dr. Althea Charon and he has recommended surgical removal of her right saphenous vein from distal thigh to saphenofemoral odd junction and stab phlebectomy's of multiple large tributary branches throughout her thigh and calf. This would be done under general anesthesia in the outpatient setting. 10/29/2014 -- she is trying to work on a surgical date and in the meanwhile we have got insurance clearance for Apligraf and we will start this next week. 7/22 2016 -- she is here for the first application of Apligraf. 11/19/2014 -- she is here for a second application of Apligraf 11/26/2014 -- she has done fine after her last application of Apligraf and is awaiting her surgery which is scheduled for August 31. 12/03/2014 -- she is doing fine and is here for her third application of Apligraf. 12/21/2014 -- She had surgery on 12/15/2014 by Dr.Early who did #1 ligation and stripping of right great saphenous vein from distal thigh to saphenofemoral junction, #2 stab phlebectomy of large tributary varicose veins in  the thigh popliteal space and calf. She had an Ace wrap up to her groin and this was removed today and the Unna's boot was also removed. 12/28/2014 -- she is here for her fourth application of Apligraf. 01/06/2015 - he saw her vascular surgeon Dr. Donnetta Hutching who was pleased with her progress and he has confirmed that no surgical procedures could be attempted on the left side. 01/13/2015 -- her wound looks very good and she's been having no problems whatsoever. Readmission: 07/26/2020 upon evaluation today [name] presents for initial inspection here in our clinic for a new issue with her left leg although she is previously been seen due to issues with the right leg back in 2016. At that time she was seeing Dr. Donnetta Hutching who is a vein/vascular specialist in [place]. He has since semiretired from what I understand. He is working out of [employer] I believe. Nonetheless she tells me at the time that there was really nothing to do for her left leg although the right leg was where they did most of the work. Subsequently she states that she is done fairly well until just in the past week where she had issues with bleeding from what appears to be varicose vein on the left leg medially. Unfortunately this has continued to be an issue although she tells me at first it was coming much more significantly Down quite a bit but nonetheless has not completely resolved. Every time she showers she notices that it starts to drain a little bit more. She does have a history of chronic venous insufficiency, lymphedema, varicose veins bilaterally, and obesity. 08/02/2020 upon evaluation today [name] appears to be doing about the same in regards to the ulcer on her left leg. She has some eschar covering there is definitely some fluid collecting underneath unfortunately. With that being said she tells me she is still having a tremendous amount of pain therefore she is really not  able to allow me to clean this off very effectively  to be perfectly honest. I think we need to try to soften this up 08/16/2020 upon evaluation today [name]'s wound is really not doing significantly better not really states about the same. She notes that the wrap just does not seem to be staying up very well at all unfortunately. No fevers, chills, nausea, vomiting, or diarrhea. She did cut it off once it starts to slide in order to alleviate some of the pressure from sliding Down. Fortunately there is no signs of active infection at this time which is great news. 08/23/2020 upon evaluation today [name] appears to be doing well 08/23/2020 upon evaluation today [name] appears to be doing well with regard to her wound all things considered. Fortunately there does not appear to be any signs of active infection at this time which is great news. She has been tolerating the dressing changes without complication and overall I am extremely pleased with where things stand at this point. She does have her appointment with vascular in [facility] on June 9. 08/30/2020 upon evaluation today [name] actually appears to be doing decently well in regard to her wound. Fortunately there is no signs of active infection which is great news. Nonetheless I do believe that the [name] is going require little bit of debridement if she is okay with me attempting that today I think that will help clean off some of the necrotic tissue. Fortunately there does not appear to be otherwise any evidence of active infection which is also great news. 09/19/2020 upon evaluation today [name] appears to be doing a little better in regard to her wound as compared to previous. Fortunately there does not appear to be any signs of active infection overall. No fever chills noted. I do believe that the Iodosorb has made this a little bit better with regard to the overall size and appearance of the wound bed though again she does still have quite a ways to go to get this to heal she still very tender  to touch. 09/27/2020 upon evaluation today [name] appears to actually be doing quite well with regard to her wound. This is measuring much smaller which is great news. With that being said she did see vein and vascular in [facility] and they subsequently recommended that surgery is really what she probably needs to go forward with sounds like the potential for venous ablation. With that being said the [name] tells me this is just not the right time for her to be able to proceed with any type of surgery which I completely understand. Nonetheless I do believe that she would continue to benefit from compression but again that is really not something that she is able to easily do. BRAELYNN, HANIGAN ([id number]) [id number] Page 3 of 10 10/04/2020 upon evaluation today [name] appears to be doing about the same in regard to her wound. This is measuring a little bit smaller but nonetheless still is open and again has some slough and biofilm noted on the surface of the wound. There does not appear to be any signs of active infection which is great news. No fevers, chills, nausea, vomiting, or diarrhea. 10/04/2020 upon evaluation today [name] appears to be doing well with regard to her wound. She has been tolerating dressing changes without complication. Fortunately there does not appear to be any signs of active infection which is great news. No fevers, chills, nausea, vomiting, or diarrhea. 10/25/2020 upon evaluation today [name] appears to be doing  well with regard to her wound. She has been tolerating the dressing changes without complication. Fortunately there is no signs of active infection at this time. No fevers, chills, nausea, vomiting, or diarrhea. 11/01/2020 upon evaluation today [name] with regard to her wound. She has been tolerating the dressing changes without complication. Fortunately there does not appear to be any signs of infection which is great news. No fever  chills noted 11/15/2020 upon evaluation today [name] appears to be doing well with regard to her wound. Fortunately there is no signs of active infection at this time. No fevers, chills, nausea, vomiting, or diarrhea. With that being said she continues to have a significant amount of pain at the site even though this is very close to complete closure. She also had several varicose veins around the area which were also problematic. Overall however I feel like the [name] is making excellent progress. 11/28/2020 upon evaluation today [name] appears to be doing well with regard to her leg ulcer. Again were not really able to debride or compression wrap her due to discomfort and pain. She does not allow for that. With that being said we have been using Iodosorb which does seem to be doing decently well. Fortunately there is no signs of active infection at this time which is great news. No fevers, chills, nausea, vomiting, or diarrhea. 8/31; [name] presents for 2-week follow-up. She has been using Iodosorb. She reports that the wound is closed. She denies signs of infection. Readmission: 10-27-2021 upon evaluation this is a [name] that presents today whom I have previously seen this is pretty much for the same issue though I think a little bit higher than the last time I saw her. She does have a history of chronic venous insufficiency and hypertension along with varicose veins. Subsequently she does have an ulceration which spontaneously ruptured she has not been wearing any compression which I think is a big part of the issue here. We discussed this before I really think she probably needs to be wearing her compression therapy, she probably needs lymphedema pumps if she can ever wear the compression for a significant amount of time to get these, and subsequently also think that she needs to be elevating her legs is much as possible she may even need some vascular intervention in regard to her veins. All  of this was reiterated and discussed with her today to reinforce what needs to happen in order to ensure that her legs do not get a lot worse. The [name] voiced understanding. She tells me that she knows because she is seeing her mom go through a lot of this as well how bad things can get. 11-03-2021 upon evaluation today [name] appears to be doing well with regard to her wound. Fortunately there does not appear to be any signs of active infection at this time. She is measuring a little bit bigger but I think this is because the wound is actually cleaning up a bit here. 7/27; left lateral leg wound not any smaller but perhaps with a cleaner surface. She is using Iodoflex to help with the latter and using Tubigrip. She has chronic venous insufficiency with secondary lymphedema. She is apparently followed by vein and vascular and is being scheduled for an ablation 11-17-2021 upon evaluation today [name] appears to be doing well with regard to her wound this is actually showing signs of improvement which is great news. Fortunately I do not see any evidence of active infection locally or systemically at this  time which is great news. No fevers, chills, nausea, vomiting, or diarrhea. 11-24-2021 upon evaluation today [name]'s wound is actually showing signs of significant improvement. Unfortunately she had quite a bit of pain with debridement last week I do believe it was beneficial but at the same time she is doing much better but still really does not want this debrided again I think being that it is looking a whole lot better I would try to avoid that today especially since it caused her so much discomfort that is really not the goal and I explained that to the [name] today she voiced understanding and knows that it needed to be done but still states that it was quite painful. 11-30-2021 upon evaluation today [name] appears to be doing excellent in regard to her wound this is actually showing signs of  excellent improvement I am very pleased with where things stand. She does have her venous ablation appointment for October 17. 12-21-2021 upon evaluation today [name] appears to be doing better in regard to her wound this is measuring smaller and looking better as well. Fortunately I do not see any signs of active infection locally or systemically which is great news. 01-01-2022 upon evaluation today [name] appears to be doing well currently in regard to her wound. She is showing signs of improvement which is great news and overall I do not see any signs of active infection locally or systemically at this time. 01-08-2022 upon evaluation today [name] appears to be doing well currently in regard to her wound she is actually showing signs of significant improvement which is great news. Fortunately I do not see any evidence of active infection locally or systemically at this time. I do believe that we are on the right track. She also has her appointment October 19 for the venous ablation. 01-16-2022 upon evaluation today [name]'s wound actually is showing some signs of improvement although this is very slow. Fortunately I do not see any evidence of infection at this time. The volume is a little bit more although the size is smaller I think this is due to the fact that we are slowly cleaning this area out effectively. 11/6 continued improvement the [name] is using Iodoflex and a Tubigrip E. She was supposed to have venous surgery at vein and vascular in [place] however somehow this is gotten delayed till December 14. 02-27-2022 upon evaluation today [name]'s wound is actually showing signs of being completely healed. Fortunately I do not see any evidence of active infection locally or systemically which is great news and overall I am extremely pleased with where we stand today. Unfortunately she does tell me that she postpone her venous ablation surgery until December 14 she tells me that she was not  ready "financially" for this. 04/18/2022; Ms. Amaiya Mapps is a 37 year old female with a past medical history of venous insufficiency that presents the clinic for a left lower extremity wound. She was seen almost 2 months ago for the same wound. This had healed with Iodoflex and Tubigrip. She states that the wound recently reopened. She has seen vein and vascular and plan is for ligation and stripping of the left great saphenous vein. She is not sure when this procedure is going to be scheduled. She has canceled it once before. She has had office compression wraps in the past however these do not stay on and create more of an issue for her. She would like to avoid this. She currently denies signs of infection. 1/10; [name] presents  for follow-up. She has been using Iodoflex to the wound bed. She states she has been using her Tubigrip however she does not have this on today. She has no issues or complaints today. She denies signs of infection. 1/17; [name] presents for follow-up. She has not been using Iodoflex to the wound bed. She reports acute pain. She denies increased warmth, erythema or purulent drainage to the left lower extremity. She states she has been using Tubigrip. She has information to order the compression stockings but has not obtained them. 1/24; [name] had a wound culture done at last clinic visit that grew Proteus mirabilis and E. coli. She was prescribed levofloxacin and this should cover both bacteria. She has been taking the medication over the past week with improvement of symptoms to the wound bed. She has been using Hydrofera Blue under Tubigrip. She states the [facility] is sticking to her wound bed. 1/31; [name] presents for follow-up. She has been using Medihoney to the wound bed with improvement in healing. She has been contacted by [facility] to order her antibiotic ointment. She is not sure yet if she is able to afford this. She received her compression stockings  but states they did not fit well. She is [name], [name] ([id number]) [id number] Page 4 of 10 working on returning these. She has been using Tubigrip. 2/7; [name] presents for follow-up. She is been using Medihoney to the wound bed. She obtained her compression stockings and has been using these daily. She states that the [facility] antibiotic ointment is arriving in the mail tomorrow. She has no issues or complaints today. She denies systemic signs of infection. 2/14; [name] presents for follow-up. She has been using [facility] antibiotic ointment to the wound bed. She states the [facility] is sticking and she has stopped this. She is been using her compression stockings daily. She does not have these on today. 2/28; [name] presents for follow-up. She continues to use [facility] antibiotic ointment to the wound bed. There is been improvement in healing. She has been using her compression stockings daily. 3/12; [name] presents for follow-up. She has been using Keystone antibiotic ointment to the wound bed. She is not wearing her compression stockings today but states she does wear them daily. She declines debridement today. 3/20; [name] presents for follow-up. She has been using Keystone antibiotic ointment with Hydrofera Blue. She states she is wearing her compression stockings daily but she does not have them on today. Electronic Signature(s) Signed: 07/05/2022 11:53:51 AM By: Kalman Shan DO Entered By: Kalman Shan on 07/04/2022 15:39:52 -------------------------------------------------------------------------------- Physical Exam Details [name] Name: Date of Service: [name] [name] [place]. 07/04/2022 3:00 PM Medical Record Number: [id number] [name] Account Number: [id number] Date of Birth/Sex: Treating RN: [birth year] (38 y.o. Orvan Falconer Primary Care Provider: [name] Other Clinician: Referring Provider: Treating Provider/Extender:  [name] in Treatment: 11 Constitutional . Cardiovascular . Psychiatric . Notes Left lower extremity: T the distal medial aspect there is an open wound with granulation tissue and nonviable tissue. No increased warmth, erythema or o purulent drainage. Stage III lymphedema Electronic Signature(s) Signed: 07/05/2022 11:53:51 AM By: Kalman Shan DO Entered By: Kalman Shan on 07/04/2022 15:40:11 -------------------------------------------------------------------------------- Physician Orders Details [name] Name: Date of Service: [name] Corporal MA J. 07/04/2022 3:00 PM Medical Record Number: [id number] [name] Account Number: [id number] Date of Birth/Sex: Treating RN: [birth year] (37 y.o. Orvan Falconer Primary Care Provider: [name] Other Clinician: Referring Provider: Treating Provider/Extender: [name] in Treatment:  11 Verbal / Phone Orders: No Diagnosis Coding Follow-up Appointments Wound #5 Left,Medial Lower Leg Return Appointment in 1 week. Bathing/ [employer] wounds with antibacterial soap and water. May shower with wound dressing protected with water repellent cover or cast protector. No tub bath. Edema Control - Lymphedema / Segmental Compressive Device / LAZAYA, PRISBREY ([id number]) [id number] Page 5 of 10 Wound #5 Left,Medial Lower Leg [name] to wear own compression stockings. Remove compression stockings every night before going to bed and put on every morning when getting up. - LLE, Elevate, Exercise Daily and A void Standing for Long Periods of Time. Elevate legs to the level of the heart and pump ankles as often as possible Elevate leg(s) parallel to the floor when sitting. Medications-Please add to medication list. Wound #5 Left,Medial Lower Leg Keystone Compound - [name] forgot keystone , new order to apply gent and mupirocin in clinic  today , [name] to change tomorrow and resume previous orders Wound Treatment Wound #5 - Lower Leg Wound Laterality: Left, Medial Cleanser: Soap and Water 1 x Per Day/30 Days Discharge Instructions: Gently cleanse wound with antibacterial soap, rinse and pat dry prior to dressing wounds Cleanser: Vashe 5.8 (oz) 1 x Per Day/30 Days Discharge Instructions: Use vashe 5.8 (oz) as directed Peri-Wound Care: AandD Ointment 1 x Per Day/30 Days Discharge Instructions: Apply AandD Ointment as directed around edges to help dressing from sticking Topical: keystone 1 x Per Day/30 Days Prim Dressing: Hydrofera Blue Ready Transfer Foam, 2.5x2.5 (in/in) 1 x Per Day/30 Days ary Discharge Instructions: Apply Hydrofera Blue Ready to wound bed as directed cut to fit wound bed Secondary Dressing: (BORDER) Zetuvit Plus SILICONE BORDER Dressing 4x4 (in/in) 1 x Per Day/30 Days Discharge Instructions: Please do not put silicone bordered dressings under wraps. Use non-bordered dressing only. Electronic Signature(s) Signed: 07/05/2022 11:53:51 AM By: Kalman Shan DO Signed: 07/06/2022 11:27:58 AM By: Carlene Coria RN Entered By: Carlene Coria on 07/04/2022 16:44:01 -------------------------------------------------------------------------------- Problem List Details [name] Name: Date of Service: [name] Corporal MA J. 07/04/2022 3:00 PM Medical Record Number: [id number] [name] Account Number: [id number] Date of Birth/Sex: Treating RN: Mar 25, 1986 (37 y.o. 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 07/04/2022 3:00 PM Medical Record Number: [id number] [name] Account Number: [id number] Date of Birth/Sex: Treating RN: [birth year] (37 y.o. Orvan Falconer Primary Care Provider: [name] Other Clinician: Referring Provider: Treating Provider/Extender: [name] in Treatment: 11 Subjective Chief Complaint Information obtained from [name] 04/18/2022; Left LE Ulcer History of Present Illness (HPI) 37 year old [name] who started with having ulcerations on the right lower leg on the lateral part of her ankle for about 2 weeks. She was seen in the ER at [facility] and advised to see the wound care for a consultation. No X-rays of workup was done during the ER visit and  no prescription for any medications of compression wraps were given. the [name] is not diabetic but does have hypertension and her medications have been reviewed by me. 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 The right and left greater saphenous vein was found to be tortuous. Deep venous system was also not competent and there was reflux of more than 500 ms. She was then seen by Dr. Darene Lamer Early who recommended that the [name] would not benefit from endovenous ablation and he had recommended vein stripping odd on the right  side and multiple small phlebectomy procedures on the left side. the [name] did not follow-up due to social economic reasons. She has not been wearing any compression stockings and has not taken any specific treatment for varicose veins for the last 3 years. 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 We have obtained some appointments to see the vascular surgeons by the end of the month and the [name] would like to follow up with me at my [facility] on Wednesday, June 29. 10/14/2014 -- she could not see me yesterday in [place] and hence has come for a review today. She has a vascular workup to be done this afternoon at [facility]. She is doing fine otherwise. 10/22/2014 -- she was seen by Dr. Althea Charon and he has recommended surgical removal of her right saphenous vein from distal thigh to saphenofemoral odd junction and stab phlebectomy's of multiple large tributary branches throughout her thigh and calf. This would be done under general anesthesia in the outpatient setting. 10/29/2014 -- she is trying to work on a surgical date and in the meanwhile we have got insurance clearance for Apligraf and we will start this next week. 7/22 2016 -- she is here for the first application of Apligraf. 11/19/2014 -- she is here for a second application of Apligraf 11/26/2014 -- she has done fine after her last application of Apligraf and is awaiting her surgery which is scheduled for August 31. 12/03/2014 -- she is doing fine and is here for her third application of Apligraf. 12/21/2014 -- She had surgery on 12/15/2014 by Dr.Early who did #1 ligation and stripping of right great saphenous vein from distal thigh to saphenofemoral junction, #2 stab phlebectomy of large tributary varicose veins in the thigh popliteal space and calf. She had an Ace wrap up to her groin and this was removed today and the  Unna's boot was also removed. 12/28/2014 -- she is here for her fourth application of Apligraf. 01/06/2015 - he saw her vascular surgeon Dr. Donnetta Hutching who was pleased with her progress and he has confirmed that no surgical procedures could be attempted on the left side. 01/13/2015 -- her wound looks very good and she's been having no problems whatsoever. Readmission: 07/26/2020 upon evaluation today [name] presents for initial inspection here in our clinic for a new issue with her left leg although she is previously been seen due to issues with the right leg back in 2016. At that time she was seeing Dr. Donnetta Hutching who is a vein/vascular specialist in [place]. He has since semiretired from what I understand. He is working out of [employer] I believe. Nonetheless she tells me at the time that there was really nothing  to do for her left leg although the right leg was where they did most of the work. Subsequently she states that she is done fairly well until just in the past week where she had issues with bleeding from what appears to be varicose vein on the left leg medially. Unfortunately this has continued to be an issue although she tells me at first it was coming much more significantly Down quite a bit but nonetheless has not completely resolved. Every time she showers she notices that it starts to drain a little bit more. She does have a history of chronic venous insufficiency, lymphedema, varicose veins bilaterally, and obesity. 08/02/2020 upon evaluation today [name] appears to be doing about the same in regards to the ulcer on her left leg. She has some eschar covering there is definitely some fluid collecting underneath unfortunately. With that being said she tells me she is still having a tremendous amount of pain therefore she is really not able to allow me to clean this off very effectively to be perfectly honest. I think we need to try to soften this up 08/16/2020 upon evaluation today [name]'s  wound is really not doing significantly better not really states about the same. She notes that the wrap just does not seem to be staying up very well at all unfortunately. No fevers, chills, nausea, vomiting, or diarrhea. She did cut it off once it starts to slide in order to alleviate some of the pressure from sliding Down. Fortunately there is no signs of active infection at this time which is great news. 08/23/2020 upon evaluation today [name] appears to be doing well 08/23/2020 upon evaluation today [name] appears to be doing well with regard to her wound all things considered. Fortunately there does not appear to be any signs of active infection at this time which is great news. She has been tolerating the dressing changes without complication and overall I am extremely pleased with where things stand at this point. She does have her appointment with vascular in [facility] on June 9. 08/30/2020 upon evaluation today [name] actually appears to be doing decently well in regard to her wound. Fortunately there is no signs of active infection which is great news. Nonetheless I do believe that the [name] is going require little bit of debridement if she is okay with me attempting that today I think that will help clean off some of the necrotic tissue. Fortunately there does not appear to be otherwise any evidence of active infection which is also great news. RAEA, GRATZER ([id number]) [id number] Page 7 of 10 09/19/2020 upon evaluation today [name] appears to be doing a little better in regard to her wound as compared to previous. Fortunately there does not appear to be any signs of active infection overall. No fever chills noted. I do believe that the Iodosorb has made this a little bit better with regard to the overall size and appearance of the wound bed though again she does still have quite a ways to go to get this to heal she still very tender to touch. 09/27/2020  upon evaluation today [name] appears to actually be doing quite well with regard to her wound. This is measuring much smaller which is great news. With that being said she did see vein and vascular in [facility] and they subsequently recommended that surgery is really what she probably needs to go forward with sounds like the potential for venous ablation. With that being said the [name] tells me this is just  not the right time for her to be able to proceed with any type of surgery which I completely understand. Nonetheless I do believe that she would continue to benefit from compression but again that is really not something that she is able to easily do. 10/04/2020 upon evaluation today [name] appears to be doing about the same in regard to her wound. This is measuring a little bit smaller but nonetheless still is open and again has some slough and biofilm noted on the surface of the wound. There does not appear to be any signs of active infection which is great news. No fevers, chills, nausea, vomiting, or diarrhea. 10/04/2020 upon evaluation today [name] appears to be doing well with regard to her wound. She has been tolerating dressing changes without complication. Fortunately there does not appear to be any signs of active infection which is great news. No fevers, chills, nausea, vomiting, or diarrhea. 10/25/2020 upon evaluation today [name] appears to be doing well with regard to her wound. She has been tolerating the dressing changes without complication. Fortunately there is no signs of active infection at this time. No fevers, chills, nausea, vomiting, or diarrhea. 11/01/2020 upon evaluation today [name] with regard to her wound. She has been tolerating the dressing changes without complication. Fortunately there does not appear to be any signs of infection which is great news. No fever chills noted 11/15/2020 upon evaluation today [name] appears to be doing well with regard to her wound.  Fortunately there is no signs of active infection at this time. No fevers, chills, nausea, vomiting, or diarrhea. With that being said she continues to have a significant amount of pain at the site even though this is very close to complete closure. She also had several varicose veins around the area which were also problematic. Overall however I feel like the [name] is making excellent progress. 11/28/2020 upon evaluation today [name] appears to be doing well with regard to her leg ulcer. Again were not really able to debride or compression wrap her due to discomfort and pain. She does not allow for that. With that being said we have been using Iodosorb which does seem to be doing decently well. Fortunately there is no signs of active infection at this time which is great news. No fevers, chills, nausea, vomiting, or diarrhea. 8/31; [name] presents for 2-week follow-up. She has been using Iodosorb. She reports that the wound is closed. She denies signs of infection. Readmission: 10-27-2021 upon evaluation this is a [name] that presents today whom I have previously seen this is pretty much for the same issue though I think a little bit higher than the last time I saw her. She does have a history of chronic venous insufficiency and hypertension along with varicose veins. Subsequently she does have an ulceration which spontaneously ruptured she has not been wearing any compression which I think is a big part of the issue here. We discussed this before I really think she probably needs to be wearing her compression therapy, she probably needs lymphedema pumps if she can ever wear the compression for a significant amount of time to get these, and subsequently also think that she needs to be elevating her legs is much as possible she may even need some vascular intervention in regard to her veins. All of this was reiterated and discussed with her today to reinforce what needs to happen in order to ensure  that her legs do not get a lot worse. The [name] voiced understanding. She  tells me that she knows because she is seeing her mom go through a lot of this as well how bad things can get. 11-03-2021 upon evaluation today [name] appears to be doing well with regard to her wound. Fortunately there does not appear to be any signs of active infection at this time. She is measuring a little bit bigger but I think this is because the wound is actually cleaning up a bit here. 7/27; left lateral leg wound not any smaller but perhaps with a cleaner surface. She is using Iodoflex to help with the latter and using Tubigrip. She has chronic venous insufficiency with secondary lymphedema. She is apparently followed by vein and vascular and is being scheduled for an ablation 11-17-2021 upon evaluation today [name] appears to be doing well with regard to her wound this is actually showing signs of improvement which is great news. Fortunately I do not see any evidence of active infection locally or systemically at this time which is great news. No fevers, chills, nausea, vomiting, or diarrhea. 11-24-2021 upon evaluation today [name]'s wound is actually showing signs of significant improvement. Unfortunately she had quite a bit of pain with debridement last week I do believe it was beneficial but at the same time she is doing much better but still really does not want this debrided again I think being that it is looking a whole lot better I would try to avoid that today especially since it caused her so much discomfort that is really not the goal and I explained that to the [name] today she voiced understanding and knows that it needed to be done but still states that it was quite painful. 11-30-2021 upon evaluation today [name] appears to be doing excellent in regard to her wound this is actually showing signs of excellent improvement I am very pleased with where things stand. She does have her venous ablation  appointment for October 17. 12-21-2021 upon evaluation today [name] appears to be doing better in regard to her wound this is measuring smaller and looking better as well. Fortunately I do not see any signs of active infection locally or systemically which is great news. 01-01-2022 upon evaluation today [name] appears to be doing well currently in regard to her wound. She is showing signs of improvement which is great news and overall I do not see any signs of active infection locally or systemically at this time. 01-08-2022 upon evaluation today [name] appears to be doing well currently in regard to her wound she is actually showing signs of significant improvement which is great news. Fortunately I do not see any evidence of active infection locally or systemically at this time. I do believe that we are on the right track. She also has her appointment October 19 for the venous ablation. 01-16-2022 upon evaluation today [name]'s wound actually is showing some signs of improvement although this is very slow. Fortunately I do not see any evidence of infection at this time. The volume is a little bit more although the size is smaller I think this is due to the fact that we are slowly cleaning this area out effectively. 11/6 continued improvement the [name] is using Iodoflex and a Tubigrip E. She was supposed to have venous surgery at vein and vascular in [place] however somehow this is gotten delayed till December 14. 02-27-2022 upon evaluation today [name]'s wound is actually showing signs of being completely healed. Fortunately I do not see any evidence of active infection locally or systemically which is  great news and overall I am extremely pleased with where we stand today. Unfortunately she does tell me that she postpone her venous ablation surgery until December 14 she tells me that she was not ready "financially" for this. 04/18/2022; Ms. Myiah Vineyard is a 37 year old female with a past  medical history of venous insufficiency that presents the clinic for a left lower extremity wound. She was seen almost 2 months ago for the same wound. This had healed with Iodoflex and Tubigrip. She states that the wound recently reopened. She has seen vein and vascular and plan is for ligation and stripping of the left great saphenous vein. She is not sure when this procedure is going to be scheduled. She has canceled it once before. She has had office compression wraps in the past however these do not stay on and create more of an issue for her. She would like to avoid this. She currently denies signs of infection. 1/10; [name] presents for follow-up. She has been using Iodoflex to the wound bed. She states she has been using her Tubigrip however she does not have this on today. She has no issues or complaints today. She denies signs of infection. [name], [name] ([id number]) [id number] Page 8 of 10 1/17; [name] presents for follow-up. She has not been using Iodoflex to the wound bed. She reports acute pain. She denies increased warmth, erythema or purulent drainage to the left lower extremity. She states she has been using Tubigrip. She has information to order the compression stockings but has not obtained them. 1/24; [name] had a wound culture done at last clinic visit that grew Proteus mirabilis and E. coli. She was prescribed levofloxacin and this should cover both bacteria. She has been taking the medication over the past week with improvement of symptoms to the wound bed. She has been using Hydrofera Blue under Tubigrip. She states the [facility] is sticking to her wound bed. 1/31; [name] presents for follow-up. She has been using Medihoney to the wound bed with improvement in healing. She has been contacted by [facility] to order her antibiotic ointment. She is not sure yet if she is able to afford this. She received her compression stockings but states  they did not fit well. She is working on returning these. She has been using Tubigrip. 2/7; [name] presents for follow-up. She is been using Medihoney to the wound bed. She obtained her compression stockings and has been using these daily. She states that the [facility] antibiotic ointment is arriving in the mail tomorrow. She has no issues or complaints today. She denies systemic signs of infection. 2/14; [name] presents for follow-up. She has been using Keystone antibiotic ointment to the wound bed. She states the [facility] is sticking and she has stopped this. She is been using her compression stockings daily. She does not have these on today. 2/28; [name] presents for follow-up. She continues to use [facility] antibiotic ointment to the wound bed. There is been improvement in healing. She has been using her compression stockings daily. 3/12; [name] presents for follow-up. She has been using Keystone antibiotic ointment to the wound bed. She is not wearing her compression stockings today but states she does wear them daily. She declines debridement today. 3/20; [name] presents for follow-up. She has been using Keystone antibiotic ointment with Hydrofera Blue. She states she is wearing her compression stockings daily but she does not have them on today. Objective Constitutional Vitals Time Taken: 3:06 PM, Height: 66 in, Weight: 400 lbs,  BMI: 64.6, Temperature: 98.1 F, Pulse: 93 bpm, Respiratory Rate: 18 breaths/min, Blood Pressure: 129/85 mmHg. General Notes: Left lower extremity: T the distal medial aspect there is an open wound with granulation tissue and nonviable tissue. No increased warmth, o erythema or purulent drainage. Stage III lymphedema Integumentary (Hair, Skin) Wound #5 status is Open. Original cause of wound was Gradually Appeared. The date acquired was: 04/10/2022. The wound has been in treatment 11 weeks. The wound is located on the Left,Medial Lower Leg. The wound  measures 1.5cm length x 3cm width x 0.2cm depth; 3.534cm^2 area and 0.707cm^3 volume. There is Fat Layer (Subcutaneous Tissue) exposed. There is no tunneling or undermining noted. There is a medium amount of serosanguineous drainage noted. There is medium (34-66%) red granulation within the wound bed. There is a medium (34-66%) amount of necrotic tissue within the wound bed including Adherent Slough. Assessment Active Problems ICD-10 Chronic venous hypertension (idiopathic) with ulcer of left lower extremity Non-pressure chronic ulcer of other part of left lower leg with fat layer exposed Lymphedema, not elsewhere classified [name]'s wound is stable. Not sure if she is wearing her compression stockings or not. Either way she does not have adequate edema control. Unfortunately that she does not do well with her compression wraps in office. Recommend she elevate her legs when sitting and using her compression stockings daily. I debrided nonviable tissue. I recommended continuing the course with Keystone antibiotic ointment and Hydrofera Blue. No signs of infection. Follow-up in 1 week. Procedures Wound #5 Pre-procedure diagnosis of Wound #5 is a Venous Leg Ulcer located on the Left,Medial Lower Leg .Severity of Tissue Pre Debridement is: Fat layer exposed. There was a Selective/Open Wound Non-Viable Tissue Debridement with a total area of 2.25 sq cm performed by Kalman Shan, MD. With the following instrument(s): Curette to remove Viable and Non-Viable tissue/material. Material removed includes [place]. No specimens were taken. A time out was conducted at 15:35, prior to the start of the procedure. A Minimum amount of bleeding was controlled with N/A. The procedure was tolerated well with a pain level of 0 throughout and a pain level of 0 following the procedure. Post Debridement Measurements: 1.5cm length x 3cm width x 0.2cm depth; 0.707cm^3 volume. Character of Wound/Ulcer Post Debridement  is improved. Severity of Tissue Post Debridement is: Fat layer exposed. Post procedure Diagnosis Wound #5: Same as Pre-Procedure [name], [name] ([id number]) [id number] Page 9 of 10 Plan Follow-up Appointments: Wound #5 Left,Medial Lower Leg: Return Appointment in 1 week. Bathing/ Shower/ Hygiene: Wash wounds with antibacterial soap and water. May shower with wound dressing protected with water repellent cover or cast protector. No tub bath. Edema Control - Lymphedema / Segmental Compressive Device / Other: Wound #5 Left,Medial Lower Leg: [name] to wear own compression stockings. Remove compression stockings every night before going to bed and put on every morning when getting up. - LLE, pt made MD Mellina Benison aware she had her own stockings to wear Elevate, Exercise Daily and Avoid Standing for Long Periods of Time. Elevate legs to the level of the heart and pump ankles as often as possible Elevate leg(s) parallel to the floor when sitting. Medications-Please add to medication list.: Wound #5 Left,Medial Lower Leg: Keystone Compound WOUND #5: - Lower Leg Wound Laterality: Left, Medial Cleanser: Soap and Water 1 x Per Day/30 Days Discharge Instructions: Gently cleanse wound with antibacterial soap, rinse and pat dry prior to dressing wounds Cleanser: Vashe 5.8 (oz) 1 x Per Day/30 Days Discharge Instructions: Use vashe  5.8 (oz) as directed Peri-Wound Care: AandD Ointment 1 x Per Day/30 Days Discharge Instructions: Apply AandD Ointment as directed around edges to help dressing from sticking Topical: keystone 1 x Per Day/30 Days Prim Dressing: Hydrofera Blue Ready Transfer Foam, 2.5x2.5 (in/in) 1 x Per Day/30 Days ary Discharge Instructions: Apply Hydrofera Blue Ready to wound bed as directed cut to fit wound bed Secondary Dressing: (BORDER) Zetuvit Plus SILICONE BORDER Dressing 4x4 (in/in) 1 x Per Day/30 Days Discharge Instructions: Please do not put silicone  bordered dressings under wraps. Use non-bordered dressing only. 1. In office sharp debridement 2. Hydrofera Blue and Keystone antibiotic ointment 3. Follow-up in 1 week 4. Compression stockings daily Electronic Signature(s) Signed: 07/05/2022 11:53:51 AM By: Kalman Shan DO Entered By: Kalman Shan on 07/04/2022 15:41:16 -------------------------------------------------------------------------------- SuperBill Details [name] Name: Date of Service: [name] Corporal MA J. 07/04/2022 Medical Record Number: [id number] [name] Account Number: [id number] Date of Birth/Sex: Treating RN: 03/21/1986 (37 y.o. Orvan Falconer Primary Care Provider: [name] Other Clinician: Referring Provider: Treating Provider/Extender: [name] in Treatment: 11 Diagnosis Coding ICD-10 Codes Code Description [phone] Chronic venous hypertension (idiopathic) with ulcer of left lower extremity L97.822 Non-pressure chronic ulcer of other part of left lower leg with fat layer exposed I89.0 Lymphedema, not elsewhere classified Facility Procedures Physician Procedures : CPT4 Code Description Modifier [id number] - WC PHYS DEBR WO ANESTH 20 SQ CM ICD-10 Diagnosis Description I87.312 Chronic venous hypertension (idiopathic) with ulcer of left lower extremity L97.822 Non-pressure chronic ulcer of other part of left  lower leg with fat layer exposed Quantity: 1 Electronic Signature(s) Signed: 07/05/2022 11:53:51 AM By: Kalman Shan DO Entered By: Kalman Shan on 07/04/2022 15:41:26

## 2022-07-07 NOTE — Progress Notes (Signed)
[name], [name] ([id number]) [id number] Page 1 of 7 Visit Report for 07/04/2022 Arrival Information Details Patient Name: Date of Service: [name], [name] [place]. 07/04/2022 3:00 PM Medical Record Number: [id number] Patient Account Number: [id number] Date of Birth/Sex: Treating RN: 08-12-1985 (37 y.o. [name] Primary Care [name]: [name] Other Clinician: Referring [name]: Treating [name]/Extender: [name] in Treatment: 11 Visit Information History Since Last Visit All ordered tests and consults were completed: No Patient Arrived: Ambulatory Added or deleted any medications: No Arrival Time: 15:03 Any new allergies or adverse reactions: No Accompanied By: self Had a fall or experienced change in No Transfer Assistance: None activities of daily living that may affect Patient Identification Verified: Yes risk of falls: Secondary Verification Process Completed: Yes Signs or symptoms of abuse/neglect since last visito No Patient Requires Transmission-Based Precautions: No Hospitalized since last visit: No Patient Has Alerts: No Implantable device outside of the clinic excluding No cellular tissue based products placed in the center since last visit: Has Dressing in Place as Prescribed: Yes Pain Present Now: No Electronic Signature(s) Signed: 07/06/2022 11:27:58 AM By: [name] Coria RN Entered By: [name] on 07/04/2022 15:06:52 -------------------------------------------------------------------------------- Clinic Level of Care Assessment Details Patient Name: Date of Service: [name], [name] [place] J. 07/04/2022 3:00 PM Medical Record Number: [id number] Patient Account Number: [id number] Date of Birth/Sex: Treating RN: Jun 13, 1985 (37 y.o. [name] Primary Care [name]: [name] Other Clinician: Referring [name]: Treating [name]/Extender: [name] in  Treatment: 11 Clinic Level of Care Assessment Items TOOL 1 Quantity Score []  - 0 Use when EandM and Procedure is performed on INITIAL visit ASSESSMENTS - Nursing Assessment / Reassessment []  - 0 General Physical Exam (combine w/ comprehensive assessment (listed just below) when performed on new pt. evals) []  - 0 Comprehensive Assessment (HX, ROS, Risk Assessments, Wounds Hx, etc.) ASSESSMENTS - Wound and Skin Assessment / Reassessment []  - 0 Dermatologic / Skin Assessment (not related to wound area) ASSESSMENTS - Ostomy and/or Continence Assessment and Care []  - 0 Incontinence Assessment and Management []  - 0 Ostomy Care Assessment and Management (repouching, etc.) PROCESS - Coordination of Care []  - 0 Simple Patient / Family Education for ongoing care []  - 0 Complex (extensive) Patient / Family Education for ongoing care []  - 0 Staff obtains Programmer, systems, Records, T Results / Process Orders est []  - 0 Staff telephones HHA, Nursing Homes / Clarify orders / etc []  - 0 Routine Transfer to another Facility (non-emergent condition) []  - 0 Routine Hospital Admission (non-emergent condition) [name], [name] ([id number]) [id number] Page 2 of 7 []  - 0 New Admissions / Biomedical engineer / Ordering NPWT Apligraf, etc. , []  - 0 Emergency Hospital Admission (emergent condition) PROCESS - Special Needs []  - 0 Pediatric / Minor Patient Management []  - 0 Isolation Patient Management []  - 0 Hearing / Language / Visual special needs []  - 0 Assessment of Community assistance (transportation, D/C planning, etc.) []  - 0 Additional assistance / Altered mentation []  - 0 Support Surface(s) Assessment (bed, cushion, seat, etc.) INTERVENTIONS - Miscellaneous []  - 0 External ear exam []  - 0 Patient Transfer (multiple staff / Civil Service fast streamer / Similar devices) []  - 0 Simple Staple / Suture removal (25 or less) []  - 0 Complex Staple / Suture removal (26 or  more) []  - 0 Hypo/Hyperglycemic Management (do not check if billed separately) []  - 0 Ankle / Brachial Index (ABI) - do not check if billed separately Has the patient been  seen at the hospital within the last three years: Yes Total Score: 0 Level Of Care: ____ Electronic Signature(s) Signed: 07/06/2022 11:27:58 AM By: [name] Coria RN Entered By: [name] on 07/04/2022 16:44:25 -------------------------------------------------------------------------------- Encounter Discharge Information Details Patient Name: Date of Service: [name], [name] 07/04/2022 3:00 PM Medical Record Number: [id number] Patient Account Number: [id number] Date of Birth/Sex: Treating RN: 08/15/1985 (38 y.o. [name] Primary Care Margurette Brener: [name] Other Clinician: Referring Sharnetta Gielow: Treating Tamberlyn Midgley/Extender: [name] in Treatment: 11 Encounter Discharge Information Items Post Procedure Vitals Discharge Condition: Stable Temperature (F): 98.1 Ambulatory Status: Ambulatory Pulse (bpm): 93 Discharge Destination: Home Respiratory Rate (breaths/min): 18 Transportation: Private Auto Blood Pressure (mmHg): 129/85 Accompanied By: self Schedule Follow-up Appointment: Yes Clinical Summary of Care: Electronic Signature(s) Signed: 07/04/2022 4:45:15 PM By: [name] Coria RN Entered By: [name] on 07/04/2022 16:45:14 -------------------------------------------------------------------------------- Lower Extremity Assessment Details Patient Name: Date of Service: [name], CHESTNUTT MA J. 07/04/2022 3:00 PM Medical Record Number: [id number] Patient Account Number: [id number] Date of Birth/Sex: Treating RN: 21-Jul-1985 (37 y.o. [name] Primary Care Kyia Rhude: [name] Other Clinician: Referring Rakel Junio: Treating Bryann Gentz/Extender: [name] in Treatment: 11 Edema Assessment S[Left: Frederik Pear [id number] Patrice [id number] Page 3 of 7] Assessed: [Left: No] [Right: No] Edema: [Left: Ye] [Right: s] Calf Left: Right: Point of Measurement: 32 cm From Medial Instep 57 cm Ankle Left: Right: Point of Measurement: 12 cm From Medial Instep 32 cm Electronic Signature(s) Signed: 07/06/2022 11:27:58 AM By: [name] Coria RN Entered By: [name] on 07/04/2022 15:14:22 -------------------------------------------------------------------------------- Multi Wound Chart Details Patient Name: Date of Service: [name] Corporal MA J. 07/04/2022 3:00 PM Medical Record Number: [id number] Patient Account Number: [id number] Date of Birth/Sex: Treating RN: October 16, 1985 (37 y.o. [name] Primary Care Zakaree Mcclenahan: [name] Other Clinician: Referring Shaniya Tashiro: Treating Annakate Soulier/Extender: [name] in Treatment: 11 Vital Signs Height(in): 66 Pulse(bpm): 93 Weight(lbs): 400 Blood Pressure(mmHg): 129/85 Body Mass Index(BMI): 64.6 Temperature(F): 98.1 Respiratory Rate(breaths/min): 18 [5:Photos:] [N/A:N/A] Left, Medial Lower Leg N/A N/A Wound Location: Gradually Appeared N/A N/A Wounding Event: Venous Leg Ulcer N/A N/A Primary Etiology: Hypertension, Peripheral Venous N/A N/A Comorbid History: Disease 04/10/2022 N/A N/A Date Acquired: 11 N/A N/A Weeks of Treatment: Open N/A N/A Wound Status: No N/A N/A Wound Recurrence: 1.5x3x0.2 N/A N/A Measurements L x W x D (cm) 3.534 N/A N/A A (cm) : rea 0.707 N/A N/A Volume (cm) : 35.70% N/A N/A % Reduction in Area: 35.70% N/A N/A % Reduction in Volume: Full Thickness Without Exposed N/A N/A Classification: Support Structures Medium N/A N/A Exudate Amount: Serosanguineous N/A N/A Exudate Type: red, brown N/A N/A Exudate Color: Medium (34-66%) N/A N/A Granulation Amount: Red N/A N/A Granulation Quality: Medium (34-66%) N/A N/A Necrotic Amount: Fat Layer (Subcutaneous Tissue): Yes  N/A N/A Exposed Structures: Fascia: No Tendon: No Muscle: No Joint: No Bone: No Small (1-33%) N/A N/A EpithelializationEDONA, RINEY ([id number]) [id number] Page 4 of 7 Treatment Notes Electronic Signature(s) Signed: 07/06/2022 11:27:58 AM By: [name] Coria RN Entered By: [name] on 07/04/2022 15:14:26 -------------------------------------------------------------------------------- Multi-Disciplinary Care Plan Details Patient Name: Date of Service: [name] Corporal MA J. 07/04/2022 3:00 PM Medical Record Number: [id number] Patient Account Number: [id number] Date of Birth/Sex: Treating RN: Jan 18, 1986 (37 y.o. [name] Primary Care Shanavia Makela: [name] Other Clinician: Referring Trinita Devlin: Treating Laakea Pereira/Extender: [name] in Treatment: 11 Active Inactive Wound/Skin Impairment Nursing Diagnoses: Knowledge deficit related to ulceration/compromised skin integrity Goals:  Patient/caregiver will verbalize understanding of skin care regimen Date Initiated: 04/18/2022 Target Resolution Date: 07/18/2022 Goal Status: Active Ulcer/skin breakdown will have a volume reduction of 30% by week 4 Date Initiated: 04/18/2022 Date Inactivated: 07/04/2022 Target Resolution Date: 05/19/2022 Goal Status: Unmet Unmet Reason: comorbidities Ulcer/skin breakdown will have a volume reduction of 50% by week 8 Date Initiated: 04/18/2022 Date Inactivated: 07/04/2022 Target Resolution Date: 06/18/2022 Goal Status: Unmet Unmet Reason: comorbidities Ulcer/skin breakdown will have a volume reduction of 80% by week 12 Date Initiated: 04/18/2022 Target Resolution Date: 07/18/2022 Goal Status: Active Ulcer/skin breakdown will heal within 14 weeks Date Initiated: 04/18/2022 Target Resolution Date: 08/17/2022 Goal Status: Active Interventions: Assess patient/caregiver ability to obtain necessary supplies Assess patient/caregiver ability to perform  ulcer/skin care regimen upon admission and as needed Assess ulceration(s) every visit Notes: Electronic Signature(s) Signed: 07/06/2022 11:27:58 AM By: [name] Coria RN Entered By: [name] on 07/04/2022 15:15:28 -------------------------------------------------------------------------------- Pain Assessment Details Patient Name: Date of Service: [name], HILDENBRANDT MA J. 07/04/2022 3:00 PM Medical Record Number: [id number] Patient Account Number: [id number] Date of Birth/Sex: Treating RN: [birth year] (37 y.o. [name] Primary Care Brieana Shimmin: [name] Other Clinician: Referring Del Wiseman: Treating Jaquari Reckner/Extender: [name] in Treatment: 11 Active Problems Location of Pain Severity and Description of Pain Patient Has Paino No Site Locations ALFHILD, FEINER ([id number]) [id number] Page 5 of 7 Pain Management and Medication Current Pain Management: Electronic Signature(s) Signed: 07/06/2022 11:27:58 AM By: [name] Coria RN Entered By: [name] on 07/04/2022 15:07:18 -------------------------------------------------------------------------------- Patient/Caregiver Education Details Patient Name: Date of Service: [name] Corporal MA Lenna Sciara 3/20/2024andnbsp3:00 PM Medical Record Number: [id number] Patient Account Number: [id number] Date of Birth/Gender: Treating RN: January 08, 1986 (37 y.o. [name] Primary Care Physician: [name] Other Clinician: Referring Physician: Treating Physician/Extender: [name] in Treatment: 11 Education Assessment Education Provided To: Patient Education Topics Provided Wound/Skin Impairment: Methods: Explain/Verbal Responses: State content correctly [employer]) Signed: 07/06/2022 11:27:58 AM By: [name] Coria RN Entered By: [name] on 07/04/2022  15:15:42 -------------------------------------------------------------------------------- Wound Assessment Details Patient Name: Date of Service: [name], CORRADO MA J. 07/04/2022 3:00 PM Medical Record Number: [id number] Patient Account Number: [id number] Date of Birth/Sex: Treating RN: 06-Nov-1985 (37 y.o. [name] Primary Care Ameir Faria: [name] Other Clinician: Referring Earnie Bechard: Treating Rondey Fallen/Extender: [name] in Treatment: 11 Wound Status Wound Number: 5 Primary Etiology: Venous Leg Ulcer Wound Location: Left, Medial Lower Leg Wound Status: Open Wounding Event: Gradually Appeared Comorbid History: Hypertension, Peripheral Venous Disease Date Acquired: 04/10/2022 Weeks Of Treatment: 11 Clustered Wound: No [name], [name] ([id number]) [id number] Page 6 of 7 Photos Wound Measurements Length: (cm) 1.5 Width: (cm) 3 Depth: (cm) 0.2 Area: (cm) 3.534 Volume: (cm) 0.707 % Reduction in Area: 35.7% % Reduction in Volume: 35.7% Epithelialization: Small (1-33%) Tunneling: No Undermining: No Wound Description Classification: Full Thickness Without Exposed Support Structures Exudate Amount: Medium Exudate Type: Serosanguineous Exudate Color: red, brown Foul Odor After Cleansing: No Slough/Fibrino Yes Wound Bed Granulation Amount: Medium (34-66%) Exposed Structure Granulation Quality: Red Fascia Exposed: No Necrotic Amount: Medium (34-66%) Fat Layer (Subcutaneous Tissue) Exposed: Yes Necrotic Quality: Adherent Slough Tendon Exposed: No Muscle Exposed: No Joint Exposed: No Bone Exposed: No Treatment Notes Wound #5 (Lower Leg) Wound Laterality: Left, Medial Cleanser Soap and Water Discharge Instruction: Gently cleanse wound with antibacterial soap, rinse and pat dry prior to dressing wounds Vashe 5.8 (oz) Discharge Instruction: Use vashe 5.8 (oz) as directed [place] Discharge Instruction: Apply  AandD Ointment as directed around edges to help dressing from sticking Topical keystone Primary Dressing Hydrofera Blue Ready Transfer Foam, 2.5x2.5 (in/in) Discharge Instruction: Apply Hydrofera Blue Ready to wound bed as directed cut to fit wound bed Secondary Dressing (BORDER) [place] Dressing 4x4 (in/in) Discharge Instruction: Please do not put silicone bordered dressings under wraps. Use non-bordered dressing only. Secured With Compression Wrap Compression Stockings Environmental education officer) Signed: 07/06/2022 11:27:58 AM By: [name] Coria RN Entered By: [name] on 07/04/2022 15:13:44 Himmelberger, Herminio Heads ([id number]) [id number] Page 7 of 7 -------------------------------------------------------------------------------- Vitals Details Patient Name: Date of Service: [name], [name] [place] J. 07/04/2022 3:00 PM Medical Record Number: [id number] Patient Account Number: [id number] Date of Birth/Sex: Treating RN: 1985-07-04 (37 y.o. [name] Primary Care Leanah Kolander: [name] Other Clinician: Referring Ritchard Paragas: Treating Tahjae Durr/Extender: [name] in Treatment: 11 Vital Signs Time Taken: 15:06 Temperature (F): 98.1 Height (in): 66 Pulse (bpm): 93 Weight (lbs): 400 Respiratory Rate (breaths/min): 18 Body Mass Index (BMI): 64.6 Blood Pressure (mmHg): 129/85 Reference Range: 80 - 120 mg / dl Electronic Signature(s) Signed: 07/06/2022 11:27:58 AM By: [name] Coria RN Entered By: [name] on 07/04/2022 15:07:11

## 2022-07-11 ENCOUNTER — Encounter (HOSPITAL_BASED_OUTPATIENT_CLINIC_OR_DEPARTMENT_OTHER): Payer: BC Managed Care – PPO | Admitting: Internal Medicine

## 2022-07-11 DIAGNOSIS — I890 Lymphedema, not elsewhere classified: Secondary | ICD-10-CM | POA: Diagnosis not present

## 2022-07-11 DIAGNOSIS — L97822 Non-pressure chronic ulcer of other part of left lower leg with fat layer exposed: Secondary | ICD-10-CM

## 2022-07-11 DIAGNOSIS — I87312 Chronic venous hypertension (idiopathic) with ulcer of left lower extremity: Secondary | ICD-10-CM

## 2022-07-11 NOTE — Progress Notes (Signed)
RASHEEDA, RADU ([id number]) [id number] Page 1 of 7 Visit Report for 07/11/2022 Arrival Information Details Patient Name: Date of Service: NAYDEEN, WELK [place]. 07/11/2022 3:45 PM Medical Record Number: [id number] Patient Account Number: [id number] Date of Birth/Sex: Treating RN: 04/12/1986 (37 y.o. Orvan Falconer Primary Care Alejandro Adcox: [name] Other Clinician: Referring Zacchary Pompei: Treating Yarely Bebee/Extender: [name] in Treatment: 12 Visit Information History Since Last Visit Added or deleted any medications: No Patient Arrived: Ambulatory Any new allergies or adverse reactions: No Arrival Time: 15:57 Had a fall or experienced change in No Accompanied By: self activities of daily living that may affect Transfer Assistance: None risk of falls: Patient Identification Verified: Yes Signs or symptoms of abuse/neglect since last visito No Secondary Verification Process Completed: Yes Hospitalized since last visit: No Patient Requires Transmission-Based Precautions: No Implantable device outside of the clinic excluding No Patient Has Alerts: No cellular tissue based products placed in the center since last visit: Has Dressing in Place as Prescribed: No Has Compression in Place as Prescribed: No Pain Present Now: No Electronic Signature(s) Signed: 07/11/2022 4:25:27 PM By: Carlene Coria RN Entered By: Carlene Coria on 07/11/2022 15:58:06 -------------------------------------------------------------------------------- Clinic Level of Care Assessment Details Patient Name: Date of Service: ABRIONNA, SWEM [place] 07/11/2022 3:45 PM Medical Record Number: [id number] Patient Account Number: [id number] Date of Birth/Sex: Treating RN: 04-Sep-1985 (37 y.o. Orvan Falconer Primary Care Tylee Newby: [name] Other Clinician: Referring Torsten Weniger: Treating Deckard Stuber/Extender: [name] in Treatment:  12 Clinic Level of Care Assessment Items TOOL 4 Quantity Score X- 1 0 Use when only an EandM is performed on FOLLOW-UP visit ASSESSMENTS - Nursing Assessment / Reassessment X- 1 10 Reassessment of Co-morbidities (includes updates in patient status) X- 1 5 Reassessment of Adherence to Treatment Plan ASSESSMENTS - Wound and Skin A ssessment / Reassessment X - Simple Wound Assessment / Reassessment - one wound 1 5 []  - 0 Complex Wound Assessment / Reassessment - multiple wounds []  - 0 Dermatologic / Skin Assessment (not related to wound area) ASSESSMENTS - Focused Assessment []  - 0 Circumferential Edema Measurements - multi extremities []  - 0 Nutritional Assessment / Counseling / Intervention []  - 0 Lower Extremity Assessment (monofilament, tuning fork, pulses) []  - 0 Peripheral Arterial Disease Assessment (using hand held doppler) ASSESSMENTS - Ostomy and/or Continence Assessment and Care []  - 0 Incontinence Assessment and Management []  - 0 Ostomy Care Assessment and Management (repouching, etc.) PROCESS - Coordination of Care AMORE, HOFLAND ([id number]) [id number] Page 2 of 7 X- 1 15 Simple Patient / Family Education for ongoing care []  - 0 Complex (extensive) Patient / Family Education for ongoing care []  - 0 Staff obtains Programmer, systems, Records, T Results / Process Orders est []  - 0 Staff telephones HHA, Nursing Homes / Clarify orders / etc []  - 0 Routine Transfer to another Facility (non-emergent condition) []  - 0 Routine Hospital Admission (non-emergent condition) []  - 0 New Admissions / Biomedical engineer / Ordering NPWT Apligraf, etc. , []  - 0 Emergency Hospital Admission (emergent condition) X- 1 10 Simple Discharge Coordination []  - 0 Complex (extensive) Discharge Coordination PROCESS - Special Needs []  - 0 Pediatric / Minor Patient Management []  - 0 Isolation Patient Management []  - 0 Hearing / Language / Visual special  needs []  - 0 Assessment of Community assistance (transportation, D/C planning, etc.) []  - 0 Additional assistance / Altered mentation []  - 0 Support Surface(s) Assessment (bed, cushion, seat, etc.) INTERVENTIONS - Wound Cleansing / Measurement  X - Simple Wound Cleansing - one wound 1 5 []  - 0 Complex Wound Cleansing - multiple wounds X- 1 5 Wound Imaging (photographs - any number of wounds) []  - 0 Wound Tracing (instead of photographs) X- 1 5 Simple Wound Measurement - one wound []  - 0 Complex Wound Measurement - multiple wounds INTERVENTIONS - Wound Dressings X - Small Wound Dressing one or multiple wounds 1 10 []  - 0 Medium Wound Dressing one or multiple wounds []  - 0 Large Wound Dressing one or multiple wounds X- 1 5 Application of Medications - topical []  - 0 Application of Medications - injection INTERVENTIONS - Miscellaneous []  - 0 External ear exam []  - 0 Specimen Collection (cultures, biopsies, blood, body fluids, etc.) []  - 0 Specimen(s) / Culture(s) sent or taken to Lab for analysis []  - 0 Patient Transfer (multiple staff / Civil Service fast streamer / Similar devices) []  - 0 Simple Staple / Suture removal (25 or less) []  - 0 Complex Staple / Suture removal (26 or more) []  - 0 Hypo / Hyperglycemic Management (close monitor of Blood Glucose) []  - 0 Ankle / Brachial Index (ABI) - do not check if billed separately X- 1 5 Vital Signs Has the patient been seen at the hospital within the last three years: Yes Total Score: 80 Level Of Care: New/Established - Level 3 Electronic Signature(s) Signed: 07/11/2022 4:25:27 PM By: Carlene Coria RN Madalyn Rob, Herminio Heads ([id number]) [id number] Page 3 of 7 Entered By: Carlene Coria on 07/11/2022 16:16:26 -------------------------------------------------------------------------------- Encounter Discharge Information Details Patient Name: Date of Service: DOVA, BACOTE [place] 07/11/2022 3:45 PM Medical Record Number:  [id number] Patient Account Number: [id number] Date of Birth/Sex: Treating RN: 09/22/1985 (37 y.o. Orvan Falconer Primary Care Cythia Bachtel: [name] Other Clinician: Referring Harlie Ragle: Treating Kashari Chalmers/Extender: [name] in Treatment: 12 Encounter Discharge Information Items Discharge Condition: Stable Ambulatory Status: Ambulatory Discharge Destination: Home Transportation: Private Auto Accompanied By: self Schedule Follow-up Appointment: Yes Clinical Summary of Care: Electronic Signature(s) Signed: 07/11/2022 4:17:26 PM By: Carlene Coria RN Entered By: Carlene Coria on 07/11/2022 16:17:25 -------------------------------------------------------------------------------- Lower Extremity Assessment Details Patient Name: Date of Service: KYNNA, EALY [place] J. 07/11/2022 3:45 PM Medical Record Number: [id number] Patient Account Number: [id number] Date of Birth/Sex: Treating RN: Feb 25, 1986 (37 y.o. Orvan Falconer Primary Care Jaykub Mackins: [name] Other Clinician: Referring Ashlie Mcmenamy: Treating Trequan Marsolek/Extender: [name] in Treatment: 12 Edema Assessment Assessed: Shirlyn Goltz: No] Patrice Paradise: No] Edema: [Left: Ye] [Right: s] Calf Left: Right: Point of Measurement: 32 cm From Medial Instep 57 cm Ankle Left: Right: Point of Measurement: 12 cm From Medial Instep 32 cm Electronic Signature(s) Signed: 07/11/2022 4:25:27 PM By: Carlene Coria RN Entered By: Carlene Coria on 07/11/2022 16:01:51 -------------------------------------------------------------------------------- Multi Wound Chart Details Patient Name: Date of Service: [name] 07/11/2022 3:45 PM Medical Record Number: [id number] Patient Account Number: [id number] Date of Birth/Sex: Treating RN: 1985/12/31 (37 y.o. Orvan Falconer Primary Care Geroge Gilliam: [name] Other Clinician: Referring Allante Beane: Treating Janila Arrazola/Extender: [name] in Treatment: 12 Vital Signs Height(in): 66 Pulse(bpm): 108 Weight(lbs): 400 Blood Pressure(mmHg): 126/85 Body Mass Index(BMI): 64.6 VENNETTA, BARNFIELD J ([id number]) [id number] Page 4 of 7 Temperature(F): 97.9 Respiratory Rate(breaths/min): 18 [5:Photos:] [N/A:N/A] Left, Medial Lower Leg N/A N/A Wound Location: Gradually Appeared N/A N/A Wounding Event: Venous Leg Ulcer N/A N/A Primary Etiology: Hypertension, Peripheral Venous N/A N/A Comorbid History: Disease 04/10/2022 N/A N/A Date Acquired: 12 N/A N/A Weeks of Treatment: Open N/A N/A Wound Status: No  N/A N/A Wound Recurrence: 1.2x3x0.2 N/A N/A Measurements L x W x D (cm) 2.827 N/A N/A A (cm) : rea 0.565 N/A N/A Volume (cm) : 48.60% N/A N/A % Reduction in Area: 48.60% N/A N/A % Reduction in Volume: Full Thickness Without Exposed N/A N/A Classification: Support Structures Medium N/A N/A Exudate Amount: Serosanguineous N/A N/A Exudate Type: red, brown N/A N/A Exudate Color: Medium (34-66%) N/A N/A Granulation Amount: Red N/A N/A Granulation Quality: Medium (34-66%) N/A N/A Necrotic Amount: Fat Layer (Subcutaneous Tissue): Yes N/A N/A Exposed Structures: Fascia: No Tendon: No Muscle: No Joint: No Bone: No Small (1-33%) N/A N/A Epithelialization: Treatment Notes Electronic Signature(s) Signed: 07/11/2022 4:13:40 PM By: Kalman Shan DO Entered By: Kalman Shan on 07/11/2022 16:13:27 -------------------------------------------------------------------------------- Multi-Disciplinary Care Plan Details Patient Name: Date of Service: [name] 07/11/2022 3:45 PM Medical Record Number: [id number] Patient Account Number: [id number] Date of Birth/Sex: Treating RN: 29-Apr-1985 (37 y.o. Orvan Falconer Primary Care Kawthar Ennen: [name] Other Clinician: Referring Charlayne Vultaggio: Treating Chalmer Zheng/Extender: [name] in  Treatment: 12 Active Inactive Wound/Skin Impairment Nursing Diagnoses: Knowledge deficit related to ulceration/compromised skin integrity Goals: Patient/caregiver will verbalize understanding of skin care regimen Date Initiated: 04/18/2022 Target Resolution Date: 07/18/2022 Goal Status: Active Ulcer/skin breakdown will have a volume reduction of 30% by week 4 Date Initiated: 04/18/2022 Date Inactivated: 07/04/2022 Target Resolution Date: 05/19/2022 EDELIN, RESTREPO ([id number]) [id number] Page 5 of 7 Goal Status: Unmet Unmet Reason: comorbidities Ulcer/skin breakdown will have a volume reduction of 50% by week 8 Date Initiated: 04/18/2022 Date Inactivated: 07/04/2022 Target Resolution Date: 06/18/2022 Goal Status: Unmet Unmet Reason: comorbidities Ulcer/skin breakdown will have a volume reduction of 80% by week 12 Date Initiated: 04/18/2022 Target Resolution Date: 07/18/2022 Goal Status: Active Ulcer/skin breakdown will heal within 14 weeks Date Initiated: 04/18/2022 Target Resolution Date: 08/17/2022 Goal Status: Active Interventions: Assess patient/caregiver ability to obtain necessary supplies Assess patient/caregiver ability to perform ulcer/skin care regimen upon admission and as needed Assess ulceration(s) every visit Notes: Electronic Signature(s) Signed: 07/11/2022 4:25:27 PM By: Carlene Coria RN Entered By: Carlene Coria on 07/11/2022 16:02:19 -------------------------------------------------------------------------------- Pain Assessment Details Patient Name: Date of Service: LILAH, JURKIEWICZ [place] J. 07/11/2022 3:45 PM Medical Record Number: [id number] Patient Account Number: [id number] Date of Birth/Sex: Treating RN: 1985/04/22 (37 y.o. Orvan Falconer Primary Care Tyriana Helmkamp: [name] Other Clinician: Referring Maizy Davanzo: Treating Fartun Paradiso/Extender: [name] in Treatment: 12 Active Problems Location of Pain Severity and  Description of Pain Patient Has Paino No Site Locations Pain Management and Medication Current Pain Management: Electronic Signature(s) Signed: 07/11/2022 4:25:27 PM By: Carlene Coria RN Entered By: Carlene Coria on 07/11/2022 15:58:38 -------------------------------------------------------------------------------- Patient/Caregiver Education Details Patient Name: Date of Service: [name] 3/27/2024andnbsp3:45 PM Medical Record Number: [id number] Patient Account Number: [id number] Date of Birth/Gender: Treating RN: 12-24-1985 (37 y.o. [place], [place], [place] J ([id number]) [phone].pdf Page 6 of 7 Primary Care Physician: [name] Other Clinician: Referring Physician: Treating Physician/Extender: [name] in Treatment: 12 Education Assessment Education Provided To: Patient Education Topics Provided Wound Debridement: Handouts: Wound Debridement Methods: Explain/Verbal Responses: State content correctly Electronic Signature(s) Signed: 07/11/2022 4:25:27 PM By: Carlene Coria RN Entered By: Carlene Coria on 07/11/2022 16:02:37 -------------------------------------------------------------------------------- Wound Assessment Details Patient Name: Date of Service: [name] 07/11/2022 3:45 PM Medical Record Number: [id number] Patient Account Number: [id number] Date of Birth/Sex: Treating RN: 06-13-1985 (37 y.o. Orvan Falconer Primary Care Tregan Read: [name] Other Clinician: Referring Lakara Weiland: Treating  Jeraldin Fesler/Extender: [name] in Treatment: 12 Wound Status Wound Number: 5 Primary Etiology: Venous Leg Ulcer Wound Location: Left, Medial Lower Leg Wound Status: Open Wounding Event: Gradually Appeared Comorbid History: Hypertension, Peripheral Venous Disease Date Acquired: 04/10/2022 Weeks Of Treatment: 12 Clustered Wound: No Photos Wound  Measurements Length: (cm) 1.2 Width: (cm) 3 Depth: (cm) 0.2 Area: (cm) 2.827 Volume: (cm) 0.565 % Reduction in Area: 48.6% % Reduction in Volume: 48.6% Epithelialization: Small (1-33%) Tunneling: No Undermining: No Wound Description Classification: Full Thickness Without Exposed Suppor Exudate Amount: Medium Exudate Type: Serosanguineous Exudate Color: red, brown t Structures Foul Odor After Cleansing: No Slough/Fibrino Yes Wound Bed Granulation Amount: Medium (34-66%) Exposed Structure Granulation Quality: Red Fascia Exposed: No Necrotic Amount: Medium (34-66%) Fat Layer (Subcutaneous Tissue) Exposed: Yes JULICIA, SEO ([id number]) [id number] Page 7 of 7 Necrotic Quality: Adherent Slough Tendon Exposed: No Muscle Exposed: No Joint Exposed: No Bone Exposed: No Treatment Notes Wound #5 (Lower Leg) Wound Laterality: Left, Medial Cleanser Soap and Water Discharge Instruction: Gently cleanse wound with antibacterial soap, rinse and pat dry prior to dressing wounds Vashe 5.8 (oz) Discharge Instruction: Use vashe 5.8 (oz) as directed [place] Discharge Instruction: Apply AandD Ointment as directed around edges to help dressing from sticking Topical keystone Primary Dressing Hydrofera Blue Ready Transfer Foam, 2.5x2.5 (in/in) Discharge Instruction: Apply Hydrofera Blue Ready to wound bed as directed cut to fit wound bed Secondary Dressing (BORDER) Berger Dressing 4x4 (in/in) Discharge Instruction: Please do not put silicone bordered dressings under wraps. Use non-bordered dressing only. Secured With Compression Wrap Compression Stockings Environmental education officer) Signed: 07/11/2022 4:25:27 PM By: Carlene Coria RN Entered By: Carlene Coria on 07/11/2022 16:01:07 -------------------------------------------------------------------------------- Vitals Details Patient Name: Date of  Service: [place], [place] J. 07/11/2022 3:45 PM Medical Record Number: [id number] Patient Account Number: [id number] Date of Birth/Sex: Treating RN: [birth year] (37 y.o. Orvan Falconer Primary Care Danita Proud: [name] Other Clinician: Referring Annabel Gibeau: Treating Althia Egolf/Extender: [name] in Treatment: 12 Vital Signs Time Taken: 15:58 Temperature (F): 97.9 Height (in): 66 Pulse (bpm): 108 Weight (lbs): 400 Respiratory Rate (breaths/min): 18 Body Mass Index (BMI): 64.6 Blood Pressure (mmHg): 126/85 Reference Range: 80 - 120 mg / dl Electronic Signature(s) Signed: 07/11/2022 4:25:27 PM By: Carlene Coria RN Entered By: Carlene Coria on 07/11/2022 15:58:31

## 2022-07-11 NOTE — Progress Notes (Signed)
IO, DONNEL ([id number]) [id number] Page 1 of 9 Visit Report for 07/11/2022 Chief Complaint Document Details Patient Name: Date of Service: [name], [name] 07/11/2022 3:45 PM Medical Record Number: [id number] Patient Account Number: [id number] Date of Birth/Sex: Treating RN: [birth year] (37 y.o. Orvan Falconer Primary Care Provider: [name] Other Clinician: Referring Provider: Treating Provider/Extender: [name] in Treatment: 12 Information Obtained from: Patient Chief Complaint 04/18/2022; Left LE Ulcer Electronic Signature(s) Signed: 07/11/2022 4:13:40 PM By: Kalman Shan DO Entered By: Kalman Shan on 07/11/2022 16:10:01 -------------------------------------------------------------------------------- HPI Details Patient Name: Date of Service: [place], [name] 07/11/2022 3:45 PM Medical Record Number: [id number] Patient Account Number: [id number] Date of Birth/Sex: Treating RN: 06-28-1985 (37 y.o. Orvan Falconer Primary Care Provider: [name] Other Clinician: Referring Provider: Treating Provider/Extender: [name] in Treatment: 12 History of Present Illness HPI Description: 37 year old patient who started with having ulcerations on the right lower leg on the lateral part of her ankle for about 2 weeks. She was seen in the ER at [facility] and advised to see the wound care for a consultation. No X-rays of workup was done during the ER visit and no prescription for any medications of compression wraps were given. the patient is not diabetic but does have hypertension and her medications have been reviewed by me. 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 The right and left greater saphenous vein  was found to be tortuous. Deep venous system was also not competent and there was reflux of more than 500 ms. She was then seen by Dr. Darene Lamer Early who recommended that the patient would not benefit from endovenous ablation and he had recommended vein stripping odd on the right side and multiple small phlebectomy procedures on the left side. the patient did not follow-up due to social economic reasons. She has not been wearing any compression stockings and has not taken any specific treatment for varicose veins for the last 3 years. 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 We have obtained some appointments to see the vascular surgeons by the end of the month and the patient would like to follow up with me at my [facility] on Wednesday, June 29. 10/14/2014 -- she could not see me yesterday in [place] and hence has come for a review today. She has a vascular workup to be done this afternoon at [facility]. She is doing fine otherwise. 10/22/2014 -- she was seen by Dr. Althea Charon and he has recommended surgical removal of her right saphenous vein from distal thigh to saphenofemoral odd junction and stab phlebectomy's of multiple large tributary branches throughout her thigh and calf. This would be done under general anesthesia in the outpatient setting. 10/29/2014 -- she is trying to work on a surgical date and in the meanwhile we have got insurance clearance for Apligraf and we will start this next week. 7/22 2016 -- she is here for the first application of Apligraf. 11/19/2014 -- she is here for a second application of Apligraf 11/26/2014 -- she has done fine after her last application of Apligraf and is awaiting her surgery which is scheduled for August 31. 12/03/2014 -- she is doing fine and is here  for her third application of Apligraf. 12/21/2014 -- She had surgery on 12/15/2014 by  Dr.Early who did #1 ligation and stripping of right great saphenous vein from distal thigh to saphenofemoral junction, #2 stab phlebectomy of large tributary varicose veins in the thigh popliteal space and calf. She had an Ace wrap up to her groin and this was removed today and the Unna's boot was also removed. 12/28/2014 -- she is here for her fourth application of Apligraf. 01/06/2015 - he saw her vascular surgeon Dr. Donnetta Hutching who was pleased with her progress and he has confirmed that no surgical procedures could be attempted on the left side. 01/13/2015 -- her wound looks very good and she's been having no problems whatsoever. Readmission: 07/26/2020 upon evaluation today patient presents for initial inspection here in our clinic for a new issue with her left leg although she is previously been seen due to issues with the right leg back in 2016. At that time she was seeing Dr. Donnetta Hutching who is a vein/vascular specialist in [place]. He has since semiretired from what I understand. He is working out of [employer] I believe. Nonetheless she tells me at the time that there was really nothing to do for her left leg [name], [name] ([id number]) [id number] Page 2 of 9 although the right leg was where they did most of the work. Subsequently she states that she is done fairly well until just in the past week where she had issues with bleeding from what appears to be varicose vein on the left leg medially. Unfortunately this has continued to be an issue although she tells me at first it was coming much more significantly Down quite a bit but nonetheless has not completely resolved. Every time she showers she notices that it starts to drain a little bit more. She does have a history of chronic venous insufficiency, lymphedema, varicose veins bilaterally, and obesity. 08/02/2020 upon evaluation today patient appears to be doing about the same in regards to the ulcer on her left leg.  She has some eschar covering there is definitely some fluid collecting underneath unfortunately. With that being said she tells me she is still having a tremendous amount of pain therefore she is really not able to allow me to clean this off very effectively to be perfectly honest. I think we need to try to soften this up 08/16/2020 upon evaluation today patient's wound is really not doing significantly better not really states about the same. She notes that the wrap just does not seem to be staying up very well at all unfortunately. No fevers, chills, nausea, vomiting, or diarrhea. She did cut it off once it starts to slide in order to alleviate some of the pressure from sliding Down. Fortunately there is no signs of active infection at this time which is great news. 08/23/2020 upon evaluation today patient appears to be doing well 08/23/2020 upon evaluation today patient appears to be doing well with regard to her wound all things considered. Fortunately there does not appear to be any signs of active infection at this time which is great news. She has been tolerating the dressing changes without complication and overall I am extremely pleased with where things stand at this point. She does have her appointment with vascular in [facility] on June 9. 08/30/2020 upon evaluation today patient actually appears to be doing decently well in regard to her wound. Fortunately there is no signs of active infection which is great news. Nonetheless I do believe that the  patient is going require little bit of debridement if she is okay with me attempting that today I think that will help clean off some of the necrotic tissue. Fortunately there does not appear to be otherwise any evidence of active infection which is also great news. 09/19/2020 upon evaluation today patient appears to be doing a little better in regard to her wound as compared to previous. Fortunately there does not appear to be any signs of active  infection overall. No fever chills noted. I do believe that the Iodosorb has made this a little bit better with regard to the overall size and appearance of the wound bed though again she does still have quite a ways to go to get this to heal she still very tender to touch. 09/27/2020 upon evaluation today patient appears to actually be doing quite well with regard to her wound. This is measuring much smaller which is great news. With that being said she did see vein and vascular in [facility] and they subsequently recommended that surgery is really what she probably needs to go forward with sounds like the potential for venous ablation. With that being said the patient tells me this is just not the right time for her to be able to proceed with any type of surgery which I completely understand. Nonetheless I do believe that she would continue to benefit from compression but again that is really not something that she is able to easily do. 10/04/2020 upon evaluation today patient appears to be doing about the same in regard to her wound. This is measuring a little bit smaller but nonetheless still is open and again has some slough and biofilm noted on the surface of the wound. There does not appear to be any signs of active infection which is great news. No fevers, chills, nausea, vomiting, or diarrhea. 10/04/2020 upon evaluation today patient appears to be doing well with regard to her wound. She has been tolerating dressing changes without complication. Fortunately there does not appear to be any signs of active infection which is great news. No fevers, chills, nausea, vomiting, or diarrhea. 10/25/2020 upon evaluation today patient appears to be doing well with regard to her wound. She has been tolerating the dressing changes without complication. Fortunately there is no signs of active infection at this time. No fevers, chills, nausea, vomiting, or diarrhea. 11/01/2020 upon evaluation today patient with  regard to her wound. She has been tolerating the dressing changes without complication. Fortunately there does not appear to be any signs of infection which is great news. No fever chills noted 11/15/2020 upon evaluation today patient appears to be doing well with regard to her wound. Fortunately there is no signs of active infection at this time. No fevers, chills, nausea, vomiting, or diarrhea. With that being said she continues to have a significant amount of pain at the site even though this is very close to complete closure. She also had several varicose veins around the area which were also problematic. Overall however I feel like the patient is making excellent progress. 11/28/2020 upon evaluation today patient appears to be doing well with regard to her leg ulcer. Again were not really able to debride or compression wrap her due to discomfort and pain. She does not allow for that. With that being said we have been using Iodosorb which does seem to be doing decently well. Fortunately there is no signs of active infection at this time which is great news. No fevers, chills, nausea, vomiting,  or diarrhea. 8/31; patient presents for 2-week follow-up. She has been using Iodosorb. She reports that the wound is closed. She denies signs of infection. Readmission: 10-27-2021 upon evaluation this is a patient that presents today whom I have previously seen this is pretty much for the same issue though I think a little bit higher than the last time I saw her. She does have a history of chronic venous insufficiency and hypertension along with varicose veins. Subsequently she does have an ulceration which spontaneously ruptured she has not been wearing any compression which I think is a big part of the issue here. We discussed this before I really think she probably needs to be wearing her compression therapy, she probably needs lymphedema pumps if she can ever wear the compression for a significant amount of  time to get these, and subsequently also think that she needs to be elevating her legs is much as possible she may even need some vascular intervention in regard to her veins. All of this was reiterated and discussed with her today to reinforce what needs to happen in order to ensure that her legs do not get a lot worse. The patient voiced understanding. She tells me that she knows because she is seeing her mom go through a lot of this as well how bad things can get. 11-03-2021 upon evaluation today patient appears to be doing well with regard to her wound. Fortunately there does not appear to be any signs of active infection at this time. She is measuring a little bit bigger but I think this is because the wound is actually cleaning up a bit here. 7/27; left lateral leg wound not any smaller but perhaps with a cleaner surface. She is using Iodoflex to help with the latter and using Tubigrip. She has chronic venous insufficiency with secondary lymphedema. She is apparently followed by vein and vascular and is being scheduled for an ablation 11-17-2021 upon evaluation today patient appears to be doing well with regard to her wound this is actually showing signs of improvement which is great news. Fortunately I do not see any evidence of active infection locally or systemically at this time which is great news. No fevers, chills, nausea, vomiting, or diarrhea. 11-24-2021 upon evaluation today patient's wound is actually showing signs of significant improvement. Unfortunately she had quite a bit of pain with debridement last week I do believe it was beneficial but at the same time she is doing much better but still really does not want this debrided again I think being that it is looking a whole lot better I would try to avoid that today especially since it caused her so much discomfort that is really not the goal and I explained that to the patient today she voiced understanding and knows that it needed to  be done but still states that it was quite painful. 11-30-2021 upon evaluation today patient appears to be doing excellent in regard to her wound this is actually showing signs of excellent improvement I am very pleased with where things stand. She does have her venous ablation appointment for October 17. 12-21-2021 upon evaluation today patient appears to be doing better in regard to her wound this is measuring smaller and looking better as well. Fortunately I do not see any signs of active infection locally or systemically which is great news. 01-01-2022 upon evaluation today patient appears to be doing well currently in regard to her wound. She is showing signs of improvement which is great  news and overall I do not see any signs of active infection locally or systemically at this time. 01-08-2022 upon evaluation today patient appears to be doing well currently in regard to her wound she is actually showing signs of significant improvement CHARELLE, FORREN ([id number]) [id number] Page 3 of 9 which is great news. Fortunately I do not see any evidence of active infection locally or systemically at this time. I do believe that we are on the right track. She also has her appointment October 19 for the venous ablation. 01-16-2022 upon evaluation today patient's wound actually is showing some signs of improvement although this is very slow. Fortunately I do not see any evidence of infection at this time. The volume is a little bit more although the size is smaller I think this is due to the fact that we are slowly cleaning this area out effectively. 11/6 continued improvement the patient is using Iodoflex and a Tubigrip E. She was supposed to have venous surgery at vein and vascular in [place] however somehow this is gotten delayed till December 14. 02-27-2022 upon evaluation today patient's wound is actually showing signs of being completely healed. Fortunately I do not see any  evidence of active infection locally or systemically which is great news and overall I am extremely pleased with where we stand today. Unfortunately she does tell me that she postpone her venous ablation surgery until December 14 she tells me that she was not ready "financially" for this. 04/18/2022; Ms. Gara Wahls is a 37 year old female with a past medical history of venous insufficiency that presents the clinic for a left lower extremity wound. She was seen almost 2 months ago for the same wound. This had healed with Iodoflex and Tubigrip. She states that the wound recently reopened. She has seen vein and vascular and plan is for ligation and stripping of the left great saphenous vein. She is not sure when this procedure is going to be scheduled. She has canceled it once before. She has had office compression wraps in the past however these do not stay on and create more of an issue for her. She would like to avoid this. She currently denies signs of infection. 1/10; patient presents for follow-up. She has been using Iodoflex to the wound bed. She states she has been using her Tubigrip however she does not have this on today. She has no issues or complaints today. She denies signs of infection. 1/17; patient presents for follow-up. She has not been using Iodoflex to the wound bed. She reports acute pain. She denies increased warmth, erythema or purulent drainage to the left lower extremity. She states she has been using Tubigrip. She has information to order the compression stockings but has not obtained them. 1/24; Patient had a wound culture done at last clinic visit that grew Proteus mirabilis and E. coli. She was prescribed levofloxacin and this should cover both bacteria. She has been taking the medication over the past week with improvement of symptoms to the wound bed. She has been using Hydrofera Blue under Tubigrip. She states the [facility] is sticking to her wound bed. 1/31;  patient presents for follow-up. She has been using Medihoney to the wound bed with improvement in healing. She has been contacted by [facility] to order her antibiotic ointment. She is not sure yet if she is able to afford this. She received her compression stockings but states they did not fit well. She is working on returning these. She has been using  Tubigrip. 2/7; patient presents for follow-up. She is been using Medihoney to the wound bed. She obtained her compression stockings and has been using these daily. She states that the [facility] antibiotic ointment is arriving in the mail tomorrow. She has no issues or complaints today. She denies systemic signs of infection. 2/14; patient presents for follow-up. She has been using Keystone antibiotic ointment to the wound bed. She states the [facility] is sticking and she has stopped this. She is been using her compression stockings daily. She does not have these on today. 2/28; patient presents for follow-up. She continues to use [facility] antibiotic ointment to the wound bed. There is been improvement in healing. She has been using her compression stockings daily. 3/12; patient presents for follow-up. She has been using Keystone antibiotic ointment to the wound bed. She is not wearing her compression stockings today but states she does wear them daily. She declines debridement today. 3/20; patient presents for follow-up. She has been using Keystone antibiotic ointment with Hydrofera Blue. She states she is wearing her compression stockings daily but she does not have them on today. 3/27; patient presents for follow-up. She has been using Keystone antibiotic ointment and hydrofera blue to the wound bed. She reports wearing compression stockings. Electronic Signature(s) Signed: 07/11/2022 4:13:40 PM By: Kalman Shan DO Entered By: Kalman Shan on 07/11/2022  16:12:14 -------------------------------------------------------------------------------- Physical Exam Details Patient Name: Date of Service: [place], [place] 07/11/2022 3:45 PM Medical Record Number: [id number] Patient Account Number: [id number] Date of Birth/Sex: Treating RN: [birth year] (37 y.o. Orvan Falconer Primary Care Provider: [name] Other Clinician: Referring Provider: Treating Provider/Extender: [name] in Treatment: 12 Constitutional . Cardiovascular . Psychiatric . Notes Left lower extremity: T the distal medial aspect there is an open wound with granulation tissue. No increased warmth, erythema or purulent drainage. Stage III o lymphedema PRESSLY, CORSENTINO ([id number]) [id number] Page 4 of 9 Electronic Signature(s) Signed: 07/11/2022 4:13:40 PM By: Kalman Shan DO Entered By: Kalman Shan on 07/11/2022 16:10:53 -------------------------------------------------------------------------------- Physician Orders Details Patient Name: Date of Service: [place], [place] 07/11/2022 3:45 PM Medical Record Number: [id number] Patient Account Number: [id number] Date of Birth/Sex: Treating RN: [birth year] (37 y.o. Orvan Falconer Primary Care Provider: [name] Other Clinician: Referring Provider: Treating Provider/Extender: [name] in Treatment: 12 Verbal / Phone Orders: No Diagnosis Coding Follow-up Appointments Wound #5 Left,Medial Lower Leg Return Appointment in 2 weeks. Bathing/ [employer] wounds with antibacterial soap and water. May shower with wound dressing protected with water repellent cover or cast protector. No tub bath. Edema Control - Lymphedema / Segmental Compressive Device / Other Wound #5 Left,Medial Lower Leg Patient to wear own compression stockings. Remove compression stockings every night before going to bed and put on  every morning when getting up. - LLE, Elevate, Exercise Daily and A void Standing for Long Periods of Time. Elevate legs to the level of the heart and pump ankles as often as possible Elevate leg(s) parallel to the floor when sitting. Medications-Please add to medication list. Wound #5 Left,Medial Lower Leg Keystone Compound Wound Treatment Wound #5 - Lower Leg Wound Laterality: Left, Medial Cleanser: Soap and Water 1 x Per Day/30 Days Discharge Instructions: Gently cleanse wound with antibacterial soap, rinse and pat dry prior to dressing wounds Cleanser: Vashe 5.8 (oz) 1 x Per Day/30 Days Discharge Instructions: Use vashe 5.8 (oz) as directed Peri-Wound Care: AandD Ointment 1 x Per Day/30 Days Discharge Instructions: Apply  AandD Ointment as directed around edges to help dressing from sticking Topical: keystone 1 x Per Day/30 Days Prim Dressing: Hydrofera Blue Ready Transfer Foam, 2.5x2.5 (in/in) 1 x Per Day/30 Days ary Discharge Instructions: Apply Hydrofera Blue Ready to wound bed as directed cut to fit wound bed Secondary Dressing: (BORDER) Zetuvit Plus SILICONE BORDER Dressing 4x4 (in/in) 1 x Per Day/30 Days Discharge Instructions: Please do not put silicone bordered dressings under wraps. Use non-bordered dressing only. Electronic Signature(s) Signed: 07/11/2022 4:13:40 PM By: Kalman Shan DO Entered By: Kalman Shan on 07/11/2022 16:12:44 -------------------------------------------------------------------------------- Problem List Details Patient Name: Date of Service: [place], [place] 07/11/2022 3:45 PM Medical Record Number: [id number] Patient Account Number: [id number] Date of Birth/Sex: Treating RN: 10-09-1985 (37 y.o. Orvan Falconer Primary Care Provider: [name] Other Clinician: [name] ([id number]) [id number] Page 5 of 9 Referring Provider: Treating Provider/Extender: [name] in  Treatment: 12 Active Problems ICD-10 Encounter Code Description Active Date MDM Diagnosis I87.312 Chronic venous hypertension (idiopathic) with ulcer of left lower extremity 04/18/2022 No Yes L97.822 Non-pressure chronic ulcer of other part of left lower leg with fat layer exposed1/06/2022 No Yes I89.0 Lymphedema, not elsewhere classified 04/18/2022 No Yes Inactive Problems Resolved Problems Electronic Signature(s) Signed: 07/11/2022 4:13:40 PM By: Kalman Shan DO Entered By: Kalman Shan on 07/11/2022 16:09:57 -------------------------------------------------------------------------------- Progress Note Details Patient Name: Date of Service: [place], [place] J. 07/11/2022 3:45 PM Medical Record Number: [id number] Patient Account Number: [id number] Date of Birth/Sex: Treating RN: January 29, 1986 (37 y.o. Orvan Falconer Primary Care Provider: [name] Other Clinician: Referring Provider: Treating Provider/Extender: [name] in Treatment: 12 Subjective Chief Complaint Information obtained from Patient 04/18/2022; Left LE Ulcer History of Present Illness (HPI) 37 year old patient who started with having ulcerations on the right lower leg on the lateral part of her ankle for about 2 weeks. She was seen in the ER at [facility] and advised to see the wound care for a consultation. No X-rays of workup was done during the ER visit and no prescription for any medications of compression wraps were given. the patient is not diabetic but does have hypertension and her medications have been reviewed by me. 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 The right and left greater saphenous vein was found to be tortuous. Deep venous system was also not competent and there was reflux of more than 500  ms. She was then seen by Dr. Darene Lamer Early who recommended that the patient would not benefit from endovenous ablation and he had recommended vein stripping odd on the right side and multiple small phlebectomy procedures on the left side. the patient did not follow-up due to social economic reasons. She has not been wearing any compression stockings and has not taken any specific treatment for varicose veins for the last 3 years. 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 We have obtained some appointments to see the vascular surgeons by the end of the month and the patient would like to follow up with me at my [facility] on Wednesday, June 29. 10/14/2014 -- she could not see me yesterday in [place] and hence has come for a review today. She has a vascular workup  to be done this afternoon at [facility]. She is doing fine otherwise. 10/22/2014 -- she was seen by Dr. Althea Charon and he has recommended surgical removal of her right saphenous vein from distal thigh to saphenofemoral odd junction and stab phlebectomy's of multiple large tributary branches throughout her thigh and calf. This would be done under general anesthesia in the outpatient setting. 10/29/2014 -- she is trying to work on a surgical date and in the meanwhile we have got insurance clearance for Apligraf and we will start this next week. 7/22 2016 -- she is here for the first application of Apligraf. 11/19/2014 -- she is here for a second application of Apligraf 11/26/2014 -- she has done fine after her last application of Apligraf and is awaiting her surgery which is scheduled for August 31. 12/03/2014 -- she is doing fine and is here for her third application of Apligraf. 12/21/2014 -- She had surgery on 12/15/2014 by Dr.Early who did #1 ligation and stripping of right great saphenous vein from distal thigh to  saphenofemoral junction, #2 stab phlebectomy of large tributary varicose veins in the thigh popliteal space and calf. TRISTANA, SUTHERS ([id number]) [id number] Page 6 of 9 She had an Ace wrap up to her groin and this was removed today and the Unna's boot was also removed. 12/28/2014 -- she is here for her fourth application of Apligraf. 01/06/2015 - he saw her vascular surgeon Dr. Donnetta Hutching who was pleased with her progress and he has confirmed that no surgical procedures could be attempted on the left side. 01/13/2015 -- her wound looks very good and she's been having no problems whatsoever. Readmission: 07/26/2020 upon evaluation today patient presents for initial inspection here in our clinic for a new issue with her left leg although she is previously been seen due to issues with the right leg back in 2016. At that time she was seeing Dr. Donnetta Hutching who is a vein/vascular specialist in [place]. He has since semiretired from what I understand. He is working out of [employer] I believe. Nonetheless she tells me at the time that there was really nothing to do for her left leg although the right leg was where they did most of the work. Subsequently she states that she is done fairly well until just in the past week where she had issues with bleeding from what appears to be varicose vein on the left leg medially. Unfortunately this has continued to be an issue although she tells me at first it was coming much more significantly Down quite a bit but nonetheless has not completely resolved. Every time she showers she notices that it starts to drain a little bit more. She does have a history of chronic venous insufficiency, lymphedema, varicose veins bilaterally, and obesity. 08/02/2020 upon evaluation today patient appears to be doing about the same in regards to the ulcer on her left leg. She has some eschar covering there is definitely some fluid collecting underneath  unfortunately. With that being said she tells me she is still having a tremendous amount of pain therefore she is really not able to allow me to clean this off very effectively to be perfectly honest. I think we need to try to soften this up 08/16/2020 upon evaluation today patient's wound is really not doing significantly better not really states about the same. She notes that the wrap just does not seem to be staying up very well at all unfortunately. No fevers, chills, nausea, vomiting, or diarrhea. She did cut it off  once it starts to slide in order to alleviate some of the pressure from sliding Down. Fortunately there is no signs of active infection at this time which is great news. 08/23/2020 upon evaluation today patient appears to be doing well 08/23/2020 upon evaluation today patient appears to be doing well with regard to her wound all things considered. Fortunately there does not appear to be any signs of active infection at this time which is great news. She has been tolerating the dressing changes without complication and overall I am extremely pleased with where things stand at this point. She does have her appointment with vascular in [facility] on June 9. 08/30/2020 upon evaluation today patient actually appears to be doing decently well in regard to her wound. Fortunately there is no signs of active infection which is great news. Nonetheless I do believe that the patient is going require little bit of debridement if she is okay with me attempting that today I think that will help clean off some of the necrotic tissue. Fortunately there does not appear to be otherwise any evidence of active infection which is also great news. 09/19/2020 upon evaluation today patient appears to be doing a little better in regard to her wound as compared to previous. Fortunately there does not appear to be any signs of active infection overall. No fever chills noted. I do believe that the Iodosorb has made this a  little bit better with regard to the overall size and appearance of the wound bed though again she does still have quite a ways to go to get this to heal she still very tender to touch. 09/27/2020 upon evaluation today patient appears to actually be doing quite well with regard to her wound. This is measuring much smaller which is great news. With that being said she did see vein and vascular in [facility] and they subsequently recommended that surgery is really what she probably needs to go forward with sounds like the potential for venous ablation. With that being said the patient tells me this is just not the right time for her to be able to proceed with any type of surgery which I completely understand. Nonetheless I do believe that she would continue to benefit from compression but again that is really not something that she is able to easily do. 10/04/2020 upon evaluation today patient appears to be doing about the same in regard to her wound. This is measuring a little bit smaller but nonetheless still is open and again has some slough and biofilm noted on the surface of the wound. There does not appear to be any signs of active infection which is great news. No fevers, chills, nausea, vomiting, or diarrhea. 10/04/2020 upon evaluation today patient appears to be doing well with regard to her wound. She has been tolerating dressing changes without complication. Fortunately there does not appear to be any signs of active infection which is great news. No fevers, chills, nausea, vomiting, or diarrhea. 10/25/2020 upon evaluation today patient appears to be doing well with regard to her wound. She has been tolerating the dressing changes without complication. Fortunately there is no signs of active infection at this time. No fevers, chills, nausea, vomiting, or diarrhea. 11/01/2020 upon evaluation today patient with regard to her wound. She has been tolerating the dressing changes without complication.  Fortunately there does not appear to be any signs of infection which is great news. No fever chills noted 11/15/2020 upon evaluation today patient appears to be doing well  with regard to her wound. Fortunately there is no signs of active infection at this time. No fevers, chills, nausea, vomiting, or diarrhea. With that being said she continues to have a significant amount of pain at the site even though this is very close to complete closure. She also had several varicose veins around the area which were also problematic. Overall however I feel like the patient is making excellent progress. 11/28/2020 upon evaluation today patient appears to be doing well with regard to her leg ulcer. Again were not really able to debride or compression wrap her due to discomfort and pain. She does not allow for that. With that being said we have been using Iodosorb which does seem to be doing decently well. Fortunately there is no signs of active infection at this time which is great news. No fevers, chills, nausea, vomiting, or diarrhea. 8/31; patient presents for 2-week follow-up. She has been using Iodosorb. She reports that the wound is closed. She denies signs of infection. Readmission: 10-27-2021 upon evaluation this is a patient that presents today whom I have previously seen this is pretty much for the same issue though I think a little bit higher than the last time I saw her. She does have a history of chronic venous insufficiency and hypertension along with varicose veins. Subsequently she does have an ulceration which spontaneously ruptured she has not been wearing any compression which I think is a big part of the issue here. We discussed this before I really think she probably needs to be wearing her compression therapy, she probably needs lymphedema pumps if she can ever wear the compression for a significant amount of time to get these, and subsequently also think that she needs to be elevating her legs  is much as possible she may even need some vascular intervention in regard to her veins. All of this was reiterated and discussed with her today to reinforce what needs to happen in order to ensure that her legs do not get a lot worse. The patient voiced understanding. She tells me that she knows because she is seeing her mom go through a lot of this as well how bad things can get. 11-03-2021 upon evaluation today patient appears to be doing well with regard to her wound. Fortunately there does not appear to be any signs of active infection at this time. She is measuring a little bit bigger but I think this is because the wound is actually cleaning up a bit here. 7/27; left lateral leg wound not any smaller but perhaps with a cleaner surface. She is using Iodoflex to help with the latter and using Tubigrip. She has chronic venous insufficiency with secondary lymphedema. She is apparently followed by vein and vascular and is being scheduled for an ablation 11-17-2021 upon evaluation today patient appears to be doing well with regard to her wound this is actually showing signs of improvement which is great news. Fortunately I do not see any evidence of active infection locally or systemically at this time which is great news. No fevers, chills, nausea, vomiting, or diarrhea. 11-24-2021 upon evaluation today patient's wound is actually showing signs of significant improvement. Unfortunately she had quite a bit of pain with debridement last week I do believe it was beneficial but at the same time she is doing much better but still really does not want this debrided again I think being that it is looking a whole lot better I would try to avoid that today especially since  it caused her so much discomfort that is really not the goal and I explained DORA, HATHEWAY ([id number]) [id number] Page 7 of 9 that to the patient today she voiced understanding and knows that it needed to be  done but still states that it was quite painful. 11-30-2021 upon evaluation today patient appears to be doing excellent in regard to her wound this is actually showing signs of excellent improvement I am very pleased with where things stand. She does have her venous ablation appointment for October 17. 12-21-2021 upon evaluation today patient appears to be doing better in regard to her wound this is measuring smaller and looking better as well. Fortunately I do not see any signs of active infection locally or systemically which is great news. 01-01-2022 upon evaluation today patient appears to be doing well currently in regard to her wound. She is showing signs of improvement which is great news and overall I do not see any signs of active infection locally or systemically at this time. 01-08-2022 upon evaluation today patient appears to be doing well currently in regard to her wound she is actually showing signs of significant improvement which is great news. Fortunately I do not see any evidence of active infection locally or systemically at this time. I do believe that we are on the right track. She also has her appointment October 19 for the venous ablation. 01-16-2022 upon evaluation today patient's wound actually is showing some signs of improvement although this is very slow. Fortunately I do not see any evidence of infection at this time. The volume is a little bit more although the size is smaller I think this is due to the fact that we are slowly cleaning this area out effectively. 11/6 continued improvement the patient is using Iodoflex and a Tubigrip E. She was supposed to have venous surgery at vein and vascular in [place] however somehow this is gotten delayed till December 14. 02-27-2022 upon evaluation today patient's wound is actually showing signs of being completely healed. Fortunately I do not see any evidence of active infection locally or systemically which is great news and  overall I am extremely pleased with where we stand today. Unfortunately she does tell me that she postpone her venous ablation surgery until December 14 she tells me that she was not ready "financially" for this. 04/18/2022; Ms. Tiawana Clouse is a 37 year old female with a past medical history of venous insufficiency that presents the clinic for a left lower extremity wound. She was seen almost 2 months ago for the same wound. This had healed with Iodoflex and Tubigrip. She states that the wound recently reopened. She has seen vein and vascular and plan is for ligation and stripping of the left great saphenous vein. She is not sure when this procedure is going to be scheduled. She has canceled it once before. She has had office compression wraps in the past however these do not stay on and create more of an issue for her. She would like to avoid this. She currently denies signs of infection. 1/10; patient presents for follow-up. She has been using Iodoflex to the wound bed. She states she has been using her Tubigrip however she does not have this on today. She has no issues or complaints today. She denies signs of infection. 1/17; patient presents for follow-up. She has not been using Iodoflex to the wound bed. She reports acute pain. She denies increased warmth, erythema or purulent drainage to the left lower extremity. She states  she has been using Tubigrip. She has information to order the compression stockings but has not obtained them. 1/24; Patient had a wound culture done at last clinic visit that grew Proteus mirabilis and E. coli. She was prescribed levofloxacin and this should cover both bacteria. She has been taking the medication over the past week with improvement of symptoms to the wound bed. She has been using Hydrofera Blue under Tubigrip. She states the [facility] is sticking to her wound bed. 1/31; patient presents for follow-up. She has been using Medihoney to the wound bed with  improvement in healing. She has been contacted by [facility] to order her antibiotic ointment. She is not sure yet if she is able to afford this. She received her compression stockings but states they did not fit well. She is working on returning these. She has been using Tubigrip. 2/7; patient presents for follow-up. She is been using Medihoney to the wound bed. She obtained her compression stockings and has been using these daily. She states that the [facility] antibiotic ointment is arriving in the mail tomorrow. She has no issues or complaints today. She denies systemic signs of infection. 2/14; patient presents for follow-up. She has been using Keystone antibiotic ointment to the wound bed. She states the [facility] is sticking and she has stopped this. She is been using her compression stockings daily. She does not have these on today. 2/28; patient presents for follow-up. She continues to use Navos antibiotic ointment to the wound bed. There is been improvement in healing. She has been using her compression stockings daily. 3/12; patient presents for follow-up. She has been using Keystone antibiotic ointment to the wound bed. She is not wearing her compression stockings today but states she does wear them daily. She declines debridement today. 3/20; patient presents for follow-up. She has been using Keystone antibiotic ointment with Hydrofera Blue. She states she is wearing her compression stockings daily but she does not have them on today. 3/27; patient presents for follow-up. She has been using Keystone antibiotic ointment and hydrofera blue to the wound bed. She reports wearing compression stockings. Objective Constitutional Vitals Time Taken: 3:58 PM, Height: 66 in, Weight: 400 lbs, BMI: 64.6, Temperature: 97.9 F, Pulse: 108 bpm, Respiratory Rate: 18 breaths/min, Blood Pressure: 126/85 mmHg. General Notes: Left lower extremity: T the distal medial aspect there is an open wound  with granulation tissue. No increased warmth, erythema or purulent o drainage. Stage III lymphedema Integumentary (Hair, Skin) Wound #5 status is Open. Original cause of wound was Gradually Appeared. The date acquired was: 04/10/2022. The wound has been in treatment 12 weeks. The wound is located on the Left,Medial Lower Leg. The wound measures 1.2cm length x 3cm width x 0.2cm depth; 2.827cm^2 area and 0.565cm^3 volume. There is Fat Layer (Subcutaneous Tissue) exposed. There is no tunneling or undermining noted. There is a medium amount of serosanguineous drainage noted. There is medium (34-66%) red granulation within the wound bed. There is a medium (34-66%) amount of necrotic tissue within the wound bed including Adherent Slough. SHEREKA, KOVANDA ([id number]) [id number] Page 8 of 9 Assessment Active Problems ICD-10 Chronic venous hypertension (idiopathic) with ulcer of left lower extremity Non-pressure chronic ulcer of other part of left lower leg with fat layer exposed Lymphedema, not elsewhere classified Patient's wound has shown improvement in size and appearance since last clinic visit. No need for debridement today. I recommended continuing Keystone antibiotic ointment and Hydrofera Blue. Follow-up in 2 weeks. Continue compression stockings daily  Plan Follow-up Appointments: Wound #5 Left,Medial Lower Leg: Return Appointment in 2 weeks. Bathing/ Shower/ Hygiene: Wash wounds with antibacterial soap and water. May shower with wound dressing protected with water repellent cover or cast protector. No tub bath. Edema Control - Lymphedema / Segmental Compressive Device / Other: Wound #5 Left,Medial Lower Leg: Patient to wear own compression stockings. Remove compression stockings every night before going to bed and put on every morning when getting up. - LLE, Elevate, Exercise Daily and Avoid Standing for Long Periods of Time. Elevate legs to the level of  the heart and pump ankles as often as possible Elevate leg(s) parallel to the floor when sitting. Medications-Please add to medication list.: Wound #5 Left,Medial Lower Leg: Keystone Compound WOUND #5: - Lower Leg Wound Laterality: Left, Medial Cleanser: Soap and Water 1 x Per Day/30 Days Discharge Instructions: Gently cleanse wound with antibacterial soap, rinse and pat dry prior to dressing wounds Cleanser: Vashe 5.8 (oz) 1 x Per Day/30 Days Discharge Instructions: Use vashe 5.8 (oz) as directed Peri-Wound Care: AandD Ointment 1 x Per Day/30 Days Discharge Instructions: Apply AandD Ointment as directed around edges to help dressing from sticking Topical: keystone 1 x Per Day/30 Days Prim Dressing: Hydrofera Blue Ready Transfer Foam, 2.5x2.5 (in/in) 1 x Per Day/30 Days ary Discharge Instructions: Apply Hydrofera Blue Ready to wound bed as directed cut to fit wound bed Secondary Dressing: (BORDER) Zetuvit Plus SILICONE BORDER Dressing 4x4 (in/in) 1 x Per Day/30 Days Discharge Instructions: Please do not put silicone bordered dressings under wraps. Use non-bordered dressing only. 1. Keystone antibiotic ointment with Hydrofera Blue 2. Follow-up in 2 weeks 3. Compression stockings daily Electronic Signature(s) Signed: 07/11/2022 4:13:40 PM By: Kalman Shan DO Entered By: Kalman Shan on 07/11/2022 16:12:28 -------------------------------------------------------------------------------- ROS/PFSH Details Patient Name: Date of Service: [place], [place]. 07/11/2022 3:45 PM Medical Record Number: [id number] Patient Account Number: [id number] Date of Birth/Sex: Treating RN: 1985-11-18 (37 y.o. Orvan Falconer Primary Care Provider: [name] Other Clinician: Referring Provider: Treating Provider/Extender: [name] in Treatment: 12 Information Obtained From Patient Cardiovascular Medical History: FLAVIA, MCCARVER ([id number])  [id number] Page 9 of 9 Positive for: Hypertension; Peripheral Venous Disease Endocrine Medical History: Past Medical History Notes: pre DM, A1C 5.5 12/21 Immunizations Pneumococcal Vaccine: Received Pneumococcal Vaccination: No Implantable Devices None Hospitalization / Surgery History Type of Hospitalization/Surgery umb hernia repair right lower leg vein surgery Family and Social History Never smoker; Marital Status - Single; Alcohol Use: Rarely; Drug Use: Current History - marijuana; Caffeine Use: Rarely; Financial Concerns: No; Food, Clothing or Shelter Needs: No; Support System Lacking: No; Transportation Concerns: No Electronic Signature(s) Signed: 07/11/2022 4:13:40 PM By: Kalman Shan DO Signed: 07/11/2022 4:25:27 PM By: Carlene Coria RN Entered By: Kalman Shan on 07/11/2022 16:13:18 -------------------------------------------------------------------------------- SuperBill Details Patient Name: Date of Service: [place], [place] 07/11/2022 Medical Record Number: [id number] Patient Account Number: [id number] Date of Birth/Sex: Treating RN: 08-08-1985 (37 y.o. Orvan Falconer Primary Care Provider: [name] Other Clinician: Referring Provider: Treating Provider/Extender: [name] in Treatment: 12 Diagnosis Coding ICD-10 Codes Code Description [phone] Chronic venous hypertension (idiopathic) with ulcer of left lower extremity L97.822 Non-pressure chronic ulcer of other part of left lower leg with fat layer exposed I89.0 Lymphedema, not elsewhere classified Physician Procedures : CPT4 Code Description Modifier [id number] - WC PHYS LEVEL 3 - EST PT ICD-10 Diagnosis Description I87.312 Chronic venous hypertension (idiopathic) with ulcer of left lower extremity L97.822 Non-pressure chronic ulcer  of other part of left lower leg  with fat layer exposed I89.0 Lymphedema, not elsewhere  classified Quantity: 1 Electronic Signature(s) Signed: 07/11/2022 4:13:40 PM By: Kalman Shan DO Entered By: Kalman Shan on 07/11/2022 16:12:37

## 2022-07-25 ENCOUNTER — Encounter: Payer: BC Managed Care – PPO | Attending: Internal Medicine | Admitting: Internal Medicine

## 2022-07-25 DIAGNOSIS — L97822 Non-pressure chronic ulcer of other part of left lower leg with fat layer exposed: Secondary | ICD-10-CM | POA: Diagnosis not present

## 2022-07-25 DIAGNOSIS — I890 Lymphedema, not elsewhere classified: Secondary | ICD-10-CM | POA: Diagnosis not present

## 2022-07-25 DIAGNOSIS — I87312 Chronic venous hypertension (idiopathic) with ulcer of left lower extremity: Secondary | ICD-10-CM | POA: Insufficient documentation

## 2022-07-25 DIAGNOSIS — I10 Essential (primary) hypertension: Secondary | ICD-10-CM | POA: Diagnosis not present

## 2022-07-26 NOTE — Progress Notes (Signed)
[name], [name] [id number] Page 1 of 7 Visit Report for 07/25/2022 Arrival Information Details Patient Name: Date of Service: [name], [name] [place]. 07/25/2022 3:30 PM Medical Record Number: [id number] Patient Account Number: [id number] Date of Birth/Sex: Treating RN: [birth year] (37 y.o. [name] Primary Care Zainah Steven: [name] Other Clinician: [name] Referring Jj Enyeart: Treating Shavonte Zhao/Extender: Emeline Darling in Treatment: 14 Visit Information History Since Last Visit All ordered tests and consults were completed: No Patient Arrived: Ambulatory Added or deleted any medications: No Arrival Time: 15:40 Any new allergies or adverse reactions: No Transfer Assistance: None Had a fall or experienced change in No Patient Identification Verified: Yes activities of daily living that may affect Secondary Verification Process Completed: Yes risk of falls: Patient Requires Transmission-Based Precautions: No Signs or symptoms of abuse/neglect since last visito No Patient Has Alerts: No Hospitalized since last visit: No Implantable device outside of the clinic excluding No cellular tissue based products placed in the center since last visit: Has Dressing in Place as Prescribed: Yes Pain Present Now: Yes Electronic Signature(s) Signed: 07/25/2022 4:41:17 PM By: [name] Entered By: [name] on 07/25/2022 15:47:03 -------------------------------------------------------------------------------- Clinic Level of Care Assessment Details Patient Name: Date of Service: [name], [name] [place] 07/25/2022 3:30 PM Medical Record Number: [id number] Patient Account Number: [id number] Date of Birth/Sex: Treating RN: 03-Apr-1986 (37 y.o. [name] Primary Care Steward Sames: [name] Other Clinician: [name] Referring Farida Mcreynolds: Treating Jazmon Kos/Extender: Emeline Darling in  Treatment: 14 Clinic Level of Care Assessment Items TOOL 1 Quantity Score []  - 0 Use when EandM and Procedure is performed on INITIAL visit ASSESSMENTS - Nursing Assessment / Reassessment []  - 0 General Physical Exam (combine w/ comprehensive assessment (listed just below) when performed on new pt. evals) []  - 0 Comprehensive Assessment (HX, ROS, Risk Assessments, Wounds Hx, etc.) ASSESSMENTS - Wound and Skin Assessment / Reassessment []  - 0 Dermatologic / Skin Assessment (not related to wound area) ASSESSMENTS - Ostomy and/or Continence Assessment and Care []  - 0 Incontinence Assessment and Management []  - 0 Ostomy Care Assessment and Management (repouching, etc.) PROCESS - Coordination of Care []  - 0 Simple Patient / Family Education for ongoing care []  - 0 Complex (extensive) Patient / Family Education for ongoing care []  - 0 Staff obtains Chiropractor, Records, T Results / Process Orders est []  - 0 Staff telephones HHA, Nursing Homes / Clarify orders / etc []  - 0 Routine Transfer to another Facility (non-emergent condition) []  - 0 Routine Hospital Admission (non-emergent condition) AHNYA, AKRE ([id number]) [id number] Page 2 of 7 []  - 0 New Admissions / Manufacturing engineer / Ordering NPWT Apligraf, etc. , []  - 0 Emergency Hospital Admission (emergent condition) PROCESS - Special Needs []  - 0 Pediatric / Minor Patient Management []  - 0 Isolation Patient Management []  - 0 Hearing / Language / Visual special needs []  - 0 Assessment of Community assistance (transportation, D/C planning, etc.) []  - 0 Additional assistance / Altered mentation []  - 0 Support Surface(s) Assessment (bed, cushion, seat, etc.) INTERVENTIONS - Miscellaneous []  - 0 External ear exam []  - 0 Patient Transfer (multiple staff / Nurse, adult / Similar devices) []  - 0 Simple Staple / Suture removal (25 or less) []  - 0 Complex Staple / Suture removal (26 or  more) []  - 0 Hypo/Hyperglycemic Management (do not check if billed separately) []  - 0 Ankle / Brachial Index (ABI) - do not check if billed separately Has the patient been  seen at the hospital within the last three years: Yes Total Score: 0 Level Of Care: ____ Electronic Signature(s) Signed: 07/25/2022 4:41:17 PM By: [name] Entered By: [name] on 07/25/2022 16:06:16 -------------------------------------------------------------------------------- Encounter Discharge Information Details Patient Name: Date of Service: [name] Moody [name]. 07/25/2022 3:30 PM Medical Record Number: [id number] Patient Account Number: [id number] Date of Birth/Sex: Treating RN: [birth year] (37 y.o. [name] Primary Care Jaryan Chicoine: [name] Other Clinician: [name] Referring Muscab Brenneman: Treating Arabela Basaldua/Extender: Emeline Darling in Treatment: 14 Encounter Discharge Information Items Post Procedure Vitals Discharge Condition: Stable Temperature (F): 98.5 Ambulatory Status: Ambulatory Pulse (bpm): 105 Discharge Destination: Home Respiratory Rate (breaths/min): 18 Transportation: Private Auto Blood Pressure (mmHg): 100/79 Accompanied By: self Schedule Follow-up Appointment: Yes Clinical Summary of Care: Electronic Signature(s) Signed: 07/25/2022 4:41:17 PM By: [name] Entered By: [name] on 07/25/2022 16:26:11 -------------------------------------------------------------------------------- Lower Extremity Assessment Details Patient Name: Date of Service: [name], KOSE MA [name]. 07/25/2022 3:30 PM Medical Record Number: [id number] Patient Account Number: [id number] Date of Birth/Sex: Treating RN: 10-26-1985 (38 y.o. [name] Primary Care Undra Trembath: [name] Other Clinician: [name] Referring Sherlyn Ebbert: Treating Anoop Hemmer/Extender: Emeline Darling in Treatment: 14 Edema Assessment S[Left: Earl Many ([id number])] [name]: [id number] Page 3 of 7] Assessed: [Left: Yes] [Right: No] Edema: [Left: Ye] [Right: s] Calf Left: Right: Point of Measurement: 32 cm From Medial Instep 58.5 cm Ankle Left: Right: Point of Measurement: 12 cm From Medial Instep 34 cm Vascular Assessment Pulses: Dorsalis Pedis Palpable: [Left:Yes] Electronic Signature(s) Signed: 07/25/2022 4:41:17 PM By: [name] Signed: 07/25/2022 5:50:22 PM By: Elliot Gurney, [name], RN, CWS, [name], [name] Entered By: [name] on 07/25/2022 15:56:18 -------------------------------------------------------------------------------- Multi Wound Chart Details Patient Name: Date of Service: [name] Moody [name]. 07/25/2022 3:30 PM Medical Record Number: [id number] Patient Account Number: [id number] Date of Birth/Sex: Treating RN: 09/09/1985 (37 y.o. [name] Primary Care Zellie Jenning: [name] Other Clinician: [name] Referring Christabel Camire: Treating Jabri Blancett/Extender: Emeline Darling in Treatment: 14 Vital Signs Height(in): 66 Pulse(bpm): 105 Weight(lbs): 400 Blood Pressure(mmHg): 100/79 Body Mass Index(BMI): 64.6 Temperature(F): 98.5 Respiratory Rate(breaths/min): 18 [5:Photos:] [N/A:N/A] Left, Medial Lower Leg N/A N/A Wound Location: Gradually Appeared N/A N/A Wounding Event: Venous Leg Ulcer N/A N/A Primary Etiology: Hypertension, Peripheral Venous N/A N/A Comorbid History: Disease 04/10/2022 N/A N/A Date Acquired: 14 N/A N/A Weeks of Treatment: Open N/A N/A Wound Status: No N/A N/A Wound Recurrence: 1.7x3.3x0.2 N/A N/A Measurements L x W x D (cm) 4.406 N/A N/A A (cm) : rea 0.881 N/A N/A Volume (cm) : 19.90% N/A N/A % Reduction in Area: 19.90% N/A N/A % Reduction in Volume: Full Thickness Without Exposed N/A N/A Classification: Support Structures Medium N/A N/A Exudate Amount: Serosanguineous N/A N/A Exudate Type: red, brown  N/A N/A Exudate Color: Medium (34-66%) N/A N/A Granulation Amount: Red N/A N/A Granulation Quality: Medium (34-66%) N/A N/A Necrotic Amount: Fat Layer (Subcutaneous Tissue): Yes N/A N/A Exposed StructuresCURLEE, [name] ([id number]) [id number] Page 4 of 7 Fascia: No Tendon: No Muscle: No Joint: No Bone: No Small (1-33%) N/A N/A Epithelialization: Treatment Notes Electronic Signature(s) Signed: 07/25/2022 4:41:17 PM By: [name] Entered By: [name] on 07/25/2022 15:56:29 -------------------------------------------------------------------------------- Multi-Disciplinary Care Plan Details Patient Name: Date of Service: [name] Moody [name]. 07/25/2022 3:30 PM Medical Record Number: [id number] Patient Account Number: [id number] Date of Birth/Sex: Treating RN: [birth year] (37 y.o. [name] Primary Care Creig Landin: [name] Other Clinician: [name] Referring Ireland Chagnon: Treating  Brisha Mccabe/Extender: Emeline Darling in Treatment: 14 Active Inactive Wound/Skin Impairment Nursing Diagnoses: Knowledge deficit related to ulceration/compromised skin integrity Goals: Patient/caregiver will verbalize understanding of skin care regimen Date Initiated: 04/18/2022 Target Resolution Date: 07/18/2022 Goal Status: Active Ulcer/skin breakdown will have a volume reduction of 30% by week 4 Date Initiated: 04/18/2022 Date Inactivated: 07/04/2022 Target Resolution Date: 05/19/2022 Goal Status: Unmet Unmet Reason: comorbidities Ulcer/skin breakdown will have a volume reduction of 50% by week 8 Date Initiated: 04/18/2022 Date Inactivated: 07/04/2022 Target Resolution Date: 06/18/2022 Goal Status: Unmet Unmet Reason: comorbidities Ulcer/skin breakdown will have a volume reduction of 80% by week 12 Date Initiated: 04/18/2022 Target Resolution Date: 07/18/2022 Goal Status: Active Ulcer/skin breakdown will heal within 14 weeks Date  Initiated: 04/18/2022 Target Resolution Date: 08/17/2022 Goal Status: Active Interventions: Assess patient/caregiver ability to obtain necessary supplies Assess patient/caregiver ability to perform ulcer/skin care regimen upon admission and as needed Assess ulceration(s) every visit Notes: Electronic Signature(s) Signed: 07/25/2022 4:41:17 PM By: [name] Signed: 07/25/2022 5:50:22 PM By: Elliot Gurney, [name], RN, CWS, [name], [name] Entered By: [name] on 07/25/2022 16:07:09 -------------------------------------------------------------------------------- Pain Assessment Details Patient Name: Date of Service: [name] Moody MA [name]. 07/25/2022 3:30 PM Medical Record Number: [id number] Patient Account Number: [id number] Date of Birth/Sex: Treating RN: [birth year] (37 y.o. [name] Primary Care Terrisa Curfman: [name] Other Clinician: [name] Referring Adabella Stanis: Treating Samariyah Cowles/Extender: Emeline Darling in Treatment: [address] [place] [name] ([id number]) [id number] Page 5 of 7 Active Problems Location of Pain Severity and Description of Pain Patient Has Paino Yes Site Locations Pain Location: Pain in Ulcers Duration of the Pain. Constant / Intermittento Constant Rate the pain. Current Pain Level: 7 Character of Pain Describe the Pain: Throbbing Pain Management and Medication Current Pain Management: Medication: Yes Cold Application: No Rest: No Massage: No Activity: No T.E.N.S.: No Heat Application: No Leg drop or elevation: No Is the Current Pain Management Adequate: Inadequate How does your wound impact your activities of daily livingo Sleep: No Bathing: No Appetite: No Relationship With Others: No Bladder Continence: No Emotions: No Bowel Continence: No Work: No Toileting: No Drive: No Dressing: No Hobbies: No Electronic Signature(s) Signed: 07/25/2022 4:41:17 PM By: [name] Signed: 07/25/2022 5:50:22  PM By: Elliot Gurney, [name], RN, CWS, [name], [name] Entered By: [name] on 07/25/2022 15:54:26 -------------------------------------------------------------------------------- Patient/Caregiver Education Details Patient Name: Date of Service: [name] 4/10/2024andnbsp3:30 PM Medical Record Number: [id number] Patient Account Number: [id number] Date of Birth/Gender: Treating RN: 16-Feb-1986 (37 y.o. [name] Primary Care Physician: [name] Other Clinician: [name] Referring Physician: Treating Physician/Extender: Emeline Darling in Treatment: 14 Education Assessment Education Provided To: Patient Education Topics Provided Venous: Handouts: Controlling Swelling with Compression Stockings Methods: Explain/Verbal Responses: State content correctly Wound Debridement: Methods: Explain/Verbal TANIELLE, ROESKE ([id number]) [id number] Page 6 of 7 Responses: State content correctly Electronic Signature(s) Signed: 07/25/2022 4:41:17 PM By: [name] Entered By: [name] on 07/25/2022 16:23:51 -------------------------------------------------------------------------------- Wound Assessment Details Patient Name: Date of Service: [name] Moody MA [name]. 07/25/2022 3:30 PM Medical Record Number: [id number] Patient Account Number: [id number] Date of Birth/Sex: Treating RN: July 21, 1985 (37 y.o. [name] Primary Care Louvinia Cumbo: [name] Other Clinician: [name] Referring Keimon Basaldua: Treating Keziyah Kneale/Extender: Emeline Darling in Treatment: 14 Wound Status Wound Number: 5 Primary Etiology: Venous Leg Ulcer Wound Location: Left, Medial Lower Leg Wound Status: Open Wounding Event: Gradually Appeared Comorbid History: Hypertension, Peripheral Venous Disease Date Acquired: 04/10/2022 [name]  Of Treatment: 14 Clustered Wound: No Photos Wound Measurements Length: (cm)  1.7 Width: (cm) 3.3 Depth: (cm) 0.2 Area: (cm) 4.406 Volume: (cm) 0.881 % Reduction in Area: 19.9% % Reduction in Volume: 19.9% Epithelialization: Small (1-33%) Wound Description Classification: Full Thickness Without Exposed Support Structures Exudate Amount: Medium Exudate Type: Serosanguineous Exudate Color: red, brown Foul Odor After Cleansing: No Slough/Fibrino Yes Wound Bed Granulation Amount: Medium (34-66%) Exposed Structure Granulation Quality: Red Fascia Exposed: No Necrotic Amount: Medium (34-66%) Fat Layer (Subcutaneous Tissue) Exposed: Yes Necrotic Quality: Adherent Slough Tendon Exposed: No Muscle Exposed: No Joint Exposed: No Bone Exposed: No Treatment Notes Wound #5 (Lower Leg) Wound Laterality: Left, Medial Cleanser Soap and Water Discharge Instruction: Gently cleanse wound with antibacterial soap, rinse and pat dry prior to dressing wounds Vashe 5.8 (oz) Discharge Instruction: Use vashe 5.8 (oz) as directed Conley SimmondsSUMMERS, [name] ([id number]) [id number] Page 7 of 7 Peri-Wound Care AandD Ointment Discharge Instruction: Apply AandD Ointment as directed around edges to help dressing from sticking Topical keystone Primary Dressing Hydrofera Blue Ready Transfer Foam, 2.5x2.5 (in/in) Discharge Instruction: Apply Hydrofera Blue Ready to wound bed as directed cut to fit wound bed Secondary Dressing (BORDER) Zetuvit Plus SILICONE BORDER Dressing 4x4 (in/in) Discharge Instruction: Please do not put silicone bordered dressings under wraps. Use non-bordered dressing only. Secured With Compression Wrap Compression Stockings Facilities managerAdd-Ons Electronic Signature(s) Signed: 07/25/2022 4:41:17 PM By: Betha LoaVenable, Angie Signed: 07/25/2022 5:50:22 PM By: Elliot GurneyWoody, [name], RN, CWS, [name], [name] Entered By: Betha LoaVenable, Angie on 07/25/2022 15:55:03 -------------------------------------------------------------------------------- Vitals Details Patient Name: Date of  Service: [name], SHA MA [name]. 07/25/2022 3:30 PM Medical Record Number: [id number] Patient Account Number: [id number] Date of Birth/Sex: Treating RN: 03/28/1986 (37 y.o. Cathlean CowerF) [name], [name] Primary Care Karima Carrell: [name], [name] Other Clinician: Betha LoaVenable, Angie Referring Saprina Chuong: Treating Skyleen Bentley/Extender: Emeline DarlingHoffman, Jessica Quillen, [name] Weeks in Treatment: 14 Vital Signs Time Taken: 15:47 Temperature (F): 98.5 Height (in): 66 Pulse (bpm): 105 Weight (lbs): 400 Respiratory Rate (breaths/min): 18 Body Mass Index (BMI): 64.6 Blood Pressure (mmHg): 100/79 Reference Range: 80 - 120 mg / dl Electronic Signature(s) Signed: 07/25/2022 4:41:17 PM By: Betha LoaVenable, Angie Entered By: Betha LoaVenable, Angie on 07/25/2022 15:49:43

## 2022-07-26 NOTE — Progress Notes (Signed)
[name] ([id number]) [id number] Page 1 of 10 Visit Report for 07/25/2022 Chief Complaint Document Details Patient Name: Date of Service: [name], [name] [place]. 07/25/2022 3:30 PM Medical Record Number: [id number] Patient Account Number: [id number] Date of Birth/Sex: Treating RN: 03/19/1986 (37 y.o. [name] Primary Care Provider: [name] Other Clinician: [name] Referring Provider: Treating Provider/Extender: Emeline Darling in Treatment: 14 Information Obtained from: Patient Chief Complaint 04/18/2022; Left LE Ulcer Electronic Signature(s) Signed: 07/25/2022 4:29:14 PM By: Geralyn Corwin DO Entered By: Geralyn Corwin on 07/25/2022 16:09:42 -------------------------------------------------------------------------------- Debridement Details Patient Name: Date of Service: [name] Moody MA J. 07/25/2022 3:30 PM Medical Record Number: [id number] Patient Account Number: [id number] Date of Birth/Sex: Treating RN: Apr 27, 1985 (37 y.o. Cathlean Cower, Kim Primary Care Provider: [name] Other Clinician: [name] Referring Provider: Treating Provider/Extender: Emeline Darling in Treatment: 14 Debridement Performed for Assessment: Wound #5 Left,Medial Lower Leg Performed By: Physician Geralyn Corwin, MD Debridement Type: Debridement Severity of Tissue Pre Debridement: Fat layer exposed Level of Consciousness (Pre-procedure): Awake and Alert Pre-procedure Verification/Time Out Yes - 16:04 Taken: Start Time: 16:04 T Area Debrided (L x W): otal 1 (cm) x 1 (cm) = 1 (cm) Tissue and other material debrided: Viable, Non-Viable, Slough, Slough Level: Non-Viable Tissue Debridement Description: Selective/Open Wound Instrument: Curette Bleeding: Minimum Hemostasis Achieved: Pressure Response to Treatment: Procedure was tolerated well Level of Consciousness (Post- Awake and  Alert procedure): Post Debridement Measurements of Total Wound Length: (cm) 1.7 Width: (cm) 3.3 Depth: (cm) 0.2 Volume: (cm) 0.881 Character of Wound/Ulcer Post Debridement: Stable Severity of Tissue Post Debridement: Fat layer exposed Post Procedure Diagnosis Same as Pre-procedure Electronic Signature(s) Signed: 07/25/2022 4:29:14 PM By: Geralyn Corwin DO Signed: 07/25/2022 4:41:17 PM By: [name] Signed: 07/25/2022 5:50:22 PM By: Elliot Gurney, [name], RN, CWS, [name], [name] Entered By: [name] on 07/25/2022 16:05:19 Mumpower, [name] ([id number]) [id number] Page 2 of 10 -------------------------------------------------------------------------------- HPI Details Patient Name: Date of Service: [name] [place] 07/25/2022 3:30 PM Medical Record Number: [id number] Patient Account Number: [id number] Date of Birth/Sex: Treating RN: 1985/12/26 (37 y.o. [name] Primary Care Provider: [name] Other Clinician: [name] Referring Provider: Treating Provider/Extender: Emeline Darling in Treatment: 14 History of Present Illness HPI Description: 37 year old patient who started with having ulcerations on the right lower leg on the lateral part of her ankle for about 2 weeks. She was seen in the ER at [facility] and advised to see the wound care for a consultation. No X-rays of workup was done during the ER visit and no prescription for any medications of compression wraps were given. the patient is not diabetic but does have hypertension and her medications have been reviewed by me. 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 The right and left greater saphenous vein was found to be tortuous. Deep venous system was also not competent and there was reflux of  more than 500 ms. She was then seen by Dr. Karie Schwalbe Early who recommended that the patient would not benefit from endovenous ablation and he had recommended vein stripping odd on the right side and multiple small phlebectomy procedures on the left side. the patient did not follow-up due to social economic reasons. She has not been wearing any compression stockings and has not taken any  specific treatment for varicose veins for the last 3 years. 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 We have obtained some appointments to see the vascular surgeons by the end of the month and the patient would like to follow up with me at my [facility] on Wednesday, June 29. 10/14/2014 -- she could not see me yesterday in [place] and hence has come for a review today. She has a vascular workup to be done this afternoon at [facility]. She is doing fine otherwise. 10/22/2014 -- she was seen by Dr. Brantley Fling and he has recommended surgical removal of her right saphenous vein from distal thigh to saphenofemoral odd junction and stab phlebectomy's of multiple large tributary branches throughout her thigh and calf. This would be done under general anesthesia in the outpatient setting. 10/29/2014 -- she is trying to work on a surgical date and in the meanwhile we have got insurance clearance for Apligraf and we will start this next week. 7/22 2016 -- she is here for the first application of Apligraf. 11/19/2014 -- she is here for a second application of Apligraf 11/26/2014 -- she has done fine after her last application of Apligraf and is awaiting her surgery which is scheduled for August 31. 12/03/2014 -- she is doing fine and is here for her third application of Apligraf. 12/21/2014 -- She had surgery on 12/15/2014 by Dr.Early who did #1 ligation and stripping of right great saphenous vein from distal thigh to  saphenofemoral junction, #2 stab phlebectomy of large tributary varicose veins in the thigh popliteal space and calf. She had an Ace wrap up to her groin and this was removed today and the Unna's boot was also removed. 12/28/2014 -- she is here for her fourth application of Apligraf. 01/06/2015 - he saw her vascular surgeon Dr. Arbie Cookey who was pleased with her progress and he has confirmed that no surgical procedures could be attempted on the left side. 01/13/2015 -- her wound looks very good and she's been having no problems whatsoever. Readmission: 07/26/2020 upon evaluation today patient presents for initial inspection here in our clinic for a new issue with her left leg although she is previously been seen due to issues with the right leg back in 2016. At that time she was seeing Dr. Arbie Cookey who is a vein/vascular specialist in [place]. He has since semiretired from what I understand. He is working out of [employer] I believe. Nonetheless she tells me at the time that there was really nothing to do for her left leg although the right leg was where they did most of the work. Subsequently she states that she is done fairly well until just in the past week where she had issues with bleeding from what appears to be varicose vein on the left leg medially. Unfortunately this has continued to be an issue although she tells me at first it was coming much more significantly Down quite a bit but nonetheless has not completely resolved. Every time she showers she notices that it starts to drain a little bit more. She does have a history of chronic venous insufficiency, lymphedema, varicose veins bilaterally, and obesity. 08/02/2020 upon evaluation today patient appears to be doing about the same in regards to the ulcer on her left leg. She has some eschar covering there is definitely some fluid collecting underneath unfortunately. With that being said she tells me she is still having  a tremendous amount of  pain therefore she is really not able to allow me to clean this off very effectively to be perfectly honest. I think we need to try to soften this up 08/16/2020 upon evaluation today patient's wound is really not doing significantly better not really states about the same. She notes that the wrap just does not seem to be staying up very well at all unfortunately. No fevers, chills, nausea, vomiting, or diarrhea. She did cut it off once it starts to slide in order to alleviate some of the pressure from sliding Down. Fortunately there is no signs of active infection at this time which is great news. 08/23/2020 upon evaluation today patient appears to be doing well 08/23/2020 upon evaluation today patient appears to be doing well with regard to her wound all things considered. Fortunately there does not appear to be any signs of active infection at this time which is great news. She has been tolerating the dressing changes without complication and overall I am extremely pleased with where things stand at this point. She does have her appointment with vascular in [facility] on June 9. 08/30/2020 upon evaluation today patient actually appears to be doing decently well in regard to her wound. Fortunately there is no signs of active infection which is great news. Nonetheless I do believe that the patient is going require little bit of debridement if she is okay with me attempting that today I think that will help clean off some of the necrotic tissue. Fortunately there does not appear to be otherwise any evidence of active infection which is also great news. 09/19/2020 upon evaluation today patient appears to be doing a little better in regard to her wound as compared to previous. Fortunately there does not appear to be any signs of active infection overall. No fever chills noted. I do believe that the Iodosorb has made this a little bit better with regard to the overall size and appearance of the wound bed though  again she does still have quite a ways to go to get this to heal she still very tender to touch. 09/27/2020 upon evaluation today patient appears to actually be doing quite well with regard to her wound. This is measuring much smaller which is great news. With that being said she did see vein and vascular in [facility] and they subsequently recommended that surgery is really what she probably needs to go forward with sounds like the potential for venous ablation. With that being said the patient tells me this is just not the right time for her to be able to proceed with any type of surgery which I completely understand. Nonetheless I do believe that she would continue to benefit from compression but again that is really not something that she is able to easily do. 10/04/2020 upon evaluation today patient appears to be doing about the same in regard to her wound. This is measuring a little bit smaller but nonetheless still is MARIANGEL, RINGLEY ([id number]) [id number] Page 3 of 10 open and again has some slough and biofilm noted on the surface of the wound. There does not appear to be any signs of active infection which is great news. No fevers, chills, nausea, vomiting, or diarrhea. 10/04/2020 upon evaluation today patient appears to be doing well with regard to her wound. She has been tolerating dressing changes without complication. Fortunately there does not appear to be any signs of active infection which is great news. No fevers, chills, nausea, vomiting, or  diarrhea. 10/25/2020 upon evaluation today patient appears to be doing well with regard to her wound. She has been tolerating the dressing changes without complication. Fortunately there is no signs of active infection at this time. No fevers, chills, nausea, vomiting, or diarrhea. 11/01/2020 upon evaluation today patient with regard to her wound. She has been tolerating the dressing changes without complication.  Fortunately there does not appear to be any signs of infection which is great news. No fever chills noted 11/15/2020 upon evaluation today patient appears to be doing well with regard to her wound. Fortunately there is no signs of active infection at this time. No fevers, chills, nausea, vomiting, or diarrhea. With that being said she continues to have a significant amount of pain at the site even though this is very close to complete closure. She also had several varicose veins around the area which were also problematic. Overall however I feel like the patient is making excellent progress. 11/28/2020 upon evaluation today patient appears to be doing well with regard to her leg ulcer. Again were not really able to debride or compression wrap her due to discomfort and pain. She does not allow for that. With that being said we have been using Iodosorb which does seem to be doing decently well. Fortunately there is no signs of active infection at this time which is great news. No fevers, chills, nausea, vomiting, or diarrhea. 8/31; patient presents for 2-week follow-up. She has been using Iodosorb. She reports that the wound is closed. She denies signs of infection. Readmission: 10-27-2021 upon evaluation this is a patient that presents today whom I have previously seen this is pretty much for the same issue though I think a little bit higher than the last time I saw her. She does have a history of chronic venous insufficiency and hypertension along with varicose veins. Subsequently she does have an ulceration which spontaneously ruptured she has not been wearing any compression which I think is a big part of the issue here. We discussed this before I really think she probably needs to be wearing her compression therapy, she probably needs lymphedema pumps if she can ever wear the compression for a significant amount of time to get these, and subsequently also think that she needs to be elevating her legs  is much as possible she may even need some vascular intervention in regard to her veins. All of this was reiterated and discussed with her today to reinforce what needs to happen in order to ensure that her legs do not get a lot worse. The patient voiced understanding. She tells me that she knows because she is seeing her mom go through a lot of this as well how bad things can get. 11-03-2021 upon evaluation today patient appears to be doing well with regard to her wound. Fortunately there does not appear to be any signs of active infection at this time. She is measuring a little bit bigger but I think this is because the wound is actually cleaning up a bit here. 7/27; left lateral leg wound not any smaller but perhaps with a cleaner surface. She is using Iodoflex to help with the latter and using Tubigrip. She has chronic venous insufficiency with secondary lymphedema. She is apparently followed by vein and vascular and is being scheduled for an ablation 11-17-2021 upon evaluation today patient appears to be doing well with regard to her wound this is actually showing signs of improvement which is great news. Fortunately I do not see  any evidence of active infection locally or systemically at this time which is great news. No fevers, chills, nausea, vomiting, or diarrhea. 11-24-2021 upon evaluation today patient's wound is actually showing signs of significant improvement. Unfortunately she had quite a bit of pain with debridement last week I do believe it was beneficial but at the same time she is doing much better but still really does not want this debrided again I think being that it is looking a whole lot better I would try to avoid that today especially since it caused her so much discomfort that is really not the goal and I explained that to the patient today she voiced understanding and knows that it needed to be done but still states that it was quite painful. 11-30-2021 upon evaluation today  patient appears to be doing excellent in regard to her wound this is actually showing signs of excellent improvement I am very pleased with where things stand. She does have her venous ablation appointment for October 17. 12-21-2021 upon evaluation today patient appears to be doing better in regard to her wound this is measuring smaller and looking better as well. Fortunately I do not see any signs of active infection locally or systemically which is great news. 01-01-2022 upon evaluation today patient appears to be doing well currently in regard to her wound. She is showing signs of improvement which is great news and overall I do not see any signs of active infection locally or systemically at this time. 01-08-2022 upon evaluation today patient appears to be doing well currently in regard to her wound she is actually showing signs of significant improvement which is great news. Fortunately I do not see any evidence of active infection locally or systemically at this time. I do believe that we are on the right track. She also has her appointment October 19 for the venous ablation. 01-16-2022 upon evaluation today patient's wound actually is showing some signs of improvement although this is very slow. Fortunately I do not see any evidence of infection at this time. The volume is a little bit more although the size is smaller I think this is due to the fact that we are slowly cleaning this area out effectively. 11/6 continued improvement the patient is using Iodoflex and a Tubigrip E. She was supposed to have venous surgery at vein and vascular in [place] however somehow this is gotten delayed till December 14. 02-27-2022 upon evaluation today patient's wound is actually showing signs of being completely healed. Fortunately I do not see any evidence of active infection locally or systemically which is great news and overall I am extremely pleased with where we stand today. Unfortunately she does tell me  that she postpone her venous ablation surgery until December 14 she tells me that she was not ready "financially" for this. 04/18/2022; Ms. Brittnay Pigman is a 37 year old female with a past medical history of venous insufficiency that presents the clinic for a left lower extremity wound. She was seen almost 2 months ago for the same wound. This had healed with Iodoflex and Tubigrip. She states that the wound recently reopened. She has seen vein and vascular and plan is for ligation and stripping of the left great saphenous vein. She is not sure when this procedure is going to be scheduled. She has canceled it once before. She has had office compression wraps in the past however these do not stay on and create more of an issue for her. She would like to avoid  this. She currently denies signs of infection. 1/10; patient presents for follow-up. She has been using Iodoflex to the wound bed. She states she has been using her Tubigrip however she does not have this on today. She has no issues or complaints today. She denies signs of infection. 1/17; patient presents for follow-up. She has not been using Iodoflex to the wound bed. She reports acute pain. She denies increased warmth, erythema or purulent drainage to the left lower extremity. She states she has been using Tubigrip. She has information to order the compression stockings but has not obtained them. 1/24; Patient had a wound culture done at last clinic visit that grew Proteus mirabilis and E. coli. She was prescribed levofloxacin and this should cover both bacteria. She has been taking the medication over the past week with improvement of symptoms to the wound bed. She has been using Hydrofera Blue under Tubigrip. She states the [facility] is sticking to her wound bed. 1/31; patient presents for follow-up. She has been using Medihoney to the wound bed with improvement in healing. She has been contacted by [facility] to order her antibiotic  ointment. She is not sure yet if she is able to afford this. She received her compression stockings but states they did not fit well. She is working on returning these. She has been using Tubigrip. ANTHONETTE, LESAGE ([id number]) [id number] Page 4 of 10 2/7; patient presents for follow-up. She is been using Medihoney to the wound bed. She obtained her compression stockings and has been using these daily. She states that the [facility] antibiotic ointment is arriving in the mail tomorrow. She has no issues or complaints today. She denies systemic signs of infection. 2/14; patient presents for follow-up. She has been using Keystone antibiotic ointment to the wound bed. She states the [facility] is sticking and she has stopped this. She is been using her compression stockings daily. She does not have these on today. 2/28; patient presents for follow-up. She continues to use [facility] antibiotic ointment to the wound bed. There is been improvement in healing. She has been using her compression stockings daily. 3/12; patient presents for follow-up. She has been using Keystone antibiotic ointment to the wound bed. She is not wearing her compression stockings today but states she does wear them daily. She declines debridement today. 3/20; patient presents for follow-up. She has been using Keystone antibiotic ointment with Hydrofera Blue. She states she is wearing her compression stockings daily but she does not have them on today. 3/27; patient presents for follow-up. She has been using Keystone antibiotic ointment and hydrofera blue to the wound bed. She reports wearing compression stockings. 4/10; patient presents for follow-up. She has been using Keystone antibiotic ointment and Hydrofera Blue to the wound bed. She reports wearing compression stockings. She has no issues or complaints today. Electronic Signature(s) Signed: 07/25/2022 4:29:14 PM By: Geralyn Corwin  DO Entered By: Geralyn Corwin on 07/25/2022 16:10:06 -------------------------------------------------------------------------------- Physical Exam Details Patient Name: Date of Service: [name] Moody MA J. 07/25/2022 3:30 PM Medical Record Number: [id number] Patient Account Number: [id number] Date of Birth/Sex: Treating RN: 02/11/1986 (37 y.o. [name] Primary Care Provider: [name] Other Clinician: [name] Referring Provider: Treating Provider/Extender: Emeline Darling in Treatment: 14 Constitutional . Cardiovascular . Psychiatric . Notes Left lower extremity: T the distal medial aspect there is an open wound with granulation tissue and slough. No increased warmth, erythema or purulent o drainage. Stage III lymphedema Electronic Signature(s) Signed: 07/25/2022  4:29:14 PM By: Geralyn Corwin DO Entered By: Geralyn Corwin on 07/25/2022 16:10:54 -------------------------------------------------------------------------------- Physician Orders Details Patient Name: Date of Service: [name] Moody MA J. 07/25/2022 3:30 PM Medical Record Number: [id number] Patient Account Number: [id number] Date of Birth/Sex: Treating RN: [birth year] (37 y.o. [name] Primary Care Provider: [name] Other Clinician: [name] Referring Provider: Treating Provider/Extender: Emeline Darling in Treatment: 14 Verbal / Phone Orders: Yes Clinician: [name] Read Back and Verified: Yes Diagnosis Coding Follow-up Appointments Wound #5 Left,Medial Lower Leg Return Appointment in 2 weeks. [address] [place], [place] ([id number]) [id number] Page 5 of 10 Wash wounds with antibacterial soap and water. May shower with wound dressing protected with water repellent cover or cast protector. No tub bath. Edema Control - Lymphedema / Segmental Compressive Device / Other Wound #5  Left,Medial Lower Leg Patient to wear own compression stockings. Remove compression stockings every night before going to bed and put on every morning when getting up. - LLE, Elevate, Exercise Daily and A void Standing for Long Periods of Time. Elevate legs to the level of the heart and pump ankles as often as possible Elevate leg(s) parallel to the floor when sitting. Medications-Please add to medication list. Wound #5 Left,Medial Lower Leg Keystone Compound Wound Treatment Wound #5 - Lower Leg Wound Laterality: Left, Medial Cleanser: Soap and Water 1 x Per Day/30 Days Discharge Instructions: Gently cleanse wound with antibacterial soap, rinse and pat dry prior to dressing wounds Cleanser: Vashe 5.8 (oz) 1 x Per Day/30 Days Discharge Instructions: Use vashe 5.8 (oz) as directed Peri-Wound Care: AandD Ointment 1 x Per Day/30 Days Discharge Instructions: Apply AandD Ointment as directed around edges to help dressing from sticking Topical: keystone 1 x Per Day/30 Days Prim Dressing: Hydrofera Blue Ready Transfer Foam, 2.5x2.5 (in/in) 1 x Per Day/30 Days ary Discharge Instructions: Apply Hydrofera Blue Ready to wound bed as directed cut to fit wound bed Secondary Dressing: (BORDER) Zetuvit Plus SILICONE BORDER Dressing 4x4 (in/in) 1 x Per Day/30 Days Discharge Instructions: Please do not put silicone bordered dressings under wraps. Use non-bordered dressing only. Electronic Signature(s) Signed: 07/25/2022 4:29:14 PM By: Geralyn Corwin DO Entered By: Geralyn Corwin on 07/25/2022 16:12:25 -------------------------------------------------------------------------------- Problem List Details Patient Name: Date of Service: [name] Moody MA J. 07/25/2022 3:30 PM Medical Record Number: [id number] Patient Account Number: [id number] Date of Birth/Sex: Treating RN: 1986-01-30 (37 y.o. 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 07/25/2022 3:30 PM Medical Record Number: [id number] Patient Account Number: [id number] Date of Birth/Sex: Treating RN: [birth year] (36 y.o. [name] Primary Care Provider: [name] Other Clinician: [name] Referring Provider: Treating Provider/Extender: Emeline Darling in Treatment: 14 Subjective Chief Complaint Information obtained from Patient 04/18/2022; Left LE Ulcer History of Present Illness (HPI) 37 year old patient who started with having ulcerations on the right lower leg  on the lateral part of her ankle for about 2 weeks. She was seen in the ER at [facility] and advised to see the wound care for a consultation. No X-rays of workup was done during the ER visit and no prescription for any medications of compression wraps were given. the patient is not diabetic but does have hypertension and her medications have been reviewed by me. 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 The right and left greater saphenous vein was found to be tortuous. Deep venous system was also not competent and there was reflux of more than 500 ms. She was then seen by Dr. Karie Schwalbe Early who recommended that the patient would not benefit from endovenous ablation and he had recommended vein stripping odd on the right side and multiple small phlebectomy procedures on the left side. the patient did not follow-up due to social economic reasons. She has not been wearing any compression stockings and has not taken any specific treatment for varicose veins for the last 3 years. 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 We have obtained some appointments to see the vascular surgeons by the end of the month and the patient would like to follow up with me at my [facility] on Wednesday, June 29. 10/14/2014 -- she could not see me yesterday in [place] and hence has come for a review today. She has a vascular workup to be done this afternoon at [facility]. She is doing fine otherwise. 10/22/2014 -- she was seen by Dr. Brantley Fling and he has recommended surgical removal of her right saphenous vein from distal thigh to saphenofemoral odd junction and stab phlebectomy's of multiple large tributary branches throughout her thigh and calf. This would be done under general anesthesia in the outpatient setting. 10/29/2014 -- she is trying to work on a surgical date and in the meanwhile we have got insurance clearance for Apligraf and we will start this next week. 7/22 2016 -- she is here for the first application of Apligraf. 11/19/2014 -- she is here for a second application of Apligraf 11/26/2014 -- she has done fine after her last application of Apligraf and is awaiting her surgery which is scheduled for August 31. 12/03/2014 -- she is doing fine and is here for her third  application of Apligraf. 12/21/2014 -- She had surgery on 12/15/2014 by Dr.Early who did #1 ligation and stripping of right great saphenous vein from distal thigh to saphenofemoral junction, #2 stab phlebectomy of large tributary varicose veins in the thigh popliteal space and calf. She had an Ace wrap up to her groin and this was removed today and the Unna's boot was also removed. 12/28/2014 -- she is here for her fourth application of Apligraf. 01/06/2015 - he saw her vascular surgeon Dr. Arbie Cookey who was pleased with her progress and he has confirmed that no surgical procedures could be attempted on the left side. 01/13/2015 -- her wound looks very good and she's been having no problems whatsoever. Readmission: 07/26/2020 upon evaluation today patient presents for initial inspection here in our clinic for a new issue with her left leg although she is previously been seen due to issues with the right leg back in 2016. At that time  she was seeing Dr. Arbie Cookey who is a Research officer, political party in [place]. He has since semiretired from what I understand. He is working out of [employer] I believe. Nonetheless she tells me at the time that there was really nothing to do for her left leg although the right leg was where they did most of the work. Subsequently she states that she is done fairly well until just in the past week where she had issues with bleeding from what appears to be varicose vein on the left leg medially. Unfortunately this has continued to be an issue although she tells me at first it was coming much more significantly Down quite a bit but nonetheless has not completely resolved. Every time she showers she notices that it starts to drain a little bit more. She does have a history of chronic venous insufficiency, lymphedema, varicose veins bilaterally, and obesity. 08/02/2020 upon evaluation today patient appears to be doing about the same in regards to the ulcer on her left leg. She has  some eschar covering there is definitely some fluid collecting underneath unfortunately. With that being said she tells me she is still having a tremendous amount of pain therefore she is really not able to allow me to clean this off very effectively to be perfectly honest. I think we need to try to soften this up 08/16/2020 upon evaluation today patient's wound is really not doing significantly better not really states about the same. She notes that the wrap just does not seem to be staying up very well at all unfortunately. No fevers, chills, nausea, vomiting, or diarrhea. She did cut it off once it starts to slide in order to alleviate some of the pressure from sliding Down. Fortunately there is no signs of active infection at this time which is great news. 08/23/2020 upon evaluation today patient appears to be doing well 08/23/2020 upon evaluation today patient appears to be doing well with regard to her wound all things considered. Fortunately there does not appear to be any signs of active infection at this time which is great news. She has been tolerating the dressing changes without complication and overall I am extremely pleased with where things stand at this point. She does have her appointment with vascular in [facility] on June 9. 08/30/2020 upon evaluation today patient actually appears to be doing decently well in regard to her wound. Fortunately there is no signs of active infection MIRYAM, MCELHINNEY ([id number]) [phone].pdf Page 7 of 10 which is great news. Nonetheless I do believe that the patient is going require little bit of debridement if she is okay with me attempting that today I think that will help clean off some of the necrotic tissue. Fortunately there does not appear to be otherwise any evidence of active infection which is also great news. 09/19/2020 upon evaluation today patient appears to be doing a little better in regard to her wound as compared to  previous. Fortunately there does not appear to be any signs of active infection overall. No fever chills noted. I do believe that the Iodosorb has made this a little bit better with regard to the overall size and appearance of the wound bed though again she does still have quite a ways to go to get this to heal she still very tender to touch. 09/27/2020 upon evaluation today patient appears to actually be doing quite well with regard to her wound. This is measuring much smaller which is great news. With that being said she did  see vein and vascular in [place] and they subsequently recommended that surgery is really what she probably needs to go forward with sounds like the potential for venous ablation. With that being said the patient tells me this is just not the right time for her to be able to proceed with any type of surgery which I completely understand. Nonetheless I do believe that she would continue to benefit from compression but again that is really not something that she is able to easily do. 10/04/2020 upon evaluation today patient appears to be doing about the same in regard to her wound. This is measuring a little bit smaller but nonetheless still is open and again has some slough and biofilm noted on the surface of the wound. There does not appear to be any signs of active infection which is great news. No fevers, chills, nausea, vomiting, or diarrhea. 10/04/2020 upon evaluation today patient appears to be doing well with regard to her wound. She has been tolerating dressing changes without complication. Fortunately there does not appear to be any signs of active infection which is great news. No fevers, chills, nausea, vomiting, or diarrhea. 10/25/2020 upon evaluation today patient appears to be doing well with regard to her wound. She has been tolerating the dressing changes without complication. Fortunately there is no signs of active infection at this time. No fevers, chills, nausea,  vomiting, or diarrhea. 11/01/2020 upon evaluation today patient with regard to her wound. She has been tolerating the dressing changes without complication. Fortunately there does not appear to be any signs of infection which is great news. No fever chills noted 11/15/2020 upon evaluation today patient appears to be doing well with regard to her wound. Fortunately there is no signs of active infection at this time. No fevers, chills, nausea, vomiting, or diarrhea. With that being said she continues to have a significant amount of pain at the site even though this is very close to complete closure. She also had several varicose veins around the area which were also problematic. Overall however I feel like the patient is making excellent progress. 11/28/2020 upon evaluation today patient appears to be doing well with regard to her leg ulcer. Again were not really able to debride or compression wrap her due to discomfort and pain. She does not allow for that. With that being said we have been using Iodosorb which does seem to be doing decently well. Fortunately there is no signs of active infection at this time which is great news. No fevers, chills, nausea, vomiting, or diarrhea. 8/31; patient presents for 2-week follow-up. She has been using Iodosorb. She reports that the wound is closed. She denies signs of infection. Readmission: 10-27-2021 upon evaluation this is a patient that presents today whom I have previously seen this is pretty much for the same issue though I think a little bit higher than the last time I saw her. She does have a history of chronic venous insufficiency and hypertension along with varicose veins. Subsequently she does have an ulceration which spontaneously ruptured she has not been wearing any compression which I think is a big part of the issue here. We discussed this before I really think she probably needs to be wearing her compression therapy, she probably needs lymphedema  pumps if she can ever wear the compression for a significant amount of time to get these, and subsequently also think that she needs to be elevating her legs is much as possible she may even need some vascular  intervention in regard to her veins. All of this was reiterated and discussed with her today to reinforce what needs to happen in order to ensure that her legs do not get a lot worse. The patient voiced understanding. She tells me that she knows because she is seeing her mom go through a lot of this as well how bad things can get. 11-03-2021 upon evaluation today patient appears to be doing well with regard to her wound. Fortunately there does not appear to be any signs of active infection at this time. She is measuring a little bit bigger but I think this is because the wound is actually cleaning up a bit here. 7/27; left lateral leg wound not any smaller but perhaps with a cleaner surface. She is using Iodoflex to help with the latter and using Tubigrip. She has chronic venous insufficiency with secondary lymphedema. She is apparently followed by vein and vascular and is being scheduled for an ablation 11-17-2021 upon evaluation today patient appears to be doing well with regard to her wound this is actually showing signs of improvement which is great news. Fortunately I do not see any evidence of active infection locally or systemically at this time which is great news. No fevers, chills, nausea, vomiting, or diarrhea. 11-24-2021 upon evaluation today patient's wound is actually showing signs of significant improvement. Unfortunately she had quite a bit of pain with debridement last week I do believe it was beneficial but at the same time she is doing much better but still really does not want this debrided again I think being that it is looking a whole lot better I would try to avoid that today especially since it caused her so much discomfort that is really not the goal and I explained that to  the patient today she voiced understanding and knows that it needed to be done but still states that it was quite painful. 11-30-2021 upon evaluation today patient appears to be doing excellent in regard to her wound this is actually showing signs of excellent improvement I am very pleased with where things stand. She does have her venous ablation appointment for October 17. 12-21-2021 upon evaluation today patient appears to be doing better in regard to her wound this is measuring smaller and looking better as well. Fortunately I do not see any signs of active infection locally or systemically which is great news. 01-01-2022 upon evaluation today patient appears to be doing well currently in regard to her wound. She is showing signs of improvement which is great news and overall I do not see any signs of active infection locally or systemically at this time. 01-08-2022 upon evaluation today patient appears to be doing well currently in regard to her wound she is actually showing signs of significant improvement which is great news. Fortunately I do not see any evidence of active infection locally or systemically at this time. I do believe that we are on the right track. She also has her appointment October 19 for the venous ablation. 01-16-2022 upon evaluation today patient's wound actually is showing some signs of improvement although this is very slow. Fortunately I do not see any evidence of infection at this time. The volume is a little bit more although the size is smaller I think this is due to the fact that we are slowly cleaning this area out effectively. 11/6 continued improvement the patient is using Iodoflex and a Tubigrip E. She was supposed to have venous surgery at vein and vascular  in [place] however somehow this is gotten delayed till December 14. 02-27-2022 upon evaluation today patient's wound is actually showing signs of being completely healed. Fortunately I do not see any evidence  of active infection locally or systemically which is great news and overall I am extremely pleased with where we stand today. Unfortunately she does tell me that she postpone her venous ablation surgery until December 14 she tells me that she was not ready "financially" for this. 04/18/2022; Ms. Vannah Nadal is a 37 year old female with a past medical history of venous insufficiency that presents the clinic for a left lower extremity wound. She was seen almost 2 months ago for the same wound. This had healed with Iodoflex and Tubigrip. She states that the wound recently reopened. She has seen vein and vascular and plan is for ligation and stripping of the left great saphenous vein. She is not sure when this procedure is going to be scheduled. She has canceled it once before. She has had office compression wraps in the past however these do not stay on and create more of an issue for her. She would like to avoid this. She currently denies signs of infection. TEREA, NEUBAUER ([id number]) [id number] Page 8 of 10 1/10; patient presents for follow-up. She has been using Iodoflex to the wound bed. She states she has been using her Tubigrip however she does not have this on today. She has no issues or complaints today. She denies signs of infection. 1/17; patient presents for follow-up. She has not been using Iodoflex to the wound bed. She reports acute pain. She denies increased warmth, erythema or purulent drainage to the left lower extremity. She states she has been using Tubigrip. She has information to order the compression stockings but has not obtained them. 1/24; Patient had a wound culture done at last clinic visit that grew Proteus mirabilis and E. coli. She was prescribed levofloxacin and this should cover both bacteria. She has been taking the medication over the past week with improvement of symptoms to the wound bed. She has been using Hydrofera Blue under Tubigrip.  She states the [facility] is sticking to her wound bed. 1/31; patient presents for follow-up. She has been using Medihoney to the wound bed with improvement in healing. She has been contacted by [facility] to order her antibiotic ointment. She is not sure yet if she is able to afford this. She received her compression stockings but states they did not fit well. She is working on returning these. She has been using Tubigrip. 2/7; patient presents for follow-up. She is been using Medihoney to the wound bed. She obtained her compression stockings and has been using these daily. She states that the [facility] antibiotic ointment is arriving in the mail tomorrow. She has no issues or complaints today. She denies systemic signs of infection. 2/14; patient presents for follow-up. She has been using Keystone antibiotic ointment to the wound bed. She states the [facility] is sticking and she has stopped this. She is been using her compression stockings daily. She does not have these on today. 2/28; patient presents for follow-up. She continues to use [facility] antibiotic ointment to the wound bed. There is been improvement in healing. She has been using her compression stockings daily. 3/12; patient presents for follow-up. She has been using Keystone antibiotic ointment to the wound bed. She is not wearing her compression stockings today but states she does wear them daily. She declines debridement today. 3/20; patient presents for follow-up.  She has been using Keystone antibiotic ointment with Hydrofera Blue. She states she is wearing her compression stockings daily but she does not have them on today. 3/27; patient presents for follow-up. She has been using Keystone antibiotic ointment and hydrofera blue to the wound bed. She reports wearing compression stockings. 4/10; patient presents for follow-up. She has been using Keystone antibiotic ointment and Hydrofera Blue to the wound bed. She reports  wearing compression stockings. She has no issues or complaints today. Objective Constitutional Vitals Time Taken: 3:47 PM, Height: 66 in, Weight: 400 lbs, BMI: 64.6, Temperature: 98.5 F, Pulse: 105 bpm, Respiratory Rate: 18 breaths/min, Blood Pressure: 100/79 mmHg. General Notes: Left lower extremity: T the distal medial aspect there is an open wound with granulation tissue and slough. No increased warmth, erythema or o purulent drainage. Stage III lymphedema Integumentary (Hair, Skin) Wound #5 status is Open. Original cause of wound was Gradually Appeared. The date acquired was: 04/10/2022. The wound has been in treatment 14 weeks. The wound is located on the Left,Medial Lower Leg. The wound measures 1.7cm length x 3.3cm width x 0.2cm depth; 4.406cm^2 area and 0.881cm^3 volume. There is Fat Layer (Subcutaneous Tissue) exposed. There is a medium amount of serosanguineous drainage noted. There is medium (34-66%) red granulation within the wound bed. There is a medium (34-66%) amount of necrotic tissue within the wound bed including Adherent Slough. Assessment Active Problems ICD-10 Chronic venous hypertension (idiopathic) with ulcer of left lower extremity Non-pressure chronic ulcer of other part of left lower leg with fat layer exposed Lymphedema, not elsewhere classified Patient's wound is stable. I debrided nonviable tissue. I recommended continuing with Keystone antibiotic ointment and Hydrofera Blue. She is difficult to do an in office wrap. I recommended she continue with her compression stockings daily. Unfortunately the venous ablation is on hold due to financial reasons. Follow-up in 1 week. Procedures Wound #5 Pre-procedure diagnosis of Wound #5 is a Venous Leg Ulcer located on the Left,Medial Lower Leg .Severity of Tissue Pre Debridement is: Fat layer exposed. NASIR, STOLTMAN ([id number]) [id number] Page 9 of 10 There was a Selective/Open Wound  Non-Viable Tissue Debridement with a total area of 1 sq cm performed by Geralyn Corwin, MD. With the following instrument(s): Curette to remove Viable and Non-Viable tissue/material. Material removed includes Slough. A time out was conducted at 16:04, prior to the start of the procedure. A Minimum amount of bleeding was controlled with Pressure. The procedure was tolerated well. Post Debridement Measurements: 1.7cm length x 3.3cm width x 0.2cm depth; 0.881cm^3 volume. Character of Wound/Ulcer Post Debridement is stable. Severity of Tissue Post Debridement is: Fat layer exposed. Post procedure Diagnosis Wound #5: Same as Pre-Procedure Plan Follow-up Appointments: Wound #5 Left,Medial Lower Leg: Return Appointment in 2 weeks. Bathing/ Shower/ Hygiene: Wash wounds with antibacterial soap and water. May shower with wound dressing protected with water repellent cover or cast protector. No tub bath. Edema Control - Lymphedema / Segmental Compressive Device / Other: Wound #5 Left,Medial Lower Leg: Patient to wear own compression stockings. Remove compression stockings every night before going to bed and put on every morning when getting up. - LLE, Elevate, Exercise Daily and Avoid Standing for Long Periods of Time. Elevate legs to the level of the heart and pump ankles as often as possible Elevate leg(s) parallel to the floor when sitting. Medications-Please add to medication list.: Wound #5 Left,Medial Lower Leg: Keystone Compound WOUND #5: - Lower Leg Wound Laterality: Left, Medial Cleanser: Soap and Water 1 x  Per Day/30 Days Discharge Instructions: Gently cleanse wound with antibacterial soap, rinse and pat dry prior to dressing wounds Cleanser: Vashe 5.8 (oz) 1 x Per Day/30 Days Discharge Instructions: Use vashe 5.8 (oz) as directed Peri-Wound Care: AandD Ointment 1 x Per Day/30 Days Discharge Instructions: Apply AandD Ointment as directed around edges to help dressing from  sticking Topical: keystone 1 x Per Day/30 Days Prim Dressing: Hydrofera Blue Ready Transfer Foam, 2.5x2.5 (in/in) 1 x Per Day/30 Days ary Discharge Instructions: Apply Hydrofera Blue Ready to wound bed as directed cut to fit wound bed Secondary Dressing: (BORDER) Zetuvit Plus SILICONE BORDER Dressing 4x4 (in/in) 1 x Per Day/30 Days Discharge Instructions: Please do not put silicone bordered dressings under wraps. Use non-bordered dressing only. 1. In office sharp debridement 2. Keystone antibiotic ointment with Hydrofera Blue 3. Compression stockings daily 4. Follow-up in 1 week Electronic Signature(s) Signed: 07/25/2022 4:29:14 PM By: Geralyn Corwin DO Entered By: Geralyn Corwin on 07/25/2022 16:12:00 -------------------------------------------------------------------------------- ROS/PFSH Details Patient Name: Date of Service: [name] Moody MA J. 07/25/2022 3:30 PM Medical Record Number: [id number] Patient Account Number: [id number] Date of Birth/Sex: Treating RN: 1986-04-12 (37 y.o. [name] Primary Care Provider: [name] Other Clinician: [name] Referring Provider: Treating Provider/Extender: Emeline Darling in Treatment: 14 Information Obtained From Patient Cardiovascular Medical History: Positive for: Hypertension; Peripheral Venous Disease Endocrine Medical History: Past Medical History Notes: pre DM, A1C 5.5 12/21 DENZIL, BRISTOL ([id number]) [id number].pdf Page 10 of 10 Immunizations Pneumococcal Vaccine: Received Pneumococcal Vaccination: No Implantable Devices None Hospitalization / Surgery History Type of Hospitalization/Surgery umb hernia repair right lower leg vein surgery Family and Social History Never smoker; Marital Status - Single; Alcohol Use: Rarely; Drug Use: Current History - marijuana; Caffeine Use: Rarely; Financial Concerns: No; Food, Clothing or Shelter Needs: No; Support  System Lacking: No; Transportation Concerns: No Electronic Signature(s) Signed: 07/25/2022 4:29:14 PM By: Geralyn Corwin DO Signed: 07/25/2022 5:50:22 PM By: Elliot Gurney, [name], RN, CWS, [name], [name] Entered By: Geralyn Corwin on 07/25/2022 16:12:43 -------------------------------------------------------------------------------- SuperBill Details Patient Name: Date of Service: [name] Moody MA J. 07/25/2022 Medical Record Number: [id number] Patient Account Number: [id number] Date of Birth/Sex: Treating RN: 1986/01/05 (37 y.o. Cathlean Cower, Kim Primary Care Provider: [name] Other Clinician: [name] Referring Provider: Treating Provider/Extender: Emeline Darling in Treatment: 14 Diagnosis Coding ICD-10 Codes Code Description [phone] Chronic venous hypertension (idiopathic) with ulcer of left lower extremity L97.822 Non-pressure chronic ulcer of other part of left lower leg with fat layer exposed I89.0 Lymphedema, not elsewhere classified Facility Procedures : CPT4 Code: [id number] Description: [phone] - DEBRIDE WOUND 1ST 20 SQ CM OR < ICD-10 Diagnosis Description I87.312 Chronic venous hypertension (idiopathic) with ulcer of left lower extremity L97.822 Non-pressure chronic ulcer of other part of left lower leg with fat layer expose Modifier: d Quantity: 1 Physician Procedures : CPT4 Code Description Modifier [id number] - WC PHYS DEBR WO ANESTH 20 SQ CM ICD-10 Diagnosis Description I87.312 Chronic venous hypertension (idiopathic) with ulcer of left lower extremity L97.822 Non-pressure chronic ulcer of other part of left  lower leg with fat layer exposed Quantity: 1 Electronic Signature(s) Signed: 07/25/2022 4:29:14 PM By: Geralyn Corwin DO Entered By: Geralyn Corwin on 07/25/2022 16:12:16

## 2022-08-01 ENCOUNTER — Ambulatory Visit: Payer: BC Managed Care – PPO | Admitting: Internal Medicine

## 2022-08-08 ENCOUNTER — Encounter (HOSPITAL_BASED_OUTPATIENT_CLINIC_OR_DEPARTMENT_OTHER): Payer: BC Managed Care – PPO | Admitting: Internal Medicine

## 2022-08-08 DIAGNOSIS — L97822 Non-pressure chronic ulcer of other part of left lower leg with fat layer exposed: Secondary | ICD-10-CM | POA: Diagnosis not present

## 2022-08-08 DIAGNOSIS — I87312 Chronic venous hypertension (idiopathic) with ulcer of left lower extremity: Secondary | ICD-10-CM

## 2022-08-08 DIAGNOSIS — I890 Lymphedema, not elsewhere classified: Secondary | ICD-10-CM | POA: Diagnosis not present

## 2022-08-09 NOTE — Progress Notes (Signed)
ORELLA, CUSHMAN ([id number]) [id number] Page 1 of 8 Visit Report for 08/08/2022 Arrival Information Details Patient Name: Date of Service: ALEXAS, BASULTO [place]. 08/08/2022 2:45 PM Medical Record Number: [id number] Patient Account Number: [id number] Date of Birth/Sex: Treating RN: September 25, 1985 (37 y.o. [name] Primary Care Brenan [place]: [name] Other Clinician: [name] Referring Kimiyah Blick: Treating Emersyn Wyss/Extender: Emeline Darling in Treatment: 16 Visit Information History Since Last Visit All ordered tests and consults were completed: No Patient Arrived: Ambulatory Added or deleted any medications: No Arrival Time: 14:58 Any new allergies or adverse reactions: No Transfer Assistance: None Had a fall or experienced change in No Patient Identification Verified: Yes activities of daily living that may affect Secondary Verification Process Completed: Yes risk of falls: Patient Requires Transmission-Based Precautions: No Signs or symptoms of abuse/neglect since last visito No Patient Has Alerts: No Hospitalized since last visit: No Implantable device outside of the clinic excluding No cellular tissue based products placed in the center since last visit: Has Dressing in Place as Prescribed: Yes Pain Present Now: Yes Electronic Signature(s) Signed: 08/08/2022 4:32:09 PM By: [name] Entered By: [name] on 08/08/2022 14:58:41 -------------------------------------------------------------------------------- Clinic Level of Care Assessment Details Patient Name: Date of Service: JORDYNE, POEHLMAN [place] 08/08/2022 2:45 PM Medical Record Number: [id number] Patient Account Number: [id number] Date of Birth/Sex: Treating RN: [birth year] (37 y.o. Cathlean Cower, Kim Primary Care Brevan Luberto: [name] Other Clinician: [name] Referring Zahra Peffley: Treating Arilla Hice/Extender: Emeline Darling in  Treatment: 16 Clinic Level of Care Assessment Items TOOL 4 Quantity Score  - 0 Use when only an EandM is performed on FOLLOW-UP visit ASSESSMENTS - Nursing Assessment / Reassessment X- 1 10 Reassessment of Co-morbidities (includes updates in patient status) X- 1 5 Reassessment of Adherence to Treatment Plan ASSESSMENTS - Wound and Skin A ssessment / Reassessment X - Simple Wound Assessment / Reassessment - one wound 1 5  - 0 Complex Wound Assessment / Reassessment - multiple wounds  - 0 Dermatologic / Skin Assessment (not related to wound area) ASSESSMENTS - Focused Assessment  - 0 Circumferential Edema Measurements - multi extremities  - 0 Nutritional Assessment / Counseling / Intervention  - 0 Lower Extremity Assessment (monofilament, tuning fork, pulses)  - 0 Peripheral Arterial Disease Assessment (using hand held doppler) ASSESSMENTS - Ostomy and/or Continence Assessment and Care  - 0 Incontinence Assessment and Management  - 0 Ostomy Care Assessment and Management (repouching, etc.) PROCESS - Coordination of Care JASLEEN, RIEPE ([id number]) [id number] Page 2 of 8 X- 1 15 Simple Patient / Family Education for ongoing care  - 0 Complex (extensive) Patient / Family Education for ongoing care  - 0 Staff obtains Chiropractor, Records, T Results / Process Orders est  - 0 Staff telephones HHA, Nursing Homes / Clarify orders / etc  - 0 Routine Transfer to another Facility (non-emergent condition)  - 0 Routine Hospital Admission (non-emergent condition)  - 0 New Admissions / Manufacturing engineer / Ordering NPWT Apligraf, etc. ,  - 0 Emergency Hospital Admission (emergent condition) X- 1 10 Simple Discharge Coordination  - 0 Complex (extensive) Discharge Coordination PROCESS - Special Needs  - 0 Pediatric / Minor Patient Management  - 0 Isolation Patient Management  - 0 Hearing / Language /  Visual special needs  - 0 Assessment of Community assistance (transportation, D/C planning, etc.)  - 0 Additional assistance / Altered mentation  - 0 Support Surface(s) Assessment (bed, cushion, seat, etc.) INTERVENTIONS - Wound Cleansing /  Measurement X - Simple Wound Cleansing - one wound 1 5  - 0 Complex Wound Cleansing - multiple wounds X- 1 5 Wound Imaging (photographs - any number of wounds)  - 0 Wound Tracing (instead of photographs) X- 1 5 Simple Wound Measurement - one wound  - 0 Complex Wound Measurement - multiple wounds INTERVENTIONS - Wound Dressings X - Small Wound Dressing one or multiple wounds 1 10  - 0 Medium Wound Dressing one or multiple wounds  - 0 Large Wound Dressing one or multiple wounds  - 0 Application of Medications - topical  - 0 Application of Medications - injection INTERVENTIONS - Miscellaneous  - 0 External ear exam  - 0 Specimen Collection (cultures, biopsies, blood, body fluids, etc.)  - 0 Specimen(s) / Culture(s) sent or taken to Lab for analysis  - 0 Patient Transfer (multiple staff / Nurse, adult / Similar devices)  - 0 Simple Staple / Suture removal (25 or less)  - 0 Complex Staple / Suture removal (26 or more)  - 0 Hypo / Hyperglycemic Management (close monitor of Blood Glucose)  - 0 Ankle / Brachial Index (ABI) - do not check if billed separately X- 1 5 Vital Signs Has the patient been seen at the hospital within the last three years: Yes Total Score: 75 Level Of Care: New/Established - Level 2 Electronic Signature(s) Signed: 08/08/2022 4:32:09 PM By: Luiz Ochoa, [name] ([id number]) [id number] Page 3 of 8 Entered By: [name] on 08/08/2022 15:17:00 -------------------------------------------------------------------------------- Encounter Discharge Information Details Patient Name: Date of Service: TRANIYAH, HALLETT [place] 08/08/2022 2:45 PM Medical  Record Number: [id number] Patient Account Number: [id number] Date of Birth/Sex: Treating RN: [birth year] (37 y.o. [name] Primary Care Keena Heesch: [name] Other Clinician: [name] Referring Jaiven Graveline: Treating Leanora Murin/Extender: Emeline Darling in Treatment: 16 Encounter Discharge Information Items Discharge Condition: Stable Ambulatory Status: Ambulatory Discharge Destination: Home Transportation: Private Auto Accompanied By: self Schedule Follow-up Appointment: Yes Clinical Summary of Care: Electronic Signature(s) Signed: 08/08/2022 4:32:09 PM By: [name] Entered By: [name] on 08/08/2022 15:28:36 -------------------------------------------------------------------------------- Lower Extremity Assessment Details Patient Name: Date of Service: TANDY, GRAWE MA J. 08/08/2022 2:45 PM Medical Record Number: [id number] Patient Account Number: [id number] Date of Birth/Sex: Treating RN: 1985/05/25 (37 y.o. Cathlean Cower, Kim Primary Care Nafisah Runions: [name] Other Clinician: [name] Referring Danel Studzinski: Treating Rebacca Votaw/Extender: Emeline Darling in Treatment: 16 Edema Assessment Assessed: [name] Yes] [name]: No] Edema: [Left: Ye] [Right: s] Calf Left: Right: Point of Measurement: 32 cm From Medial Instep 57 cm Ankle Left: Right: Point of Measurement: 12 cm From Medial Instep 33 cm Vascular Assessment Pulses: Dorsalis Pedis Palpable: [Left:Yes] Electronic Signature(s) Signed: 08/08/2022 4:32:09 PM By: [name] Signed: 08/08/2022 4:54:10 PM By: Elliot Gurney, [name], RN, CWS, [name], [name] Entered By: [name] on 08/08/2022 15:07:41 -------------------------------------------------------------------------------- Multi Wound Chart Details Patient Name: Date of Service: [name] 08/08/2022 2:45 PM Medical Record Number: [id number] Patient Account Number: [id number] Date of Birth/Sex:  Treating RN: 05-22-1985 (37 y.o. [name] Primary Care Alistair Senft: [name] Other Clinician: Lovely, Kerins ([id number]) [id number] Page 4 of 8 Referring Zalaya Astarita: Treating Magalie Almon/Extender: Emeline Darling in Treatment: 16 Vital Signs Height(in): 66 Pulse(bpm): 91 Weight(lbs): 400 Blood Pressure(mmHg): 113/76 Body Mass Index(BMI): 64.6 Temperature(F): 98.5 Respiratory Rate(breaths/min): 18 [5:Photos:] [N/A:N/A] Left, Medial Lower Leg N/A N/A Wound Location: Gradually Appeared N/A N/A Wounding Event: Venous Leg Ulcer N/A N/A Primary Etiology: Hypertension, Peripheral  Venous N/A N/A Comorbid History: Disease 04/10/2022 N/A N/A Date Acquired: 54 N/A N/A Weeks of Treatment: Open N/A N/A Wound Status: No N/A N/A Wound Recurrence: 1.9x3x0.2 N/A N/A Measurements L x W x D (cm) 4.477 N/A N/A A (cm) : rea 0.895 N/A N/A Volume (cm) : 18.60% N/A N/A % Reduction in Area: 18.60% N/A N/A % Reduction in Volume: Full Thickness Without Exposed N/A N/A Classification: Support Structures Medium N/A N/A Exudate Amount: Serosanguineous N/A N/A Exudate Type: red, brown N/A N/A Exudate Color: Medium (34-66%) N/A N/A Granulation Amount: Red N/A N/A Granulation Quality: Medium (34-66%) N/A N/A Necrotic Amount: Fat Layer (Subcutaneous Tissue): Yes N/A N/A Exposed Structures: Fascia: No Tendon: No Muscle: No Joint: No Bone: No Small (1-33%) N/A N/A Epithelialization: Treatment Notes Electronic Signature(s) Signed: 08/08/2022 4:32:09 PM By: [name] Entered By: [name] on 08/08/2022 15:08:02 -------------------------------------------------------------------------------- Multi-Disciplinary Care Plan Details Patient Name: Date of Service: [name] 08/08/2022 2:45 PM Medical Record Number: [id number] Patient Account Number: [id number] Date of Birth/Sex: Treating  RN: [birth year] (37 y.o. [name] Primary Care Biance Moncrief: [name] Other Clinician: [name] Referring Machele Deihl: Treating Litsy Epting/Extender: Emeline Darling in Treatment: 16 Active Inactive Wound/Skin Impairment Nursing Diagnoses: ZINA, PITZER ([id number]) [phone].pdf Page 5 of 8 Knowledge deficit related to ulceration/compromised skin integrity Goals: Patient/caregiver will verbalize understanding of skin care regimen Date Initiated: 04/18/2022 Target Resolution Date: 07/18/2022 Goal Status: Active Ulcer/skin breakdown will have a volume reduction of 30% by week 4 Date Initiated: 04/18/2022 Date Inactivated: 07/04/2022 Target Resolution Date: 05/19/2022 Goal Status: Unmet Unmet Reason: comorbidities Ulcer/skin breakdown will have a volume reduction of 50% by week 8 Date Initiated: 04/18/2022 Date Inactivated: 07/04/2022 Target Resolution Date: 06/18/2022 Goal Status: Unmet Unmet Reason: comorbidities Ulcer/skin breakdown will have a volume reduction of 80% by week 12 Date Initiated: 04/18/2022 Target Resolution Date: 07/18/2022 Goal Status: Active Ulcer/skin breakdown will heal within 14 weeks Date Initiated: 04/18/2022 Target Resolution Date: 08/17/2022 Goal Status: Active Interventions: Assess patient/caregiver ability to obtain necessary supplies Assess patient/caregiver ability to perform ulcer/skin care regimen upon admission and as needed Assess ulceration(s) every visit Notes: Electronic Signature(s) Signed: 08/08/2022 4:32:09 PM By: [name] Signed: 08/08/2022 4:54:10 PM By: Elliot Gurney, [name], RN, CWS, [name], [name] Entered By: [name] on 08/08/2022 15:17:11 -------------------------------------------------------------------------------- Pain Assessment Details Patient Name: Date of Service: [name] 08/08/2022 2:45 PM Medical Record Number: [id number] Patient Account Number: [id number] Date of  Birth/Sex: Treating RN: Aug 25, 1985 (37 y.o. [name] Primary Care Vaishnavi Dalby: [name] Other Clinician: [name] Referring Gerado Nabers: Treating Kayren Holck/Extender: Emeline Darling in Treatment: 16 Active Problems Location of Pain Severity and Description of Pain Patient Has Paino Yes Site Locations Duration of the Pain. Constant / Intermittento Constant Rate the pain. Current Pain Level: 7 Character of Pain Describe the Pain: Burning, Sharp, Stabbing Pain Management and Medication Current Pain Management: Medication: Yes Cold Application: No Rest: No Massage: No Activity: No T.E.N.S.: No Heat Application: No Leg drop or elevation: No EDRA, RICCARDI ([id number]) [phone].pdf Page 6 of 8 Is the Current Pain Management Adequate: Inadequate How does your wound impact your activities of daily livingo Sleep: No Bathing: No Appetite: No Relationship With Others: No Bladder Continence: No Emotions: No Bowel Continence: No Work: No Toileting: No Drive: No Dressing: No Hobbies: No Electronic Signature(s) Signed: 08/08/2022 4:32:09 PM By: [name] Signed: 08/08/2022 4:54:10 PM By: Elliot Gurney, [name], RN, CWS, [name], [name] Entered By: [name]  on 08/08/2022 15:05:38 -------------------------------------------------------------------------------- Patient/Caregiver Education Details Patient Name: Date of Service: WALTA, [place] 4/24/2024andnbsp2:45 PM Medical Record Number: [id number] Patient Account Number: [id number] Date of Birth/Gender: Treating RN: 1986-03-11 (37 y.o. [name] Primary Care Physician: [name] Other Clinician: [name] Referring Physician: Treating Physician/Extender: Emeline Darling in Treatment: 16 Education Assessment Education Provided To: Patient Education Topics Provided Wound/Skin Impairment: Handouts: Other: continue wound care  as directed Methods: Explain/Verbal Responses: State content correctly Electronic Signature(s) Signed: 08/08/2022 4:32:09 PM By: [name] Entered By: [name] on 08/08/2022 15:25:47 -------------------------------------------------------------------------------- Wound Assessment Details Patient Name: Date of Service: [name] 08/08/2022 2:45 PM Medical Record Number: [id number] Patient Account Number: [id number] Date of Birth/Sex: Treating RN: 11/28/1985 (37 y.o. [name] Primary Care Sanam Marmo: [name] Other Clinician: [name] Referring Tayllor Breitenstein: Treating Timberlynn Kizziah/Extender: Emeline Darling in Treatment: 16 Wound Status Wound Number: 5 Primary Etiology: Venous Leg Ulcer Wound Location: Left, Medial Lower Leg Wound Status: Open Wounding Event: Gradually Appeared Comorbid History: Hypertension, Peripheral Venous Disease Date Acquired: 04/10/2022 Weeks Of Treatment: 16 Clustered Wound: No Photos CORDIE, BEAZLEY ([id number]) [phone].pdf Page 7 of 8 Wound Measurements Length: (cm) 1.9 Width: (cm) 3 Depth: (cm) 0.2 Area: (cm) 4.477 Volume: (cm) 0.895 % Reduction in Area: 18.6% % Reduction in Volume: 18.6% Epithelialization: Small (1-33%) Wound Description Classification: Full Thickness Without Exposed Support Structures Exudate Amount: Medium Exudate Type: Serosanguineous Exudate Color: red, brown Foul Odor After Cleansing: No Slough/Fibrino Yes Wound Bed Granulation Amount: Medium (34-66%) Exposed Structure Granulation Quality: Red Fascia Exposed: No Necrotic Amount: Medium (34-66%) Fat Layer (Subcutaneous Tissue) Exposed: Yes Necrotic Quality: Adherent Slough Tendon Exposed: No Muscle Exposed: No Joint Exposed: No Bone Exposed: No Treatment Notes Wound #5 (Lower Leg) Wound Laterality: Left, Medial Cleanser Soap and Water Discharge Instruction: Gently cleanse wound with  antibacterial soap, rinse and pat dry prior to dressing wounds Vashe 5.8 (oz) Discharge Instruction: Use vashe 5.8 (oz) as directed Peri-Wound Care AandD Ointment Discharge Instruction: Apply AandD Ointment as directed around edges to help dressing from sticking Topical keystone Primary Dressing Hydrofera Blue Ready Transfer Foam, 2.5x2.5 (in/in) Discharge Instruction: Apply Hydrofera Blue Ready to wound bed as directed cut to fit wound bed Secondary Dressing (BORDER) Zetuvit Plus SILICONE BORDER Dressing 4x4 (in/in) Discharge Instruction: Please do not put silicone bordered dressings under wraps. Use non-bordered dressing only. Secured With Compression Wrap Compression Stockings Facilities manager) Signed: 08/08/2022 4:32:09 PM By: [name] Signed: 08/08/2022 4:54:10 PM By: Elliot Gurney, [name], RN, CWS, [name], [name] Entered By: [name] on 08/08/2022 15:06:36 Leis, [name] ([id number]) [id number] Page 8 of 8 -------------------------------------------------------------------------------- Vitals Details Patient Name: Date of Service: MISTY, FOUTZ [place] 08/08/2022 2:45 PM Medical Record Number: [id number] Patient Account Number: [id number] Date of Birth/Sex: Treating RN: 14-Aug-1985 (37 y.o. Cathlean Cower, Kim Primary Care Kalene Cutler: [name] Other Clinician: [name] Referring Matthieu Loftus: Treating Jacky Hartung/Extender: Emeline Darling in Treatment: 16 Vital Signs Time Taken: 14:59 Temperature (F): 98.5 Height (in): 66 Pulse (bpm): 91 Weight (lbs): 400 Respiratory Rate (breaths/min): 18 Body Mass Index (BMI): 64.6 Blood Pressure (mmHg): 113/76 Reference Range: 80 - 120 mg / dl Electronic Signature(s) Signed: 08/08/2022 4:32:09 PM By: [name] Entered By: [name] on 08/08/2022 15:01:19

## 2022-08-09 NOTE — Progress Notes (Signed)
THEA, HOLSHOUSER ([id number]) [id number] Page 1 of 10 Visit Report for 08/08/2022 Chief Complaint Document Details Patient Name: Date of Service: [name], [name] [place] 08/08/2022 2:45 PM Medical Record Number: [id number] Patient Account Number: [id number] Date of Birth/Sex: Treating RN: 05/11/1985 (37 y.o. [name] Primary Care Provider: [name] Other Clinician: [name] Referring Provider: Treating Provider/Extender: Emeline Darling in Treatment: 16 Information Obtained from: Patient Chief Complaint 04/18/2022; Left LE Ulcer Electronic Signature(s) Signed: 08/08/2022 3:43:42 PM By: Geralyn Corwin DO Entered By: Geralyn Corwin on 08/08/2022 15:27:18 -------------------------------------------------------------------------------- HPI Details Patient Name: Date of Service: [name] Moody MA J. 08/08/2022 2:45 PM Medical Record Number: [id number] Patient Account Number: [id number] Date of Birth/Sex: Treating RN: [birth year] (37 y.o. [name] Primary Care Provider: [name] Other Clinician: [name] Referring Provider: Treating Provider/Extender: Emeline Darling in Treatment: 16 History of Present Illness HPI Description: 37 year old patient who started with having ulcerations on the right lower leg on the lateral part of her ankle for about 2 weeks. She was seen in the ER at [facility] and advised to see the wound care for a consultation. No X-rays of workup was done during the ER visit and no prescription for any medications of compression wraps were given. the patient is not diabetic but does have hypertension and her medications have been reviewed by me. 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 The right and  left greater saphenous vein was found to be tortuous. Deep venous system was also not competent and there was reflux of more than 500 ms. She was then seen by Dr. Karie Schwalbe Early who recommended that the patient would not benefit from endovenous ablation and he had recommended vein stripping odd on the right side and multiple small phlebectomy procedures on the left side. the patient did not follow-up due to social economic reasons. She has not been wearing any compression stockings and has not taken any specific treatment for varicose veins for the last 3 years. 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 We have obtained some appointments to see the vascular surgeons by the end of the month and the patient would like to follow up with me at my [facility] on Wednesday, June 29. 10/14/2014 -- she could not see me yesterday in [place] and hence has come for a review today. She has a vascular workup to be done this afternoon at [facility]. She is doing fine otherwise. 10/22/2014 -- she was seen by Dr. Brantley Fling and he has recommended surgical removal of her right saphenous vein from distal thigh to saphenofemoral odd junction and stab phlebectomy's of multiple large tributary branches throughout her thigh and calf. This would be done under general anesthesia in the outpatient setting. 10/29/2014 -- she is trying to work on a surgical date and in the meanwhile we have got insurance clearance for Apligraf and we will start this next week. 7/22 2016 -- she is here for the first application of Apligraf. 11/19/2014 -- she is here for a second application of Apligraf 11/26/2014 -- she has done fine after her last application of Apligraf and is awaiting her surgery which is scheduled for August 31. 12/03/2014 -- she is doing  fine and is here for her third application of Apligraf. 12/21/2014 -- She had  surgery on 12/15/2014 by Dr.Early who did #1 ligation and stripping of right great saphenous vein from distal thigh to saphenofemoral junction, #2 stab phlebectomy of large tributary varicose veins in the thigh popliteal space and calf. She had an Ace wrap up to her groin and this was removed today and the Unna's boot was also removed. 12/28/2014 -- she is here for her fourth application of Apligraf. 01/06/2015 - he saw her vascular surgeon Dr. Arbie Cookey who was pleased with her progress and he has confirmed that no surgical procedures could be attempted on the left side. 01/13/2015 -- her wound looks very good and she's been having no problems whatsoever. Readmission: 07/26/2020 upon evaluation today patient presents for initial inspection here in our clinic for a new issue with her left leg although she is previously been seen due to issues with the right leg back in 2016. At that time she was seeing Dr. Arbie Cookey who is a vein/vascular specialist in [place]. He has since semiretired from what I understand. He is working out of [employer] I believe. Nonetheless she tells me at the time that there was really nothing to do for her left leg [name], [name] ([id number]) [id number] Page 2 of 10 although the right leg was where they did most of the work. Subsequently she states that she is done fairly well until just in the past week where she had issues with bleeding from what appears to be varicose vein on the left leg medially. Unfortunately this has continued to be an issue although she tells me at first it was coming much more significantly Down quite a bit but nonetheless has not completely resolved. Every time she showers she notices that it starts to drain a little bit more. She does have a history of chronic venous insufficiency, lymphedema, varicose veins bilaterally, and obesity. 08/02/2020 upon evaluation today patient appears to be doing about the same in regards to  the ulcer on her left leg. She has some eschar covering there is definitely some fluid collecting underneath unfortunately. With that being said she tells me she is still having a tremendous amount of pain therefore she is really not able to allow me to clean this off very effectively to be perfectly honest. I think we need to try to soften this up 08/16/2020 upon evaluation today patient's wound is really not doing significantly better not really states about the same. She notes that the wrap just does not seem to be staying up very well at all unfortunately. No fevers, chills, nausea, vomiting, or diarrhea. She did cut it off once it starts to slide in order to alleviate some of the pressure from sliding Down. Fortunately there is no signs of active infection at this time which is great news. 08/23/2020 upon evaluation today patient appears to be doing well 08/23/2020 upon evaluation today patient appears to be doing well with regard to her wound all things considered. Fortunately there does not appear to be any signs of active infection at this time which is great news. She has been tolerating the dressing changes without complication and overall I am extremely pleased with where things stand at this point. She does have her appointment with vascular in [facility] on June 9. 08/30/2020 upon evaluation today patient actually appears to be doing decently well in regard to her wound. Fortunately there is no signs of active infection which is great news. Nonetheless I  do believe that the patient is going require little bit of debridement if she is okay with me attempting that today I think that will help clean off some of the necrotic tissue. Fortunately there does not appear to be otherwise any evidence of active infection which is also great news. 09/19/2020 upon evaluation today patient appears to be doing a little better in regard to her wound as compared to previous. Fortunately there does not appear  to be any signs of active infection overall. No fever chills noted. I do believe that the Iodosorb has made this a little bit better with regard to the overall size and appearance of the wound bed though again she does still have quite a ways to go to get this to heal she still very tender to touch. 09/27/2020 upon evaluation today patient appears to actually be doing quite well with regard to her wound. This is measuring much smaller which is great news. With that being said she did see vein and vascular in [facility] and they subsequently recommended that surgery is really what she probably needs to go forward with sounds like the potential for venous ablation. With that being said the patient tells me this is just not the right time for her to be able to proceed with any type of surgery which I completely understand. Nonetheless I do believe that she would continue to benefit from compression but again that is really not something that she is able to easily do. 10/04/2020 upon evaluation today patient appears to be doing about the same in regard to her wound. This is measuring a little bit smaller but nonetheless still is open and again has some slough and biofilm noted on the surface of the wound. There does not appear to be any signs of active infection which is great news. No fevers, chills, nausea, vomiting, or diarrhea. 10/04/2020 upon evaluation today patient appears to be doing well with regard to her wound. She has been tolerating dressing changes without complication. Fortunately there does not appear to be any signs of active infection which is great news. No fevers, chills, nausea, vomiting, or diarrhea. 10/25/2020 upon evaluation today patient appears to be doing well with regard to her wound. She has been tolerating the dressing changes without complication. Fortunately there is no signs of active infection at this time. No fevers, chills, nausea, vomiting, or diarrhea. 11/01/2020 upon  evaluation today patient with regard to her wound. She has been tolerating the dressing changes without complication. Fortunately there does not appear to be any signs of infection which is great news. No fever chills noted 11/15/2020 upon evaluation today patient appears to be doing well with regard to her wound. Fortunately there is no signs of active infection at this time. No fevers, chills, nausea, vomiting, or diarrhea. With that being said she continues to have a significant amount of pain at the site even though this is very close to complete closure. She also had several varicose veins around the area which were also problematic. Overall however I feel like the patient is making excellent progress. 11/28/2020 upon evaluation today patient appears to be doing well with regard to her leg ulcer. Again were not really able to debride or compression wrap her due to discomfort and pain. She does not allow for that. With that being said we have been using Iodosorb which does seem to be doing decently well. Fortunately there is no signs of active infection at this time which is great news. No  fevers, chills, nausea, vomiting, or diarrhea. 8/31; patient presents for 2-week follow-up. She has been using Iodosorb. She reports that the wound is closed. She denies signs of infection. Readmission: 10-27-2021 upon evaluation this is a patient that presents today whom I have previously seen this is pretty much for the same issue though I think a little bit higher than the last time I saw her. She does have a history of chronic venous insufficiency and hypertension along with varicose veins. Subsequently she does have an ulceration which spontaneously ruptured she has not been wearing any compression which I think is a big part of the issue here. We discussed this before I really think she probably needs to be wearing her compression therapy, she probably needs lymphedema pumps if she can ever wear  the compression for a significant amount of time to get these, and subsequently also think that she needs to be elevating her legs is much as possible she may even need some vascular intervention in regard to her veins. All of this was reiterated and discussed with her today to reinforce what needs to happen in order to ensure that her legs do not get a lot worse. The patient voiced understanding. She tells me that she knows because she is seeing her mom go through a lot of this as well how bad things can get. 11-03-2021 upon evaluation today patient appears to be doing well with regard to her wound. Fortunately there does not appear to be any signs of active infection at this time. She is measuring a little bit bigger but I think this is because the wound is actually cleaning up a bit here. 7/27; left lateral leg wound not any smaller but perhaps with a cleaner surface. She is using Iodoflex to help with the latter and using Tubigrip. She has chronic venous insufficiency with secondary lymphedema. She is apparently followed by vein and vascular and is being scheduled for an ablation 11-17-2021 upon evaluation today patient appears to be doing well with regard to her wound this is actually showing signs of improvement which is great news. Fortunately I do not see any evidence of active infection locally or systemically at this time which is great news. No fevers, chills, nausea, vomiting, or diarrhea. 11-24-2021 upon evaluation today patient's wound is actually showing signs of significant improvement. Unfortunately she had quite a bit of pain with debridement last week I do believe it was beneficial but at the same time she is doing much better but still really does not want this debrided again I think being that it is looking a whole lot better I would try to avoid that today especially since it caused her so much discomfort that is really not the goal and I explained that to the patient today she  voiced understanding and knows that it needed to be done but still states that it was quite painful. 11-30-2021 upon evaluation today patient appears to be doing excellent in regard to her wound this is actually showing signs of excellent improvement I am very pleased with where things stand. She does have her venous ablation appointment for October 17. 12-21-2021 upon evaluation today patient appears to be doing better in regard to her wound this is measuring smaller and looking better as well. Fortunately I do not see any signs of active infection locally or systemically which is great news. 01-01-2022 upon evaluation today patient appears to be doing well currently in regard to her wound. She is showing signs of  improvement which is great news and overall I do not see any signs of active infection locally or systemically at this time. 01-08-2022 upon evaluation today patient appears to be doing well currently in regard to her wound she is actually showing signs of significant improvement OAKLYN, JAKUBEK ([id number]) [phone].pdf Page 3 of 10 which is great news. Fortunately I do not see any evidence of active infection locally or systemically at this time. I do believe that we are on the right track. She also has her appointment October 19 for the venous ablation. 01-16-2022 upon evaluation today patient's wound actually is showing some signs of improvement although this is very slow. Fortunately I do not see any evidence of infection at this time. The volume is a little bit more although the size is smaller I think this is due to the fact that we are slowly cleaning this area out effectively. 11/6 continued improvement the patient is using Iodoflex and a Tubigrip E. She was supposed to have venous surgery at vein and vascular in [place] however somehow this is gotten delayed till December 14. 02-27-2022 upon evaluation today patient's wound is actually showing signs of  being completely healed. Fortunately I do not see any evidence of active infection locally or systemically which is great news and overall I am extremely pleased with where we stand today. Unfortunately she does tell me that she postpone her venous ablation surgery until December 14 she tells me that she was not ready "financially" for this. 04/18/2022; Ms. Bryleigh Ottaway is a 37 year old female with a past medical history of venous insufficiency that presents the clinic for a left lower extremity wound. She was seen almost 2 months ago for the same wound. This had healed with Iodoflex and Tubigrip. She states that the wound recently reopened. She has seen vein and vascular and plan is for ligation and stripping of the left great saphenous vein. She is not sure when this procedure is going to be scheduled. She has canceled it once before. She has had office compression wraps in the past however these do not stay on and create more of an issue for her. She would like to avoid this. She currently denies signs of infection. 1/10; patient presents for follow-up. She has been using Iodoflex to the wound bed. She states she has been using her Tubigrip however she does not have this on today. She has no issues or complaints today. She denies signs of infection. 1/17; patient presents for follow-up. She has not been using Iodoflex to the wound bed. She reports acute pain. She denies increased warmth, erythema or purulent drainage to the left lower extremity. She states she has been using Tubigrip. She has information to order the compression stockings but has not obtained them. 1/24; Patient had a wound culture done at last clinic visit that grew Proteus mirabilis and E. coli. She was prescribed levofloxacin and this should cover both bacteria. She has been taking the medication over the past week with improvement of symptoms to the wound bed. She has been using Hydrofera Blue under Tubigrip. She states the  [facility] is sticking to her wound bed. 1/31; patient presents for follow-up. She has been using Medihoney to the wound bed with improvement in healing. She has been contacted by [facility] to order her antibiotic ointment. She is not sure yet if she is able to afford this. She received her compression stockings but states they did not fit well. She is working on returning these.  She has been using Tubigrip. 2/7; patient presents for follow-up. She is been using Medihoney to the wound bed. She obtained her compression stockings and has been using these daily. She states that the [facility] antibiotic ointment is arriving in the mail tomorrow. She has no issues or complaints today. She denies systemic signs of infection. 2/14; patient presents for follow-up. She has been using Keystone antibiotic ointment to the wound bed. She states the [facility] is sticking and she has stopped this. She is been using her compression stockings daily. She does not have these on today. 2/28; patient presents for follow-up. She continues to use [facility] antibiotic ointment to the wound bed. There is been improvement in healing. She has been using her compression stockings daily. 3/12; patient presents for follow-up. She has been using Keystone antibiotic ointment to the wound bed. She is not wearing her compression stockings today but states she does wear them daily. She declines debridement today. 3/20; patient presents for follow-up. She has been using Keystone antibiotic ointment with Hydrofera Blue. She states she is wearing her compression stockings daily but she does not have them on today. 3/27; patient presents for follow-up. She has been using Keystone antibiotic ointment and hydrofera blue to the wound bed. She reports wearing compression stockings. 4/10; patient presents for follow-up. She has been using Keystone antibiotic ointment and Hydrofera Blue to the wound bed. She reports wearing  compression stockings. She has no issues or complaints today. 4/24; patient presents for follow-up. She has been using Keystone antibiotic ointment and Hydrofera Blue to the wound bed. Overall there is healthier tissue today. She reports wearing her compression stockings daily. Electronic Signature(s) Signed: 08/08/2022 3:43:42 PM By: Geralyn Corwin DO Entered By: Geralyn Corwin on 08/08/2022 15:28:41 -------------------------------------------------------------------------------- Physical Exam Details Patient Name: Date of Service: [name] Moody MA J. 08/08/2022 2:45 PM Medical Record Number: [id number] Patient Account Number: [id number] Date of Birth/Sex: Treating RN: Dec 20, 1985 (37 y.o. [name] Primary Care Provider: [name] Other Clinician: [name] Referring Provider: Treating Provider/Extender: Emeline Darling in Treatment: 16 Constitutional . Cardiovascular . Psychiatric . DONNAMARIA, SHANDS ([id number]) [id number] Page 4 of 10 Notes Left lower extremity: T the distal medial aspect there is an open wound with granulation tissue and scant nonviable tissue. No signs of surrounding infection. o Stage III lymphedema. Electronic Signature(s) Signed: 08/08/2022 3:43:42 PM By: Geralyn Corwin DO Entered By: Geralyn Corwin on 08/08/2022 15:29:26 -------------------------------------------------------------------------------- Physician Orders Details Patient Name: Date of Service: [name] Moody MA J. 08/08/2022 2:45 PM Medical Record Number: [id number] Patient Account Number: [id number] Date of Birth/Sex: Treating RN: Jun 24, 1985 (37 y.o. [name] Primary Care Provider: [name] Other Clinician: [name] Referring Provider: Treating Provider/Extender: Emeline Darling in Treatment: 16 Verbal / Phone Orders: Yes Clinician: [name] Read Back and Verified:  Yes Diagnosis Coding Follow-up Appointments Wound #5 Left,Medial Lower Leg Return Appointment in 2 weeks. Bathing/ [employer] wounds with antibacterial soap and water. May shower with wound dressing protected with water repellent cover or cast protector. No tub bath. Edema Control - Lymphedema / Segmental Compressive Device / Other Wound #5 Left,Medial Lower Leg Patient to wear own compression stockings. Remove compression stockings every night before going to bed and put on every morning when getting up. - LLE, Elevate, Exercise Daily and A void Standing for Long Periods of Time. Elevate legs to the level of the heart and pump ankles as often as possible Elevate leg(s) parallel to  the floor when sitting. Medications-Please add to medication list. Wound #5 Left,Medial Lower Leg Keystone Compound Wound Treatment Wound #5 - Lower Leg Wound Laterality: Left, Medial Cleanser: Soap and Water 1 x Per Day/30 Days Discharge Instructions: Gently cleanse wound with antibacterial soap, rinse and pat dry prior to dressing wounds Cleanser: Vashe 5.8 (oz) 1 x Per Day/30 Days Discharge Instructions: Use vashe 5.8 (oz) as directed Peri-Wound Care: AandD Ointment 1 x Per Day/30 Days Discharge Instructions: Apply AandD Ointment as directed around edges to help dressing from sticking Topical: keystone 1 x Per Day/30 Days Prim Dressing: Hydrofera Blue Ready Transfer Foam, 2.5x2.5 (in/in) 1 x Per Day/30 Days ary Discharge Instructions: Apply Hydrofera Blue Ready to wound bed as directed cut to fit wound bed Secondary Dressing: (BORDER) Zetuvit Plus SILICONE BORDER Dressing 4x4 (in/in) 1 x Per Day/30 Days Discharge Instructions: Please do not put silicone bordered dressings under wraps. Use non-bordered dressing only. Electronic Signature(s) Signed: 08/08/2022 3:43:42 PM By: Geralyn Corwin DO Entered By: Geralyn Corwin on 08/08/2022 15:31:38 Problem List  Details -------------------------------------------------------------------------------- [name] ([id number]) [id number] Page 5 of 10 Patient Name: Date of Service: BRIDGET, [place] 08/08/2022 2:45 PM Medical Record Number: [id number] Patient Account Number: [id number] Date of Birth/Sex: Treating RN: [birth year] (37 y.o. Cathlean Cower, Kim Primary Care Provider: [name] Other Clinician: [name] Referring Provider: Treating Provider/Extender: Emeline Darling in Treatment: 16 Active Problems ICD-10 Encounter Code Description Active Date MDM Diagnosis I87.312 Chronic venous hypertension (idiopathic) with ulcer of left lower extremity 04/18/2022 No Yes L97.822 Non-pressure chronic ulcer of other part of left lower leg with fat layer exposed1/06/2022 No Yes I89.0 Lymphedema, not elsewhere classified 04/18/2022 No Yes Inactive Problems Resolved Problems Electronic Signature(s) Signed: 08/08/2022 3:43:42 PM By: Geralyn Corwin DO Entered By: Geralyn Corwin on 08/08/2022 15:26:52 -------------------------------------------------------------------------------- Progress Note Details Patient Name: Date of Service: [name] Moody MA J. 08/08/2022 2:45 PM Medical Record Number: [id number] Patient Account Number: [id number] Date of Birth/Sex: Treating RN: [birth year] (37 y.o. [name] Primary Care Provider: [name] Other Clinician: [name] Referring Provider: Treating Provider/Extender: Emeline Darling in Treatment: 16 Subjective Chief Complaint Information obtained from Patient 04/18/2022; Left LE Ulcer History of Present Illness (HPI) 37 year old patient who started with having ulcerations on the right lower leg on the lateral part of her ankle for about 2 weeks. She was seen in the ER at [facility] and advised to see the wound care for a consultation. No X-rays of workup was done  during the ER visit and no prescription for any medications of compression wraps were given. the patient is not diabetic but does have hypertension and her medications have been reviewed by me. 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 The right and left greater saphenous vein was found to be tortuous. Deep venous system was also not competent and there was reflux of more than 500 ms. She was then seen by Dr. Karie Schwalbe Early who recommended that the patient would not benefit from endovenous ablation and he had recommended vein stripping odd on the right side and multiple small phlebectomy procedures on the left side. the patient did not follow-up due to social economic reasons. She has not been wearing any compression stockings and has not taken any specific treatment for varicose veins for the  last 3 years. 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 We have obtained some appointments to see the vascular surgeons by the end of the month and the patient would like to follow up with me at my [facility] on Wednesday, June 29. 10/14/2014 -- she could not see me yesterday in [place] and hence has come for a review today. She has a vascular workup to be done this afternoon at [facility]. She is doing fine otherwise. 10/22/2014 -- she was seen by Dr. Brantley Fling and he has recommended surgical removal of her right saphenous vein from distal thigh to saphenofemoral odd junction and stab phlebectomy's of multiple large tributary branches throughout her thigh and calf. This would be done under general anesthesia in the outpatient setting. 10/29/2014 -- she is trying to work on a surgical date and in the meanwhile we have got insurance  clearance for Apligraf and we will start this next week. 7/22 2016 -- she is here for the first application of Apligraf. 11/19/2014 -- she is here for a second application of Apligraf 11/26/2014 -- she has done fine after her last application of Apligraf and is awaiting her surgery which is scheduled for August 31. ORRIE, LASCANO ([id number]) [id number] Page 6 of 10 12/03/2014 -- she is doing fine and is here for her third application of Apligraf. 12/21/2014 -- She had surgery on 12/15/2014 by Dr.Early who did #1 ligation and stripping of right great saphenous vein from distal thigh to saphenofemoral junction, #2 stab phlebectomy of large tributary varicose veins in the thigh popliteal space and calf. She had an Ace wrap up to her groin and this was removed today and the Unna's boot was also removed. 12/28/2014 -- she is here for her fourth application of Apligraf. 01/06/2015 - he saw her vascular surgeon Dr. Arbie Cookey who was pleased with her progress and he has confirmed that no surgical procedures could be attempted on the left side. 01/13/2015 -- her wound looks very good and she's been having no problems whatsoever. Readmission: 07/26/2020 upon evaluation today patient presents for initial inspection here in our clinic for a new issue with her left leg although she is previously been seen due to issues with the right leg back in 2016. At that time she was seeing Dr. Arbie Cookey who is a vein/vascular specialist in [place]. He has since semiretired from what I understand. He is working out of [employer] I believe. Nonetheless she tells me at the time that there was really nothing to do for her left leg although the right leg was where they did most of the work. Subsequently she states that she is done fairly well until just in the past week where she had issues with bleeding from what appears to be varicose vein on the left leg medially. Unfortunately this has continued to  be an issue although she tells me at first it was coming much more significantly Down quite a bit but nonetheless has not completely resolved. Every time she showers she notices that it starts to drain a little bit more. She does have a history of chronic venous insufficiency, lymphedema, varicose veins bilaterally, and obesity. 08/02/2020 upon evaluation today patient appears to be doing about the same in regards to the ulcer on her left leg. She has some eschar covering there is definitely some fluid collecting underneath unfortunately. With that being said she tells me she  is still having a tremendous amount of pain therefore she is really not able to allow me to clean this off very effectively to be perfectly honest. I think we need to try to soften this up 08/16/2020 upon evaluation today patient's wound is really not doing significantly better not really states about the same. She notes that the wrap just does not seem to be staying up very well at all unfortunately. No fevers, chills, nausea, vomiting, or diarrhea. She did cut it off once it starts to slide in order to alleviate some of the pressure from sliding Down. Fortunately there is no signs of active infection at this time which is great news. 08/23/2020 upon evaluation today patient appears to be doing well 08/23/2020 upon evaluation today patient appears to be doing well with regard to her wound all things considered. Fortunately there does not appear to be any signs of active infection at this time which is great news. She has been tolerating the dressing changes without complication and overall I am extremely pleased with where things stand at this point. She does have her appointment with vascular in [facility] on June 9. 08/30/2020 upon evaluation today patient actually appears to be doing decently well in regard to her wound. Fortunately there is no signs of active infection which is great news. Nonetheless I do believe that the patient  is going require little bit of debridement if she is okay with me attempting that today I think that will help clean off some of the necrotic tissue. Fortunately there does not appear to be otherwise any evidence of active infection which is also great news. 09/19/2020 upon evaluation today patient appears to be doing a little better in regard to her wound as compared to previous. Fortunately there does not appear to be any signs of active infection overall. No fever chills noted. I do believe that the Iodosorb has made this a little bit better with regard to the overall size and appearance of the wound bed though again she does still have quite a ways to go to get this to heal she still very tender to touch. 09/27/2020 upon evaluation today patient appears to actually be doing quite well with regard to her wound. This is measuring much smaller which is great news. With that being said she did see vein and vascular in [facility] and they subsequently recommended that surgery is really what she probably needs to go forward with sounds like the potential for venous ablation. With that being said the patient tells me this is just not the right time for her to be able to proceed with any type of surgery which I completely understand. Nonetheless I do believe that she would continue to benefit from compression but again that is really not something that she is able to easily do. 10/04/2020 upon evaluation today patient appears to be doing about the same in regard to her wound. This is measuring a little bit smaller but nonetheless still is open and again has some slough and biofilm noted on the surface of the wound. There does not appear to be any signs of active infection which is great news. No fevers, chills, nausea, vomiting, or diarrhea. 10/04/2020 upon evaluation today patient appears to be doing well with regard to her wound. She has been tolerating dressing changes without complication. Fortunately there  does not appear to be any signs of active infection which is great news. No fevers, chills, nausea, vomiting, or diarrhea. 10/25/2020 upon evaluation today patient  appears to be doing well with regard to her wound. She has been tolerating the dressing changes without complication. Fortunately there is no signs of active infection at this time. No fevers, chills, nausea, vomiting, or diarrhea. 11/01/2020 upon evaluation today patient with regard to her wound. She has been tolerating the dressing changes without complication. Fortunately there does not appear to be any signs of infection which is great news. No fever chills noted 11/15/2020 upon evaluation today patient appears to be doing well with regard to her wound. Fortunately there is no signs of active infection at this time. No fevers, chills, nausea, vomiting, or diarrhea. With that being said she continues to have a significant amount of pain at the site even though this is very close to complete closure. She also had several varicose veins around the area which were also problematic. Overall however I feel like the patient is making excellent progress. 11/28/2020 upon evaluation today patient appears to be doing well with regard to her leg ulcer. Again were not really able to debride or compression wrap her due to discomfort and pain. She does not allow for that. With that being said we have been using Iodosorb which does seem to be doing decently well. Fortunately there is no signs of active infection at this time which is great news. No fevers, chills, nausea, vomiting, or diarrhea. 8/31; patient presents for 2-week follow-up. She has been using Iodosorb. She reports that the wound is closed. She denies signs of infection. Readmission: 10-27-2021 upon evaluation this is a patient that presents today whom I have previously seen this is pretty much for the same issue though I think a little bit higher than the last time I saw her. She does have a  history of chronic venous insufficiency and hypertension along with varicose veins. Subsequently she does have an ulceration which spontaneously ruptured she has not been wearing any compression which I think is a big part of the issue here. We discussed this before I really think she probably needs to be wearing her compression therapy, she probably needs lymphedema pumps if she can ever wear the compression for a significant amount of time to get these, and subsequently also think that she needs to be elevating her legs is much as possible she may even need some vascular intervention in regard to her veins. All of this was reiterated and discussed with her today to reinforce what needs to happen in order to ensure that her legs do not get a lot worse. The patient voiced understanding. She tells me that she knows because she is seeing her mom go through a lot of this as well how bad things can get. 11-03-2021 upon evaluation today patient appears to be doing well with regard to her wound. Fortunately there does not appear to be any signs of active infection at this time. She is measuring a little bit bigger but I think this is because the wound is actually cleaning up a bit here. 7/27; left lateral leg wound not any smaller but perhaps with a cleaner surface. She is using Iodoflex to help with the latter and using Tubigrip. She has chronic venous insufficiency with secondary lymphedema. She is apparently followed by vein and vascular and is being scheduled for an ablation 11-17-2021 upon evaluation today patient appears to be doing well with regard to her wound this is actually showing signs of improvement which is great news. Fortunately I do not see any evidence of active infection locally or  systemically at this time which is great news. No fevers, chills, nausea, vomiting, or diarrhea. [name], [name] ([id number]) [id number] Page 7 of 10 11-24-2021 upon evaluation today  patient's wound is actually showing signs of significant improvement. Unfortunately she had quite a bit of pain with debridement last week I do believe it was beneficial but at the same time she is doing much better but still really does not want this debrided again I think being that it is looking a whole lot better I would try to avoid that today especially since it caused her so much discomfort that is really not the goal and I explained that to the patient today she voiced understanding and knows that it needed to be done but still states that it was quite painful. 11-30-2021 upon evaluation today patient appears to be doing excellent in regard to her wound this is actually showing signs of excellent improvement I am very pleased with where things stand. She does have her venous ablation appointment for October 17. 12-21-2021 upon evaluation today patient appears to be doing better in regard to her wound this is measuring smaller and looking better as well. Fortunately I do not see any signs of active infection locally or systemically which is great news. 01-01-2022 upon evaluation today patient appears to be doing well currently in regard to her wound. She is showing signs of improvement which is great news and overall I do not see any signs of active infection locally or systemically at this time. 01-08-2022 upon evaluation today patient appears to be doing well currently in regard to her wound she is actually showing signs of significant improvement which is great news. Fortunately I do not see any evidence of active infection locally or systemically at this time. I do believe that we are on the right track. She also has her appointment October 19 for the venous ablation. 01-16-2022 upon evaluation today patient's wound actually is showing some signs of improvement although this is very slow. Fortunately I do not see any evidence of infection at this time. The volume is a little bit more although the  size is smaller I think this is due to the fact that we are slowly cleaning this area out effectively. 11/6 continued improvement the patient is using Iodoflex and a Tubigrip E. She was supposed to have venous surgery at vein and vascular in [place] however somehow this is gotten delayed till December 14. 02-27-2022 upon evaluation today patient's wound is actually showing signs of being completely healed. Fortunately I do not see any evidence of active infection locally or systemically which is great news and overall I am extremely pleased with where we stand today. Unfortunately she does tell me that she postpone her venous ablation surgery until December 14 she tells me that she was not ready "financially" for this. 04/18/2022; [name] is a 37 year old female with a past medical history of venous insufficiency that presents the clinic for a left lower extremity wound. She was seen almost 2 months ago for the same wound. This had healed with Iodoflex and Tubigrip. She states that the wound recently reopened. She has seen vein and vascular and plan is for ligation and stripping of the left great saphenous vein. She is not sure when this procedure is going to be scheduled. She has canceled it once before. She has had office compression wraps in the past however these do not stay on and create more of an issue for her. She would like  to avoid this. She currently denies signs of infection. 1/10; patient presents for follow-up. She has been using Iodoflex to the wound bed. She states she has been using her Tubigrip however she does not have this on today. She has no issues or complaints today. She denies signs of infection. 1/17; patient presents for follow-up. She has not been using Iodoflex to the wound bed. She reports acute pain. She denies increased warmth, erythema or purulent drainage to the left lower extremity. She states she has been using Tubigrip. She has information to order the  compression stockings but has not obtained them. 1/24; Patient had a wound culture done at last clinic visit that grew Proteus mirabilis and E. coli. She was prescribed levofloxacin and this should cover both bacteria. She has been taking the medication over the past week with improvement of symptoms to the wound bed. She has been using Hydrofera Blue under Tubigrip. She states the [facility] is sticking to her wound bed. 1/31; patient presents for follow-up. She has been using Medihoney to the wound bed with improvement in healing. She has been contacted by [facility] to order her antibiotic ointment. She is not sure yet if she is able to afford this. She received her compression stockings but states they did not fit well. She is working on returning these. She has been using Tubigrip. 2/7; patient presents for follow-up. She is been using Medihoney to the wound bed. She obtained her compression stockings and has been using these daily. She states that the [facility] antibiotic ointment is arriving in the mail tomorrow. She has no issues or complaints today. She denies systemic signs of infection. 2/14; patient presents for follow-up. She has been using Keystone antibiotic ointment to the wound bed. She states the [facility] is sticking and she has stopped this. She is been using her compression stockings daily. She does not have these on today. 2/28; patient presents for follow-up. She continues to use [facility] antibiotic ointment to the wound bed. There is been improvement in healing. She has been using her compression stockings daily. 3/12; patient presents for follow-up. She has been using Keystone antibiotic ointment to the wound bed. She is not wearing her compression stockings today but states she does wear them daily. She declines debridement today. 3/20; patient presents for follow-up. She has been using Keystone antibiotic ointment with Hydrofera Blue. She states she is wearing her  compression stockings daily but she does not have them on today. 3/27; patient presents for follow-up. She has been using Keystone antibiotic ointment and hydrofera blue to the wound bed. She reports wearing compression stockings. 4/10; patient presents for follow-up. She has been using Keystone antibiotic ointment and Hydrofera Blue to the wound bed. She reports wearing compression stockings. She has no issues or complaints today. 4/24; patient presents for follow-up. She has been using Keystone antibiotic ointment and Hydrofera Blue to the wound bed. Overall there is healthier tissue today. She reports wearing her compression stockings daily. Objective Constitutional Vitals Time Taken: 2:59 PM, Height: 66 in, Weight: 400 lbs, BMI: 64.6, Temperature: 98.5 F, Pulse: 91 bpm, Respiratory Rate: 18 breaths/min, Blood Pressure: 113/76 mmHg. KYNESHA, GUERIN ([id number]) [id number] Page 8 of 10 General Notes: Left lower extremity: T the distal medial aspect there is an open wound with granulation tissue and scant nonviable tissue. No signs of o surrounding infection. Stage III lymphedema. Integumentary (Hair, Skin) Wound #5 status is Open. Original cause of wound was Gradually Appeared. The date acquired was:  04/10/2022. The wound has been in treatment 16 weeks. The wound is located on the Left,Medial Lower Leg. The wound measures 1.9cm length x 3cm width x 0.2cm depth; 4.477cm^2 area and 0.895cm^3 volume. There is Fat Layer (Subcutaneous Tissue) exposed. There is a medium amount of serosanguineous drainage noted. There is medium (34-66%) red granulation within the wound bed. There is a medium (34-66%) amount of necrotic tissue within the wound bed including Adherent Slough. Assessment Active Problems ICD-10 Chronic venous hypertension (idiopathic) with ulcer of left lower extremity Non-pressure chronic ulcer of other part of left lower leg with fat layer  exposed Lymphedema, not elsewhere classified Patient's wound appears well-healing. I recommended continuing the course with Keystone antibiotic ointment and Hydrofera Blue. Continue compression stockings daily. Follow-up in 1 week. Plan Follow-up Appointments: Wound #5 Left,Medial Lower Leg: Return Appointment in 2 weeks. Bathing/ Shower/ Hygiene: Wash wounds with antibacterial soap and water. May shower with wound dressing protected with water repellent cover or cast protector. No tub bath. Edema Control - Lymphedema / Segmental Compressive Device / Other: Wound #5 Left,Medial Lower Leg: Patient to wear own compression stockings. Remove compression stockings every night before going to bed and put on every morning when getting up. - LLE, Elevate, Exercise Daily and Avoid Standing for Long Periods of Time. Elevate legs to the level of the heart and pump ankles as often as possible Elevate leg(s) parallel to the floor when sitting. Medications-Please add to medication list.: Wound #5 Left,Medial Lower Leg: Keystone Compound WOUND #5: - Lower Leg Wound Laterality: Left, Medial Cleanser: Soap and Water 1 x Per Day/30 Days Discharge Instructions: Gently cleanse wound with antibacterial soap, rinse and pat dry prior to dressing wounds Cleanser: Vashe 5.8 (oz) 1 x Per Day/30 Days Discharge Instructions: Use vashe 5.8 (oz) as directed Peri-Wound Care: AandD Ointment 1 x Per Day/30 Days Discharge Instructions: Apply AandD Ointment as directed around edges to help dressing from sticking Topical: keystone 1 x Per Day/30 Days Prim Dressing: Hydrofera Blue Ready Transfer Foam, 2.5x2.5 (in/in) 1 x Per Day/30 Days ary Discharge Instructions: Apply Hydrofera Blue Ready to wound bed as directed cut to fit wound bed Secondary Dressing: (BORDER) Zetuvit Plus SILICONE BORDER Dressing 4x4 (in/in) 1 x Per Day/30 Days Discharge Instructions: Please do not put silicone bordered dressings under wraps. Use  non-bordered dressing only. 1. Keystone antibiotic ointment with Hydrofera Blue 2. Compression stockings daily 3. Follow-up in 1 week Electronic Signature(s) Signed: 08/08/2022 3:43:42 PM By: Geralyn Corwin DO Entered By: Geralyn Corwin on 08/08/2022 15:31:15 -------------------------------------------------------------------------------- ROS/PFSH Details Patient Name: Date of Service: [name] Moody MA J. 08/08/2022 2:45 PM Medical Record Number: [id number] Patient Account Number: [id number] Date of Birth/Sex: Treating RN: 09-11-1985 (37 y.o. [name] Primary Care Provider: [name] Other Clinician: [name] Referring Provider: Treating Provider/Extender: Emeline Darling in Treatment: [address], [place] J ([id number]) [id number] Page 9 of 10 Information Obtained From Patient Cardiovascular Medical History: Positive for: Hypertension; Peripheral Venous Disease Endocrine Medical History: Past Medical History Notes: pre DM, A1C 5.5 12/21 Immunizations Pneumococcal Vaccine: Received Pneumococcal Vaccination: No Implantable Devices None Hospitalization / Surgery History Type of Hospitalization/Surgery umb hernia repair right lower leg vein surgery Family and Social History Never smoker; Marital Status - Single; Alcohol Use: Rarely; Drug Use: Current History - marijuana; Caffeine Use: Rarely; Financial Concerns: No; Food, Clothing or Shelter Needs: No; Support System Lacking: No; Transportation Concerns: No Electronic Signature(s) Signed: 08/08/2022 3:43:42 PM By: Geralyn Corwin DO Signed: 08/08/2022 4:54:10 PM  By: Elliot Gurney, [name], RN, CWS, [name], [name] Entered By: Geralyn Corwin on 08/08/2022 15:31:47 -------------------------------------------------------------------------------- SuperBill Details Patient Name: Date of Service: [name] Moody MA J. 08/08/2022 Medical Record Number: [id number] Patient Account  Number: [id number] Date of Birth/Sex: Treating RN: Sep 04, 1985 (37 y.o. [name] Primary Care Provider: [name] Other Clinician: [name] Referring Provider: Treating Provider/Extender: Emeline Darling in Treatment: 16 Diagnosis Coding ICD-10 Codes Code Description [phone] Chronic venous hypertension (idiopathic) with ulcer of left lower extremity L97.822 Non-pressure chronic ulcer of other part of left lower leg with fat layer exposed I89.0 Lymphedema, not elsewhere classified Facility Procedures : CPT4 Code: [id number] Description: 72536 - WOUND CARE VISIT-LEV 2 EST PT Modifier: Quantity: 1 Physician Procedures Electronic Signature(s) Signed: 08/08/2022 3:43:42 PM By: Geralyn Corwin DO Entered By: Geralyn Corwin on 08/08/2022 15:31:28

## 2022-08-13 ENCOUNTER — Other Ambulatory Visit: Payer: Self-pay | Admitting: Family Medicine

## 2022-08-15 ENCOUNTER — Ambulatory Visit: Payer: BC Managed Care – PPO | Admitting: Internal Medicine

## 2022-08-22 ENCOUNTER — Encounter: Payer: BC Managed Care – PPO | Attending: Internal Medicine | Admitting: Internal Medicine

## 2022-08-22 DIAGNOSIS — I87312 Chronic venous hypertension (idiopathic) with ulcer of left lower extremity: Secondary | ICD-10-CM | POA: Insufficient documentation

## 2022-08-22 DIAGNOSIS — E669 Obesity, unspecified: Secondary | ICD-10-CM | POA: Diagnosis not present

## 2022-08-22 DIAGNOSIS — I890 Lymphedema, not elsewhere classified: Secondary | ICD-10-CM | POA: Diagnosis not present

## 2022-08-22 DIAGNOSIS — I10 Essential (primary) hypertension: Secondary | ICD-10-CM | POA: Insufficient documentation

## 2022-08-22 DIAGNOSIS — L97822 Non-pressure chronic ulcer of other part of left lower leg with fat layer exposed: Secondary | ICD-10-CM | POA: Diagnosis not present

## 2022-08-22 DIAGNOSIS — I872 Venous insufficiency (chronic) (peripheral): Secondary | ICD-10-CM | POA: Diagnosis not present

## 2022-08-22 DIAGNOSIS — Z6841 Body Mass Index (BMI) 40.0 and over, adult: Secondary | ICD-10-CM | POA: Diagnosis not present

## 2022-08-23 NOTE — Progress Notes (Signed)
[name], [name] ([id number]) [id number] Page 1 of 9 Visit Report for 08/22/2022 HPI Details Patient Name: Date of Service: [name], [name] [name] 08/22/2022 4:00 PM Medical Record Number: [id number] Patient Account Number: [id number] Date of Birth/Sex: Treating RN: May 26, 1985 (37 y.o. [name] Primary Care Provider: [name] Other Clinician: Referring Provider: Treating Provider/Extender: Chauncey Mann, MICHA EL Caleb Popp in Treatment: 18 History of Present Illness HPI Description: 37 year old patient who started with having ulcerations on the right lower leg on the lateral part of her ankle for about 2 weeks. She was seen in the ER at [facility] and advised to see the wound care for a consultation. No X-rays of workup was done during the ER visit and no prescription for any medications of compression wraps were given. the patient is not diabetic but does have hypertension and her medications have been reviewed by me. 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 The right and left greater saphenous vein was found to be tortuous. Deep venous system was also not competent and there was reflux of more than 500 ms. She was then seen by Dr. Karie Schwalbe Early who recommended that the patient would not benefit from endovenous ablation and he had recommended vein stripping odd on the right side and multiple small phlebectomy procedures on the left side. the patient did not follow-up due to social economic reasons. She has not been wearing any compression stockings and has not taken any specific treatment for varicose veins for the last 3 years. 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 We have  obtained some appointments to see the vascular surgeons by the end of the month and the patient would like to follow up with me at my [facility] on Wednesday, June 29. 10/14/2014 -- she could not see me yesterday in [place] and hence has come for a review today. She has a vascular workup to be done this afternoon at [facility] She is doing fine otherwise. 10/22/2014 -- she was seen by Dr. Brantley Fling and he has recommended surgical removal of her right saphenous vein from distal thigh to saphenofemoral odd junction and stab phlebectomy's of multiple large tributary branches throughout her thigh and calf. This would be done under general anesthesia in the outpatient setting. 10/29/2014 -- she is trying to work on a surgical date and in the meanwhile we have got insurance clearance for Apligraf and we will start this next week. 7/22 2016 -- she is here for the first application of Apligraf. 11/19/2014 -- she is here for a second application of Apligraf 11/26/2014 -- she has done fine after her last application of Apligraf and is awaiting her surgery which is scheduled for August 31. 12/03/2014 -- she is doing fine and is here for her third application of Apligraf. 12/21/2014 -- She had surgery on 12/15/2014 by Dr.Early who did #1 ligation and stripping of right great saphenous vein from distal thigh to saphenofemoral junction, #2 stab phlebectomy of large tributary varicose veins in the thigh popliteal space and calf. She had an Ace wrap up to her groin and this was removed today and the Unna's boot was also removed. 12/28/2014 -- she is here for her fourth application of  Apligraf. 01/06/2015 - he saw her vascular surgeon Dr. Arbie Cookey who was pleased with her progress and he has confirmed that no surgical procedures could be attempted on the left side. 01/13/2015 -- her wound looks very good and she's been having no problems whatsoever. Readmission: 07/26/2020 upon evaluation today patient  presents for initial inspection here in our clinic for a new issue with her left leg although she is previously been seen due to issues with the right leg back in 2016. At that time she was seeing Dr. Arbie Cookey who is a vein/vascular specialist in [place]. He has since semiretired from what I understand. He is working out of [employer] I believe. Nonetheless she tells me at the time that there was really nothing to do for her left leg although the right leg was where they did most of the work. Subsequently she states that she is done fairly well until just in the past week where she had issues with bleeding from what appears to be varicose vein on the left leg medially. Unfortunately this has continued to be an issue although she tells me at first it was coming much more significantly Down quite a bit but nonetheless has not completely resolved. Every time she showers she notices that it starts to drain a little bit more. She does have a history of chronic venous insufficiency, lymphedema, varicose veins bilaterally, and obesity. 08/02/2020 upon evaluation today patient appears to be doing about the same in regards to the ulcer on her left leg. She has some eschar covering there is definitely some fluid collecting underneath unfortunately. With that being said she tells me she is still having a tremendous amount of pain therefore she is really not able to allow me to clean this off very effectively to be perfectly honest. I think we need to try to soften this up 08/16/2020 upon evaluation today patient's wound is really not doing significantly better not really states about the same. She notes that the wrap just does not seem to be staying up very well at all unfortunately. No fevers, chills, nausea, vomiting, or diarrhea. She did cut it off once it starts to slide in order to alleviate some of the pressure from sliding Down. Fortunately there is no signs of active infection at this time which is great  news. 08/23/2020 upon evaluation today patient appears to be doing well 08/23/2020 upon evaluation today patient appears to be doing well with regard to her wound all things considered. Fortunately there does not appear to be any signs of active infection at this time which is great news. She has been tolerating the dressing changes without complication and overall I am extremely pleased with where things stand at this point. She does have her appointment with vascular in [facility] on June 9. 08/30/2020 upon evaluation today patient actually appears to be doing decently well in regard to her wound. Fortunately there is no signs of active infection which is great news. Nonetheless I do believe that the patient is going require little bit of debridement if she is okay with me attempting that today I think that will help clean off some of the necrotic tissue. Fortunately there does not appear to be otherwise any evidence of active infection which is also great news. 09/19/2020 upon evaluation today patient appears to be doing a little better in regard to her wound as compared to previous. Fortunately there does not appear to be any signs of active infection overall. No fever chills noted. I do  believe that the Iodosorb has made this a little bit better with regard to the overall size and appearance of the wound bed though again she does still have quite a ways to go to get this to heal she still very tender to touch. 09/27/2020 upon evaluation today patient appears to actually be doing quite well with regard to her wound. This is measuring much smaller which is great news. With that being said she did see vein and vascular in [place] and they subsequently recommended that surgery is really what she probably needs to go LETIA, TRUMM ([id number]) [id number] Page 2 of 9 forward with sounds like the potential for venous ablation. With that being said the patient tells me this is  just not the right time for her to be able to proceed with any type of surgery which I completely understand. Nonetheless I do believe that she would continue to benefit from compression but again that is really not something that she is able to easily do. 10/04/2020 upon evaluation today patient appears to be doing about the same in regard to her wound. This is measuring a little bit smaller but nonetheless still is open and again has some slough and biofilm noted on the surface of the wound. There does not appear to be any signs of active infection which is great news. No fevers, chills, nausea, vomiting, or diarrhea. 10/04/2020 upon evaluation today patient appears to be doing well with regard to her wound. She has been tolerating dressing changes without complication. Fortunately there does not appear to be any signs of active infection which is great news. No fevers, chills, nausea, vomiting, or diarrhea. 10/25/2020 upon evaluation today patient appears to be doing well with regard to her wound. She has been tolerating the dressing changes without complication. Fortunately there is no signs of active infection at this time. No fevers, chills, nausea, vomiting, or diarrhea. 11/01/2020 upon evaluation today patient with regard to her wound. She has been tolerating the dressing changes without complication. Fortunately there does not appear to be any signs of infection which is great news. No fever chills noted 11/15/2020 upon evaluation today patient appears to be doing well with regard to her wound. Fortunately there is no signs of active infection at this time. No fevers, chills, nausea, vomiting, or diarrhea. With that being said she continues to have a significant amount of pain at the site even though this is very close to complete closure. She also had several varicose veins around the area which were also problematic. Overall however I feel like the patient is making excellent progress. 11/28/2020  upon evaluation today patient appears to be doing well with regard to her leg ulcer. Again were not really able to debride or compression wrap her due to discomfort and pain. She does not allow for that. With that being said we have been using Iodosorb which does seem to be doing decently well. Fortunately there is no signs of active infection at this time which is great news. No fevers, chills, nausea, vomiting, or diarrhea. 8/31; patient presents for 2-week follow-up. She has been using Iodosorb. She reports that the wound is closed. She denies signs of infection. Readmission: 10-27-2021 upon evaluation this is a patient that presents today whom I have previously seen this is pretty much for the same issue though I think a little bit higher than the last time I saw her. She does have a history of chronic venous insufficiency and hypertension along with varicose veins.  Subsequently she does have an ulceration which spontaneously ruptured she has not been wearing any compression which I think is a big part of the issue here. We discussed this before I really think she probably needs to be wearing her compression therapy, she probably needs lymphedema pumps if she can ever wear the compression for a significant amount of time to get these, and subsequently also think that she needs to be elevating her legs is much as possible she may even need some vascular intervention in regard to her veins. All of this was reiterated and discussed with her today to reinforce what needs to happen in order to ensure that her legs do not get a lot worse. The patient voiced understanding. She tells me that she knows because she is seeing her mom go through a lot of this as well how bad things can get. 11-03-2021 upon evaluation today patient appears to be doing well with regard to her wound. Fortunately there does not appear to be any signs of active infection at this time. She is measuring a little bit bigger but I think  this is because the wound is actually cleaning up a bit here. 7/27; left lateral leg wound not any smaller but perhaps with a cleaner surface. She is using Iodoflex to help with the latter and using Tubigrip. She has chronic venous insufficiency with secondary lymphedema. She is apparently followed by vein and vascular and is being scheduled for an ablation 11-17-2021 upon evaluation today patient appears to be doing well with regard to her wound this is actually showing signs of improvement which is great news. Fortunately I do not see any evidence of active infection locally or systemically at this time which is great news. No fevers, chills, nausea, vomiting, or diarrhea. 11-24-2021 upon evaluation today patient's wound is actually showing signs of significant improvement. Unfortunately she had quite a bit of pain with debridement last week I do believe it was beneficial but at the same time she is doing much better but still really does not want this debrided again I think being that it is looking a whole lot better I would try to avoid that today especially since it caused her so much discomfort that is really not the goal and I explained that to the patient today she voiced understanding and knows that it needed to be done but still states that it was quite painful. 11-30-2021 upon evaluation today patient appears to be doing excellent in regard to her wound this is actually showing signs of excellent improvement I am very pleased with where things stand. She does have her venous ablation appointment for October 17. 12-21-2021 upon evaluation today patient appears to be doing better in regard to her wound this is measuring smaller and looking better as well. Fortunately I do not see any signs of active infection locally or systemically which is great news. 01-01-2022 upon evaluation today patient appears to be doing well currently in regard to her wound. She is showing signs of improvement which is  great news and overall I do not see any signs of active infection locally or systemically at this time. 01-08-2022 upon evaluation today patient appears to be doing well currently in regard to her wound she is actually showing signs of significant improvement which is great news. Fortunately I do not see any evidence of active infection locally or systemically at this time. I do believe that we are on the right track. She also has her appointment October  19 for the venous ablation. 01-16-2022 upon evaluation today patient's wound actually is showing some signs of improvement although this is very slow. Fortunately I do not see any evidence of infection at this time. The volume is a little bit more although the size is smaller I think this is due to the fact that we are slowly cleaning this area out effectively. 11/6 continued improvement the patient is using Iodoflex and a Tubigrip E. She was supposed to have venous surgery at vein and vascular in [place] however somehow this is gotten delayed till December 14. 02-27-2022 upon evaluation today patient's wound is actually showing signs of being completely healed. Fortunately I do not see any evidence of active infection locally or systemically which is great news and overall I am extremely pleased with where we stand today. Unfortunately she does tell me that she postpone her venous ablation surgery until December 14 she tells me that she was not ready "financially" for this. 04/18/2022; Ms. Flannery Smithart is a 37 year old female with a past medical history of venous insufficiency that presents the clinic for a left lower extremity wound. She was seen almost 2 months ago for the same wound. This had healed with Iodoflex and Tubigrip. She states that the wound recently reopened. She has seen vein and vascular and plan is for ligation and stripping of the left great saphenous vein. She is not sure when this procedure is going to be scheduled. She has  canceled it once before. She has had office compression wraps in the past however these do not stay on and create more of an issue for her. She would like to avoid this. She currently denies signs of infection. 1/10; patient presents for follow-up. She has been using Iodoflex to the wound bed. She states she has been using her Tubigrip however she does not have this on today. She has no issues or complaints today. She denies signs of infection. 1/17; patient presents for follow-up. She has not been using Iodoflex to the wound bed. She reports acute pain. She denies increased warmth, erythema or purulent drainage to the left lower extremity. She states she has been using Tubigrip. She has information to order the compression stockings but has not obtained them. 1/24; Patient had a wound culture done at last clinic visit that grew Proteus mirabilis and E. coli. She was prescribed levofloxacin and this should cover both bacteria. She has been taking the medication over the past week with improvement of symptoms to the wound bed. She has been [address] under MECHILLE, MATUSIAK ([id number]) [id number] Page 3 of 9 Tubigrip. She states the [facility] is sticking to her wound bed. 1/31; patient presents for follow-up. She has been using Medihoney to the wound bed with improvement in healing. She has been contacted by [facility] to order her antibiotic ointment. She is not sure yet if she is able to afford this. She received her compression stockings but states they did not fit well. She is working on returning these. She has been using Tubigrip. 2/7; patient presents for follow-up. She is been using Medihoney to the wound bed. She obtained her compression stockings and has been using these daily. She states that the [facility] antibiotic ointment is arriving in the mail tomorrow. She has no issues or complaints today. She denies systemic signs of infection. 2/14; patient  presents for follow-up. She has been using Keystone antibiotic ointment to the wound bed. She states the [facility] is sticking and she has  stopped this. She is been using her compression stockings daily. She does not have these on today. 2/28; patient presents for follow-up. She continues to use [facility] antibiotic ointment to the wound bed. There is been improvement in healing. She has been using her compression stockings daily. 3/12; patient presents for follow-up. She has been using Keystone antibiotic ointment to the wound bed. She is not wearing her compression stockings today but states she does wear them daily. She declines debridement today. 3/20; patient presents for follow-up. She has been using Keystone antibiotic ointment with Hydrofera Blue. She states she is wearing her compression stockings daily but she does not have them on today. 3/27; patient presents for follow-up. She has been using Keystone antibiotic ointment and hydrofera blue to the wound bed. She reports wearing compression stockings. 4/10; patient presents for follow-up. She has been using Keystone antibiotic ointment and Hydrofera Blue to the wound bed. She reports wearing compression stockings. She has no issues or complaints today. 4/24; patient presents for follow-up. She has been using Keystone antibiotic ointment and Hydrofera Blue to the wound bed. Overall there is healthier tissue today. She reports wearing her compression stockings daily. 5/8; patient comes into clinic today with outer stocking on the left leg and with no Keystone. She has small wounds on the medial left lower leg and has been using Keystone and [employer]	[place] at home. 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 She has chronic venous hypertension and stage III lymphedema. She came in today saying she did not want debridement and really was not interested in compression wraps saying they fall down consistently. She does not  have compression pumps Electronic Signature(s) Signed: 08/22/2022 4:41:35 PM By: Baltazar Najjar MD Entered By: Baltazar Najjar on 08/22/2022 16:38:10 -------------------------------------------------------------------------------- Physical Exam Details Patient Name: Date of Service: [name] 08/22/2022 4:00 PM Medical Record Number: [id number] Patient Account Number: [id number] Date of Birth/Sex: Treating RN: [birth year] (37 y.o. [name] Primary Care Provider: [name] Other Clinician: Referring Provider: Treating Provider/Extender: RO BSO Dorris Carnes, MICHA EL Caleb Popp in Treatment: 18 Constitutional Patient is hypertensive.. Pulse regular and within target range for patient.Marland Kitchen Respirations regular, non-labored and within target range.Marland Kitchen appears in no distress. Cardiovascular Stage III lymphedema. Nonpitting edema extending well into the thighs. She also has clear evidence of venous hypertension. Notes Wound exam; left lower extremity 3 small wounds with some depth. The surface is not completely cleaned off yet. No evidence of infection around the wounds. She has uncontrolled edema, severe hemosiderin deposition. As well she has evidence of venous hypertension and lymphedema Electronic Signature(s) Signed: 08/22/2022 4:41:35 PM By: Baltazar Najjar MD Entered By: Baltazar Najjar on 08/22/2022 16:39:28 -------------------------------------------------------------------------------- Physician Orders Details Patient Name: Date of Service: [name] 08/22/2022 4:00 PM Medical Record Number: [id number] Patient Account Number: [id number] Date of Birth/Sex: Treating RN: October 29, 1985 (37 y.o. [name] Primary Care Provider: [name] Other Clinician: Referring Provider: Treating Provider/Extender: Chauncey Mann, MICHA EL Caleb Popp in Treatment: [address] / Phone Orders: No ILIAN, RILING ([id number])  [id number] Page 4 of 9 Diagnosis Coding Follow-up Appointments Wound #5 Left,Medial Lower Leg Return Appointment in 2 weeks. Bathing/ [employer] wounds with antibacterial soap and water. May shower with wound dressing protected with water repellent cover or cast protector. No tub bath. Edema Control - Lymphedema / Segmental Compressive Device / Other Wound #5 Left,Medial Lower Leg Patient to wear  own compression stockings. Remove compression stockings every night before going to bed and put on every morning when getting up. - LLE, Elevate, Exercise Daily and A void Standing for Long Periods of Time. Elevate legs to the level of the heart and pump ankles as often as possible Elevate leg(s) parallel to the floor when sitting. Medications-Please add to medication list. Wound #5 Left,Medial Lower Leg Keystone Compound Wound Treatment Wound #5 - Lower Leg Wound Laterality: Left, Medial Cleanser: Soap and Water 1 x Per Day/30 Days Discharge Instructions: Gently cleanse wound with antibacterial soap, rinse and pat dry prior to dressing wounds Cleanser: Vashe 5.8 (oz) 1 x Per Day/30 Days Discharge Instructions: Use vashe 5.8 (oz) as directed Peri-Wound Care: AandD Ointment 1 x Per Day/30 Days Discharge Instructions: Apply AandD Ointment as directed around edges to help dressing from sticking Topical: keystone 1 x Per Day/30 Days Prim Dressing: Hydrofera Blue Ready Transfer Foam, 2.5x2.5 (in/in) 1 x Per Day/30 Days ary Discharge Instructions: Apply Hydrofera Blue Ready to wound bed as directed cut to fit wound bed Secondary Dressing: (BORDER) Zetuvit Plus SILICONE BORDER Dressing 4x4 (in/in) 1 x Per Day/30 Days Discharge Instructions: Please do not put silicone bordered dressings under wraps. Use non-bordered dressing only. Electronic Signature(s) Signed: 08/22/2022 4:41:35 PM By: Baltazar Najjar MD Signed: 08/22/2022 5:05:57 PM By: Midge Aver MSN RN CNS  WTA Entered By: Midge Aver on 08/22/2022 16:35:20 -------------------------------------------------------------------------------- Problem List Details Patient Name: Date of Service: [name] 08/22/2022 4:00 PM Medical Record Number: [id number] Patient Account Number: [id number] Date of Birth/Sex: Treating RN: [birth year] (37 y.o. [name] Primary Care Provider: [name] Other Clinician: Referring Provider: Treating Provider/Extender: Chauncey Mann, MICHA EL Caleb Popp in Treatment: 18 Active Problems ICD-10 Encounter Code Description Active Date MDM Diagnosis I87.312 Chronic venous hypertension (idiopathic) with ulcer of left lower extremity 04/18/2022 No Yes L97.822 Non-pressure chronic ulcer of other part of left lower leg with fat layer exposed1/06/2022 No Yes DLYNN, HATAWAY ([id number]) [id number] Page 5 of 9 I89.0 Lymphedema, not elsewhere classified 04/18/2022 No Yes Inactive Problems Resolved Problems Electronic Signature(s) Signed: 08/22/2022 4:41:35 PM By: Baltazar Najjar MD Entered By: Baltazar Najjar on 08/22/2022 16:36:52 -------------------------------------------------------------------------------- Progress Note Details Patient Name: Date of Service: [name] 08/22/2022 4:00 PM Medical Record Number: [id number] Patient Account Number: [id number] Date of Birth/Sex: Treating RN: Oct 23, 1985 (37 y.o. [name] Primary Care Provider: [name] Other Clinician: Referring Provider: Treating Provider/Extender: Chauncey Mann, MICHA EL Caleb Popp in Treatment: 18 Subjective History of Present Illness (HPI) 37 year old patient who started with having ulcerations on the right lower leg on the lateral part of her ankle for about 2 weeks. She was seen in the ER at [facility] and advised to see the wound care for a consultation. No X-rays of workup was done during the ER visit and no  prescription for any medications of compression wraps were given. the patient is not diabetic but does have hypertension and her medications have been reviewed by me. 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 The right and left greater saphenous vein was found to be tortuous. Deep venous system was also not competent and there was reflux of more than 500 ms. She was then seen by Dr. Karie Schwalbe Early who recommended that  the patient would not benefit from endovenous ablation and he had recommended vein stripping odd on the right side and multiple small phlebectomy procedures on the left side. the patient did not follow-up due to social economic reasons. She has not been wearing any compression stockings and has not taken any specific treatment for varicose veins for the last 3 years. 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 We have obtained some appointments to see the vascular surgeons by the end of the month and the patient would like to follow up with me at my [facility] on Wednesday, June 29. 10/14/2014 -- she could not see me yesterday in [place] and hence has come for a review today. She has a vascular workup to be done this afternoon at [facility]. She is doing fine otherwise. 10/22/2014 -- she was seen by Dr. Brantley Fling and he has recommended surgical removal of her right saphenous vein from distal thigh to saphenofemoral odd junction and stab phlebectomy's of multiple large tributary branches throughout her thigh and calf. This would be done under general anesthesia in the outpatient setting. 10/29/2014 -- she is trying to work on a surgical date and in the meanwhile we have got insurance clearance for Apligraf and we  will start this next week. 7/22 2016 -- she is here for the first application of Apligraf. 11/19/2014 -- she is here for a second application of Apligraf 11/26/2014 -- she has done fine after her last application of Apligraf and is awaiting her surgery which is scheduled for August 31. 12/03/2014 -- she is doing fine and is here for her third application of Apligraf. 12/21/2014 -- She had surgery on 12/15/2014 by Dr.Early who did #1 ligation and stripping of right great saphenous vein from distal thigh to saphenofemoral junction, #2 stab phlebectomy of large tributary varicose veins in the thigh popliteal space and calf. She had an Ace wrap up to her groin and this was removed today and the Unna's boot was also removed. 12/28/2014 -- she is here for her fourth application of Apligraf. 01/06/2015 - he saw her vascular surgeon Dr. Arbie Cookey who was pleased with her progress and he has confirmed that no surgical procedures could be attempted on the left side. 01/13/2015 -- her wound looks very good and she's been having no problems whatsoever. Readmission: 07/26/2020 upon evaluation today patient presents for initial inspection here in our clinic for a new issue with her left leg although she is previously been seen due to issues with the right leg back in 2016. At that time she was seeing Dr. Arbie Cookey who is a vein/vascular specialist in [place]. He has since semiretired from what I understand. He is working out of [employer] I believe. Nonetheless she tells me at the time that there was really nothing to do for her left leg although the right leg was where they did most of the work. Subsequently she states that she is done fairly well until just in the past week where she had issues with bleeding from what appears to be varicose vein on the left leg medially. Unfortunately this has continued to be an issue although she tells me at first it was coming much more significantly Down quite a bit but  nonetheless has not completely resolved. Every time she showers she notices that it starts to drain a little bit more. She does have a history of chronic venous  insufficiency, lymphedema, varicose veins bilaterally, and obesity. 08/02/2020 upon evaluation today patient appears to be doing about the same in regards to the ulcer on her left leg. She has some eschar covering there is definitely some fluid collecting underneath unfortunately. With that being said she tells me she is still having a tremendous amount of pain therefore she is really not able to allow me to clean this off very effectively to be perfectly honest. I think we need to try to soften this up 08/16/2020 upon evaluation today patient's wound is really not doing significantly better not really states about the same. She notes that the wrap just does not seem to be staying up very well at all unfortunately. No fevers, chills, nausea, vomiting, or diarrhea. She did cut it off once it starts to slide in order to alleviate some of the pressure from sliding Down. Fortunately there is no signs of active infection at this time which is great news. SHAUN, RUBEL ([id number]) [id number] Page 6 of 9 08/23/2020 upon evaluation today patient appears to be doing well 08/23/2020 upon evaluation today patient appears to be doing well with regard to her wound all things considered. Fortunately there does not appear to be any signs of active infection at this time which is great news. She has been tolerating the dressing changes without complication and overall I am extremely pleased with where things stand at this point. She does have her appointment with vascular in [facility] on June 9. 08/30/2020 upon evaluation today patient actually appears to be doing decently well in regard to her wound. Fortunately there is no signs of active infection which is great news. Nonetheless I do believe that the patient is going require  little bit of debridement if she is okay with me attempting that today I think that will help clean off some of the necrotic tissue. Fortunately there does not appear to be otherwise any evidence of active infection which is also great news. 09/19/2020 upon evaluation today patient appears to be doing a little better in regard to her wound as compared to previous. Fortunately there does not appear to be any signs of active infection overall. No fever chills noted. I do believe that the Iodosorb has made this a little bit better with regard to the overall size and appearance of the wound bed though again she does still have quite a ways to go to get this to heal she still very tender to touch. 09/27/2020 upon evaluation today patient appears to actually be doing quite well with regard to her wound. This is measuring much smaller which is great news. With that being said she did see vein and vascular in [facility] and they subsequently recommended that surgery is really what she probably needs to go forward with sounds like the potential for venous ablation. With that being said the patient tells me this is just not the right time for her to be able to proceed with any type of surgery which I completely understand. Nonetheless I do believe that she would continue to benefit from compression but again that is really not something that she is able to easily do. 10/04/2020 upon evaluation today patient appears to be doing about the same in regard to her wound. This is measuring a little bit smaller but nonetheless still is open and again has some slough and biofilm noted on the surface of the wound. There does not appear to be any signs of active infection which is great news. No  fevers, chills, nausea, vomiting, or diarrhea. 10/04/2020 upon evaluation today patient appears to be doing well with regard to her wound. She has been tolerating dressing changes without complication. Fortunately there does not appear  to be any signs of active infection which is great news. No fevers, chills, nausea, vomiting, or diarrhea. 10/25/2020 upon evaluation today patient appears to be doing well with regard to her wound. She has been tolerating the dressing changes without complication. Fortunately there is no signs of active infection at this time. No fevers, chills, nausea, vomiting, or diarrhea. 11/01/2020 upon evaluation today patient with regard to her wound. She has been tolerating the dressing changes without complication. Fortunately there does not appear to be any signs of infection which is great news. No fever chills noted 11/15/2020 upon evaluation today patient appears to be doing well with regard to her wound. Fortunately there is no signs of active infection at this time. No fevers, chills, nausea, vomiting, or diarrhea. With that being said she continues to have a significant amount of pain at the site even though this is very close to complete closure. She also had several varicose veins around the area which were also problematic. Overall however I feel like the patient is making excellent progress. 11/28/2020 upon evaluation today patient appears to be doing well with regard to her leg ulcer. Again were not really able to debride or compression wrap her due to discomfort and pain. She does not allow for that. With that being said we have been using Iodosorb which does seem to be doing decently well. Fortunately there is no signs of active infection at this time which is great news. No fevers, chills, nausea, vomiting, or diarrhea. 8/31; patient presents for 2-week follow-up. She has been using Iodosorb. She reports that the wound is closed. She denies signs of infection. Readmission: 10-27-2021 upon evaluation this is a patient that presents today whom I have previously seen this is pretty much for the same issue though I think a little bit higher than the last time I saw her. She does have a history of  chronic venous insufficiency and hypertension along with varicose veins. Subsequently she does have an ulceration which spontaneously ruptured she has not been wearing any compression which I think is a big part of the issue here. We discussed this before I really think she probably needs to be wearing her compression therapy, she probably needs lymphedema pumps if she can ever wear the compression for a significant amount of time to get these, and subsequently also think that she needs to be elevating her legs is much as possible she may even need some vascular intervention in regard to her veins. All of this was reiterated and discussed with her today to reinforce what needs to happen in order to ensure that her legs do not get a lot worse. The patient voiced understanding. She tells me that she knows because she is seeing her mom go through a lot of this as well how bad things can get. 11-03-2021 upon evaluation today patient appears to be doing well with regard to her wound. Fortunately there does not appear to be any signs of active infection at this time. She is measuring a little bit bigger but I think this is because the wound is actually cleaning up a bit here. 7/27; left lateral leg wound not any smaller but perhaps with a cleaner surface. She is using Iodoflex to help with the latter and using Tubigrip. She has chronic  venous insufficiency with secondary lymphedema. She is apparently followed by vein and vascular and is being scheduled for an ablation 11-17-2021 upon evaluation today patient appears to be doing well with regard to her wound this is actually showing signs of improvement which is great news. Fortunately I do not see any evidence of active infection locally or systemically at this time which is great news. No fevers, chills, nausea, vomiting, or diarrhea. 11-24-2021 upon evaluation today patient's wound is actually showing signs of significant improvement. Unfortunately she had  quite a bit of pain with debridement last week I do believe it was beneficial but at the same time she is doing much better but still really does not want this debrided again I think being that it is looking a whole lot better I would try to avoid that today especially since it caused her so much discomfort that is really not the goal and I explained that to the patient today she voiced understanding and knows that it needed to be done but still states that it was quite painful. 11-30-2021 upon evaluation today patient appears to be doing excellent in regard to her wound this is actually showing signs of excellent improvement I am very pleased with where things stand. She does have her venous ablation appointment for October 17. 12-21-2021 upon evaluation today patient appears to be doing better in regard to her wound this is measuring smaller and looking better as well. Fortunately I do not see any signs of active infection locally or systemically which is great news. 01-01-2022 upon evaluation today patient appears to be doing well currently in regard to her wound. She is showing signs of improvement which is great news and overall I do not see any signs of active infection locally or systemically at this time. 01-08-2022 upon evaluation today patient appears to be doing well currently in regard to her wound she is actually showing signs of significant improvement which is great news. Fortunately I do not see any evidence of active infection locally or systemically at this time. I do believe that we are on the right track. She also has her appointment October 19 for the venous ablation. 01-16-2022 upon evaluation today patient's wound actually is showing some signs of improvement although this is very slow. Fortunately I do not see any evidence of infection at this time. The volume is a little bit more although the size is smaller I think this is due to the fact that we are slowly cleaning this area out  effectively. 11/6 continued improvement the patient is using Iodoflex and a Tubigrip E. She was supposed to have venous surgery at vein and vascular in [place] however somehow this is gotten delayed till December 14. 02-27-2022 upon evaluation today patient's wound is actually showing signs of being completely healed. Fortunately I do not see any evidence of active infection locally or systemically which is great news and overall I am extremely pleased with where we stand today. Unfortunately she does tell me that she postpone her venous ablation surgery until December 14 she tells me that she was not ready "financially" for this. CAILIE, QUINTON ([id number]) [id number] Page 7 of 9 04/18/2022; Ms. Bambie Mcrae is a 37 year old female with a past medical history of venous insufficiency that presents the clinic for a left lower extremity wound. She was seen almost 2 months ago for the same wound. This had healed with Iodoflex and Tubigrip. She states that the wound recently reopened. She has seen vein and  vascular and plan is for ligation and stripping of the left great saphenous vein. She is not sure when this procedure is going to be scheduled. She has canceled it once before. She has had office compression wraps in the past however these do not stay on and create more of an issue for her. She would like to avoid this. She currently denies signs of infection. 1/10; patient presents for follow-up. She has been using Iodoflex to the wound bed. She states she has been using her Tubigrip however she does not have this on today. She has no issues or complaints today. She denies signs of infection. 1/17; patient presents for follow-up. She has not been using Iodoflex to the wound bed. She reports acute pain. She denies increased warmth, erythema or purulent drainage to the left lower extremity. She states she has been using Tubigrip. She has information to order the  compression stockings but has not obtained them. 1/24; Patient had a wound culture done at last clinic visit that grew Proteus mirabilis and E. coli. She was prescribed levofloxacin and this should cover both bacteria. She has been taking the medication over the past week with improvement of symptoms to the wound bed. She has been using Hydrofera Blue under Tubigrip. She states the [facility] is sticking to her wound bed. 1/31; patient presents for follow-up. She has been using Medihoney to the wound bed with improvement in healing. She has been contacted by [facility] to order her antibiotic ointment. She is not sure yet if she is able to afford this. She received her compression stockings but states they did not fit well. She is working on returning these. She has been using Tubigrip. 2/7; patient presents for follow-up. She is been using Medihoney to the wound bed. She obtained her compression stockings and has been using these daily. She states that the [facility] antibiotic ointment is arriving in the mail tomorrow. She has no issues or complaints today. She denies systemic signs of infection. 2/14; patient presents for follow-up. She has been using Keystone antibiotic ointment to the wound bed. She states the [facility] is sticking and she has stopped this. She is been using her compression stockings daily. She does not have these on today. 2/28; patient presents for follow-up. She continues to use [facility] antibiotic ointment to the wound bed. There is been improvement in healing. She has been using her compression stockings daily. 3/12; patient presents for follow-up. She has been using Keystone antibiotic ointment to the wound bed. She is not wearing her compression stockings today but states she does wear them daily. She declines debridement today. 3/20; patient presents for follow-up. She has been using Keystone antibiotic ointment with Hydrofera Blue. She states she is wearing her  compression stockings daily but she does not have them on today. 3/27; patient presents for follow-up. She has been using Keystone antibiotic ointment and hydrofera blue to the wound bed. She reports wearing compression stockings. 4/10; patient presents for follow-up. She has been using Keystone antibiotic ointment and Hydrofera Blue to the wound bed. She reports wearing compression stockings. She has no issues or complaints today. 4/24; patient presents for follow-up. She has been using Keystone antibiotic ointment and Hydrofera Blue to the wound bed. Overall there is healthier tissue today. She reports wearing her compression stockings daily. 5/8; patient comes into clinic today with outer stocking on the left leg and with no Keystone. She has small wounds on the medial left lower leg and has been  using Keystone and [employer]	[place] at home. 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 She has chronic venous hypertension and stage III lymphedema. She came in today saying she did not want debridement and really was not interested in compression wraps saying they fall down consistently. She does not have compression pumps Objective Constitutional Patient is hypertensive.. Pulse regular and within target range for patient.Marland Kitchen Respirations regular, non-labored and within target range.Marland Kitchen appears in no distress. Vitals Time Taken: 4:21 PM, Height: 66 in, Weight: 400 lbs, BMI: 64.6, Temperature: 98.2 F, Pulse: 95 bpm, Respiratory Rate: 16 breaths/min, Blood Pressure: 145/98 mmHg. Cardiovascular Stage III lymphedema. Nonpitting edema extending well into the thighs. She also has clear evidence of venous hypertension. General Notes: Wound exam; left lower extremity 3 small wounds with some depth. The surface is not completely cleaned off yet. No evidence of infection around the wounds. She has uncontrolled edema, severe hemosiderin deposition. As well she has evidence of venous hypertension  and lymphedema Integumentary (Hair, Skin) Wound #5 status is Open. Original cause of wound was Gradually Appeared. The date acquired was: 04/10/2022. The wound has been in treatment 18 weeks. The wound is located on the Left,Medial Lower Leg. The wound measures 1cm length x 3cm width x 0.2cm depth; 2.356cm^2 area and 0.471cm^3 volume. There is Fat Layer (Subcutaneous Tissue) exposed. There is a medium amount of serosanguineous drainage noted. There is medium (34-66%) red granulation within the wound bed. There is a medium (34-66%) amount of necrotic tissue within the wound bed including Adherent Slough. Assessment Active Problems ICD-10 Chronic venous hypertension (idiopathic) with ulcer of left lower extremity LAMAYAH, KROON ([id number]) [id number] Page 8 of 9 Non-pressure chronic ulcer of other part of left lower leg with fat layer exposed Lymphedema, not elsewhere classified Plan Follow-up Appointments: Wound #5 Left,Medial Lower Leg: Return Appointment in 2 weeks. Bathing/ Shower/ Hygiene: Wash wounds with antibacterial soap and water. May shower with wound dressing protected with water repellent cover or cast protector. No tub bath. Edema Control - Lymphedema / Segmental Compressive Device / Other: Wound #5 Left,Medial Lower Leg: Patient to wear own compression stockings. Remove compression stockings every night before going to bed and put on every morning when getting up. - LLE, Elevate, Exercise Daily and Avoid Standing for Long Periods of Time. Elevate legs to the level of the heart and pump ankles as often as possible Elevate leg(s) parallel to the floor when sitting. Medications-Please add to medication list.: Wound #5 Left,Medial Lower Leg: Keystone Compound WOUND #5: - Lower Leg Wound Laterality: Left, Medial Cleanser: Soap and Water 1 x Per Day/30 Days Discharge Instructions: Gently cleanse wound with antibacterial soap, rinse and pat dry  prior to dressing wounds Cleanser: Vashe 5.8 (oz) 1 x Per Day/30 Days Discharge Instructions: Use vashe 5.8 (oz) as directed Peri-Wound Care: AandD Ointment 1 x Per Day/30 Days Discharge Instructions: Apply AandD Ointment as directed around edges to help dressing from sticking Topical: keystone 1 x Per Day/30 Days Prim Dressing: Hydrofera Blue Ready Transfer Foam, 2.5x2.5 (in/in) 1 x Per Day/30 Days ary Discharge Instructions: Apply Hydrofera Blue Ready to wound bed as directed cut to fit wound bed Secondary Dressing: (BORDER) Zetuvit Plus SILICONE BORDER Dressing 4x4 (in/in) 1 x Per Day/30 Days Discharge Instructions: Please do not put silicone bordered dressings under wraps. Use non-bordered dressing only. 1. She would not allow debridement or compression wraps. I did not argue with her 2 I think her compliance with  her own stockings which I think are from elastic therapy is probably not very good either. 3. I told her I am worried about this leg vis--vis enlargement of these wounds are recurrent wounds. Electronic Signature(s) Signed: 08/22/2022 4:41:35 PM By: Baltazar Najjar MD Entered By: Baltazar Najjar on 08/22/2022 16:40:12 -------------------------------------------------------------------------------- SuperBill Details Patient Name: Date of Service: [name] 08/22/2022 Medical Record Number: [id number] Patient Account Number: [id number] Date of Birth/Sex: Treating RN: November 06, 1985 (37 y.o. 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 I89.0 Lymphedema, not elsewhere classified Facility Procedures : CPT4 Code:  11914782 Description: 99213 - WOUND CARE VISIT-LEV 3 EST PT Modifier: Quantity: 1 Physician Procedures : CPT4 Code Description Modifier [id number] 99213 - WC PHYS LEVEL 3 - EST PT LAKEYSHIA, SARASIN ([id number]) [id number] ICD-10 Diagnosis Description 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

## 2022-08-23 NOTE — Progress Notes (Signed)
[name], [name] ([id number]) [id number] Page 1 of 7 Visit Report for 08/22/2022 Arrival Information Details Patient Name: Date of Service: [name], [name] [name] 08/22/2022 4:00 PM Medical Record Number: [id number] Patient Account Number: [id number] Date of Birth/Sex: Treating RN: [birth year] (37 y.o. [name] Primary Care Zacherie Honeyman: [name] Other Clinician: Referring Lashaye Fisk: Treating Erine Phenix/Extender: RO BSO Dorris Carnes, MICHA EL Caleb Popp in Treatment: 18 Visit Information History Since Last Visit Added or deleted any medications: No Patient Arrived: Ambulatory Has Dressing in Place as Prescribed: Yes Arrival Time: 16:20 Pain Present Now: No Accompanied By: self Transfer Assistance: None Patient Identification Verified: Yes Secondary Verification Process Completed: Yes Patient Requires Transmission-Based Precautions: No Patient Has Alerts: No Electronic Signature(s) Signed: 08/22/2022 4:46:39 PM By: Midge Aver MSN RN CNS WTA Entered By: Midge Aver on 08/22/2022 16:46:38 -------------------------------------------------------------------------------- Clinic Level of Care Assessment Details Patient Name: Date of Service: [name], [name] [name] 08/22/2022 4:00 PM Medical Record Number: [id number] Patient Account Number: [id number] Date of Birth/Sex: Treating RN: [birth year] (37 y.o. [name] Primary Care Maud Rubendall: [name] Other Clinician: Referring Giulian Goldring: Treating Starkisha Tullis/Extender: RO BSO Dorris Carnes, MICHA EL Caleb Popp in Treatment: 18 Clinic Level of Care Assessment Items TOOL 4 Quantity Score X- 1 0 Use when only an EandM is performed on FOLLOW-UP visit ASSESSMENTS - Nursing Assessment / Reassessment X- 1 10 Reassessment of Co-morbidities (includes updates in patient status) X- 1 5 Reassessment of Adherence to Treatment Plan ASSESSMENTS - Wound and Skin A ssessment / Reassessment X - Simple Wound Assessment /  Reassessment - one wound 1 5 []  - 0 Complex Wound Assessment / Reassessment - multiple wounds []  - 0 Dermatologic / Skin Assessment (not related to wound area) ASSESSMENTS - Focused Assessment []  - 0 Circumferential Edema Measurements - multi extremities []  - 0 Nutritional Assessment / Counseling / Intervention []  - 0 Lower Extremity Assessment (monofilament, tuning fork, pulses) []  - 0 Peripheral Arterial Disease Assessment (using hand held doppler) ASSESSMENTS - Ostomy and/or Continence Assessment and Care []  - 0 Incontinence Assessment and Management []  - 0 Ostomy Care Assessment and Management (repouching, etc.) PROCESS - Coordination of Care X - Simple Patient / Family Education for ongoing care 1 15 []  - 0 Complex (extensive) Patient / Family Education for ongoing care X- 1 10 Staff obtains Consents, Records, T Results / Process Orders est [name], [name] ([id number]) [id number] Page 2 of 7 []  - 0 Staff telephones HHA, Nursing Homes / Clarify orders / etc []  - 0 Routine Transfer to another Facility (non-emergent condition) []  - 0 Routine Hospital Admission (non-emergent condition) []  - 0 New Admissions / Manufacturing engineer / Ordering NPWT Apligraf, etc. , []  - 0 Emergency Hospital Admission (emergent condition) X- 1 10 Simple Discharge Coordination []  - 0 Complex (extensive) Discharge Coordination PROCESS - Special Needs []  - 0 Pediatric / Minor Patient Management []  - 0 Isolation Patient Management []  - 0 Hearing / Language / Visual special needs []  - 0 Assessment of Community assistance (transportation, D/C planning, etc.) []  - 0 Additional assistance / Altered mentation []  - 0 Support Surface(s) Assessment (bed, cushion, seat, etc.) INTERVENTIONS - Wound Cleansing / Measurement X - Simple Wound Cleansing - one wound 1 5 []  - 0 Complex Wound Cleansing - multiple wounds X- 1 5 Wound Imaging (photographs - any number  of wounds) []  - 0 Wound Tracing (instead of photographs) X- 1 5 Simple Wound Measurement - one wound []  - 0 Complex Wound Measurement -  multiple wounds INTERVENTIONS - Wound Dressings X - Small Wound Dressing one or multiple wounds 1 10 []  - 0 Medium Wound Dressing one or multiple wounds []  - 0 Large Wound Dressing one or multiple wounds []  - 0 Application of Medications - topical []  - 0 Application of Medications - injection INTERVENTIONS - Miscellaneous []  - 0 External ear exam []  - 0 Specimen Collection (cultures, biopsies, blood, body fluids, etc.) []  - 0 Specimen(s) / Culture(s) sent or taken to Lab for analysis []  - 0 Patient Transfer (multiple staff / Michiel Sites Lift / Similar devices) []  - 0 Simple Staple / Suture removal (25 or less) []  - 0 Complex Staple / Suture removal (26 or more) []  - 0 Hypo / Hyperglycemic Management (close monitor of Blood Glucose) []  - 0 Ankle / Brachial Index (ABI) - do not check if billed separately X- 1 5 Vital Signs Has the patient been seen at the hospital within the last three years: Yes Total Score: 85 Level Of Care: New/Established - Level 3 Electronic Signature(s) Signed: 08/22/2022 5:05:57 PM By: Midge Aver MSN RN CNS WTA Entered By: Midge Aver on 08/22/2022 16:36:06 [name] ([id number]) [id number] Page 3 of 7 -------------------------------------------------------------------------------- Encounter Discharge Information Details Patient Name: Date of Service: [name], [name] [name] 08/22/2022 4:00 PM Medical Record Number: [id number] Patient Account Number: [id number] Date of Birth/Sex: Treating RN: 04-18-1985 (37 y.o. [name] Primary Care [name]: [name] Other Clinician: Referring Kateryna Grantham: Treating Ramey Schiff/Extender: RO BSO Dorris Carnes, MICHA EL Caleb Popp in Treatment: 18 Encounter Discharge Information Items Discharge Condition: Stable Ambulatory Status:  Ambulatory Discharge Destination: Home Transportation: Private Auto Accompanied By: self Schedule Follow-up Appointment: Yes Clinical Summary of Care: Electronic Signature(s) Signed: 08/22/2022 4:48:30 PM By: Midge Aver MSN RN CNS WTA Entered By: Midge Aver on 08/22/2022 16:48:30 -------------------------------------------------------------------------------- Lower Extremity Assessment Details Patient Name: Date of Service: [name] Moody [name] 08/22/2022 4:00 PM Medical Record Number: [id number] Patient Account Number: [id number] Date of Birth/Sex: Treating RN: 04-05-1986 (37 y.o. [name] Primary Care Britanee Vanblarcom: [name] Other Clinician: Referring Elisabet Gutzmer: Treating Keeghan Bialy/Extender: Chauncey Mann, MICHA EL Caleb Popp in Treatment: 18 Edema Assessment Assessed: [Left: Yes] [Right: No] Edema: [Left: N] [Right: o] Calf Left: Right: Point of Measurement: 32 cm From Medial Instep 57 cm Ankle Left: Right: Point of Measurement: 12 cm From Medial Instep 33 cm Vascular Assessment Pulses: Dorsalis Pedis Palpable: [Left:Yes] Electronic Signature(s) Signed: 08/22/2022 4:47:12 PM By: Midge Aver MSN RN CNS WTA Entered By: Midge Aver on 08/22/2022 16:47:12 -------------------------------------------------------------------------------- Multi Wound Chart Details Patient Name: Date of Service: [name] Moody [name] 08/22/2022 4:00 PM Medical Record Number: [id number] Patient Account Number: [id number] Date of Birth/Sex: Treating RN: 11-Sep-1985 (37 y.o. [name] Primary Care Cecilia Vancleve: [name] Other Clinician: Referring Tiara Maultsby: Treating Azari Janssens/Extender: Chauncey Mann, MICHA EL Caleb Popp in Treatment: 18 Vital Signs Height(in): 66 Pulse(bpm): 95 [name], [name] ([id number]) [id number] Page 4 of 7 Weight(lbs): 400 Blood Pressure(mmHg): 145/98 Body Mass Index(BMI): 64.6 Temperature(F): 98.2 Respiratory  Rate(breaths/min): 16 [5:Photos:] [N/A:N/A] Left, Medial Lower Leg N/A N/A Wound Location: Gradually Appeared N/A N/A Wounding Event: Venous Leg Ulcer N/A N/A Primary Etiology: Hypertension, Peripheral Venous N/A N/A Comorbid History: Disease 04/10/2022 N/A N/A Date Acquired: 38 N/A N/A Weeks of Treatment: Open N/A N/A Wound Status: No N/A N/A Wound Recurrence: 1x3x0.2 N/A N/A Measurements L x W x D (cm) 2.356 N/A N/A A (cm) : rea 0.471 N/A N/A Volume (cm) :  57.10% N/A N/A % Reduction in Area: 57.20% N/A N/A % Reduction in Volume: Full Thickness Without Exposed N/A N/A Classification: Support Structures Medium N/A N/A Exudate Amount: Serosanguineous N/A N/A Exudate Type: red, brown N/A N/A Exudate Color: Medium (34-66%) N/A N/A Granulation Amount: Red N/A N/A Granulation Quality: Medium (34-66%) N/A N/A Necrotic Amount: Fat Layer (Subcutaneous Tissue): Yes N/A N/A Exposed Structures: Fascia: No Tendon: No Muscle: No Joint: No Bone: No Small (1-33%) N/A N/A Epithelialization: Treatment Notes Electronic Signature(s) Signed: 08/22/2022 4:47:18 PM By: Midge Aver MSN RN CNS WTA Entered By: Midge Aver on 08/22/2022 16:47:18 -------------------------------------------------------------------------------- Multi-Disciplinary Care Plan Details Patient Name: Date of Service: [name] Moody [name] 08/22/2022 4:00 PM Medical Record Number: [id number] Patient Account Number: [id number] Date of Birth/Sex: Treating RN: [birth year] (37 y.o. [name] Primary Care Guido Comp: [name] Other Clinician: Referring Rosea Dory: Treating Alitzel Cookson/Extender: RO BSO Dorris Carnes, MICHA EL Caleb Popp in Treatment: 18 Active Inactive Wound/Skin Impairment Nursing Diagnoses: Knowledge deficit related to ulceration/compromised skin integrity Goals: Patient/caregiver will verbalize understanding of skin care regimen Date Initiated: 04/18/2022 Target Resolution  Date: 07/18/2022 Goal Status: Active [name], [name] ([id number]) [id number] Page 5 of 7 Ulcer/skin breakdown will have a volume reduction of 30% by week 4 Date Initiated: 04/18/2022 Date Inactivated: 07/04/2022 Target Resolution Date: 05/19/2022 Goal Status: Unmet Unmet Reason: comorbidities Ulcer/skin breakdown will have a volume reduction of 50% by week 8 Date Initiated: 04/18/2022 Date Inactivated: 07/04/2022 Target Resolution Date: 06/18/2022 Goal Status: Unmet Unmet Reason: comorbidities Ulcer/skin breakdown will have a volume reduction of 80% by week 12 Date Initiated: 04/18/2022 Target Resolution Date: 07/18/2022 Goal Status: Active Ulcer/skin breakdown will heal within 14 weeks Date Initiated: 04/18/2022 Target Resolution Date: 08/17/2022 Goal Status: Active Interventions: Assess patient/caregiver ability to obtain necessary supplies Assess patient/caregiver ability to perform ulcer/skin care regimen upon admission and as needed Assess ulceration(s) every visit Notes: Electronic Signature(s) Signed: 08/22/2022 5:05:57 PM By: Midge Aver MSN RN CNS WTA Entered By: Midge Aver on 08/22/2022 16:36:36 -------------------------------------------------------------------------------- Pain Assessment Details Patient Name: Date of Service: [name] Moody [name] 08/22/2022 4:00 PM Medical Record Number: [id number] Patient Account Number: [id number] Date of Birth/Sex: Treating RN: 12/19/1985 (37 y.o. [name] Primary Care [place] Brogden: [name] Other Clinician: Referring Ravina Milner: Treating Jeanean Hollett/Extender: RO BSO Dorris Carnes, MICHA EL Caleb Popp in Treatment: 18 Active Problems Location of Pain Severity and Description of Pain Patient Has Paino No Site Locations Pain Management and Medication Current Pain Management: Electronic Signature(s) Signed: 08/22/2022 4:46:49 PM By: Midge Aver MSN RN CNS WTA Entered By: Midge Aver on 08/22/2022  16:46:49 -------------------------------------------------------------------------------- Patient/Caregiver Education Details Patient Name: Date of Service: [name] Moody [name] 5/8/2024andnbsp4:00 PM [name] ([id number]) [id number] Page 6 of 7 Medical Record Number: [id number] Patient Account Number: [id number] Date of Birth/Gender: Treating RN: 1986/03/18 (37 y.o. [name] Primary Care Physician: [name] Other Clinician: Referring Physician: Treating Physician/Extender: RO BSO Dorris Carnes, MICHA EL Caleb Popp in Treatment: 18 Education Assessment Education Provided To: Patient Education Topics Provided Wound/Skin Impairment: Handouts: Caring for Your Ulcer Methods: Explain/Verbal Responses: State content correctly Electronic Signature(s) Signed: 08/22/2022 5:05:57 PM By: Midge Aver MSN RN CNS WTA Entered By: Midge Aver on 08/22/2022 16:47:39 -------------------------------------------------------------------------------- Wound Assessment Details Patient Name: Date of Service: [name] Moody [name] 08/22/2022 4:00 PM Medical Record Number: [id number] Patient Account Number: [id number] Date of Birth/Sex: Treating RN: Oct 26, 1985 (37 y.o. [name] Primary Care Fayez Sturgell: [name] Other Clinician: Referring Darreon Lutes: Treating Tanashia Ciesla/Extender:  RO BSO N, MICHA EL Cristopher Estimable Weeks in Treatment: 18 Wound Status Wound Number: 5 Primary Etiology: Venous Leg Ulcer Wound Location: Left, Medial Lower Leg Wound Status: Open Wounding Event: Gradually Appeared Comorbid History: Hypertension, Peripheral Venous Disease Date Acquired: 04/10/2022 Weeks Of Treatment: 18 Clustered Wound: No Photos Wound Measurements Length: (cm) 1 Width: (cm) 3 Depth: (cm) 0.2 Area: (cm) 2.356 Volume: (cm) 0.471 % Reduction in Area: 57.1% % Reduction in Volume: 57.2% Epithelialization: Small (1-33%) Wound  Description Classification: Full Thickness Without Exposed Support Structures Exudate Amount: Medium Exudate Type: Serosanguineous Exudate Color: red, brown Foul Odor After Cleansing: No Slough/Fibrino Yes Wound Bed Granulation Amount: Medium (34-66%) Exposed [name], [name] ([id number]) [id number] Page 7 of 7 Granulation Quality: Red Fascia Exposed: No Necrotic Amount: Medium (34-66%) Fat Layer (Subcutaneous Tissue) Exposed: Yes Necrotic Quality: Adherent Slough Tendon Exposed: No Muscle Exposed: No Joint Exposed: No Bone Exposed: No Treatment Notes Wound #5 (Lower Leg) Wound Laterality: Left, Medial Cleanser Soap and Water Discharge Instruction: Gently cleanse wound with antibacterial soap, rinse and pat dry prior to dressing wounds Vashe 5.8 (oz) Discharge Instruction: Use vashe 5.8 (oz) as directed Peri-Wound Care AandD Ointment Discharge Instruction: Apply AandD Ointment as directed around edges to help dressing from sticking Topical keystone Primary Dressing Hydrofera Blue Ready Transfer Foam, 2.5x2.5 (in/in) Discharge Instruction: Apply Hydrofera Blue Ready to wound bed as directed cut to fit wound bed Secondary Dressing (BORDER) Zetuvit Plus SILICONE BORDER Dressing 4x4 (in/in) Discharge Instruction: Please do not put silicone bordered dressings under wraps. Use non-bordered dressing only. Secured With Compression Wrap Compression Stockings Facilities manager) Signed: 08/22/2022 5:05:57 PM By: Midge Aver MSN RN CNS WTA Entered By: Midge Aver on 08/22/2022 16:26:41 -------------------------------------------------------------------------------- Vitals Details Patient Name: Date of Service: [name] Moody [name] 08/22/2022 4:00 PM Medical Record Number: [id number] Patient Account Number: [id number] Date of Birth/Sex: Treating RN: [birth year] (37 y.o. [name] Primary Care [place]ila [place]: [name] Other  Clinician: Referring Aubriegh Minch: Treating Stuart Mirabile/Extender: RO BSO Dorris Carnes, MICHA EL Caleb Popp in Treatment: 18 Vital Signs Time Taken: 16:21 Temperature (F): 98.2 Height (in): 66 Pulse (bpm): 95 Weight (lbs): 400 Respiratory Rate (breaths/min): 16 Body Mass Index (BMI): 64.6 Blood Pressure (mmHg): 145/98 Reference Range: 80 - 120 mg / dl Electronic Signature(s) Signed: 08/22/2022 4:46:44 PM By: Midge Aver MSN RN CNS WTA Entered By: Midge Aver on 08/22/2022 16:46:44

## 2022-08-29 ENCOUNTER — Ambulatory Visit: Payer: BC Managed Care – PPO | Admitting: Internal Medicine

## 2022-09-05 ENCOUNTER — Ambulatory Visit: Payer: BC Managed Care – PPO | Admitting: Internal Medicine

## 2022-09-06 ENCOUNTER — Ambulatory Visit (INDEPENDENT_AMBULATORY_CARE_PROVIDER_SITE_OTHER): Payer: BC Managed Care – PPO | Admitting: Family Medicine

## 2022-09-06 ENCOUNTER — Encounter: Payer: Self-pay | Admitting: Family Medicine

## 2022-09-06 VITALS — BP 136/82 | HR 106 | Ht 66.0 in | Wt >= 6400 oz

## 2022-09-06 DIAGNOSIS — R0683 Snoring: Secondary | ICD-10-CM | POA: Diagnosis not present

## 2022-09-06 DIAGNOSIS — I83893 Varicose veins of bilateral lower extremities with other complications: Secondary | ICD-10-CM | POA: Diagnosis not present

## 2022-09-06 DIAGNOSIS — R252 Cramp and spasm: Secondary | ICD-10-CM | POA: Diagnosis not present

## 2022-09-06 DIAGNOSIS — J302 Other seasonal allergic rhinitis: Secondary | ICD-10-CM | POA: Diagnosis not present

## 2022-09-06 DIAGNOSIS — Z6841 Body Mass Index (BMI) 40.0 and over, adult: Secondary | ICD-10-CM

## 2022-09-06 MED ORDER — BACLOFEN 5 MG PO TABS: 5.0000 mg | ORAL_TABLET | Freq: Every evening | ORAL | 0 refills | Status: DC | PRN

## 2022-09-06 MED ORDER — CETIRIZINE HCL 10 MG PO TABS: 10.0000 mg | ORAL_TABLET | Freq: Every day | ORAL | 2 refills | Status: DC

## 2022-09-06 NOTE — Assessment & Plan Note (Addendum)
Discussed need for dedicated visit for weight loss at next visit. Patient having symptoms consistent with sequelae of obesity.

## 2022-09-06 NOTE — Assessment & Plan Note (Signed)
Associated fatigue and possible apnea at night. Discussed need for sleep study with patient.

## 2022-09-06 NOTE — Assessment & Plan Note (Signed)
Continue wound care, and compression stockings. Will follow-up Vascular appointment.

## 2022-09-06 NOTE — Patient Instructions (Addendum)
It was great to see you! Thank you for allowing me to participate in your care!  I recommend that you always bring your medications to each appointment as this makes it easy to ensure we are on the correct medications and helps [place] not miss when refills are needed.  Our plans for today:  - I have made a referral for your sleep study - Please make an appointment with me for 1 month upfront. - Please take 1 tablet of Zyrtec daily.  We are checking some labs today, I will call you if they are abnormal will send you a MyChart message or a letter if they are normal.  If you do not hear about your labs in the next 2 weeks please let us know.  Please arrive 15 minutes PRIOR to your next scheduled appointment time! If you do not, this affects OTHER patients' care.  Take care and seek immediate care sooner if you develop any concerns.   Dr. [name], MD [facility]

## 2022-09-06 NOTE — Progress Notes (Signed)
    SUBJECTIVE:   CHIEF COMPLAINT / HPI: here for physical and follow-up  Cough/Seasonal allergies - tried az  Leg swelling - See wound care regularly, is in process of scheduling appointment with vascular surgeon, wearing compression stocking at nights  Obesity - discussed need for dedicated visit, regarding weight loss for her ongoing health  Snoring - feels tired and fatigued during day, unsure about apneic episodes at night, but snoring annoys significant other  Cramping - Has had cramping for long time, seen here in December. Lab work ordered but not done. Patient states muscle spasms improved with muscle relaxant. Not exacerbated or brought on by exercise or movement. Patient states is random.  PERTINENT  PMH / PSH: Leg swelling, Obesity, Prediabetes  OBJECTIVE:   BP 136/82   Pulse (!) 106   Ht 5\' 6"  (1.676 m)   Wt (!) 188.3 kg   SpO2 98%   BMI 67.02 kg/m   General: NAD, morbidly obese Cardiovascular: RRR, no murmurs, 3+ peripheral edema, varicose veins, left leg bandaged Respiratory: normal WOB on RA, CTAB, no wheezes, ronchi or rales Abdomen: soft, NTTP, no rebound or guarding Extremities: Moving all 4 extremities equally   ASSESSMENT/PLAN:   Snoring Assessment & Plan: Associated fatigue and possible apnea at night. Discussed need for sleep study with patient.  Orders: -     Ambulatory referral to Neurology  Cramp and spasm Assessment & Plan: Ongoing for several years per chart review with normal labs. No recent electrolyte records will obtain BMP as well as Mag. Refilled Baclofen prescription. Follow-up 2 weeks.   Orders: -     Magnesium -     Basic metabolic panel -     Baclofen; Take 1 tablet (5 mg total) by mouth at bedtime as needed.  Dispense: 15 tablet; Refill: 0  Seasonal allergies -     Cetirizine HCl; Take 1 tablet (10 mg total) by mouth daily.  Dispense: 30 tablet; Refill: 2  Varicose veins of bilateral lower extremities with other  complications Assessment & Plan: Continue wound care, and compression stockings. Will follow-up Vascular appointment.   Class 3 severe obesity due to excess calories without serious comorbidity with body mass index (BMI) of 60.0 to 69.9 in adult [facility]) Assessment & Plan: Discussed need for dedicated visit for weight loss at next visit. Patient having symptoms consistent with sequelae of obesity.    Return in about 1 month (around 10/07/2022) for Nutrition.  [name], MD [facility]

## 2022-09-06 NOTE — Assessment & Plan Note (Signed)
Ongoing for several years per chart review with normal labs. No recent electrolyte records will obtain BMP as well as Mag. Refilled Baclofen prescription. Follow-up 2 weeks.

## 2022-09-07 LAB — BASIC METABOLIC PANEL
BUN/Creatinine Ratio: 17 (ref 9–23)
BUN: 13 mg/dL (ref 6–20)
CO2: 24 mmol/L (ref 20–29)
Calcium: 9 mg/dL (ref 8.7–10.2)
Chloride: 102 mmol/L (ref 96–106)
Creatinine, Ser: 0.77 mg/dL (ref 0.57–1.00)
Glucose: 95 mg/dL (ref 70–99)
Potassium: 4 mmol/L (ref 3.5–5.2)
Sodium: 140 mmol/L (ref 134–144)
eGFR: 102 mL/min/{1.73_m2} (ref >59)

## 2022-09-07 LAB — MAGNESIUM: Magnesium: 1.9 mg/dL (ref 1.6–2.3)

## 2022-09-15 ENCOUNTER — Other Ambulatory Visit: Payer: Self-pay | Admitting: Family Medicine

## 2022-09-15 DIAGNOSIS — R252 Cramp and spasm: Secondary | ICD-10-CM

## 2022-09-19 ENCOUNTER — Ambulatory Visit: Payer: BC Managed Care – PPO | Admitting: Internal Medicine

## 2022-09-21 ENCOUNTER — Ambulatory Visit: Payer: Self-pay | Admitting: Family Medicine

## 2022-09-21 NOTE — Progress Notes (Deleted)
    SUBJECTIVE:   CHIEF COMPLAINT / HPI: anemia follow-up  Hgb 7.4 last visit. Diet counseling with Dr. Brown. Here for further lab investigation. No new interim events. [name] is doing well. Has been taking Flintstone multivitamin.   PERTINENT  PMH / PSH: Anemia  OBJECTIVE:   BP (!) 111/62 Comment: on exam table  Pulse 98   Ht 3' 4.39" (1.026 m)   Wt 32 lb 2 oz (14.6 kg)   SpO2 99%   BMI 13.84 kg/m   General: NAD  HEENT: pale sclerae Cardiovascular: RRR, no murmurs, no peripheral edema Respiratory: normal WOB on RA, CTAB, no wheezes, ronchi or rales Abdomen: soft, NTTP, no rebound or guarding Extremities: Moving all 4 extremities equally   ASSESSMENT/PLAN:   Anemia, unspecified type Assessment & Plan: VSS, well appearing. Likely iron deficiency anemia. Will verify with ferritin and full CBC. Lead screening as well. If iron stores low will add iron supplementation. If not will schedule for lab visit for hgb electrophoresis and G6PD testing.  Orders: -     Ferritin -     CBC with Differential/Platelet  Need for lead screening -     Lead, Blood (Pediatric age 15 yrs or younger)  Elevated blood pressure reading Assessment & Plan: 96% percentile for age x2 in clinic. Will repeat at follow-up. Consider full primary versus secondary HTN work-up if still elevated at follow-up visit in 1 month.    Return in about 1 month (around 09/16/2022).  Ardenia Stiner, MD [facility]   

## 2022-10-03 ENCOUNTER — Encounter: Payer: BC Managed Care – PPO | Attending: Internal Medicine | Admitting: Internal Medicine

## 2022-10-03 DIAGNOSIS — L97822 Non-pressure chronic ulcer of other part of left lower leg with fat layer exposed: Secondary | ICD-10-CM | POA: Insufficient documentation

## 2022-10-03 DIAGNOSIS — I890 Lymphedema, not elsewhere classified: Secondary | ICD-10-CM | POA: Insufficient documentation

## 2022-10-03 DIAGNOSIS — I872 Venous insufficiency (chronic) (peripheral): Secondary | ICD-10-CM | POA: Diagnosis not present

## 2022-10-03 DIAGNOSIS — I10 Essential (primary) hypertension: Secondary | ICD-10-CM | POA: Diagnosis not present

## 2022-10-03 DIAGNOSIS — I87312 Chronic venous hypertension (idiopathic) with ulcer of left lower extremity: Secondary | ICD-10-CM | POA: Insufficient documentation

## 2022-10-08 NOTE — Progress Notes (Signed)
[name], [name] ([id number]) [id number] Page 1 of 12 Visit Report for 10/03/2022 Chief Complaint Document Details Patient Name: Date of Service: [name], [name] [place] J. 10/03/2022 3:30 PM Medical Record Number: [id number] Patient Account Number: [id number] Date of Birth/Sex: Treating RN: [birth year] (37 y.o. [name] Primary Care Provider: [name] Other Clinician: Referring Provider: Treating Provider/Extender: Emeline Darling in Treatment: 24 Information Obtained from: Patient Chief Complaint 04/18/2022; Left LE Ulcer Electronic Signature(s) Signed: 10/04/2022 12:49:28 PM By: Geralyn Corwin DO Entered By: Geralyn Corwin on 10/04/2022 12:40:57 -------------------------------------------------------------------------------- Debridement Details Patient Name: Date of Service: [name] Moody MA J. 10/03/2022 3:30 PM Medical Record Number: [id number] Patient Account Number: [id number] Date of Birth/Sex: Treating RN: 02-Oct-1985 (37 y.o. [name] Primary Care Provider: [name] Other Clinician: Referring Provider: Treating Provider/Extender: Emeline Darling in Treatment: 24 Debridement Performed for Assessment: Wound #5 Left,Medial Lower Leg Performed By: Physician Geralyn Corwin, MD Debridement Type: Debridement Severity of Tissue Pre Debridement: Fat layer exposed Level of Consciousness (Pre-procedure): Awake and Alert Pre-procedure Verification/Time Out Yes - 16:13 Taken: Start Time: 16:13 Percent of Wound Bed Debrided: 100% T Area Debrided (cm): otal 4.95 Tissue and other material debrided: Viable, Non-Viable, Slough, Slough Level: Non-Viable Tissue Debridement Description: Selective/Open Wound Instrument: Curette Bleeding: Minimum Hemostasis Achieved: Pressure Response to Treatment: Procedure was tolerated well Level of Consciousness (Post- Awake and Alert procedure): [name], [name] ([id number]) [id number] Page 2 of 12 Post Debridement Measurements of Total Wound Length: (cm) 1.8 Width: (cm) 3.5 Depth: (cm) 0.2 Volume: (cm) 0.99 Character of Wound/Ulcer Post Debridement: Stable Severity of Tissue Post Debridement: Fat layer exposed Post Procedure Diagnosis Same as Pre-procedure Electronic Signature(s) Signed: 10/04/2022 12:49:28 PM By: Geralyn Corwin DO Signed: 10/04/2022 5:12:55 PM By: [name] Signed: 10/08/2022 9:09:50 AM By: Elliot Gurney, [name], RN, CWS, [name], [name] Entered By: [name] on 10/03/2022 16:14:39 -------------------------------------------------------------------------------- HPI Details Patient Name: Date of Service: [name] Moody MA J. 10/03/2022 3:30 PM Medical Record Number: [id number] Patient Account Number: [id number] Date of Birth/Sex: Treating RN: [birth year] (37 y.o. [name] Primary Care Provider: [name] Other Clinician: Referring Provider: Treating Provider/Extender: Emeline Darling in Treatment: 24 History of Present Illness HPI Description: 37 year old patient who started with having ulcerations on the right lower leg on the lateral part of her ankle for about 2 weeks. She was seen in the ER at [facility] and advised to see the wound care for a consultation. No X-rays of workup was done during the ER visit and no prescription for any medications of compression wraps were given. the patient is not diabetic but does have hypertension and her medications have been reviewed by me. 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 The right and left greater saphenous vein was found to be tortuous. Deep venous system was also not competent and there was reflux of more than 500 ms. She was then seen by Dr. Karie Schwalbe  Early who recommended that the patient would not benefit from endovenous ablation and he had recommended vein stripping odd on the right side and multiple small phlebectomy procedures on the left side. the patient did not follow-up due to social economic reasons. She has not been wearing any compression stockings and has not taken any specific treatment for varicose veins for the last 3  years. 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 We have obtained some appointments to see the vascular surgeons by the end of the month and the patient would like to follow up with me at my [facility] on Wednesday, June 29. 10/14/2014 -- she could not see me yesterday in [place] and hence has come for a review today. She has a vascular workup to be done this afternoon at [facility]. She is doing fine otherwise. 10/22/2014 -- she was seen by Dr. Brantley Fling and he has recommended surgical removal of her right saphenous vein from distal thigh to saphenofemoral odd junction and stab phlebectomy's of multiple large tributary branches throughout her thigh and calf. This would be done under general anesthesia in the outpatient setting. 10/29/2014 -- she is trying to work on a surgical date and in the meanwhile we have got insurance clearance for Apligraf and we will start this next week. 7/22 2016 -- she is here for the first application of Apligraf. 11/19/2014 -- she is here for a second application of Apligraf 11/26/2014 -- she has done fine after her last application of Apligraf and is awaiting her surgery which is scheduled for August 31. 12/03/2014 -- she is doing fine and is here for her third application of Apligraf. 12/21/2014 -- She had surgery on 12/15/2014 by Dr.Early who did #1 ligation and stripping of right great saphenous vein from distal thigh to saphenofemoral junction, #2 stab phlebectomy of  large tributary varicose veins in the thigh popliteal space and calf. She had an Ace wrap up to her groin and this was removed today and the Unna's boot was also removed. 12/28/2014 -- she is here for her fourth application of Apligraf. 01/06/2015 - he saw her vascular surgeon Dr. Arbie Cookey who was pleased with her progress and he has confirmed that no surgical procedures could be attempted on the left side. 01/13/2015 -- her wound looks very good and she's been having no problems whatsoever. [name], [name] ([id number]) [id number] Page 3 of 12 Readmission: 07/26/2020 upon evaluation today patient presents for initial inspection here in our clinic for a new issue with her left leg although she is previously been seen due to issues with the right leg back in 2016. At that time she was seeing Dr. Arbie Cookey who is a vein/vascular specialist in [place]. He has since semiretired from what I understand. He is working out of [employer] I believe. Nonetheless she tells me at the time that there was really nothing to do for her left leg although the right leg was where they did most of the work. Subsequently she states that she is done fairly well until just in the past week where she had issues with bleeding from what appears to be varicose vein on the left leg medially. Unfortunately this has continued to be an issue although she tells me at first it was coming much more significantly Down quite a bit but nonetheless has not completely resolved. Every time she showers she notices that it starts to drain a little bit more. She does have a history of chronic venous insufficiency, lymphedema, varicose veins bilaterally, and obesity. 08/02/2020 upon evaluation today patient appears to be doing about the same in regards to the ulcer on her left leg. She has some eschar covering there is definitely some fluid collecting underneath unfortunately. With that being said she tells me she is  still  having a tremendous amount of pain therefore she is really not able to allow me to clean this off very effectively to be perfectly honest. I think we need to try to soften this up 08/16/2020 upon evaluation today patient's wound is really not doing significantly better not really states about the same. She notes that the wrap just does not seem to be staying up very well at all unfortunately. No fevers, chills, nausea, vomiting, or diarrhea. She did cut it off once it starts to slide in order to alleviate some of the pressure from sliding Down. Fortunately there is no signs of active infection at this time which is great news. 08/23/2020 upon evaluation today patient appears to be doing well 08/23/2020 upon evaluation today patient appears to be doing well with regard to her wound all things considered. Fortunately there does not appear to be any signs of active infection at this time which is great news. She has been tolerating the dressing changes without complication and overall I am extremely pleased with where things stand at this point. She does have her appointment with vascular in [facility] on June 9. 08/30/2020 upon evaluation today patient actually appears to be doing decently well in regard to her wound. Fortunately there is no signs of active infection which is great news. Nonetheless I do believe that the patient is going require little bit of debridement if she is okay with me attempting that today I think that will help clean off some of the necrotic tissue. Fortunately there does not appear to be otherwise any evidence of active infection which is also great news. 09/19/2020 upon evaluation today patient appears to be doing a little better in regard to her wound as compared to previous. Fortunately there does not appear to be any signs of active infection overall. No fever chills noted. I do believe that the Iodosorb has made this a little bit better with regard to the overall size  and appearance of the wound bed though again she does still have quite a ways to go to get this to heal she still very tender to touch. 09/27/2020 upon evaluation today patient appears to actually be doing quite well with regard to her wound. This is measuring much smaller which is great news. With that being said she did see vein and vascular in [facility] and they subsequently recommended that surgery is really what she probably needs to go forward with sounds like the potential for venous ablation. With that being said the patient tells me this is just not the right time for her to be able to proceed with any type of surgery which I completely understand. Nonetheless I do believe that she would continue to benefit from compression but again that is really not something that she is able to easily do. 10/04/2020 upon evaluation today patient appears to be doing about the same in regard to her wound. This is measuring a little bit smaller but nonetheless still is open and again has some slough and biofilm noted on the surface of the wound. There does not appear to be any signs of active infection which is great news. No fevers, chills, nausea, vomiting, or diarrhea. 10/04/2020 upon evaluation today patient appears to be doing well with regard to her wound. She has been tolerating dressing changes without complication. Fortunately there does not appear to be any signs of active infection which is great news. No fevers, chills, nausea, vomiting, or diarrhea. 10/25/2020 upon evaluation today patient appears to be  doing well with regard to her wound. She has been tolerating the dressing changes without complication. Fortunately there is no signs of active infection at this time. No fevers, chills, nausea, vomiting, or diarrhea. 11/01/2020 upon evaluation today patient with regard to her wound. She has been tolerating the dressing changes without complication. Fortunately there does not appear to be any signs  of infection which is great news. No fever chills noted 11/15/2020 upon evaluation today patient appears to be doing well with regard to her wound. Fortunately there is no signs of active infection at this time. No fevers, chills, nausea, vomiting, or diarrhea. With that being said she continues to have a significant amount of pain at the site even though this is very close to complete closure. She also had several varicose veins around the area which were also problematic. Overall however I feel like the patient is making excellent progress. 11/28/2020 upon evaluation today patient appears to be doing well with regard to her leg ulcer. Again were not really able to debride or compression wrap her due to discomfort and pain. She does not allow for that. With that being said we have been using Iodosorb which does seem to be doing decently well. Fortunately there is no signs of active infection at this time which is great news. No fevers, chills, nausea, vomiting, or diarrhea. 8/31; patient presents for 2-week follow-up. She has been using Iodosorb. She reports that the wound is closed. She denies signs of infection. Readmission: 10-27-2021 upon evaluation this is a patient that presents today whom I have previously seen this is pretty much for the same issue though I think a little bit higher than the last time I saw her. She does have a history of chronic venous insufficiency and hypertension along with varicose veins. Subsequently she does have an ulceration which spontaneously ruptured she has not been wearing any compression which I think is a big part of the issue here. We discussed this before I really think she probably needs to be wearing her compression therapy, she probably needs lymphedema pumps if she can ever wear the compression for a significant amount of time to get these, and subsequently also think that she needs to be elevating her legs is much as possible she may even need some vascular  intervention in regard to her veins. All of this was reiterated and discussed with her today to reinforce what needs to happen in order to ensure that her legs do not get a lot worse. The patient voiced understanding. She tells me that she knows because she is seeing her mom go through a lot of this as well how bad things can get. 11-03-2021 upon evaluation today patient appears to be doing well with regard to her wound. Fortunately there does not appear to be any signs of active infection at this time. She is measuring a little bit bigger but I think this is because the wound is actually cleaning up a bit here. 7/27; left lateral leg wound not any smaller but perhaps with a cleaner surface. She is using Iodoflex to help with the latter and using Tubigrip. She has chronic venous insufficiency with secondary lymphedema. She is apparently followed by vein and vascular and is being scheduled for an ablation 11-17-2021 upon evaluation today patient appears to be doing well with regard to her wound this is actually showing signs of improvement which is great news. Fortunately I do not see any evidence of active infection locally or systemically at  this time which is great news. No fevers, chills, nausea, vomiting, or diarrhea. 11-24-2021 upon evaluation today patient's wound is actually showing signs of significant improvement. Unfortunately she had quite a bit of pain with debridement last week I do believe it was beneficial but at the same time she is doing much better but still really does not want this debrided again I think being that it is looking a whole lot better I would try to avoid that today especially since it caused her so much discomfort that is really not the goal and I explained that to the patient today she voiced understanding and knows that it needed to be done but still states that it was quite painful. 11-30-2021 upon evaluation today patient appears to be doing excellent in regard to her  wound this is actually showing signs of excellent improvement I am very pleased with where things stand. She does have her venous ablation appointment for October 17. 12-21-2021 upon evaluation today patient appears to be doing better in regard to her wound this is measuring smaller and looking better as well. Fortunately I do not see any signs of active infection locally or systemically which is great news. [name], [name] ([id number]) [id number] Page 4 of 12 01-01-2022 upon evaluation today patient appears to be doing well currently in regard to her wound. She is showing signs of improvement which is great news and overall I do not see any signs of active infection locally or systemically at this time. 01-08-2022 upon evaluation today patient appears to be doing well currently in regard to her wound she is actually showing signs of significant improvement which is great news. Fortunately I do not see any evidence of active infection locally or systemically at this time. I do believe that we are on the right track. She also has her appointment October 19 for the venous ablation. 01-16-2022 upon evaluation today patient's wound actually is showing some signs of improvement although this is very slow. Fortunately I do not see any evidence of infection at this time. The volume is a little bit more although the size is smaller I think this is due to the fact that we are slowly cleaning this area out effectively. 11/6 continued improvement the patient is using Iodoflex and a Tubigrip E. She was supposed to have venous surgery at vein and vascular in [place] however somehow this is gotten delayed till December 14. 02-27-2022 upon evaluation today patient's wound is actually showing signs of being completely healed. Fortunately I do not see any evidence of active infection locally or systemically which is great news and overall I am extremely pleased with where we stand today.  Unfortunately she does tell me that she postpone her venous ablation surgery until December 14 she tells me that she was not ready "financially" for this. 04/18/2022; Ms. Annaliz Aven is a 37 year old female with a past medical history of venous insufficiency that presents the clinic for a left lower extremity wound. She was seen almost 2 months ago for the same wound. This had healed with Iodoflex and Tubigrip. She states that the wound recently reopened. She has seen vein and vascular and plan is for ligation and stripping of the left great saphenous vein. She is not sure when this procedure is going to be scheduled. She has canceled it once before. She has had office compression wraps in the past however these do not stay on and create more of an issue for her. She would like to avoid  this. She currently denies signs of infection. 1/10; patient presents for follow-up. She has been using Iodoflex to the wound bed. She states she has been using her Tubigrip however she does not have this on today. She has no issues or complaints today. She denies signs of infection. 1/17; patient presents for follow-up. She has not been using Iodoflex to the wound bed. She reports acute pain. She denies increased warmth, erythema or purulent drainage to the left lower extremity. She states she has been using Tubigrip. She has information to order the compression stockings but has not obtained them. 1/24; Patient had a wound culture done at last clinic visit that grew Proteus mirabilis and E. coli. She was prescribed levofloxacin and this should cover both bacteria. She has been taking the medication over the past week with improvement of symptoms to the wound bed. She has been using Hydrofera Blue under Tubigrip. She states the [facility] is sticking to her wound bed. 1/31; patient presents for follow-up. She has been using Medihoney to the wound bed with improvement in healing. She has been contacted by [facility]  to order her antibiotic ointment. She is not sure yet if she is able to afford this. She received her compression stockings but states they did not fit well. She is working on returning these. She has been using Tubigrip. 2/7; patient presents for follow-up. She is been using Medihoney to the wound bed. She obtained her compression stockings and has been using these daily. She states that the [facility] antibiotic ointment is arriving in the mail tomorrow. She has no issues or complaints today. She denies systemic signs of infection. 2/14; patient presents for follow-up. She has been using Keystone antibiotic ointment to the wound bed. She states the [facility] is sticking and she has stopped this. She is been using her compression stockings daily. She does not have these on today. 2/28; patient presents for follow-up. She continues to use [facility] antibiotic ointment to the wound bed. There is been improvement in healing. She has been using her compression stockings daily. 3/12; patient presents for follow-up. She has been using Keystone antibiotic ointment to the wound bed. She is not wearing her compression stockings today but states she does wear them daily. She declines debridement today. 3/20; patient presents for follow-up. She has been using Keystone antibiotic ointment with Hydrofera Blue. She states she is wearing her compression stockings daily but she does not have them on today. 3/27; patient presents for follow-up. She has been using Keystone antibiotic ointment and hydrofera blue to the wound bed. She reports wearing compression stockings. 4/10; patient presents for follow-up. She has been using Keystone antibiotic ointment and Hydrofera Blue to the wound bed. She reports wearing compression stockings. She has no issues or complaints today. 4/24; patient presents for follow-up. She has been using Keystone antibiotic ointment and Hydrofera Blue to the wound bed. Overall there is  healthier tissue today. She reports wearing her compression stockings daily. 5/8; patient comes into clinic today with outer stocking on the left leg and with no Keystone. She has small wounds on the medial left lower leg and has been using Keystone and [employer]	[place] at home. 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 She has chronic venous hypertension and stage III lymphedema. She came in today saying she did not want debridement and really was not interested in compression wraps saying they fall down consistently. She does not have compression pumps 6/20;  patient has missed her last 3 follow-up appointments. She has been using Keystone antibiotic ointment and Hydrofera Blue. We discussed potentially doing an in office wrap and patient declined Today. She states she will try the in office wrap next week. She denies signs of infection. Electronic Signature(s) Signed: 10/04/2022 12:49:28 PM By: Geralyn Corwin DO Entered By: Geralyn Corwin on 10/04/2022 12:41:17 Cenci, [name] ([id number]) [id number] Page 5 of 12 -------------------------------------------------------------------------------- Physical Exam Details Patient Name: Date of Service: [name], [name] [place] J. 10/03/2022 3:30 PM Medical Record Number: [id number] Patient Account Number: [id number] Date of Birth/Sex: Treating RN: 08/20/1985 (37 y.o. [name] Primary Care Provider: [name] Other Clinician: Referring Provider: Treating Provider/Extender: Emeline Darling in Treatment: 24 Constitutional . Cardiovascular . Psychiatric . Notes Left lower extremity: 2 open wounds with granulation tissue and nonviable tissue. Uncontrolled lower extremity edema with severe hemosiderin deposition. Varicosities noted. Electronic Signature(s) Signed: 10/04/2022 12:49:28 PM By: Geralyn Corwin DO Entered By: Geralyn Corwin on 10/04/2022  12:42:06 -------------------------------------------------------------------------------- Physician Orders Details Patient Name: Date of Service: [name] Moody MA J. 10/03/2022 3:30 PM Medical Record Number: [id number] Patient Account Number: [id number] Date of Birth/Sex: Treating RN: 08/07/1985 (37 y.o. [name] Primary Care Provider: [name] Other Clinician: Referring Provider: Treating Provider/Extender: Emeline Darling in Treatment: 24 Verbal / Phone Orders: Yes Clinician: [name] Read Back and Verified: Yes Diagnosis Coding Follow-up Appointments Wound #5 Left,Medial Lower Leg Return Appointment in 2 weeks. Bathing/ [employer] wounds with antibacterial soap and water. May shower with wound dressing protected with water repellent cover or cast protector. No tub bath. Edema Control - Lymphedema / Segmental Compressive Device / Other Wound #5 Left,Medial Lower Leg COZY, VEALE ([id number]) [id number] Page 6 of 12 Patient to wear own compression stockings. Remove compression stockings every night before going to bed and put on every morning when getting up. - LLE, Elevate, Exercise Daily and A void Standing for Long Periods of Time. Elevate legs to the level of the heart and pump ankles as often as possible Elevate leg(s) parallel to the floor when sitting. Medications-Please add to medication list. Wound #5 Left,Medial Lower Leg Keystone Compound Wound Treatment Wound #5 - Lower Leg Wound Laterality: Left, Medial Cleanser: Soap and Water 1 x Per Day/30 Days Discharge Instructions: Gently cleanse wound with antibacterial soap, rinse and pat dry prior to dressing wounds Cleanser: Vashe 5.8 (oz) 1 x Per Day/30 Days Discharge Instructions: Use vashe 5.8 (oz) as directed Peri-Wound Care: AandD Ointment 1 x Per Day/30 Days Discharge Instructions: Apply AandD Ointment as directed around edges to help  dressing from sticking Topical: keystone 1 x Per Day/30 Days Prim Dressing: Hydrofera Blue Ready Transfer Foam, 2.5x2.5 (in/in) 1 x Per Day/30 Days ary Discharge Instructions: Apply Hydrofera Blue Ready to wound bed as directed cut to fit wound bed Secondary Dressing: (BORDER) Zetuvit Plus SILICONE BORDER Dressing 4x4 (in/in) 1 x Per Day/30 Days Discharge Instructions: Please do not put silicone bordered dressings under wraps. Use non-bordered dressing only. Electronic Signature(s) Signed: 10/04/2022 12:49:28 PM By: Geralyn Corwin DO Entered By: Geralyn Corwin on 10/04/2022 12:46:30 -------------------------------------------------------------------------------- Problem List Details Patient Name: Date of Service: [name] Moody MA J. 10/03/2022 3:30 PM Medical Record Number: [id number] Patient Account Number: [id number] Date of Birth/Sex: Treating RN: 21-Mar-1986 (37 y.o. 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 10/03/2022 3:30 PM Medical Record Number: [id number] Patient Account Number: [id number] Date of Birth/Sex:  Treating RN: Mar 17, 1986 (37 y.o. [name] Primary Care Provider: [name] Other Clinician: Referring Provider: Treating Provider/Extender: Emeline Darling in Treatment: 24 Subjective Chief Complaint Information obtained from Patient 04/18/2022; Left LE Ulcer History of Present Illness (HPI) 37 year old patient who started with having ulcerations on the right lower leg on the lateral part of her ankle for about 2 weeks. She was seen in the ER at [facility] and advised to see the wound care for a consultation. No X-rays of workup was done during the ER visit and no prescription for any medications of compression wraps were given. the patient is not diabetic but does have hypertension and her medications have been reviewed by me. 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 The right and left greater saphenous vein was found to be tortuous. Deep venous system was also not competent and there was reflux of more than 500 ms. She was then seen by Dr. Karie Schwalbe Early who recommended that the patient would not benefit from endovenous ablation and he had recommended vein stripping odd on the right side and multiple small phlebectomy procedures on the left side. the patient did not follow-up due to social economic reasons. She has not been wearing any compression stockings and has not taken any specific treatment for varicose veins for the last 3 years. 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 We have obtained some appointments to see the vascular surgeons by the end of the month and the patient would like to follow up with me at my [facility] on Wednesday, June 29. 10/14/2014 -- she could not see me  yesterday in [place] and hence has come for a review today. She has a vascular workup to be done this afternoon at [facility]. She is doing fine otherwise. 10/22/2014 -- she was seen by Dr. Brantley Fling and he has recommended surgical removal of her right saphenous vein from distal thigh to saphenofemoral odd junction and stab phlebectomy's of multiple large tributary branches throughout her thigh and calf. This would be done under general anesthesia in the outpatient setting. 10/29/2014 -- she is trying to work on a surgical date and in the meanwhile we have got insurance clearance for Apligraf and we will start this next week. 7/22 2016 -- she is here for the first application of Apligraf. 11/19/2014 -- she is here for a second application of Apligraf 11/26/2014 -- she has done fine after her last application of Apligraf and is awaiting her surgery which is scheduled for August 31. 12/03/2014 -- she is doing fine and is here for her third application of Apligraf. 12/21/2014 -- She had surgery  on 12/15/2014 by Dr.Early who did #1 ligation and stripping of right great saphenous vein from distal thigh to saphenofemoral junction, #2 stab phlebectomy of large tributary varicose veins in the thigh popliteal space and calf. She had an Ace wrap up to her groin and this was removed today and the Unna's boot was also removed. 12/28/2014 -- she is here for her fourth application of Apligraf. 01/06/2015 - he saw her vascular surgeon Dr. Arbie Cookey who was pleased with her progress and he has confirmed that no surgical procedures could be attempted on the left side. 01/13/2015 -- her wound looks very good and she's been having no problems whatsoever. Readmission: 07/26/2020 upon evaluation today patient presents for initial inspection here in our clinic for a new issue with her left leg although she is previously been seen due to issues with the right leg back in 2016. At that time she was seeing Dr. Arbie Cookey who is a  vein/vascular specialist in [place]. He has since semiretired from what I understand. He is working out of [employer] I believe. Nonetheless she tells me at the time that there was really nothing to do for her left leg although the right leg was where they did most of the work. Subsequently she states that she is done fairly well until just in the past week where she had [name], [name] ([id number]) [id number] Page 8 of 12 issues with bleeding from what appears to be varicose vein on the left leg medially. Unfortunately this has continued to be an issue although she tells me at first it was coming much more significantly Down quite a bit but nonetheless has not completely resolved. Every time she showers she notices that it starts to drain a little bit more. She does have a history of chronic venous insufficiency, lymphedema, varicose veins bilaterally, and obesity. 08/02/2020 upon evaluation today patient appears to be doing about the same in regards to the ulcer on her left leg. She has some eschar covering there is definitely some fluid collecting underneath unfortunately. With that being said she tells me she is still having a tremendous amount of pain therefore she is really not able to allow me to clean this off very effectively to be perfectly honest. I think we need to try to soften this up 08/16/2020 upon evaluation today patient's wound is really not doing significantly better not really states about the same. She notes that the wrap just does not seem to be staying up very well at all unfortunately. No fevers, chills, nausea, vomiting, or diarrhea. She did cut it off once it starts to slide in order to alleviate some of the pressure from sliding Down. Fortunately there is no signs of active infection at this time which is great news. 08/23/2020 upon evaluation today patient appears to be doing well 08/23/2020 upon evaluation today patient appears to be doing well  with regard to her wound all things considered. Fortunately there does not appear to be any signs of active infection at this time which is great news. She has been tolerating the dressing changes without complication and overall I am extremely pleased with where things stand at this point. She does have her appointment with vascular in [facility] on June 9. 08/30/2020 upon evaluation today patient actually appears to be doing decently well in regard to her wound. Fortunately there is no signs of active infection which is great news. Nonetheless I do believe that the patient is going require little bit of debridement if she is  okay with me attempting that today I think that will help clean off some of the necrotic tissue. Fortunately there does not appear to be otherwise any evidence of active infection which is also great news. 09/19/2020 upon evaluation today patient appears to be doing a little better in regard to her wound as compared to previous. Fortunately there does not appear to be any signs of active infection overall. No fever chills noted. I do believe that the Iodosorb has made this a little bit better with regard to the overall size and appearance of the wound bed though again she does still have quite a ways to go to get this to heal she still very tender to touch. 09/27/2020 upon evaluation today patient appears to actually be doing quite well with regard to her wound. This is measuring much smaller which is great news. With that being said she did see vein and vascular in [facility] and they subsequently recommended that surgery is really what she probably needs to go forward with sounds like the potential for venous ablation. With that being said the patient tells me this is just not the right time for her to be able to proceed with any type of surgery which I completely understand. Nonetheless I do believe that she would continue to benefit from compression but again that is really not  something that she is able to easily do. 10/04/2020 upon evaluation today patient appears to be doing about the same in regard to her wound. This is measuring a little bit smaller but nonetheless still is open and again has some slough and biofilm noted on the surface of the wound. There does not appear to be any signs of active infection which is great news. No fevers, chills, nausea, vomiting, or diarrhea. 10/04/2020 upon evaluation today patient appears to be doing well with regard to her wound. She has been tolerating dressing changes without complication. Fortunately there does not appear to be any signs of active infection which is great news. No fevers, chills, nausea, vomiting, or diarrhea. 10/25/2020 upon evaluation today patient appears to be doing well with regard to her wound. She has been tolerating the dressing changes without complication. Fortunately there is no signs of active infection at this time. No fevers, chills, nausea, vomiting, or diarrhea. 11/01/2020 upon evaluation today patient with regard to her wound. She has been tolerating the dressing changes without complication. Fortunately there does not appear to be any signs of infection which is great news. No fever chills noted 11/15/2020 upon evaluation today patient appears to be doing well with regard to her wound. Fortunately there is no signs of active infection at this time. No fevers, chills, nausea, vomiting, or diarrhea. With that being said she continues to have a significant amount of pain at the site even though this is very close to complete closure. She also had several varicose veins around the area which were also problematic. Overall however I feel like the patient is making excellent progress. 11/28/2020 upon evaluation today patient appears to be doing well with regard to her leg ulcer. Again were not really able to debride or compression wrap her due to discomfort and pain. She does not allow for that. With that  being said we have been using Iodosorb which does seem to be doing decently well. Fortunately there is no signs of active infection at this time which is great news. No fevers, chills, nausea, vomiting, or diarrhea. 8/31; patient presents for 2-week follow-up. She has been  using Iodosorb. She reports that the wound is closed. She denies signs of infection. Readmission: 10-27-2021 upon evaluation this is a patient that presents today whom I have previously seen this is pretty much for the same issue though I think a little bit higher than the last time I saw her. She does have a history of chronic venous insufficiency and hypertension along with varicose veins. Subsequently she does have an ulceration which spontaneously ruptured she has not been wearing any compression which I think is a big part of the issue here. We discussed this before I really think she probably needs to be wearing her compression therapy, she probably needs lymphedema pumps if she can ever wear the compression for a significant amount of time to get these, and subsequently also think that she needs to be elevating her legs is much as possible she may even need some vascular intervention in regard to her veins. All of this was reiterated and discussed with her today to reinforce what needs to happen in order to ensure that her legs do not get a lot worse. The patient voiced understanding. She tells me that she knows because she is seeing her mom go through a lot of this as well how bad things can get. 11-03-2021 upon evaluation today patient appears to be doing well with regard to her wound. Fortunately there does not appear to be any signs of active infection at this time. She is measuring a little bit bigger but I think this is because the wound is actually cleaning up a bit here. 7/27; left lateral leg wound not any smaller but perhaps with a cleaner surface. She is using Iodoflex to help with the latter and using Tubigrip. She  has chronic venous insufficiency with secondary lymphedema. She is apparently followed by vein and vascular and is being scheduled for an ablation 11-17-2021 upon evaluation today patient appears to be doing well with regard to her wound this is actually showing signs of improvement which is great news. Fortunately I do not see any evidence of active infection locally or systemically at this time which is great news. No fevers, chills, nausea, vomiting, or diarrhea. 11-24-2021 upon evaluation today patient's wound is actually showing signs of significant improvement. Unfortunately she had quite a bit of pain with debridement last week I do believe it was beneficial but at the same time she is doing much better but still really does not want this debrided again I think being that it is looking a whole lot better I would try to avoid that today especially since it caused her so much discomfort that is really not the goal and I explained that to the patient today she voiced understanding and knows that it needed to be done but still states that it was quite painful. 11-30-2021 upon evaluation today patient appears to be doing excellent in regard to her wound this is actually showing signs of excellent improvement I am very pleased with where things stand. She does have her venous ablation appointment for October 17. 12-21-2021 upon evaluation today patient appears to be doing better in regard to her wound this is measuring smaller and looking better as well. Fortunately I do not see any signs of active infection locally or systemically which is great news. 01-01-2022 upon evaluation today patient appears to be doing well currently in regard to her wound. She is showing signs of improvement which is great news and overall I do not see any signs of active infection  locally or systemically at this time. 01-08-2022 upon evaluation today patient appears to be doing well currently in regard to her wound she is  actually showing signs of significant improvement which is great news. Fortunately I do not see any evidence of active infection locally or systemically at this time. I do believe that we are on the right track. [name], [name] ([id number]) [id number] Page 9 of 12 She also has her appointment October 19 for the venous ablation. 01-16-2022 upon evaluation today patient's wound actually is showing some signs of improvement although this is very slow. Fortunately I do not see any evidence of infection at this time. The volume is a little bit more although the size is smaller I think this is due to the fact that we are slowly cleaning this area out effectively. 11/6 continued improvement the patient is using Iodoflex and a Tubigrip E. She was supposed to have venous surgery at vein and vascular in [place] however somehow this is gotten delayed till December 14. 02-27-2022 upon evaluation today patient's wound is actually showing signs of being completely healed. Fortunately I do not see any evidence of active infection locally or systemically which is great news and overall I am extremely pleased with where we stand today. Unfortunately she does tell me that she postpone her venous ablation surgery until December 14 she tells me that she was not ready "financially" for this. 04/18/2022; [name] is a 37 year old female with a past medical history of venous insufficiency that presents the clinic for a left lower extremity wound. She was seen almost 2 months ago for the same wound. This had healed with Iodoflex and Tubigrip. She states that the wound recently reopened. She has seen vein and vascular and plan is for ligation and stripping of the left great saphenous vein. She is not sure when this procedure is going to be scheduled. She has canceled it once before. She has had office compression wraps in the past however these do not stay on and create more of an issue  for her. She would like to avoid this. She currently denies signs of infection. 1/10; patient presents for follow-up. She has been using Iodoflex to the wound bed. She states she has been using her Tubigrip however she does not have this on today. She has no issues or complaints today. She denies signs of infection. 1/17; patient presents for follow-up. She has not been using Iodoflex to the wound bed. She reports acute pain. She denies increased warmth, erythema or purulent drainage to the left lower extremity. She states she has been using Tubigrip. She has information to order the compression stockings but has not obtained them. 1/24; Patient had a wound culture done at last clinic visit that grew Proteus mirabilis and E. coli. She was prescribed levofloxacin and this should cover both bacteria. She has been taking the medication over the past week with improvement of symptoms to the wound bed. She has been using Hydrofera Blue under Tubigrip. She states the [facility] is sticking to her wound bed. 1/31; patient presents for follow-up. She has been using Medihoney to the wound bed with improvement in healing. She has been contacted by [facility] to order her antibiotic ointment. She is not sure yet if she is able to afford this. She received her compression stockings but states they did not fit well. She is working on returning these. She has been using Tubigrip. 2/7; patient presents for follow-up. She is been using Medihoney to  the wound bed. She obtained her compression stockings and has been using these daily. She states that the [facility] antibiotic ointment is arriving in the mail tomorrow. She has no issues or complaints today. She denies systemic signs of infection. 2/14; patient presents for follow-up. She has been using Keystone antibiotic ointment to the wound bed. She states the [facility] is sticking and she has stopped this. She is been using her compression stockings daily.  She does not have these on today. 2/28; patient presents for follow-up. She continues to use [facility] antibiotic ointment to the wound bed. There is been improvement in healing. She has been using her compression stockings daily. 3/12; patient presents for follow-up. She has been using Keystone antibiotic ointment to the wound bed. She is not wearing her compression stockings today but states she does wear them daily. She declines debridement today. 3/20; patient presents for follow-up. She has been using Keystone antibiotic ointment with Hydrofera Blue. She states she is wearing her compression stockings daily but she does not have them on today. 3/27; patient presents for follow-up. She has been using Keystone antibiotic ointment and hydrofera blue to the wound bed. She reports wearing compression stockings. 4/10; patient presents for follow-up. She has been using Keystone antibiotic ointment and Hydrofera Blue to the wound bed. She reports wearing compression stockings. She has no issues or complaints today. 4/24; patient presents for follow-up. She has been using Keystone antibiotic ointment and Hydrofera Blue to the wound bed. Overall there is healthier tissue today. She reports wearing her compression stockings daily. 5/8; patient comes into clinic today with outer stocking on the left leg and with no Keystone. She has small wounds on the medial left lower leg and has been using Keystone and [employer]	[place] at home. 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 She has chronic venous hypertension and stage III lymphedema. She came in today saying she did not want debridement and really was not interested in compression wraps saying they fall down consistently. She does not have compression pumps 6/20; patient has missed her last 3 follow-up appointments. She has been using Keystone antibiotic ointment and Hydrofera Blue. We discussed potentially doing an in office wrap and  patient declined Today. She states she will try the in office wrap next week. She denies signs of infection. Objective Constitutional Vitals Time Taken: 3:56 PM, Height: 66 in, Weight: 400 lbs, BMI: 64.6, Temperature: 98.3 F, Pulse: 97 bpm, Respiratory Rate: 18 breaths/min, Blood Pressure: 121/77 mmHg. General Notes: Left lower extremity: 2 open wounds with granulation tissue and nonviable tissue. Uncontrolled lower extremity edema with severe hemosiderin deposition. Varicosities noted. Integumentary (Hair, Skin) Wound #5 status is Open. Original cause of wound was Gradually Appeared. The date acquired was: 04/10/2022. The wound has been in treatment 24 weeks. The wound is located on the Left,Medial Lower Leg. The wound measures 1.8cm length x 3.4cm width x 0.2cm depth; 4.807cm^2 area and 0.961cm^3 volume. There is Fat Layer (Subcutaneous Tissue) exposed. There is a medium amount of serosanguineous drainage noted. There is medium (34-66%) red granulation within the wound bed. There is a medium (34-66%) amount of necrotic tissue within the wound bed including Adherent Slough. [name], [name] ([id number]) [id number] Page 10 of 12 Assessment Active Problems ICD-10 Chronic venous hypertension (idiopathic) with ulcer of left lower extremity Non-pressure chronic ulcer of other part of left lower leg with fat layer exposed Lymphedema, not elsewhere classified Wounds are stable. I debrided nonviable  tissue. Since her wounds have healed very slowly I did recommend trying an in office wrap as her edema is not controlled. She was not interested in this today. She states she will come back next week for this. Continue Hydrofera Blue and Keystone antibiotic ointment for now. Continue compression stockings daily. Procedures Wound #5 Pre-procedure diagnosis of Wound #5 is a Venous Leg Ulcer located on the Left,Medial Lower Leg .Severity of Tissue Pre Debridement is: Fat layer  exposed. There was a Selective/Open Wound Non-Viable Tissue Debridement with a total area of 4.95 sq cm performed by Geralyn Corwin, MD. With the following instrument(s): Curette to remove Viable and Non-Viable tissue/material. Material removed includes Slough. A time out was conducted at 16:13, prior to the start of the procedure. A Minimum amount of bleeding was controlled with Pressure. The procedure was tolerated well. Post Debridement Measurements: 1.8cm length x 3.5cm width x 0.2cm depth; 0.99cm^3 volume. Character of Wound/Ulcer Post Debridement is stable. Severity of Tissue Post Debridement is: Fat layer exposed. Post procedure Diagnosis Wound #5: Same as Pre-Procedure Plan Follow-up Appointments: Wound #5 Left,Medial Lower Leg: Return Appointment in 2 weeks. Bathing/ Shower/ Hygiene: Wash wounds with antibacterial soap and water. May shower with wound dressing protected with water repellent cover or cast protector. No tub bath. Edema Control - Lymphedema / Segmental Compressive Device / Other: Wound #5 Left,Medial Lower Leg: Patient to wear own compression stockings. Remove compression stockings every night before going to bed and put on every morning when getting up. - LLE, Elevate, Exercise Daily and Avoid Standing for Long Periods of Time. Elevate legs to the level of the heart and pump ankles as often as possible Elevate leg(s) parallel to the floor when sitting. Medications-Please add to medication list.: Wound #5 Left,Medial Lower Leg: Keystone Compound WOUND #5: - Lower Leg Wound Laterality: Left, Medial Cleanser: Soap and Water 1 x Per Day/30 Days Discharge Instructions: Gently cleanse wound with antibacterial soap, rinse and pat dry prior to dressing wounds Cleanser: Vashe 5.8 (oz) 1 x Per Day/30 Days Discharge Instructions: Use vashe 5.8 (oz) as directed Peri-Wound Care: AandD Ointment 1 x Per Day/30 Days Discharge Instructions: Apply AandD Ointment as directed  around edges to help dressing from sticking Topical: keystone 1 x Per Day/30 Days Prim Dressing: Hydrofera Blue Ready Transfer Foam, 2.5x2.5 (in/in) 1 x Per Day/30 Days ary Discharge Instructions: Apply Hydrofera Blue Ready to wound bed as directed cut to fit wound bed Secondary Dressing: (BORDER) Zetuvit Plus SILICONE BORDER Dressing 4x4 (in/in) 1 x Per Day/30 Days Discharge Instructions: Please do not put silicone bordered dressings under wraps. Use non-bordered dressing only. 1. In office sharp debridement 2. Antibiotic ointment with Hydrofera Blue 3. Compression stockings daily 4. Follow-up in 1 week Electronic Signature(s) Signed: 10/04/2022 12:49:28 PM By: Geralyn Corwin DO Entered By: Geralyn Corwin on 10/04/2022 12:46:05 Bum[place]ner, [name] ([id number]) [id number] Page 11 of 12 -------------------------------------------------------------------------------- ROS/PFSH Details Patient Name: Date of Service: [name], [name] [place] J. 10/03/2022 3:30 PM Medical Record Number: [id number] Patient Account Number: [id number] Date of Birth/Sex: Treating RN: 03-09-1986 (37 y.o. [name] Primary Care Provider: [name] Other Clinician: Referring Provider: Treating Provider/Extender: Emeline Darling in Treatment: 24 Information Obtained From Patient Cardiovascular Medical History: Positive for: Hypertension; Peripheral Venous Disease Endocrine Medical History: Past Medical History Notes: pre DM, A1C 5.5 12/21 Immunizations Pneumococcal Vaccine: Received Pneumococcal Vaccination: No Implantable Devices None Hospitalization / Surgery History Type of Hospitalization/Surgery umb hernia repair right lower leg vein surgery Family  and Social History Never smoker; Marital Status - Single; Alcohol Use: Rarely; Drug Use: Current History - marijuana; Caffeine Use: Rarely; Financial Concerns: No; Food, Clothing or Shelter Needs: No;  Support System Lacking: No; Transportation Concerns: No Electronic Signature(s) Signed: 10/04/2022 12:49:28 PM By: Geralyn Corwin DO Signed: 10/08/2022 9:09:50 AM By: Elliot Gurney, [name], RN, CWS, [name], [name] Entered By: Geralyn Corwin on 10/04/2022 12:46:39 -------------------------------------------------------------------------------- SuperBill Details Patient Name: Date of Service: [name] Moody MA J. 10/03/2022 Medical Record Number: [id number] Patient Account Number: [id number] CALY, PELLUM ([id number]) [id number] Page 12 of 12 Date of Birth/Sex: Treating RN: May 29, 1985 (37 y.o. Cathlean Cower, Kim Primary Care Provider: [name] Other Clinician: Referring Provider: Treating Provider/Extender: Emeline Darling in Treatment: 24 Diagnosis Coding ICD-10 Codes Code Description [phone] Chronic venous hypertension (idiopathic) with ulcer of left lower extremity L97.822 Non-pressure chronic ulcer of other part of left lower leg with fat layer exposed I89.0 Lymphedema, not elsewhere classified Facility Procedures : CPT4 Code: [id number] Description: [phone] - DEBRIDE WOUND 1ST 20 SQ CM OR < ICD-10 Diagnosis Description I87.312 Chronic venous hypertension (idiopathic) with ulcer of left lower extremity L97.822 Non-pressure chronic ulcer of other part of left lower leg with fat layer expose Modifier: d Quantity: 1 Physician Procedures : CPT4 Code Description Modifier [phone] [id number] - WC PHYS DEBR WO ANESTH 20 SQ CM ICD-10 Diagnosis Description I87.312 Chronic venous hypertension (idiopathic) with ulcer of left lower extremity L97.822 Non-pressure chronic ulcer of other part of left  lower leg with fat layer exposed Quantity: 1 Electronic Signature(s) Signed: 10/04/2022 12:49:28 PM By: Geralyn Corwin DO Entered By: Geralyn Corwin on 10/04/2022 12:46:16

## 2022-10-08 NOTE — Progress Notes (Signed)
SANJNA, HASKEW ([id number]) [id number] Page 1 of 9 Visit Report for 10/03/2022 Arrival Information Details Patient Name: Date of Service: [name], [name] [place] J. 10/03/2022 3:30 PM Medical Record Number: [id number] Patient Account Number: [id number] Date of Birth/Sex: Treating RN: [birth year] (37 y.o. [name] Primary Care Inger Wiest: [name] Other Clinician: Referring Rhiannan Kievit: Treating Brad Mcgaughy/Extender: Emeline Darling in Treatment: 24 Visit Information History Since Last Visit All ordered tests and consults were completed: No Patient Arrived: Ambulatory Added or deleted any medications: No Arrival Time: 15:52 Any new allergies or adverse reactions: No Transfer Assistance: None Had a fall or experienced change in No Patient Identification Verified: Yes activities of daily living that may affect Secondary Verification Process Completed: Yes risk of falls: Patient Requires Transmission-Based Precautions: No Signs or symptoms of abuse/neglect since last visito No Patient Has Alerts: No Hospitalized since last visit: No Implantable device outside of the clinic excluding No cellular tissue based products placed in the center since last visit: Has Dressing in Place as Prescribed: Yes Pain Present Now: Yes Electronic Signature(s) Signed: 10/04/2022 5:12:55 PM By: [name] Entered By: [name] on 10/03/2022 15:55:09 -------------------------------------------------------------------------------- Clinic Level of Care Assessment Details Patient Name: Date of Service: [name], [name] [place] J. 10/03/2022 3:30 PM Medical Record Number: [id number] Patient Account Number: [id number] Date of Birth/Sex: Treating RN: [birth year] (37 y.o. [name] Primary Care Fishel Wamble: [name] Other Clinician: Referring Jamira Barfuss: Treating Alysandra Lobue/Extender: Emeline Darling in Treatment: 24 Clinic Level of  Care Assessment Items TOOL 1 Quantity Score []  - 0 Use when EandM and Procedure is performed on INITIAL visit ASSESSMENTS - Nursing Assessment / Reassessment []  - 0 General Physical Exam (combine w/ comprehensive assessment (listed just below) when performed on new pt. evals) []  - 0 Comprehensive Assessment (HX, ROS, Risk Assessments, Wounds Hx, etc.) [name] ([id number]) [id number] Page 2 of 9 ASSESSMENTS - Wound and Skin Assessment / Reassessment []  - 0 Dermatologic / Skin Assessment (not related to wound area) ASSESSMENTS - Ostomy and/or Continence Assessment and Care []  - 0 Incontinence Assessment and Management []  - 0 Ostomy Care Assessment and Management (repouching, etc.) PROCESS - Coordination of Care []  - 0 Simple Patient / Family Education for ongoing care []  - 0 Complex (extensive) Patient / Family Education for ongoing care []  - 0 Staff obtains Chiropractor, Records, T Results / Process Orders est []  - 0 Staff telephones HHA, Nursing Homes / Clarify orders / etc []  - 0 Routine Transfer to another Facility (non-emergent condition) []  - 0 Routine Hospital Admission (non-emergent condition) []  - 0 New Admissions / Manufacturing engineer / Ordering NPWT Apligraf, etc. , []  - 0 Emergency Hospital Admission (emergent condition) PROCESS - Special Needs []  - 0 Pediatric / Minor Patient Management []  - 0 Isolation Patient Management []  - 0 Hearing / Language / Visual special needs []  - 0 Assessment of Community assistance (transportation, D/C planning, etc.) []  - 0 Additional assistance / Altered mentation []  - 0 Support Surface(s) Assessment (bed, cushion, seat, etc.) INTERVENTIONS - Miscellaneous []  - 0 External ear exam []  - 0 Patient Transfer (multiple staff / Nurse, adult / Similar devices) []  - 0 Simple Staple / Suture removal (25 or less) []  - 0 Complex Staple / Suture removal (26 or more) []  - 0 Hypo/Hyperglycemic  Management (do not check if billed separately) []  - 0 Ankle / Brachial Index (ABI) - do not check if billed separately Has the patient been seen at the hospital  within the last three years: Yes Total Score: 0 Level Of Care: ____ Electronic Signature(s) Signed: 10/04/2022 5:12:55 PM By: [name] Entered By: [name] on 10/03/2022 16:16:28 -------------------------------------------------------------------------------- Encounter Discharge Information Details Patient Name: Date of Service: [name] Moody MA J. 10/03/2022 3:30 PM Medical Record Number: [id number] Patient Account Number: [id number] Date of Birth/Sex: Treating RN: 07-Nov-1985 (37 y.o. [name] Primary Care Tyrika Newman: [name] Other Clinician: Referring Senai Ramnath: Treating Hewitt Garner/Extender: Zanae, Kuehnle, [name] ([id number]) [id number] Page 3 of 9 Weeks in Treatment: 24 Encounter Discharge Information Items Post Procedure Vitals Discharge Condition: Stable Temperature (F): 98.3 Ambulatory Status: Ambulatory Pulse (bpm): 97 Discharge Destination: Home Respiratory Rate (breaths/min): 18 Transportation: Private Auto Blood Pressure (mmHg): 121/77 Accompanied By: self Schedule Follow-up Appointment: Yes Clinical Summary of Care: Electronic Signature(s) Signed: 10/04/2022 5:12:55 PM By: [name] Entered By: [name] on 10/03/2022 16:34:14 -------------------------------------------------------------------------------- Lower Extremity Assessment Details Patient Name: Date of Service: [name], LANGSTAFF MA J. 10/03/2022 3:30 PM Medical Record Number: [id number] Patient Account Number: [id number] Date of Birth/Sex: Treating RN: 04-08-1986 (37 y.o. [name] Primary Care Alaiza Yau: [name] Other Clinician: Referring Shantoria Ellwood: Treating Zani Kyllonen/Extender: Emeline Darling in Treatment: 24 Edema  Assessment Assessed: [name]: Yes] [name]: No] Edema: [Left: Ye] [Right: s] Calf Left: Right: Point of Measurement: 32 cm From Medial Instep 58.5 cm Ankle Left: Right: Point of Measurement: 12 cm From Medial Instep 33 cm Vascular Assessment Pulses: Dorsalis Pedis Palpable: [Left:Yes] Electronic Signature(s) Signed: 10/04/2022 5:12:55 PM By: [name] Signed: 10/08/2022 9:09:50 AM By: Elliot Gurney, [name], RN, CWS, [name], [name] Entered By: [name] on 10/03/2022 16:06:42 Harkey, [name] ([id number]) [id number] Page 4 of 9 -------------------------------------------------------------------------------- Multi Wound Chart Details Patient Name: Date of Service: [name], [name] [place] J. 10/03/2022 3:30 PM Medical Record Number: [id number] Patient Account Number: [id number] Date of Birth/Sex: Treating RN: Sep 08, 1985 (37 y.o. [name] Primary Care Indiyah Paone: [name] Other Clinician: Referring Fletcher Ostermiller: Treating Annsleigh Dragoo/Extender: Emeline Darling in Treatment: 24 Vital Signs Height(in): 66 Pulse(bpm): 97 Weight(lbs): 400 Blood Pressure(mmHg): 121/77 Body Mass Index(BMI): 64.6 Temperature(F): 98.3 Respiratory Rate(breaths/min): 18 [5:Photos:] [N/A:N/A] Left, Medial Lower Leg N/A N/A Wound Location: Gradually Appeared N/A N/A Wounding Event: Venous Leg Ulcer N/A N/A Primary Etiology: Hypertension, Peripheral Venous N/A N/A Comorbid History: Disease 04/10/2022 N/A N/A Date Acquired: 24 N/A N/A Weeks of Treatment: Open N/A N/A Wound Status: No N/A N/A Wound Recurrence: 1.8x3.4x0.2 N/A N/A Measurements L x W x D (cm) 4.807 N/A N/A A (cm) : rea 0.961 N/A N/A Volume (cm) : 12.60% N/A N/A % Reduction in Area: 12.60% N/A N/A % Reduction in Volume: Full Thickness Without Exposed N/A N/A Classification: Support Structures Medium N/A N/A Exudate Amount: Serosanguineous N/A N/A Exudate Type: red, brown N/A  N/A Exudate Color: Medium (34-66%) N/A N/A Granulation Amount: Red N/A N/A Granulation Quality: Medium (34-66%) N/A N/A Necrotic Amount: Fat Layer (Subcutaneous Tissue): Yes N/A N/A Exposed Structures: Fascia: No Tendon: No Muscle: No Joint: No Bone: No Small (1-33%) N/A N/A Epithelialization: Treatment Notes Electronic Signature(s) Signed: 10/04/2022 5:12:55 PM By: [name] Entered By: [name] on 10/03/2022 16:06:51 Sotelo, [name] ([id number]) [id number] Page 5 of 9 -------------------------------------------------------------------------------- Multi-Disciplinary Care Plan Details Patient Name: Date of Service: [name], [name] [place] J. 10/03/2022 3:30 PM Medical Record Number: [id number] Patient Account Number: [id number] Date of Birth/Sex: Treating RN: 01-09-1986 (37 y.o. [name] Primary Care Pascal Stiggers: [name] Other Clinician: Referring Tamala Manzer: Treating Travius Crochet/Extender: Guido Sander  Weeks in Treatment: 24 Active Inactive Wound/Skin Impairment Nursing Diagnoses: Knowledge deficit related to ulceration/compromised skin integrity Goals: Patient/caregiver will verbalize understanding of skin care regimen Date Initiated: 04/18/2022 Target Resolution Date: 07/18/2022 Goal Status: Active Ulcer/skin breakdown will have a volume reduction of 30% by week 4 Date Initiated: 04/18/2022 Date Inactivated: 07/04/2022 Target Resolution Date: 05/19/2022 Goal Status: Unmet Unmet Reason: comorbidities Ulcer/skin breakdown will have a volume reduction of 50% by week 8 Date Initiated: 04/18/2022 Date Inactivated: 07/04/2022 Target Resolution Date: 06/18/2022 Goal Status: Unmet Unmet Reason: comorbidities Ulcer/skin breakdown will have a volume reduction of 80% by week 12 Date Initiated: 04/18/2022 Target Resolution Date: 07/18/2022 Goal Status: Active Ulcer/skin breakdown will heal within 14 weeks Date Initiated:  04/18/2022 Target Resolution Date: 08/17/2022 Goal Status: Active Interventions: Assess patient/caregiver ability to obtain necessary supplies Assess patient/caregiver ability to perform ulcer/skin care regimen upon admission and as needed Assess ulceration(s) every visit Notes: Electronic Signature(s) Signed: 10/04/2022 5:12:55 PM By: [name] Signed: 10/08/2022 9:09:50 AM By: Elliot Gurney, [name], RN, CWS, [name], [name] Entered By: [name] on 10/03/2022 16:17:18 -------------------------------------------------------------------------------- Pain Assessment Details Patient Name: Date of Service: [name] Moody MA J. 10/03/2022 3:30 PM [name] ([id number]) [id number] Page 6 of 9 Medical Record Number: [id number] Patient Account Number: [id number] Date of Birth/Sex: Treating RN: [birth year] (37 y.o. [name] Primary Care Mccall Lomax: [name] Other Clinician: Referring Roseanna Koplin: Treating Navaeh Kehres/Extender: Emeline Darling in Treatment: 24 Active Problems Location of Pain Severity and Description of Pain Patient Has Paino Yes Site Locations Pain Location: Pain in Ulcers Duration of the Pain. Constant / Intermittento Intermittent Rate the pain. Current Pain Level: 7 Character of Pain Describe the Pain: Burning, Stabbing, Throbbing Pain Management and Medication Current Pain Management: Medication: Yes Cold Application: No Rest: Yes Massage: No Activity: No T.E.N.S.: No Heat Application: No Leg drop or elevation: No Is the Current Pain Management Adequate: Inadequate How does your wound impact your activities of daily livingo Sleep: No Bathing: No Appetite: No Relationship With Others: No Bladder Continence: No Emotions: No Bowel Continence: No Work: No Toileting: No Drive: No Dressing: No Hobbies: No Electronic Signature(s) Signed: 10/04/2022 5:12:55 PM By: [name] Signed: 10/08/2022 9:09:50 AM  By: Elliot Gurney, [name], RN, CWS, [name], [name] Entered By: [name] on 10/03/2022 15:58:35 -------------------------------------------------------------------------------- Patient/Caregiver Education Details Patient Name: Date of Service: [name] Moody MA J. 6/19/2024andnbsp3:30 PM Medical Record Number: [id number] Patient Account Number: [id number] Date of Birth/Gender: Treating RN: Sep 14, 1985 (37 y.o. [name] Primary Care Physician: [name] Other Clinician: Referring Physician: Treating Physician/Extender: Emeline Darling in Treatment: [address] [place] J ([id number]) [id number] Page 7 of 9 Education Assessment Education Provided To: Patient Education Topics Provided Wound/Skin Impairment: Handouts: Other: continue wound care as directed Methods: Explain/Verbal Responses: State content correctly Electronic Signature(s) Signed: 10/04/2022 5:12:55 PM By: [name] Entered By: [name] on 10/03/2022 16:17:37 -------------------------------------------------------------------------------- Wound Assessment Details Patient Name: Date of Service: [name] Moody MA J. 10/03/2022 3:30 PM Medical Record Number: [id number] Patient Account Number: [id number] Date of Birth/Sex: Treating RN: 09-03-1985 (37 y.o. [name] Primary Care Symone Cornman: [name] Other Clinician: Referring Diann Bangerter: Treating Zhara Gieske/Extender: Emeline Darling in Treatment: 24 Wound Status Wound Number: 5 Primary Etiology: Venous Leg Ulcer Wound Location: Left, Medial Lower Leg Wound Status: Open Wounding Event: Gradually Appeared Comorbid History: Hypertension, Peripheral Venous Disease Date Acquired: 04/10/2022 Weeks Of Treatment: 24 Clustered Wound: No Photos Wound Measurements Length: (cm) 1.8 Width: (cm) 3.4  Depth: (cm) 0.2 Area: (cm) 4.807 Volume: (cm) 0.961 % Reduction in Area: 12.6% % Reduction  in Volume: 12.6% Epithelialization: Small (1-33%) Wound Description Classification: Full Thickness Without Exposed Support Exudate Amount: Medium [name], [name] ([id number]) Exudate Type: Serosanguineous Exudate Color: red, brown Structures Foul Odor After Cleansing: No Slough/Fibrino Yes [id number].pdf Page 8 of 9 Wound Bed Granulation Amount: Medium (34-66%) Exposed Structure Granulation Quality: Red Fascia Exposed: No Necrotic Amount: Medium (34-66%) Fat Layer (Subcutaneous Tissue) Exposed: Yes Necrotic Quality: Adherent Slough Tendon Exposed: No Muscle Exposed: No Joint Exposed: No Bone Exposed: No Treatment Notes Wound #5 (Lower Leg) Wound Laterality: Left, Medial Cleanser Soap and Water Discharge Instruction: Gently cleanse wound with antibacterial soap, rinse and pat dry prior to dressing wounds Vashe 5.8 (oz) Discharge Instruction: Use vashe 5.8 (oz) as directed Peri-Wound Care AandD Ointment Discharge Instruction: Apply AandD Ointment as directed around edges to help dressing from sticking Topical keystone Primary Dressing Hydrofera Blue Ready Transfer Foam, 2.5x2.5 (in/in) Discharge Instruction: Apply Hydrofera Blue Ready to wound bed as directed cut to fit wound bed Secondary Dressing (BORDER) Zetuvit Plus SILICONE BORDER Dressing 4x4 (in/in) Discharge Instruction: Please do not put silicone bordered dressings under wraps. Use non-bordered dressing only. Secured With Compression Wrap Compression Stockings Facilities manager) Signed: 10/04/2022 5:12:55 PM By: [name] Signed: 10/08/2022 9:09:50 AM By: Elliot Gurney, [name], RN, CWS, [name], [name] Entered By: [name] on 10/03/2022 16:05:45 -------------------------------------------------------------------------------- Vitals Details Patient Name: Date of Service: [name] Moody MA J. 10/03/2022 3:30 PM Medical Record Number: [id number] Patient Account Number: [id number] Date  of Birth/Sex: Treating RN: 08/14/1985 (37 y.o. [name] Primary Care Dontre Laduca: [name] Other Clinician: Referring Keylor Rands: Treating Soniya Ashraf/Extender: Emeline Darling in Treatment: 24 Vital Signs Time Taken: 15:56 Temperature (F): 98.3 [name], [name] ([id number]) [id number] Page 9 of 9 Height (in): 66 Pulse (bpm): 97 Weight (lbs): 400 Respiratory Rate (breaths/min): 18 Body Mass Index (BMI): 64.6 Blood Pressure (mmHg): 121/77 Reference Range: 80 - 120 mg / dl Electronic Signature(s) Signed: 10/04/2022 5:12:55 PM By: [name] Entered By: [name] on 10/03/2022 15:58:29

## 2022-10-17 ENCOUNTER — Encounter: Payer: BC Managed Care – PPO | Attending: Internal Medicine | Admitting: Internal Medicine

## 2022-10-17 DIAGNOSIS — L97822 Non-pressure chronic ulcer of other part of left lower leg with fat layer exposed: Secondary | ICD-10-CM

## 2022-10-17 DIAGNOSIS — I87312 Chronic venous hypertension (idiopathic) with ulcer of left lower extremity: Secondary | ICD-10-CM

## 2022-10-17 DIAGNOSIS — I872 Venous insufficiency (chronic) (peripheral): Secondary | ICD-10-CM | POA: Insufficient documentation

## 2022-10-17 DIAGNOSIS — I10 Essential (primary) hypertension: Secondary | ICD-10-CM | POA: Diagnosis present

## 2022-10-17 DIAGNOSIS — I890 Lymphedema, not elsewhere classified: Secondary | ICD-10-CM | POA: Insufficient documentation

## 2022-10-31 ENCOUNTER — Ambulatory Visit: Payer: BC Managed Care – PPO | Admitting: Internal Medicine

## 2022-11-06 NOTE — Progress Notes (Signed)
NAVIE, LAMOREAUX ([id number]) [id number] Page 1 of 9 Visit Report for 10/17/2022 Arrival Information Details Patient Name: Date of Service: [name], [name] [place]. 10/17/2022 3:30 PM Medical Record Number: [id number] Patient Account Number: [id number] Date of Birth/Sex: Treating RN: 1986/01/01 (37 y.o. [name] Primary Care Mekesha Solomon: [name] Other Clinician: [name] Referring Reyne Falconi: Treating Keitra Carusone/Extender: Emeline Darling in Treatment: 26 Visit Information History Since Last Visit All ordered tests and consults were completed: No Patient Arrived: Ambulatory Added or deleted any medications: No Arrival Time: 16:04 Any new allergies or adverse reactions: No Transfer Assistance: None Had a fall or experienced change in No Patient Identification Verified: Yes activities of daily living that may affect Secondary Verification Process Completed: Yes risk of falls: Patient Requires Transmission-Based Precautions: No Signs or symptoms of abuse/neglect since last visito No Patient Has Alerts: No Hospitalized since last visit: No Implantable device outside of the clinic excluding No cellular tissue based products placed in the center since last visit: Has Dressing in Place as Prescribed: Yes Pain Present Now: No Electronic Signature(s) Signed: 10/17/2022 4:57:17 PM By: [name] Entered By: [name] on 10/17/2022 16:10:33 -------------------------------------------------------------------------------- Clinic Level of Care Assessment Details Patient Name: Date of Service: [name], [name] [place] 10/17/2022 3:30 PM Medical Record Number: [id number] Patient Account Number: [id number] Date of Birth/Sex: Treating RN: Feb 02, 1986 (37 y.o. [name] Primary Care Willard Farquharson: [name] Other Clinician: [name] Referring Maecyn Panning: Treating Shantinique Picazo/Extender: Emeline Darling in  Treatment: 26 Clinic Level of Care Assessment Items TOOL 1 Quantity Score []  - 0 Use when EandM and Procedure is performed on INITIAL visit ASSESSMENTS - Nursing Assessment / Reassessment []  - 0 General Physical Exam (combine w/ comprehensive assessment (listed just below) when performed on new pt. evals) []  - 0 Comprehensive Assessment (HX, ROS, Risk Assessments, Wounds Hx, etc.) [name], [name] ([id number]) [id number] Page 2 of 9 ASSESSMENTS - Wound and Skin Assessment / Reassessment []  - 0 Dermatologic / Skin Assessment (not related to wound area) ASSESSMENTS - Ostomy and/or Continence Assessment and Care []  - 0 Incontinence Assessment and Management []  - 0 Ostomy Care Assessment and Management (repouching, etc.) PROCESS - Coordination of Care []  - 0 Simple Patient / Family Education for ongoing care []  - 0 Complex (extensive) Patient / Family Education for ongoing care []  - 0 Staff obtains Chiropractor, Records, T Results / Process Orders est []  - 0 Staff telephones HHA, Nursing Homes / Clarify orders / etc []  - 0 Routine Transfer to another Facility (non-emergent condition) []  - 0 Routine Hospital Admission (non-emergent condition) []  - 0 New Admissions / Manufacturing engineer / Ordering NPWT Apligraf, etc. , []  - 0 Emergency Hospital Admission (emergent condition) PROCESS - Special Needs []  - 0 Pediatric / Minor Patient Management []  - 0 Isolation Patient Management []  - 0 Hearing / Language / Visual special needs []  - 0 Assessment of Community assistance (transportation, D/C planning, etc.) []  - 0 Additional assistance / Altered mentation []  - 0 Support Surface(s) Assessment (bed, cushion, seat, etc.) INTERVENTIONS - Miscellaneous []  - 0 External ear exam []  - 0 Patient Transfer (multiple staff / Nurse, adult / Similar devices) []  - 0 Simple Staple / Suture removal (25 or less) []  - 0 Complex Staple / Suture removal (26 or  more) []  - 0 Hypo/Hyperglycemic Management (do not check if billed separately) []  - 0 Ankle / Brachial Index (ABI) - do not check if billed separately Has the patient been  seen at the hospital within the last three years: Yes Total Score: 0 Level Of Care: ____ Electronic Signature(s) Signed: 10/17/2022 4:57:17 PM By: [name] Entered By: [name] on 10/17/2022 16:26:03 -------------------------------------------------------------------------------- Encounter Discharge Information Details Patient Name: Date of Service: [name] [name] 10/17/2022 3:30 PM Medical Record Number: [id number] Patient Account Number: [id number] Date of Birth/Sex: Treating RN: 05-23-1985 (37 y.o. [name] Primary Care Nyonna Hargrove: [name] Other Clinician: [name] Referring Avian Greenawalt: Treating Ilina Xu/Extender: Jady, Braggs ([id number]) [id number] Page 3 of 9 Weeks in Treatment: 26 Encounter Discharge Information Items Post Procedure Vitals Discharge Condition: Stable Temperature (F): 98.3 Ambulatory Status: Ambulatory Pulse (bpm): 101 Discharge Destination: Home Respiratory Rate (breaths/min): 18 Transportation: Other Blood Pressure (mmHg): 135/85 Accompanied By: self Schedule Follow-up Appointment: Yes Clinical Summary of Care: Electronic Signature(s) Signed: 10/17/2022 4:57:17 PM By: [name] Entered By: [name] on 10/17/2022 16:39:47 -------------------------------------------------------------------------------- Lower Extremity Assessment Details Patient Name: Date of Service: [name], [name] [name] 10/17/2022 3:30 PM Medical Record Number: [id number] Patient Account Number: [id number] Date of Birth/Sex: Treating RN: Jul 16, 1985 (37 y.o. Cathlean Cower, Kim Primary Care Samika Vetsch: [name] Other Clinician: [name] Referring Gabriela Irigoyen: Treating Suhani Stillion/Extender: Emeline Darling in Treatment: 26 Edema Assessment Assessed: [name] Yes] [name]: No] Edema: [Left: Ye] [Right: s] Calf Left: Right: Point of Measurement: 32 cm From Medial Instep 57 cm Ankle Left: Right: Point of Measurement: 12 cm From Medial Instep 32 cm Vascular Assessment Pulses: Dorsalis Pedis Palpable: [Left:Yes] Electronic Signature(s) Signed: 10/17/2022 4:57:17 PM By: [name] Signed: 11/06/2022 8:06:11 AM By: Elliot Gurney, [name], RN, CWS, [name], [name] Entered By: [name] on 10/17/2022 16:18:16 Regula, [name] ([id number]) [id number] Page 4 of 9 -------------------------------------------------------------------------------- Multi Wound Chart Details Patient Name: Date of Service: HUBERT, DERSTINE [place]. 10/17/2022 3:30 PM Medical Record Number: [id number] Patient Account Number: [id number] Date of Birth/Sex: Treating RN: Aug 29, 1985 (37 y.o. [name] Primary Care Danniel Tones: [name] Other Clinician: [name] Referring Pantera Winterrowd: Treating Jinny Sweetland/Extender: Emeline Darling in Treatment: 26 Vital Signs Height(in): 66 Pulse(bpm): 101 Weight(lbs): 400 Blood Pressure(mmHg): 135/85 Body Mass Index(BMI): 64.6 Temperature(F): 98.3 Respiratory Rate(breaths/min): 18 [5:Photos:] [N/A:N/A] Left, Medial Lower Leg N/A N/A Wound Location: Gradually Appeared N/A N/A Wounding Event: Venous Leg Ulcer N/A N/A Primary Etiology: Hypertension, Peripheral Venous N/A N/A Comorbid History: Disease 04/10/2022 N/A N/A Date Acquired: 54 N/A N/A Weeks of Treatment: Open N/A N/A Wound Status: No N/A N/A Wound Recurrence: 1.5x3x0.2 N/A N/A Measurements L x W x D (cm) 3.534 N/A N/A A (cm) : rea 0.707 N/A N/A Volume (cm) : 35.70% N/A N/A % Reduction in Area: 35.70% N/A N/A % Reduction in Volume: Full Thickness Without Exposed N/A N/A Classification: Support Structures Medium N/A N/A Exudate  Amount: Serosanguineous N/A N/A Exudate Type: red, brown N/A N/A Exudate Color: Medium (34-66%) N/A N/A Granulation Amount: Red N/A N/A Granulation Quality: Medium (34-66%) N/A N/A Necrotic Amount: Fat Layer (Subcutaneous Tissue): Yes N/A N/A Exposed Structures: Fascia: No Tendon: No Muscle: No Joint: No Bone: No Small (1-33%) N/A N/A Epithelialization: Treatment Notes Electronic Signature(s) Signed: 10/17/2022 4:57:17 PM By: [name] Entered By: [name] on 10/17/2022 16:18:25 Malhotra, [name] ([id number]) [id number] Page 5 of 9 -------------------------------------------------------------------------------- Multi-Disciplinary Care Plan Details Patient Name: Date of Service: [name], [name] [place]. 10/17/2022 3:30 PM Medical Record Number: [id number] Patient Account Number: [id number] Date of Birth/Sex: Treating RN: [birth year] (37 y.o. [name] Primary Care Chuck Caban: [name] Other  Clinician: [name] Referring Ndia Sampath: Treating Dory Demont/Extender: Emeline Darling in Treatment: 26 Active Inactive Wound/Skin Impairment Nursing Diagnoses: Knowledge deficit related to ulceration/compromised skin integrity Goals: Patient/caregiver will verbalize understanding of skin care regimen Date Initiated: 04/18/2022 Target Resolution Date: 07/18/2022 Goal Status: Active Ulcer/skin breakdown will have a volume reduction of 30% by week 4 Date Initiated: 04/18/2022 Date Inactivated: 07/04/2022 Target Resolution Date: 05/19/2022 Goal Status: Unmet Unmet Reason: comorbidities Ulcer/skin breakdown will have a volume reduction of 50% by week 8 Date Initiated: 04/18/2022 Date Inactivated: 07/04/2022 Target Resolution Date: 06/18/2022 Goal Status: Unmet Unmet Reason: comorbidities Ulcer/skin breakdown will have a volume reduction of 80% by week 12 Date Initiated: 04/18/2022 Target Resolution Date: 07/18/2022 Goal Status:  Active Ulcer/skin breakdown will heal within 14 weeks Date Initiated: 04/18/2022 Target Resolution Date: 08/17/2022 Goal Status: Active Interventions: Assess patient/caregiver ability to obtain necessary supplies Assess patient/caregiver ability to perform ulcer/skin care regimen upon admission and as needed Assess ulceration(s) every visit Notes: Electronic Signature(s) Signed: 10/17/2022 4:57:17 PM By: [name] Signed: 11/06/2022 8:06:11 AM By: Elliot Gurney, [name], RN, CWS, [name], [name] Entered By: [name] on 10/17/2022 16:26:14 -------------------------------------------------------------------------------- Pain Assessment Details Patient Name: Date of Service: [name] [name] 10/17/2022 3:30 PM [name] ([id number]) [id number] Page 6 of 9 Medical Record Number: [id number] Patient Account Number: [id number] Date of Birth/Sex: Treating RN: 08/22/1985 (37 y.o. [name] Primary Care Irineo Gaulin: [name] Other Clinician: [name] Referring Addysin Porco: Treating Blanca Thornton/Extender: Emeline Darling in Treatment: 26 Active Problems Location of Pain Severity and Description of Pain Patient Has Paino No Site Locations Pain Management and Medication Current Pain Management: Electronic Signature(s) Signed: 10/17/2022 4:57:17 PM By: [name] Signed: 11/06/2022 8:06:11 AM By: Elliot Gurney, [name], RN, CWS, [name], [name] Entered By: [name] on 10/17/2022 16:12:44 -------------------------------------------------------------------------------- Patient/Caregiver Education Details Patient Name: Date of Service: [name] [name] [place] 7/3/2024andnbsp3:30 PM Medical Record Number: [id number] Patient Account Number: [id number] Date of Birth/Gender: Treating RN: 1985/10/26 (37 y.o. [name] Primary Care Physician: [name] Other Clinician: [name] Referring Physician: Treating Physician/Extender: Emeline Darling in Treatment: 26 Education Assessment Education Provided To: Patient Education Topics Provided Wound/Skin Impairment: Handouts: Other: continue wound care as directed Methods: Explain/Verbal Responses: State content correctly [name], [name] ([id number]) [phone].pdf Page 7 of 9 Electronic Signature(s) Signed: 10/17/2022 4:57:17 PM By: [name] Entered By: [name] on 10/17/2022 16:38:55 -------------------------------------------------------------------------------- Wound Assessment Details Patient Name: Date of Service: [name], PINARD [name] 10/17/2022 3:30 PM Medical Record Number: [id number] Patient Account Number: [id number] Date of Birth/Sex: Treating RN: 01-Feb-1986 (37 y.o. Cathlean Cower, Kim Primary Care Shellie Rogoff: [name] Other Clinician: [name] Referring Katherene Dinino: Treating Lakeena Downie/Extender: Emeline Darling in Treatment: 26 Wound Status Wound Number: 5 Primary Etiology: Venous Leg Ulcer Wound Location: Left, Medial Lower Leg Wound Status: Open Wounding Event: Gradually Appeared Comorbid History: Hypertension, Peripheral Venous Disease Date Acquired: 04/10/2022 Weeks Of Treatment: 26 Clustered Wound: No Photos Wound Measurements Length: (cm) 1.5 Width: (cm) 3 Depth: (cm) 0.2 Area: (cm) 3.534 Volume: (cm) 0.707 % Reduction in Area: 35.7% % Reduction in Volume: 35.7% Epithelialization: Small (1-33%) Wound Description Classification: Full Thickness Without Exposed Support Exudate Amount: Medium Exudate Type: Serosanguineous Exudate Color: red, brown Structures Foul Odor After Cleansing: No Slough/Fibrino Yes Wound Bed Granulation Amount: Medium (34-66%) Exposed Structure Granulation Quality: Red Fascia Exposed: No Necrotic Amount: Medium (34-66%) Fat Layer (Subcutaneous Tissue) Exposed: Yes Necrotic Quality: Adherent Slough Tendon Exposed:  No Muscle  Exposed: No Joint Exposed: No Bone Exposed: No Treatment Notes [name], [name] ([id number]) [id number] Page 8 of 9 Wound #5 (Lower Leg) Wound Laterality: Left, Medial Cleanser Byram Ancillary Kit - 15 Day Supply Discharge Instruction: Use supplies as instructed; Kit contains: (15) Saline Bullets; (15) 3x3 Gauze; 15 pr Gloves Soap and Water Discharge Instruction: Gently cleanse wound with antibacterial soap, rinse and pat dry prior to dressing wounds Vashe 5.8 (oz) Discharge Instruction: Use vashe 5.8 (oz) as directed Peri-Wound Care AandD Ointment Discharge Instruction: Apply AandD Ointment as directed around edges to help dressing from sticking Topical Gentamicin Discharge Instruction: Apply as directed by Renalda Locklin. Mupirocin Ointment Discharge Instruction: Apply as directed by Keyontae Huckeby. keystone Primary Dressing Hydrofera Blue Ready Transfer Foam, 2.5x2.5 (in/in) Discharge Instruction: Apply Hydrofera Blue Ready to wound bed as directed cut to fit wound bed Secondary Dressing (BORDER) Zetuvit Plus SILICONE BORDER Dressing 4x4 (in/in) Discharge Instruction: Please do not put silicone bordered dressings under wraps. Use non-bordered dressing only. Secured With Compression Wrap Compression Stockings Facilities manager) Signed: 10/17/2022 4:57:17 PM By: [name] Signed: 11/06/2022 8:06:11 AM By: Elliot Gurney, [name], RN, CWS, [name], [name] Entered By: [name] on 10/17/2022 16:17:16 -------------------------------------------------------------------------------- Vitals Details Patient Name: Date of Service: [name] [name] 10/17/2022 3:30 PM Medical Record Number: [id number] Patient Account Number: [id number] Date of Birth/Sex: Treating RN: [birth year] (37 y.o. Cathlean Cower, Kim Primary Care Ceola Para: [name] Other Clinician: [name] Referring Davarious Tumbleson: Treating Shelonda Saxe/Extender: Emeline Darling in  Treatment: 26 Vital Signs Time Taken: 16:10 Temperature (F): 98.3 Height (in): 66 Pulse (bpm): 101 Weight (lbs): 400 Respiratory Rate (breaths/min): 18 Body Mass Index (BMI): 64.6 Blood Pressure (mmHg): 135/85 Reference Range: 80 - 120 mg / dl Electronic Signature(s) TEMECA, SOMMA ([id number]) [id number] Page 9 of 9 Signed: 10/17/2022 4:57:17 PM By: [name] Entered By: [name] on 10/17/2022 16:12:40

## 2022-11-06 NOTE — Progress Notes (Signed)
[name], [name] ([id number]) [id number] Page 1 of 11 Visit Report for 10/17/2022 Chief Complaint Document Details Patient Name: Date of Service: [name] [place]. 10/17/2022 3:30 PM Medical Record Number: [id number] Patient Account Number: [id number] Date of Birth/Sex: Treating RN: 1985-10-07 (37 y.o. [name] Primary Care Provider: [name] Other Clinician: [name] Referring Provider: Treating Provider/Extender: Emeline Darling in Treatment: 26 Information Obtained from: Patient Chief Complaint 04/18/2022; Left LE Ulcer Electronic Signature(s) Signed: 10/17/2022 4:28:10 PM By: Geralyn Corwin DO Entered By: Geralyn Corwin on 10/17/2022 16:24:17 -------------------------------------------------------------------------------- Debridement Details Patient Name: Date of Service: [name] Moody MA J. 10/17/2022 3:30 PM Medical Record Number: [id number] Patient Account Number: [id number] Date of Birth/Sex: Treating RN: 1986-01-24 (37 y.o. Cathlean Cower, Kim Primary Care Provider: [name] Other Clinician: [name] Referring Provider: Treating Provider/Extender: Emeline Darling in Treatment: 26 Debridement Performed for Assessment: Wound #5 Left,Medial Lower Leg Performed By: Physician Geralyn Corwin, MD Debridement Type: Debridement Severity of Tissue Pre Debridement: Fat layer exposed Level of Consciousness (Pre-procedure): Awake and Alert Pre-procedure Verification/Time Out Yes - 16:22 Taken: Start Time: 16:22 Percent of Wound Bed Debrided: 100% T Area Debrided (cm): otal 3.53 Tissue and other material debrided: Viable, Non-Viable, Slough, Slough Level: Non-Viable Tissue Debridement Description: Selective/Open Wound Instrument: Curette Bleeding: Minimum Hemostasis Achieved: Pressure Response to Treatment: Procedure was tolerated well Level of Consciousness (Post- Awake and  Alert procedure): [name], [name] ([id number]) [id number] Page 2 of 11 Post Debridement Measurements of Total Wound Length: (cm) 1.5 Width: (cm) 3 Depth: (cm) 0.2 Volume: (cm) 0.707 Character of Wound/Ulcer Post Debridement: Improved Severity of Tissue Post Debridement: Fat layer exposed Post Procedure Diagnosis Same as Pre-procedure Electronic Signature(s) Signed: 10/17/2022 4:28:10 PM By: Geralyn Corwin DO Signed: 10/17/2022 4:57:17 PM By: [name] Signed: 11/06/2022 8:06:11 AM By: Elliot Gurney, [name], RN, CWS, [name], [name] Entered By: [name] on 10/17/2022 16:23:06 -------------------------------------------------------------------------------- HPI Details Patient Name: Date of Service: [name] Moody MA J. 10/17/2022 3:30 PM Medical Record Number: [id number] Patient Account Number: [id number] Date of Birth/Sex: Treating RN: 05-28-1985 (37 y.o. [name] Primary Care Provider: [name] Other Clinician: [name] Referring Provider: Treating Provider/Extender: Emeline Darling in Treatment: 26 History of Present Illness HPI Description: 37 year old patient who started with having ulcerations on the right lower leg on the lateral part of her ankle for about 2 weeks. She was seen in the ER at [facility] and advised to see the wound care for a consultation. No X-rays of workup was done during the ER visit and no prescription for any medications of compression wraps were given. the patient is not diabetic but does have hypertension and her medications have been reviewed by me. 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 The right and left greater saphenous vein was found to be tortuous. Deep venous system was also not competent and there was reflux of  more than 500 ms. She was then seen by Dr. Karie Schwalbe Early who recommended that the patient would not benefit from endovenous ablation and he had recommended vein stripping odd on the right side and multiple small phlebectomy procedures on the left side. the patient did not follow-up due to social economic reasons. She has not been wearing any compression stockings and has not taken any specific treatment for  varicose veins for the last 3 years. 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 We have obtained some appointments to see the vascular surgeons by the end of the month and the patient would like to follow up with me at my [facility] on Wednesday, June 29. 10/14/2014 -- she could not see me yesterday in [place] and hence has come for a review today. She has a vascular workup to be done this afternoon at [facility]. She is doing fine otherwise. 10/22/2014 -- she was seen by Dr. Brantley Fling and he has recommended surgical removal of her right saphenous vein from distal thigh to saphenofemoral odd junction and stab phlebectomy's of multiple large tributary branches throughout her thigh and calf. This would be done under general anesthesia in the outpatient setting. 10/29/2014 -- she is trying to work on a surgical date and in the meanwhile we have got insurance clearance for Apligraf and we will start this next week. 7/22 2016 -- she is here for the first application of Apligraf. 11/19/2014 -- she is here for a second application of Apligraf 11/26/2014 -- she has done fine after her last application of Apligraf and is awaiting her surgery which is scheduled for August 31. 12/03/2014 -- she is doing fine and is here for her third application of Apligraf. 12/21/2014 -- She had surgery on 12/15/2014 by Dr.Early who did #1 ligation and stripping of right great saphenous vein from distal thigh to  saphenofemoral junction, #2 stab phlebectomy of large tributary varicose veins in the thigh popliteal space and calf. She had an Ace wrap up to her groin and this was removed today and the Unna's boot was also removed. 12/28/2014 -- she is here for her fourth application of Apligraf. 01/06/2015 - he saw her vascular surgeon Dr. Arbie Cookey who was pleased with her progress and he has confirmed that no surgical procedures could be attempted on the left side. 01/13/2015 -- her wound looks very good and she's been having no problems whatsoever. [name], [name] ([id number]) [id number] Page 3 of 11 Readmission: 07/26/2020 upon evaluation today patient presents for initial inspection here in our clinic for a new issue with her left leg although she is previously been seen due to issues with the right leg back in 2016. At that time she was seeing Dr. Arbie Cookey who is a vein/vascular specialist in [place]. He has since semiretired from what I understand. He is working out of [employer] I believe. Nonetheless she tells me at the time that there was really nothing to do for her left leg although the right leg was where they did most of the work. Subsequently she states that she is done fairly well until just in the past week where she had issues with bleeding from what appears to be varicose vein on the left leg medially. Unfortunately this has continued to be an issue although she tells me at first it was coming much more significantly Down quite a bit but nonetheless has not completely resolved. Every time she showers she notices that it starts to drain a little bit more. She does have a history of chronic venous insufficiency, lymphedema, varicose veins bilaterally, and obesity. 08/02/2020 upon evaluation today patient appears to be doing about the same in regards to the ulcer on her left leg. She has some eschar covering there is definitely some fluid collecting underneath  unfortunately. With that being said  she tells me she is still having a tremendous amount of pain therefore she is really not able to allow me to clean this off very effectively to be perfectly honest. I think we need to try to soften this up 08/16/2020 upon evaluation today patient's wound is really not doing significantly better not really states about the same. She notes that the wrap just does not seem to be staying up very well at all unfortunately. No fevers, chills, nausea, vomiting, or diarrhea. She did cut it off once it starts to slide in order to alleviate some of the pressure from sliding Down. Fortunately there is no signs of active infection at this time which is great news. 08/23/2020 upon evaluation today patient appears to be doing well 08/23/2020 upon evaluation today patient appears to be doing well with regard to her wound all things considered. Fortunately there does not appear to be any signs of active infection at this time which is great news. She has been tolerating the dressing changes without complication and overall I am extremely pleased with where things stand at this point. She does have her appointment with vascular in [facility] on June 9. 08/30/2020 upon evaluation today patient actually appears to be doing decently well in regard to her wound. Fortunately there is no signs of active infection which is great news. Nonetheless I do believe that the patient is going require little bit of debridement if she is okay with me attempting that today I think that will help clean off some of the necrotic tissue. Fortunately there does not appear to be otherwise any evidence of active infection which is also great news. 09/19/2020 upon evaluation today patient appears to be doing a little better in regard to her wound as compared to previous. Fortunately there does not appear to be any signs of active infection overall. No fever chills noted. I do believe that the Iodosorb has made this a  little bit better with regard to the overall size and appearance of the wound bed though again she does still have quite a ways to go to get this to heal she still very tender to touch. 09/27/2020 upon evaluation today patient appears to actually be doing quite well with regard to her wound. This is measuring much smaller which is great news. With that being said she did see vein and vascular in [facility] and they subsequently recommended that surgery is really what she probably needs to go forward with sounds like the potential for venous ablation. With that being said the patient tells me this is just not the right time for her to be able to proceed with any type of surgery which I completely understand. Nonetheless I do believe that she would continue to benefit from compression but again that is really not something that she is able to easily do. 10/04/2020 upon evaluation today patient appears to be doing about the same in regard to her wound. This is measuring a little bit smaller but nonetheless still is open and again has some slough and biofilm noted on the surface of the wound. There does not appear to be any signs of active infection which is great news. No fevers, chills, nausea, vomiting, or diarrhea. 10/04/2020 upon evaluation today patient appears to be doing well with regard to her wound. She has been tolerating dressing changes without complication. Fortunately there does not appear to be any signs of active infection which is great news. No fevers, chills, nausea, vomiting, or diarrhea. 10/25/2020 upon  evaluation today patient appears to be doing well with regard to her wound. She has been tolerating the dressing changes without complication. Fortunately there is no signs of active infection at this time. No fevers, chills, nausea, vomiting, or diarrhea. 11/01/2020 upon evaluation today patient with regard to her wound. She has been tolerating the dressing changes without complication.  Fortunately there does not appear to be any signs of infection which is great news. No fever chills noted 11/15/2020 upon evaluation today patient appears to be doing well with regard to her wound. Fortunately there is no signs of active infection at this time. No fevers, chills, nausea, vomiting, or diarrhea. With that being said she continues to have a significant amount of pain at the site even though this is very close to complete closure. She also had several varicose veins around the area which were also problematic. Overall however I feel like the patient is making excellent progress. 11/28/2020 upon evaluation today patient appears to be doing well with regard to her leg ulcer. Again were not really able to debride or compression wrap her due to discomfort and pain. She does not allow for that. With that being said we have been using Iodosorb which does seem to be doing decently well. Fortunately there is no signs of active infection at this time which is great news. No fevers, chills, nausea, vomiting, or diarrhea. 8/31; patient presents for 2-week follow-up. She has been using Iodosorb. She reports that the wound is closed. She denies signs of infection. Readmission: 10-27-2021 upon evaluation this is a patient that presents today whom I have previously seen this is pretty much for the same issue though I think a little bit higher than the last time I saw her. She does have a history of chronic venous insufficiency and hypertension along with varicose veins. Subsequently she does have an ulceration which spontaneously ruptured she has not been wearing any compression which I think is a big part of the issue here. We discussed this before I really think she probably needs to be wearing her compression therapy, she probably needs lymphedema pumps if she can ever wear the compression for a significant amount of time to get these, and subsequently also think that she needs to be elevating her legs  is much as possible she may even need some vascular intervention in regard to her veins. All of this was reiterated and discussed with her today to reinforce what needs to happen in order to ensure that her legs do not get a lot worse. The patient voiced understanding. She tells me that she knows because she is seeing her mom go through a lot of this as well how bad things can get. 11-03-2021 upon evaluation today patient appears to be doing well with regard to her wound. Fortunately there does not appear to be any signs of active infection at this time. She is measuring a little bit bigger but I think this is because the wound is actually cleaning up a bit here. 7/27; left lateral leg wound not any smaller but perhaps with a cleaner surface. She is using Iodoflex to help with the latter and using Tubigrip. She has chronic venous insufficiency with secondary lymphedema. She is apparently followed by vein and vascular and is being scheduled for an ablation 11-17-2021 upon evaluation today patient appears to be doing well with regard to her wound this is actually showing signs of improvement which is great news. Fortunately I do not see any evidence of  active infection locally or systemically at this time which is great news. No fevers, chills, nausea, vomiting, or diarrhea. 11-24-2021 upon evaluation today patient's wound is actually showing signs of significant improvement. Unfortunately she had quite a bit of pain with debridement last week I do believe it was beneficial but at the same time she is doing much better but still really does not want this debrided again I think being that it is looking a whole lot better I would try to avoid that today especially since it caused her so much discomfort that is really not the goal and I explained that to the patient today she voiced understanding and knows that it needed to be done but still states that it was quite painful. 11-30-2021 upon evaluation today  patient appears to be doing excellent in regard to her wound this is actually showing signs of excellent improvement I am very pleased with where things stand. She does have her venous ablation appointment for October 17. 12-21-2021 upon evaluation today patient appears to be doing better in regard to her wound this is measuring smaller and looking better as well. Fortunately I do not see any signs of active infection locally or systemically which is great news. [name], [name] ([id number]) [id number] Page 4 of 11 01-01-2022 upon evaluation today patient appears to be doing well currently in regard to her wound. She is showing signs of improvement which is great news and overall I do not see any signs of active infection locally or systemically at this time. 01-08-2022 upon evaluation today patient appears to be doing well currently in regard to her wound she is actually showing signs of significant improvement which is great news. Fortunately I do not see any evidence of active infection locally or systemically at this time. I do believe that we are on the right track. She also has her appointment October 19 for the venous ablation. 01-16-2022 upon evaluation today patient's wound actually is showing some signs of improvement although this is very slow. Fortunately I do not see any evidence of infection at this time. The volume is a little bit more although the size is smaller I think this is due to the fact that we are slowly cleaning this area out effectively. 11/6 continued improvement the patient is using Iodoflex and a Tubigrip E. She was supposed to have venous surgery at vein and vascular in [place] however somehow this is gotten delayed till December 14. 02-27-2022 upon evaluation today patient's wound is actually showing signs of being completely healed. Fortunately I do not see any evidence of active infection locally or systemically which is great news and  overall I am extremely pleased with where we stand today. Unfortunately she does tell me that she postpone her venous ablation surgery until December 14 she tells me that she was not ready "financially" for this. 04/18/2022; Ms. Cionna Collantes is a 37 year old female with a past medical history of venous insufficiency that presents the clinic for a left lower extremity wound. She was seen almost 2 months ago for the same wound. This had healed with Iodoflex and Tubigrip. She states that the wound recently reopened. She has seen vein and vascular and plan is for ligation and stripping of the left great saphenous vein. She is not sure when this procedure is going to be scheduled. She has canceled it once before. She has had office compression wraps in the past however these do not stay on and create more of an issue for  her. She would like to avoid this. She currently denies signs of infection. 1/10; patient presents for follow-up. She has been using Iodoflex to the wound bed. She states she has been using her Tubigrip however she does not have this on today. She has no issues or complaints today. She denies signs of infection. 1/17; patient presents for follow-up. She has not been using Iodoflex to the wound bed. She reports acute pain. She denies increased warmth, erythema or purulent drainage to the left lower extremity. She states she has been using Tubigrip. She has information to order the compression stockings but has not obtained them. 1/24; Patient had a wound culture done at last clinic visit that grew Proteus mirabilis and E. coli. She was prescribed levofloxacin and this should cover both bacteria. She has been taking the medication over the past week with improvement of symptoms to the wound bed. She has been using Hydrofera Blue under Tubigrip. She states the [facility] is sticking to her wound bed. 1/31; patient presents for follow-up. She has been using Medihoney to the wound bed with  improvement in healing. She has been contacted by [facility] to order her antibiotic ointment. She is not sure yet if she is able to afford this. She received her compression stockings but states they did not fit well. She is working on returning these. She has been using Tubigrip. 2/7; patient presents for follow-up. She is been using Medihoney to the wound bed. She obtained her compression stockings and has been using these daily. She states that the [facility] antibiotic ointment is arriving in the mail tomorrow. She has no issues or complaints today. She denies systemic signs of infection. 2/14; patient presents for follow-up. She has been using Keystone antibiotic ointment to the wound bed. She states the [facility] is sticking and she has stopped this. She is been using her compression stockings daily. She does not have these on today. 2/28; patient presents for follow-up. She continues to use [facility] antibiotic ointment to the wound bed. There is been improvement in healing. She has been using her compression stockings daily. 3/12; patient presents for follow-up. She has been using Keystone antibiotic ointment to the wound bed. She is not wearing her compression stockings today but states she does wear them daily. She declines debridement today. 3/20; patient presents for follow-up. She has been using Keystone antibiotic ointment with Hydrofera Blue. She states she is wearing her compression stockings daily but she does not have them on today. 3/27; patient presents for follow-up. She has been using Keystone antibiotic ointment and hydrofera blue to the wound bed. She reports wearing compression stockings. 4/10; patient presents for follow-up. She has been using Keystone antibiotic ointment and Hydrofera Blue to the wound bed. She reports wearing compression stockings. She has no issues or complaints today. 4/24; patient presents for follow-up. She has been using Keystone antibiotic  ointment and Hydrofera Blue to the wound bed. Overall there is healthier tissue today. She reports wearing her compression stockings daily. 5/8; patient comes into clinic today with outer stocking on the left leg and with no Keystone. She has small wounds on the medial left lower leg and has been using Keystone and [employer]	[place] at home. 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 She has chronic venous hypertension and stage III lymphedema. She came in today saying she did not want debridement and really was not interested in compression wraps saying they fall down consistently. She  does not have compression pumps 6/20; patient has missed her last 3 follow-up appointments. She has been using Keystone antibiotic ointment and Hydrofera Blue. We discussed potentially doing an in office wrap and patient declined Today. She states she will try the in office wrap next week. She denies signs of infection. 7/3; patient presents for follow-up. She missed her last clinic appointment. She declines doing the in office wrap today. She has been using Hydrofera Blue with antibiotic ointment. Electronic Signature(s) Signed: 10/17/2022 4:28:10 PM By: Geralyn Corwin DO Entered By: Geralyn Corwin on 10/17/2022 16:24:49 Campos, [name] ([id number]) [id number] Page 5 of 11 -------------------------------------------------------------------------------- Physical Exam Details Patient Name: Date of Service: [name], [name] [place] 10/17/2022 3:30 PM Medical Record Number: [id number] Patient Account Number: [id number] Date of Birth/Sex: Treating RN: 26-Jun-1985 (37 y.o. [name] Primary Care Provider: [name] Other Clinician: [name] Referring Provider: Treating Provider/Extender: Emeline Darling in Treatment: 26 Constitutional . Cardiovascular . Psychiatric . Notes Left lower extremity: 2 open wounds with granulation tissue  and nonviable tissue. Uncontrolled lower extremity edema with Significant hemosiderin deposition. Varicosities noted. Electronic Signature(s) Signed: 10/17/2022 4:28:10 PM By: Geralyn Corwin DO Entered By: Geralyn Corwin on 10/17/2022 16:25:12 -------------------------------------------------------------------------------- Physician Orders Details Patient Name: Date of Service: [name] Moody MA J. 10/17/2022 3:30 PM Medical Record Number: [id number] Patient Account Number: [id number] Date of Birth/Sex: Treating RN: 1985/12/28 (37 y.o. [name] Primary Care Provider: [name] Other Clinician: [name] Referring Provider: Treating Provider/Extender: Emeline Darling in Treatment: 26 Verbal / Phone Orders: Yes Clinician: [name] Read Back and Verified: Yes Diagnosis Coding Follow-up Appointments Wound #5 Left,Medial Lower Leg Return Appointment in 2 weeks. Bathing/ [employer] wounds with antibacterial soap and water. May shower with wound dressing protected with water repellent cover or cast protector. No tub bath. Edema Control - Lymphedema / Segmental Compressive Device / Other Wound #5 Left,Medial Lower Leg RAHMA, MELLER ([id number]) [id number] Page 6 of 11 Patient to wear own compression stockings. Remove compression stockings every night before going to bed and put on every morning when getting up. - LLE, Elevate, Exercise Daily and A void Standing for Long Periods of Time. Elevate legs to the level of the heart and pump ankles as often as possible Elevate leg(s) parallel to the floor when sitting. Medications-Please add to medication list. Wound #5 Left,Medial Lower Leg Keystone Compound Wound Treatment Wound #5 - Lower Leg Wound Laterality: Left, Medial Cleanser: Byram Ancillary Kit - 15 Day Supply (DME) (Generic) 1 x Per Day/30 Days Discharge Instructions: Use supplies as instructed; Kit  contains: (15) Saline Bullets; (15) 3x3 Gauze; 15 pr Gloves Cleanser: Soap and Water 1 x Per Day/30 Days Discharge Instructions: Gently cleanse wound with antibacterial soap, rinse and pat dry prior to dressing wounds Cleanser: Vashe 5.8 (oz) 1 x Per Day/30 Days Discharge Instructions: Use vashe 5.8 (oz) as directed Peri-Wound Care: AandD Ointment 1 x Per Day/30 Days Discharge Instructions: Apply AandD Ointment as directed around edges to help dressing from sticking Topical: Gentamicin 1 x Per Day/30 Days Discharge Instructions: Apply as directed by provider. Topical: Mupirocin Ointment 1 x Per Day/30 Days Discharge Instructions: Apply as directed by provider. Topical: keystone 1 x Per Day/30 Days Prim Dressing: Hydrofera Blue Ready Transfer Foam, 2.5x2.5 (in/in) 1 x Per Day/30 Days ary Discharge Instructions: Apply Hydrofera Blue Ready to wound bed as directed cut to fit wound bed Secondary Dressing: (BORDER) Zetuvit Plus SILICONE BORDER Dressing 4x4 (in/in) (  DME) (Dispense As Written) 1 x Per Day/30 Days Discharge Instructions: Please do not put silicone bordered dressings under wraps. Use non-bordered dressing only. Electronic Signature(s) Signed: 10/17/2022 4:28:10 PM By: Geralyn Corwin DO Entered By: Geralyn Corwin on 10/17/2022 16:26:32 -------------------------------------------------------------------------------- Problem List Details Patient Name: Date of Service: [name] Moody MA J. 10/17/2022 3:30 PM Medical Record Number: [id number] Patient Account Number: [id number] Date of Birth/Sex: Treating RN: November 20, 1985 (37 y.o. 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 10/17/2022 3:30 PM Medical Record Number: [id number] Patient Account Number: [id number] Date of Birth/Sex: Treating RN: Apr 02, 1986 (37 y.o. [name] Primary Care Provider: [name] Other Clinician: [name] Referring Provider: Treating Provider/Extender: Emeline Darling in Treatment: 26 Subjective Chief Complaint Information obtained from Patient 04/18/2022; Left LE Ulcer History of Present Illness (HPI) 37 year old patient who started with having ulcerations on the right lower leg on the lateral part of her ankle for about 2 weeks. She was seen in the ER at [facility] and advised to see the wound care for a consultation. No X-rays of workup was done during the ER visit and no prescription for any medications of compression wraps were given. the patient is not diabetic but does have hypertension and her medications have been reviewed by me. 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 The right and left greater saphenous vein was found to be tortuous. Deep venous system was also not competent and there was reflux of more than 500 ms. She was then seen by Dr. Karie Schwalbe Early who recommended that the patient would not  benefit from endovenous ablation and he had recommended vein stripping odd on the right side and multiple small phlebectomy procedures on the left side. the patient did not follow-up due to social economic reasons. She has not been wearing any compression stockings and has not taken any specific treatment for varicose veins for the last 3 years. 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 We have obtained some appointments to see the vascular surgeons by the end of the month and the patient would like to follow up with me at my [facility] on Wednesday, June 29. 10/14/2014 -- she could not see me yesterday in [place] and hence has come for a review today. She has a vascular workup to be done this afternoon at [facility]. She is doing fine otherwise. 10/22/2014 -- she was seen by Dr. Brantley Fling and he has recommended surgical removal of her right saphenous vein from distal thigh to saphenofemoral odd junction and stab phlebectomy's of multiple large tributary branches throughout her thigh and  calf. This would be done under general anesthesia in the outpatient setting. 10/29/2014 -- she is trying to work on a surgical date and in the meanwhile we have got insurance clearance for Apligraf and we will start this next week. 7/22 2016 -- she is here for the first application of Apligraf. 11/19/2014 -- she is here for a second application of Apligraf 11/26/2014 -- she has done fine after her last application of Apligraf and is awaiting her surgery which is scheduled for August 31. 12/03/2014 -- she is doing fine and is here for her third application of Apligraf. 12/21/2014 -- She had surgery on 12/15/2014 by Dr.Early who did #1 ligation and stripping of right great saphenous vein from distal thigh to saphenofemoral junction, #2 stab phlebectomy of large tributary varicose veins in the thigh  popliteal space and calf. She had an Ace wrap up to her groin and this was removed today and the Unna's boot was also removed. 12/28/2014 -- she is here for her fourth application of Apligraf. [name], [name] ([id number]) [id number] Page 8 of 11 01/06/2015 - he saw her vascular surgeon Dr. Arbie Cookey who was pleased with her progress and he has confirmed that no surgical procedures could be attempted on the left side. 01/13/2015 -- her wound looks very good and she's been having no problems whatsoever. Readmission: 07/26/2020 upon evaluation today patient presents for initial inspection here in our clinic for a new issue with her left leg although she is previously been seen due to issues with the right leg back in 2016. At that time she was seeing Dr. Arbie Cookey who is a vein/vascular specialist in [place]. He has since semiretired from what I understand. He is working out of [employer] I believe. Nonetheless she tells me at the time that there was really nothing to do for her left leg although the right leg was where they did most of the work. Subsequently she states that she is done fairly well until just in the past week where she had issues with bleeding from what appears to be varicose vein on the left leg medially. Unfortunately this has continued to be an issue although she tells me at first it was coming much more significantly Down quite a bit but nonetheless has not completely resolved. Every time she showers she notices that it starts to drain a little bit more. She does have a history of chronic venous insufficiency, lymphedema, varicose veins bilaterally, and obesity. 08/02/2020 upon evaluation today patient appears to be doing about the same in regards to the ulcer on her left leg. She has some eschar covering there is definitely some fluid collecting underneath unfortunately. With that being said she tells me she is still having a tremendous amount of pain  therefore she is really not able to allow me to clean this off very effectively to be perfectly honest. I think we need to try to soften this up 08/16/2020 upon evaluation today patient's wound is really not doing significantly better not really states about the same. She notes that the wrap just does not seem to be staying up very well at all unfortunately. No fevers, chills, nausea, vomiting, or diarrhea. She did cut it off once it starts to slide in order to alleviate some of the pressure from sliding Down. Fortunately there is no signs of active infection at this time which is great news. 08/23/2020 upon evaluation today patient appears to be doing well 08/23/2020 upon evaluation today patient appears to be doing well  with regard to her wound all things considered. Fortunately there does not appear to be any signs of active infection at this time which is great news. She has been tolerating the dressing changes without complication and overall I am extremely pleased with where things stand at this point. She does have her appointment with vascular in [facility] on June 9. 08/30/2020 upon evaluation today patient actually appears to be doing decently well in regard to her wound. Fortunately there is no signs of active infection which is great news. Nonetheless I do believe that the patient is going require little bit of debridement if she is okay with me attempting that today I think that will help clean off some of the necrotic tissue. Fortunately there does not appear to be otherwise any evidence of active infection which is also great news. 09/19/2020 upon evaluation today patient appears to be doing a little better in regard to her wound as compared to previous. Fortunately there does not appear to be any signs of active infection overall. No fever chills noted. I do believe that the Iodosorb has made this a little bit better with regard to the overall size and appearance of the wound bed though again  she does still have quite a ways to go to get this to heal she still very tender to touch. 09/27/2020 upon evaluation today patient appears to actually be doing quite well with regard to her wound. This is measuring much smaller which is great news. With that being said she did see vein and vascular in [facility] and they subsequently recommended that surgery is really what she probably needs to go forward with sounds like the potential for venous ablation. With that being said the patient tells me this is just not the right time for her to be able to proceed with any type of surgery which I completely understand. Nonetheless I do believe that she would continue to benefit from compression but again that is really not something that she is able to easily do. 10/04/2020 upon evaluation today patient appears to be doing about the same in regard to her wound. This is measuring a little bit smaller but nonetheless still is open and again has some slough and biofilm noted on the surface of the wound. There does not appear to be any signs of active infection which is great news. No fevers, chills, nausea, vomiting, or diarrhea. 10/04/2020 upon evaluation today patient appears to be doing well with regard to her wound. She has been tolerating dressing changes without complication. Fortunately there does not appear to be any signs of active infection which is great news. No fevers, chills, nausea, vomiting, or diarrhea. 10/25/2020 upon evaluation today patient appears to be doing well with regard to her wound. She has been tolerating the dressing changes without complication. Fortunately there is no signs of active infection at this time. No fevers, chills, nausea, vomiting, or diarrhea. 11/01/2020 upon evaluation today patient with regard to her wound. She has been tolerating the dressing changes without complication. Fortunately there does not appear to be any signs of infection which is great news. No fever  chills noted 11/15/2020 upon evaluation today patient appears to be doing well with regard to her wound. Fortunately there is no signs of active infection at this time. No fevers, chills, nausea, vomiting, or diarrhea. With that being said she continues to have a significant amount of pain at the site even though this is very close to complete closure. She also had  several varicose veins around the area which were also problematic. Overall however I feel like the patient is making excellent progress. 11/28/2020 upon evaluation today patient appears to be doing well with regard to her leg ulcer. Again were not really able to debride or compression wrap her due to discomfort and pain. She does not allow for that. With that being said we have been using Iodosorb which does seem to be doing decently well. Fortunately there is no signs of active infection at this time which is great news. No fevers, chills, nausea, vomiting, or diarrhea. 8/31; patient presents for 2-week follow-up. She has been using Iodosorb. She reports that the wound is closed. She denies signs of infection. Readmission: 10-27-2021 upon evaluation this is a patient that presents today whom I have previously seen this is pretty much for the same issue though I think a little bit higher than the last time I saw her. She does have a history of chronic venous insufficiency and hypertension along with varicose veins. Subsequently she does have an ulceration which spontaneously ruptured she has not been wearing any compression which I think is a big part of the issue here. We discussed this before I really think she probably needs to be wearing her compression therapy, she probably needs lymphedema pumps if she can ever wear the compression for a significant amount of time to get these, and subsequently also think that she needs to be elevating her legs is much as possible she may even need some vascular intervention in regard to her veins. All  of this was reiterated and discussed with her today to reinforce what needs to happen in order to ensure that her legs do not get a lot worse. The patient voiced understanding. She tells me that she knows because she is seeing her mom go through a lot of this as well how bad things can get. 11-03-2021 upon evaluation today patient appears to be doing well with regard to her wound. Fortunately there does not appear to be any signs of active infection at this time. She is measuring a little bit bigger but I think this is because the wound is actually cleaning up a bit here. 7/27; left lateral leg wound not any smaller but perhaps with a cleaner surface. She is using Iodoflex to help with the latter and using Tubigrip. She has chronic venous insufficiency with secondary lymphedema. She is apparently followed by vein and vascular and is being scheduled for an ablation 11-17-2021 upon evaluation today patient appears to be doing well with regard to her wound this is actually showing signs of improvement which is great news. Fortunately I do not see any evidence of active infection locally or systemically at this time which is great news. No fevers, chills, nausea, vomiting, or diarrhea. 11-24-2021 upon evaluation today patient's wound is actually showing signs of significant improvement. Unfortunately she had quite a bit of pain with debridement last week I do believe it was beneficial but at the same time she is doing much better but still really does not want this debrided again I think being that it is looking a whole lot better I would try to avoid that today especially since it caused her so much discomfort that is really not the goal and I explained that to the patient today she voiced understanding and knows that it needed to be done but still states that it was quite painful. [name], [name] ([id number]) [id number] Page 9 of 11 11-30-2021 upon evaluation  today patient  appears to be doing excellent in regard to her wound this is actually showing signs of excellent improvement I am very pleased with where things stand. She does have her venous ablation appointment for October 17. 12-21-2021 upon evaluation today patient appears to be doing better in regard to her wound this is measuring smaller and looking better as well. Fortunately I do not see any signs of active infection locally or systemically which is great news. 01-01-2022 upon evaluation today patient appears to be doing well currently in regard to her wound. She is showing signs of improvement which is great news and overall I do not see any signs of active infection locally or systemically at this time. 01-08-2022 upon evaluation today patient appears to be doing well currently in regard to her wound she is actually showing signs of significant improvement which is great news. Fortunately I do not see any evidence of active infection locally or systemically at this time. I do believe that we are on the right track. She also has her appointment October 19 for the venous ablation. 01-16-2022 upon evaluation today patient's wound actually is showing some signs of improvement although this is very slow. Fortunately I do not see any evidence of infection at this time. The volume is a little bit more although the size is smaller I think this is due to the fact that we are slowly cleaning this area out effectively. 11/6 continued improvement the patient is using Iodoflex and a Tubigrip E. She was supposed to have venous surgery at vein and vascular in [place] however somehow this is gotten delayed till December 14. 02-27-2022 upon evaluation today patient's wound is actually showing signs of being completely healed. Fortunately I do not see any evidence of active infection locally or systemically which is great news and overall I am extremely pleased with where we stand today. Unfortunately she does tell me that  she postpone her venous ablation surgery until December 14 she tells me that she was not ready "financially" for this. 04/18/2022; Ms. Cheron Coryell is a 37 year old female with a past medical history of venous insufficiency that presents the clinic for a left lower extremity wound. She was seen almost 2 months ago for the same wound. This had healed with Iodoflex and Tubigrip. She states that the wound recently reopened. She has seen vein and vascular and plan is for ligation and stripping of the left great saphenous vein. She is not sure when this procedure is going to be scheduled. She has canceled it once before. She has had office compression wraps in the past however these do not stay on and create more of an issue for her. She would like to avoid this. She currently denies signs of infection. 1/10; patient presents for follow-up. She has been using Iodoflex to the wound bed. She states she has been using her Tubigrip however she does not have this on today. She has no issues or complaints today. She denies signs of infection. 1/17; patient presents for follow-up. She has not been using Iodoflex to the wound bed. She reports acute pain. She denies increased warmth, erythema or purulent drainage to the left lower extremity. She states she has been using Tubigrip. She has information to order the compression stockings but has not obtained them. 1/24; Patient had a wound culture done at last clinic visit that grew Proteus mirabilis and E. coli. She was prescribed levofloxacin and this should cover both bacteria. She has been taking the  medication over the past week with improvement of symptoms to the wound bed. She has been using Hydrofera Blue under Tubigrip. She states the [facility] is sticking to her wound bed. 1/31; patient presents for follow-up. She has been using Medihoney to the wound bed with improvement in healing. She has been contacted by [facility] to order her antibiotic ointment.  She is not sure yet if she is able to afford this. She received her compression stockings but states they did not fit well. She is working on returning these. She has been using Tubigrip. 2/7; patient presents for follow-up. She is been using Medihoney to the wound bed. She obtained her compression stockings and has been using these daily. She states that the [facility] antibiotic ointment is arriving in the mail tomorrow. She has no issues or complaints today. She denies systemic signs of infection. 2/14; patient presents for follow-up. She has been using Keystone antibiotic ointment to the wound bed. She states the [facility] is sticking and she has stopped this. She is been using her compression stockings daily. She does not have these on today. 2/28; patient presents for follow-up. She continues to use [facility] antibiotic ointment to the wound bed. There is been improvement in healing. She has been using her compression stockings daily. 3/12; patient presents for follow-up. She has been using Keystone antibiotic ointment to the wound bed. She is not wearing her compression stockings today but states she does wear them daily. She declines debridement today. 3/20; patient presents for follow-up. She has been using Keystone antibiotic ointment with Hydrofera Blue. She states she is wearing her compression stockings daily but she does not have them on today. 3/27; patient presents for follow-up. She has been using Keystone antibiotic ointment and hydrofera blue to the wound bed. She reports wearing compression stockings. 4/10; patient presents for follow-up. She has been using Keystone antibiotic ointment and Hydrofera Blue to the wound bed. She reports wearing compression stockings. She has no issues or complaints today. 4/24; patient presents for follow-up. She has been using Keystone antibiotic ointment and Hydrofera Blue to the wound bed. Overall there is healthier tissue today. She reports  wearing her compression stockings daily. 5/8; patient comes into clinic today with outer stocking on the left leg and with no Keystone. She has small wounds on the medial left lower leg and has been using Keystone and [employer]	[place] at home. 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 She has chronic venous hypertension and stage III lymphedema. She came in today saying she did not want debridement and really was not interested in compression wraps saying they fall down consistently. She does not have compression pumps 6/20; patient has missed her last 3 follow-up appointments. She has been using Keystone antibiotic ointment and Hydrofera Blue. We discussed potentially doing an in office wrap and patient declined Today. She states she will try the in office wrap next week. She denies signs of infection. 7/3; patient presents for follow-up. She missed her last clinic appointment. She declines doing the in office wrap today. She has been using Hydrofera Blue with antibiotic ointment. [address] [name], [name] ([id number]) [id number] Page 10 of 11 Constitutional Vitals Time Taken: 4:10 PM, Height: 66 in, Weight: 400 lbs, BMI: 64.6, Temperature: 98.3 F, Pulse: 101 bpm, Respiratory Rate: 18 breaths/min, Blood Pressure: 135/85 mmHg. General Notes: Left lower extremity: 2 open wounds with granulation tissue and nonviable tissue. Uncontrolled lower extremity edema with Significant hemosiderin deposition.  Varicosities noted. Integumentary (Hair, Skin) Wound #5 status is Open. Original cause of wound was Gradually Appeared. The date acquired was: 04/10/2022. The wound has been in treatment 26 weeks. The wound is located on the Left,Medial Lower Leg. The wound measures 1.5cm length x 3cm width x 0.2cm depth; 3.534cm^2 area and 0.707cm^3 volume. There is Fat Layer (Subcutaneous Tissue) exposed. There is a medium amount of serosanguineous drainage noted. There is  medium (34-66%) red granulation within the wound bed. There is a medium (34-66%) amount of necrotic tissue within the wound bed including Adherent Slough. Assessment Active Problems ICD-10 Chronic venous hypertension (idiopathic) with ulcer of left lower extremity Non-pressure chronic ulcer of other part of left lower leg with fat layer exposed Lymphedema, not elsewhere classified Patient's wound is stable. I debrided nonviable tissue. At this time she would like to hold off on doing the in office compression wrap as she has had a death in the family and does not want to proceed with this today. She states she will do it at next clinic visit. For now continue [facility] antibiotic ointment and Hydrofera Blue. Procedures Wound #5 Pre-procedure diagnosis of Wound #5 is a Venous Leg Ulcer located on the Left,Medial Lower Leg .Severity of Tissue Pre Debridement is: Fat layer exposed. There was a Selective/Open Wound Non-Viable Tissue Debridement with a total area of 3.53 sq cm performed by Geralyn Corwin, MD. With the following instrument(s): Curette to remove Viable and Non-Viable tissue/material. Material removed includes Slough. A time out was conducted at 16:22, prior to the start of the procedure. A Minimum amount of bleeding was controlled with Pressure. The procedure was tolerated well. Post Debridement Measurements: 1.5cm length x 3cm width x 0.2cm depth; 0.707cm^3 volume. Character of Wound/Ulcer Post Debridement is improved. Severity of Tissue Post Debridement is: Fat layer exposed. Post procedure Diagnosis Wound #5: Same as Pre-Procedure Plan Follow-up Appointments: Wound #5 Left,Medial Lower Leg: Return Appointment in 2 weeks. Bathing/ Shower/ Hygiene: Wash wounds with antibacterial soap and water. May shower with wound dressing protected with water repellent cover or cast protector. No tub bath. Edema Control - Lymphedema / Segmental Compressive Device / Other: Wound #5  Left,Medial Lower Leg: Patient to wear own compression stockings. Remove compression stockings every night before going to bed and put on every morning when getting up. - LLE, Elevate, Exercise Daily and Avoid Standing for Long Periods of Time. Elevate legs to the level of the heart and pump ankles as often as possible Elevate leg(s) parallel to the floor when sitting. Medications-Please add to medication list.: Wound #5 Left,Medial Lower Leg: Keystone Compound WOUND #5: - Lower Leg Wound Laterality: Left, Medial Cleanser: Soap and Water 1 x Per Day/30 Days Discharge Instructions: Gently cleanse wound with antibacterial soap, rinse and pat dry prior to dressing wounds Cleanser: Vashe 5.8 (oz) 1 x Per Day/30 Days Discharge Instructions: Use vashe 5.8 (oz) as directed Peri-Wound Care: AandD Ointment 1 x Per Day/30 Days Discharge Instructions: Apply AandD Ointment as directed around edges to help dressing from sticking Topical: Gentamicin 1 x Per Day/30 Days Discharge Instructions: Apply as directed by provider. Topical: Mupirocin Ointment 1 x Per Day/30 Days Discharge Instructions: Apply as directed by provider. Topical: keystone 1 x Per Day/30 Days Prim Dressing: Hydrofera Blue Ready Transfer Foam, 2.5x2.5 (in/in) 1 x Per Day/30 Days ary Discharge Instructions: Apply Hydrofera Blue Ready to wound bed as directed cut to fit wound bed Secondary Dressing: (BORDER) Zetuvit Plus SILICONE BORDER Dressing 4x4 (in/in) 1 x Per  Day/30 Days Discharge Instructions: Please do not put silicone bordered dressings under wraps. Use non-bordered dressing only. [name], [name] ([id number]) [id number] Page 11 of 11 1. In office sharp debridement 2. Keystone antibiotic ointment and Hydrofera Blue 3. Compression stockings daily 4. Follow-up in 2 weeks Electronic Signature(s) Signed: 10/17/2022 4:28:10 PM By: Geralyn Corwin DO Entered By: Geralyn Corwin on 10/17/2022  16:26:12 -------------------------------------------------------------------------------- SuperBill Details Patient Name: Date of Service: [name] Moody MA J. 10/17/2022 Medical Record Number: [id number] Patient Account Number: [id number] Date of Birth/Sex: Treating RN: February 13, 1986 (37 y.o. Cathlean Cower, Kim Primary Care Provider: [name] Other Clinician: [name] Referring Provider: Treating Provider/Extender: Emeline Darling in Treatment: 26 Diagnosis Coding ICD-10 Codes Code Description [phone] Chronic venous hypertension (idiopathic) with ulcer of left lower extremity L97.822 Non-pressure chronic ulcer of other part of left lower leg with fat layer exposed I89.0 Lymphedema, not elsewhere classified Facility Procedures : CPT4 Code: [id number] Description: [phone] - DEBRIDE WOUND 1ST 20 SQ CM OR < ICD-10 Diagnosis Description I87.312 Chronic venous hypertension (idiopathic) with ulcer of left lower extremity L97.822 Non-pressure chronic ulcer of other part of left lower leg with fat layer expose Modifier: d Quantity: 1 Physician Procedures : CPT4 Code Description Modifier [id number] - WC PHYS DEBR WO ANESTH 20 SQ CM ICD-10 Diagnosis Description I87.312 Chronic venous hypertension (idiopathic) with ulcer of left lower extremity L97.822 Non-pressure chronic ulcer of other part of left  lower leg with fat layer exposed Quantity: 1 Electronic Signature(s) Signed: 10/17/2022 4:28:10 PM By: Geralyn Corwin DO Entered By: Geralyn Corwin on 10/17/2022 16:26:22

## 2022-11-14 ENCOUNTER — Ambulatory Visit: Payer: BC Managed Care – PPO | Admitting: Internal Medicine

## 2022-11-15 ENCOUNTER — Other Ambulatory Visit: Payer: Self-pay | Admitting: *Deleted

## 2022-11-15 ENCOUNTER — Telehealth: Payer: Self-pay | Admitting: *Deleted

## 2022-11-15 DIAGNOSIS — I83892 Varicose veins of left lower extremities with other complications: Secondary | ICD-10-CM

## 2022-11-15 NOTE — Telephone Encounter (Signed)
Returning [name] telephone call regarding scheduling vein surgery ( outpatient [facility]) that she cancelled 01-30-2022.  Informed [name] that she would need to have a repeat venous reflux exam (left leg) and have a follow up appointment with Dr. Myra Gianotti since her last venous reflux exam and last office visit was > 12 months ago. These visits will need to be scheduled and completed before a prior authorization for surgery can be initiated.  [name] verbalized understanding.  Will have [facility] schedulers reach out to her to schedule venous reflux exam (left leg) and [facility] FU with Dr. Myra Gianotti.

## 2022-11-28 ENCOUNTER — Encounter: Payer: BC Managed Care – PPO | Attending: Internal Medicine | Admitting: Internal Medicine

## 2022-11-28 DIAGNOSIS — E669 Obesity, unspecified: Secondary | ICD-10-CM | POA: Insufficient documentation

## 2022-11-28 DIAGNOSIS — I87312 Chronic venous hypertension (idiopathic) with ulcer of left lower extremity: Secondary | ICD-10-CM | POA: Insufficient documentation

## 2022-11-28 DIAGNOSIS — I10 Essential (primary) hypertension: Secondary | ICD-10-CM | POA: Insufficient documentation

## 2022-11-28 DIAGNOSIS — L97822 Non-pressure chronic ulcer of other part of left lower leg with fat layer exposed: Secondary | ICD-10-CM | POA: Insufficient documentation

## 2022-11-28 DIAGNOSIS — I890 Lymphedema, not elsewhere classified: Secondary | ICD-10-CM | POA: Insufficient documentation

## 2022-11-28 DIAGNOSIS — Z6841 Body Mass Index (BMI) 40.0 and over, adult: Secondary | ICD-10-CM | POA: Insufficient documentation

## 2022-11-29 NOTE — Progress Notes (Signed)
[name], [name] ([id number]) [id number] Page 1 of 9 Visit Report for 11/28/2022 Arrival Information Details Patient Name: Date of Service: [name], [name] [name]. 11/28/2022 3:30 PM Medical Record Number: [id number] Patient Account Number: [id number] Date of Birth/Sex: Treating RN: 1986-03-24 (37 y.o. Esmeralda Links Primary Care Dael Howland: [name] Other Clinician: Referring Larkyn Greenberger: Treating Muhammad Vacca/Extender: Chauncey Mann, MICHA EL Caleb Popp in Treatment: 32 Visit Information History Since Last Visit Added or deleted any medications: No Patient Arrived: Ambulatory Any new allergies or adverse reactions: No Arrival Time: 15:44 Had a fall or experienced change in No Accompanied By: self activities of daily living that may affect Transfer Assistance: None risk of falls: Patient Identification Verified: Yes Hospitalized since last visit: No Secondary Verification Process Completed: Yes Has Dressing in Place as Prescribed: Yes Patient Requires Transmission-Based Precautions: No Pain Present Now: Yes Patient Has Alerts: No Electronic Signature(s) Signed: 11/28/2022 4:42:29 PM By: Angelina Pih Entered By: Angelina Pih on 11/28/2022 15:45:22 -------------------------------------------------------------------------------- Clinic Level of Care Assessment Details Patient Name: Date of Service: [name] [name]. 11/28/2022 3:30 PM Medical Record Number: [id number] Patient Account Number: [id number] Date of Birth/Sex: Treating RN: 04-19-1985 (37 y.o. Esmeralda Links Primary Care Lelia Jons: [name] Other Clinician: Referring Kendel Pesnell: Treating Shamel Galyean/Extender: RO BSO Dorris Carnes, MICHA EL Caleb Popp in Treatment: 32 Clinic Level of Care Assessment Items TOOL 1 Quantity Score []  - 0 Use when EandM and Procedure is performed on INITIAL visit ASSESSMENTS - Nursing Assessment / Reassessment []  - 0 General Physical Exam  (combine w/ comprehensive assessment (listed just below) when performed on new pt. evals) []  - 0 Comprehensive Assessment (HX, ROS, Risk Assessments, Wounds Hx, etc.) ASSESSMENTS - Wound and Skin Assessment / Reassessment []  - 0 Dermatologic / Skin Assessment (not related to wound area) ASSESSMENTS - Ostomy and/or Continence Assessment and Care DI, BAGOT ([id number]) [id number] Page 2 of 9 []  - 0 Incontinence Assessment and Management []  - 0 Ostomy Care Assessment and Management (repouching, etc.) PROCESS - Coordination of Care []  - 0 Simple Patient / Family Education for ongoing care []  - 0 Complex (extensive) Patient / Family Education for ongoing care []  - 0 Staff obtains Chiropractor, Records, T Results / Process Orders est []  - 0 Staff telephones HHA, Nursing Homes / Clarify orders / etc []  - 0 Routine Transfer to another Facility (non-emergent condition) []  - 0 Routine Hospital Admission (non-emergent condition) []  - 0 New Admissions / Manufacturing engineer / Ordering NPWT Apligraf, etc. , []  - 0 Emergency Hospital Admission (emergent condition) PROCESS - Special Needs []  - 0 Pediatric / Minor Patient Management []  - 0 Isolation Patient Management []  - 0 Hearing / Language / Visual special needs []  - 0 Assessment of Community assistance (transportation, D/C planning, etc.) []  - 0 Additional assistance / Altered mentation []  - 0 Support Surface(s) Assessment (bed, cushion, seat, etc.) INTERVENTIONS - Miscellaneous []  - 0 External ear exam []  - 0 Patient Transfer (multiple staff / Nurse, adult / Similar devices) []  - 0 Simple Staple / Suture removal (25 or less) []  - 0 Complex Staple / Suture removal (26 or more) []  - 0 Hypo/Hyperglycemic Management (do not check if billed separately) []  - 0 Ankle / Brachial Index (ABI) - do not check if billed separately Has the patient been seen at the hospital within the last three years:  Yes Total Score: 0 Level Of Care: ____ Electronic Signature(s) Signed: 11/28/2022 4:42:29 PM By: Angelina Pih Entered By: Roger Shelter,  Caitlin on 11/28/2022 16:14:55 -------------------------------------------------------------------------------- Encounter Discharge Information Details Patient Name: Date of Service: [name] [place]. 11/28/2022 3:30 PM Medical Record Number: [id number] Patient Account Number: [id number] Date of Birth/Sex: Treating RN: March 07, 1986 (37 y.o. Esmeralda Links Primary Care Wilkins Elpers: [name] Other Clinician: Referring Sheridan Hew: Treating Jailon Schaible/Extender: RO BSO Dorris Carnes, MICHA EL Caleb Popp in Treatment: [phone] Encounter Discharge Information Items Post Procedure Vitals Discharge Condition: Stable Temperature (F): [address] [name] ([id number]) [id number] Page 3 of 9 Ambulatory Status: Ambulatory Pulse (bpm): 112 Discharge Destination: Home Respiratory Rate (breaths/min): 18 Transportation: Private Auto Blood Pressure (mmHg): 149/88 Accompanied By: self Schedule Follow-up Appointment: Yes Clinical Summary of Care: Electronic Signature(s) Signed: 11/28/2022 4:42:29 PM By: Angelina Pih Entered By: Angelina Pih on 11/28/2022 16:16:09 -------------------------------------------------------------------------------- Lower Extremity Assessment Details Patient Name: Date of Service: [name], [name]. 11/28/2022 3:30 PM Medical Record Number: [id number] Patient Account Number: [id number] Date of Birth/Sex: Treating RN: [birth year] (37 y.o. Esmeralda Links Primary Care Dainelle Hun: [name] Other Clinician: Referring Perrin Gens: Treating Alayne Estrella/Extender: RO BSO N, MICHA EL Caleb Popp in Treatment: 32 Edema Assessment Assessed: [Left: No] [Right: No] Edema: [Left: Ye] [Right: s] Calf Left: Right: Point of Measurement: 32 cm From Medial Instep 56 cm Ankle Left: Right: Point of  Measurement: 12 cm From Medial Instep 32 cm Vascular Assessment Pulses: Dorsalis Pedis Palpable: [Left:Yes] Extremity colors, hair growth, and conditions: Extremity Color: [Left:Normal] Hair Growth on Extremity: [Left:Yes] Temperature of Extremity: [Left:Warm < 3 seconds] Toe Nail Assessment Left: Right: Thick: No Discolored: No Deformed: No Improper Length and Hygiene: No Electronic Signature(s) Signed: 11/28/2022 4:42:29 PM By: Angelina Pih Entered By: Angelina Pih on 11/28/2022 15:56:37 Casselman, [name] ([id number]) [id number] Page 4 of 9 -------------------------------------------------------------------------------- Multi Wound Chart Details Patient Name: Date of Service: [name], [name] [name]. 11/28/2022 3:30 PM Medical Record Number: [id number] Patient Account Number: [id number] Date of Birth/Sex: Treating RN: 17-Apr-1985 (37 y.o. Esmeralda Links Primary Care Onedia Vargus: [name] Other Clinician: Referring Darrelle Barrell: Treating Liani Caris/Extender: RO BSO N, MICHA EL Caleb Popp in Treatment: 32 Vital Signs Height(in): 66 Pulse(bpm): 112 Weight(lbs): 400 Blood Pressure(mmHg): 149/88 Body Mass Index(BMI): 64.6 Temperature(F): 98 Respiratory Rate(breaths/min): 18 [5:Photos:] [N/A:N/A] Left, Medial Lower Leg N/A N/A Wound Location: Gradually Appeared N/A N/A Wounding Event: Venous Leg Ulcer N/A N/A Primary Etiology: Hypertension, Peripheral Venous N/A N/A Comorbid History: Disease 04/10/2022 N/A N/A Date Acquired: 51 N/A N/A Weeks of Treatment: Open N/A N/A Wound Status: No N/A N/A Wound Recurrence: 1.5x3.5x0.2 N/A N/A Measurements L x W x D (cm) 4.123 N/A N/A A (cm) : rea 0.825 N/A N/A Volume (cm) : 25.00% N/A N/A % Reduction in Area: 25.00% N/A N/A % Reduction in Volume: Full Thickness Without Exposed N/A N/A Classification: Support Structures Medium N/A N/A Exudate Amount: Serosanguineous N/A  N/A Exudate Type: red, brown N/A N/A Exudate Color: Medium (34-66%) N/A N/A Granulation Amount: Red N/A N/A Granulation Quality: Medium (34-66%) N/A N/A Necrotic Amount: Fat Layer (Subcutaneous Tissue): Yes N/A N/A Exposed Structures: Fascia: No Tendon: No Muscle: No Joint: No Bone: No Small (1-33%) N/A N/A Epithelialization: Treatment Notes Electronic Signature(s) Signed: 11/28/2022 4:42:29 PM By: Angelina Pih Entered By: Angelina Pih on 11/28/2022 15:59:53 Rindfleisch, [name] ([id number]) [id number] Page 5 of 9 -------------------------------------------------------------------------------- Multi-Disciplinary Care Plan Details Patient Name: Date of Service: [name], [name] [name]. 11/28/2022 3:30 PM Medical Record Number: [id number] Patient Account Number: [id number] Date of Birth/Sex: Treating RN: [birth year] (37 y.o. Esmeralda Links Primary Care Aland Chestnutt:  [name] Other Clinician: Referring Cayden Rautio: Treating Katria Botts/Extender: RO BSO N, MICHA EL Caleb Popp in Treatment: 32 Active Inactive Wound/Skin Impairment Nursing Diagnoses: Knowledge deficit related to ulceration/compromised skin integrity Goals: Patient/caregiver will verbalize understanding of skin care regimen Date Initiated: 04/18/2022 Target Resolution Date: 07/18/2022 Goal Status: Active Ulcer/skin breakdown will have a volume reduction of 30% by week 4 Date Initiated: 04/18/2022 Date Inactivated: 07/04/2022 Target Resolution Date: 05/19/2022 Goal Status: Unmet Unmet Reason: comorbidities Ulcer/skin breakdown will have a volume reduction of 50% by week 8 Date Initiated: 04/18/2022 Date Inactivated: 07/04/2022 Target Resolution Date: 06/18/2022 Goal Status: Unmet Unmet Reason: comorbidities Ulcer/skin breakdown will have a volume reduction of 80% by week 12 Date Initiated: 04/18/2022 Target Resolution Date: 07/18/2022 Goal Status: Active Ulcer/skin breakdown will  heal within 14 weeks Date Initiated: 04/18/2022 Target Resolution Date: 08/17/2022 Goal Status: Active Interventions: Assess patient/caregiver ability to obtain necessary supplies Assess patient/caregiver ability to perform ulcer/skin care regimen upon admission and as needed Assess ulceration(s) every visit Notes: Electronic Signature(s) Signed: 11/28/2022 4:42:29 PM By: Angelina Pih Entered By: Angelina Pih on 11/28/2022 16:15:14 -------------------------------------------------------------------------------- Pain Assessment Details Patient Name: Date of Service: [name]. 11/28/2022 3:30 PM Medical Record Number: [id number] Patient Account Number: [id number] [name], [name] ([id number]) [phone].pdf Page 6 of 9 Date of Birth/Sex: Treating RN: [birth year] (37 y.o. Esmeralda Links Primary Care Phillip Maffei: Other Clinician: [name] Referring Elverda Wendel: Treating Sheleen Conchas/Extender: Chauncey Mann, MICHA EL Caleb Popp in Treatment: 32 Active Problems Location of Pain Severity and Description of Pain Patient Has Paino Yes Site Locations Rate the pain. Current Pain Level: 5 Pain Management and Medication Current Pain Management: Electronic Signature(s) Signed: 11/28/2022 4:42:29 PM By: Angelina Pih Entered By: Angelina Pih on 11/28/2022 15:47:55 -------------------------------------------------------------------------------- Patient/Caregiver Education Details Patient Name: Date of Service: [name]. 8/14/2024andnbsp3:30 PM Medical Record Number: [id number] Patient Account Number: [id number] Date of Birth/Gender: Treating RN: 10/11/1985 (37 y.o. Esmeralda Links Primary Care Physician: [name] Other Clinician: Referring Physician: Treating Physician/Extender: RO BSO Dorris Carnes, MICHA EL Caleb Popp in Treatment: 32 Education Assessment Education Provided To: Patient Education Topics  Provided Wound Debridement: Handouts: Wound Debridement Methods: Explain/Verbal Responses: State content correctly Wound/Skin Impairment: Handouts: Caring for Your Ulcer Methods: Explain/Verbal [name], [name] ([id number]) [id number] Page 7 of 9 Responses: State content correctly Electronic Signature(s) Signed: 11/28/2022 4:42:29 PM By: Angelina Pih Entered By: Angelina Pih on 11/28/2022 16:15:28 -------------------------------------------------------------------------------- Wound Assessment Details Patient Name: Date of Service: [name], RIZKALLA MA [name]. 11/28/2022 3:30 PM Medical Record Number: [id number] Patient Account Number: [id number] Date of Birth/Sex: Treating RN: 06/06/1985 (37 y.o. Esmeralda Links Primary Care Jayton Popelka: [name] Other Clinician: Referring Iven Earnhart: Treating Daric Koren/Extender: RO BSO N, MICHA EL Caleb Popp in Treatment: 32 Wound Status Wound Number: 5 Primary Etiology: Venous Leg Ulcer Wound Location: Left, Medial Lower Leg Wound Status: Open Wounding Event: Gradually Appeared Comorbid History: Hypertension, Peripheral Venous Disease Date Acquired: 04/10/2022 Weeks Of Treatment: 32 Clustered Wound: No Photos Wound Measurements Length: (cm) 1.5 Width: (cm) 3.5 Depth: (cm) 0.2 Area: (cm) 4.123 Volume: (cm) 0.825 % Reduction in Area: 25% % Reduction in Volume: 25% Epithelialization: Small (1-33%) Tunneling: No Undermining: No Wound Description Classification: Full Thickness Without Exposed Suppor Exudate Amount: Medium Exudate Type: Serosanguineous Exudate Color: red, brown t Structures Foul Odor After Cleansing: No Slough/Fibrino Yes Wound Bed Granulation Amount: Medium (34-66%) Exposed Structure Granulation Quality: Red Fascia Exposed: No Necrotic Amount: Medium (34-66%) Fat Layer (Subcutaneous Tissue)  Exposed: Yes Necrotic Quality: Adherent Slough Tendon Exposed: No Muscle Exposed:  No Joint Exposed: No Bone Exposed: No [name], [name] ([id number]) [id number] Page 8 of 9 Treatment Notes Wound #5 (Lower Leg) Wound Laterality: Left, Medial Cleanser Byram Ancillary Kit - 15 Day Supply Discharge Instruction: Use supplies as instructed; Kit contains: (15) Saline Bullets; (15) 3x3 Gauze; 15 pr Gloves Soap and Water Discharge Instruction: Gently cleanse wound with antibacterial soap, rinse and pat dry prior to dressing wounds Vashe 5.8 (oz) Discharge Instruction: Use vashe 5.8 (oz) as directed Peri-Wound Care AandD Ointment Discharge Instruction: Apply AandD Ointment as directed around edges to help dressing from sticking Topical Gentamicin Discharge Instruction: IN OFFICE ONLY Apply as directed by Aryelle Figg. Mupirocin Ointment Discharge Instruction: IN OFFICE ONLY Apply as directed by Daleah Coulson. Primary Dressing Hydrofera Blue Ready Transfer Foam, 2.5x2.5 (in/in) Discharge Instruction: Apply Hydrofera Blue Ready to wound bed as directed cut to fit wound bed Secondary Dressing (BORDER) Zetuvit Plus SILICONE BORDER Dressing 4x4 (in/in) Discharge Instruction: Please do not put silicone bordered dressings under wraps. Use non-bordered dressing only. Secured With Compression Wrap Compression Stockings Add-Ons Electronic Signature(s) Signed: 11/28/2022 4:42:29 PM By: Angelina Pih Entered By: Angelina Pih on 11/28/2022 15:55:26 -------------------------------------------------------------------------------- Vitals Details Patient Name: Date of Service: [name]. 11/28/2022 3:30 PM Medical Record Number: [id number] Patient Account Number: [id number] Date of Birth/Sex: Treating RN: 1986/01/09 (37 y.o. Esmeralda Links Primary Care Ameer Sanden: [name] Other Clinician: Referring Allanna Bresee: Treating Akirra Lacerda/Extender: RO BSO N, MICHA EL Caleb Popp in Treatment: 32 Vital Signs Time Taken: 15:45 Temperature  (F): 98 Height (in): 66 Pulse (bpm): 112 Weight (lbs): 400 Respiratory Rate (breaths/min): 18 Body Mass Index (BMI): 64.6 Blood Pressure (mmHg): 149/88 Reference Range: 80 - 120 mg / dl [name], [name] ([id number]) [id number] Page 9 of 9 Notes pt states wheezing last couple of weeks, pt states SOB quicker Electronic Signature(s) Signed: 11/28/2022 4:42:29 PM By: Angelina Pih Entered By: Angelina Pih on 11/28/2022 15:47:40

## 2022-11-29 NOTE — Progress Notes (Signed)
[name] ([id number]) [id number] Page 1 of 11 Visit Report for 11/28/2022 Debridement Details Patient Name: Date of Service: [name], [name] [name]. 11/28/2022 3:30 PM Medical Record Number: [id number] Patient Account Number: [id number] Date of Birth/Sex: Treating RN: February 23, 1986 (37 y.o. [name] Primary Care Provider: [name] Other Clinician: Referring Provider: Treating Provider/Extender: RO BSO Dorris Carnes, MICHA EL Caleb Popp in Treatment: 32 Debridement Performed for Assessment: Wound #5 Left,Medial Lower Leg Performed By: Physician Maxwell Caul, MD Debridement Type: Debridement Severity of Tissue Pre Debridement: Fat layer exposed Level of Consciousness (Pre-procedure): Awake and Alert Pre-procedure Verification/Time Out Yes - 16:01 Taken: Pain Control: Lidocaine 4% T opical Solution Percent of Wound Bed Debrided: 100% T Area Debrided (cm): otal 4.12 Tissue and other material debrided: Viable, Non-Viable, Slough, Subcutaneous, Slough Level: Skin/Subcutaneous Tissue Debridement Description: Excisional Instrument: Curette Bleeding: Moderate Hemostasis Achieved: Pressure Response to Treatment: Procedure was tolerated well Level of Consciousness (Post- Awake and Alert procedure): Post Debridement Measurements of Total Wound Length: (cm) 1.5 Width: (cm) 3.5 Depth: (cm) 0.2 Volume: (cm) 0.825 Character of Wound/Ulcer Post Debridement: Stable Severity of Tissue Post Debridement: Fat layer exposed Post Procedure Diagnosis Same as Pre-procedure Electronic Signature(s) Signed: 11/28/2022 4:20:15 PM By: Baltazar Najjar MD Signed: 11/28/2022 4:42:29 PM By: Angelina Pih Entered By: Angelina Pih on 11/28/2022 16:03:21 HPI Details -------------------------------------------------------------------------------- [name] ([id number]) [id number] Page 2 of 11 Patient Name: Date of  Service: [name] [name]. 11/28/2022 3:30 PM Medical Record Number: [id number] Patient Account Number: [id number] Date of Birth/Sex: Treating RN: 26-Oct-1985 (37 y.o. [name] Primary Care Provider: [name] Other Clinician: Referring Provider: Treating Provider/Extender: Chauncey Mann, MICHA EL Caleb Popp in Treatment: 32 History of Present Illness HPI Description: 37 year old patient who started with having ulcerations on the right lower leg on the lateral part of her ankle for about 2 weeks. She was seen in the ER at [facility] and advised to see the wound care for a consultation. No X-rays of workup was done during the ER visit and no prescription for any medications of compression wraps were given. the patient is not diabetic but does have hypertension and her medications have been reviewed by me. 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 The right and left greater saphenous vein was found to be tortuous. Deep venous system was also not competent and there was reflux of more than 500 ms. She was then seen by Dr. Karie Schwalbe Early who recommended that the patient would not benefit from endovenous ablation and he had recommended vein stripping odd on the right side and multiple small phlebectomy procedures on the left side. the patient did not follow-up due to social economic reasons. She has not been wearing any compression stockings and has not taken any specific treatment for varicose veins for the last 3 years. 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 We have obtained some appointments to see the vascular surgeons by the end of the month and the patient would like to follow up with me at my  [facility] on Wednesday, June 29. 10/14/2014 -- she could not see me yesterday in [place] and hence has come for a review today. She has a  vascular workup to be done this afternoon at [facility]. She is doing fine otherwise. 10/22/2014 -- she was seen by Dr. Brantley Fling and he has recommended surgical removal of her right saphenous vein from distal thigh to saphenofemoral odd junction and stab phlebectomy's of multiple large tributary branches throughout her thigh and calf. This would be done under general anesthesia in the outpatient setting. 10/29/2014 -- she is trying to work on a surgical date and in the meanwhile we have got insurance clearance for Apligraf and we will start this next week. 7/22 2016 -- she is here for the first application of Apligraf. 11/19/2014 -- she is here for a second application of Apligraf 11/26/2014 -- she has done fine after her last application of Apligraf and is awaiting her surgery which is scheduled for August 31. 12/03/2014 -- she is doing fine and is here for her third application of Apligraf. 12/21/2014 -- She had surgery on 12/15/2014 by Dr.Early who did #1 ligation and stripping of right great saphenous vein from distal thigh to saphenofemoral junction, #2 stab phlebectomy of large tributary varicose veins in the thigh popliteal space and calf. She had an Ace wrap up to her groin and this was removed today and the Unna's boot was also removed. 12/28/2014 -- she is here for her fourth application of Apligraf. 01/06/2015 - he saw her vascular surgeon Dr. Arbie Cookey who was pleased with her progress and he has confirmed that no surgical procedures could be attempted on the left side. 01/13/2015 -- her wound looks very good and she's been having no problems whatsoever. Readmission: 07/26/2020 upon evaluation today patient presents for initial inspection here in our clinic for a new issue with her left leg although she is previously been seen due to issues  with the right leg back in 2016. At that time she was seeing Dr. Arbie Cookey who is a vein/vascular specialist in [place]. He has since semiretired from what I understand. He is working out of [employer] I believe. Nonetheless she tells me at the time that there was really nothing to do for her left leg although the right leg was where they did most of the work. Subsequently she states that she is done fairly well until just in the past week where she had issues with bleeding from what appears to be varicose vein on the left leg medially. Unfortunately this has continued to be an issue although she tells me at first it was coming much more significantly Down quite a bit but nonetheless has not completely resolved. Every time she showers she notices that it starts to drain a little bit more. She does have a history of chronic venous insufficiency, lymphedema, varicose veins bilaterally, and obesity. 08/02/2020 upon evaluation today patient appears to be doing about the same in regards to the ulcer on her left leg. She has some eschar covering there is definitely some fluid collecting underneath unfortunately. With that being said she tells me she is still having a tremendous amount of pain therefore she is really not able to allow me to clean this off very effectively to be perfectly honest. I think we need to try to soften this up 08/16/2020 upon evaluation today patient's wound is really not doing significantly better not really states about the same. She notes that the wrap just does not seem to be staying up very well at all unfortunately. No fevers, chills, nausea, vomiting, or diarrhea. She did cut it off once it starts to slide in order to  alleviate some of the pressure from sliding Down. Fortunately there is no signs of active infection at this time which is great news. 08/23/2020 upon evaluation today patient appears to be doing well 08/23/2020 upon evaluation today patient appears to be doing well with  regard to her wound all things considered. Fortunately there does not appear to be any signs of active infection at this time which is great news. She has been tolerating the dressing changes without complication and overall I am extremely pleased with where things stand at this point. She does have her appointment with vascular in [facility] on June 9. 08/30/2020 upon evaluation today patient actually appears to be doing decently well in regard to her wound. Fortunately there is no signs of active infection which is great news. Nonetheless I do believe that the patient is going require little bit of debridement if she is okay with me attempting that today I think that will help clean off some of the necrotic tissue. Fortunately there does not appear to be otherwise any evidence of active infection which is also great news. 09/19/2020 upon evaluation today patient appears to be doing a little better in regard to her wound as compared to previous. Fortunately there does not appear to be any signs of active infection overall. No fever chills noted. I do believe that the Iodosorb has made this a little bit better with regard to the overall size and appearance of the wound bed though again she does still have quite a ways to go to get this to heal she still very tender to touch. 09/27/2020 upon evaluation today patient appears to actually be doing quite well with regard to her wound. This is measuring much smaller which is great news. With that being said she did see vein and vascular in [facility] and they subsequently recommended that surgery is really what she probably needs to go forward with sounds like the potential for venous ablation. With that being said the patient tells me this is just not the right time for her to be able to proceed with any type of surgery which I completely understand. Nonetheless I do believe that she would continue to benefit from compression but again that is really not  something that she is able to easily do. 10/04/2020 upon evaluation today patient appears to be doing about the same in regard to her wound. This is measuring a little bit smaller but nonetheless still is open and again has some slough and biofilm noted on the surface of the wound. There does not appear to be any signs of active infection which is great news. No fevers, chills, nausea, vomiting, or diarrhea. 10/04/2020 upon evaluation today patient appears to be doing well with regard to her wound. She has been tolerating dressing changes without complication. Fortunately there does not appear to be any signs of active infection which is great news. No fevers, chills, nausea, vomiting, or diarrhea. [name], [name] ([id number]) [id number] Page 3 of 11 10/25/2020 upon evaluation today patient appears to be doing well with regard to her wound. She has been tolerating the dressing changes without complication. Fortunately there is no signs of active infection at this time. No fevers, chills, nausea, vomiting, or diarrhea. 11/01/2020 upon evaluation today patient with regard to her wound. She has been tolerating the dressing changes without complication. Fortunately there does not appear to be any signs of infection which is great news. No fever chills noted 11/15/2020 upon evaluation today patient appears to be doing  well with regard to her wound. Fortunately there is no signs of active infection at this time. No fevers, chills, nausea, vomiting, or diarrhea. With that being said she continues to have a significant amount of pain at the site even though this is very close to complete closure. She also had several varicose veins around the area which were also problematic. Overall however I feel like the patient is making excellent progress. 11/28/2020 upon evaluation today patient appears to be doing well with regard to her leg ulcer. Again were not really able to debride or  compression wrap her due to discomfort and pain. She does not allow for that. With that being said we have been using Iodosorb which does seem to be doing decently well. Fortunately there is no signs of active infection at this time which is great news. No fevers, chills, nausea, vomiting, or diarrhea. 8/31; patient presents for 2-week follow-up. She has been using Iodosorb. She reports that the wound is closed. She denies signs of infection. Readmission: 10-27-2021 upon evaluation this is a patient that presents today whom I have previously seen this is pretty much for the same issue though I think a little bit higher than the last time I saw her. She does have a history of chronic venous insufficiency and hypertension along with varicose veins. Subsequently she does have an ulceration which spontaneously ruptured she has not been wearing any compression which I think is a big part of the issue here. We discussed this before I really think she probably needs to be wearing her compression therapy, she probably needs lymphedema pumps if she can ever wear the compression for a significant amount of time to get these, and subsequently also think that she needs to be elevating her legs is much as possible she may even need some vascular intervention in regard to her veins. All of this was reiterated and discussed with her today to reinforce what needs to happen in order to ensure that her legs do not get a lot worse. The patient voiced understanding. She tells me that she knows because she is seeing her mom go through a lot of this as well how bad things can get. 11-03-2021 upon evaluation today patient appears to be doing well with regard to her wound. Fortunately there does not appear to be any signs of active infection at this time. She is measuring a little bit bigger but I think this is because the wound is actually cleaning up a bit here. 7/27; left lateral leg wound not any smaller but perhaps with a  cleaner surface. She is using Iodoflex to help with the latter and using Tubigrip. She has chronic venous insufficiency with secondary lymphedema. She is apparently followed by vein and vascular and is being scheduled for an ablation 11-17-2021 upon evaluation today patient appears to be doing well with regard to her wound this is actually showing signs of improvement which is great news. Fortunately I do not see any evidence of active infection locally or systemically at this time which is great news. No fevers, chills, nausea, vomiting, or diarrhea. 11-24-2021 upon evaluation today patient's wound is actually showing signs of significant improvement. Unfortunately she had quite a bit of pain with debridement last week I do believe it was beneficial but at the same time she is doing much better but still really does not want this debrided again I think being that it is looking a whole lot better I would try to avoid that today  especially since it caused her so much discomfort that is really not the goal and I explained that to the patient today she voiced understanding and knows that it needed to be done but still states that it was quite painful. 11-30-2021 upon evaluation today patient appears to be doing excellent in regard to her wound this is actually showing signs of excellent improvement I am very pleased with where things stand. She does have her venous ablation appointment for October 17. 12-21-2021 upon evaluation today patient appears to be doing better in regard to her wound this is measuring smaller and looking better as well. Fortunately I do not see any signs of active infection locally or systemically which is great news. 01-01-2022 upon evaluation today patient appears to be doing well currently in regard to her wound. She is showing signs of improvement which is great news and overall I do not see any signs of active infection locally or systemically at this time. 01-08-2022 upon evaluation  today patient appears to be doing well currently in regard to her wound she is actually showing signs of significant improvement which is great news. Fortunately I do not see any evidence of active infection locally or systemically at this time. I do believe that we are on the right track. She also has her appointment October 19 for the venous ablation. 01-16-2022 upon evaluation today patient's wound actually is showing some signs of improvement although this is very slow. Fortunately I do not see any evidence of infection at this time. The volume is a little bit more although the size is smaller I think this is due to the fact that we are slowly cleaning this area out effectively. 11/6 continued improvement the patient is using Iodoflex and a Tubigrip E. She was supposed to have venous surgery at vein and vascular in [place] however somehow this is gotten delayed till December 14. 02-27-2022 upon evaluation today patient's wound is actually showing signs of being completely healed. Fortunately I do not see any evidence of active infection locally or systemically which is great news and overall I am extremely pleased with where we stand today. Unfortunately she does tell me that she postpone her venous ablation surgery until December 14 she tells me that she was not ready "financially" for this. 04/18/2022; Ms. Halla Landron is a 37 year old female with a past medical history of venous insufficiency that presents the clinic for a left lower extremity wound. She was seen almost 2 months ago for the same wound. This had healed with Iodoflex and Tubigrip. She states that the wound recently reopened. She has seen vein and vascular and plan is for ligation and stripping of the left great saphenous vein. She is not sure when this procedure is going to be scheduled. She has canceled it once before. She has had office compression wraps in the past however these do not stay on and create more of an issue for  her. She would like to avoid this. She currently denies signs of infection. 1/10; patient presents for follow-up. She has been using Iodoflex to the wound bed. She states she has been using her Tubigrip however she does not have this on today. She has no issues or complaints today. She denies signs of infection. 1/17; patient presents for follow-up. She has not been using Iodoflex to the wound bed. She reports acute pain. She denies increased warmth, erythema or purulent drainage to the left lower extremity. She states she has been using Tubigrip. She has  information to order the compression stockings but has not obtained them. 1/24; Patient had a wound culture done at last clinic visit that grew Proteus mirabilis and E. coli. She was prescribed levofloxacin and this should cover both bacteria. She has been taking the medication over the past week with improvement of symptoms to the wound bed. She has been using Hydrofera Blue under Tubigrip. She states the [facility] is sticking to her wound bed. 1/31; patient presents for follow-up. She has been using Medihoney to the wound bed with improvement in healing. She has been contacted by [facility] to order her antibiotic ointment. She is not sure yet if she is able to afford this. She received her compression stockings but states they did not fit well. She is working on returning these. She has been using Tubigrip. 2/7; patient presents for follow-up. She is been using Medihoney to the wound bed. She obtained her compression stockings and has been using these daily. She states that the [facility] antibiotic ointment is arriving in the mail tomorrow. She has no issues or complaints today. She denies systemic signs of infection. [name], [name] ([id number]) [id number] Page 4 of 11 2/14; patient presents for follow-up. She has been using Keystone antibiotic ointment to the wound bed. She states the [facility] is sticking  and she has stopped this. She is been using her compression stockings daily. She does not have these on today. 2/28; patient presents for follow-up. She continues to use [facility] antibiotic ointment to the wound bed. There is been improvement in healing. She has been using her compression stockings daily. 3/12; patient presents for follow-up. She has been using Keystone antibiotic ointment to the wound bed. She is not wearing her compression stockings today but states she does wear them daily. She declines debridement today. 3/20; patient presents for follow-up. She has been using Keystone antibiotic ointment with Hydrofera Blue. She states she is wearing her compression stockings daily but she does not have them on today. 3/27; patient presents for follow-up. She has been using Keystone antibiotic ointment and hydrofera blue to the wound bed. She reports wearing compression stockings. 4/10; patient presents for follow-up. She has been using Keystone antibiotic ointment and Hydrofera Blue to the wound bed. She reports wearing compression stockings. She has no issues or complaints today. 4/24; patient presents for follow-up. She has been using Keystone antibiotic ointment and Hydrofera Blue to the wound bed. Overall there is healthier tissue today. She reports wearing her compression stockings daily. 5/8; patient comes into clinic today with outer stocking on the left leg and with no Keystone. She has small wounds on the medial left lower leg and has been using Keystone and [employer]	[place] at home. 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 She has chronic venous hypertension and stage III lymphedema. She came in today saying she did not want debridement and really was not interested in compression wraps saying they fall down consistently. She does not have compression pumps 6/20; patient has missed her last 3 follow-up appointments. She has been using Keystone antibiotic ointment  and Hydrofera Blue. We discussed potentially doing an in office wrap and patient declined Today. She states she will try the in office wrap next week. She denies signs of infection. 7/3; patient presents for follow-up. She missed her last clinic appointment. She declines doing the in office wrap today. She has been using Hydrofera Blue with antibiotic ointment. 8/14; patient has not been here since  the beginning of July. She has wounds secondary to chronic venous insufficiency and lymphedema on the left medial lower leg. She has supposedly been using [facility]. She is covering this with some form of foam dressing. She is using compression stockings from elastic therapy which are probably 20/30 mmHg although we just do not seem to be able to identify that. She tells me she puts those on during the day and leaves them on all night and only changing them once a day. Electronic Signature(s) Signed: 11/28/2022 4:20:15 PM By: Baltazar Najjar MD Entered By: Baltazar Najjar on 11/28/2022 16:15:04 -------------------------------------------------------------------------------- Physical Exam Details Patient Name: Date of Service: [name]. 11/28/2022 3:30 PM Medical Record Number: [id number] Patient Account Number: [id number] Date of Birth/Sex: Treating RN: 10-Jan-1986 (37 y.o. [name] Primary Care Provider: [name] Other Clinician: Referring Provider: Treating Provider/Extender: RO BSO Dorris Carnes, MICHA EL Caleb Popp in Treatment: 32 Constitutional Patient is hypertensive.. Pulse regular and within target range for patient.Marland Kitchen Respirations regular, non-labored and within target range.. Temperature is normal and within the target range for the patient.Marland Kitchen appears in no distress. Notes Wound exam; left lower extremity 2 open wounds. The smaller one has a decent surface however the more posterior one is 100% covered in adherent slough. I used a #3 curette to  remove this try to be as gentle as possible as she does not tolerate debridements. Her edema control in the left leg is marginal Electronic Signature(s) Signed: 11/28/2022 4:20:15 PM By: Baltazar Najjar MD Entered By: Baltazar Najjar on 11/28/2022 16:15:54 Wentzel, [name] ([id number]) [id number] Page 5 of 11 -------------------------------------------------------------------------------- Physician Orders Details Patient Name: Date of Service: [name], [name] [place]. 11/28/2022 3:30 PM Medical Record Number: [id number] Patient Account Number: [id number] Date of Birth/Sex: Treating RN: [birth year] (37 y.o. [name] Primary Care Provider: [name] Other Clinician: Referring Provider: Treating Provider/Extender: RO BSO Dorris Carnes, MICHA EL Caleb Popp in Treatment: [phone] Verbal / Phone Orders: No Diagnosis Coding Follow-up Appointments Wound #5 Left,Medial Lower Leg Return Appointment in 1 week. Bathing/ [employer] wounds with antibacterial soap and water. May shower with wound dressing protected with water repellent cover or cast protector. No tub bath. Anesthetic (Use 'Patient Medications' Section for Anesthetic Order Entry) Lidocaine applied to wound bed Edema Control - Lymphedema / Segmental Compressive Device / Other Wound #5 Left,Medial Lower Leg Patient to wear own compression stockings. Remove compression stockings every night before going to bed and put on every morning when getting up. - LLE, per Leanord Hawking recommended patient re order 30/40 compression stockings, patient given up to date measurements for left lower extremity. Elevate, Exercise Daily and A void Standing for Long Periods of Time. Elevate legs to the level of the heart and pump ankles as often as possible Elevate leg(s) parallel to the floor when sitting. Medications-Please add to medication list. Wound #5 Left,Medial Lower Leg Keystone Compound - per Leanord Hawking states  no longer needed Wound Treatment Wound #5 - Lower Leg Wound Laterality: Left, Medial Cleanser: Byram Ancillary Kit - 15 Day Supply (Generic) 1 x Per Day/30 Days Discharge Instructions: Use supplies as instructed; Kit contains: (15) Saline Bullets; (15) 3x3 Gauze; 15 pr Gloves Cleanser: Soap and Water 1 x Per Day/30 Days Discharge Instructions: Gently cleanse wound with antibacterial soap, rinse and pat dry prior to dressing wounds Cleanser: Vashe 5.8 (oz) 1 x Per Day/30 Days Discharge Instructions: Use vashe 5.8 (oz) as directed Peri-Wound Care: AandD Ointment 1 x  Per Day/30 Days Discharge Instructions: Apply AandD Ointment as directed around edges to help dressing from sticking Topical: Gentamicin 1 x Per Day/30 Days Discharge Instructions: IN OFFICE ONLY Apply as directed by provider. Topical: Mupirocin Ointment 1 x Per Day/30 Days Discharge Instructions: IN OFFICE ONLY Apply as directed by provider. Prim Dressing: Hydrofera Blue Ready Transfer Foam, 2.5x2.5 (in/in) (DME) (Generic) 1 x Per Day/30 Days ary Discharge Instructions: Apply Hydrofera Blue Ready to wound bed as directed cut to fit wound bed Secondary Dressing: (BORDER) Zetuvit Plus SILICONE BORDER Dressing 4x4 (in/in) (DME) (Generic) 1 x Per Day/30 Days Discharge Instructions: Please do not put silicone bordered dressings under wraps. Use non-bordered dressing only. CAIRRA, BADOLATO ([id number]) [id number] Page 6 of 11 Electronic Signature(s) Signed: 11/28/2022 4:20:15 PM By: Baltazar Najjar MD Signed: 11/28/2022 4:42:29 PM By: Angelina Pih Entered By: Angelina Pih on 11/28/2022 16:14:48 -------------------------------------------------------------------------------- Problem List Details Patient Name: Date of Service: [name]. 11/28/2022 3:30 PM Medical Record Number: [id number] Patient Account Number: [id number] Date of Birth/Sex: Treating RN: 1986/01/12 (37 y.o. 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 11/28/2022 3:30 PM Medical Record Number: [id number] Patient Account Number: [id number] Date of Birth/Sex: Treating RN: 1985-07-10 (37 y.o. [place], [place], [place] [name] ([id number]) [id number] Page 7 of 11 Primary Care Provider: [name] Other Clinician: Referring Provider: Treating Provider/Extender: RO BSO N, MICHA EL Caleb Popp in Treatment: 32 Subjective History of Present Illness (HPI) 37 year old patient who started with having ulcerations on the right lower leg on the lateral part of her ankle for about 2 weeks. She was seen in the ER at [facility] and advised to see the wound care for a consultation. No X-rays of workup was done during the ER visit and no prescription for any medications of compression wraps were given. the patient is not diabetic but does have hypertension and her medications have been reviewed by me. 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 The right and left greater saphenous vein was found to be tortuous. Deep venous system was also not competent and there was reflux of more than 500 ms. She was then seen by Dr. Karie Schwalbe Early who recommended that the patient would not benefit from endovenous ablation and he had recommended vein stripping odd on the right side and multiple small phlebectomy procedures on the left side. the patient did not follow-up due to social economic reasons. She has not been wearing any compression stockings and has not taken any specific treatment for varicose veins for the last 3 years. 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 We have obtained some appointments  to see the vascular surgeons by the end of the month and the patient would like to follow up with me at my [facility] on Wednesday, June 29. 10/14/2014 -- she could not see me yesterday in [place] and hence has come for a review today. She has a vascular workup to be done this afternoon at [facility]. She is doing fine otherwise. 10/22/2014 -- she was seen by Dr. Brantley Fling and he has recommended surgical removal of her right saphenous vein from distal thigh to saphenofemoral odd junction and stab phlebectomy's of multiple large tributary branches throughout her thigh and calf. This would be done under general anesthesia in the outpatient setting. 10/29/2014 -- she is trying to work on a surgical date and in the meanwhile we have got insurance clearance for Apligraf and we will start this next week. 7/22 2016 -- she is here for the first application of Apligraf. 11/19/2014 -- she is here for a second application of Apligraf 11/26/2014 -- she has done fine after her last application of Apligraf and is awaiting her surgery which is scheduled for August 31. 12/03/2014 -- she is doing fine and is here for her third  application of Apligraf. 12/21/2014 -- She had surgery on 12/15/2014 by Dr.Early who did #1 ligation and stripping of right great saphenous vein from distal thigh to saphenofemoral junction, #2 stab phlebectomy of large tributary varicose veins in the thigh popliteal space and calf. She had an Ace wrap up to her groin and this was removed today and the Unna's boot was also removed. 12/28/2014 -- she is here for her fourth application of Apligraf. 01/06/2015 - he saw her vascular surgeon Dr. Arbie Cookey who was pleased with her progress and he has confirmed that no surgical procedures could be attempted on the left side. 01/13/2015 -- her wound looks very good and she's been having no problems whatsoever. Readmission: 07/26/2020 upon evaluation today patient presents for initial inspection here in our clinic for a new issue with her left leg although she is previously been seen due to issues with the right leg back in 2016. At that time she was seeing Dr. Arbie Cookey who is a vein/vascular specialist in [place]. He has since semiretired from what I understand. He is working out of [employer] I believe. Nonetheless she tells me at the time that there was really nothing to do for her left leg although the right leg was where they did most of the work. Subsequently she states that she is done fairly well until just in the past week where she had issues with bleeding from what appears to be varicose vein on the left leg medially. Unfortunately this has continued to be an issue although she tells me at first it was coming much more significantly Down quite a bit but nonetheless has not completely resolved. Every time she showers she notices that it starts to drain a little bit more. She does have a history of chronic venous insufficiency, lymphedema, varicose veins bilaterally, and obesity. 08/02/2020 upon evaluation today patient appears to be doing about the same in regards to the ulcer on her left leg. She has  some eschar covering there is definitely some fluid collecting underneath unfortunately. With that being said she tells me she is still having a tremendous amount of pain therefore she is really not able to allow me to clean this off very effectively to be perfectly honest. I think we need to try to soften this up 08/16/2020 upon evaluation today patient's  wound is really not doing significantly better not really states about the same. She notes that the wrap just does not seem to be staying up very well at all unfortunately. No fevers, chills, nausea, vomiting, or diarrhea. She did cut it off once it starts to slide in order to alleviate some of the pressure from sliding Down. Fortunately there is no signs of active infection at this time which is great news. 08/23/2020 upon evaluation today patient appears to be doing well 08/23/2020 upon evaluation today patient appears to be doing well with regard to her wound all things considered. Fortunately there does not appear to be any signs of active infection at this time which is great news. She has been tolerating the dressing changes without complication and overall I am extremely pleased with where things stand at this point. She does have her appointment with vascular in [facility] on June 9. 08/30/2020 upon evaluation today patient actually appears to be doing decently well in regard to her wound. Fortunately there is no signs of active infection which is great news. Nonetheless I do believe that the patient is going require little bit of debridement if she is okay with me attempting that today I think that will help clean off some of the necrotic tissue. Fortunately there does not appear to be otherwise any evidence of active infection which is also great news. 09/19/2020 upon evaluation today patient appears to be doing a little better in regard to her wound as compared to previous. Fortunately there does not appear to be any signs of active infection  overall. No fever chills noted. I do believe that the Iodosorb has made this a little bit better with regard to the overall size and appearance of the wound bed though again she does still have quite a ways to go to get this to heal she still very tender to touch. 09/27/2020 upon evaluation today patient appears to actually be doing quite well with regard to her wound. This is measuring much smaller which is great news. With that being said she did see vein and vascular in [facility] and they subsequently recommended that surgery is really what she probably needs to go forward with sounds like the potential for venous ablation. With that being said the patient tells me this is just not the right time for her to be able to proceed with any type of surgery which I completely understand. Nonetheless I do believe that she would continue to benefit from compression but again that is really not something that she is able to easily do. 10/04/2020 upon evaluation today patient appears to be doing about the same in regard to her wound. This is measuring a little bit smaller but nonetheless still is open and again has some slough and biofilm noted on the surface of the wound. There does not appear to be any signs of active infection which is great news. No fevers, chills, nausea, vomiting, or diarrhea. 10/04/2020 upon evaluation today patient appears to be doing well with regard to her wound. She has been tolerating dressing changes without complication. Fortunately there does not appear to be any signs of active infection which is great news. No fevers, chills, nausea, vomiting, or diarrhea. 10/25/2020 upon evaluation today patient appears to be doing well with regard to her wound. She has been tolerating the dressing changes without complication. Fortunately there is no signs of active infection at this time. No fevers, chills, nausea, vomiting, or diarrhea. SERRA, LIND ([id number])  [id number]  Page 8 of 11 11/01/2020 upon evaluation today patient with regard to her wound. She has been tolerating the dressing changes without complication. Fortunately there does not appear to be any signs of infection which is great news. No fever chills noted 11/15/2020 upon evaluation today patient appears to be doing well with regard to her wound. Fortunately there is no signs of active infection at this time. No fevers, chills, nausea, vomiting, or diarrhea. With that being said she continues to have a significant amount of pain at the site even though this is very close to complete closure. She also had several varicose veins around the area which were also problematic. Overall however I feel like the patient is making excellent progress. 11/28/2020 upon evaluation today patient appears to be doing well with regard to her leg ulcer. Again were not really able to debride or compression wrap her due to discomfort and pain. She does not allow for that. With that being said we have been using Iodosorb which does seem to be doing decently well. Fortunately there is no signs of active infection at this time which is great news. No fevers, chills, nausea, vomiting, or diarrhea. 8/31; patient presents for 2-week follow-up. She has been using Iodosorb. She reports that the wound is closed. She denies signs of infection. Readmission: 10-27-2021 upon evaluation this is a patient that presents today whom I have previously seen this is pretty much for the same issue though I think a little bit higher than the last time I saw her. She does have a history of chronic venous insufficiency and hypertension along with varicose veins. Subsequently she does have an ulceration which spontaneously ruptured she has not been wearing any compression which I think is a big part of the issue here. We discussed this before I really think she probably needs to be wearing her compression therapy, she  probably needs lymphedema pumps if she can ever wear the compression for a significant amount of time to get these, and subsequently also think that she needs to be elevating her legs is much as possible she may even need some vascular intervention in regard to her veins. All of this was reiterated and discussed with her today to reinforce what needs to happen in order to ensure that her legs do not get a lot worse. The patient voiced understanding. She tells me that she knows because she is seeing her mom go through a lot of this as well how bad things can get. 11-03-2021 upon evaluation today patient appears to be doing well with regard to her wound. Fortunately there does not appear to be any signs of active infection at this time. She is measuring a little bit bigger but I think this is because the wound is actually cleaning up a bit here. 7/27; left lateral leg wound not any smaller but perhaps with a cleaner surface. She is using Iodoflex to help with the latter and using Tubigrip. She has chronic venous insufficiency with secondary lymphedema. She is apparently followed by vein and vascular and is being scheduled for an ablation 11-17-2021 upon evaluation today patient appears to be doing well with regard to her wound this is actually showing signs of improvement which is great news. Fortunately I do not see any evidence of active infection locally or systemically at this time which is great news. No fevers, chills, nausea, vomiting, or diarrhea. 11-24-2021 upon evaluation today patient's wound is actually showing signs of significant improvement. Unfortunately she had quite a  bit of pain with debridement last week I do believe it was beneficial but at the same time she is doing much better but still really does not want this debrided again I think being that it is looking a whole lot better I would try to avoid that today especially since it caused her so much discomfort that is really not the goal  and I explained that to the patient today she voiced understanding and knows that it needed to be done but still states that it was quite painful. 11-30-2021 upon evaluation today patient appears to be doing excellent in regard to her wound this is actually showing signs of excellent improvement I am very pleased with where things stand. She does have her venous ablation appointment for October 17. 12-21-2021 upon evaluation today patient appears to be doing better in regard to her wound this is measuring smaller and looking better as well. Fortunately I do not see any signs of active infection locally or systemically which is great news. 01-01-2022 upon evaluation today patient appears to be doing well currently in regard to her wound. She is showing signs of improvement which is great news and overall I do not see any signs of active infection locally or systemically at this time. 01-08-2022 upon evaluation today patient appears to be doing well currently in regard to her wound she is actually showing signs of significant improvement which is great news. Fortunately I do not see any evidence of active infection locally or systemically at this time. I do believe that we are on the right track. She also has her appointment October 19 for the venous ablation. 01-16-2022 upon evaluation today patient's wound actually is showing some signs of improvement although this is very slow. Fortunately I do not see any evidence of infection at this time. The volume is a little bit more although the size is smaller I think this is due to the fact that we are slowly cleaning this area out effectively. 11/6 continued improvement the patient is using Iodoflex and a Tubigrip E. She was supposed to have venous surgery at vein and vascular in [place] however somehow this is gotten delayed till December 14. 02-27-2022 upon evaluation today patient's wound is actually showing signs of being completely healed. Fortunately I  do not see any evidence of active infection locally or systemically which is great news and overall I am extremely pleased with where we stand today. Unfortunately she does tell me that she postpone her venous ablation surgery until December 14 she tells me that she was not ready "financially" for this. 04/18/2022; Ms. Ailine Ruesga is a 37 year old female with a past medical history of venous insufficiency that presents the clinic for a left lower extremity wound. She was seen almost 2 months ago for the same wound. This had healed with Iodoflex and Tubigrip. She states that the wound recently reopened. She has seen vein and vascular and plan is for ligation and stripping of the left great saphenous vein. She is not sure when this procedure is going to be scheduled. She has canceled it once before. She has had office compression wraps in the past however these do not stay on and create more of an issue for her. She would like to avoid this. She currently denies signs of infection. 1/10; patient presents for follow-up. She has been using Iodoflex to the wound bed. She states she has been using her Tubigrip however she does not have this on today. She has no  issues or complaints today. She denies signs of infection. 1/17; patient presents for follow-up. She has not been using Iodoflex to the wound bed. She reports acute pain. She denies increased warmth, erythema or purulent drainage to the left lower extremity. She states she has been using Tubigrip. She has information to order the compression stockings but has not obtained them. 1/24; Patient had a wound culture done at last clinic visit that grew Proteus mirabilis and E. coli. She was prescribed levofloxacin and this should cover both bacteria. She has been taking the medication over the past week with improvement of symptoms to the wound bed. She has been using Hydrofera Blue under Tubigrip. She states the [facility] is sticking to her wound  bed. 1/31; patient presents for follow-up. She has been using Medihoney to the wound bed with improvement in healing. She has been contacted by [facility] to order her antibiotic ointment. She is not sure yet if she is able to afford this. She received her compression stockings but states they did not fit well. She is working on returning these. She has been using Tubigrip. 2/7; patient presents for follow-up. She is been using Medihoney to the wound bed. She obtained her compression stockings and has been using these daily. She states that the [facility] antibiotic ointment is arriving in the mail tomorrow. She has no issues or complaints today. She denies systemic signs of infection. 2/14; patient presents for follow-up. She has been using Keystone antibiotic ointment to the wound bed. She states the [facility] is sticking and she has stopped this. She is been using her compression stockings daily. She does not have these on today. [name], [name] ([id number]) [id number] Page 9 of 11 2/28; patient presents for follow-up. She continues to use [facility] antibiotic ointment to the wound bed. There is been improvement in healing. She has been using her compression stockings daily. 3/12; patient presents for follow-up. She has been using Keystone antibiotic ointment to the wound bed. She is not wearing her compression stockings today but states she does wear them daily. She declines debridement today. 3/20; patient presents for follow-up. She has been using Keystone antibiotic ointment with Hydrofera Blue. She states she is wearing her compression stockings daily but she does not have them on today. 3/27; patient presents for follow-up. She has been using Keystone antibiotic ointment and hydrofera blue to the wound bed. She reports wearing compression stockings. 4/10; patient presents for follow-up. She has been using Keystone antibiotic ointment and Hydrofera Blue to the  wound bed. She reports wearing compression stockings. She has no issues or complaints today. 4/24; patient presents for follow-up. She has been using Keystone antibiotic ointment and Hydrofera Blue to the wound bed. Overall there is healthier tissue today. She reports wearing her compression stockings daily. 5/8; patient comes into clinic today with outer stocking on the left leg and with no Keystone. She has small wounds on the medial left lower leg and has been using Keystone and [employer]	[place] at home. 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 She has chronic venous hypertension and stage III lymphedema. She came in today saying she did not want debridement and really was not interested in compression wraps saying they fall down consistently. She does not have compression pumps 6/20; patient has missed her last 3 follow-up appointments. She has been using Keystone antibiotic ointment and Hydrofera Blue. We discussed potentially doing an in office wrap and patient declined Today. She  states she will try the in office wrap next week. She denies signs of infection. 7/3; patient presents for follow-up. She missed her last clinic appointment. She declines doing the in office wrap today. She has been using Hydrofera Blue with antibiotic ointment. 8/14; patient has not been here since the beginning of July. She has wounds secondary to chronic venous insufficiency and lymphedema on the left medial lower leg. She has supposedly been using [facility]. She is covering this with some form of foam dressing. She is using compression stockings from elastic therapy which are probably 20/30 mmHg although we just do not seem to be able to identify that. She tells me she puts those on during the day and leaves them on all night and only changing them once a day. Objective Constitutional Patient is hypertensive.. Pulse regular and within target range for patient.Marland Kitchen Respirations  regular, non-labored and within target range.. Temperature is normal and within the target range for the patient.Marland Kitchen appears in no distress. Vitals Time Taken: 3:45 PM, Height: 66 in, Weight: 400 lbs, BMI: 64.6, Temperature: 98 F, Pulse: 112 bpm, Respiratory Rate: 18 breaths/min, Blood Pressure: 149/88 mmHg. General Notes: pt states wheezing last couple of weeks, pt states SOB quicker General Notes: Wound exam; left lower extremity 2 open wounds. The smaller one has a decent surface however the more posterior one is 100% covered in adherent slough. I used a #3 curette to remove this try to be as gentle as possible as she does not tolerate debridements. Her edema control in the left leg is marginal Integumentary (Hair, Skin) Wound #5 status is Open. Original cause of wound was Gradually Appeared. The date acquired was: 04/10/2022. The wound has been in treatment 32 weeks. The wound is located on the Left,Medial Lower Leg. The wound measures 1.5cm length x 3.5cm width x 0.2cm depth; 4.123cm^2 area and 0.825cm^3 volume. There is Fat Layer (Subcutaneous Tissue) exposed. There is no tunneling or undermining noted. There is a medium amount of serosanguineous drainage noted. There is medium (34-66%) red granulation within the wound bed. There is a medium (34-66%) amount of necrotic tissue within the wound bed including Adherent Slough. Assessment Active Problems ICD-10 Chronic venous hypertension (idiopathic) with ulcer of left lower extremity Non-pressure chronic ulcer of other part of left lower leg with fat layer exposed Lymphedema, not elsewhere classified Procedures Wound #5 Pre-procedure diagnosis of Wound #5 is a Venous Leg Ulcer located on the Left,Medial Lower Leg .Severity of Tissue Pre Debridement is: Fat layer exposed. There was a Excisional Skin/Subcutaneous Tissue Debridement with a total area of 4.12 sq cm performed by Maxwell Caul, MD. With the following [name], [name]  ([id number]) [id number] Page 10 of 11 instrument(s): Curette to remove Viable and Non-Viable tissue/material. Material removed includes Subcutaneous Tissue and Slough and after achieving pain control using Lidocaine 4% T opical Solution. No specimens were taken. A time out was conducted at 16:01, prior to the start of the procedure. A Moderate amount of bleeding was controlled with Pressure. The procedure was tolerated well. Post Debridement Measurements: 1.5cm length x 3.5cm width x 0.2cm depth; 0.825cm^3 volume. Character of Wound/Ulcer Post Debridement is stable. Severity of Tissue Post Debridement is: Fat layer exposed. Post procedure Diagnosis Wound #5: Same as Pre-Procedure Plan Follow-up Appointments: Wound #5 Left,Medial Lower Leg: Return Appointment in 1 week. Bathing/ Shower/ Hygiene: Wash wounds with antibacterial soap and water. May shower with wound dressing protected with water repellent cover or cast protector. No tub bath.  Anesthetic (Use 'Patient Medications' Section for Anesthetic Order Entry): Lidocaine applied to wound bed Edema Control - Lymphedema / Segmental Compressive Device / Other: Wound #5 Left,Medial Lower Leg: Patient to wear own compression stockings. Remove compression stockings every night before going to bed and put on every morning when getting up. - LLE, per Leanord Hawking recommended patient re order 30/40 compression stockings, patient given up to date measurements for left lower extremity. Elevate, Exercise Daily and Avoid Standing for Long Periods of Time. Elevate legs to the level of the heart and pump ankles as often as possible Elevate leg(s) parallel to the floor when sitting. Medications-Please add to medication list.: Wound #5 Left,Medial Lower Leg: Keystone Compound - per [employer] no longer needed WOUND #5: - Lower Leg Wound Laterality: Left, Medial Cleanser: [place]ram Ancillary Kit - 15 Day Supply (Generic) 1 x Per  Day/30 Days Discharge Instructions: Use supplies as instructed; Kit contains: (15) Saline Bullets; (15) 3x3 Gauze; 15 pr Gloves Cleanser: Soap and Water 1 x Per Day/30 Days Discharge Instructions: Gently cleanse wound with antibacterial soap, rinse and pat dry prior to dressing wounds Cleanser: Vashe 5.8 (oz) 1 x Per Day/30 Days Discharge Instructions: Use vashe 5.8 (oz) as directed Peri-Wound Care: AandD Ointment 1 x Per Day/30 Days Discharge Instructions: Apply AandD Ointment as directed around edges to help dressing from sticking Topical: Gentamicin 1 x Per Day/30 Days Discharge Instructions: IN OFFICE ONLY Apply as directed by provider. Topical: Mupirocin Ointment 1 x Per Day/30 Days Discharge Instructions: IN OFFICE ONLY Apply as directed by provider. Prim Dressing: Hydrofera Blue Ready Transfer Foam, 2.5x2.5 (in/in) (DME) (Generic) 1 x Per Day/30 Days ary Discharge Instructions: Apply Hydrofera Blue Ready to wound bed as directed cut to fit wound bed Secondary Dressing: (BORDER) Zetuvit Plus SILICONE BORDER Dressing 4x4 (in/in) (DME) (Generic) 1 x Per Day/30 Days Discharge Instructions: Please do not put silicone bordered dressings under wraps. Use non-bordered dressing only. 1. I think she should continue the Hydrofera Blue foam dressings. At this point I do not know about the need for topical antibiotics I did not reorder the [place]. 2. She apparently does not tolerate compression wraps. She is going to reorder stockings from elastic therapy. If she can tolerate 30/40 stockings and she can get them on that is probably a good thing. I suspect she has 20/30 stockings now. 3. Unfortunately the real problem here is excess edema which I think can only be managed by compression wraps. She did not seem interested in pursuing this Electronic Signature(s) Signed: 11/28/2022 4:20:15 PM By: Baltazar Najjar MD Entered By: Baltazar Najjar on 11/28/2022  16:17:27 -------------------------------------------------------------------------------- SuperBill Details Patient Name: Date of Service: [name]. 11/28/2022 Medical Record Number: [id number] Patient Account Number: [id number] Date of Birth/Sex: Treating RN: [birth year] (37 y.o. [name] Primary Care Provider: [name] Other Clinician: Conley [name] ([id number]) [id number] Page 11 of 11 Referring Provider: Treating Provider/Extender: RO BSO N, MICHA EL Caleb Popp in Treatment: 32 Diagnosis Coding ICD-10 Codes Code Description I87.312 Chronic venous hypertension (idiopathic) with ulcer of left lower extremity L97.822 Non-pressure chronic ulcer of other part of left lower leg with fat layer exposed I89.0 Lymphedema, not elsewhere classified Facility Procedures : CPT4 Code: 36644034 Description: 11042 - DEB SUBQ TISSUE 20 SQ CM/< ICD-10 Diagnosis Description I87.312 Chronic venous hypertension (idiopathic) with ulcer of left lower extremity L97.822 Non-pressure chronic ulcer of other part of left lower leg with fat layer expo I89.0  Lymphedema, not elsewhere classified  Modifier: sed Quantity: 1 Physician Procedures : CPT4 Code Description Modifier [id number] 11042 - WC PHYS SUBQ TISS 20 SQ CM ICD-10 Diagnosis Description I87.312 Chronic venous hypertension (idiopathic) with ulcer of left lower extremity L97.822 Non-pressure chronic ulcer of other part of left lower  leg with fat layer exposed I89.0 Lymphedema, not elsewhere classified Quantity: 1 Electronic Signature(s) Signed: 11/28/2022 4:20:15 PM By: Baltazar Najjar MD Entered By: Baltazar Najjar on 11/28/2022 16:17:45

## 2022-12-05 ENCOUNTER — Ambulatory Visit: Payer: BC Managed Care – PPO | Admitting: Internal Medicine

## 2022-12-13 ENCOUNTER — Ambulatory Visit (INDEPENDENT_AMBULATORY_CARE_PROVIDER_SITE_OTHER): Payer: BC Managed Care – PPO | Admitting: Student

## 2022-12-13 ENCOUNTER — Other Ambulatory Visit (HOSPITAL_COMMUNITY)
Admission: RE | Admit: 2022-12-13 | Discharge: 2022-12-13 | Disposition: A | Discharge status: Home / Self Care | Payer: BC Managed Care – PPO | Source: Ambulatory Visit | Attending: Family Medicine | Admitting: Family Medicine

## 2022-12-13 VITALS — BP 123/74 | HR 93 | Ht 66.0 in | Wt >= 6400 oz

## 2022-12-13 DIAGNOSIS — F17200 Nicotine dependence, unspecified, uncomplicated: Secondary | ICD-10-CM | POA: Insufficient documentation

## 2022-12-13 DIAGNOSIS — Z113 Encounter for screening for infections with a predominantly sexual mode of transmission: Secondary | ICD-10-CM | POA: Insufficient documentation

## 2022-12-13 DIAGNOSIS — Z3009 Encounter for other general counseling and advice on contraception: Secondary | ICD-10-CM | POA: Insufficient documentation

## 2022-12-13 DIAGNOSIS — Z124 Encounter for screening for malignant neoplasm of cervix: Secondary | ICD-10-CM

## 2022-12-13 NOTE — Assessment & Plan Note (Signed)
Smokes marijuana daily. In clinic, normal work of breathing and speaking full sentences without any evidence of increased work of breathing. Motivated to quit. Given dyspnea requiring use of albuterol inhaler, scheduled her an appointment with Dr. Raymondo Band to have PFTs and discussed smoking cessation.

## 2022-12-13 NOTE — Patient Instructions (Addendum)
It was great seeing you today.  As we discussed, -We completed your Pap smear and other testing today.  I will call you if anything is abnormal or needs treatment.  Otherwise, I will send you a MyChart message. -Please come to your scheduled appointment with Dr. Raymondo Band to have your lung function testing and/or to discuss smoking cessation.  Think about the contraceptive options we discussed today.  If you decide would like to pursue an IUD, Nexplanon, or resume using the Depo shots, to schedule an appointment with your primary doctor and we can get you set up for this.   If you have any questions or concerns, please feel free to call the clinic.   Have a wonderful day,  Dr. Darral Dash [facility] [phone]

## 2022-12-13 NOTE — Assessment & Plan Note (Addendum)
Pap cytology completed today. Also added GC/chlamydia, and wet prep given vaginal irritation. Will follow-up on results and advise patient.

## 2022-12-13 NOTE — Assessment & Plan Note (Signed)
Discussed contraception options including IUD, Nexplanon, Depo-Provera, OCP. Stressed importance of this to prevent unwanted pregnancy. Provided handout with all of the above options and patient will think about it.  Declined any contraception today. Continue to use condoms as barrier methods. Follow-up with PCP and continue discussion.

## 2022-12-13 NOTE — Progress Notes (Signed)
    SUBJECTIVE:   CHIEF COMPLAINT / HPI:   [name] is a 37 year old female here to discuss vaginal irritation.  Vaginal irritation She denies any changes in discharge.  She has had some irritation, itching. She is sexually active with men only Has irregular periods, sometimes every 6 months.  At times they are very heavy. She says she does not know if she can get pregnant, and I asked her should she become pregnant if she would want to continue with the pregnancy, which she was unsure of.  She is also due for her Pap smear.  Last Pap cytology was July 21, 2019, with negative high-risk HPV, NILM.  She was positive for trichomonas at that time.  Substance use Smokes marijuana daily since 15.  Reports that she would like to quit smoking. Uses an albuterol inhaler and has to use it quite frequently. Sometimes has shortness of breath.  PERTINENT  PMH / PSH: Obesity, hypertension  OBJECTIVE:   BP 123/74   Pulse 93   Ht 5\' 6"  (1.676 m)   Wt (!) 420 lb 3.2 oz (190.6 kg)   SpO2 100%   BMI 67.82 kg/m    General: Pleasant, well-appearing, obese Respiratory: Normal work of breathing on room air GU: Gilberto Better, CMA present as chaperone for exam.  External vulva and vagina nonerythematous, without any obvious lesions or rash. Yellowish tinted discharge appreciated.  Normal ruggae of vaginal walls.  Cervix is non erythematous and non-friable.  There is no cervical motion tenderness, masses or gross abnormalities appreciated during bimanual exam.   ASSESSMENT/PLAN:   Pap smear for cervical cancer screening Pap cytology completed today. Also added GC/chlamydia, and wet prep given vaginal irritation. Will follow-up on results and advise patient.  Smoker Smokes marijuana daily. In clinic, normal work of breathing and speaking full sentences without any evidence of increased work of breathing. Motivated to quit. Given dyspnea requiring use of albuterol inhaler, scheduled her  an appointment with Dr. Raymondo Band to have PFTs and discussed smoking cessation.     Darral Dash, DO [facility]

## 2022-12-18 ENCOUNTER — Other Ambulatory Visit: Payer: Self-pay | Admitting: Student

## 2022-12-18 ENCOUNTER — Ambulatory Visit: Payer: BC Managed Care – PPO | Admitting: Pharmacist

## 2022-12-18 ENCOUNTER — Telehealth: Payer: Self-pay

## 2022-12-18 MED ORDER — METRONIDAZOLE 500 MG PO TABS: 500.0000 mg | ORAL_TABLET | Freq: Two times a day (BID) | ORAL | 0 refills | Status: DC

## 2022-12-18 NOTE — Progress Notes (Signed)
Metronidazole 500 mg BID x7 days for treatment of Trichomonas STI sent to pharmacy. Patient should abstain from sexual intercourse from now until 1 week post-completion of medication. Partner needs treatment as well to prevent re-infection.  Tried to call patient to discuss results with no answer. Left HIPAA complaint voicemail to return call to clinic

## 2022-12-18 NOTE — Telephone Encounter (Signed)
Patient calls nurse line regarding results. Verified patient's name and DOB.   Advised of message per Dr. Melissa Noon. Patient will pick up prescription from pharmacy.   Patient has no further questions at this time.   Veronda Prude, RN

## 2022-12-19 ENCOUNTER — Encounter: Payer: BC Managed Care – PPO | Attending: Internal Medicine | Admitting: Internal Medicine

## 2022-12-19 DIAGNOSIS — I872 Venous insufficiency (chronic) (peripheral): Secondary | ICD-10-CM | POA: Insufficient documentation

## 2022-12-19 DIAGNOSIS — I87312 Chronic venous hypertension (idiopathic) with ulcer of left lower extremity: Secondary | ICD-10-CM

## 2022-12-19 DIAGNOSIS — I10 Essential (primary) hypertension: Secondary | ICD-10-CM | POA: Insufficient documentation

## 2022-12-19 DIAGNOSIS — I890 Lymphedema, not elsewhere classified: Secondary | ICD-10-CM | POA: Diagnosis not present

## 2022-12-19 DIAGNOSIS — L97822 Non-pressure chronic ulcer of other part of left lower leg with fat layer exposed: Secondary | ICD-10-CM

## 2022-12-20 LAB — CYTOLOGY - PAP
Adequacy: ABSENT
Chlamydia: NEGATIVE
Comment: NEGATIVE
Comment: NEGATIVE
Comment: NEGATIVE
Comment: NORMAL
Diagnosis: NEGATIVE
High risk HPV: NEGATIVE
Neisseria Gonorrhea: NEGATIVE
Trichomonas: POSITIVE — AB

## 2022-12-20 NOTE — Progress Notes (Signed)
ZELTZIN, KAZEMI ([id number]) [id number] Page 1 of 7 Visit Report for 12/19/2022 Arrival Information Details Patient Name: Date of Service: [name], [name] [place]. 12/19/2022 3:30 PM Medical Record Number: [id number] Patient Account Number: [id number] Date of Birth/Sex: Treating RN: [birth year] (37 y.o. Freddy Finner Primary Care Jackston [place]: [name] Other Clinician: [name] Referring Roxine Whittinghill: Treating Josselyne Onofrio/Extender: Emeline Darling in Treatment: 35 Visit Information History Since Last Visit All ordered tests and consults were completed: No Patient Arrived: Ambulatory Added or deleted any medications: No Arrival Time: 15:47 Any new allergies or adverse reactions: No Transfer Assistance: None Had a fall or experienced change in No Patient Identification Verified: Yes activities of daily living that may affect Secondary Verification Process Completed: Yes risk of falls: Patient Requires Transmission-Based Precautions: No Signs or symptoms of abuse/neglect since last visito No Patient Has Alerts: No Hospitalized since last visit: No Implantable device outside of the clinic excluding No cellular tissue based products placed in the center since last visit: Has Dressing in Place as Prescribed: Yes Has Compression in Place as Prescribed: Yes Pain Present Now: Yes Electronic Signature(s) Signed: 12/19/2022 4:49:18 PM By: [name] Entered By: [name] on 12/19/2022 15:48:11 -------------------------------------------------------------------------------- Encounter Discharge Information Details Patient Name: Date of Service: [name] Moody MA J. 12/19/2022 3:30 PM Medical Record Number: [id number] Patient Account Number: [id number] Date of Birth/Sex: Treating RN: Dec 02, 1985 (37 y.o. Freddy Finner Primary Care Denis Koppel: [name] Other Clinician: [name] Referring Undra Harriman: Treating Latrell Reitan/Extender:  Emeline Darling in Treatment: 35 Encounter Discharge Information Items Post Procedure Vitals Discharge Condition: Stable Temperature (F): 98.6 Ambulatory Status: Ambulatory Pulse (bpm): 107 Discharge Destination: Home Respiratory Rate (breaths/min): 18 Transportation: Private Auto Blood Pressure (mmHg): 110/61 Accompanied By: self Schedule Follow-up Appointment: RODA, TRENTMAN ([id number]) [id number] Page 2 of 7 Clinical Summary of Care: Electronic Signature(s) Signed: 12/19/2022 4:49:18 PM By: [name] Entered By: [name] on 12/19/2022 16:17:17 -------------------------------------------------------------------------------- Lower Extremity Assessment Details Patient Name: Date of Service: [name], IIDA MA J. 12/19/2022 3:30 PM Medical Record Number: [id number] Patient Account Number: [id number] Date of Birth/Sex: Treating RN: Oct 07, 1985 (37 y.o. Freddy Finner Primary Care Deontez Klinke: [name] Other Clinician: [name] Referring Janiesha Diehl: Treating Swayze Pries/Extender: Emeline Darling in Treatment: 35 Edema Assessment Assessed: [name] Yes] [name]: No] Edema: [Left: Ye] [Right: s] Calf Left: Right: Point of Measurement: 32 cm From Medial Instep 60 cm Ankle Left: Right: Point of Measurement: 12 cm From Medial Instep 32.5 cm Vascular Assessment Pulses: Dorsalis Pedis Palpable: [Left:Yes] Toe Nail Assessment Left: Right: Thick: No Discolored: No Deformed: No Improper Length and Hygiene: No Electronic Signature(s) Signed: 12/19/2022 4:49:18 PM By: [name] Signed: 12/20/2022 1:40:54 PM By: Yevonne Pax RN Entered By: [name] on 12/19/2022 15:58:18 Bothun, [name] ([id number]) [id number] Page 3 of 7 -------------------------------------------------------------------------------- Multi Wound Chart Details Patient Name: Date of  Service: [name], [name] [place]. 12/19/2022 3:30 PM Medical Record Number: [id number] Patient Account Number: [id number] Date of Birth/Sex: Treating RN: 11/07/1985 (37 y.o. Freddy Finner Primary Care Amro Winebarger: [name] Other Clinician: [name] Referring Maelee Hoot: Treating Nareg Breighner/Extender: Emeline Darling in Treatment: 35 Vital Signs Height(in): 66 Pulse(bpm): 107 Weight(lbs): 400 Blood Pressure(mmHg): 110/61 Body Mass Index(BMI): 64.6 Temperature(F): 98.6 Respiratory Rate(breaths/min): 18 [5:Photos:] [N/A:N/A] Left, Medial Lower Leg N/A N/A Wound Location: Gradually Appeared N/A N/A Wounding Event: Venous Leg Ulcer N/A N/A Primary Etiology: Hypertension, Peripheral Venous N/A N/A Comorbid History: Disease 04/10/2022 N/A N/A Date Acquired: 70  N/A N/A Weeks of Treatment: Open N/A N/A Wound Status: No N/A N/A Wound Recurrence: 1.5x3.5x0.2 N/A N/A Measurements L x W x D (cm) 4.123 N/A N/A A (cm) : rea 0.825 N/A N/A Volume (cm) : 25.00% N/A N/A % Reduction in Area: 25.00% N/A N/A % Reduction in Volume: Full Thickness Without Exposed N/A N/A Classification: Support Structures Medium N/A N/A Exudate Amount: Serosanguineous N/A N/A Exudate Type: red, brown N/A N/A Exudate Color: Medium (34-66%) N/A N/A Granulation Amount: Red N/A N/A Granulation Quality: Medium (34-66%) N/A N/A Necrotic Amount: Fat Layer (Subcutaneous Tissue): Yes N/A N/A Exposed Structures: Fascia: No Tendon: No Muscle: No Joint: No Bone: No Small (1-33%) N/A N/A Epithelialization: Treatment Notes Electronic Signature(s) Signed: 12/19/2022 4:49:18 PM By: [name] Entered By: [name] on 12/19/2022 15:58:33 Crill, [name] ([id number]) [id number] Page 4 of 7 -------------------------------------------------------------------------------- Multi-Disciplinary Care Plan Details Patient Name: Date of  Service: [name], [name] [place]. 12/19/2022 3:30 PM Medical Record Number: [id number] Patient Account Number: [id number] Date of Birth/Sex: Treating RN: 1986/01/12 (37 y.o. Freddy Finner Primary Care Jonah Nestle: [name] Other Clinician: [name] Referring Ladean Steinmeyer: Treating Zeya Balles/Extender: Emeline Darling in Treatment: 35 Active Inactive Wound/Skin Impairment Nursing Diagnoses: Knowledge deficit related to ulceration/compromised skin integrity Goals: Patient/caregiver will verbalize understanding of skin care regimen Date Initiated: 04/18/2022 Target Resolution Date: 07/18/2022 Goal Status: Active Ulcer/skin breakdown will have a volume reduction of 30% by week 4 Date Initiated: 04/18/2022 Date Inactivated: 07/04/2022 Target Resolution Date: 05/19/2022 Goal Status: Unmet Unmet Reason: comorbidities Ulcer/skin breakdown will have a volume reduction of 50% by week 8 Date Initiated: 04/18/2022 Date Inactivated: 07/04/2022 Target Resolution Date: 06/18/2022 Goal Status: Unmet Unmet Reason: comorbidities Ulcer/skin breakdown will have a volume reduction of 80% by week 12 Date Initiated: 04/18/2022 Target Resolution Date: 07/18/2022 Goal Status: Active Ulcer/skin breakdown will heal within 14 weeks Date Initiated: 04/18/2022 Target Resolution Date: 08/17/2022 Goal Status: Active Interventions: Assess patient/caregiver ability to obtain necessary supplies Assess patient/caregiver ability to perform ulcer/skin care regimen upon admission and as needed Assess ulceration(s) every visit Notes: Electronic Signature(s) Signed: 12/19/2022 4:49:18 PM By: [name] Signed: 12/20/2022 1:40:54 PM By: Yevonne Pax RN Entered By: [name] on 12/19/2022 16:15:35 -------------------------------------------------------------------------------- Pain Assessment Details Patient Name: Date of Service: [name] Moody MA J. 12/19/2022 3:30 PM [name] ([id number])  [id number] Page 5 of 7 Medical Record Number: [id number] Patient Account Number: [id number] Date of Birth/Sex: Treating RN: 1985-11-11 (37 y.o. Freddy Finner Primary Care Ladean Steinmeyer: [name] Other Clinician: [name] Referring Ayoub Arey: Treating Janelys Glassner/Extender: Emeline Darling in Treatment: 35 Active Problems Location of Pain Severity and Description of Pain Patient Has Paino Yes Site Locations Pain Location: Pain in Ulcers Duration of the Pain. Constant / Intermittento Intermittent Rate the pain. Current Pain Level: 5 Character of Pain Describe the Pain: Burning, Sharp, Shooting, Stabbing, Throbbing Pain Management and Medication Current Pain Management: Medication: Yes Cold Application: No Rest: Yes Massage: No Activity: No T.E.N.S.: No Heat Application: No Leg drop or elevation: No Is the Current Pain Management Adequate: Inadequate How does your wound impact your activities of daily livingo Sleep: No Bathing: No Appetite: No Relationship With Others: No Bladder Continence: No Emotions: No Bowel Continence: No Work: No Toileting: No Drive: No Dressing: No Hobbies: No Electronic Signature(s) Signed: 12/19/2022 4:49:18 PM By: [name] Signed: 12/20/2022 1:40:54 PM By: Yevonne Pax RN Entered By: [name] on 12/19/2022 15:55:31 -------------------------------------------------------------------------------- Patient/Caregiver Education Details Patient Name: Date of Service: [name],  SHA MA J. 9/4/2024andnbsp3:30 PM Medical Record Number: [id number] Patient Account Number: [id number] Date of Birth/Gender: Treating RN: 07-Feb-1986 (37 y.o. Freddy Finner Primary Care Physician: [name] Other Clinician: [name] Referring Physician: Treating Physician/Extender: Emeline Darling in Treatment: [address], [place] J ([id number])  [id number] Page 6 of 7 Education Assessment Education Provided To: Patient Education Topics Provided Wound/Skin Impairment: Handouts: Other: continue wound care as directed Methods: Explain/Verbal Responses: State content correctly Electronic Signature(s) Signed: 12/19/2022 4:49:18 PM By: [name] Entered By: [name] on 12/19/2022 16:15:52 -------------------------------------------------------------------------------- Wound Assessment Details Patient Name: Date of Service: [name] Moody MA J. 12/19/2022 3:30 PM Medical Record Number: [id number] Patient Account Number: [id number] Date of Birth/Sex: Treating RN: [birth year] (37 y.o. Freddy Finner Primary Care Dashon Mcintire: [name] Other Clinician: [name] Referring Athelene Hursey: Treating Cristie Mckinney/Extender: Emeline Darling in Treatment: 35 Wound Status Wound Number: 5 Primary Etiology: Venous Leg Ulcer Wound Location: Left, Medial Lower Leg Wound Status: Open Wounding Event: Gradually Appeared Comorbid History: Hypertension, Peripheral Venous Disease Date Acquired: 04/10/2022 Weeks Of Treatment: 35 Clustered Wound: No Photos Wound Measurements Length: (cm) 1.5 Width: (cm) 3.5 Depth: (cm) 0.2 Area: (cm) 4.123 Volume: (cm) 0.825 % Reduction in Area: 25% % Reduction in Volume: 25% Epithelialization: Small (1-33%) Wound Description Classification: Full Thickness Without Exposed Support Structures Exudate Amount: Medium [name], [name] ([id number]) Exudate Type: Serosanguineous Exudate Color: red, brown Foul Odor After Cleansing: No Slough/Fibrino Yes [phone].pdf Page 7 of 7 Wound Bed Granulation Amount: Medium (34-66%) Exposed Structure Granulation Quality: Red Fascia Exposed: No Necrotic Amount: Medium (34-66%) Fat Layer (Subcutaneous Tissue) Exposed: Yes Necrotic Quality: Adherent Slough Tendon Exposed: No Muscle  Exposed: No Joint Exposed: No Bone Exposed: No Treatment Notes Wound #5 (Lower Leg) Wound Laterality: Left, Medial Cleanser Peri-Wound Care Topical Primary Dressing Secondary Dressing Secured With Compression Wrap Compression Stockings Add-Ons Electronic Signature(s) Signed: 12/19/2022 4:49:18 PM By: [name] Signed: 12/20/2022 1:40:54 PM By: Yevonne Pax RN Entered By: [name] on 12/19/2022 15:57:14 -------------------------------------------------------------------------------- Vitals Details Patient Name: Date of Service: [name] Moody MA J. 12/19/2022 3:30 PM Medical Record Number: [id number] Patient Account Number: [id number] Date of Birth/Sex: Treating RN: [birth year] (37 y.o. Freddy Finner Primary Care Keighan Amezcua: [name] Other Clinician: [name] Referring Ihsan Nomura: Treating Bowie Delia/Extender: Emeline Darling in Treatment: 35 Vital Signs Time Taken: 15:49 Temperature (F): 98.6 Height (in): 66 Pulse (bpm): 107 Weight (lbs): 400 Respiratory Rate (breaths/min): 18 Body Mass Index (BMI): 64.6 Blood Pressure (mmHg): 110/61 Reference Range: 80 - 120 mg / dl Electronic Signature(s) Signed: 12/19/2022 4:49:18 PM By: [name] Entered By: [name] on 12/19/2022 15:51:50

## 2022-12-20 NOTE — Progress Notes (Signed)
[name], [name] ([id number]) [id number] Page 1 of 11 Visit Report for 12/19/2022 Chief Complaint Document Details Patient Name: Date of Service: [name] [place]. 12/19/2022 3:30 PM Medical Record Number: [id number] Patient Account Number: [id number] Date of Birth/Sex: Treating RN: 1985/09/28 (37 y.o. Freddy Finner Primary Care Provider: [name] Other Clinician: [name] Referring Provider: Treating Provider/Extender: Emeline Darling in Treatment: 35 Information Obtained from: Patient Chief Complaint 04/18/2022; Left LE Ulcer Electronic Signature(s) Signed: 12/19/2022 4:18:13 PM By: Geralyn Corwin DO Entered By: Geralyn Corwin on 12/19/2022 16:11:27 -------------------------------------------------------------------------------- Debridement Details Patient Name: Date of Service: [name] Moody MA J. 12/19/2022 3:30 PM Medical Record Number: [id number] Patient Account Number: [id number] Date of Birth/Sex: Treating RN: 06-01-1985 (37 y.o. Freddy Finner Primary Care Provider: [name] Other Clinician: [name] Referring Provider: Treating Provider/Extender: Emeline Darling in Treatment: 35 Debridement Performed for Assessment: Wound #5 Left,Medial Lower Leg Performed By: Physician Geralyn Corwin, MD Debridement Type: Debridement Severity of Tissue Pre Debridement: Fat layer exposed Level of Consciousness (Pre-procedure): Awake and Alert Pre-procedure Verification/Time Out Yes - 16:03 Taken: Start Time: 16:03 Percent of Wound Bed Debrided: 100% T Area Debrided (cm): otal 4.12 Tissue and other material debrided: Viable, Non-Viable, Slough, Slough Level: Non-Viable Tissue Debridement Description: Selective/Open Wound Instrument: Curette Bleeding: Minimum Hemostasis Achieved: Pressure Response to Treatment: Procedure was tolerated well Level of Consciousness (Post- Awake  and Alert procedure): [name] ([id number]) [id number] Page 2 of 11 Post Debridement Measurements of Total Wound Length: (cm) 1.5 Width: (cm) 3.5 Depth: (cm) 0.2 Volume: (cm) 0.825 Character of Wound/Ulcer Post Debridement: Stable Severity of Tissue Post Debridement: Fat layer exposed Post Procedure Diagnosis Same as Pre-procedure Electronic Signature(s) Signed: 12/19/2022 4:18:13 PM By: Geralyn Corwin DO Signed: 12/19/2022 4:49:18 PM By: [name] Signed: 12/20/2022 1:40:54 PM By: Yevonne Pax RN Entered By: [name] on 12/19/2022 16:04:23 -------------------------------------------------------------------------------- HPI Details Patient Name: Date of Service: [name] Moody MA J. 12/19/2022 3:30 PM Medical Record Number: [id number] Patient Account Number: [id number] Date of Birth/Sex: Treating RN: [birth year] (37 y.o. Freddy Finner Primary Care Provider: [name] Other Clinician: [name] Referring Provider: Treating Provider/Extender: Emeline Darling in Treatment: 35 History of Present Illness HPI Description: 37 year old patient who started with having ulcerations on the right lower leg on the lateral part of her ankle for about 2 weeks. She was seen in the ER at [facility] and advised to see the wound care for a consultation. No X-rays of workup was done during the ER visit and no prescription for any medications of compression wraps were given. the patient is not diabetic but does have hypertension and her medications have been reviewed by me. 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 The right and left greater saphenous vein was found to be tortuous. Deep venous system was also not competent and there was reflux of more than 500  ms. She was then seen by Dr. Karie Schwalbe Early who recommended that the patient would not benefit from endovenous ablation and he had recommended vein stripping odd on the right side and multiple small phlebectomy procedures on the left side. the patient did not follow-up due to social economic reasons. She has not been wearing any compression stockings and has not taken any specific treatment for varicose veins for the  last 3 years. 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 We have obtained some appointments to see the vascular surgeons by the end of the month and the patient would like to follow up with me at my [facility] on Wednesday, June 29. 10/14/2014 -- she could not see me yesterday in [place] and hence has come for a review today. She has a vascular workup to be done this afternoon at [facility]. She is doing fine otherwise. 10/22/2014 -- she was seen by Dr. Brantley Fling and he has recommended surgical removal of her right saphenous vein from distal thigh to saphenofemoral odd junction and stab phlebectomy's of multiple large tributary branches throughout her thigh and calf. This would be done under general anesthesia in the outpatient setting. 10/29/2014 -- she is trying to work on a surgical date and in the meanwhile we have got insurance clearance for Apligraf and we will start this next week. 7/22 2016 -- she is here for the first application of Apligraf. 11/19/2014 -- she is here for a second application of Apligraf 11/26/2014 -- she has done fine after her last application of Apligraf and is awaiting her surgery which is scheduled for August 31. 12/03/2014 -- she is doing fine and is here for her third application of Apligraf. 12/21/2014 -- She had surgery on 12/15/2014 by Dr.Early who did #1 ligation and stripping of right great saphenous vein from distal thigh to  saphenofemoral junction, #2 stab phlebectomy of large tributary varicose veins in the thigh popliteal space and calf. She had an Ace wrap up to her groin and this was removed today and the Unna's boot was also removed. 12/28/2014 -- she is here for her fourth application of Apligraf. 01/06/2015 - he saw her vascular surgeon Dr. Arbie Cookey who was pleased with her progress and he has confirmed that no surgical procedures could be attempted on the left side. 01/13/2015 -- her wound looks very good and she's been having no problems whatsoever. [name], [name] ([id number]) [id number] Page 3 of 11 Readmission: 07/26/2020 upon evaluation today patient presents for initial inspection here in our clinic for a new issue with her left leg although she is previously been seen due to issues with the right leg back in 2016. At that time she was seeing Dr. Arbie Cookey who is a vein/vascular specialist in [place]. He has since semiretired from what I understand. He is working out of [employer] I believe. Nonetheless she tells me at the time that there was really nothing to do for her left leg although the right leg was where they did most of the work. Subsequently she states that she is done fairly well until just in the past week where she had issues with bleeding from what appears to be varicose vein on the left leg medially. Unfortunately this has continued to be an issue although she tells me at first it was coming much more significantly Down quite a bit but nonetheless has not completely resolved. Every time she showers she notices that it starts to drain a little bit more. She does have a history of chronic venous insufficiency, lymphedema, varicose veins bilaterally, and obesity. 08/02/2020 upon evaluation today patient appears to be doing about the same in regards to the ulcer on her left leg. She has some eschar covering there is definitely some fluid collecting underneath  unfortunately. With that being said she tells me she  is still having a tremendous amount of pain therefore she is really not able to allow me to clean this off very effectively to be perfectly honest. I think we need to try to soften this up 08/16/2020 upon evaluation today patient's wound is really not doing significantly better not really states about the same. She notes that the wrap just does not seem to be staying up very well at all unfortunately. No fevers, chills, nausea, vomiting, or diarrhea. She did cut it off once it starts to slide in order to alleviate some of the pressure from sliding Down. Fortunately there is no signs of active infection at this time which is great news. 08/23/2020 upon evaluation today patient appears to be doing well 08/23/2020 upon evaluation today patient appears to be doing well with regard to her wound all things considered. Fortunately there does not appear to be any signs of active infection at this time which is great news. She has been tolerating the dressing changes without complication and overall I am extremely pleased with where things stand at this point. She does have her appointment with vascular in [facility] on June 9. 08/30/2020 upon evaluation today patient actually appears to be doing decently well in regard to her wound. Fortunately there is no signs of active infection which is great news. Nonetheless I do believe that the patient is going require little bit of debridement if she is okay with me attempting that today I think that will help clean off some of the necrotic tissue. Fortunately there does not appear to be otherwise any evidence of active infection which is also great news. 09/19/2020 upon evaluation today patient appears to be doing a little better in regard to her wound as compared to previous. Fortunately there does not appear to be any signs of active infection overall. No fever chills noted. I do believe that the Iodosorb has made this a  little bit better with regard to the overall size and appearance of the wound bed though again she does still have quite a ways to go to get this to heal she still very tender to touch. 09/27/2020 upon evaluation today patient appears to actually be doing quite well with regard to her wound. This is measuring much smaller which is great news. With that being said she did see vein and vascular in [facility] and they subsequently recommended that surgery is really what she probably needs to go forward with sounds like the potential for venous ablation. With that being said the patient tells me this is just not the right time for her to be able to proceed with any type of surgery which I completely understand. Nonetheless I do believe that she would continue to benefit from compression but again that is really not something that she is able to easily do. 10/04/2020 upon evaluation today patient appears to be doing about the same in regard to her wound. This is measuring a little bit smaller but nonetheless still is open and again has some slough and biofilm noted on the surface of the wound. There does not appear to be any signs of active infection which is great news. No fevers, chills, nausea, vomiting, or diarrhea. 10/04/2020 upon evaluation today patient appears to be doing well with regard to her wound. She has been tolerating dressing changes without complication. Fortunately there does not appear to be any signs of active infection which is great news. No fevers, chills, nausea, vomiting, or diarrhea. 10/25/2020 upon evaluation today patient appears  to be doing well with regard to her wound. She has been tolerating the dressing changes without complication. Fortunately there is no signs of active infection at this time. No fevers, chills, nausea, vomiting, or diarrhea. 11/01/2020 upon evaluation today patient with regard to her wound. She has been tolerating the dressing changes without complication.  Fortunately there does not appear to be any signs of infection which is great news. No fever chills noted 11/15/2020 upon evaluation today patient appears to be doing well with regard to her wound. Fortunately there is no signs of active infection at this time. No fevers, chills, nausea, vomiting, or diarrhea. With that being said she continues to have a significant amount of pain at the site even though this is very close to complete closure. She also had several varicose veins around the area which were also problematic. Overall however I feel like the patient is making excellent progress. 11/28/2020 upon evaluation today patient appears to be doing well with regard to her leg ulcer. Again were not really able to debride or compression wrap her due to discomfort and pain. She does not allow for that. With that being said we have been using Iodosorb which does seem to be doing decently well. Fortunately there is no signs of active infection at this time which is great news. No fevers, chills, nausea, vomiting, or diarrhea. 8/31; patient presents for 2-week follow-up. She has been using Iodosorb. She reports that the wound is closed. She denies signs of infection. Readmission: 10-27-2021 upon evaluation this is a patient that presents today whom I have previously seen this is pretty much for the same issue though I think a little bit higher than the last time I saw her. She does have a history of chronic venous insufficiency and hypertension along with varicose veins. Subsequently she does have an ulceration which spontaneously ruptured she has not been wearing any compression which I think is a big part of the issue here. We discussed this before I really think she probably needs to be wearing her compression therapy, she probably needs lymphedema pumps if she can ever wear the compression for a significant amount of time to get these, and subsequently also think that she needs to be elevating her legs  is much as possible she may even need some vascular intervention in regard to her veins. All of this was reiterated and discussed with her today to reinforce what needs to happen in order to ensure that her legs do not get a lot worse. The patient voiced understanding. She tells me that she knows because she is seeing her mom go through a lot of this as well how bad things can get. 11-03-2021 upon evaluation today patient appears to be doing well with regard to her wound. Fortunately there does not appear to be any signs of active infection at this time. She is measuring a little bit bigger but I think this is because the wound is actually cleaning up a bit here. 7/27; left lateral leg wound not any smaller but perhaps with a cleaner surface. She is using Iodoflex to help with the latter and using Tubigrip. She has chronic venous insufficiency with secondary lymphedema. She is apparently followed by vein and vascular and is being scheduled for an ablation 11-17-2021 upon evaluation today patient appears to be doing well with regard to her wound this is actually showing signs of improvement which is great news. Fortunately I do not see any evidence of active infection locally or  systemically at this time which is great news. No fevers, chills, nausea, vomiting, or diarrhea. 11-24-2021 upon evaluation today patient's wound is actually showing signs of significant improvement. Unfortunately she had quite a bit of pain with debridement last week I do believe it was beneficial but at the same time she is doing much better but still really does not want this debrided again I think being that it is looking a whole lot better I would try to avoid that today especially since it caused her so much discomfort that is really not the goal and I explained that to the patient today she voiced understanding and knows that it needed to be done but still states that it was quite painful. 11-30-2021 upon evaluation today  patient appears to be doing excellent in regard to her wound this is actually showing signs of excellent improvement I am very pleased with where things stand. She does have her venous ablation appointment for October 17. 12-21-2021 upon evaluation today patient appears to be doing better in regard to her wound this is measuring smaller and looking better as well. Fortunately I do not see any signs of active infection locally or systemically which is great news. [name], [name] ([id number]) [id number] Page 4 of 11 01-01-2022 upon evaluation today patient appears to be doing well currently in regard to her wound. She is showing signs of improvement which is great news and overall I do not see any signs of active infection locally or systemically at this time. 01-08-2022 upon evaluation today patient appears to be doing well currently in regard to her wound she is actually showing signs of significant improvement which is great news. Fortunately I do not see any evidence of active infection locally or systemically at this time. I do believe that we are on the right track. She also has her appointment October 19 for the venous ablation. 01-16-2022 upon evaluation today patient's wound actually is showing some signs of improvement although this is very slow. Fortunately I do not see any evidence of infection at this time. The volume is a little bit more although the size is smaller I think this is due to the fact that we are slowly cleaning this area out effectively. 11/6 continued improvement the patient is using Iodoflex and a Tubigrip E. She was supposed to have venous surgery at vein and vascular in [place] however somehow this is gotten delayed till December 14. 02-27-2022 upon evaluation today patient's wound is actually showing signs of being completely healed. Fortunately I do not see any evidence of active infection locally or systemically which is great news and  overall I am extremely pleased with where we stand today. Unfortunately she does tell me that she postpone her venous ablation surgery until December 14 she tells me that she was not ready "financially" for this. 04/18/2022; Ms. Hadlea Accomando is a 37 year old female with a past medical history of venous insufficiency that presents the clinic for a left lower extremity wound. She was seen almost 2 months ago for the same wound. This had healed with Iodoflex and Tubigrip. She states that the wound recently reopened. She has seen vein and vascular and plan is for ligation and stripping of the left great saphenous vein. She is not sure when this procedure is going to be scheduled. She has canceled it once before. She has had office compression wraps in the past however these do not stay on and create more of an issue for her. She would like  to avoid this. She currently denies signs of infection. 1/10; patient presents for follow-up. She has been using Iodoflex to the wound bed. She states she has been using her Tubigrip however she does not have this on today. She has no issues or complaints today. She denies signs of infection. 1/17; patient presents for follow-up. She has not been using Iodoflex to the wound bed. She reports acute pain. She denies increased warmth, erythema or purulent drainage to the left lower extremity. She states she has been using Tubigrip. She has information to order the compression stockings but has not obtained them. 1/24; Patient had a wound culture done at last clinic visit that grew Proteus mirabilis and E. coli. She was prescribed levofloxacin and this should cover both bacteria. She has been taking the medication over the past week with improvement of symptoms to the wound bed. She has been using Hydrofera Blue under Tubigrip. She states the [facility] is sticking to her wound bed. 1/31; patient presents for follow-up. She has been using Medihoney to the wound bed with  improvement in healing. She has been contacted by [facility] to order her antibiotic ointment. She is not sure yet if she is able to afford this. She received her compression stockings but states they did not fit well. She is working on returning these. She has been using Tubigrip. 2/7; patient presents for follow-up. She is been using Medihoney to the wound bed. She obtained her compression stockings and has been using these daily. She states that the [facility] antibiotic ointment is arriving in the mail tomorrow. She has no issues or complaints today. She denies systemic signs of infection. 2/14; patient presents for follow-up. She has been using Keystone antibiotic ointment to the wound bed. She states the [facility] is sticking and she has stopped this. She is been using her compression stockings daily. She does not have these on today. 2/28; patient presents for follow-up. She continues to use [facility] antibiotic ointment to the wound bed. There is been improvement in healing. She has been using her compression stockings daily. 3/12; patient presents for follow-up. She has been using Keystone antibiotic ointment to the wound bed. She is not wearing her compression stockings today but states she does wear them daily. She declines debridement today. 3/20; patient presents for follow-up. She has been using Keystone antibiotic ointment with Hydrofera Blue. She states she is wearing her compression stockings daily but she does not have them on today. 3/27; patient presents for follow-up. She has been using Keystone antibiotic ointment and hydrofera blue to the wound bed. She reports wearing compression stockings. 4/10; patient presents for follow-up. She has been using Keystone antibiotic ointment and Hydrofera Blue to the wound bed. She reports wearing compression stockings. She has no issues or complaints today. 4/24; patient presents for follow-up. She has been using Keystone antibiotic  ointment and Hydrofera Blue to the wound bed. Overall there is healthier tissue today. She reports wearing her compression stockings daily. 5/8; patient comes into clinic today with outer stocking on the left leg and with no Keystone. She has small wounds on the medial left lower leg and has been using Keystone and [employer]	[place] at home. 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 She has chronic venous hypertension and stage III lymphedema. She came in today saying she did not want debridement and really was not interested in compression wraps saying they fall down consistently. She does not have compression  pumps 6/20; patient has missed her last 3 follow-up appointments. She has been using Keystone antibiotic ointment and Hydrofera Blue. We discussed potentially doing an in office wrap and patient declined Today. She states she will try the in office wrap next week. She denies signs of infection. 7/3; patient presents for follow-up. She missed her last clinic appointment. She declines doing the in office wrap today. She has been using Hydrofera Blue with antibiotic ointment. 8/14; patient has not been here since the beginning of July. She has wounds secondary to chronic venous insufficiency and lymphedema on the left medial lower leg. She has supposedly been using [facility]. She is covering this with some form of foam dressing. She is using compression stockings from elastic therapy which are probably 20/30 mmHg although we just do not seem to be able to identify that. She tells me she puts those on during the day and leaves them on all night and only changing them once a day. 9/4; patient presents for follow-up. She has been using Hydrofera Blue to the wound bed. The wound is smaller. She has been wearing her compression stockings daily. Electronic Signature(s) Signed: 12/19/2022 4:18:13 PM By: Geralyn Corwin DO Entered By: Geralyn Corwin on 12/19/2022  16:12:07 [name], [name] ([id number]) [id number] Page 5 of 11 -------------------------------------------------------------------------------- Physical Exam Details Patient Name: Date of Service: [name], [name] [place] 12/19/2022 3:30 PM Medical Record Number: [id number] Patient Account Number: [id number] Date of Birth/Sex: Treating RN: [birth year] (37 y.o. Freddy Finner Primary Care Provider: [name] Other Clinician: [name] Referring Provider: Treating Provider/Extender: Emeline Darling in Treatment: 35 Constitutional . Cardiovascular . Psychiatric . Notes T the left lower extremity on the distal medial aspect there are 2 open wounds with granulation tissue and slough accumulation. No signs of active infection. o Uncontrolled edema. Electronic Signature(s) Signed: 12/19/2022 4:18:13 PM By: Geralyn Corwin DO Entered By: Geralyn Corwin on 12/19/2022 16:12:40 -------------------------------------------------------------------------------- Physician Orders Details Patient Name: Date of Service: [name] Moody MA J. 12/19/2022 3:30 PM Medical Record Number: [id number] Patient Account Number: [id number] Date of Birth/Sex: Treating RN: Aug 20, 1985 (37 y.o. Freddy Finner Primary Care Provider: [name] Other Clinician: [name] Referring Provider: Treating Provider/Extender: Emeline Darling in Treatment: 77 Verbal / Phone Orders: No Diagnosis Coding ICD-10 Coding Code Description [phone] Chronic venous hypertension (idiopathic) with ulcer of left lower extremity L97.822 Non-pressure chronic ulcer of other part of left lower leg with fat layer exposed I89.0 Lymphedema, not elsewhere classified Wound Treatment Electronic Signature(s) HALEY, TRESCOTT ([id number]) [id number] Page 6 of 11 Signed: 12/19/2022 4:18:13 PM By: Geralyn Corwin DO Entered By:  Geralyn Corwin on 12/19/2022 16:13:40 -------------------------------------------------------------------------------- Problem List Details Patient Name: Date of Service: [name] Moody MA J. 12/19/2022 3:30 PM Medical Record Number: [id number] Patient Account Number: [id number] Date of Birth/Sex: Treating RN: [birth year] (37 y.o. 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 12/19/2022 3:30  PM Medical Record Number: [id number] Patient Account Number: [id number] Date of Birth/Sex: Treating RN: 1985-08-21 (37 y.o. Freddy Finner Primary Care Provider: [name] Other Clinician: [name] Referring Provider: Treating Provider/Extender: Emeline Darling in Treatment: [address], [place] J ([id number]) [id number] Page 7 of 11 Subjective Chief Complaint Information obtained from Patient 04/18/2022; Left LE Ulcer History of Present Illness (HPI) 37 year old patient who started with having ulcerations on the right lower leg on the lateral part of her ankle for about 2 weeks. She was seen in the ER at [facility] and advised to see the  wound care for a consultation. No X-rays of workup was done during the ER visit and no prescription for any medications of compression wraps were given. the patient is not diabetic but does have hypertension and her medications have been reviewed by me. 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 The right and left greater saphenous vein was found to be tortuous. Deep venous system was also not competent and there was reflux of more than 500 ms. She was then seen by Dr. Karie Schwalbe Early who recommended that the patient would not benefit from endovenous ablation and he had recommended vein stripping odd on the right side and multiple small phlebectomy procedures on the left side. the patient did not follow-up due to social economic reasons. She has not been wearing any compression stockings and has not taken any specific treatment for varicose veins for the last 3 years. 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 We have obtained some appointments to see the vascular surgeons by the end of the month and the patient would like to follow up with me at my [facility] on Wednesday, June 29. 10/14/2014 -- she could not see me yesterday in [place] and hence has come for a review today. She has a vascular workup to be done this afternoon at [facility]. She is doing fine otherwise. 10/22/2014 -- she was seen by Dr. Brantley Fling and he has recommended surgical removal of her right saphenous vein from distal thigh to saphenofemoral odd junction and stab phlebectomy's of multiple large tributary branches throughout her thigh and calf. This would be done under general anesthesia in the outpatient setting. 10/29/2014 -- she is trying to work on a  surgical date and in the meanwhile we have got insurance clearance for Apligraf and we will start this next week. 7/22 2016 -- she is here for the first application of Apligraf. 11/19/2014 -- she is here for a second application of Apligraf 11/26/2014 -- she has done fine after her last application of Apligraf and is awaiting her surgery which is scheduled for August 31. 12/03/2014 -- she is doing fine and is here for her third application of Apligraf. 12/21/2014 -- She had surgery on 12/15/2014 by Dr.Early who did #1 ligation and stripping of right great saphenous vein from distal thigh to saphenofemoral junction, #2 stab phlebectomy of large tributary varicose veins in the thigh popliteal space and calf. She had an Ace wrap up to her groin and this was removed today and the Unna's boot was also removed. 12/28/2014 -- she is here for her fourth application of Apligraf. 01/06/2015 - he saw her vascular surgeon  Dr. Arbie Cookey who was pleased with her progress and he has confirmed that no surgical procedures could be attempted on the left side. 01/13/2015 -- her wound looks very good and she's been having no problems whatsoever. Readmission: 07/26/2020 upon evaluation today patient presents for initial inspection here in our clinic for a new issue with her left leg although she is previously been seen due to issues with the right leg back in 2016. At that time she was seeing Dr. Arbie Cookey who is a vein/vascular specialist in [place]. He has since semiretired from what I understand. He is working out of [employer] I believe. Nonetheless she tells me at the time that there was really nothing to do for her left leg although the right leg was where they did most of the work. Subsequently she states that she is done fairly well until just in the past week where she had issues with bleeding from what appears to be varicose vein on the left leg medially. Unfortunately this has continued to be an issue although she  tells me at first it was coming much more significantly Down quite a bit but nonetheless has not completely resolved. Every time she showers she notices that it starts to drain a little bit more. She does have a history of chronic venous insufficiency, lymphedema, varicose veins bilaterally, and obesity. 08/02/2020 upon evaluation today patient appears to be doing about the same in regards to the ulcer on her left leg. She has some eschar covering there is definitely some fluid collecting underneath unfortunately. With that being said she tells me she is still having a tremendous amount of pain therefore she is really not able to allow me to clean this off very effectively to be perfectly honest. I think we need to try to soften this up 08/16/2020 upon evaluation today patient's wound is really not doing significantly better not really states about the same. She notes that the wrap just does not seem to be staying up very well at all unfortunately. No fevers, chills, nausea, vomiting, or diarrhea. She did cut it off once it starts to slide in order to alleviate some of the pressure from sliding Down. Fortunately there is no signs of active infection at this time which is great news. 08/23/2020 upon evaluation today patient appears to be doing well 08/23/2020 upon evaluation today patient appears to be doing well with regard to her wound all things considered. Fortunately there does not appear to be any signs of active infection at this time which is great news. She has been tolerating the dressing changes without complication and overall I am extremely pleased with where things stand at this point. She does have her appointment with vascular in [facility] on June 9. 08/30/2020 upon evaluation today patient actually appears to be doing decently well in regard to her wound. Fortunately there is no signs of active infection which is great news. Nonetheless I do believe that the patient is going require little  bit of debridement if she is okay with me attempting that today I think that will help clean off some of the necrotic tissue. Fortunately there does not appear to be otherwise any evidence of active infection which is also great news. 09/19/2020 upon evaluation today patient appears to be doing a little better in regard to her wound as compared to previous. Fortunately there does not appear to be any signs of active infection overall. No fever chills noted. I do believe that the Iodosorb has made this a  little bit better with regard to the overall size and appearance of the wound bed though again she does still have quite a ways to go to get this to heal she still very tender to touch. 09/27/2020 upon evaluation today patient appears to actually be doing quite well with regard to her wound. This is measuring much smaller which is great news. With that being said she did see vein and vascular in [facility] and they subsequently recommended that surgery is really what she probably needs to go forward with sounds like the potential for venous ablation. With that being said the patient tells me this is just not the right time for her to be able to proceed with any type of surgery which I completely understand. Nonetheless I do believe that she would continue to benefit from compression but again that is really not something that she is able to easily do. 10/04/2020 upon evaluation today patient appears to be doing about the same in regard to her wound. This is measuring a little bit smaller but nonetheless still is open and again has some slough and biofilm noted on the surface of the wound. There does not appear to be any signs of active infection which is great news. No fevers, chills, nausea, vomiting, or diarrhea. 10/04/2020 upon evaluation today patient appears to be doing well with regard to her wound. She has been tolerating dressing changes without complication. Fortunately there does not appear to be  any signs of active infection which is great news. No fevers, chills, nausea, vomiting, or diarrhea. 10/25/2020 upon evaluation today patient appears to be doing well with regard to her wound. She has been tolerating the dressing changes without complication. Fortunately there is no signs of active infection at this time. No fevers, chills, nausea, vomiting, or diarrhea. [name], [name] ([id number]) [id number] Page 8 of 11 11/01/2020 upon evaluation today patient with regard to her wound. She has been tolerating the dressing changes without complication. Fortunately there does not appear to be any signs of infection which is great news. No fever chills noted 11/15/2020 upon evaluation today patient appears to be doing well with regard to her wound. Fortunately there is no signs of active infection at this time. No fevers, chills, nausea, vomiting, or diarrhea. With that being said she continues to have a significant amount of pain at the site even though this is very close to complete closure. She also had several varicose veins around the area which were also problematic. Overall however I feel like the patient is making excellent progress. 11/28/2020 upon evaluation today patient appears to be doing well with regard to her leg ulcer. Again were not really able to debride or compression wrap her due to discomfort and pain. She does not allow for that. With that being said we have been using Iodosorb which does seem to be doing decently well. Fortunately there is no signs of active infection at this time which is great news. No fevers, chills, nausea, vomiting, or diarrhea. 8/31; patient presents for 2-week follow-up. She has been using Iodosorb. She reports that the wound is closed. She denies signs of infection. Readmission: 10-27-2021 upon evaluation this is a patient that presents today whom I have previously seen this is pretty much for the same issue though I think a  little bit higher than the last time I saw her. She does have a history of chronic venous insufficiency and hypertension along with varicose veins. Subsequently she does have an ulceration which spontaneously  ruptured she has not been wearing any compression which I think is a big part of the issue here. We discussed this before I really think she probably needs to be wearing her compression therapy, she probably needs lymphedema pumps if she can ever wear the compression for a significant amount of time to get these, and subsequently also think that she needs to be elevating her legs is much as possible she may even need some vascular intervention in regard to her veins. All of this was reiterated and discussed with her today to reinforce what needs to happen in order to ensure that her legs do not get a lot worse. The patient voiced understanding. She tells me that she knows because she is seeing her mom go through a lot of this as well how bad things can get. 11-03-2021 upon evaluation today patient appears to be doing well with regard to her wound. Fortunately there does not appear to be any signs of active infection at this time. She is measuring a little bit bigger but I think this is because the wound is actually cleaning up a bit here. 7/27; left lateral leg wound not any smaller but perhaps with a cleaner surface. She is using Iodoflex to help with the latter and using Tubigrip. She has chronic venous insufficiency with secondary lymphedema. She is apparently followed by vein and vascular and is being scheduled for an ablation 11-17-2021 upon evaluation today patient appears to be doing well with regard to her wound this is actually showing signs of improvement which is great news. Fortunately I do not see any evidence of active infection locally or systemically at this time which is great news. No fevers, chills, nausea, vomiting, or diarrhea. 11-24-2021 upon evaluation today patient's wound is  actually showing signs of significant improvement. Unfortunately she had quite a bit of pain with debridement last week I do believe it was beneficial but at the same time she is doing much better but still really does not want this debrided again I think being that it is looking a whole lot better I would try to avoid that today especially since it caused her so much discomfort that is really not the goal and I explained that to the patient today she voiced understanding and knows that it needed to be done but still states that it was quite painful. 11-30-2021 upon evaluation today patient appears to be doing excellent in regard to her wound this is actually showing signs of excellent improvement I am very pleased with where things stand. She does have her venous ablation appointment for October 17. 12-21-2021 upon evaluation today patient appears to be doing better in regard to her wound this is measuring smaller and looking better as well. Fortunately I do not see any signs of active infection locally or systemically which is great news. 01-01-2022 upon evaluation today patient appears to be doing well currently in regard to her wound. She is showing signs of improvement which is great news and overall I do not see any signs of active infection locally or systemically at this time. 01-08-2022 upon evaluation today patient appears to be doing well currently in regard to her wound she is actually showing signs of significant improvement which is great news. Fortunately I do not see any evidence of active infection locally or systemically at this time. I do believe that we are on the right track. She also has her appointment October 19 for the venous ablation. 01-16-2022 upon evaluation today  patient's wound actually is showing some signs of improvement although this is very slow. Fortunately I do not see any evidence of infection at this time. The volume is a little bit more although the size is smaller I  think this is due to the fact that we are slowly cleaning this area out effectively. 11/6 continued improvement the patient is using Iodoflex and a Tubigrip E. She was supposed to have venous surgery at vein and vascular in [place] however somehow this is gotten delayed till December 14. 02-27-2022 upon evaluation today patient's wound is actually showing signs of being completely healed. Fortunately I do not see any evidence of active infection locally or systemically which is great news and overall I am extremely pleased with where we stand today. Unfortunately she does tell me that she postpone her venous ablation surgery until December 14 she tells me that she was not ready "financially" for this. 04/18/2022; Ms. Ky Harpole is a 37 year old female with a past medical history of venous insufficiency that presents the clinic for a left lower extremity wound. She was seen almost 2 months ago for the same wound. This had healed with Iodoflex and Tubigrip. She states that the wound recently reopened. She has seen vein and vascular and plan is for ligation and stripping of the left great saphenous vein. She is not sure when this procedure is going to be scheduled. She has canceled it once before. She has had office compression wraps in the past however these do not stay on and create more of an issue for her. She would like to avoid this. She currently denies signs of infection. 1/10; patient presents for follow-up. She has been using Iodoflex to the wound bed. She states she has been using her Tubigrip however she does not have this on today. She has no issues or complaints today. She denies signs of infection. 1/17; patient presents for follow-up. She has not been using Iodoflex to the wound bed. She reports acute pain. She denies increased warmth, erythema or purulent drainage to the left lower extremity. She states she has been using Tubigrip. She has information to order the compression  stockings but has not obtained them. 1/24; Patient had a wound culture done at last clinic visit that grew Proteus mirabilis and E. coli. She was prescribed levofloxacin and this should cover both bacteria. She has been taking the medication over the past week with improvement of symptoms to the wound bed. She has been using Hydrofera Blue under Tubigrip. She states the [facility] is sticking to her wound bed. 1/31; patient presents for follow-up. She has been using Medihoney to the wound bed with improvement in healing. She has been contacted by [facility] to order her antibiotic ointment. She is not sure yet if she is able to afford this. She received her compression stockings but states they did not fit well. She is working on returning these. She has been using Tubigrip. 2/7; patient presents for follow-up. She is been using Medihoney to the wound bed. She obtained her compression stockings and has been using these daily. She states that the [facility] antibiotic ointment is arriving in the mail tomorrow. She has no issues or complaints today. She denies systemic signs of infection. 2/14; patient presents for follow-up. She has been using Keystone antibiotic ointment to the wound bed. She states the [facility] is sticking and she has stopped this. She is been using her compression stockings daily. She does not have these on today. [name],  [name] ([id number]) [id number] Page 9 of 11 2/28; patient presents for follow-up. She continues to use [facility] antibiotic ointment to the wound bed. There is been improvement in healing. She has been using her compression stockings daily. 3/12; patient presents for follow-up. She has been using Keystone antibiotic ointment to the wound bed. She is not wearing her compression stockings today but states she does wear them daily. She declines debridement today. 3/20; patient presents for follow-up. She has been using Keystone  antibiotic ointment with Hydrofera Blue. She states she is wearing her compression stockings daily but she does not have them on today. 3/27; patient presents for follow-up. She has been using Keystone antibiotic ointment and hydrofera blue to the wound bed. She reports wearing compression stockings. 4/10; patient presents for follow-up. She has been using Keystone antibiotic ointment and Hydrofera Blue to the wound bed. She reports wearing compression stockings. She has no issues or complaints today. 4/24; patient presents for follow-up. She has been using Keystone antibiotic ointment and Hydrofera Blue to the wound bed. Overall there is healthier tissue today. She reports wearing her compression stockings daily. 5/8; patient comes into clinic today with outer stocking on the left leg and with no Keystone. She has small wounds on the medial left lower leg and has been using Keystone and [employer]	[place] at home. 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 She has chronic venous hypertension and stage III lymphedema. She came in today saying she did not want debridement and really was not interested in compression wraps saying they fall down consistently. She does not have compression pumps 6/20; patient has missed her last 3 follow-up appointments. She has been using Keystone antibiotic ointment and Hydrofera Blue. We discussed potentially doing an in office wrap and patient declined Today. She states she will try the in office wrap next week. She denies signs of infection. 7/3; patient presents for follow-up. She missed her last clinic appointment. She declines doing the in office wrap today. She has been using Hydrofera Blue with antibiotic ointment. 8/14; patient has not been here since the beginning of July. She has wounds secondary to chronic venous insufficiency and lymphedema on the left medial lower leg. She has supposedly been using [facility]. She is covering  this with some form of foam dressing. She is using compression stockings from elastic therapy which are probably 20/30 mmHg although we just do not seem to be able to identify that. She tells me she puts those on during the day and leaves them on all night and only changing them once a day. 9/4; patient presents for follow-up. She has been using Hydrofera Blue to the wound bed. The wound is smaller. She has been wearing her compression stockings daily. Objective Constitutional Vitals Time Taken: 3:49 PM, Height: 66 in, Weight: 400 lbs, BMI: 64.6, Temperature: 98.6 F, Pulse: 107 bpm, Respiratory Rate: 18 breaths/min, Blood Pressure: 110/61 mmHg. General Notes: T the left lower extremity on the distal medial aspect there are 2 open wounds with granulation tissue and slough accumulation. No signs of o active infection. Uncontrolled edema. Integumentary (Hair, Skin) Wound #5 status is Open. Original cause of wound was Gradually Appeared. The date acquired was: 04/10/2022. The wound has been in treatment 35 weeks. The wound is located on the Left,Medial Lower Leg. The wound measures 1.5cm length x 3.5cm width x 0.2cm depth; 4.123cm^2 area and 0.825cm^3 volume. There is Fat Layer (Subcutaneous Tissue) exposed. There is  a medium amount of serosanguineous drainage noted. There is medium (34-66%) red granulation within the wound bed. There is a medium (34-66%) amount of necrotic tissue within the wound bed including Adherent Slough. Assessment Active Problems ICD-10 Chronic venous hypertension (idiopathic) with ulcer of left lower extremity Non-pressure chronic ulcer of other part of left lower leg with fat layer exposed Lymphedema, not elsewhere classified Patient's wounds have improved in size since last clinic visit. I debrided nonviable tissue. I recommended continuing the course with Hydrofera Blue and daily compression stockings. Follow-up in 2 weeks. Procedures Wound #5 Pre-procedure  diagnosis of Wound #5 is a Venous Leg Ulcer located on the Left,Medial Lower Leg .Severity of Tissue Pre Debridement is: Fat layer exposed. [name], [name] ([id number]) [id number] Page 10 of 11 There was a Selective/Open Wound Non-Viable Tissue Debridement with a total area of 4.12 sq cm performed by Geralyn Corwin, MD. With the following instrument(s): Curette to remove Viable and Non-Viable tissue/material. Material removed includes Slough. A time out was conducted at 16:03, prior to the start of the procedure. A Minimum amount of bleeding was controlled with Pressure. The procedure was tolerated well. Post Debridement Measurements: 1.5cm length x 3.5cm width x 0.2cm depth; 0.825cm^3 volume. Character of Wound/Ulcer Post Debridement is stable. Severity of Tissue Post Debridement is: Fat layer exposed. Post procedure Diagnosis Wound #5: Same as Pre-Procedure Plan 1. In office sharp debridement 2. Hydrofera Blue 3. Compression stockings daily 4. Follow-up in 2 weeks Electronic Signature(s) Signed: 12/19/2022 4:18:13 PM By: Geralyn Corwin DO Entered By: Geralyn Corwin on 12/19/2022 16:13:21 -------------------------------------------------------------------------------- ROS/PFSH Details Patient Name: Date of Service: [name] Moody MA J. 12/19/2022 3:30 PM Medical Record Number: [id number] Patient Account Number: [id number] Date of Birth/Sex: Treating RN: 1986/04/04 (37 y.o. Freddy Finner Primary Care Provider: [name] Other Clinician: [name] Referring Provider: Treating Provider/Extender: Emeline Darling in Treatment: 35 Information Obtained From Patient Cardiovascular Medical History: Positive for: Hypertension; Peripheral Venous Disease Endocrine Medical History: Past Medical History Notes: pre DM, A1C 5.5 12/21 Immunizations Pneumococcal Vaccine: Received Pneumococcal Vaccination: No Implantable  Devices None Hospitalization / Surgery History Type of Hospitalization/Surgery umb hernia repair right lower leg vein surgery Family and Social History Never smoker; Marital Status - Single; Alcohol Use: Rarely; Drug Use: Current History - marijuana; Caffeine Use: Rarely; Financial Concerns: No; Food, Clothing or Shelter Needs: No; Support System Lacking: No; Transportation Concerns: No [name], [name] ([id number]) [id number] Page 11 of 11 Electronic Signature(s) Signed: 12/19/2022 4:18:13 PM By: Geralyn Corwin DO Signed: 12/20/2022 1:40:54 PM By: Yevonne Pax RN Entered By: Geralyn Corwin on 12/19/2022 16:13:53 -------------------------------------------------------------------------------- SuperBill Details Patient Name: Date of Service: [name] Moody MA J. 12/19/2022 Medical Record Number: [id number] Patient Account Number: [id number] Date of Birth/Sex: Treating RN: 1985/06/16 (37 y.o. Sharyne Peach, [name] Primary Care Provider: [name] Other Clinician: [name] Referring Provider: Treating Provider/Extender: Emeline Darling in Treatment: 35 Diagnosis Coding ICD-10 Codes Code Description [phone] Chronic venous hypertension (idiopathic) with ulcer of left lower extremity L97.822 Non-pressure chronic ulcer of other part of left lower leg with fat layer exposed I89.0 Lymphedema, not elsewhere classified Facility Procedures : CPT4 Code: 60630160 Description: [phone] - DEBRIDE WOUND 1ST 20 SQ CM OR < ICD-10 Diagnosis Description I87.312 Chronic venous hypertension (idiopathic) with ulcer of left lower extremity L97.822 Non-pressure chronic ulcer of other part of left lower leg with fat layer expose  I89.0 Lymphedema, not elsewhere classified Modifier: d Quantity: 1 Physician Procedures : CPT4 Code Description Modifier  [id number] - WC PHYS DEBR WO ANESTH 20 SQ CM ICD-10 Diagnosis Description I87.312 Chronic venous  hypertension (idiopathic) with ulcer of left lower extremity L97.822 Non-pressure chronic ulcer of other part of left  lower leg with fat layer exposed I89.0 Lymphedema, not elsewhere classified Quantity: 1 Electronic Signature(s) Signed: 12/19/2022 4:18:13 PM By: Geralyn Corwin DO Entered By: Geralyn Corwin on 12/19/2022 16:13:31

## 2022-12-21 ENCOUNTER — Ambulatory Visit (INDEPENDENT_AMBULATORY_CARE_PROVIDER_SITE_OTHER): Payer: BC Managed Care – PPO | Admitting: Pharmacist

## 2022-12-21 ENCOUNTER — Encounter: Payer: Self-pay | Admitting: Pharmacist

## 2022-12-21 VITALS — BP 116/60 | HR 86 | Ht 66.0 in | Wt >= 6400 oz

## 2022-12-21 DIAGNOSIS — F17200 Nicotine dependence, unspecified, uncomplicated: Secondary | ICD-10-CM

## 2022-12-21 NOTE — Patient Instructions (Signed)
It was nice to see you today!  Medication Changes:  Continue all medication the same.

## 2022-12-21 NOTE — Progress Notes (Signed)
   S:     Chief Complaint  Patient presents with   Medication Management    Wheezing and shortness of breath   37 y.o. female who presents for respiratory evaluation, education, and management. Patient arrives in good spirits and presents without any assistance. Patient was referred and last seen by Primary Care Provider, Dr. Melissa Noon, on 12/08/22.   PMH is significant for HTN and prediabetes.  At last visit with Dr. Melissa Noon, patient reports dyspnea requiring use of albuterol inhaler. Normal work of breathing in clinic.  Patient reports wheezing and shortness of breath, particularly when smoking weed (starting smoking weed at age 21). She states she has been using albuterol inhaler ~ 3-4 times a day. Patient is interested in quitting smoking (reports smoking marijuana daily since age 48 - denies smoking tobacco). Patient denies atopic sx consistent with (atopic dermatitis, allergic rhinitis).  Patient reports adherence to medications Patient reports last dose of asthma medications was "late last night". Current asthma medications: Ventolin HFA (albuterol) 108 mcg/act Rescue inhaler use frequency: 3-4 times a day  Insurance: BCBS  O: Review of Systems  All other systems reviewed and are negative.   Physical Exam Constitutional:      Appearance: Normal appearance.  Pulmonary:     Effort: Pulmonary effort is normal.  Neurological:     Mental Status: She is alert.  Psychiatric:        Mood and Affect: Mood normal.        Behavior: Behavior normal.        Thought Content: Thought content normal.        Judgment: Judgment normal.     Vitals:   12/21/22 0950  BP: 116/60  Pulse: 86  SpO2: 99%    See "scanned report" or Documentation Flowsheet (discrete results - PFTs) for  Spirometry results. Patient provided good effort while attempting spirometry.  Spirometry Lung Age = 64  Albuterol Nebulizer Solution Lot [id number], Exp Apr 2025  A/P: Patient with remote history of  asthma as a child, who has been experiencing dyspnea requiring albuterol 3-4 times a day. Medication adherence appears good. Patient is interested in quitting smoking by September; states it will be difficult due to stress. Advised patient to take up stress-relieving activities to aid in smoking cessation and focus on weight loss to improve lung function. - Spirometry evaluation with pre- and post-bronchodilator reveals decreased lung function.      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 Reviewed results of pulmonary function tests.  - Discussed need for cessation of any inhalation/particulate matter including marijuana.  Patient states interest/need to quit smoking however she was reluctant to set a firm quit date. Encouraged tapering intake and then focusing on a complete cessation plan.  - No changes in medication today - continue PRN albuterol at this time.  - Patient agreed to follow-up with PCP. Visit scheduled.  Patient verbalized understanding of treatment plan.  Written patient instructions provided.  Total time in face to face counseling 49 minutes.    Follow-up:  Pharmacist PRN PCP clinic visit with Dr. Velna Ochs on 01/28/23 Patient seen with Andee Poles, PharmD Candidate.

## 2022-12-21 NOTE — Assessment & Plan Note (Signed)
Patient with remote history of asthma as a child, who has been experiencing dyspnea requiring albuterol 3-4 times a day. Medication adherence appears good. Patient is interested in quitting smoking by September; states it will be difficult due to stress. Advised patient to take up stress-relieving activities to aid in smoking cessation and focus on weight loss to improve lung function. - Spirometry evaluation with pre- and post-bronchodilator reveals decreased lung function.      - Spirometry evaluation reveals possible restrictive lung disease without reversibility and lung age of 29 years. Reviewed results of pulmonary function tests.  - Discussed need for cessation of any inhalation/particulate matter including marijuana.  Patient states interest/need to quit smoking however she was reluctant to set a firm quit date. Encouraged tapering intake and then focusing on a complete cessation plan.  - No changes in medication today - continue PRN albuterol at this time.  - Patient agreed to follow-up with PCP. Visit scheduled.

## 2022-12-21 NOTE — Progress Notes (Signed)
Reviewed and agree with Dr Koval's plan.   

## 2023-01-02 ENCOUNTER — Ambulatory Visit: Payer: BC Managed Care – PPO | Admitting: Internal Medicine

## 2023-01-09 ENCOUNTER — Ambulatory Visit: Payer: BC Managed Care – PPO | Admitting: Internal Medicine

## 2023-01-11 ENCOUNTER — Other Ambulatory Visit: Payer: Self-pay | Admitting: *Deleted

## 2023-01-11 DIAGNOSIS — I83892 Varicose veins of left lower extremities with other complications: Secondary | ICD-10-CM

## 2023-01-14 ENCOUNTER — Encounter: Payer: BC Managed Care – PPO | Admitting: Physician Assistant

## 2023-01-14 DIAGNOSIS — L97822 Non-pressure chronic ulcer of other part of left lower leg with fat layer exposed: Secondary | ICD-10-CM | POA: Diagnosis not present

## 2023-01-14 NOTE — Progress Notes (Signed)
[name], [name] ([id number]) [id number] Page 1 of 3 Visit Report for 01/14/2023 Chief Complaint Document Details Patient Name: Date of Service: [name], [name] [place]. 01/14/2023 10:00 A M Medical Record Number: [id number] Patient Account Number: [id number] Date of Birth/Sex: Treating RN: 10/26/1985 (37 y.o. Freddy Finner Primary Care Provider: [name] Other Clinician: [name] Referring Provider: Treating Provider/Extender: Jaclynn Major in Treatment: 38 Information Obtained from: Patient Chief Complaint 04/18/2022; Left LE Ulcer Electronic Signature(s) Signed: 01/14/2023 10:42:04 AM By: Allen Derry PA-C Entered By: Allen Derry on 01/14/2023 07:42:04 -------------------------------------------------------------------------------- Physician Orders Details Patient Name: Date of Service: [name]. 01/14/2023 10:00 A M Medical Record Number: [id number] Patient Account Number: [id number] Date of Birth/Sex: Treating RN: [birth year] (37 y.o. Freddy Finner Primary Care Provider: [name] Other Clinician: [name] Referring Provider: Treating Provider/Extender: Jaclynn Major in Treatment: 15 Verbal / Phone Orders: Yes Clinician: Yevonne Pax Read Back and Verified: Yes Diagnosis Coding ICD-10 Coding Code Description I87.312 Chronic venous hypertension (idiopathic) with ulcer of left lower extremity L97.822 Non-pressure chronic ulcer of other part of left lower leg with fat layer exposed I89.0 Lymphedema, not elsewhere classified Follow-up Appointments Wound #5 Left,Medial Lower Leg Return Appointment in 1 week. Bathing/ [employer] wounds with antibacterial soap and water. [name], [name] ([id number]) [id number] Page 2 of 3 May shower with wound dressing protected with water repellent cover or cast protector. No tub bath. Anesthetic (Use  'Patient Medications' Section for Anesthetic Order Entry) Lidocaine applied to wound bed Edema Control - Lymphedema / Segmental Compressive Device / Other Wound #5 Left,Medial Lower Leg Patient to wear own compression stockings. Remove compression stockings every night before going to bed and put on every morning when getting up. - LLE, per Leanord Hawking recommended patient re order 30/40 compression stockings, patient given up to date measurements for left lower extremity. Elevate, Exercise Daily and A void Standing for Long Periods of Time. Elevate legs to the level of the heart and pump ankles as often as possible Elevate leg(s) parallel to the floor when sitting. Wound Treatment Wound #5 - Lower Leg Wound Laterality: Left, Medial Cleanser: [employer] Ancillary Kit - 15 Day Supply (Generic) 1 x Per Day/30 Days Discharge Instructions: Use supplies as instructed; Kit contains: (15) Saline Bullets; (15) 3x3 Gauze; 15 pr Gloves Cleanser: Soap and Water 1 x Per Day/30 Days Discharge Instructions: Gently cleanse wound with antibacterial soap, rinse and pat dry prior to dressing wounds Cleanser: Vashe 5.8 (oz) 1 x Per Day/30 Days Discharge Instructions: Use vashe 5.8 (oz) as directed Peri-Wound Care: AandD Ointment 1 x Per Day/30 Days Discharge Instructions: Apply AandD Ointment as directed around edges to help dressing from sticking Prim Dressing: Hydrofera Blue Ready Transfer Foam, 2.5x2.5 (in/in) (Generic) 1 x Per Day/30 Days ary Discharge Instructions: Apply Hydrofera Blue Ready to wound bed as directed cut to fit wound bed Secondary Dressing: (BORDER) Zetuvit Plus SILICONE BORDER Dressing 4x4 (in/in) (Generic) 1 x Per Day/30 Days Discharge Instructions: Please do not put silicone bordered dressings under wraps. Use non-bordered dressing only. Electronic Signature(s) Unsigned Entered By: [name] on 01/14/2023  07:46:33 -------------------------------------------------------------------------------- Problem List Details Patient Name: Date of Service: [name], [name] [name]. 01/14/2023 10:00 A M Medical Record Number: [id number] Patient Account Number: [id number] Date of Birth/Sex: Treating RN: 1986/04/06 (37 y.o. Freddy Finner Primary Care Provider: [name] Other Clinician: [name] Referring Provider: Treating Provider/Extender: Jaclynn Major in Treatment: 24 Active Problems  ICD-10 Encounter Code Description Active Date MDM Diagnosis I87.312 Chronic venous hypertension (idiopathic) with ulcer of left lower extremity 04/18/2022 No Yes L97.822 Non-pressure chronic ulcer of other part of left lower leg with fat layer exposed1/06/2022 No Yes [name], [name] ([id number]) [id number] Page 3 of 3 I89.0 Lymphedema, not elsewhere classified 04/18/2022 No Yes Inactive Problems Resolved Problems Electronic Signature(s) Signed: 01/14/2023 10:41:55 AM By: Allen Derry PA-C Entered By: Allen Derry on 01/14/2023 07:41:55 -------------------------------------------------------------------------------- SuperBill Details Patient Name: Date of Service: [name]. 01/14/2023 Medical Record Number: [id number] Patient Account Number: [id number] Date of Birth/Sex: Treating RN: 1986/04/13 (37 y.o. Freddy Finner Primary Care Provider: [name] Other Clinician: [name] Referring Provider: Treating Provider/Extender: Jaclynn Major in Treatment: 38 Diagnosis Coding ICD-10 Codes Code Description [phone] Chronic venous hypertension (idiopathic) with ulcer of left lower extremity L97.822 Non-pressure chronic ulcer of other part of left lower leg with fat layer exposed I89.0 Lymphedema, not elsewhere classified Facility Procedures : CPT4 Code: [id number] Description: 99213 - WOUND CARE VISIT-LEV 3 EST  PT Modifier: Quantity: 1 Electronic Signature(s) Unsigned Entered ByBetha Loa on 01/14/2023 07:46:16 Signature(s): Date(s):

## 2023-01-15 NOTE — Progress Notes (Signed)
[name], [name] ([id number]) [id number] Page 1 of 9 Visit Report for 01/14/2023 Arrival Information Details Patient Name: Date of Service: [name], [name] [name]. 01/14/2023 10:00 A M Medical Record Number: [id number] Patient Account Number: [id number] Date of Birth/Sex: Treating RN: 1985/05/08 (37 y.o. Freddy Finner Primary Care Jasie Meleski: [name] Other Clinician: [name] Referring Shatonia Hoots: Treating Elexius Minar/Extender: Jaclynn Major in Treatment: 38 Visit Information History Since Last Visit All ordered tests and consults were completed: No Patient Arrived: Ambulatory Added or deleted any medications: No Arrival Time: 10:25 Any new allergies or adverse reactions: No Transfer Assistance: None Had a fall or experienced change in No Patient Identification Verified: Yes activities of daily living that may affect Secondary Verification Process Completed: Yes risk of falls: Patient Requires Transmission-Based Precautions: No Signs or symptoms of abuse/neglect since last visito No Patient Has Alerts: No Hospitalized since last visit: No Implantable device outside of the clinic excluding No cellular tissue based products placed in the center since last visit: Has Dressing in Place as Prescribed: Yes Has Compression in Place as Prescribed: Yes Pain Present Now: No Electronic Signature(s) Signed: 01/15/2023 8:28:10 AM By: [name] Entered By: [name] on 01/14/2023 07:26:25 -------------------------------------------------------------------------------- Clinic Level of Care Assessment Details Patient Name: Date of Service: [name], [name] [name]. 01/14/2023 10:00 A M Medical Record Number: [id number] Patient Account Number: [id number] Date of Birth/Sex: Treating RN: 01/11/1986 (37 y.o. Freddy Finner Primary Care Alam Guterrez: [name] Other Clinician: [name] Referring Teaghan Melrose: Treating Dionisia Pacholski/Extender:  Jaclynn Major in Treatment: 38 Clinic Level of Care Assessment Items TOOL 4 Quantity Score []  - 0 Use when only an EandM is performed on FOLLOW-UP visit ASSESSMENTS - Nursing Assessment / Reassessment X- 1 10 Reassessment of Co-morbidities (includes updates in patient status) DICIE, [name] [id number] Page 2 of 9 X- 1 5 Reassessment of Adherence to Treatment Plan ASSESSMENTS - Wound and Skin A ssessment / Reassessment X - Simple Wound Assessment / Reassessment - one wound 1 5 []  - 0 Complex Wound Assessment / Reassessment - multiple wounds []  - 0 Dermatologic / Skin Assessment (not related to wound area) ASSESSMENTS - Focused Assessment []  - 0 Circumferential Edema Measurements - multi extremities []  - 0 Nutritional Assessment / Counseling / Intervention []  - 0 Lower Extremity Assessment (monofilament, tuning fork, pulses) []  - 0 Peripheral Arterial Disease Assessment (using hand held doppler) ASSESSMENTS - Ostomy and/or Continence Assessment and Care []  - 0 Incontinence Assessment and Management []  - 0 Ostomy Care Assessment and Management (repouching, etc.) PROCESS - Coordination of Care X - Simple Patient / Family Education for ongoing care 1 15 []  - 0 Complex (extensive) Patient / Family Education for ongoing care []  - 0 Staff obtains Chiropractor, Records, T Results / Process Orders est []  - 0 Staff telephones HHA, Nursing Homes / Clarify orders / etc []  - 0 Routine Transfer to another Facility (non-emergent condition) []  - 0 Routine Hospital Admission (non-emergent condition) []  - 0 New Admissions / Manufacturing engineer / Ordering NPWT Apligraf, etc. , []  - 0 Emergency Hospital Admission (emergent condition) X- 1 10 Simple Discharge Coordination []  - 0 Complex (extensive) Discharge Coordination PROCESS - Special Needs []  - 0 Pediatric / Minor Patient Management []  - 0 Isolation Patient  Management []  - 0 Hearing / Language / Visual special needs []  - 0 Assessment of Community assistance (transportation, D/C planning, etc.) []  - 0 Additional assistance / Altered mentation []  - 0 Support Surface(s)  Assessment (bed, cushion, seat, etc.) INTERVENTIONS - Wound Cleansing / Measurement X - Simple Wound Cleansing - one wound 1 5 []  - 0 Complex Wound Cleansing - multiple wounds X- 1 5 Wound Imaging (photographs - any number of wounds) []  - 0 Wound Tracing (instead of photographs) X- 1 5 Simple Wound Measurement - one wound []  - 0 Complex Wound Measurement - multiple wounds INTERVENTIONS - Wound Dressings []  - 0 Small Wound Dressing one or multiple wounds X- 1 15 Medium Wound Dressing one or multiple wounds []  - 0 Large Wound Dressing one or multiple wounds X- 1 5 Application of Medications - topical []  - 0 Application of Medications - injection INTERVENTIONS - Miscellaneous [name], [name] ([id number]) [id number] Page 3 of 9 []  - 0 External ear exam []  - 0 Specimen Collection (cultures, biopsies, blood, body fluids, etc.) []  - 0 Specimen(s) / Culture(s) sent or taken to Lab for analysis []  - 0 Patient Transfer (multiple staff / Michiel Sites Lift / Similar devices) []  - 0 Simple Staple / Suture removal (25 or less) []  - 0 Complex Staple / Suture removal (26 or more) []  - 0 Hypo / Hyperglycemic Management (close monitor of Blood Glucose) []  - 0 Ankle / Brachial Index (ABI) - do not check if billed separately X- 1 5 Vital Signs Has the patient been seen at the hospital within the last three years: Yes Total Score: 85 Level Of Care: New/Established - Level 3 Electronic Signature(s) Signed: 01/15/2023 8:28:10 AM By: [name] Entered By: [name] on 01/14/2023 07:45:38 -------------------------------------------------------------------------------- Encounter Discharge Information Details Patient Name: Date of  Service: [name] [name]. 01/14/2023 10:00 A M Medical Record Number: [id number] Patient Account Number: [id number] Date of Birth/Sex: Treating RN: 01-25-1986 (37 y.o. Freddy Finner Primary Care Erskine Steinfeldt: [name] Other Clinician: [name] Referring Paislei Dorval: Treating Wade Asebedo/Extender: Jaclynn Major in Treatment: 38 Encounter Discharge Information Items Discharge Condition: Stable Ambulatory Status: Ambulatory Discharge Destination: Home Transportation: Private Auto Accompanied By: self Schedule Follow-up Appointment: Yes Clinical Summary of Care: Electronic Signature(s) Signed: 01/15/2023 8:28:10 AM By: [name] Entered By: [name] on 01/14/2023 07:54:07 Goudeau, [name] [id number] Page 4 of 9 -------------------------------------------------------------------------------- Lower Extremity Assessment Details Patient Name: Date of Service: [name], [name] [place]. 01/14/2023 10:00 A M Medical Record Number: [id number] Patient Account Number: [id number] Date of Birth/Sex: Treating RN: [birth year] (37 y.o. Freddy Finner Primary Care Wrenn Willcox: [name] Other Clinician: [name] Referring Kyri Dai: Treating Vivian Neuwirth/Extender: Jaclynn Major in Treatment: 38 Edema Assessment Assessed: [name] Yes] [name]: No] Edema: [Left: Ye] [Right: s] Calf Left: Right: Point of Measurement: 32 cm From Medial Instep 56.6 cm Ankle Left: Right: Point of Measurement: 12 cm From Medial Instep 31 cm Vascular Assessment Pulses: Dorsalis Pedis Palpable: [Left:Yes] Electronic Signature(s) Signed: 01/14/2023 3:42:39 PM By: Yevonne Pax RN Signed: 01/15/2023 8:28:10 AM By: [name] Entered By: [name] on 01/14/2023 07:34:50 -------------------------------------------------------------------------------- Multi Wound Chart Details Patient Name: Date of Service: [name] [name]. 01/14/2023 10:00 A M Medical Record Number: [id number] Patient Account Number: [id number] Date of Birth/Sex: Treating RN: March 08, 1986 (37 y.o. Freddy Finner Primary Care Kateryna Grantham: [name] Other Clinician: [name] Referring Jaylan Hinojosa: Treating Maycie Luera/Extender: Jaclynn Major in Treatment: 38 Vital Signs Height(in): 66 Pulse(bpm): 90 Weight(lbs): 400 Blood Pressure(mmHg): 122/85 Body Mass Index(BMI): 64.6 Temperature(F): 98.0 Respiratory Rate(breaths/min): 18 [5:Photos:] [N/A:N/A] Left, Medial Lower Leg N/A N/A Wound Location: Gradually Appeared N/A N/A Wounding Event: Venous  Leg Ulcer N/A N/A Primary Etiology: Hypertension, Peripheral Venous N/A N/A Comorbid History: Disease 04/10/2022 N/A N/A Date Acquired: 69 N/A N/A Weeks of Treatment: Open N/A N/A Wound Status: No N/A N/A Wound Recurrence: 0.7x3.2x0.3 N/A N/A Measurements L x W x D (cm) 1.759 N/A N/A A (cm) : rea 0.528 N/A N/A Volume (cm) : 68.00% N/A N/A % Reduction in Area: 52.00% N/A N/A % Reduction in Volume: Full Thickness Without Exposed N/A N/A Classification: Support Structures Medium N/A N/A Exudate Amount: Serosanguineous N/A N/A Exudate Type: red, brown N/A N/A Exudate Color: Medium (34-66%) N/A N/A Granulation Amount: Red N/A N/A Granulation Quality: Medium (34-66%) N/A N/A Necrotic Amount: Fat Layer (Subcutaneous Tissue): Yes N/A N/A Exposed Structures: Fascia: No Tendon: No Muscle: No Joint: No Bone: No Small (1-33%) N/A N/A Epithelialization: Treatment Notes Electronic Signature(s) Signed: 01/15/2023 8:28:10 AM By: [name] Entered By: [name] on 01/14/2023 07:35:05 -------------------------------------------------------------------------------- Multi-Disciplinary Care Plan Details Patient Name: Date of Service: [name] [name]. 01/14/2023 10:00 A M Medical Record Number: [id number] Patient Account Number:  [id number] Date of Birth/Sex: Treating RN: [birth year] (37 y.o. Freddy Finner Primary Care Erickson Yamashiro: [name] Other Clinician: [name] Referring Tijana Walder: Treating Jaritza Duignan/Extender: Jaclynn Major in Treatment: 38 Active Inactive Wound/Skin Impairment Nursing Diagnoses: Knowledge deficit related to ulceration/compromised skin integrity Goals: Patient/caregiver will verbalize understanding of skin care regimen Date Initiated: 04/18/2022 Target Resolution Date: 07/18/2022 Goal Status: Active Ulcer/skin breakdown will have a volume reduction of 30% by week 4 Date Initiated: 04/18/2022 Date Inactivated: 07/04/2022 Target Resolution Date: 05/19/2022 Goal Status: Unmet Unmet Reason: comorbidities Ulcer/skin breakdown will have a volume reduction of 50% by week 8 JANAKI, EXLEY ([id number]) [id number] Page 6 of 9 Date Initiated: 04/18/2022 Date Inactivated: 07/04/2022 Target Resolution Date: 06/18/2022 Goal Status: Unmet Unmet Reason: comorbidities Ulcer/skin breakdown will have a volume reduction of 80% by week 12 Date Initiated: 04/18/2022 Target Resolution Date: 07/18/2022 Goal Status: Active Ulcer/skin breakdown will heal within 14 weeks Date Initiated: 04/18/2022 Target Resolution Date: 08/17/2022 Goal Status: Active Interventions: Assess patient/caregiver ability to obtain necessary supplies Assess patient/caregiver ability to perform ulcer/skin care regimen upon admission and as needed Assess ulceration(s) every visit Notes: Electronic Signature(s) Signed: 01/14/2023 3:42:39 PM By: Yevonne Pax RN Signed: 01/15/2023 8:28:10 AM By: [name] Entered By: [name] on 01/14/2023 07:46:38 -------------------------------------------------------------------------------- Pain Assessment Details Patient Name: Date of Service: [name] [name]. 01/14/2023 10:00 A M Medical Record Number: [id number] Patient Account Number:  [id number] Date of Birth/Sex: Treating RN: Aug 24, 1985 (37 y.o. Freddy Finner Primary Care Olinda Nola: [name] Other Clinician: [name] Referring Kemesha Mosey: Treating Arantxa Piercey/Extender: Jaclynn Major in Treatment: 38 Active Problems Location of Pain Severity and Description of Pain Patient Has Paino No Site Locations Pain Management and Medication Current Pain Management: Electronic Signature(s) Signed: 01/14/2023 3:42:39 PM By: Yevonne Pax RN Signed: 01/15/2023 8:28:10 AM By: Luiz Ochoa, [name] ([id number]) [id number] Page 7 of 9 Entered By: [name] on 01/14/2023 07:30:12 -------------------------------------------------------------------------------- Patient/Caregiver Education Details Patient Name: Date of Service: NATAKI, MCCRUMB [place] 9/30/2024andnbsp10:00 A M Medical Record Number: [id number] Patient Account Number: [id number] Date of Birth/Gender: Treating RN: October 09, 1985 (37 y.o. Freddy Finner Primary Care Physician: [name] Other Clinician: [name] Referring Physician: Treating Physician/Extender: Jaclynn Major in Treatment: 82 Education Assessment Education Provided To: Patient Education Topics Provided Wound/Skin Impairment: Handouts: Other: continue wound care as directed Methods: Explain/Verbal Responses: State content correctly Electronic Signature(s) Signed: 01/15/2023 8:28:10  AM By: [name] Entered By: [name] on 01/14/2023 07:46:58 -------------------------------------------------------------------------------- Wound Assessment Details Patient Name: Date of Service: [name], [name]. 01/14/2023 10:00 A M Medical Record Number: [id number] Patient Account Number: [id number] Date of Birth/Sex: Treating RN: May 14, 1985 (37 y.o. Freddy Finner Primary Care Jamelle Noy: [name] Other Clinician: [name] Referring  Teresita Fanton: Treating Conall Vangorder/Extender: Jaclynn Major in Treatment: 38 Wound Status Wound Number: 5 Primary Etiology: Venous Leg Ulcer Wound Location: Left, Medial Lower Leg Wound Status: Open Wounding Event: Gradually Appeared Comorbid History: Hypertension, Peripheral Venous Disease Date Acquired: 04/10/2022 Weeks Of Treatment: 38 Clustered Wound: No Photos ARIANNI, [name] ([id number]) [id number] Page 8 of 9 Wound Measurements Length: (cm) 0.7 Width: (cm) 3.2 Depth: (cm) 0.3 Area: (cm) 1.759 Volume: (cm) 0.528 % Reduction in Area: 68% % Reduction in Volume: 52% Epithelialization: Small (1-33%) Wound Description Classification: Full Thickness Without Exposed Support Structures Exudate Amount: Medium Exudate Type: Serosanguineous Exudate Color: red, brown Foul Odor After Cleansing: No Slough/Fibrino Yes Wound Bed Granulation Amount: Medium (34-66%) Exposed Structure Granulation Quality: Red Fascia Exposed: No Necrotic Amount: Medium (34-66%) Fat Layer (Subcutaneous Tissue) Exposed: Yes Necrotic Quality: Adherent Slough Tendon Exposed: No Muscle Exposed: No Joint Exposed: No Bone Exposed: No Treatment Notes Wound #5 (Lower Leg) Wound Laterality: Left, Medial Cleanser Byram Ancillary Kit - 15 Day Supply Discharge Instruction: Use supplies as instructed; Kit contains: (15) Saline Bullets; (15) 3x3 Gauze; 15 pr Gloves Soap and Water Discharge Instruction: Gently cleanse wound with antibacterial soap, rinse and pat dry prior to dressing wounds Vashe 5.8 (oz) Discharge Instruction: Use vashe 5.8 (oz) as directed Peri-Wound Care AandD Ointment Discharge Instruction: Apply AandD Ointment as directed around edges to help dressing from sticking Topical Primary Dressing Hydrofera Blue Ready Transfer Foam, 2.5x2.5 (in/in) Discharge Instruction: Apply Hydrofera Blue Ready to wound bed as directed cut to fit wound  bed Secondary Dressing (BORDER) Zetuvit Plus SILICONE BORDER Dressing 4x4 (in/in) Discharge Instruction: Please do not put silicone bordered dressings under wraps. Use non-bordered dressing only. Secured With Compression Wrap Compression Stockings Facilities manager) Signed: 01/14/2023 3:42:39 PM By: Yevonne Pax RN Signed: 01/15/2023 8:28:10 AM By: [name] Entered By: [name] on 01/14/2023 07:33:50 Khiev, [name] ([id number]) [id number] Page 9 of 9 -------------------------------------------------------------------------------- Vitals Details Patient Name: Date of Service: [name], [name] [name]. 01/14/2023 10:00 A M Medical Record Number: [id number] Patient Account Number: [id number] Date of Birth/Sex: Treating RN: 07/29/1985 (37 y.o. Freddy Finner Primary Care Orlando Devereux: [name] Other Clinician: [name] Referring Jhoselin Crume: Treating Katniss Weedman/Extender: Jaclynn Major in Treatment: 38 Vital Signs Time Taken: 10:26 Temperature (F): 98.0 Height (in): 66 Pulse (bpm): 90 Weight (lbs): 400 Respiratory Rate (breaths/min): 18 Body Mass Index (BMI): 64.6 Blood Pressure (mmHg): 122/85 Reference Range: 80 - 120 mg / dl Electronic Signature(s) Signed: 01/15/2023 8:28:10 AM By: [name] Entered By: [name] on 01/14/2023 07:30:05

## 2023-01-21 ENCOUNTER — Encounter: Payer: BC Managed Care – PPO | Attending: Physician Assistant | Admitting: Physician Assistant

## 2023-01-21 ENCOUNTER — Ambulatory Visit: Payer: BC Managed Care – PPO | Admitting: Surgery

## 2023-01-21 ENCOUNTER — Ambulatory Visit (HOSPITAL_COMMUNITY): Payer: BC Managed Care – PPO

## 2023-01-21 DIAGNOSIS — L97822 Non-pressure chronic ulcer of other part of left lower leg with fat layer exposed: Secondary | ICD-10-CM | POA: Insufficient documentation

## 2023-01-21 DIAGNOSIS — I10 Essential (primary) hypertension: Secondary | ICD-10-CM | POA: Insufficient documentation

## 2023-01-21 DIAGNOSIS — E669 Obesity, unspecified: Secondary | ICD-10-CM | POA: Diagnosis not present

## 2023-01-21 DIAGNOSIS — I87312 Chronic venous hypertension (idiopathic) with ulcer of left lower extremity: Secondary | ICD-10-CM | POA: Insufficient documentation

## 2023-01-21 DIAGNOSIS — I890 Lymphedema, not elsewhere classified: Secondary | ICD-10-CM | POA: Insufficient documentation

## 2023-01-21 NOTE — Progress Notes (Addendum)
[name], [name] ([id number]) [id number] Page 1 of 11 Visit Report for 01/21/2023 Chief Complaint Document Details Patient Name: Date of Service: [name], [name] [place] 01/21/2023 2:45 PM Medical Record Number: [id number] Patient Account Number: [id number] Date of Birth/Sex: Treating RN: [birth year] (37 y.o. Freddy Finner Primary Care Provider: [name] Other Clinician: [name] Referring Provider: Treating Provider/Extender: Jaclynn Major in Treatment: 39 Information Obtained from: Patient Chief Complaint 04/18/2022; Left LE Ulcer Electronic Signature(s) Signed: 01/21/2023 3:27:43 PM By: Allen Derry PA-C Entered By: Allen Derry on 01/21/2023 12:27:43 -------------------------------------------------------------------------------- HPI Details Patient Name: Date of Service: [name] Moody MA J. 01/21/2023 2:45 PM Medical Record Number: [id number] Patient Account Number: [id number] Date of Birth/Sex: Treating RN: [birth year] (37 y.o. Freddy Finner Primary Care Provider: [name] Other Clinician: [name] Referring Provider: Treating Provider/Extender: Jaclynn Major in Treatment: 39 History of Present Illness HPI Description: 37 year old patient who started with having ulcerations on the right lower leg on the lateral part of her ankle for about 2 weeks. She was seen in the ER at [facility] and advised to see the wound care for a consultation. No X-rays of workup was done during the ER visit and no prescription for any medications of compression wraps were given. the patient is not diabetic but does have hypertension and her medications have been reviewed by me. 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 The right and left greater  saphenous vein was found to be tortuous. Deep venous system was also not competent and there was reflux of more than 500 ms. She was then seen by Dr. Karie Schwalbe Early who recommended that the patient would not benefit from endovenous ablation and he had recommended vein stripping odd on the right side and multiple small phlebectomy procedures on the left side. the patient did not follow-up due to social economic reasons. She has not been wearing any compression stockings and has not taken any specific treatment for varicose veins for the last 3 years. 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 We have obtained some appointments to see the vascular surgeons by the end of the month and the patient would like to follow up with me at my [facility] on Wednesday, June 29. 10/14/2014 -- she could not see me yesterday in [place] and hence has come for a review today. She has a vascular workup to be done this afternoon at [facility] ([id number]) [phone].pdf Page [address] of [address] She is doing fine otherwise. 10/22/2014 -- she was seen by Dr. Brantley Fling and he has recommended surgical removal of her right saphenous vein from distal thigh to saphenofemoral odd junction and stab phlebectomy's of multiple large tributary branches throughout her thigh and calf. This would be done under general anesthesia in the outpatient setting. 10/29/2014 -- she is trying to work on a surgical date and in the meanwhile we have got insurance clearance for Apligraf and we will start this next week. 7/22 2016 -- she is here for the first application of Apligraf. 11/19/2014 -- she is here for a second application of Apligraf 11/26/2014 -- she has done fine after her last application of Apligraf and is awaiting her surgery which is  scheduled for August 31. 12/03/2014 -- she is doing fine and is  here for her third application of Apligraf. 12/21/2014 -- She had surgery on 12/15/2014 by Dr.Early who did #1 ligation and stripping of right great saphenous vein from distal thigh to saphenofemoral junction, #2 stab phlebectomy of large tributary varicose veins in the thigh popliteal space and calf. She had an Ace wrap up to her groin and this was removed today and the Unna's boot was also removed. 12/28/2014 -- she is here for her fourth application of Apligraf. 01/06/2015 - he saw her vascular surgeon Dr. Arbie Cookey who was pleased with her progress and he has confirmed that no surgical procedures could be attempted on the left side. 01/13/2015 -- her wound looks very good and she's been having no problems whatsoever. Readmission: 07/26/2020 upon evaluation today patient presents for initial inspection here in our clinic for a new issue with her left leg although she is previously been seen due to issues with the right leg back in 2016. At that time she was seeing Dr. Arbie Cookey who is a vein/vascular specialist in [place]. He has since semiretired from what I understand. He is working out of [employer] I believe. Nonetheless she tells me at the time that there was really nothing to do for her left leg although the right leg was where they did most of the work. Subsequently she states that she is done fairly well until just in the past week where she had issues with bleeding from what appears to be varicose vein on the left leg medially. Unfortunately this has continued to be an issue although she tells me at first it was coming much more significantly Down quite a bit but nonetheless has not completely resolved. Every time she showers she notices that it starts to drain a little bit more. She does have a history of chronic venous insufficiency, lymphedema, varicose veins bilaterally, and obesity. 08/02/2020 upon evaluation today patient appears to be doing about the same in regards to the ulcer on her  left leg. She has some eschar covering there is definitely some fluid collecting underneath unfortunately. With that being said she tells me she is still having a tremendous amount of pain therefore she is really not able to allow me to clean this off very effectively to be perfectly honest. I think we need to try to soften this up 08/16/2020 upon evaluation today patient's wound is really not doing significantly better not really states about the same. She notes that the wrap just does not seem to be staying up very well at all unfortunately. No fevers, chills, nausea, vomiting, or diarrhea. She did cut it off once it starts to slide in order to alleviate some of the pressure from sliding Down. Fortunately there is no signs of active infection at this time which is great news. 08/23/2020 upon evaluation today patient appears to be doing well 08/23/2020 upon evaluation today patient appears to be doing well with regard to her wound all things considered. Fortunately there does not appear to be any signs of active infection at this time which is great news. She has been tolerating the dressing changes without complication and overall I am extremely pleased with where things stand at this point. She does have her appointment with vascular in [facility] on June 9. 08/30/2020 upon evaluation today patient actually appears to be doing decently well in regard to her wound. Fortunately there is no signs of active infection which is great news. Nonetheless I  do believe that the patient is going require little bit of debridement if she is okay with me attempting that today I think that will help clean off some of the necrotic tissue. Fortunately there does not appear to be otherwise any evidence of active infection which is also great news. 09/19/2020 upon evaluation today patient appears to be doing a little better in regard to her wound as compared to previous. Fortunately there does not appear to be any signs of  active infection overall. No fever chills noted. I do believe that the Iodosorb has made this a little bit better with regard to the overall size and appearance of the wound bed though again she does still have quite a ways to go to get this to heal she still very tender to touch. 09/27/2020 upon evaluation today patient appears to actually be doing quite well with regard to her wound. This is measuring much smaller which is great news. With that being said she did see vein and vascular in [facility] and they subsequently recommended that surgery is really what she probably needs to go forward with sounds like the potential for venous ablation. With that being said the patient tells me this is just not the right time for her to be able to proceed with any type of surgery which I completely understand. Nonetheless I do believe that she would continue to benefit from compression but again that is really not something that she is able to easily do. 10/04/2020 upon evaluation today patient appears to be doing about the same in regard to her wound. This is measuring a little bit smaller but nonetheless still is open and again has some slough and biofilm noted on the surface of the wound. There does not appear to be any signs of active infection which is great news. No fevers, chills, nausea, vomiting, or diarrhea. 10/04/2020 upon evaluation today patient appears to be doing well with regard to her wound. She has been tolerating dressing changes without complication. Fortunately there does not appear to be any signs of active infection which is great news. No fevers, chills, nausea, vomiting, or diarrhea. 10/25/2020 upon evaluation today patient appears to be doing well with regard to her wound. She has been tolerating the dressing changes without complication. Fortunately there is no signs of active infection at this time. No fevers, chills, nausea, vomiting, or diarrhea. 11/01/2020 upon evaluation today patient  with regard to her wound. She has been tolerating the dressing changes without complication. Fortunately there does not appear to be any signs of infection which is great news. No fever chills noted 11/15/2020 upon evaluation today patient appears to be doing well with regard to her wound. Fortunately there is no signs of active infection at this time. No fevers, chills, nausea, vomiting, or diarrhea. With that being said she continues to have a significant amount of pain at the site even though this is very close to complete closure. She also had several varicose veins around the area which were also problematic. Overall however I feel like the patient is making excellent progress. 11/28/2020 upon evaluation today patient appears to be doing well with regard to her leg ulcer. Again were not really able to debride or compression wrap her due to discomfort and pain. She does not allow for that. With that being said we have been using Iodosorb which does seem to be doing decently well. Fortunately there is no signs of active infection at this time which is great news. No  fevers, chills, nausea, vomiting, or diarrhea. 8/31; patient presents for 2-week follow-up. She has been using Iodosorb. She reports that the wound is closed. She denies signs of infection. Readmission: 10-27-2021 upon evaluation this is a patient that presents today whom I have previously seen this is pretty much for the same issue though I think a little bit higher than the last time I saw her. She does have a history of chronic venous insufficiency and hypertension along with varicose veins. Subsequently she does have an ulceration which spontaneously ruptured she has not been wearing any compression which I think is a big part of the issue here. We discussed this before I really think she probably needs to be wearing her compression therapy, she probably needs lymphedema pumps if she can ever wear the compression for a significant  amount of time to get these, and subsequently also think that she needs to be elevating her legs is much as possible she may even need some vascular intervention in regard to her veins. All of this was reiterated and discussed with her today to reinforce what needs to happen in order to ensure that her legs do not get a lot worse. The patient voiced understanding. She tells me that she knows because she is seeing her mom go through a lot of this as well how bad things can get. [name], [name] ([id number]) [id number] Page 3 of 11 11-03-2021 upon evaluation today patient appears to be doing well with regard to her wound. Fortunately there does not appear to be any signs of active infection at this time. She is measuring a little bit bigger but I think this is because the wound is actually cleaning up a bit here. 7/27; left lateral leg wound not any smaller but perhaps with a cleaner surface. She is using Iodoflex to help with the latter and using Tubigrip. She has chronic venous insufficiency with secondary lymphedema. She is apparently followed by vein and vascular and is being scheduled for an ablation 11-17-2021 upon evaluation today patient appears to be doing well with regard to her wound this is actually showing signs of improvement which is great news. Fortunately I do not see any evidence of active infection locally or systemically at this time which is great news. No fevers, chills, nausea, vomiting, or diarrhea. 11-24-2021 upon evaluation today patient's wound is actually showing signs of significant improvement. Unfortunately she had quite a bit of pain with debridement last week I do believe it was beneficial but at the same time she is doing much better but still really does not want this debrided again I think being that it is looking a whole lot better I would try to avoid that today especially since it caused her so much discomfort that is really not the goal and  I explained that to the patient today she voiced understanding and knows that it needed to be done but still states that it was quite painful. 11-30-2021 upon evaluation today patient appears to be doing excellent in regard to her wound this is actually showing signs of excellent improvement I am very pleased with where things stand. She does have her venous ablation appointment for October 17. 12-21-2021 upon evaluation today patient appears to be doing better in regard to her wound this is measuring smaller and looking better as well. Fortunately I do not see any signs of active infection locally or systemically which is great news. 01-01-2022 upon evaluation today patient appears to be doing well currently in  regard to her wound. She is showing signs of improvement which is great news and overall I do not see any signs of active infection locally or systemically at this time. 01-08-2022 upon evaluation today patient appears to be doing well currently in regard to her wound she is actually showing signs of significant improvement which is great news. Fortunately I do not see any evidence of active infection locally or systemically at this time. I do believe that we are on the right track. She also has her appointment October 19 for the venous ablation. 01-16-2022 upon evaluation today patient's wound actually is showing some signs of improvement although this is very slow. Fortunately I do not see any evidence of infection at this time. The volume is a little bit more although the size is smaller I think this is due to the fact that we are slowly cleaning this area out effectively. 11/6 continued improvement the patient is using Iodoflex and a Tubigrip E. She was supposed to have venous surgery at vein and vascular in [place] however somehow this is gotten delayed till December 14. 02-27-2022 upon evaluation today patient's wound is actually showing signs of being completely healed. Fortunately I do  not see any evidence of active infection locally or systemically which is great news and overall I am extremely pleased with where we stand today. Unfortunately she does tell me that she postpone her venous ablation surgery until December 14 she tells me that she was not ready "financially" for this. 04/18/2022; Ms. Murial Beam is a 37 year old female with a past medical history of venous insufficiency that presents the clinic for a left lower extremity wound. She was seen almost 2 months ago for the same wound. This had healed with Iodoflex and Tubigrip. She states that the wound recently reopened. She has seen vein and vascular and plan is for ligation and stripping of the left great saphenous vein. She is not sure when this procedure is going to be scheduled. She has canceled it once before. She has had office compression wraps in the past however these do not stay on and create more of an issue for her. She would like to avoid this. She currently denies signs of infection. 1/10; patient presents for follow-up. She has been using Iodoflex to the wound bed. She states she has been using her Tubigrip however she does not have this on today. She has no issues or complaints today. She denies signs of infection. 1/17; patient presents for follow-up. She has not been using Iodoflex to the wound bed. She reports acute pain. She denies increased warmth, erythema or purulent drainage to the left lower extremity. She states she has been using Tubigrip. She has information to order the compression stockings but has not obtained them. 1/24; Patient had a wound culture done at last clinic visit that grew Proteus mirabilis and E. coli. She was prescribed levofloxacin and this should cover both bacteria. She has been taking the medication over the past week with improvement of symptoms to the wound bed. She has been using Hydrofera Blue under Tubigrip. She states the [facility] is sticking to her wound  bed. 1/31; patient presents for follow-up. She has been using Medihoney to the wound bed with improvement in healing. She has been contacted by [facility] to order her antibiotic ointment. She is not sure yet if she is able to afford this. She received her compression stockings but states they did not fit well. She is working on returning these.  She has been using Tubigrip. 2/7; patient presents for follow-up. She is been using Medihoney to the wound bed. She obtained her compression stockings and has been using these daily. She states that the [facility] antibiotic ointment is arriving in the mail tomorrow. She has no issues or complaints today. She denies systemic signs of infection. 2/14; patient presents for follow-up. She has been using Keystone antibiotic ointment to the wound bed. She states the [facility] is sticking and she has stopped this. She is been using her compression stockings daily. She does not have these on today. 2/28; patient presents for follow-up. She continues to use [facility] antibiotic ointment to the wound bed. There is been improvement in healing. She has been using her compression stockings daily. 3/12; patient presents for follow-up. She has been using Keystone antibiotic ointment to the wound bed. She is not wearing her compression stockings today but states she does wear them daily. She declines debridement today. 3/20; patient presents for follow-up. She has been using Keystone antibiotic ointment with Hydrofera Blue. She states she is wearing her compression stockings daily but she does not have them on today. 3/27; patient presents for follow-up. She has been using Keystone antibiotic ointment and hydrofera blue to the wound bed. She reports wearing compression stockings. 4/10; patient presents for follow-up. She has been using Keystone antibiotic ointment and Hydrofera Blue to the wound bed. She reports wearing compression stockings. She has no issues or  complaints today. 4/24; patient presents for follow-up. She has been using Keystone antibiotic ointment and Hydrofera Blue to the wound bed. Overall there is healthier tissue today. She reports wearing her compression stockings daily. 5/8; patient comes into clinic today with outer stocking on the left leg and with no Keystone. She has small wounds on the medial left lower leg and has been using Keystone and [employer]	[place] at home. 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 She has chronic venous hypertension and stage III lymphedema. She came in today saying she did not want debridement and really was not interested in compression wraps saying they fall down consistently. She does not have compression pumps 6/20; patient has missed her last 3 follow-up appointments. She has been using Keystone antibiotic ointment and Hydrofera Blue. We discussed potentially doing an in office wrap and patient declined Today. She states she will try the in office wrap next week. She denies signs of infection. [name], [name] ([id number]) [id number] Page 4 of 11 7/3; patient presents for follow-up. She missed her last clinic appointment. She declines doing the in office wrap today. She has been using Hydrofera Blue with antibiotic ointment. 8/14; patient has not been here since the beginning of July. She has wounds secondary to chronic venous insufficiency and lymphedema on the left medial lower leg. She has supposedly been using [facility]. She is covering this with some form of foam dressing. She is using compression stockings from elastic therapy which are probably 20/30 mmHg although we just do not seem to be able to identify that. She tells me she puts those on during the day and leaves them on all night and only changing them once a day. 9/4; patient presents for follow-up. She has been using Hydrofera Blue to the wound bed. The wound is smaller. She has  been wearing her compression stockings daily. 01-14-2023 upon evaluation today patient appears to be doing well currently in regard to her wounds. She is doing very similar to  when I last saw her. Fortunately I do not think that there is any signs of significant infection unfortunately I do believe that she is continuing to have some issues here with the open wounds and she should be seeing vascular shortly to look back into the venous ablation side of things. 01-21-2023 upon evaluation today patient appears to be doing well currently in regard to her wound. There are some slough and biofilm noted on the surface of the wound. With that being said she does not have any signs of infection at this point which is good news. No fevers, chills, nausea, vomiting, or diarrhea. Electronic Signature(s) Signed: 01/21/2023 3:38:26 PM By: Allen Derry PA-C Entered By: Allen Derry on 01/21/2023 12:38:26 -------------------------------------------------------------------------------- Physical Exam Details Patient Name: Date of Service: [name], CHOPRA MA J. 01/21/2023 2:45 PM Medical Record Number: [id number] Patient Account Number: [id number] Date of Birth/Sex: Treating RN: [birth year] (37 y.o. Freddy Finner Primary Care Provider: [name] Other Clinician: [name] Referring Provider: Treating Provider/Extender: Jaclynn Major in Treatment: 15 Constitutional Well-nourished and well-hydrated in no acute distress. Respiratory normal breathing without difficulty. Psychiatric this patient is able to make decisions and demonstrates good insight into disease process. Alert and Oriented x 3. pleasant and cooperative. Notes Upon inspection patient's wound bed showed signs of good granulation and epithelization at this point. There is however some signs of slough noted but again this is pretty well stuck on. I am going to have her try to get this off in the shower if she is unable to  do so then we will see about a light debridement next week. Electronic Signature(s) Signed: 01/21/2023 3:39:56 PM By: Allen Derry PA-C Entered By: Allen Derry on 01/21/2023 12:39:56 [name], [name] ([id number]) [id number] Page 5 of 11 -------------------------------------------------------------------------------- Physician Orders Details Patient Name: Date of Service: [name], [name] [place] 01/21/2023 2:45 PM Medical Record Number: [id number] Patient Account Number: [id number] Date of Birth/Sex: Treating RN: 13-Jun-1985 (37 y.o. 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 Bathing/ [employer] wounds with antibacterial soap and water. May shower with wound dressing protected with water repellent cover or cast protector. No tub bath. Anesthetic (Use 'Patient Medications' Section for Anesthetic Order Entry) Lidocaine applied to wound bed Edema Control - Lymphedema / Segmental Compressive Device / Other Wound #5 Left,Medial Lower Leg Patient to wear own compression stockings. Remove compression stockings every night before going to bed and put on every morning when getting up. - LLE, per Leanord Hawking recommended patient re order 30/40 compression stockings, patient given up to date measurements for left lower extremity. Elevate, Exercise Daily and A void Standing for Long Periods of  Time. Elevate legs to the level of the heart and pump ankles as often as possible Elevate leg(s) parallel to the floor when sitting. Wound Treatment Wound #5 - Lower Leg Wound Laterality: Left, Medial Cleanser: Byram Ancillary Kit - 15 Day Supply (Generic) 1 x Per Day/30 Days Discharge Instructions: Use supplies as instructed; Kit contains: (15) Saline Bullets; (15) 3x3 Gauze; 15 pr  Gloves Cleanser: Soap and Water 1 x Per Day/30 Days Discharge Instructions: Gently cleanse wound with antibacterial soap, rinse and pat dry prior to dressing wounds Cleanser: Vashe 5.8 (oz) 1 x Per Day/30 Days Discharge Instructions: Use vashe 5.8 (oz) as directed Peri-Wound Care: AandD Ointment 1 x Per Day/30 Days Discharge Instructions: Apply AandD Ointment as directed around edges to help dressing from sticking Prim Dressing: Hydrofera Blue Ready Transfer Foam, 2.5x2.5 (in/in) (Generic) 1 x Per Day/30 Days ary Discharge Instructions: Apply Hydrofera Blue Ready to wound bed as directed cut to fit wound bed Secondary Dressing: (BORDER) Zetuvit Plus SILICONE BORDER Dressing 4x4 (in/in) (Generic) 1 x Per Day/30 Days Discharge Instructions: Please do not put silicone bordered dressings under wraps. Use non-bordered dressing only. Electronic Signature(s) Signed: 01/21/2023 3:41:41 PM By: Allen Derry PA-C Signed: 01/21/2023 4:27:08 PM By: [name] Entered By: [name] on 01/21/2023 12:37:26 Render, [name] ([id number]) [id number] Page 6 of 11 -------------------------------------------------------------------------------- Problem List Details Patient Name: Date of Service: [name], [name] [place] 01/21/2023 2:45 PM Medical Record Number: [id number] Patient Account Number: [id number] Date of Birth/Sex: Treating RN: 04-08-1986 (37 y.o. 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 01/21/2023 2:45 PM Medical Record Number: [id number] Patient Account Number: [id number] Date of Birth/Sex: Treating RN: 10/31/1985 (37 y.o. Freddy Finner Primary Care Provider: [name] Other Clinician: [name] Referring Provider: Treating Provider/Extender: Jaclynn Major in Treatment: 39 Subjective Chief Complaint Information obtained from Patient [name], [name] ([id number]) [id number] Page 7 of 11 04/18/2022; Left LE Ulcer History of Present Illness (HPI) 37 year old patient who started with having ulcerations on the right lower leg on the lateral part of her ankle for about 2 weeks. She was seen in the ER at [facility] and advised to see the wound care for a consultation. No X-rays of workup was done during the ER visit and no prescription for any medications of compression wraps were given. the patient is not diabetic but does have hypertension and her medications have been reviewed by me. 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 The right and left greater  saphenous vein was found to be tortuous. Deep venous system was also not competent and there was reflux of more than 500 ms. She was then seen by Dr. Karie Schwalbe Early who recommended that the patient would not benefit from endovenous ablation and he had recommended vein stripping odd on the right side and multiple small phlebectomy procedures on the left side. the patient did not follow-up due to social economic reasons. She has not been wearing any compression stockings and has not taken any specific treatment for varicose veins for the last 3 years. 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 We have obtained some appointments to see the vascular surgeons by the end of the month and the patient would like to follow up with me at my [facility] on Wednesday, June 29. 10/14/2014 -- she could not see me yesterday in [place] and hence has come for a review today. She has a vascular workup to be done this afternoon at [facility]. She is doing fine otherwise. 10/22/2014 -- she was seen by Dr. Brantley Fling and he has recommended surgical removal of her right saphenous vein from distal thigh to saphenofemoral odd junction and stab phlebectomy's of multiple large tributary branches throughout her thigh and calf. This would be done under general anesthesia in the outpatient setting. 10/29/2014 -- she is trying to work on a surgical date and in the meanwhile we have got insurance clearance for Apligraf and we will start this next week. 7/22 2016 -- she is here for the first application of Apligraf. 11/19/2014 -- she is here for a second application of Apligraf 11/26/2014 -- she has done fine after her last application of Apligraf and is awaiting her surgery which is scheduled for August 31. 12/03/2014 -- she is doing fine and is here for her third application of Apligraf. 12/21/2014 -- She had surgery on  12/15/2014 by Dr.Early who did #1 ligation and stripping of right great saphenous vein from distal thigh to saphenofemoral junction, #2 stab phlebectomy of large tributary varicose veins in the thigh popliteal space and calf. She had an Ace wrap up to her groin and this was removed today and the Unna's boot was also removed. 12/28/2014 -- she is here for her fourth application of Apligraf. 01/06/2015 - he saw her vascular surgeon Dr. Arbie Cookey who was pleased with her progress and he has confirmed that no surgical procedures could be attempted on the left side. 01/13/2015 -- her wound looks very good and she's been having no problems whatsoever. Readmission: 07/26/2020 upon evaluation today patient presents for initial inspection here in our clinic for a new issue with her left leg although she is previously been seen due to issues with the right leg back in 2016. At that time she was seeing Dr. Arbie Cookey who is a vein/vascular specialist in [place]. He has since semiretired from what I understand. He is working out of [employer] I believe. Nonetheless she tells me at the time that there was really nothing to do for her left leg although the right leg was where they did most of the work. Subsequently she states that she is done fairly well until just in the past week where she had issues with bleeding from what appears to be varicose vein on the left leg medially. Unfortunately this has continued to be an issue although she tells me at first it was coming much more significantly Down quite a bit but nonetheless has not completely resolved. Every time she showers she notices that it starts to drain a little bit more. She does have a history of chronic venous insufficiency, lymphedema, varicose veins bilaterally, and obesity. 08/02/2020 upon evaluation today patient appears to be doing about the same in regards to the ulcer on her left leg. She has some eschar covering there is definitely some fluid  collecting underneath unfortunately. With that being said she tells me she is still having a tremendous amount of pain therefore she is really not able to allow me to clean this off very effectively to  be perfectly honest. I think we need to try to soften this up 08/16/2020 upon evaluation today patient's wound is really not doing significantly better not really states about the same. She notes that the wrap just does not seem to be staying up very well at all unfortunately. No fevers, chills, nausea, vomiting, or diarrhea. She did cut it off once it starts to slide in order to alleviate some of the pressure from sliding Down. Fortunately there is no signs of active infection at this time which is great news. 08/23/2020 upon evaluation today patient appears to be doing well 08/23/2020 upon evaluation today patient appears to be doing well with regard to her wound all things considered. Fortunately there does not appear to be any signs of active infection at this time which is great news. She has been tolerating the dressing changes without complication and overall I am extremely pleased with where things stand at this point. She does have her appointment with vascular in [facility] on June 9. 08/30/2020 upon evaluation today patient actually appears to be doing decently well in regard to her wound. Fortunately there is no signs of active infection which is great news. Nonetheless I do believe that the patient is going require little bit of debridement if she is okay with me attempting that today I think that will help clean off some of the necrotic tissue. Fortunately there does not appear to be otherwise any evidence of active infection which is also great news. 09/19/2020 upon evaluation today patient appears to be doing a little better in regard to her wound as compared to previous. Fortunately there does not appear to be any signs of active infection overall. No fever chills noted. I do believe that the  Iodosorb has made this a little bit better with regard to the overall size and appearance of the wound bed though again she does still have quite a ways to go to get this to heal she still very tender to touch. 09/27/2020 upon evaluation today patient appears to actually be doing quite well with regard to her wound. This is measuring much smaller which is great news. With that being said she did see vein and vascular in [facility] and they subsequently recommended that surgery is really what she probably needs to go forward with sounds like the potential for venous ablation. With that being said the patient tells me this is just not the right time for her to be able to proceed with any type of surgery which I completely understand. Nonetheless I do believe that she would continue to benefit from compression but again that is really not something that she is able to easily do. 10/04/2020 upon evaluation today patient appears to be doing about the same in regard to her wound. This is measuring a little bit smaller but nonetheless still is open and again has some slough and biofilm noted on the surface of the wound. There does not appear to be any signs of active infection which is great news. No fevers, chills, nausea, vomiting, or diarrhea. 10/04/2020 upon evaluation today patient appears to be doing well with regard to her wound. She has been tolerating dressing changes without complication. Fortunately there does not appear to be any signs of active infection which is great news. No fevers, chills, nausea, vomiting, or diarrhea. 10/25/2020 upon evaluation today patient appears to be doing well with regard to her wound. She has been tolerating the dressing changes without complication. Fortunately there is no signs of  active infection at this time. No fevers, chills, nausea, vomiting, or diarrhea. 11/01/2020 upon evaluation today patient with regard to her wound. She has been tolerating the dressing changes  without complication. Fortunately there does not appear to be any signs of infection which is great news. No fever chills noted 11/15/2020 upon evaluation today patient appears to be doing well with regard to her wound. Fortunately there is no signs of active infection at this time. No JYLLIAN, HAYNIE ([id number]) [id number] Page 8 of 11 fevers, chills, nausea, vomiting, or diarrhea. With that being said she continues to have a significant amount of pain at the site even though this is very close to complete closure. She also had several varicose veins around the area which were also problematic. Overall however I feel like the patient is making excellent progress. 11/28/2020 upon evaluation today patient appears to be doing well with regard to her leg ulcer. Again were not really able to debride or compression wrap her due to discomfort and pain. She does not allow for that. With that being said we have been using Iodosorb which does seem to be doing decently well. Fortunately there is no signs of active infection at this time which is great news. No fevers, chills, nausea, vomiting, or diarrhea. 8/31; patient presents for 2-week follow-up. She has been using Iodosorb. She reports that the wound is closed. She denies signs of infection. Readmission: 10-27-2021 upon evaluation this is a patient that presents today whom I have previously seen this is pretty much for the same issue though I think a little bit higher than the last time I saw her. She does have a history of chronic venous insufficiency and hypertension along with varicose veins. Subsequently she does have an ulceration which spontaneously ruptured she has not been wearing any compression which I think is a big part of the issue here. We discussed this before I really think she probably needs to be wearing her compression therapy, she probably needs lymphedema pumps if she can ever wear the compression for a  significant amount of time to get these, and subsequently also think that she needs to be elevating her legs is much as possible she may even need some vascular intervention in regard to her veins. All of this was reiterated and discussed with her today to reinforce what needs to happen in order to ensure that her legs do not get a lot worse. The patient voiced understanding. She tells me that she knows because she is seeing her mom go through a lot of this as well how bad things can get. 11-03-2021 upon evaluation today patient appears to be doing well with regard to her wound. Fortunately there does not appear to be any signs of active infection at this time. She is measuring a little bit bigger but I think this is because the wound is actually cleaning up a bit here. 7/27; left lateral leg wound not any smaller but perhaps with a cleaner surface. She is using Iodoflex to help with the latter and using Tubigrip. She has chronic venous insufficiency with secondary lymphedema. She is apparently followed by vein and vascular and is being scheduled for an ablation 11-17-2021 upon evaluation today patient appears to be doing well with regard to her wound this is actually showing signs of improvement which is great news. Fortunately I do not see any evidence of active infection locally or systemically at this time which is great news. No fevers, chills, nausea, vomiting, or diarrhea.  11-24-2021 upon evaluation today patient's wound is actually showing signs of significant improvement. Unfortunately she had quite a bit of pain with debridement last week I do believe it was beneficial but at the same time she is doing much better but still really does not want this debrided again I think being that it is looking a whole lot better I would try to avoid that today especially since it caused her so much discomfort that is really not the goal and I explained that to the patient today she voiced understanding and  knows that it needed to be done but still states that it was quite painful. 11-30-2021 upon evaluation today patient appears to be doing excellent in regard to her wound this is actually showing signs of excellent improvement I am very pleased with where things stand. She does have her venous ablation appointment for October 17. 12-21-2021 upon evaluation today patient appears to be doing better in regard to her wound this is measuring smaller and looking better as well. Fortunately I do not see any signs of active infection locally or systemically which is great news. 01-01-2022 upon evaluation today patient appears to be doing well currently in regard to her wound. She is showing signs of improvement which is great news and overall I do not see any signs of active infection locally or systemically at this time. 01-08-2022 upon evaluation today patient appears to be doing well currently in regard to her wound she is actually showing signs of significant improvement which is great news. Fortunately I do not see any evidence of active infection locally or systemically at this time. I do believe that we are on the right track. She also has her appointment October 19 for the venous ablation. 01-16-2022 upon evaluation today patient's wound actually is showing some signs of improvement although this is very slow. Fortunately I do not see any evidence of infection at this time. The volume is a little bit more although the size is smaller I think this is due to the fact that we are slowly cleaning this area out effectively. 11/6 continued improvement the patient is using Iodoflex and a Tubigrip E. She was supposed to have venous surgery at vein and vascular in [place] however somehow this is gotten delayed till December 14. 02-27-2022 upon evaluation today patient's wound is actually showing signs of being completely healed. Fortunately I do not see any evidence of active infection locally or systemically  which is great news and overall I am extremely pleased with where we stand today. Unfortunately she does tell me that she postpone her venous ablation surgery until December 14 she tells me that she was not ready "financially" for this. 04/18/2022; Ms. Taelyn Broecker is a 37 year old female with a past medical history of venous insufficiency that presents the clinic for a left lower extremity wound. She was seen almost 2 months ago for the same wound. This had healed with Iodoflex and Tubigrip. She states that the wound recently reopened. She has seen vein and vascular and plan is for ligation and stripping of the left great saphenous vein. She is not sure when this procedure is going to be scheduled. She has canceled it once before. She has had office compression wraps in the past however these do not stay on and create more of an issue for her. She would like to avoid this. She currently denies signs of infection. 1/10; patient presents for follow-up. She has been using Iodoflex to the wound bed. She  states she has been using her Tubigrip however she does not have this on today. She has no issues or complaints today. She denies signs of infection. 1/17; patient presents for follow-up. She has not been using Iodoflex to the wound bed. She reports acute pain. She denies increased warmth, erythema or purulent drainage to the left lower extremity. She states she has been using Tubigrip. She has information to order the compression stockings but has not obtained them. 1/24; Patient had a wound culture done at last clinic visit that grew Proteus mirabilis and E. coli. She was prescribed levofloxacin and this should cover both bacteria. She has been taking the medication over the past week with improvement of symptoms to the wound bed. She has been using Hydrofera Blue under Tubigrip. She states the [facility] is sticking to her wound bed. 1/31; patient presents for follow-up. She has been using Medihoney  to the wound bed with improvement in healing. She has been contacted by [facility] to order her antibiotic ointment. She is not sure yet if she is able to afford this. She received her compression stockings but states they did not fit well. She is working on returning these. She has been using Tubigrip. 2/7; patient presents for follow-up. She is been using Medihoney to the wound bed. She obtained her compression stockings and has been using these daily. She states that the [facility] antibiotic ointment is arriving in the mail tomorrow. She has no issues or complaints today. She denies systemic signs of infection. 2/14; patient presents for follow-up. She has been using Keystone antibiotic ointment to the wound bed. She states the [facility] is sticking and she has stopped this. She is been using her compression stockings daily. She does not have these on today. 2/28; patient presents for follow-up. She continues to use [facility] antibiotic ointment to the wound bed. There is been improvement in healing. She has been using her compression stockings daily. 3/12; patient presents for follow-up. She has been using Keystone antibiotic ointment to the wound bed. She is not wearing her compression stockings today [name], [name] ([id number]) [id number] Page 9 of 11 but states she does wear them daily. She declines debridement today. 3/20; patient presents for follow-up. She has been using Keystone antibiotic ointment with Hydrofera Blue. She states she is wearing her compression stockings daily but she does not have them on today. 3/27; patient presents for follow-up. She has been using Keystone antibiotic ointment and hydrofera blue to the wound bed. She reports wearing compression stockings. 4/10; patient presents for follow-up. She has been using Keystone antibiotic ointment and Hydrofera Blue to the wound bed. She reports wearing compression stockings. She has no issues  or complaints today. 4/24; patient presents for follow-up. She has been using Keystone antibiotic ointment and Hydrofera Blue to the wound bed. Overall there is healthier tissue today. She reports wearing her compression stockings daily. 5/8; patient comes into clinic today with outer stocking on the left leg and with no Keystone. She has small wounds on the medial left lower leg and has been using Keystone and [employer]	[place] at home. 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 She has chronic venous hypertension and stage III lymphedema. She came in today saying she did not want debridement and really was not interested in compression wraps saying they fall down consistently. She does not have compression pumps 6/20; patient has missed her last 3 follow-up appointments. She has been using [address]  antibiotic ointment and Hydrofera Blue. We discussed potentially doing an in office wrap and patient declined Today. She states she will try the in office wrap next week. She denies signs of infection. 7/3; patient presents for follow-up. She missed her last clinic appointment. She declines doing the in office wrap today. She has been using Hydrofera Blue with antibiotic ointment. 8/14; patient has not been here since the beginning of July. She has wounds secondary to chronic venous insufficiency and lymphedema on the left medial lower leg. She has supposedly been using [facility]. She is covering this with some form of foam dressing. She is using compression stockings from elastic therapy which are probably 20/30 mmHg although we just do not seem to be able to identify that. She tells me she puts those on during the day and leaves them on all night and only changing them once a day. 9/4; patient presents for follow-up. She has been using Hydrofera Blue to the wound bed. The wound is smaller. She has been wearing her compression stockings daily. 01-14-2023 upon evaluation today  patient appears to be doing well currently in regard to her wounds. She is doing very similar to when I last saw her. Fortunately I do not think that there is any signs of significant infection unfortunately I do believe that she is continuing to have some issues here with the open wounds and she should be seeing vascular shortly to look back into the venous ablation side of things. 01-21-2023 upon evaluation today patient appears to be doing well currently in regard to her wound. There are some slough and biofilm noted on the surface of the wound. With that being said she does not have any signs of infection at this point which is good news. No fevers, chills, nausea, vomiting, or diarrhea. Objective Constitutional Well-nourished and well-hydrated in no acute distress. Vitals Time Taken: 3:18 PM, Weight: 400 lbs, Temperature: 98.6 F, Pulse: 105 bpm, Respiratory Rate: 18 breaths/min, Blood Pressure: 107/88 mmHg. Respiratory normal breathing without difficulty. Psychiatric this patient is able to make decisions and demonstrates good insight into disease process. Alert and Oriented x 3. pleasant and cooperative. General Notes: Upon inspection patient's wound bed showed signs of good granulation and epithelization at this point. There is however some signs of slough noted but again this is pretty well stuck on. I am going to have her try to get this off in the shower if she is unable to do so then we will see about a light debridement next week. Integumentary (Hair, Skin) Wound #5 status is Open. Original cause of wound was Gradually Appeared. The date acquired was: 04/10/2022. The wound has been in treatment 39 weeks. The wound is located on the Left,Medial Lower Leg. The wound measures 0.5cm length x 3cm width x 0.2cm depth; 1.178cm^2 area and 0.236cm^3 volume. There is Fat Layer (Subcutaneous Tissue) exposed. There is a medium amount of serosanguineous drainage noted. There is medium (34-66%) red  granulation within the wound bed. There is a medium (34-66%) amount of necrotic tissue within the wound bed including Adherent Slough. Assessment Active Problems ICD-10 Chronic venous hypertension (idiopathic) with ulcer of left lower extremity Non-pressure chronic ulcer of other part of left lower leg with fat layer exposed Lymphedema, not elsewhere classified [name], [name] ([id number]) [phone].pdf Page 10 of 11 Plan Follow-up Appointments: Wound #5 Left,Medial Lower Leg: Return Appointment in 1 week. Bathing/ Shower/ Hygiene: Wash wounds with antibacterial soap and water. May shower with wound dressing protected  with water repellent cover or cast protector. No tub bath. Anesthetic (Use 'Patient Medications' Section for Anesthetic Order Entry): Lidocaine applied to wound bed Edema Control - Lymphedema / Segmental Compressive Device / Other: Wound #5 Left,Medial Lower Leg: Patient to wear own compression stockings. Remove compression stockings every night before going to bed and put on every morning when getting up. - LLE, per Leanord Hawking recommended patient re order 30/40 compression stockings, patient given up to date measurements for left lower extremity. Elevate, Exercise Daily and Avoid Standing for Long Periods of Time. Elevate legs to the level of the heart and pump ankles as often as possible Elevate leg(s) parallel to the floor when sitting. WOUND #5: - Lower Leg Wound Laterality: Left, Medial Cleanser: Byram Ancillary Kit - 15 Day Supply (Generic) 1 x Per Day/30 Days Discharge Instructions: Use supplies as instructed; Kit contains: (15) Saline Bullets; (15) 3x3 Gauze; 15 pr Gloves Cleanser: Soap and Water 1 x Per Day/30 Days Discharge Instructions: Gently cleanse wound with antibacterial soap, rinse and pat dry prior to dressing wounds Cleanser: Vashe 5.8 (oz) 1 x Per Day/30 Days Discharge Instructions: Use vashe 5.8 (oz) as directed Peri-Wound  Care: AandD Ointment 1 x Per Day/30 Days Discharge Instructions: Apply AandD Ointment as directed around edges to help dressing from sticking Prim Dressing: Hydrofera Blue Ready Transfer Foam, 2.5x2.5 (in/in) (Generic) 1 x Per Day/30 Days ary Discharge Instructions: Apply Hydrofera Blue Ready to wound bed as directed cut to fit wound bed Secondary Dressing: (BORDER) Zetuvit Plus SILICONE BORDER Dressing 4x4 (in/in) (Generic) 1 x Per Day/30 Days Discharge Instructions: Please do not put silicone bordered dressings under wraps. Use non-bordered dressing only. 1. I would recommend that we have the patient continue to monitor for any signs of infection or worsening. Obviously based on what I am seeing I do believe that we are making some pretty good headway here. 2. I am good recommend as well patient should continue to utilize the [facility] which I think is doing well. 3. I am also can recommend that the patient should continue with the bordered foam dressing to cover which I think is doing well she is on a little AandD ointment around the edges of the wound and to the dry skin in general. We will see patient back for reevaluation in 1 week here in the clinic. If anything worsens or changes patient will contact our office for additional recommendations. Electronic Signature(s) Signed: 01/21/2023 3:40:20 PM By: Allen Derry PA-C Entered By: Allen Derry on 01/21/2023 12:40:20 -------------------------------------------------------------------------------- SuperBill Details Patient Name: Date of Service: [name] Moody MA J. 01/21/2023 Medical Record Number: [id number] Patient Account Number: [id number] Date of Birth/Sex: Treating RN: 12-26-1985 (37 y.o. Freddy Finner Primary Care Provider: [name] Other Clinician: [name] Referring Provider: Treating Provider/Extender: Jaclynn Major in Treatment: 39 Diagnosis Coding ICD-10 Codes Code  Description [phone] Chronic venous hypertension (idiopathic) with ulcer of left lower extremity L97.822 Non-pressure chronic ulcer of other part of left lower leg with fat layer exposed I89.0 Lymphedema, not elsewhere classified KATEE, WENTLAND ([id number]) [phone].pdf Page 11 of 11 Facility Procedures : CPT4 Code: [id number] Description: 99213 - WOUND CARE VISIT-LEV 3 EST PT Modifier: Quantity: 1 Physician Procedures : CPT4 Code Description Modifier [id number] 99213 - WC PHYS LEVEL 3 - EST PT ICD-10 Diagnosis Description I87.312 Chronic venous hypertension (idiopathic) with ulcer of left lower extremity L97.822 Non-pressure chronic ulcer of other part of left lower leg  with fat layer exposed I89.0 Lymphedema,  not elsewhere classified Quantity: 1 Electronic Signature(s) Signed: 01/21/2023 3:40:33 PM By: Allen Derry PA-C Entered By: Allen Derry on 01/21/2023 12:40:33

## 2023-01-23 ENCOUNTER — Ambulatory Visit (HOSPITAL_COMMUNITY)
Admission: RE | Admit: 2023-01-23 | Discharge: 2023-01-23 | Disposition: A | Discharge status: Home / Self Care | Payer: BC Managed Care – PPO | Source: Ambulatory Visit | Attending: Surgery | Admitting: Surgery

## 2023-01-23 DIAGNOSIS — I83892 Varicose veins of left lower extremities with other complications: Secondary | ICD-10-CM | POA: Diagnosis not present

## 2023-01-28 ENCOUNTER — Ambulatory Visit: Payer: BC Managed Care – PPO | Admitting: Family Medicine

## 2023-01-28 DIAGNOSIS — J984 Other disorders of lung: Secondary | ICD-10-CM | POA: Insufficient documentation

## 2023-01-28 NOTE — Progress Notes (Deleted)
    SUBJECTIVE:   CHIEF COMPLAINT / HPI:   Smoking -   PFT - Lung age 37. Saw Dr. Raymondo Band 09/06  PERTINENT  PMH / PSH: HTN, Obesity  OBJECTIVE:   There were no vitals taken for this visit.  ***  ASSESSMENT/PLAN:   Assessment & Plan Restrictive lung disease  No follow-ups on file.  [name], MD [facility]

## 2023-01-29 ENCOUNTER — Ambulatory Visit: Payer: BC Managed Care – PPO | Admitting: Physician Assistant

## 2023-02-04 ENCOUNTER — Ambulatory Visit: Payer: BC Managed Care – PPO | Admitting: Surgery

## 2023-02-07 ENCOUNTER — Ambulatory Visit: Payer: BC Managed Care – PPO | Admitting: Physician Assistant

## 2023-02-11 NOTE — Progress Notes (Signed)
ADYSSON, WARHURST ([id number]) [id number] Page 1 of 9 Visit Report for 01/21/2023 Arrival Information Details Patient Name: Date of Service: [name], [name] [place]. 01/21/2023 2:45 PM Medical Record Number: [id number] Patient Account Number: [id number] Date of Birth/Sex: Treating RN: 04-10-1986 (37 y.o. Freddy Finner Primary Care Kerin Kren: [name] Other Clinician: [name] Referring Jordana Dugue: Treating Rosezella Kronick/Extender: Jaclynn Major in Treatment: 39 Visit Information History Since Last Visit All ordered tests and consults were completed: No Patient Arrived: Ambulatory Added or deleted any medications: No Arrival Time: 15:14 Any new allergies or adverse reactions: No Transfer Assistance: None Had a fall or experienced change in No Patient Identification Verified: Yes activities of daily living that may affect Secondary Verification Process Completed: Yes risk of falls: Patient Requires Transmission-Based Precautions: No Signs or symptoms of abuse/neglect since last visito No Patient Has Alerts: No Hospitalized since last visit: No Implantable device outside of the clinic excluding No cellular tissue based products placed in the center since last visit: Has Dressing in Place as Prescribed: Yes Pain Present Now: No Electronic Signature(s) Signed: 01/21/2023 4:27:08 PM By: [name] Entered By: [name] on 01/21/2023 12:17:52 -------------------------------------------------------------------------------- Clinic Level of Care Assessment Details Patient Name: Date of Service: [name], [name] [place] 01/21/2023 2:45 PM Medical Record Number: [id number] Patient Account Number: [id number] Date of Birth/Sex: Treating RN: 1985/10/11 (37 y.o. Freddy Finner Primary Care Orissa Arreaga: [name] Other Clinician: [name] Referring Calel Pisarski: Treating Dalis Beers/Extender: Jaclynn Major in  Treatment: 39 Clinic Level of Care Assessment Items TOOL 4 Quantity Score []  - 0 Use when only an EandM is performed on FOLLOW-UP visit ASSESSMENTS - Nursing Assessment / Reassessment X- 1 10 Reassessment of Co-morbidities (includes updates in patient status) X- 1 5 Reassessment of Adherence to Treatment Plan THRESA, RODA ([id number]) [id number] Page 2 of 9 ASSESSMENTS - Wound and Skin A ssessment / Reassessment X - Simple Wound Assessment / Reassessment - one wound 1 5 []  - 0 Complex Wound Assessment / Reassessment - multiple wounds []  - 0 Dermatologic / Skin Assessment (not related to wound area) ASSESSMENTS - Focused Assessment []  - 0 Circumferential Edema Measurements - multi extremities []  - 0 Nutritional Assessment / Counseling / Intervention []  - 0 Lower Extremity Assessment (monofilament, tuning fork, pulses) []  - 0 Peripheral Arterial Disease Assessment (using hand held doppler) ASSESSMENTS - Ostomy and/or Continence Assessment and Care []  - 0 Incontinence Assessment and Management []  - 0 Ostomy Care Assessment and Management (repouching, etc.) PROCESS - Coordination of Care X - Simple Patient / Family Education for ongoing care 1 15 []  - 0 Complex (extensive) Patient / Family Education for ongoing care []  - 0 Staff obtains Chiropractor, Records, T Results / Process Orders est []  - 0 Staff telephones HHA, Nursing Homes / Clarify orders / etc []  - 0 Routine Transfer to another Facility (non-emergent condition) []  - 0 Routine Hospital Admission (non-emergent condition) []  - 0 New Admissions / Manufacturing engineer / Ordering NPWT Apligraf, etc. , []  - 0 Emergency Hospital Admission (emergent condition) X- 1 10 Simple Discharge Coordination []  - 0 Complex (extensive) Discharge Coordination PROCESS - Special Needs []  - 0 Pediatric / Minor Patient Management []  - 0 Isolation Patient Management []  - 0 Hearing / Language /  Visual special needs []  - 0 Assessment of Community assistance (transportation, D/C planning, etc.) []  - 0 Additional assistance / Altered mentation []  - 0 Support Surface(s) Assessment (bed, cushion, seat, etc.) INTERVENTIONS - Wound Cleansing /  Measurement X - Simple Wound Cleansing - one wound 1 5 []  - 0 Complex Wound Cleansing - multiple wounds X- 1 5 Wound Imaging (photographs - any number of wounds) []  - 0 Wound Tracing (instead of photographs) X- 1 5 Simple Wound Measurement - one wound []  - 0 Complex Wound Measurement - multiple wounds INTERVENTIONS - Wound Dressings []  - 0 Small Wound Dressing one or multiple wounds X- 1 15 Medium Wound Dressing one or multiple wounds []  - 0 Large Wound Dressing one or multiple wounds []  - 0 Application of Medications - topical []  - 0 Application of Medications - injection INTERVENTIONS - Miscellaneous []  - 0 External ear exam JAYDY, AZCARATE ([id number]) [id number] Page 3 of 9 []  - 0 Specimen Collection (cultures, biopsies, blood, body fluids, etc.) []  - 0 Specimen(s) / Culture(s) sent or taken to Lab for analysis []  - 0 Patient Transfer (multiple staff / Michiel Sites Lift / Similar devices) []  - 0 Simple Staple / Suture removal (25 or less) []  - 0 Complex Staple / Suture removal (26 or more) []  - 0 Hypo / Hyperglycemic Management (close monitor of Blood Glucose) []  - 0 Ankle / Brachial Index (ABI) - do not check if billed separately X- 1 5 Vital Signs Has the patient been seen at the hospital within the last three years: Yes Total Score: 80 Level Of Care: New/Established - Level 3 Electronic Signature(s) Signed: 01/21/2023 4:27:08 PM By: [name] Entered By: [name] on 01/21/2023 12:38:22 -------------------------------------------------------------------------------- Encounter Discharge Information Details Patient Name: Date of Service: [name] Moody MA J. 01/21/2023 2:45 PM Medical  Record Number: [id number] Patient Account Number: [id number] Date of Birth/Sex: Treating RN: [birth year] (37 y.o. Freddy Finner Primary Care Marlissa Emerick: [name] Other Clinician: [name] Referring Owin Vignola: Treating Lorri Fukuhara/Extender: Jaclynn Major in Treatment: 39 Encounter Discharge Information Items Discharge Condition: Stable Ambulatory Status: Ambulatory Discharge Destination: Home Transportation: Private Auto Accompanied By: self Schedule Follow-up Appointment: Yes Clinical Summary of Care: Electronic Signature(s) Signed: 01/21/2023 4:27:08 PM By: [name] Entered By: [name] on 01/21/2023 12:56:32 Lower Extremity Assessment Details -------------------------------------------------------------------------------- [name] ([id number]) [id number] Page 4 of 9 Patient Name: Date of Service: REHAM, [place] 01/21/2023 2:45 PM Medical Record Number: [id number] Patient Account Number: [id number] Date of Birth/Sex: Treating RN: July 18, 1985 (37 y.o. Freddy Finner Primary Care Sherline Eberwein: [name] Other Clinician: [name] Referring Mariyam Remington: Treating Lynita Groseclose/Extender: Jaclynn Major in Treatment: 39 Edema Assessment Left: Right: Assessed: Yes No Edema: Yes Calf Left: Right: Point of Measurement: 32 cm From Medial Instep 57 cm Ankle Left: Right: Point of Measurement: 12 cm From Medial Instep 30.5 cm Vascular Assessment Left: Right: Pulses: Dorsalis Pedis Palpable: Yes Electronic Signature(s) Signed: 01/21/2023 4:27:08 PM By: [name] Signed: 02/11/2023 8:01:26 AM By: Yevonne Pax RN Entered By: [name] on 01/21/2023 12:32:47 -------------------------------------------------------------------------------- Multi Wound Chart Details Patient Name: Date of Service: [name] Moody MA J. 01/21/2023 2:45 PM Medical Record Number: [id number] Patient  Account Number: [id number] Date of Birth/Sex: Treating RN: Sep 19, 1985 (37 y.o. Freddy Finner Primary Care Edwin Baines: [name] Other Clinician: [name] Referring Willena Jeancharles: Treating Vaanya Shambaugh/Extender: Jaclynn Major in Treatment: 39 Vital Signs Height(in): Pulse(bpm): 105 Weight(lbs): 400 Blood Pressure(mmHg): 107/88 Body Mass Index(BMI): Temperature(F): 98.6 Respiratory Rate(breaths/min): 18 [5:Photos:] [N/A:N/A] Left, Medial Lower Leg N/A N/A Wound Location: JAILEI, FREILICH ([id number]) [phone].pdf Page 5 of 9 Gradually Appeared N/A N/A Wounding Event: Venous Leg Ulcer N/A N/A Primary Etiology: Hypertension,  Peripheral Venous N/A N/A Comorbid History: Disease 04/10/2022 N/A N/A Date Acquired: 84 N/A N/A Weeks of Treatment: Open N/A N/A Wound Status: No N/A N/A Wound Recurrence: 0.5x3x0.2 N/A N/A Measurements L x W x D (cm) 1.178 N/A N/A A (cm) : rea 0.236 N/A N/A Volume (cm) : 78.60% N/A N/A % Reduction in Area: 78.50% N/A N/A % Reduction in Volume: Full Thickness Without Exposed N/A N/A Classification: Support Structures Medium N/A N/A Exudate Amount: Serosanguineous N/A N/A Exudate Type: red, brown N/A N/A Exudate Color: Medium (34-66%) N/A N/A Granulation Amount: Red N/A N/A Granulation Quality: Medium (34-66%) N/A N/A Necrotic Amount: Fat Layer (Subcutaneous Tissue): Yes N/A N/A Exposed Structures: Fascia: No Tendon: No Muscle: No Joint: No Bone: No Small (1-33%) N/A N/A Epithelialization: Treatment Notes Electronic Signature(s) Signed: 01/21/2023 4:27:08 PM By: [name] Entered By: [name] on 01/21/2023 12:32:53 -------------------------------------------------------------------------------- Multi-Disciplinary Care Plan Details Patient Name: Date of Service: [name] Moody MA J. 01/21/2023 2:45 PM Medical Record Number: [id number] Patient Account Number:  [id number] Date of Birth/Sex: Treating RN: 04-19-1985 (37 y.o. Freddy Finner Primary Care Madeleyn Schwimmer: [name] Other Clinician: [name] Referring Lawrence Mitch: Treating Keelee Yankey/Extender: Jaclynn Major in Treatment: 39 Active Inactive Wound/Skin Impairment Nursing Diagnoses: Knowledge deficit related to ulceration/compromised skin integrity Goals: Patient/caregiver will verbalize understanding of skin care regimen Date Initiated: 04/18/2022 Target Resolution Date: 07/18/2022 Goal Status: Active Ulcer/skin breakdown will have a volume reduction of 30% by week 4 Date Initiated: 04/18/2022 Date Inactivated: 07/04/2022 Target Resolution Date: 05/19/2022 Goal Status: Unmet Unmet Reason: comorbidities Ulcer/skin breakdown will have a volume reduction of 50% by week 8 Date Initiated: 04/18/2022 Date Inactivated: 07/04/2022 Target Resolution Date: 06/18/2022 Goal Status: Unmet Unmet Reason: comorbidities Ulcer/skin breakdown will have a volume reduction of 80% by week [address] ([id number]) [phone].pdf Page 6 of 9 Date Initiated: 04/18/2022 Target Resolution Date: 07/18/2022 Goal Status: Active Ulcer/skin breakdown will heal within 14 weeks Date Initiated: 04/18/2022 Target Resolution Date: 08/17/2022 Goal Status: Active Interventions: Assess patient/caregiver ability to obtain necessary supplies Assess patient/caregiver ability to perform ulcer/skin care regimen upon admission and as needed Assess ulceration(s) every visit Notes: Electronic Signature(s) Signed: 01/21/2023 4:27:08 PM By: [name] Signed: 02/11/2023 8:01:26 AM By: Yevonne Pax RN Entered By: [name] on 01/21/2023 12:38:33 -------------------------------------------------------------------------------- Pain Assessment Details Patient Name: Date of Service: [name] Moody MA J. 01/21/2023 2:45 PM Medical Record Number: [id number] Patient Account Number:  [id number] Date of Birth/Sex: Treating RN: 08/17/1985 (37 y.o. Freddy Finner Primary Care Ngan Qualls: [name] Other Clinician: [name] Referring Marny Smethers: Treating Fariha Goto/Extender: Jaclynn Major in Treatment: 39 Active Problems Location of Pain Severity and Description of Pain Patient Has Paino No Site Locations Pain Management and Medication Current Pain Management: Electronic Signature(s) Signed: 01/21/2023 4:27:08 PM By: [name] Signed: 02/11/2023 8:01:26 AM By: Yevonne Pax RN Entered By: [name] on 01/21/2023 12:22:18 [name] ([id number]) [id number] Page 7 of 9 -------------------------------------------------------------------------------- Patient/Caregiver Education Details Patient Name: Date of Service: KATLEYN, CIFALDI [place] 10/7/2024andnbsp2:45 PM Medical Record Number: [id number] Patient Account Number: [id number] Date of Birth/Gender: Treating RN: 03-Mar-1986 (37 y.o. Freddy Finner Primary Care Physician: [name] Other Clinician: [name] Referring Physician: Treating Physician/Extender: Jaclynn Major in Treatment: 39 Education Assessment Education Provided To: Patient Education Topics Provided Wound/Skin Impairment: Handouts: Other: continue wound care as directed Methods: Explain/Verbal Responses: State content correctly Electronic Signature(s) Signed: 01/21/2023 4:27:08 PM By: [name] Entered By: [name] on 01/21/2023  12:54:04 -------------------------------------------------------------------------------- Wound Assessment Details Patient Name: Date of Service: SHANNONE, LOVULLO [place] 01/21/2023 2:45 PM Medical Record Number: [id number] Patient Account Number: [id number] Date of Birth/Sex: Treating RN: 01/16/1986 (37 y.o. Freddy Finner Primary Care Jusitn Salsgiver: [name] Other Clinician: [name] Referring  Mykia Holton: Treating Gahel Safley/Extender: Jaclynn Major in Treatment: 39 Wound Status Wound Number: 5 Primary Etiology: Venous Leg Ulcer Wound Location: Left, Medial Lower Leg Wound Status: Open Wounding Event: Gradually Appeared Comorbid History: Hypertension, Peripheral Venous Disease Date Acquired: 04/10/2022 Weeks Of Treatment: 39 Clustered Wound: No Photos EVALEIGH, DEESE ([id number]) [phone].pdf Page 8 of 9 Wound Measurements Length: (cm) 0.5 Width: (cm) 3 Depth: (cm) 0.2 Area: (cm) 1.178 Volume: (cm) 0.236 % Reduction in Area: 78.6% % Reduction in Volume: 78.5% Epithelialization: Small (1-33%) Wound Description Classification: Full Thickness Without Exposed Support Structures Exudate Amount: Medium Exudate Type: Serosanguineous Exudate Color: red, brown Foul Odor After Cleansing: No Slough/Fibrino Yes Wound Bed Granulation Amount: Medium (34-66%) Exposed Structure Granulation Quality: Red Fascia Exposed: No Necrotic Amount: Medium (34-66%) Fat Layer (Subcutaneous Tissue) Exposed: Yes Necrotic Quality: Adherent Slough Tendon Exposed: No Muscle Exposed: No Joint Exposed: No Bone Exposed: No Treatment Notes Wound #5 (Lower Leg) Wound Laterality: Left, Medial Cleanser Byram Ancillary Kit - 15 Day Supply Discharge Instruction: Use supplies as instructed; Kit contains: (15) Saline Bullets; (15) 3x3 Gauze; 15 pr Gloves Soap and Water Discharge Instruction: Gently cleanse wound with antibacterial soap, rinse and pat dry prior to dressing wounds Vashe 5.8 (oz) Discharge Instruction: Use vashe 5.8 (oz) as directed Peri-Wound Care AandD Ointment Discharge Instruction: Apply AandD Ointment as directed around edges to help dressing from sticking Topical Primary Dressing Hydrofera Blue Ready Transfer Foam, 2.5x2.5 (in/in) Discharge Instruction: Apply Hydrofera Blue Ready to wound bed as directed cut to fit wound  bed Secondary Dressing (BORDER) Zetuvit Plus SILICONE BORDER Dressing 4x4 (in/in) Discharge Instruction: Please do not put silicone bordered dressings under wraps. Use non-bordered dressing only. Secured With Compression Wrap Compression Stockings Add-Ons Electronic Signature(s) Signed: 01/21/2023 4:27:08 PM By: [name] Signed: 02/11/2023 8:01:26 AM By: Yevonne Pax RN Entered By: [name] on 01/21/2023 12:31:02 [name] ([id number]) [id number] Page 9 of 9 -------------------------------------------------------------------------------- Vitals Details Patient Name: Date of Service: AREEBA, COCKRELL [place] 01/21/2023 2:45 PM Medical Record Number: [id number] Patient Account Number: [id number] Date of Birth/Sex: Treating RN: 06-24-1985 (37 y.o. Freddy Finner Primary Care Jeet Shough: [name] Other Clinician: [name] Referring Jevonte Clanton: Treating Mohmmad Saleeby/Extender: Jaclynn Major in Treatment: 39 Vital Signs Time Taken: 15:18 Temperature (F): 98.6 Weight (lbs): 400 Pulse (bpm): 105 Respiratory Rate (breaths/min): 18 Blood Pressure (mmHg): 107/88 Reference Range: 80 - 120 mg / dl Electronic Signature(s) Signed: 01/21/2023 4:27:08 PM By: [name] Entered By: [name] on 01/21/2023 12:22:03

## 2023-02-14 ENCOUNTER — Encounter: Payer: BC Managed Care – PPO | Admitting: Physician Assistant

## 2023-02-14 DIAGNOSIS — I87312 Chronic venous hypertension (idiopathic) with ulcer of left lower extremity: Secondary | ICD-10-CM | POA: Diagnosis not present

## 2023-02-14 NOTE — Progress Notes (Addendum)
[name] ([id number]) [id number] Page 1 of 11 Visit Report for 02/14/2023 Chief Complaint Document Details Patient Name: Date of Service: [name], [name] [place] 02/14/2023 12:45 PM Medical Record Number: [id number] Patient Account Number: [id number] Date of Birth/Sex: Treating RN: May 05, 1985 (37 y.o. Esmeralda Links Primary Care Provider: [name] Other Clinician: Referring Provider: Treating Provider/Extender: Jaclynn Major in Treatment: 63 Information Obtained from: Patient Chief Complaint 04/18/2022; Left LE Ulcer Electronic Signature(s) Signed: 02/14/2023 1:02:11 PM By: Allen Derry PA-C Entered By: Allen Derry on 02/14/2023 13:02:11 -------------------------------------------------------------------------------- HPI Details Patient Name: Date of Service: [name]. 02/14/2023 12:45 PM Medical Record Number: [id number] Patient Account Number: [id number] Date of Birth/Sex: Treating RN: 1986/01/11 (37 y.o. Esmeralda Links Primary Care Provider: [name] Other Clinician: Referring Provider: Treating Provider/Extender: Jaclynn Major in Treatment: 57 History of Present Illness HPI Description: 37 year old patient who started with having ulcerations on the right lower leg on the lateral part of her ankle for about 2 weeks. She was seen in the ER at [facility] and advised to see the wound care for a consultation. No X-rays of workup was done during the ER visit and no prescription for any medications of compression wraps were given. the patient is not diabetic but does have hypertension and her medications have been reviewed by me. 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 The right and left greater saphenous vein was  found to be tortuous. Deep venous system was also not competent and there was reflux of more than 500 ms. She was then seen by Dr. Karie Schwalbe Early who recommended that the patient would not benefit from endovenous ablation and he had recommended vein stripping odd on the right side and multiple small phlebectomy procedures on the left side. the patient did not follow-up due to social economic reasons. She has not been wearing any compression stockings and has not taken any specific treatment for varicose veins for the last 3 years. 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 We have obtained some appointments to see the vascular surgeons by the end of the month and the patient would like to follow up with me at my [facility] on Wednesday, June 29. 10/14/2014 -- she could not see me yesterday in [place] and hence has come for a review today. She has a vascular workup to be done this afternoon at [name] ([id number]) [phone].pdf Page 2 of [address] She is doing fine otherwise. 10/22/2014 -- she was seen by Dr. Brantley Fling and he has recommended surgical removal of her right saphenous vein from distal thigh to saphenofemoral odd junction and stab phlebectomy's of multiple large tributary branches throughout her thigh and calf. This would be done under general anesthesia in the outpatient setting. 10/29/2014 -- she is trying to work on a surgical date and in the meanwhile we have got insurance clearance for Apligraf and we will start this next week. 7/22 2016 -- she is here for the first application of Apligraf. 11/19/2014 -- she is here for a second application of Apligraf 11/26/2014 -- she has done fine after her last application of Apligraf and is awaiting her surgery which is scheduled for August 31.  12/03/2014 -- she is doing fine and is here for her third  application of Apligraf. 12/21/2014 -- She had surgery on 12/15/2014 by Dr.Early who did #1 ligation and stripping of right great saphenous vein from distal thigh to saphenofemoral junction, #2 stab phlebectomy of large tributary varicose veins in the thigh popliteal space and calf. She had an Ace wrap up to her groin and this was removed today and the Unna's boot was also removed. 12/28/2014 -- she is here for her fourth application of Apligraf. 01/06/2015 - he saw her vascular surgeon Dr. Arbie Cookey who was pleased with her progress and he has confirmed that no surgical procedures could be attempted on the left side. 01/13/2015 -- her wound looks very good and she's been having no problems whatsoever. Readmission: 07/26/2020 upon evaluation today patient presents for initial inspection here in our clinic for a new issue with her left leg although she is previously been seen due to issues with the right leg back in 2016. At that time she was seeing Dr. Arbie Cookey who is a vein/vascular specialist in [place]. He has since semiretired from what I understand. He is working out of [employer] I believe. Nonetheless she tells me at the time that there was really nothing to do for her left leg although the right leg was where they did most of the work. Subsequently she states that she is done fairly well until just in the past week where she had issues with bleeding from what appears to be varicose vein on the left leg medially. Unfortunately this has continued to be an issue although she tells me at first it was coming much more significantly Down quite a bit but nonetheless has not completely resolved. Every time she showers she notices that it starts to drain a little bit more. She does have a history of chronic venous insufficiency, lymphedema, varicose veins bilaterally, and obesity. 08/02/2020 upon evaluation today patient appears to be doing about the same in regards to the ulcer on her left leg. She has  some eschar covering there is definitely some fluid collecting underneath unfortunately. With that being said she tells me she is still having a tremendous amount of pain therefore she is really not able to allow me to clean this off very effectively to be perfectly honest. I think we need to try to soften this up 08/16/2020 upon evaluation today patient's wound is really not doing significantly better not really states about the same. She notes that the wrap just does not seem to be staying up very well at all unfortunately. No fevers, chills, nausea, vomiting, or diarrhea. She did cut it off once it starts to slide in order to alleviate some of the pressure from sliding Down. Fortunately there is no signs of active infection at this time which is great news. 08/23/2020 upon evaluation today patient appears to be doing well 08/23/2020 upon evaluation today patient appears to be doing well with regard to her wound all things considered. Fortunately there does not appear to be any signs of active infection at this time which is great news. She has been tolerating the dressing changes without complication and overall I am extremely pleased with where things stand at this point. She does have her appointment with vascular in [facility] on June 9. 08/30/2020 upon evaluation today patient actually appears to be doing decently well in regard to her wound. Fortunately there is no signs of active infection which is great news. Nonetheless I do believe that the  patient is going require little bit of debridement if she is okay with me attempting that today I think that will help clean off some of the necrotic tissue. Fortunately there does not appear to be otherwise any evidence of active infection which is also great news. 09/19/2020 upon evaluation today patient appears to be doing a little better in regard to her wound as compared to previous. Fortunately there does not appear to be any signs of active infection  overall. No fever chills noted. I do believe that the Iodosorb has made this a little bit better with regard to the overall size and appearance of the wound bed though again she does still have quite a ways to go to get this to heal she still very tender to touch. 09/27/2020 upon evaluation today patient appears to actually be doing quite well with regard to her wound. This is measuring much smaller which is great news. With that being said she did see vein and vascular in [facility] and they subsequently recommended that surgery is really what she probably needs to go forward with sounds like the potential for venous ablation. With that being said the patient tells me this is just not the right time for her to be able to proceed with any type of surgery which I completely understand. Nonetheless I do believe that she would continue to benefit from compression but again that is really not something that she is able to easily do. 10/04/2020 upon evaluation today patient appears to be doing about the same in regard to her wound. This is measuring a little bit smaller but nonetheless still is open and again has some slough and biofilm noted on the surface of the wound. There does not appear to be any signs of active infection which is great news. No fevers, chills, nausea, vomiting, or diarrhea. 10/04/2020 upon evaluation today patient appears to be doing well with regard to her wound. She has been tolerating dressing changes without complication. Fortunately there does not appear to be any signs of active infection which is great news. No fevers, chills, nausea, vomiting, or diarrhea. 10/25/2020 upon evaluation today patient appears to be doing well with regard to her wound. She has been tolerating the dressing changes without complication. Fortunately there is no signs of active infection at this time. No fevers, chills, nausea, vomiting, or diarrhea. 11/01/2020 upon evaluation today patient with regard to  her wound. She has been tolerating the dressing changes without complication. Fortunately there does not appear to be any signs of infection which is great news. No fever chills noted 11/15/2020 upon evaluation today patient appears to be doing well with regard to her wound. Fortunately there is no signs of active infection at this time. No fevers, chills, nausea, vomiting, or diarrhea. With that being said she continues to have a significant amount of pain at the site even though this is very close to complete closure. She also had several varicose veins around the area which were also problematic. Overall however I feel like the patient is making excellent progress. 11/28/2020 upon evaluation today patient appears to be doing well with regard to her leg ulcer. Again were not really able to debride or compression wrap her due to discomfort and pain. She does not allow for that. With that being said we have been using Iodosorb which does seem to be doing decently well. Fortunately there is no signs of active infection at this time which is great news. No fevers, chills, nausea, vomiting,  or diarrhea. 8/31; patient presents for 2-week follow-up. She has been using Iodosorb. She reports that the wound is closed. She denies signs of infection. Readmission: 10-27-2021 upon evaluation this is a patient that presents today whom I have previously seen this is pretty much for the same issue though I think a little bit higher than the last time I saw her. She does have a history of chronic venous insufficiency and hypertension along with varicose veins. Subsequently she does have an ulceration which spontaneously ruptured she has not been wearing any compression which I think is a big part of the issue here. We discussed this before I really think she probably needs to be wearing her compression therapy, she probably needs lymphedema pumps if she can ever wear the compression for a significant amount of time to  get these, and subsequently also think that she needs to be elevating her legs is much as possible she may even need some vascular intervention in regard to her veins. All of this was reiterated and discussed with her today to reinforce what needs to happen in order to ensure that her legs do not get a lot worse. The patient voiced understanding. She tells me that she knows because she is seeing her mom go through a lot of this as well how bad things can get. [name], [name] ([id number]) [id number] Page 3 of 11 11-03-2021 upon evaluation today patient appears to be doing well with regard to her wound. Fortunately there does not appear to be any signs of active infection at this time. She is measuring a little bit bigger but I think this is because the wound is actually cleaning up a bit here. 7/27; left lateral leg wound not any smaller but perhaps with a cleaner surface. She is using Iodoflex to help with the latter and using Tubigrip. She has chronic venous insufficiency with secondary lymphedema. She is apparently followed by vein and vascular and is being scheduled for an ablation 11-17-2021 upon evaluation today patient appears to be doing well with regard to her wound this is actually showing signs of improvement which is great news. Fortunately I do not see any evidence of active infection locally or systemically at this time which is great news. No fevers, chills, nausea, vomiting, or diarrhea. 11-24-2021 upon evaluation today patient's wound is actually showing signs of significant improvement. Unfortunately she had quite a bit of pain with debridement last week I do believe it was beneficial but at the same time she is doing much better but still really does not want this debrided again I think being that it is looking a whole lot better I would try to avoid that today especially since it caused her so much discomfort that is really not the goal and I explained that  to the patient today she voiced understanding and knows that it needed to be done but still states that it was quite painful. 11-30-2021 upon evaluation today patient appears to be doing excellent in regard to her wound this is actually showing signs of excellent improvement I am very pleased with where things stand. She does have her venous ablation appointment for October 17. 12-21-2021 upon evaluation today patient appears to be doing better in regard to her wound this is measuring smaller and looking better as well. Fortunately I do not see any signs of active infection locally or systemically which is great news. 01-01-2022 upon evaluation today patient appears to be doing well currently in regard to her wound.  She is showing signs of improvement which is great news and overall I do not see any signs of active infection locally or systemically at this time. 01-08-2022 upon evaluation today patient appears to be doing well currently in regard to her wound she is actually showing signs of significant improvement which is great news. Fortunately I do not see any evidence of active infection locally or systemically at this time. I do believe that we are on the right track. She also has her appointment October 19 for the venous ablation. 01-16-2022 upon evaluation today patient's wound actually is showing some signs of improvement although this is very slow. Fortunately I do not see any evidence of infection at this time. The volume is a little bit more although the size is smaller I think this is due to the fact that we are slowly cleaning this area out effectively. 11/6 continued improvement the patient is using Iodoflex and a Tubigrip E. She was supposed to have venous surgery at vein and vascular in [place] however somehow this is gotten delayed till December 14. 02-27-2022 upon evaluation today patient's wound is actually showing signs of being completely healed. Fortunately I do not see any  evidence of active infection locally or systemically which is great news and overall I am extremely pleased with where we stand today. Unfortunately she does tell me that she postpone her venous ablation surgery until December 14 she tells me that she was not ready "financially" for this. 04/18/2022; Ms. Noble Bodie is a 37 year old female with a past medical history of venous insufficiency that presents the clinic for a left lower extremity wound. She was seen almost 2 months ago for the same wound. This had healed with Iodoflex and Tubigrip. She states that the wound recently reopened. She has seen vein and vascular and plan is for ligation and stripping of the left great saphenous vein. She is not sure when this procedure is going to be scheduled. She has canceled it once before. She has had office compression wraps in the past however these do not stay on and create more of an issue for her. She would like to avoid this. She currently denies signs of infection. 1/10; patient presents for follow-up. She has been using Iodoflex to the wound bed. She states she has been using her Tubigrip however she does not have this on today. She has no issues or complaints today. She denies signs of infection. 1/17; patient presents for follow-up. She has not been using Iodoflex to the wound bed. She reports acute pain. She denies increased warmth, erythema or purulent drainage to the left lower extremity. She states she has been using Tubigrip. She has information to order the compression stockings but has not obtained them. 1/24; Patient had a wound culture done at last clinic visit that grew Proteus mirabilis and E. coli. She was prescribed levofloxacin and this should cover both bacteria. She has been taking the medication over the past week with improvement of symptoms to the wound bed. She has been using Hydrofera Blue under Tubigrip. She states the [facility] is sticking to her wound bed. 1/31;  patient presents for follow-up. She has been using Medihoney to the wound bed with improvement in healing. She has been contacted by [facility] to order her antibiotic ointment. She is not sure yet if she is able to afford this. She received her compression stockings but states they did not fit well. She is working on returning these. She has been using  Tubigrip. 2/7; patient presents for follow-up. She is been using Medihoney to the wound bed. She obtained her compression stockings and has been using these daily. She states that the [facility] antibiotic ointment is arriving in the mail tomorrow. She has no issues or complaints today. She denies systemic signs of infection. 2/14; patient presents for follow-up. She has been using Keystone antibiotic ointment to the wound bed. She states the [facility] is sticking and she has stopped this. She is been using her compression stockings daily. She does not have these on today. 2/28; patient presents for follow-up. She continues to use [facility] antibiotic ointment to the wound bed. There is been improvement in healing. She has been using her compression stockings daily. 3/12; patient presents for follow-up. She has been using Keystone antibiotic ointment to the wound bed. She is not wearing her compression stockings today but states she does wear them daily. She declines debridement today. 3/20; patient presents for follow-up. She has been using Keystone antibiotic ointment with Hydrofera Blue. She states she is wearing her compression stockings daily but she does not have them on today. 3/27; patient presents for follow-up. She has been using Keystone antibiotic ointment and hydrofera blue to the wound bed. She reports wearing compression stockings. 4/10; patient presents for follow-up. She has been using Keystone antibiotic ointment and Hydrofera Blue to the wound bed. She reports wearing compression stockings. She has no issues or complaints  today. 4/24; patient presents for follow-up. She has been using Keystone antibiotic ointment and Hydrofera Blue to the wound bed. Overall there is healthier tissue today. She reports wearing her compression stockings daily. 5/8; patient comes into clinic today with outer stocking on the left leg and with no Keystone. She has small wounds on the medial left lower leg and has been using Keystone and [employer]	[place] at home. 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 She has chronic venous hypertension and stage III lymphedema. She came in today saying she did not want debridement and really was not interested in compression wraps saying they fall down consistently. She does not have compression pumps 6/20; patient has missed her last 3 follow-up appointments. She has been using Keystone antibiotic ointment and Hydrofera Blue. We discussed potentially doing an in office wrap and patient declined Today. She states she will try the in office wrap next week. She denies signs of infection. [name], [name] ([id number]) [id number] Page 4 of 11 7/3; patient presents for follow-up. She missed her last clinic appointment. She declines doing the in office wrap today. She has been using Hydrofera Blue with antibiotic ointment. 8/14; patient has not been here since the beginning of July. She has wounds secondary to chronic venous insufficiency and lymphedema on the left medial lower leg. She has supposedly been using [facility]. She is covering this with some form of foam dressing. She is using compression stockings from elastic therapy which are probably 20/30 mmHg although we just do not seem to be able to identify that. She tells me she puts those on during the day and leaves them on all night and only changing them once a day. 9/4; patient presents for follow-up. She has been using Hydrofera Blue to the wound bed. The wound is smaller. She has been  wearing her compression stockings daily. 01-14-2023 upon evaluation today patient appears to be doing well currently in regard to her wounds. She is doing very similar to when I last saw  her. Fortunately I do not think that there is any signs of significant infection unfortunately I do believe that she is continuing to have some issues here with the open wounds and she should be seeing vascular shortly to look back into the venous ablation side of things. 01-21-2023 upon evaluation today patient appears to be doing well currently in regard to her wound. There are some slough and biofilm noted on the surface of the wound. With that being said she does not have any signs of infection at this point which is good news. No fevers, chills, nausea, vomiting, or diarrhea. 02-14-2023 upon evaluation today patient's wound appears to be doing about the same. She still will not allow for any debridement she states it hurts too bad she does have a venous ablation is being worked up again she was post to have it previous but she did not end up going through with that and then the approval timed on that they are having to review all the evaluation and approval process at this point. She does have an appointment in 2 weeks with the vascular surgeon to go over the results of her most recent venous study. Electronic Signature(s) Signed: 02/14/2023 2:44:44 PM By: Allen Derry PA-C Entered By: Allen Derry on 02/14/2023 14:44:44 -------------------------------------------------------------------------------- Physical Exam Details Patient Name: Date of Service: [name], RIZO MA [name]. 02/14/2023 12:45 PM Medical Record Number: [id number] Patient Account Number: [id number] Date of Birth/Sex: Treating RN: September 25, 1985 (37 y.o. Esmeralda Links Primary Care Provider: [name] Other Clinician: Referring Provider: Treating Provider/Extender: Jaclynn Major in Treatment: 34 Constitutional Obese and  well-hydrated in no acute distress. Respiratory normal breathing without difficulty. Psychiatric this patient is able to make decisions and demonstrates good insight into disease process. Alert and Oriented x 3. pleasant and cooperative. Notes Upon inspection patient's wound does have some slough and biofilm noted however she does not want me to perform any debridement to clear this away. She does need to be more aggressive in the shower with cleaning this in my opinion when this is soft and would be more likely to white and clear away. She voices understanding although I am not sure how much she is actually able to do secondary to pain. Electronic Signature(s) Signed: 02/14/2023 2:45:18 PM By: Allen Derry PA-C Entered By: Allen Derry on 02/14/2023 14:45:18 Paddock, [name] ([id number]) [id number] Page 5 of 11 -------------------------------------------------------------------------------- Physician Orders Details Patient Name: Date of Service: [name], [name] [place] 02/14/2023 12:45 PM Medical Record Number: [id number] Patient Account Number: [id number] Date of Birth/Sex: Treating RN: 1985/06/14 (37 y.o. Esmeralda Links Primary Care Provider: [name] Other Clinician: Referring Provider: Treating Provider/Extender: Jaclynn Major in Treatment: 53 The following information was scribed by: [name] The information was scribed for: Allen Derry Verbal / Phone Orders: No Diagnosis Coding ICD-10 Coding Code Description I87.312 Chronic venous hypertension (idiopathic) with ulcer of left lower extremity L97.822 Non-pressure chronic ulcer of other part of left lower leg with fat layer exposed I89.0 Lymphedema, not elsewhere classified Follow-up Appointments Wound #5 Left,Medial Lower Leg Return Appointment in 1 week. Bathing/ [employer] wounds with antibacterial soap and water. May shower with wound dressing protected  with water repellent cover or cast protector. No tub bath. Anesthetic (Use 'Patient Medications' Section for Anesthetic Order Entry) Lidocaine applied to wound bed Wound Treatment Wound #5 - Lower Leg Wound Laterality: Left, Medial Cleanser: Byram Ancillary Kit - 15 Day Supply (DME) (Generic) 3 x Per Week/30  Days Discharge Instructions: Use supplies as instructed; Kit contains: (15) Saline Bullets; (15) 3x3 Gauze; 15 pr Gloves Cleanser: Soap and Water 3 x Per Week/30 Days Discharge Instructions: Gently cleanse wound with antibacterial soap, rinse and pat dry prior to dressing wounds Cleanser: Vashe 5.8 (oz) 3 x Per Week/30 Days Discharge Instructions: Use vashe 5.8 (oz) as directed Peri-Wound Care: AandD Ointment 3 x Per Week/30 Days Discharge Instructions: Apply AandD Ointment as directed around edges to help dressing from sticking Prim Dressing: Hydrofera Blue Ready Transfer Foam, 2.5x2.5 (in/in) (DME) (Dispense As Written) 3 x Per Week/30 Days ary Discharge Instructions: Apply Hydrofera Blue Ready to wound bed as directed cut to fit wound bed Secondary Dressing: (BORDER) Zetuvit Plus SILICONE BORDER Dressing 4x4 (in/in) (DME) (Dispense As Written) 3 x Per Week/30 Days Discharge Instructions: Please do not put silicone bordered dressings under wraps. Use non-bordered dressing only. Electronic Signature(s) Signed: 02/14/2023 5:26:58 PM By: Allen Derry PA-C Signed: 02/15/2023 12:43:51 PM By: [name] Entered By: [name] on 02/14/2023 13:29:49 Hanning, [name] [id number] Page 6 of 11 -------------------------------------------------------------------------------- Problem List Details Patient Name: Date of Service: [name], [name] [place] 02/14/2023 12:45 PM Medical Record Number: [id number] Patient Account Number: [id number] Date of Birth/Sex: Treating RN: March 09, 1986 (37 y.o. 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 02/14/2023 12:45 PM Medical Record Number: [id number] Patient Account Number: [id number] Date of Birth/Sex: Treating RN: July 07, 1985 (37 y.o. Esmeralda Links Primary Care Provider: [name] Other Clinician: Referring Provider: Treating Provider/Extender: Jaclynn Major in Treatment: 81 Subjective Chief Complaint Information obtained from Patient BERDINA, CHEEVER ([id number]) [id number] Page 7 of 11 04/18/2022; Left LE Ulcer History of Present Illness (HPI) 37 year old patient who started with having ulcerations on the right lower leg on the lateral part of her ankle for about 2 weeks. She was seen in the ER at [facility] and advised to see the wound care for a consultation. No X-rays of workup was done during the ER visit and no prescription for any medications of compression wraps were given. the patient is not diabetic but does have hypertension and her medications have been reviewed by me. 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 The right and left greater saphenous vein was found to be tortuous. Deep venous system was also not competent and there was reflux of more than 500 ms. She was then seen by Dr. Karie Schwalbe Early who recommended that the patient would not benefit from endovenous ablation and he had recommended vein stripping odd on the right side and multiple small phlebectomy procedures on the left side. the patient did not follow-up due to social economic reasons. She has not been wearing any compression stockings and has not taken any specific treatment for  varicose veins for the last 3 years. 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 We have obtained some appointments to see the vascular surgeons by the end of the month and the patient would like to follow up with me at my [facility] on Wednesday, June 29. 10/14/2014 -- she could not see me yesterday in [place] and hence has come for a review today. She has a vascular workup to be done this afternoon at [facility]. She is doing fine otherwise. 10/22/2014 -- she was seen by Dr. Brantley Fling and he has recommended surgical removal of her right saphenous vein from distal thigh to saphenofemoral odd junction and stab phlebectomy's of multiple large tributary branches throughout her thigh and calf. This would be done under general anesthesia in the outpatient setting. 10/29/2014 -- she is trying to work on a surgical date and in the meanwhile we have got insurance clearance for Apligraf and we will start this next week. 7/22 2016 -- she is here for the first application of Apligraf. 11/19/2014 -- she is here for a second application of Apligraf 11/26/2014 -- she has done fine after her last application of Apligraf and is awaiting her surgery which is scheduled for August 31. 12/03/2014 -- she is doing fine and is here for her third  application of Apligraf. 12/21/2014 -- She had surgery on 12/15/2014 by Dr.Early who did #1 ligation and stripping of right great saphenous vein from distal thigh to saphenofemoral junction, #2 stab phlebectomy of large tributary varicose veins in the thigh popliteal space and calf. She had an Ace wrap up to her groin and this was removed today and the Unna's boot was also removed. 12/28/2014 -- she is here for her fourth application of Apligraf. 01/06/2015 - he saw her vascular surgeon Dr. Arbie Cookey who was pleased with her progress and he has confirmed that no surgical procedures could be attempted on the left side. 01/13/2015 -- her wound looks very good and she's been having no problems whatsoever. Readmission: 07/26/2020 upon evaluation today patient presents for initial inspection here in our clinic for a new issue with her left leg although she is previously been seen due to issues with the right leg back in 2016. At that time she was seeing Dr. Arbie Cookey who is a vein/vascular specialist in [place]. He has since semiretired from what I understand. He is working out of [employer] I believe. Nonetheless she tells me at the time that there was really nothing to do for her left leg although the right leg was where they did most of the work. Subsequently she states that she is done fairly well until just in the past week where she had issues with bleeding from what appears to be varicose vein on the left leg medially. Unfortunately this has continued to be an issue although she tells me at first it was coming much more significantly Down quite a bit but nonetheless has not completely resolved. Every time she showers she notices that it starts to drain a little bit more. She does have a history of chronic venous insufficiency, lymphedema, varicose veins bilaterally, and obesity. 08/02/2020 upon evaluation today patient appears to be doing about the same in regards to the ulcer on her left leg. She has  some eschar covering there is definitely some fluid collecting underneath unfortunately. With that being said she tells me she is still having a tremendous  amount of pain therefore she is really not able to allow me to clean this off very effectively to be perfectly honest. I think we need to try to soften this up 08/16/2020 upon evaluation today patient's wound is really not doing significantly better not really states about the same. She notes that the wrap just does not seem to be staying up very well at all unfortunately. No fevers, chills, nausea, vomiting, or diarrhea. She did cut it off once it starts to slide in order to alleviate some of the pressure from sliding Down. Fortunately there is no signs of active infection at this time which is great news. 08/23/2020 upon evaluation today patient appears to be doing well 08/23/2020 upon evaluation today patient appears to be doing well with regard to her wound all things considered. Fortunately there does not appear to be any signs of active infection at this time which is great news. She has been tolerating the dressing changes without complication and overall I am extremely pleased with where things stand at this point. She does have her appointment with vascular in [facility] on June 9. 08/30/2020 upon evaluation today patient actually appears to be doing decently well in regard to her wound. Fortunately there is no signs of active infection which is great news. Nonetheless I do believe that the patient is going require little bit of debridement if she is okay with me attempting that today I think that will help clean off some of the necrotic tissue. Fortunately there does not appear to be otherwise any evidence of active infection which is also great news. 09/19/2020 upon evaluation today patient appears to be doing a little better in regard to her wound as compared to previous. Fortunately there does not appear to be any signs of active infection  overall. No fever chills noted. I do believe that the Iodosorb has made this a little bit better with regard to the overall size and appearance of the wound bed though again she does still have quite a ways to go to get this to heal she still very tender to touch. 09/27/2020 upon evaluation today patient appears to actually be doing quite well with regard to her wound. This is measuring much smaller which is great news. With that being said she did see vein and vascular in [facility] and they subsequently recommended that surgery is really what she probably needs to go forward with sounds like the potential for venous ablation. With that being said the patient tells me this is just not the right time for her to be able to proceed with any type of surgery which I completely understand. Nonetheless I do believe that she would continue to benefit from compression but again that is really not something that she is able to easily do. 10/04/2020 upon evaluation today patient appears to be doing about the same in regard to her wound. This is measuring a little bit smaller but nonetheless still is open and again has some slough and biofilm noted on the surface of the wound. There does not appear to be any signs of active infection which is great news. No fevers, chills, nausea, vomiting, or diarrhea. 10/04/2020 upon evaluation today patient appears to be doing well with regard to her wound. She has been tolerating dressing changes without complication. Fortunately there does not appear to be any signs of active infection which is great news. No fevers, chills, nausea, vomiting, or diarrhea. 10/25/2020 upon evaluation today patient appears to be doing well with  regard to her wound. She has been tolerating the dressing changes without complication. Fortunately there is no signs of active infection at this time. No fevers, chills, nausea, vomiting, or diarrhea. 11/01/2020 upon evaluation today patient with regard to  her wound. She has been tolerating the dressing changes without complication. Fortunately there does not appear to be any signs of infection which is great news. No fever chills noted 11/15/2020 upon evaluation today patient appears to be doing well with regard to her wound. Fortunately there is no signs of active infection at this time. No [name], [name] ([id number]) [id number] Page 8 of 11 fevers, chills, nausea, vomiting, or diarrhea. With that being said she continues to have a significant amount of pain at the site even though this is very close to complete closure. She also had several varicose veins around the area which were also problematic. Overall however I feel like the patient is making excellent progress. 11/28/2020 upon evaluation today patient appears to be doing well with regard to her leg ulcer. Again were not really able to debride or compression wrap her due to discomfort and pain. She does not allow for that. With that being said we have been using Iodosorb which does seem to be doing decently well. Fortunately there is no signs of active infection at this time which is great news. No fevers, chills, nausea, vomiting, or diarrhea. 8/31; patient presents for 2-week follow-up. She has been using Iodosorb. She reports that the wound is closed. She denies signs of infection. Readmission: 10-27-2021 upon evaluation this is a patient that presents today whom I have previously seen this is pretty much for the same issue though I think a little bit higher than the last time I saw her. She does have a history of chronic venous insufficiency and hypertension along with varicose veins. Subsequently she does have an ulceration which spontaneously ruptured she has not been wearing any compression which I think is a big part of the issue here. We discussed this before I really think she probably needs to be wearing her compression therapy, she probably needs lymphedema  pumps if she can ever wear the compression for a significant amount of time to get these, and subsequently also think that she needs to be elevating her legs is much as possible she may even need some vascular intervention in regard to her veins. All of this was reiterated and discussed with her today to reinforce what needs to happen in order to ensure that her legs do not get a lot worse. The patient voiced understanding. She tells me that she knows because she is seeing her mom go through a lot of this as well how bad things can get. 11-03-2021 upon evaluation today patient appears to be doing well with regard to her wound. Fortunately there does not appear to be any signs of active infection at this time. She is measuring a little bit bigger but I think this is because the wound is actually cleaning up a bit here. 7/27; left lateral leg wound not any smaller but perhaps with a cleaner surface. She is using Iodoflex to help with the latter and using Tubigrip. She has chronic venous insufficiency with secondary lymphedema. She is apparently followed by vein and vascular and is being scheduled for an ablation 11-17-2021 upon evaluation today patient appears to be doing well with regard to her wound this is actually showing signs of improvement which is great news. Fortunately I do not see any evidence of  active infection locally or systemically at this time which is great news. No fevers, chills, nausea, vomiting, or diarrhea. 11-24-2021 upon evaluation today patient's wound is actually showing signs of significant improvement. Unfortunately she had quite a bit of pain with debridement last week I do believe it was beneficial but at the same time she is doing much better but still really does not want this debrided again I think being that it is looking a whole lot better I would try to avoid that today especially since it caused her so much discomfort that is really not the goal and I explained that to  the patient today she voiced understanding and knows that it needed to be done but still states that it was quite painful. 11-30-2021 upon evaluation today patient appears to be doing excellent in regard to her wound this is actually showing signs of excellent improvement I am very pleased with where things stand. She does have her venous ablation appointment for October 17. 12-21-2021 upon evaluation today patient appears to be doing better in regard to her wound this is measuring smaller and looking better as well. Fortunately I do not see any signs of active infection locally or systemically which is great news. 01-01-2022 upon evaluation today patient appears to be doing well currently in regard to her wound. She is showing signs of improvement which is great news and overall I do not see any signs of active infection locally or systemically at this time. 01-08-2022 upon evaluation today patient appears to be doing well currently in regard to her wound she is actually showing signs of significant improvement which is great news. Fortunately I do not see any evidence of active infection locally or systemically at this time. I do believe that we are on the right track. She also has her appointment October 19 for the venous ablation. 01-16-2022 upon evaluation today patient's wound actually is showing some signs of improvement although this is very slow. Fortunately I do not see any evidence of infection at this time. The volume is a little bit more although the size is smaller I think this is due to the fact that we are slowly cleaning this area out effectively. 11/6 continued improvement the patient is using Iodoflex and a Tubigrip E. She was supposed to have venous surgery at vein and vascular in [place] however somehow this is gotten delayed till December 14. 02-27-2022 upon evaluation today patient's wound is actually showing signs of being completely healed. Fortunately I do not see any evidence  of active infection locally or systemically which is great news and overall I am extremely pleased with where we stand today. Unfortunately she does tell me that she postpone her venous ablation surgery until December 14 she tells me that she was not ready "financially" for this. 04/18/2022; Ms. Mckynleigh Mussell is a 37 year old female with a past medical history of venous insufficiency that presents the clinic for a left lower extremity wound. She was seen almost 2 months ago for the same wound. This had healed with Iodoflex and Tubigrip. She states that the wound recently reopened. She has seen vein and vascular and plan is for ligation and stripping of the left great saphenous vein. She is not sure when this procedure is going to be scheduled. She has canceled it once before. She has had office compression wraps in the past however these do not stay on and create more of an issue for her. She would like to avoid this. She currently  denies signs of infection. 1/10; patient presents for follow-up. She has been using Iodoflex to the wound bed. She states she has been using her Tubigrip however she does not have this on today. She has no issues or complaints today. She denies signs of infection. 1/17; patient presents for follow-up. She has not been using Iodoflex to the wound bed. She reports acute pain. She denies increased warmth, erythema or purulent drainage to the left lower extremity. She states she has been using Tubigrip. She has information to order the compression stockings but has not obtained them. 1/24; Patient had a wound culture done at last clinic visit that grew Proteus mirabilis and E. coli. She was prescribed levofloxacin and this should cover both bacteria. She has been taking the medication over the past week with improvement of symptoms to the wound bed. She has been using Hydrofera Blue under Tubigrip. She states the [facility] is sticking to her wound bed. 1/31; patient  presents for follow-up. She has been using Medihoney to the wound bed with improvement in healing. She has been contacted by [facility] to order her antibiotic ointment. She is not sure yet if she is able to afford this. She received her compression stockings but states they did not fit well. She is working on returning these. She has been using Tubigrip. 2/7; patient presents for follow-up. She is been using Medihoney to the wound bed. She obtained her compression stockings and has been using these daily. She states that the [facility] antibiotic ointment is arriving in the mail tomorrow. She has no issues or complaints today. She denies systemic signs of infection. 2/14; patient presents for follow-up. She has been using Keystone antibiotic ointment to the wound bed. She states the [facility] is sticking and she has stopped this. She is been using her compression stockings daily. She does not have these on today. 2/28; patient presents for follow-up. She continues to use [facility] antibiotic ointment to the wound bed. There is been improvement in healing. She has been using her compression stockings daily. 3/12; patient presents for follow-up. She has been using Keystone antibiotic ointment to the wound bed. She is not wearing her compression stockings today [name], [name] ([id number]) [id number] Page 9 of 11 but states she does wear them daily. She declines debridement today. 3/20; patient presents for follow-up. She has been using Keystone antibiotic ointment with Hydrofera Blue. She states she is wearing her compression stockings daily but she does not have them on today. 3/27; patient presents for follow-up. She has been using Keystone antibiotic ointment and hydrofera blue to the wound bed. She reports wearing compression stockings. 4/10; patient presents for follow-up. She has been using Keystone antibiotic ointment and Hydrofera Blue to the wound bed. She  reports wearing compression stockings. She has no issues or complaints today. 4/24; patient presents for follow-up. She has been using Keystone antibiotic ointment and Hydrofera Blue to the wound bed. Overall there is healthier tissue today. She reports wearing her compression stockings daily. 5/8; patient comes into clinic today with outer stocking on the left leg and with no Keystone. She has small wounds on the medial left lower leg and has been using Keystone and [employer]	[place] at home. 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 She has chronic venous hypertension and stage III lymphedema. She came in today saying she did not want debridement and really was not interested in compression wraps saying they fall down consistently. She  does not have compression pumps 6/20; patient has missed her last 3 follow-up appointments. She has been using Keystone antibiotic ointment and Hydrofera Blue. We discussed potentially doing an in office wrap and patient declined Today. She states she will try the in office wrap next week. She denies signs of infection. 7/3; patient presents for follow-up. She missed her last clinic appointment. She declines doing the in office wrap today. She has been using Hydrofera Blue with antibiotic ointment. 8/14; patient has not been here since the beginning of July. She has wounds secondary to chronic venous insufficiency and lymphedema on the left medial lower leg. She has supposedly been using [facility]. She is covering this with some form of foam dressing. She is using compression stockings from elastic therapy which are probably 20/30 mmHg although we just do not seem to be able to identify that. She tells me she puts those on during the day and leaves them on all night and only changing them once a day. 9/4; patient presents for follow-up. She has been using Hydrofera Blue to the wound bed. The wound is smaller. She has been wearing her  compression stockings daily. 01-14-2023 upon evaluation today patient appears to be doing well currently in regard to her wounds. She is doing very similar to when I last saw her. Fortunately I do not think that there is any signs of significant infection unfortunately I do believe that she is continuing to have some issues here with the open wounds and she should be seeing vascular shortly to look back into the venous ablation side of things. 01-21-2023 upon evaluation today patient appears to be doing well currently in regard to her wound. There are some slough and biofilm noted on the surface of the wound. With that being said she does not have any signs of infection at this point which is good news. No fevers, chills, nausea, vomiting, or diarrhea. 02-14-2023 upon evaluation today patient's wound appears to be doing about the same. She still will not allow for any debridement she states it hurts too bad she does have a venous ablation is being worked up again she was post to have it previous but she did not end up going through with that and then the approval timed on that they are having to review all the evaluation and approval process at this point. She does have an appointment in 2 weeks with the vascular surgeon to go over the results of her most recent venous study. Objective Constitutional Obese and well-hydrated in no acute distress. Vitals Time Taken: 1:04 PM, Weight: 400 lbs, Temperature: 98.2 F, Pulse: 99 bpm, Respiratory Rate: 18 breaths/min, Blood Pressure: 135/94 mmHg. Respiratory normal breathing without difficulty. Psychiatric this patient is able to make decisions and demonstrates good insight into disease process. Alert and Oriented x 3. pleasant and cooperative. General Notes: Upon inspection patient's wound does have some slough and biofilm noted however she does not want me to perform any debridement to clear this away. She does need to be more aggressive in the shower with  cleaning this in my opinion when this is soft and would be more likely to white and clear away. She voices understanding although I am not sure how much she is actually able to do secondary to pain. Integumentary (Hair, Skin) Wound #5 status is Open. Original cause of wound was Gradually Appeared. The date acquired was: 04/10/2022. The wound has been in treatment 43 weeks. The wound is located on the Left,Medial  Lower Leg. The wound measures 1cm length x 3.2cm width x 0.2cm depth; 2.513cm^2 area and 0.503cm^3 volume. There is Fat Layer (Subcutaneous Tissue) exposed. There is a medium amount of serosanguineous drainage noted. There is medium (34-66%) red granulation within the wound bed. There is a medium (34-66%) amount of necrotic tissue within the wound bed including Adherent Slough. Assessment Active Problems ICD-10 Chronic venous hypertension (idiopathic) with ulcer of left lower extremity Non-pressure chronic ulcer of other part of left lower leg with fat layer exposed Lymphedema, not elsewhere classified [name], [name] ([id number]) [id number].pdf Page 10 of 11 Plan Follow-up Appointments: Wound #5 Left,Medial Lower Leg: Return Appointment in 1 week. Bathing/ Shower/ Hygiene: Wash wounds with antibacterial soap and water. May shower with wound dressing protected with water repellent cover or cast protector. No tub bath. Anesthetic (Use 'Patient Medications' Section for Anesthetic Order Entry): Lidocaine applied to wound bed WOUND #5: - Lower Leg Wound Laterality: Left, Medial Cleanser: [employer] Ancillary Kit - 15 Day Supply (DME) (Generic) 3 x Per Week/30 Days Discharge Instructions: Use supplies as instructed; Kit contains: (15) Saline Bullets; (15) 3x3 Gauze; 15 pr Gloves Cleanser: Soap and Water 3 x Per Week/30 Days Discharge Instructions: Gently cleanse wound with antibacterial soap, rinse and pat dry prior to dressing wounds Cleanser: Vashe 5.8 (oz) 3 x  Per Week/30 Days Discharge Instructions: Use vashe 5.8 (oz) as directed Peri-Wound Care: AandD Ointment 3 x Per Week/30 Days Discharge Instructions: Apply AandD Ointment as directed around edges to help dressing from sticking Prim Dressing: Hydrofera Blue Ready Transfer Foam, 2.5x2.5 (in/in) (DME) (Dispense As Written) 3 x Per Week/30 Days ary Discharge Instructions: Apply Hydrofera Blue Ready to wound bed as directed cut to fit wound bed Secondary Dressing: (BORDER) Zetuvit Plus SILICONE BORDER Dressing 4x4 (in/in) (DME) (Dispense As Written) 3 x Per Week/30 Days Discharge Instructions: Please do not put silicone bordered dressings under wraps. Use non-bordered dressing only. 1. I would recommend based on what we are seeing that we have the patient going to continue to monitor for any signs of infection or worsening. I do believe that she is headed in the right direction towards getting the venous ablation surgery. 2. I am going to recommend as well the patient should continue with the [facility] followed by the bordered foam dressing. With 3 she is going to continue with the compression stockings she should be wearing at all times in order to keep the swelling down. Again I am not exactly sure how much she is using these or not. Nonetheless the more that she can use at the better she is to be elevating her legs much as possible as well. We will see patient back for reevaluation in 1 week here in the clinic. If anything worsens or changes patient will contact our office for additional recommendations. Electronic Signature(s) Signed: 02/14/2023 2:46:13 PM By: Allen Derry PA-C Entered By: Allen Derry on 02/14/2023 14:46:13 -------------------------------------------------------------------------------- SuperBill Details Patient Name: Date of Service: [name]. 02/14/2023 Medical Record Number: [id number] Patient Account Number: [id number] Date of Birth/Sex: Treating RN: [birth year]  (37 y.o. Esmeralda Links Primary Care Provider: [name] Other Clinician: Referring Provider: Treating Provider/Extender: Jaclynn Major in Treatment: 43 Diagnosis Coding ICD-10 Codes Code Description [phone] Chronic venous hypertension (idiopathic) with ulcer of left lower extremity L97.822 Non-pressure chronic ulcer of other part of left lower leg with fat layer exposed I89.0 Lymphedema, not elsewhere classified Facility Procedures : KRISTIANE, MORSCH CodeSTEPHANEE BARCOMB ([id number]) [id number]  [phone] Description: [phone] 13 - WOUND CARE VISIT-LEV 3 EST PT Modifier: [email or web address] Page 1 Quantity: 11 of 11 Physician Procedures : CPT4 Code Description Modifier [phone] [id number] - WC PHYS LEVEL 3 - EST PT ICD-10 Diagnosis Description I87.312 Chronic venous hypertension (idiopathic) with ulcer of left lower extremity L97.822 Non-pressure chronic ulcer of other part of left lower leg  with fat layer exposed I89.0 Lymphedema, not elsewhere classified Quantity: 1 Electronic Signature(s) Signed: 02/14/2023 2:46:33 PM By: Allen Derry PA-C Entered By: Allen Derry on 02/14/2023 14:46:33

## 2023-02-14 NOTE — Progress Notes (Addendum)
DEZERAE, FREIBERGER ([id number]) [id number] Page 1 of 9 Visit Report for 02/14/2023 Arrival Information Details Patient Name: Date of Service: JOHNETTA, SLONIKER [place]. 02/14/2023 12:45 PM Medical Record Number: [id number] Patient Account Number: [id number] Date of Birth/Sex: Treating RN: [birth year] (37 y.o. Esmeralda Links Primary Care Viktoriya Glaspy: [name] Other Clinician: Referring Tonae Livolsi: Treating Lennyn Gange/Extender: Jaclynn Major in Treatment: 26 Visit Information History Since Last Visit All ordered tests and consults were completed: No Patient Arrived: Ambulatory Added or deleted any medications: No Arrival Time: 13:02 Any new allergies or adverse reactions: No Transfer Assistance: None Had a fall or experienced change in No Patient Identification Verified: Yes activities of daily living that may affect Secondary Verification Process Completed: Yes risk of falls: Patient Requires Transmission-Based Precautions: No Signs or symptoms of abuse/neglect since last visito No Patient Has Alerts: No Hospitalized since last visit: No Implantable device outside of the clinic excluding No cellular tissue based products placed in the center since last visit: Has Dressing in Place as Prescribed: Yes Pain Present Now: Yes Electronic Signature(s) Signed: 02/15/2023 12:43:51 PM By: [name] Entered By: [name] on 02/14/2023 13:03:47 -------------------------------------------------------------------------------- Clinic Level of Care Assessment Details Patient Name: Date of Service: SHADIE, SWEATMAN [place] 02/14/2023 12:45 PM Medical Record Number: [id number] Patient Account Number: [id number] Date of Birth/Sex: Treating RN: 1985/10/09 (37 y.o. Esmeralda Links Primary Care Sherian Valenza: [name] Other Clinician: Referring Tuwanna Krausz: Treating Lamae Fosco/Extender: Jaclynn Major in Treatment: 43 Clinic Level  of Care Assessment Items TOOL 4 Quantity Score []  - 0 Use when only an EandM is performed on FOLLOW-UP visit ASSESSMENTS - Nursing Assessment / Reassessment X- 1 10 Reassessment of Co-morbidities (includes updates in patient status) X- 1 5 Reassessment of Adherence to Treatment Plan JOLLEEN, SEMAN ([id number]) [phone].pdf Page 2 of 9 ASSESSMENTS - Wound and Skin A ssessment / Reassessment X - Simple Wound Assessment / Reassessment - one wound 1 5 []  - 0 Complex Wound Assessment / Reassessment - multiple wounds []  - 0 Dermatologic / Skin Assessment (not related to wound area) ASSESSMENTS - Focused Assessment []  - 0 Circumferential Edema Measurements - multi extremities []  - 0 Nutritional Assessment / Counseling / Intervention []  - 0 Lower Extremity Assessment (monofilament, tuning fork, pulses) []  - 0 Peripheral Arterial Disease Assessment (using hand held doppler) ASSESSMENTS - Ostomy and/or Continence Assessment and Care []  - 0 Incontinence Assessment and Management []  - 0 Ostomy Care Assessment and Management (repouching, etc.) PROCESS - Coordination of Care X - Simple Patient / Family Education for ongoing care 1 15 []  - 0 Complex (extensive) Patient / Family Education for ongoing care []  - 0 Staff obtains Chiropractor, Records, T Results / Process Orders est []  - 0 Staff telephones HHA, Nursing Homes / Clarify orders / etc []  - 0 Routine Transfer to another Facility (non-emergent condition) []  - 0 Routine Hospital Admission (non-emergent condition) []  - 0 New Admissions / Manufacturing engineer / Ordering NPWT Apligraf, etc. , []  - 0 Emergency Hospital Admission (emergent condition) X- 1 10 Simple Discharge Coordination []  - 0 Complex (extensive) Discharge Coordination PROCESS - Special Needs []  - 0 Pediatric / Minor Patient Management []  - 0 Isolation Patient Management []  - 0 Hearing / Language / Visual special needs []  -  0 Assessment of Community assistance (transportation, D/C planning, etc.) []  - 0 Additional assistance / Altered mentation []  - 0 Support Surface(s) Assessment (bed, cushion, seat, etc.) INTERVENTIONS - Wound Cleansing / Measurement X -  Simple Wound Cleansing - one wound 1 5 []  - 0 Complex Wound Cleansing - multiple wounds X- 1 5 Wound Imaging (photographs - any number of wounds) []  - 0 Wound Tracing (instead of photographs) X- 1 5 Simple Wound Measurement - one wound []  - 0 Complex Wound Measurement - multiple wounds INTERVENTIONS - Wound Dressings []  - 0 Small Wound Dressing one or multiple wounds X- 1 15 Medium Wound Dressing one or multiple wounds []  - 0 Large Wound Dressing one or multiple wounds []  - 0 Application of Medications - topical []  - 0 Application of Medications - injection INTERVENTIONS - Miscellaneous []  - 0 External ear exam EMMORY, SOLIVAN ([id number]) [id number] Page 3 of 9 []  - 0 Specimen Collection (cultures, biopsies, blood, body fluids, etc.) []  - 0 Specimen(s) / Culture(s) sent or taken to Lab for analysis []  - 0 Patient Transfer (multiple staff / Michiel Sites Lift / Similar devices) []  - 0 Simple Staple / Suture removal (25 or less) []  - 0 Complex Staple / Suture removal (26 or more) []  - 0 Hypo / Hyperglycemic Management (close monitor of Blood Glucose) []  - 0 Ankle / Brachial Index (ABI) - do not check if billed separately X- 1 5 Vital Signs Has the patient been seen at the hospital within the last three years: Yes Total Score: 80 Level Of Care: New/Established - Level 3 Electronic Signature(s) Signed: 02/15/2023 12:43:51 PM By: [name] Entered By: [name] on 02/14/2023 13:25:47 -------------------------------------------------------------------------------- Encounter Discharge Information Details Patient Name: Date of Service: [name] 02/14/2023 12:45 PM Medical Record Number:  [id number] Patient Account Number: [id number] Date of Birth/Sex: Treating RN: 09-20-1985 (37 y.o. Esmeralda Links Primary Care Oscar Hank: [name] Other Clinician: Referring Ryosuke Ericksen: Treating Katalina Magri/Extender: Jaclynn Major in Treatment: 68 Encounter Discharge Information Items Discharge Condition: Stable Ambulatory Status: Ambulatory Discharge Destination: Home Transportation: Private Auto Accompanied By: self Schedule Follow-up Appointment: Yes Clinical Summary of Care: Electronic Signature(s) Signed: 02/15/2023 12:43:51 PM By: [name] Entered By: [name] on 02/14/2023 13:34:36 Lower Extremity Assessment Details -------------------------------------------------------------------------------- [name] ([id number]) [id number] Page 4 of 9 Patient Name: Date of Service: YVONNA, BRUN [place] 02/14/2023 12:45 PM Medical Record Number: [id number] Patient Account Number: [id number] Date of Birth/Sex: Treating RN: Apr 15, 1986 (37 y.o. Esmeralda Links Primary Care Angelise Petrich: [name] Other Clinician: Referring Muslima Toppins: Treating Donovan Gatchel/Extender: Jaclynn Major in Treatment: 43 Edema Assessment Left: Right: Assessed: Yes No Edema: Yes Calf Left: Right: Point of Measurement: 32 cm From Medial Instep 59.5 cm Ankle Left: Right: Point of Measurement: 12 cm From Medial Instep 31 cm Vascular Assessment Left: Right: Pulses: Dorsalis Pedis Palpable: Yes Extremity colors, hair growth, and conditions: Extremity Color: Normal Hair Growth on Extremity: Yes Temperature of Extremity: Warm Capillary Refill: < 3 seconds Toe Nail Assessment Left: Right: Thick: No Discolored: No Deformed: No Improper Length and Hygiene: No Electronic Signature(s) Signed: 02/14/2023 4:17:46 PM By: Angelina Pih Signed: 02/15/2023 12:43:51 PM By: [name] Entered By: [name] on  02/14/2023 13:11:56 -------------------------------------------------------------------------------- Multi Wound Chart Details Patient Name: Date of Service: [name] 02/14/2023 12:45 PM Medical Record Number: [id number] Patient Account Number: [id number] Date of Birth/Sex: Treating RN: 1985/09/29 (37 y.o. Esmeralda Links Primary Care Horatio Bertz: [name] Other Clinician: Referring Carrisa Keller: Treating Calyn Sivils/Extender: Jaclynn Major in Treatment: 30 Vital Signs Height(in): Pulse(bpm): 99 Weight(lbs): 400 Blood Pressure(mmHg): 135/94 Body Mass Index(BMI): Temperature(F): 98.2 Respiratory Rate(breaths/min): [address] Pearlee J ([id number])  [id number] Page 5 of 9 [5:Photos:] [N/A:N/A] Left, Medial Lower Leg N/A N/A Wound Location: Gradually Appeared N/A N/A Wounding Event: Venous Leg Ulcer N/A N/A Primary Etiology: Hypertension, Peripheral Venous N/A N/A Comorbid History: Disease 04/10/2022 N/A N/A Date Acquired: 72 N/A N/A Weeks of Treatment: Open N/A N/A Wound Status: No N/A N/A Wound Recurrence: 1x3.2x0.2 N/A N/A Measurements L x W x D (cm) 2.513 N/A N/A A (cm) : rea 0.503 N/A N/A Volume (cm) : 54.30% N/A N/A % Reduction in Area: 54.30% N/A N/A % Reduction in Volume: Full Thickness Without Exposed N/A N/A Classification: Support Structures Medium N/A N/A Exudate Amount: Serosanguineous N/A N/A Exudate Type: red, brown N/A N/A Exudate Color: Medium (34-66%) N/A N/A Granulation Amount: Red N/A N/A Granulation Quality: Medium (34-66%) N/A N/A Necrotic Amount: Fat Layer (Subcutaneous Tissue): Yes N/A N/A Exposed Structures: Fascia: No Tendon: No Muscle: No Joint: No Bone: No Small (1-33%) N/A N/A Epithelialization: Treatment Notes Electronic Signature(s) Signed: 02/15/2023 12:43:51 PM By: [name] Entered By: [name] on 02/14/2023  13:24:03 -------------------------------------------------------------------------------- Multi-Disciplinary Care Plan Details Patient Name: Date of Service: [name] 02/14/2023 12:45 PM Medical Record Number: [id number] Patient Account Number: [id number] Date of Birth/Sex: Treating RN: September 15, 1985 (37 y.o. Esmeralda Links Primary Care Laurin Morgenstern: [name] Other Clinician: Referring Devean Skoczylas: Treating Mariza Bourget/Extender: Jaclynn Major in Treatment: 49 Active Inactive Wound/Skin Impairment Nursing Diagnoses: FRANCINA, BEERY ([id number]) [phone].pdf Page 6 of 9 Knowledge deficit related to ulceration/compromised skin integrity Goals: Patient/caregiver will verbalize understanding of skin care regimen Date Initiated: 04/18/2022 Target Resolution Date: 07/18/2022 Goal Status: Active Ulcer/skin breakdown will have a volume reduction of 30% by week 4 Date Initiated: 04/18/2022 Date Inactivated: 07/04/2022 Target Resolution Date: 05/19/2022 Goal Status: Unmet Unmet Reason: comorbidities Ulcer/skin breakdown will have a volume reduction of 50% by week 8 Date Initiated: 04/18/2022 Date Inactivated: 07/04/2022 Target Resolution Date: 06/18/2022 Goal Status: Unmet Unmet Reason: comorbidities Ulcer/skin breakdown will have a volume reduction of 80% by week 12 Date Initiated: 04/18/2022 Target Resolution Date: 07/18/2022 Goal Status: Active Ulcer/skin breakdown will heal within 14 weeks Date Initiated: 04/18/2022 Target Resolution Date: 08/17/2022 Goal Status: Active Interventions: Assess patient/caregiver ability to obtain necessary supplies Assess patient/caregiver ability to perform ulcer/skin care regimen upon admission and as needed Assess ulceration(s) every visit Notes: Electronic Signature(s) Signed: 02/14/2023 4:17:46 PM By: Angelina Pih Signed: 02/15/2023 12:43:51 PM By: [name] Entered By: [name] on 02/14/2023  13:26:29 -------------------------------------------------------------------------------- Pain Assessment Details Patient Name: Date of Service: [name] 02/14/2023 12:45 PM Medical Record Number: [id number] Patient Account Number: [id number] Date of Birth/Sex: Treating RN: 20-Nov-1985 (37 y.o. Esmeralda Links Primary Care Krithika Tome: [name] Other Clinician: Referring Jackqulyn Mendel: Treating Noni Stonesifer/Extender: Jaclynn Major in Treatment: 56 Active Problems Location of Pain Severity and Description of Pain Patient Has Paino Yes Site Locations Pain Location: Pain in Ulcers Duration of the Pain. Constant / Intermittento Constant Rate the pain. Current Pain Level: [address] ([id number]) [id number] Page 7 of 9 Character of Pain Describe the Pain: Burning, Other: Stinging Pain Management and Medication Current Pain Management: Medication: Yes Cold Application: No Rest: No Massage: No Activity: No T.E.N.S.: No Heat Application: No Leg drop or elevation: No Is the Current Pain Management Adequate: Inadequate How does your wound impact your activities of daily livingo Sleep: No Bathing: No Appetite: No Relationship With Others: No Bladder Continence: No Emotions: No Bowel Continence: No Work: No Toileting: No Drive: No Dressing: No Hobbies:  No Electronic Signature(s) Signed: 02/14/2023 4:17:46 PM By: Angelina Pih Signed: 02/15/2023 12:43:51 PM By: [name] Entered By: [name] on 02/14/2023 13:09:42 -------------------------------------------------------------------------------- Patient/Caregiver Education Details Patient Name: Date of Service: [name] 10/31/2024andnbsp12:45 PM Medical Record Number: [id number] Patient Account Number: [id number] Date of Birth/Gender: Treating RN: 04/24/1985 (37 y.o. Esmeralda Links Primary Care Physician: [name] Other  Clinician: Referring Physician: Treating Physician/Extender: Jaclynn Major in Treatment: 76 Education Assessment Education Provided To: Patient Education Topics Provided Wound/Skin Impairment: Handouts: Other: continue wound care as directed Methods: Explain/Verbal Responses: State content correctly Electronic Signature(s) Signed: 02/15/2023 12:43:51 PM By: [name] Entered By: [name] on 02/14/2023 13:26:51 Gaultney, [name] [id number] Page 8 of 9 -------------------------------------------------------------------------------- Wound Assessment Details Patient Name: Date of Service: MAELI, SPACEK [place] 02/14/2023 12:45 PM Medical Record Number: [id number] Patient Account Number: [id number] Date of Birth/Sex: Treating RN: 1985-09-16 (38 y.o. Esmeralda Links Primary Care Mehran Guderian: [name] Other Clinician: Referring Shatana Saxton: Treating Dolly Harbach/Extender: Jaclynn Major in Treatment: 43 Wound Status Wound Number: 5 Primary Etiology: Venous Leg Ulcer Wound Location: Left, Medial Lower Leg Wound Status: Open Wounding Event: Gradually Appeared Comorbid History: Hypertension, Peripheral Venous Disease Date Acquired: 04/10/2022 Weeks Of Treatment: 43 Clustered Wound: No Photos Wound Measurements Length: (cm) 1 Width: (cm) 3.2 Depth: (cm) 0.2 Area: (cm) 2.513 Volume: (cm) 0.503 % Reduction in Area: 54.3% % Reduction in Volume: 54.3% Epithelialization: Small (1-33%) Wound Description Classification: Full Thickness Without Exposed Support Structures Exudate Amount: Medium Exudate Type: Serosanguineous Exudate Color: red, brown Foul Odor After Cleansing: No Slough/Fibrino Yes Wound Bed Granulation Amount: Medium (34-66%) Exposed Structure Granulation Quality: Red Fascia Exposed: No Necrotic Amount: Medium (34-66%) Fat Layer (Subcutaneous Tissue) Exposed: Yes Necrotic  Quality: Adherent Slough Tendon Exposed: No Muscle Exposed: No Joint Exposed: No Bone Exposed: No Treatment Notes Wound #5 (Lower Leg) Wound Laterality: Left, Medial Cleanser [place] Ancillary Kit - 15 Day Supply Discharge Instruction: Use supplies as instructed; Kit contains: (15) Saline Bullets; (15) 3x3 Gauze; 15 pr [address] MALYNDA, SMOLINSKI ([id number]) [phone].pdf Page 9 of 9 Discharge Instruction: Gently cleanse wound with antibacterial soap, rinse and pat dry prior to dressing wounds Vashe 5.8 (oz) Discharge Instruction: Use vashe 5.8 (oz) as directed Peri-Wound Care AandD Ointment Discharge Instruction: Apply AandD Ointment as directed around edges to help dressing from sticking Topical Primary Dressing Hydrofera Blue Ready Transfer Foam, 2.5x2.5 (in/in) Discharge Instruction: Apply Hydrofera Blue Ready to wound bed as directed cut to fit wound bed Secondary Dressing (BORDER) Zetuvit Plus SILICONE BORDER Dressing 4x4 (in/in) Discharge Instruction: Please do not put silicone bordered dressings under wraps. Use non-bordered dressing only. Secured With Compression Wrap Compression Stockings Facilities manager) Signed: 02/14/2023 4:17:46 PM By: Angelina Pih Signed: 02/15/2023 12:43:51 PM By: [name] Entered By: [name] on 02/14/2023 13:10:29 -------------------------------------------------------------------------------- Vitals Details Patient Name: Date of Service: [name] 02/14/2023 12:45 PM Medical Record Number: [id number] Patient Account Number: [id number] Date of Birth/Sex: Treating RN: [birth year] (37 y.o. Esmeralda Links Primary Care Rorey Hodges: [name] Other Clinician: Referring Keni Wafer: Treating Briany Aye/Extender: Jaclynn Major in Treatment: 22 Vital Signs Time Taken: 13:04 Temperature (F): 98.2 Weight (lbs): 400 Pulse (bpm): 99 Respiratory Rate  (breaths/min): 18 Blood Pressure (mmHg): 135/94 Reference Range: 80 - 120 mg / dl Electronic Signature(s) Signed: 02/15/2023 12:43:51 PM By: [name] Entered By: [name] on 02/14/2023 13:09:33

## 2023-02-22 ENCOUNTER — Ambulatory Visit: Payer: BC Managed Care – PPO | Admitting: Physician Assistant

## 2023-03-04 ENCOUNTER — Ambulatory Visit (INDEPENDENT_AMBULATORY_CARE_PROVIDER_SITE_OTHER): Payer: BC Managed Care – PPO | Admitting: Surgery

## 2023-03-04 ENCOUNTER — Ambulatory Visit: Payer: BC Managed Care – PPO | Admitting: Physician Assistant

## 2023-03-04 ENCOUNTER — Encounter: Payer: Self-pay | Admitting: Surgery

## 2023-03-04 VITALS — BP 143/84 | HR 89 | Temp 98.0°F | Resp 20 | Ht 66.0 in | Wt >= 6400 oz

## 2023-03-04 DIAGNOSIS — I83893 Varicose veins of bilateral lower extremities with other complications: Secondary | ICD-10-CM | POA: Diagnosis not present

## 2023-03-04 NOTE — Progress Notes (Signed)
[facility] [place]  Patient name: [name] MRN: [id number] DOB: [birth year] Sex: female   REASON FOR VISIT:    Follow up  HISOTRY OF PRESENT ILLNESS:    [name] is a 37 y.o. female who returns today for follow-up of her venous insufficiency.  She has undergone right leg vein stripping by Dr. Arbie Cookey in 2016.  I last saw her in 2023 for ulcer on the left leg.  We talked about vein stripping in the operating room.  This was scheduled but she canceled.  She now has a wound on her left leg.  She is here for further discussions   PAST MEDICAL HISTORY:   Past Medical History:  Diagnosis Date   Ankle ulcer (HCC)    right   Childhood asthma    Chronic lower back pain    GERD (gastroesophageal reflux disease)    Headache    "only when I'm hungry" (10/14/2015)   Hypertension    Obesity    Varicose veins    VSD (ventricular septal defect)    "had hole in bottom of her heart; closed up on it's own; no OR" /mom 10/14/2015)     FAMILY HISTORY:   Family History  Problem Relation Age of Onset   Diabetes Mother    Varicose Veins Mother    Hypertension Mother    Hyperlipidemia Mother    Healthy Father    Varicose Veins Sister    Cancer Sister        unsure of type but requiring hysterectomy    SOCIAL HISTORY:   Social History   Tobacco Use   Smoking status: Never   Smokeless tobacco: Never  Substance Use Topics   Alcohol use: Yes    Comment:  socially     ALLERGIES:   Allergies  Allergen Reactions   Tramadol Other (See Comments)    Did not make her feel like herself     CURRENT MEDICATIONS:   Current Outpatient Medications  Medication Sig Dispense Refill   albuterol (VENTOLIN HFA) 108 (90 Base) MCG/ACT inhaler Inhale 2 puffs into the lungs every 6 (six) hours as needed for wheezing or shortness of breath. 8 g 2   methocarbamol (ROBAXIN) 500 MG tablet Take 500 mg by mouth 4 (four) times  daily as needed for muscle spasms.     Multiple Vitamin (MULTIVITAMIN) tablet Take 2 tablets by mouth daily. Vitafusion gummies     pantoprazole (PROTONIX) 20 MG tablet TAKE 1 TABLET BY MOUTH EVERY DAY 90 tablet 0   Baclofen 5 MG TABS Take 1 tablet (5 mg total) by mouth at bedtime as needed. (Patient not taking: Reported on 12/21/2022) 15 tablet 0   cetirizine (ZYRTEC ALLERGY) 10 MG tablet Take 1 tablet (10 mg total) by mouth daily. (Patient not taking: Reported on 12/21/2022) 30 tablet 2   medroxyPROGESTERone (PROVERA) 10 MG tablet Take 1 tablet (10 mg total) by mouth daily for 10 days. 10 tablet 0   No current facility-administered medications for this visit.    REVIEW OF SYSTEMS:   [X]  denotes positive finding, [ ]  denotes negative finding Cardiac  Comments:  Chest pain or chest pressure:    Shortness of breath upon exertion:    Short of breath when lying flat:    Irregular heart rhythm:        Vascular    Pain in calf, thigh, or hip brought on by ambulation:    Pain in feet at night  that wakes you up from your sleep:     Blood clot in your veins:    Leg swelling:  x       Pulmonary    Oxygen at home:    Productive cough:     Wheezing:         Neurologic    Sudden weakness in arms or legs:     Sudden numbness in arms or legs:     Sudden onset of difficulty speaking or slurred speech:    Temporary loss of vision in one eye:     Problems with dizziness:         Gastrointestinal    Blood in stool:     Vomited blood:         Genitourinary    Burning when urinating:     Blood in urine:        Psychiatric    Major depression:         Hematologic    Bleeding problems:    Problems with blood clotting too easily:        Skin    Rashes or ulcers:        Constitutional    Fever or chills:      PHYSICAL EXAM:   Vitals:   03/04/23 0947  BP: (!) 143/84  Pulse: 89  Resp: 20  Temp: 98 F (36.7 C)  SpO2: 95%  Weight: (!) 422 lb (191.4 kg)  Height: 5\' 6"  (1.676 m)     GENERAL: The patient is a well-nourished female, in no acute distress. The vital signs are documented above. CARDIAC: There is a regular rate and rhythm.  VASCULAR: Dressing applied to the legs PULMONARY: Non-labored respirations MUSCULOSKELETAL: There are no major deformities or cyanosis. NEUROLOGIC: No focal weakness or paresthesias are detected. SKIN: There are no ulcers or rashes noted. PSYCHIATRIC: The patient has a normal affect.  STUDIES:   I have reviewed the following  +--------------+---------+------+-----------+------------+--------+  LEFT         Reflux NoRefluxReflux TimeDiameter cmsComments                          Yes                                   +--------------+---------+------+-----------+------------+--------+  CFV                    yes   >1 second                       +--------------+---------+------+-----------+------------+--------+  FV prox       no                                              +--------------+---------+------+-----------+------------+--------+  FV mid        no                                              +--------------+---------+------+-----------+------------+--------+  FV dist       no                                              +--------------+---------+------+-----------+------------+--------+  GSV at [place]              yes    >500 ms      1.47              +--------------+---------+------+-----------+------------+--------+  GSV prox thigh          yes    >500 ms      1.36              +--------------+---------+------+-----------+------------+--------+  GSV mid thigh           yes    >500 ms      1.78    tortuous  +--------------+---------+------+-----------+------------+--------+  GSV dist thigh          yes    >500 ms     0.769    tortuous  +--------------+---------+------+-----------+------------+--------+  GSV prox calf                  >500 ms      0.985              +--------------+---------+------+-----------+------------+--------+  SSV Pop [place]ossa           yes    >500 ms     0.759              +--------------+---------+------+-----------+------------+--------+  SSV prox calf           yes    >500 ms      0.62              +--------------+---------+------+-----------+------------+--------+  SSV mid calf            yes    >500 ms     0.511              +--------------+--------  MEDICAL ISSUES:   CEAP class 6, left leg: I discussed with the patient that she has significant reflux in the left saphenous vein as well as a markedly evaded saphenous vein.  Treating this will not help with healing her wound but should help with decreasing the possibility of recurrence.  Unfortunately, she has continued to increase weight and is up about 100 pounds from when she was treated by Dr. Arbie Cookey.  She cannot be done in the office due to the table weight limit.  I would consider doing her in the operating room which would be a high ligation and stripping.  I am worried about her ability to heal the wounds.  I think the best next step is to pursue weight loss options which will also help with her ulcer recurrence rate.  She is open to this idea.  I am making an appointment with the weight loss center for her today.  She will follow-up with me in 6 months to see how she is doing and we will consider intervention at that time.    Charlena Cross, MD, FACS [facility] [phone] Pager [phone]

## 2023-03-05 ENCOUNTER — Ambulatory Visit (INDEPENDENT_AMBULATORY_CARE_PROVIDER_SITE_OTHER): Payer: BC Managed Care – PPO | Admitting: Family Medicine

## 2023-03-05 ENCOUNTER — Encounter: Payer: Self-pay | Admitting: Family Medicine

## 2023-03-05 VITALS — BP 130/96 | HR 91 | Ht 66.0 in | Wt >= 6400 oz

## 2023-03-05 DIAGNOSIS — R252 Cramp and spasm: Secondary | ICD-10-CM | POA: Diagnosis not present

## 2023-03-05 DIAGNOSIS — J984 Other disorders of lung: Secondary | ICD-10-CM

## 2023-03-05 MED ORDER — ALBUTEROL SULFATE HFA 108 (90 BASE) MCG/ACT IN AERS: 2.0000 | INHALATION_SPRAY | Freq: Four times a day (QID) | RESPIRATORY_TRACT | 2 refills | Status: DC | PRN

## 2023-03-05 MED ORDER — METHOCARBAMOL 500 MG PO TABS: 500.0000 mg | ORAL_TABLET | Freq: Three times a day (TID) | ORAL | 0 refills | Status: DC | PRN

## 2023-03-05 NOTE — Assessment & Plan Note (Addendum)
PFT without full lung volumes, suggestive of restrictive lung disease. Lung age 37. Will need full PFT for categorization of lung disease. Referral to Pulmonology sent.

## 2023-03-05 NOTE — Patient Instructions (Signed)
It was great to see you! Thank you for allowing me to participate in your care!  Our plans for today:  -I refilled your albuterol inhaler and Robaxin. -I placed a referral to [facility] they should call you to schedule this.   Please arrive 15 minutes PRIOR to your next scheduled appointment time! If you do not, this affects OTHER patients' care.  Take care and seek immediate care sooner if you develop any concerns.   [name], MD, PGY-2 [facility] 3:17 PM 03/05/2023  [facility]

## 2023-03-05 NOTE — Progress Notes (Signed)
    SUBJECTIVE:   CHIEF COMPLAINT / HPI: med refill, foot pain  Cramps - Ongoing for several years. Notes up to 10 in her stomach yesterday that improve with stretching. Baclofen did not help at all. Robaxin did help. Gets the cramps every day.   Needs refill of albuterol. Has wheezing and cough. Cough is ongoing for months. Does smoke marijuana, less than x1 blunt per day. Stops because of coughing.   PERTINENT  PMH / PSH: RLD, GERD, HTN, Prediabetes, Morbid Obseity  OBJECTIVE:   BP (!) 130/96   Pulse 91   Ht 5\' 6"  (1.676 m)   Wt (!) 424 lb (192.3 kg)   SpO2 100%   BMI 68.44 kg/m   General: NAD  Neuro: A&O Cardiovascular: RRR, no murmurs, no peripheral edema Respiratory: normal WOB on RA, distant breath sounds, CTAB, no wheezes, ronchi or rales Extremities: Moving all 4 extremities equally   ASSESSMENT/PLAN:   Assessment & Plan Cramp and spasm Several workups with different providers at our clinic with negative electrolytes and magnesium in the past.  Unclear cause given odd symptomatology with intermittent different muscle group involvement and resolves with muscle relaxant and stretching.  Suspect this may be due to constant muscle strain due to obesity.  However cannot rule out autoimmune derangements and/or malignancy.  However, I suspect these are unlikely.  Repeat labs ordered below.  Patient agreeable to plan below. -CMP, CK, vitamin B12, magnesium, sed rate -Consider referral to rheumatology or neurology for [facility] for medical management of obesity Restrictive lung disease PFT without full lung volumes, suggestive of restrictive lung disease. Lung age 63. Will need full PFT for categorization of lung disease. Referral to Pulmonology sent. Morbid obesity (HCC) Referral to [facility].  Return in about 2 months (around 05/05/2023).  [name], MD [facility]

## 2023-03-05 NOTE — Assessment & Plan Note (Signed)
Several workups with different providers at our clinic with negative electrolytes and magnesium in the past.  Unclear cause given odd symptomatology with intermittent different muscle group involvement and resolves with muscle relaxant and stretching.  Suspect this may be due to constant muscle strain due to obesity.  However cannot rule out autoimmune derangements and/or malignancy.  However, I suspect these are unlikely.  Repeat labs ordered below.  Patient agreeable to plan below. -CMP, CK, vitamin B12, magnesium, sed rate -Consider referral to rheumatology or neurology for [facility] for medical management of obesity

## 2023-03-06 LAB — COMPREHENSIVE METABOLIC PANEL WITH GFR
ALT: 13 IU/L (ref 0–32)
AST: 16 IU/L (ref 0–40)
Albumin: 3.9 g/dL (ref 3.9–4.9)
Alkaline Phosphatase: 54 IU/L (ref 44–121)
BUN/Creatinine Ratio: 13 (ref 9–23)
BUN: 12 mg/dL (ref 6–20)
Bilirubin Total: 0.5 mg/dL (ref 0.0–1.2)
CO2: 29 mmol/L (ref 20–29)
Calcium: 9 mg/dL (ref 8.7–10.2)
Chloride: 102 mmol/L (ref 96–106)
Creatinine, Ser: 0.91 mg/dL (ref 0.57–1.00)
Globulin, Total: 3.2 g/dL (ref 1.5–4.5)
Glucose: 54 mg/dL — ABNORMAL LOW (ref 70–99)
Potassium: 4.2 mmol/L (ref 3.5–5.2)
Sodium: 143 mmol/L (ref 134–144)
Total Protein: 7.1 g/dL (ref 6.0–8.5)
eGFR: 83 mL/min/1.73

## 2023-03-06 LAB — MAGNESIUM: Magnesium: 1.8 mg/dL (ref 1.6–2.3)

## 2023-03-06 LAB — VITAMIN B12: Vitamin B-12: 274 pg/mL (ref 232–1245)

## 2023-03-06 LAB — SEDIMENTATION RATE: Sed Rate: 12 mm/h (ref 0–32)

## 2023-03-06 LAB — CK: Total CK: 169 U/L (ref 32–182)

## 2023-03-25 ENCOUNTER — Encounter: Payer: BC Managed Care – PPO | Attending: Physician Assistant | Admitting: Physician Assistant

## 2023-03-25 DIAGNOSIS — I10 Essential (primary) hypertension: Secondary | ICD-10-CM | POA: Diagnosis not present

## 2023-03-25 DIAGNOSIS — I872 Venous insufficiency (chronic) (peripheral): Secondary | ICD-10-CM | POA: Insufficient documentation

## 2023-03-25 DIAGNOSIS — I890 Lymphedema, not elsewhere classified: Secondary | ICD-10-CM | POA: Insufficient documentation

## 2023-03-25 DIAGNOSIS — L97822 Non-pressure chronic ulcer of other part of left lower leg with fat layer exposed: Secondary | ICD-10-CM | POA: Diagnosis present

## 2023-03-25 DIAGNOSIS — I87312 Chronic venous hypertension (idiopathic) with ulcer of left lower extremity: Secondary | ICD-10-CM | POA: Insufficient documentation

## 2023-03-25 NOTE — Progress Notes (Signed)
[name], [name] ([id number]) [id number] Page 1 of 11 Visit Report for 03/25/2023 Chief Complaint Document Details Patient Name: Date of Service: [name] [place]. 03/25/2023 3:45 PM Medical Record Number: [id number] Patient Account Number: [id number] Date of Birth/Sex: Treating RN: March 30, 1986 (37 y.o. Freddy Finner Primary Care Provider: [name] Other Clinician: [name] Referring Provider: Treating Provider/Extender: Jaclynn Major in Treatment: 48 Information Obtained from: Patient Chief Complaint 04/18/2022; Left LE Ulcer Electronic Signature(s) Signed: 03/25/2023 4:17:26 PM By: Allen Derry PA-C Entered By: Allen Derry on 03/25/2023 16:17:26 -------------------------------------------------------------------------------- HPI Details Patient Name: Date of Service: [name] Moody MA J. 03/25/2023 3:45 PM Medical Record Number: [id number] Patient Account Number: [id number] Date of Birth/Sex: Treating RN: 1985-09-12 (37 y.o. Freddy Finner Primary Care Provider: [name] Other Clinician: [name] Referring Provider: Treating Provider/Extender: Jaclynn Major in Treatment: 48 History of Present Illness HPI Description: 37 year old patient who started with having ulcerations on the right lower leg on the lateral part of her ankle for about 2 weeks. She was seen in the ER at [facility] and advised to see the wound care for a consultation. No X-rays of workup was done during the ER visit and no prescription for any medications of compression wraps were given. the patient is not diabetic but does have hypertension and her medications have been reviewed by me. 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 The right and left greater  saphenous vein was found to be tortuous. Deep venous system was also not competent and there was reflux of more than 500 ms. She was then seen by Dr. Karie Schwalbe Early who recommended that the patient would not benefit from endovenous ablation and he had recommended vein stripping odd on the right side and multiple small phlebectomy procedures on the left side. the patient did not follow-up due to social economic reasons. She has not been wearing any compression stockings and has not taken any specific treatment for varicose veins for the last 3 years. 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 We have obtained some appointments to see the vascular surgeons by the end of the month and the patient would like to follow up with me at my [facility] on Wednesday, June 29. 10/14/2014 -- she could not see me yesterday in [place] and hence has come for a review today. She has a vascular workup to be done this afternoon at [name] ([id number]) [id number] Page 2 of [address] She is doing fine otherwise. 10/22/2014 -- she was seen by Dr. Brantley Fling and he has recommended surgical removal of her right saphenous vein from distal thigh to saphenofemoral odd junction and stab phlebectomy's of multiple large tributary branches throughout her thigh and calf. This would be done under general anesthesia in the outpatient setting. 10/29/2014 -- she is trying to work on a surgical date and in the meanwhile we have got insurance clearance for Apligraf and we will start this next week. 7/22 2016 -- she is here for the first application of Apligraf. 11/19/2014 -- she is here for a second application of Apligraf 11/26/2014 -- she has done fine after her last application of Apligraf and is awaiting her surgery which is  scheduled for August 31. 12/03/2014 -- she is doing fine and is  here for her third application of Apligraf. 12/21/2014 -- She had surgery on 12/15/2014 by Dr.Early who did #1 ligation and stripping of right great saphenous vein from distal thigh to saphenofemoral junction, #2 stab phlebectomy of large tributary varicose veins in the thigh popliteal space and calf. She had an Ace wrap up to her groin and this was removed today and the Unna's boot was also removed. 12/28/2014 -- she is here for her fourth application of Apligraf. 01/06/2015 - he saw her vascular surgeon Dr. Arbie Cookey who was pleased with her progress and he has confirmed that no surgical procedures could be attempted on the left side. 01/13/2015 -- her wound looks very good and she's been having no problems whatsoever. Readmission: 07/26/2020 upon evaluation today patient presents for initial inspection here in our clinic for a new issue with her left leg although she is previously been seen due to issues with the right leg back in 2016. At that time she was seeing Dr. Arbie Cookey who is a vein/vascular specialist in [place]. He has since semiretired from what I understand. He is working out of [employer] I believe. Nonetheless she tells me at the time that there was really nothing to do for her left leg although the right leg was where they did most of the work. Subsequently she states that she is done fairly well until just in the past week where she had issues with bleeding from what appears to be varicose vein on the left leg medially. Unfortunately this has continued to be an issue although she tells me at first it was coming much more significantly Down quite a bit but nonetheless has not completely resolved. Every time she showers she notices that it starts to drain a little bit more. She does have a history of chronic venous insufficiency, lymphedema, varicose veins bilaterally, and obesity. 08/02/2020 upon evaluation today patient appears to be doing about the same in regards to the ulcer on her  left leg. She has some eschar covering there is definitely some fluid collecting underneath unfortunately. With that being said she tells me she is still having a tremendous amount of pain therefore she is really not able to allow me to clean this off very effectively to be perfectly honest. I think we need to try to soften this up 08/16/2020 upon evaluation today patient's wound is really not doing significantly better not really states about the same. She notes that the wrap just does not seem to be staying up very well at all unfortunately. No fevers, chills, nausea, vomiting, or diarrhea. She did cut it off once it starts to slide in order to alleviate some of the pressure from sliding Down. Fortunately there is no signs of active infection at this time which is great news. 08/23/2020 upon evaluation today patient appears to be doing well 08/23/2020 upon evaluation today patient appears to be doing well with regard to her wound all things considered. Fortunately there does not appear to be any signs of active infection at this time which is great news. She has been tolerating the dressing changes without complication and overall I am extremely pleased with where things stand at this point. She does have her appointment with vascular in [facility] on June 9. 08/30/2020 upon evaluation today patient actually appears to be doing decently well in regard to her wound. Fortunately there is no signs of active infection which is great news. Nonetheless I  do believe that the patient is going require little bit of debridement if she is okay with me attempting that today I think that will help clean off some of the necrotic tissue. Fortunately there does not appear to be otherwise any evidence of active infection which is also great news. 09/19/2020 upon evaluation today patient appears to be doing a little better in regard to her wound as compared to previous. Fortunately there does not appear to be any signs of  active infection overall. No fever chills noted. I do believe that the Iodosorb has made this a little bit better with regard to the overall size and appearance of the wound bed though again she does still have quite a ways to go to get this to heal she still very tender to touch. 09/27/2020 upon evaluation today patient appears to actually be doing quite well with regard to her wound. This is measuring much smaller which is great news. With that being said she did see vein and vascular in [facility] and they subsequently recommended that surgery is really what she probably needs to go forward with sounds like the potential for venous ablation. With that being said the patient tells me this is just not the right time for her to be able to proceed with any type of surgery which I completely understand. Nonetheless I do believe that she would continue to benefit from compression but again that is really not something that she is able to easily do. 10/04/2020 upon evaluation today patient appears to be doing about the same in regard to her wound. This is measuring a little bit smaller but nonetheless still is open and again has some slough and biofilm noted on the surface of the wound. There does not appear to be any signs of active infection which is great news. No fevers, chills, nausea, vomiting, or diarrhea. 10/04/2020 upon evaluation today patient appears to be doing well with regard to her wound. She has been tolerating dressing changes without complication. Fortunately there does not appear to be any signs of active infection which is great news. No fevers, chills, nausea, vomiting, or diarrhea. 10/25/2020 upon evaluation today patient appears to be doing well with regard to her wound. She has been tolerating the dressing changes without complication. Fortunately there is no signs of active infection at this time. No fevers, chills, nausea, vomiting, or diarrhea. 11/01/2020 upon evaluation today patient  with regard to her wound. She has been tolerating the dressing changes without complication. Fortunately there does not appear to be any signs of infection which is great news. No fever chills noted 11/15/2020 upon evaluation today patient appears to be doing well with regard to her wound. Fortunately there is no signs of active infection at this time. No fevers, chills, nausea, vomiting, or diarrhea. With that being said she continues to have a significant amount of pain at the site even though this is very close to complete closure. She also had several varicose veins around the area which were also problematic. Overall however I feel like the patient is making excellent progress. 11/28/2020 upon evaluation today patient appears to be doing well with regard to her leg ulcer. Again were not really able to debride or compression wrap her due to discomfort and pain. She does not allow for that. With that being said we have been using Iodosorb which does seem to be doing decently well. Fortunately there is no signs of active infection at this time which is great news. No  fevers, chills, nausea, vomiting, or diarrhea. 8/31; patient presents for 2-week follow-up. She has been using Iodosorb. She reports that the wound is closed. She denies signs of infection. Readmission: 10-27-2021 upon evaluation this is a patient that presents today whom I have previously seen this is pretty much for the same issue though I think a little bit higher than the last time I saw her. She does have a history of chronic venous insufficiency and hypertension along with varicose veins. Subsequently she does have an ulceration which spontaneously ruptured she has not been wearing any compression which I think is a big part of the issue here. We discussed this before I really think she probably needs to be wearing her compression therapy, she probably needs lymphedema pumps if she can ever wear the compression for a significant  amount of time to get these, and subsequently also think that she needs to be elevating her legs is much as possible she may even need some vascular intervention in regard to her veins. All of this was reiterated and discussed with her today to reinforce what needs to happen in order to ensure that her legs do not get a lot worse. The patient voiced understanding. She tells me that she knows because she is seeing her mom go through a lot of this as well how bad things can get. [name], [name] ([id number]) [id number] Page 3 of 11 11-03-2021 upon evaluation today patient appears to be doing well with regard to her wound. Fortunately there does not appear to be any signs of active infection at this time. She is measuring a little bit bigger but I think this is because the wound is actually cleaning up a bit here. 7/27; left lateral leg wound not any smaller but perhaps with a cleaner surface. She is using Iodoflex to help with the latter and using Tubigrip. She has chronic venous insufficiency with secondary lymphedema. She is apparently followed by vein and vascular and is being scheduled for an ablation 11-17-2021 upon evaluation today patient appears to be doing well with regard to her wound this is actually showing signs of improvement which is great news. Fortunately I do not see any evidence of active infection locally or systemically at this time which is great news. No fevers, chills, nausea, vomiting, or diarrhea. 11-24-2021 upon evaluation today patient's wound is actually showing signs of significant improvement. Unfortunately she had quite a bit of pain with debridement last week I do believe it was beneficial but at the same time she is doing much better but still really does not want this debrided again I think being that it is looking a whole lot better I would try to avoid that today especially since it caused her so much discomfort that is really not the goal and  I explained that to the patient today she voiced understanding and knows that it needed to be done but still states that it was quite painful. 11-30-2021 upon evaluation today patient appears to be doing excellent in regard to her wound this is actually showing signs of excellent improvement I am very pleased with where things stand. She does have her venous ablation appointment for October 17. 12-21-2021 upon evaluation today patient appears to be doing better in regard to her wound this is measuring smaller and looking better as well. Fortunately I do not see any signs of active infection locally or systemically which is great news. 01-01-2022 upon evaluation today patient appears to be doing well currently in  regard to her wound. She is showing signs of improvement which is great news and overall I do not see any signs of active infection locally or systemically at this time. 01-08-2022 upon evaluation today patient appears to be doing well currently in regard to her wound she is actually showing signs of significant improvement which is great news. Fortunately I do not see any evidence of active infection locally or systemically at this time. I do believe that we are on the right track. She also has her appointment October 19 for the venous ablation. 01-16-2022 upon evaluation today patient's wound actually is showing some signs of improvement although this is very slow. Fortunately I do not see any evidence of infection at this time. The volume is a little bit more although the size is smaller I think this is due to the fact that we are slowly cleaning this area out effectively. 11/6 continued improvement the patient is using Iodoflex and a Tubigrip E. She was supposed to have venous surgery at vein and vascular in [place] however somehow this is gotten delayed till December 14. 02-27-2022 upon evaluation today patient's wound is actually showing signs of being completely healed. Fortunately I do  not see any evidence of active infection locally or systemically which is great news and overall I am extremely pleased with where we stand today. Unfortunately she does tell me that she postpone her venous ablation surgery until December 14 she tells me that she was not ready "financially" for this. 04/18/2022; Ms. Ayslin Draney is a 37 year old female with a past medical history of venous insufficiency that presents the clinic for a left lower extremity wound. She was seen almost 2 months ago for the same wound. This had healed with Iodoflex and Tubigrip. She states that the wound recently reopened. She has seen vein and vascular and plan is for ligation and stripping of the left great saphenous vein. She is not sure when this procedure is going to be scheduled. She has canceled it once before. She has had office compression wraps in the past however these do not stay on and create more of an issue for her. She would like to avoid this. She currently denies signs of infection. 1/10; patient presents for follow-up. She has been using Iodoflex to the wound bed. She states she has been using her Tubigrip however she does not have this on today. She has no issues or complaints today. She denies signs of infection. 1/17; patient presents for follow-up. She has not been using Iodoflex to the wound bed. She reports acute pain. She denies increased warmth, erythema or purulent drainage to the left lower extremity. She states she has been using Tubigrip. She has information to order the compression stockings but has not obtained them. 1/24; Patient had a wound culture done at last clinic visit that grew Proteus mirabilis and E. coli. She was prescribed levofloxacin and this should cover both bacteria. She has been taking the medication over the past week with improvement of symptoms to the wound bed. She has been using Hydrofera Blue under Tubigrip. She states the [facility] is sticking to her wound  bed. 1/31; patient presents for follow-up. She has been using Medihoney to the wound bed with improvement in healing. She has been contacted by [facility] to order her antibiotic ointment. She is not sure yet if she is able to afford this. She received her compression stockings but states they did not fit well. She is working on returning these.  She has been using Tubigrip. 2/7; patient presents for follow-up. She is been using Medihoney to the wound bed. She obtained her compression stockings and has been using these daily. She states that the [facility] antibiotic ointment is arriving in the mail tomorrow. She has no issues or complaints today. She denies systemic signs of infection. 2/14; patient presents for follow-up. She has been using Keystone antibiotic ointment to the wound bed. She states the [facility] is sticking and she has stopped this. She is been using her compression stockings daily. She does not have these on today. 2/28; patient presents for follow-up. She continues to use [facility] antibiotic ointment to the wound bed. There is been improvement in healing. She has been using her compression stockings daily. 3/12; patient presents for follow-up. She has been using Keystone antibiotic ointment to the wound bed. She is not wearing her compression stockings today but states she does wear them daily. She declines debridement today. 3/20; patient presents for follow-up. She has been using Keystone antibiotic ointment with Hydrofera Blue. She states she is wearing her compression stockings daily but she does not have them on today. 3/27; patient presents for follow-up. She has been using Keystone antibiotic ointment and hydrofera blue to the wound bed. She reports wearing compression stockings. 4/10; patient presents for follow-up. She has been using Keystone antibiotic ointment and Hydrofera Blue to the wound bed. She reports wearing compression stockings. She has no issues or  complaints today. 4/24; patient presents for follow-up. She has been using Keystone antibiotic ointment and Hydrofera Blue to the wound bed. Overall there is healthier tissue today. She reports wearing her compression stockings daily. 5/8; patient comes into clinic today with outer stocking on the left leg and with no Keystone. She has small wounds on the medial left lower leg and has been using Keystone and [employer]	[place] at home. 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 She has chronic venous hypertension and stage III lymphedema. She came in today saying she did not want debridement and really was not interested in compression wraps saying they fall down consistently. She does not have compression pumps 6/20; patient has missed her last 3 follow-up appointments. She has been using Keystone antibiotic ointment and Hydrofera Blue. We discussed potentially doing an in office wrap and patient declined Today. She states she will try the in office wrap next week. She denies signs of infection. [name], [name] ([id number]) [id number] Page 4 of 11 7/3; patient presents for follow-up. She missed her last clinic appointment. She declines doing the in office wrap today. She has been using Hydrofera Blue with antibiotic ointment. 8/14; patient has not been here since the beginning of July. She has wounds secondary to chronic venous insufficiency and lymphedema on the left medial lower leg. She has supposedly been using [facility]. She is covering this with some form of foam dressing. She is using compression stockings from elastic therapy which are probably 20/30 mmHg although we just do not seem to be able to identify that. She tells me she puts those on during the day and leaves them on all night and only changing them once a day. 9/4; patient presents for follow-up. She has been using Hydrofera Blue to the wound bed. The wound is smaller. She has  been wearing her compression stockings daily. 01-14-2023 upon evaluation today patient appears to be doing well currently in regard to her wounds. She is doing very similar to  when I last saw her. Fortunately I do not think that there is any signs of significant infection unfortunately I do believe that she is continuing to have some issues here with the open wounds and she should be seeing vascular shortly to look back into the venous ablation side of things. 01-21-2023 upon evaluation today patient appears to be doing well currently in regard to her wound. There are some slough and biofilm noted on the surface of the wound. With that being said she does not have any signs of infection at this point which is good news. No fevers, chills, nausea, vomiting, or diarrhea. 02-14-2023 upon evaluation today patient's wound appears to be doing about the same. She still will not allow for any debridement she states it hurts too bad she does have a venous ablation is being worked up again she was post to have it previous but she did not end up going through with that and then the approval timed on that they are having to review all the evaluation and approval process at this point. She does have an appointment in 2 weeks with the vascular surgeon to go over the results of her most recent venous study. 03-25-2023 upon evaluation today patient appears to be doing well currently in regard to her leg overall although she is having some increased pain this may be secondary to have an infection. Fortunately I do not see any signs of active infection systemically but locally it feels like that may be what is going on here based on what I am experiencing from the patient. Electronic Signature(s) Signed: 03/25/2023 4:41:42 PM By: Allen Derry PA-C Entered By: Allen Derry on 03/25/2023 16:41:41 -------------------------------------------------------------------------------- Physical Exam Details Patient Name: Date of  Service: KIRA, GAMEL MA J. 03/25/2023 3:45 PM Medical Record Number: [id number] Patient Account Number: [id number] Date of Birth/Sex: Treating RN: 1985-05-04 (37 y.o. Freddy Finner Primary Care Provider: [name] Other Clinician: [name] Referring Provider: Treating Provider/Extender: Jaclynn Major in Treatment: 48 Constitutional Obese and well-hydrated in no acute distress. Respiratory normal breathing without difficulty. Psychiatric this patient is able to make decisions and demonstrates good insight into disease process. Alert and Oriented x 3. pleasant and cooperative. Notes Upon evaluation patient's wound is really not doing much better. She unfortunately is not can to be a candidate until she is able to lose some weight for the ablation that she was looking toward. With that being said she is gena try to get in with a dietitian in the meantime she tells me that right now she is having quick upsurge in pain and unfortunately that is causing her to have a lot more issue today. She was actually rather tearful. Electronic Signature(s) Signed: 03/25/2023 4:42:19 PM By: Allen Derry PA-C Entered By: Allen Derry on 03/25/2023 16:42:19 Eline, [name] ([id number]) [id number] Page 5 of 11 -------------------------------------------------------------------------------- Physician Orders Details Patient Name: Date of Service: [name], [name] [place] 03/25/2023 3:45 PM Medical Record Number: [id number] Patient Account Number: [id number] Date of Birth/Sex: Treating RN: [birth year] (37 y.o. Freddy Finner Primary Care Provider: [name] Other Clinician: [name] Referring Provider: Treating Provider/Extender: Jaclynn Major in Treatment: 48 The following information was scribed by: [name] The information was scribed for: Allen Derry Verbal / Phone Orders: No Diagnosis Coding ICD-10  Coding Code Description I87.312 Chronic venous hypertension (idiopathic) with ulcer of left lower extremity L97.822 Non-pressure chronic ulcer of other part of left lower leg with fat layer exposed  I89.0 Lymphedema, not elsewhere classified Follow-up Appointments Wound #5 Left,Medial Lower Leg Return Appointment in 1 week. Bathing/ [employer] wounds with antibacterial soap and water. May shower with wound dressing protected with water repellent cover or cast protector. No tub bath. Anesthetic (Use 'Patient Medications' Section for Anesthetic Order Entry) Lidocaine applied to wound bed Medications-Please add to medication list. ntibiotics - Start Levaquin as directed P.O. A Wound Treatment Wound #5 - Lower Leg Wound Laterality: Left, Medial Cleanser: Byram Ancillary Kit - 15 Day Supply (Generic) 3 x Per Week/30 Days Discharge Instructions: Use supplies as instructed; Kit contains: (15) Saline Bullets; (15) 3x3 Gauze; 15 pr Gloves Cleanser: Soap and Water 3 x Per Week/30 Days Discharge Instructions: Gently cleanse wound with antibacterial soap, rinse and pat dry prior to dressing wounds Cleanser: Vashe 5.8 (oz) 3 x Per Week/30 Days Discharge Instructions: Use vashe 5.8 (oz) as directed Peri-Wound Care: AandD Ointment 3 x Per Week/30 Days Discharge Instructions: Apply AandD Ointment as directed around edges to help dressing from sticking Prim Dressing: Hydrofera Blue Ready Transfer Foam, 2.5x2.5 (in/in) (DME) (Dispense As Written) 3 x Per Week/30 Days ary Discharge Instructions: Apply Hydrofera Blue Ready to wound bed as directed cut to fit wound bed Secondary Dressing: (BORDER) Zetuvit Plus SILICONE BORDER Dressing 4x4 (in/in) (DME) (Dispense As Written) 3 x Per Week/30 Days Discharge Instructions: Please do not put silicone bordered dressings under wraps. Use non-bordered dressing only. Laboratory Bacteria identified in Wound by Culture (MICRO) - left medial lower leg LOINC  Code: 6462-6 Convenience Name: Wound culture routine [name], [name] ([id number]) [id number] Page 6 of 11 Patient Medications llergies: tramadol A Notifications Medication Indication Start End 03/25/2023 levofloxacin DOSE 1 - oral 750 mg tablet - 1 tablet oral once daily x 14 days Electronic Signature(s) Signed: 03/25/2023 4:43:36 PM By: Allen Derry PA-C Entered By: Allen Derry on 03/25/2023 16:43:36 -------------------------------------------------------------------------------- Problem List Details Patient Name: Date of Service: [name] Moody MA J. 03/25/2023 3:45 PM Medical Record Number: [id number] Patient Account Number: [id number] Date of Birth/Sex: Treating RN: Oct 09, 1985 (37 y.o. 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/06/2022 No Yes I89.0 Lymphedema, not elsewhere classified 04/18/2022 No Yes Inactive Problems Resolved Problems Electronic Signature(s) Signed: 03/25/2023 4:17:22 PM By: Allen Derry PA-C Entered By: Allen Derry on 03/25/2023 16:17:22 Sundby, [name] ([id number]) [id number] Page 7 of 11 -------------------------------------------------------------------------------- Progress Note Details Patient Name: Date of Service: [name], [name] [place] 03/25/2023 3:45 PM Medical Record Number: [id number] Patient Account Number: [id number] Date of Birth/Sex: Treating RN: 15-Jul-1985 (37 y.o. Freddy Finner Primary Care Provider: [name] Other Clinician: [name] Referring Provider: Treating  Provider/Extender: Jaclynn Major in Treatment: 48 Subjective Chief Complaint Information obtained from Patient 04/18/2022; Left LE Ulcer History of Present Illness (HPI) 37 year old patient who started with having ulcerations on the right lower leg on the lateral part of her ankle for about 2 weeks. She was seen in the ER at [facility] and advised to see the wound care for a consultation. No X-rays of workup was done during the ER visit and no prescription for any medications of compression wraps were given. the patient is not diabetic but does have  hypertension and her medications have been reviewed by me. 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 The right and left greater saphenous vein was found to be tortuous. Deep venous system was also not competent and there was reflux of more than 500 ms. She was then seen by Dr. Karie Schwalbe Early who recommended that the patient would not benefit from endovenous ablation and he had recommended vein stripping odd on the right side and multiple small phlebectomy procedures on the left side. the patient did not follow-up due to social economic reasons. She has not been wearing any compression stockings and has not taken any specific treatment for varicose veins for the last 3 years. 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 We have obtained some appointments to see the vascular surgeons by the end of the month and the patient would like to follow up with me at my [facility] on Wednesday, June 29. 10/14/2014 -- she could not see me yesterday in [place] and hence has come for a review today. She has a vascular workup to be done this afternoon at [facility] She is  doing fine otherwise. 10/22/2014 -- she was seen by Dr. Brantley Fling and he has recommended surgical removal of her right saphenous vein from distal thigh to saphenofemoral odd junction and stab phlebectomy's of multiple large tributary branches throughout her thigh and calf. This would be done under general anesthesia in the outpatient setting. 10/29/2014 -- she is trying to work on a surgical date and in the meanwhile we have got insurance clearance for Apligraf and we will start this next week. 7/22 2016 -- she is here for the first application of Apligraf. 11/19/2014 -- she is here for a second application of Apligraf 11/26/2014 -- she has done fine after her last application of Apligraf and is awaiting her surgery which is scheduled for August 31. 12/03/2014 -- she is doing fine and is here for her third application of Apligraf. 12/21/2014 -- She had surgery on 12/15/2014 by Dr.Early who did #1 ligation and stripping of right great saphenous vein from distal thigh to saphenofemoral junction, #2 stab phlebectomy of large tributary varicose veins in the thigh popliteal space and calf. She had an Ace wrap up to her groin and this was removed today and the Unna's boot was also removed. 12/28/2014 -- she is here for her fourth application of Apligraf. 01/06/2015 - he saw her vascular surgeon Dr. Arbie Cookey who was pleased with her progress and he has confirmed that no surgical procedures could be attempted on the left side. 01/13/2015 -- her wound looks very good and she's been having no problems whatsoever. Readmission: 07/26/2020 upon evaluation today patient presents for initial inspection here in our clinic for a new issue with her left leg although she is previously been seen due to issues with the right leg back in 2016. At that time she was seeing Dr. Arbie Cookey who is a vein/vascular specialist in [place]. He has since semiretired from what I understand. He is working out of [employer] I believe.  Nonetheless she tells me at the time that there was really nothing to do for her left leg although the right leg was where they did most of the work.  Subsequently she states that she is done fairly well until just in the past week where she had issues with bleeding from what appears to be varicose vein on the left leg medially. Unfortunately this has continued to be an issue although she tells me at first it was coming much more significantly Down quite a bit but nonetheless has not completely resolved. Every time she showers she notices that it starts to drain a little bit more. She does have a history of chronic venous insufficiency, lymphedema, varicose veins bilaterally, and obesity. 08/02/2020 upon evaluation today patient appears to be doing about the same in regards to the ulcer on her left leg. She has some eschar covering there is definitely some fluid collecting underneath unfortunately. With that being said she tells me she is still having a tremendous amount of pain therefore she is really not able to allow me to clean this off very effectively to be perfectly honest. I think we need to try to soften this up 08/16/2020 upon evaluation today patient's wound is really not doing significantly better not really states about the same. She notes that the wrap just does not seem to be staying up very well at all unfortunately. No fevers, chills, nausea, vomiting, or diarrhea. She did cut it off once it starts to slide in order to alleviate some of the pressure from sliding Down. Fortunately there is no signs of active infection at this time which is great news. 08/23/2020 upon evaluation today patient appears to be doing well 08/23/2020 upon evaluation today patient appears to be doing well with regard to her wound all things considered. Fortunately there does not appear to be any signs of active infection at this time which is great news. She has been tolerating the dressing [name], [name]  ([id number]) [id number] Page 8 of 11 changes without complication and overall I am extremely pleased with where things stand at this point. She does have her appointment with vascular in [facility] on June 9. 08/30/2020 upon evaluation today patient actually appears to be doing decently well in regard to her wound. Fortunately there is no signs of active infection which is great news. Nonetheless I do believe that the patient is going require little bit of debridement if she is okay with me attempting that today I think that will help clean off some of the necrotic tissue. Fortunately there does not appear to be otherwise any evidence of active infection which is also great news. 09/19/2020 upon evaluation today patient appears to be doing a little better in regard to her wound as compared to previous. Fortunately there does not appear to be any signs of active infection overall. No fever chills noted. I do believe that the Iodosorb has made this a little bit better with regard to the overall size and appearance of the wound bed though again she does still have quite a ways to go to get this to heal she still very tender to touch. 09/27/2020 upon evaluation today patient appears to actually be doing quite well with regard to her wound. This is measuring much smaller which is great news. With that being said she did see vein and vascular in [facility] and they subsequently recommended that surgery is really what she probably needs to go forward with sounds like the potential for venous ablation. With that being said the patient tells me this is just not the right time for her to be able to proceed with any type of surgery which I completely  understand. Nonetheless I do believe that she would continue to benefit from compression but again that is really not something that she is able to easily do. 10/04/2020 upon evaluation today patient appears to be doing about the same in regard  to her wound. This is measuring a little bit smaller but nonetheless still is open and again has some slough and biofilm noted on the surface of the wound. There does not appear to be any signs of active infection which is great news. No fevers, chills, nausea, vomiting, or diarrhea. 10/04/2020 upon evaluation today patient appears to be doing well with regard to her wound. She has been tolerating dressing changes without complication. Fortunately there does not appear to be any signs of active infection which is great news. No fevers, chills, nausea, vomiting, or diarrhea. 10/25/2020 upon evaluation today patient appears to be doing well with regard to her wound. She has been tolerating the dressing changes without complication. Fortunately there is no signs of active infection at this time. No fevers, chills, nausea, vomiting, or diarrhea. 11/01/2020 upon evaluation today patient with regard to her wound. She has been tolerating the dressing changes without complication. Fortunately there does not appear to be any signs of infection which is great news. No fever chills noted 11/15/2020 upon evaluation today patient appears to be doing well with regard to her wound. Fortunately there is no signs of active infection at this time. No fevers, chills, nausea, vomiting, or diarrhea. With that being said she continues to have a significant amount of pain at the site even though this is very close to complete closure. She also had several varicose veins around the area which were also problematic. Overall however I feel like the patient is making excellent progress. 11/28/2020 upon evaluation today patient appears to be doing well with regard to her leg ulcer. Again were not really able to debride or compression wrap her due to discomfort and pain. She does not allow for that. With that being said we have been using Iodosorb which does seem to be doing decently well. Fortunately there is no signs of active  infection at this time which is great news. No fevers, chills, nausea, vomiting, or diarrhea. 8/31; patient presents for 2-week follow-up. She has been using Iodosorb. She reports that the wound is closed. She denies signs of infection. Readmission: 10-27-2021 upon evaluation this is a patient that presents today whom I have previously seen this is pretty much for the same issue though I think a little bit higher than the last time I saw her. She does have a history of chronic venous insufficiency and hypertension along with varicose veins. Subsequently she does have an ulceration which spontaneously ruptured she has not been wearing any compression which I think is a big part of the issue here. We discussed this before I really think she probably needs to be wearing her compression therapy, she probably needs lymphedema pumps if she can ever wear the compression for a significant amount of time to get these, and subsequently also think that she needs to be elevating her legs is much as possible she may even need some vascular intervention in regard to her veins. All of this was reiterated and discussed with her today to reinforce what needs to happen in order to ensure that her legs do not get a lot worse. The patient voiced understanding. She tells me that she knows because she is seeing her mom go through a lot of this as well  how bad things can get. 11-03-2021 upon evaluation today patient appears to be doing well with regard to her wound. Fortunately there does not appear to be any signs of active infection at this time. She is measuring a little bit bigger but I think this is because the wound is actually cleaning up a bit here. 7/27; left lateral leg wound not any smaller but perhaps with a cleaner surface. She is using Iodoflex to help with the latter and using Tubigrip. She has chronic venous insufficiency with secondary lymphedema. She is apparently followed by vein and vascular and is being  scheduled for an ablation 11-17-2021 upon evaluation today patient appears to be doing well with regard to her wound this is actually showing signs of improvement which is great news. Fortunately I do not see any evidence of active infection locally or systemically at this time which is great news. No fevers, chills, nausea, vomiting, or diarrhea. 11-24-2021 upon evaluation today patient's wound is actually showing signs of significant improvement. Unfortunately she had quite a bit of pain with debridement last week I do believe it was beneficial but at the same time she is doing much better but still really does not want this debrided again I think being that it is looking a whole lot better I would try to avoid that today especially since it caused her so much discomfort that is really not the goal and I explained that to the patient today she voiced understanding and knows that it needed to be done but still states that it was quite painful. 11-30-2021 upon evaluation today patient appears to be doing excellent in regard to her wound this is actually showing signs of excellent improvement I am very pleased with where things stand. She does have her venous ablation appointment for October 17. 12-21-2021 upon evaluation today patient appears to be doing better in regard to her wound this is measuring smaller and looking better as well. Fortunately I do not see any signs of active infection locally or systemically which is great news. 01-01-2022 upon evaluation today patient appears to be doing well currently in regard to her wound. She is showing signs of improvement which is great news and overall I do not see any signs of active infection locally or systemically at this time. 01-08-2022 upon evaluation today patient appears to be doing well currently in regard to her wound she is actually showing signs of significant improvement which is great news. Fortunately I do not see any evidence of active infection  locally or systemically at this time. I do believe that we are on the right track. She also has her appointment October 19 for the venous ablation. 01-16-2022 upon evaluation today patient's wound actually is showing some signs of improvement although this is very slow. Fortunately I do not see any evidence of infection at this time. The volume is a little bit more although the size is smaller I think this is due to the fact that we are slowly cleaning this area out effectively. 11/6 continued improvement the patient is using Iodoflex and a Tubigrip E. She was supposed to have venous surgery at vein and vascular in [place] however somehow this is gotten delayed till December 14. 02-27-2022 upon evaluation today patient's wound is actually showing signs of being completely healed. Fortunately I do not see any evidence of active infection locally or systemically which is great news and overall I am extremely pleased with where we stand today. Unfortunately she does tell me  that she postpone her venous ablation surgery until December 14 she tells me that she was not ready "financially" for this. 04/18/2022; Ms. Camdyn Bousquet is a 37 year old female with a past medical history of venous insufficiency that presents the clinic for a left lower extremity wound. She was seen almost 2 months ago for the same wound. This had healed with Iodoflex and Tubigrip. She states that the wound recently reopened. She [name], [name] ([id number]) [id number] Page 9 of 11 has seen vein and vascular and plan is for ligation and stripping of the left great saphenous vein. She is not sure when this procedure is going to be scheduled. She has canceled it once before. She has had office compression wraps in the past however these do not stay on and create more of an issue for her. She would like to avoid this. She currently denies signs of infection. 1/10; patient presents for follow-up. She has  been using Iodoflex to the wound bed. She states she has been using her Tubigrip however she does not have this on today. She has no issues or complaints today. She denies signs of infection. 1/17; patient presents for follow-up. She has not been using Iodoflex to the wound bed. She reports acute pain. She denies increased warmth, erythema or purulent drainage to the left lower extremity. She states she has been using Tubigrip. She has information to order the compression stockings but has not obtained them. 1/24; Patient had a wound culture done at last clinic visit that grew Proteus mirabilis and E. coli. She was prescribed levofloxacin and this should cover both bacteria. She has been taking the medication over the past week with improvement of symptoms to the wound bed. She has been using Hydrofera Blue under Tubigrip. She states the [facility] is sticking to her wound bed. 1/31; patient presents for follow-up. She has been using Medihoney to the wound bed with improvement in healing. She has been contacted by [facility] to order her antibiotic ointment. She is not sure yet if she is able to afford this. She received her compression stockings but states they did not fit well. She is working on returning these. She has been using Tubigrip. 2/7; patient presents for follow-up. She is been using Medihoney to the wound bed. She obtained her compression stockings and has been using these daily. She states that the [facility] antibiotic ointment is arriving in the mail tomorrow. She has no issues or complaints today. She denies systemic signs of infection. 2/14; patient presents for follow-up. She has been using [facility] antibiotic ointment to the wound bed. She states the [facility] is sticking and she has stopped this. She is been using her compression stockings daily. She does not have these on today. 2/28; patient presents for follow-up. She continues to use [facility] antibiotic ointment to the  wound bed. There is been improvement in healing. She has been using her compression stockings daily. 3/12; patient presents for follow-up. She has been using [facility] antibiotic ointment to the wound bed. She is not wearing her compression stockings today but states she does wear them daily. She declines debridement today. 3/20; patient presents for follow-up. She has been using [facility] antibiotic ointment with Hydrofera Blue. She states she is wearing her compression stockings daily but she does not have them on today. 3/27; patient presents for follow-up. She has been using [facility] antibiotic ointment and hydrofera blue to the wound bed. She reports wearing compression stockings. 4/10; patient presents for follow-up. She has  been using [facility] antibiotic ointment and Hydrofera Blue to the wound bed. She reports wearing compression stockings. She has no issues or complaints today. 4/24; patient presents for follow-up. She has been using Keystone antibiotic ointment and Hydrofera Blue to the wound bed. Overall there is healthier tissue today. She reports wearing her compression stockings daily. 5/8; patient comes into clinic today with outer stocking on the left leg and with no Keystone. She has small wounds on the medial left lower leg and has been using Keystone and [employer]	[place] at home. 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 She has chronic venous hypertension and stage III lymphedema. She came in today saying she did not want debridement and really was not interested in compression wraps saying they fall down consistently. She does not have compression pumps 6/20; patient has missed her last 3 follow-up appointments. She has been using Keystone antibiotic ointment and Hydrofera Blue. We discussed potentially doing an in office wrap and patient declined Today. She states she will try the in office wrap next week. She denies signs of infection. 7/3; patient presents for  follow-up. She missed her last clinic appointment. She declines doing the in office wrap today. She has been using Hydrofera Blue with antibiotic ointment. 8/14; patient has not been here since the beginning of July. She has wounds secondary to chronic venous insufficiency and lymphedema on the left medial lower leg. She has supposedly been using [facility]. She is covering this with some form of foam dressing. She is using compression stockings from elastic therapy which are probably 20/30 mmHg although we just do not seem to be able to identify that. She tells me she puts those on during the day and leaves them on all night and only changing them once a day. 9/4; patient presents for follow-up. She has been using Hydrofera Blue to the wound bed. The wound is smaller. She has been wearing her compression stockings daily. 01-14-2023 upon evaluation today patient appears to be doing well currently in regard to her wounds. She is doing very similar to when I last saw her. Fortunately I do not think that there is any signs of significant infection unfortunately I do believe that she is continuing to have some issues here with the open wounds and she should be seeing vascular shortly to look back into the venous ablation side of things. 01-21-2023 upon evaluation today patient appears to be doing well currently in regard to her wound. There are some slough and biofilm noted on the surface of the wound. With that being said she does not have any signs of infection at this point which is good news. No fevers, chills, nausea, vomiting, or diarrhea. 02-14-2023 upon evaluation today patient's wound appears to be doing about the same. She still will not allow for any debridement she states it hurts too bad she does have a venous ablation is being worked up again she was post to have it previous but she did not end up going through with that and then the approval timed on that they are having to  review all the evaluation and approval process at this point. She does have an appointment in 2 weeks with the vascular surgeon to go over the results of her most recent venous study. 03-25-2023 upon evaluation today patient appears to be doing well currently in regard to her leg overall although she is having some increased pain this may be secondary to have  an infection. Fortunately I do not see any signs of active infection systemically but locally it feels like that may be what is going on here based on what I am experiencing from the patient. Objective Constitutional Obese and well-hydrated in no acute distress. [name], [name] ([id number]) [id number] Page 10 of 11 Vitals Time Taken: 4:06 PM, Weight: 400 lbs, Temperature: 98.8 F, Pulse: 107 bpm, Respiratory Rate: 18 breaths/min, Blood Pressure: 127/69 mmHg. Respiratory normal breathing without difficulty. Psychiatric this patient is able to make decisions and demonstrates good insight into disease process. Alert and Oriented x 3. pleasant and cooperative. General Notes: Upon evaluation patient's wound is really not doing much better. She unfortunately is not can to be a candidate until she is able to lose some weight for the ablation that she was looking toward. With that being said she is gena try to get in with a dietitian in the meantime she tells me that right now she is having quick upsurge in pain and unfortunately that is causing her to have a lot more issue today. She was actually rather tearful. Integumentary (Hair, Skin) Wound #5 status is Open. Original cause of wound was Gradually Appeared. The date acquired was: 04/10/2022. The wound has been in treatment 48 weeks. The wound is located on the Left,Medial Lower Leg. The wound measures 0.6cm length x 1.5cm width x 0.3cm depth; 0.707cm^2 area and 0.212cm^3 volume. There is Fat Layer (Subcutaneous Tissue) exposed. There is a medium amount of  serosanguineous drainage noted. There is medium (34-66%) red granulation within the wound bed. There is a medium (34-66%) amount of necrotic tissue within the wound bed including Adherent Slough. Assessment Active Problems ICD-10 Chronic venous hypertension (idiopathic) with ulcer of left lower extremity Non-pressure chronic ulcer of other part of left lower leg with fat layer exposed Lymphedema, not elsewhere classified Plan Follow-up Appointments: Wound #5 Left,Medial Lower Leg: Return Appointment in 1 week. Bathing/ Shower/ Hygiene: Wash wounds with antibacterial soap and water. May shower with wound dressing protected with water repellent cover or cast protector. No tub bath. Anesthetic (Use 'Patient Medications' Section for Anesthetic Order Entry): Lidocaine applied to wound bed Medications-Please add to medication list.: P.O. Antibiotics - Start Levaquin as directed Laboratory ordered were: Wound culture routine - left medial lower leg The following medication(s) was prescribed: levofloxacin oral 750 mg tablet 1 1 tablet oral once daily x 14 days starting 03/25/2023 WOUND #5: - Lower Leg Wound Laterality: Left, Medial Cleanser: Byram Ancillary Kit - 15 Day Supply (Generic) 3 x Per Week/30 Days Discharge Instructions: Use supplies as instructed; Kit contains: (15) Saline Bullets; (15) 3x3 Gauze; 15 pr Gloves Cleanser: Soap and Water 3 x Per Week/30 Days Discharge Instructions: Gently cleanse wound with antibacterial soap, rinse and pat dry prior to dressing wounds Cleanser: Vashe 5.8 (oz) 3 x Per Week/30 Days Discharge Instructions: Use vashe 5.8 (oz) as directed Peri-Wound Care: AandD Ointment 3 x Per Week/30 Days Discharge Instructions: Apply AandD Ointment as directed around edges to help dressing from sticking Prim Dressing: Hydrofera Blue Ready Transfer Foam, 2.5x2.5 (in/in) (DME) (Dispense As Written) 3 x Per Week/30 Days ary Discharge Instructions: Apply Hydrofera Blue  Ready to wound bed as directed cut to fit wound bed Secondary Dressing: (BORDER) Zetuvit Plus SILICONE BORDER Dressing 4x4 (in/in) (DME) (Dispense As Written) 3 x Per Week/30 Days Discharge Instructions: Please do not put silicone bordered dressings under wraps. Use non-bordered dressing only. 1. I would recommend based on what we are seeing that we  going to send in a prescription for her. She has done well in the past with Levaquin which I think is gena be a good option here as well and we will going get that sent into the pharmacy for her. 2. I am going to also go ahead and obtain a wound culture I did go ahead and do that today as well and she tolerated the collection of the culture without complication. There was some discomfort but fortunately nothing too significant. 3. I am going to recommend the patient should continue to elevate her legs she should also be wearing her compression stockings on a regular basis this is of utmost importance. We will see patient back for reevaluation in 1 week here in the clinic. If anything worsens or changes patient will contact our office for additional recommendations. Electronic Signature(s) Signed: 03/25/2023 4:44:12 PM By: Allen Derry PA-C Entered By: Allen Derry on 03/25/2023 16:44:12 Lefever, [name] ([id number]) [id number] Page 11 of 11 -------------------------------------------------------------------------------- SuperBill Details Patient Name: Date of Service: KENNETHA, LUDWIG [place] 03/25/2023 Medical Record Number: [id number] Patient Account Number: [id number] Date of Birth/Sex: Treating RN: 03-08-1986 (37 y.o. Freddy Finner Primary Care Provider: [name] Other Clinician: [name] Referring Provider: Treating Provider/Extender: Jaclynn Major in Treatment: 48 Diagnosis Coding ICD-10 Codes Code Description [phone] Chronic venous hypertension (idiopathic) with ulcer of left lower  extremity L97.822 Non-pressure chronic ulcer of other part of left lower leg with fat layer exposed I89.0 Lymphedema, not elsewhere classified Facility Procedures : CPT4 Code: [id number] Description: 99213 - WOUND CARE VISIT-LEV 3 EST PT Modifier: Quantity: 1 Physician Procedures : CPT4 Code Description Modifier [id number] - WC PHYS LEVEL 4 - EST PT ICD-10 Diagnosis Description I87.312 Chronic venous hypertension (idiopathic) with ulcer of left lower extremity L97.822 Non-pressure chronic ulcer of other part of left lower leg  with fat layer exposed I89.0 Lymphedema, not elsewhere classified Quantity: 1 Electronic Signature(s) Signed: 03/25/2023 4:44:49 PM By: Allen Derry PA-C Previous Signature: 03/25/2023 4:44:30 PM Version By: Allen Derry PA-C Entered By: Allen Derry on 03/25/2023 16:44:49

## 2023-03-26 NOTE — Progress Notes (Signed)
[name] ([id number]) [id number] Page 1 of 9 Visit Report for 03/25/2023 Arrival Information Details Patient Name: Date of Service: [name], [name] [place]. 03/25/2023 3:45 PM Medical Record Number: [id number] Patient Account Number: [id number] Date of Birth/Sex: Treating RN: December 05, 1985 (37 y.o. Freddy Finner Primary Care Linley Moxley: [name] Other Clinician: [name] Referring Juventino Pavone: Treating Abigail Marsiglia/Extender: Jaclynn Major in Treatment: 48 Visit Information History Since Last Visit All ordered tests and consults were completed: No Patient Arrived: Ambulatory Added or deleted any medications: No Arrival Time: 16:00 Any new allergies or adverse reactions: No Transfer Assistance: None Had a fall or experienced change in No Patient Identification Verified: Yes activities of daily living that may affect Secondary Verification Process Completed: Yes risk of falls: Patient Requires Transmission-Based Precautions: No Signs or symptoms of abuse/neglect since last visito No Patient Has Alerts: No Hospitalized since last visit: No Implantable device outside of the clinic excluding No cellular tissue based products placed in the center since last visit: Has Dressing in Place as Prescribed: Yes Pain Present Now: Yes Electronic Signature(s) Signed: 03/25/2023 4:48:25 PM By: [name] Entered By: [name] on 03/25/2023 13:06:04 -------------------------------------------------------------------------------- Clinic Level of Care Assessment Details Patient Name: Date of Service: [name] [place] 03/25/2023 3:45 PM Medical Record Number: [id number] Patient Account Number: [id number] Date of Birth/Sex: Treating RN: 01-20-1986 (37 y.o. Freddy Finner Primary Care Ilana Prezioso: [name] Other Clinician: [name] Referring Royelle Hinchman: Treating Brocha Gilliam/Extender: Jaclynn Major in  Treatment: 48 Clinic Level of Care Assessment Items TOOL 4 Quantity Score []  - 0 Use when only an EandM is performed on FOLLOW-UP visit ASSESSMENTS - Nursing Assessment / Reassessment X- 1 10 Reassessment of Co-morbidities (includes updates in patient status) X- 1 5 Reassessment of Adherence to Treatment Plan [name], [name] ([id number]) [id number] Page 2 of 9 ASSESSMENTS - Wound and Skin A ssessment / Reassessment X - Simple Wound Assessment / Reassessment - one wound 1 5 []  - 0 Complex Wound Assessment / Reassessment - multiple wounds []  - 0 Dermatologic / Skin Assessment (not related to wound area) ASSESSMENTS - Focused Assessment []  - 0 Circumferential Edema Measurements - multi extremities []  - 0 Nutritional Assessment / Counseling / Intervention []  - 0 Lower Extremity Assessment (monofilament, tuning fork, pulses) []  - 0 Peripheral Arterial Disease Assessment (using hand held doppler) ASSESSMENTS - Ostomy and/or Continence Assessment and Care []  - 0 Incontinence Assessment and Management []  - 0 Ostomy Care Assessment and Management (repouching, etc.) PROCESS - Coordination of Care X - Simple Patient / Family Education for ongoing care 1 15 []  - 0 Complex (extensive) Patient / Family Education for ongoing care []  - 0 Staff obtains Chiropractor, Records, T Results / Process Orders est []  - 0 Staff telephones HHA, Nursing Homes / Clarify orders / etc []  - 0 Routine Transfer to another Facility (non-emergent condition) []  - 0 Routine Hospital Admission (non-emergent condition) []  - 0 New Admissions / Manufacturing engineer / Ordering NPWT Apligraf, etc. , []  - 0 Emergency Hospital Admission (emergent condition) X- 1 10 Simple Discharge Coordination []  - 0 Complex (extensive) Discharge Coordination PROCESS - Special Needs []  - 0 Pediatric / Minor Patient Management []  - 0 Isolation Patient Management []  - 0 Hearing / Language /  Visual special needs []  - 0 Assessment of Community assistance (transportation, D/C planning, etc.) []  - 0 Additional assistance / Altered mentation []  - 0 Support Surface(s) Assessment (bed, cushion, seat, etc.) INTERVENTIONS - Wound Cleansing /  Measurement X - Simple Wound Cleansing - one wound 1 5 []  - 0 Complex Wound Cleansing - multiple wounds X- 1 5 Wound Imaging (photographs - any number of wounds) []  - 0 Wound Tracing (instead of photographs) X- 1 5 Simple Wound Measurement - one wound []  - 0 Complex Wound Measurement - multiple wounds INTERVENTIONS - Wound Dressings []  - 0 Small Wound Dressing one or multiple wounds X- 1 15 Medium Wound Dressing one or multiple wounds []  - 0 Large Wound Dressing one or multiple wounds []  - 0 Application of Medications - topical []  - 0 Application of Medications - injection INTERVENTIONS - Miscellaneous []  - 0 External ear exam [name], [name] ([id number]) [id number] Page 3 of 9 X- 1 5 Specimen Collection (cultures, biopsies, blood, body fluids, etc.) []  - 0 Specimen(s) / Culture(s) sent or taken to Lab for analysis []  - 0 Patient Transfer (multiple staff / Michiel Sites Lift / Similar devices) []  - 0 Simple Staple / Suture removal (25 or less) []  - 0 Complex Staple / Suture removal (26 or more) []  - 0 Hypo / Hyperglycemic Management (close monitor of Blood Glucose) []  - 0 Ankle / Brachial Index (ABI) - do not check if billed separately X- 1 5 Vital Signs Has the patient been seen at the hospital within the last three years: Yes Total Score: 85 Level Of Care: New/Established - Level 3 Electronic Signature(s) Signed: 03/25/2023 4:48:25 PM By: [name] Entered By: [name] on 03/25/2023 13:27:19 -------------------------------------------------------------------------------- Encounter Discharge Information Details Patient Name: Date of Service: [name]. 03/25/2023 3:45 PM Medical  Record Number: [id number] Patient Account Number: [id number] Date of Birth/Sex: Treating RN: May 01, 1985 (37 y.o. Freddy Finner Primary Care Geordan Xu: [name] Other Clinician: [name] Referring Ladawna Walgren: Treating Annalissa Murphey/Extender: Jaclynn Major in Treatment: 48 Encounter Discharge Information Items Discharge Condition: Stable Ambulatory Status: Ambulatory Discharge Destination: Home Transportation: Private Auto Accompanied By: self Schedule Follow-up Appointment: Yes Clinical Summary of Care: Electronic Signature(s) Signed: 03/25/2023 4:48:25 PM By: [name] Entered By: [name] on 03/25/2023 13:44:27 Lower Extremity Assessment Details -------------------------------------------------------------------------------- [name] ([id number]) [id number] Page 4 of 9 Patient Name: Date of Service: [name], [name] [place] 03/25/2023 3:45 PM Medical Record Number: [id number] Patient Account Number: [id number] Date of Birth/Sex: Treating RN: March 30, 1986 (37 y.o. Freddy Finner Primary Care Albertus Chiarelli: [name] Other Clinician: [name] Referring Leiland Mihelich: Treating Caswell Alvillar/Extender: Jaclynn Major in Treatment: 48 Edema Assessment Left: Right: Assessed: Yes No Edema: Yes Calf Left: Right: Point of Measurement: 32 cm From Medial Instep 63 cm Ankle Left: Right: Point of Measurement: 12 cm From Medial Instep 34 cm Vascular Assessment Left: Right: Pulses: Dorsalis Pedis Palpable: Yes Extremity colors, hair growth, and conditions: Extremity Color: Normal Hair Growth on Extremity: Yes Temperature of Extremity: Warm Capillary Refill: > 3 seconds Toe Nail Assessment Left: Right: Thick: No Discolored: No Deformed: No Improper Length and Hygiene: No Electronic Signature(s) Signed: 03/25/2023 4:48:25 PM By: [name] Signed: 03/26/2023 3:25:36 PM By: Yevonne Pax  RN Entered By: [name] on 03/25/2023 13:15:17 -------------------------------------------------------------------------------- Multi Wound Chart Details Patient Name: Date of Service: [name]. 03/25/2023 3:45 PM Medical Record Number: [id number] Patient Account Number: [id number] Date of Birth/Sex: Treating RN: [birth year] (37 y.o. Freddy Finner Primary Care Saim Almanza: [name] Other Clinician: [name] Referring Brewer Hitchman: Treating Letha Mirabal/Extender: Jaclynn Major in Treatment: 48 Vital Signs Height(in): Pulse(bpm): 107 Weight(lbs): 400 Blood Pressure(mmHg): 127/69 Body Mass  Index(BMI): Temperature(F): 98.8 Respiratory Rate(breaths/min): [address] [name] ([id number]) [id number] Page 5 of 9 [5:Photos:] [N/A:N/A] Left, Medial Lower Leg N/A N/A Wound Location: Gradually Appeared N/A N/A Wounding Event: Venous Leg Ulcer N/A N/A Primary Etiology: Hypertension, Peripheral Venous N/A N/A Comorbid History: Disease 04/10/2022 N/A N/A Date Acquired: 24 N/A N/A Weeks of Treatment: Open N/A N/A Wound Status: No N/A N/A Wound Recurrence: 0.6x1.5x0.3 N/A N/A Measurements L x W x D (cm) 0.707 N/A N/A A (cm) : rea 0.212 N/A N/A Volume (cm) : 87.10% N/A N/A % Reduction in Area: 80.70% N/A N/A % Reduction in Volume: Full Thickness Without Exposed N/A N/A Classification: Support Structures Medium N/A N/A Exudate Amount: Serosanguineous N/A N/A Exudate Type: red, brown N/A N/A Exudate Color: Medium (34-66%) N/A N/A Granulation Amount: Red N/A N/A Granulation Quality: Medium (34-66%) N/A N/A Necrotic Amount: Fat Layer (Subcutaneous Tissue): Yes N/A N/A Exposed Structures: Fascia: No Tendon: No Muscle: No Joint: No Bone: No Small (1-33%) N/A N/A Epithelialization: Treatment Notes Electronic Signature(s) Signed: 03/25/2023 4:48:25 PM By: [name] Entered By: [name] on  03/25/2023 13:15:29 -------------------------------------------------------------------------------- Multi-Disciplinary Care Plan Details Patient Name: Date of Service: [name]. 03/25/2023 3:45 PM Medical Record Number: [id number] Patient Account Number: [id number] Date of Birth/Sex: Treating RN: [birth year] (37 y.o. Freddy Finner Primary Care Antawan Mchugh: [name] Other Clinician: [name] Referring Jayce Kainz: Treating Andjela Wickes/Extender: Jaclynn Major in Treatment: 48 Active Inactive Wound/Skin Impairment Nursing Diagnoses: [name], [name] ([id number]) [id number] Page 6 of 9 Knowledge deficit related to ulceration/compromised skin integrity Goals: Patient/caregiver will verbalize understanding of skin care regimen Date Initiated: 04/18/2022 Target Resolution Date: 07/18/2022 Goal Status: Active Ulcer/skin breakdown will have a volume reduction of 30% by week 4 Date Initiated: 04/18/2022 Date Inactivated: 07/04/2022 Target Resolution Date: 05/19/2022 Goal Status: Unmet Unmet Reason: comorbidities Ulcer/skin breakdown will have a volume reduction of 50% by week 8 Date Initiated: 04/18/2022 Date Inactivated: 07/04/2022 Target Resolution Date: 06/18/2022 Goal Status: Unmet Unmet Reason: comorbidities Ulcer/skin breakdown will have a volume reduction of 80% by week 12 Date Initiated: 04/18/2022 Target Resolution Date: 07/18/2022 Goal Status: Active Ulcer/skin breakdown will heal within 14 weeks Date Initiated: 04/18/2022 Target Resolution Date: 08/17/2022 Goal Status: Active Interventions: Assess patient/caregiver ability to obtain necessary supplies Assess patient/caregiver ability to perform ulcer/skin care regimen upon admission and as needed Assess ulceration(s) every visit Notes: Electronic Signature(s) Signed: 03/25/2023 4:48:25 PM By: [name] Signed: 03/26/2023 3:25:36 PM By: Yevonne Pax RN Entered By: [name] on 03/25/2023 13:28:56 -------------------------------------------------------------------------------- Pain Assessment Details Patient Name: Date of Service: [name]. 03/25/2023 3:45 PM Medical Record Number: [id number] Patient Account Number: [id number] Date of Birth/Sex: Treating RN: 1985/06/04 (37 y.o. Freddy Finner Primary Care Kojo Liby: [name] Other Clinician: [name] Referring Layton Naves: Treating Laverta Harnisch/Extender: Jaclynn Major in Treatment: 48 Active Problems Location of Pain Severity and Description of Pain Patient Has Paino Yes Site Locations Pain Location: Pain in Ulcers Duration of the Pain. Constant / Intermittento Constant Rate the pain. Current Pain Level: [address] [name] ([id number]) [id number] Page 7 of 9 Character of Pain Describe the Pain: Burning, Other: tingling Pain Management and Medication Current Pain Management: Medication: Yes Cold Application: No Rest: No Massage: No Activity: No T.E.N.S.: No Heat Application: No Leg drop or elevation: No Is the Current Pain Management Adequate: Inadequate How does your wound impact your activities of daily livingo Sleep: No Bathing: No Appetite: No Relationship With Others: No Bladder Continence:  No Emotions: No Bowel Continence: No Work: No Toileting: No Drive: No Dressing: No Hobbies: No Electronic Signature(s) Signed: 03/25/2023 4:48:25 PM By: [name] Signed: 03/26/2023 3:25:36 PM By: Yevonne Pax RN Entered By: [name] on 03/25/2023 13:09:37 -------------------------------------------------------------------------------- Patient/Caregiver Education Details Patient Name: Date of Service: [name]. 12/9/2024andnbsp3:45 PM Medical Record Number: [id number] Patient Account Number: [id number] Date of Birth/Gender: Treating RN: 06/08/1985 (37 y.o. Freddy Finner Primary Care Physician: [name] Other Clinician: [name] Referring Physician: Treating Physician/Extender: Jaclynn Major in Treatment: 48 Education Assessment Education Provided To: Patient Education Topics Provided Infection: Handouts: Other: start antibiotic as directed Methods: Explain/Verbal Responses: State content correctly Electronic Signature(s) Signed: 03/25/2023 4:48:25 PM By: [name] Entered By: [name] on 03/25/2023 13:42:48 [name], [name] ([id number]) [id number] Page 8 of 9 -------------------------------------------------------------------------------- Wound Assessment Details Patient Name: Date of Service: [name], [name] [place] 03/25/2023 3:45 PM Medical Record Number: [id number] Patient Account Number: [id number] Date of Birth/Sex: Treating RN: 19-Sep-1985 (37 y.o. Freddy Finner Primary Care Ellsie Violette: [name] Other Clinician: [name] Referring Janson Lamar: Treating Felix Pratt/Extender: Jaclynn Major in Treatment: 48 Wound Status Wound Number: 5 Primary Etiology: Venous Leg Ulcer Wound Location: Left, Medial Lower Leg Wound Status: Open Wounding Event: Gradually Appeared Comorbid History: Hypertension, Peripheral Venous Disease Date Acquired: 04/10/2022 Weeks Of Treatment: 48 Clustered Wound: No Photos Wound Measurements Length: (cm) 0.6 Width: (cm) 1.5 Depth: (cm) 0.3 Area: (cm) 0.707 Volume: (cm) 0.212 % Reduction in Area: 87.1% % Reduction in Volume: 80.7% Epithelialization: Small (1-33%) Wound Description Classification: Full Thickness Without Exposed Support Exudate Amount: Medium Exudate Type: Serosanguineous Exudate Color: red, brown Structures Foul Odor After Cleansing: No Slough/Fibrino Yes Wound Bed Granulation Amount: Medium (34-66%) Exposed Structure Granulation Quality: Red Fascia Exposed: No Necrotic Amount: Medium (34-66%) Fat Layer (Subcutaneous  Tissue) Exposed: Yes Necrotic Quality: Adherent Slough Tendon Exposed: No Muscle Exposed: No Joint Exposed: No Bone Exposed: No Treatment Notes Wound #5 (Lower Leg) Wound Laterality: Left, Medial Cleanser Peri-Wound Care Topical [name], [name] ([id number]) [id number] Page 9 of 9 Primary Dressing Secondary Dressing Secured With Compression Wrap Compression Stockings Add-Ons Electronic Signature(s) Signed: 03/25/2023 4:48:25 PM By: [name] Signed: 03/26/2023 3:25:36 PM By: Yevonne Pax RN Entered By: [name] on 03/25/2023 13:14:02 -------------------------------------------------------------------------------- Vitals Details Patient Name: Date of Service: [name]. 03/25/2023 3:45 PM Medical Record Number: [id number] Patient Account Number: [id number] Date of Birth/Sex: Treating RN: [birth year] (37 y.o. Freddy Finner Primary Care Catalia Massett: [name] Other Clinician: [name] Referring Kelsey Edman: Treating Keiandre Cygan/Extender: Jaclynn Major in Treatment: 48 Vital Signs Time Taken: 16:06 Temperature (F): 98.8 Weight (lbs): 400 Pulse (bpm): 107 Respiratory Rate (breaths/min): 18 Blood Pressure (mmHg): 127/69 Reference Range: 80 - 120 mg / dl Electronic Signature(s) Signed: 03/25/2023 4:48:25 PM By: [name] Entered By: [name] on 03/25/2023 13:09:32

## 2023-04-08 ENCOUNTER — Encounter: Payer: BC Managed Care – PPO | Admitting: Physician Assistant

## 2023-04-08 DIAGNOSIS — I87312 Chronic venous hypertension (idiopathic) with ulcer of left lower extremity: Secondary | ICD-10-CM | POA: Diagnosis not present

## 2023-04-09 NOTE — Progress Notes (Signed)
NIEMAH, DEARMAN ([id number]) [id number] Page 1 of 12 Visit Report for 04/08/2023 Chief Complaint Document Details Patient Name: Date of Service: [name], [name] [place] 04/08/2023 3:45 PM Medical Record Number: [id number] Patient Account Number: [id number] Date of Birth/Sex: Treating RN: Feb 01, 1986 (37 y.o. Freddy Finner Primary Care Provider: [name] Other Clinician: [name] Referring Provider: Treating Provider/Extender: Jaclynn Major in Treatment: 50 Information Obtained from: Patient Chief Complaint 04/18/2022; Left LE Ulcer Electronic Signature(s) Signed: 04/08/2023 4:17:37 PM By: Allen Derry PA-C Entered By: Allen Derry on 04/08/2023 16:17:37 -------------------------------------------------------------------------------- Debridement Details Patient Name: Date of Service: [name] Moody MA J. 04/08/2023 3:45 PM Medical Record Number: [id number] Patient Account Number: [id number] Date of Birth/Sex: Treating RN: Jan 14, 1986 (37 y.o. Freddy Finner Primary Care Provider: [name] Other Clinician: [name] Referring Provider: Treating Provider/Extender: Jaclynn Major in Treatment: 50 Debridement Performed for Assessment: Wound #5 Left,Medial Lower Leg Performed By: Physician Allen Derry, PA-C The following information was scribed by: [name] The information was scribed for: Allen Derry Debridement Type: Debridement Severity of Tissue Pre Debridement: Fat layer exposed Level of Consciousness (Pre-procedure): Awake and Alert Pre-procedure Verification/Time Out Yes - 16:26 Taken: Start Time: 16:26 Percent of Wound Bed Debrided: 100% T Area Debrided (cm): otal 0.24 Tissue and other material debrided: Viable, Non-Viable, Slough, Subcutaneous, Biofilm, Slough Level: Skin/Subcutaneous Tissue Debridement Description: Excisional Instrument: Curette Bleeding: Minimum Hemostasis  Achieved: Pressure DEWANA, DEFLORIO ([id number]) [id number] Page 2 of 12 Response to Treatment: Procedure was tolerated well Level of Consciousness (Post- Awake and Alert procedure): Post Debridement Measurements of Total Wound Length: (cm) 0.3 Width: (cm) 1 Depth: (cm) 0.2 Volume: (cm) 0.047 Character of Wound/Ulcer Post Debridement: Stable Severity of Tissue Post Debridement: Fat layer exposed Post Procedure Diagnosis Same as Pre-procedure Electronic Signature(s) Signed: 04/08/2023 5:11:14 PM By: Allen Derry PA-C Entered By: [name] on 04/08/2023 16:27:37 -------------------------------------------------------------------------------- HPI Details Patient Name: Date of Service: [name] Moody MA J. 04/08/2023 3:45 PM Medical Record Number: [id number] Patient Account Number: [id number] Date of Birth/Sex: Treating RN: [birth year] (38 y.o. Freddy Finner Primary Care Provider: [name] Other Clinician: [name] Referring Provider: Treating Provider/Extender: Jaclynn Major in Treatment: 50 History of Present Illness HPI Description: 37 year old patient who started with having ulcerations on the right lower leg on the lateral part of her ankle for about 2 weeks. She was seen in the ER at [facility] and advised to see the wound care for a consultation. No X-rays of workup was done during the ER visit and no prescription for any medications of compression wraps were given. the patient is not diabetic but does have hypertension and her medications have been reviewed by me. 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 The right and left greater saphenous vein was found to be tortuous. Deep venous system was also not competent and there was reflux of more than  500 ms. She was then seen by Dr. Karie Schwalbe Early who recommended that the patient would not benefit from endovenous ablation and he had recommended vein stripping odd on the right side and multiple small phlebectomy procedures on the left side. the patient did not follow-up due to social economic reasons. She has not been wearing any compression stockings and has not taken any specific treatment for varicose veins for  the last 3 years. 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 We have obtained some appointments to see the vascular surgeons by the end of the month and the patient would like to follow up with me at my [facility] on Wednesday, June 29. 10/14/2014 -- she could not see me yesterday in [place] and hence has come for a review today. She has a vascular workup to be done this afternoon at [facility]. She is doing fine otherwise. 10/22/2014 -- she was seen by Dr. Brantley Fling and he has recommended surgical removal of her right saphenous vein from distal thigh to saphenofemoral odd junction and stab phlebectomy's of multiple large tributary branches throughout her thigh and calf. This would be done under general anesthesia in the outpatient setting. 10/29/2014 -- she is trying to work on a surgical date and in the meanwhile we have got insurance clearance for Apligraf and we will start this next week. 7/22 2016 -- she is here for the first application of Apligraf. 11/19/2014 -- she is here for a second application of Apligraf 11/26/2014 -- she has done fine after her last application of Apligraf and is awaiting her surgery which is scheduled for August 31. 12/03/2014 -- she is doing fine and is here for her third application of Apligraf. 12/21/2014 -- She had surgery on 12/15/2014 by Dr.Early who did #1 ligation and stripping of right great saphenous vein from distal thigh to  saphenofemoral junction, #2 stab phlebectomy of large tributary varicose veins in the thigh popliteal space and calf. She had an Ace wrap up to her groin and this was removed today and the Unna's boot was also removed. 12/28/2014 -- she is here for her fourth application of Apligraf. 01/06/2015 - he saw her vascular surgeon Dr. Arbie Cookey who was pleased with her progress and he has confirmed that no surgical procedures could be attempted on the left side. RASHIA, SHEFF ([id number]) [id number] Page 3 of 12 01/13/2015 -- her wound looks very good and she's been having no problems whatsoever. Readmission: 07/26/2020 upon evaluation today patient presents for initial inspection here in our clinic for a new issue with her left leg although she is previously been seen due to issues with the right leg back in 2016. At that time she was seeing Dr. Arbie Cookey who is a vein/vascular specialist in [place]. He has since semiretired from what I understand. He is working out of [employer] I believe. Nonetheless she tells me at the time that there was really nothing to do for her left leg although the right leg was where they did most of the work. Subsequently she states that she is done fairly well until just in the past week where she had issues with bleeding from what appears to be varicose vein on the left leg medially. Unfortunately this has continued to be an issue although she tells me at first it was coming much more significantly Down quite a bit but nonetheless has not completely resolved. Every time she showers she notices that it starts to drain a little bit more. She does have a history of chronic venous insufficiency, lymphedema, varicose veins bilaterally, and obesity. 08/02/2020 upon evaluation today patient appears to be doing about the same in regards to the ulcer on her left leg. She has some eschar covering there is definitely some fluid collecting underneath  unfortunately. With that being said she tells me  she is still having a tremendous amount of pain therefore she is really not able to allow me to clean this off very effectively to be perfectly honest. I think we need to try to soften this up 08/16/2020 upon evaluation today patient's wound is really not doing significantly better not really states about the same. She notes that the wrap just does not seem to be staying up very well at all unfortunately. No fevers, chills, nausea, vomiting, or diarrhea. She did cut it off once it starts to slide in order to alleviate some of the pressure from sliding Down. Fortunately there is no signs of active infection at this time which is great news. 08/23/2020 upon evaluation today patient appears to be doing well 08/23/2020 upon evaluation today patient appears to be doing well with regard to her wound all things considered. Fortunately there does not appear to be any signs of active infection at this time which is great news. She has been tolerating the dressing changes without complication and overall I am extremely pleased with where things stand at this point. She does have her appointment with vascular in [facility] on June 9. 08/30/2020 upon evaluation today patient actually appears to be doing decently well in regard to her wound. Fortunately there is no signs of active infection which is great news. Nonetheless I do believe that the patient is going require little bit of debridement if she is okay with me attempting that today I think that will help clean off some of the necrotic tissue. Fortunately there does not appear to be otherwise any evidence of active infection which is also great news. 09/19/2020 upon evaluation today patient appears to be doing a little better in regard to her wound as compared to previous. Fortunately there does not appear to be any signs of active infection overall. No fever chills noted. I do believe that the Iodosorb has made this a  little bit better with regard to the overall size and appearance of the wound bed though again she does still have quite a ways to go to get this to heal she still very tender to touch. 09/27/2020 upon evaluation today patient appears to actually be doing quite well with regard to her wound. This is measuring much smaller which is great news. With that being said she did see vein and vascular in [facility] and they subsequently recommended that surgery is really what she probably needs to go forward with sounds like the potential for venous ablation. With that being said the patient tells me this is just not the right time for her to be able to proceed with any type of surgery which I completely understand. Nonetheless I do believe that she would continue to benefit from compression but again that is really not something that she is able to easily do. 10/04/2020 upon evaluation today patient appears to be doing about the same in regard to her wound. This is measuring a little bit smaller but nonetheless still is open and again has some slough and biofilm noted on the surface of the wound. There does not appear to be any signs of active infection which is great news. No fevers, chills, nausea, vomiting, or diarrhea. 10/04/2020 upon evaluation today patient appears to be doing well with regard to her wound. She has been tolerating dressing changes without complication. Fortunately there does not appear to be any signs of active infection which is great news. No fevers, chills, nausea, vomiting, or diarrhea. 10/25/2020 upon evaluation today patient  appears to be doing well with regard to her wound. She has been tolerating the dressing changes without complication. Fortunately there is no signs of active infection at this time. No fevers, chills, nausea, vomiting, or diarrhea. 11/01/2020 upon evaluation today patient with regard to her wound. She has been tolerating the dressing changes without complication.  Fortunately there does not appear to be any signs of infection which is great news. No fever chills noted 11/15/2020 upon evaluation today patient appears to be doing well with regard to her wound. Fortunately there is no signs of active infection at this time. No fevers, chills, nausea, vomiting, or diarrhea. With that being said she continues to have a significant amount of pain at the site even though this is very close to complete closure. She also had several varicose veins around the area which were also problematic. Overall however I feel like the patient is making excellent progress. 11/28/2020 upon evaluation today patient appears to be doing well with regard to her leg ulcer. Again were not really able to debride or compression wrap her due to discomfort and pain. She does not allow for that. With that being said we have been using Iodosorb which does seem to be doing decently well. Fortunately there is no signs of active infection at this time which is great news. No fevers, chills, nausea, vomiting, or diarrhea. 8/31; patient presents for 2-week follow-up. She has been using Iodosorb. She reports that the wound is closed. She denies signs of infection. Readmission: 10-27-2021 upon evaluation this is a patient that presents today whom I have previously seen this is pretty much for the same issue though I think a little bit higher than the last time I saw her. She does have a history of chronic venous insufficiency and hypertension along with varicose veins. Subsequently she does have an ulceration which spontaneously ruptured she has not been wearing any compression which I think is a big part of the issue here. We discussed this before I really think she probably needs to be wearing her compression therapy, she probably needs lymphedema pumps if she can ever wear the compression for a significant amount of time to get these, and subsequently also think that she needs to be elevating her legs  is much as possible she may even need some vascular intervention in regard to her veins. All of this was reiterated and discussed with her today to reinforce what needs to happen in order to ensure that her legs do not get a lot worse. The patient voiced understanding. She tells me that she knows because she is seeing her mom go through a lot of this as well how bad things can get. 11-03-2021 upon evaluation today patient appears to be doing well with regard to her wound. Fortunately there does not appear to be any signs of active infection at this time. She is measuring a little bit bigger but I think this is because the wound is actually cleaning up a bit here. 7/27; left lateral leg wound not any smaller but perhaps with a cleaner surface. She is using Iodoflex to help with the latter and using Tubigrip. She has chronic venous insufficiency with secondary lymphedema. She is apparently followed by vein and vascular and is being scheduled for an ablation 11-17-2021 upon evaluation today patient appears to be doing well with regard to her wound this is actually showing signs of improvement which is great news. Fortunately I do not see any evidence of active infection locally  or systemically at this time which is great news. No fevers, chills, nausea, vomiting, or diarrhea. 11-24-2021 upon evaluation today patient's wound is actually showing signs of significant improvement. Unfortunately she had quite a bit of pain with debridement last week I do believe it was beneficial but at the same time she is doing much better but still really does not want this debrided again I think being that it is looking a whole lot better I would try to avoid that today especially since it caused her so much discomfort that is really not the goal and I explained that to the patient today she voiced understanding and knows that it needed to be done but still states that it was quite painful. 11-30-2021 upon evaluation today  patient appears to be doing excellent in regard to her wound this is actually showing signs of excellent improvement I am very pleased with where things stand. She does have her venous ablation appointment for October 17. HALLEY, OLIVAS ([id number]) [id number] Page 4 of 12 12-21-2021 upon evaluation today patient appears to be doing better in regard to her wound this is measuring smaller and looking better as well. Fortunately I do not see any signs of active infection locally or systemically which is great news. 01-01-2022 upon evaluation today patient appears to be doing well currently in regard to her wound. She is showing signs of improvement which is great news and overall I do not see any signs of active infection locally or systemically at this time. 01-08-2022 upon evaluation today patient appears to be doing well currently in regard to her wound she is actually showing signs of significant improvement which is great news. Fortunately I do not see any evidence of active infection locally or systemically at this time. I do believe that we are on the right track. She also has her appointment October 19 for the venous ablation. 01-16-2022 upon evaluation today patient's wound actually is showing some signs of improvement although this is very slow. Fortunately I do not see any evidence of infection at this time. The volume is a little bit more although the size is smaller I think this is due to the fact that we are slowly cleaning this area out effectively. 11/6 continued improvement the patient is using Iodoflex and a Tubigrip E. She was supposed to have venous surgery at vein and vascular in [place] however somehow this is gotten delayed till December 14. 02-27-2022 upon evaluation today patient's wound is actually showing signs of being completely healed. Fortunately I do not see any evidence of active infection locally or systemically which is great news and  overall I am extremely pleased with where we stand today. Unfortunately she does tell me that she postpone her venous ablation surgery until December 14 she tells me that she was not ready "financially" for this. 04/18/2022; Ms. Shakoya Filter is a 37 year old female with a past medical history of venous insufficiency that presents the clinic for a left lower extremity wound. She was seen almost 2 months ago for the same wound. This had healed with Iodoflex and Tubigrip. She states that the wound recently reopened. She has seen vein and vascular and plan is for ligation and stripping of the left great saphenous vein. She is not sure when this procedure is going to be scheduled. She has canceled it once before. She has had office compression wraps in the past however these do not stay on and create more of an issue for her. She would  like to avoid this. She currently denies signs of infection. 1/10; patient presents for follow-up. She has been using Iodoflex to the wound bed. She states she has been using her Tubigrip however she does not have this on today. She has no issues or complaints today. She denies signs of infection. 1/17; patient presents for follow-up. She has not been using Iodoflex to the wound bed. She reports acute pain. She denies increased warmth, erythema or purulent drainage to the left lower extremity. She states she has been using Tubigrip. She has information to order the compression stockings but has not obtained them. 1/24; Patient had a wound culture done at last clinic visit that grew Proteus mirabilis and E. coli. She was prescribed levofloxacin and this should cover both bacteria. She has been taking the medication over the past week with improvement of symptoms to the wound bed. She has been using Hydrofera Blue under Tubigrip. She states the [facility] is sticking to her wound bed. 1/31; patient presents for follow-up. She has been using Medihoney to the wound bed with  improvement in healing. She has been contacted by [facility] to order her antibiotic ointment. She is not sure yet if she is able to afford this. She received her compression stockings but states they did not fit well. She is working on returning these. She has been using Tubigrip. 2/7; patient presents for follow-up. She is been using Medihoney to the wound bed. She obtained her compression stockings and has been using these daily. She states that the [facility] antibiotic ointment is arriving in the mail tomorrow. She has no issues or complaints today. She denies systemic signs of infection. 2/14; patient presents for follow-up. She has been using Keystone antibiotic ointment to the wound bed. She states the [facility] is sticking and she has stopped this. She is been using her compression stockings daily. She does not have these on today. 2/28; patient presents for follow-up. She continues to use [facility] antibiotic ointment to the wound bed. There is been improvement in healing. She has been using her compression stockings daily. 3/12; patient presents for follow-up. She has been using Keystone antibiotic ointment to the wound bed. She is not wearing her compression stockings today but states she does wear them daily. She declines debridement today. 3/20; patient presents for follow-up. She has been using Keystone antibiotic ointment with Hydrofera Blue. She states she is wearing her compression stockings daily but she does not have them on today. 3/27; patient presents for follow-up. She has been using Keystone antibiotic ointment and hydrofera blue to the wound bed. She reports wearing compression stockings. 4/10; patient presents for follow-up. She has been using Keystone antibiotic ointment and Hydrofera Blue to the wound bed. She reports wearing compression stockings. She has no issues or complaints today. 4/24; patient presents for follow-up. She has been using Keystone antibiotic  ointment and Hydrofera Blue to the wound bed. Overall there is healthier tissue today. She reports wearing her compression stockings daily. 5/8; patient comes into clinic today with outer stocking on the left leg and with no Keystone. She has small wounds on the medial left lower leg and has been using Keystone and [employer]	[place] at home. 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 She has chronic venous hypertension and stage III lymphedema. She came in today saying she did not want debridement and really was not interested in compression wraps saying they fall down consistently. She does not have  compression pumps 6/20; patient has missed her last 3 follow-up appointments. She has been using Keystone antibiotic ointment and Hydrofera Blue. We discussed potentially doing an in office wrap and patient declined Today. She states she will try the in office wrap next week. She denies signs of infection. 7/3; patient presents for follow-up. She missed her last clinic appointment. She declines doing the in office wrap today. She has been using Hydrofera Blue with antibiotic ointment. 8/14; patient has not been here since the beginning of July. She has wounds secondary to chronic venous insufficiency and lymphedema on the left medial lower leg. She has supposedly been using [facility]. She is covering this with some form of foam dressing. She is using compression stockings from elastic therapy which are probably 20/30 mmHg although we just do not seem to be able to identify that. She tells me she puts those on during the day and leaves them on all night and only changing them once a day. 9/4; patient presents for follow-up. She has been using Hydrofera Blue to the wound bed. The wound is smaller. She has been wearing her compression stockings daily. 01-14-2023 upon evaluation today patient appears to be doing well currently in regard to her wounds. She is doing very similar to  when I last saw her. Fortunately I do not think that there is any signs of significant infection unfortunately I do believe that she is continuing to have some issues here with the open wounds and she should be seeing vascular shortly to look back into the venous ablation side of things. 01-21-2023 upon evaluation today patient appears to be doing well currently in regard to her wound. There are some slough and biofilm noted on the surface of the wound. With that being said she does not have any signs of infection at this point which is good news. No fevers, chills, nausea, vomiting, or diarrhea. MIRA, ROMIG ([id number]) [id number] Page 5 of 12 02-14-2023 upon evaluation today patient's wound appears to be doing about the same. She still will not allow for any debridement she states it hurts too bad she does have a venous ablation is being worked up again she was post to have it previous but she did not end up going through with that and then the approval timed on that they are having to review all the evaluation and approval process at this point. She does have an appointment in 2 weeks with the vascular surgeon to go over the results of her most recent venous study. 03-25-2023 upon evaluation today patient appears to be doing well currently in regard to her leg overall although she is having some increased pain this may be secondary to have an infection. Fortunately I do not see any signs of active infection systemically but locally it feels like that may be what is going on here based on what I am experiencing from the patient. 04-08-2023 upon evaluation today patient appears to be doing well currently in regard to her wound. This is actually showing signs of significant improvement and actually very pleased with where we stand today. I do not see any signs of active infection at this time. No fevers, chills, nausea, vomiting, or diarrhea. Electronic  Signature(s) Signed: 04/08/2023 4:39:05 PM By: Allen Derry PA-C Entered By: Allen Derry on 04/08/2023 16:39:05 -------------------------------------------------------------------------------- Physical Exam Details Patient Name: Date of Service: [name], WHEELEY MA J. 04/08/2023 3:45 PM Medical Record Number: [id number] Patient Account Number: [id number] Date of Birth/Sex: Treating RN: 09-30-1985 (  37 y.o. Freddy Finner Primary Care Provider: [name] Other Clinician: [name] Referring Provider: Treating Provider/Extender: Jaclynn Major in Treatment: 50 Constitutional Well-nourished and well-hydrated in no acute distress. Respiratory normal breathing without difficulty. Psychiatric this patient is able to make decisions and demonstrates good insight into disease process. Alert and Oriented x 3. pleasant and cooperative. Notes Upon inspection patient is doing well with the medication she is using the antibiotic, Zyvox, and this seems to be doing really well for her. I am very pleased where things stand at this time. I do not see any signs of worsening overall. Electronic Signature(s) Signed: 04/08/2023 4:39:27 PM By: Allen Derry PA-C Entered By: Allen Derry on 04/08/2023 16:39:27 -------------------------------------------------------------------------------- Physician Orders Details Patient Name: Date of Service: [name] Moody MA J. 04/08/2023 3:45 PM Medical Record Number: [id number] Patient Account Number: [id number] Date of Birth/Sex: Treating RN: December 02, 1985 (37 y.o. 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 Bathing/ [employer] wounds with antibacterial soap and water. May shower with wound dressing protected with water repellent cover or cast protector. No tub bath. Anesthetic (Use 'Patient Medications' Section for Anesthetic Order Entry) Lidocaine applied to wound bed Medications-Please add to medication list. ntibiotics - continue antibiotics as directed P.O. A Wound Treatment Wound #5 - Lower Leg Wound Laterality: Left, Medial Cleanser: Byram Ancillary Kit - 15 Day Supply (Generic) 3 x Per Week/30 Days Discharge Instructions: Use supplies as instructed; Kit contains: (15) Saline Bullets; (15) 3x3 Gauze; 15 pr Gloves Cleanser: Soap and Water 3 x Per Week/30 Days Discharge Instructions: Gently cleanse wound with antibacterial soap, rinse and pat dry prior to dressing wounds Cleanser: Vashe 5.8 (oz) 3 x Per Week/30 Days Discharge Instructions: Use vashe 5.8 (oz) as directed Peri-Wound Care: AandD Ointment 3 x Per Week/30 Days Discharge Instructions: Apply AandD Ointment as directed around edges to help dressing from sticking Prim Dressing: Hydrofera Blue Ready Transfer Foam, 2.5x2.5 (in/in) (Dispense As Written) 3 x Per Week/30 Days ary Discharge Instructions: Apply Hydrofera Blue Ready to wound bed as directed cut to fit wound bed Secondary Dressing: (BORDER) Zetuvit Plus SILICONE BORDER Dressing 4x4 (in/in) (Dispense As Written) 3 x Per Week/30 Days Discharge Instructions: Please do not put silicone bordered dressings under wraps. Use non-bordered dressing only. Electronic Signature(s) Signed: 04/08/2023 5:11:14  PM By: Allen Derry PA-C Entered By: [name] on 04/08/2023 16:36:21 -------------------------------------------------------------------------------- Problem List Details Patient Name: Date of Service: [name] Moody MA J. 04/08/2023 3:45 PM Medical Record Number: [id number] Patient Account Number: [id number] Date of Birth/Sex: Treating RN: 1985/10/06 (37 y.o. Freddy Finner Primary Care Provider: [name] Other Clinician: Kourtlynn, Nelms ([id number]) [id number].pdf Page 7 of 12 Referring Provider: Treating Provider/Extender: Jaclynn Major in Treatment: 50 Active Problems ICD-10 Encounter Code Description Active Date MDM Diagnosis I87.312 Chronic venous hypertension (idiopathic) with ulcer of left lower extremity 04/18/2022 No Yes L97.822 Non-pressure chronic ulcer of other part  of left lower leg with fat layer exposed1/06/2022 No Yes I89.0 Lymphedema, not elsewhere classified 04/18/2022 No Yes Inactive Problems Resolved Problems Electronic Signature(s) Signed: 04/08/2023 4:17:34 PM By: Allen Derry PA-C Entered By: Allen Derry on 04/08/2023 16:17:33 -------------------------------------------------------------------------------- Progress Note Details Patient Name: Date of Service: [name] Moody MA J. 04/08/2023 3:45 PM Medical Record Number: [id number] Patient Account Number: [id number] Date of Birth/Sex: Treating RN: 08-17-1985 (37 y.o. Freddy Finner Primary Care Provider: [name] Other Clinician: [name] Referring Provider: Treating Provider/Extender: Jaclynn Major in Treatment: 50 Subjective Chief Complaint Information obtained from Patient 04/18/2022; Left LE Ulcer History of Present Illness (HPI) 37 year old patient who started with having ulcerations on the right lower leg on the lateral part of her ankle for about 2 weeks. She was seen in the ER at [facility] and  advised to see the wound care for a consultation. No X-rays of workup was done during the ER visit and no prescription for any medications of compression wraps were given. the patient is not diabetic but does have hypertension and her medications have been reviewed by me. 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 The right and left greater saphenous vein was found to be tortuous. Deep venous system was also not competent and there was reflux of more than 500 ms. She was then seen by Dr. Karie Schwalbe Early who recommended that the patient would not benefit from endovenous ablation and he had recommended vein stripping odd on the right side and multiple small phlebectomy procedures on the left side. the patient did not follow-up due to social economic reasons. She has not been wearing any compression stockings and has not taken any specific treatment for varicose veins for the last 3 years. 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 We have obtained some appointments to see the vascular surgeons by the end of the month and the patient would like to follow up with me at my [facility] on Wednesday, June 29. 10/14/2014 -- she could not see me yesterday in [place] and hence has come for a review today. She has a vascular workup to be done this afternoon at [facility]. She is doing fine otherwise. JONTA, CHIEN ([id number]) [id number] Page 8 of 12 10/22/2014 -- she was seen by Dr. Brantley Fling and he has recommended surgical removal of her right saphenous vein from distal thigh to saphenofemoral odd junction and stab phlebectomy's of multiple large tributary branches throughout her thigh and calf. This would be  done under general anesthesia in the outpatient setting. 10/29/2014 -- she is trying to work on a surgical date and in the meanwhile we have got insurance clearance for Apligraf and we will start this next week. 7/22 2016 -- she is here for the first application of Apligraf. 11/19/2014 -- she is here for a second application of Apligraf 11/26/2014 -- she has done fine after her last application of Apligraf and is awaiting her surgery which is scheduled for August 31. 12/03/2014 -- she is doing fine and is here for her third application of Apligraf. 12/21/2014 -- She had surgery on 12/15/2014 by Dr.Early who did #1 ligation and stripping of right great saphenous vein from distal thigh to saphenofemoral junction, #  2 stab phlebectomy of large tributary varicose veins in the thigh popliteal space and calf. She had an Ace wrap up to her groin and this was removed today and the Unna's boot was also removed. 12/28/2014 -- she is here for her fourth application of Apligraf. 01/06/2015 - he saw her vascular surgeon Dr. Arbie Cookey who was pleased with her progress and he has confirmed that no surgical procedures could be attempted on the left side. 01/13/2015 -- her wound looks very good and she's been having no problems whatsoever. Readmission: 07/26/2020 upon evaluation today patient presents for initial inspection here in our clinic for a new issue with her left leg although she is previously been seen due to issues with the right leg back in 2016. At that time she was seeing Dr. Arbie Cookey who is a vein/vascular specialist in [place]. He has since semiretired from what I understand. He is working out of [employer] I believe. Nonetheless she tells me at the time that there was really nothing to do for her left leg although the right leg was where they did most of the work. Subsequently she states that she is done fairly well until just in the past week where she had issues with bleeding from what appears to be  varicose vein on the left leg medially. Unfortunately this has continued to be an issue although she tells me at first it was coming much more significantly Down quite a bit but nonetheless has not completely resolved. Every time she showers she notices that it starts to drain a little bit more. She does have a history of chronic venous insufficiency, lymphedema, varicose veins bilaterally, and obesity. 08/02/2020 upon evaluation today patient appears to be doing about the same in regards to the ulcer on her left leg. She has some eschar covering there is definitely some fluid collecting underneath unfortunately. With that being said she tells me she is still having a tremendous amount of pain therefore she is really not able to allow me to clean this off very effectively to be perfectly honest. I think we need to try to soften this up 08/16/2020 upon evaluation today patient's wound is really not doing significantly better not really states about the same. She notes that the wrap just does not seem to be staying up very well at all unfortunately. No fevers, chills, nausea, vomiting, or diarrhea. She did cut it off once it starts to slide in order to alleviate some of the pressure from sliding Down. Fortunately there is no signs of active infection at this time which is great news. 08/23/2020 upon evaluation today patient appears to be doing well 08/23/2020 upon evaluation today patient appears to be doing well with regard to her wound all things considered. Fortunately there does not appear to be any signs of active infection at this time which is great news. She has been tolerating the dressing changes without complication and overall I am extremely pleased with where things stand at this point. She does have her appointment with vascular in [facility] on June 9. 08/30/2020 upon evaluation today patient actually appears to be doing decently well in regard to her wound. Fortunately there is no signs of  active infection which is great news. Nonetheless I do believe that the patient is going require little bit of debridement if she is okay with me attempting that today I think that will help clean off some of the necrotic tissue. Fortunately there does not appear to be otherwise any evidence of active  infection which is also great news. 09/19/2020 upon evaluation today patient appears to be doing a little better in regard to her wound as compared to previous. Fortunately there does not appear to be any signs of active infection overall. No fever chills noted. I do believe that the Iodosorb has made this a little bit better with regard to the overall size and appearance of the wound bed though again she does still have quite a ways to go to get this to heal she still very tender to touch. 09/27/2020 upon evaluation today patient appears to actually be doing quite well with regard to her wound. This is measuring much smaller which is great news. With that being said she did see vein and vascular in [facility] and they subsequently recommended that surgery is really what she probably needs to go forward with sounds like the potential for venous ablation. With that being said the patient tells me this is just not the right time for her to be able to proceed with any type of surgery which I completely understand. Nonetheless I do believe that she would continue to benefit from compression but again that is really not something that she is able to easily do. 10/04/2020 upon evaluation today patient appears to be doing about the same in regard to her wound. This is measuring a little bit smaller but nonetheless still is open and again has some slough and biofilm noted on the surface of the wound. There does not appear to be any signs of active infection which is great news. No fevers, chills, nausea, vomiting, or diarrhea. 10/04/2020 upon evaluation today patient appears to be doing well with regard to her wound.  She has been tolerating dressing changes without complication. Fortunately there does not appear to be any signs of active infection which is great news. No fevers, chills, nausea, vomiting, or diarrhea. 10/25/2020 upon evaluation today patient appears to be doing well with regard to her wound. She has been tolerating the dressing changes without complication. Fortunately there is no signs of active infection at this time. No fevers, chills, nausea, vomiting, or diarrhea. 11/01/2020 upon evaluation today patient with regard to her wound. She has been tolerating the dressing changes without complication. Fortunately there does not appear to be any signs of infection which is great news. No fever chills noted 11/15/2020 upon evaluation today patient appears to be doing well with regard to her wound. Fortunately there is no signs of active infection at this time. No fevers, chills, nausea, vomiting, or diarrhea. With that being said she continues to have a significant amount of pain at the site even though this is very close to complete closure. She also had several varicose veins around the area which were also problematic. Overall however I feel like the patient is making excellent progress. 11/28/2020 upon evaluation today patient appears to be doing well with regard to her leg ulcer. Again were not really able to debride or compression wrap her due to discomfort and pain. She does not allow for that. With that being said we have been using Iodosorb which does seem to be doing decently well. Fortunately there is no signs of active infection at this time which is great news. No fevers, chills, nausea, vomiting, or diarrhea. 8/31; patient presents for 2-week follow-up. She has been using Iodosorb. She reports that the wound is closed. She denies signs of infection. Readmission: 10-27-2021 upon evaluation this is a patient that presents today whom I have previously seen this  is pretty much for the same issue  though I think a little bit higher than the last time I saw her. She does have a history of chronic venous insufficiency and hypertension along with varicose veins. Subsequently she does have an ulceration which spontaneously ruptured she has not been wearing any compression which I think is a big part of the issue here. We discussed this before I really think she probably needs to be wearing her compression therapy, she probably needs lymphedema pumps if she can ever wear the compression for a significant amount of time to get these, and subsequently also think that she needs to be elevating her legs is much as possible she may even need some vascular intervention in regard to her veins. All of this was reiterated and discussed with her today to reinforce what needs to happen in order to ensure that her legs do not get a lot worse. The patient voiced understanding. She tells me that she knows because she is seeing her mom go through a lot of this as well how bad things can get. 11-03-2021 upon evaluation today patient appears to be doing well with regard to her wound. Fortunately there does not appear to be any signs of active FERREL, TURCO ([id number]) [id number] Page 9 of 12 infection at this time. She is measuring a little bit bigger but I think this is because the wound is actually cleaning up a bit here. 7/27; left lateral leg wound not any smaller but perhaps with a cleaner surface. She is using Iodoflex to help with the latter and using Tubigrip. She has chronic venous insufficiency with secondary lymphedema. She is apparently followed by vein and vascular and is being scheduled for an ablation 11-17-2021 upon evaluation today patient appears to be doing well with regard to her wound this is actually showing signs of improvement which is great news. Fortunately I do not see any evidence of active infection locally or systemically at this time which is great news. No  fevers, chills, nausea, vomiting, or diarrhea. 11-24-2021 upon evaluation today patient's wound is actually showing signs of significant improvement. Unfortunately she had quite a bit of pain with debridement last week I do believe it was beneficial but at the same time she is doing much better but still really does not want this debrided again I think being that it is looking a whole lot better I would try to avoid that today especially since it caused her so much discomfort that is really not the goal and I explained that to the patient today she voiced understanding and knows that it needed to be done but still states that it was quite painful. 11-30-2021 upon evaluation today patient appears to be doing excellent in regard to her wound this is actually showing signs of excellent improvement I am very pleased with where things stand. She does have her venous ablation appointment for October 17. 12-21-2021 upon evaluation today patient appears to be doing better in regard to her wound this is measuring smaller and looking better as well. Fortunately I do not see any signs of active infection locally or systemically which is great news. 01-01-2022 upon evaluation today patient appears to be doing well currently in regard to her wound. She is showing signs of improvement which is great news and overall I do not see any signs of active infection locally or systemically at this time. 01-08-2022 upon evaluation today patient appears to be doing well currently in regard to her  wound she is actually showing signs of significant improvement which is great news. Fortunately I do not see any evidence of active infection locally or systemically at this time. I do believe that we are on the right track. She also has her appointment October 19 for the venous ablation. 01-16-2022 upon evaluation today patient's wound actually is showing some signs of improvement although this is very slow. Fortunately I do not see  any evidence of infection at this time. The volume is a little bit more although the size is smaller I think this is due to the fact that we are slowly cleaning this area out effectively. 11/6 continued improvement the patient is using Iodoflex and a Tubigrip E. She was supposed to have venous surgery at vein and vascular in [place] however somehow this is gotten delayed till December 14. 02-27-2022 upon evaluation today patient's wound is actually showing signs of being completely healed. Fortunately I do not see any evidence of active infection locally or systemically which is great news and overall I am extremely pleased with where we stand today. Unfortunately she does tell me that she postpone her venous ablation surgery until December 14 she tells me that she was not ready "financially" for this. 04/18/2022; [name] is a 37 year old female with a past medical history of venous insufficiency that presents the clinic for a left lower extremity wound. She was seen almost 2 months ago for the same wound. This had healed with Iodoflex and Tubigrip. She states that the wound recently reopened. She has seen vein and vascular and plan is for ligation and stripping of the left great saphenous vein. She is not sure when this procedure is going to be scheduled. She has canceled it once before. She has had office compression wraps in the past however these do not stay on and create more of an issue for her. She would like to avoid this. She currently denies signs of infection. 1/10; patient presents for follow-up. She has been using Iodoflex to the wound bed. She states she has been using her Tubigrip however she does not have this on today. She has no issues or complaints today. She denies signs of infection. 1/17; patient presents for follow-up. She has not been using Iodoflex to the wound bed. She reports acute pain. She denies increased warmth, erythema or purulent drainage to the left lower  extremity. She states she has been using Tubigrip. She has information to order the compression stockings but has not obtained them. 1/24; Patient had a wound culture done at last clinic visit that grew Proteus mirabilis and E. coli. She was prescribed levofloxacin and this should cover both bacteria. She has been taking the medication over the past week with improvement of symptoms to the wound bed. She has been using Hydrofera Blue under Tubigrip. She states the [facility] is sticking to her wound bed. 1/31; patient presents for follow-up. She has been using Medihoney to the wound bed with improvement in healing. She has been contacted by [facility] to order her antibiotic ointment. She is not sure yet if she is able to afford this. She received her compression stockings but states they did not fit well. She is working on returning these. She has been using Tubigrip. 2/7; patient presents for follow-up. She is been using Medihoney to the wound bed. She obtained her compression stockings and has been using these daily. She states that the [facility] antibiotic ointment is arriving in the mail tomorrow. She has no  issues or complaints today. She denies systemic signs of infection. 2/14; patient presents for follow-up. She has been using [facility] antibiotic ointment to the wound bed. She states the [facility] is sticking and she has stopped this. She is been using her compression stockings daily. She does not have these on today. 2/28; patient presents for follow-up. She continues to use [facility] antibiotic ointment to the wound bed. There is been improvement in healing. She has been using her compression stockings daily. 3/12; patient presents for follow-up. She has been using Keystone antibiotic ointment to the wound bed. She is not wearing her compression stockings today but states she does wear them daily. She declines debridement today. 3/20; patient presents for follow-up. She has been  using Keystone antibiotic ointment with Hydrofera Blue. She states she is wearing her compression stockings daily but she does not have them on today. 3/27; patient presents for follow-up. She has been using Keystone antibiotic ointment and hydrofera blue to the wound bed. She reports wearing compression stockings. 4/10; patient presents for follow-up. She has been using Keystone antibiotic ointment and Hydrofera Blue to the wound bed. She reports wearing compression stockings. She has no issues or complaints today. 4/24; patient presents for follow-up. She has been using Keystone antibiotic ointment and Hydrofera Blue to the wound bed. Overall there is healthier tissue today. She reports wearing her compression stockings daily. 5/8; patient comes into clinic today with outer stocking on the left leg and with no Keystone. She has small wounds on the medial left lower leg and has been using Keystone and [employer]	[place] at home. 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 She has chronic venous hypertension and stage III lymphedema. She came in today saying she did not want debridement and really was not interested in compression wraps saying they fall down consistently. She does not have compression pumps 6/20; patient has missed her last 3 follow-up appointments. She has been using Keystone antibiotic ointment and Hydrofera Blue. We discussed potentially doing an in office wrap and patient declined Today. She states she will try the in office wrap next week. She denies signs of infection. [name], [name] ([id number]) [id number] Page 10 of 12 7/3; patient presents for follow-up. She missed her last clinic appointment. She declines doing the in office wrap today. She has been using Hydrofera Blue with antibiotic ointment. 8/14; patient has not been here since the beginning of July. She has wounds secondary to chronic venous insufficiency and lymphedema on  the left medial lower leg. She has supposedly been using [facility]. She is covering this with some form of foam dressing. She is using compression stockings from elastic therapy which are probably 20/30 mmHg although we just do not seem to be able to identify that. She tells me she puts those on during the day and leaves them on all night and only changing them once a day. 9/4; patient presents for follow-up. She has been using Hydrofera Blue to the wound bed. The wound is smaller. She has been wearing her compression stockings daily. 01-14-2023 upon evaluation today patient appears to be doing well currently in regard to her wounds. She is doing very similar to when I last saw her. Fortunately I do not think that there is any signs of significant infection unfortunately I do believe that she is continuing to have some issues here with the open wounds and she should be seeing vascular shortly to look back into  the venous ablation side of things. 01-21-2023 upon evaluation today patient appears to be doing well currently in regard to her wound. There are some slough and biofilm noted on the surface of the wound. With that being said she does not have any signs of infection at this point which is good news. No fevers, chills, nausea, vomiting, or diarrhea. 02-14-2023 upon evaluation today patient's wound appears to be doing about the same. She still will not allow for any debridement she states it hurts too bad she does have a venous ablation is being worked up again she was post to have it previous but she did not end up going through with that and then the approval timed on that they are having to review all the evaluation and approval process at this point. She does have an appointment in 2 weeks with the vascular surgeon to go over the results of her most recent venous study. 03-25-2023 upon evaluation today patient appears to be doing well currently in regard to her leg overall although  she is having some increased pain this may be secondary to have an infection. Fortunately I do not see any signs of active infection systemically but locally it feels like that may be what is going on here based on what I am experiencing from the patient. 04-08-2023 upon evaluation today patient appears to be doing well currently in regard to her wound. This is actually showing signs of significant improvement and actually very pleased with where we stand today. I do not see any signs of active infection at this time. No fevers, chills, nausea, vomiting, or diarrhea. Objective Constitutional Well-nourished and well-hydrated in no acute distress. Vitals Time Taken: 4:07 PM, Weight: 400 lbs, Temperature: 97.9 F, Pulse: 84 bpm, Respiratory Rate: 18 breaths/min, Blood Pressure: 125/95 mmHg. Respiratory normal breathing without difficulty. Psychiatric this patient is able to make decisions and demonstrates good insight into disease process. Alert and Oriented x 3. pleasant and cooperative. General Notes: Upon inspection patient is doing well with the medication she is using the antibiotic, Zyvox, and this seems to be doing really well for her. I am very pleased where things stand at this time. I do not see any signs of worsening overall. Integumentary (Hair, Skin) Wound #5 status is Open. Original cause of wound was Gradually Appeared. The date acquired was: 04/10/2022. The wound has been in treatment 50 weeks. The wound is located on the Left,Medial Lower Leg. The wound measures 0.3cm length x 1cm width x 0.2cm depth; 0.236cm^2 area and 0.047cm^3 volume. There is Fat Layer (Subcutaneous Tissue) exposed. There is a medium amount of serosanguineous drainage noted. There is medium (34-66%) red granulation within the wound bed. There is a medium (34-66%) amount of necrotic tissue within the wound bed including Adherent Slough. Assessment Active Problems ICD-10 Chronic venous hypertension (idiopathic)  with ulcer of left lower extremity Non-pressure chronic ulcer of other part of left lower leg with fat layer exposed Lymphedema, not elsewhere classified Procedures Wound #5 Pre-procedure diagnosis of Wound #5 is a Venous Leg Ulcer located on the Left,Medial Lower Leg .Severity of Tissue Pre Debridement is: Fat layer exposed. There was a Excisional Skin/Subcutaneous Tissue Debridement with a total area of 0.24 sq cm performed by Allen Derry, PA-C. With the following instrument(s): Curette to remove Viable and Non-Viable tissue/material. Material removed includes Subcutaneous Tissue, Slough, and Biofilm. A time out was conducted at 16:26, prior to the start of the procedure. A Minimum amount of bleeding was controlled with  Pressure. The procedure was tolerated well. Post Debridement Measurements: 0.3cm length x 1cm width x 0.2cm depth; 0.047cm^3 volume. Character of Wound/Ulcer Post Debridement is stable. Severity of Tissue Post Debridement is: Fat layer exposed. BERTIE, BURCHILL ([id number]) [id number] Page 11 of 12 Post procedure Diagnosis Wound #5: Same as Pre-Procedure Plan Follow-up Appointments: Wound #5 Left,Medial Lower Leg: Return Appointment in 2 weeks. Bathing/ Shower/ Hygiene: Wash wounds with antibacterial soap and water. May shower with wound dressing protected with water repellent cover or cast protector. No tub bath. Anesthetic (Use 'Patient Medications' Section for Anesthetic Order Entry): Lidocaine applied to wound bed Medications-Please add to medication list.: P.O. Antibiotics - continue antibiotics as directed WOUND #5: - Lower Leg Wound Laterality: Left, Medial Cleanser: Byram Ancillary Kit - 15 Day Supply (Generic) 3 x Per Week/30 Days Discharge Instructions: Use supplies as instructed; Kit contains: (15) Saline Bullets; (15) 3x3 Gauze; 15 pr Gloves Cleanser: Soap and Water 3 x Per Week/30 Days Discharge Instructions: Gently cleanse wound  with antibacterial soap, rinse and pat dry prior to dressing wounds Cleanser: Vashe 5.8 (oz) 3 x Per Week/30 Days Discharge Instructions: Use vashe 5.8 (oz) as directed Peri-Wound Care: AandD Ointment 3 x Per Week/30 Days Discharge Instructions: Apply AandD Ointment as directed around edges to help dressing from sticking Prim Dressing: Hydrofera Blue Ready Transfer Foam, 2.5x2.5 (in/in) (Dispense As Written) 3 x Per Week/30 Days ary Discharge Instructions: Apply Hydrofera Blue Ready to wound bed as directed cut to fit wound bed Secondary Dressing: (BORDER) Zetuvit Plus SILICONE BORDER Dressing 4x4 (in/in) (Dispense As Written) 3 x Per Week/30 Days Discharge Instructions: Please do not put silicone bordered dressings under wraps. Use non-bordered dressing only. 1. Based on what I am seeing I am going to recommend that we have the patient going continue to monitor for any signs of infection or worsening. 2. I am good recommend as well patient should continue with the [facility] which I think is doing quite well. 3. Will continue with the bordered foam dressing to cover. We will see patient back for reevaluation in 1 week here in the clinic. If anything worsens or changes patient will contact our office for additional recommendations. Electronic Signature(s) Signed: 04/08/2023 4:39:49 PM By: Allen Derry PA-C Entered By: Allen Derry on 04/08/2023 16:39:48 -------------------------------------------------------------------------------- SuperBill Details Patient Name: Date of Service: [name] Moody MA J. 04/08/2023 Medical Record Number: [id number] Patient Account Number: [id number] Date of Birth/Sex: Treating RN: April 09, 1986 (37 y.o. Freddy Finner Primary Care Provider: [name] Other Clinician: [name] Referring Provider: Treating Provider/Extender: Jaclynn Major in Treatment: 50 Diagnosis Coding ICD-10 Codes Code Description [id number] Chronic venous  hypertension (idiopathic) with ulcer of left lower extremity L97.822 Non-pressure chronic ulcer of other part of left lower leg with fat layer exposed I89.0 Lymphedema, not elsewhere classified MAYBRIE, [name] ([id number]) [id number] Page 12 of 12 Facility Procedures : CPT4 Code: [id number] Description: 11042 - DEB SUBQ TISSUE 20 SQ CM/< ICD-10 Diagnosis Description L97.822 Non-pressure chronic ulcer of other part of left lower leg with fat layer expo Modifier: sed Quantity: 1 Physician Procedures : CPT4 Code Description Modifier 11042 11042 - WC PHYS SUBQ TISS 20 SQ CM ICD-10 Diagnosis Description L97.822 Non-pressure chronic ulcer of other part of left lower leg with fat layer exposed Quantity: 1 Electronic Signature(s) Signed: 04/08/2023 4:39:58 PM By: Allen Derry PA-C Entered By: Allen Derry on 04/08/2023 16:39:58

## 2023-04-22 ENCOUNTER — Ambulatory Visit: Payer: BC Managed Care – PPO | Admitting: Physician Assistant

## 2023-04-24 ENCOUNTER — Ambulatory Visit: Payer: Self-pay | Admitting: Physician Assistant

## 2023-04-29 ENCOUNTER — Ambulatory Visit: Payer: BC Managed Care – PPO | Admitting: Surgery

## 2023-05-01 ENCOUNTER — Encounter: Payer: BLUE CROSS/BLUE SHIELD | Attending: Physician Assistant | Admitting: Physician Assistant

## 2023-05-01 DIAGNOSIS — I10 Essential (primary) hypertension: Secondary | ICD-10-CM | POA: Diagnosis not present

## 2023-05-01 DIAGNOSIS — I87312 Chronic venous hypertension (idiopathic) with ulcer of left lower extremity: Secondary | ICD-10-CM | POA: Insufficient documentation

## 2023-05-01 DIAGNOSIS — L97822 Non-pressure chronic ulcer of other part of left lower leg with fat layer exposed: Secondary | ICD-10-CM | POA: Diagnosis not present

## 2023-05-01 DIAGNOSIS — I890 Lymphedema, not elsewhere classified: Secondary | ICD-10-CM | POA: Diagnosis not present

## 2023-05-02 NOTE — Progress Notes (Addendum)
CAI, BABAUTA ([id number]) [id number] Page 1 of 9 Visit Report for 05/01/2023 Arrival Information Details Patient Name: Date of Service: [name], [name] [place]. 05/01/2023 3:30 PM Medical Record Number: [id number] Patient Account Number: [id number] Date of Birth/Sex: Treating RN: 11-21-1985 (38 y.o. Freddy Finner Primary Care Izayah Miner: [name] Other Clinician: [name] Referring Eric Nees: Treating Malky Rudzinski/Extender: Jaclynn Major in Treatment: 72 Visit Information History Since Last Visit All ordered tests and consults were completed: No Patient Arrived: Ambulatory Added or deleted any medications: No Arrival Time: 16:04 Any new allergies or adverse reactions: No Transfer Assistance: None Had a fall or experienced change in No Patient Identification Verified: Yes activities of daily living that may affect Secondary Verification Process Completed: Yes risk of falls: Patient Requires Transmission-Based Precautions: No Signs or symptoms of abuse/neglect since last visito No Patient Has Alerts: No Hospitalized since last visit: No Implantable device outside of the clinic excluding No cellular tissue based products placed in the center since last visit: Has Dressing in Place as Prescribed: Yes Has Compression in Place as Prescribed: No Pain Present Now: No Electronic Signature(s) Signed: 05/01/2023 5:04:52 PM By: [name] Entered By: [name] on 05/01/2023 16:09:42 -------------------------------------------------------------------------------- Clinic Level of Care Assessment Details Patient Name: Date of Service: [name], [name] [place] J. 05/01/2023 3:30 PM Medical Record Number: [id number] Patient Account Number: [id number] Date of Birth/Sex: Treating RN: 1985/05/02 (38 y.o. Freddy Finner Primary Care Shemia Bevel: [name] Other Clinician: [name] Referring Americus Scheurich: Treating Ikenna Ohms/Extender:  Jaclynn Major in Treatment: 54 Clinic Level of Care Assessment Items TOOL 4 Quantity Score []  - 0 Use when only an EandM is performed on FOLLOW-UP visit ASSESSMENTS - Nursing Assessment / Reassessment X- 1 10 Reassessment of Co-morbidities (includes updates in patient status) [name], [name] ([id number]) [id number] Page 2 of 9 X- 1 5 Reassessment of Adherence to Treatment Plan ASSESSMENTS - Wound and Skin A ssessment / Reassessment X - Simple Wound Assessment / Reassessment - one wound 1 5 []  - 0 Complex Wound Assessment / Reassessment - multiple wounds []  - 0 Dermatologic / Skin Assessment (not related to wound area) ASSESSMENTS - Focused Assessment []  - 0 Circumferential Edema Measurements - multi extremities []  - 0 Nutritional Assessment / Counseling / Intervention []  - 0 Lower Extremity Assessment (monofilament, tuning fork, pulses) []  - 0 Peripheral Arterial Disease Assessment (using hand held doppler) ASSESSMENTS - Ostomy and/or Continence Assessment and Care []  - 0 Incontinence Assessment and Management []  - 0 Ostomy Care Assessment and Management (repouching, etc.) PROCESS - Coordination of Care X - Simple Patient / Family Education for ongoing care 1 15 []  - 0 Complex (extensive) Patient / Family Education for ongoing care []  - 0 Staff obtains Chiropractor, Records, T Results / Process Orders est []  - 0 Staff telephones HHA, Nursing Homes / Clarify orders / etc []  - 0 Routine Transfer to another Facility (non-emergent condition) []  - 0 Routine Hospital Admission (non-emergent condition) []  - 0 New Admissions / Manufacturing engineer / Ordering NPWT Apligraf, etc. , []  - 0 Emergency Hospital Admission (emergent condition) X- 1 10 Simple Discharge Coordination []  - 0 Complex (extensive) Discharge Coordination PROCESS - Special Needs []  - 0 Pediatric / Minor Patient Management []  - 0 Isolation Patient  Management []  - 0 Hearing / Language / Visual special needs []  - 0 Assessment of Community assistance (transportation, D/C planning, etc.) []  - 0 Additional assistance / Altered mentation []  - 0 Support Surface(s) Assessment (bed,  cushion, seat, etc.) INTERVENTIONS - Wound Cleansing / Measurement X - Simple Wound Cleansing - one wound 1 5 []  - 0 Complex Wound Cleansing - multiple wounds X- 1 5 Wound Imaging (photographs - any number of wounds) []  - 0 Wound Tracing (instead of photographs) X- 1 5 Simple Wound Measurement - one wound []  - 0 Complex Wound Measurement - multiple wounds INTERVENTIONS - Wound Dressings []  - 0 Small Wound Dressing one or multiple wounds X- 1 15 Medium Wound Dressing one or multiple wounds []  - 0 Large Wound Dressing one or multiple wounds []  - 0 Application of Medications - topical []  - 0 Application of Medications - injection INTERVENTIONS - Miscellaneous ARVIS, GERVASI ([id number]) [id number] Page 3 of 9 []  - 0 External ear exam []  - 0 Specimen Collection (cultures, biopsies, blood, body fluids, etc.) []  - 0 Specimen(s) / Culture(s) sent or taken to Lab for analysis []  - 0 Patient Transfer (multiple staff / Michiel Sites Lift / Similar devices) []  - 0 Simple Staple / Suture removal (25 or less) []  - 0 Complex Staple / Suture removal (26 or more) []  - 0 Hypo / Hyperglycemic Management (close monitor of Blood Glucose) []  - 0 Ankle / Brachial Index (ABI) - do not check if billed separately X- 1 5 Vital Signs Has the patient been seen at the hospital within the last three years: Yes Total Score: 80 Level Of Care: New/Established - Level 3 Electronic Signature(s) Signed: 05/01/2023 5:04:52 PM By: [name] Entered By: [name] on 05/01/2023 16:34:09 -------------------------------------------------------------------------------- Encounter Discharge Information Details Patient Name: Date of  Service: [name] Moody [place] J. 05/01/2023 3:30 PM Medical Record Number: [id number] Patient Account Number: [id number] Date of Birth/Sex: Treating RN: 01-Jun-1985 (37 y.o. Freddy Finner Primary Care Kenta Laster: [name] Other Clinician: [name] Referring Sugey Trevathan: Treating Megin Consalvo/Extender: Jaclynn Major in Treatment: 80 Encounter Discharge Information Items Discharge Condition: Stable Ambulatory Status: Ambulatory Discharge Destination: Home Transportation: Private Auto Accompanied By: self Schedule Follow-up Appointment: Yes Clinical Summary of Care: Electronic Signature(s) Signed: 05/01/2023 5:04:52 PM By: [name] Entered By: [name] on 05/01/2023 16:53:30 Moncayo, [name] ([id number]) [id number] Page 4 of 9 -------------------------------------------------------------------------------- Lower Extremity Assessment Details Patient Name: Date of Service: [name], [name] [place]. 05/01/2023 3:30 PM Medical Record Number: [id number] Patient Account Number: [id number] Date of Birth/Sex: Treating RN: 1986-01-14 (38 y.o. Freddy Finner Primary Care Latreshia Beauchaine: [name] Other Clinician: [name] Referring Akiera Allbaugh: Treating Kalisa Girtman/Extender: Jaclynn Major in Treatment: 54 Edema Assessment Assessed: [name] Yes] [Right: No] Edema: [Left: Ye] [Right: s] Calf Left: Right: Point of Measurement: 32 cm From Medial Instep 57 cm Ankle Left: Right: Point of Measurement: 12 cm From Medial Instep 32 cm Vascular Assessment Pulses: Dorsalis Pedis Palpable: [Left:Yes] Electronic Signature(s) Signed: 05/01/2023 5:04:52 PM By: [name] Signed: 05/03/2023 8:18:57 AM By: Yevonne Pax RN Entered By: [name] on 05/01/2023 16:18:22 -------------------------------------------------------------------------------- Multi Wound Chart Details Patient Name: Date of Service: [name] Moody MA  J. 05/01/2023 3:30 PM Medical Record Number: [id number] Patient Account Number: [id number] Date of Birth/Sex: Treating RN: 01/19/1986 (37 y.o. Freddy Finner Primary Care Lelania Bia: [name] Other Clinician: [name] Referring [place]akinah Rosamond: Treating Briton Sellman/Extender: Jaclynn Major in Treatment: 56 Vital Signs Height(in): Pulse(bpm): 103 Weight(lbs): 400 Blood Pressure(mmHg): 146/81 Body Mass Index(BMI): Temperature(F): 98.3 Respiratory Rate(breaths/min): 18 [5:Photos:] [N/A:N/A] Left, Medial Lower Leg N/A N/A Wound Location: Gradually Appeared N/A N/A Wounding Event: Venous Leg Ulcer N/A N/A Primary Etiology: Hypertension,  Peripheral Venous N/A N/A Comorbid History: Disease 04/10/2022 N/A N/A Date Acquired: 77 N/A N/A Weeks of Treatment: Open N/A N/A Wound Status: No N/A N/A Wound Recurrence: 0.3x0.9x0.2 N/A N/A Measurements L x W x D (cm) 0.212 N/A N/A A (cm) : rea 0.042 N/A N/A Volume (cm) : 96.10% N/A N/A % Reduction in Area: 96.20% N/A N/A % Reduction in Volume: Full Thickness Without Exposed N/A N/A Classification: Support Structures Medium N/A N/A Exudate Amount: Serosanguineous N/A N/A Exudate Type: red, brown N/A N/A Exudate Color: Medium (34-66%) N/A N/A Granulation Amount: Red N/A N/A Granulation Quality: Medium (34-66%) N/A N/A Necrotic Amount: Fat Layer (Subcutaneous Tissue): Yes N/A N/A Exposed Structures: Fascia: No Tendon: No Muscle: No Joint: No Bone: No Small (1-33%) N/A N/A Epithelialization: Treatment Notes Electronic Signature(s) Signed: 05/01/2023 5:04:52 PM By: [name] Entered By: [name] on 05/01/2023 16:18:32 -------------------------------------------------------------------------------- Multi-Disciplinary Care Plan Details Patient Name: Date of Service: [name] Moody MA J. 05/01/2023 3:30 PM Medical Record Number: [id number] Patient Account Number: [id number] Date of  Birth/Sex: Treating RN: Jan 15, 1986 (37 y.o. Freddy Finner Primary Care Evonte Prestage: [name] Other Clinician: [name] Referring Mayuri Staples: Treating Ariah Mower/Extender: Jaclynn Major in Treatment: 65 Active Inactive Wound/Skin Impairment Nursing Diagnoses: Knowledge deficit related to ulceration/compromised skin integrity Goals: Patient/caregiver will verbalize understanding of skin care regimen Date Initiated: 04/18/2022 Target Resolution Date: 07/18/2022 Goal Status: Active Ulcer/skin breakdown will have a volume reduction of 30% by week 4 Date Initiated: 04/18/2022 Date Inactivated: 07/04/2022 Target Resolution Date: 05/19/2022 Goal Status: Unmet Unmet Reason: comorbidities Ulcer/skin breakdown will have a volume reduction of 50% by week 8 [name], [name] ([id number]) [phone].pdf Page 6 of 9 Date Initiated: 04/18/2022 Date Inactivated: 07/04/2022 Target Resolution Date: 06/18/2022 Goal Status: Unmet Unmet Reason: comorbidities Ulcer/skin breakdown will have a volume reduction of 80% by week 12 Date Initiated: 04/18/2022 Target Resolution Date: 07/18/2022 Goal Status: Active Ulcer/skin breakdown will heal within 14 weeks Date Initiated: 04/18/2022 Target Resolution Date: 08/17/2022 Goal Status: Active Interventions: Assess patient/caregiver ability to obtain necessary supplies Assess patient/caregiver ability to perform ulcer/skin care regimen upon admission and as needed Assess ulceration(s) every visit Notes: Electronic Signature(s) Signed: 05/01/2023 5:04:52 PM By: [name] Signed: 05/03/2023 8:18:57 AM By: Yevonne Pax RN Entered By: [name] on 05/01/2023 16:51:00 -------------------------------------------------------------------------------- Pain Assessment Details Patient Name: Date of Service: [name] Moody MA J. 05/01/2023 3:30 PM Medical Record Number: [id number] Patient Account Number: [id number] Date of  Birth/Sex: Treating RN: Apr 16, 1986 (37 y.o. Freddy Finner Primary Care Hadassa Cermak: [name] Other Clinician: [name] Referring Kinzley Savell: Treating Beau Vanduzer/Extender: Jaclynn Major in Treatment: 60 Active Problems Location of Pain Severity and Description of Pain Patient Has Paino No Site Locations Pain Management and Medication Current Pain Management: Electronic Signature(s) Signed: 05/01/2023 5:04:52 PM By: [name] Signed: 05/03/2023 8:18:57 AM By: Yevonne Pax RN Levada Schilling, [place] J ([id number]) [id number] Page 7 of 9 Entered By: [name] on 05/01/2023 16:12:51 -------------------------------------------------------------------------------- Patient/Caregiver Education Details Patient Name: Date of Service: SHREEYA, GAVITT [place] 1/15/2025andnbsp3:30 PM Medical Record Number: [id number] Patient Account Number: [id number] Date of Birth/Gender: Treating RN: 1985-11-30 (38 y.o. Freddy Finner Primary Care Physician: [name] Other Clinician: [name] Referring Physician: Treating Physician/Extender: Jaclynn Major in Treatment: 26 Education Assessment Education Provided To: Patient Education Topics Provided Wound/Skin Impairment: Handouts: Other: continue wound care as directed Methods: Explain/Verbal Responses: State content correctly Electronic Signature(s) Signed: 05/01/2023 5:04:52 PM By: [name] Entered By: [name] on 05/01/2023  16:51:39 -------------------------------------------------------------------------------- Wound Assessment Details Patient Name: Date of Service: [name], [name] [place] J. 05/01/2023 3:30 PM Medical Record Number: [id number] Patient Account Number: [id number] Date of Birth/Sex: Treating RN: 02-13-1986 (38 y.o. Freddy Finner Primary Care Brinlee Gambrell: [name] Other Clinician: [name] Referring Jailah Willis: Treating  Shareef Eddinger/Extender: Jaclynn Major in Treatment: 54 Wound Status Wound Number: 5 Primary Etiology: Venous Leg Ulcer Wound Location: Left, Medial Lower Leg Wound Status: Open Wounding Event: Gradually Appeared Comorbid History: Hypertension, Peripheral Venous Disease Date Acquired: 04/10/2022 Weeks Of Treatment: 54 Clustered Wound: No Photos [name], [name] ([id number]) [phone].pdf Page 8 of 9 Wound Measurements Length: (cm) 0.3 Width: (cm) 0.9 Depth: (cm) 0.2 Area: (cm) 0.212 Volume: (cm) 0.042 % Reduction in Area: 96.1% % Reduction in Volume: 96.2% Epithelialization: Small (1-33%) Wound Description Classification: Full Thickness Without Exposed Support Structures Exudate Amount: Medium Exudate Type: Serosanguineous Exudate Color: red, brown Foul Odor After Cleansing: No Slough/Fibrino Yes Wound Bed Granulation Amount: Medium (34-66%) Exposed Structure Granulation Quality: Red Fascia Exposed: No Necrotic Amount: Medium (34-66%) Fat Layer (Subcutaneous Tissue) Exposed: Yes Necrotic Quality: Adherent Slough Tendon Exposed: No Muscle Exposed: No Joint Exposed: No Bone Exposed: No Treatment Notes Wound #5 (Lower Leg) Wound Laterality: Left, Medial Cleanser Byram Ancillary Kit - 15 Day Supply Discharge Instruction: Use supplies as instructed; Kit contains: (15) Saline Bullets; (15) 3x3 Gauze; 15 pr Gloves Soap and Water Discharge Instruction: Gently cleanse wound with antibacterial soap, rinse and pat dry prior to dressing wounds Vashe 5.8 (oz) Discharge Instruction: Use vashe 5.8 (oz) as directed Peri-Wound Care AandD Ointment Discharge Instruction: Apply AandD Ointment as directed around edges to help dressing from sticking Topical Primary Dressing Hydrofera Blue Ready Transfer Foam, 2.5x2.5 (in/in) Discharge Instruction: Apply Hydrofera Blue Ready to wound bed as directed cut to fit wound bed Secondary  Dressing (BORDER) Zetuvit Plus SILICONE BORDER Dressing 4x4 (in/in) Discharge Instruction: Please do not put silicone bordered dressings under wraps. Use non-bordered dressing only. Secured With Compression Wrap Compression Stockings Facilities manager) Signed: 05/01/2023 5:04:52 PM By: [name] Signed: 05/03/2023 8:18:57 AM By: Yevonne Pax RN Entered By: [name] on 05/01/2023 16:17:39 Seymore, [name] ([id number]) [id number] Page 9 of 9 -------------------------------------------------------------------------------- Vitals Details Patient Name: Date of Service: [name], [name] [place] J. 05/01/2023 3:30 PM Medical Record Number: [id number] Patient Account Number: [id number] Date of Birth/Sex: Treating RN: [birth year] (38 y.o. Freddy Finner Primary Care Brandun Pinn: [name] Other Clinician: [name] Referring Emersyn Kotarski: Treating Shawndra Clute/Extender: Jaclynn Major in Treatment: 41 Vital Signs Time Taken: 16:10 Temperature (F): 98.3 Weight (lbs): 400 Pulse (bpm): 103 Respiratory Rate (breaths/min): 18 Blood Pressure (mmHg): 146/81 Reference Range: 80 - 120 mg / dl Electronic Signature(s) Signed: 05/01/2023 5:04:52 PM By: [name] Entered By: [name] on 05/01/2023 16:12:47

## 2023-05-02 NOTE — Progress Notes (Signed)
[name], [name] ([id number]) [id number] Page 1 of 12 Visit Report for 05/01/2023 Chief Complaint Document Details Patient Name: Date of Service: [name], [name] [place]. 05/01/2023 3:30 PM Medical Record Number: [id number] Patient Account Number: [id number] Date of Birth/Sex: Treating RN: 01/27/1986 (38 y.o. Freddy Finner Primary Care Provider: [name] Other Clinician: [name] Referring Provider: Treating Provider/Extender: Jaclynn Major in Treatment: 72 Information Obtained from: Patient Chief Complaint 04/18/2022; Left LE Ulcer Electronic Signature(s) Signed: 05/01/2023 3:59:08 PM By: Allen Derry PA-C Entered By: Allen Derry on 05/01/2023 15:59:08 -------------------------------------------------------------------------------- HPI Details Patient Name: Date of Service: [name]. 05/01/2023 3:30 PM Medical Record Number: [id number] Patient Account Number: [id number] Date of Birth/Sex: Treating RN: [birth year] (37 y.o. Freddy Finner Primary Care Provider: [name] Other Clinician: [name] Referring Provider: Treating Provider/Extender: Jaclynn Major in Treatment: 79 History of Present Illness HPI Description: 38 year old patient who started with having ulcerations on the right lower leg on the lateral part of her ankle for about 2 weeks. She was seen in the ER at [facility] and advised to see the wound care for a consultation. No X-rays of workup was done during the ER visit and no prescription for any medications of compression wraps were given. the patient is not diabetic but does have hypertension and her medications have been reviewed by me. 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 The right and left greater  saphenous vein was found to be tortuous. Deep venous system was also not competent and there was reflux of more than 500 ms. She was then seen by Dr. Karie Schwalbe Early who recommended that the patient would not benefit from endovenous ablation and he had recommended vein stripping odd on the right side and multiple small phlebectomy procedures on the left side. the patient did not follow-up due to social economic reasons. She has not been wearing any compression stockings and has not taken any specific treatment for varicose veins for the last 3 years. 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 We have obtained some appointments to see the vascular surgeons by the end of the month and the patient would like to follow up with me at my [place] on Wednesday, June 29. 10/14/2014 -- she could not see me yesterday in [place] and hence has come for a review today. She has a vascular workup to be done this afternoon at [facility] ([id number]) [phone].pdf Page 2 of [address] She is doing fine otherwise. 10/22/2014 -- she was seen by Dr. Brantley Fling and he has recommended surgical removal of her right saphenous vein from distal thigh to saphenofemoral odd junction and stab phlebectomy's of multiple large tributary branches throughout her thigh and calf. This would be done under general anesthesia in the outpatient setting. 10/29/2014 -- she is trying to work on a surgical date and in the meanwhile we have got insurance clearance for Apligraf and we will start this next week. 7/22 2016 -- she is here for the first application of Apligraf. 11/19/2014 -- she is here for a second application of Apligraf 11/26/2014 -- she has done fine after her last application of Apligraf and is awaiting her surgery which is  scheduled for August 31. 12/03/2014 -- she is doing fine and is  here for her third application of Apligraf. 12/21/2014 -- She had surgery on 12/15/2014 by Dr.Early who did #1 ligation and stripping of right great saphenous vein from distal thigh to saphenofemoral junction, #2 stab phlebectomy of large tributary varicose veins in the thigh popliteal space and calf. She had an Ace wrap up to her groin and this was removed today and the Unna's boot was also removed. 12/28/2014 -- she is here for her fourth application of Apligraf. 01/06/2015 - he saw her vascular surgeon Dr. Arbie Cookey who was pleased with her progress and he has confirmed that no surgical procedures could be attempted on the left side. 01/13/2015 -- her wound looks very good and she's been having no problems whatsoever. Readmission: 07/26/2020 upon evaluation today patient presents for initial inspection here in our clinic for a new issue with her left leg although she is previously been seen due to issues with the right leg back in 2016. At that time she was seeing Dr. Arbie Cookey who is a vein/vascular specialist in [place]. He has since semiretired from what I understand. He is working out of [employer] I believe. Nonetheless she tells me at the time that there was really nothing to do for her left leg although the right leg was where they did most of the work. Subsequently she states that she is done fairly well until just in the past week where she had issues with bleeding from what appears to be varicose vein on the left leg medially. Unfortunately this has continued to be an issue although she tells me at first it was coming much more significantly Down quite a bit but nonetheless has not completely resolved. Every time she showers she notices that it starts to drain a little bit more. She does have a history of chronic venous insufficiency, lymphedema, varicose veins bilaterally, and obesity. 08/02/2020 upon evaluation today patient appears to be doing about the same in regards to the ulcer on her  left leg. She has some eschar covering there is definitely some fluid collecting underneath unfortunately. With that being said she tells me she is still having a tremendous amount of pain therefore she is really not able to allow me to clean this off very effectively to be perfectly honest. I think we need to try to soften this up 08/16/2020 upon evaluation today patient's wound is really not doing significantly better not really states about the same. She notes that the wrap just does not seem to be staying up very well at all unfortunately. No fevers, chills, nausea, vomiting, or diarrhea. She did cut it off once it starts to slide in order to alleviate some of the pressure from sliding Down. Fortunately there is no signs of active infection at this time which is great news. 08/23/2020 upon evaluation today patient appears to be doing well 08/23/2020 upon evaluation today patient appears to be doing well with regard to her wound all things considered. Fortunately there does not appear to be any signs of active infection at this time which is great news. She has been tolerating the dressing changes without complication and overall I am extremely pleased with where things stand at this point. She does have her appointment with vascular in [facility] on June 9. 08/30/2020 upon evaluation today patient actually appears to be doing decently well in regard to her wound. Fortunately there is no signs of active infection which is great news. Nonetheless I  do believe that the patient is going require little bit of debridement if she is okay with me attempting that today I think that will help clean off some of the necrotic tissue. Fortunately there does not appear to be otherwise any evidence of active infection which is also great news. 09/19/2020 upon evaluation today patient appears to be doing a little better in regard to her wound as compared to previous. Fortunately there does not appear to be any signs of  active infection overall. No fever chills noted. I do believe that the Iodosorb has made this a little bit better with regard to the overall size and appearance of the wound bed though again she does still have quite a ways to go to get this to heal she still very tender to touch. 09/27/2020 upon evaluation today patient appears to actually be doing quite well with regard to her wound. This is measuring much smaller which is great news. With that being said she did see vein and vascular in [facility] and they subsequently recommended that surgery is really what she probably needs to go forward with sounds like the potential for venous ablation. With that being said the patient tells me this is just not the right time for her to be able to proceed with any type of surgery which I completely understand. Nonetheless I do believe that she would continue to benefit from compression but again that is really not something that she is able to easily do. 10/04/2020 upon evaluation today patient appears to be doing about the same in regard to her wound. This is measuring a little bit smaller but nonetheless still is open and again has some slough and biofilm noted on the surface of the wound. There does not appear to be any signs of active infection which is great news. No fevers, chills, nausea, vomiting, or diarrhea. 10/04/2020 upon evaluation today patient appears to be doing well with regard to her wound. She has been tolerating dressing changes without complication. Fortunately there does not appear to be any signs of active infection which is great news. No fevers, chills, nausea, vomiting, or diarrhea. 10/25/2020 upon evaluation today patient appears to be doing well with regard to her wound. She has been tolerating the dressing changes without complication. Fortunately there is no signs of active infection at this time. No fevers, chills, nausea, vomiting, or diarrhea. 11/01/2020 upon evaluation today patient  with regard to her wound. She has been tolerating the dressing changes without complication. Fortunately there does not appear to be any signs of infection which is great news. No fever chills noted 11/15/2020 upon evaluation today patient appears to be doing well with regard to her wound. Fortunately there is no signs of active infection at this time. No fevers, chills, nausea, vomiting, or diarrhea. With that being said she continues to have a significant amount of pain at the site even though this is very close to complete closure. She also had several varicose veins around the area which were also problematic. Overall however I feel like the patient is making excellent progress. 11/28/2020 upon evaluation today patient appears to be doing well with regard to her leg ulcer. Again were not really able to debride or compression wrap her due to discomfort and pain. She does not allow for that. With that being said we have been using Iodosorb which does seem to be doing decently well. Fortunately there is no signs of active infection at this time which is great news. No  fevers, chills, nausea, vomiting, or diarrhea. 8/31; patient presents for 2-week follow-up. She has been using Iodosorb. She reports that the wound is closed. She denies signs of infection. Readmission: 10-27-2021 upon evaluation this is a patient that presents today whom I have previously seen this is pretty much for the same issue though I think a little bit higher than the last time I saw her. She does have a history of chronic venous insufficiency and hypertension along with varicose veins. Subsequently she does have an ulceration which spontaneously ruptured she has not been wearing any compression which I think is a big part of the issue here. We discussed this before I really think she probably needs to be wearing her compression therapy, she probably needs lymphedema pumps if she can ever wear the compression for a significant  amount of time to get these, and subsequently also think that she needs to be elevating her legs is much as possible she may even need some vascular intervention in regard to her veins. All of this was reiterated and discussed with her today to reinforce what needs to happen in order to ensure that her legs do not get a lot worse. The patient voiced understanding. She tells me that she knows because she is seeing her mom go through a lot of this as well how bad things can get. [name], [name] ([id number]) [id number] Page 3 of 12 11-03-2021 upon evaluation today patient appears to be doing well with regard to her wound. Fortunately there does not appear to be any signs of active infection at this time. She is measuring a little bit bigger but I think this is because the wound is actually cleaning up a bit here. 7/27; left lateral leg wound not any smaller but perhaps with a cleaner surface. She is using Iodoflex to help with the latter and using Tubigrip. She has chronic venous insufficiency with secondary lymphedema. She is apparently followed by vein and vascular and is being scheduled for an ablation 11-17-2021 upon evaluation today patient appears to be doing well with regard to her wound this is actually showing signs of improvement which is great news. Fortunately I do not see any evidence of active infection locally or systemically at this time which is great news. No fevers, chills, nausea, vomiting, or diarrhea. 11-24-2021 upon evaluation today patient's wound is actually showing signs of significant improvement. Unfortunately she had quite a bit of pain with debridement last week I do believe it was beneficial but at the same time she is doing much better but still really does not want this debrided again I think being that it is looking a whole lot better I would try to avoid that today especially since it caused her so much discomfort that is really not the goal and  I explained that to the patient today she voiced understanding and knows that it needed to be done but still states that it was quite painful. 11-30-2021 upon evaluation today patient appears to be doing excellent in regard to her wound this is actually showing signs of excellent improvement I am very pleased with where things stand. She does have her venous ablation appointment for October 17. 12-21-2021 upon evaluation today patient appears to be doing better in regard to her wound this is measuring smaller and looking better as well. Fortunately I do not see any signs of active infection locally or systemically which is great news. 01-01-2022 upon evaluation today patient appears to be doing well currently in  regard to her wound. She is showing signs of improvement which is great news and overall I do not see any signs of active infection locally or systemically at this time. 01-08-2022 upon evaluation today patient appears to be doing well currently in regard to her wound she is actually showing signs of significant improvement which is great news. Fortunately I do not see any evidence of active infection locally or systemically at this time. I do believe that we are on the right track. She also has her appointment October 19 for the venous ablation. 01-16-2022 upon evaluation today patient's wound actually is showing some signs of improvement although this is very slow. Fortunately I do not see any evidence of infection at this time. The volume is a little bit more although the size is smaller I think this is due to the fact that we are slowly cleaning this area out effectively. 11/6 continued improvement the patient is using Iodoflex and a Tubigrip E. She was supposed to have venous surgery at vein and vascular in [place] however somehow this is gotten delayed till December 14. 02-27-2022 upon evaluation today patient's wound is actually showing signs of being completely healed. Fortunately I do  not see any evidence of active infection locally or systemically which is great news and overall I am extremely pleased with where we stand today. Unfortunately she does tell me that she postpone her venous ablation surgery until December 14 she tells me that she was not ready "financially" for this. 04/18/2022; [name] is a 38 year old female with a past medical history of venous insufficiency that presents the clinic for a left lower extremity wound. She was seen almost 2 months ago for the same wound. This had healed with Iodoflex and Tubigrip. She states that the wound recently reopened. She has seen vein and vascular and plan is for ligation and stripping of the left great saphenous vein. She is not sure when this procedure is going to be scheduled. She has canceled it once before. She has had office compression wraps in the past however these do not stay on and create more of an issue for her. She would like to avoid this. She currently denies signs of infection. 1/10; patient presents for follow-up. She has been using Iodoflex to the wound bed. She states she has been using her Tubigrip however she does not have this on today. She has no issues or complaints today. She denies signs of infection. 1/17; patient presents for follow-up. She has not been using Iodoflex to the wound bed. She reports acute pain. She denies increased warmth, erythema or purulent drainage to the left lower extremity. She states she has been using Tubigrip. She has information to order the compression stockings but has not obtained them. 1/24; Patient had a wound culture done at last clinic visit that grew Proteus mirabilis and E. coli. She was prescribed levofloxacin and this should cover both bacteria. She has been taking the medication over the past week with improvement of symptoms to the wound bed. She has been using Hydrofera Blue under Tubigrip. She states the [facility] is sticking to her wound  bed. 1/31; patient presents for follow-up. She has been using Medihoney to the wound bed with improvement in healing. She has been contacted by [facility] to order her antibiotic ointment. She is not sure yet if she is able to afford this. She received her compression stockings but states they did not fit well. She is working on returning these.  She has been using Tubigrip. 2/7; patient presents for follow-up. She is been using Medihoney to the wound bed. She obtained her compression stockings and has been using these daily. She states that the [facility] antibiotic ointment is arriving in the mail tomorrow. She has no issues or complaints today. She denies systemic signs of infection. 2/14; patient presents for follow-up. She has been using Keystone antibiotic ointment to the wound bed. She states the [facility] is sticking and she has stopped this. She is been using her compression stockings daily. She does not have these on today. 2/28; patient presents for follow-up. She continues to use [facility] antibiotic ointment to the wound bed. There is been improvement in healing. She has been using her compression stockings daily. 3/12; patient presents for follow-up. She has been using Keystone antibiotic ointment to the wound bed. She is not wearing her compression stockings today but states she does wear them daily. She declines debridement today. 3/20; patient presents for follow-up. She has been using Keystone antibiotic ointment with Hydrofera Blue. She states she is wearing her compression stockings daily but she does not have them on today. 3/27; patient presents for follow-up. She has been using Keystone antibiotic ointment and hydrofera blue to the wound bed. She reports wearing compression stockings. 4/10; patient presents for follow-up. She has been using Keystone antibiotic ointment and Hydrofera Blue to the wound bed. She reports wearing compression stockings. She has no issues or  complaints today. 4/24; patient presents for follow-up. She has been using Keystone antibiotic ointment and Hydrofera Blue to the wound bed. Overall there is healthier tissue today. She reports wearing her compression stockings daily. 5/8; patient comes into clinic today with outer stocking on the left leg and with no Keystone. She has small wounds on the medial left lower leg and has been using Keystone and [employer]	[place] at home. 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 She has chronic venous hypertension and stage III lymphedema. She came in today saying she did not want debridement and really was not interested in compression wraps saying they fall down consistently. She does not have compression pumps 6/20; patient has missed her last 3 follow-up appointments. She has been using Keystone antibiotic ointment and Hydrofera Blue. We discussed potentially doing an in office wrap and patient declined Today. She states she will try the in office wrap next week. She denies signs of infection. [name], [name] ([id number]) [id number] Page 4 of 12 7/3; patient presents for follow-up. She missed her last clinic appointment. She declines doing the in office wrap today. She has been using Hydrofera Blue with antibiotic ointment. 8/14; patient has not been here since the beginning of July. She has wounds secondary to chronic venous insufficiency and lymphedema on the left medial lower leg. She has supposedly been using [facility]. She is covering this with some form of foam dressing. She is using compression stockings from elastic therapy which are probably 20/30 mmHg although we just do not seem to be able to identify that. She tells me she puts those on during the day and leaves them on all night and only changing them once a day. 9/4; patient presents for follow-up. She has been using Hydrofera Blue to the wound bed. The wound is smaller. She has  been wearing her compression stockings daily. 01-14-2023 upon evaluation today patient appears to be doing well currently in regard to her wounds. She is doing very similar to  when I last saw her. Fortunately I do not think that there is any signs of significant infection unfortunately I do believe that she is continuing to have some issues here with the open wounds and she should be seeing vascular shortly to look back into the venous ablation side of things. 01-21-2023 upon evaluation today patient appears to be doing well currently in regard to her wound. There are some slough and biofilm noted on the surface of the wound. With that being said she does not have any signs of infection at this point which is good news. No fevers, chills, nausea, vomiting, or diarrhea. 02-14-2023 upon evaluation today patient's wound appears to be doing about the same. She still will not allow for any debridement she states it hurts too bad she does have a venous ablation is being worked up again she was post to have it previous but she did not end up going through with that and then the approval timed on that they are having to review all the evaluation and approval process at this point. She does have an appointment in 2 weeks with the vascular surgeon to go over the results of her most recent venous study. 03-25-2023 upon evaluation today patient appears to be doing well currently in regard to her leg overall although she is having some increased pain this may be secondary to have an infection. Fortunately I do not see any signs of active infection systemically but locally it feels like that may be what is going on here based on what I am experiencing from the patient. 04-08-2023 upon evaluation today patient appears to be doing well currently in regard to her wound. This is actually showing signs of significant improvement and actually very pleased with where we stand today. I do not see any signs of active infection  at this time. No fevers, chills, nausea, vomiting, or diarrhea. 05-01-23 upon evaluation today patient appears to be doing well currently in regard to her wound all things considered. Her compression she is wearing her compression socks every day but she will need something that would provide more compression at the site of the wound. I do not see any evidence of active infection at this time which is great news. Electronic Signature(s) Signed: 05/01/2023 5:12:28 PM By: Allen Derry PA-C Entered By: Allen Derry on 05/01/2023 17:12:28 -------------------------------------------------------------------------------- Physical Exam Details Patient Name: Date of Service: [name], BERBERICK MA [name]. 05/01/2023 3:30 PM Medical Record Number: [id number] Patient Account Number: [id number] Date of Birth/Sex: Treating RN: 09/18/1985 (37 y.o. Freddy Finner Primary Care Provider: [name] Other Clinician: [name] Referring Provider: Treating Provider/Extender: Jaclynn Major in Treatment: 6 Constitutional Obese and well-hydrated in no acute distress. Respiratory normal breathing without difficulty. Psychiatric this patient is able to make decisions and demonstrates good insight into disease process. Alert and Oriented x 3. pleasant and cooperative. Notes Upon inspection patient's wound bed actually showed signs of good granulation and epithelization at this point. Fortunately I do not see any signs of infection she absolutely does not want me to perform any debridement today therefore that was not done. I do think that the wound is very small but nonetheless still open and again were not really did not need compression unfortunately. Electronic Signature(s) Signed: 05/01/2023 5:13:40 PM By: Allen Derry PA-C Entered By: Allen Derry on 05/01/2023 17:13:40 Shinsky, [name] ([id number]) [id number] Page 5 of  12 -------------------------------------------------------------------------------- Physician Orders Details Patient Name: Date of Service: [name], [name] [name]. 05/01/2023  3:30 PM Medical Record Number: [id number] Patient Account Number: [id number] Date of Birth/Sex: Treating RN: [birth year] (38 y.o. 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 Bathing/ [employer] wounds with antibacterial soap and water. May shower with wound dressing protected with water repellent cover or cast protector. No tub bath. Anesthetic (Use 'Patient Medications' Section for Anesthetic Order Entry) Lidocaine applied to wound bed Wound Treatment Wound #5 - Lower Leg Wound Laterality: Left, Medial Cleanser: Byram Ancillary Kit - 15 Day Supply (Generic) 3 x Per Week/30 Days Discharge Instructions: Use supplies as instructed; Kit contains: (15) Saline Bullets; (15) 3x3 Gauze; 15 pr Gloves Cleanser: Soap and Water 3 x Per Week/30 Days Discharge Instructions: Gently cleanse wound with antibacterial soap, rinse and pat dry prior to dressing wounds Cleanser: Vashe 5.8 (oz) 3 x Per Week/30 Days Discharge Instructions: Use vashe 5.8 (oz) as directed Peri-Wound Care: AandD Ointment 3 x Per Week/30 Days Discharge Instructions: Apply AandD Ointment as directed around edges to help dressing from  sticking Prim Dressing: Hydrofera Blue Ready Transfer Foam, 2.5x2.5 (in/in) (DME) (Dispense As Written) 3 x Per Week/30 Days ary Discharge Instructions: Apply Hydrofera Blue Ready to wound bed as directed cut to fit wound bed Secondary Dressing: (BORDER) Zetuvit Plus SILICONE BORDER Dressing 4x4 (in/in) (DME) (Dispense As Written) 3 x Per Week/30 Days Discharge Instructions: Please do not put silicone bordered dressings under wraps. Use non-bordered dressing only. Custom Services custom Medi compression garments - Patient needs lifetime use of compression garments for bilat lower legs - (ICD10 I87.312 - Chronic venous hypertension (idiopathic) with ulcer of left lower extremity) Electronic Signature(s) Signed: 05/01/2023 4:50:15 PM By: Lowella Dandy, [place] [name] ([id number]) [id number] Page 6 of 12 Entered By: Allen Derry on 05/01/2023 16:50:15 Prescription 05/01/2023 -------------------------------------------------------------------------------- Zannie Kehr PA-C Patient Name: Provider: 18-Jan-1986 [id number] Date of Birth: NPI#: [id number] Sex: DEA #: [phone] Phone #: License #: UPN: Patient Address: [address] [facility] [address] [facility] [place], [place] [address] [address], [address] [place], [place] [address] [phone] Allergies tramadol Provider's Orders custom Medi compression garments - ICD10: I87.312 - Patient needs lifetime use of compression garments for bilat lower legs Hand Signature: Date(s): Electronic Signature(s) Signed: 05/01/2023 4:50:31 PM By: Allen Derry PA-C Entered By: Allen Derry on 05/01/2023 16:50:15 -------------------------------------------------------------------------------- Problem List Details Patient Name: Date of Service: [name]. 05/01/2023 3:30 PM Medical Record Number: [id number] Patient  Account Number: [id number] Date of Birth/Sex: Treating RN: Jan 14, 1986 (37 y.o. 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 05/01/2023 3:30 PM Medical Record Number: [id number] Patient Account Number: [id number] Date of Birth/Sex: Treating RN: Jun 12, 1985 (37 y.o. Freddy Finner Primary Care Provider: [name] Other Clinician: [name] Referring Provider: Treating Provider/Extender: Jaclynn Major in Treatment: 58 Subjective Chief Complaint Information obtained from Patient 04/18/2022; Left LE Ulcer History of Present Illness (HPI) 38 year old patient who started with having ulcerations on the right lower leg on the lateral part of her ankle for about 2 weeks. She was seen in the ER at [facility] and advised to see the wound care for a consultation. No X-rays of workup was done during the ER visit and no prescription for any medications of compression wraps were given. the patient is not diabetic but does have hypertension and her medications have been reviewed by me. 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 The right and left greater saphenous vein was found to be tortuous. Deep venous system was also not competent and there was reflux of more than 500 ms. She was then seen by Dr. Karie Schwalbe Early who recommended that the patient would not benefit from endovenous ablation and he had recommended vein stripping odd on the right side and multiple small phlebectomy procedures on the left side. the patient did not follow-up due to social economic reasons. She has not been wearing any compression stockings and has not taken any specific treatment for varicose veins for the last 3 years. 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 We have obtained some appointments to see the vascular surgeons by the end of the month and the patient would like to follow up with me at my [facility] on Wednesday, June 29. 10/14/2014 -- she could not see me yesterday in [place] and hence has come for a review today. She has a vascular workup to be done this afternoon at [facility]. She is doing fine otherwise. 10/22/2014 -- she was seen by Dr. Brantley Fling and he has recommended surgical removal of her right saphenous vein from distal thigh to saphenofemoral odd junction and stab phlebectomy's of multiple large tributary branches throughout her thigh and calf. This would be done under general anesthesia in the outpatient setting. 10/29/2014 -- she is trying to work on a surgical date and in the meanwhile we have got insurance clearance for Apligraf and we will start this next week. 7/22 2016 -- she is here for the first application of Apligraf. 11/19/2014 -- she is here for a second application of Apligraf 11/26/2014 -- she has done fine  after her last application of Apligraf and is awaiting her surgery which is scheduled for August 31. 12/03/2014 -- she is doing fine and is here for her third application of Apligraf. 12/21/2014 -- She had surgery on 12/15/2014 by Dr.Early who did #1 ligation and stripping of right great saphenous vein from distal thigh to saphenofemoral junction, #2 stab phlebectomy of large tributary varicose veins in the thigh popliteal space and calf. She had an Ace wrap up  to her groin and this was removed today and the Unna's boot was also removed. 12/28/2014 -- she is here for her fourth application of Apligraf. [name], [name] ([id number]) [id number] Page 8 of 12 01/06/2015 - he saw her vascular surgeon Dr. Arbie Cookey who was pleased with her progress and he has confirmed that no surgical procedures could be attempted on the left side. 01/13/2015 -- her wound looks very good and she's been having no problems whatsoever. Readmission: 07/26/2020 upon evaluation today patient presents for initial inspection here in our clinic for a new issue with her left leg although she is previously been seen due to issues with the right leg back in 2016. At that time she was seeing Dr. Arbie Cookey who is a vein/vascular specialist in [place]. He has since semiretired from what I understand. He is working out of [employer] I believe. Nonetheless she tells me at the time that there was really nothing to do for her left leg although the right leg was where they did most of the work. Subsequently she states that she is done fairly well until just in the past week where she had issues with bleeding from what appears to be varicose vein on the left leg medially. Unfortunately this has continued to be an issue although she tells me at first it was coming much more significantly Down quite a bit but nonetheless has not completely resolved. Every time she showers she notices that it starts to drain a little bit  more. She does have a history of chronic venous insufficiency, lymphedema, varicose veins bilaterally, and obesity. 08/02/2020 upon evaluation today patient appears to be doing about the same in regards to the ulcer on her left leg. She has some eschar covering there is definitely some fluid collecting underneath unfortunately. With that being said she tells me she is still having a tremendous amount of pain therefore she is really not able to allow me to clean this off very effectively to be perfectly honest. I think we need to try to soften this up 08/16/2020 upon evaluation today patient's wound is really not doing significantly better not really states about the same. She notes that the wrap just does not seem to be staying up very well at all unfortunately. No fevers, chills, nausea, vomiting, or diarrhea. She did cut it off once it starts to slide in order to alleviate some of the pressure from sliding Down. Fortunately there is no signs of active infection at this time which is great news. 08/23/2020 upon evaluation today patient appears to be doing well 08/23/2020 upon evaluation today patient appears to be doing well with regard to her wound all things considered. Fortunately there does not appear to be any signs of active infection at this time which is great news. She has been tolerating the dressing changes without complication and overall I am extremely pleased with where things stand at this point. She does have her appointment with vascular in [facility] on June 9. 08/30/2020 upon evaluation today patient actually appears to be doing decently well in regard to her wound. Fortunately there is no signs of active infection which is great news. Nonetheless I do believe that the patient is going require little bit of debridement if she is okay with me attempting that today I think that will help clean off some of the necrotic tissue. Fortunately there does not appear to be otherwise any evidence of  active infection which is also great news. 09/19/2020 upon evaluation today patient appears  to be doing a little better in regard to her wound as compared to previous. Fortunately there does not appear to be any signs of active infection overall. No fever chills noted. I do believe that the Iodosorb has made this a little bit better with regard to the overall size and appearance of the wound bed though again she does still have quite a ways to go to get this to heal she still very tender to touch. 09/27/2020 upon evaluation today patient appears to actually be doing quite well with regard to her wound. This is measuring much smaller which is great news. With that being said she did see vein and vascular in [facility] and they subsequently recommended that surgery is really what she probably needs to go forward with sounds like the potential for venous ablation. With that being said the patient tells me this is just not the right time for her to be able to proceed with any type of surgery which I completely understand. Nonetheless I do believe that she would continue to benefit from compression but again that is really not something that she is able to easily do. 10/04/2020 upon evaluation today patient appears to be doing about the same in regard to her wound. This is measuring a little bit smaller but nonetheless still is open and again has some slough and biofilm noted on the surface of the wound. There does not appear to be any signs of active infection which is great news. No fevers, chills, nausea, vomiting, or diarrhea. 10/04/2020 upon evaluation today patient appears to be doing well with regard to her wound. She has been tolerating dressing changes without complication. Fortunately there does not appear to be any signs of active infection which is great news. No fevers, chills, nausea, vomiting, or diarrhea. 10/25/2020 upon evaluation today patient appears to be doing well with regard to her wound.  She has been tolerating the dressing changes without complication. Fortunately there is no signs of active infection at this time. No fevers, chills, nausea, vomiting, or diarrhea. 11/01/2020 upon evaluation today patient with regard to her wound. She has been tolerating the dressing changes without complication. Fortunately there does not appear to be any signs of infection which is great news. No fever chills noted 11/15/2020 upon evaluation today patient appears to be doing well with regard to her wound. Fortunately there is no signs of active infection at this time. No fevers, chills, nausea, vomiting, or diarrhea. With that being said she continues to have a significant amount of pain at the site even though this is very close to complete closure. She also had several varicose veins around the area which were also problematic. Overall however I feel like the patient is making excellent progress. 11/28/2020 upon evaluation today patient appears to be doing well with regard to her leg ulcer. Again were not really able to debride or compression wrap her due to discomfort and pain. She does not allow for that. With that being said we have been using Iodosorb which does seem to be doing decently well. Fortunately there is no signs of active infection at this time which is great news. No fevers, chills, nausea, vomiting, or diarrhea. 8/31; patient presents for 2-week follow-up. She has been using Iodosorb. She reports that the wound is closed. She denies signs of infection. Readmission: 10-27-2021 upon evaluation this is a patient that presents today whom I have previously seen this is pretty much for the same issue though I think a little  bit higher than the last time I saw her. She does have a history of chronic venous insufficiency and hypertension along with varicose veins. Subsequently she does have an ulceration which spontaneously ruptured she has not been wearing any compression which I think is a  big part of the issue here. We discussed this before I really think she probably needs to be wearing her compression therapy, she probably needs lymphedema pumps if she can ever wear the compression for a significant amount of time to get these, and subsequently also think that she needs to be elevating her legs is much as possible she may even need some vascular intervention in regard to her veins. All of this was reiterated and discussed with her today to reinforce what needs to happen in order to ensure that her legs do not get a lot worse. The patient voiced understanding. She tells me that she knows because she is seeing her mom go through a lot of this as well how bad things can get. 11-03-2021 upon evaluation today patient appears to be doing well with regard to her wound. Fortunately there does not appear to be any signs of active infection at this time. She is measuring a little bit bigger but I think this is because the wound is actually cleaning up a bit here. 7/27; left lateral leg wound not any smaller but perhaps with a cleaner surface. She is using Iodoflex to help with the latter and using Tubigrip. She has chronic venous insufficiency with secondary lymphedema. She is apparently followed by vein and vascular and is being scheduled for an ablation 11-17-2021 upon evaluation today patient appears to be doing well with regard to her wound this is actually showing signs of improvement which is great news. Fortunately I do not see any evidence of active infection locally or systemically at this time which is great news. No fevers, chills, nausea, vomiting, or diarrhea. 11-24-2021 upon evaluation today patient's wound is actually showing signs of significant improvement. Unfortunately she had quite a bit of pain with debridement last week I do believe it was beneficial but at the same time she is doing much better but still really does not want this debrided again I think being that it is  looking a whole lot better I would try to avoid that today especially since it caused her so much discomfort that is really not the goal and I explained that to the patient today she voiced understanding and knows that it needed to be done but still states that it was quite painful. [name], [name] ([id number]) [id number] Page 9 of 12 11-30-2021 upon evaluation today patient appears to be doing excellent in regard to her wound this is actually showing signs of excellent improvement I am very pleased with where things stand. She does have her venous ablation appointment for October 17. 12-21-2021 upon evaluation today patient appears to be doing better in regard to her wound this is measuring smaller and looking better as well. Fortunately I do not see any signs of active infection locally or systemically which is great news. 01-01-2022 upon evaluation today patient appears to be doing well currently in regard to her wound. She is showing signs of improvement which is great news and overall I do not see any signs of active infection locally or systemically at this time. 01-08-2022 upon evaluation today patient appears to be doing well currently in regard to her wound she is actually showing signs of significant improvement which is great  news. Fortunately I do not see any evidence of active infection locally or systemically at this time. I do believe that we are on the right track. She also has her appointment October 19 for the venous ablation. 01-16-2022 upon evaluation today patient's wound actually is showing some signs of improvement although this is very slow. Fortunately I do not see any evidence of infection at this time. The volume is a little bit more although the size is smaller I think this is due to the fact that we are slowly cleaning this area out effectively. 11/6 continued improvement the patient is using Iodoflex and a Tubigrip E. She was supposed to have venous  surgery at vein and vascular in [place] however somehow this is gotten delayed till December 14. 02-27-2022 upon evaluation today patient's wound is actually showing signs of being completely healed. Fortunately I do not see any evidence of active infection locally or systemically which is great news and overall I am extremely pleased with where we stand today. Unfortunately she does tell me that she postpone her venous ablation surgery until December 14 she tells me that she was not ready "financially" for this. 04/18/2022; Ms. Jobi Brazel is a 38 year old female with a past medical history of venous insufficiency that presents the clinic for a left lower extremity wound. She was seen almost 2 months ago for the same wound. This had healed with Iodoflex and Tubigrip. She states that the wound recently reopened. She has seen vein and vascular and plan is for ligation and stripping of the left great saphenous vein. She is not sure when this procedure is going to be scheduled. She has canceled it once before. She has had office compression wraps in the past however these do not stay on and create more of an issue for her. She would like to avoid this. She currently denies signs of infection. 1/10; patient presents for follow-up. She has been using Iodoflex to the wound bed. She states she has been using her Tubigrip however she does not have this on today. She has no issues or complaints today. She denies signs of infection. 1/17; patient presents for follow-up. She has not been using Iodoflex to the wound bed. She reports acute pain. She denies increased warmth, erythema or purulent drainage to the left lower extremity. She states she has been using Tubigrip. She has information to order the compression stockings but has not obtained them. 1/24; Patient had a wound culture done at last clinic visit that grew Proteus mirabilis and E. coli. She was prescribed levofloxacin and this should cover  both bacteria. She has been taking the medication over the past week with improvement of symptoms to the wound bed. She has been using Hydrofera Blue under Tubigrip. She states the [facility] is sticking to her wound bed. 1/31; patient presents for follow-up. She has been using Medihoney to the wound bed with improvement in healing. She has been contacted by [facility] to order her antibiotic ointment. She is not sure yet if she is able to afford this. She received her compression stockings but states they did not fit well. She is working on returning these. She has been using Tubigrip. 2/7; patient presents for follow-up. She is been using Medihoney to the wound bed. She obtained her compression stockings and has been using these daily. She states that the [facility] antibiotic ointment is arriving in the mail tomorrow. She has no issues or complaints today. She denies systemic signs of infection. 2/14; patient  presents for follow-up. She has been using [facility] antibiotic ointment to the wound bed. She states the [facility] is sticking and she has stopped this. She is been using her compression stockings daily. She does not have these on today. 2/28; patient presents for follow-up. She continues to use [facility] antibiotic ointment to the wound bed. There is been improvement in healing. She has been using her compression stockings daily. 3/12; patient presents for follow-up. She has been using [facility]stone antibiotic ointment to the wound bed. She is not wearing her compression stockings today but states she does wear them daily. She declines debridement today. 3/20; patient presents for follow-up. She has been using Keystone antibiotic ointment with Hydrofera Blue. She states she is wearing her compression stockings daily but she does not have them on today. 3/27; patient presents for follow-up. She has been using Keystone antibiotic ointment and hydrofera blue to the wound bed. She reports  wearing compression stockings. 4/10; patient presents for follow-up. She has been using Keystone antibiotic ointment and Hydrofera Blue to the wound bed. She reports wearing compression stockings. She has no issues or complaints today. 4/24; patient presents for follow-up. She has been using Keystone antibiotic ointment and Hydrofera Blue to the wound bed. Overall there is healthier tissue today. She reports wearing her compression stockings daily. 5/8; patient comes into clinic today with outer stocking on the left leg and with no Keystone. She has small wounds on the medial left lower leg and has been using Keystone and [employer]	[place] at home. 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 She has chronic venous hypertension and stage III lymphedema. She came in today saying she did not want debridement and really was not interested in compression wraps saying they fall down consistently. She does not have compression pumps 6/20; patient has missed her last 3 follow-up appointments. She has been using Keystone antibiotic ointment and Hydrofera Blue. We discussed potentially doing an in office wrap and patient declined Today. She states she will try the in office wrap next week. She denies signs of infection. 7/3; patient presents for follow-up. She missed her last clinic appointment. She declines doing the in office wrap today. She has been using Hydrofera Blue with antibiotic ointment. 8/14; patient has not been here since the beginning of July. She has wounds secondary to chronic venous insufficiency and lymphedema on the left medial lower leg. She has supposedly been using [facility]. She is covering this with some form of foam dressing. She is using compression stockings from elastic therapy which are probably 20/30 mmHg although we just do not seem to be able to identify that. She tells me she puts those on during the day and leaves them on all night and only  changing them once a day. 9/4; patient presents for follow-up. She has been using Hydrofera Blue to the wound bed. The wound is smaller. She has been wearing her compression stockings daily. 01-14-2023 upon evaluation today patient appears to be doing well currently in regard to her wounds. She is doing very similar to when I last saw her. Fortunately I do not think that there is any signs of significant infection unfortunately I do believe that she is continuing to have some issues here with the open wounds and she should be seeing vascular shortly to look back into the venous ablation side of things. ABRAHAM, DIRK ([id number]) [id number] Page 10 of 12 01-21-2023 upon evaluation today patient appears  to be doing well currently in regard to her wound. There are some slough and biofilm noted on the surface of the wound. With that being said she does not have any signs of infection at this point which is good news. No fevers, chills, nausea, vomiting, or diarrhea. 02-14-2023 upon evaluation today patient's wound appears to be doing about the same. She still will not allow for any debridement she states it hurts too bad she does have a venous ablation is being worked up again she was post to have it previous but she did not end up going through with that and then the approval timed on that they are having to review all the evaluation and approval process at this point. She does have an appointment in 2 weeks with the vascular surgeon to go over the results of her most recent venous study. 03-25-2023 upon evaluation today patient appears to be doing well currently in regard to her leg overall although she is having some increased pain this may be secondary to have an infection. Fortunately I do not see any signs of active infection systemically but locally it feels like that may be what is going on here based on what I am experiencing from the patient. 04-08-2023 upon  evaluation today patient appears to be doing well currently in regard to her wound. This is actually showing signs of significant improvement and actually very pleased with where we stand today. I do not see any signs of active infection at this time. No fevers, chills, nausea, vomiting, or diarrhea. 05-01-23 upon evaluation today patient appears to be doing well currently in regard to her wound all things considered. Her compression she is wearing her compression socks every day but she will need something that would provide more compression at the site of the wound. I do not see any evidence of active infection at this time which is great news. Objective Constitutional Obese and well-hydrated in no acute distress. Vitals Time Taken: 4:10 PM, Weight: 400 lbs, Temperature: 98.3 F, Pulse: 103 bpm, Respiratory Rate: 18 breaths/min, Blood Pressure: 146/81 mmHg. Respiratory normal breathing without difficulty. Psychiatric this patient is able to make decisions and demonstrates good insight into disease process. Alert and Oriented x 3. pleasant and cooperative. General Notes: Upon inspection patient's wound bed actually showed signs of good granulation and epithelization at this point. Fortunately I do not see any signs of infection she absolutely does not want me to perform any debridement today therefore that was not done. I do think that the wound is very small but nonetheless still open and again were not really did not need compression unfortunately. Integumentary (Hair, Skin) Wound #5 status is Open. Original cause of wound was Gradually Appeared. The date acquired was: 04/10/2022. The wound has been in treatment 54 weeks. The wound is located on the Left,Medial Lower Leg. The wound measures 0.3cm length x 0.9cm width x 0.2cm depth; 0.212cm^2 area and 0.042cm^3 volume. There is Fat Layer (Subcutaneous Tissue) exposed. There is a medium amount of serosanguineous drainage noted. There is medium  (34-66%) red granulation within the wound bed. There is a medium (34-66%) amount of necrotic tissue within the wound bed including Adherent Slough. Assessment Active Problems ICD-10 Chronic venous hypertension (idiopathic) with ulcer of left lower extremity Non-pressure chronic ulcer of other part of left lower leg with fat layer exposed Lymphedema, not elsewhere classified Plan Follow-up Appointments: Wound #5 Left,Medial Lower Leg: Return Appointment in 2 weeks. Bathing/ Shower/ Hygiene: Wash wounds with antibacterial  soap and water. May shower with wound dressing protected with water repellent cover or cast protector. No tub bath. Anesthetic (Use 'Patient Medications' Section for Anesthetic Order Entry): Lidocaine applied to wound bed ordered were: custom Medi compression garments - Patient needs lifetime use of compression garments for bilat lower legs WOUND #5: - Lower Leg Wound Laterality: Left, Medial Cleanser: Byram Ancillary Kit - 15 Day Supply (Generic) 3 x Per Week/30 Days Discharge Instructions: Use supplies as instructed; Kit contains: (15) Saline Bullets; (15) 3x3 Gauze; 15 pr Gloves Cleanser: Soap and Water 3 x Per Week/30 Days Discharge Instructions: Gently cleanse wound with antibacterial soap, rinse and pat dry prior to dressing wounds SALAYAH, LOOS ([id number]) [id number] Page 11 of 12 Cleanser: Vashe 5.8 (oz) 3 x Per Week/30 Days Discharge Instructions: Use vashe 5.8 (oz) as directed Peri-Wound Care: AandD Ointment 3 x Per Week/30 Days Discharge Instructions: Apply AandD Ointment as directed around edges to help dressing from sticking Prim Dressing: Hydrofera Blue Ready Transfer Foam, 2.5x2.5 (in/in) (DME) (Dispense As Written) 3 x Per Week/30 Days ary Discharge Instructions: Apply Hydrofera Blue Ready to wound bed as directed cut to fit wound bed Secondary Dressing: (BORDER) Zetuvit Plus SILICONE BORDER Dressing 4x4 (in/in) (DME)  (Dispense As Written) 3 x Per Week/30 Days Discharge Instructions: Please do not put silicone bordered dressings under wraps. Use non-bordered dressing only. 1. Based on what I am seeing I am going to recommend that we have the patient going to continue to monitor for any signs of infection or worsening. I do believe that we are doing well with the [facility] but I really feel like she needs better compression. 2. Will continue with the bordered foam dressing to cover. 3. I am going to recommend the patient should continue to utilize her current compression socks although I feel like we need to try to get her something better she is in agreement with this plan. Working to see about making referral to [employer] and prosthetics for custom compression. We will see patient back for reevaluation in 1 week here in the clinic. If anything worsens or changes patient will contact our office for additional recommendations. Electronic Signature(s) Signed: 05/01/2023 5:14:55 PM By: Allen Derry PA-C Entered By: Allen Derry on 05/01/2023 17:14:55 -------------------------------------------------------------------------------- SuperBill Details Patient Name: Date of Service: [name]. 05/01/2023 Medical Record Number: [id number] Patient Account Number: [id number] Date of Birth/Sex: Treating RN: 1985-05-02 (37 y.o. Freddy Finner Primary Care Provider: [name] Other Clinician: [name] Referring Provider: Treating Provider/Extender: Jaclynn Major in Treatment: 54 Diagnosis Coding ICD-10 Codes Code Description [phone] Chronic venous hypertension (idiopathic) with ulcer of left lower extremity L97.822 Non-pressure chronic ulcer of other part of left lower leg with fat layer exposed I89.0 Lymphedema, not elsewhere classified Facility Procedures : CPT4 Code: [id number] Description: 99213 - WOUND CARE VISIT-LEV 3 EST PT Modifier: Quantity:  1 Physician Procedures : CPT4: Description Modifier Code [id number] 99213 - WC PHYS LEVEL 3 - EST PT ICD-10 Diagnosis Description I87.312 Chronic venous hypertension (idiopathic) with ulcer of left lower extremity L97.822 Non-pressure chronic ulcer of other part of left lower leg  with fat layer exposed I89.0 Lymphedema, not elsewhere classified Quantity: 1 : [place], CPT4: W1093 Visit complexity inherent to [facility] assoc. w/medical care services that serve as the continuing focal point for ongoing care related to a patient's condition ICD-10 Diagnosis Description I87.312 Chronic venous hypertension (idiopathic) with  ulcer of left lower extremity L97.822 Non-pressure chronic ulcer of  other part of left lower leg with fat layer exposed I89.0 Lymphedema, not elsewhere classified [name] ([id number]) [id number] Page 12 of 1 Quantity: 1 2 Electronic Signature(s) Signed: 05/01/2023 5:28:51 PM By: Allen Derry PA-C Previous Signature: 05/01/2023 4:50:31 PM Version By: Allen Derry PA-C Previous Signature: 05/01/2023 5:04:52 PM Version By: [name] Entered By: Allen Derry on 05/01/2023 17:28:51

## 2023-05-03 ENCOUNTER — Ambulatory Visit (INDEPENDENT_AMBULATORY_CARE_PROVIDER_SITE_OTHER): Payer: BLUE CROSS/BLUE SHIELD | Admitting: Student

## 2023-05-03 ENCOUNTER — Other Ambulatory Visit (HOSPITAL_COMMUNITY)
Admission: RE | Admit: 2023-05-03 | Discharge: 2023-05-03 | Disposition: A | Discharge status: Home / Self Care | Payer: BLUE CROSS/BLUE SHIELD | Source: Ambulatory Visit | Attending: Family Medicine | Admitting: Family Medicine

## 2023-05-03 VITALS — BP 118/84 | HR 102 | Ht 66.0 in | Wt >= 6400 oz

## 2023-05-03 DIAGNOSIS — B3731 Acute candidiasis of vulva and vagina: Secondary | ICD-10-CM | POA: Insufficient documentation

## 2023-05-03 DIAGNOSIS — N898 Other specified noninflammatory disorders of vagina: Secondary | ICD-10-CM | POA: Insufficient documentation

## 2023-05-03 DIAGNOSIS — Z3009 Encounter for other general counseling and advice on contraception: Secondary | ICD-10-CM | POA: Insufficient documentation

## 2023-05-03 HISTORY — DX: Encounter for other general counseling and advice on contraception: Z30.09

## 2023-05-03 MED ORDER — NORGESTIMATE-ETH ESTRADIOL 0.25-35 MG-MCG PO TABS: 1.0000 | ORAL_TABLET | Freq: Every day | ORAL | 11 refills | Status: DC

## 2023-05-03 MED ORDER — FLUCONAZOLE 150 MG PO TABS: 150.0000 mg | ORAL_TABLET | Freq: Once | ORAL | 0 refills | Status: AC

## 2023-05-03 NOTE — Assessment & Plan Note (Signed)
No external erythema concerning for intertrigo, although does have erythema and mild swelling of vaginal canal probably from pruritus. White-ish discharge appreciated on exam. Will go ahead and treat empirically for vaginal candidiasis with fluconazole 150 mg x 1. Cervicovaginal swab collected to test for gonorrhea/chlamydia/trichomonas/BV and yeast. Will follow-up on these results and treat as appropriate.

## 2023-05-03 NOTE — Patient Instructions (Addendum)
It was great seeing you today.  As we discussed, -I sent in fluconazole for yeast infection.   -I prescribed a birth control pill for you called Sprintec.  Take this every single day at the same time to help prevent unwanted pregnancy -We completed testing today, I will call you if anything is abnormal   If you have any questions or concerns, please feel free to call the clinic.   Have a wonderful day,  Dr. Darral Dash [facility] [phone]

## 2023-05-03 NOTE — Progress Notes (Signed)
    SUBJECTIVE:   CHIEF COMPLAINT / HPI:   [name] is a 39 year-old female here for vaginal itching.  She says she has a history of BV, trichomonas and vaginal candidiasis. Itching has been going on for a few days, denies any discharge or odor. She is sexually active and does not use condoms.  She is not currently using anything for birth control. She is not desiring pregnancy.  PERTINENT  PMH / PSH:   OBJECTIVE:   BP 118/84   Pulse (!) 102   Ht 5\' 6"  (1.676 m)   Wt (!) 423 lb (191.9 kg) Comment: Pt reported  LMP 03/30/2023 (Approximate)   SpO2 95%   BMI 68.27 kg/m   General: NAD, well appearing, pleasant Neuro: A&O GU: External vulva and vagina nonerythematous, without any obvious lesions or rash. White discharge appreciated.  Normal ruggae of vaginal walls, mildly erythematous.  Respiratory: normal WOB on RA.  Extremities: Moving all 4 extremities equally  ASSESSMENT/PLAN:   Vaginal itching No external erythema concerning for intertrigo, although does have erythema and mild swelling of vaginal canal probably from pruritus. White-ish discharge appreciated on exam. Will go ahead and treat empirically for vaginal candidiasis with fluconazole 150 mg x 1. Cervicovaginal swab collected to test for gonorrhea/chlamydia/trichomonas/BV and yeast. Will follow-up on these results and treat as appropriate.  Encounter for counseling regarding contraception Not currently using anything for contraception No contraindications to combined OCP (no hx DVT/PE, no cigarette smoking, no hx migraine w/ aura). Prescribed Sprintec today, encouraged patient to set alarm on phone to remember to take daily Encouraged consistent condom use for STI prevention.    Darral Dash, DO [facility]

## 2023-05-03 NOTE — Assessment & Plan Note (Signed)
Not currently using anything for contraception No contraindications to combined OCP (no hx DVT/PE, no cigarette smoking, no hx migraine w/ aura). Prescribed Sprintec today, encouraged patient to set alarm on phone to remember to take daily Encouraged consistent condom use for STI prevention.

## 2023-05-07 LAB — CERVICOVAGINAL ANCILLARY ONLY
Bacterial Vaginitis (gardnerella): NEGATIVE
Candida Glabrata: POSITIVE — AB
Candida Vaginitis: POSITIVE — AB
Chlamydia: NEGATIVE
Comment: NEGATIVE
Comment: NEGATIVE
Comment: NEGATIVE
Comment: NEGATIVE
Comment: NEGATIVE
Comment: NORMAL
Neisseria Gonorrhea: NEGATIVE
Trichomonas: NEGATIVE

## 2023-05-08 ENCOUNTER — Other Ambulatory Visit: Payer: Self-pay | Admitting: Student

## 2023-05-13 ENCOUNTER — Encounter: Payer: BLUE CROSS/BLUE SHIELD | Admitting: Physician Assistant

## 2023-05-13 DIAGNOSIS — I87312 Chronic venous hypertension (idiopathic) with ulcer of left lower extremity: Secondary | ICD-10-CM | POA: Diagnosis not present

## 2023-05-20 ENCOUNTER — Encounter: Payer: BLUE CROSS/BLUE SHIELD | Attending: Physician Assistant | Admitting: Physician Assistant

## 2023-05-20 DIAGNOSIS — I890 Lymphedema, not elsewhere classified: Secondary | ICD-10-CM | POA: Insufficient documentation

## 2023-05-20 DIAGNOSIS — I87312 Chronic venous hypertension (idiopathic) with ulcer of left lower extremity: Secondary | ICD-10-CM | POA: Diagnosis present

## 2023-05-20 DIAGNOSIS — L97822 Non-pressure chronic ulcer of other part of left lower leg with fat layer exposed: Secondary | ICD-10-CM | POA: Diagnosis not present

## 2023-05-23 ENCOUNTER — Encounter (HOSPITAL_BASED_OUTPATIENT_CLINIC_OR_DEPARTMENT_OTHER): Payer: Self-pay | Admitting: Pulmonary Disease

## 2023-05-23 ENCOUNTER — Institutional Professional Consult (permissible substitution) (HOSPITAL_BASED_OUTPATIENT_CLINIC_OR_DEPARTMENT_OTHER): Payer: BC Managed Care – PPO | Admitting: Pulmonary Disease

## 2023-05-27 ENCOUNTER — Other Ambulatory Visit: Payer: Self-pay | Admitting: Family Medicine

## 2023-05-27 DIAGNOSIS — R252 Cramp and spasm: Secondary | ICD-10-CM

## 2023-05-28 MED ORDER — METHOCARBAMOL 500 MG PO TABS: 500.0000 mg | ORAL_TABLET | Freq: Three times a day (TID) | ORAL | 0 refills | Status: DC | PRN

## 2023-05-29 ENCOUNTER — Ambulatory Visit: Payer: BLUE CROSS/BLUE SHIELD | Admitting: Physician Assistant

## 2023-06-02 ENCOUNTER — Encounter: Payer: Self-pay | Admitting: Family Medicine

## 2023-06-02 DIAGNOSIS — N898 Other specified noninflammatory disorders of vagina: Secondary | ICD-10-CM

## 2023-06-05 ENCOUNTER — Encounter: Payer: BLUE CROSS/BLUE SHIELD | Admitting: Physician Assistant

## 2023-06-05 DIAGNOSIS — I87312 Chronic venous hypertension (idiopathic) with ulcer of left lower extremity: Secondary | ICD-10-CM | POA: Diagnosis not present

## 2023-06-18 ENCOUNTER — Other Ambulatory Visit: Payer: Self-pay | Admitting: Family Medicine

## 2023-06-18 ENCOUNTER — Encounter (INDEPENDENT_AMBULATORY_CARE_PROVIDER_SITE_OTHER): Payer: Self-pay

## 2023-06-18 DIAGNOSIS — N898 Other specified noninflammatory disorders of vagina: Secondary | ICD-10-CM

## 2023-06-18 MED ORDER — BORIC ACID VAGINAL 600 MG VA SUPP: 600.0000 mg | Freq: Every day | VAGINAL | 0 refills | Status: DC

## 2023-06-18 NOTE — Addendum Note (Signed)
 Addended by: [name] on: 06/18/2023 07:25 PM   Modules accepted: Orders

## 2023-06-19 ENCOUNTER — Encounter: Payer: BLUE CROSS/BLUE SHIELD | Attending: Physician Assistant | Admitting: Physician Assistant

## 2023-06-19 DIAGNOSIS — I87312 Chronic venous hypertension (idiopathic) with ulcer of left lower extremity: Secondary | ICD-10-CM | POA: Diagnosis present

## 2023-06-19 DIAGNOSIS — L97822 Non-pressure chronic ulcer of other part of left lower leg with fat layer exposed: Secondary | ICD-10-CM | POA: Diagnosis not present

## 2023-06-19 DIAGNOSIS — I890 Lymphedema, not elsewhere classified: Secondary | ICD-10-CM | POA: Diagnosis not present

## 2023-07-03 ENCOUNTER — Encounter (HOSPITAL_BASED_OUTPATIENT_CLINIC_OR_DEPARTMENT_OTHER): Payer: Self-pay

## 2023-07-03 ENCOUNTER — Ambulatory Visit: Admitting: Physician Assistant

## 2023-07-10 ENCOUNTER — Encounter: Admitting: Internal Medicine

## 2023-07-10 DIAGNOSIS — I87312 Chronic venous hypertension (idiopathic) with ulcer of left lower extremity: Secondary | ICD-10-CM | POA: Diagnosis not present

## 2023-07-16 ENCOUNTER — Encounter: Attending: Internal Medicine | Admitting: Internal Medicine

## 2023-07-16 DIAGNOSIS — I87312 Chronic venous hypertension (idiopathic) with ulcer of left lower extremity: Secondary | ICD-10-CM | POA: Diagnosis present

## 2023-07-16 DIAGNOSIS — L97822 Non-pressure chronic ulcer of other part of left lower leg with fat layer exposed: Secondary | ICD-10-CM | POA: Insufficient documentation

## 2023-07-16 DIAGNOSIS — I890 Lymphedema, not elsewhere classified: Secondary | ICD-10-CM | POA: Insufficient documentation

## 2023-07-17 ENCOUNTER — Encounter: Payer: Self-pay | Admitting: Family Medicine

## 2023-07-18 NOTE — Telephone Encounter (Signed)
 Patient scheduled for tomorrow, 4/4.  Veronda Prude, RN

## 2023-07-19 ENCOUNTER — Ambulatory Visit (INDEPENDENT_AMBULATORY_CARE_PROVIDER_SITE_OTHER): Admitting: Family Medicine

## 2023-07-19 ENCOUNTER — Encounter: Payer: Self-pay | Admitting: Family Medicine

## 2023-07-19 VITALS — BP 119/93 | HR 94 | Ht 66.0 in | Wt >= 6400 oz

## 2023-07-19 DIAGNOSIS — N921 Excessive and frequent menstruation with irregular cycle: Secondary | ICD-10-CM

## 2023-07-19 DIAGNOSIS — N926 Irregular menstruation, unspecified: Secondary | ICD-10-CM

## 2023-07-19 LAB — POCT HEMOGLOBIN: Hemoglobin: 12.5 g/dL (ref 11–14.6)

## 2023-07-19 NOTE — Patient Instructions (Addendum)
 Thank you for coming in today! Here is a summary of what we discussed:  We are checking some labs today. If they are abnormal, I will call you. If they are normal, I will send you a MyChart message (if it is active) or a letter in the mail. If you do not hear about your labs in the next 2 weeks, please call the office.   You will get a MyChart message to schedule a pelvic ultrasound in the next week or so.  If you haven't already, sign up for My Chart to have easy access to your labs results, and communication with your primary care physician.  I recommend that you always bring your medications to each appointment as this makes it easy to ensure you are on the correct medications and helps [place] not miss refills when you need them.  Please call the clinic at [phone] if your symptoms worsen or you have any concerns.  Best, Dr Dolan Amen

## 2023-07-19 NOTE — Progress Notes (Cosign Needed Addendum)
    SUBJECTIVE:   CHIEF COMPLAINT / HPI:   Abnormal uterine bleeding 3-4 months of bleeding multiple times per month (March 13-24 then April 2 started spotting and is now having a heavy period) Changes heavy (overnight) pads 2-3 times per day, including blood clots No lightheadedness, dizziness, chest pain, heart racing, or palpitations Has itching at the end of her period. No itching currently  Periods were regular for the past year or so Has hx of irregular periods, would go months without periods Had similar presentation 2 years ago, was prescribed provera but never took this because bleeding slowed down. Ferritin and POC Hgb normal at that time Last imaging CT abd/pelvis 2017, no abnormalities found.  Pelvic ultrasound in 2007 normal  Sister had cervical (?) cancer, no fhx ovarian, uterine, or breast cancer  Was prescribed Sprintec at last appointment in January, did not take these because she didn't think she'd remember to take them, was worried about side effects  PERTINENT  PMH / PSH: Varicose veins, hypertension, GERD  OBJECTIVE:   BP (!) 119/93   Pulse 94   Ht 5\' 6"  (1.676 m)   Wt (!) 436 lb (197.8 kg)   LMP 06/27/2023   SpO2 99%   BMI 70.37 kg/m   GEN: Sitting in exam room, pleasant and conversant, no acute distress Pulm: Normal work of breathing on room air Neuro: Normal gait, no obvious neuro deficits GU: Patient declined, prefers to do this at next appt. Reviewed importance of GU exam for complete evaluation.  ASSESSMENT/PLAN:   Assessment & Plan Menorrhagia with irregular cycle Broad differential including PCOS, genital tract lesion, thyroid disease, liver disease, coagulopathy.  Patient has not had lab work or pelvic imaging in several years.  Unfortunately clinic lab was closed for the afternoon so patient will return for lab visit next week.  Patient cannot provide urine sample so unable to collect urine pregnancy test. Reassuringly, patient hemodynamically  stable and point-of-care hemoglobin stable at 12.5. -Lab visit scheduled for next week, orders placed for DHEA, FSH, LH, prolactin, estrogen, progesterone, CBC, CMP, TSH, urine pregnancy test --Transvaginal pelvic ultrasound ordered, CMA will message patient to schedule next week --PCP follow-up appointment scheduled for 2 weeks, can consider starting contraception if lab work/pelvic exam is unrevealing   Lorayne Bender, MD [facility]

## 2023-07-19 NOTE — Assessment & Plan Note (Addendum)
 Broad differential including PCOS, genital tract lesion, thyroid disease, liver disease, coagulopathy.  Patient has not had lab work or pelvic imaging in several years.  Unfortunately clinic lab was closed for the afternoon so patient will return for lab visit next week.  Patient cannot provide urine sample so unable to collect urine pregnancy test. Reassuringly, patient hemodynamically stable and point-of-care hemoglobin stable at 12.5. -Lab visit scheduled for next week, orders placed for DHEA, FSH, LH, prolactin, estrogen, progesterone, CBC, CMP, TSH, urine pregnancy test --Transvaginal pelvic ultrasound ordered, CMA will message patient to schedule next week --PCP follow-up appointment scheduled for 2 weeks, can consider starting contraception if lab work/pelvic exam is unrevealing

## 2023-07-23 ENCOUNTER — Ambulatory Visit
Admission: RE | Admit: 2023-07-23 | Discharge: 2023-07-23 | Disposition: A | Discharge status: Home / Self Care | Source: Ambulatory Visit | Attending: Family Medicine | Admitting: Family Medicine

## 2023-07-23 DIAGNOSIS — N926 Irregular menstruation, unspecified: Secondary | ICD-10-CM

## 2023-07-24 ENCOUNTER — Other Ambulatory Visit

## 2023-07-24 ENCOUNTER — Encounter: Admitting: Physician Assistant

## 2023-07-24 ENCOUNTER — Ambulatory Visit

## 2023-07-24 NOTE — Progress Notes (Deleted)
    SUBJECTIVE:   CHIEF COMPLAINT / HPI:   ***  PERTINENT  PMH / PSH: ***  OBJECTIVE:   LMP 06/27/2023  ***  General: NAD, pleasant, able to participate in exam Cardiac: RRR, no murmurs. Respiratory: CTAB, normal effort, No wheezes, rales or rhonchi Abdomen: Bowel sounds present, nontender, nondistended, no hepatosplenomegaly. Extremities: no edema or cyanosis. Skin: warm and dry, no rashes noted Neuro: alert, no obvious focal deficits Psych: Normal affect and mood  ASSESSMENT/PLAN:   No problem-specific Assessment & Plan notes found for this encounter.     Dr. Erick Alley, DO [facility]    {    This will disappear when note is signed, click to select method of visit    :1}

## 2023-07-25 ENCOUNTER — Institutional Professional Consult (permissible substitution) (HOSPITAL_BASED_OUTPATIENT_CLINIC_OR_DEPARTMENT_OTHER): Payer: BC Managed Care – PPO | Admitting: Pulmonary Disease

## 2023-07-26 ENCOUNTER — Encounter: Payer: Self-pay | Admitting: Family Medicine

## 2023-07-30 ENCOUNTER — Other Ambulatory Visit

## 2023-07-31 ENCOUNTER — Encounter: Payer: Self-pay | Admitting: Family Medicine

## 2023-07-31 ENCOUNTER — Other Ambulatory Visit (INDEPENDENT_AMBULATORY_CARE_PROVIDER_SITE_OTHER)

## 2023-07-31 DIAGNOSIS — N926 Irregular menstruation, unspecified: Secondary | ICD-10-CM | POA: Diagnosis not present

## 2023-07-31 LAB — POCT URINE PREGNANCY: Preg Test, Ur: NEGATIVE

## 2023-08-01 ENCOUNTER — Ambulatory Visit: Admitting: Physician Assistant

## 2023-08-05 ENCOUNTER — Ambulatory Visit: Admitting: Physician Assistant

## 2023-08-05 ENCOUNTER — Ambulatory Visit: Admitting: Family Medicine

## 2023-08-05 NOTE — Progress Notes (Deleted)
    SUBJECTIVE:   CHIEF COMPLAINT / HPI: f/u acne  ***  PERTINENT  PMH / PSH: HTN, GERD, Irregular menstrual bleeding  OBJECTIVE:   LMP 06/27/2023   ***  ASSESSMENT/PLAN:   Assessment & Plan  No follow-ups on file.  Ivin Marrow, MD [facility]

## 2023-08-07 ENCOUNTER — Ambulatory Visit: Admitting: Physician Assistant

## 2023-08-07 LAB — CBC
Hematocrit: 43.6 % (ref 34.0–46.6)
Hemoglobin: 13.7 g/dL (ref 11.1–15.9)
MCH: 28.4 pg (ref 26.6–33.0)
MCHC: 31.4 g/dL — ABNORMAL LOW (ref 31.5–35.7)
MCV: 90 fL (ref 79–97)
Platelets: 242 10*3/uL (ref 150–450)
RBC: 4.83 x10E6/uL (ref 3.77–5.28)
RDW: 12.6 % (ref 11.7–15.4)
WBC: 5.7 10*3/uL (ref 3.4–10.8)

## 2023-08-07 LAB — ALB+PRL+FSH+LH+PROG+DHEA+ES...
Dehydroepiandrosterone: 148 ng/dL (ref 31–701)
Estrogen: 280 pg/mL
FSH: 5.7 m[IU]/mL
LH: 7.5 m[IU]/mL
Progesterone: 0.2 ng/mL
Prolactin: 18.2 ng/mL (ref 4.8–33.4)
Sex Hormone Binding: 46.6 nmol/L (ref 24.6–122.0)
Vit D, 25-Hydroxy: 6.4 ng/mL — ABNORMAL LOW (ref 30.0–100.0)

## 2023-08-07 LAB — CMP14+EGFR
ALT: 14 IU/L (ref 0–32)
AST: 19 IU/L (ref 0–40)
Albumin: 3.9 g/dL (ref 3.9–4.9)
Alkaline Phosphatase: 57 IU/L (ref 44–121)
BUN/Creatinine Ratio: 16 (ref 9–23)
BUN: 14 mg/dL (ref 6–20)
Bilirubin Total: 0.4 mg/dL (ref 0.0–1.2)
CO2: 28 mmol/L (ref 20–29)
Calcium: 8.8 mg/dL (ref 8.7–10.2)
Chloride: 99 mmol/L (ref 96–106)
Creatinine, Ser: 0.86 mg/dL (ref 0.57–1.00)
Globulin, Total: 2.9 g/dL (ref 1.5–4.5)
Glucose: 91 mg/dL (ref 70–99)
Potassium: 4.5 mmol/L (ref 3.5–5.2)
Sodium: 140 mmol/L (ref 134–144)
Total Protein: 6.8 g/dL (ref 6.0–8.5)
eGFR: 89 mL/min/{1.73_m2} (ref >59)

## 2023-08-07 LAB — TSH: TSH: 1.85 u[IU]/mL (ref 0.450–4.500)

## 2023-08-08 ENCOUNTER — Encounter: Payer: Self-pay | Admitting: Family Medicine

## 2023-08-08 ENCOUNTER — Other Ambulatory Visit: Payer: Self-pay | Admitting: Family Medicine

## 2023-08-08 DIAGNOSIS — E559 Vitamin D deficiency, unspecified: Secondary | ICD-10-CM

## 2023-08-08 MED ORDER — CHOLECALCIFEROL 1.25 MG (50000 UT) PO TABS: 1.0000 | ORAL_TABLET | ORAL | 0 refills | Status: DC

## 2023-08-13 ENCOUNTER — Ambulatory Visit: Admitting: Family Medicine

## 2023-08-13 ENCOUNTER — Ambulatory Visit: Admitting: Physician Assistant

## 2023-08-13 NOTE — Progress Notes (Deleted)
    SUBJECTIVE:   CHIEF COMPLAINT / HPI: f/u acne  ***  PERTINENT  PMH / PSH: HTN, GERD, Irregular menstrual bleeding  OBJECTIVE:   LMP 06/27/2023   ***  ASSESSMENT/PLAN:   Assessment & Plan  No follow-ups on file.  Ivin Marrow, MD [facility]

## 2023-08-14 ENCOUNTER — Encounter: Admitting: Physician Assistant

## 2023-08-14 DIAGNOSIS — I87312 Chronic venous hypertension (idiopathic) with ulcer of left lower extremity: Secondary | ICD-10-CM | POA: Diagnosis not present

## 2023-08-19 MED ORDER — CHOLECALCIFEROL 1.25 MG (50000 UT) PO TABS: 1.0000 | ORAL_TABLET | ORAL | 0 refills | Status: DC

## 2023-08-19 NOTE — Telephone Encounter (Signed)
 Called pharmacy. They report that they never received prescription from 08/08/23.  Resent prescription this morning. Receipt confirmed at [phone].  Elsie Halo, RN

## 2023-08-28 ENCOUNTER — Ambulatory Visit: Admitting: Physician Assistant

## 2023-08-31 ENCOUNTER — Emergency Department (HOSPITAL_COMMUNITY)

## 2023-08-31 ENCOUNTER — Encounter (HOSPITAL_COMMUNITY): Payer: Self-pay

## 2023-08-31 ENCOUNTER — Inpatient Hospital Stay (HOSPITAL_COMMUNITY)
Admission: EM | Admit: 2023-08-31 | Discharge: 2023-09-08 | DRG: 291 | Disposition: A | Discharge status: Home / Self Care | Attending: Internal Medicine | Admitting: Internal Medicine

## 2023-08-31 ENCOUNTER — Other Ambulatory Visit: Payer: Self-pay

## 2023-08-31 DIAGNOSIS — I509 Heart failure, unspecified: Principal | ICD-10-CM

## 2023-08-31 DIAGNOSIS — I5031 Acute diastolic (congestive) heart failure: Secondary | ICD-10-CM | POA: Diagnosis present

## 2023-08-31 DIAGNOSIS — K219 Gastro-esophageal reflux disease without esophagitis: Secondary | ICD-10-CM | POA: Diagnosis present

## 2023-08-31 DIAGNOSIS — I890 Lymphedema, not elsewhere classified: Secondary | ICD-10-CM | POA: Diagnosis present

## 2023-08-31 DIAGNOSIS — I2489 Other forms of acute ischemic heart disease: Secondary | ICD-10-CM | POA: Insufficient documentation

## 2023-08-31 DIAGNOSIS — R0609 Other forms of dyspnea: Principal | ICD-10-CM

## 2023-08-31 DIAGNOSIS — I110 Hypertensive heart disease with heart failure: Principal | ICD-10-CM | POA: Diagnosis present

## 2023-08-31 DIAGNOSIS — J45909 Unspecified asthma, uncomplicated: Secondary | ICD-10-CM | POA: Diagnosis present

## 2023-08-31 DIAGNOSIS — Z79899 Other long term (current) drug therapy: Secondary | ICD-10-CM

## 2023-08-31 DIAGNOSIS — Z8249 Family history of ischemic heart disease and other diseases of the circulatory system: Secondary | ICD-10-CM

## 2023-08-31 DIAGNOSIS — I5032 Chronic diastolic (congestive) heart failure: Secondary | ICD-10-CM | POA: Diagnosis present

## 2023-08-31 DIAGNOSIS — E669 Obesity, unspecified: Secondary | ICD-10-CM | POA: Diagnosis present

## 2023-08-31 DIAGNOSIS — Z8709 Personal history of other diseases of the respiratory system: Secondary | ICD-10-CM

## 2023-08-31 DIAGNOSIS — Z83438 Family history of other disorder of lipoprotein metabolism and other lipidemia: Secondary | ICD-10-CM

## 2023-08-31 DIAGNOSIS — J9601 Acute respiratory failure with hypoxia: Secondary | ICD-10-CM | POA: Diagnosis present

## 2023-08-31 DIAGNOSIS — Z885 Allergy status to narcotic agent status: Secondary | ICD-10-CM

## 2023-08-31 DIAGNOSIS — Z6841 Body Mass Index (BMI) 40.0 and over, adult: Secondary | ICD-10-CM

## 2023-08-31 DIAGNOSIS — I2721 Secondary pulmonary arterial hypertension: Secondary | ICD-10-CM | POA: Diagnosis present

## 2023-08-31 DIAGNOSIS — Z833 Family history of diabetes mellitus: Secondary | ICD-10-CM

## 2023-08-31 DIAGNOSIS — R7303 Prediabetes: Secondary | ICD-10-CM | POA: Diagnosis present

## 2023-08-31 LAB — CBC WITH DIFFERENTIAL/PLATELET
Abs Immature Granulocytes: 0.04 10*3/uL (ref 0.00–0.07)
Basophils Absolute: 0 10*3/uL (ref 0.0–0.1)
Basophils Relative: 1 %
Eosinophils Absolute: 0.1 10*3/uL (ref 0.0–0.5)
Eosinophils Relative: 1 %
HCT: 43.3 % (ref 36.0–46.0)
Hemoglobin: 13.1 g/dL (ref 12.0–15.0)
Immature Granulocytes: 1 %
Lymphocytes Relative: 40 %
Lymphs Abs: 2 10*3/uL (ref 0.7–4.0)
MCH: 28.2 pg (ref 26.0–34.0)
MCHC: 30.3 g/dL (ref 30.0–36.0)
MCV: 93.1 fL (ref 80.0–100.0)
Monocytes Absolute: 0.6 10*3/uL (ref 0.1–1.0)
Monocytes Relative: 12 %
Neutro Abs: 2.3 10*3/uL (ref 1.7–7.7)
Neutrophils Relative %: 45 %
Platelets: 181 10*3/uL (ref 150–400)
RBC: 4.65 MIL/uL (ref 3.87–5.11)
RDW: 14.5 % (ref 11.5–15.5)
WBC: 5.1 10*3/uL (ref 4.0–10.5)
nRBC: 0.6 % — ABNORMAL HIGH (ref 0.0–0.2)

## 2023-08-31 LAB — COMPREHENSIVE METABOLIC PANEL WITH GFR
ALT: 15 U/L (ref 0–44)
AST: 21 U/L (ref 15–41)
Albumin: 3.4 g/dL — ABNORMAL LOW (ref 3.5–5.0)
Alkaline Phosphatase: 41 U/L (ref 38–126)
Anion gap: 10 (ref 5–15)
BUN: 14 mg/dL (ref 6–20)
CO2: 28 mmol/L (ref 22–32)
Calcium: 8.4 mg/dL — ABNORMAL LOW (ref 8.9–10.3)
Chloride: 99 mmol/L (ref 98–111)
Creatinine, Ser: 0.84 mg/dL (ref 0.44–1.00)
GFR, Estimated: >60 mL/min (ref >60)
Glucose, Bld: 116 mg/dL — ABNORMAL HIGH (ref 70–99)
Potassium: 4.5 mmol/L (ref 3.5–5.1)
Sodium: 137 mmol/L (ref 135–145)
Total Bilirubin: 2 mg/dL — ABNORMAL HIGH (ref 0.0–1.2)
Total Protein: 7.2 g/dL (ref 6.5–8.1)

## 2023-08-31 LAB — CK: Total CK: 112 U/L (ref 38–234)

## 2023-08-31 LAB — HCG, SERUM, QUALITATIVE: Preg, Serum: NEGATIVE

## 2023-08-31 LAB — TROPONIN I (HIGH SENSITIVITY)
Troponin I (High Sensitivity): 19 ng/L — ABNORMAL HIGH (ref <18)
Troponin I (High Sensitivity): 19 ng/L — ABNORMAL HIGH (ref <18)

## 2023-08-31 LAB — D-DIMER, QUANTITATIVE: D-Dimer, Quant: 2.09 ug{FEU}/mL — ABNORMAL HIGH (ref 0.00–0.50)

## 2023-08-31 LAB — BRAIN NATRIURETIC PEPTIDE: B Natriuretic Peptide: 632.1 pg/mL — ABNORMAL HIGH (ref 0.0–100.0)

## 2023-08-31 MED ORDER — IOHEXOL 350 MG/ML SOLN
75.0000 mL | Freq: Once | INTRAVENOUS | Status: AC | PRN
  Administered 2023-08-31: 75 mL via INTRAVENOUS

## 2023-08-31 MED ORDER — FUROSEMIDE 10 MG/ML IJ SOLN
40.0000 mg | Freq: Once | INTRAMUSCULAR | Status: AC
  Administered 2023-09-01: 40 mg via INTRAVENOUS
  Filled 2023-08-31: qty 4

## 2023-08-31 NOTE — ED Provider Notes (Signed)
 [place] EMERGENCY DEPARTMENT AT [facility] Provider Note   CSN: [id number] Arrival date & time: 08/31/23  1951     History  Chief Complaint  Patient presents with   Shortness of Breath    [name] is a 38 y.o. female.  HPI 38 year old female presents with a chief complaint of dyspnea on exertion for about 2 weeks.  She states the symptoms have not gotten worse but have not improved either.  Due to this she is seeking medical evaluation.  She denies any chest pain or fever.  She has a chronic cough but feels like it is stronger over the last 1 week or so.  No productive sputum.  She feels like both of her thighs are swollen and tight/heavy.  She denies any focal weakness in them but they just feel heavier.  She estimates she walks about 30 feet and then feels short of breath.  Home Medications Prior to Admission medications   Medication Sig Start Date End Date Taking? Authorizing Provider  albuterol  (VENTOLIN  HFA) 108 (90 Base) MCG/ACT inhaler Inhale 2 puffs into the lungs every 6 (six) hours as needed for wheezing or shortness of breath. 03/05/23   Ivin Marrow, MD  Cholecalciferol  1.25 MG (50000 UT) TABS Take 1 tablet by mouth once a week. For 8 weeks 08/19/23   Naida Austria, MD  methocarbamol  (ROBAXIN ) 500 MG tablet Take 1 tablet (500 mg total) by mouth 3 (three) times daily as needed for muscle spasms. 05/28/23   Quillen, [name], MD  Multiple Vitamin (MULTIVITAMIN) tablet Take 2 tablets by mouth daily. Vitafusion gummies    [provider]  pantoprazole  (PROTONIX ) 20 MG tablet TAKE 1 TABLET BY MOUTH EVERY DAY 08/13/22   Quillen, [name], MD      Allergies    Tramadol     Review of Systems   Review of Systems  Constitutional:  Negative for fever.  Respiratory:  Positive for cough and shortness of breath.   Cardiovascular:  Positive for leg swelling. Negative for chest pain.  Gastrointestinal:  Negative for abdominal pain.    Physical  Exam Updated Vital Signs BP 100/88   Pulse (!) 116   Temp 98.2 F (36.8 C) (Oral)   Resp (!) 27   Ht 5\' 6"  (1.676 m)   Wt (!) 197.8 kg   LMP 08/24/2023   SpO2 90%   BMI 70.37 kg/m  Physical Exam Vitals and nursing note reviewed.  Constitutional:      General: She is not in acute distress.    Appearance: She is well-developed. She is obese. She is not ill-appearing or diaphoretic.  HENT:     Head: Normocephalic and atraumatic.  Cardiovascular:     Rate and Rhythm: Regular rhythm. Tachycardia present.     Heart sounds: Normal heart sounds.  Pulmonary:     Effort: Pulmonary effort is normal. No accessory muscle usage.     Breath sounds: Examination of the right-lower field reveals decreased breath sounds. Examination of the left-lower field reveals decreased breath sounds. Decreased breath sounds present.  Abdominal:     Palpations: Abdomen is soft.     Tenderness: There is no abdominal tenderness.  Musculoskeletal:     Right lower leg: Edema present.     Left lower leg: Edema present.     Comments: Patient is morbidly obese which limits the assessment of edema but there does appear to be bilateral ankle swelling.  No specific tenderness to either thigh.  She is able  to lift each lower leg off of the stretcher without difficulty.  Skin:    General: Skin is warm and dry.  Neurological:     Mental Status: She is alert.     ED Results / Procedures / Treatments   Labs (all labs ordered are listed, but only abnormal results are displayed) Labs Reviewed  COMPREHENSIVE METABOLIC PANEL WITH GFR  CBC WITH DIFFERENTIAL/PLATELET  BRAIN NATRIURETIC PEPTIDE  CK  TROPONIN I (HIGH SENSITIVITY)    EKG None  Radiology No results found.  Procedures Ultrasound ED Echo  Date/Time: 08/31/2023 9:05 PM  Performed by: Jerilynn Montenegro, MD Authorized by: Jerilynn Montenegro, MD   Procedure details:    Indications: dyspnea     Views: subxiphoid     Images: archived     Limitations:   Body habitus Findings:    Pericardium: no pericardial effusion     Cardiac Activity: hyperdynamic       Medications Ordered in ED Medications - No data to display  ED Course/ Medical Decision Making/ A&P                                 Medical Decision Making Amount and/or Complexity of Data Reviewed External Data Reviewed: notes. Labs: ordered.    Details: Elevated BNP and ddimer. Mildly elevated troponins, but flat. Radiology: ordered and independent interpretation performed.    Details: cardiomegaly ECG/medicine tests: ordered and independent interpretation performed.    Details: Diffuse nonspecific T waves, similar to priors  Risk Prescription drug management.   Patient presents with dyspnea x 2 weeks. No distress, but O2 sats at rest are borderline in the high 80s, low 90s. Was put on 2 L of oxygen. I suspect based on workup she probably has CHF. However, with her tachycardia, Ddimer was sent and is elevated. CTA has been obtained but is pending. Will need admission after results.  Care transferred to Dr. [name] Arch.        Final Clinical Impression(s) / ED Diagnoses Final diagnoses:  None    Rx / DC Orders ED Discharge Orders     None         Jerilynn Montenegro, MD 08/31/23 2309

## 2023-08-31 NOTE — ED Triage Notes (Signed)
 Pt presents with ShOB, non productive cough, and bilateral leg heaviness x 2 weeks.

## 2023-08-31 NOTE — ED Provider Notes (Signed)
 I assumed care of this patient from previous provider.  Please see their note for further details of history, exam, and MDM.   Briefly patient is a 38 y.o. female who presented with SOB.  Workup is suspicious for undiagnosed heart failure.  Patient has a documented history of pulmonary arterial hypertension.  No prior echo.  Dimer was elevated and currently awaiting a CTA to rule out PE.  CTA was negative for pulmonary edema.  No pneumonia, pneumothorax or pleural effusions.  Patient was hypoxic with sats in the high 80s on room air.  Plan to admit for further workup and management.   Admitted to medicine.          Lindle Rhea, MD 09/01/23 [id number]

## 2023-09-01 ENCOUNTER — Encounter (HOSPITAL_COMMUNITY): Payer: Self-pay | Admitting: Family Medicine

## 2023-09-01 ENCOUNTER — Inpatient Hospital Stay (HOSPITAL_COMMUNITY)

## 2023-09-01 DIAGNOSIS — I509 Heart failure, unspecified: Secondary | ICD-10-CM | POA: Diagnosis not present

## 2023-09-01 DIAGNOSIS — I5031 Acute diastolic (congestive) heart failure: Secondary | ICD-10-CM | POA: Diagnosis present

## 2023-09-01 DIAGNOSIS — I110 Hypertensive heart disease with heart failure: Secondary | ICD-10-CM | POA: Diagnosis present

## 2023-09-01 DIAGNOSIS — Z885 Allergy status to narcotic agent status: Secondary | ICD-10-CM | POA: Diagnosis not present

## 2023-09-01 DIAGNOSIS — Z79899 Other long term (current) drug therapy: Secondary | ICD-10-CM | POA: Diagnosis not present

## 2023-09-01 DIAGNOSIS — I5021 Acute systolic (congestive) heart failure: Secondary | ICD-10-CM

## 2023-09-01 DIAGNOSIS — Z8249 Family history of ischemic heart disease and other diseases of the circulatory system: Secondary | ICD-10-CM | POA: Diagnosis not present

## 2023-09-01 DIAGNOSIS — Z83438 Family history of other disorder of lipoprotein metabolism and other lipidemia: Secondary | ICD-10-CM | POA: Diagnosis not present

## 2023-09-01 DIAGNOSIS — Z833 Family history of diabetes mellitus: Secondary | ICD-10-CM | POA: Diagnosis not present

## 2023-09-01 DIAGNOSIS — R7303 Prediabetes: Secondary | ICD-10-CM | POA: Diagnosis present

## 2023-09-01 DIAGNOSIS — I2721 Secondary pulmonary arterial hypertension: Secondary | ICD-10-CM | POA: Diagnosis present

## 2023-09-01 DIAGNOSIS — K219 Gastro-esophageal reflux disease without esophagitis: Secondary | ICD-10-CM | POA: Diagnosis present

## 2023-09-01 DIAGNOSIS — I2489 Other forms of acute ischemic heart disease: Secondary | ICD-10-CM | POA: Diagnosis present

## 2023-09-01 DIAGNOSIS — I890 Lymphedema, not elsewhere classified: Secondary | ICD-10-CM | POA: Diagnosis present

## 2023-09-01 DIAGNOSIS — Z8709 Personal history of other diseases of the respiratory system: Secondary | ICD-10-CM | POA: Diagnosis not present

## 2023-09-01 DIAGNOSIS — J9601 Acute respiratory failure with hypoxia: Secondary | ICD-10-CM | POA: Diagnosis present

## 2023-09-01 DIAGNOSIS — Z6841 Body Mass Index (BMI) 40.0 and over, adult: Secondary | ICD-10-CM | POA: Diagnosis not present

## 2023-09-01 DIAGNOSIS — J45909 Unspecified asthma, uncomplicated: Secondary | ICD-10-CM | POA: Diagnosis present

## 2023-09-01 DIAGNOSIS — I5032 Chronic diastolic (congestive) heart failure: Secondary | ICD-10-CM | POA: Diagnosis present

## 2023-09-01 LAB — CBC
HCT: 44 % (ref 36.0–46.0)
Hemoglobin: 13.3 g/dL (ref 12.0–15.0)
MCH: 27.8 pg (ref 26.0–34.0)
MCHC: 30.2 g/dL (ref 30.0–36.0)
MCV: 91.9 fL (ref 80.0–100.0)
Platelets: 172 10*3/uL (ref 150–400)
RBC: 4.79 MIL/uL (ref 3.87–5.11)
RDW: 14.6 % (ref 11.5–15.5)
WBC: 6.5 10*3/uL (ref 4.0–10.5)
nRBC: 0 % (ref 0.0–0.2)

## 2023-09-01 LAB — ECHOCARDIOGRAM COMPLETE
AR max vel: 3.86 cm2
AV Area VTI: 3.88 cm2
AV Area mean vel: 3.88 cm2
AV Mean grad: 3 mmHg
AV Peak grad: 5.5 mmHg
Ao pk vel: 1.17 m/s
Area-P 1/2: 4.71 cm2
Calc EF: 66.3 %
Height: 66 in
S' Lateral: 3.1 cm
Single Plane A2C EF: 66.6 %
Single Plane A4C EF: 61 %
Weight: 6976 [oz_av]

## 2023-09-01 LAB — BASIC METABOLIC PANEL WITH GFR
Anion gap: 9 (ref 5–15)
BUN: 15 mg/dL (ref 6–20)
CO2: 29 mmol/L (ref 22–32)
Calcium: 8.7 mg/dL — ABNORMAL LOW (ref 8.9–10.3)
Chloride: 100 mmol/L (ref 98–111)
Creatinine, Ser: 0.85 mg/dL (ref 0.44–1.00)
GFR, Estimated: >60 mL/min (ref >60)
Glucose, Bld: 109 mg/dL — ABNORMAL HIGH (ref 70–99)
Potassium: 4.3 mmol/L (ref 3.5–5.1)
Sodium: 138 mmol/L (ref 135–145)

## 2023-09-01 LAB — HEPATIC FUNCTION PANEL
ALT: 16 U/L (ref 0–44)
AST: 19 U/L (ref 15–41)
Albumin: 3.4 g/dL — ABNORMAL LOW (ref 3.5–5.0)
Alkaline Phosphatase: 45 U/L (ref 38–126)
Bilirubin, Direct: 0.2 mg/dL (ref 0.0–0.2)
Indirect Bilirubin: 0.8 mg/dL (ref 0.3–0.9)
Total Bilirubin: 1 mg/dL (ref 0.0–1.2)
Total Protein: 7 g/dL (ref 6.5–8.1)

## 2023-09-01 LAB — MAGNESIUM: Magnesium: 1.8 mg/dL (ref 1.7–2.4)

## 2023-09-01 LAB — HIV ANTIBODY (ROUTINE TESTING W REFLEX): HIV Screen 4th Generation wRfx: NONREACTIVE

## 2023-09-01 MED ORDER — ENOXAPARIN SODIUM 100 MG/ML IJ SOSY
98.0000 mg | PREFILLED_SYRINGE | INTRAMUSCULAR | Status: DC
  Administered 2023-09-01 – 2023-09-03 (×2): 98 mg via SUBCUTANEOUS
  Filled 2023-09-01 (×3): qty 1

## 2023-09-01 MED ORDER — ONDANSETRON HCL 4 MG/2ML IJ SOLN: 4.0000 mg | Freq: Four times a day (QID) | INTRAMUSCULAR | Status: DC | PRN

## 2023-09-01 MED ORDER — ONDANSETRON HCL 4 MG PO TABS: 4.0000 mg | ORAL_TABLET | Freq: Four times a day (QID) | ORAL | Status: DC | PRN

## 2023-09-01 MED ORDER — SODIUM CHLORIDE 0.9% FLUSH
3.0000 mL | Freq: Two times a day (BID) | INTRAVENOUS | Status: DC
  Administered 2023-09-01 – 2023-09-08 (×16): 3 mL via INTRAVENOUS

## 2023-09-01 MED ORDER — METHOCARBAMOL 500 MG PO TABS
750.0000 mg | ORAL_TABLET | Freq: Every day | ORAL | Status: DC
  Administered 2023-09-01 – 2023-09-07 (×8): 750 mg via ORAL
  Filled 2023-09-01 (×8): qty 2

## 2023-09-01 MED ORDER — OXYCODONE HCL 5 MG PO TABS
5.0000 mg | ORAL_TABLET | Freq: Four times a day (QID) | ORAL | Status: DC | PRN
  Administered 2023-09-01 – 2023-09-06 (×4): 5 mg via ORAL
  Filled 2023-09-01 (×3): qty 1
  Filled 2023-09-01: qty 2
  Filled 2023-09-01: qty 1

## 2023-09-01 MED ORDER — POLYETHYLENE GLYCOL 3350 17 G PO PACK: 17.0000 g | PACK | Freq: Every day | ORAL | Status: DC | PRN

## 2023-09-01 MED ORDER — FUROSEMIDE 10 MG/ML IJ SOLN
40.0000 mg | Freq: Two times a day (BID) | INTRAMUSCULAR | Status: DC
  Administered 2023-09-01 – 2023-09-03 (×4): 40 mg via INTRAVENOUS
  Filled 2023-09-01 (×4): qty 4

## 2023-09-01 MED ORDER — ACETAMINOPHEN 325 MG PO TABS
650.0000 mg | ORAL_TABLET | Freq: Four times a day (QID) | ORAL | Status: DC | PRN
  Administered 2023-09-01 – 2023-09-02 (×2): 650 mg via ORAL
  Filled 2023-09-01 (×2): qty 2

## 2023-09-01 MED ORDER — PERFLUTREN LIPID MICROSPHERE
1.0000 mL | INTRAVENOUS | Status: AC | PRN
  Administered 2023-09-01: 2 mL via INTRAVENOUS

## 2023-09-01 MED ORDER — ACETAMINOPHEN 650 MG RE SUPP: 650.0000 mg | Freq: Four times a day (QID) | RECTAL | Status: DC | PRN

## 2023-09-01 NOTE — Progress Notes (Signed)
   09/01/23 0944  TOC Brief Assessment  Insurance and Status Reviewed  Patient has primary care physician Yes  Home environment has been reviewed apartment  Prior level of function: independent  Prior/Current Home Services No current home services  Social Drivers of Health Review SDOH reviewed no interventions necessary  Readmission risk has been reviewed Yes  Transition of care needs transition of care needs identified, TOC will continue to follow    TOC will continue to follow for discharge needs.  [place], MSW, LCSW 09/01/2023 9:45 AM

## 2023-09-01 NOTE — Progress Notes (Signed)
  PROGRESS NOTE  Patient admitted earlier this morning. See H&P.   [name] is a 38 y.o. female with medical history significant for childhood asthma, hypertension, and BMI 70 who presents with shortness of breath and sensation of heaviness involving the bilateral lower extremities.  She was found to have elevated BNP 632.  CTA chest negative for PE.  Chest x-ray revealed cardiomegaly and central vascular congestion.  Patient was started on IV Lasix and echocardiogram ordered.  Patient doing well, ins and outs are not accurately recorded.  States that she has been up all night urinating.  Still remains volume overloaded.  Denies shortness of breath.  Echocardiogram with normal EF 55 to 60%.  Grade 1 diastolic dysfunction.  Likely in setting of hypertension as well as obesity.  Continue IV Lasix Strict I's and O's Fluid restriction diet, discussed with patient today    Status is: Inpatient Remains inpatient appropriate because: IV Lasix   Daren Eck, DO [facility] 09/01/2023, 12:40 PM  Available via Epic secure chat 7am-7pm After these hours, please refer to coverage provider listed on [email or web address]

## 2023-09-01 NOTE — H&P (Signed)
 History and Physical    DERIAN PFOST NWG:[id number] DOB: September 28, 1985 DOA: 08/31/2023  PCP: Ivin Marrow, MD   Patient coming from: Home   Chief Complaint: SOB, bilateral leg heaviness  HPI: [name] is a 38 y.o. female with medical history significant for childhood asthma, hypertension, and BMI 70 who presents with shortness of breath and sensation of heaviness involving the bilateral lower extremities.  Patient describes progressive exertional dyspnea over the course of a couple weeks.  She has also developed a sensation of heaviness involving the bilateral lower extremities over the same interval.  She has had a cough associated with this but no sputum production, fever, or chills.  ED Course: Upon arrival to the ED, patient is found to be afebrile and saturating upper 80s on room air with mild tachypnea, tachycardia, and elevated blood pressure.  Labs are most notable for normal creatinine, normal potassium, normal WBC, normal hemoglobin, troponin 19, BNP 632, and D-dimer 2.09.  Chest x-ray is concerning for cardiomegaly and central vascular congestion.  CTA chest is negative for PE.  Patient was placed on supplemental oxygen and given 40 mg IV Lasix.  Review of Systems:  All other systems reviewed and apart from HPI, are negative.  Past Medical History:  Diagnosis Date   Ankle ulcer (HCC)    right   Childhood asthma    Chronic lower back pain    GERD (gastroesophageal reflux disease)    Headache    "only when I'm hungry" (10/14/2015)   Hypertension    Obesity    Varicose veins    VSD (ventricular septal defect)    "had hole in bottom of her heart; closed up on it's own; no OR" /mom 10/14/2015)    Past Surgical History:  Procedure Laterality Date   HERNIA REPAIR     INSERTION OF MESH N/A 10/14/2015   Procedure: INSERTION OF MESH;  Surgeon: Adalberto Hollow, MD;  Location: [facility] OR;  Service: General;  Laterality: N/A;   LAPAROSCOPIC INCISIONAL / UMBILICAL / VENTRAL  HERNIA REPAIR  10/14/2015   VHR w/mesh   TONSILLECTOMY AND ADENOIDECTOMY  1990s   TYMPANOSTOMY TUBE PLACEMENT Bilateral 1990s   VEIN LIGATION AND STRIPPING Right 12/15/2014   Procedure: SAPHENOUS VEIN LIGATION AND STRIPPING; GREATER THAN 20 STAB PHLEBECTOMIES;  Surgeon: Mayo Speck, MD;  Location: [facility] OR;  Service: Vascular;  Laterality: Right;   VENTRAL HERNIA REPAIR N/A 10/14/2015   Procedure: LAPAROSCOPIC VENTRAL HERNIA;  Surgeon: Adalberto Hollow, MD;  Location: [facility] OR;  Service: General;  Laterality: N/A;    Social History:   reports that she has never smoked. She has never used smokeless tobacco. She reports current alcohol use. She reports current drug use. Drug: Marijuana.  Allergies  Allergen Reactions   Tramadol  Other (See Comments)    Did not make her feel like herself    Family History  Problem Relation Age of Onset   Diabetes Mother    Varicose Veins Mother    Hypertension Mother    Hyperlipidemia Mother    Healthy Father    Varicose Veins Sister    Cancer Sister        unsure of type but requiring hysterectomy     Prior to Admission medications   Medication Sig Start Date End Date Taking? Authorizing Provider  albuterol  (VENTOLIN  HFA) 108 (90 Base) MCG/ACT inhaler Inhale 2 puffs into the lungs every 6 (six) hours as needed for wheezing or shortness of breath. 03/05/23  Yes Ivin Marrow, MD  Cholecalciferol  1.25 MG (50000 UT) TABS Take 1 tablet by mouth once a week. For 8 weeks 08/19/23  Yes Hindel, Leah, MD  methocarbamol  (ROBAXIN ) 750 MG tablet Take 750 mg by mouth at bedtime. 05/30/23  Yes [provider]  Multiple Vitamin (MULTIVITAMIN) tablet Take 2 tablets by mouth daily. Vitafusion gummies Patient not taking: Reported on 08/31/2023    [provider]    Physical Exam: Vitals:   08/31/23 2000 08/31/23 2012 08/31/23 2030 08/31/23 2354  BP: 100/88  112/74 (!) 153/98  Pulse: (!) 116  100 100  Resp: (!) 27  (!) 22 (!) 21  Temp: 98.2 F (36.8  C)   (!) 97.3 F (36.3 C)  TempSrc: Oral   Oral  SpO2: 90%  100% 100%  Weight:  (!) 197.8 kg    Height:  5\' 6"  (1.676 m)       Constitutional: NAD, no pallor or diaphoresis   Eyes: PERTLA, lids and conjunctivae normal ENMT: Mucous membranes are moist. Posterior pharynx clear of any exudate or lesions.   Neck: supple, no masses  Respiratory: Dyspneic with speech. No wheezing.    Cardiovascular: Rate ~110 and regular. Bilateral lower extremity edema.  Abdomen: No tenderness, soft. Bowel sounds active.  Musculoskeletal: no clubbing / cyanosis. No joint deformity upper and lower extremities.   Skin: no significant rashes, lesions, ulcers. Warm, dry, well-perfused. Neurologic: CN 2-12 grossly intact. Moving all extremities. Alert and oriented.  Psychiatric: Pleasant. Cooperative.    Labs and Imaging on Admission: I have personally reviewed following labs and imaging studies  CBC: Recent Labs  Lab 08/31/23 2013  WBC 5.1  NEUTROABS 2.3  HGB 13.1  HCT 43.3  MCV 93.1  PLT 181   Basic Metabolic Panel: Recent Labs  Lab 08/31/23 2013  NA 137  K 4.5  CL 99  CO2 28  GLUCOSE 116*  BUN 14  CREATININE 0.84  CALCIUM 8.4*   GFR: Estimated Creatinine Clearance: 166 mL/min (by C-G formula based on SCr of 0.84 mg/dL). Liver Function Tests: Recent Labs  Lab 08/31/23 2013  AST 21  ALT 15  ALKPHOS 41  BILITOT 2.0*  PROT 7.2  ALBUMIN 3.4*   No results for input(s): "LIPASE", "AMYLASE" in the last 168 hours. No results for input(s): "AMMONIA" in the last 168 hours. Coagulation Profile: No results for input(s): "INR", "PROTIME" in the last 168 hours. Cardiac Enzymes: Recent Labs  Lab 08/31/23 2013  CKTOTAL 112   BNP (last 3 results) No results for input(s): "PROBNP" in the last 8760 hours. HbA1C: No results for input(s): "HGBA1C" in the last 72 hours. CBG: No results for input(s): "GLUCAP" in the last 168 hours. Lipid Profile: No results for input(s): "CHOL", "HDL",  "LDLCALC", "TRIG", "CHOLHDL", "LDLDIRECT" in the last 72 hours. Thyroid  Function Tests: No results for input(s): "TSH", "T4TOTAL", "FREET4", "T3FREE", "THYROIDAB" in the last 72 hours. Anemia Panel: No results for input(s): "VITAMINB12", "FOLATE", "FERRITIN", "TIBC", "IRON", "RETICCTPCT" in the last 72 hours. Urine analysis:    Component Value Date/Time   COLORURINE YELLOW 01/02/2020 2101   APPEARANCEUR CLEAR 01/02/2020 2101   LABSPEC 1.024 01/02/2020 2101   PHURINE 7.0 01/02/2020 2101   GLUCOSEU NEGATIVE 01/02/2020 2101   HGBUR NEGATIVE 01/02/2020 2101   BILIRUBINUR NEGATIVE 01/02/2020 2101   KETONESUR NEGATIVE 01/02/2020 2101   PROTEINUR NEGATIVE 01/02/2020 2101   UROBILINOGEN 1.0 02/04/2015 0517   NITRITE NEGATIVE 01/02/2020 2101   LEUKOCYTESUR NEGATIVE 01/02/2020 2101   Sepsis Labs: @LABRCNTIP (procalcitonin:4,lacticidven:4) )No results found for  this or any previous visit (from the past 240 hours).   Radiological Exams on Admission: CT Angio Chest PE W and/or Wo Contrast Result Date: 08/31/2023 CLINICAL DATA:  Positive D-dimer and shortness of breath EXAM: CT ANGIOGRAPHY CHEST WITH CONTRAST TECHNIQUE: Multidetector CT imaging of the chest was performed using the standard protocol during bolus administration of intravenous contrast. Multiplanar CT image reconstructions and MIPs were obtained to evaluate the vascular anatomy. RADIATION DOSE REDUCTION: This exam was performed according to the departmental dose-optimization program which includes automated exposure control, adjustment of the mA and/or kV according to patient size and/or use of iterative reconstruction technique. CONTRAST:  75mL OMNIPAQUE  IOHEXOL  350 MG/ML SOLN COMPARISON:  Chest x-ray from earlier in the same day. FINDINGS: Cardiovascular: Thoracic aorta and its branches are within normal limits. Heart is mildly enlarged in size. Pulmonary artery shows no filling defect to suggest pulmonary embolism. Mediastinum/Nodes:  Thoracic inlet is within normal limits. No hilar or mediastinal adenopathy is noted. The esophagus as visualized is within normal limits. Lungs/Pleura: Lungs are well aerated bilaterally. No focal infiltrate or effusion is seen. No parenchymal nodules are seen. Upper Abdomen: Visualized upper abdomen is within normal limits. Musculoskeletal: No chest wall abnormality. No acute or significant osseous findings. Review of the MIP images confirms the above findings. IMPRESSION: No evidence of pulmonary embolism. No acute abnormality noted. Electronically Signed   By: Violeta Grey M.D.   On: 08/31/2023 23:25   DG Chest 2 View Result Date: 08/31/2023 CLINICAL DATA:  Dyspnea. Shortness of breath and nonproductive cough. EXAM: CHEST - 2 VIEW COMPARISON:  10/17/2021 FINDINGS: Cardiomegaly is unchanged. Mediastinal contours are stable. Mild central vascular congestion without pulmonary edema. No focal airspace disease. No pleural effusion or pneumothorax. None limited assessment, no acute osseous findings. IMPRESSION: Chronic cardiomegaly.  Mild central vascular congestion. Electronically Signed   By: Chadwick Colonel M.D.   On: 08/31/2023 20:49    EKG: Independently reviewed. Sinus rhythm, biatrial enlargement, RVH.   Assessment/Plan  1. Acute CHF; acute hypoxic respiratory failure  - Continue diuresis with IV Lasix, monitor weight and I/Os, check echocardiogram, continue supplemental O2 as-needed     DVT prophylaxis: Lovenox  Code Status: Full  Level of Care: Level of care: Telemetry Family Communication: None present Disposition Plan:  Patient is from: home  Anticipated d/c is to: Home  Anticipated d/c date is: 09/03/23  Patient currently: Pending improved volume status and oxygenation, echocardiogram  Consults called: None  Admission status: Inpatient     Walton Guppy, MD [facility]  09/01/2023, 12:06 AM

## 2023-09-01 NOTE — Progress Notes (Signed)
 Echocardiogram 2D Echocardiogram has been performed.  Gustie Bobb N Suri Tafolla,RDCS 09/01/2023, 8:41 AM

## 2023-09-01 NOTE — Plan of Care (Signed)
  Problem: Clinical Measurements: Goal: Ability to maintain clinical measurements within normal limits will improve Outcome: Progressing Goal: Will remain free from infection Outcome: Progressing Goal: Diagnostic test results will improve Outcome: Progressing Goal: Respiratory complications will improve Outcome: Progressing Goal: Cardiovascular complication will be avoided Outcome: Progressing   Problem: Activity: Goal: Risk for activity intolerance will decrease Outcome: Progressing   Problem: Nutrition: Goal: Adequate nutrition will be maintained Outcome: Progressing   Problem: Elimination: Goal: Will not experience complications related to bowel motility Outcome: Progressing Goal: Will not experience complications related to urinary retention Outcome: Progressing   Problem: Pain Managment: Goal: General experience of comfort will improve and/or be controlled Outcome: Progressing   Problem: Safety: Goal: Ability to remain free from injury will improve Outcome: Progressing

## 2023-09-02 ENCOUNTER — Ambulatory Visit: Payer: BC Managed Care – PPO | Attending: Surgery | Admitting: Surgery

## 2023-09-02 DIAGNOSIS — I5031 Acute diastolic (congestive) heart failure: Secondary | ICD-10-CM | POA: Diagnosis not present

## 2023-09-02 LAB — BASIC METABOLIC PANEL WITH GFR
Anion gap: 12 (ref 5–15)
BUN: 14 mg/dL (ref 6–20)
CO2: 30 mmol/L (ref 22–32)
Calcium: 8.8 mg/dL — ABNORMAL LOW (ref 8.9–10.3)
Chloride: 95 mmol/L — ABNORMAL LOW (ref 98–111)
Creatinine, Ser: 1.03 mg/dL — ABNORMAL HIGH (ref 0.44–1.00)
GFR, Estimated: >60 mL/min (ref >60)
Glucose, Bld: 123 mg/dL — ABNORMAL HIGH (ref 70–99)
Potassium: 4 mmol/L (ref 3.5–5.1)
Sodium: 137 mmol/L (ref 135–145)

## 2023-09-02 NOTE — TOC Progression Note (Signed)
 Transition of Care [facility]) - Progression Note   Patient Details  Name: [name] MRN: [id number] Date of Birth: [birth year]  [facility] of [facility]) CM/SW Contact  Zenon Hilda, LCSW Phone Number: 09/02/2023, 9:37 AM  Clinical Narrative: [facility] consulted for heart failure screening, but consult was already placed for the heart failure navigation team. TOC to clear consult at this time.  Barriers to Discharge: Continued Medical Work up  Social Determinants of Health (SDOH) Interventions SDOH Screenings   Food Insecurity: No Food Insecurity (09/01/2023)  Housing: Unknown (09/01/2023)  Transportation Needs: No Transportation Needs (09/01/2023)  Utilities: Not At Risk (09/01/2023)  Depression (PHQ2-9): Low Risk  (07/19/2023)  Social Connections: Socially Integrated (09/01/2023)  Tobacco Use: Low Risk  (09/01/2023)   Readmission Risk Interventions    09/01/2023    9:43 AM  Readmission Risk Prevention Plan  Post Dischage Appt Complete  Medication Screening Complete  Transportation Screening Complete

## 2023-09-02 NOTE — Progress Notes (Signed)
 PROGRESS NOTE    [name]  MWN:[id number] DOB: [birth year] DOA: 08/31/2023 PCP: [name] Marrow, MD     Brief Narrative:  [name] is a 38 y.o. female with medical history significant for childhood asthma, hypertension, and BMI 70 who presents with shortness of breath and sensation of heaviness involving the bilateral lower extremities.  She was found to have elevated BNP 632.  CTA chest negative for PE.  Chest x-ray revealed cardiomegaly and central vascular congestion.  Patient was started on IV Lasix .  Echocardiogram revealed EF 55%, grade 1 diastolic dysfunction.  New events last 24 hours / Subjective: Continues to urinate well, I/O is in accurately documented.  No shortness of breath at rest today.  Remains volume overloaded.  Assessment & Plan:   Principal Problem:   Acute diastolic CHF (congestive heart failure) (HCC) Active Problems:   Obesity   Acute respiratory failure with hypoxia (HCC)  Acute diastolic CHF - Echocardiogram with normal EF 55 to 60%.  Grade 1 diastolic dysfunction.  Likely in setting of hypertension as well as obesity. - BNP 632.1 - Continue IV Lasix  - Strict I's and O's - Fluid restriction diet, discussed with patient today  Demand ischemia - Troponin 19, 19  Obesity - Estimated body mass index is 70.38 kg/m as calculated from the following:   Height as of this encounter: 5\' 6"  (1.676 m).   Weight as of this encounter: 197.8 kg.   DVT prophylaxis: Lovenox    Code Status: Full Family Communication: None  Disposition Plan: Home Status is: Inpatient Remains inpatient appropriate because: IV Lasix     Antimicrobials:  Anti-infectives (From admission, onward)    None        Objective: Vitals:   09/01/23 0950 09/01/23 2014 09/02/23 0500 09/02/23 0502  BP: (!) 142/101 (!) 135/106  (!) 116/93  Pulse: 89 95  91  Resp:  20  19  Temp: 98.2 F (36.8 C) 98.6 F (37 C)  98.6 F (37 C)  TempSrc: Oral     SpO2: 95% 94%  100%   Weight:   (!) 197.8 kg   Height:        Intake/Output Summary (Last 24 hours) at 09/02/2023 1042 Last data filed at 09/01/2023 1415 Gross per 24 hour  Intake --  Output 900 ml  Net -900 ml   Filed Weights   08/31/23 2012 09/02/23 0500  Weight: (!) 197.8 kg (!) 197.8 kg    Examination:  General exam: Appears calm and comfortable  Respiratory system: Clear to auscultation. Respiratory effort normal. No respiratory distress. No conversational dyspnea.  Cardiovascular system: S1 & S2 heard, RRR. No murmurs. +Bilateral pedal edema. Gastrointestinal system: Abdomen is nondistended, soft and nontender. Normal bowel sounds heard. Central nervous system: Alert and oriented. No focal neurological deficits. Speech clear.  Extremities: Symmetric in appearance  Skin: No rashes, lesions or ulcers on exposed skin  Psychiatry: Judgement and insight appear normal. Mood & affect appropriate.   Data Reviewed: I have personally reviewed following labs and imaging studies  CBC: Recent Labs  Lab 08/31/23 2013 09/01/23 0708  WBC 5.1 6.5  NEUTROABS 2.3  --   HGB 13.1 13.3  HCT 43.3 44.0  MCV 93.1 91.9  PLT 181 172   Basic Metabolic Panel: Recent Labs  Lab 08/31/23 2013 09/01/23 0708 09/02/23 0345  NA 137 138 137  K 4.5 4.3 4.0  CL 99 100 95*  CO2 28 29 30   GLUCOSE 116* 109* 123*  BUN 14 15  14  CREATININE 0.84 0.85 1.03*  CALCIUM 8.4* 8.7* 8.8*  MG  --  1.8  --    GFR: Estimated Creatinine Clearance: 135.4 mL/min (A) (by C-G formula based on SCr of 1.03 mg/dL (H)). Liver Function Tests: Recent Labs  Lab 08/31/23 2013 09/01/23 0708  AST 21 19  ALT 15 16  ALKPHOS 41 45  BILITOT 2.0* 1.0  PROT 7.2 7.0  ALBUMIN 3.4* 3.4*   No results for input(s): "LIPASE", "AMYLASE" in the last 168 hours. No results for input(s): "AMMONIA" in the last 168 hours. Coagulation Profile: No results for input(s): "INR", "PROTIME" in the last 168 hours. Cardiac Enzymes: Recent Labs  Lab  08/31/23 2013  CKTOTAL 112   BNP (last 3 results) No results for input(s): "PROBNP" in the last 8760 hours. HbA1C: No results for input(s): "HGBA1C" in the last 72 hours. CBG: No results for input(s): "GLUCAP" in the last 168 hours. Lipid Profile: No results for input(s): "CHOL", "HDL", "LDLCALC", "TRIG", "CHOLHDL", "LDLDIRECT" in the last 72 hours. Thyroid  Function Tests: No results for input(s): "TSH", "T4TOTAL", "FREET4", "T3FREE", "THYROIDAB" in the last 72 hours. Anemia Panel: No results for input(s): "VITAMINB12", "FOLATE", "FERRITIN", "TIBC", "IRON", "RETICCTPCT" in the last 72 hours. Sepsis Labs: No results for input(s): "PROCALCITON", "LATICACIDVEN" in the last 168 hours.  No results found for this or any previous visit (from the past 240 hours).    Radiology Studies: ECHOCARDIOGRAM COMPLETE Result Date: 09/01/2023    ECHOCARDIOGRAM REPORT   Patient Name:   [name] Date of Exam: 09/01/2023 Medical Rec #:  [id number]       Height:       66.0 in Accession #:    [id number]      Weight:       436.0 lb Date of Birth:  26-Jul-1985       BSA:          2.785 m Patient Age:    37 years        BP:           143/98 mmHg Patient Gender: F               HR:           99 bpm. Exam Location:  Inpatient Procedure: 2D Echo, Color Doppler, Cardiac Doppler and Intracardiac            Opacification Agent (Both Spectral and Color Flow Doppler were            utilized during procedure). Indications:    CHF-Acute Systolic  History:        Patient has no prior history of Echocardiogram examinations.                 Signs/Symptoms:Shortness of Breath; Risk Factors:Hypertension.                 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  Sonographer:    [name] RDCS Referring Phys: [id number] [name]  Sonographer Comments: Patient is obese and Technically difficult study due to poor echo windows. IMPRESSIONS  1. Left ventricular ejection fraction, by estimation, is 55 to 60%. The left ventricle has normal  function. The left ventricle has no regional wall motion abnormalities. There is mild left ventricular hypertrophy. Left ventricular diastolic parameters are consistent with Grade I diastolic dysfunction (impaired relaxation). There is the interventricular septum is flattened in systole and diastole, consistent with right ventricular pressure and volume overload.  2. Right ventricular systolic  function is mildly reduced. The right ventricular size is mildly enlarged. There is severely elevated pulmonary artery systolic pressure.  3. Left atrial size was moderately dilated.  4. The mitral valve is normal in structure. No evidence of mitral valve regurgitation. No evidence of mitral stenosis.  5. Tricuspid valve regurgitation is moderate to severe.  6. The aortic valve is normal in structure. Aortic valve regurgitation is not visualized. No aortic stenosis is present.  7. The inferior vena cava is dilated in size with <50% respiratory variability, suggesting right atrial pressure of 15 mmHg. FINDINGS  Left Ventricle: Left ventricular ejection fraction, by estimation, is 55 to 60%. The left ventricle has normal function. The left ventricle has no regional wall motion abnormalities. The left ventricular internal cavity size was normal in size. There is  mild left ventricular hypertrophy. The interventricular septum is flattened in systole and diastole, consistent with right ventricular pressure and volume overload. Left ventricular diastolic parameters are consistent with Grade I diastolic dysfunction (impaired relaxation). Right Ventricle: The right ventricular size is mildly enlarged. No increase in right ventricular wall thickness. Right ventricular systolic function is mildly reduced. There is severely elevated pulmonary artery systolic pressure. The tricuspid regurgitant velocity is 4.02 m/s, and with an assumed right atrial pressure of 15 mmHg, the estimated right ventricular systolic pressure is 79.6 mmHg. Left  Atrium: Left atrial size was moderately dilated. Right Atrium: Right atrial size was normal in size. Pericardium: There is no evidence of pericardial effusion. Mitral Valve: The mitral valve is normal in structure. No evidence of mitral valve regurgitation. No evidence of mitral valve stenosis. Tricuspid Valve: The tricuspid valve is normal in structure. Tricuspid valve regurgitation is moderate to severe. No evidence of tricuspid stenosis. Aortic Valve: The aortic valve is normal in structure. Aortic valve regurgitation is not visualized. No aortic stenosis is present. Aortic valve mean gradient measures 3.0 mmHg. Aortic valve peak gradient measures 5.5 mmHg. Aortic valve area, by VTI measures 3.88 cm. Pulmonic Valve: The pulmonic valve was normal in structure. Pulmonic valve regurgitation is trivial. No evidence of pulmonic stenosis. Aorta: The aortic root is normal in size and structure. Venous: The inferior vena cava is dilated in size with less than 50% respiratory variability, suggesting right atrial pressure of 15 mmHg. IAS/Shunts: No atrial level shunt detected by color flow Doppler.  LEFT VENTRICLE PLAX 2D LVIDd:         4.70 cm      Diastology LVIDs:         3.10 cm      LV e' medial:    9.25 cm/s LV PW:         1.30 cm      LV E/e' medial:  6.8 LV IVS:        1.30 cm      LV e' lateral:   6.85 cm/s LVOT diam:     2.50 cm      LV E/e' lateral: 9.2 LV SV:         68 LV SV Index:   24 LVOT Area:     4.91 cm  LV Volumes (MOD) LV vol d, MOD A2C: 114.0 ml LV vol d, MOD A4C: 130.0 ml LV vol s, MOD A2C: 38.1 ml LV vol s, MOD A4C: 50.7 ml LV SV MOD A2C:     75.9 ml LV SV MOD A4C:     130.0 ml LV SV MOD BP:      84.3 ml RIGHT VENTRICLE RV  Basal diam:  4.40 cm RV Mid diam:    2.70 cm RV S prime:     10.00 cm/s TAPSE (M-mode): 1.1 cm LEFT ATRIUM              Index LA diam:        2.80 cm  1.01 cm/m LA Vol (A2C):   128.0 ml 45.96 ml/m LA Vol (A4C):   88.1 ml  31.63 ml/m LA Biplane Vol: 110.0 ml 39.50 ml/m   AORTIC VALVE AV Area (Vmax):    3.86 cm AV Area (Vmean):   3.88 cm AV Area (VTI):     3.88 cm AV Vmax:           117.00 cm/s AV Vmean:          74.800 cm/s AV VTI:            0.176 m AV Peak Grad:      5.5 mmHg AV Mean Grad:      3.0 mmHg LVOT Vmax:         91.90 cm/s LVOT Vmean:        59.100 cm/s LVOT VTI:          0.139 m LVOT/AV VTI ratio: 0.79  AORTA Ao Root diam: 3.60 cm Ao Asc diam:  3.30 cm MITRAL VALVE               TRICUSPID VALVE MV Area (PHT): 4.71 cm    TR Peak grad:   64.6 mmHg MV Decel Time: 161 msec    TR Vmax:        402.00 cm/s MV E velocity: 62.90 cm/s MV A velocity: 77.70 cm/s  SHUNTS MV E/A ratio:  0.81        Systemic VTI:  0.14 m                            Systemic Diam: 2.50 cm Dorothye Gathers MD Electronically signed by Dorothye Gathers MD Signature Date/Time: 09/01/2023/11:42:28 AM    Final    CT Angio Chest PE W and/or Wo Contrast Result Date: 08/31/2023 CLINICAL DATA:  Positive D-dimer and shortness of breath EXAM: CT ANGIOGRAPHY CHEST WITH CONTRAST TECHNIQUE: Multidetector CT imaging of the chest was performed using the standard protocol during bolus administration of intravenous contrast. Multiplanar CT image reconstructions and MIPs were obtained to evaluate the vascular anatomy. RADIATION DOSE REDUCTION: This exam was performed according to the departmental dose-optimization program which includes automated exposure control, adjustment of the mA and/or kV according to patient size and/or use of iterative reconstruction technique. CONTRAST:  75mL OMNIPAQUE  IOHEXOL  350 MG/ML SOLN COMPARISON:  Chest x-ray from earlier in the same day. FINDINGS: Cardiovascular: Thoracic aorta and its branches are within normal limits. Heart is mildly enlarged in size. Pulmonary artery shows no filling defect to suggest pulmonary embolism. Mediastinum/Nodes: Thoracic inlet is within normal limits. No hilar or mediastinal adenopathy is noted. The esophagus as visualized is within normal limits. Lungs/Pleura:  Lungs are well aerated bilaterally. No focal infiltrate or effusion is seen. No parenchymal nodules are seen. Upper Abdomen: Visualized upper abdomen is within normal limits. Musculoskeletal: No chest wall abnormality. No acute or significant osseous findings. Review of the MIP images confirms the above findings. IMPRESSION: No evidence of pulmonary embolism. No acute abnormality noted. Electronically Signed   By: Violeta Grey M.D.   On: 08/31/2023 23:25   DG Chest 2 View Result Date: 08/31/2023 CLINICAL DATA:  Dyspnea. Shortness of breath and nonproductive cough. EXAM: CHEST - 2 VIEW COMPARISON:  10/17/2021 FINDINGS: Cardiomegaly is unchanged. Mediastinal contours are stable. Mild central vascular congestion without pulmonary edema. No focal airspace disease. No pleural effusion or pneumothorax. None limited assessment, no acute osseous findings. IMPRESSION: Chronic cardiomegaly.  Mild central vascular congestion. Electronically Signed   By: Chadwick Colonel M.D.   On: 08/31/2023 20:49      Scheduled Meds:  enoxaparin  (LOVENOX ) injection  98 mg Subcutaneous Q24H   furosemide   40 mg Intravenous Q12H   methocarbamol   750 mg Oral QHS   sodium chloride  flush  3 mL Intravenous Q12H   Continuous Infusions:   LOS: 1 day   Time spent: 30 minutes   Daren Eck, DO [facility] 09/02/2023, 10:42 AM   Available via Epic secure chat 7am-7pm After these hours, please refer to coverage provider listed on [email or web address]

## 2023-09-02 NOTE — Plan of Care (Signed)

## 2023-09-03 DIAGNOSIS — I2489 Other forms of acute ischemic heart disease: Secondary | ICD-10-CM | POA: Insufficient documentation

## 2023-09-03 DIAGNOSIS — I5031 Acute diastolic (congestive) heart failure: Secondary | ICD-10-CM | POA: Diagnosis not present

## 2023-09-03 LAB — BASIC METABOLIC PANEL WITH GFR
Anion gap: 12 (ref 5–15)
BUN: 14 mg/dL (ref 6–20)
CO2: 34 mmol/L — ABNORMAL HIGH (ref 22–32)
Calcium: 9.1 mg/dL (ref 8.9–10.3)
Chloride: 93 mmol/L — ABNORMAL LOW (ref 98–111)
Creatinine, Ser: 0.91 mg/dL (ref 0.44–1.00)
GFR, Estimated: >60 mL/min (ref >60)
Glucose, Bld: 85 mg/dL (ref 70–99)
Potassium: 3.5 mmol/L (ref 3.5–5.1)
Sodium: 139 mmol/L (ref 135–145)

## 2023-09-03 LAB — HEMOGLOBIN A1C
Hgb A1c MFr Bld: 6.3 % — ABNORMAL HIGH (ref 4.8–5.6)
Mean Plasma Glucose: 134.11 mg/dL

## 2023-09-03 MED ORDER — FUROSEMIDE 10 MG/ML IJ SOLN
40.0000 mg | Freq: Two times a day (BID) | INTRAMUSCULAR | Status: DC
  Administered 2023-09-03 – 2023-09-08 (×11): 40 mg via INTRAVENOUS
  Filled 2023-09-03 (×11): qty 4

## 2023-09-03 MED ORDER — POTASSIUM CHLORIDE CRYS ER 20 MEQ PO TBCR
40.0000 meq | EXTENDED_RELEASE_TABLET | Freq: Once | ORAL | Status: AC
  Administered 2023-09-03: 40 meq via ORAL
  Filled 2023-09-03: qty 2

## 2023-09-03 MED ORDER — ENOXAPARIN SODIUM 100 MG/ML IJ SOSY
100.0000 mg | PREFILLED_SYRINGE | INTRAMUSCULAR | Status: DC
  Administered 2023-09-04 – 2023-09-06 (×3): 100 mg via SUBCUTANEOUS
  Filled 2023-09-03 (×3): qty 1

## 2023-09-03 NOTE — Plan of Care (Signed)

## 2023-09-03 NOTE — Progress Notes (Signed)
 PROGRESS NOTE    [name]  WJX:[id number] DOB: 01-06-1986 DOA: 08/31/2023 PCP: Ivin Marrow, MD     Brief Narrative:  [name] is a 38 y.o. female with medical history significant for childhood asthma, hypertension, and BMI 70 who presents with shortness of breath and sensation of heaviness involving the bilateral lower extremities.  She was found to have elevated BNP 632.  CTA chest negative for PE.  Chest x-ray revealed cardiomegaly and central vascular congestion.  Patient was started on IV Lasix .  Echocardiogram revealed EF 55%, grade 1 diastolic dysfunction.  New events last 24 hours / Subjective: I/O not documented correctly.  Continues to urinate well.  Remains volume overloaded.  Assessment & Plan:   Principal Problem:   Acute diastolic CHF (congestive heart failure) (HCC) Active Problems:   Obesity   Prediabetes   Acute respiratory failure with hypoxia (HCC)   Demand ischemia (HCC)  Acute diastolic CHF - Echocardiogram with normal EF 55 to 60%.  Grade 1 diastolic dysfunction.  Likely in setting of hypertension as well as obesity. - BNP 632.1 - Continue IV Lasix  - Strict I's and O's - Fluid restriction diet - Also with degree of lymphedema.  Apparently mother also deals with lymphedema  Demand ischemia - Troponin 19, 19  Prediabetes - Hemoglobin A1c 6.3  Obesity - Estimated body mass index is 71.1 kg/m as calculated from the following:   Height as of this encounter: 5\' 6"  (1.676 m).   Weight as of this encounter: 199.8 kg.   DVT prophylaxis: Lovenox    Code Status: Full Family Communication: Mom updated over the phone Disposition Plan: Home Status is: Inpatient Remains inpatient appropriate because: IV Lasix     Antimicrobials:  Anti-infectives (From admission, onward)    None        Objective: Vitals:   09/02/23 1109 09/02/23 2118 09/03/23 0431 09/03/23 0500  BP: (!) 132/97 112/70 115/86   Pulse: 89 (!) 106 89   Resp:  18 18    Temp: 98 F (36.7 C) 98.2 F (36.8 C) 98.1 F (36.7 C)   TempSrc: Oral Oral Oral   SpO2: 94% 96% 96%   Weight:    (!) 199.8 kg  Height:        Intake/Output Summary (Last 24 hours) at 09/03/2023 1109 Last data filed at 09/03/2023 0900 Gross per 24 hour  Intake 340 ml  Output 650 ml  Net -310 ml   Filed Weights   08/31/23 2012 09/02/23 0500 09/03/23 0500  Weight: (!) 197.8 kg (!) 197.8 kg (!) 199.8 kg    Examination:  General exam: Appears calm and comfortable  Respiratory system: Clear to auscultation. Respiratory effort normal. No respiratory distress. No conversational dyspnea.  Cardiovascular system: S1 & S2 heard, RRR. No murmurs. +Bilateral pedal edema. Gastrointestinal system: Abdomen is nondistended, soft and nontender. Normal bowel sounds heard. Central nervous system: Alert and oriented. No focal neurological deficits. Speech clear.  Extremities: Symmetric in appearance  Skin: No rashes, lesions or ulcers on exposed skin  Psychiatry: Judgement and insight appear normal. Mood & affect appropriate.   Data Reviewed: I have personally reviewed following labs and imaging studies  CBC: Recent Labs  Lab 08/31/23 2013 09/01/23 0708  WBC 5.1 6.5  NEUTROABS 2.3  --   HGB 13.1 13.3  HCT 43.3 44.0  MCV 93.1 91.9  PLT 181 172   Basic Metabolic Panel: Recent Labs  Lab 08/31/23 2013 09/01/23 0708 09/02/23 0345 09/03/23 0443  NA 137 138  137 139  K 4.5 4.3 4.0 3.5  CL 99 100 95* 93*  CO2 28 29 30  34*  GLUCOSE 116* 109* 123* 85  BUN 14 15 14 14   CREATININE 0.84 0.85 1.03* 0.91  CALCIUM 8.4* 8.7* 8.8* 9.1  MG  --  1.8  --   --    GFR: Estimated Creatinine Clearance: 154.3 mL/min (by C-G formula based on SCr of 0.91 mg/dL). Liver Function Tests: Recent Labs  Lab 08/31/23 2013 09/01/23 0708  AST 21 19  ALT 15 16  ALKPHOS 41 45  BILITOT 2.0* 1.0  PROT 7.2 7.0  ALBUMIN 3.4* 3.4*   No results for input(s): "LIPASE", "AMYLASE" in the last 168 hours. No  results for input(s): "AMMONIA" in the last 168 hours. Coagulation Profile: No results for input(s): "INR", "PROTIME" in the last 168 hours. Cardiac Enzymes: Recent Labs  Lab 08/31/23 2013  CKTOTAL 112   BNP (last 3 results) No results for input(s): "PROBNP" in the last 8760 hours. HbA1C: Recent Labs    09/03/23 0443  HGBA1C 6.3*   CBG: No results for input(s): "GLUCAP" in the last 168 hours. Lipid Profile: No results for input(s): "CHOL", "HDL", "LDLCALC", "TRIG", "CHOLHDL", "LDLDIRECT" in the last 72 hours. Thyroid  Function Tests: No results for input(s): "TSH", "T4TOTAL", "FREET4", "T3FREE", "THYROIDAB" in the last 72 hours. Anemia Panel: No results for input(s): "VITAMINB12", "FOLATE", "FERRITIN", "TIBC", "IRON", "RETICCTPCT" in the last 72 hours. Sepsis Labs: No results for input(s): "PROCALCITON", "LATICACIDVEN" in the last 168 hours.  No results found for this or any previous visit (from the past 240 hours).    Radiology Studies: No results found.     Scheduled Meds:  enoxaparin  (LOVENOX ) injection  98 mg Subcutaneous Q24H   furosemide   40 mg Intravenous BID   methocarbamol   750 mg Oral QHS   sodium chloride  flush  3 mL Intravenous Q12H   Continuous Infusions:   LOS: 2 days   Time spent: 30 minutes   Daren Eck, DO [facility] 09/03/2023, 11:09 AM   Available via Epic secure chat 7am-7pm After these hours, please refer to coverage provider listed on [email or web address]

## 2023-09-03 NOTE — Plan of Care (Signed)

## 2023-09-04 DIAGNOSIS — I5031 Acute diastolic (congestive) heart failure: Secondary | ICD-10-CM | POA: Diagnosis not present

## 2023-09-04 LAB — BASIC METABOLIC PANEL WITH GFR
Anion gap: 10 (ref 5–15)
BUN: 17 mg/dL (ref 6–20)
CO2: 32 mmol/L (ref 22–32)
Calcium: 8.9 mg/dL (ref 8.9–10.3)
Chloride: 93 mmol/L — ABNORMAL LOW (ref 98–111)
Creatinine, Ser: 0.88 mg/dL (ref 0.44–1.00)
GFR, Estimated: >60 mL/min (ref >60)
Glucose, Bld: 124 mg/dL — ABNORMAL HIGH (ref 70–99)
Potassium: 4 mmol/L (ref 3.5–5.1)
Sodium: 135 mmol/L (ref 135–145)

## 2023-09-04 MED ORDER — SENNOSIDES-DOCUSATE SODIUM 8.6-50 MG PO TABS: 1.0000 | ORAL_TABLET | Freq: Every evening | ORAL | Status: DC | PRN

## 2023-09-04 MED ORDER — METOPROLOL TARTRATE 5 MG/5ML IV SOLN: 5.0000 mg | INTRAVENOUS | Status: DC | PRN

## 2023-09-04 MED ORDER — GUAIFENESIN 100 MG/5ML PO LIQD
5.0000 mL | ORAL | Status: DC | PRN
  Administered 2023-09-05 – 2023-09-07 (×4): 5 mL via ORAL
  Filled 2023-09-04 (×4): qty 10

## 2023-09-04 MED ORDER — IPRATROPIUM-ALBUTEROL 0.5-2.5 (3) MG/3ML IN SOLN: 3.0000 mL | RESPIRATORY_TRACT | Status: DC | PRN

## 2023-09-04 MED ORDER — HYDRALAZINE HCL 20 MG/ML IJ SOLN: 10.0000 mg | INTRAMUSCULAR | Status: DC | PRN

## 2023-09-04 NOTE — Hospital Course (Addendum)
 Brief Narrative:  38 year old with history of childhood asthma, HTN, BMI greater than 70 comes to the hospital with shortness of breath and bilateral lower extremity heaviness.  CTA negative for PE, but there is signs of volume overload started on Lasix .  Echocardiogram showed EF of 55% with grade 1 DD.   Assessment & Plan:  Principal Problem:   Acute diastolic CHF (congestive heart failure) (HCC) Active Problems:   Obesity   Prediabetes   Acute respiratory failure with hypoxia (HCC)   Demand ischemia (HCC)     Acute diastolic CHF - Echocardiogram with normal EF 55 to 60%.  Grade 1 diastolic dysfunction.-This is in the setting of HTN and obesity.  Continue IV Lasix .  Lower extremity elevation.  Unna boots if necessary Received 2 days of Zaroxolyn   Demand ischemia -Remains flat, no evidence of ischemia   Prediabetes - Hemoglobin A1c 6.3, diet controlled   Obesity - Estimated body mass index is 71.1 kg/m as calculated from the following:   Height as of this encounter: 5\' 6"  (1.676 m).   Weight as of this encounter: 199.8 kg. Recommend outpatient follow-up with PCP/bariatric and potentially even try other weight loss drugs.  DVT prophylaxis: Lovenox     Code Status: Full Code Family Communication:   Status is: Inpatient Ongoing diuretics.  Hopefully discharge in next 1-2 days    Subjective: Had some cramping yesterday, shortness of breath improving this morning.  Still quite volume overloaded. Examination:  General exam: Appears calm and comfortable  Respiratory system: Clear to auscultation. Respiratory effort normal. Cardiovascular system: S1 & S2 heard, RRR. No JVD, murmurs, rubs, gallops or clicks.  3+ pedal lower extremity pitting edema Gastrointestinal system: Abdomen is nondistended, soft and nontender. No organomegaly or masses felt. Normal bowel sounds heard. Central nervous system: Alert and oriented. No focal neurological deficits. Extremities: Symmetric 5 x 5  power. Skin: No rashes, lesions or ulcers Psychiatry: Judgement and insight appear normal. Mood & affect appropriate.

## 2023-09-04 NOTE — Progress Notes (Signed)
 PROGRESS NOTE    [name]  ZOX:[id number] DOB: 09/18/1985 DOA: 08/31/2023 PCP: Ivin Marrow, MD    Brief Narrative:  38 year old with history of childhood asthma, HTN, BMI greater than 70 comes to the hospital with shortness of breath and bilateral lower extremity heaviness.  CTA negative for PE, but there is signs of volume overload started on Lasix .  Echocardiogram showed EF of 55% with grade 1 DD.   Assessment & Plan:  Principal Problem:   Acute diastolic CHF (congestive heart failure) (HCC) Active Problems:   Obesity   Prediabetes   Acute respiratory failure with hypoxia (HCC)   Demand ischemia (HCC)     Acute diastolic CHF - Echocardiogram with normal EF 55 to 60%.  Grade 1 diastolic dysfunction.-This is in the setting of HTN and obesity.  Continue IV Lasix .  Lower extremity elevation.  Unna boots if necessary   Demand ischemia -Remains flat, no evidence of ischemia   Prediabetes - Hemoglobin A1c 6.3, diet controlled   Obesity - Estimated body mass index is 71.1 kg/m as calculated from the following:   Height as of this encounter: 5\' 6"  (1.676 m).   Weight as of this encounter: 199.8 kg. Recommend outpatient follow-up with PCP/bariatric and potentially even try other weight loss drugs.  DVT prophylaxis: Lovenox     Code Status: Full Code Family Communication:   Status is: Inpatient Ongoing diuretics.  Hopefully discharge in next 1-2 days    Subjective: Seen at bedside, feels a little better but does have orthopnea.   Examination:  General exam: Appears calm and comfortable  Respiratory system: Clear to auscultation. Respiratory effort normal. Cardiovascular system: S1 & S2 heard, RRR. No JVD, murmurs, rubs, gallops or clicks.  3+ pedal lower extremity pitting edema Gastrointestinal system: Abdomen is nondistended, soft and nontender. No organomegaly or masses felt. Normal bowel sounds heard. Central nervous system: Alert and oriented. No focal  neurological deficits. Extremities: Symmetric 5 x 5 power. Skin: No rashes, lesions or ulcers Psychiatry: Judgement and insight appear normal. Mood & affect appropriate.                Diet Orders (From admission, onward)     Start     Ordered   09/01/23 0730  Diet Heart Room service appropriate? Yes; Fluid consistency: Thin; Fluid restriction: 2000 mL Fluid  Diet effective now       Question Answer Comment  Room service appropriate? Yes   Fluid consistency: Thin   Fluid restriction: 2000 mL Fluid      09/01/23 0729            Objective: Vitals:   09/03/23 1955 09/04/23 0406 09/04/23 0500 09/04/23 0909  BP: 131/81 109/61  109/60  Pulse: (!) 107   (!) 104  Resp: 16   16  Temp: 98.2 F (36.8 C) 98.4 F (36.9 C)  98.4 F (36.9 C)  TempSrc:  Oral  Oral  SpO2: 98% 95%  91%  Weight:   (!) 200.1 kg   Height:        Intake/Output Summary (Last 24 hours) at 09/04/2023 1222 Last data filed at 09/04/2023 1043 Gross per 24 hour  Intake 560 ml  Output 3800 ml  Net -3240 ml   Filed Weights   09/02/23 0500 09/03/23 0500 09/04/23 0500  Weight: (!) 197.8 kg (!) 199.8 kg (!) 200.1 kg    Scheduled Meds:  enoxaparin  (LOVENOX ) injection  100 mg Subcutaneous Q24H   furosemide   40 mg Intravenous BID  methocarbamol   750 mg Oral QHS   sodium chloride  flush  3 mL Intravenous Q12H   Continuous Infusions:  Nutritional status     Body mass index is 71.2 kg/m.  Data Reviewed:   CBC: Recent Labs  Lab 08/31/23 2013 09/01/23 0708  WBC 5.1 6.5  NEUTROABS 2.3  --   HGB 13.1 13.3  HCT 43.3 44.0  MCV 93.1 91.9  PLT 181 172   Basic Metabolic Panel: Recent Labs  Lab 08/31/23 2013 09/01/23 0708 09/02/23 0345 09/03/23 0443 09/04/23 0404  NA 137 138 137 139 135  K 4.5 4.3 4.0 3.5 4.0  CL 99 100 95* 93* 93*  CO2 28 29 30  34* 32  GLUCOSE 116* 109* 123* 85 124*  BUN 14 15 14 14 17   CREATININE 0.84 0.85 1.03* 0.91 0.88  CALCIUM 8.4* 8.7* 8.8* 9.1 8.9  MG  --   1.8  --   --   --    GFR: Estimated Creatinine Clearance: 159.7 mL/min (by C-G formula based on SCr of 0.88 mg/dL). Liver Function Tests: Recent Labs  Lab 08/31/23 2013 09/01/23 0708  AST 21 19  ALT 15 16  ALKPHOS 41 45  BILITOT 2.0* 1.0  PROT 7.2 7.0  ALBUMIN 3.4* 3.4*   No results for input(s): "LIPASE", "AMYLASE" in the last 168 hours. No results for input(s): "AMMONIA" in the last 168 hours. Coagulation Profile: No results for input(s): "INR", "PROTIME" in the last 168 hours. Cardiac Enzymes: Recent Labs  Lab 08/31/23 2013  CKTOTAL 112   BNP (last 3 results) No results for input(s): "PROBNP" in the last 8760 hours. HbA1C: Recent Labs    09/03/23 0443  HGBA1C 6.3*   CBG: No results for input(s): "GLUCAP" in the last 168 hours. Lipid Profile: No results for input(s): "CHOL", "HDL", "LDLCALC", "TRIG", "CHOLHDL", "LDLDIRECT" in the last 72 hours. Thyroid  Function Tests: No results for input(s): "TSH", "T4TOTAL", "FREET4", "T3FREE", "THYROIDAB" in the last 72 hours. Anemia Panel: No results for input(s): "VITAMINB12", "FOLATE", "FERRITIN", "TIBC", "IRON", "RETICCTPCT" in the last 72 hours. Sepsis Labs: No results for input(s): "PROCALCITON", "LATICACIDVEN" in the last 168 hours.  No results found for this or any previous visit (from the past 240 hours).       Radiology Studies: No results found.         LOS: 3 days   Time spent= 35 mins    Maggie Schooner, MD [facility]  If 7PM-7AM, please contact night-coverage  09/04/2023, 12:22 PM

## 2023-09-05 DIAGNOSIS — I5031 Acute diastolic (congestive) heart failure: Secondary | ICD-10-CM | POA: Diagnosis not present

## 2023-09-05 LAB — BASIC METABOLIC PANEL WITH GFR
Anion gap: 11 (ref 5–15)
BUN: 18 mg/dL (ref 6–20)
CO2: 32 mmol/L (ref 22–32)
Calcium: 8.8 mg/dL — ABNORMAL LOW (ref 8.9–10.3)
Chloride: 93 mmol/L — ABNORMAL LOW (ref 98–111)
Creatinine, Ser: 0.85 mg/dL (ref 0.44–1.00)
GFR, Estimated: >60 mL/min (ref >60)
Glucose, Bld: 120 mg/dL — ABNORMAL HIGH (ref 70–99)
Potassium: 3.7 mmol/L (ref 3.5–5.1)
Sodium: 136 mmol/L (ref 135–145)

## 2023-09-05 LAB — CBC
HCT: 44.9 % (ref 36.0–46.0)
Hemoglobin: 13.7 g/dL (ref 12.0–15.0)
MCH: 28.2 pg (ref 26.0–34.0)
MCHC: 30.5 g/dL (ref 30.0–36.0)
MCV: 92.6 fL (ref 80.0–100.0)
Platelets: 204 10*3/uL (ref 150–400)
RBC: 4.85 MIL/uL (ref 3.87–5.11)
RDW: 14.9 % (ref 11.5–15.5)
WBC: 8.8 10*3/uL (ref 4.0–10.5)
nRBC: 0 % (ref 0.0–0.2)

## 2023-09-05 LAB — MAGNESIUM: Magnesium: 1.8 mg/dL (ref 1.7–2.4)

## 2023-09-05 MED ORDER — MAGNESIUM OXIDE -MG SUPPLEMENT 400 (240 MG) MG PO TABS
800.0000 mg | ORAL_TABLET | Freq: Once | ORAL | Status: AC
  Administered 2023-09-05: 800 mg via ORAL
  Filled 2023-09-05: qty 2

## 2023-09-05 MED ORDER — POTASSIUM CHLORIDE CRYS ER 20 MEQ PO TBCR
40.0000 meq | EXTENDED_RELEASE_TABLET | Freq: Once | ORAL | Status: AC
  Administered 2023-09-05: 40 meq via ORAL
  Filled 2023-09-05: qty 2

## 2023-09-05 MED ORDER — METOLAZONE 5 MG PO TABS
5.0000 mg | ORAL_TABLET | Freq: Once | ORAL | Status: AC
  Administered 2023-09-05: 5 mg via ORAL
  Filled 2023-09-05: qty 1

## 2023-09-05 NOTE — Progress Notes (Signed)
 SATURATION QUALIFICATIONS: (This note is used to comply with regulatory documentation for home oxygen)  Patient Saturations on Room Air at Rest = 96-97%  Patient Saturations on Room Air while Ambulating = 88-93%  Please briefly explain why patient needs home oxygen:  Patient ambulated about 250 ft on room air. Patient tolerated walk and denied SOB. Saturations remained 93-94% on room air up until the end of the walk. Once patient was getting back to the room sats dropped to 87-88%. Patient got back, sat on edge of bed and sats improved immediately to 91-92%. Patient may benefit from home oxygen PRN or for walking long distances.

## 2023-09-05 NOTE — Progress Notes (Signed)
 PROGRESS NOTE    [name]  HQI:[id number] DOB: [birth year] DOA: 08/31/2023 PCP: Ivin Marrow, MD    Brief Narrative:  38 year old with history of childhood asthma, HTN, BMI greater than 70 comes to the hospital with shortness of breath and bilateral lower extremity heaviness.  CTA negative for PE, but there is signs of volume overload started on Lasix .  Echocardiogram showed EF of 55% with grade 1 DD.   Assessment & Plan:  Principal Problem:   Acute diastolic CHF (congestive heart failure) (HCC) Active Problems:   Obesity   Prediabetes   Acute respiratory failure with hypoxia (HCC)   Demand ischemia (HCC)     Acute diastolic CHF - Echocardiogram with normal EF 55 to 60%.  Grade 1 diastolic dysfunction.-This is in the setting of HTN and obesity.  Continue IV Lasix .  Lower extremity elevation.  Unna boots if necessary Will give her a dose of Zaroxolyn   Demand ischemia -Remains flat, no evidence of ischemia   Prediabetes - Hemoglobin A1c 6.3, diet controlled   Obesity - Estimated body mass index is 71.1 kg/m as calculated from the following:   Height as of this encounter: 5\' 6"  (1.676 m).   Weight as of this encounter: 199.8 kg. Recommend outpatient follow-up with PCP/bariatric and potentially even try other weight loss drugs.  DVT prophylaxis: Lovenox     Code Status: Full Code Family Communication:   Status is: Inpatient Ongoing diuretics.  Hopefully discharge in next 1-2 days    Subjective: Seen in bed side, able to walk halfway down the hall without getting short of breath.  She did get hypoxic down to 70% on room air with quick recovery.  Examination:  General exam: Appears calm and comfortable  Respiratory system: Clear to auscultation. Respiratory effort normal. Cardiovascular system: S1 & S2 heard, RRR. No JVD, murmurs, rubs, gallops or clicks.  3+ pedal lower extremity pitting edema Gastrointestinal system: Abdomen is nondistended, soft and  nontender. No organomegaly or masses felt. Normal bowel sounds heard. Central nervous system: Alert and oriented. No focal neurological deficits. Extremities: Symmetric 5 x 5 power. Skin: No rashes, lesions or ulcers Psychiatry: Judgement and insight appear normal. Mood & affect appropriate.                Diet Orders (From admission, onward)     Start     Ordered   09/01/23 0730  Diet Heart Room service appropriate? Yes; Fluid consistency: Thin; Fluid restriction: 2000 mL Fluid  Diet effective now       Question Answer Comment  Room service appropriate? Yes   Fluid consistency: Thin   Fluid restriction: 2000 mL Fluid      09/01/23 0729            Objective: Vitals:   09/04/23 2018 09/04/23 2300 09/05/23 0445 09/05/23 0500  BP: 134/80  100/76 117/74  Pulse: (!) 102     Resp: 18  20 20   Temp: 97.8 F (36.6 C)  98.8 F (37.1 C)   TempSrc: Oral  Oral   SpO2: 99% 97% 95% 97%  Weight:      Height:        Intake/Output Summary (Last 24 hours) at 09/05/2023 1053 Last data filed at 09/05/2023 0800 Gross per 24 hour  Intake 760 ml  Output 1650 ml  Net -890 ml   Filed Weights   09/02/23 0500 09/03/23 0500 09/04/23 0500  Weight: (!) 197.8 kg (!) 199.8 kg (!) 200.1 kg  Scheduled Meds:  enoxaparin  (LOVENOX ) injection  100 mg Subcutaneous Q24H   furosemide   40 mg Intravenous BID   methocarbamol   750 mg Oral QHS   metolazone  5 mg Oral Once   sodium chloride  flush  3 mL Intravenous Q12H   Continuous Infusions:  Nutritional status     Body mass index is 71.2 kg/m.  Data Reviewed:   CBC: Recent Labs  Lab 08/31/23 2013 09/01/23 0708 09/05/23 0405  WBC 5.1 6.5 8.8  NEUTROABS 2.3  --   --   HGB 13.1 13.3 13.7  HCT 43.3 44.0 44.9  MCV 93.1 91.9 92.6  PLT 181 172 204   Basic Metabolic Panel: Recent Labs  Lab 09/01/23 0708 09/02/23 0345 09/03/23 0443 09/04/23 0404 09/05/23 0405  NA 138 137 139 135 136  K 4.3 4.0 3.5 4.0 3.7  CL 100 95*  93* 93* 93*  CO2 29 30 34* 32 32  GLUCOSE 109* 123* 85 124* 120*  BUN 15 14 14 17 18   CREATININE 0.85 1.03* 0.91 0.88 0.85  CALCIUM 8.7* 8.8* 9.1 8.9 8.8*  MG 1.8  --   --   --  1.8   GFR: Estimated Creatinine Clearance: 165.4 mL/min (by C-G formula based on SCr of 0.85 mg/dL). Liver Function Tests: Recent Labs  Lab 08/31/23 2013 09/01/23 0708  AST 21 19  ALT 15 16  ALKPHOS 41 45  BILITOT 2.0* 1.0  PROT 7.2 7.0  ALBUMIN 3.4* 3.4*   No results for input(s): "LIPASE", "AMYLASE" in the last 168 hours. No results for input(s): "AMMONIA" in the last 168 hours. Coagulation Profile: No results for input(s): "INR", "PROTIME" in the last 168 hours. Cardiac Enzymes: Recent Labs  Lab 08/31/23 2013  CKTOTAL 112   BNP (last 3 results) No results for input(s): "PROBNP" in the last 8760 hours. HbA1C: Recent Labs    09/03/23 0443  HGBA1C 6.3*   CBG: No results for input(s): "GLUCAP" in the last 168 hours. Lipid Profile: No results for input(s): "CHOL", "HDL", "LDLCALC", "TRIG", "CHOLHDL", "LDLDIRECT" in the last 72 hours. Thyroid  Function Tests: No results for input(s): "TSH", "T4TOTAL", "FREET4", "T3FREE", "THYROIDAB" in the last 72 hours. Anemia Panel: No results for input(s): "VITAMINB12", "FOLATE", "FERRITIN", "TIBC", "IRON", "RETICCTPCT" in the last 72 hours. Sepsis Labs: No results for input(s): "PROCALCITON", "LATICACIDVEN" in the last 168 hours.  No results found for this or any previous visit (from the past 240 hours).       Radiology Studies: No results found.         LOS: 4 days   Time spent= 35 mins    Maggie Schooner, MD [facility]  If 7PM-7AM, please contact night-coverage  09/05/2023, 10:53 AM

## 2023-09-05 NOTE — Progress Notes (Signed)
 SATURATION QUALIFICATIONS: (This note is used to comply with regulatory documentation for home oxygen)  Patient Saturations on Room Air at Rest = 97%  Patient Saturations on Room Air while Ambulating = 88-90%  Please briefly explain why patient needs home oxygen:   Patient ambulated about 360 ft on room air. Patient tolerated walk and denied SOB. Saturations would drop to high 70's on occasion, patient informed to stop, stay standing and take deep breath. Saturations would immediately jump back up to the 90's. Patient said she was not that SOB when sats would drop. Patient ambulated back to room w/ sats fluctuating between 88-90% on room air. Once sitting in bed at rest, sats 94% on room air. Will retest again in afternoon.

## 2023-09-06 DIAGNOSIS — I5031 Acute diastolic (congestive) heart failure: Secondary | ICD-10-CM | POA: Diagnosis not present

## 2023-09-06 LAB — CBC
HCT: 47.8 % — ABNORMAL HIGH (ref 36.0–46.0)
Hemoglobin: 14.5 g/dL (ref 12.0–15.0)
MCH: 27.9 pg (ref 26.0–34.0)
MCHC: 30.3 g/dL (ref 30.0–36.0)
MCV: 92.1 fL (ref 80.0–100.0)
Platelets: 223 10*3/uL (ref 150–400)
RBC: 5.19 MIL/uL — ABNORMAL HIGH (ref 3.87–5.11)
RDW: 15 % (ref 11.5–15.5)
WBC: 7.8 10*3/uL (ref 4.0–10.5)
nRBC: 0 % (ref 0.0–0.2)

## 2023-09-06 LAB — BASIC METABOLIC PANEL WITH GFR
Anion gap: 10 (ref 5–15)
BUN: 18 mg/dL (ref 6–20)
CO2: 33 mmol/L — ABNORMAL HIGH (ref 22–32)
Calcium: 9.1 mg/dL (ref 8.9–10.3)
Chloride: 92 mmol/L — ABNORMAL LOW (ref 98–111)
Creatinine, Ser: 0.8 mg/dL (ref 0.44–1.00)
GFR, Estimated: >60 mL/min (ref >60)
Glucose, Bld: 120 mg/dL — ABNORMAL HIGH (ref 70–99)
Potassium: 4 mmol/L (ref 3.5–5.1)
Sodium: 135 mmol/L (ref 135–145)

## 2023-09-06 LAB — MAGNESIUM: Magnesium: 2 mg/dL (ref 1.7–2.4)

## 2023-09-06 MED ORDER — ENOXAPARIN SODIUM 100 MG/ML IJ SOSY
90.0000 mg | PREFILLED_SYRINGE | INTRAMUSCULAR | Status: DC
  Administered 2023-09-07: 90 mg via SUBCUTANEOUS
  Filled 2023-09-06: qty 1

## 2023-09-06 MED ORDER — METOLAZONE 5 MG PO TABS
5.0000 mg | ORAL_TABLET | Freq: Once | ORAL | Status: DC
  Filled 2023-09-06: qty 1

## 2023-09-06 MED ORDER — METOLAZONE 5 MG PO TABS
5.0000 mg | ORAL_TABLET | Freq: Once | ORAL | Status: AC
  Administered 2023-09-06: 5 mg via ORAL
  Filled 2023-09-06: qty 1

## 2023-09-06 NOTE — Progress Notes (Signed)
 PROGRESS NOTE    [name]  ZOX:[id number] DOB: [birth year] DOA: 08/31/2023 PCP: Ivin Marrow, MD    Brief Narrative:  38 year old with history of childhood asthma, HTN, BMI greater than 70 comes to the hospital with shortness of breath and bilateral lower extremity heaviness.  CTA negative for PE, but there is signs of volume overload started on Lasix .  Echocardiogram showed EF of 55% with grade 1 DD.   Assessment & Plan:  Principal Problem:   Acute diastolic CHF (congestive heart failure) (HCC) Active Problems:   Obesity   Prediabetes   Acute respiratory failure with hypoxia (HCC)   Demand ischemia (HCC)     Acute diastolic CHF - Echocardiogram with normal EF 55 to 60%.  Grade 1 diastolic dysfunction.-This is in the setting of HTN and obesity.  Continue IV Lasix .  Lower extremity elevation.  Unna boots if necessary Will do another dose of Zaroxolyn   Demand ischemia -Remains flat, no evidence of ischemia   Prediabetes - Hemoglobin A1c 6.3, diet controlled   Obesity - Estimated body mass index is 71.1 kg/m as calculated from the following:   Height as of this encounter: 5\' 6"  (1.676 m).   Weight as of this encounter: 199.8 kg. Recommend outpatient follow-up with PCP/bariatric and potentially even try other weight loss drugs.  DVT prophylaxis: Lovenox     Code Status: Full Code Family Communication:   Status is: Inpatient Ongoing diuretics.  Hopefully discharge in next 1-2 days    Subjective: Feels better, responded well to Zaroxolyn yesterday.  Able to ambulate lipid further  Examination:  General exam: Appears calm and comfortable  Respiratory system: Clear to auscultation. Respiratory effort normal. Cardiovascular system: S1 & S2 heard, RRR. No JVD, murmurs, rubs, gallops or clicks.  3+ pedal lower extremity pitting edema Gastrointestinal system: Abdomen is nondistended, soft and nontender. No organomegaly or masses felt. Normal bowel sounds  heard. Central nervous system: Alert and oriented. No focal neurological deficits. Extremities: Symmetric 5 x 5 power. Skin: No rashes, lesions or ulcers Psychiatry: Judgement and insight appear normal. Mood & affect appropriate.                Diet Orders (From admission, onward)     Start     Ordered   09/01/23 0730  Diet Heart Room service appropriate? Yes; Fluid consistency: Thin; Fluid restriction: 2000 mL Fluid  Diet effective now       Question Answer Comment  Room service appropriate? Yes   Fluid consistency: Thin   Fluid restriction: 2000 mL Fluid      09/01/23 0729            Objective: Vitals:   09/05/23 1803 09/05/23 1816 09/05/23 2021 09/06/23 0622  BP:  135/79 117/73 (!) 115/46  Pulse:  (!) 105 (!) 106 (!) 107  Resp:  20 18 17   Temp:  97.8 F (36.6 C) 99 F (37.2 C) 98.8 F (37.1 C)  TempSrc:      SpO2:  100% 99% 94%  Weight: (!) 184.7 kg     Height:        Intake/Output Summary (Last 24 hours) at 09/06/2023 1112 Last data filed at 09/06/2023 0900 Gross per 24 hour  Intake 840 ml  Output 2350 ml  Net -1510 ml   Filed Weights   09/03/23 0500 09/04/23 0500 09/05/23 1803  Weight: (!) 199.8 kg (!) 200.1 kg (!) 184.7 kg    Scheduled Meds:  enoxaparin  (LOVENOX ) injection  100 mg Subcutaneous  Q24H   furosemide   40 mg Intravenous BID   methocarbamol   750 mg Oral QHS   metolazone  5 mg Oral Once   sodium chloride  flush  3 mL Intravenous Q12H   Continuous Infusions:  Nutritional status     Body mass index is 65.71 kg/m.  Data Reviewed:   CBC: Recent Labs  Lab 08/31/23 2013 09/01/23 0708 09/05/23 0405 09/06/23 0341  WBC 5.1 6.5 8.8 7.8  NEUTROABS 2.3  --   --   --   HGB 13.1 13.3 13.7 14.5  HCT 43.3 44.0 44.9 47.8*  MCV 93.1 91.9 92.6 92.1  PLT 181 172 204 223   Basic Metabolic Panel: Recent Labs  Lab 09/01/23 0708 09/02/23 0345 09/03/23 0443 09/04/23 0404 09/05/23 0405 09/06/23 0341  NA 138 137 139 135 136 135   K 4.3 4.0 3.5 4.0 3.7 4.0  CL 100 95* 93* 93* 93* 92*  CO2 29 30 34* 32 32 33*  GLUCOSE 109* 123* 85 124* 120* 120*  BUN 15 14 14 17 18 18   CREATININE 0.85 1.03* 0.91 0.88 0.85 0.80  CALCIUM 8.7* 8.8* 9.1 8.9 8.8* 9.1  MG 1.8  --   --   --  1.8 2.0   GFR: Estimated Creatinine Clearance: 166.4 mL/min (by C-G formula based on SCr of 0.8 mg/dL). Liver Function Tests: Recent Labs  Lab 08/31/23 2013 09/01/23 0708  AST 21 19  ALT 15 16  ALKPHOS 41 45  BILITOT 2.0* 1.0  PROT 7.2 7.0  ALBUMIN 3.4* 3.4*   No results for input(s): "LIPASE", "AMYLASE" in the last 168 hours. No results for input(s): "AMMONIA" in the last 168 hours. Coagulation Profile: No results for input(s): "INR", "PROTIME" in the last 168 hours. Cardiac Enzymes: Recent Labs  Lab 08/31/23 2013  CKTOTAL 112   BNP (last 3 results) No results for input(s): "PROBNP" in the last 8760 hours. HbA1C: No results for input(s): "HGBA1C" in the last 72 hours. CBG: No results for input(s): "GLUCAP" in the last 168 hours. Lipid Profile: No results for input(s): "CHOL", "HDL", "LDLCALC", "TRIG", "CHOLHDL", "LDLDIRECT" in the last 72 hours. Thyroid  Function Tests: No results for input(s): "TSH", "T4TOTAL", "FREET4", "T3FREE", "THYROIDAB" in the last 72 hours. Anemia Panel: No results for input(s): "VITAMINB12", "FOLATE", "FERRITIN", "TIBC", "IRON", "RETICCTPCT" in the last 72 hours. Sepsis Labs: No results for input(s): "PROCALCITON", "LATICACIDVEN" in the last 168 hours.  No results found for this or any previous visit (from the past 240 hours).       Radiology Studies: No results found.         LOS: 5 days   Time spent= 35 mins    Maggie Schooner, MD [facility]  If 7PM-7AM, please contact night-coverage  09/06/2023, 11:12 AM

## 2023-09-07 DIAGNOSIS — I5031 Acute diastolic (congestive) heart failure: Secondary | ICD-10-CM | POA: Diagnosis not present

## 2023-09-07 LAB — BASIC METABOLIC PANEL WITH GFR
Anion gap: 10 (ref 5–15)
BUN: 28 mg/dL — ABNORMAL HIGH (ref 6–20)
CO2: 33 mmol/L — ABNORMAL HIGH (ref 22–32)
Calcium: 9 mg/dL (ref 8.9–10.3)
Chloride: 88 mmol/L — ABNORMAL LOW (ref 98–111)
Creatinine, Ser: 0.97 mg/dL (ref 0.44–1.00)
GFR, Estimated: >60 mL/min (ref >60)
Glucose, Bld: 130 mg/dL — ABNORMAL HIGH (ref 70–99)
Potassium: 4 mmol/L (ref 3.5–5.1)
Sodium: 131 mmol/L — ABNORMAL LOW (ref 135–145)

## 2023-09-07 LAB — CBC
HCT: 48.4 % — ABNORMAL HIGH (ref 36.0–46.0)
Hemoglobin: 14.6 g/dL (ref 12.0–15.0)
MCH: 27.3 pg (ref 26.0–34.0)
MCHC: 30.2 g/dL (ref 30.0–36.0)
MCV: 90.6 fL (ref 80.0–100.0)
Platelets: 231 10*3/uL (ref 150–400)
RBC: 5.34 MIL/uL — ABNORMAL HIGH (ref 3.87–5.11)
RDW: 14.9 % (ref 11.5–15.5)
WBC: 6.7 10*3/uL (ref 4.0–10.5)
nRBC: 0 % (ref 0.0–0.2)

## 2023-09-07 LAB — MAGNESIUM: Magnesium: 2.1 mg/dL (ref 1.7–2.4)

## 2023-09-07 NOTE — Plan of Care (Signed)
  Problem: Education: Goal: Knowledge of General Education information will improve Description: Including pain rating scale, medication(s)/side effects and non-pharmacologic comfort measures Outcome: Progressing   Problem: Health Behavior/Discharge Planning: Goal: Ability to manage health-related needs will improve Outcome: Progressing   Problem: Clinical Measurements: Goal: Ability to maintain clinical measurements within normal limits will improve Outcome: Progressing Goal: Will remain free from infection Outcome: Progressing Goal: Respiratory complications will improve Outcome: Progressing Goal: Cardiovascular complication will be avoided Outcome: Progressing   Problem: Activity: Goal: Risk for activity intolerance will decrease Outcome: Progressing   Problem: Nutrition: Goal: Adequate nutrition will be maintained Outcome: Progressing   Problem: Elimination: Goal: Will not experience complications related to bowel motility Outcome: Progressing Goal: Will not experience complications related to urinary retention Outcome: Progressing   Problem: Pain Managment: Goal: General experience of comfort will improve and/or be controlled Outcome: Not Progressing   Problem: Skin Integrity: Goal: Risk for impaired skin integrity will decrease Outcome: Not Progressing

## 2023-09-07 NOTE — Progress Notes (Signed)
 PROGRESS NOTE    [name]  ION:[id number] DOB: Apr 20, 1985 DOA: 08/31/2023 PCP: Ivin Marrow, MD    Brief Narrative:  38 year old with history of childhood asthma, HTN, BMI greater than 70 comes to the hospital with shortness of breath and bilateral lower extremity heaviness.  CTA negative for PE, but there is signs of volume overload started on Lasix .  Echocardiogram showed EF of 55% with grade 1 DD.   Assessment & Plan:  Principal Problem:   Acute diastolic CHF (congestive heart failure) (HCC) Active Problems:   Obesity   Prediabetes   Acute respiratory failure with hypoxia (HCC)   Demand ischemia (HCC)     Acute diastolic CHF - Echocardiogram with normal EF 55 to 60%.  Grade 1 diastolic dysfunction.-This is in the setting of HTN and obesity.  Continue IV Lasix .  Lower extremity elevation.  Unna boots if necessary Received 2 days of Zaroxolyn   Demand ischemia -Remains flat, no evidence of ischemia   Prediabetes - Hemoglobin A1c 6.3, diet controlled   Obesity - Estimated body mass index is 71.1 kg/m as calculated from the following:   Height as of this encounter: 5\' 6"  (1.676 m).   Weight as of this encounter: 199.8 kg. Recommend outpatient follow-up with PCP/bariatric and potentially even try other weight loss drugs.  DVT prophylaxis: Lovenox     Code Status: Full Code Family Communication:   Status is: Inpatient Ongoing diuretics.  Hopefully discharge in next 1-2 days    Subjective: Had some cramping yesterday, shortness of breath improving this morning.  Still quite volume overloaded. Examination:  General exam: Appears calm and comfortable  Respiratory system: Clear to auscultation. Respiratory effort normal. Cardiovascular system: S1 & S2 heard, RRR. No JVD, murmurs, rubs, gallops or clicks.  3+ pedal lower extremity pitting edema Gastrointestinal system: Abdomen is nondistended, soft and nontender. No organomegaly or masses felt. Normal bowel  sounds heard. Central nervous system: Alert and oriented. No focal neurological deficits. Extremities: Symmetric 5 x 5 power. Skin: No rashes, lesions or ulcers Psychiatry: Judgement and insight appear normal. Mood & affect appropriate.                Diet Orders (From admission, onward)     Start     Ordered   09/01/23 0730  Diet Heart Room service appropriate? Yes; Fluid consistency: Thin; Fluid restriction: 2000 mL Fluid  Diet effective now       Question Answer Comment  Room service appropriate? Yes   Fluid consistency: Thin   Fluid restriction: 2000 mL Fluid      09/01/23 0729            Objective: Vitals:   09/06/23 2208 09/07/23 0228 09/07/23 0500 09/07/23 1209  BP:  131/72  118/62  Pulse:  (!) 103  (!) 105  Resp: 19   14  Temp:  (!) 97.3 F (36.3 C)  98.7 F (37.1 C)  TempSrc:  Oral  Oral  SpO2:  (!) 89%  100%  Weight:   (!) 182.1 kg   Height:        Intake/Output Summary (Last 24 hours) at 09/07/2023 1220 Last data filed at 09/06/2023 1600 Gross per 24 hour  Intake 240 ml  Output 1150 ml  Net -910 ml   Filed Weights   09/05/23 1803 09/06/23 1613 09/07/23 0500  Weight: (!) 184.7 kg (!) 182.7 kg (!) 182.1 kg    Scheduled Meds:  enoxaparin  (LOVENOX ) injection  90 mg Subcutaneous Q24H  furosemide   40 mg Intravenous BID   methocarbamol   750 mg Oral QHS   sodium chloride  flush  3 mL Intravenous Q12H   Continuous Infusions:  Nutritional status     Body mass index is 64.8 kg/m.  Data Reviewed:   CBC: Recent Labs  Lab 08/31/23 2013 09/01/23 0708 09/05/23 0405 09/06/23 0341 09/07/23 0427  WBC 5.1 6.5 8.8 7.8 6.7  NEUTROABS 2.3  --   --   --   --   HGB 13.1 13.3 13.7 14.5 14.6  HCT 43.3 44.0 44.9 47.8* 48.4*  MCV 93.1 91.9 92.6 92.1 90.6  PLT 181 172 204 223 231   Basic Metabolic Panel: Recent Labs  Lab 09/01/23 0708 09/02/23 0345 09/03/23 0443 09/04/23 0404 09/05/23 0405 09/06/23 0341 09/07/23 0427  NA 138   < > 139  135 136 135 131*  K 4.3   < > 3.5 4.0 3.7 4.0 4.0  CL 100   < > 93* 93* 93* 92* 88*  CO2 29   < > 34* 32 32 33* 33*  GLUCOSE 109*   < > 85 124* 120* 120* 130*  BUN 15   < > 14 17 18 18  28*  CREATININE 0.85   < > 0.91 0.88 0.85 0.80 0.97  CALCIUM 8.7*   < > 9.1 8.9 8.8* 9.1 9.0  MG 1.8  --   --   --  1.8 2.0 2.1   < > = values in this interval not displayed.   GFR: Estimated Creatinine Clearance: 135.9 mL/min (by C-G formula based on SCr of 0.97 mg/dL). Liver Function Tests: Recent Labs  Lab 08/31/23 2013 09/01/23 0708  AST 21 19  ALT 15 16  ALKPHOS 41 45  BILITOT 2.0* 1.0  PROT 7.2 7.0  ALBUMIN 3.4* 3.4*   No results for input(s): "LIPASE", "AMYLASE" in the last 168 hours. No results for input(s): "AMMONIA" in the last 168 hours. Coagulation Profile: No results for input(s): "INR", "PROTIME" in the last 168 hours. Cardiac Enzymes: Recent Labs  Lab 08/31/23 2013  CKTOTAL 112   BNP (last 3 results) No results for input(s): "PROBNP" in the last 8760 hours. HbA1C: No results for input(s): "HGBA1C" in the last 72 hours. CBG: No results for input(s): "GLUCAP" in the last 168 hours. Lipid Profile: No results for input(s): "CHOL", "HDL", "LDLCALC", "TRIG", "CHOLHDL", "LDLDIRECT" in the last 72 hours. Thyroid  Function Tests: No results for input(s): "TSH", "T4TOTAL", "FREET4", "T3FREE", "THYROIDAB" in the last 72 hours. Anemia Panel: No results for input(s): "VITAMINB12", "FOLATE", "FERRITIN", "TIBC", "IRON", "RETICCTPCT" in the last 72 hours. Sepsis Labs: No results for input(s): "PROCALCITON", "LATICACIDVEN" in the last 168 hours.  No results found for this or any previous visit (from the past 240 hours).       Radiology Studies: No results found.         LOS: 6 days   Time spent= 35 mins    Maggie Schooner, MD [facility]  If 7PM-7AM, please contact night-coverage  09/07/2023, 12:20 PM

## 2023-09-08 DIAGNOSIS — I5031 Acute diastolic (congestive) heart failure: Secondary | ICD-10-CM | POA: Diagnosis not present

## 2023-09-08 LAB — BASIC METABOLIC PANEL WITH GFR
Anion gap: 16 — ABNORMAL HIGH (ref 5–15)
BUN: 29 mg/dL — ABNORMAL HIGH (ref 6–20)
CO2: 29 mmol/L (ref 22–32)
Calcium: 9.1 mg/dL (ref 8.9–10.3)
Chloride: 88 mmol/L — ABNORMAL LOW (ref 98–111)
Creatinine, Ser: 1.13 mg/dL — ABNORMAL HIGH (ref 0.44–1.00)
GFR, Estimated: >60 mL/min (ref >60)
Glucose, Bld: 100 mg/dL — ABNORMAL HIGH (ref 70–99)
Potassium: 3.7 mmol/L (ref 3.5–5.1)
Sodium: 133 mmol/L — ABNORMAL LOW (ref 135–145)

## 2023-09-08 LAB — CBC
HCT: 46.8 % — ABNORMAL HIGH (ref 36.0–46.0)
Hemoglobin: 14.2 g/dL (ref 12.0–15.0)
MCH: 27 pg (ref 26.0–34.0)
MCHC: 30.3 g/dL (ref 30.0–36.0)
MCV: 89 fL (ref 80.0–100.0)
Platelets: 221 10*3/uL (ref 150–400)
RBC: 5.26 MIL/uL — ABNORMAL HIGH (ref 3.87–5.11)
RDW: 14.9 % (ref 11.5–15.5)
WBC: 5.3 10*3/uL (ref 4.0–10.5)
nRBC: 0 % (ref 0.0–0.2)

## 2023-09-08 LAB — MAGNESIUM: Magnesium: 2 mg/dL (ref 1.7–2.4)

## 2023-09-08 MED ORDER — POTASSIUM CHLORIDE CRYS ER 20 MEQ PO TBCR
40.0000 meq | EXTENDED_RELEASE_TABLET | Freq: Once | ORAL | Status: AC
  Administered 2023-09-08: 40 meq via ORAL
  Filled 2023-09-08: qty 2

## 2023-09-08 MED ORDER — FUROSEMIDE 40 MG PO TABS: 80.0000 mg | ORAL_TABLET | Freq: Every day | ORAL | 0 refills | Status: DC

## 2023-09-08 MED ORDER — POTASSIUM CHLORIDE CRYS ER 20 MEQ PO TBCR: 20.0000 meq | EXTENDED_RELEASE_TABLET | Freq: Every day | ORAL | 0 refills | Status: DC

## 2023-09-08 MED ORDER — METHOCARBAMOL 500 MG PO TABS
500.0000 mg | ORAL_TABLET | Freq: Three times a day (TID) | ORAL | Status: DC | PRN
  Administered 2023-09-08: 500 mg via ORAL
  Filled 2023-09-08: qty 1

## 2023-09-08 NOTE — Discharge Summary (Signed)
 Physician Discharge Summary  BRIGETT ESTELL EAV:[id number] DOB: 19-Dec-1985 DOA: 08/31/2023  PCP: Ivin Marrow, MD  Admit date: 08/31/2023 Discharge date: 09/08/2023  Admitted From: Home Disposition: Home  Recommendations for Outpatient Follow-up:  Follow up with PCP in 1-2 weeks Please obtain BMP and magnesium in next 3-5 days Lasix  80 mg daily along with potassium supplements prescribed. Advised fluid restriction and low-salt diet/heart healthy diet.  Discharge Condition: Stable CODE STATUS: Full code Diet recommendation: Heart healthy/low salt  Brief/Interim Summary: Brief Narrative:  38 year old with history of childhood asthma, HTN, BMI greater than 70 comes to the hospital with shortness of breath and bilateral lower extremity heaviness.  CTA negative for PE, but there is signs of volume overload started on Lasix .  Echocardiogram showed EF of 55% with grade 1 DD.  During the hospitalization patient was aggressively diuresed and was close to 10 L negative.  She had lost about 36 pounds worth of fluid.  Today she is feeling significantly well.  There is a slight bump in creatinine therefore we will back off on diuretics.  Will medically discharge her in stable condition.  She will be given Lasix  80 mg to be taken daily with potassium supplements.  She will need to follow-up outpatient with PCP next 3-5 days for repeat lab work and for any further adjustments in diuretics.   Assessment & Plan:  Principal Problem:   Acute diastolic CHF (congestive heart failure) (HCC) Active Problems:   Obesity   Prediabetes   Acute respiratory failure with hypoxia (HCC)   Demand ischemia (HCC)     Acute diastolic CHF - Echocardiogram with normal EF 55 to 60%.  Grade 1 diastolic dysfunction. During the hospitalization patient was aggressively diuresed and was close to 10 L negative.  She had lost about 36 pounds worth of fluid.  Today she is feeling significantly well.  There is a slight bump  in creatinine therefore we will back off on diuretics.  Will medically discharge her in stable condition.  She will be given Lasix  80 mg to be taken daily with potassium supplements.  She will need to follow-up outpatient with PCP next 3-5 days for repeat lab work and for any further adjustments in diuretics.  Advised to check daily weight.   Demand ischemia -Remains flat, no evidence of ischemia   Prediabetes - Hemoglobin A1c 6.3, diet controlled   Obesity - Estimated body mass index is 71.1 kg/m as calculated from the following:   Height as of this encounter: 5\' 6"  (1.676 m).   Weight as of this encounter: 199.8 kg. Recommend outpatient follow-up with PCP/bariatric and potentially even try other weight loss drugs.  DVT prophylaxis: Lovenox     Code Status: Full Code Family Communication: Mother updated Status is: Inpatient Discharge today   Subjective: Doing well, had some cramps last night.  Wishing to go home today. Examination:  General exam: Appears calm and comfortable  Respiratory system: Clear to auscultation. Respiratory effort normal. Cardiovascular system: S1 & S2 heard, RRR. No JVD, murmurs, rubs, gallops or clicks.  3+ pedal lower extremity pitting edema Gastrointestinal system: Abdomen is nondistended, soft and nontender. No organomegaly or masses felt. Normal bowel sounds heard. Central nervous system: Alert and oriented. No focal neurological deficits. Extremities: Symmetric 5 x 5 power. Skin: No rashes, lesions or ulcers Psychiatry: Judgement and insight appear normal. Mood & affect appropriate.    Discharge Diagnoses:  Principal Problem:   Acute diastolic CHF (congestive heart failure) (HCC) Active Problems:   Obesity  Prediabetes   Acute respiratory failure with hypoxia [facility])   Demand ischemia [facility])      Discharge Exam: Vitals:   09/07/23 2026 09/08/23 0534  BP: 119/81 111/61  Pulse: (!) 103 (!) 110  Resp:    Temp: 98.4 F (36.9 C) 97.9 F  (36.6 C)  SpO2: 99% 93%   Vitals:   09/07/23 0500 09/07/23 1209 09/07/23 2026 09/08/23 0534  BP:  118/62 119/81 111/61  Pulse:  (!) 105 (!) 103 (!) 110  Resp:  14    Temp:  98.7 F (37.1 C) 98.4 F (36.9 C) 97.9 F (36.6 C)  TempSrc:  Oral Oral Oral  SpO2:  100% 99% 93%  Weight: (!) 182.1 kg   (!) 181 kg  Height:          Discharge Instructions   Allergies as of 09/08/2023       Reactions   Tramadol  Other (See Comments)   Did not make her feel like herself        Medication List     TAKE these medications    albuterol  108 (90 Base) MCG/ACT inhaler Commonly known as: VENTOLIN  HFA Inhale 2 puffs into the lungs every 6 (six) hours as needed for wheezing or shortness of breath.   Cholecalciferol  1.25 MG (50000 UT) Tabs Take 1 tablet by mouth once a week. For 8 weeks   furosemide  40 MG tablet Commonly known as: Lasix  Take 2 tablets (80 mg total) by mouth daily.   methocarbamol  750 MG tablet Commonly known as: ROBAXIN  Take 750 mg by mouth at bedtime.   multivitamin tablet Take 2 tablets by mouth daily. Vitafusion gummies   potassium chloride  SA 20 MEQ tablet Commonly known as: KLOR-CON  M Take 1 tablet (20 mEq total) by mouth daily.        Follow-up Information     Ivin Marrow, MD Follow up in 1 week(s).   Specialty: Family Medicine Contact information: [address] [place] [address] [phone]                Allergies  Allergen Reactions   Tramadol  Other (See Comments)    Did not make her feel like herself    You were cared for by a hospitalist during your hospital stay. If you have any questions about your discharge medications or the care you received while you were in the hospital after you are discharged, you can call the unit and asked to speak with the hospitalist on call if the hospitalist that took care of you is not available. Once you are discharged, your primary care physician will handle any further medical  issues. Please note that no refills for any discharge medications will be authorized once you are discharged, as it is imperative that you return to your primary care physician (or establish a relationship with a primary care physician if you do not have one) for your aftercare needs so that they can reassess your need for medications and monitor your lab values.  You were cared for by a hospitalist during your hospital stay. If you have any questions about your discharge medications or the care you received while you were in the hospital after you are discharged, you can call the unit and asked to speak with the hospitalist on call if the hospitalist that took care of you is not available. Once you are discharged, your primary care physician will handle any further medical issues. Please note that NO REFILLS for any discharge medications  will be authorized once you are discharged, as it is imperative that you return to your primary care physician (or establish a relationship with a primary care physician if you do not have one) for your aftercare needs so that they can reassess your need for medications and monitor your lab values.  Please request your Prim.MD to go over all Hospital Tests and Procedure/Radiological results at the follow up, please get all Hospital records sent to your Prim MD by signing hospital release before you go home.  Get CBC, CMP, 2 view Chest X ray checked  by Primary MD during your next visit or SNF MD in 5-7 days ( we routinely change or add medications that can affect your baseline labs and fluid status, therefore we recommend that you get the mentioned basic workup next visit with your PCP, your PCP may decide not to get them or add new tests based on their clinical decision)  On your next visit with your primary care physician please Get Medicines reviewed and adjusted.  If you experience worsening of your admission symptoms, develop shortness of breath, life threatening  emergency, suicidal or homicidal thoughts you must seek medical attention immediately by calling 911 or calling your MD immediately  if symptoms less severe.  You Must read complete instructions/literature along with all the possible adverse reactions/side effects for all the Medicines you take and that have been prescribed to you. Take any new Medicines after you have completely understood and accpet all the possible adverse reactions/side effects.   Do not drive, operate heavy machinery, perform activities at heights, swimming or participation in water activities or provide baby sitting services if your were admitted for syncope or siezures until you have seen by Primary MD or a Neurologist and advised to do so again.  Do not drive when taking Pain medications.   Procedures/Studies: ECHOCARDIOGRAM COMPLETE Result Date: 09/01/2023    ECHOCARDIOGRAM REPORT   Patient Name:   BRADY PLANT Philippi Date of Exam: 09/01/2023 Medical Rec #:  [id number]       Height:       66.0 in Accession #:    [id number]      Weight:       436.0 lb Date of Birth:  30-Sep-1985       BSA:          2.785 m Patient Age:    37 years        BP:           143/98 mmHg Patient Gender: F               HR:           99 bpm. Exam Location:  Inpatient Procedure: 2D Echo, Color Doppler, Cardiac Doppler and Intracardiac            Opacification Agent (Both Spectral and Color Flow Doppler were            utilized during procedure). Indications:    CHF-Acute Systolic  History:        Patient has no prior history of Echocardiogram examinations.                 Signs/Symptoms:Shortness of Breath; Risk Factors:Hypertension.                 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  Sonographer:    Travis Friedman RDCS Referring Phys: [id number] TIMOTHY S OPYD  Sonographer Comments: Patient is obese and Technically difficult study due to poor  echo windows. IMPRESSIONS  1. Left ventricular ejection fraction, by estimation, is 55 to 60%. The left ventricle has normal  function. The left ventricle has no regional wall motion abnormalities. There is mild left ventricular hypertrophy. Left ventricular diastolic parameters are consistent with Grade I diastolic dysfunction (impaired relaxation). There is the interventricular septum is flattened in systole and diastole, consistent with right ventricular pressure and volume overload.  2. Right ventricular systolic function is mildly reduced. The right ventricular size is mildly enlarged. There is severely elevated pulmonary artery systolic pressure.  3. Left atrial size was moderately dilated.  4. The mitral valve is normal in structure. No evidence of mitral valve regurgitation. No evidence of mitral stenosis.  5. Tricuspid valve regurgitation is moderate to severe.  6. The aortic valve is normal in structure. Aortic valve regurgitation is not visualized. No aortic stenosis is present.  7. The inferior vena cava is dilated in size with <50% respiratory variability, suggesting right atrial pressure of 15 mmHg. FINDINGS  Left Ventricle: Left ventricular ejection fraction, by estimation, is 55 to 60%. The left ventricle has normal function. The left ventricle has no regional wall motion abnormalities. The left ventricular internal cavity size was normal in size. There is  mild left ventricular hypertrophy. The interventricular septum is flattened in systole and diastole, consistent with right ventricular pressure and volume overload. Left ventricular diastolic parameters are consistent with Grade I diastolic dysfunction (impaired relaxation). Right Ventricle: The right ventricular size is mildly enlarged. No increase in right ventricular wall thickness. Right ventricular systolic function is mildly reduced. There is severely elevated pulmonary artery systolic pressure. The tricuspid regurgitant velocity is 4.02 m/s, and with an assumed right atrial pressure of 15 mmHg, the estimated right ventricular systolic pressure is 79.6 mmHg. Left  Atrium: Left atrial size was moderately dilated. Right Atrium: Right atrial size was normal in size. Pericardium: There is no evidence of pericardial effusion. Mitral Valve: The mitral valve is normal in structure. No evidence of mitral valve regurgitation. No evidence of mitral valve stenosis. Tricuspid Valve: The tricuspid valve is normal in structure. Tricuspid valve regurgitation is moderate to severe. No evidence of tricuspid stenosis. Aortic Valve: The aortic valve is normal in structure. Aortic valve regurgitation is not visualized. No aortic stenosis is present. Aortic valve mean gradient measures 3.0 mmHg. Aortic valve peak gradient measures 5.5 mmHg. Aortic valve area, by VTI measures 3.88 cm. Pulmonic Valve: The pulmonic valve was normal in structure. Pulmonic valve regurgitation is trivial. No evidence of pulmonic stenosis. Aorta: The aortic root is normal in size and structure. Venous: The inferior vena cava is dilated in size with less than 50% respiratory variability, suggesting right atrial pressure of 15 mmHg. IAS/Shunts: No atrial level shunt detected by color flow Doppler.  LEFT VENTRICLE PLAX 2D LVIDd:         4.70 cm      Diastology LVIDs:         3.10 cm      LV e' medial:    9.25 cm/s LV PW:         1.30 cm      LV E/e' medial:  6.8 LV IVS:        1.30 cm      LV e' lateral:   6.85 cm/s LVOT diam:     2.50 cm      LV E/e' lateral: 9.2 LV SV:         68 LV SV Index:   24  LVOT Area:     4.91 cm  LV Volumes (MOD) LV vol d, MOD A2C: 114.0 ml LV vol d, MOD A4C: 130.0 ml LV vol s, MOD A2C: 38.1 ml LV vol s, MOD A4C: 50.7 ml LV SV MOD A2C:     75.9 ml LV SV MOD A4C:     130.0 ml LV SV MOD BP:      84.3 ml RIGHT VENTRICLE RV Basal diam:  4.40 cm RV Mid diam:    2.70 cm RV S prime:     10.00 cm/s TAPSE (M-mode): 1.1 cm LEFT ATRIUM              Index LA diam:        2.80 cm  1.01 cm/m LA Vol (A2C):   128.0 ml 45.96 ml/m LA Vol (A4C):   88.1 ml  31.63 ml/m LA Biplane Vol: 110.0 ml 39.50 ml/m   AORTIC VALVE AV Area (Vmax):    3.86 cm AV Area (Vmean):   3.88 cm AV Area (VTI):     3.88 cm AV Vmax:           117.00 cm/s AV Vmean:          74.800 cm/s AV VTI:            0.176 m AV Peak Grad:      5.5 mmHg AV Mean Grad:      3.0 mmHg LVOT Vmax:         91.90 cm/s LVOT Vmean:        59.100 cm/s LVOT VTI:          0.139 m LVOT/AV VTI ratio: 0.79  AORTA Ao Root diam: 3.60 cm Ao Asc diam:  3.30 cm MITRAL VALVE               TRICUSPID VALVE MV Area (PHT): 4.71 cm    TR Peak grad:   64.6 mmHg MV Decel Time: 161 msec    TR Vmax:        402.00 cm/s MV E velocity: 62.90 cm/s MV A velocity: 77.70 cm/s  SHUNTS MV E/A ratio:  0.81        Systemic VTI:  0.14 m                            Systemic Diam: 2.50 cm Dorothye Gathers MD Electronically signed by Dorothye Gathers MD Signature Date/Time: 09/01/2023/11:42:28 AM    Final    CT Angio Chest PE W and/or Wo Contrast Result Date: 08/31/2023 CLINICAL DATA:  Positive D-dimer and shortness of breath EXAM: CT ANGIOGRAPHY CHEST WITH CONTRAST TECHNIQUE: Multidetector CT imaging of the chest was performed using the standard protocol during bolus administration of intravenous contrast. Multiplanar CT image reconstructions and MIPs were obtained to evaluate the vascular anatomy. RADIATION DOSE REDUCTION: This exam was performed according to the departmental dose-optimization program which includes automated exposure control, adjustment of the mA and/or kV according to patient size and/or use of iterative reconstruction technique. CONTRAST:  75mL OMNIPAQUE  IOHEXOL  350 MG/ML SOLN COMPARISON:  Chest x-ray from earlier in the same day. FINDINGS: Cardiovascular: Thoracic aorta and its branches are within normal limits. Heart is mildly enlarged in size. Pulmonary artery shows no filling defect to suggest pulmonary embolism. Mediastinum/Nodes: Thoracic inlet is within normal limits. No hilar or mediastinal adenopathy is noted. The esophagus as visualized is within normal limits. Lungs/Pleura:  Lungs are well aerated bilaterally. No focal infiltrate  or effusion is seen. No parenchymal nodules are seen. Upper Abdomen: Visualized upper abdomen is within normal limits. Musculoskeletal: No chest wall abnormality. No acute or significant osseous findings. Review of the MIP images confirms the above findings. IMPRESSION: No evidence of pulmonary embolism. No acute abnormality noted. Electronically Signed   By: Violeta Grey M.D.   On: 08/31/2023 23:25   DG Chest 2 View Result Date: 08/31/2023 CLINICAL DATA:  Dyspnea. Shortness of breath and nonproductive cough. EXAM: CHEST - 2 VIEW COMPARISON:  10/17/2021 FINDINGS: Cardiomegaly is unchanged. Mediastinal contours are stable. Mild central vascular congestion without pulmonary edema. No focal airspace disease. No pleural effusion or pneumothorax. None limited assessment, no acute osseous findings. IMPRESSION: Chronic cardiomegaly.  Mild central vascular congestion. Electronically Signed   By: Chadwick Colonel M.D.   On: 08/31/2023 20:49     The results of significant diagnostics from this hospitalization (including imaging, microbiology, ancillary and laboratory) are listed below for reference.     Microbiology: No results found for this or any previous visit (from the past 240 hours).   Labs: BNP (last 3 results) Recent Labs    08/31/23 2013  BNP 632.1*   Basic Metabolic Panel: Recent Labs  Lab 09/04/23 0404 09/05/23 0405 09/06/23 0341 09/07/23 0427 09/08/23 0742  NA 135 136 135 131* 133*  K 4.0 3.7 4.0 4.0 3.7  CL 93* 93* 92* 88* 88*  CO2 32 32 33* 33* 29  GLUCOSE 124* 120* 120* 130* 100*  BUN 17 18 18  28* 29*  CREATININE 0.88 0.85 0.80 0.97 1.13*  CALCIUM 8.9 8.8* 9.1 9.0 9.1  MG  --  1.8 2.0 2.1 2.0   Liver Function Tests: No results for input(s): "AST", "ALT", "ALKPHOS", "BILITOT", "PROT", "ALBUMIN" in the last 168 hours. No results for input(s): "LIPASE", "AMYLASE" in the last 168 hours. No results for input(s):  "AMMONIA" in the last 168 hours. CBC: Recent Labs  Lab 09/05/23 0405 09/06/23 0341 09/07/23 0427 09/08/23 0742  WBC 8.8 7.8 6.7 5.3  HGB 13.7 14.5 14.6 14.2  HCT 44.9 47.8* 48.4* 46.8*  MCV 92.6 92.1 90.6 89.0  PLT 204 223 231 221   Cardiac Enzymes: No results for input(s): "CKTOTAL", "CKMB", "CKMBINDEX", "TROPONINI" in the last 168 hours. BNP: Invalid input(s): "POCBNP" CBG: No results for input(s): "GLUCAP" in the last 168 hours. D-Dimer No results for input(s): "DDIMER" in the last 72 hours. Hgb A1c No results for input(s): "HGBA1C" in the last 72 hours. Lipid Profile No results for input(s): "CHOL", "HDL", "LDLCALC", "TRIG", "CHOLHDL", "LDLDIRECT" in the last 72 hours. Thyroid  function studies No results for input(s): "TSH", "T4TOTAL", "T3FREE", "THYROIDAB" in the last 72 hours.  Invalid input(s): "FREET3" Anemia work up No results for input(s): "VITAMINB12", "FOLATE", "FERRITIN", "TIBC", "IRON", "RETICCTPCT" in the last 72 hours. Urinalysis    Component Value Date/Time   COLORURINE YELLOW 01/02/2020 2101   APPEARANCEUR CLEAR 01/02/2020 2101   LABSPEC 1.024 01/02/2020 2101   PHURINE 7.0 01/02/2020 2101   GLUCOSEU NEGATIVE 01/02/2020 2101   HGBUR NEGATIVE 01/02/2020 2101   BILIRUBINUR NEGATIVE 01/02/2020 2101   KETONESUR NEGATIVE 01/02/2020 2101   PROTEINUR NEGATIVE 01/02/2020 2101   UROBILINOGEN 1.0 02/04/2015 0517   NITRITE NEGATIVE 01/02/2020 2101   LEUKOCYTESUR NEGATIVE 01/02/2020 2101   Sepsis Labs Recent Labs  Lab 09/05/23 0405 09/06/23 0341 09/07/23 0427 09/08/23 0742  WBC 8.8 7.8 6.7 5.3   Microbiology No results found for this or any previous visit (from the past 240 hours).  Time coordinating discharge:  I have spent 35 minutes face to face with the patient and on the ward discussing the patients care, assessment, plan and disposition with other care givers. >50% of the time was devoted counseling the patient about the risks and benefits of  treatment/Discharge disposition and coordinating care.   SIGNED:   Maggie Schooner, MD  [facility] 09/08/2023, 10:11 AM   If 7PM-7AM, please contact night-coverage

## 2023-09-11 ENCOUNTER — Ambulatory Visit: Admitting: Physician Assistant

## 2023-09-12 ENCOUNTER — Encounter: Payer: Self-pay | Admitting: Family Medicine

## 2023-09-12 ENCOUNTER — Ambulatory Visit: Admitting: Family Medicine

## 2023-09-12 VITALS — BP 101/72 | HR 108 | Ht 66.0 in | Wt 399.8 lb

## 2023-09-12 DIAGNOSIS — G4730 Sleep apnea, unspecified: Secondary | ICD-10-CM

## 2023-09-12 DIAGNOSIS — I50811 Acute right heart failure: Secondary | ICD-10-CM

## 2023-09-12 DIAGNOSIS — I5031 Acute diastolic (congestive) heart failure: Secondary | ICD-10-CM

## 2023-09-12 MED ORDER — METHOCARBAMOL 750 MG PO TABS: 750.0000 mg | ORAL_TABLET | Freq: Every day | ORAL | 3 refills | Status: DC

## 2023-09-12 NOTE — Patient Instructions (Signed)
 Good to see you today - Thank you for coming in  Things we discussed today: If you do not hear from cardiology or the sleep study in the next week please give us  a call I will contact you with the results of your lab work today. Continue taking Lasix  and potassium until you see the heart doctor.  If you develop any shortness of breath, chest pain, leg swelling, or any other concerns please call us  or go to the emergency room   Please always bring your medication bottles  Come back in 1 month

## 2023-09-12 NOTE — Progress Notes (Signed)
    SUBJECTIVE:   CHIEF COMPLAINT / HPI:   Hospitalized 5/18 to 5/25 for acute diastolic CHF exacerbation and acute respiratory failure with hypoxia.  CTA negative for PE, was started on Lasix  80 mg daily along with potassium supplements.  Diuresed aggressively 10 L output.  Echo showed grade 1 diastolic dysfunction EF 55 to 11%, RV systolic function mildly reduced, severely elevated pulmonary artery systolic pressure, moderate-severe tricuspid valve regurgitation, dilated IVC.   Advised to follow-up for BMP and magnesium check. Also advised to get outpatient sleep study.  Pt did not see cardiology inpatient.  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   Overall feeling well since leaving the hospital.  Occasional shortness of breath when walking up stairs but much improved from when she was hospitalized.  Leg swelling has improved.  No chest pain.  PERTINENT  PMH / PSH: Prediabetes, childhood asthma, hypertension, obesity  OBJECTIVE:   BP 101/72   Pulse (!) 108   Ht 5\' 6"  (1.676 m)   Wt (!) 399 lb 12.8 oz (181.3 kg)   LMP 08/24/2023   SpO2 93%   BMI 64.53 kg/m   General: well appearing, NAD Cardiovascular: RRR, no m/r/g Respiratory: normal work of breathing on RA, CTAB, no wheezes, full exam limited 2/2 habitus Extremities: No swelling BLE, compression stockings in place. No calf tenderness or warmth.   ASSESSMENT/PLAN:   Assessment & Plan Acute diastolic congestive heart failure (HCC) Seen recently for CHF exacerbation and aggressively diuresed. Doing well since hospital discharge. SOB and leg swelling improved. - Continue Lasix  80mg  daily and Potassium 20meq daily - Cardiology referral sent - concern for R heart failure - Sleep study ordered - BMP, mag checked today - Return precautions discussed - F/u with PCP in 1 month    Sarahann Cumins, DO [facility]

## 2023-09-13 ENCOUNTER — Ambulatory Visit: Payer: Self-pay | Admitting: Family Medicine

## 2023-09-13 LAB — BASIC METABOLIC PANEL WITH GFR
BUN/Creatinine Ratio: 18 (ref 9–23)
BUN: 19 mg/dL (ref 6–20)
CO2: 24 mmol/L (ref 20–29)
Calcium: 9.3 mg/dL (ref 8.7–10.2)
Chloride: 97 mmol/L (ref 96–106)
Creatinine, Ser: 1.03 mg/dL — ABNORMAL HIGH (ref 0.57–1.00)
Glucose: 154 mg/dL — ABNORMAL HIGH (ref 70–99)
Potassium: 4.1 mmol/L (ref 3.5–5.2)
Sodium: 140 mmol/L (ref 134–144)
eGFR: 72 mL/min/{1.73_m2} (ref >59)

## 2023-09-13 LAB — MAGNESIUM: Magnesium: 1.7 mg/dL (ref 1.6–2.3)

## 2023-09-23 ENCOUNTER — Other Ambulatory Visit: Payer: Self-pay | Admitting: Family Medicine

## 2023-09-25 ENCOUNTER — Encounter (HOSPITAL_BASED_OUTPATIENT_CLINIC_OR_DEPARTMENT_OTHER): Attending: General Surgery | Admitting: Internal Medicine

## 2023-09-25 DIAGNOSIS — I87303 Chronic venous hypertension (idiopathic) without complications of bilateral lower extremity: Secondary | ICD-10-CM | POA: Diagnosis not present

## 2023-09-25 DIAGNOSIS — I5032 Chronic diastolic (congestive) heart failure: Secondary | ICD-10-CM | POA: Diagnosis not present

## 2023-10-04 ENCOUNTER — Other Ambulatory Visit: Payer: Self-pay | Admitting: Family Medicine

## 2023-10-04 ENCOUNTER — Ambulatory Visit (HOSPITAL_BASED_OUTPATIENT_CLINIC_OR_DEPARTMENT_OTHER): Admitting: Cardiology

## 2023-10-11 ENCOUNTER — Other Ambulatory Visit: Payer: Self-pay | Admitting: Family Medicine

## 2023-10-21 ENCOUNTER — Ambulatory Visit

## 2023-10-21 VITALS — BP 132/81 | HR 97 | Resp 14 | Wt >= 6400 oz

## 2023-10-21 DIAGNOSIS — R059 Cough, unspecified: Secondary | ICD-10-CM

## 2023-10-21 DIAGNOSIS — J984 Other disorders of lung: Secondary | ICD-10-CM

## 2023-10-21 LAB — POC SOFIA SARS ANTIGEN FIA: SARS Coronavirus 2 Ag: NEGATIVE

## 2023-10-21 MED ORDER — FLUTICASONE PROPIONATE 50 MCG/ACT NA SUSP: 2.0000 | Freq: Every day | NASAL | 6 refills | Status: DC

## 2023-10-21 MED ORDER — ALBUTEROL SULFATE HFA 108 (90 BASE) MCG/ACT IN AERS: 2.0000 | INHALATION_SPRAY | Freq: Four times a day (QID) | RESPIRATORY_TRACT | 2 refills | Status: DC | PRN

## 2023-10-21 NOTE — Assessment & Plan Note (Signed)
 With her abnormal PFT, we referred the pt to outpatient Pulmonology during this visit for additional work-up I ordered 2 view CXR during this visit to ruled out pneumonia which is unlikely from physical exam and to rule out possible worsening fluid overload/ Pulmonary Edema which result is pending

## 2023-10-21 NOTE — Progress Notes (Signed)
    SUBJECTIVE:   CHIEF COMPLAINT / HPI: Cold symptoms  [name] is a 38 YO female present at [facility] today with cold symptom. Pt states she has been coughing since the middle of last week which she has been using OTC cough drop without relieve. She states she has been getting about 2 hrs of sleep OVN. She work as a Production designer, theatre/television/film at [employer] but refused any sick contact. Patient with history of CHF w/ recently hospitalized due to ARF with Hypoxia. The last PFT w/ Dr. Koval show that she also has a restrictive lung disease. She denies SOB, Fever, pain.   Cough [name] states her cough started 5 days ago. The cough has been keeping her up at night. Per patient she has greenish sputum w/ coughing. Her cough is not relieved w/ OTC medications and cough drop. Pt states she recently hospitalized for Acute Respiratory Failure and Pulmonary Edema which she is now taking Lasix  for. However, this cough is different than the previous cough during ARF episode. She denies SOB.   PERTINENT  PMH / PSH:  Restrictive Lung Disease HTN Obesity GERD Seasonal allergies Prediabetes Smoker Restrictive lung disease Diastole congestive heart failure  OBJECTIVE:   BP 132/81   Pulse 97   Resp 14   Wt (!) 411 lb (186.4 kg)   LMP 10/05/2023   BMI 66.34 kg/m   Physical Exam Constitutional:      Appearance: Normal appearance. She is obese.  HENT:     Head: Normocephalic.     Nose: Nose normal.  Cardiovascular:     Rate and Rhythm: Normal rate and regular rhythm.     Pulses: Normal pulses.     Heart sounds: Normal heart sounds.  Pulmonary:     Effort: Pulmonary effort is normal.     Breath sounds: Normal breath sounds.  Abdominal:     General: Bowel sounds are normal.     Palpations: Abdomen is soft.  Neurological:     General: No focal deficit present.     Mental Status: She is alert and oriented to person, place, and time. Mental status is at baseline.  Psychiatric:        Mood and Affect: Mood normal.         Behavior: Behavior normal.        Thought Content: Thought content normal.      ASSESSMENT/PLAN:   [name] is a 38 YO female with coughing in the last 5 days. She is non-toxic appearance. Patient w/ history of lung disease and abnormal PFT with recent hospitalization for Acute Respiratory Failure, Pulmonology and Sleep Apnea referral was completed but pending for scheduling.  Assessment & Plan Restrictive lung disease With her abnormal PFT, we referred the pt to outpatient Pulmonology during this visit for additional work-up I ordered 2 view CXR during this visit to ruled out pneumonia which is unlikely from physical exam and to rule out possible worsening fluid overload/ Pulmonary Edema which result is pending Cough, unspecified type Recommending supportive care. Netti Pot was provided during this visit.  I also refilled her Inhaler which she has been taking as needed.  2 views Chest XR ordered w/ pending result  Plan is discussed with Dr. Delores, attending physician who agreed with plan   [name] Samuels, DO PGY 1 Family Medicine Residency  [facility]

## 2023-10-21 NOTE — Patient Instructions (Addendum)
   It was wonderful to see you today.  Please bring ALL of your medications with you to every visit.   Today we talked about:   An x-ray was ordered for you---you do not need an appointment to have this completed.  I recommend going to [facility] [address] [place]  And Also [facility] at [facility].  Address: [address] [place], [place], [place] [address] [phone]  If the results are normal,I will send you a letter  I will call you with results if anything is abnormal    I have referred you to Pulmonary Medicine to further evaluate your concern. If you have not received a phone call about this appointment within 3-4 weeks, please call our office back at [phone]. Margit Dimes coordinates our referrals and can assist you in this.    Please follow up in 1 months   Thank you for choosing [facility].   Please call [phone] with any questions about today's appointment.  Please be sure to schedule follow up at the front  desk before you leave today.

## 2023-11-25 ENCOUNTER — Other Ambulatory Visit: Payer: Self-pay | Admitting: Family Medicine

## 2023-11-25 DIAGNOSIS — E559 Vitamin D deficiency, unspecified: Secondary | ICD-10-CM

## 2023-12-06 ENCOUNTER — Other Ambulatory Visit: Payer: Self-pay | Admitting: Family Medicine

## 2023-12-09 ENCOUNTER — Emergency Department (HOSPITAL_COMMUNITY)
Admission: EM | Admit: 2023-12-09 | Discharge: 2023-12-09 | Disposition: A | Discharge status: Home / Self Care | Source: Other Acute Inpatient Hospital | Attending: Emergency Medicine | Admitting: Emergency Medicine

## 2023-12-09 ENCOUNTER — Emergency Department (HOSPITAL_COMMUNITY)

## 2023-12-09 ENCOUNTER — Encounter (HOSPITAL_COMMUNITY): Payer: Self-pay | Admitting: Emergency Medicine

## 2023-12-09 DIAGNOSIS — I509 Heart failure, unspecified: Secondary | ICD-10-CM | POA: Diagnosis not present

## 2023-12-09 DIAGNOSIS — R0789 Other chest pain: Secondary | ICD-10-CM | POA: Insufficient documentation

## 2023-12-09 DIAGNOSIS — R079 Chest pain, unspecified: Secondary | ICD-10-CM

## 2023-12-09 LAB — BASIC METABOLIC PANEL WITH GFR
Anion gap: 9 (ref 5–15)
BUN: 16 mg/dL (ref 6–20)
CO2: 31 mmol/L (ref 22–32)
Calcium: 8.6 mg/dL — ABNORMAL LOW (ref 8.9–10.3)
Chloride: 93 mmol/L — ABNORMAL LOW (ref 98–111)
Creatinine, Ser: 1.08 mg/dL — ABNORMAL HIGH (ref 0.44–1.00)
GFR, Estimated: >60 mL/min (ref >60)
Glucose, Bld: 93 mg/dL (ref 70–99)
Potassium: 3.4 mmol/L — ABNORMAL LOW (ref 3.5–5.1)
Sodium: 133 mmol/L — ABNORMAL LOW (ref 135–145)

## 2023-12-09 LAB — TROPONIN I (HIGH SENSITIVITY)
Troponin I (High Sensitivity): 13 ng/L (ref <18)
Troponin I (High Sensitivity): 13 ng/L (ref <18)

## 2023-12-09 LAB — D-DIMER, QUANTITATIVE: D-Dimer, Quant: 0.58 ug{FEU}/mL — ABNORMAL HIGH (ref 0.00–0.50)

## 2023-12-09 LAB — CBC
HCT: 44.7 % (ref 36.0–46.0)
Hemoglobin: 13.2 g/dL (ref 12.0–15.0)
MCH: 27.5 pg (ref 26.0–34.0)
MCHC: 29.5 g/dL — ABNORMAL LOW (ref 30.0–36.0)
MCV: 93.1 fL (ref 80.0–100.0)
Platelets: 237 K/uL (ref 150–400)
RBC: 4.8 MIL/uL (ref 3.87–5.11)
RDW: 15.9 % — ABNORMAL HIGH (ref 11.5–15.5)
WBC: 6.8 K/uL (ref 4.0–10.5)
nRBC: 0 % (ref 0.0–0.2)

## 2023-12-09 LAB — HCG, SERUM, QUALITATIVE: Preg, Serum: NEGATIVE

## 2023-12-09 LAB — BRAIN NATRIURETIC PEPTIDE: B Natriuretic Peptide: 199.8 pg/mL — ABNORMAL HIGH (ref 0.0–100.0)

## 2023-12-09 MED ORDER — KETOROLAC TROMETHAMINE 30 MG/ML IJ SOLN
15.0000 mg | Freq: Once | INTRAMUSCULAR | Status: AC
  Administered 2023-12-09: 15 mg via INTRAVENOUS
  Filled 2023-12-09: qty 1

## 2023-12-09 MED ORDER — IOHEXOL 350 MG/ML SOLN
75.0000 mL | Freq: Once | INTRAVENOUS | Status: AC | PRN
  Administered 2023-12-09: 75 mL via INTRAVENOUS

## 2023-12-09 NOTE — ED Notes (Signed)
 Pt ambulated to the restroom.

## 2023-12-09 NOTE — ED Triage Notes (Signed)
 Pt arriving POV for chest pain that started on Saturday. Pt rating pain 5/10, she states the pain is in the left side of her chest radiating to the middle as well as to her back. Pt denies SOB. Stopped smoking a few months ago.

## 2023-12-09 NOTE — ED Provider Notes (Signed)
 [place] EMERGENCY DEPARTMENT AT [facility] Provider Note   CSN: [id number] Arrival date & time: 12/09/23  2013     Patient presents with: Chest Pain   [name] is a 38 y.o. female.   38 year old female with history of morbid obesity as well as CHF presents with intermittent chest discomfort times several days.  Patient states the pain is worse with movement.  Denies any associated dyspnea.  Starts in her back and does not radiate down to her arms.  No fever cough or congestion.  No new leg pain or swelling.  States that certain positions do make it worse.  Symptoms lasts for hours and are relieved after she uses ibuprofen .  Denies any orthopnea.  Patient went to urgent care and was told to come here for further evaluation       Prior to Admission medications   Medication Sig Start Date End Date Taking? Authorizing Provider  albuterol  (VENTOLIN  HFA) 108 (90 Base) MCG/ACT inhaler Inhale 2 puffs into the lungs every 6 (six) hours as needed for wheezing or shortness of breath. 10/21/23   Delores Suzann HERO, MD  Cholecalciferol  ([employer] D3) 125 MCG (5000 UT) capsule TAKE 1 TABLET BY MOUTH ONCE A WEEK. FOR 8 WEEKS 11/25/23   Alba Sharper, MD  fluticasone  (FLONASE ) 50 MCG/ACT nasal spray Place 2 sprays into both nostrils daily. 10/21/23   Suknaim, Houston B, DO  furosemide  (LASIX ) 40 MG tablet TAKE 2 TABLETS BY MOUTH EVERY DAY 12/06/23   Alba Sharper, MD  methocarbamol  (ROBAXIN ) 750 MG tablet Take 1 tablet (750 mg total) by mouth at bedtime. 09/12/23   Everhart, Kirstie, DO  potassium chloride  SA (KLOR-CON  M) 20 MEQ tablet Take 1 tablet (20 mEq total) by mouth daily. 09/08/23 10/21/23  Caleen Burgess BROCKS, MD    Allergies: Tramadol     Review of Systems  All other systems reviewed and are negative.   Updated Vital Signs BP (!) 139/96 (BP Location: Right Arm)   Pulse 91   Temp 98.9 F (37.2 C) (Oral)   Resp (!) 23   LMP 12/04/2023 (Exact Date)   SpO2 100%   Physical  Exam Vitals and nursing note reviewed.  Constitutional:      General: She is not in acute distress.    Appearance: Normal appearance. She is well-developed. She is not toxic-appearing.  HENT:     Head: Normocephalic and atraumatic.  Eyes:     General: Lids are normal.     Conjunctiva/sclera: Conjunctivae normal.     Pupils: Pupils are equal, round, and reactive to light.  Neck:     Thyroid : No thyroid  mass.     Trachea: No tracheal deviation.  Cardiovascular:     Rate and Rhythm: Normal rate and regular rhythm.     Heart sounds: Normal heart sounds. No murmur heard.    No gallop.  Pulmonary:     Effort: Pulmonary effort is normal. No respiratory distress.     Breath sounds: Normal breath sounds. No stridor. No decreased breath sounds, wheezing, rhonchi or rales.  Abdominal:     General: There is no distension.     Palpations: Abdomen is soft.     Tenderness: There is no abdominal tenderness. There is no rebound.  Musculoskeletal:        General: No tenderness. Normal range of motion.     Cervical back: Normal range of motion and neck supple.       Back:  Skin:  General: Skin is warm and dry.     Findings: No abrasion or rash.  Neurological:     Mental Status: She is alert and oriented to person, place, and time. Mental status is at baseline.     GCS: GCS eye subscore is 4. GCS verbal subscore is 5. GCS motor subscore is 6.     Cranial Nerves: No cranial nerve deficit.     Sensory: No sensory deficit.     Motor: Motor function is intact.  Psychiatric:        Attention and Perception: Attention normal.        Speech: Speech normal.        Behavior: Behavior normal.     (all labs ordered are listed, but only abnormal results are displayed) Labs Reviewed  CBC - Abnormal; Notable for the following components:      Result Value   MCHC 29.5 (*)    RDW 15.9 (*)    All other components within normal limits  BASIC METABOLIC PANEL WITH GFR  HCG, SERUM, QUALITATIVE  BRAIN  NATRIURETIC PEPTIDE  D-DIMER, QUANTITATIVE  TROPONIN I (HIGH SENSITIVITY)    EKG: EKG Interpretation Date/Time:  Monday December 09 2023 20:26:46 EDT Ventricular Rate:  92 PR Interval:  173 QRS Duration:  118 QT Interval:  401 QTC Calculation: 497 R Axis:   109  Text Interpretation: Sinus rhythm Biatrial enlargement Incomplete right bundle branch block Nonspecific T abnormalities, lateral leads No significant change since last tracing Confirmed by Dasie Faden ([id number]) on 12/09/2023 8:41:40 PM  Radiology: No results found.   Procedures   Medications Ordered in the ED - No data to display                                  Medical Decision Making Amount and/or Complexity of Data Reviewed Labs: ordered. Radiology: ordered.  Risk Prescription drug management.   Patient is EKG shows normal sinus rhythm.  Incomplete right bundle branch block noted which is unchanged from prior.  Troponin is negative x 2.  Mildly elevated D-dimer at 0.58.  Subsequent chest CT performed which showed evidence of PE.  CBC and electrolytes without significant abnormality.  Patient's pain appears to be musculoskeletal.  Patient treated with Toradol  and feels better.  Low suspicion for ACS.  Will discharge home     Final diagnoses:  None    ED Discharge Orders     None          Dasie Faden, MD 12/09/23 2330

## 2023-12-30 ENCOUNTER — Ambulatory Visit: Admitting: Cardiology

## 2023-12-30 ENCOUNTER — Ambulatory Visit (INDEPENDENT_AMBULATORY_CARE_PROVIDER_SITE_OTHER)

## 2023-12-30 ENCOUNTER — Encounter: Payer: Self-pay | Admitting: Cardiology

## 2023-12-30 ENCOUNTER — Ambulatory Visit: Attending: Cardiology | Admitting: Cardiology

## 2023-12-30 VITALS — BP 110/84 | HR 88 | Ht 66.0 in | Wt >= 6400 oz

## 2023-12-30 VITALS — BP 102/73 | HR 81 | Ht 66.0 in | Wt >= 6400 oz

## 2023-12-30 DIAGNOSIS — I272 Pulmonary hypertension, unspecified: Secondary | ICD-10-CM | POA: Diagnosis not present

## 2023-12-30 DIAGNOSIS — N921 Excessive and frequent menstruation with irregular cycle: Secondary | ICD-10-CM | POA: Diagnosis not present

## 2023-12-30 DIAGNOSIS — R29818 Other symptoms and signs involving the nervous system: Secondary | ICD-10-CM

## 2023-12-30 DIAGNOSIS — Z6841 Body Mass Index (BMI) 40.0 and over, adult: Secondary | ICD-10-CM

## 2023-12-30 DIAGNOSIS — I5032 Chronic diastolic (congestive) heart failure: Secondary | ICD-10-CM | POA: Diagnosis not present

## 2023-12-30 DIAGNOSIS — R0683 Snoring: Secondary | ICD-10-CM

## 2023-12-30 DIAGNOSIS — M25531 Pain in right wrist: Secondary | ICD-10-CM

## 2023-12-30 DIAGNOSIS — M25532 Pain in left wrist: Secondary | ICD-10-CM

## 2023-12-30 DIAGNOSIS — I10 Essential (primary) hypertension: Secondary | ICD-10-CM

## 2023-12-30 DIAGNOSIS — I5031 Acute diastolic (congestive) heart failure: Secondary | ICD-10-CM

## 2023-12-30 LAB — POCT URINE PREGNANCY: Preg Test, Ur: NEGATIVE

## 2023-12-30 MED ORDER — FUROSEMIDE 40 MG PO TABS: 80.0000 mg | ORAL_TABLET | Freq: Every day | ORAL | 3 refills | Status: DC

## 2023-12-30 MED ORDER — POTASSIUM CHLORIDE 20 MEQ PO PACK: 20.0000 meq | PACK | Freq: Every day | ORAL | 3 refills | Status: DC

## 2023-12-30 MED ORDER — IBUPROFEN 800 MG PO TABS: 800.0000 mg | ORAL_TABLET | Freq: Three times a day (TID) | ORAL | 0 refills | Status: AC | PRN

## 2023-12-30 NOTE — Patient Instructions (Addendum)
 Medication Instructions:   Weigh Daily  Lasix  ( furosemide )   40 mg twice a day if you are 3 lbs overnight  or 5 lbs in one week  then take an extra 40 mg  that day   If you are 5 lbs over night  follow these instruction until you are back at to lbs less. Dr Anner believe you are 5 lbs up from your regular weight   Starting tomorrow  take 80 mg  ( 2 tablets) twice a day  Then 80 mg  in the morning and 40 mg in the earlier evening/afternoon  continue until you are back down in your weight.   *If you need a refill on your cardiac medications before your next appointment, please call your pharmacy*   Lab Work: Not needed    Testing/Procedures: Schedule  Limited Echo  1) Your physician has requested that you have an  limited  echocardiogram. Echocardiography is a painless test that uses sound waves to create images of your heart. It provides your doctor with information about the size and shape of your heart and how well your heart's chambers and valves are working. This procedure takes approximately one hour. There are no restrictions for this procedure. Please do NOT wear cologne, perfume, aftershave, or lotions (deodorant is allowed). Please arrive 15 minutes prior to your appointment time.  Please note: We ask at that you not bring children with you during ultrasound (echo/ vascular) testing. Due to room size and safety concerns, children are not allowed in the ultrasound rooms during exams. Our front office staff cannot provide observation of children in our lobby area while testing is being conducted. An adult accompanying a patient to their appointment will only be allowed in the ultrasound room at the discretion of the ultrasound technician under special circumstances. We apologize for any inconvenience.    Follow-Up: At [facility], you and your health needs are our priority.  As part of our continuing mission to provide you with exceptional heart care, we have created  designated Provider Care Teams.  These Care Teams include your primary Cardiologist (physician) and Advanced Practice Providers (APPs -  Physician Assistants and Nurse Practitioners) who all work together to provide you with the care you need, when you need it.     Your next appointment:   2 month(s)  The format for your next appointment:   In Person  Provider:   Alm Anner, MD   Other Instructions

## 2023-12-30 NOTE — Patient Instructions (Signed)
 Thank you for visiting the clinic today, it was good to see you!  Today we talked about: - Wrist pain - Please use the exercises on the provided handouts - Continue using Ibuprofen  800 mg every 8 hours as needed - I have sent a refill to your pharmacy - Use Voltaren  gel on the affected area as needed - You can use a wrist brace also. These can often be found online or at the pharmacy. I would recommend using it every night. You may also use it during the day if you find that it does not interfere with your work   Please follow-up on 01/08/24 at 10:30 AM with your PCP to discuss irregular bleeding on follow up on pain.  For any questions, please call the office at [phone] or send me a message in MyChart.  It was a pleasure to take care of you today. Have a great day!  Wister Hoefle, DO [place] [facility] Resident, PGY-1  ---------------------------------------------------------------------------------------------------------------------  Do you need your medications delivered to your home?   We'll send your prescription to the  [facility] for delivery.          Address: [address] [place], [place], [place] [address]          Phone: [phone]  Please call the [facility] to speak with a pharmacist and set up your home medication delivery. If you have any questions, feel free to contact us  -- we're happy to help!  Other [facility] that offer affordable prices on both prescriptions and over-the-counter items, as well as convenient services like vaccinations, are  [facility], at [facility]         Address:  [address], [place], [place] [address]         Phone: [phone]  [facility], located in the [facility]        Address: [address] [place], [place] [address]        Phone: [phone]  [facility], at [facility]        Address: [address], [place], [place] [address]       Phone: [phone]  [facility], at [facility]       Address: [address], [place], [place] [address]       Phone: [phone]

## 2023-12-30 NOTE — Assessment & Plan Note (Signed)
 Obesity with suspected obesity hypoventilation syndrome Obesity contributing to shortness of breath and suspected obesity hypoventilation syndrome. Advised weight loss to prevent further complications. - Discuss weight loss strategies and consider medications like Zepbound, Ozempic, or Renovia.

## 2023-12-30 NOTE — Assessment & Plan Note (Signed)
 Very unusual that she would have this level of diastolic heart failure in the absence of hypertension and any other cardiac history. Echocardiogram showed only grade 1 diastolic function which is not bad enough to explain the level of volume overload that she was in the hospital.  I suspect that this is more Right Heart Failure and most likely OHS with restrictive lung disease.  She is now 22 pounds up from her dry weight on discharge.  Likely needs more aggressive diuresis than I have actually written for.  My plan for now is for her to try to lose at least 5 if not 10 pounds. = We discussed sliding scale Lasix . Recheck 2D echo to assess diastolic pressures.

## 2023-12-30 NOTE — Assessment & Plan Note (Signed)
 Suspected obstructive sleep apnea Sleep apnea may contribute to pulmonary hypertension. No prior sleep study conducted. - Order sleep study. - Refer to pulmonologist for further evaluation and management.

## 2023-12-30 NOTE — Progress Notes (Signed)
 Cardiology Office Note:  .   Date:  12/30/2023  ID:  [name], DOB [birth year], MRN [id number] PCP: Alba Sharper, MD  [facility] Providers Cardiologist:  Alm Clay, MD     Chief Complaint  Patient presents with   New Patient (Initial Visit)    CHF-Likely Right Heart Failure/Obesity Hypoventilation Syndrome.    Patient Profile: .     [name] is a super morbidly obese 38 y.o. female with a PMH notable for childhood asthma, childhood congenital murmur (closed), abnormal PFTs (suggesting Restrictive Lung Disease) who presents here for What Amounts to Be a Hospital Follow-Up for acute hypoxic respiratory failure felt to be secondary to diastolic heart failure ?SABRA  She presents today at the request of Delores Suzann HERO, MD/Kristie Everhart, DO.  Pertinent PMH: Pre-DM, HTN, supermorbid obesity with abnormal PFT suggesting restrictive lung disease.     [name] was admitted from 5/17-20 08/2023 for symptoms of progressively worsening dyspnea and bilateral lower extremity edema.  She had a PE protocol CT which was negative for PE however there was evidence of volume overload and she was started on IV Lasix .  Echocardiogram showed normal EF with grade 1 diastolic dysfunction (which is way out of proportion to this level of volume overload heart failure).  She was diuresed close to 10 L having lost 36 pounds and was feeling well.  She was discharged on Lasix  80 mg daily along with potassium.  Cardiology was not consulted.  (Was seen by Roxie Prescott, DO) => Her first hospital follow-up with PCP on 09/12/2023 showed that her weight was 399-400 pounds.  Swelling was notably improved with no calf tenderness or warmth and compression socks in place.  Breathing was improved.  She was referred to cardiology for Right Heart Failure (which is a much more likely diagnosis).  She was then seen on July 7 at [facility] by Dr. Claressa was up to 411  pounds.  Main complaint at that time was cough but not fever or dyspnea.  Chest x-ray ordered along with inhaler.  Subjective  Discussed the use of AI scribe software for clinical note transcription with the patient, who gave verbal consent to proceed.  History of Present Illness [name] is a 38 year old female with a history of congenital heart murmur who presents with fluid retention and shortness of breath.  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 She has not had recent cardiology follow-up.  In May, she was hospitalized for fluid buildup and stayed for over a week. An echocardiogram showed an ejection fraction of 55-60%. She was told that both atria were dilated, with the right side being worse. She has no history of high blood pressure.  In June or July, she experienced fluid retention again, leading to another emergency room visit in August. She was tested for blood clots in the lungs, which were negative. She reports gaining 36 pounds due to fluid retention and was treated with diuretics, specifically furosemide , which she takes two tablets once daily.  She experiences symptoms suggestive of sleep apnea, including waking up tired, snoring, and feeling sleepy during the day, especially while driving. She has not yet been tested for sleep apnea but was supposed to have an appointment scheduled.  No chest pain, but she reports occasional back pain and shortness of breath with exertion, such as  climbing stairs. She uses two pillows to sleep and occasionally wakes up at night, though not due to breathing difficulties. She has not experienced heart palpitations recently but did feel a fluttering sensation after physical exertion at a gun range.  She is concerned about weight gain, noting a five-pound increase recently, which she attributes to fluid  retention.   Cardiovascular ROS: positive for - dyspnea on exertion, edema, and fluttering sensation in the chest.  Daytime sleepiness and fatigue.  Weight gain negative for - chest pain, orthopnea, paroxysmal nocturnal dyspnea, rapid heart rate, or syncope/near syncope or TIA/amaurosis fugax , claudication  ROS:  Review of Systems - Negative except symptoms noted above.    Objective   Past Surgical History:  Procedure Laterality Date   HERNIA REPAIR     INSERTION OF MESH N/A 10/14/2015   Procedure: INSERTION OF MESH;  Surgeon: Krystal Russell, MD;  Location: [facility] OR;  Service: General;  Laterality: N/A;   LAPAROSCOPIC INCISIONAL / UMBILICAL / VENTRAL HERNIA REPAIR  10/14/2015   VHR w/mesh   TONSILLECTOMY AND ADENOIDECTOMY  1990s   TYMPANOSTOMY TUBE PLACEMENT Bilateral 1990s   VEIN LIGATION AND STRIPPING Right 12/15/2014   Procedure: SAPHENOUS VEIN LIGATION AND STRIPPING; GREATER THAN 20 STAB PHLEBECTOMIES;  Surgeon: Krystal JULIANNA Doing, MD;  Location: [facility] OR;  Service: Vascular;  Laterality: Right;   VENTRAL HERNIA REPAIR N/A 10/14/2015   Procedure: LAPAROSCOPIC VENTRAL HERNIA;  Surgeon: Krystal Russell, MD;  Location: [facility] OR;  Service: General;  Laterality: N/A;   Family History  Problem Relation Age of Onset   Diabetes Mother    Varicose Veins Mother    Hypertension Mother    Hyperlipidemia Mother    Healthy Father    Varicose Veins Sister    Cancer Sister        unsure of type but requiring hysterectomy    reports that she has never smoked. She has never used smokeless tobacco. She reports current alcohol use. She reports current drug use. Drug: Marijuana.  Current Meds  Medication Sig   albuterol  (VENTOLIN  HFA) 108 (90 Base) MCG/ACT inhaler Inhale 2 puffs into the lungs every 6 (six) hours as needed for wheezing or shortness of breath.   Cholecalciferol  ([employer] D3) 125 MCG (5000 UT) capsule TAKE 1 TABLET BY MOUTH ONCE A WEEK. FOR 8 WEEKS   ibuprofen  (ADVIL ) 800 MG tablet Take 1 tablet  (800 mg total) by mouth every 8 (eight) hours as needed for up to 14 days.   potassium chloride  (KLOR-CON ) 20 MEQ packet Take 20 mEq by mouth daily. Take an  additional 20 meq when taking  extra furosemide    [Refilled] furosemide  (LASIX ) 40 MG tablet TAKE 2 TABLETS BY MOUTH EVERY DAY    Studies Reviewed: SABRA        Lab Results  Component Value Date   NA 133 (L) 12/09/2023   K 3.4 (L) 12/09/2023   CREATININE 1.08 (H) 12/09/2023   GFRNONAA >60 12/09/2023   GLUCOSE 93 12/09/2023   Lab Results  Component Value Date   HGBA1C 6.3 (H) 09/03/2023   Lab Results  Component Value Date   CHOL 120 07/21/2019   HDL 46 07/21/2019   LDLCALC 57 07/21/2019   TRIG 88 07/21/2019   CHOLHDL 2.6 07/21/2019  BNP 199.8 (12/09/2023), 632.1 (08/31/2023)  Results DIAGNOSTIC Echocardiogram: Normal LV function with EF 55-60%, grade 1 diastolic dysfunction, moderate LA dilation.  Mildly enlarged RV.  Severely elevated PAP estimated at ~80 mmHg with estimated RAP of 15  mmHg.   (09/01/2023) PFTs: Spirometry the reveals possible restrictive lung disease without reversibility-lung age 45.   Risk Assessment/Calculations:         STOP-Bang Score:  4       Physical Exam:   VS:  BP 102/73 (BP Location: Right Arm, Patient Position: Sitting, Cuff Size: Normal)   Pulse 81   Ht 5' 6 (1.676 m)   Wt (!) 422 lb (191.4 kg)   LMP 12/04/2023 (Exact Date)   SpO2 91%   BMI 68.11 kg/m    Wt Readings from Last 3 Encounters:  12/30/23 (!) 422 lb (191.4 kg)  12/30/23 (!) 422 lb 12.8 oz (191.8 kg)  10/21/23 (!) 411 lb (186.4 kg)    Physical Exam    GEN: Super morbidly obese.  Well groomed in no acute distress; falling asleep during interview with her mother.  Snoring. NECK: Unable to assess JVD; No carotid bruits CARDIAC:  RRR, Distant heart sounds but mostly normal S1, S2; no murmurs, rubs, gallops RESPIRATORY:  Clear to auscultation without rales, wheezing or rhonchi ; nonlabored, good air movement. ABDOMEN:  Soft, non-tender, non-distended EXTREMITIES: Very difficult to assess if there is edema.  She has tight possible swelling of both legs but is difficult to determine swelling versus adipose tissue.     ASSESSMENT AND PLAN: .    Problem List Items Addressed This Visit       Cardiology Problems   Chronic diastolic CHF (congestive heart failure), NYHA class 2 (HCC) (Chronic)   Very unusual that she would have this level of diastolic heart failure in the absence of hypertension and any other cardiac history. Echocardiogram showed only grade 1 diastolic function which is not bad enough to explain the level of volume overload that she was in the hospital.  I suspect that this is more Right Heart Failure and most likely OHS with restrictive lung disease.  She is now 22 pounds up from her dry weight on discharge.  Likely needs more aggressive diuresis than I have actually written for.  My plan for now is for her to try to lose at least 5 if not 10 pounds. = We discussed sliding scale Lasix . Recheck 2D echo to assess diastolic pressures.       Relevant Medications   furosemide  (LASIX ) 40 MG tablet   Essential hypertension, benign   Relevant Medications   furosemide  (LASIX ) 40 MG tablet   Other Relevant Orders   Ambulatory referral to Pulmonology   ECHOCARDIOGRAM LIMITED   Pulmonary hypertension, unspecified (HCC) - Primary   Elevated right-sided heart pressures with atrial dilation, more pronounced on the right. Normal ejection fraction at 55-60%. Suspected pulmonary hypertension possibly due to sleep apnea and obesity. No evidence of left-sided heart failure. - Order repeat echocardiogram to assess right ventricular pressures post-hospitalization.  If she is truly has volume pressure overload will probably need right heart cath. - Instruct to take an extra dose of furosemide  if weight increases by more than 3 pounds in a day or 5 pounds in a week. - Ensure prescription for furosemide   allows for 90 pills per month. - Switch potassium supplement to a powder form for easier ingestion.   - With concerns for OHS will refer to pulmonary medicine to assist with management.  I suspect that she will need sleep study based on STOP-BANG score of 4 and obviously snoring in the clinic room; complains of falling asleep driving home      Relevant Medications   furosemide  (LASIX )  40 MG tablet   Other Relevant Orders   Ambulatory referral to Pulmonology   ECHOCARDIOGRAM LIMITED     Other   Morbid obesity with BMI of 60.0-69.9, adult (HCC) (Chronic)   Obesity with suspected obesity hypoventilation syndrome Obesity contributing to shortness of breath and suspected obesity hypoventilation syndrome. Advised weight loss to prevent further complications. - Discuss weight loss strategies and consider medications like Zepbound, Ozempic, or Renovia.      Suspected sleep apnea (Chronic)   Suspected obstructive sleep apnea Sleep apnea may contribute to pulmonary hypertension. No prior sleep study conducted. - Order sleep study. - Refer to pulmonologist for further evaluation and management.         Follow-Up: Return in about 4 months (around 04/30/2024) for To discuss test results, [employer].  I spent 65 minutes in the care of SHANOAH ASBILL today including reviewing labs (2 minutes), reviewing studies (Echo and PFTs reviewed-5 minutes), face to face time discussing treatment options (26 minutes), reviewing records from hospitalization in May, recent ER visit in August as well as to clinic notes (13 minutes), 19 minutes, and documenting in the encounter.      Signed, Alm MICAEL Clay, MD, MS Alm Clay, M.D., M.S. Interventional Cardiologist  [facility] Pager # [phone]

## 2023-12-30 NOTE — Assessment & Plan Note (Deleted)
 Suspected obstructive sleep apnea Sleep apnea may contribute to pulmonary hypertension. No prior sleep study conducted. - Order sleep study. - Refer to pulmonologist for further evaluation and management.

## 2023-12-30 NOTE — Assessment & Plan Note (Addendum)
 Elevated right-sided heart pressures with atrial dilation, more pronounced on the right. Normal ejection fraction at 55-60%. Suspected pulmonary hypertension possibly due to sleep apnea and obesity. No evidence of left-sided heart failure. - Order repeat echocardiogram to assess right ventricular pressures post-hospitalization.  If [name] is truly has volume pressure overload will probably need right heart cath. - Instruct to take an extra dose of furosemide  if weight increases by more than 3 pounds in a day or 5 pounds in a week. - Ensure prescription for furosemide  allows for 90 pills per month. - Switch potassium supplement to a powder form for easier ingestion.   - With concerns for OHS will refer to pulmonary medicine to assist with management.  I suspect that [name] will need sleep study based on STOP-BANG score of 4 and obviously snoring in the clinic room; complains of falling asleep driving home

## 2023-12-30 NOTE — Progress Notes (Signed)
    SUBJECTIVE:   CHIEF COMPLAINT / HPI:   Patient presents for pain and swelling in both hands since Friday. Sx started on L hand and progressed to R hand. Experiences pain when forming a fist. Reports improvement in sx with massage. No improvement with ibuprofen . Denies numbness or tingling. 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  Patient would also like to discuss menorrhagia and hysterectomy but deferred discussion to a follow up visit due to time constraints. She is currently sexually active, not using contraceptives. LMP 8/22.  PERTINENT  PMH / PSH: restrictive lung disease, HTN, obesity, GERD, prediabetes, smoker, diastolic CHF  OBJECTIVE:   BP 889/15   Pulse 88   Ht 5' 6 (1.676 m)   Wt (!) 422 lb 12.8 oz (191.8 kg)   LMP 12/04/2023 (Exact Date)   SpO2 92%   BMI 68.24 kg/m    General: Alert, well-appearing female in NAD.  HEENT: No sign of trauma, EOM grossly intact. Cardiovascular: RRR, no m/r/g appreciated. Pulmonary: Normal WOB. CTAB with no w/c/r present. Abdomen: Soft, non-tender, non-distended. Extremities: Mild edema present to the L wrist. Full ROM of both wrists but patient does report some pain with movement. Strength intact. Neurologic: Normal gait, moves all four extremities appropriately Skin: No rashes or lesions. Psych: Appropriate mood and affect  ASSESSMENT/PLAN:   Assessment & Plan Pain in both wrists Mild edema and pain noted on exam but otherwise benign. Do not believe this is carpal tunnel given no sx of numbness or tingling. Given acute nature, this may be strain and recommend conservative tx as below at this time.  - Provided handout of wrist exercises which I encouraged patient to use - Refilled Ibuprofen  800mg  q8hrs PRN - Use Voltaren  gel PRN - Use wrist brace every night and during the day as appropriate - F/u on pain control at next visit with PCP on 01/08/24 Menorrhagia with irregular cycle Unable to discuss further  due to time constraints. Did obtain urine pregnancy which was negative. - Appointment made with PCP for 01/08/24 at 10:30 AM to discuss further   Darren Jernigan, DO [facility]

## 2024-01-02 DIAGNOSIS — M25539 Pain in unspecified wrist: Secondary | ICD-10-CM | POA: Insufficient documentation

## 2024-01-02 NOTE — Assessment & Plan Note (Signed)
 Mild edema and pain noted on exam but otherwise benign. Do not believe this is carpal tunnel given no sx of numbness or tingling. Given acute nature, this may be strain and recommend conservative tx as below at this time.  - Provided handout of wrist exercises which I encouraged patient to use - Refilled Ibuprofen  800mg  q8hrs PRN - Use Voltaren  gel PRN - Use wrist brace every night and during the day as appropriate - F/u on pain control at next visit with PCP on 01/08/24

## 2024-01-02 NOTE — Assessment & Plan Note (Signed)
 Unable to discuss further due to time constraints. Did obtain urine pregnancy which was negative. - Appointment made with PCP for 01/08/24 at 10:30 AM to discuss further

## 2024-01-08 ENCOUNTER — Ambulatory Visit: Payer: Self-pay | Admitting: Family Medicine

## 2024-01-09 ENCOUNTER — Ambulatory Visit

## 2024-01-09 NOTE — Progress Notes (Deleted)
    SUBJECTIVE:   CHIEF COMPLAINT / HPI: ***  ***  PERTINENT  PMH / PSH: HTN, CHF, Pulmonary HTN, GERD  OBJECTIVE:   There were no vitals taken for this visit.  ***  ASSESSMENT/PLAN:   Assessment & Plan  No follow-ups on file.  Ozell Provencal, MD [facility]

## 2024-01-10 ENCOUNTER — Ambulatory Visit (INDEPENDENT_AMBULATORY_CARE_PROVIDER_SITE_OTHER): Admitting: Family Medicine

## 2024-01-10 ENCOUNTER — Ambulatory Visit: Admitting: Family Medicine

## 2024-01-10 VITALS — BP 108/82 | HR 83 | Ht 66.0 in | Wt >= 6400 oz

## 2024-01-10 DIAGNOSIS — N939 Abnormal uterine and vaginal bleeding, unspecified: Secondary | ICD-10-CM | POA: Diagnosis not present

## 2024-01-10 MED ORDER — NORGESTIMATE-ETH ESTRADIOL 0.25-35 MG-MCG PO TABS: 1.0000 | ORAL_TABLET | Freq: Every day | ORAL | 11 refills | Status: DC

## 2024-01-10 NOTE — Patient Instructions (Signed)
 It was wonderful to see you today! Thank you for choosing [facility].   Please bring ALL of your medications with you to every visit.   Today we talked about:  For your abnormal uterine bleeding I do want you to take the hormonal pill every day for the next 3 months without missing any days to help regulate your cycle.  Please keep track of your symptoms and see if it improves your bleeding over time, it will take a week or so to see a change in your symptoms.  It is possible that you may need more than this to control your bleeding better but lets start here and see if it improves your symptoms. As we discussed there are other long-term options to consider like a hormonal IUD that can be removed pretty easily.  That something you would consider but we can do those at our office or you can go to OB/GYN to have it done. Long-term if you determine you do not want to have any other children there are other surgical options such as an ablation or hysterectomy after we have tried other options.  If you desire these down the road we can always refer you to OB/GYN.  Please follow up in 3 months   Call the clinic at [phone] if your symptoms worsen or you have any concerns.  Please be sure to schedule follow up at the front desk before you leave today.   Izetta Nap, DO Family Medicine

## 2024-01-10 NOTE — Progress Notes (Cosign Needed Addendum)
    SUBJECTIVE:   CHIEF COMPLAINT / HPI:   AUB Negative urine pregnancy on 12/30/2023. Bleeding any time, of the month. Can be heavy and can be light. Just got cycle and just started bleeding again. Only not bleeding around 7 days per month. Years ongoing, has had irregular cycle for some time. This year has had cycle every month. Has not been on treatment for a while but got the Depo shot that did not really help.   May possibly be open to having a child in the future.  Denies history of DVT/PE.  Denies smoking.  Denies migraine with aura.  PERTINENT  PMH / PSH: HTN, CHF, pulm HTN  OBJECTIVE:   BP 108/82   Pulse 83   Ht 5' 6 (1.676 m)   Wt (!) 411 lb 12.8 oz (186.8 kg)   LMP 12/31/2023   SpO2 98%   BMI 66.47 kg/m    General: Alert, no apparent distress, well groomed HEENT: Normocephalic, atraumatic, moist mucus membranes, neck supple Respiratory: Normal respiratory effort GI: Non-distended Skin: No rashes, no jaundice Psych: Appropriate mood and affect  ASSESSMENT/PLAN:   Assessment & Plan Abnormal uterine bleeding (AUB) Ongoing for years, prior lab workup and pelvic US  unremarkable but given BMI and history of irregular cycles likely in the setting of hormonal dysregulation.  Discussed options, patient opted for OCP given may desire child in the future.  Can also consider IUD if no improvement after 3 month trial. - Start OCPs daily, keep log of symptoms - Follow-up in 3 months for consideration of long-term management options   Dr. Izetta Nap, DO [facility]

## 2024-01-31 ENCOUNTER — Ambulatory Visit: Admitting: Family Medicine

## 2024-02-05 ENCOUNTER — Ambulatory Visit (HOSPITAL_COMMUNITY)
Admission: RE | Admit: 2024-02-05 | Discharge: 2024-02-05 | Disposition: A | Discharge status: Home / Self Care | Source: Ambulatory Visit | Attending: Cardiology | Admitting: Cardiology

## 2024-02-05 DIAGNOSIS — I10 Essential (primary) hypertension: Secondary | ICD-10-CM | POA: Insufficient documentation

## 2024-02-05 DIAGNOSIS — I272 Pulmonary hypertension, unspecified: Secondary | ICD-10-CM | POA: Diagnosis present

## 2024-02-05 DIAGNOSIS — I5031 Acute diastolic (congestive) heart failure: Secondary | ICD-10-CM | POA: Insufficient documentation

## 2024-02-05 LAB — ECHOCARDIOGRAM LIMITED
Area-P 1/2: 6.43 cm2
S' Lateral: 3.11 cm

## 2024-02-06 ENCOUNTER — Ambulatory Visit: Payer: Self-pay | Admitting: Cardiology

## 2024-02-11 ENCOUNTER — Telehealth: Payer: Self-pay | Admitting: Family Medicine

## 2024-02-11 ENCOUNTER — Other Ambulatory Visit: Payer: Self-pay | Admitting: Family Medicine

## 2024-02-11 DIAGNOSIS — I50811 Acute right heart failure: Secondary | ICD-10-CM

## 2024-02-11 NOTE — Telephone Encounter (Signed)
 Called patient to discuss [facility] Patient Advisory Committee. Confirmed name and DOB.  Discussed Patient Advisory Committee [facility]). The PAC includes patients, faculty, residents, and staff. The [facility] meets to discuss issues pertinent to [facility] patients. The PAC will meet 2-4 times per year, either virtually or in person. Lunch will be provided over these meetings. There is no other monetary benefit to attending these meetings. Benefits include having influence over changes at the [facility] and improving the experience for all at the [facility]. At the end of the conversation the patient accepted the invitation to the [facility]. Routing message to Etta Hock, MHA to send letter with written details.    Will send invitation via Mychart and e-vite - she is aware of this.

## 2024-02-17 ENCOUNTER — Telehealth: Payer: Self-pay

## 2024-02-17 NOTE — Telephone Encounter (Signed)
 Patient LVM on nurse line due to issues with picking up Furosemide  prescription.   Patient reports that cardiologist increased her dosage, however,she was told by pharmacy she could not pick up until 11/18.  Called pharmacy.   They have prescription ready for pick up for no charge. Attempted to call patient, she did not answer. LVM requesting returned call.   Chiquita JAYSON English, RN   Chiquita JAYSON English, RN

## 2024-03-02 ENCOUNTER — Emergency Department (HOSPITAL_COMMUNITY)

## 2024-03-02 ENCOUNTER — Other Ambulatory Visit: Payer: Self-pay

## 2024-03-02 ENCOUNTER — Inpatient Hospital Stay (HOSPITAL_COMMUNITY)
Admission: EM | Admit: 2024-03-02 | Discharge: 2024-03-08 | DRG: 175 | Disposition: A | Discharge status: Home / Self Care | Attending: Internal Medicine | Admitting: Internal Medicine

## 2024-03-02 ENCOUNTER — Encounter (HOSPITAL_COMMUNITY): Payer: Self-pay | Admitting: Emergency Medicine

## 2024-03-02 DIAGNOSIS — I5032 Chronic diastolic (congestive) heart failure: Secondary | ICD-10-CM | POA: Diagnosis present

## 2024-03-02 DIAGNOSIS — I2721 Secondary pulmonary arterial hypertension: Secondary | ICD-10-CM | POA: Diagnosis present

## 2024-03-02 DIAGNOSIS — R519 Headache, unspecified: Secondary | ICD-10-CM | POA: Diagnosis not present

## 2024-03-02 DIAGNOSIS — T384X5A Adverse effect of oral contraceptives, initial encounter: Secondary | ICD-10-CM | POA: Diagnosis present

## 2024-03-02 DIAGNOSIS — I2699 Other pulmonary embolism without acute cor pulmonale: Secondary | ICD-10-CM | POA: Diagnosis not present

## 2024-03-02 DIAGNOSIS — X58XXXA Exposure to other specified factors, initial encounter: Secondary | ICD-10-CM | POA: Diagnosis present

## 2024-03-02 DIAGNOSIS — N921 Excessive and frequent menstruation with irregular cycle: Secondary | ICD-10-CM

## 2024-03-02 DIAGNOSIS — N920 Excessive and frequent menstruation with regular cycle: Secondary | ICD-10-CM | POA: Diagnosis present

## 2024-03-02 DIAGNOSIS — Z888 Allergy status to other drugs, medicaments and biological substances status: Secondary | ICD-10-CM

## 2024-03-02 DIAGNOSIS — R52 Pain, unspecified: Secondary | ICD-10-CM | POA: Diagnosis not present

## 2024-03-02 DIAGNOSIS — R29818 Other symptoms and signs involving the nervous system: Secondary | ICD-10-CM | POA: Diagnosis present

## 2024-03-02 DIAGNOSIS — Z8616 Personal history of COVID-19: Secondary | ICD-10-CM

## 2024-03-02 DIAGNOSIS — I82813 Embolism and thrombosis of superficial veins of lower extremities, bilateral: Secondary | ICD-10-CM | POA: Diagnosis present

## 2024-03-02 DIAGNOSIS — K219 Gastro-esophageal reflux disease without esophagitis: Secondary | ICD-10-CM | POA: Diagnosis present

## 2024-03-02 DIAGNOSIS — Z7901 Long term (current) use of anticoagulants: Secondary | ICD-10-CM | POA: Diagnosis not present

## 2024-03-02 DIAGNOSIS — I5082 Biventricular heart failure: Secondary | ICD-10-CM | POA: Diagnosis present

## 2024-03-02 DIAGNOSIS — G4733 Obstructive sleep apnea (adult) (pediatric): Secondary | ICD-10-CM | POA: Diagnosis present

## 2024-03-02 DIAGNOSIS — G4736 Sleep related hypoventilation in conditions classified elsewhere: Secondary | ICD-10-CM

## 2024-03-02 DIAGNOSIS — I2609 Other pulmonary embolism with acute cor pulmonale: Secondary | ICD-10-CM | POA: Diagnosis present

## 2024-03-02 DIAGNOSIS — E66813 Obesity, class 3: Secondary | ICD-10-CM | POA: Diagnosis present

## 2024-03-02 DIAGNOSIS — I2694 Multiple subsegmental pulmonary emboli without acute cor pulmonale: Secondary | ICD-10-CM | POA: Diagnosis not present

## 2024-03-02 DIAGNOSIS — I5030 Unspecified diastolic (congestive) heart failure: Secondary | ICD-10-CM

## 2024-03-02 DIAGNOSIS — Z793 Long term (current) use of hormonal contraceptives: Secondary | ICD-10-CM

## 2024-03-02 DIAGNOSIS — J45909 Unspecified asthma, uncomplicated: Secondary | ICD-10-CM

## 2024-03-02 DIAGNOSIS — Z6841 Body Mass Index (BMI) 40.0 and over, adult: Secondary | ICD-10-CM

## 2024-03-02 DIAGNOSIS — I50812 Chronic right heart failure: Secondary | ICD-10-CM | POA: Diagnosis not present

## 2024-03-02 DIAGNOSIS — I110 Hypertensive heart disease with heart failure: Secondary | ICD-10-CM | POA: Diagnosis present

## 2024-03-02 DIAGNOSIS — R0602 Shortness of breath: Secondary | ICD-10-CM | POA: Diagnosis not present

## 2024-03-02 DIAGNOSIS — Z87891 Personal history of nicotine dependence: Secondary | ICD-10-CM

## 2024-03-02 DIAGNOSIS — Z79899 Other long term (current) drug therapy: Secondary | ICD-10-CM

## 2024-03-02 DIAGNOSIS — G4734 Idiopathic sleep related nonobstructive alveolar hypoventilation: Secondary | ICD-10-CM | POA: Diagnosis not present

## 2024-03-02 DIAGNOSIS — J9601 Acute respiratory failure with hypoxia: Secondary | ICD-10-CM | POA: Diagnosis present

## 2024-03-02 DIAGNOSIS — Z8249 Family history of ischemic heart disease and other diseases of the circulatory system: Secondary | ICD-10-CM

## 2024-03-02 LAB — BASIC METABOLIC PANEL WITH GFR
Anion gap: 10 (ref 5–15)
BUN: 13 mg/dL (ref 6–20)
CO2: 31 mmol/L (ref 22–32)
Calcium: 9.2 mg/dL (ref 8.9–10.3)
Chloride: 97 mmol/L — ABNORMAL LOW (ref 98–111)
Creatinine, Ser: 1.09 mg/dL — ABNORMAL HIGH (ref 0.44–1.00)
GFR, Estimated: >60 mL/min (ref >60)
Glucose, Bld: 154 mg/dL — ABNORMAL HIGH (ref 70–99)
Potassium: 3.8 mmol/L (ref 3.5–5.1)
Sodium: 138 mmol/L (ref 135–145)

## 2024-03-02 LAB — CBC
HCT: 45.9 % (ref 36.0–46.0)
Hemoglobin: 14.5 g/dL (ref 12.0–15.0)
MCH: 29.3 pg (ref 26.0–34.0)
MCHC: 31.6 g/dL (ref 30.0–36.0)
MCV: 92.7 fL (ref 80.0–100.0)
Platelets: 205 K/uL (ref 150–400)
RBC: 4.95 MIL/uL (ref 3.87–5.11)
RDW: 14.5 % (ref 11.5–15.5)
WBC: 8.4 K/uL (ref 4.0–10.5)
nRBC: 0 % (ref 0.0–0.2)

## 2024-03-02 LAB — I-STAT CG4 LACTIC ACID, ED: Lactic Acid, Venous: 1.4 mmol/L (ref 0.5–1.9)

## 2024-03-02 LAB — PROTIME-INR
INR: 1 (ref 0.8–1.2)
Prothrombin Time: 14.2 s (ref 11.4–15.2)

## 2024-03-02 LAB — HEPARIN LEVEL (UNFRACTIONATED)
Heparin Unfractionated: <0.1 [IU]/mL — ABNORMAL LOW (ref 0.30–0.70)
Heparin Unfractionated: 0.22 [IU]/mL — ABNORMAL LOW (ref 0.30–0.70)

## 2024-03-02 LAB — APTT: aPTT: 29 s (ref 24–36)

## 2024-03-02 LAB — PRO BRAIN NATRIURETIC PEPTIDE: Pro Brain Natriuretic Peptide: 3470 pg/mL — ABNORMAL HIGH (ref <300.0)

## 2024-03-02 LAB — TROPONIN T, HIGH SENSITIVITY
Troponin T High Sensitivity: 27 ng/L — ABNORMAL HIGH (ref 0–19)
Troponin T High Sensitivity: 29 ng/L — ABNORMAL HIGH (ref 0–19)

## 2024-03-02 MED ORDER — ONDANSETRON HCL 4 MG/2ML IJ SOLN: 4.0000 mg | Freq: Four times a day (QID) | INTRAMUSCULAR | Status: DC | PRN

## 2024-03-02 MED ORDER — ACETAMINOPHEN 325 MG PO TABS
650.0000 mg | ORAL_TABLET | Freq: Four times a day (QID) | ORAL | Status: DC | PRN
  Administered 2024-03-03 – 2024-03-07 (×4): 650 mg via ORAL
  Filled 2024-03-02 (×4): qty 2

## 2024-03-02 MED ORDER — SODIUM CHLORIDE (PF) 0.9 % IJ SOLN
INTRAMUSCULAR | Status: AC
  Filled 2024-03-02: qty 50

## 2024-03-02 MED ORDER — HEPARIN BOLUS VIA INFUSION
1500.0000 [IU] | Freq: Once | INTRAVENOUS | Status: AC
  Administered 2024-03-02: 1500 [IU] via INTRAVENOUS
  Filled 2024-03-02: qty 1500

## 2024-03-02 MED ORDER — BISACODYL 5 MG PO TBEC
5.0000 mg | DELAYED_RELEASE_TABLET | Freq: Every day | ORAL | Status: DC | PRN
Start: 2024-03-02 — End: 2024-03-08

## 2024-03-02 MED ORDER — IOHEXOL 350 MG/ML SOLN
75.0000 mL | Freq: Once | INTRAVENOUS | Status: AC | PRN
  Administered 2024-03-02: 80 mL via INTRAVENOUS

## 2024-03-02 MED ORDER — ACETAMINOPHEN 650 MG RE SUPP
650.0000 mg | Freq: Four times a day (QID) | RECTAL | Status: DC | PRN
Start: 2024-03-02 — End: 2024-03-08

## 2024-03-02 MED ORDER — HEPARIN (PORCINE) 25000 UT/250ML-% IV SOLN
2400.0000 [IU]/h | INTRAVENOUS | Status: DC
  Administered 2024-03-02: 2200 [IU]/h via INTRAVENOUS
  Administered 2024-03-03 – 2024-03-05 (×6): 2400 [IU]/h via INTRAVENOUS
  Filled 2024-03-02 (×7): qty 250

## 2024-03-02 MED ORDER — FUROSEMIDE 40 MG PO TABS
80.0000 mg | ORAL_TABLET | Freq: Every day | ORAL | Status: DC
  Administered 2024-03-03 – 2024-03-05 (×3): 80 mg via ORAL
  Filled 2024-03-02 (×3): qty 2

## 2024-03-02 MED ORDER — ONDANSETRON HCL 4 MG PO TABS: 4.0000 mg | ORAL_TABLET | Freq: Four times a day (QID) | ORAL | Status: DC | PRN

## 2024-03-02 MED ORDER — MELATONIN 3 MG PO TABS
3.0000 mg | ORAL_TABLET | Freq: Every evening | ORAL | Status: DC | PRN
  Administered 2024-03-03 – 2024-03-07 (×3): 3 mg via ORAL
  Filled 2024-03-02 (×3): qty 1

## 2024-03-02 MED ORDER — HEPARIN BOLUS VIA INFUSION
4000.0000 [IU] | Freq: Once | INTRAVENOUS | Status: AC
  Administered 2024-03-02: 4000 [IU] via INTRAVENOUS
  Filled 2024-03-02: qty 4000

## 2024-03-02 NOTE — ED Triage Notes (Signed)
 Patient c/o SOB x 1 week. Patient states she get SOB and palpitations when walking short distance. Patient denies Chest pain. Patient denies N/V.

## 2024-03-02 NOTE — ED Notes (Signed)
 Pt placed on 2L NC per provider.

## 2024-03-02 NOTE — Progress Notes (Signed)
 PHARMACY - ANTICOAGULATION CONSULT NOTE  Pharmacy Consult for IV UFH Indication: pulmonary embolus  Allergies  Allergen Reactions   Tramadol  Other (See Comments)    Made the patient feel not like herself    Patient Measurements: Height: 5' 6 (167.6 cm) Weight: (!) 186.8 kg (411 lb 13.1 oz) IBW/kg (Calculated) : 59.3 HEPARIN  DW (KG): 107.9  Vital Signs: Temp: 98.9 F (37.2 C) (11/17 2115) Temp Source: Oral (11/17 2115) BP: 123/67 (11/17 2115) Pulse Rate: 100 (11/17 2115)  Labs: Recent Labs    03/02/24 1330 03/02/24 1545 03/02/24 2209  HGB 14.5  --   --   HCT 45.9  --   --   PLT 205  --   --   APTT  --  29  --   LABPROT  --  14.2  --   INR  --  1.0  --   HEPARINUNFRC  --  <0.10* 0.22*  CREATININE 1.09*  --   --     Estimated Creatinine Clearance: 121.9 mL/min (A) (by C-G formula based on SCr of 1.09 mg/dL (H)).   Medical History: Past Medical History:  Diagnosis Date   Ankle ulcer (HCC)    right   Birth control counseling 01/25/2014   Childhood asthma    Chronic lower back pain    Encounter for counseling regarding contraception 05/03/2023   GERD (gastroesophageal reflux disease)    Headache    only when I'm hungry (10/14/2015)   History of COVID-19 12/22/2019   History of iritis 10/08/2013   Hypertension    Obesity    Pap smear for cervical cancer screening 01/06/2018   Varicose veins    VSD (ventricular septal defect)    had hole in bottom of her heart; closed up on it's own; no OR /mom 10/14/2015)    Medications:  Medications Prior to Admission  Medication Sig Dispense Refill Last Dose/Taking   albuterol  (VENTOLIN  HFA) 108 (90 Base) MCG/ACT inhaler Inhale 2 puffs into the lungs every 6 (six) hours as needed for wheezing or shortness of breath. 8 g 2 03/02/2024 Morning   Cholecalciferol  ([employer] D3) 125 MCG (5000 UT) capsule TAKE 1 TABLET BY MOUTH ONCE A WEEK. FOR 8 WEEKS (Patient taking differently: Take 5,000 Units by mouth every 7 (seven) days.)  100 capsule 0 Past Week   furosemide  (LASIX ) 40 MG tablet Take 2 tablets (80 mg total) by mouth daily. May take an additional 80 mg if weight is greater 3 lbs or more (Patient taking differently: Take 80 mg by mouth See admin instructions. Take 80 mg by mouth in the morning and may take an additional 80 mg if weight is greater 3 lbs or more within a period of a couple of days) 220 tablet 3 03/01/2024 Morning   ibuprofen  (ADVIL ) 800 MG tablet Take 800 mg by mouth every 8 (eight) hours as needed for mild pain (pain score 1-3) or headache.   Unknown   methocarbamol  (ROBAXIN ) 500 MG tablet Take 500 mg by mouth every 6 (six) hours as needed for muscle spasms.   Unknown   potassium chloride  (KLOR-CON ) 20 MEQ packet Take 20 mEq by mouth daily. Take an  additional 20 meq when taking  extra furosemide  (Patient taking differently: Take 20 mEq by mouth See admin instructions. Dissolve 20 mEq as directed and drink by mouth in the morning and an additional 20 mEq once a day when taking extra Furosemide ) 180 packet 3 Past Week   TYLENOL  8 HOUR ARTHRITIS PAIN 650  MG CR tablet Take 650-1,300 mg by mouth every 8 (eight) hours as needed for pain.   Unknown   methocarbamol  (ROBAXIN ) 750 MG tablet Take 1 tablet (750 mg total) by mouth at bedtime. (Patient not taking: Reported on 03/02/2024) 30 tablet 3 Not Taking   norgestimate -ethinyl estradiol  (SPRINTEC 28) 0.25-35 MG-MCG tablet Take 1 tablet by mouth daily. (Patient not taking: Reported on 03/02/2024) 28 tablet 11 Not Taking     Assessment: 38 yo presented with shortness of breath found to have new PE with concern for right heart strain. Patient not on anticoagulation prior to admission but is on birth control medication. Baseline CBC and renal function already done.   03/02/24 Heparin  level = 0.22 (subtherapeutic) with heparin  gtt @ 2200 units/hr RN confirmed no interruption in therapy; no bleeding complications reported  Goal of Therapy:  Heparin  level 0.3-0.7  units/ml Monitor platelets by anticoagulation protocol: Yes   Plan:  Heparin  1500 unit bolus x 1 Increase heparin  gtt rate to 2400 units/hr Check heparin  level in 6 hours Daily CBC  Arvin Gauss, PharmD 03/02/2024,11:12 PM

## 2024-03-02 NOTE — H&P (Signed)
 History and Physical    Patient: [name] FMW:[id number] DOB: [birth year] DOA: 03/02/2024 DOS: the patient was seen and examined on 03/02/2024 PCP: Alba Sharper, MD  Patient coming from: Home  Chief Complaint:  Chief Complaint  Patient presents with   Shortness of Breath   HPI: [name] is a 38 y.o. female with medical history significant for obesity and chronic.  1 diastolic heart failure on Lasix  who presents with several days of increasing shortness of breath.  The the patient says it became significant 3 days ago.  He was at work and she kept having to stop and sit down.  She felt very anxious.  She thought maybe she was just overwhelmed and stressed from work.  The next day she did not have to go in and stayed home.  The day after that she called in because she was short of breath that morning before leaving the house.  Today she went into work but was so short of breath she could could not do her job.  She left work and came to the hospital.  She has been taking her Lasix  every day.  That is one of the reason she came in because normally when she feels a little short of breath the Lasix  fixes it. The patient reports that she recently started on birth control pills to help control the heavy bleeding that she has with her periods.  Last month was her first month.  She is currently on the third week of her second month. In the emergency department a CTA was done which showed bilateral PEs with right heart strain.  The pulmonologist were consulted but felt that she does not need thrombolytics as her right heart strain may be not all due to the PE but possibly from her pulmonary hypertension due to her chronic right heart failure and likely obstructive sleep apnea.  The patient was put on IV heparin  in the emergency department.  She will also be placed on 2 L O2 nasal cannula and admitted by the hospitalist service.  Review of Systems: As mentioned in the history of present  illness. All other systems reviewed and are negative. Past Medical History:  Diagnosis Date   Ankle ulcer (HCC)    right   Birth control counseling 01/25/2014   Childhood asthma    Chronic lower back pain    Encounter for counseling regarding contraception 05/03/2023   GERD (gastroesophageal reflux disease)    Headache    only when I'm hungry (10/14/2015)   History of COVID-19 12/22/2019   History of iritis 10/08/2013   Hypertension    Obesity    Pap smear for cervical cancer screening 01/06/2018   Varicose veins    VSD (ventricular septal defect)    had hole in bottom of her heart; closed up on it's own; no OR /mom 10/14/2015)   Past Surgical History:  Procedure Laterality Date   HERNIA REPAIR     INSERTION OF MESH N/A 10/14/2015   Procedure: INSERTION OF MESH;  Surgeon: Krystal Russell, MD;  Location: [facility] OR;  Service: General;  Laterality: N/A;   LAPAROSCOPIC INCISIONAL / UMBILICAL / VENTRAL HERNIA REPAIR  10/14/2015   VHR w/mesh   TONSILLECTOMY AND ADENOIDECTOMY  1990s   TYMPANOSTOMY TUBE PLACEMENT Bilateral 1990s   VEIN LIGATION AND STRIPPING Right 12/15/2014   Procedure: SAPHENOUS VEIN LIGATION AND STRIPPING; GREATER THAN 20 STAB PHLEBECTOMIES;  Surgeon: Krystal JULIANNA Doing, MD;  Location: [facility] OR;  Service: Vascular;  Laterality:  Right;   VENTRAL HERNIA REPAIR N/A 10/14/2015   Procedure: LAPAROSCOPIC VENTRAL HERNIA;  Surgeon: Krystal Russell, MD;  Location: [facility] OR;  Service: General;  Laterality: N/A;   Social History:  reports that she has never smoked. She has never used smokeless tobacco. She reports current alcohol use. She reports current drug use. Drug: Marijuana.  Allergies  Allergen Reactions   Tramadol  Other (See Comments)    Did not make her feel like herself    Family History  Problem Relation Age of Onset   Diabetes Mother    Varicose Veins Mother    Hypertension Mother    Hyperlipidemia Mother    Healthy Father    Varicose Veins Sister    Cancer Sister         unsure of type but requiring hysterectomy    Prior to Admission medications   Medication Sig Start Date End Date Taking? Authorizing Provider  albuterol  (VENTOLIN  HFA) 108 (90 Base) MCG/ACT inhaler Inhale 2 puffs into the lungs every 6 (six) hours as needed for wheezing or shortness of breath. 10/21/23   Delores Suzann HERO, MD  Cholecalciferol  (CVS D3) 125 MCG (5000 UT) capsule TAKE 1 TABLET BY MOUTH ONCE A WEEK. FOR 8 WEEKS 11/25/23   Alba Sharper, MD  furosemide  (LASIX ) 40 MG tablet Take 2 tablets (80 mg total) by mouth daily. May take an additional 80 mg if weight is greater 3 lbs or more 12/30/23   Anner Alm ORN, MD  methocarbamol  (ROBAXIN ) 750 MG tablet Take 1 tablet (750 mg total) by mouth at bedtime. Patient not taking: Reported on 12/30/2023 09/12/23   Everhart, Kirstie, DO  norgestimate -ethinyl estradiol  (SPRINTEC 28) 0.25-35 MG-MCG tablet Take 1 tablet by mouth daily. 01/10/24   Theophilus Pagan, MD  potassium chloride  (KLOR-CON ) 20 MEQ packet Take 20 mEq by mouth daily. Take an  additional 20 meq when taking  extra furosemide  12/30/23   Anner Alm ORN, MD    Physical Exam: Vitals:   03/02/24 1546 03/02/24 1600 03/02/24 1658 03/02/24 1659  BP: 119/87 108/79  105/81  Pulse:  91  96  Resp:  18  (!) 28  Temp:   97.7 F (36.5 C)   TempSrc:   Oral   SpO2:  95%  94%  Weight:      Height:       Physical Exam:  General: No acute distress, obese HEENT: Normocephalic, atraumatic, PERRL Cardiovascular: Normal rate and rhythm. Distal pulses faint Pulmonary: Normal pulmonary effort, no wheezes rhonchi or rails Gastrointestinal: Nondistended abdomen, soft, non-tender, normoactive bowel sounds Musculoskeletal:Normal ROM, mild lower ext edema Skin: Skin is warm and dry. Neuro: No focal deficits noted, AAOx3. PSYCH: Attentive and cooperative  Data Reviewed:  Results for orders placed or performed during the hospital encounter of 03/02/24 (from the past 24 hours)  Basic metabolic  panel     Status: Abnormal   Collection Time: 03/02/24  1:30 PM  Result Value Ref Range   Sodium 138 135 - 145 mmol/L   Potassium 3.8 3.5 - 5.1 mmol/L   Chloride 97 (L) 98 - 111 mmol/L   CO2 31 22 - 32 mmol/L   Glucose, Bld 154 (H) 70 - 99 mg/dL   BUN 13 6 - 20 mg/dL   Creatinine, Ser 8.90 (H) 0.44 - 1.00 mg/dL   Calcium 9.2 8.9 - 89.6 mg/dL   GFR, Estimated >39 >39 mL/min   Anion gap 10 5 - 15  CBC     Status: None  Collection Time: 03/02/24  1:30 PM  Result Value Ref Range   WBC 8.4 4.0 - 10.5 K/uL   RBC 4.95 3.87 - 5.11 MIL/uL   Hemoglobin 14.5 12.0 - 15.0 g/dL   HCT 54.0 63.9 - 53.9 %   MCV 92.7 80.0 - 100.0 fL   MCH 29.3 26.0 - 34.0 pg   MCHC 31.6 30.0 - 36.0 g/dL   RDW 85.4 88.4 - 84.4 %   Platelets 205 150 - 400 K/uL   nRBC 0.0 0.0 - 0.2 %  Pro Brain natriuretic peptide     Status: Abnormal   Collection Time: 03/02/24  1:30 PM  Result Value Ref Range   Pro Brain Natriuretic Peptide 3,470.0 (H) <300.0 pg/mL  Troponin T, High Sensitivity     Status: Abnormal   Collection Time: 03/02/24  1:30 PM  Result Value Ref Range   Troponin T High Sensitivity 27 (H) 0 - 19 ng/L  Troponin T, High Sensitivity     Status: Abnormal   Collection Time: 03/02/24  3:45 PM  Result Value Ref Range   Troponin T High Sensitivity 29 (H) 0 - 19 ng/L  Protime-INR     Status: None   Collection Time: 03/02/24  3:45 PM  Result Value Ref Range   Prothrombin Time 14.2 11.4 - 15.2 seconds   INR 1.0 0.8 - 1.2  APTT     Status: None   Collection Time: 03/02/24  3:45 PM  Result Value Ref Range   aPTT 29 24 - 36 seconds  Heparin  level (unfractionated)     Status: Abnormal   Collection Time: 03/02/24  3:45 PM  Result Value Ref Range   Heparin  Unfractionated <0.10 (L) 0.30 - 0.70 IU/mL  I-Stat CG4 Lactic Acid     Status: None   Collection Time: 03/02/24  3:56 PM  Result Value Ref Range   Lactic Acid, Venous 1.4 0.5 - 1.9 mmol/L      CTA IMPRESSION: 1. Acute pulmonary emboli involving right  upper lobe, right middle lobe, right lower lobe, and left upper and lower lobe branches. 2. Right heart strain with elevated right ventricular to left ventricular ratio of 1.26 and right heart enlargement, with reflux of contrast into hepatic veins. Appearance concerning for at least submassive pulmonary embolus and right heart strain. 3. Chronic substantial main pulmonary artery enlargement compatible with pulmonary arterial hypertension. 4. Chronic cardiomegaly. 5. Nonspecific mosaic attenuation in the lungs, favoring air trapping over chronic pulmonary embolus.   Assessment and Plan: Bilateral segmental and subsegmental pulmonary emboli - IV heparin .  - Transition to DOAC once no longer requiring oxygen - Echocardiogram - Discussed with patient that this was likely due to the hormones and that she should never take birth control pills.  2.  Chronic diastolic heart failure  - Continue Lasix  80 mg daily    Advance Care Planning:   Code Status: Prior  The patient names her mother is her surrogate decision maker and she wants to be full code Consults: PCCM  Family Communication: none  Severity of Illness: The appropriate patient status for this patient is INPATIENT. Inpatient status is judged to be reasonable and necessary in order to provide the required intensity of service to ensure the patient's safety. The patient's presenting symptoms, physical exam findings, and initial radiographic and laboratory data in the context of their chronic comorbidities is felt to place them at high risk for further clinical deterioration. Furthermore, it is not anticipated that the patient will be medically  stable for discharge from the hospital within 2 midnights of admission.   * I certify that at the point of admission it is my clinical judgment that the patient will require inpatient hospital care spanning beyond 2 midnights from the point of admission due to high intensity of service, high risk  for further deterioration and high frequency of surveillance required.*  Author: ARTHEA CHILD, MD 03/02/2024 5:25 PM  For on call review [email or web address].

## 2024-03-02 NOTE — Consult Note (Signed)
 NAME:  [name], MRN:  [id number], DOB:  [birth year], LOS: 0 ADMISSION DATE:  03/02/2024, CONSULTATION DATE: 03/02/2024 REFERRING MD: Curtistine Dawn, MD, CHIEF COMPLAINT:  Pulmonary embolism   History of Present Illness:   The patient, with diastolic heart failure, RV failure and obesity,, presents with shortness of breath and a new diagnosis of pulmonary embolism.  Dyspnea - Shortness of breath present for one week - Initial suspicion for fluid retention related to diuretic use - Progressive worsening of symptoms - Current use of albuterol  inhaler as needed for shortness of breath  Pulmonary embolism - Noted on CTA in the emergency room - Onset of symptoms coincided with initiation of hormone pills (likely birth control) last month for menstrual cycle regulation - Recent six-hour car trip to [place]  and back while on hormone medication - No recent surgeries or prolonged immobility  Cardiac dysfunction - Diastolic heart failure and right-sided heart failure managed by Dr. Anner, cardiologist - History of pulmonary hypertension  Sleep-related breathing disorder - Potential sleep apnea under evaluation - Outpatient sleep consult scheduled for December  Asthma - History of childhood asthma but is well-controlled now - Current use of albuterol  inhaler as needed  Thromboembolic risk factors - Family history of blood clots on maternal side - Aunt with history of pulmonary embolism  Pertinent  Medical History    has a past medical history of Ankle ulcer (HCC), Birth control counseling (01/25/2014), Childhood asthma, Chronic lower back pain, Encounter for counseling regarding contraception (05/03/2023), GERD (gastroesophageal reflux disease), Headache, History of COVID-19 (12/22/2019), History of iritis (10/08/2013), Hypertension, Obesity, Pap smear for cervical cancer screening (01/06/2018), Varicose veins, and VSD (ventricular septal defect).   Significant Hospital  Events: Including procedures, antibiotic start and stop dates in addition to other pertinent events     Interim History / Subjective:    Objective    Blood pressure 105/81, pulse 96, temperature 97.7 F (36.5 C), temperature source Oral, resp. rate (!) 28, height 5' 6 (1.676 m), weight (!) 186.8 kg, last menstrual period 02/14/2024, SpO2 94%.       No intake or output data in the 24 hours ending 03/02/24 1710 Filed Weights   03/02/24 1500  Weight: (!) 186.8 kg    Examination:   Resolved problem list   Assessment and Plan  Acute bilateral segmental and subsegmental pulmonary embolism Likely related to recent initiation of hormone pills, long car ride, and predisposing factors such as obesity and suspected sleep apnea. Clots are peripheral and not large central clots, thus not amenable to interventional radiology. Current treatment with heparin  is appropriate to prevent new clot formation and aid in dissolving existing clots. - Continue heparin  drip - Switch to oral anticoagulant after stabilization - Check for deep vein thrombosis in the legs - Perform echocardiogram to re assess right heart strain - Advised discontinuation of hormone pills  Chronic right heart failure and diastolic heart failure with pulmonary hypertension Obstructive sleep apnea/OHS, suspected Likely exacerbated by sleep apnea and obesity. Right heart strain noted on CT scan, not solely due to recent pulmonary embolism. No need for aggressive interventional treatment at this time. - Continue follow-up with cardiologist - Has  appointment with Dr. Neda, sleep specialist in December  Signature:   Dashanna Kinnamon MD [facility] See Amion for pager  If no response to pager , please call [phone] until 7pm After 7:00 pm call Elink  [phone] 03/02/2024, 5:10 PM

## 2024-03-02 NOTE — H&P (Deleted)
   NAME:  [name], MRN:  [id number], DOB:  1985-04-25, LOS: 0 ADMISSION DATE:  03/02/2024, CONSULTATION DATE: 03/02/2024 REFERRING MD: Curtistine Dawn, MD, CHIEF COMPLAINT:  Pulmonary embolism   History of Present Illness:   The patient, with diastolic heart failure, RV failure and obesity,, presents with shortness of breath and a new diagnosis of pulmonary embolism.  Dyspnea - Shortness of breath present for one week - Initial suspicion for fluid retention related to diuretic use - Progressive worsening of symptoms - Current use of albuterol  inhaler as needed for shortness of breath  Pulmonary embolism - Noted on CTA in the emergency room - Onset of symptoms coincided with initiation of hormone pills (likely birth control) last month for menstrual cycle regulation - Recent six-hour car trip to [place]  and back while on hormone medication - No recent surgeries or prolonged immobility  Cardiac dysfunction - Diastolic heart failure and right-sided heart failure managed by Dr. Anner, cardiologist - History of pulmonary hypertension  Sleep-related breathing disorder - Potential sleep apnea under evaluation - Outpatient sleep consult scheduled for December  Asthma - History of childhood asthma but is well-controlled now - Current use of albuterol  inhaler as needed  Thromboembolic risk factors - Family history of blood clots on maternal side - Aunt with history of pulmonary embolism  Pertinent  Medical History    has a past medical history of Ankle ulcer (HCC), Birth control counseling (01/25/2014), Childhood asthma, Chronic lower back pain, Encounter for counseling regarding contraception (05/03/2023), GERD (gastroesophageal reflux disease), Headache, History of COVID-19 (12/22/2019), History of iritis (10/08/2013), Hypertension, Obesity, Pap smear for cervical cancer screening (01/06/2018), Varicose veins, and VSD (ventricular septal defect).   Significant Hospital  Events: Including procedures, antibiotic start and stop dates in addition to other pertinent events     Interim History / Subjective:    Objective    Blood pressure 108/79, pulse 91, temperature 98.7 F (37.1 C), temperature source Oral, resp. rate 18, height 5' 6 (1.676 m), weight (!) 186.8 kg, last menstrual period 02/14/2024, SpO2 95%.       No intake or output data in the 24 hours ending 03/02/24 1636 Filed Weights   03/02/24 1500  Weight: (!) 186.8 kg    Examination:   Resolved problem list   Assessment and Plan  Acute bilateral segmental and subsegmental pulmonary embolism Likely related to recent initiation of hormone pills, long car ride, and predisposing factors such as obesity and suspected sleep apnea. Clots are peripheral and not large central clots, thus not amenable to interventional radiology. Current treatment with heparin  is appropriate to prevent new clot formation and aid in dissolving existing clots. - Continue heparin  drip - Switch to oral anticoagulant after stabilization - Check for deep vein thrombosis in the legs - Perform echocardiogram to re assess right heart strain - Advised discontinuation of hormone pills  Chronic right heart failure and diastolic heart failure with pulmonary hypertension Obstructive sleep apnea/OHS, suspected Likely exacerbated by sleep apnea and obesity. Right heart strain noted on CT scan, not solely due to recent pulmonary embolism. No need for aggressive interventional treatment at this time. - Continue follow-up with cardiologist - Has  appointment with Dr. Neda, sleep specialist in December  Signature:   Juanantonio Stolar MD [facility] See Amion for pager  If no response to pager , please call [phone] until 7pm After 7:00 pm call Elink  [phone] 03/02/2024, 4:43 PM

## 2024-03-02 NOTE — ED Provider Notes (Signed)
  EMERGENCY DEPARTMENT AT [facility] Provider Note   CSN: [id number] Arrival date & time: 03/02/24  1247     Patient presents with: Shortness of Breath   [name] is a 38 y.o. female with h/o fluid retention on Lasix  presents to the emerged from today for evaluation of shortness of breath.  Patient reports that she has been feeling short of breath for the past week, at rest and on exertion.  Reports that she started to feel some palpitations on Friday that were episodic and no exacerbating or relieving factors.  Reports that she had a chronic cough for the past year but this has been unchanged.  Dry.  Was not denies any chest pain.  She reports that she did feel one of the veins was distended in her left upper thigh but that this was from wearing her compression stockings.  She does not feel that any worsening edema to her lower legs.  Is not having any lightheadedness, dizziness, belly pain, nausea, or vomiting.  She is on hormonal birth control for her menstrual cycles.  Denies any blood thinner use.   Shortness of Breath Associated symptoms: cough   Associated symptoms: no abdominal pain, no chest pain, no fever and no vomiting        Prior to Admission medications   Medication Sig Start Date End Date Taking? Authorizing Provider  albuterol  (VENTOLIN  HFA) 108 (90 Base) MCG/ACT inhaler Inhale 2 puffs into the lungs every 6 (six) hours as needed for wheezing or shortness of breath. 10/21/23   Delores Suzann HERO, MD  Cholecalciferol  ([employer] D3) 125 MCG (5000 UT) capsule TAKE 1 TABLET BY MOUTH ONCE A WEEK. FOR 8 WEEKS 11/25/23   Alba Sharper, MD  furosemide  (LASIX ) 40 MG tablet Take 2 tablets (80 mg total) by mouth daily. May take an additional 80 mg if weight is greater 3 lbs or more 12/30/23   Anner Alm ORN, MD  methocarbamol  (ROBAXIN ) 750 MG tablet Take 1 tablet (750 mg total) by mouth at bedtime. Patient not taking: Reported on 12/30/2023 09/12/23   Everhart,  Kirstie, DO  norgestimate -ethinyl estradiol  (SPRINTEC 28) 0.25-35 MG-MCG tablet Take 1 tablet by mouth daily. 01/10/24   Theophilus Pagan, MD  potassium chloride  (KLOR-CON ) 20 MEQ packet Take 20 mEq by mouth daily. Take an  additional 20 meq when taking  extra furosemide  12/30/23   Anner Alm ORN, MD    Allergies: Tramadol     Review of Systems  Constitutional:  Negative for chills and fever.  Respiratory:  Positive for cough and shortness of breath.   Cardiovascular:  Positive for palpitations and leg swelling (Chronic but not worsening). Negative for chest pain.  Gastrointestinal:  Negative for abdominal pain, constipation, diarrhea, nausea and vomiting.  Neurological:  Negative for dizziness, syncope and light-headedness.  See HPI  Updated Vital Signs BP 113/70   Pulse (!) 105   Temp 98.7 F (37.1 C) (Oral)   Resp 19   LMP 02/14/2024   SpO2 95%   Physical Exam Vitals and nursing note reviewed.  Constitutional:      General: She is not in acute distress.    Appearance: She is obese. She is not ill-appearing or toxic-appearing.  Cardiovascular:     Rate and Rhythm: Tachycardia present.     Comments: Mild tachycardia Pulmonary:     Effort: Pulmonary effort is normal.     Breath sounds: Normal breath sounds. No decreased breath sounds.  Abdominal:  Palpations: Abdomen is soft.     Tenderness: There is no abdominal tenderness.  Musculoskeletal:     Right lower leg: Edema present.     Left lower leg: Edema present.     Comments: Chronic lower leg edema  Skin:    General: Skin is warm and dry.  Neurological:     Mental Status: She is alert.     (all labs ordered are listed, but only abnormal results are displayed) Labs Reviewed  BASIC METABOLIC PANEL WITH GFR - Abnormal; Notable for the following components:      Result Value   Chloride 97 (*)    Glucose, Bld 154 (*)    Creatinine, Ser 1.09 (*)    All other components within normal limits  PRO BRAIN NATRIURETIC  PEPTIDE - Abnormal; Notable for the following components:   Pro Brain Natriuretic Peptide 3,470.0 (*)    All other components within normal limits  TROPONIN T, HIGH SENSITIVITY - Abnormal; Notable for the following components:   Troponin T High Sensitivity 27 (*)    All other components within normal limits  CBC  TSH  TROPONIN T, HIGH SENSITIVITY    EKG: EKG Interpretation Date/Time:  Monday March 02 2024 13:25:43 EST Ventricular Rate:  103 PR Interval:  152 QRS Duration:  112 QT Interval:  392 QTC Calculation: 514 R Axis:   98  Text Interpretation: Sinus tachycardia Biatrial enlargement Incomplete right bundle branch block Prolonged QT interval No significant change since last tracing Confirmed by Dasie Faden ([id number]) on 03/02/2024 1:33:56 PM  Radiology: CT Angio Chest PE W and/or Wo Contrast Result Date: 03/02/2024 EXAM: CTA CHEST 03/02/2024 03:00:27 PM TECHNIQUE: CTA of the chest was performed after the administration of 80 mL of iohexol  (OMNIPAQUE ) 350 MG/ML injection. Multiplanar reformatted images are provided for review. MIP images are provided for review. Automated exposure control, iterative reconstruction, and/or weight based adjustment of the mA/kV was utilized to reduce the radiation dose to as low as reasonably achievable. COMPARISON: 12/09/2023 CLINICAL HISTORY: Shortness of breath and cardiac palpitations. FINDINGS: PULMONARY ARTERIES: Pulmonary arteries are adequately opacified for evaluation. Acute pulmonary embolus observed in subsegmental right upper lobe branches, in the medial segment right middle lobe, and subsegmental branches of the lateral segment right middle lobe; and in segmental and subsegmental branches of the right lower lobe pulmonary arterial tree. Left pulmonary artery acute pulmonary embolus extends in the upper lobe and lower lobe branches. Right ventricular to left ventricular ratio is 1.26, abnormally elevated. Chronic substantial main pulmonary  artery enlargement compatible with pulmonary arterial hypertension. The elevated right ventricular to left ventricular ratio was also present on 12/09/2023 despite the absence of pulmonary embolus on that exam. MEDIASTINUM: Right heart enlargement. Moderate chronic cardiomegaly is present. There is some reflux of contrast into the hepatic veins compatible with right heart strain. There is no acute abnormality of the thoracic aorta. LYMPH NODES: Scattered small and distinct mediastinal lymph nodes. No hilar or axillary lymphadenopathy. LUNGS AND PLEURA: Nonspecific mosaic attenuation in the lungs although air trapping is favored over chronic pulmonary embolus. No focal consolidation or pulmonary edema. No evidence of pleural effusion or pneumothorax. UPPER ABDOMEN: Limited images of the upper abdomen are unremarkable. SOFT TISSUES AND BONES: No acute bone or soft tissue abnormality. IMPRESSION: 1. Acute pulmonary emboli involving right upper lobe, right middle lobe, right lower lobe, and left upper and lower lobe branches. 2. Right heart strain with elevated right ventricular to left ventricular ratio of 1.26 and right heart  enlargement, with reflux of contrast into hepatic veins. Appearance concerning for at least submassive pulmonary embolus and right heart strain. 3. Chronic substantial main pulmonary artery enlargement compatible with pulmonary arterial hypertension. 4. Chronic cardiomegaly. 5. Nonspecific mosaic attenuation in the lungs, favoring air trapping over chronic pulmonary embolus. I discussed this critical value by telephone with provider Carlo Cornish at 03:17 pm on 03/02/2024. Electronically signed by: Ryan Salvage MD 03/02/2024 03:24 PM EST RP Workstation: [id number]   DG Chest 2 View Result Date: 03/02/2024 EXAM: 2 VIEW(S) XRAY OF THE CHEST 03/02/2024 01:45:31 PM COMPARISON: 12/09/2023 CLINICAL HISTORY: SOB FINDINGS: LUNGS AND PLEURA: No interstitial edema or airspace consolidation. No  pleural fluid. No pneumothorax. HEART AND MEDIASTINUM: Cardiac enlargement, unchanged from previous exam. BONES AND SOFT TISSUES: No acute osseous abnormality. IMPRESSION: 1. No acute cardiopulmonary findings. Electronically signed by: Waddell Calk MD 03/02/2024 02:39 PM EST RP Workstation: [id number]   .Critical Care  Performed by: Cornish Carlo, PA-C Authorized by: Cornish Carlo, PA-C   Critical care provider statement:    Critical care time (minutes):  45   Critical care was necessary to treat or prevent imminent or life-threatening deterioration of the following conditions:  Circulatory failure   Critical care was time spent personally by me on the following activities:  Development of treatment plan with patient or surrogate, discussions with consultants, evaluation of patient's response to treatment, examination of patient, ordering and review of laboratory studies, ordering and review of radiographic studies, ordering and performing treatments and interventions, pulse oximetry, re-evaluation of patient's condition, review of old charts and obtaining history from patient or surrogate   Care discussed with: admitting provider   Comments:     Submassive PE    Medications Ordered in the ED  iohexol  (OMNIPAQUE ) 350 MG/ML injection 75 mL (80 mLs Intravenous Contrast Given 03/02/24 1449)    Clinical Course as of 03/02/24 1740  Mon Mar 02, 2024  1515 Critical read from radiology [RR]  1603 Spoke with critical care who will come and see the patient.  [RR]  1636 Critical care has seen the patient. Do not feel that IR is needed. Recommends admission to hospitalist team.  [RR]    Clinical Course User Index [RR] Cornish Carlo, PA-C   Medical Decision Making Amount and/or Complexity of Data Reviewed Labs: ordered. Radiology: ordered.  Risk Prescription drug management. Decision regarding hospitalization.   38 y.o. female presents to the ER for evaluation of shortness of breath with  palpitations. Differential diagnosis includes but is not limited to CHF, pericardial effusion/tamponade, arrhythmias, ACS, COPD, asthma, bronchitis, pneumonia, pneumothorax, PE, anemia. Vital signs mild tachycardia otherwise unremarkable. Physical exam as noted above.   Patient is PERC positive, and given the complaint about her leg as well as now the new onset of tachycardia and feeling the palpitations and shortness of breath, I am concerned.  Will forego D-dimer and order CT angio of the chest to rule out any pulmonary embolism.  I independently reviewed and interpreted the patient's labs.  BMP shows glucose of 154, chloride 97, creatinine 1.09.  PT/INR within normal limits.  CBC without leukocytosis or anemia.  I-STAT lactic acid normal.  proBNP is slightly elevated at 3470.  Initial troponin at 27 with repeat at 29, no previous in comparison to this.  CXR 1. No acute cardiopulmonary findings.   CTA 1. Acute pulmonary emboli involving right upper lobe, right middle lobe, right lower lobe, and left upper and lower lobe branches. 2. Right heart strain with elevated right  ventricular to left ventricular ratio of 1.26 and right heart enlargement, with reflux of contrast into hepatic veins. Appearance concerning for at least submassive pulmonary embolus and right heart strain. 3. Chronic substantial main pulmonary artery enlargement compatible with pulmonary arterial hypertension. 4. Chronic cardiomegaly. 5. Nonspecific mosaic attenuation in the lungs, favoring air trapping over chronic pulmonary embolus. Per radiologist's interpretation.  Heparin  drip initiated.  I consulted critical care after meeting criteria for the submassive PE.  I spoke with Dr. Theophilus who assessed the patient at bedside. Does not feel that she meets IR criteria and will allow hospitalist to admit to continue heparin .  I did order bilateral vascular studies of the legs given the complaint of the vein distention on her left thigh.   Patient will need admission regardless.  I discussed results with patient at bedside.  She is agreeable to admission.  Will admit to hospitalist team.  Portions of this report may have been transcribed using voice recognition software. Every effort was made to ensure accuracy; however, inadvertent computerized transcription errors may be present.    Final diagnoses:  Other acute pulmonary embolism with acute cor pulmonale [facility])    ED Discharge Orders     None          Bernis Ernst, [place] 03/02/24 1750    Dasie Faden, MD 03/03/24 1258

## 2024-03-02 NOTE — ED Provider Triage Note (Signed)
 Emergency Medicine Provider Triage Evaluation Note  [name] , a 38 y.o. female  was evaluated in triage.  Pt complains of shortness of breath the past week.  On Friday she started to feel palpitations.  She reports that she felt a vein pop in her left lower leg.  She has not had fever.  Reports has had a chronic cough of the past year.  Was a former smoker.  No fevers.  No abdominal pain, nausea, vomiting, lightheadedness, dizziness. She is on exogenous hormones  Review of Systems  Positive:  Negative:   Physical Exam  BP 129/72 (BP Location: Right Arm)   Pulse 89   Temp 98.7 F (37.1 C) (Oral)   Resp 20   SpO2 98%  Gen:   Awake, no distress   Resp:  Normal effort  MSK:   Moves extremities without difficulty  Other:  Speaking in full sentences. Tachycardia  Medical Decision Making  Medically screening exam initiated at 1:38 PM.  Appropriate orders placed.  [name] was informed that the remainder of the evaluation will be completed by another provider, this initial triage assessment does not replace that evaluation, and the importance of remaining in the ED until their evaluation is complete.  Due to her story, presentation, as well as other risk factors, do feel CT angio for PE study is needed at this time.  Her EKG is concerning.  Nursing aware that patient's next to room   Bernis Ernst, PA-C 03/02/24 1340

## 2024-03-02 NOTE — Progress Notes (Signed)
 PHARMACY - ANTICOAGULATION CONSULT NOTE  Pharmacy Consult for IV UFH Indication: pulmonary embolus  Allergies  Allergen Reactions   Tramadol  Other (See Comments)    Did not make her feel like herself    Patient Measurements: Height: 5' 6 (167.6 cm) Weight: (!) 186.8 kg (411 lb 13.1 oz) IBW/kg (Calculated) : 59.3 HEPARIN  DW (KG): 107.9  Vital Signs: Temp: 98.7 F (37.1 C) (11/17 1311) Temp Source: Oral (11/17 1311) BP: 113/70 (11/17 1430) Pulse Rate: 105 (11/17 1430)  Labs: Recent Labs    03/02/24 1330  HGB 14.5  HCT 45.9  PLT 205  CREATININE 1.09*    Estimated Creatinine Clearance: 121.9 mL/min (A) (by C-G formula based on SCr of 1.09 mg/dL (H)).   Medical History: Past Medical History:  Diagnosis Date   Ankle ulcer (HCC)    right   Birth control counseling 01/25/2014   Childhood asthma    Chronic lower back pain    Encounter for counseling regarding contraception 05/03/2023   GERD (gastroesophageal reflux disease)    Headache    only when I'm hungry (10/14/2015)   History of COVID-19 12/22/2019   History of iritis 10/08/2013   Hypertension    Obesity    Pap smear for cervical cancer screening 01/06/2018   Varicose veins    VSD (ventricular septal defect)    had hole in bottom of her heart; closed up on it's own; no OR /mom 10/14/2015)    Medications:  (Not in a hospital admission)  Scheduled:  Infusions:  PRN:   Assessment: 38 yo presented with shortness of breath found to have new PE with concern for right heart strain. Patient not on anticoagulation prior to admission but is on birth control medication. Baseline CBC and renal function already done.   Goal of Therapy:  Heparin  level 0.3-0.7 units/ml Monitor platelets by anticoagulation protocol: Yes   Plan:  IV heparin  bolus of 4000 units Then IV heparin  rate of 2200 units/hr Check heparin  level in 6 hours Daily CBC  Britta Eva Na 03/02/2024,3:31 PM

## 2024-03-03 ENCOUNTER — Inpatient Hospital Stay (HOSPITAL_COMMUNITY)

## 2024-03-03 DIAGNOSIS — R0602 Shortness of breath: Secondary | ICD-10-CM

## 2024-03-03 DIAGNOSIS — I2609 Other pulmonary embolism with acute cor pulmonale: Secondary | ICD-10-CM | POA: Diagnosis not present

## 2024-03-03 DIAGNOSIS — R52 Pain, unspecified: Secondary | ICD-10-CM

## 2024-03-03 DIAGNOSIS — I2699 Other pulmonary embolism without acute cor pulmonale: Secondary | ICD-10-CM | POA: Diagnosis not present

## 2024-03-03 LAB — BASIC METABOLIC PANEL WITH GFR
Anion gap: 12 (ref 5–15)
BUN: 16 mg/dL (ref 6–20)
CO2: 29 mmol/L (ref 22–32)
Calcium: 9.1 mg/dL (ref 8.9–10.3)
Chloride: 98 mmol/L (ref 98–111)
Creatinine, Ser: 0.89 mg/dL (ref 0.44–1.00)
GFR, Estimated: >60 mL/min (ref >60)
Glucose, Bld: 143 mg/dL — ABNORMAL HIGH (ref 70–99)
Potassium: 3.5 mmol/L (ref 3.5–5.1)
Sodium: 139 mmol/L (ref 135–145)

## 2024-03-03 LAB — CBC
HCT: 45.1 % (ref 36.0–46.0)
Hemoglobin: 13.7 g/dL (ref 12.0–15.0)
MCH: 28.8 pg (ref 26.0–34.0)
MCHC: 30.4 g/dL (ref 30.0–36.0)
MCV: 94.9 fL (ref 80.0–100.0)
Platelets: 207 K/uL (ref 150–400)
RBC: 4.75 MIL/uL (ref 3.87–5.11)
RDW: 14.5 % (ref 11.5–15.5)
WBC: 8.6 K/uL (ref 4.0–10.5)
nRBC: 0 % (ref 0.0–0.2)

## 2024-03-03 LAB — MAGNESIUM: Magnesium: 2.3 mg/dL (ref 1.7–2.4)

## 2024-03-03 LAB — TSH: TSH: 2.76 u[IU]/mL (ref 0.350–4.500)

## 2024-03-03 LAB — HEPARIN LEVEL (UNFRACTIONATED)
Heparin Unfractionated: 0.6 [IU]/mL (ref 0.30–0.70)
Heparin Unfractionated: 0.45 [IU]/mL (ref 0.30–0.70)

## 2024-03-03 MED ORDER — MENTHOL 3 MG MT LOZG
1.0000 | LOZENGE | OROMUCOSAL | Status: DC | PRN
  Administered 2024-03-03: 3 mg via ORAL
  Filled 2024-03-03: qty 9

## 2024-03-03 MED ORDER — POTASSIUM CHLORIDE CRYS ER 20 MEQ PO TBCR
40.0000 meq | EXTENDED_RELEASE_TABLET | Freq: Once | ORAL | Status: AC
  Administered 2024-03-03: 40 meq via ORAL
  Filled 2024-03-03: qty 2

## 2024-03-03 MED ORDER — GUAIFENESIN-DM 100-10 MG/5ML PO SYRP
5.0000 mL | ORAL_SOLUTION | ORAL | Status: DC | PRN
  Administered 2024-03-03 – 2024-03-07 (×6): 5 mL via ORAL
  Filled 2024-03-03 (×7): qty 10

## 2024-03-03 NOTE — Progress Notes (Signed)
   03/03/24 1433  Spiritual Encounters  Type of Visit Initial  Care provided to: Patient  Referral source Chaplain team  Reason for visit Advance directives  OnCall Visit No   Responded to consult for HCPOA. Visited with patient and provided education. Patient will discuss with family members and return completed if they decide it is what they want to do.

## 2024-03-03 NOTE — Plan of Care (Signed)
   Problem: Clinical Measurements: Goal: Respiratory complications will improve Outcome: Progressing   Problem: Activity: Goal: Risk for activity intolerance will decrease Outcome: Progressing   Problem: Safety: Goal: Ability to remain free from injury will improve Outcome: Progressing

## 2024-03-03 NOTE — Progress Notes (Signed)
 PHARMACY - ANTICOAGULATION CONSULT NOTE  Pharmacy Consult for Heparin  Indication: pulmonary embolus  Allergies  Allergen Reactions   Tramadol  Other (See Comments)    Made the patient feel not like herself    Patient Measurements: Height: 5' 6 (167.6 cm) Weight: (!) 186.8 kg (411 lb 13.1 oz) IBW/kg (Calculated) : 59.3 HEPARIN  DW (KG): 107.9  Vital Signs: Temp: 98.2 F (36.8 C) (11/18 0508) Temp Source: Oral (11/18 0508) BP: 112/63 (11/18 0508) Pulse Rate: 103 (11/18 0508)  Labs: Recent Labs    03/02/24 1330 03/02/24 1545 03/02/24 2209 03/03/24 0539  HGB 14.5  --   --  13.7  HCT 45.9  --   --  45.1  PLT 205  --   --  207  APTT  --  29  --   --   LABPROT  --  14.2  --   --   INR  --  1.0  --   --   HEPARINUNFRC  --  <0.10* 0.22* 0.60  CREATININE 1.09*  --   --  0.89    Estimated Creatinine Clearance: 149.2 mL/min (by C-G formula based on SCr of 0.89 mg/dL).   Medical History: Past Medical History:  Diagnosis Date   Ankle ulcer (HCC)    right   Birth control counseling 01/25/2014   Childhood asthma    Chronic lower back pain    Encounter for counseling regarding contraception 05/03/2023   GERD (gastroesophageal reflux disease)    Headache    only when I'm hungry (10/14/2015)   History of COVID-19 12/22/2019   History of iritis 10/08/2013   Hypertension    Obesity    Pap smear for cervical cancer screening 01/06/2018   Varicose veins    VSD (ventricular septal defect)    had hole in bottom of her heart; closed up on it's own; no OR /mom 10/14/2015)    Assessment:  AC/Heme: PE with right heart strain - start IV UFH CBC WNL, Heparin  level 0.6 in goal range.  Goal of Therapy:  Heparin  level 0.3-0.7 units/ml Monitor platelets by anticoagulation protocol: Yes   Plan:  Con't IV heparin  at 2400 units/hr Confirm heparin  level in 6 hrs. Daily HL and CBC   Thien Berka Karoline Marina, PharmD, BCPS Clinical Staff Pharmacist Marina Salines  [place] 03/03/2024,7:52 AM

## 2024-03-03 NOTE — TOC Initial Note (Addendum)
 Transition of Care [facility]) - Initial/Assessment Note    Patient Details  Name: [name] MRN: [id number] Date of Birth: 1985-05-07  [facility] CM/SW Contact:    Bascom Service, RN Phone Number: 03/03/2024, 2:39 PM  Clinical Narrative: d/c plan home. Has own transport home. Noted weaning 50.  Referral for med asst eliquis-patient has health insurance w/obligated co pay.                Expected Discharge Plan: Home/Self Care Barriers to Discharge: Continued Medical Work up   Patient Goals and CMS Choice Patient states their goals for this hospitalization and ongoing recovery are:: Home CMS Medicare.gov Compare Post Acute Care list provided to:: Patient Choice offered to / list presented to : Patient [place] ownership interest in [facility].provided to:: Patient    Expected Discharge Plan and Services   Discharge Planning Services: CM Consult   Living arrangements for the past 2 months: Single Family Home                                      Prior Living Arrangements/Services Living arrangements for the past 2 months: Single Family Home Lives with:: Friends                   Activities of Daily Living   ADL Screening (condition at time of admission) Independently performs ADLs?: Yes (appropriate for developmental age) Is the patient deaf or have difficulty hearing?: No Does the patient have difficulty seeing, even when wearing glasses/contacts?: No Does the patient have difficulty concentrating, remembering, or making decisions?: No  Permission Sought/Granted                  Emotional Assessment              Admission diagnosis:  Other acute pulmonary embolism with acute cor pulmonale (HCC) [I26.09] Acute pulmonary embolism with acute cor pulmonale (HCC) [I26.09] Patient Active Problem List   Diagnosis Date Noted   Acute pulmonary embolism with acute cor pulmonale (HCC) 03/02/2024   Wrist pain 01/02/2024    Pulmonary hypertension, unspecified (HCC) 12/30/2023   Demand ischemia (HCC) 09/03/2023   Chronic diastolic CHF (congestive heart failure), NYHA class 2 (HCC) 09/01/2023   Acute respiratory failure with hypoxia (HCC) 09/01/2023   Vaginal itching 05/03/2023   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 01/28/2023   General counseling and advice on contraceptive management 12/13/2022   Prediabetes 06/22/2022   Irregular periods 04/12/2022   Biceps tendinitis of right upper extremity 07/10/2020   Cramp and spasm 03/18/2020   Seasonal allergies 12/22/2019   Gastroesophageal reflux disease 01/06/2018   Orthopnea 12/12/2016   Suspected sleep apnea 12/12/2016   Plantar fasciitis 12/12/2016   Umbilical hernia 10/14/2015   Low back pain 04/23/2014   Essential hypertension, benign 11/16/2013   Menorrhagia 10/08/2013   Varicose veins of bilateral lower extremities with other complications 10/25/2011   Morbid obesity with BMI of 60.0-69.9, adult (HCC) 10/25/2011   PCP:  Alba Sharper, MD Pharmacy:   [id number] - [place],  - [address] AT [facility] [address] [place] [address] Phone: [phone] Fax: [phone]     Social Drivers of Health (SDOH) Social History: SDOH Screenings   Food Insecurity: No Food Insecurity (03/02/2024)  Housing: Low Risk  (03/02/2024)  Transportation Needs: No Transportation Needs (03/02/2024)  Utilities: Not At  Risk (03/02/2024)  Depression (PHQ2-9): Low Risk  (01/10/2024)  Recent Concern: Depression (PHQ2-9) - Medium Risk (10/21/2023)  Social Connections: Socially Integrated (09/01/2023)  Tobacco Use: Low Risk  (03/02/2024)   SDOH Interventions:     Readmission Risk Interventions    09/01/2023    9:43 AM  Readmission Risk Prevention Plan  Post Dischage Appt Complete  Medication Screening Complete  Transportation Screening Complete

## 2024-03-03 NOTE — Progress Notes (Signed)
 PHARMACY - ANTICOAGULATION CONSULT NOTE  Pharmacy Consult for Heparin  Indication: pulmonary embolus  Allergies  Allergen Reactions   Tramadol  Other (See Comments)    Made the patient feel not like herself    Patient Measurements: Height: 5' 6 (167.6 cm) Weight: (!) 186.8 kg (411 lb 13.1 oz) IBW/kg (Calculated) : 59.3 HEPARIN  DW (KG): 107.9  Vital Signs: Temp: 97.6 F (36.4 C) (11/18 1240) Temp Source: Oral (11/18 1240) BP: 103/79 (11/18 1240) Pulse Rate: 91 (11/18 1240)  Labs: Recent Labs    03/02/24 1330 03/02/24 1545 03/02/24 2209 03/03/24 0539  HGB 14.5  --   --  13.7  HCT 45.9  --   --  45.1  PLT 205  --   --  207  APTT  --  29  --   --   LABPROT  --  14.2  --   --   INR  --  1.0  --   --   HEPARINUNFRC  --  <0.10* 0.22* 0.60  CREATININE 1.09*  --   --  0.89    Estimated Creatinine Clearance: 149.2 mL/min (by C-G formula based on SCr of 0.89 mg/dL).   Medical History: Past Medical History:  Diagnosis Date   Ankle ulcer (HCC)    right   Birth control counseling 01/25/2014   Childhood asthma    Chronic lower back pain    Encounter for counseling regarding contraception 05/03/2023   GERD (gastroesophageal reflux disease)    Headache    only when I'm hungry (10/14/2015)   History of COVID-19 12/22/2019   History of iritis 10/08/2013   Hypertension    Obesity    Pap smear for cervical cancer screening 01/06/2018   Varicose veins    VSD (ventricular septal defect)    had hole in bottom of her heart; closed up on it's own; no OR /mom 10/14/2015)    Assessment: 38 yo presented with shortness of breath found to have new PE with concern for right heart strain. Patient not on anticoagulation prior to admission but is on birth control medication. Baseline CBC and renal function already done. Pharmacy consulted for heparin  management.   Today, 03/03/24 Confirmatory heparin  level = 0.45, therapeutic on infusion running at 2400 units/hr CBC WNL No  infusion complications or signs of bleeding documented  Goal of Therapy:  Heparin  level 0.3-0.7 units/ml Monitor platelets by anticoagulation protocol: Yes   Plan:  Continue IV heparin  at 2400 units/hr Monitor heparin  level and CBC daily  Continue to monitor for s/sx of bleeding   Thank you for allowing pharmacy to be a part of this patient's care.  Marget Hench, PharmD Clinical Pharmacist 03/03/2024 2:37 PM

## 2024-03-03 NOTE — Progress Notes (Signed)
 PHARMACY - ANTICOAGULATION CONSULT NOTE  Pharmacy Consult for IV UFH Indication: pulmonary embolus  Allergies  Allergen Reactions   Tramadol  Other (See Comments)    Made the patient feel not like herself    Patient Measurements: Height: 5' 6 (167.6 cm) Weight: (!) 186.8 kg (411 lb 13.1 oz) IBW/kg (Calculated) : 59.3 HEPARIN  DW (KG): 107.9  Vital Signs: Temp: 98.2 F (36.8 C) (11/18 0508) Temp Source: Oral (11/18 0508) BP: 112/63 (11/18 0508) Pulse Rate: 103 (11/18 0508)  Labs: Recent Labs    03/02/24 1330 03/02/24 1545 03/02/24 2209 03/03/24 0539  HGB 14.5  --   --  13.7  HCT 45.9  --   --  45.1  PLT 205  --   --  207  APTT  --  29  --   --   LABPROT  --  14.2  --   --   INR  --  1.0  --   --   HEPARINUNFRC  --  <0.10* 0.22* 0.60  CREATININE 1.09*  --   --   --     Estimated Creatinine Clearance: 121.9 mL/min (A) (by C-G formula based on SCr of 1.09 mg/dL (H)).   Medical History: Past Medical History:  Diagnosis Date   Ankle ulcer (HCC)    right   Birth control counseling 01/25/2014   Childhood asthma    Chronic lower back pain    Encounter for counseling regarding contraception 05/03/2023   GERD (gastroesophageal reflux disease)    Headache    only when I'm hungry (10/14/2015)   History of COVID-19 12/22/2019   History of iritis 10/08/2013   Hypertension    Obesity    Pap smear for cervical cancer screening 01/06/2018   Varicose veins    VSD (ventricular septal defect)    had hole in bottom of her heart; closed up on it's own; no OR /mom 10/14/2015)    Medications:  Medications Prior to Admission  Medication Sig Dispense Refill Last Dose/Taking   albuterol  (VENTOLIN  HFA) 108 (90 Base) MCG/ACT inhaler Inhale 2 puffs into the lungs every 6 (six) hours as needed for wheezing or shortness of breath. 8 g 2 03/02/2024 Morning   Cholecalciferol  ([employer] D3) 125 MCG (5000 UT) capsule TAKE 1 TABLET BY MOUTH ONCE A WEEK. FOR 8 WEEKS (Patient taking  differently: Take 5,000 Units by mouth every 7 (seven) days.) 100 capsule 0 Past Week   furosemide  (LASIX ) 40 MG tablet Take 2 tablets (80 mg total) by mouth daily. May take an additional 80 mg if weight is greater 3 lbs or more (Patient taking differently: Take 80 mg by mouth See admin instructions. Take 80 mg by mouth in the morning and may take an additional 80 mg if weight is greater 3 lbs or more within a period of a couple of days) 220 tablet 3 03/01/2024 Morning   ibuprofen  (ADVIL ) 800 MG tablet Take 800 mg by mouth every 8 (eight) hours as needed for mild pain (pain score 1-3) or headache.   Unknown   methocarbamol  (ROBAXIN ) 500 MG tablet Take 500 mg by mouth every 6 (six) hours as needed for muscle spasms.   Unknown   potassium chloride  (KLOR-CON ) 20 MEQ packet Take 20 mEq by mouth daily. Take an  additional 20 meq when taking  extra furosemide  (Patient taking differently: Take 20 mEq by mouth See admin instructions. Dissolve 20 mEq as directed and drink by mouth in the morning and an additional 20 mEq once a  day when taking extra Furosemide ) 180 packet 3 Past Week   TYLENOL  8 HOUR ARTHRITIS PAIN 650 MG CR tablet Take 650-1,300 mg by mouth every 8 (eight) hours as needed for pain.   Unknown   methocarbamol  (ROBAXIN ) 750 MG tablet Take 1 tablet (750 mg total) by mouth at bedtime. (Patient not taking: Reported on 03/02/2024) 30 tablet 3 Not Taking   norgestimate -ethinyl estradiol  (SPRINTEC 28) 0.25-35 MG-MCG tablet Take 1 tablet by mouth daily. (Patient not taking: Reported on 03/02/2024) 28 tablet 11 Not Taking     Assessment: 38 yo female presented to the ED on 03/02/24 with shortness of breath. CTA revealed new PE with concern for right heart strain. Patient not on anticoagulation prior to admission but is on birth control medication.    03/03/24 Heparin  level is 0.6, therapeutic at 2400 units/hr CBC WNL RN confirmed no interruption in therapy; no bleeding complications reported  Goal of  Therapy:  Heparin  level 0.3-0.7 units/ml Monitor platelets by anticoagulation protocol: Yes   Plan:  Continue heparin  IV infusion at 2400 units/hr Check heparin  level in 6 hours Daily CBC Continue to monitor for signs and symptoms of bleeding  Aleck Freiberg, PharmD Candidate  03/03/2024,7:22 AM

## 2024-03-03 NOTE — Progress Notes (Signed)
 PROGRESS NOTE    [name]  FMW:[id number] DOB: [birth year] DOA: 03/02/2024 PCP: Alba Sharper, MD   Brief Narrative:  38 year old female with history of morbid obesity and chronic diastolic heart failure presented with worsening shortness of breath.  On presentation, CTA chest showed bilateral PEs with right heart strain.  She was started on heparin  drip.  Pulmonary was consulted.  Assessment & Plan:   Bilateral segmental and segmental pulmonary emboli with possible right heart strain Acute respite failure with hypoxia -Possibly due to recently being started on birth control pills.  Also had a recent 6 or car trip to [place]  and back.  Imaging as above.  Currently on heparin  drip.  Pulmonary following.  Follow 2D echo and lower extremity duplex - Currently requiring 2 L oxygen via nasal cannula intermittently.  Wean off as able  Chronic diastolic heart failure - Strict input and output.  Daily weights.  Fluid restriction.  Continue oral Lasix   Probable sleep apnea -Outpatient follow-up with pulmonary for outpatient sleep study  Obesity class III - Outpatient follow-up  DVT prophylaxis: Heparin  drip Code Status: Full Family Communication: None at bedside Disposition Plan: Status is: Inpatient Remains inpatient appropriate because: Of severity of illness    Consultants: Pulmonary  Procedures: None  Antimicrobials: None   Subjective: Patient seen and examined at bedside.  Complains of mild shortness of breath with exertion.  Denies any current chest pain, fever or vomiting.  Objective: Vitals:   03/02/24 1800 03/02/24 2115 03/03/24 0215 03/03/24 0508  BP:  123/67 (!) 101/40 112/63  Pulse:  100 99 (!) 103  Resp:  16 17 17   Temp:  98.9 F (37.2 C) 97.9 F (36.6 C) 98.2 F (36.8 C)  TempSrc:  Oral Oral Oral  SpO2:  95% 92% 91%  Weight: (!) 186.8 kg     Height: 5' 6 (1.676 m)       Intake/Output Summary (Last 24 hours) at 03/03/2024 0719 Last data  filed at 03/02/2024 2100 Gross per 24 hour  Intake 240 ml  Output --  Net 240 ml   Filed Weights   03/02/24 1500 03/02/24 1800  Weight: (!) 186.8 kg (!) 186.8 kg    Examination:  General exam: Appears calm and comfortable. Respiratory system: Bilateral decreased breath sounds at bases with scattered crackles Cardiovascular system: S1 & S2 heard, Rate controlled Gastrointestinal system: Abdomen is morbidly obese, nondistended, soft and nontender. Normal bowel sounds heard. Extremities: No cyanosis, clubbing; trace lower extremity edema Central nervous system: Alert and oriented. No focal neurological deficits. Moving extremities Skin: No rashes, lesions or ulcers Psychiatry: Judgement and insight appear normal. Mood & affect appropriate.     Data Reviewed: I have personally reviewed following labs and imaging studies  CBC: Recent Labs  Lab 03/02/24 1330 03/03/24 0539  WBC 8.4 8.6  HGB 14.5 13.7  HCT 45.9 45.1  MCV 92.7 94.9  PLT 205 207   Basic Metabolic Panel: Recent Labs  Lab 03/02/24 1330  NA 138  K 3.8  CL 97*  CO2 31  GLUCOSE 154*  BUN 13  CREATININE 1.09*  CALCIUM 9.2   GFR: Estimated Creatinine Clearance: 121.9 mL/min (A) (by C-G formula based on SCr of 1.09 mg/dL (H)). Liver Function Tests: No results for input(s): AST, ALT, ALKPHOS, BILITOT, PROT, ALBUMIN in the last 168 hours. No results for input(s): LIPASE, AMYLASE in the last 168 hours. No results for input(s): AMMONIA in the last 168 hours. Coagulation Profile: Recent Labs  Lab 03/02/24  1545  INR 1.0   Cardiac Enzymes: No results for input(s): CKTOTAL, CKMB, CKMBINDEX, TROPONINI in the last 168 hours. BNP (last 3 results) Recent Labs    03/02/24 1330  PROBNP 3,470.0*   HbA1C: No results for input(s): HGBA1C in the last 72 hours. CBG: No results for input(s): GLUCAP in the last 168 hours. Lipid Profile: No results for input(s): CHOL, HDL,  LDLCALC, TRIG, CHOLHDL, LDLDIRECT in the last 72 hours. Thyroid  Function Tests: Recent Labs    03/02/24 1928  TSH 2.760   Anemia Panel: No results for input(s): VITAMINB12, FOLATE, FERRITIN, TIBC, IRON, RETICCTPCT in the last 72 hours. Sepsis Labs: Recent Labs  Lab 03/02/24 1556  LATICACIDVEN 1.4    No results found for this or any previous visit (from the past 240 hours).       Radiology Studies: CT Angio Chest PE W and/or Wo Contrast Result Date: 03/02/2024 EXAM: CTA CHEST 03/02/2024 03:00:27 PM TECHNIQUE: CTA of the chest was performed after the administration of 80 mL of iohexol  (OMNIPAQUE ) 350 MG/ML injection. Multiplanar reformatted images are provided for review. MIP images are provided for review. Automated exposure control, iterative reconstruction, and/or weight based adjustment of the mA/kV was utilized to reduce the radiation dose to as low as reasonably achievable. COMPARISON: 12/09/2023 CLINICAL HISTORY: Shortness of breath and cardiac palpitations. FINDINGS: PULMONARY ARTERIES: Pulmonary arteries are adequately opacified for evaluation. Acute pulmonary embolus observed in subsegmental right upper lobe branches, in the medial segment right middle lobe, and subsegmental branches of the lateral segment right middle lobe; and in segmental and subsegmental branches of the right lower lobe pulmonary arterial tree. Left pulmonary artery acute pulmonary embolus extends in the upper lobe and lower lobe branches. Right ventricular to left ventricular ratio is 1.26, abnormally elevated. Chronic substantial main pulmonary artery enlargement compatible with pulmonary arterial hypertension. The elevated right ventricular to left ventricular ratio was also present on 12/09/2023 despite the absence of pulmonary embolus on that exam. MEDIASTINUM: Right heart enlargement. Moderate chronic cardiomegaly is present. There is some reflux of contrast into the hepatic veins  compatible with right heart strain. There is no acute abnormality of the thoracic aorta. LYMPH NODES: Scattered small and distinct mediastinal lymph nodes. No hilar or axillary lymphadenopathy. LUNGS AND PLEURA: Nonspecific mosaic attenuation in the lungs although air trapping is favored over chronic pulmonary embolus. No focal consolidation or pulmonary edema. No evidence of pleural effusion or pneumothorax. UPPER ABDOMEN: Limited images of the upper abdomen are unremarkable. SOFT TISSUES AND BONES: No acute bone or soft tissue abnormality. IMPRESSION: 1. Acute pulmonary emboli involving right upper lobe, right middle lobe, right lower lobe, and left upper and lower lobe branches. 2. Right heart strain with elevated right ventricular to left ventricular ratio of 1.26 and right heart enlargement, with reflux of contrast into hepatic veins. Appearance concerning for at least submassive pulmonary embolus and right heart strain. 3. Chronic substantial main pulmonary artery enlargement compatible with pulmonary arterial hypertension. 4. Chronic cardiomegaly. 5. Nonspecific mosaic attenuation in the lungs, favoring air trapping over chronic pulmonary embolus. I discussed this critical value by telephone with provider Carlo Cornish at 03:17 pm on 03/02/2024. Electronically signed by: Ryan Salvage MD 03/02/2024 03:24 PM EST RP Workstation: [id number]   DG Chest 2 View Result Date: 03/02/2024 EXAM: 2 VIEW(S) XRAY OF THE CHEST 03/02/2024 01:45:31 PM COMPARISON: 12/09/2023 CLINICAL HISTORY: SOB FINDINGS: LUNGS AND PLEURA: No interstitial edema or airspace consolidation. No pleural fluid. No pneumothorax. HEART AND MEDIASTINUM: Cardiac enlargement, unchanged  from previous exam. BONES AND SOFT TISSUES: No acute osseous abnormality. IMPRESSION: 1. No acute cardiopulmonary findings. Electronically signed by: Waddell Calk MD 03/02/2024 02:39 PM EST RP Workstation: [id number]        Scheduled Meds:  furosemide   80  mg Oral Daily   Continuous Infusions:  heparin  2,400 Units/hr (03/03/24 0132)          Sophie Mao, MD [facility] 03/03/2024, 7:19 AM

## 2024-03-04 ENCOUNTER — Inpatient Hospital Stay (HOSPITAL_COMMUNITY)

## 2024-03-04 DIAGNOSIS — I50812 Chronic right heart failure: Secondary | ICD-10-CM

## 2024-03-04 DIAGNOSIS — I2609 Other pulmonary embolism with acute cor pulmonale: Secondary | ICD-10-CM

## 2024-03-04 DIAGNOSIS — R519 Headache, unspecified: Secondary | ICD-10-CM

## 2024-03-04 DIAGNOSIS — R29818 Other symptoms and signs involving the nervous system: Secondary | ICD-10-CM | POA: Diagnosis not present

## 2024-03-04 LAB — BASIC METABOLIC PANEL WITH GFR
Anion gap: 11 (ref 5–15)
BUN: 14 mg/dL (ref 6–20)
CO2: 28 mmol/L (ref 22–32)
Calcium: 8.9 mg/dL (ref 8.9–10.3)
Chloride: 98 mmol/L (ref 98–111)
Creatinine, Ser: 0.94 mg/dL (ref 0.44–1.00)
GFR, Estimated: >60 mL/min (ref >60)
Glucose, Bld: 139 mg/dL — ABNORMAL HIGH (ref 70–99)
Potassium: 4.1 mmol/L (ref 3.5–5.1)
Sodium: 137 mmol/L (ref 135–145)

## 2024-03-04 LAB — CBC
HCT: 44.7 % (ref 36.0–46.0)
Hemoglobin: 13.7 g/dL (ref 12.0–15.0)
MCH: 29.3 pg (ref 26.0–34.0)
MCHC: 30.6 g/dL (ref 30.0–36.0)
MCV: 95.5 fL (ref 80.0–100.0)
Platelets: 238 K/uL (ref 150–400)
RBC: 4.68 MIL/uL (ref 3.87–5.11)
RDW: 14.4 % (ref 11.5–15.5)
WBC: 9.1 K/uL (ref 4.0–10.5)
nRBC: 0 % (ref 0.0–0.2)

## 2024-03-04 LAB — ECHOCARDIOGRAM LIMITED
Area-P 1/2: 4.36 cm2
Height: 66 in
Weight: 6589.11 [oz_av]

## 2024-03-04 LAB — MAGNESIUM: Magnesium: 2.1 mg/dL (ref 1.7–2.4)

## 2024-03-04 LAB — HEPARIN LEVEL (UNFRACTIONATED): Heparin Unfractionated: 0.56 [IU]/mL (ref 0.30–0.70)

## 2024-03-04 MED ORDER — PROCHLORPERAZINE EDISYLATE 10 MG/2ML IJ SOLN
5.0000 mg | Freq: Once | INTRAMUSCULAR | Status: AC
  Administered 2024-03-04: 5 mg via INTRAVENOUS
  Filled 2024-03-04: qty 2

## 2024-03-04 MED ORDER — ALBUTEROL SULFATE (2.5 MG/3ML) 0.083% IN NEBU: 3.0000 mL | INHALATION_SOLUTION | Freq: Four times a day (QID) | RESPIRATORY_TRACT | Status: DC | PRN

## 2024-03-04 MED ORDER — KETOROLAC TROMETHAMINE 30 MG/ML IJ SOLN
30.0000 mg | Freq: Once | INTRAMUSCULAR | Status: AC
  Administered 2024-03-04: 30 mg via INTRAVENOUS
  Filled 2024-03-04: qty 1

## 2024-03-04 MED ORDER — METHOCARBAMOL 500 MG PO TABS
500.0000 mg | ORAL_TABLET | Freq: Four times a day (QID) | ORAL | Status: DC | PRN
  Administered 2024-03-07: 500 mg via ORAL
  Filled 2024-03-04 (×2): qty 1

## 2024-03-04 MED ORDER — POTASSIUM CHLORIDE 20 MEQ PO PACK
20.0000 meq | PACK | Freq: Every day | ORAL | Status: DC
  Filled 2024-03-04: qty 1

## 2024-03-04 NOTE — Progress Notes (Signed)
 OT Cancellation Note  Patient Details Name: [name] MRN: [id number] DOB: 04-05-1986   Cancelled Treatment:    Reason Eval/Treat Not Completed: Medical issues which prohibited therapy Patient not medically ready OT orders received, chart reviewed. Pt noted to have new bilateral PE's. Per therapy protocol, and after PT confirming with MD, will hold OT evaluation until 48 hours after initiating anticoagulation (will be after 1547 on 03/04/24).  Ronal Gift E. Leather Estis, OTR/L Acute Rehabilitation Services [phone]     Ronal Gift Salt 03/04/2024, 12:12 PM

## 2024-03-04 NOTE — Progress Notes (Addendum)
 PT Cancellation Note  Patient Details Name: AMOREE NEWLON MRN: [id number] DOB: 1986-03-28   Cancelled Treatment:    Reason Eval/Treat Not Completed: Patient not medically ready PT orders received, chart reviewed. Pt noted to have new bilateral PE's. Per therapy protocol, and after confirming with MD, will hold PT evaluation until 48 hours after initiating anticoagulation (will be after 1547 on 03/04/24).  Richerd Pinal, PT, DPT 03/04/24, 11:50 AM   Richerd CHRISTELLA Pinal 03/04/2024, 11:50 AM

## 2024-03-04 NOTE — Progress Notes (Signed)
 PHARMACY - ANTICOAGULATION CONSULT NOTE  Pharmacy Consult for Heparin  Indication: pulmonary embolus  Allergies  Allergen Reactions   Tramadol  Other (See Comments)    Made the patient feel not like herself    Patient Measurements: Height: 5' 6 (167.6 cm) Weight: (!) 186.8 kg (411 lb 13.1 oz) IBW/kg (Calculated) : 59.3 HEPARIN  DW (KG): 107.9  Vital Signs: Temp: 98.4 F (36.9 C) (11/19 0601) Temp Source: Oral (11/19 0601) BP: 101/76 (11/19 0601) Pulse Rate: 102 (11/19 0601)  Labs: Recent Labs    03/02/24 1330 03/02/24 1545 03/02/24 2209 03/03/24 0539 03/03/24 1423 03/04/24 0529  HGB 14.5  --   --  13.7  --  13.7  HCT 45.9  --   --  45.1  --  44.7  PLT 205  --   --  207  --  238  APTT  --  29  --   --   --   --   LABPROT  --  14.2  --   --   --   --   INR  --  1.0  --   --   --   --   HEPARINUNFRC  --  <0.10*   < > 0.60 0.45 0.56  CREATININE 1.09*  --   --  0.89  --  0.94   < > = values in this interval not displayed.    Estimated Creatinine Clearance: 141.3 mL/min (by C-G formula based on SCr of 0.94 mg/dL).   Medical History: Past Medical History:  Diagnosis Date   Ankle ulcer (HCC)    right   Birth control counseling 01/25/2014   Childhood asthma    Chronic lower back pain    Encounter for counseling regarding contraception 05/03/2023   GERD (gastroesophageal reflux disease)    Headache    only when I'm hungry (10/14/2015)   History of COVID-19 12/22/2019   History of iritis 10/08/2013   Hypertension    Obesity    Pap smear for cervical cancer screening 01/06/2018   Varicose veins    VSD (ventricular septal defect)    had hole in bottom of her heart; closed up on it's own; no OR /mom 10/14/2015)    Assessment:  AC/Heme: PE with right heart strain - started IV UFH. - CBC WNL, Heparin  level 0.56 in goal.  Goal of Therapy:  Heparin  level 0.3-0.7 units/ml Monitor platelets by anticoagulation protocol: Yes   Plan:  Con't IV heparin  at 2400  units/hr Daily HL and CBC   [name], PharmD, BCPS Clinical Staff Pharmacist [name] 03/04/2024,7:27 AM

## 2024-03-04 NOTE — Progress Notes (Signed)
 PROGRESS NOTE    [name]  FMW:[id number] DOB: May 11, 1985 DOA: 03/02/2024 PCP: Alba Sharper, MD    Chief Complaint  Patient presents with   Shortness of Breath    Brief Narrative:  38 year old female with history of morbid obesity and chronic diastolic heart failure presented with worsening shortness of breath. On presentation, CTA chest showed bilateral PEs with right heart strain. She was started on heparin  drip. Pulmonary was consulted.    Assessment & Plan:   Principal Problem:   Acute pulmonary embolism with acute cor pulmonale (HCC) Active Problems:   Morbid obesity with BMI of 60.0-69.9, adult (HCC)   Menorrhagia   Suspected sleep apnea   Chronic diastolic CHF (congestive heart failure) (HCC)   Chronic right heart failure (HCC)   Acute nonintractable headache  #1 bilateral segmental and subsegmental PE/submassive PE with right ventricular strain -Likely secondary to recent initiation of OCP as symptoms coincided with initiation of hormone pills for menstrual cycle regulation. - Patient also noted to have had a recent 6-hour car trip to [place]  and back while on hormone medication. - Patient with no recent surgeries or long car rides and no recent prolonged immobility. -CT angiogram chest done with bilateral PEs and right heart strain noted - Lower extremity Dopplers negative for DVT however noted bilateral acute superficial vein thrombosis involving varicosities or other superficial veins. - 2D echo pending. - Patient with soft blood pressure however systolic blood pressure consistently above 100. -Continue IV heparin  drip. - Patient seen in consultation by pulmonary/PCCM who feels no acute intervention needed at this time.  2.  Chronic diastolic heart failure/chronic right heart failure with pulmonary hypertension -Strict I's and O's, daily weights. - Continue home regimen oral Lasix . - Outpatient follow-up with cardiology.  3.  Probable OSA -Being  followed in the outpatient setting with pulmonary and being set up for outpatient sleep study.  4.  Morbid obesity/obesity class III -Lifestyle modification. - Outpatient follow-up with PCP.  5.  Headache -Trial of Compazine  5 mg x 1, Toradol  30 mg IV x 1.   DVT prophylaxis: Heparin  GTT Code Status: Full Family Communication: Updated patient.  No family at bedside. Disposition: Home when clinically improved and cleared by pulmonary and on oral anticoagulation.  Status is: Inpatient Remains inpatient appropriate because: Severity of illness   Consultants:  PCCM: Dr. Theophilus 03/02/2024  Procedures:  CT angiogram chest 03/02/2024 Chest x-ray 03/02/2024 2D echo pending 03/04/2024 Lower extremity Dopplers 03/03/2024  Antimicrobials:  Anti-infectives (From admission, onward)    None         Subjective: Patient sitting up in bed on the telephone with her mother.  Denies any chest pain.  Still with some shortness of breath that she states is slowly improving.  Denies any overt bleeding.  Complains of headache.  Complaining of cough of clear sputum.  Objective: Vitals:   03/03/24 1240 03/03/24 1955 03/04/24 0601 03/04/24 1349  BP: 103/79 104/73 101/76 101/68  Pulse: 91 89 (!) 102 94  Resp:  17 20 20   Temp: 97.6 F (36.4 C) 98.6 F (37 C) 98.4 F (36.9 C) 98.6 F (37 C)  TempSrc: Oral Oral Oral Oral  SpO2: 93% 100% 97% 91%  Weight:      Height:        Intake/Output Summary (Last 24 hours) at 03/04/2024 1917 Last data filed at 03/04/2024 1610 Gross per 24 hour  Intake 958.39 ml  Output --  Net 958.39 ml   [employer]  03/02/24 1500 03/02/24 1800  Weight: (!) 186.8 kg (!) 186.8 kg    Examination:  General exam: Appears calm and comfortable  Respiratory system: Clear to auscultation. Respiratory effort normal. Cardiovascular system: S1 & S2 heard, RRR. No JVD, murmurs, rubs, gallops or clicks.  1-2+ bilateral lower extremity edema.   Gastrointestinal  system: Abdomen is nondistended, soft and nontender. No organomegaly or masses felt. Normal bowel sounds heard. Central nervous system: Alert and oriented. No focal neurological deficits. Extremities: Symmetric 5 x 5 power. Skin: No rashes, lesions or ulcers Psychiatry: Judgement and insight appear normal. Mood & affect appropriate.     Data Reviewed: I have personally reviewed following labs and imaging studies  CBC: Recent Labs  Lab 03/02/24 1330 03/03/24 0539 03/04/24 0529  WBC 8.4 8.6 9.1  HGB 14.5 13.7 13.7  HCT 45.9 45.1 44.7  MCV 92.7 94.9 95.5  PLT 205 207 238    Basic Metabolic Panel: Recent Labs  Lab 03/02/24 1330 03/03/24 0539 03/04/24 0529  NA 138 139 137  K 3.8 3.5 4.1  CL 97* 98 98  CO2 31 29 28   GLUCOSE 154* 143* 139*  BUN 13 16 14   CREATININE 1.09* 0.89 0.94  CALCIUM 9.2 9.1 8.9  MG  --  2.3 2.1    GFR: Estimated Creatinine Clearance: 141.3 mL/min (by C-G formula based on SCr of 0.94 mg/dL).  Liver Function Tests: No results for input(s): AST, ALT, ALKPHOS, BILITOT, PROT, ALBUMIN in the last 168 hours.  CBG: No results for input(s): GLUCAP in the last 168 hours.   No results found for this or any previous visit (from the past 240 hours).       Radiology Studies: VAS US  LOWER EXTREMITY VENOUS (DVT) (ONLY [facility] & [facility]) Result Date: 03/04/2024  Lower Venous DVT Study Patient Name:  [name] LOOMAN  Date of Exam:   03/03/2024 Medical Rec #: [id number]        Accession #:    [id number] Date of Birth: November 03, 1985        Patient Gender: F Patient Age:   78 years Exam Location:  [facility] Procedure:      VAS US  LOWER EXTREMITY VENOUS (DVT) Referring Phys: RILEY RANSOM --------------------------------------------------------------------------------  Indications: Pain, pulmonary embolism, and SOB.  Limitations: Body habitus and open wound. Comparison Study: No prior exam. Performing Technologist: Edilia Elden Appl  Examination  Guidelines: A complete evaluation includes B-mode imaging, spectral Doppler, color Doppler, and power Doppler as needed of all accessible portions of each vessel. Bilateral testing is considered an integral part of a complete examination. Limited examinations for reoccurring indications may be performed as noted. The reflux portion of the exam is performed with the patient in reverse Trendelenburg.  +---------+---------------+---------+-----------+----------+-------------------+ RIGHT    CompressibilityPhasicitySpontaneityPropertiesThrombus Aging      +---------+---------------+---------+-----------+----------+-------------------+ CFV      Full           Yes      Yes                                      +---------+---------------+---------+-----------+----------+-------------------+ SFJ      Full           Yes      Yes                                      +---------+---------------+---------+-----------+----------+-------------------+  FV Prox  Full                                                             +---------+---------------+---------+-----------+----------+-------------------+ FV Mid   Full                                                             +---------+---------------+---------+-----------+----------+-------------------+ FV DistalFull                                                             +---------+---------------+---------+-----------+----------+-------------------+ PFV      Full                                                             +---------+---------------+---------+-----------+----------+-------------------+ POP      Full           Yes      Yes                                      +---------+---------------+---------+-----------+----------+-------------------+ PTV      Full                                                             +---------+---------------+---------+-----------+----------+-------------------+ PERO                                                   Not well visualized +---------+---------------+---------+-----------+----------+-------------------+ VV       None           No       No                                       +---------+---------------+---------+-----------+----------+-------------------+ Extensive thrombus noted in the varicosities of the right lower extremity from the distal thigh to distal calf. Superficial thrombophlebitis vs sclerotherapy.  +---------+---------------+---------+-----------+----------+--------------+ LEFT     CompressibilityPhasicitySpontaneityPropertiesThrombus Aging +---------+---------------+---------+-----------+----------+--------------+ CFV      Full           Yes      Yes                                 +---------+---------------+---------+-----------+----------+--------------+  SFJ      Full           Yes      Yes                                 +---------+---------------+---------+-----------+----------+--------------+ FV Prox  Full                                                        +---------+---------------+---------+-----------+----------+--------------+ FV Mid   Full                                                        +---------+---------------+---------+-----------+----------+--------------+ FV DistalFull                                                        +---------+---------------+---------+-----------+----------+--------------+ PFV      Full                                                        +---------+---------------+---------+-----------+----------+--------------+ POP      Full           Yes      Yes                                 +---------+---------------+---------+-----------+----------+--------------+ VV       None           No       No                                  +---------+---------------+---------+-----------+----------+--------------+ Extensive thrombus noted in  the varicosities of the left lower extremity from the saphenofemoral junction area to the distal thigh. Superficial thrombophlebitis vs sclerotherapy. Patient declined having her left calf examined due to an open wound.    Summary: RIGHT: - Findings consistent with acute superficial vein thrombosis involving the right varicosities or other superficial veins.  - No cystic structure found in the popliteal fossa.  LEFT: - Findings consistent with acute superficial vein thrombosis involving the left varicosities or other superficial veins.  - No cystic structure found in the popliteal fossa.  *See table(s) above for measurements and observations. Electronically signed by Debby Robertson on 03/04/2024 at 8:13:54 AM.    Final         Scheduled Meds:  furosemide   80 mg Oral Daily   potassium chloride   20 mEq Oral Daily   Continuous Infusions:  heparin  2,400 Units/hr (03/04/24 1610)     LOS: 2 days    Time spent: 40 minutes    Toribio Hummer, MD [facility]   To contact the attending provider between  7A-7P or the covering provider during after hours 7P-7A, please log into the web site [email or web address] and access using universal [place] password for that web site. If you do not have the password, please call the hospital operator.  03/04/2024, 7:17 PM

## 2024-03-04 NOTE — Progress Notes (Signed)
  Echocardiogram 2D Echocardiogram has been performed.  [name], RDCS 03/04/2024, 10:03 AM

## 2024-03-05 ENCOUNTER — Telehealth (HOSPITAL_COMMUNITY): Payer: Self-pay | Admitting: Pharmacy Technician

## 2024-03-05 ENCOUNTER — Other Ambulatory Visit (HOSPITAL_COMMUNITY): Payer: Self-pay

## 2024-03-05 DIAGNOSIS — R519 Headache, unspecified: Secondary | ICD-10-CM | POA: Diagnosis not present

## 2024-03-05 DIAGNOSIS — I5032 Chronic diastolic (congestive) heart failure: Secondary | ICD-10-CM | POA: Diagnosis not present

## 2024-03-05 DIAGNOSIS — G4734 Idiopathic sleep related nonobstructive alveolar hypoventilation: Secondary | ICD-10-CM

## 2024-03-05 DIAGNOSIS — I2694 Multiple subsegmental pulmonary emboli without acute cor pulmonale: Secondary | ICD-10-CM

## 2024-03-05 DIAGNOSIS — I2609 Other pulmonary embolism with acute cor pulmonale: Secondary | ICD-10-CM | POA: Diagnosis not present

## 2024-03-05 DIAGNOSIS — I50812 Chronic right heart failure: Secondary | ICD-10-CM | POA: Diagnosis not present

## 2024-03-05 LAB — CBC
HCT: 45.4 % (ref 36.0–46.0)
Hemoglobin: 13.7 g/dL (ref 12.0–15.0)
MCH: 28.8 pg (ref 26.0–34.0)
MCHC: 30.2 g/dL (ref 30.0–36.0)
MCV: 95.4 fL (ref 80.0–100.0)
Platelets: 253 K/uL (ref 150–400)
RBC: 4.76 MIL/uL (ref 3.87–5.11)
RDW: 14.3 % (ref 11.5–15.5)
WBC: 7.9 K/uL (ref 4.0–10.5)
nRBC: 0 % (ref 0.0–0.2)

## 2024-03-05 LAB — BASIC METABOLIC PANEL WITH GFR
Anion gap: 11 (ref 5–15)
BUN: 20 mg/dL (ref 6–20)
CO2: 26 mmol/L (ref 22–32)
Calcium: 9.6 mg/dL (ref 8.9–10.3)
Chloride: 100 mmol/L (ref 98–111)
Creatinine, Ser: 1 mg/dL (ref 0.44–1.00)
GFR, Estimated: >60 mL/min (ref >60)
Glucose, Bld: 134 mg/dL — ABNORMAL HIGH (ref 70–99)
Potassium: 4 mmol/L (ref 3.5–5.1)
Sodium: 137 mmol/L (ref 135–145)

## 2024-03-05 LAB — HEPARIN LEVEL (UNFRACTIONATED): Heparin Unfractionated: 0.53 [IU]/mL (ref 0.30–0.70)

## 2024-03-05 LAB — ALBUMIN: Albumin: 3.6 g/dL (ref 3.5–5.0)

## 2024-03-05 MED ORDER — PROCHLORPERAZINE EDISYLATE 10 MG/2ML IJ SOLN
5.0000 mg | Freq: Four times a day (QID) | INTRAMUSCULAR | Status: DC | PRN
  Administered 2024-03-05: 5 mg via INTRAVENOUS
  Filled 2024-03-05: qty 2

## 2024-03-05 MED ORDER — APIXABAN 5 MG PO TABS
10.0000 mg | ORAL_TABLET | Freq: Two times a day (BID) | ORAL | Status: DC
  Administered 2024-03-05 – 2024-03-08 (×7): 10 mg via ORAL
  Filled 2024-03-05 (×8): qty 2

## 2024-03-05 MED ORDER — KETOROLAC TROMETHAMINE 30 MG/ML IJ SOLN: 30.0000 mg | Freq: Four times a day (QID) | INTRAMUSCULAR | Status: DC | PRN

## 2024-03-05 MED ORDER — KETOROLAC TROMETHAMINE 30 MG/ML IJ SOLN
30.0000 mg | Freq: Four times a day (QID) | INTRAMUSCULAR | Status: DC | PRN
  Administered 2024-03-05: 30 mg via INTRAVENOUS
  Filled 2024-03-05: qty 1

## 2024-03-05 MED ORDER — APIXABAN 5 MG PO TABS: 5.0000 mg | ORAL_TABLET | Freq: Two times a day (BID) | ORAL | Status: DC

## 2024-03-05 MED ORDER — POTASSIUM CHLORIDE CRYS ER 20 MEQ PO TBCR
20.0000 meq | EXTENDED_RELEASE_TABLET | Freq: Two times a day (BID) | ORAL | Status: DC
  Administered 2024-03-05 – 2024-03-06 (×3): 20 meq via ORAL
  Filled 2024-03-05 (×4): qty 1

## 2024-03-05 NOTE — Progress Notes (Signed)
 SATURATION QUALIFICATIONS: (This note is used to comply with regulatory documentation for home oxygen)  Patient Saturations on Room Air at Rest = 86%  Patient Saturations on Room Air while Ambulating = --%  Patient Saturations on 4 Liters of oxygen while Ambulating = 93%  Please briefly explain why patient needs home oxygen: to maintain appropriate levels of SpO2.   Sylvan Nest Kistler PT 03/05/2024  Acute Rehabilitation Services  Office [phone]

## 2024-03-05 NOTE — Discharge Instructions (Addendum)
 Information on my medicine - ELIQUIS (apixaban)  Why was Eliquis prescribed for you? Eliquis was prescribed to treat blood clots that may have been found in the veins of your legs (deep vein thrombosis) or in your lungs (pulmonary embolism) and to reduce the risk of them occurring again.  What do You need to know about Eliquis ? The starting dose is 10 mg (two 5 mg tablets) taken TWICE daily for the FIRST SEVEN (7) DAYS, then on 11/27  the dose is reduced to ONE 5 mg tablet taken TWICE daily.  Eliquis may be taken with or without food.   Try to take the dose about the same time in the morning and in the evening. If you have difficulty swallowing the tablet whole please discuss with your pharmacist how to take the medication safely.  Take Eliquis exactly as prescribed and DO NOT stop taking Eliquis without talking to the doctor who prescribed the medication.  Stopping may increase your risk of developing a new blood clot.  Refill your prescription before you run out.  After discharge, you should have regular check-up appointments with your healthcare provider that is prescribing your Eliquis.    What do you do if you miss a dose? If a dose of ELIQUIS is not taken at the scheduled time, take it as soon as possible on the same day and twice-daily administration should be resumed. The dose should not be doubled to make up for a missed dose.  Important Safety Information A possible side effect of Eliquis is bleeding. You should call your healthcare provider right away if you experience any of the following: Bleeding from an injury or your nose that does not stop. Unusual colored urine (red or dark brown) or unusual colored stools (red or black). Unusual bruising for unknown reasons. A serious fall or if you hit your head (even if there is no bleeding).  Some medicines may interact with Eliquis and might increase your risk of bleeding or clotting while on Eliquis. To help avoid this,  consult your healthcare provider or pharmacist prior to using any new prescription or non-prescription medications, including herbals, vitamins, non-steroidal anti-inflammatory drugs (NSAIDs) and supplements.  This website has more information on Eliquis (apixaban): http://www.eliquis.com/eliquis/home

## 2024-03-05 NOTE — Consult Note (Deleted)
 NAME:  [name], MRN:  [id number], DOB:  December 12, 1985, LOS: 3 ADMISSION DATE:  03/02/2024, CONSULTATION DATE: 03/02/2024 REFERRING MD: Curtistine Dawn, MD, CHIEF COMPLAINT:  Pulmonary embolism   History of Present Illness:   The patient, with diastolic heart failure, RV failure and obesity,, presents with shortness of breath and a new diagnosis of pulmonary embolism.  Dyspnea - Shortness of breath present for one week - Initial suspicion for fluid retention related to diuretic use - Progressive worsening of symptoms - Current use of albuterol  inhaler as needed for shortness of breath  Pulmonary embolism - Noted on CTA in the emergency room - Onset of symptoms coincided with initiation of hormone pills (likely birth control) last month for menstrual cycle regulation - Recent six-hour car trip to [place]  and back while on hormone medication - No recent surgeries or prolonged immobility  Cardiac dysfunction - Diastolic heart failure and right-sided heart failure managed by Dr. Anner, cardiologist - History of pulmonary hypertension  Sleep-related breathing disorder - Potential sleep apnea under evaluation - Outpatient sleep consult scheduled for December  Asthma - History of childhood asthma but is well-controlled now - Current use of albuterol  inhaler as needed  Thromboembolic risk factors - Family history of blood clots on maternal side - Aunt with history of pulmonary embolism  Pertinent  Medical History    has a past medical history of Ankle ulcer (HCC), Birth control counseling (01/25/2014), Childhood asthma, Chronic lower back pain, Encounter for counseling regarding contraception (05/03/2023), GERD (gastroesophageal reflux disease), Headache, History of COVID-19 (12/22/2019), History of iritis (10/08/2013), Hypertension, Obesity, Pap smear for cervical cancer screening (01/06/2018), Varicose veins, and VSD (ventricular septal defect).   Significant Hospital  Events: Including procedures, antibiotic start and stop dates in addition to other pertinent events     Interim History / Subjective:    Objective    Blood pressure 107/89, pulse (!) 108, temperature 99.9 F (37.7 C), temperature source Oral, resp. rate 17, height 5' 6 (1.676 m), weight (!) 186.8 kg, last menstrual period 02/14/2024, SpO2 91%.        Intake/Output Summary (Last 24 hours) at 03/05/2024 0945 Last data filed at 03/05/2024 0300 Gross per 24 hour  Intake 689.27 ml  Output --  Net 689.27 ml   Filed Weights   03/02/24 1500 03/02/24 1800  Weight: (!) 186.8 kg (!) 186.8 kg    Examination: Obese female in no distress Lungs are clear to auscultation, heart rate regular rate and rhythm  Echo with severe reduction in RV systolic function, severely elevated PA systolic pressure Lower extremity duplex with superficial clot   Resolved problem list   Assessment and Plan  Acute bilateral segmental and subsegmental pulmonary embolism Likely related to recent initiation of hormone pills, long car ride, and predisposing factors such as obesity and suspected sleep apnea. Clots are peripheral and not large central clots, thus not amenable to interventional radiology. Current treatment with heparin  is appropriate to prevent new clot formation and aid in dissolving existing clots. - Continue heparin  drip.  Can change to DOAC - Advised discontinuation of hormone pills  Chronic right heart failure and diastolic heart failure with pulmonary hypertension Obstructive sleep apnea/OHS, suspected Likely exacerbated by sleep apnea and obesity. Right heart strain noted on CT scan, not solely due to recent pulmonary embolism. No need for aggressive interventional treatment at this time. - Continue follow-up with cardiologist.  May need right heart cath once evaluated and optimized for sleep apnea. - Has  appointment  with Dr. Neda, sleep specialist in December  PCCM will sign off.  Please call with questions.  Signature:   Mariellen Blaney MD [facility] See Amion for pager  If no response to pager , please call [phone] until 7pm After 7:00 pm call Elink  [phone] 03/05/2024, 9:45 AM

## 2024-03-05 NOTE — Evaluation (Signed)
 Occupational Therapy Evaluation Patient Details Name: [name] MRN: [id number] DOB: 1985/07/25 Today's Date: 03/05/2024   History of Present Illness   [name] is a 38 yo female presented with worsening shortness of breath. CTA chest showed bilateral PEs with right heart strain. She was started on heparin  drip PMH: morbid obesity and chronic diastolic heart failure     Clinical Impressions PTA, patient lives at home alone and is indep with amb, B/IADL's including a FF of steps to 2nd floor bed and bath but has a friend to assist for higher level activity if needed. Patient works as CIVIL ENGINEER, CONTRACTING at [employer], showers at her mom's home with a walk in shower. Currently, patient presents with deficits outlined below (see OT Problem List for details) most significantly decreased activity tolerance now on supplemental O2, increased body habitus with LE edema and wears compression socks, mild balance deficits for BADL's and functional mobility performance. Recommending HHOT services upon hospital discharge. Patient requires continued Acute care hospital level OT services to progress safety and functional performance and allow for discharge.       If plan is discharge home, recommend the following:   Assist for transportation     Functional Status Assessment   Patient has had a recent decline in their functional status and demonstrates the ability to make significant improvements in function in a reasonable and predictable amount of time.     Equipment Recommendations   None recommended by OT      Precautions/Restrictions   Precautions Precautions: Other (comment) Recall of Precautions/Restrictions: Intact Precaution/Restrictions Comments: monitor O2 Restrictions Weight Bearing Restrictions Per Provider Order: No     Mobility Bed Mobility Overal bed mobility: Modified Independent             General bed mobility comments: was at EOB with no issues reported in and out  all day    Transfers Overall transfer level: Independent Equipment used: None               General transfer comment: self powering up with momentum      Balance Overall balance assessment: Mild deficits observed, not formally tested                                         ADL either performed or assessed with clinical judgement   ADL Overall ADL's : Modified independent                                       General ADL Comments: increased body habitus and wears compression hose (knee hi) but is able with increased time and side sit strategies to don     Vision Baseline Vision/History: 0 No visual deficits              Pertinent Vitals/Pain Pain Assessment Pain Assessment: Faces Faces Pain Scale: No hurt     Extremity/Trunk Assessment Upper Extremity Assessment Upper Extremity Assessment: Right hand dominant;Overall [facility] for tasks assessed   Lower Extremity Assessment Lower Extremity Assessment: Overall WFL for tasks assessed   Cervical / Trunk Assessment Cervical / Trunk Assessment: Normal   Communication Communication Communication: No apparent difficulties   Cognition Arousal: Alert Behavior During Therapy: WFL for tasks assessed/performed Cognition: No apparent impairments  Following commands: Intact       Cueing  General Comments   Cueing Techniques: Verbal cues  B LE edema +2, increased body habitus with some DOE; SpO2 remained 98% on 2 ltrs of O2, removed for toileting and SpO2 on RA 88% with recover to 92% with brief rest and cues, replaced O2 and sats back to 99% on 2 ltrs via [place]           Home Living Family/patient expects to be discharged to:: Private residence Living Arrangements: Non-relatives/Friends Available Help at Discharge: Friend(s) Type of Home: Apartment Home Access: Stairs to enter Secretary/administrator of Steps: 1   Home Layout: Two  level Alternate Level Stairs-Number of Steps: 13 Alternate Level Stairs-Rails: Right Bathroom Shower/Tub: Chief Strategy Officer: Standard     Home Equipment: None          Prior Functioning/Environment Prior Level of Function : Independent/Modified Independent;Driving             Mobility Comments: walks without AD, no falls in 6 months; works at [employer] ADLs Comments: independent    OT Problem List: Decreased activity tolerance;Impaired balance (sitting and/or standing);Decreased safety awareness;Cardiopulmonary status limiting activity;Obesity   OT Treatment/Interventions: Self-care/ADL training;Therapeutic exercise;Energy conservation;DME and/or AE instruction;Therapeutic activities;Patient/family education;Balance training      OT Goals(Current goals can be found in the care plan section)   Acute Rehab OT Goals Patient Stated Goal: to get off the oxygen if i can OT Goal Formulation: With patient Time For Goal Achievement: 03/19/24 Potential to Achieve Goals: Good ADL Goals Pt Will Perform Lower Body Dressing: with modified independence;with adaptive equipment;sitting/lateral leans;sit to/from stand Pt Will Perform Tub/Shower Transfer: Tub transfer;shower seat;ambulating Pt/caregiver will Perform Home Exercise Program: Increased strength;With theraband;Independently;With written HEP provided Additional ADL Goal #1: Patient will teach back 4/5 ECT strategies with min cues for B/IADL's including breathing   OT Frequency:  Min 2X/week       AM-PAC OT 6 Clicks Daily Activity     Outcome Measure Help from another person eating meals?: None Help from another person taking care of personal grooming?: None Help from another person toileting, which includes using toliet, bedpan, or urinal?: None Help from another person bathing (including washing, rinsing, drying)?: None Help from another person to put on and taking off regular upper body clothing?:  None Help from another person to put on and taking off regular lower body clothing?: None 6 Click Score: 24   End of Session Equipment Utilized During Treatment: Oxygen;Gait belt Nurse Communication: Mobility status  Activity Tolerance: Patient tolerated treatment well Patient left: in bed (EOB)  OT Visit Diagnosis: Unsteadiness on feet (R26.81)                Time: 1425-1450 OT Time Calculation (min): 25 min Charges:  OT General Charges $OT Visit: 1 Visit OT Evaluation $OT Eval Low Complexity: 1 Low  [facility] OT/L Acute Rehabilitation Department  [phone]  03/05/2024, 4:15 PM

## 2024-03-05 NOTE — Progress Notes (Addendum)
 PROGRESS NOTE    [name]  FMW:[id number] DOB: [birth year] DOA: 03/02/2024 PCP: Alba Sharper, MD    Chief Complaint  Patient presents with   Shortness of Breath    Brief Narrative:  38 year old female with history of morbid obesity and chronic diastolic heart failure presented with worsening shortness of breath. On presentation, CTA chest showed bilateral PEs with right heart strain. She was started on heparin  drip. Pulmonary was consulted.    Assessment & Plan:   Principal Problem:   Acute pulmonary embolism with acute cor pulmonale (HCC) Active Problems:   Morbid obesity with BMI of 60.0-69.9, adult (HCC)   Menorrhagia   Suspected sleep apnea   Chronic diastolic CHF (congestive heart failure) (HCC)   Chronic right heart failure (HCC)   Acute nonintractable headache  #1 bilateral segmental and subsegmental PE/submassive PE with right ventricular strain -Likely secondary to recent initiation of OCP as symptoms coincided with initiation of hormone pills for menstrual cycle regulation. - Patient also noted to have had a recent 6-hour car trip to [place]  and back while on hormone medication. - Patient with no recent surgeries and no recent prolonged immobility. -CT angiogram chest done with bilateral PEs and right heart strain noted - Lower extremity Dopplers negative for DVT however noted bilateral acute superficial vein thrombosis involving varicosities or other superficial veins. - 2D echo with a EF of 60 to 65%, NWMA, interventricular septum flattened in systole and diastole consistent with right ventricular pressure and volume overload.  Severely reduced right ventricular systolic function.  Severely enlarged right ventricular size.  Severely elevated pulmonary artery systolic pressure with estimated right ventricular systolic pressure of 83.9 mmHg.  Severely dilated right atrial size.  Trivial MVR.  Mild to moderate TVR.   - Patient with soft blood pressure however  systolic blood pressure consistently above 100. - Currently on IV heparin  drip and will transition to DOAC as recommended by pulmonary today.  -Patient advised on discontinuation of hormone pills. - Patient seen in consultation by pulmonary/PCCM who feels no acute intervention needed at this time. - Outpatient follow-up with pulmonary, patient already has an appointment with Dr. Neda, sleep specialist in December. - Likely need home O2 on discharge.  2.  Chronic diastolic heart failure/chronic right heart failure with pulmonary hypertension -Strict I's and O's, daily weights. - Continue home regimen oral Lasix . - Outpatient follow-up with cardiology.  3.  Probable OSA -Being followed in the outpatient setting with pulmonary and being set up for outpatient sleep study. - Patient already has outpatient appointment with pulmonary, Dr. Neda, sleep specialist in December  4.  Morbid obesity/obesity class III -Lifestyle modification. - Outpatient follow-up with PCP.  5.  Headache - Patient given Compazine and Toradol  yesterday with improvement with headache.   - Place on headache cocktail of Toradol  and Compazine.    DVT prophylaxis: Heparin  GTT>>> Eliquis Code Status: Full Family Communication: Updated patient.  Updated mother on speaker phone.  Disposition: Home when clinically improved and cleared by pulmonary and on oral anticoagulation in the next 24 to 48 hours.  Status is: Inpatient Remains inpatient appropriate because: Severity of illness   Consultants:  PCCM: Dr. Theophilus 03/02/2024  Procedures:  CT angiogram chest 03/02/2024 Chest x-ray 03/02/2024 2D echo 03/04/2024 Lower extremity Dopplers 03/03/2024  Antimicrobials:  Anti-infectives (From admission, onward)    None         Subjective: Patient sitting up in chair on the phone with her mother.  Denies any chest pain.  Still with some shortness of breath but slowly improving.  Stated ambulated in hallway  and oxygen sats dropped into the 80s.  Currently on oxygen.  Denies any bleeding.    Objective: Vitals:   03/05/24 1034 03/05/24 1100 03/05/24 1130 03/05/24 1343  BP:    99/64  Pulse:  (!) 130 96 94  Resp:      Temp:    98.9 F (37.2 C)  TempSrc:    Oral  SpO2: (!) 86% 93%  (!) 87%  Weight:      Height:        Intake/Output Summary (Last 24 hours) at 03/05/2024 1758 Last data filed at 03/05/2024 0300 Gross per 24 hour  Intake 496.46 ml  Output --  Net 496.46 ml   Filed Weights   03/02/24 1500 03/02/24 1800  Weight: (!) 186.8 kg (!) 186.8 kg    Examination:  General exam: NAD. Respiratory system: CTAB.  No wheezes, no crackles, no rhonchi.  Fair air movement.  Speaking in full sentences.  Cardiovascular system: Regular rate rhythm no murmurs rubs or gallops.  No JVD.  1+ bilateral lower extremity edema.  Gastrointestinal system: Abdomen is soft, nontender, nondistended, positive bowel sounds.  No rebound.  No guarding.  Central nervous system: Alert and oriented. No focal neurological deficits. Extremities: Symmetric 5 x 5 power. Skin: No rashes, lesions or ulcers Psychiatry: Judgement and insight appear normal. Mood & affect appropriate.     Data Reviewed: I have personally reviewed following labs and imaging studies  CBC: Recent Labs  Lab 03/02/24 1330 03/03/24 0539 03/04/24 0529 03/05/24 0557  WBC 8.4 8.6 9.1 7.9  HGB 14.5 13.7 13.7 13.7  HCT 45.9 45.1 44.7 45.4  MCV 92.7 94.9 95.5 95.4  PLT 205 207 238 253    Basic Metabolic Panel: Recent Labs  Lab 03/02/24 1330 03/03/24 0539 03/04/24 0529 03/05/24 0557  NA 138 139 137 137  K 3.8 3.5 4.1 4.0  CL 97* 98 98 100  CO2 31 29 28 26   GLUCOSE 154* 143* 139* 134*  BUN 13 16 14 20   CREATININE 1.09* 0.89 0.94 1.00  CALCIUM 9.2 9.1 8.9 9.6  MG  --  2.3 2.1  --     GFR: Estimated Creatinine Clearance: 132.8 mL/min (by C-G formula based on SCr of 1 mg/dL).  Liver Function Tests: Recent Labs  Lab  03/05/24 0557  ALBUMIN 3.6    CBG: No results for input(s): GLUCAP in the last 168 hours.   No results found for this or any previous visit (from the past 240 hours).       Radiology Studies: ECHOCARDIOGRAM LIMITED Result Date: 03/04/2024    ECHOCARDIOGRAM LIMITED REPORT   Patient Name:   [name] Date of Exam: 03/04/2024 Medical Rec #:  [id number]       Height:       66.0 in Accession #:    [id number]      Weight:       411.8 lb Date of Birth:  04-25-1985       BSA:          2.718 m Patient Age:    38 years        BP:           101/76 mmHg Patient Gender: F               HR:           106 bpm. Exam Location:  Inpatient Procedure:  Limited Echo, Limited Color Doppler and Cardiac Doppler (Both            Spectral and Color Flow Doppler were utilized during procedure). Indications:    Pulmonary Embolus I26.09  History:        Patient has prior history of Echocardiogram examinations, most                 recent 02/05/2024. Pulmonary Hypertension,                 Signs/Symptoms:Shortness of Breath, Dyspnea and Edema; Risk                 Factors:Hypertension, Pre-diabetes and Sleep Apnea.  Sonographer:    Koleen Popper RDCS Referring Phys: [id number] [facility]  Sonographer Comments: Patient is obese. Image acquisition challenging due to patient body habitus. IMPRESSIONS  1. Left ventricular ejection fraction, by estimation, is 60 to 65%. The left ventricle has normal function. The left ventricle has no regional wall motion abnormalities. Left ventricular diastolic parameters were normal. There is the interventricular septum is flattened in systole and diastole, consistent with right ventricular pressure and volume overload.  2. Right ventricular systolic function is severely reduced. The right ventricular size is severely enlarged. There is severely elevated pulmonary artery systolic pressure. The estimated right ventricular systolic pressure is 83.9 mmHg.  3. Right atrial size was severely  dilated.  4. The mitral valve is normal in structure. Trivial mitral valve regurgitation.  5. Tricuspid valve regurgitation is mild to moderate.  6. The aortic valve is grossly normal. Aortic valve regurgitation is not visualized. No aortic stenosis is present.  7. Aortic dilatation noted. There is mild dilatation of the aortic root, measuring 43 mm.  8. The inferior vena cava is dilated in size with <50% respiratory variability, suggesting right atrial pressure of 15 mmHg. FINDINGS  Left Ventricle: Left ventricular ejection fraction, by estimation, is 60 to 65%. The left ventricle has normal function. The left ventricle has no regional wall motion abnormalities. The interventricular septum is flattened in systole and diastole, consistent with right ventricular pressure and volume overload. Left ventricular diastolic parameters were normal. Right Ventricle: The right ventricular size is severely enlarged. Right ventricular systolic function is severely reduced. There is severely elevated pulmonary artery systolic pressure. The tricuspid regurgitant velocity is 4.15 m/s, and with an assumed right atrial pressure of 15 mmHg, the estimated right ventricular systolic pressure is 83.9 mmHg. Left Atrium: Left atrial size was normal in size. Right Atrium: Right atrial size was severely dilated. Pericardium: There is no evidence of pericardial effusion. Mitral Valve: The mitral valve is normal in structure. Trivial mitral valve regurgitation. Tricuspid Valve: The tricuspid valve is normal in structure. Tricuspid valve regurgitation is mild to moderate. Aortic Valve: The aortic valve is grossly normal. Aortic valve regurgitation is not visualized. No aortic stenosis is present. Pulmonic Valve: The pulmonic valve was not well visualized. Aorta: Aortic dilatation noted. There is mild dilatation of the aortic root, measuring 43 mm. Venous: The inferior vena cava is dilated in size with less than 50% respiratory variability,  suggesting right atrial pressure of 15 mmHg. IAS/Shunts: There is left bowing of the interatrial septum, suggestive of elevated right atrial pressure. Additional Comments: Spectral Doppler performed. Color Doppler performed.  LEFT VENTRICLE PLAX 2D LVOT diam:     2.30 cm   Diastology LV SV:         64        LV e' medial:  4.57 cm/s LV SV Index:   24        LV E/e' medial:  17.7 LVOT Area:     4.15 cm  LV e' lateral:   8.05 cm/s                          LV E/e' lateral: 10.1  RIGHT VENTRICLE             IVC RV Basal diam:  6.40 cm     IVC diam: 3.20 cm RV Mid diam:    6.20 cm RV S prime:     12.10 cm/s TAPSE (M-mode): 1.6 cm LEFT ATRIUM           Index        RIGHT ATRIUM           Index LA Vol (A4C): 59.9 ml 22.04 ml/m  RA Area:     33.70 cm                                    RA Volume:   149.00 ml 54.81 ml/m  AORTIC VALVE LVOT Vmax:   96.80 cm/s LVOT Vmean:  64.700 cm/s LVOT VTI:    0.155 m  AORTA Ao Root diam: 4.30 cm Ao Asc diam:  3.60 cm MITRAL VALVE               TRICUSPID VALVE MV Area (PHT): 4.36 cm    TR Peak grad:   68.9 mmHg MV Decel Time: 174 msec    TR Vmax:        415.00 cm/s MV E velocity: 81.00 cm/s MV A velocity: 81.40 cm/s  SHUNTS MV E/A ratio:  1.00        Systemic VTI:  0.16 m                            Systemic Diam: 2.30 cm Toribio Fuel MD Electronically signed by Toribio Fuel MD Signature Date/Time: 03/04/2024/8:15:57 PM    Final         Scheduled Meds:  apixaban   10 mg Oral BID   Followed by   NOREEN ON 03/12/2024] apixaban   5 mg Oral BID   furosemide   80 mg Oral Daily   potassium chloride   20 mEq Oral BID   Continuous Infusions:     LOS: 3 days    Time spent: 40 minutes    Toribio Hummer, MD [facility]   To contact the attending provider between 7A-7P or the covering provider during after hours 7P-7A, please log into the web site [email or web address] and access using universal [place] password for that web site. If you do not have the  password, please call the hospital operator.  03/05/2024, 5:58 PM

## 2024-03-05 NOTE — Progress Notes (Addendum)
 PHARMACY - ANTICOAGULATION CONSULT NOTE  Pharmacy Consult for Heparin  Indication: pulmonary embolus  Allergies  Allergen Reactions   Tramadol  Other (See Comments)    Made the patient feel not like herself    Patient Measurements: Height: 5' 6 (167.6 cm) Weight: (!) 186.8 kg (411 lb 13.1 oz) IBW/kg (Calculated) : 59.3 HEPARIN  DW (KG): 107.9  Vital Signs: Temp: 99.9 F (37.7 C) (11/20 0603) Temp Source: Oral (11/20 0603) BP: 107/89 (11/20 0603) Pulse Rate: 108 (11/20 0603)  Labs: Recent Labs    03/02/24 1545 03/02/24 2209 03/03/24 0539 03/03/24 1423 03/04/24 0529 03/05/24 0557  HGB  --   --  13.7  --  13.7 13.7  HCT  --   --  45.1  --  44.7 45.4  PLT  --   --  207  --  238 253  APTT 29  --   --   --   --   --   LABPROT 14.2  --   --   --   --   --   INR 1.0  --   --   --   --   --   HEPARINUNFRC <0.10*   < > 0.60 0.45 0.56 0.53  CREATININE  --   --  0.89  --  0.94 1.00   < > = values in this interval not displayed.    Estimated Creatinine Clearance: 132.8 mL/min (by C-G formula based on SCr of 1 mg/dL).   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 Patient not on anticoagulation prior to admission but is on birth control medication. Pharmacy consulted for heparin   management.   Today, 03/03/24 Heparin  level = 0.53, remains therapeutic on heparin  gtt @2400  units/hr CBC WNL No infusion complications or signs of bleeding documented  Goal of Therapy:  Heparin  level 0.3-0.7 units/ml Monitor platelets by anticoagulation protocol: Yes   Plan:  Continue IV heparin  at 2400 units/hr Monitor heparin  level and CBC daily  Continue to monitor for s/sx of bleeding  Follow-up long-term anticoagulation plan  ADDENDUM: MD now requesting transition to Eliquis  in preparation for discharge. Eliquis  copay is $0. No s/sx of bleeding documented.  Plan: Stop heparin  now Start Eliquis  10mg  PO BID x7 days followed by 5mg  BID thereafter Continue to monitor for s/sx of bleeding   Thank you for allowing pharmacy to be a part of this patient's care.  Lacinda Moats, PharmD Clinical Pharmacist  11/20/20257:44 AM

## 2024-03-05 NOTE — Evaluation (Signed)
 Physical Therapy Evaluation Patient Details Name: [name] MRN: [id number] DOB: [birth year] Today's Date: 03/05/2024  History of Present Illness  [name] is a 38 yo female presented with worsening shortness of breath. CTA chest showed bilateral PEs with right heart strain. She was started on heparin  drip PMH: morbid obesity and chronic diastolic heart failure  Clinical Impression  Pt admitted with above diagnosis. Pt is independent with mobility at baseline. Today pt ambulated 100' without an assistive device, SpO2 93% on 4L O2 walking, HR 130. At rest SpO2 was 86% on room air. Instructed pt in pursed lip breathing and in BUE/LE strengthening exercises to minimize deconditioning during hospitalization. At present she qualifies for home O2.  Pt currently with functional limitations due to the deficits listed below (see PT Problem List). Pt will benefit from acute skilled PT to increase their independence and safety with mobility to allow discharge.           If plan is discharge home, recommend the following: Help with stairs or ramp for entrance   Can travel by private vehicle        Equipment Recommendations None recommended by PT  Recommendations for Other Services       Functional Status Assessment Patient has had a recent decline in their functional status and demonstrates the ability to make significant improvements in function in a reasonable and predictable amount of time.     Precautions / Restrictions Precautions Precautions: Other (comment) Recall of Precautions/Restrictions: Intact Precaution/Restrictions Comments: monitor O2 Restrictions Weight Bearing Restrictions Per Provider Order: No      Mobility  Bed Mobility Overal bed mobility: Modified Independent             General bed mobility comments: HOB up, used rail    Transfers Overall transfer level: Independent Equipment used: None               General transfer comment: used  momentum    Ambulation/Gait Ambulation/Gait assistance: Chief Operating Officer (Feet): 100 Feet Assistive device: None Gait Pattern/deviations: WFL(Within Functional Limits) Gait velocity: decr     General Gait Details: SpO2 86% room air rest, 87% 2L walking, 93% 4L walking, HR 130 walking (MD and RN made aware of walking vitals); VCs for pursed lip breathing, distance limited by 2/4 dyspena. trunk sway to L and R 2* body habitus  Stairs            Wheelchair Mobility     Tilt Bed    Modified Rankin (Stroke Patients Only)       Balance Overall balance assessment: Mild deficits observed, not formally tested                                           Pertinent Vitals/Pain Pain Assessment Pain Assessment: Faces Faces Pain Scale: Hurts little more Pain Location: headache Pain Descriptors / Indicators: Aching Pain Intervention(s): Limited activity within patient's tolerance, Monitored during session, Premedicated before session    Home Living Family/patient expects to be discharged to:: Private residence Living Arrangements: Non-relatives/Friends Available Help at Discharge: Friend(s) Type of Home: Apartment Home Access: Stairs to enter   Secretary/administrator of Steps: 1 Alternate Level Stairs-Number of Steps: 13 Home Layout: Two level Home Equipment: None      Prior Function Prior Level of Function : Independent/Modified Independent;Driving  Mobility Comments: walks without AD, no falls in 6 months; works at [employer] ADLs Comments: independent     Extremity/Trunk Assessment   Upper Extremity Assessment Upper Extremity Assessment: Defer to OT evaluation    Lower Extremity Assessment Lower Extremity Assessment: Overall WFL for tasks assessed    Cervical / Trunk Assessment Cervical / Trunk Assessment: Normal  Communication   Communication Communication: No apparent difficulties    Cognition Arousal:  Alert Behavior During Therapy: WFL for tasks assessed/performed   PT - Cognitive impairments: No apparent impairments                         Following commands: Intact       Cueing Cueing Techniques: Verbal cues     General Comments      Exercises General Exercises - Upper Extremity Shoulder Flexion: AROM, Both, 10 reps, Seated General Exercises - Lower Extremity Ankle Circles/Pumps: AROM, Both, 10 reps, Seated Long Arc Quad: AROM, Both, 15 reps, Seated Hip Flexion/Marching: AROM, Both, 10 reps, Seated   Assessment/Plan    PT Assessment Patient needs continued PT services  PT Problem List Cardiopulmonary status limiting activity;Decreased activity tolerance       PT Treatment Interventions Gait training;Therapeutic exercise;Balance training;Therapeutic activities    PT Goals (Current goals can be found in the Care Plan section)  Acute Rehab PT Goals Patient Stated Goal: likes to gamble, works at [employer] PT Goal Formulation: With patient Time For Goal Achievement: 03/19/24 Potential to Achieve Goals: Good    Frequency Min 3X/week     Co-evaluation               AM-PAC PT 6 Clicks Mobility  Outcome Measure Help needed turning from your back to your side while in a flat bed without using bedrails?: None Help needed moving from lying on your back to sitting on the side of a flat bed without using bedrails?: None Help needed moving to and from a bed to a chair (including a wheelchair)?: None Help needed standing up from a chair using your arms (e.g., wheelchair or bedside chair)?: None Help needed to walk in hospital room?: None Help needed climbing 3-5 steps with a railing? : None 6 Click Score: 24    End of Session Equipment Utilized During Treatment: Oxygen Activity Tolerance: Patient limited by fatigue Patient left: in chair;with call bell/phone within reach Nurse Communication: Mobility status;Other (comment) (hypoxia and tachycardia with  activity) PT Visit Diagnosis: Difficulty in walking, not elsewhere classified (R26.2)    Time: 8976-8946 PT Time Calculation (min) (ACUTE ONLY): 30 min   Charges:   PT Evaluation $PT Eval Moderate Complexity: 1 Mod PT Treatments $Gait Training: 8-22 mins PT General Charges $$ ACUTE PT VISIT: 1 Visit        [name] PT 03/05/2024  Acute Rehabilitation Services  Office [phone]

## 2024-03-05 NOTE — Telephone Encounter (Signed)
 Patient Product/process development scientist completed.    The patient is insured through [facility]. Patient has [employer], may use a copay card, and/or apply for patient assistance if available.    Ran test claim for Eliquis 5 mg and the current 30 day co-pay is $0.00.   This test claim was processed through [facility]- copay amounts may vary at other pharmacies due to pharmacy/plan contracts, or as the patient moves through the different stages of their insurance plan.     Reyes Sharps, CPHT Pharmacy Technician Patient Advocate Specialist Lead [facility] Patient Advocate Team Direct Number: [phone]  Fax: [phone]

## 2024-03-05 NOTE — TOC Progression Note (Addendum)
 Transition of Care [facility]) - Progression Note    Patient Details  Name: [name] MRN: [id number] Date of Birth: October 31, 1985  [facility] CM/SW Contact  Toyia Jelinek, Nathanel, RN Phone Number: 03/05/2024, 11:57 AM  Clinical Narrative: patient agreed to [facility] for home 02-rep Jermaine to deliver tank to rm prior d/c. Has own transport home. No HHC agency to accept d/t staffing/insurance.Patient already goes to [employer] PT-agree to [facility] [employer] PT-referral placed.    Expected Discharge Plan: Home/Self Care Barriers to Discharge: Continued Medical Work up               Expected Discharge Plan and Services   Discharge Planning Services: CM Consult Post Acute Care Choice: Durable Medical Equipment Living arrangements for the past 2 months: Single Family Home                 DME Arranged: Oxygen DME Agency: [employer]                   Social Drivers of Health (SDOH) Interventions SDOH Screenings   Food Insecurity: No Food Insecurity (03/02/2024)  Housing: Low Risk  (03/02/2024)  Transportation Needs: No Transportation Needs (03/02/2024)  Utilities: Not At Risk (03/02/2024)  Depression (PHQ2-9): Low Risk  (01/10/2024)  Recent Concern: Depression (PHQ2-9) - Medium Risk (10/21/2023)  Social Connections: Socially Integrated (09/01/2023)  Tobacco Use: Low Risk  (03/02/2024)    Readmission Risk Interventions    09/01/2023    9:43 AM  Readmission Risk Prevention Plan  Post Dischage Appt Complete  Medication Screening Complete  Transportation Screening Complete

## 2024-03-05 NOTE — Consult Note (Signed)
 NAME:  [name], MRN:  [id number], DOB:  May 07, 1985, LOS: 3 ADMISSION DATE:  03/02/2024, CONSULTATION DATE: 03/02/2024 REFERRING MD: Curtistine Dawn, MD, CHIEF COMPLAINT:  Pulmonary embolism   History of Present Illness:   The patient, with diastolic heart failure, RV failure and obesity,, presents with shortness of breath and a new diagnosis of pulmonary embolism.  Dyspnea - Shortness of breath present for one week - Initial suspicion for fluid retention related to diuretic use - Progressive worsening of symptoms - Current use of albuterol  inhaler as needed for shortness of breath  Pulmonary embolism - Noted on CTA in the emergency room - Onset of symptoms coincided with initiation of hormone pills (likely birth control) last month for menstrual cycle regulation - Recent six-hour car trip to [place]  and back while on hormone medication - No recent surgeries or prolonged immobility  Cardiac dysfunction - Diastolic heart failure and right-sided heart failure managed by Dr. Anner, cardiologist - History of pulmonary hypertension  Sleep-related breathing disorder - Potential sleep apnea under evaluation - Outpatient sleep consult scheduled for December  Asthma - History of childhood asthma but is well-controlled now - Current use of albuterol  inhaler as needed  Thromboembolic risk factors - Family history of blood clots on maternal side - Aunt with history of pulmonary embolism  Pertinent  Medical History    has a past medical history of Ankle ulcer (HCC), Birth control counseling (01/25/2014), Childhood asthma, Chronic lower back pain, Encounter for counseling regarding contraception (05/03/2023), GERD (gastroesophageal reflux disease), Headache, History of COVID-19 (12/22/2019), History of iritis (10/08/2013), Hypertension, Obesity, Pap smear for cervical cancer screening (01/06/2018), Varicose veins, and VSD (ventricular septal defect).   Significant Hospital  Events: Including procedures, antibiotic start and stop dates in addition to other pertinent events     Interim History / Subjective:    Objective    Blood pressure 107/89, pulse (!) 108, temperature 99.9 F (37.7 C), temperature source Oral, resp. rate 17, height 5' 6 (1.676 m), weight (!) 186.8 kg, last menstrual period 02/14/2024, SpO2 91%.        Intake/Output Summary (Last 24 hours) at 03/05/2024 0951 Last data filed at 03/05/2024 0300 Gross per 24 hour  Intake 689.27 ml  Output --  Net 689.27 ml   Filed Weights   03/02/24 1500 03/02/24 1800  Weight: (!) 186.8 kg (!) 186.8 kg    Examination: Obese female in no distress Lungs are clear to auscultation, heart rate regular rate and rhythm  Echo with severe reduction in RV systolic function, severely elevated PA systolic pressure Lower extremity duplex with superficial clot   Resolved problem list   Assessment and Plan  Acute bilateral segmental and subsegmental pulmonary embolism Likely related to recent initiation of hormone pills, long car ride, and predisposing factors such as obesity and suspected sleep apnea. Clots are peripheral and not large central clots, thus not amenable to interventional radiology. Current treatment with heparin  is appropriate to prevent new clot formation and aid in dissolving existing clots. - Continue heparin  drip.  Can change to DOAC - Advised discontinuation of hormone pills  Chronic right heart failure and diastolic heart failure with pulmonary hypertension Obstructive sleep apnea/OHS, suspected Likely exacerbated by sleep apnea and obesity. Right heart strain noted on CT scan, not solely due to recent pulmonary embolism. No need for aggressive interventional treatment at this time. - Continue follow-up with cardiologist.  May need right heart cath once evaluated and optimized for sleep apnea. - Has  appointment  with Dr. Neda, sleep specialist in December  PCCM will sign off.  Please call with questions.  Signature:   Anh Bigos MD [facility] See Amion for pager  If no response to pager , please call [phone] until 7pm After 7:00 pm call Elink  [phone] 03/05/2024, 9:51 AM

## 2024-03-06 ENCOUNTER — Inpatient Hospital Stay (HOSPITAL_COMMUNITY)

## 2024-03-06 DIAGNOSIS — I5032 Chronic diastolic (congestive) heart failure: Secondary | ICD-10-CM | POA: Diagnosis not present

## 2024-03-06 DIAGNOSIS — I50812 Chronic right heart failure: Secondary | ICD-10-CM | POA: Diagnosis not present

## 2024-03-06 DIAGNOSIS — R519 Headache, unspecified: Secondary | ICD-10-CM | POA: Diagnosis not present

## 2024-03-06 DIAGNOSIS — I2609 Other pulmonary embolism with acute cor pulmonale: Secondary | ICD-10-CM | POA: Diagnosis not present

## 2024-03-06 LAB — CBC
HCT: 44.2 % (ref 36.0–46.0)
Hemoglobin: 13.3 g/dL (ref 12.0–15.0)
MCH: 28.7 pg (ref 26.0–34.0)
MCHC: 30.1 g/dL (ref 30.0–36.0)
MCV: 95.3 fL (ref 80.0–100.0)
Platelets: 270 K/uL (ref 150–400)
RBC: 4.64 MIL/uL (ref 3.87–5.11)
RDW: 14.2 % (ref 11.5–15.5)
WBC: 8.4 K/uL (ref 4.0–10.5)
nRBC: 0 % (ref 0.0–0.2)

## 2024-03-06 LAB — MAGNESIUM: Magnesium: 2 mg/dL (ref 1.7–2.4)

## 2024-03-06 LAB — BASIC METABOLIC PANEL WITH GFR
Anion gap: 11 (ref 5–15)
BUN: 25 mg/dL — ABNORMAL HIGH (ref 6–20)
CO2: 26 mmol/L (ref 22–32)
Calcium: 9.1 mg/dL (ref 8.9–10.3)
Chloride: 99 mmol/L (ref 98–111)
Creatinine, Ser: 1.16 mg/dL — ABNORMAL HIGH (ref 0.44–1.00)
GFR, Estimated: >60 mL/min (ref >60)
Glucose, Bld: 141 mg/dL — ABNORMAL HIGH (ref 70–99)
Potassium: 4.3 mmol/L (ref 3.5–5.1)
Sodium: 135 mmol/L (ref 135–145)

## 2024-03-06 MED ORDER — KETOROLAC TROMETHAMINE 30 MG/ML IJ SOLN
15.0000 mg | Freq: Four times a day (QID) | INTRAMUSCULAR | Status: DC | PRN
  Administered 2024-03-07: 15 mg via INTRAVENOUS
  Filled 2024-03-06: qty 1

## 2024-03-06 MED ORDER — PROCHLORPERAZINE EDISYLATE 10 MG/2ML IJ SOLN
5.0000 mg | Freq: Four times a day (QID) | INTRAMUSCULAR | Status: DC | PRN
  Administered 2024-03-06 (×2): 5 mg via INTRAVENOUS
  Filled 2024-03-06 (×2): qty 2

## 2024-03-06 MED ORDER — SODIUM CHLORIDE 0.9 % IV BOLUS
500.0000 mL | Freq: Once | INTRAVENOUS | Status: AC
  Administered 2024-03-06: 500 mL via INTRAVENOUS

## 2024-03-06 NOTE — Progress Notes (Addendum)
 Occupational Therapy Treatment Patient Details Name: [name] MRN: [id number] DOB: Aug 31, 1985 Today's Date: 03/06/2024   History of present illness [name] is a 38 yo female presented with worsening shortness of breath. CTA chest showed bilateral PEs with right heart strain. She was started on heparin  drip PMH: morbid obesity and chronic diastolic heart failure   OT comments  Patient seen for skilled OT this afternoon. Issued and trained in reacher use and ECT principles with written handout provided reinforcing pacing, breathing integration with activity, and positioning during A/IADL's. Patient on RA briefly at 92% SpO2 at rest seated and requested short amb in hallway without O2. Sats fell to 82% SpO2 on RA and replaced 2 ltrs O2 via [place] and sats improved to 94% after 1 minute. OT reinforced need to use O2 for all mobility as per Md orders with patient verbalizing understanding. Patient requires continued Acute care hospital level OT services to progress safety and functional performance and allow for discharge.        If plan is discharge home, recommend the following:  Assist for transportation   Equipment Recommendations  None recommended by OT       Precautions / Restrictions Precautions Precautions: Other (comment) (O2 sats) Recall of Precautions/Restrictions: Intact Precaution/Restrictions Comments: monitor O2 Restrictions Weight Bearing Restrictions Per Provider Order: No       Mobility Bed Mobility Overal bed mobility: Modified Independent             General bed mobility comments: was at EOB with no issues reported in and out all day    Transfers Overall transfer level: Independent Equipment used: None               General transfer comment: self powering up with momentum     Balance Overall balance assessment: Mild deficits observed, not formally tested                                         ADL either performed or  assessed with clinical judgement   ADL Overall ADL's : Modified independent                                       General ADL Comments: issued ECT Handouts and reacher and instructed in use for LB reach and IADL safety    Extremity/Trunk Assessment Upper Extremity Assessment Upper Extremity Assessment: Right hand dominant;Overall WFL for tasks assessed   Lower Extremity Assessment Lower Extremity Assessment: Overall WFL for tasks assessed                 Communication Communication Communication: No apparent difficulties   Cognition Arousal: Alert Behavior During Therapy: WFL for tasks assessed/performed Cognition: No apparent impairments                               Following commands: Intact        Cueing   Cueing Techniques: Verbal cues  Exercises Exercises: Other exercises (issued foam resistance ball for grasp 20 reps x 3 sets bly rec q hr for edema mngt) General Exercises - Lower Extremity Ankle Circles/Pumps: AROM, Both, 10 reps, Seated       General Comments compression hose in place, on RA at rest with sats 92%,  patient asked to amb 50 ft in hallway without O2 to trial ability and dropped to 82%. Applied 2 ltrs O2 and sats rebounded to 94%, reinforced need to use O2 at rest and while ambulating during this recover period andpatient demo understanding    Pertinent Vitals/ Pain       Pain Assessment Pain Assessment: Faces Faces Pain Scale: Hurts a little bit Pain Location: headache Pain Descriptors / Indicators: Aching Pain Intervention(s): Limited activity within patient's tolerance, Monitored during session, Premedicated before session   Frequency  Min 2X/week        Progress Toward Goals  OT Goals(current goals can now be found in the care plan section)  Progress towards OT goals: Progressing toward goals  Acute Rehab OT Goals Patient Stated Goal: to feel better OT Goal Formulation: With patient Time For Goal  Achievement: 03/19/24 Potential to Achieve Goals: Good ADL Goals Pt Will Perform Lower Body Dressing: with modified independence;with adaptive equipment;sitting/lateral leans;sit to/from stand Pt Will Perform Tub/Shower Transfer: Tub transfer;shower seat;ambulating Pt/caregiver will Perform Home Exercise Program: Increased strength;With theraband;Independently;With written HEP provided Additional ADL Goal #1: Patient will teach back 4/5 ECT strategies with min cues for B/IADL's including breathing  Plan         AM-PAC OT 6 Clicks Daily Activity     Outcome Measure   Help from another person eating meals?: None Help from another person taking care of personal grooming?: None Help from another person toileting, which includes using toliet, bedpan, or urinal?: None Help from another person bathing (including washing, rinsing, drying)?: None Help from another person to put on and taking off regular upper body clothing?: None Help from another person to put on and taking off regular lower body clothing?: None 6 Click Score: 24    End of Session Equipment Utilized During Treatment: Oxygen;Gait belt  OT Visit Diagnosis: Unsteadiness on feet (R26.81)   Activity Tolerance Patient tolerated treatment well   Patient Left in bed (EOB)   Nurse Communication Mobility status;Other (comment) (VSR)        Time: 1330-1400 OT Time Calculation (min): 30 min  Charges: OT General Charges $OT Visit: 1 Visit OT Treatments $Self Care/Home Management : 8-22 mins $Therapeutic Exercise: 8-22 mins  Kaly Mcquary OT/L Acute Rehabilitation Department  [phone]  03/06/2024, 4:27 PM

## 2024-03-06 NOTE — Progress Notes (Signed)
 PROGRESS NOTE    [name]  FMW:[id number] DOB: Jul 31, 1985 DOA: 03/02/2024 PCP: Alba Sharper, MD    Chief Complaint  Patient presents with   Shortness of Breath    Brief Narrative:  38 year old female with history of morbid obesity and chronic diastolic heart failure presented with worsening shortness of breath. On presentation, CTA chest showed bilateral PEs with right heart strain. She was started on heparin  drip. Pulmonary was consulted.    Assessment & Plan:   Principal Problem:   Acute pulmonary embolism with acute cor pulmonale (HCC) Active Problems:   Morbid obesity with BMI of 60.0-69.9, adult (HCC)   Menorrhagia   Suspected sleep apnea   Chronic diastolic CHF (congestive heart failure) (HCC)   Chronic right heart failure (HCC)   Acute nonintractable headache  #1 bilateral segmental and subsegmental PE/submassive PE with right ventricular strain -Likely secondary to recent initiation of OCP as symptoms coincided with initiation of hormone pills for menstrual cycle regulation. - Patient also noted to have had a recent 6-hour car trip to [place]  and back while on hormone medication. - Patient with no recent surgeries and no recent prolonged immobility. -CT angiogram chest done with bilateral PEs and right heart strain noted - Lower extremity Dopplers negative for DVT however noted bilateral acute superficial vein thrombosis involving varicosities or other superficial veins. - 2D echo with a EF of 60 to 65%, NWMA, interventricular septum flattened in systole and diastole consistent with right ventricular pressure and volume overload.  Severely reduced right ventricular systolic function.  Severely enlarged right ventricular size.  Severely elevated pulmonary artery systolic pressure with estimated right ventricular systolic pressure of 83.9 mmHg.  Severely dilated right atrial size.  Trivial MVR.  Mild to moderate TVR.   - Patient with soft blood pressure however  systolic blood pressure consistently above 100. - Was on IV heparin  and has been transitioned to DOAC/Eliquis . -Hold Lasix  today. -Patient advised on discontinuation of hormone pills. - Patient seen in consultation by pulmonary/PCCM who feels no acute intervention needed at this time. - Outpatient follow-up with pulmonary, patient already has an appointment with Dr. Neda, sleep specialist in December. - Likely need home O2 on discharge.  2.  Chronic diastolic heart failure/chronic right heart failure with pulmonary hypertension -Strict I's and O's, daily weights. - Blood pressure is soft.   - Hold oral Lasix .   - May need to decrease home dose Lasix  by half on discharge due to soft/borderline blood pressure.  - Outpatient follow-up with cardiology.  3.  Probable OSA -Being followed in the outpatient setting with pulmonary and being set up for outpatient sleep study. - Patient already has outpatient appointment with pulmonary, Dr. Neda, sleep specialist in December  4.  Morbid obesity/obesity class III -Lifestyle modification. - Outpatient follow-up with PCP.  5.  Headache - Patient given Compazine  and Toradol  with improvement with headache.   -Patient states usually get headaches in the morning which improves with oral intake/hydration per patient. -BP soft. -Hold diuretics today. -Normal saline 500 cc bolus x 1 - Continue headache cocktail of Toradol  and Compazine .  - CT head.   DVT prophylaxis: Heparin  GTT>>> Eliquis  Code Status: Full Family Communication: Updated patient.  Disposition: Home when clinically improved and cleared by pulmonary and on oral anticoagulation in the next 24 to 48 hours.  Status is: Inpatient Remains inpatient appropriate because: Severity of illness   Consultants:  PCCM: Dr. Theophilus 03/02/2024  Procedures:  CT angiogram chest 03/02/2024 Chest x-ray 03/02/2024  2D echo 03/04/2024 Lower extremity Dopplers 03/03/2024  Antimicrobials:   Anti-infectives (From admission, onward)    None         Subjective: Patient sitting up at side of bed eating lunch.  Patient with complaints of headache that occurred this morning.  Patient states she and her mother are concerned and would like some imaging done of her head to rule out any other etiologies.  Patient denies any chest pain.  Still with some shortness of breath but slowly improving.  Denies any bleeding.   Objective: Vitals:   03/05/24 1343 03/05/24 2051 03/06/24 0619 03/06/24 0627  BP: 99/64 100/78 (!) 102/44 100/64  Pulse: 94 91 97 92  Resp:  18 20   Temp: 98.9 F (37.2 C) 98.3 F (36.8 C) 98 F (36.7 C)   TempSrc: Oral Oral    SpO2: (!) 87% 94% 91%   Weight:      Height:        Intake/Output Summary (Last 24 hours) at 03/06/2024 1331 Last data filed at 03/05/2024 2100 Gross per 24 hour  Intake 240 ml  Output --  Net 240 ml   Filed Weights   03/02/24 1500 03/02/24 1800  Weight: (!) 186.8 kg (!) 186.8 kg    Examination:  General exam: NAD Respiratory system: Lungs clear to auscultation bilaterally.  No wheezes, no crackles, no rhonchi.  Fair air movement.  Speaking in full sentences.  Cardiovascular system: RRR no murmurs rubs or gallops.  No JVD.  Trace to 1+ bilateral lower extremity edema.  Gastrointestinal system: Abdomen is soft, nontender, nondistended, positive bowel sounds.  No rebound.  No guarding.  Central nervous system: Alert and oriented. No focal neurological deficits. Extremities: Symmetric 5 x 5 power. Skin: No rashes, lesions or ulcers Psychiatry: Judgement and insight appear normal. Mood & affect appropriate.     Data Reviewed: I have personally reviewed following labs and imaging studies  CBC: Recent Labs  Lab 03/02/24 1330 03/03/24 0539 03/04/24 0529 03/05/24 0557 03/06/24 0516  WBC 8.4 8.6 9.1 7.9 8.4  HGB 14.5 13.7 13.7 13.7 13.3  HCT 45.9 45.1 44.7 45.4 44.2  MCV 92.7 94.9 95.5 95.4 95.3  PLT 205 207 238 253  270    Basic Metabolic Panel: Recent Labs  Lab 03/02/24 1330 03/03/24 0539 03/04/24 0529 03/05/24 0557 03/06/24 0516  NA 138 139 137 137 135  K 3.8 3.5 4.1 4.0 4.3  CL 97* 98 98 100 99  CO2 31 29 28 26 26   GLUCOSE 154* 143* 139* 134* 141*  BUN 13 16 14 20  25*  CREATININE 1.09* 0.89 0.94 1.00 1.16*  CALCIUM 9.2 9.1 8.9 9.6 9.1  MG  --  2.3 2.1  --  2.0    GFR: Estimated Creatinine Clearance: 114.5 mL/min (A) (by C-G formula based on SCr of 1.16 mg/dL (H)).  Liver Function Tests: Recent Labs  Lab 03/05/24 0557  ALBUMIN 3.6    CBG: No results for input(s): GLUCAP in the last 168 hours.   No results found for this or any previous visit (from the past 240 hours).       Radiology Studies: No results found.       Scheduled Meds:  apixaban   10 mg Oral BID   Followed by   NOREEN ON 03/12/2024] apixaban   5 mg Oral BID   potassium chloride   20 mEq Oral BID   Continuous Infusions:     LOS: 4 days    Time spent: 40 minutes  Toribio Hummer, MD [facility]   To contact the attending provider between 7A-7P or the covering provider during after hours 7P-7A, please log into the web site [email or web address] and access using universal  password for that web site. If you do not have the password, please call the hospital operator.  03/06/2024, 1:31 PM

## 2024-03-07 ENCOUNTER — Other Ambulatory Visit (HOSPITAL_COMMUNITY): Payer: Self-pay

## 2024-03-07 DIAGNOSIS — I5032 Chronic diastolic (congestive) heart failure: Secondary | ICD-10-CM | POA: Diagnosis not present

## 2024-03-07 DIAGNOSIS — R519 Headache, unspecified: Secondary | ICD-10-CM | POA: Diagnosis not present

## 2024-03-07 DIAGNOSIS — I2609 Other pulmonary embolism with acute cor pulmonale: Secondary | ICD-10-CM | POA: Diagnosis not present

## 2024-03-07 DIAGNOSIS — I50812 Chronic right heart failure: Secondary | ICD-10-CM | POA: Diagnosis not present

## 2024-03-07 LAB — CBC
HCT: 42.3 % (ref 36.0–46.0)
Hemoglobin: 13 g/dL (ref 12.0–15.0)
MCH: 29 pg (ref 26.0–34.0)
MCHC: 30.7 g/dL (ref 30.0–36.0)
MCV: 94.4 fL (ref 80.0–100.0)
Platelets: 286 K/uL (ref 150–400)
RBC: 4.48 MIL/uL (ref 3.87–5.11)
RDW: 14.2 % (ref 11.5–15.5)
WBC: 7.6 K/uL (ref 4.0–10.5)
nRBC: 0 % (ref 0.0–0.2)

## 2024-03-07 LAB — BASIC METABOLIC PANEL WITH GFR
Anion gap: 9 (ref 5–15)
BUN: 19 mg/dL (ref 6–20)
CO2: 27 mmol/L (ref 22–32)
Calcium: 9.1 mg/dL (ref 8.9–10.3)
Chloride: 100 mmol/L (ref 98–111)
Creatinine, Ser: 0.86 mg/dL (ref 0.44–1.00)
GFR, Estimated: >60 mL/min (ref >60)
Glucose, Bld: 134 mg/dL — ABNORMAL HIGH (ref 70–99)
Potassium: 4.2 mmol/L (ref 3.5–5.1)
Sodium: 136 mmol/L (ref 135–145)

## 2024-03-07 MED ORDER — APIXABAN (ELIQUIS) VTE STARTER PACK (10MG AND 5MG)
ORAL_TABLET | ORAL | 0 refills | Status: DC
  Filled 2024-03-07: qty 74, 30d supply, fill #0

## 2024-03-07 MED ORDER — POTASSIUM CHLORIDE CRYS ER 20 MEQ PO TBCR: 20.0000 meq | EXTENDED_RELEASE_TABLET | Freq: Two times a day (BID) | ORAL | Status: DC

## 2024-03-07 NOTE — Progress Notes (Signed)
 Pt identified as potential morning discharge tomorrow. Discharge checklist reviewed with pt. Pt with no barriers to discharge except having medications delivered prior to d/c.

## 2024-03-07 NOTE — Progress Notes (Signed)
 Discharge med in  a secure bag delivered to patient in room

## 2024-03-07 NOTE — Progress Notes (Signed)
 PROGRESS NOTE    [name]  FMW:[id number] DOB: 10-17-1985 DOA: 03/02/2024 PCP: Alba Sharper, MD    Chief Complaint  Patient presents with   Shortness of Breath    Brief Narrative:  38 year old female with history of morbid obesity and chronic diastolic heart failure presented with worsening shortness of breath. On presentation, CTA chest showed bilateral PEs with right heart strain. She was started on heparin  drip. Pulmonary was consulted.    Assessment & Plan:   Principal Problem:   Acute pulmonary embolism with acute cor pulmonale (HCC) Active Problems:   Morbid obesity with BMI of 60.0-69.9, adult (HCC)   Menorrhagia   Suspected sleep apnea   Chronic diastolic CHF (congestive heart failure) (HCC)   Chronic right heart failure (HCC)   Acute nonintractable headache  #1 bilateral segmental and subsegmental PE/submassive PE with right ventricular strain -Likely secondary to recent initiation of OCP as symptoms coincided with initiation of hormone pills for menstrual cycle regulation. - Patient also noted to have had a recent 6-hour car trip to [place]  and back while on hormone medication. - Patient with no recent surgeries and no recent prolonged immobility. -CT angiogram chest done with bilateral PEs and right heart strain noted - Lower extremity Dopplers negative for DVT however noted bilateral acute superficial vein thrombosis involving varicosities or other superficial veins. - 2D echo with a EF of 60 to 65%, NWMA, interventricular septum flattened in systole and diastole consistent with right ventricular pressure and volume overload.  Severely reduced right ventricular systolic function.  Severely enlarged right ventricular size.  Severely elevated pulmonary artery systolic pressure with estimated right ventricular systolic pressure of 83.9 mmHg.  Severely dilated right atrial size.  Trivial MVR.  Mild to moderate TVR.   - Patient with soft blood pressure however  systolic blood pressure consistently above 100. - Was on IV heparin  and has been transitioned to DOAC/Eliquis . -Continue to hold diuretics. -Patient advised on discontinuation of hormone pills. - Patient seen in consultation by pulmonary/PCCM who feels no acute intervention needed at this time. - Outpatient follow-up with pulmonary, patient already has an appointment with Dr. Neda, sleep specialist in December. - Likely need home O2 on discharge.  2.  Chronic diastolic heart failure/chronic right heart failure with pulmonary hypertension -Strict I's and O's, daily weights. - Blood pressure is soft.   - Continue to hold oral Lasix .   - May need to decrease home dose Lasix  by half on discharge due to soft/borderline blood pressure.  - Outpatient follow-up with cardiology.  3.  Probable OSA -Being followed in the outpatient setting with pulmonary and being set up for outpatient sleep study. - Patient already has outpatient appointment with pulmonary, Dr. Neda, sleep specialist in December  4.  Morbid obesity/obesity class III -Lifestyle modification. - Outpatient follow-up with PCP.  5.  Headache - Patient given Compazine  and Toradol  with improvement with headache.   -Patient states usually get headaches in the morning which improves with oral intake/hydration per patient. -BP soft. -CT head negative for any acute abnormalities. - Continue to hold diuretics.  - Continue headache cocktail of Toradol  and Compazine .    DVT prophylaxis: Heparin  GTT>>> Eliquis  Code Status: Full Family Communication: Updated patient.  Disposition: Home when clinically improved and cleared by pulmonary and on oral anticoagulation in the next 24 hours.   Status is: Inpatient Remains inpatient appropriate because: Severity of illness   Consultants:  PCCM: Dr. Theophilus 03/02/2024  Procedures:  CT angiogram chest 03/02/2024 Chest  x-ray 03/02/2024 2D echo 03/04/2024 Lower extremity Dopplers  03/03/2024 CT head 03/06/2024  Antimicrobials:  Anti-infectives (From admission, onward)    None         Subjective: Patient sitting up in bed.  Mother on speaker phone.  Patient denies any chest pain.  Still with some shortness of breath but improving.  Denies any bleeding.  Noted to have had a headache earlier on this morning which improved with Tylenol .   Objective: Vitals:   03/06/24 1425 03/06/24 1946 03/07/24 0338 03/07/24 1338  BP: 123/80 (!) 101/50 115/62 101/66  Pulse: 93 96 96 89  Resp:  20 (!) 22   Temp: 98.9 F (37.2 C) (!) 97.3 F (36.3 C) 98.3 F (36.8 C) 98.6 F (37 C)  TempSrc: Oral Oral Oral Oral  SpO2: 94% 97% 94% 91%  Weight:      Height:        Intake/Output Summary (Last 24 hours) at 03/07/2024 1347 Last data filed at 03/07/2024 0800 Gross per 24 hour  Intake 947.85 ml  Output --  Net 947.85 ml   Filed Weights   03/02/24 1500 03/02/24 1800  Weight: (!) 186.8 kg (!) 186.8 kg    Examination:  General exam: NAD Respiratory system: CTAB.  No wheezes, no crackles, no rhonchi.  Fair air movement.  Speaking in full sentences. Cardiovascular system: Regular rate and rhythm no murmurs rubs or gallops.  No JVD.  Trace to 1+ bilateral lower extremity edema. Gastrointestinal system: Abdomen soft, nontender, nondistended, positive bowel sounds.  No rebound.  No guarding.  Central nervous system: Alert and oriented. No focal neurological deficits. Extremities: Symmetric 5 x 5 power. Skin: No rashes, lesions or ulcers Psychiatry: Judgement and insight appear normal. Mood & affect appropriate.     Data Reviewed: I have personally reviewed following labs and imaging studies  CBC: Recent Labs  Lab 03/03/24 0539 03/04/24 0529 03/05/24 0557 03/06/24 0516 03/07/24 0559  WBC 8.6 9.1 7.9 8.4 7.6  HGB 13.7 13.7 13.7 13.3 13.0  HCT 45.1 44.7 45.4 44.2 42.3  MCV 94.9 95.5 95.4 95.3 94.4  PLT 207 238 253 270 286    Basic Metabolic Panel: Recent  Labs  Lab 03/03/24 0539 03/04/24 0529 03/05/24 0557 03/06/24 0516 03/07/24 0559  NA 139 137 137 135 136  K 3.5 4.1 4.0 4.3 4.2  CL 98 98 100 99 100  CO2 29 28 26 26 27   GLUCOSE 143* 139* 134* 141* 134*  BUN 16 14 20  25* 19  CREATININE 0.89 0.94 1.00 1.16* 0.86  CALCIUM 9.1 8.9 9.6 9.1 9.1  MG 2.3 2.1  --  2.0  --     GFR: Estimated Creatinine Clearance: 154.4 mL/min (by C-G formula based on SCr of 0.86 mg/dL).  Liver Function Tests: Recent Labs  Lab 03/05/24 0557  ALBUMIN 3.6    CBG: No results for input(s): GLUCAP in the last 168 hours.   No results found for this or any previous visit (from the past 240 hours).       Radiology Studies: CT HEAD WO CONTRAST ( ) Result Date: 03/06/2024 EXAM: CT HEAD WITHOUT CONTRAST 03/06/2024 06:25:00 PM TECHNIQUE: CT of the head was performed without the administration of intravenous contrast. Automated exposure control, iterative reconstruction, and/or weight based adjustment of the mA/kV was utilized to reduce the radiation dose to as low as reasonably achievable. COMPARISON: CT head without contrast 01/16/2018. CLINICAL HISTORY: Headache, classic migraine. FINDINGS: BRAIN AND VENTRICLES: No acute hemorrhage. No evidence of acute  infarct. No hydrocephalus. No extra-axial collection. No mass effect or midline shift. ORBITS: No acute abnormality. SINUSES: No acute abnormality. SOFT TISSUES AND SKULL: No acute soft tissue abnormality. No skull fracture. IMPRESSION: 1. No acute intracranial abnormality. Electronically signed by: Lonni Necessary MD 03/06/2024 06:36 PM EST RP Workstation: [id number]         Scheduled Meds:  apixaban   10 mg Oral BID   Followed by   NOREEN ON 03/12/2024] apixaban   5 mg Oral BID   [START ON 03/08/2024] potassium chloride   20 mEq Oral BID   Continuous Infusions:     LOS: 5 days    Time spent: 40 minutes    Toribio Hummer, MD [facility]   To contact the attending provider  between 7A-7P or the covering provider during after hours 7P-7A, please log into the web site [email or web address] and access using universal [place] password for that web site. If you do not have the password, please call the hospital operator.  03/07/2024, 1:47 PM

## 2024-03-07 NOTE — Plan of Care (Signed)
  Problem: Education: Goal: Knowledge of General Education information will improve Description: Including pain rating scale, medication(s)/side effects and non-pharmacologic comfort measures Outcome: Progressing   Problem: Health Behavior/Discharge Planning: Goal: Ability to manage health-related needs will improve Outcome: Progressing   Problem: Clinical Measurements: Goal: Ability to maintain clinical measurements within normal limits will improve Outcome: Progressing Goal: Will remain free from infection Outcome: Progressing Goal: Diagnostic test results will improve Outcome: Progressing Goal: Respiratory complications will improve Outcome: Progressing Goal: Cardiovascular complication will be avoided Outcome: Progressing   Problem: Activity: Goal: Risk for activity intolerance will decrease Outcome: Progressing   Problem: Nutrition: Goal: Adequate nutrition will be maintained Outcome: Progressing Anticipate discharge tomorrow

## 2024-03-07 NOTE — Progress Notes (Signed)
 Patient given a printed copy of Eliquis  copay card with info -placed in envelope with discharge med and left with patient

## 2024-03-08 ENCOUNTER — Other Ambulatory Visit (HOSPITAL_COMMUNITY): Payer: Self-pay

## 2024-03-08 DIAGNOSIS — R29818 Other symptoms and signs involving the nervous system: Secondary | ICD-10-CM | POA: Diagnosis not present

## 2024-03-08 DIAGNOSIS — I2609 Other pulmonary embolism with acute cor pulmonale: Secondary | ICD-10-CM | POA: Diagnosis not present

## 2024-03-08 DIAGNOSIS — R519 Headache, unspecified: Secondary | ICD-10-CM | POA: Diagnosis not present

## 2024-03-08 DIAGNOSIS — I5032 Chronic diastolic (congestive) heart failure: Secondary | ICD-10-CM | POA: Diagnosis not present

## 2024-03-08 LAB — CBC
HCT: 43.3 % (ref 36.0–46.0)
Hemoglobin: 13.1 g/dL (ref 12.0–15.0)
MCH: 28.8 pg (ref 26.0–34.0)
MCHC: 30.3 g/dL (ref 30.0–36.0)
MCV: 95.2 fL (ref 80.0–100.0)
Platelets: 280 K/uL (ref 150–400)
RBC: 4.55 MIL/uL (ref 3.87–5.11)
RDW: 14.2 % (ref 11.5–15.5)
WBC: 6.9 K/uL (ref 4.0–10.5)
nRBC: 0 % (ref 0.0–0.2)

## 2024-03-08 LAB — BASIC METABOLIC PANEL WITH GFR
Anion gap: 7 (ref 5–15)
BUN: 19 mg/dL (ref 6–20)
CO2: 28 mmol/L (ref 22–32)
Calcium: 9 mg/dL (ref 8.9–10.3)
Chloride: 101 mmol/L (ref 98–111)
Creatinine, Ser: 0.9 mg/dL (ref 0.44–1.00)
GFR, Estimated: >60 mL/min (ref >60)
Glucose, Bld: 102 mg/dL — ABNORMAL HIGH (ref 70–99)
Potassium: 4.7 mmol/L (ref 3.5–5.1)
Sodium: 135 mmol/L (ref 135–145)

## 2024-03-08 MED ORDER — POTASSIUM CHLORIDE CRYS ER 10 MEQ PO TBCR
10.0000 meq | EXTENDED_RELEASE_TABLET | Freq: Every day | ORAL | 1 refills | Status: AC
  Filled 2024-03-08: qty 30, 30d supply, fill #0

## 2024-03-08 MED ORDER — FUROSEMIDE 40 MG PO TABS: 40.0000 mg | ORAL_TABLET | Freq: Every day | ORAL | Status: AC

## 2024-03-08 NOTE — Discharge Summary (Signed)
 Physician Discharge Summary  SOLENNE MANWARREN FMW:[id number] DOB: [birth year] DOA: 03/02/2024  PCP: Alba Sharper, MD  Admit date: 03/02/2024 Discharge date: 03/08/2024  Time spent: 60 minutes  Recommendations for Outpatient Follow-up:  Follow-up with Alba Sharper, MD in 2 weeks.  On follow-up patient will need ambulatory sats done to see if patient needs ongoing oxygen.  Patient will need follow-up on PE.  Patient will need a basic metabolic profile done to follow-up on electrolytes and renal function.  Patient will need a CBC done to follow-up on counts.  Patient advised on discontinuation of hormone pills.  Patient will need follow-up on her headaches. Follow-up with Dr. Anner, cardiology in 1 to 2 weeks.  Patient is diuretics with decreased on discharge during the hospitalization due to soft blood pressure.  Patient will need further evaluation and management of cardiac medications. Follow-up with Dr. Neda as scheduled.  Patient will need follow-up on pulmonary embolism and further recommendations.  Patient also to follow-up for evaluation for OSA.   Discharge Diagnoses:  Principal Problem:   Acute pulmonary embolism with acute cor pulmonale (HCC) Active Problems:   Morbid obesity with BMI of 60.0-69.9, adult (HCC)   Menorrhagia   Suspected sleep apnea   Chronic diastolic CHF (congestive heart failure) (HCC)   Chronic right heart failure (HCC)   Acute nonintractable headache   Discharge Condition: Stable and improved.  Diet recommendation: Heart healthy  Filed Weights   03/02/24 1500 03/02/24 1800  Weight: (!) 186.8 kg (!) 186.8 kg    History of present illness:  HPI per Dr. Arthea [name] is a 38 y.o. female with medical history significant for obesity and chronic.  1 diastolic heart failure on Lasix  who presents with several days of increasing shortness of breath.  The the patient says it became significant 3 days ago.  He was at work and she kept  having to stop and sit down.  She felt very anxious.  She thought maybe she was just overwhelmed and stressed from work.  The next day she did not have to go in and stayed home.  The day after that she called in because she was short of breath that morning before leaving the house.  Today she went into work but was so short of breath she could could not do her job.  She left work and came to the hospital.  She has been taking her Lasix  every day.  That is one of the reason she came in because normally when she feels a little short of breath the Lasix  fixes it. The patient reports that she recently started on birth control pills to help control the heavy bleeding that she has with her periods.  Last month was her first month.  She is currently on the third week of her second month. In the emergency department a CTA was done which showed bilateral PEs with right heart strain.  The pulmonologist were consulted but felt that she does not need thrombolytics as her right heart strain may be not all due to the PE but possibly from her pulmonary hypertension due to her chronic right heart failure and likely obstructive sleep apnea.  The patient was put on IV heparin  in the emergency department.  She will also be placed on 2 L O2 nasal cannula and admitted by the hospitalist service.  Hospital Course:  #1 bilateral segmental and subsegmental PE/submassive PE with right ventricular strain -Likely secondary to recent initiation of OCP as symptoms coincided with  initiation of hormone pills for menstrual cycle regulation. - Patient also noted to have had a recent 6-hour car trip to [place]  and back while on hormone medication. - Patient with no recent surgeries and no recent prolonged immobility. -CT angiogram chest done with bilateral PEs and right heart strain noted - Lower extremity Dopplers negative for DVT however noted bilateral acute superficial vein thrombosis involving varicosities or other superficial  veins. - 2D echo with a EF of 60 to 65%, NWMA, interventricular septum flattened in systole and diastole consistent with right ventricular pressure and volume overload.  Severely reduced right ventricular systolic function.  Severely enlarged right ventricular size.  Severely elevated pulmonary artery systolic pressure with estimated right ventricular systolic pressure of 83.9 mmHg.  Severely dilated right atrial size.  Trivial MVR.  Mild to moderate TVR.   - Patient with soft blood pressure however systolic blood pressure consistently above 100. - Was on IV heparin  and subsequently transitioned to DOAC/Eliquis . -Due to soft blood pressure patient is diuretics were held for the latter part of the hospitalization. -Patient advised on discontinuation of hormone pills. - Patient seen in consultation by pulmonary/PCCM who feels no acute intervention needed at this time. - Outpatient follow-up with pulmonary, patient already has an appointment with Dr. Neda, sleep specialist in December. - Likely need home O2 on discharge. -Will need ambulatory sats done on outpatient follow-up.   2.  Chronic diastolic heart failure/chronic right heart failure with pulmonary hypertension -Strict I's and O's, daily weights. - Blood pressure noted to be soft during the hospitalization.  Patient also noted with some associated headaches.  Soft blood pressure which she states at home improved with hydration.   - Patient's diuretics were held for the latter part of the hospitalization as patient noted to have soft blood pressure and patient noted to be euvolemic.   - Patient's diuretics will be resumed on discharge at half her home dose and will be discharged on 40 mg daily.   - Outpatient follow-up with primary cardiologist Dr. Anner.    3.  Probable OSA -Being followed in the outpatient setting with pulmonary and being set up for outpatient sleep study. - Patient already has outpatient appointment with pulmonary,  Dr. Neda, sleep specialist in December. - Outpatient follow-up.   4.  Morbid obesity/obesity class III -Lifestyle modification. - Outpatient follow-up with PCP.   5.  Headache - Patient given Compazine  and Toradol  with improvement with headache.   -Patient states usually get headaches in the morning which improves with oral intake/hydration per patient. -BP soft. -Diuretics held during the latter part of the hospitalization with some improvement with headaches. -CT head negative for any acute abnormalities. - Diuretics will be resumed at half home dose. -Outpatient follow-up with PCP.      Procedures: CT angiogram chest 03/02/2024 Chest x-ray 03/02/2024 2D echo 03/04/2024 Lower extremity Dopplers 03/03/2024 CT head 03/06/2024  Consultations: PCCM: Dr. Theophilus 03/02/2024    Discharge Exam: Vitals:   03/07/24 2046 03/08/24 0514  BP: (!) 100/55 (!) 103/51  Pulse: 91 93  Resp: 16   Temp: 98.7 F (37.1 C) 98.2 F (36.8 C)  SpO2: 98% 92%    General: NAD Cardiovascular: RRR no murmurs rubs or gallops.  No JVD.  Trace bilateral lower extremity edema. Respiratory: Clear to auscultation bilaterally.  No wheezes, no crackles, no rhonchi.  Fair air movement.  Speaking in full sentences.  Discharge Instructions   Discharge Instructions     Ambulatory referral to Physical Therapy  Complete by: As directed    Diet - low sodium heart healthy   Complete by: As directed    Increase activity slowly   Complete by: As directed       Allergies as of 03/08/2024       Reactions   Tramadol  Other (See Comments)   Made the patient feel not like herself        Medication List     STOP taking these medications    ibuprofen  800 MG tablet Commonly known as: ADVIL    norgestimate -ethinyl estradiol  0.25-35 MG-MCG tablet Commonly known as: Sprintec 28   potassium chloride  20 MEQ packet Commonly known as: KLOR-CON        TAKE these medications    albuterol  108 (90  Base) MCG/ACT inhaler Commonly known as: VENTOLIN  HFA Inhale 2 puffs into the lungs every 6 (six) hours as needed for wheezing or shortness of breath.   [employer] D3 125 MCG (5000 UT) capsule Generic drug: Cholecalciferol  TAKE 1 TABLET BY MOUTH ONCE A WEEK. FOR 8 WEEKS What changed: See the new instructions.   Eliquis  DVT/PE Starter Pack Generic drug: Apixaban  Starter Pack (10mg  and 5mg ) Take as directed on package: start with two-5mg  tablets twice daily for 7 days. On day 8, switch to one-5mg  tablet twice daily.   furosemide  40 MG tablet Commonly known as: LASIX  Take 1 tablet (40 mg total) by mouth daily. May take an additional 40 mg if weight is greater 3 lbs or more Start taking on: March 09, 2024 What changed:  how much to take additional instructions   methocarbamol  500 MG tablet Commonly known as: ROBAXIN  Take 500 mg by mouth every 6 (six) hours as needed for muscle spasms. What changed: Another medication with the same name was removed. Continue taking this medication, and follow the directions you see here.   potassium chloride  10 MEQ tablet Commonly known as: KLOR-CON  M Take 1 tablet (10 mEq total) by mouth daily. Start taking on: March 09, 2024   Tylenol  8 Hour Arthritis Pain 650 MG CR tablet Generic drug: acetaminophen  Take 650-1,300 mg by mouth every 8 (eight) hours as needed for pain.               Durable Medical Equipment  (From admission, onward)           Start     Ordered   03/05/24 1059  For home use only DME oxygen  Once       Question Answer Comment  Length of Need 6 Months   Mode or (Route) Nasal cannula   Liters per Minute 4   Frequency Continuous (stationary and portable oxygen unit needed)   Oxygen conserving device Yes   Oxygen delivery system: Gas      03/05/24 1058           Allergies  Allergen Reactions   Tramadol  Other (See Comments)    Made the patient feel not like herself    Follow-up Information     [facility] Follow up.   Specialty: Rehabilitation Why: they will call you for appt. Contact information: [address] [place]  [address] [phone]        [facility] (DME) Follow up.   Specialty: DME Services Why: oxygen Contact information: [address] [place]  [address] [phone]        Alba Sharper, MD. Schedule an appointment as soon as possible for a visit  in 2 week(s).   Specialty: Family Medicine Contact information: [address] [place] [address] [phone]         Neda Jennet LABOR, MD Follow up.   Specialty: Pulmonary Disease Why: Follow-up as scheduled. Contact information: [address] [place] [address] [phone]         Anner Alm ORN, MD. Schedule an appointment as soon as possible for a visit in 2 week(s).   Specialty: Cardiology Why: Follow-up in 1 to 2 weeks. Contact information: [address] [place] [address] [phone]                  The results of significant diagnostics from this hospitalization (including imaging, microbiology, ancillary and laboratory) are listed below for reference.    Significant Diagnostic Studies: CT HEAD WO CONTRAST ( ) Result Date: 03/06/2024 EXAM: CT HEAD WITHOUT CONTRAST 03/06/2024 06:25:00 PM TECHNIQUE: CT of the head was performed without the administration of intravenous contrast. Automated exposure control, iterative reconstruction, and/or weight based adjustment of the mA/kV was utilized to reduce the radiation dose to as low as reasonably achievable. COMPARISON: CT head without contrast 01/16/2018. CLINICAL HISTORY: Headache, classic migraine. FINDINGS: BRAIN AND VENTRICLES: No acute hemorrhage. No evidence of acute infarct. No hydrocephalus. No extra-axial collection. No mass effect or midline shift. ORBITS: No acute abnormality. SINUSES:  No acute abnormality. SOFT TISSUES AND SKULL: No acute soft tissue abnormality. No skull fracture. IMPRESSION: 1. No acute intracranial abnormality. Electronically signed by: Lonni Necessary MD 03/06/2024 06:36 PM EST RP Workstation: [id number]   ECHOCARDIOGRAM LIMITED Result Date: 03/04/2024    ECHOCARDIOGRAM LIMITED REPORT   Patient Name:   JENNYE RUNQUIST Date of Exam: 03/04/2024 Medical Rec #:  [id number]       Height:       66.0 in Accession #:    [id number]      Weight:       411.8 lb Date of Birth:  1985-04-29       BSA:          2.718 m Patient Age:    38 years        BP:           101/76 mmHg Patient Gender: F               HR:           106 bpm. Exam Location:  Inpatient Procedure: Limited Echo, Limited Color Doppler and Cardiac Doppler (Both            Spectral and Color Flow Doppler were utilized during procedure). Indications:    Pulmonary Embolus I26.09  History:        Patient has prior history of Echocardiogram examinations, most                 recent 02/05/2024. Pulmonary Hypertension,                 Signs/Symptoms:Shortness of Breath, Dyspnea and Edema; Risk                 Factors:Hypertension, Pre-diabetes and Sleep Apnea.  Sonographer:    Koleen Popper RDCS Referring Phys: [id number] [facility]  Sonographer Comments: Patient is obese. Image acquisition challenging due to patient body habitus. IMPRESSIONS  1. Left ventricular ejection fraction, by estimation, is 60 to 65%. The left ventricle has normal function. The left ventricle has no regional wall motion abnormalities. Left ventricular diastolic parameters were normal. There is the  interventricular septum is flattened in systole and diastole, consistent with right ventricular pressure and volume overload.  2. Right ventricular systolic function is severely reduced. The right ventricular size is severely enlarged. There is severely elevated pulmonary artery systolic pressure. The estimated right ventricular systolic pressure is 83.9  mmHg.  3. Right atrial size was severely dilated.  4. The mitral valve is normal in structure. Trivial mitral valve regurgitation.  5. Tricuspid valve regurgitation is mild to moderate.  6. The aortic valve is grossly normal. Aortic valve regurgitation is not visualized. No aortic stenosis is present.  7. Aortic dilatation noted. There is mild dilatation of the aortic root, measuring 43 mm.  8. The inferior vena cava is dilated in size with <50% respiratory variability, suggesting right atrial pressure of 15 mmHg. FINDINGS  Left Ventricle: Left ventricular ejection fraction, by estimation, is 60 to 65%. The left ventricle has normal function. The left ventricle has no regional wall motion abnormalities. The interventricular septum is flattened in systole and diastole, consistent with right ventricular pressure and volume overload. Left ventricular diastolic parameters were normal. Right Ventricle: The right ventricular size is severely enlarged. Right ventricular systolic function is severely reduced. There is severely elevated pulmonary artery systolic pressure. The tricuspid regurgitant velocity is 4.15 m/s, and with an assumed right atrial pressure of 15 mmHg, the estimated right ventricular systolic pressure is 83.9 mmHg. Left Atrium: Left atrial size was normal in size. Right Atrium: Right atrial size was severely dilated. Pericardium: There is no evidence of pericardial effusion. Mitral Valve: The mitral valve is normal in structure. Trivial mitral valve regurgitation. Tricuspid Valve: The tricuspid valve is normal in structure. Tricuspid valve regurgitation is mild to moderate. Aortic Valve: The aortic valve is grossly normal. Aortic valve regurgitation is not visualized. No aortic stenosis is present. Pulmonic Valve: The pulmonic valve was not well visualized. Aorta: Aortic dilatation noted. There is mild dilatation of the aortic root, measuring 43 mm. Venous: The inferior vena cava is dilated in size with  less than 50% respiratory variability, suggesting right atrial pressure of 15 mmHg. IAS/Shunts: There is left bowing of the interatrial septum, suggestive of elevated right atrial pressure. Additional Comments: Spectral Doppler performed. Color Doppler performed.  LEFT VENTRICLE PLAX 2D LVOT diam:     2.30 cm   Diastology LV SV:         64        LV e' medial:    4.57 cm/s LV SV Index:   24        LV E/e' medial:  17.7 LVOT Area:     4.15 cm  LV e' lateral:   8.05 cm/s                          LV E/e' lateral: 10.1  RIGHT VENTRICLE             IVC RV Basal diam:  6.40 cm     IVC diam: 3.20 cm RV Mid diam:    6.20 cm RV S prime:     12.10 cm/s TAPSE (M-mode): 1.6 cm LEFT ATRIUM           Index        RIGHT ATRIUM           Index LA Vol (A4C): 59.9 ml 22.04 ml/m  RA Area:     33.70 cm  RA Volume:   149.00 ml 54.81 ml/m  AORTIC VALVE LVOT Vmax:   96.80 cm/s LVOT Vmean:  64.700 cm/s LVOT VTI:    0.155 m  AORTA Ao Root diam: 4.30 cm Ao Asc diam:  3.60 cm MITRAL VALVE               TRICUSPID VALVE MV Area (PHT): 4.36 cm    TR Peak grad:   68.9 mmHg MV Decel Time: 174 msec    TR Vmax:        415.00 cm/s MV E velocity: 81.00 cm/s MV A velocity: 81.40 cm/s  SHUNTS MV E/A ratio:  1.00        Systemic VTI:  0.16 m                            Systemic Diam: 2.30 cm Toribio Fuel MD Electronically signed by Toribio Fuel MD Signature Date/Time: 03/04/2024/8:15:57 PM    Final    VAS US  LOWER EXTREMITY VENOUS (DVT) (ONLY [facility] & [facility]) Result Date: 03/04/2024  Lower Venous DVT Study Patient Name:  ALEAYAH CHICO  Date of Exam:   03/03/2024 Medical Rec #: [id number]        Accession #:    [id number] Date of Birth: 10/04/1985        Patient Gender: F Patient Age:   23 years Exam Location:  [facility] Procedure:      VAS US  LOWER EXTREMITY VENOUS (DVT) Referring Phys: RILEY RANSOM --------------------------------------------------------------------------------  Indications: Pain,  pulmonary embolism, and SOB.  Limitations: Body habitus and open wound. Comparison Study: No prior exam. Performing Technologist: Edilia Elden Appl  Examination Guidelines: A complete evaluation includes B-mode imaging, spectral Doppler, color Doppler, and power Doppler as needed of all accessible portions of each vessel. Bilateral testing is considered an integral part of a complete examination. Limited examinations for reoccurring indications may be performed as noted. The reflux portion of the exam is performed with the patient in reverse Trendelenburg.  +---------+---------------+---------+-----------+----------+-------------------+ RIGHT    CompressibilityPhasicitySpontaneityPropertiesThrombus Aging      +---------+---------------+---------+-----------+----------+-------------------+ CFV      Full           Yes      Yes                                      +---------+---------------+---------+-----------+----------+-------------------+ SFJ      Full           Yes      Yes                                      +---------+---------------+---------+-----------+----------+-------------------+ FV Prox  Full                                                             +---------+---------------+---------+-----------+----------+-------------------+ FV Mid   Full                                                             +---------+---------------+---------+-----------+----------+-------------------+  FV DistalFull                                                             +---------+---------------+---------+-----------+----------+-------------------+ PFV      Full                                                             +---------+---------------+---------+-----------+----------+-------------------+ POP      Full           Yes      Yes                                      +---------+---------------+---------+-----------+----------+-------------------+ PTV       Full                                                             +---------+---------------+---------+-----------+----------+-------------------+ PERO                                                  Not well visualized +---------+---------------+---------+-----------+----------+-------------------+ VV       None           No       No                                       +---------+---------------+---------+-----------+----------+-------------------+ Extensive thrombus noted in the varicosities of the right lower extremity from the distal thigh to distal calf. Superficial thrombophlebitis vs sclerotherapy.  +---------+---------------+---------+-----------+----------+--------------+ LEFT     CompressibilityPhasicitySpontaneityPropertiesThrombus Aging +---------+---------------+---------+-----------+----------+--------------+ CFV      Full           Yes      Yes                                 +---------+---------------+---------+-----------+----------+--------------+ SFJ      Full           Yes      Yes                                 +---------+---------------+---------+-----------+----------+--------------+ FV Prox  Full                                                        +---------+---------------+---------+-----------+----------+--------------+ FV Mid   Full                                                        +---------+---------------+---------+-----------+----------+--------------+  FV DistalFull                                                        +---------+---------------+---------+-----------+----------+--------------+ PFV      Full                                                        +---------+---------------+---------+-----------+----------+--------------+ POP      Full           Yes      Yes                                 +---------+---------------+---------+-----------+----------+--------------+ VV       None            No       No                                  +---------+---------------+---------+-----------+----------+--------------+ Extensive thrombus noted in the varicosities of the left lower extremity from the saphenofemoral junction area to the distal thigh. Superficial thrombophlebitis vs sclerotherapy. Patient declined having her left calf examined due to an open wound.    Summary: RIGHT: - Findings consistent with acute superficial vein thrombosis involving the right varicosities or other superficial veins.  - No cystic structure found in the popliteal fossa.  LEFT: - Findings consistent with acute superficial vein thrombosis involving the left varicosities or other superficial veins.  - No cystic structure found in the popliteal fossa.  *See table(s) above for measurements and observations. Electronically signed by Debby Robertson on 03/04/2024 at 8:13:54 AM.    Final    CT Angio Chest PE W and/or Wo Contrast Result Date: 03/02/2024 EXAM: CTA CHEST 03/02/2024 03:00:27 PM TECHNIQUE: CTA of the chest was performed after the administration of 80 mL of iohexol  (OMNIPAQUE ) 350 MG/ML injection. Multiplanar reformatted images are provided for review. MIP images are provided for review. Automated exposure control, iterative reconstruction, and/or weight based adjustment of the mA/kV was utilized to reduce the radiation dose to as low as reasonably achievable. COMPARISON: 12/09/2023 CLINICAL HISTORY: Shortness of breath and cardiac palpitations. FINDINGS: PULMONARY ARTERIES: Pulmonary arteries are adequately opacified for evaluation. Acute pulmonary embolus observed in subsegmental right upper lobe branches, in the medial segment right middle lobe, and subsegmental branches of the lateral segment right middle lobe; and in segmental and subsegmental branches of the right lower lobe pulmonary arterial tree. Left pulmonary artery acute pulmonary embolus extends in the upper lobe and lower lobe branches. Right  ventricular to left ventricular ratio is 1.26, abnormally elevated. Chronic substantial main pulmonary artery enlargement compatible with pulmonary arterial hypertension. The elevated right ventricular to left ventricular ratio was also present on 12/09/2023 despite the absence of pulmonary embolus on that exam. MEDIASTINUM: Right heart enlargement. Moderate chronic cardiomegaly is present. There is some reflux of contrast into the hepatic veins compatible with right heart strain. There is no acute abnormality of the thoracic aorta. LYMPH NODES: Scattered small and distinct mediastinal lymph nodes. No hilar or axillary lymphadenopathy.  LUNGS AND PLEURA: Nonspecific mosaic attenuation in the lungs although air trapping is favored over chronic pulmonary embolus. No focal consolidation or pulmonary edema. No evidence of pleural effusion or pneumothorax. UPPER ABDOMEN: Limited images of the upper abdomen are unremarkable. SOFT TISSUES AND BONES: No acute bone or soft tissue abnormality. IMPRESSION: 1. Acute pulmonary emboli involving right upper lobe, right middle lobe, right lower lobe, and left upper and lower lobe branches. 2. Right heart strain with elevated right ventricular to left ventricular ratio of 1.26 and right heart enlargement, with reflux of contrast into hepatic veins. Appearance concerning for at least submassive pulmonary embolus and right heart strain. 3. Chronic substantial main pulmonary artery enlargement compatible with pulmonary arterial hypertension. 4. Chronic cardiomegaly. 5. Nonspecific mosaic attenuation in the lungs, favoring air trapping over chronic pulmonary embolus. I discussed this critical value by telephone with provider Carlo Cornish at 03:17 pm on 03/02/2024. Electronically signed by: Ryan Salvage MD 03/02/2024 03:24 PM EST RP Workstation: [id number]   DG Chest 2 View Result Date: 03/02/2024 EXAM: 2 VIEW(S) XRAY OF THE CHEST 03/02/2024 01:45:31 PM COMPARISON: 12/09/2023  CLINICAL HISTORY: SOB FINDINGS: LUNGS AND PLEURA: No interstitial edema or airspace consolidation. No pleural fluid. No pneumothorax. HEART AND MEDIASTINUM: Cardiac enlargement, unchanged from previous exam. BONES AND SOFT TISSUES: No acute osseous abnormality. IMPRESSION: 1. No acute cardiopulmonary findings. Electronically signed by: Waddell Calk MD 03/02/2024 02:39 PM EST RP Workstation: [id number]    Microbiology: No results found for this or any previous visit (from the past 240 hours).   Labs: Basic Metabolic Panel: Recent Labs  Lab 03/03/24 0539 03/04/24 0529 03/05/24 0557 03/06/24 0516 03/07/24 0559 03/08/24 0559  NA 139 137 137 135 136 135  K 3.5 4.1 4.0 4.3 4.2 4.7  CL 98 98 100 99 100 101  CO2 29 28 26 26 27 28   GLUCOSE 143* 139* 134* 141* 134* 102*  BUN 16 14 20  25* 19 19  CREATININE 0.89 0.94 1.00 1.16* 0.86 0.90  CALCIUM 9.1 8.9 9.6 9.1 9.1 9.0  MG 2.3 2.1  --  2.0  --   --    Liver Function Tests: Recent Labs  Lab 03/05/24 0557  ALBUMIN 3.6   No results for input(s): LIPASE, AMYLASE in the last 168 hours. No results for input(s): AMMONIA in the last 168 hours. CBC: Recent Labs  Lab 03/04/24 0529 03/05/24 0557 03/06/24 0516 03/07/24 0559 03/08/24 0559  WBC 9.1 7.9 8.4 7.6 6.9  HGB 13.7 13.7 13.3 13.0 13.1  HCT 44.7 45.4 44.2 42.3 43.3  MCV 95.5 95.4 95.3 94.4 95.2  PLT 238 253 270 286 280   Cardiac Enzymes: No results for input(s): CKTOTAL, CKMB, CKMBINDEX, TROPONINI in the last 168 hours. BNP: BNP (last 3 results) Recent Labs    08/31/23 2013 12/09/23 2031  BNP 632.1* 199.8*    ProBNP (last 3 results) Recent Labs    03/02/24 1330  PROBNP 3,470.0*    CBG: No results for input(s): GLUCAP in the last 168 hours.     Signed:  Toribio Hummer MD.  [facility] 03/08/2024, 9:31 AM

## 2024-03-08 NOTE — Progress Notes (Signed)
 PT Cancellation Note  Patient Details Name: ASMAA TIRPAK MRN: [id number] DOB: 07-06-1985   Cancelled Treatment:    Reason Eval/Treat Not Completed: (P) Other (comment);Patient declined, no reason specified (Pt denies need for PT at this time.  She has been independent in room for mobility needs.  Will defer PT at this time.)   Labradford Schnitker J Mahmud Keithly 03/08/2024, 11:48 AM  Toya [name] , PTA Acute Rehabilitation Services Office [phone]

## 2024-03-09 ENCOUNTER — Encounter: Payer: Self-pay | Admitting: *Deleted

## 2024-03-11 ENCOUNTER — Ambulatory Visit: Admitting: Emergency Medicine

## 2024-03-11 NOTE — Progress Notes (Deleted)
 Cardiology Office Note:    Date:  03/11/2024  ID:  [name], DOB 1985/05/29, MRN [id number] PCP: Alba Sharper, MD  [facility] Providers Cardiologist:  Alm Clay, MD { Click to update primary MD,subspecialty MD or APP then REFRESH:1}    {Click to Open Review  :1}   Patient Profile:       Chief Complaint: *** History of Present Illness:  [name] is a 38 y.o. female with visit-pertinent history of ***  She was admitted 5/17 through 09/03/2023 for symptoms of progressively worsening dyspnea and bilateral lower extremity edema.  She had a PE protocol CT which was negative for PE however there is evidence of volume overload and she was started on IV Lasix .  Echocardiogram showed normal EF with grade 1 DD.  She was diuresed close to 10 L having lost 36 pounds.  She was discharged on Lasix  80 mg daily along with potassium.  Cardiology was not consulted.  She followed up with her PCP on 5/29 and she was referred to cardiology for right heart failure.  She establish care with Dr. Clay on 12/30/2023.  There was concern for OSA and OHS causing right-sided heart failure.  She had an echocardiogram on 02/05/2024 with LVEF 55 to 60%, no RWMA, mild LVH, normal diastolic parameters, interventricular septum is flattened in systole consistent with right ventricular pressure overload, RV function mildly reduced, RV size severely enlarged, severely elevated pulmonary artery systolic pressure, moderate tricuspid regurgitation, mild dilation of aortic root measuring 43 mm.  She was admitted to the hospital from 03/02/2024 through 03/08/2024.  She presented to the ED for shortness of breath and a CTA was done which showed bilateral PEs with right heart strain.  The pulmonologist were consulted but felt that she does not need thrombolytics as her right heart strain may not all be due to PE but possibly from her pulmonary hypertension due to chronic right heart failure and likely  obstructive sleep apnea.  PE was thought to be likely secondary to recent initiation of OCP as symptoms coincided with initiation of hormone pills for menstrual cycle regulation.  Lower extremity Dopplers negative for DVT.  2D echo with LVEF 60 to 65%, no RWMA, interventricular septum flattened in systole and diastole consistent with right ventricular pressure and volume overload.  Severely reduced right ventricular systolic function, severely enlarged right ventricular size, severely elevated PASP, trivial MVR, mild to moderate TVR.  Patient's diuretics will be resumed on discharge at half her home dose will be discharged on 40 mg daily as she had soft blood pressures during admission.  Discussed the use of AI scribe software for clinical note transcription with the patient, who gave verbal consent to proceed.  History of Present Illness     Review of systems:  Please see the history of present illness. All other systems are reviewed and otherwise negative. ***      Studies Reviewed:        ***  Risk Assessment/Calculations:   {Does this patient have ATRIAL FIBRILLATION?:[phone]} No BP recorded.  {Refresh Note OR Click here to enter BP  :1}***   STOP-Bang Score:  4  { Consider Dx Sleep Disordered Breathing or Sleep Apnea  ICD G47.33          :1}     Physical Exam:   VS:  LMP 02/14/2024    Wt Readings from Last 3 Encounters:  03/02/24 (!) 411 lb 13.1 oz (186.8 kg)  01/10/24 (!) 411 lb 12.8  oz (186.8 kg)  12/30/23 (!) 422 lb (191.4 kg)    GEN: Well nourished, well developed in no acute distress NECK: No JVD; No carotid bruits CARDIAC: ***RRR, no murmurs, rubs, gallops RESPIRATORY:  Clear to auscultation without rales, wheezing or rhonchi  ABDOMEN: Soft, non-tender, non-distended EXTREMITIES:  No edema; No acute deformity ***      Assessment and Plan:    Assessment and Plan Assessment & Plan      {Are you ordering a CV Procedure (e.g. stress test, cath, DCCV, TEE,  etc)?   Press F2        :[id number]}  Dispo:  No follow-ups on file.  Signed, Lum LITTIE Louis, NP

## 2024-03-16 ENCOUNTER — Ambulatory Visit: Admitting: Family Medicine

## 2024-03-16 NOTE — Progress Notes (Deleted)
    SUBJECTIVE:   CHIEF COMPLAINT / HPI: hospital f/u  Discussed the use of AI scribe software for clinical note transcription with the patient, who gave verbal consent to proceed.  History of Present Illness   Recommendations for Outpatient Follow-up:  Follow-up with Alba Sharper, MD in 2 weeks.  On follow-up patient will need ambulatory sats done to see if patient needs ongoing oxygen.  Patient will need follow-up on PE.  Patient will need a basic metabolic profile done to follow-up on electrolytes and renal function.  Patient will need a CBC done to follow-up on counts.  Patient advised on discontinuation of hormone pills.  Patient will need follow-up on her headaches. Follow-up with Dr. Anner, cardiology in 1 to 2 weeks.  Patient is diuretics with decreased on discharge during the hospitalization due to soft blood pressure.  Patient will need further evaluation and management of cardiac medications. Follow-up with Dr. Neda as scheduled.  Patient will need follow-up on pulmonary embolism and further recommendations.  Patient also to follow-up for evaluation for OSA.  PERTINENT  PMH / PSH: Chronic Right Sided Heart Failure, Recent Pulmonary Embolism, HTN, Obesity  OBJECTIVE:   LMP 02/14/2024   Physical Exam    ASSESSMENT/PLAN:   Assessment & Plan  Assessment and Plan Assessment & Plan      No follow-ups on file.  Sharper Alba, MD, PGY-3 [facility] 11:46 AM 03/16/2024  [facility]

## 2024-03-17 ENCOUNTER — Ambulatory Visit: Admitting: Family Medicine

## 2024-03-17 NOTE — Progress Notes (Deleted)
    SUBJECTIVE:   CHIEF COMPLAINT / HPI: hospital f/u  Admitted 11/17-11/23 for acute PE, HF  Recommendations for Outpatient Follow-up:  Follow-up with Alba Sharper, MD in 2 weeks.  On follow-up patient will need ambulatory sats done to see if patient needs ongoing oxygen.  Patient will need follow-up on PE.  Patient will need a basic metabolic profile done to follow-up on electrolytes and renal function.  Patient will need a CBC done to follow-up on counts.  Patient advised on discontinuation of hormone pills.  Patient will need follow-up on her headaches. Follow-up with Dr. Anner, cardiology in 1 to 2 weeks.  Patient is diuretics with decreased on discharge during the hospitalization due to soft blood pressure.  Patient will need further evaluation and management of cardiac medications. Follow-up with Dr. Neda as scheduled.  Patient will need follow-up on pulmonary embolism and further recommendations.  Patient also to follow-up for evaluation for OSA.  PERTINENT  PMH / PSH: Chronic Right Sided Heart Failure, Recent Pulmonary Embolism, HTN, Obesity  OBJECTIVE:   LMP 02/14/2024    ASSESSMENT/PLAN:   Assessment & Plan    No follow-ups on file.  Elyce Prescott DO [facility]

## 2024-03-24 ENCOUNTER — Emergency Department (HOSPITAL_COMMUNITY)

## 2024-03-24 ENCOUNTER — Other Ambulatory Visit: Payer: Self-pay

## 2024-03-24 ENCOUNTER — Emergency Department (HOSPITAL_COMMUNITY)
Admission: EM | Admit: 2024-03-24 | Discharge: 2024-03-24 | Disposition: A | Discharge status: Home / Self Care | Attending: Emergency Medicine | Admitting: Emergency Medicine

## 2024-03-24 DIAGNOSIS — M25562 Pain in left knee: Secondary | ICD-10-CM

## 2024-03-24 MED ORDER — PREDNISONE 10 MG (21) PO TBPK: ORAL_TABLET | Freq: Every day | ORAL | 0 refills | Status: AC

## 2024-03-24 MED ORDER — METHOCARBAMOL 500 MG PO TABS: 500.0000 mg | ORAL_TABLET | Freq: Two times a day (BID) | ORAL | 0 refills | Status: AC

## 2024-03-24 NOTE — ED Triage Notes (Signed)
 PT arrives to triage via wheelchair with complaints of LEFT knee pain that began intermittently, but now has been constant. Pt states that she was recently dx with a PE less than a month ago. PT takes Eliquis  2x daily.

## 2024-03-24 NOTE — ED Provider Notes (Signed)
 [place] EMERGENCY DEPARTMENT AT [facility] Provider Note   CSN: [id number] Arrival date & time: 03/24/24  1711     Patient presents with: Knee Pain   [name] is a 38 y.o. female.   38 year old female presents with atraumatic left knee pain times several days.  States the pain is localized to the lateral portion of her left knee.  Denies any hip or ankle or foot pain.  Does have a recent history of left lower extremity DVT and PE.  She is on Eliquis  and has been compliant.  She denies any chest pain or shortness of breath.  No recent fevers.  No history of gout.  No treatment use prior to arrival       Prior to Admission medications   Medication Sig Start Date End Date Taking? Authorizing Provider  albuterol  (VENTOLIN  HFA) 108 (90 Base) MCG/ACT inhaler Inhale 2 puffs into the lungs every 6 (six) hours as needed for wheezing or shortness of breath. 10/21/23   Delores Suzann HERO, MD  APIXABAN  (ELIQUIS ) VTE STARTER PACK (10MG  AND 5MG ) Take as directed on package: start with two-5mg  tablets twice daily for 7 days. On day 8, switch to one-5mg  tablet twice daily. 03/07/24   Sebastian Toribio GAILS, MD  Cholecalciferol  ([employer] D3) 125 MCG (5000 UT) capsule TAKE 1 TABLET BY MOUTH ONCE A WEEK. FOR 8 WEEKS Patient taking differently: Take 5,000 Units by mouth every 7 (seven) days. 11/25/23   Alba Sharper, MD  furosemide  (LASIX ) 40 MG tablet Take 1 tablet (40 mg total) by mouth daily. May take an additional 40 mg if weight is greater 3 lbs or more 03/09/24   Sebastian Toribio GAILS, MD  methocarbamol  (ROBAXIN ) 500 MG tablet Take 500 mg by mouth every 6 (six) hours as needed for muscle spasms.    [provider]  potassium chloride  (KLOR-CON  M) 10 MEQ tablet Take 1 tablet (10 mEq total) by mouth daily. 03/09/24   Sebastian Toribio GAILS, MD  TYLENOL  8 HOUR ARTHRITIS PAIN 650 MG CR tablet Take 650-1,300 mg by mouth every 8 (eight) hours as needed for pain.    [provider]     Allergies: Tramadol     Review of Systems  All other systems reviewed and are negative.   Updated Vital Signs BP (!) 147/80 (BP Location: Right Arm)   Pulse (!) 103   Temp 97.8 F (36.6 C) (Oral)   Resp 18   LMP 03/09/2024 (Approximate)   SpO2 93%   Physical Exam Vitals and nursing note reviewed.  Constitutional:      General: She is not in acute distress.    Appearance: Normal appearance. She is well-developed. She is not toxic-appearing.  HENT:     Head: Normocephalic and atraumatic.  Eyes:     General: Lids are normal.     Conjunctiva/sclera: Conjunctivae normal.     Pupils: Pupils are equal, round, and reactive to light.  Neck:     Thyroid : No thyroid  mass.     Trachea: No tracheal deviation.  Cardiovascular:     Rate and Rhythm: Normal rate and regular rhythm.     Heart sounds: Normal heart sounds. No murmur heard.    No gallop.  Pulmonary:     Effort: Pulmonary effort is normal. No respiratory distress.     Breath sounds: Normal breath sounds. No stridor. No decreased breath sounds, wheezing, rhonchi or rales.  Abdominal:     General: There is no distension.  Palpations: Abdomen is soft.     Tenderness: There is no abdominal tenderness. There is no rebound.  Musculoskeletal:        General: No tenderness.     Cervical back: Normal range of motion and neck supple.     Left knee: No deformity, effusion or bony tenderness. Decreased range of motion.       Legs:  Skin:    General: Skin is warm and dry.     Findings: No abrasion or rash.  Neurological:     Mental Status: She is alert and oriented to person, place, and time. Mental status is at baseline.     GCS: GCS eye subscore is 4. GCS verbal subscore is 5. GCS motor subscore is 6.     Cranial Nerves: No cranial nerve deficit.     Sensory: No sensory deficit.     Motor: Motor function is intact.  Psychiatric:        Attention and Perception: Attention normal.        Speech: Speech normal.         Behavior: Behavior normal.     (all labs ordered are listed, but only abnormal results are displayed) Labs Reviewed - No data to display  EKG: None  Radiology: No results found.   Procedures   Medications Ordered in the ED - No data to display                                  Medical Decision Making Amount and/or Complexity of Data Reviewed Radiology: ordered.   Left knee x-ray negative.  Suspect ligamentous etiology of her pain.  Will prescribe course of steroids and muscle relaxants     Final diagnoses:  None    ED Discharge Orders     None          Dasie Faden, MD 03/24/24 1952

## 2024-03-25 ENCOUNTER — Encounter: Payer: Self-pay | Admitting: Pulmonary Disease

## 2024-03-25 ENCOUNTER — Ambulatory Visit: Admitting: Pulmonary Disease

## 2024-03-25 VITALS — BP 127/84 | HR 103 | Temp 98.6°F | Ht 66.0 in | Wt >= 6400 oz

## 2024-03-25 DIAGNOSIS — I2609 Other pulmonary embolism with acute cor pulmonale: Secondary | ICD-10-CM

## 2024-03-25 DIAGNOSIS — I272 Pulmonary hypertension, unspecified: Secondary | ICD-10-CM

## 2024-03-25 NOTE — Patient Instructions (Signed)
 Continue on your blood thinners  We will schedule you for an in-lab split-night study  Continue weight loss efforts  Call us  with significant concerns  Tentative follow-up in about 3 months

## 2024-03-25 NOTE — Progress Notes (Signed)
 [name]    [id number]    [birth year]  Primary Care Physician:Quillen, Ozell, MD  Referring Physician: Anner Alm ORN, MD [address] [place],  [place] [address]  Chief complaint:   Patient being seen for concern for obstructive sleep apnea Recently hospitalized for acute pulmonary embolism  Discussed the use of AI scribe software for clinical note transcription with the patient, who gave verbal consent to proceed.  History of Present Illness [name] is a 38 year old female who presents with concerns about sleep apnea and a recent blood clot.  She has experienced disrupted sleep since her hospitalization, typically attempting to sleep from 10 PM to 7 AM, but now often going to bed at 1 AM. She feels exhausted upon waking, with morning headaches, night sweats, occasional dry mouth, and forgetfulness. Her weight is stable, and she uses diuretics for fluid retention. Her family history includes sleep apnea in her brother and mother.  She recently had a blood clot, which she attributes to medication. During her hospitalization, she experienced shortness of breath and chest discomfort. She is currently on Eliquis . The initial pain began in the back of her calf and resolved with medication, but she now has side pain for which she was given prednisone .  She has a persistent cough and has taken medication for acid reflux. The cough is continuous and unrelated to a cold. She also experiences neck pain, which she associates with her recent hospitalization.  She does not smoke cigarettes but has a history of smoking marijuana, which she has since stopped. No significant weight changes.   No pets No pertinent occupational history  Has a history of chronic diastolic heart failure History of intractable headaches   Outpatient Encounter Medications as of 03/25/2024  Medication Sig   albuterol  (VENTOLIN  HFA) 108 (90 Base) MCG/ACT inhaler Inhale 2 puffs into the  lungs every 6 (six) hours as needed for wheezing or shortness of breath.   APIXABAN  (ELIQUIS ) VTE STARTER PACK (10MG  AND 5MG ) Take as directed on package: start with two-5mg  tablets twice daily for 7 days. On day 8, switch to one-5mg  tablet twice daily.   Cholecalciferol  ([facility] D3) 125 MCG (5000 UT) capsule TAKE 1 TABLET BY MOUTH ONCE A WEEK. FOR 8 WEEKS   furosemide  (LASIX ) 40 MG tablet Take 1 tablet (40 mg total) by mouth daily. May take an additional 40 mg if weight is greater 3 lbs or more   methocarbamol  (ROBAXIN ) 500 MG tablet Take 1 tablet (500 mg total) by mouth 2 (two) times daily.   potassium chloride  (KLOR-CON  M) 10 MEQ tablet Take 1 tablet (10 mEq total) by mouth daily.   predniSONE  (STERAPRED UNI-PAK 21 TAB) 10 MG (21) TBPK tablet Take by mouth daily. Take 6 tabs by mouth daily  for 2 days, then 5 tabs for 2 days, then 4 tabs for 2 days, then 3 tabs for 2 days, 2 tabs for 2 days, then 1 tab by mouth daily for 2 days   TYLENOL  8 HOUR ARTHRITIS PAIN 650 MG CR tablet Take 650-1,300 mg by mouth every 8 (eight) hours as needed for pain.   [DISCONTINUED] methocarbamol  (ROBAXIN ) 500 MG tablet Take 500 mg by mouth every 6 (six) hours as needed for muscle spasms.   No facility-administered encounter medications on file as of 03/25/2024.    Allergies as of 03/25/2024 - Review Complete 03/25/2024  Allergen Reaction Noted   Tramadol  Other (See Comments) 10/05/2015  Past Medical History:  Diagnosis Date   Ankle ulcer (HCC)    right   Birth control counseling 01/25/2014   Childhood asthma    Chronic lower back pain    Encounter for counseling regarding contraception 05/03/2023   GERD (gastroesophageal reflux disease)    Headache    only when I'm hungry (10/14/2015)   History of COVID-19 12/22/2019   History of iritis 10/08/2013   Hypertension    Obesity    Pap smear for cervical cancer screening 01/06/2018   Varicose veins    VSD (ventricular septal defect)    had hole in bottom  of her heart; closed up on it's own; no OR /mom 10/14/2015)    Past Surgical History:  Procedure Laterality Date   HERNIA REPAIR     INSERTION OF MESH N/A 10/14/2015   Procedure: INSERTION OF MESH;  Surgeon: Krystal Russell, MD;  Location: [facility] OR;  Service: General;  Laterality: N/A;   LAPAROSCOPIC INCISIONAL / UMBILICAL / VENTRAL HERNIA REPAIR  10/14/2015   VHR w/mesh   TONSILLECTOMY AND ADENOIDECTOMY  1990s   TYMPANOSTOMY TUBE PLACEMENT Bilateral 1990s   VEIN LIGATION AND STRIPPING Right 12/15/2014   Procedure: SAPHENOUS VEIN LIGATION AND STRIPPING; GREATER THAN 20 STAB PHLEBECTOMIES;  Surgeon: Krystal JULIANNA Doing, MD;  Location: [facility] OR;  Service: Vascular;  Laterality: Right;   VENTRAL HERNIA REPAIR N/A 10/14/2015   Procedure: LAPAROSCOPIC VENTRAL HERNIA;  Surgeon: Krystal Russell, MD;  Location: [facility] OR;  Service: General;  Laterality: N/A;    Family History  Problem Relation Age of Onset   Diabetes Mother    Varicose Veins Mother    Hypertension Mother    Hyperlipidemia Mother    Healthy Father    Varicose Veins Sister    Cancer Sister        unsure of type but requiring hysterectomy    Social History   Socioeconomic History   Marital status: Single    Spouse name: Not on file   Number of children: Not on file   Years of education: Not on file   Highest education level: Not on file  Occupational History   Not on file  Tobacco Use   Smoking status: Never   Smokeless tobacco: Never   Tobacco comments:    Quit smoking weed 6 months ago  Vaping Use   Vaping status: Never Used  Substance and Sexual Activity   Alcohol use: Yes    Comment:  socially   Drug use: Yes    Types: Marijuana   Sexual activity: Yes    Birth control/protection: None  Other Topics Concern   Not on file  Social History Narrative   Not on file   Social Drivers of Health   Financial Resource Strain: Not on file  Food Insecurity: No Food Insecurity (03/02/2024)   Hunger Vital Sign    Worried About  Running Out of Food in the Last Year: Never true    Ran Out of Food in the Last Year: Never true  Transportation Needs: No Transportation Needs (03/02/2024)   PRAPARE - Administrator, Civil Service (Medical): No    Lack of Transportation (Non-Medical): No  Physical Activity: Not on file  Stress: Not on file  Social Connections: Socially Integrated (09/01/2023)   Social Connection and Isolation Panel    Frequency of Communication with Friends and Family: More than three times a week    Frequency of Social Gatherings with Friends and Family: More than three times  a week    Attends Religious Services: 1 to 4 times per year    Active Member of Clubs or Organizations: Yes    Attends Banker Meetings: 1 to 4 times per year    Marital Status: Living with partner  Intimate Partner Violence: Not At Risk (03/02/2024)   Humiliation, Afraid, Rape, and Kick questionnaire    Fear of Current or Ex-Partner: No    Emotionally Abused: No    Physically Abused: No    Sexually Abused: No    Review of Systems  Respiratory:  Positive for shortness of breath.   Psychiatric/Behavioral:  Positive for sleep disturbance.     Vitals:   03/25/24 1449  BP: 127/84  Pulse: (!) 103  Temp: 98.6 F (37 C)  SpO2: 92%     Physical Exam Constitutional:      Appearance: She is obese.  HENT:     Head: Normocephalic.     Nose: Nose normal.     Mouth/Throat:     Mouth: Mucous membranes are moist.  Eyes:     General: No scleral icterus.    Pupils: Pupils are equal, round, and reactive to light.  Cardiovascular:     Rate and Rhythm: Normal rate and regular rhythm.  Pulmonary:     Effort: No respiratory distress.     Breath sounds: No stridor. No wheezing or rhonchi.  Musculoskeletal:     Cervical back: No rigidity or tenderness.  Neurological:     Mental Status: She is alert.  Psychiatric:        Mood and Affect: Mood normal.    Data Reviewed: Discharge summary 03/08/2024  was reviewed by myself  Echocardiogram 03/04/2024 with severe pulmonary hypertension She had had an echocardiogram 02/05/2024 already showing severe pulmonary hypertension   Assessment and Plan Assessment & Plan Obstructive sleep apnea (suspected) Suspected obstructive sleep apnea due to symptoms of non-restorative sleep, daytime fatigue, and family history. Explained pathophysiology, including airway collapse during sleep and its impact on sleep quality. Discussed CPAP as the most common treatment, with 70-80% of patients tolerating it well. Alternative treatments include weight loss, surgical options, and the Inspire device for those intolerant to CPAP. - Ordered in-lab sleep study to confirm diagnosis and assess severity. - Will discuss CPAP therapy based on sleep study results.  Pulmonary embolism with right heart strain Pulmonary embolism with associated right heart strain. Explained that blood thinners are necessary to prevent further clot formation. Discussed that most clots resolve within a month, but some may persist. Emphasized the importance of continuing blood thinners due to the stress on the heart and the risk of another clot. Explained that the duration of anticoagulation therapy will depend on the results of a future echocardiogram to assess heart function. - Continue Eliquis  for at least three months, with potential extension based on echocardiogram results. - Will schedule echocardiogram in three months to assess heart function.  Deep vein thrombosis of lower extremity (history of) History of deep vein thrombosis in the lower extremity. Explained that blood thinners are necessary to prevent recurrence. Discussed that symptoms may fluctuate but should improve with continued anticoagulation therapy. - Continue Eliquis  as prescribed for pulmonary embolism.  Gastroesophageal reflux disease with chronic cough Chronic cough likely related to gastroesophageal reflux disease.  Explained that reflux can cause irritation of the voice box, leading to cough. Discussed management strategies, including lifestyle modifications to reduce reflux symptoms. - Ensure stomach is empty three hours before bedtime. - Elevate head of  bed by 30 degrees to reduce reflux symptoms.  Severe pulmonary hypertension - Has an appointment to follow-up with cardiology  Class III obesity - Weight loss efforts encouraged  Obesity hypoventilation - This will require again an in lab study for the diagnosis and management of sleep disordered breathing  She has multiple comorbidities that places her at increased risk of heart failure It is important to diagnose and treat her sleep disordered breathing  She will follow-up with cardiology as well  I personally spent a total of 62 minutes in the care of the patient today including preparing to see the patient, getting/reviewing separately obtained history, performing a medically appropriate exam/evaluation, counseling and educating, placing orders, documenting clinical information in the EHR, and independently interpreting results.  Jennet Epley MD [facility] 03/25/2024, 4:52 PM  CC: Anner Alm ORN, MD

## 2024-04-03 ENCOUNTER — Other Ambulatory Visit: Payer: Self-pay

## 2024-04-03 ENCOUNTER — Other Ambulatory Visit (HOSPITAL_COMMUNITY): Payer: Self-pay

## 2024-04-03 DIAGNOSIS — I2609 Other pulmonary embolism with acute cor pulmonale: Secondary | ICD-10-CM

## 2024-04-03 DIAGNOSIS — J984 Other disorders of lung: Secondary | ICD-10-CM

## 2024-04-03 MED ORDER — APIXABAN 5 MG PO TABS
5.0000 mg | ORAL_TABLET | Freq: Two times a day (BID) | ORAL | 3 refills | Status: DC
  Filled 2024-04-03: qty 60, 30d supply, fill #0

## 2024-04-03 NOTE — Telephone Encounter (Signed)
 Called patient to confirm that she does not require an initial VTE Eliquis  starter pack.  She she does need to continue on the 5 mg twice daily dosing of Eliquis .  Patient confirmed this.  Recent Eliquis  5 mg twice daily to her pharmacy.  Discussed that she did not have hospital follow-up with our clinic.  Patient open to scheduling for 12/22 with this provider.

## 2024-04-06 ENCOUNTER — Ambulatory Visit: Payer: Self-pay | Admitting: Family Medicine

## 2024-04-06 ENCOUNTER — Encounter: Payer: Self-pay | Admitting: Family Medicine

## 2024-04-06 DIAGNOSIS — I2609 Other pulmonary embolism with acute cor pulmonale: Secondary | ICD-10-CM

## 2024-04-06 NOTE — Progress Notes (Deleted)
" ° ° °  SUBJECTIVE:   CHIEF COMPLAINT / HPI: check-up  Discussed the use of AI scribe software for clinical note transcription with the patient, who gave verbal consent to proceed.  History of Present Illness   Per discharge summary 03/02/2024 Recommendations for Outpatient Follow-up:  Follow-up with Alba Sharper, MD in 2 weeks.  On follow-up patient will need ambulatory sats done to see if patient needs ongoing oxygen.  Patient will need follow-up on PE.  Patient will need a basic metabolic profile done to follow-up on electrolytes and renal function.  Patient will need a CBC done to follow-up on counts.  Patient advised on discontinuation of hormone pills.  Patient will need follow-up on her headaches. Follow-up with Dr. Anner, cardiology in 1 to 2 weeks.  Patient is diuretics with decreased on discharge during the hospitalization due to soft blood pressure.  Patient will need further evaluation and management of cardiac medications. Follow-up with Dr. Neda as scheduled.  Patient will need follow-up on pulmonary embolism and further recommendations.  Patient also to follow-up for evaluation for OSA.  PERTINENT  PMH / PSH: Pretension, pulmonary embolism, CHF, pulmonary hypertension, right-sided heart failure, restrictive lung disease  OBJECTIVE:   LMP 03/09/2024 (Approximate)   Physical Exam    ASSESSMENT/PLAN:   Assessment & Plan  Assessment and Plan Assessment & Plan      No follow-ups on file.  Sharper Alba, MD, PGY-3 [facility] 8:11 AM 04/06/2024  [facility]   "

## 2024-04-07 ENCOUNTER — Other Ambulatory Visit (HOSPITAL_COMMUNITY): Payer: Self-pay

## 2024-04-07 MED ORDER — APIXABAN 5 MG PO TABS: 5.0000 mg | ORAL_TABLET | Freq: Two times a day (BID) | ORAL | 3 refills | Status: AC

## 2024-04-07 NOTE — Telephone Encounter (Signed)
 Called and cancelled rx at [facility].   Resent to [facility] [place] [facility].   [name] JAYSON English, RN

## 2024-04-13 ENCOUNTER — Ambulatory Visit: Payer: Self-pay | Admitting: Family Medicine

## 2024-04-13 ENCOUNTER — Ambulatory Visit (INDEPENDENT_AMBULATORY_CARE_PROVIDER_SITE_OTHER): Admitting: Family Medicine

## 2024-04-13 ENCOUNTER — Ambulatory Visit: Admitting: Family Medicine

## 2024-04-13 VITALS — BP 120/92 | HR 96 | Ht 66.0 in | Wt >= 6400 oz

## 2024-04-13 DIAGNOSIS — R7303 Prediabetes: Secondary | ICD-10-CM

## 2024-04-13 DIAGNOSIS — I5032 Chronic diastolic (congestive) heart failure: Secondary | ICD-10-CM

## 2024-04-13 DIAGNOSIS — Z3009 Encounter for other general counseling and advice on contraception: Secondary | ICD-10-CM | POA: Diagnosis not present

## 2024-04-13 DIAGNOSIS — R42 Dizziness and giddiness: Secondary | ICD-10-CM | POA: Diagnosis not present

## 2024-04-13 DIAGNOSIS — N921 Excessive and frequent menstruation with irregular cycle: Secondary | ICD-10-CM | POA: Diagnosis not present

## 2024-04-13 DIAGNOSIS — M25569 Pain in unspecified knee: Secondary | ICD-10-CM

## 2024-04-13 LAB — POCT GLYCOSYLATED HEMOGLOBIN (HGB A1C): HbA1c, POC (controlled diabetic range): 6.7 % (ref 0.0–7.0)

## 2024-04-13 MED ORDER — OXYCODONE HCL 5 MG PO TABS: 2.5000 mg | ORAL_TABLET | Freq: Four times a day (QID) | ORAL | 0 refills | Status: DC | PRN

## 2024-04-13 NOTE — Assessment & Plan Note (Signed)
 Discussed with patient over the phone her use of oxycodone  without contraception. I have advised for her to take extra precaution while on this medication and off birth control. She understands. She will return in 2-4 weeks to further discuss this and above issues.

## 2024-04-13 NOTE — Patient Instructions (Signed)
 I sent in oxycodone  for you.  I have referred you to sports medicine.   Be sure to go to PT.  Let me know if your pain is worsening before getting in with the specialists.

## 2024-04-13 NOTE — Assessment & Plan Note (Addendum)
 Repeat BMP while on diuresis.

## 2024-04-13 NOTE — Progress Notes (Cosign Needed Addendum)
 "  SUBJECTIVE:   CHIEF COMPLAINT / HPI:  Discussed the use of AI scribe software for clinical note transcription with the patient, who gave verbal consent to proceed.  History of Present Illness [name] is a 38 year old female with arthritis who presents with knee pain and dizziness.  Knee pain and swelling - Chronic knee pain due to mild arthritis, significantly limiting ambulation - Severe pain with associated swelling of the knee and ankle - Compression stockings worn for leg swelling - Uses topical patch and Tylenol  for pain, with only temporary relief - Prior x-ray showed no fracture or structural joint damage - Previous treatment with a steroid provided brief improvement  Dizziness - Severe dizziness began after starting methocarbamol , described as room spinning and difficulty getting out of bed - Dizziness has improved but persists with positional changes such as standing or turning head quickly - Concerned dizziness may be related to medication or another underlying problem  History of venous thromboembolism and anticoagulation - History of blood clots in October associated with hormone therapy - Hospitalized for pulmonary complications related to blood clots - Hormone therapy discontinued - Currently taking Eliquis  twice daily without missed doses  Edema and diuretic use - Wears compression stockings for leg swelling - Takes furosemide , which increases urination  Glycemic status - Borderline diabetes - Concerned that diabetes could be contributing to dizziness   OBJECTIVE:  BP (!) 120/92   Pulse 96   Ht 5' 6 (1.676 m)   Wt (!) 406 lb 6.4 oz (184.3 kg)   LMP 03/09/2024 (Approximate)   SpO2 96%   BMI 65.59 kg/m   Physical Exam GENERAL: Alert, cooperative, well developed, no acute distress. MUSCULOSKELETAL: R knee full ROM. Difficult to appreciate edema given habitus. No significant erythema or warmth of the joint. TTP over medial joint line. Right knee  with mild crepitus on movement. Negative anterior drawer, thessaly, and valgus/varus pain testing.  NEUROLOGICAL: Cranial nerves grossly intact, moves all other extremities without gross motor or sensory deficit, gait normal  ASSESSMENT/PLAN:   Assessment & Plan Knee pain, unspecified chronicity, unspecified laterality Ssevere pain affecting mobility. No evidence of ligamentous/meniscal pathology on my exam today. Previous treatments provided temporary relief. Methocarbamol  caused dizziness. Meloxicam contraindicated due to eliquis  use. Physical therapy interrupted. BMI contributing. - Referred to sports medicine for possible left knee steroid injection under ultrasound guidance. - Encouraged resumption of physical therapy. - Will order limited supply of oxycodone  until she sees SM; she is aware this can cause drowsiness and was instructed to use at night before bed first to see how it affects her - Return in 2-4 weeks or sooner if needed Dizziness Dizziness and vertigo following methocarbamol  use, overall improving since stopping this medication. Symptoms improved but persist at times with positional changes. No gross neurological deficits today. - Advised discontinuation of methocarbamol  due to adverse effects. - Continue to stay hydrated and target urine color of light yellow. - Monitor dizziness symptoms and report if these worsens. Prediabetes Repeat A1c today 6.7. I let her know this is in the diabetes range. When she returns in 2-4 weeks, we will repeat this and discuss next steps if second value confirms diabetes. Chronic diastolic CHF (congestive heart failure) (HCC) Repeat BMP while on diuresis. Menorrhagia with irregular cycle Repeat CBC today given prior lab not collected. Remains off OCPs. Has to continue Eliquis  for clot though no report of significant vaginal bleeding today. General counseling and advice on contraceptive management Discussed with patient  over the phone her use  of oxycodone  without contraception. I have advised for her to take extra precaution while on this medication and off birth control. She understands. She will return in 2-4 weeks to further discuss this and above issues.  Stuart Redo, MD [facility]  "

## 2024-04-13 NOTE — Assessment & Plan Note (Addendum)
 Repeat CBC today given prior lab not collected. Remains off OCPs. Has to continue Eliquis  for clot though no report of significant vaginal bleeding today.

## 2024-04-13 NOTE — Assessment & Plan Note (Addendum)
 Repeat A1c today 6.7. I let her know this is in the diabetes range. When she returns in 2-4 weeks, we will repeat this and discuss next steps if second value confirms diabetes.

## 2024-04-14 LAB — BASIC METABOLIC PANEL WITH GFR
BUN/Creatinine Ratio: 15 (ref 9–23)
BUN: 13 mg/dL (ref 6–20)
CO2: 27 mmol/L (ref 20–29)
Calcium: 9.1 mg/dL (ref 8.7–10.2)
Chloride: 97 mmol/L (ref 96–106)
Creatinine, Ser: 0.85 mg/dL (ref 0.57–1.00)
Glucose: 83 mg/dL (ref 70–99)
Potassium: 4.4 mmol/L (ref 3.5–5.2)
Sodium: 140 mmol/L (ref 134–144)
eGFR: 90 mL/min/1.73

## 2024-04-14 LAB — CBC WITH DIFFERENTIAL/PLATELET
Basophils Absolute: 0.1 x10E3/uL (ref 0.0–0.2)
Basos: 1 %
EOS (ABSOLUTE): 0.1 x10E3/uL (ref 0.0–0.4)
Eos: 2 %
Hematocrit: 47.8 % — ABNORMAL HIGH (ref 34.0–46.6)
Hemoglobin: 15 g/dL (ref 11.1–15.9)
Immature Grans (Abs): 0 x10E3/uL (ref 0.0–0.1)
Immature Granulocytes: 0 %
Lymphocytes Absolute: 1.8 x10E3/uL (ref 0.7–3.1)
Lymphs: 26 %
MCH: 28.3 pg (ref 26.6–33.0)
MCHC: 31.4 g/dL — ABNORMAL LOW (ref 31.5–35.7)
MCV: 90 fL (ref 79–97)
Monocytes Absolute: 0.7 x10E3/uL (ref 0.1–0.9)
Monocytes: 10 %
Neutrophils Absolute: 4.3 x10E3/uL (ref 1.4–7.0)
Neutrophils: 61 %
Platelets: 274 x10E3/uL (ref 150–450)
RBC: 5.3 x10E6/uL — ABNORMAL HIGH (ref 3.77–5.28)
RDW: 12.8 % (ref 11.7–15.4)
WBC: 7 x10E3/uL (ref 3.4–10.8)

## 2024-04-22 NOTE — Telephone Encounter (Signed)
 Spoke with patient over the phone concerning her message about worsening knee pain. She mentions her pain is worse when she is walking, and it has brought her to tears at times. I gave her the number to the sports medicine center to call today and make an appointment for evaluation. I advised her to return to the [facility] urgent care if pain is worsening in the meantime, though she states she has already been there multiple times and they told her nothing was wrong with her knee.  After discussion with attending Dr. Delores, will hold off on another course of prednisone . Advised to continue oxycodone  for pain for now given some relief with the one dose she had taken. She has 4 doses left. She will keep me updated on her status over MyChart.

## 2024-04-27 ENCOUNTER — Ambulatory Visit: Admitting: Family Medicine

## 2024-04-27 VITALS — BP 114/76 | Ht 66.0 in | Wt >= 6400 oz

## 2024-04-27 DIAGNOSIS — M1712 Unilateral primary osteoarthritis, left knee: Secondary | ICD-10-CM | POA: Diagnosis not present

## 2024-04-27 MED ORDER — METHYLPREDNISOLONE ACETATE 40 MG/ML IJ SUSP
40.0000 mg | Freq: Once | INTRAMUSCULAR | Status: AC
  Administered 2024-04-27: 40 mg via INTRA_ARTICULAR

## 2024-04-27 NOTE — Patient Instructions (Signed)
 Your pain is due to arthritis. These are the different medications you can take for this: Tylenol  500mg  1-2 tabs three times a day for pain. Voltaren  gel, capsaicin, aspercreme, or biofreeze topically up to four times a day may also help with pain. Some supplements that may help for arthritis: Boswellia extract, curcumin, pycnogenol Cortisone injections are an option - you were given this today. If cortisone injections do not help, there are different types of shots that may help but they take longer to take effect. It's important that you continue to stay active. Straight leg raises, knee extensions 3 sets of 10 once a day (add ankle weight if these become too easy). Consider physical therapy to strengthen muscles around the joint that hurts to take pressure off of the joint itself. Shoe inserts with good arch support may be helpful. Heat or ice 15 minutes at a time 3-4 times a day as needed to help with pain. Water aerobics and cycling with low resistance are the best two types of exercise for arthritis though any exercise is ok as long as it doesn't worsen the pain. Follow up with me in 1 month.

## 2024-04-27 NOTE — Progress Notes (Unsigned)
 "  PCP: Alba Sharper, MD  Patient is a 39 y.o. female here for L knee pain.  HPI Was initially a evaluated in the ED for this atraumatic knee pain on 03/24/2024.  Most of her pain was in the lateral aspect of her left knee at the time.  Recently had a LLE DVT and PE 2/2 OCP use and travel for which she is currently on Eliquis  and has been compliant.  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  Patient was prescribed prednisone  taper which helped moderate her pain and given Robaxin  however this caused her to have some dizziness.  Patient was seen by [facility] on 04/13/2024 for knee pain since it was limiting her ambulation.  She endorsed severe knee pain with swelling of the knee and ankle.  She used compression stockings for leg swelling, lidocaine  patches, and Tylenol  for pain with only temporary relief.  Never had steroid injection in her knee prior, but has had them in her foot for plantar fasciitis.  Previous treatments with methocarbamol  provided temporary relief, but caused dizziness, Meloxicam is contraindicated due to chronic Eliquis  use.  She hasn't been able to ambulate much and knows she has to lose weight, but has difficulty doing so due to the pain.  Patient had stopped going to therapy due to being hospitalized for a DVT.  Patient was provided with a small amount of oxycodone  to help with the pain, this helped her sleep through the night, but otherwise didn't help much.  Patient was referred to sports medicine for possible left knee steroid injection under ultrasound guidance.  Due to her knee pain, she endorses worsening of her plantar fasciitis primarily in her L foot.  Past Medical History:  Diagnosis Date   Ankle ulcer (HCC)    right   Birth control counseling 01/25/2014   Childhood asthma    Chronic lower back pain    Encounter for counseling regarding contraception 05/03/2023   GERD (gastroesophageal reflux disease)     Headache    only when I'm hungry (10/14/2015)   History of COVID-19 12/22/2019   History of iritis 10/08/2013   Hypertension    Obesity    Pap smear for cervical cancer screening 01/06/2018   Varicose veins    VSD (ventricular septal defect)    had hole in bottom of her heart; closed up on it's own; no OR /mom 10/14/2015)    Medications Ordered Prior to Encounter[1]  Past Surgical History:  Procedure Laterality Date   HERNIA REPAIR     INSERTION OF MESH N/A 10/14/2015   Procedure: INSERTION OF MESH;  Surgeon: Krystal Russell, MD;  Location: [facility] OR;  Service: General;  Laterality: N/A;   LAPAROSCOPIC INCISIONAL / UMBILICAL / VENTRAL HERNIA REPAIR  10/14/2015   VHR w/mesh   TONSILLECTOMY AND ADENOIDECTOMY  1990s   TYMPANOSTOMY TUBE PLACEMENT Bilateral 1990s   VEIN LIGATION AND STRIPPING Right 12/15/2014   Procedure: SAPHENOUS VEIN LIGATION AND STRIPPING; GREATER THAN 20 STAB PHLEBECTOMIES;  Surgeon: Krystal JULIANNA Doing, MD;  Location: [facility] OR;  Service: Vascular;  Laterality: Right;   VENTRAL HERNIA REPAIR N/A 10/14/2015   Procedure: LAPAROSCOPIC VENTRAL HERNIA;  Surgeon: Krystal Russell, MD;  Location: [facility] OR;  Service: General;  Laterality: N/A;    Allergies[2]  BP 114/76   Ht 5' 6 (1.676 m)   Wt (!) 406 lb (184.2 kg)   BMI 65.53 kg/m       No data to display  No data to display              Objective:  Physical Exam:  Bilateral Knee Exam Gen: NAD, comfortable in exam room Inspection: No visible lesions, edema, erythema, overlying skin changes  Palpation: No TTP over R knee.  L knee with TTP over medial and lateral joint lines.  No tenderness over patella bilaterally. ROM: Right knee with FROM.  Left knee with pain with flexion, relieved by extension. Special Tests: Negative anterior/posterior drawer.  Mild pain with varus testing on the L knee vs R.  Negative valgus testing bilaterally. Strength: 5/5 strength NVI  Assessment and Plan:   Assessment  & Plan Arthritis of left knee Left knee pain since beginning of December 2025. Her body habitus is likely heavily contributing to her knee pain and now is causing her plantar fasciitis flare as well.  No concern for ligamentous or meniscal pathology per exam.  Pain seems to be secondary to some mild to moderate arthritis especially in the lateral compartment, as seen on 4 view left knee XR on 03/24/2024. - Discussed conservative options including PT, cortisone injections, topical Voltaren  gel, and Tylenol .  Advised avoiding NSAIDs due to history of being on anticoagulation for DVT/PE.  Patient would like to proceed with cortisone injection which was performed today as noted below. - Recommended exercise as tolerated to help with weight loss and strengthening the muscles around her joints - Recommended shoe inserts with good arch support to help with ambulation issues and crutches were provided today - Follow-up in 1 month  Knee Injection  After informed written consent was obtained, patient was seated on exam table.  Timeout was performed.  Left knee was prepped with alcohol swab.  Under ultrasound guidance per the assistance of Dr. Cleatrice, the joint space was visualized medially.  Patient's left knee was injected intraarticularly with mixture of 1cc of depomedrol 40mg /mL and 4cc of 1% lidocaine  without epinephrine  using a medial approach.  Patient tolerated the procedure well without immediate complications.   Kathrine Melena, DO [facility]     [1]  Current Outpatient Medications on File Prior to Visit  Medication Sig Dispense Refill   albuterol  (VENTOLIN  HFA) 108 (90 Base) MCG/ACT inhaler Inhale 2 puffs into the lungs every 6 (six) hours as needed for wheezing or shortness of breath. 8 g 2   apixaban  (ELIQUIS ) 5 MG TABS tablet Take 1 tablet (5 mg total) by mouth 2 (two) times daily. 60 tablet 3   Cholecalciferol  ([employer] D3) 125 MCG (5000 UT) capsule TAKE 1 TABLET BY MOUTH ONCE A WEEK.  FOR 8 WEEKS 100 capsule 0   furosemide  (LASIX ) 40 MG tablet Take 1 tablet (40 mg total) by mouth daily. May take an additional 40 mg if weight is greater 3 lbs or more     methocarbamol  (ROBAXIN ) 500 MG tablet Take 1 tablet (500 mg total) by mouth 2 (two) times daily. 20 tablet 0   oxyCODONE  (ROXICODONE ) 5 MG immediate release tablet Take 0.5-1 tablets (2.5-5 mg total) by mouth every 6 (six) hours as needed for severe pain (pain score 7-10). 5 tablet 0   potassium chloride  (KLOR-CON  M) 10 MEQ tablet Take 1 tablet (10 mEq total) by mouth daily. 30 tablet 1   predniSONE  (STERAPRED UNI-PAK 21 TAB) 10 MG (21) TBPK tablet Take by mouth daily. Take 6 tabs by mouth daily  for 2 days, then 5 tabs for 2 days, then 4 tabs for 2 days, then 3 tabs for  2 days, 2 tabs for 2 days, then 1 tab by mouth daily for 2 days 42 tablet 0   TYLENOL  8 HOUR ARTHRITIS PAIN 650 MG CR tablet Take 650-1,300 mg by mouth every 8 (eight) hours as needed for pain.     No current facility-administered medications on file prior to visit.  [2]  Allergies Allergen Reactions   Tramadol  Other (See Comments)    Made the patient feel not like herself   "

## 2024-05-05 ENCOUNTER — Telehealth: Payer: Self-pay | Admitting: Family Medicine

## 2024-05-05 NOTE — Telephone Encounter (Signed)
 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

## 2024-05-06 ENCOUNTER — Emergency Department (HOSPITAL_COMMUNITY)
Admission: EM | Admit: 2024-05-06 | Discharge: 2024-05-07 | Disposition: A | Discharge status: Home / Self Care | Payer: Self-pay

## 2024-05-06 ENCOUNTER — Emergency Department (HOSPITAL_COMMUNITY): Payer: Self-pay

## 2024-05-06 ENCOUNTER — Other Ambulatory Visit: Payer: Self-pay

## 2024-05-06 ENCOUNTER — Encounter (HOSPITAL_COMMUNITY): Payer: Self-pay | Admitting: Emergency Medicine

## 2024-05-06 DIAGNOSIS — M79672 Pain in left foot: Secondary | ICD-10-CM | POA: Insufficient documentation

## 2024-05-06 DIAGNOSIS — Z7901 Long term (current) use of anticoagulants: Secondary | ICD-10-CM | POA: Insufficient documentation

## 2024-05-06 DIAGNOSIS — R6889 Other general symptoms and signs: Secondary | ICD-10-CM

## 2024-05-06 DIAGNOSIS — R059 Cough, unspecified: Secondary | ICD-10-CM | POA: Insufficient documentation

## 2024-05-06 DIAGNOSIS — M791 Myalgia, unspecified site: Secondary | ICD-10-CM | POA: Insufficient documentation

## 2024-05-06 MED ORDER — OXYCODONE-ACETAMINOPHEN 5-325 MG PO TABS
1.0000 | ORAL_TABLET | ORAL | Status: DC | PRN
  Administered 2024-05-06: 1 via ORAL
  Filled 2024-05-06: qty 1

## 2024-05-06 MED ORDER — OXYCODONE-ACETAMINOPHEN 5-325 MG PO TABS
1.0000 | ORAL_TABLET | Freq: Once | ORAL | Status: AC
  Administered 2024-05-06: 1 via ORAL
  Filled 2024-05-06: qty 1

## 2024-05-06 MED ORDER — OXYCODONE HCL 5 MG PO TABS: 5.0000 mg | ORAL_TABLET | Freq: Four times a day (QID) | ORAL | 0 refills | Status: AC | PRN

## 2024-05-06 NOTE — Discharge Instructions (Signed)
 As we discussed, the foot pain is likely recurrent plantar fasciitis. Follow the instructions for stretches, rest the foot and keep elevated over the next couple of days. You can take oxycodone  for pain as directed.   Treat the upper respiratory infection which is likely viral given you have no fever and your chest xray is negative with Tylenol  for any aches, over-the-counter cough medications such as Robitussin, and rest. Drink plenty of fluids.   Follow up with your Sports Medicine provider for the foot pain is it continues. See your primary care provider if your cough continues.

## 2024-05-06 NOTE — ED Triage Notes (Signed)
 Pt arrives w/ c/o left foot pain. Denies injury to foot. Been using crutches for pain. Also c/o cough and yellow sputum x 3 days.

## 2024-05-06 NOTE — ED Provider Notes (Signed)
 " [place] EMERGENCY DEPARTMENT AT [facility] Provider Note   CSN: [id number] Arrival date & time: 05/06/24  2121     Patient presents with: Foot Pain and Generalized Body Aches   [name] is a 39 y.o. female.   Patient to ED with complaint of left foot pain that started >1 week ago. She reports history of plantar fasciitis that feels the same. She also reports productive cough and body aches over the last 2-3 days. No fever. No vomiting.   The history is provided by the patient. No language interpreter was used.  Foot Pain       Prior to Admission medications  Medication Sig Start Date End Date Taking? Authorizing Provider  albuterol  (VENTOLIN  HFA) 108 (90 Base) MCG/ACT inhaler Inhale 2 puffs into the lungs every 6 (six) hours as needed for wheezing or shortness of breath. 10/21/23   Delores Suzann HERO, MD  apixaban  (ELIQUIS ) 5 MG TABS tablet Take 1 tablet (5 mg total) by mouth 2 (two) times daily. 04/07/24   Alba Sharper, MD  Cholecalciferol  (CVS D3) 125 MCG (5000 UT) capsule TAKE 1 TABLET BY MOUTH ONCE A WEEK. FOR 8 WEEKS 11/25/23   Alba Sharper, MD  furosemide  (LASIX ) 40 MG tablet Take 1 tablet (40 mg total) by mouth daily. May take an additional 40 mg if weight is greater 3 lbs or more 03/09/24   Sebastian Toribio GAILS, MD  methocarbamol  (ROBAXIN ) 500 MG tablet Take 1 tablet (500 mg total) by mouth 2 (two) times daily. 03/24/24   Dasie Faden, MD  oxyCODONE  (ROXICODONE ) 5 MG immediate release tablet Take 1 tablet (5 mg total) by mouth every 6 (six) hours as needed for severe pain (pain score 7-10). 05/06/24   Odell Balls, PA-C  potassium chloride  (KLOR-CON  M) 10 MEQ tablet Take 1 tablet (10 mEq total) by mouth daily. 03/09/24   Sebastian Toribio GAILS, MD  predniSONE  (STERAPRED UNI-PAK 21 TAB) 10 MG (21) TBPK tablet Take by mouth daily. Take 6 tabs by mouth daily  for 2 days, then 5 tabs for 2 days, then 4 tabs for 2 days, then 3 tabs for 2 days, 2 tabs for 2 days,  then 1 tab by mouth daily for 2 days 03/24/24   Dasie Faden, MD  TYLENOL  8 HOUR ARTHRITIS PAIN 650 MG CR tablet Take 650-1,300 mg by mouth every 8 (eight) hours as needed for pain.    [provider]    Allergies: Tramadol     Review of Systems  Updated Vital Signs BP (!) 134/96   Pulse 90   Temp 98.6 F (37 C) (Oral)   Resp 20   LMP 04/10/2024   SpO2 100%   Physical Exam Vitals and nursing note reviewed.  Constitutional:      General: She is not in acute distress.    Appearance: She is well-developed. She is obese. She is not ill-appearing.  Cardiovascular:     Rate and Rhythm: Normal rate and regular rhythm.     Heart sounds: No murmur heard. Pulmonary:     Effort: Pulmonary effort is normal.     Breath sounds: Normal breath sounds. No wheezing, rhonchi or rales.  Musculoskeletal:        General: Normal range of motion.     Cervical back: Normal range of motion.     Comments: Exam limited by body habitus but with this limitation, no significant swelling of the left foot is present. Tender over plantar surface. Pulses present.  Ankle nontender.   Skin:    General: Skin is warm and dry.  Neurological:     Mental Status: She is alert and oriented to person, place, and time.     (all labs ordered are listed, but only abnormal results are displayed) Labs Reviewed - No data to display  EKG: None  Radiology: DG Foot Complete Left Result Date: 05/06/2024 EXAM: 3 OR MORE VIEW(S) XRAY OF THE LEFT FOOT 05/06/2024 09:56:00 PM COMPARISON: None available. CLINICAL HISTORY: Pain Pain pain pain FINDINGS: BONES AND JOINTS: No acute fracture. No malalignment. SOFT TISSUES: Unremarkable. IMPRESSION: 1. No significant abnormality. Electronically signed by: Franky Crease MD 05/06/2024 10:06 PM EST RP Workstation: [id number]   DG Chest 2 View Result Date: 05/06/2024 EXAM: 2 VIEW(S) XRAY OF THE CHEST 05/06/2024 09:56:00 PM COMPARISON: 03/02/2024. CLINICAL HISTORY: cough cough  cough cough FINDINGS: LUNGS AND PLEURA: No focal pulmonary opacity. No pleural effusion. No pneumothorax. HEART AND MEDIASTINUM: Cardiomegaly. BONES AND SOFT TISSUES: No acute osseous abnormality. IMPRESSION: 1. No active cardiopulmonary disease. Electronically signed by: Franky Crease MD 05/06/2024 10:05 PM EST RP Workstation: [id number]     Procedures   Medications Ordered in the ED  oxyCODONE -acetaminophen  (PERCOCET/ROXICET) 5-325 MG per tablet 1 tablet (1 tablet Oral Given 05/06/24 2153)  oxyCODONE -acetaminophen  (PERCOCET/ROXICET) 5-325 MG per tablet 1 tablet (has no administration in time range)    Clinical Course as of 05/06/24 2332  Wed May 06, 2024  2322 Patient to ED with left foot pain progressively worsening. Pain is along plantar surface, h/o plantar fasciitis. No new injury. Imaging negative. Likely recurrent fasciitis. She is on Eliquis  so anti-inflammatories are not recommended. Discussed morning stretching, rest. She has crutches.   She reports productive cough and body aches for 3 days. Discussed CXR negative tonight for PNA, no fever. Likely viral process, flu-like illness, requiring supportive care.   H/O PE. Tonight without hypoxia, tachycardia, tachypnea. She is compliant with her Eliquis . Do not suspect clot burden while on anticoagulation.  [SU]    Clinical Course User Index [SU] Odell Balls, PA-C                                 Medical Decision Making Amount and/or Complexity of Data Reviewed Radiology: ordered.  Risk Prescription drug management.        Final diagnoses:  Flu-like symptoms  Foot pain, left    ED Discharge Orders          Ordered    oxyCODONE  (ROXICODONE ) 5 MG immediate release tablet  Every 6 hours PRN        05/06/24 2327               Odell Balls, PA-C 05/06/24 2332  "

## 2024-05-07 ENCOUNTER — Other Ambulatory Visit: Payer: Self-pay

## 2024-05-12 ENCOUNTER — Other Ambulatory Visit: Payer: Self-pay

## 2024-05-12 DIAGNOSIS — J984 Other disorders of lung: Secondary | ICD-10-CM

## 2024-05-12 MED ORDER — ALBUTEROL SULFATE HFA 108 (90 BASE) MCG/ACT IN AERS: 2.0000 | INHALATION_SPRAY | Freq: Four times a day (QID) | RESPIRATORY_TRACT | 2 refills | Status: AC | PRN

## 2024-05-13 ENCOUNTER — Ambulatory Visit: Payer: Self-pay | Admitting: Family Medicine

## 2024-05-14 ENCOUNTER — Ambulatory Visit: Payer: Self-pay | Admitting: Family Medicine

## 2024-05-14 ENCOUNTER — Other Ambulatory Visit: Payer: Self-pay

## 2024-05-14 VITALS — BP 139/89 | Ht 66.0 in | Wt >= 6400 oz

## 2024-05-14 DIAGNOSIS — M25572 Pain in left ankle and joints of left foot: Secondary | ICD-10-CM

## 2024-05-14 DIAGNOSIS — Z7901 Long term (current) use of anticoagulants: Secondary | ICD-10-CM

## 2024-05-14 DIAGNOSIS — Z6841 Body Mass Index (BMI) 40.0 and over, adult: Secondary | ICD-10-CM

## 2024-05-14 DIAGNOSIS — M722 Plantar fascial fibromatosis: Secondary | ICD-10-CM

## 2024-05-14 DIAGNOSIS — M2142 Flat foot [pes planus] (acquired), left foot: Secondary | ICD-10-CM

## 2024-05-14 MED ORDER — PREDNISONE 10 MG PO TABS: ORAL_TABLET | ORAL | 0 refills | Status: AC

## 2024-05-14 NOTE — Progress Notes (Signed)
 DATE OF VISIT: 05/14/2024        [name] DOB: 09-29-1985 MRN: [id number]  CC: left foot pain  History of present Illness: [name] is a 39 y.o. female who presents for evaluation of left foot pain. She reports pain in the same foot approximately 7 years ago that was diagnosed as plantar fascitis and treated with a steroid injection to the heel as well as a short 2 week course of oral prednisone . She reports no pain since then until 3 weeks ago when the pain flared up again. She works as a production designer, theatre/television/film at [employer] and is therefore on her feet most of the day. She does not wear orthotics and does not do any PT exercises for her ankles or feet. The pain is reported at the heel of her left foot and it is aggravated by standing on it and walking. Has been using crutches over the last week due to pain Has not been at work over the last week due to pain Seen at 05/06/24 for flu-like illness and left foot pain - left foot xrays completed and findings as noted below - was given Rx Oxycodone       Medications:  Outpatient Encounter Medications as of 05/14/2024  Medication Sig   predniSONE  (DELTASONE ) 10 MG tablet Use as directed per doctors orders for the next 6 days.   albuterol  (VENTOLIN  HFA) 108 (90 Base) MCG/ACT inhaler Inhale 2 puffs into the lungs every 6 (six) hours as needed for wheezing or shortness of breath.   apixaban  (ELIQUIS ) 5 MG TABS tablet Take 1 tablet (5 mg total) by mouth 2 (two) times daily.   Cholecalciferol  (CVS D3) 125 MCG (5000 UT) capsule TAKE 1 TABLET BY MOUTH ONCE A WEEK. FOR 8 WEEKS   furosemide  (LASIX ) 40 MG tablet Take 1 tablet (40 mg total) by mouth daily. May take an additional 40 mg if weight is greater 3 lbs or more   methocarbamol  (ROBAXIN ) 500 MG tablet Take 1 tablet (500 mg total) by mouth 2 (two) times daily.   oxyCODONE  (ROXICODONE ) 5 MG immediate release tablet Take 1 tablet (5 mg total) by mouth every 6 (six) hours as needed for severe pain (pain score  7-10).   potassium chloride  (KLOR-CON  M) 10 MEQ tablet Take 1 tablet (10 mEq total) by mouth daily.   predniSONE  (STERAPRED UNI-PAK 21 TAB) 10 MG (21) TBPK tablet Take by mouth daily. Take 6 tabs by mouth daily  for 2 days, then 5 tabs for 2 days, then 4 tabs for 2 days, then 3 tabs for 2 days, 2 tabs for 2 days, then 1 tab by mouth daily for 2 days   TYLENOL  8 HOUR ARTHRITIS PAIN 650 MG CR tablet Take 650-1,300 mg by mouth every 8 (eight) hours as needed for pain.   No facility-administered encounter medications on file as of 05/14/2024.    Allergies: is allergic to tramadol .  Physical Examination: Vitals: BP 139/89   Ht 5' 6 (1.676 m)   Wt (!) 406 lb (184.2 kg)   LMP 04/10/2024   BMI 65.53 kg/m  GENERAL:  [name] is a 39 y.o. female appearing their stated age, alert and oriented x 3, in no apparent distress. Morbidly obese  SKIN: no rashes or lesions, skin clean, dry, intact MSK:  FOOT/ANKLE: Left foot with tenderness to palpation along plantar fascia at insertion along medial calcaneus.  No swelling.  Pes planus with overpronation.  Good achilles mobility.   Ambulating with  assistance of crutches  NEURO: sensation intact to light touch lower extremity VASC: pulses 2+ and symmetric lower extremity bilaterally  Radiology: LT FOOT XR 05/06/24 at ER personally reviewed and interpreted by me today showing: - no acute bony abnormalities - no spurring noted along calcaneus  Assessment & Plan Plantar fasciitis, left Acute left heel pain with underlying pes planus and prior hx of PF - hx and exam most c/w recurrent PF  PLAN: - imaging and ER notes reviewed - discussed MSK u/s today, but patient declined b/c too difficult for her to remove her long stockings today.  Could consider this in the future - Rx 6-day prednisone  taper to take as directed, cannot take oral NSAIDs due to chronic anticoagulation with Eliquis  - fitted with camboot to unload foot.  Can advance  weight-bearing as tolerated - work note to excuse her from work for 1 week - would likely benefit from sports insoles or custom orthotics.  Tried shoe inserts in 2018, but was too uncomfortable - do not recommend any further cortisone injections - discussed possible shockwave therapy and reviewed OOP costs of $75/session.  Should would be interested in trying this in the future. - f/u 2 weeks to re-evaluate, sooner as needed.  Consider starting shockwave at that visit if needed Pes planus of left foot Pes planus with associated Lt PF  PLAN: - would likely benefit from sports insoles or custom orthotics.  Tried shoe inserts in 2018, but was too uncomfortable - will reassess in 2 weeks Morbid obesity with BMI of 60.0-69.9, adult (HCC) Wt loss would help improve symptoms Chronic anticoagulation cannot take oral NSAIDs due to chronic anticoagulation with Eliquis  Should follow-up with specialist as scheduled, she is hoping to possibly be able to come off Eliquis .  He she stops medication would consider Rx Mobic 15mg  daily prn after completes prednisone .   Patient expressed understanding & agreement with above.  Encounter Diagnoses  Name Primary?   Plantar fasciitis, left Yes   Pes planus of left foot    Morbid obesity with BMI of 60.0-69.9, adult (HCC)    Chronic anticoagulation     Orders Placed This Encounter  Procedures   US  LIMITED JOINT SPACE STRUCTURES LOW LEFT

## 2024-05-15 ENCOUNTER — Ambulatory Visit: Payer: Self-pay | Admitting: Emergency Medicine

## 2024-05-15 ENCOUNTER — Encounter: Payer: Self-pay | Admitting: Family Medicine

## 2024-05-15 NOTE — Assessment & Plan Note (Signed)
 Wt loss would help improve symptoms

## 2024-05-15 NOTE — Progress Notes (Unsigned)
 " Cardiology Office Note:    Date:  05/15/2024  ID:  [name], DOB Dec 09, 1985, MRN [id number] PCP: Alba Sharper, MD  [facility] Providers Cardiologist:  Alm Clay, MD { Click to update primary MD,subspecialty MD or APP then REFRESH:1}    {Click to Open Review  :1}   Patient Profile:       Chief Complaint: *** History of Present Illness:  [name] is a 39 y.o. female with visit-pertinent history of childhood asthma, childhood congenital murmur, abnormal PFTs suggesting restrictive lung disease  Patient was admitted to the hospital from 5/17 through 09/03/2023 for symptoms of progressive worsening dyspnea and bilateral lower extremity edema.  CT PE protocol was negative for PE however there was evidence of volume overload and she was started on IV Lasix .  Echocardiogram showed normal LVEF with grade 1 diastolic dysfunction.  She was diuresed close to 10 L having lost 36 pounds and was feeling well.  She was discharged on Lasix  80 mg daily along with potassium.  Cardiology was not consulted.  She was referred to cardiology for right heart failure.  She established with Dr. Clay on 12/30/2023.  It was noted that it would be very unusual that she would have this level of diastolic heart failure in absence of hypertension or other cardiac history and the only grade 1 diastolic dysfunction was not bad enough to explain the level of volume overload.  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  She was now about 22 pounds from her dry weight during office visit.  She was referred to pulmonary medicine for sleep study.  She underwent echocardiogram on 02/05/2024 showing LVEF 55 to 60%, no RWMA, mild LVH, normal diastolic parameters, interventricular septum is flattened in systole consistent with right ventricular pressure overload, RV function mildly reduced and severely enlarged,  severely elevated pulmonary artery pressure, moderate tricuspid valve regurgitation, mild dilation of aortic root measuring 43 mm.  She was admitted to the hospital on 11/17 through 03/08/2024.  She presented with several days of increasing shortness of breath.  She reported that she had recently started on birth control pills to help control heavy bleeding.  She was currently on the third week of her second month.  She underwent CTA which showed bilateral PEs with right heart strain.  Pulmonology was consulted but felt that she did not need thrombolytics as her right heart strain may not be all due to PE but possibly from her pulmonary hypertension due to chronic right heart failure and likely obstructive sleep apnea.  Lower extremity Dopplers were negative for DVT.  Echocardiogram showed LVEF 60 to 65%, no RWMA, intraventricular septum flattened in systole and diastole consistent with right ventricular pressure and volume overload, severely reduced right ventricular systolic function, severely enlarged right ventricular size, severely elevated pulmonary artery systolic pressure with estimated right ventricular systolic pressure of 83.9 mmHg, severely dilated right atrial size, trivial MVR, mild to moderate TVR.  She was advised on discontinuation of hormone pills.  Her blood pressure was low during admission and she was discharged half her home dose of furosemide  at 40 mg daily.  Discussed the use of AI scribe software for clinical note transcription with the patient, who gave verbal consent to proceed.  History of Present Illness     Review of systems:  Please see the history of present illness. All other systems are reviewed and otherwise negative. ***  Studies Reviewed:        ***  Risk Assessment/Calculations:   {Does this patient have ATRIAL FIBRILLATION?:[id number]}     STOP-Bang Score:  4  { Consider Dx Sleep Disordered Breathing or Sleep Apnea  ICD G47.33          :1}      Physical Exam:   VS:  LMP 04/10/2024    Wt Readings from Last 3 Encounters:  05/14/24 (!) 406 lb (184.2 kg)  04/27/24 (!) 406 lb (184.2 kg)  04/13/24 (!) 406 lb 6.4 oz (184.3 kg)    GEN: Well nourished, well developed in no acute distress NECK: No JVD; No carotid bruits CARDIAC: ***RRR, no murmurs, rubs, gallops RESPIRATORY:  Clear to auscultation without rales, wheezing or rhonchi  ABDOMEN: Soft, non-tender, non-distended EXTREMITIES:  No edema; No acute deformity ***      Assessment and Plan:    Assessment and Plan Assessment & Plan      {Are you ordering a CV Procedure (e.g. stress test, cath, DCCV, TEE, etc)?   Press F2        :[id number]}  Dispo:  No follow-ups on file.  Signed, Lum LITTIE Louis, NP  "

## 2024-06-03 ENCOUNTER — Ambulatory Visit: Admitting: Family Medicine

## 2024-06-09 ENCOUNTER — Ambulatory Visit: Payer: Self-pay | Admitting: Emergency Medicine

## 2024-08-05 ENCOUNTER — Ambulatory Visit: Admitting: Pulmonary Disease
